# Patient Record
Sex: Female | Born: 1950
Health system: Southern US, Community
[De-identification: ages and names within clinical notes are randomized; demographics above are authoritative.]

## PROBLEM LIST (undated history)

## (undated) DIAGNOSIS — E079 Disorder of thyroid, unspecified: Secondary | ICD-10-CM

## (undated) DIAGNOSIS — I10 Essential (primary) hypertension: Secondary | ICD-10-CM

## (undated) DIAGNOSIS — I499 Cardiac arrhythmia, unspecified: Secondary | ICD-10-CM

## (undated) DIAGNOSIS — N183 Chronic kidney disease, stage 3 unspecified: Secondary | ICD-10-CM

## (undated) DIAGNOSIS — L4050 Arthropathic psoriasis, unspecified: Secondary | ICD-10-CM

## (undated) DIAGNOSIS — M1990 Unspecified osteoarthritis, unspecified site: Secondary | ICD-10-CM

## (undated) DIAGNOSIS — Z8719 Personal history of other diseases of the digestive system: Secondary | ICD-10-CM

## (undated) DIAGNOSIS — Z79899 Other long term (current) drug therapy: Secondary | ICD-10-CM

## (undated) DIAGNOSIS — K649 Unspecified hemorrhoids: Secondary | ICD-10-CM

## (undated) DIAGNOSIS — G709 Myoneural disorder, unspecified: Secondary | ICD-10-CM

## (undated) DIAGNOSIS — G894 Chronic pain syndrome: Secondary | ICD-10-CM

## (undated) DIAGNOSIS — G43909 Migraine, unspecified, not intractable, without status migrainosus: Secondary | ICD-10-CM

## (undated) DIAGNOSIS — R768 Other specified abnormal immunological findings in serum: Secondary | ICD-10-CM

## (undated) DIAGNOSIS — R0609 Other forms of dyspnea: Secondary | ICD-10-CM

## (undated) DIAGNOSIS — H269 Unspecified cataract: Secondary | ICD-10-CM

## (undated) DIAGNOSIS — Z87442 Personal history of urinary calculi: Secondary | ICD-10-CM

## (undated) DIAGNOSIS — I700 Atherosclerosis of aorta: Secondary | ICD-10-CM

## (undated) DIAGNOSIS — E039 Hypothyroidism, unspecified: Secondary | ICD-10-CM

## (undated) DIAGNOSIS — M359 Systemic involvement of connective tissue, unspecified: Secondary | ICD-10-CM

## (undated) DIAGNOSIS — T7840XA Allergy, unspecified, initial encounter: Secondary | ICD-10-CM

## (undated) DIAGNOSIS — J45909 Unspecified asthma, uncomplicated: Secondary | ICD-10-CM

## (undated) DIAGNOSIS — F1190 Opioid use, unspecified, uncomplicated: Secondary | ICD-10-CM

## (undated) DIAGNOSIS — M069 Rheumatoid arthritis, unspecified: Secondary | ICD-10-CM

## (undated) DIAGNOSIS — M109 Gout, unspecified: Secondary | ICD-10-CM

## (undated) DIAGNOSIS — T300 Burn of unspecified body region, unspecified degree: Secondary | ICD-10-CM

## (undated) DIAGNOSIS — E782 Mixed hyperlipidemia: Secondary | ICD-10-CM

## (undated) DIAGNOSIS — D649 Anemia, unspecified: Secondary | ICD-10-CM

## (undated) DIAGNOSIS — Z8489 Family history of other specified conditions: Secondary | ICD-10-CM

## (undated) DIAGNOSIS — Z972 Presence of dental prosthetic device (complete) (partial): Secondary | ICD-10-CM

## (undated) DIAGNOSIS — N189 Chronic kidney disease, unspecified: Secondary | ICD-10-CM

## (undated) DIAGNOSIS — D631 Anemia in chronic kidney disease: Secondary | ICD-10-CM

## (undated) DIAGNOSIS — K08409 Partial loss of teeth, unspecified cause, unspecified class: Secondary | ICD-10-CM

## (undated) DIAGNOSIS — L409 Psoriasis, unspecified: Secondary | ICD-10-CM

## (undated) DIAGNOSIS — T8859XA Other complications of anesthesia, initial encounter: Secondary | ICD-10-CM

## (undated) DIAGNOSIS — K259 Gastric ulcer, unspecified as acute or chronic, without hemorrhage or perforation: Secondary | ICD-10-CM

## (undated) DIAGNOSIS — R7689 Other specified abnormal immunological findings in serum: Secondary | ICD-10-CM

## (undated) DIAGNOSIS — K219 Gastro-esophageal reflux disease without esophagitis: Secondary | ICD-10-CM

## (undated) DIAGNOSIS — Z7960 Long term (current) use of unspecified immunomodulators and immunosuppressants: Secondary | ICD-10-CM

## (undated) DIAGNOSIS — M51369 Other intervertebral disc degeneration, lumbar region without mention of lumbar back pain or lower extremity pain: Secondary | ICD-10-CM

## (undated) DIAGNOSIS — M5136 Other intervertebral disc degeneration, lumbar region: Secondary | ICD-10-CM

## (undated) DIAGNOSIS — H409 Unspecified glaucoma: Secondary | ICD-10-CM

## (undated) DIAGNOSIS — R0600 Dyspnea, unspecified: Secondary | ICD-10-CM

## (undated) HISTORY — PX: COLONOSCOPY: SHX174

## (undated) HISTORY — PX: ANKLE SURGERY: SHX546

## (undated) HISTORY — PX: JOINT REPLACEMENT: SHX530

## (undated) HISTORY — DX: Gastro-esophageal reflux disease without esophagitis: K21.9

## (undated) HISTORY — DX: Unspecified hemorrhoids: K64.9

## (undated) HISTORY — DX: Anemia, unspecified: D64.9

## (undated) HISTORY — PX: ROTATOR CUFF REPAIR: SHX139

## (undated) HISTORY — PX: TONSILLECTOMY: SUR1361

## (undated) HISTORY — DX: Partial loss of teeth, unspecified cause, unspecified class: K08.409

## (undated) HISTORY — DX: Burn of unspecified body region, unspecified degree: T30.0

## (undated) HISTORY — DX: Disorder of thyroid, unspecified: E07.9

## (undated) HISTORY — DX: Unspecified osteoarthritis, unspecified site: M19.90

## (undated) HISTORY — DX: Unspecified glaucoma: H40.9

## (undated) HISTORY — PX: TUBAL LIGATION: SHX77

## (undated) HISTORY — DX: Gastric ulcer, unspecified as acute or chronic, without hemorrhage or perforation: K25.9

## (undated) HISTORY — DX: Allergy, unspecified, initial encounter: T78.40XA

## (undated) HISTORY — DX: Unspecified cataract: H26.9

## (undated) HISTORY — PX: CARPAL TUNNEL RELEASE: SHX101

## (undated) HISTORY — PX: UPPER GI ENDOSCOPY: SHX6162

## (undated) MED FILL — Iron Sucrose Inj 20 MG/ML (Fe Equiv): INTRAVENOUS | Qty: 10 | Status: AC

---

## 1993-02-24 HISTORY — PX: ROTATOR CUFF REPAIR: SHX139

## 2003-08-17 ENCOUNTER — Other Ambulatory Visit: Payer: Self-pay

## 2004-03-05 ENCOUNTER — Emergency Department: Payer: Self-pay | Admitting: General Practice

## 2004-10-17 ENCOUNTER — Ambulatory Visit: Payer: Self-pay | Admitting: Internal Medicine

## 2005-03-08 ENCOUNTER — Emergency Department: Payer: Self-pay | Admitting: Emergency Medicine

## 2006-03-05 ENCOUNTER — Ambulatory Visit: Payer: Self-pay | Admitting: Internal Medicine

## 2006-12-01 ENCOUNTER — Ambulatory Visit: Payer: Self-pay | Admitting: Unknown Physician Specialty

## 2006-12-12 ENCOUNTER — Emergency Department: Payer: Self-pay | Admitting: Emergency Medicine

## 2006-12-12 ENCOUNTER — Other Ambulatory Visit: Payer: Self-pay

## 2007-01-06 ENCOUNTER — Ambulatory Visit: Payer: Self-pay | Admitting: Pain Medicine

## 2007-03-09 ENCOUNTER — Ambulatory Visit: Payer: Self-pay | Admitting: Internal Medicine

## 2007-08-25 ENCOUNTER — Other Ambulatory Visit: Payer: Self-pay

## 2007-08-25 ENCOUNTER — Emergency Department: Payer: Self-pay | Admitting: Emergency Medicine

## 2007-09-20 ENCOUNTER — Ambulatory Visit: Payer: Self-pay | Admitting: Pain Medicine

## 2007-09-23 ENCOUNTER — Ambulatory Visit: Payer: Self-pay | Admitting: Pain Medicine

## 2007-10-07 ENCOUNTER — Ambulatory Visit: Payer: Self-pay | Admitting: Physician Assistant

## 2007-12-27 ENCOUNTER — Ambulatory Visit: Payer: Self-pay | Admitting: Unknown Physician Specialty

## 2008-01-03 ENCOUNTER — Inpatient Hospital Stay: Payer: Self-pay | Admitting: Unknown Physician Specialty

## 2008-01-08 ENCOUNTER — Encounter: Payer: Self-pay | Admitting: Internal Medicine

## 2008-04-19 ENCOUNTER — Ambulatory Visit: Payer: Self-pay | Admitting: Internal Medicine

## 2008-08-13 ENCOUNTER — Emergency Department: Payer: Self-pay | Admitting: Emergency Medicine

## 2009-02-20 ENCOUNTER — Ambulatory Visit: Payer: Self-pay | Admitting: Unknown Physician Specialty

## 2009-05-09 ENCOUNTER — Ambulatory Visit: Payer: Self-pay | Admitting: Internal Medicine

## 2009-06-24 ENCOUNTER — Ambulatory Visit: Payer: Self-pay | Admitting: Unknown Physician Specialty

## 2009-07-03 ENCOUNTER — Ambulatory Visit: Payer: Self-pay | Admitting: Unknown Physician Specialty

## 2009-07-10 ENCOUNTER — Inpatient Hospital Stay: Payer: Self-pay | Admitting: Unknown Physician Specialty

## 2009-08-03 ENCOUNTER — Emergency Department: Payer: Self-pay | Admitting: Unknown Physician Specialty

## 2009-10-26 ENCOUNTER — Ambulatory Visit: Payer: Self-pay | Admitting: Unknown Physician Specialty

## 2010-08-12 ENCOUNTER — Ambulatory Visit: Payer: Self-pay | Admitting: Anesthesiology

## 2010-09-25 ENCOUNTER — Ambulatory Visit: Payer: Self-pay | Admitting: Internal Medicine

## 2010-11-11 ENCOUNTER — Ambulatory Visit: Payer: Self-pay | Admitting: Podiatry

## 2010-12-26 ENCOUNTER — Ambulatory Visit: Payer: Self-pay | Admitting: Unknown Physician Specialty

## 2010-12-26 DIAGNOSIS — I10 Essential (primary) hypertension: Secondary | ICD-10-CM

## 2011-01-02 ENCOUNTER — Inpatient Hospital Stay: Payer: Self-pay | Admitting: Unknown Physician Specialty

## 2011-05-09 ENCOUNTER — Ambulatory Visit: Payer: Self-pay | Admitting: Unknown Physician Specialty

## 2012-01-05 ENCOUNTER — Ambulatory Visit: Payer: Self-pay | Admitting: Podiatry

## 2012-01-26 ENCOUNTER — Ambulatory Visit: Payer: Self-pay | Admitting: Podiatry

## 2012-01-26 LAB — CBC WITH DIFFERENTIAL/PLATELET
Basophil %: 0.7 %
Eosinophil #: 0.1 10*3/uL (ref 0.0–0.7)
Eosinophil %: 2.8 %
HCT: 37.6 % (ref 35.0–47.0)
HGB: 12.6 g/dL (ref 12.0–16.0)
Lymphocyte #: 1.8 10*3/uL (ref 1.0–3.6)
MCH: 28.7 pg (ref 26.0–34.0)
MCV: 86 fL (ref 80–100)
Monocyte #: 0.3 x10 3/mm (ref 0.2–0.9)
Neutrophil %: 56.1 %
RBC: 4.38 10*6/uL (ref 3.80–5.20)
RDW: 13.7 % (ref 11.5–14.5)

## 2012-01-26 LAB — BASIC METABOLIC PANEL
Anion Gap: 6 — ABNORMAL LOW (ref 7–16)
Calcium, Total: 9.1 mg/dL (ref 8.5–10.1)
Chloride: 107 mmol/L (ref 98–107)
Co2: 29 mmol/L (ref 21–32)
Osmolality: 285 (ref 275–301)
Potassium: 3.1 mmol/L — ABNORMAL LOW (ref 3.5–5.1)

## 2012-02-02 LAB — POTASSIUM: Potassium: 3.8 mmol/L (ref 3.5–5.1)

## 2012-02-03 ENCOUNTER — Inpatient Hospital Stay: Payer: Self-pay | Admitting: Podiatry

## 2012-02-03 LAB — URINALYSIS, COMPLETE
Bilirubin,UR: NEGATIVE
Blood: NEGATIVE
Glucose,UR: NEGATIVE mg/dL (ref 0–75)
Ketone: NEGATIVE
Nitrite: POSITIVE
Ph: 5 (ref 4.5–8.0)
Protein: NEGATIVE
RBC,UR: 3 /HPF (ref 0–5)
Specific Gravity: 1.015 (ref 1.003–1.030)
Squamous Epithelial: 14
WBC UR: 74 /HPF (ref 0–5)

## 2012-02-04 LAB — CREATININE, SERUM
Creatinine: 0.98 mg/dL (ref 0.60–1.30)
EGFR (African American): >60
EGFR (Non-African Amer.): >60

## 2012-02-04 LAB — PATHOLOGY REPORT

## 2012-08-23 ENCOUNTER — Ambulatory Visit: Payer: Self-pay | Admitting: Internal Medicine

## 2013-06-09 ENCOUNTER — Ambulatory Visit: Payer: Self-pay | Admitting: Emergency Medicine

## 2013-12-27 ENCOUNTER — Emergency Department: Payer: Self-pay | Admitting: Emergency Medicine

## 2013-12-27 LAB — COMPREHENSIVE METABOLIC PANEL
ALBUMIN: 3.3 g/dL — AB (ref 3.4–5.0)
ALK PHOS: 91 U/L
ANION GAP: 6 — AB (ref 7–16)
AST: 21 U/L (ref 15–37)
BUN: 25 mg/dL — ABNORMAL HIGH (ref 7–18)
Bilirubin,Total: 0.3 mg/dL (ref 0.2–1.0)
Calcium, Total: 8.9 mg/dL (ref 8.5–10.1)
Chloride: 107 mmol/L (ref 98–107)
Co2: 29 mmol/L (ref 21–32)
Creatinine: 1.63 mg/dL — ABNORMAL HIGH (ref 0.60–1.30)
EGFR (African American): 41 — ABNORMAL LOW
EGFR (Non-African Amer.): 34 — ABNORMAL LOW
Glucose: 107 mg/dL — ABNORMAL HIGH (ref 65–99)
Osmolality: 288 (ref 275–301)
Potassium: 3.8 mmol/L (ref 3.5–5.1)
SGPT (ALT): 19 U/L
Sodium: 142 mmol/L (ref 136–145)
Total Protein: 7.2 g/dL (ref 6.4–8.2)

## 2013-12-27 LAB — URINALYSIS, COMPLETE
Bilirubin,UR: NEGATIVE
Blood: NEGATIVE
Glucose,UR: NEGATIVE mg/dL (ref 0–75)
Ketone: NEGATIVE
NITRITE: NEGATIVE
Ph: 6 (ref 4.5–8.0)
Protein: NEGATIVE
RBC,UR: 1 /HPF (ref 0–5)
SPECIFIC GRAVITY: 1.008 (ref 1.003–1.030)

## 2013-12-27 LAB — CBC
HCT: 29.5 % — AB (ref 35.0–47.0)
HGB: 9.5 g/dL — AB (ref 12.0–16.0)
MCH: 27.8 pg (ref 26.0–34.0)
MCHC: 32.4 g/dL (ref 32.0–36.0)
MCV: 86 fL (ref 80–100)
Platelet: 214 10*3/uL (ref 150–440)
RBC: 3.43 10*6/uL — AB (ref 3.80–5.20)
RDW: 13.6 % (ref 11.5–14.5)
WBC: 6.8 10*3/uL (ref 3.6–11.0)

## 2013-12-27 LAB — TROPONIN I: Troponin-I: <0.02 ng/mL

## 2013-12-27 LAB — D-DIMER(ARMC): D-DIMER: 551 ng/mL

## 2013-12-27 LAB — MAGNESIUM: MAGNESIUM: 2.2 mg/dL

## 2013-12-27 LAB — CK TOTAL AND CKMB (NOT AT ARMC)
CK, Total: 154 U/L
CK-MB: 0.8 ng/mL (ref 0.5–3.6)

## 2013-12-28 DIAGNOSIS — R0609 Other forms of dyspnea: Secondary | ICD-10-CM | POA: Insufficient documentation

## 2014-01-13 DIAGNOSIS — I5030 Unspecified diastolic (congestive) heart failure: Secondary | ICD-10-CM

## 2014-01-13 HISTORY — DX: Unspecified diastolic (congestive) heart failure: I50.30

## 2014-04-07 ENCOUNTER — Emergency Department: Payer: Self-pay | Admitting: Emergency Medicine

## 2014-04-11 ENCOUNTER — Encounter: Payer: Self-pay | Admitting: *Deleted

## 2014-04-19 ENCOUNTER — Encounter: Payer: Self-pay | Admitting: General Surgery

## 2014-04-19 ENCOUNTER — Ambulatory Visit (INDEPENDENT_AMBULATORY_CARE_PROVIDER_SITE_OTHER): Payer: Medicare Other | Admitting: General Surgery

## 2014-04-19 ENCOUNTER — Other Ambulatory Visit: Payer: Self-pay | Admitting: General Surgery

## 2014-04-19 VITALS — BP 120/80 | HR 72 | Resp 14 | Ht 72.0 in | Wt 269.0 lb

## 2014-04-19 DIAGNOSIS — R1310 Dysphagia, unspecified: Secondary | ICD-10-CM | POA: Diagnosis not present

## 2014-04-19 MED ORDER — METOCLOPRAMIDE HCL 10 MG PO TABS: ORAL_TABLET | ORAL | Status: DC

## 2014-04-19 MED ORDER — POLYETHYLENE GLYCOL 3350 17 GM/SCOOP PO POWD: ORAL | Status: DC

## 2014-04-19 NOTE — Progress Notes (Signed)
Patient ID: Michele Moore, female   DOB: 11-Jul-1950, 64 y.o.   MRN: [id number]  Chief Complaint  Patient presents with  . Other    esophageal stricture    HPI Michele Moore is a 64 y.o. female.  Here today for evaluation of esophageal stricture. She complains that she has had difficulty with swallowing since November 2015. She states she has difficulty with swallowing water and solid foods. She has severe acid reflux that has gotten worse. She has vomiting because the reflux so bad. She states she has had black looking stools and coughing up blood. She has mentioned this to Dr. Brynda Greathouse in regards to this. She has had a couple of passing out episodes with dizziness. The most recent episode was in November 2015. She was sent to the ER to be evaluated.    HPI  Past Medical History  Diagnosis Date  . Arthritis   . Thyroid disease   . Anemia   . GERD (gastroesophageal reflux disease)   . Hemorrhoid   . Multiple gastric ulcers     Past Surgical History  Procedure Laterality Date  . Joint replacement      knee x 3  . Carpal tunnel release      x3  . Rotator cuff repair    . Ankle surgery    . Tubal ligation    . Colonoscopy  2000?  Marland Kitchen Upper gi endoscopy  2000?    Family History  Problem Relation Age of Onset  . COPD Sister   . Cancer Maternal Aunt     breast  . Cancer Maternal Uncle     kidney    Social History History  Substance Use Topics  . Smoking status: Former Smoker    Quit date: 02/24/1993  . Smokeless tobacco: Never Used  . Alcohol Use: No    Allergies  Allergen Reactions  . Penicillins Anaphylaxis  . Vibramycin [Doxycycline Calcium] Rash    Current Outpatient Prescriptions  Medication Sig Dispense Refill  . atenolol (TENORMIN) 100 MG tablet Take 100 mg by mouth daily.   5  . cyclobenzaprine (FLEXERIL) 10 MG tablet Take 10 mg by mouth 3 (three) times daily.   1  . HYDROcodone-acetaminophen (NORCO/VICODIN) 5-325 MG per tablet Take 1 tablet by mouth 3 (three)  times daily.   0  . meloxicam (MOBIC) 15 MG tablet Take 15 mg by mouth daily.   3  . omega-3 acid ethyl esters (LOVAZA) 1 G capsule Take 2 g by mouth 2 (two) times daily.   5  . omeprazole (PRILOSEC) 40 MG capsule Take 40 mg by mouth 2 (two) times daily.   0  . polyethylene glycol powder (GLYCOLAX/MIRALAX) powder   3  . potassium chloride SA (K-DUR,KLOR-CON) 20 MEQ tablet Take 40 mEq by mouth daily.   5  . ranitidine (ZANTAC) 150 MG tablet Take 150 mg by mouth 2 (two) times daily.   4  . SYNTHROID 150 MCG tablet Take 150 mcg by mouth daily before breakfast.   5  . triamterene-hydrochlorothiazide (MAXZIDE) 75-50 MG per tablet Take 1 tablet by mouth daily.   4   No current facility-administered medications for this visit.    Review of Systems Review of Systems  Constitutional: Negative.   Respiratory: Positive for cough.   Gastrointestinal: Positive for nausea and vomiting.    Blood pressure 120/80, pulse 72, resp. rate 14, height 6' (1.829 m), weight 269 lb (122.018 kg).  Physical Exam Physical Exam  Constitutional:  She is oriented to person, place, and time. She appears well-developed and well-nourished.  Eyes: Conjunctivae are normal. No scleral icterus.  Neck: Neck supple. No thyromegaly present.  Cardiovascular: Normal rate, regular rhythm and normal heart sounds.   No murmur heard. Pulmonary/Chest: Effort normal and breath sounds normal.  Abdominal: Soft. Bowel sounds are normal.  Lymphadenopathy:    She has no cervical adenopathy.  Neurological: She is alert and oriented to person, place, and time.  Skin: Skin is warm and dry.    Data Reviewed  Dr. Jerene Dilling notes.   Assessment    Dysphagia. Complain of black stools.     Plan    Patient to be scheduled for an upper endoscopy and colonoscopy. Patient given hemoccult cards to complete and return to our office.     Patient has been scheduled for a colonoscopy on 05-02-14 at [facility]. This patient has been asked to  discontinue fish oil one week prior to procedure.   Terence Bart G 04/19/2014, 3:48 PM

## 2014-04-19 NOTE — Patient Instructions (Addendum)
Patient given hemoccult cards to complete and return to our office. She is also going to be arranged for a colonoscopy and upper endoscopy. The patient is aware to call back for any questions or concerns.    Colonoscopy A colonoscopy is an exam to look at the entire large intestine (colon). This exam can help find problems such as tumors, polyps, inflammation, and areas of bleeding. The exam takes about 1 hour.  LET [facility] CARE PROVIDER KNOW ABOUT:   Any allergies you have.  All medicines you are taking, including vitamins, herbs, eye drops, creams, and over-the-counter medicines.  Previous problems you or members of your family have had with the use of anesthetics.  Any blood disorders you have.  Previous surgeries you have had.  Medical conditions you have. RISKS AND COMPLICATIONS  Generally, this is a safe procedure. However, as with any procedure, complications can occur. Possible complications include:  Bleeding.  Tearing or rupture of the colon wall.  Reaction to medicines given during the exam.  Infection (rare). BEFORE THE PROCEDURE   Ask your health care provider about changing or stopping your regular medicines.  You may be prescribed an oral bowel prep. This involves drinking a large amount of medicated liquid, starting the day before your procedure. The liquid will cause you to have multiple loose stools until your stool is almost clear or light green. This cleans out your colon in preparation for the procedure.  Do not eat or drink anything else once you have started the bowel prep, unless your health care provider tells you it is safe to do so.  Arrange for someone to drive you home after the procedure. PROCEDURE   You will be given medicine to help you relax (sedative).  You will lie on your side with your knees bent.  A long, flexible tube with a light and camera on the end (colonoscope) will be inserted through the rectum and into the colon. The  camera sends video back to a computer screen as it moves through the colon. The colonoscope also releases carbon dioxide gas to inflate the colon. This helps your health care provider see the area better.  During the exam, your health care provider may take a small tissue sample (biopsy) to be examined under a microscope if any abnormalities are found.  The exam is finished when the entire colon has been viewed. AFTER THE PROCEDURE   Do not drive for 24 hours after the exam.  You may have a small amount of blood in your stool.  You may pass moderate amounts of gas and have mild abdominal cramping or bloating. This is caused by the gas used to inflate your colon during the exam.  Ask when your test results will be ready and how you will get your results. Make sure you get your test results. Document Released: 02/08/2000 Document Revised: 12/01/2012 Document Reviewed: 10/18/2012 [employer] [facility] Patient Information 2015 [place], [place]. This information is not intended to replace advice given to you by your health care provider. Make sure you discuss any questions you have with your health care provider.  Patient has been scheduled for a colonoscopy on 05-02-14 at [facility]. This patient has been asked to discontinue fish oil one week prior to procedure.

## 2014-04-27 ENCOUNTER — Ambulatory Visit (INDEPENDENT_AMBULATORY_CARE_PROVIDER_SITE_OTHER): Payer: Medicare Other | Admitting: *Deleted

## 2014-04-27 DIAGNOSIS — K921 Melena: Secondary | ICD-10-CM

## 2014-04-27 LAB — POC HEMOCCULT BLD/STL (HOME/3-CARD/SCREEN)
Card #2 Fecal Occult Blod, POC: NEGATIVE
Card #3 Fecal Occult Blood, POC: POSITIVE
Fecal Occult Blood, POC: NEGATIVE

## 2014-04-27 NOTE — Progress Notes (Signed)
Stool dated 04-26-14 positive other stools negative, Dr Jamal Collin aware.

## 2014-04-27 NOTE — Patient Instructions (Signed)
As scheduled

## 2014-04-28 ENCOUNTER — Telehealth: Payer: Self-pay

## 2014-04-28 NOTE — Telephone Encounter (Signed)
Patient called and states that she has a very bad cold and sore throat. She is rescheduling her upper and lower endoscopy at [facility] to 05/16/14. She is aware to pre register with the hospital at least 2 days prior. She will be seeing her PCP on 05/02/14 for her cold and sore throat symptoms. Patient is aware of date and all instructions.

## 2014-05-16 ENCOUNTER — Ambulatory Visit: Payer: Self-pay | Admitting: General Surgery

## 2014-05-16 DIAGNOSIS — Z1211 Encounter for screening for malignant neoplasm of colon: Secondary | ICD-10-CM

## 2014-05-16 DIAGNOSIS — R1310 Dysphagia, unspecified: Secondary | ICD-10-CM | POA: Diagnosis not present

## 2014-05-16 DIAGNOSIS — K259 Gastric ulcer, unspecified as acute or chronic, without hemorrhage or perforation: Secondary | ICD-10-CM | POA: Diagnosis not present

## 2014-05-17 ENCOUNTER — Encounter: Payer: Self-pay | Admitting: General Surgery

## 2014-05-22 ENCOUNTER — Encounter: Payer: Self-pay | Admitting: General Surgery

## 2014-06-13 NOTE — Op Note (Signed)
PATIENT NAME:  Michele Moore, Michele Moore MR#:  [id number] DATE OF BIRTH:  Jul 07, 1950  DATE OF PROCEDURE:  02/02/2012  PREOPERATIVE DIAGNOSIS: Right posterior tibial tendon rupture.   POSTOPERATIVE DIAGNOSIS: Right posterior tibial tendon rupture.  PROCEDURES:  1. Subtalar joint fusion, right foot.  2. Talonavicular joint fusion, right foot.   SURGEON: Morrison Masser A. Vickki Muff, DPM.   ANESTHESIA: General with popliteal block.   SPECIMEN: Ruptured posterior tibial tendon, right foot.   HEMOSTASIS: Tourniquet inflated to 325 mmHg for 120 minutes.   COMPLICATIONS: None.   ESTIMATED BLOOD LOSS: Minimal.   OPERATIVE INDICATIONS: This is a 64 year old female who I have seen in the outpatient clinic with a complaint of painful right foot and ankle. She has undergone MRI that showed rupture over her posterior tibial tendon. We discussed different surgical options and she presents for surgery today for subtalar fusion, flexor tendon transfer, and possible talonavicular joint fusion. The risks, benefits, alternatives, and complications associated with the surgery were discussed with the patient and informed consent has been given.   OPERATIVE PROCEDURE: The patient was brought into the Operating Room and placed on the operating table in the supine position. General intubation was administered by the Anesthesia team after a popliteal block had been placed. The patient was placed in the supine position. The right lower extremity was then prepped and draped in the usual sterile fashion. Attention was directed laterally where after inflation of the tourniquet a lateral incision was made over the subtalar joint region. Sharp and blunt dissection was carried down to the deep layers. The EDB muscle belly was noted and transected away from the subtalar joint. This was then entered. The subtalar joint was then removed of all articular cartilage from the posterior middle and anterior facets. This was then drilled with a 1.5 mm drill  bit. Next, the foot was then placed in rectus position, neutral to the leg, and stabilized with a guidewire for the 80 mm DePuy screw set. Next, an 80 millimeter screw was then placed from the heel with normal technique. Excellent compression was noted. The sinus tarsi area was then filled with DBM bone graft. The skin was then closed in layers with 3-0 Vicryl for the deeper and subcutaneous tissue and a 3-0 nylon for the skin. Attention was then directed to the medial aspect of the ankle where a longitudinal incision was made from just around the medial ankle coursing to the TM joint. Sharp and blunt dissection was carried down to the posterior tibial tendon sheath. This was then entered. There was noted to be severe destruction of the posterior tibial tendon at its insertion. Deep to this, the spring ligament was basically completely destroyed with no fragments remaining to suture back. At this point, I elected to excise the posterior tibial tendon and perform a talonavicular fusion. The joint was then prepped with the use of curettes, was burred with a power bur, and then drilled with a 1.5 mm drill bit down to good healthy bleeding bone. A 4.0 mm screw was placed across the tympanic membrane joint initially. A second 18 mm staple was then placed in the central dorsal portion for further compression and stability. This was noted to be quite stable. A little bit of remaining bone graft was placed around the joint, though the joint was quite tight at this time. The wound was flushed with copious amounts of irrigation. Layered closure was performed with a 3-0 Vicryl for the deeper and subcutaneous tissues and a 3-0 nylon for  the skin. A 0.5% Marcaine block was placed around the ankle and lateral foot. She was placed in a well compressive sterile dressing and will be admitted for 23-hour short stay for evaluation. She will be instructed on nonweightbearing. I will see her in the outpatient clinic upon discharge.    ____________________________ Pete Glatter. Vickki Muff, DPM jaf:cbb D: 02/02/2012 17:29:03 ET T: 02/02/2012 17:35:41 ET JOB#: [id number]  cc: Larkin Ina A. Vickki Muff, DPM, <Dictator> Autrey Human DPM ELECTRONICALLY SIGNED 02/20/2012 9:26

## 2014-06-19 LAB — SURGICAL PATHOLOGY

## 2014-12-21 ENCOUNTER — Other Ambulatory Visit: Payer: Self-pay | Admitting: Unknown Physician Specialty

## 2014-12-21 DIAGNOSIS — M5442 Lumbago with sciatica, left side: Secondary | ICD-10-CM

## 2015-01-04 ENCOUNTER — Ambulatory Visit
Admission: RE | Admit: 2015-01-04 | Discharge: 2015-01-04 | Disposition: A | Discharge status: Home / Self Care | Payer: Medicare Other | Source: Ambulatory Visit | Attending: Unknown Physician Specialty | Admitting: Unknown Physician Specialty

## 2015-01-04 DIAGNOSIS — M5442 Lumbago with sciatica, left side: Secondary | ICD-10-CM

## 2015-01-04 DIAGNOSIS — M4806 Spinal stenosis, lumbar region: Secondary | ICD-10-CM | POA: Diagnosis not present

## 2015-01-04 DIAGNOSIS — G544 Lumbosacral root disorders, not elsewhere classified: Secondary | ICD-10-CM | POA: Insufficient documentation

## 2015-02-21 ENCOUNTER — Emergency Department
Admission: EM | Admit: 2015-02-21 | Discharge: 2015-02-21 | Disposition: A | Discharge status: Home / Self Care | Payer: Medicare Other | Attending: Emergency Medicine | Admitting: Emergency Medicine

## 2015-02-21 ENCOUNTER — Encounter: Payer: Self-pay | Admitting: *Deleted

## 2015-02-21 ENCOUNTER — Emergency Department: Payer: Medicare Other

## 2015-02-21 DIAGNOSIS — Z79899 Other long term (current) drug therapy: Secondary | ICD-10-CM | POA: Insufficient documentation

## 2015-02-21 DIAGNOSIS — S79912A Unspecified injury of left hip, initial encounter: Secondary | ICD-10-CM | POA: Diagnosis not present

## 2015-02-21 DIAGNOSIS — S3992XA Unspecified injury of lower back, initial encounter: Secondary | ICD-10-CM | POA: Diagnosis present

## 2015-02-21 DIAGNOSIS — Y998 Other external cause status: Secondary | ICD-10-CM | POA: Insufficient documentation

## 2015-02-21 DIAGNOSIS — Z880 Allergy status to penicillin: Secondary | ICD-10-CM | POA: Diagnosis not present

## 2015-02-21 DIAGNOSIS — M5442 Lumbago with sciatica, left side: Secondary | ICD-10-CM | POA: Insufficient documentation

## 2015-02-21 DIAGNOSIS — Y9289 Other specified places as the place of occurrence of the external cause: Secondary | ICD-10-CM | POA: Insufficient documentation

## 2015-02-21 DIAGNOSIS — G8929 Other chronic pain: Secondary | ICD-10-CM | POA: Diagnosis not present

## 2015-02-21 DIAGNOSIS — Z87891 Personal history of nicotine dependence: Secondary | ICD-10-CM | POA: Diagnosis not present

## 2015-02-21 DIAGNOSIS — W010XXA Fall on same level from slipping, tripping and stumbling without subsequent striking against object, initial encounter: Secondary | ICD-10-CM | POA: Diagnosis not present

## 2015-02-21 DIAGNOSIS — I10 Essential (primary) hypertension: Secondary | ICD-10-CM | POA: Insufficient documentation

## 2015-02-21 DIAGNOSIS — Y9301 Activity, walking, marching and hiking: Secondary | ICD-10-CM | POA: Insufficient documentation

## 2015-02-21 DIAGNOSIS — W19XXXA Unspecified fall, initial encounter: Secondary | ICD-10-CM

## 2015-02-21 HISTORY — DX: Essential (primary) hypertension: I10

## 2015-02-21 MED ORDER — MELOXICAM 15 MG PO TABS: 15.0000 mg | ORAL_TABLET | Freq: Every day | ORAL | Status: DC

## 2015-02-21 MED ORDER — OXYCODONE-ACETAMINOPHEN 5-325 MG PO TABS
1.0000 | ORAL_TABLET | Freq: Once | ORAL | Status: AC
Start: 2015-02-21 — End: 2015-02-21
  Administered 2015-02-21: 1 via ORAL
  Filled 2015-02-21: qty 1

## 2015-02-21 MED ORDER — OXYCODONE-ACETAMINOPHEN 10-325 MG PO TABS: 1.0000 | ORAL_TABLET | Freq: Four times a day (QID) | ORAL | Status: DC | PRN

## 2015-02-21 NOTE — ED Notes (Signed)
Pt has chronic back pain, pt reports falling , pt complaining of left hip pain

## 2015-02-21 NOTE — Discharge Instructions (Signed)
Chronic Back Pain  When back pain lasts longer than 3 months, it is called chronic back pain.People with chronic back pain often go through certain periods that are more intense (flare-ups).  CAUSES Chronic back pain can be caused by wear and tear (degeneration) on different structures in your back. These structures include:  The bones of your spine (vertebrae) and the joints surrounding your spinal cord and nerve roots (facets).  The strong, fibrous tissues that connect your vertebrae (ligaments). Degeneration of these structures may result in pressure on your nerves. This can lead to constant pain. HOME CARE INSTRUCTIONS  Avoid bending, heavy lifting, prolonged sitting, and activities which make the problem worse.  Take brief periods of rest throughout the day to reduce your pain. Lying down or standing usually is better than sitting while you are resting.  Take over-the-counter or prescription medicines only as directed by your caregiver. SEEK IMMEDIATE MEDICAL CARE IF:   You have weakness or numbness in one of your legs or feet.  You have trouble controlling your bladder or bowels.  You have nausea, vomiting, abdominal pain, shortness of breath, or fainting.   This information is not intended to replace advice given to you by your health care provider. Make sure you discuss any questions you have with your health care provider.   Document Released: 03/20/2004 Document Revised: 05/05/2011 Document Reviewed: 07/31/2014 Elsevier Interactive Patient Education 2016 [employer]  Radicular Pain Radicular pain in either the arm or leg is usually from a bulging or herniated disk in the spine. A piece of the herniated disk may press against the nerves as the nerves exit the spine. This causes pain which is felt at the tips of the nerves down the arm or leg. Other causes of radicular pain may include:  Fractures.  Heart disease.  Cancer.  An abnormal and usually degenerative state  of the nervous system or nerves (neuropathy). Diagnosis may require CT or MRI scanning to determine the primary cause.  Nerves that start at the neck (nerve roots) may cause radicular pain in the outer shoulder and arm. It can spread down to the thumb and fingers. The symptoms vary depending on which nerve root has been affected. In most cases radicular pain improves with conservative treatment. Neck problems may require physical therapy, a neck collar, or cervical traction. Treatment may take many weeks, and surgery may be considered if the symptoms do not improve.  Conservative treatment is also recommended for sciatica. Sciatica causes pain to radiate from the lower back or buttock area down the leg into the foot. Often there is a history of back problems. Most patients with sciatica are better after 2 to 4 weeks of rest and other supportive care. Short term bed rest can reduce the disk pressure considerably. Sitting, however, is not a good position since this increases the pressure on the disk. You should avoid bending, lifting, and all other activities which make the problem worse. Traction can be used in severe cases. Surgery is usually reserved for patients who do not improve within the first months of treatment. Only take over-the-counter or prescription medicines for pain, discomfort, or fever as directed by your caregiver. Narcotics and muscle relaxants may help by relieving more severe pain and spasm and by providing mild sedation. Cold or massage can give significant relief. Spinal manipulation is not recommended. It can increase the degree of disc protrusion. Epidural steroid injections are often effective treatment for radicular pain. These injections deliver medicine to the spinal  nerve in the space between the protective covering of the spinal cord and back bones (vertebrae). Your caregiver can give you more information about steroid injections. These injections are most effective when given  within two weeks of the onset of pain.  You should see your caregiver for follow up care as recommended. A program for neck and back injury rehabilitation with stretching and strengthening exercises is an important part of management.  SEEK IMMEDIATE MEDICAL CARE IF:  You develop increased pain, weakness, or numbness in your arm or leg.  You develop difficulty with bladder or bowel control.  You develop abdominal pain.   This information is not intended to replace advice given to you by your health care provider. Make sure you discuss any questions you have with your health care provider.   Document Released: 03/20/2004 Document Revised: 03/03/2014 Document Reviewed: 09/06/2014 Elsevier Interactive Patient Education [employer].

## 2015-02-21 NOTE — ED Provider Notes (Signed)
[facility] Emergency Department Provider Note  ____________________________________________  Time seen: Approximately 6:48 PM  I have reviewed the triage vital signs and the nursing notes.   HISTORY  Chief Complaint Back Pain    HPI Michele Moore is a 64 y.o. female who presents emergency department complaining of lower back and left hip pain. Patient states that she has chronic back pain from a bulging disc. She states that 2 days prior she was walking, slipped, fell landing on her lower back and left hip region.Patient states that the pain is sharp in her lower back any burning sensation onto the left hip. She denies any paresthesias, saddle anesthesia, bowel or bladder dysfunction. She does endorse some mild numbness to the left foot. She did not hit her head or lose consciousness. She is not complaining of anything else at this time. Pain is constant, moderate to severe, and a sharp and burning sensation.   Past Medical History  Diagnosis Date  . Arthritis   . Thyroid disease   . Anemia   . GERD (gastroesophageal reflux disease)   . Hemorrhoid   . Multiple gastric ulcers   . Hypertension     There are no active problems to display for this patient.   Past Surgical History  Procedure Laterality Date  . Joint replacement      knee x 3  . Carpal tunnel release      x3  . Rotator cuff repair    . Ankle surgery    . Tubal ligation    . Colonoscopy  2000?  Marland Kitchen Upper gi endoscopy  2000?    Current Outpatient Rx  Name  Route  Sig  Dispense  Refill  . atenolol (TENORMIN) 100 MG tablet   Oral   Take 100 mg by mouth daily.       5   . cyclobenzaprine (FLEXERIL) 10 MG tablet   Oral   Take 10 mg by mouth 3 (three) times daily.       1   . HYDROcodone-acetaminophen (NORCO/VICODIN) 5-325 MG per tablet   Oral   Take 1 tablet by mouth 3 (three) times daily.       0   . meloxicam (MOBIC) 15 MG tablet   Oral   Take 1 tablet (15 mg total) by  mouth daily.   30 tablet   0   . metoCLOPramide (REGLAN) 10 MG tablet      TAKE 1 TABLET BY MOUTH ON HOUR PRIOR TO THE START OF COLONOSCOPY PREP AND 1 TABLET 3-4 HOURS LATER   2 tablet   0   . omega-3 acid ethyl esters (LOVAZA) 1 G capsule   Oral   Take 2 g by mouth 2 (two) times daily.       5   . omeprazole (PRILOSEC) 40 MG capsule   Oral   Take 40 mg by mouth 2 (two) times daily.       0   . oxyCODONE-acetaminophen (PERCOCET) 10-325 MG tablet   Oral   Take 1 tablet by mouth every 6 (six) hours as needed for pain.   20 tablet   0   . polyethylene glycol powder (GLYCOLAX/MIRALAX) powder            3   . polyethylene glycol powder (GLYCOLAX/MIRALAX) powder      255 grams one bottle for colonoscopy prep   255 g   0   . potassium chloride SA (K-DUR,KLOR-CON) 20 MEQ tablet  Oral   Take 40 mEq by mouth daily.       5   . ranitidine (ZANTAC) 150 MG tablet   Oral   Take 150 mg by mouth 2 (two) times daily.       4   . SYNTHROID 150 MCG tablet   Oral   Take 150 mcg by mouth daily before breakfast.       5     Dispense as written.   . triamterene-hydrochlorothiazide (MAXZIDE) 75-50 MG per tablet   Oral   Take 1 tablet by mouth daily.       4     Allergies Penicillins and Vibramycin  Family History  Problem Relation Age of Onset  . COPD Sister   . Cancer Maternal Aunt     breast  . Cancer Maternal Uncle     kidney    Social History Social History  Substance Use Topics  . Smoking status: Former Smoker    Quit date: 02/24/1993  . Smokeless tobacco: Never Used  . Alcohol Use: No    Review of Systems Constitutional: No fever/chills Eyes: No visual changes. ENT: No sore throat. Cardiovascular: Denies chest pain. Respiratory: Denies shortness of breath. Gastrointestinal: No abdominal pain.  No nausea, no vomiting.  No diarrhea.  No constipation. Genitourinary: Negative for dysuria. Musculoskeletal: Endorses left-sided lower back pain  and left hip pain. Skin: Negative for rash. Neurological: Negative for headaches, focal weakness or numbness.  10-point ROS otherwise negative.  ____________________________________________   PHYSICAL EXAM:  VITAL SIGNS: ED Triage Vitals  Enc Vitals Group     BP 02/21/15 1735 130/65 mmHg     Pulse Rate 02/21/15 1735 97     Resp 02/21/15 1735 20     Temp 02/21/15 1735 98.4 F (36.9 C)     Temp Source 02/21/15 1735 Oral     SpO2 02/21/15 1735 97 %     Weight 02/21/15 1735 258 lb (117.028 kg)     Height 02/21/15 1735 5\' 11"  (1.803 m)     Head Cir --      Peak Flow --      Pain Score 02/21/15 1736 10     Pain Loc --      Pain Edu? --      Excl. in [place]? --     Constitutional: Alert and oriented. Well appearing and in no acute distress. Eyes: Conjunctivae are normal. PERRL. EOMI. Head: Atraumatic. Nose: No congestion/rhinnorhea. Mouth/Throat: Mucous membranes are moist.  Oropharynx non-erythematous. Neck: No stridor.  No cervical spine tenderness to palpation. Cardiovascular: Normal rate, regular rhythm. Grossly normal heart sounds.  Good peripheral circulation. Respiratory: Normal respiratory effort.  No retractions. Lungs CTAB. Gastrointestinal: Soft and nontender. No distention. No abdominal bruits. No CVA tenderness. Musculoskeletal: No lower extremity tenderness nor edema.  No joint effusions. No visible abnormality to the lower back or left hip upon inspection. Patient is diffusely tender to palpation midline cervical processes and lumbar region. Patient is tender to palpation over the paraspinal muscle group in the lumbar region. There is pain to palpation over the sciatic notch. Patient does have full range of motion to left hip. Tender to palpation over the trochanteric region. Examination of left knee is unremarkable. Sensation and pulses are intact distally. Neurologic:  Normal speech and language. No gross focal neurologic deficits are appreciated. No gait  instability. Skin:  Skin is warm, dry and intact. No rash noted. Psychiatric: Mood and affect are normal. Speech and behavior are  normal.  ____________________________________________   LABS (all labs ordered are listed, but only abnormal results are displayed)  Labs Reviewed - No data to display ____________________________________________  EKG   ____________________________________________  RADIOLOGY  Lumbar spine x-ray Impression: No evidence of acute osseous abnormality. Lower lumbar spine degenerative changes.  Left hip x-ray Impression: Mild symmetric degenerative changes in the hips. No acute osseous abnormality.   Images were personally reviewed by myself. ____________________________________________   PROCEDURES  Procedure(s) performed: None  Critical Care performed: No  ____________________________________________   INITIAL IMPRESSION / ASSESSMENT AND PLAN / ED COURSE  Pertinent labs & imaging results that were available during my care of the patient were reviewed by me and considered in my medical decision making (see chart for details).  Patient presents emergency department status post a fall. X-rays are unremarkable for any acute osseous abnormality. Patient's nurses is consistent with lumbago with sciatica to left side that is exacerbated by this fall. Patient will be discharged home with pain medication and anti-inflammatories for symptom control. She is to follow-up with Dr. Prescott Parma for her regularly scheduled appointment in January for epidural steroid injection. She verbalizes understanding of diagnosis and treatment plan and verbalizes compliance with same. ____________________________________________   FINAL CLINICAL IMPRESSION(S) / ED DIAGNOSES  Final diagnoses:  Chronic midline low back pain with left-sided sciatica  Fall, initial encounter      Darletta Moll, PA-C 02/21/15 2005  [name], MD 02/22/15 [phone]

## 2015-03-01 DIAGNOSIS — M5136 Other intervertebral disc degeneration, lumbar region: Secondary | ICD-10-CM | POA: Diagnosis not present

## 2015-03-01 DIAGNOSIS — M5442 Lumbago with sciatica, left side: Secondary | ICD-10-CM | POA: Diagnosis not present

## 2015-03-01 DIAGNOSIS — M4806 Spinal stenosis, lumbar region: Secondary | ICD-10-CM | POA: Diagnosis not present

## 2015-03-01 DIAGNOSIS — M5416 Radiculopathy, lumbar region: Secondary | ICD-10-CM | POA: Diagnosis not present

## 2015-03-26 DIAGNOSIS — M5136 Other intervertebral disc degeneration, lumbar region: Secondary | ICD-10-CM | POA: Diagnosis not present

## 2015-03-26 DIAGNOSIS — M5432 Sciatica, left side: Secondary | ICD-10-CM | POA: Diagnosis not present

## 2015-03-26 DIAGNOSIS — M4806 Spinal stenosis, lumbar region: Secondary | ICD-10-CM | POA: Diagnosis not present

## 2015-03-26 DIAGNOSIS — M5442 Lumbago with sciatica, left side: Secondary | ICD-10-CM | POA: Diagnosis not present

## 2015-03-26 DIAGNOSIS — M25552 Pain in left hip: Secondary | ICD-10-CM | POA: Diagnosis not present

## 2015-03-26 DIAGNOSIS — M5416 Radiculopathy, lumbar region: Secondary | ICD-10-CM | POA: Diagnosis not present

## 2015-03-26 DIAGNOSIS — M545 Low back pain: Secondary | ICD-10-CM | POA: Diagnosis not present

## 2015-03-27 DIAGNOSIS — M5442 Lumbago with sciatica, left side: Secondary | ICD-10-CM | POA: Diagnosis not present

## 2015-03-27 DIAGNOSIS — M6281 Muscle weakness (generalized): Secondary | ICD-10-CM | POA: Diagnosis not present

## 2015-05-01 ENCOUNTER — Ambulatory Visit
Admission: EM | Admit: 2015-05-01 | Discharge: 2015-05-01 | Disposition: A | Discharge status: Home / Self Care | Payer: Medicare Other | Attending: Family Medicine | Admitting: Family Medicine

## 2015-05-01 ENCOUNTER — Ambulatory Visit: Payer: Medicare Other

## 2015-05-01 DIAGNOSIS — I10 Essential (primary) hypertension: Secondary | ICD-10-CM | POA: Diagnosis not present

## 2015-05-01 DIAGNOSIS — R05 Cough: Secondary | ICD-10-CM | POA: Diagnosis not present

## 2015-05-01 DIAGNOSIS — Z880 Allergy status to penicillin: Secondary | ICD-10-CM | POA: Insufficient documentation

## 2015-05-01 DIAGNOSIS — D649 Anemia, unspecified: Secondary | ICD-10-CM | POA: Diagnosis not present

## 2015-05-01 DIAGNOSIS — Z87891 Personal history of nicotine dependence: Secondary | ICD-10-CM | POA: Insufficient documentation

## 2015-05-01 DIAGNOSIS — R059 Cough, unspecified: Secondary | ICD-10-CM

## 2015-05-01 DIAGNOSIS — J189 Pneumonia, unspecified organism: Secondary | ICD-10-CM | POA: Diagnosis not present

## 2015-05-01 DIAGNOSIS — K219 Gastro-esophageal reflux disease without esophagitis: Secondary | ICD-10-CM | POA: Diagnosis not present

## 2015-05-01 DIAGNOSIS — R0602 Shortness of breath: Secondary | ICD-10-CM | POA: Diagnosis not present

## 2015-05-01 DIAGNOSIS — E079 Disorder of thyroid, unspecified: Secondary | ICD-10-CM | POA: Insufficient documentation

## 2015-05-01 DIAGNOSIS — Z7982 Long term (current) use of aspirin: Secondary | ICD-10-CM | POA: Diagnosis not present

## 2015-05-01 DIAGNOSIS — R509 Fever, unspecified: Secondary | ICD-10-CM | POA: Diagnosis not present

## 2015-05-01 DIAGNOSIS — J069 Acute upper respiratory infection, unspecified: Secondary | ICD-10-CM | POA: Diagnosis present

## 2015-05-01 MED ORDER — LEVOFLOXACIN 500 MG PO TABS: 500.0000 mg | ORAL_TABLET | Freq: Every day | ORAL | Status: DC

## 2015-05-01 MED ORDER — FEXOFENADINE-PSEUDOEPHED ER 180-240 MG PO TB24: 1.0000 | ORAL_TABLET | Freq: Every day | ORAL | Status: DC

## 2015-05-01 MED ORDER — HYDROCOD POLST-CPM POLST ER 10-8 MG/5ML PO SUER: 5.0000 mL | Freq: Two times a day (BID) | ORAL | Status: DC | PRN

## 2015-05-01 NOTE — ED Notes (Signed)
States has had sinus drainage x 2 months. In the last week, increased sinus congestion and cough

## 2015-05-01 NOTE — Discharge Instructions (Signed)

## 2015-05-01 NOTE — ED Provider Notes (Signed)
CSN: [id number]     Arrival date & time 05/01/15  0807 History   First MD Initiated Contact with Patient 05/01/15 [phone]    Nurses notes were reviewed.  Chief Complaint  Patient presents with  . URI   Patient reports being sick since over a month ago some rhinorrhea and nasal congestion. She states that the last few days about a week ago she started taking care of her granddaughter who had the flu she started having nasal congestion and cough and sore throat no things is continuing to get worse. She states his blood canal but no blood in the sputum she's coughing feels Ms. [place] lightheadedness he feels dizzy. Past medical history is positive for hypertension gastric ulcers anemia file disease arthritis she's had joint replacements knee 3, total release and rotator cuff repair as well. She is a former smoker she stopped in the mid 83s and she has maternal breast cancer sister COPD and maternal uncle with kidney cancer.      (Consider location/radiation/quality/duration/timing/severity/associated sxs/prior Treatment) Patient is a 65 y.o. female presenting with URI. The history is provided by the patient.  URI Presenting symptoms: congestion, cough, fatigue, rhinorrhea and sore throat   Severity:  Moderate Onset quality:  Sudden Progression:  Worsening Chronicity:  New Worsened by:  Belching Associated symptoms: headaches   Risk factors: being elderly and recent illness     Past Medical History  Diagnosis Date  . Arthritis   . Thyroid disease   . Anemia   . GERD (gastroesophageal reflux disease)   . Hemorrhoid   . Multiple gastric ulcers   . Hypertension    Past Surgical History  Procedure Laterality Date  . Joint replacement      knee x 3  . Carpal tunnel release      x3  . Rotator cuff repair    . Ankle surgery    . Tubal ligation    . Colonoscopy  2000?  Marland Kitchen Upper gi endoscopy  2000?   Family History  Problem Relation Age of Onset  . COPD Sister   . Cancer  Maternal Aunt     breast  . Cancer Maternal Uncle     kidney   Social History  Substance Use Topics  . Smoking status: Former Smoker    Quit date: 02/24/1993  . Smokeless tobacco: Never Used  . Alcohol Use: No   OB History    Gravida Para Term Preterm AB TAB SAB Ectopic Multiple Living   4               Obstetric Comments   1st Menstrual Cycle:  16 1st Pregnancy:  19     Review of Systems  Constitutional: Positive for activity change, appetite change and fatigue.  HENT: Positive for congestion, nosebleeds, postnasal drip, rhinorrhea and sore throat.   Respiratory: Positive for cough.   Neurological: Positive for dizziness and headaches.  All other systems reviewed and are negative.   Allergies  Penicillins and Vibramycin  Home Medications   Prior to Admission medications   Medication Sig Start Date End Date Taking? Authorizing Provider  aspirin 81 MG tablet Take 81 mg by mouth daily.   Yes Historical Provider, MD  cyclobenzaprine (FLEXERIL) 10 MG tablet Take 10 mg by mouth 3 (three) times daily.  02/26/14  Yes Historical Provider, MD  HYDROcodone-acetaminophen (NORCO/VICODIN) 5-325 MG per tablet Take 1 tablet by mouth 3 (three) times daily.  03/30/14  Yes Historical Provider, MD  omega-3 acid ethyl  esters (LOVAZA) 1 G capsule Take 2 g by mouth 2 (two) times daily.  03/09/14  Yes Historical Provider, MD  omeprazole (PRILOSEC) 40 MG capsule Take 40 mg by mouth 2 (two) times daily.  04/07/14  Yes Historical Provider, MD  polyethylene glycol powder (GLYCOLAX/MIRALAX) powder  03/20/14  Yes Historical Provider, MD  potassium chloride SA (K-DUR,KLOR-CON) 20 MEQ tablet Take 40 mEq by mouth daily.  02/14/14  Yes Historical Provider, MD  ranitidine (ZANTAC) 150 MG tablet Take 150 mg by mouth 2 (two) times daily.  03/29/14  Yes Historical Provider, MD  SYNTHROID 150 MCG tablet Take 150 mcg by mouth daily before breakfast.  03/28/14  Yes Historical Provider, MD  tiZANidine (ZANAFLEX) 4 MG  capsule Take 4 mg by mouth 3 (three) times daily.   Yes Historical Provider, MD  triamterene-hydrochlorothiazide (MAXZIDE) 75-50 MG per tablet Take 1 tablet by mouth daily.  03/22/14  Yes Historical Provider, MD  atenolol (TENORMIN) 100 MG tablet Take 100 mg by mouth daily.  02/22/14   Historical Provider, MD  chlorpheniramine-HYDROcodone (TUSSIONEX PENNKINETIC ER) 10-8 MG/5ML SUER Take 5 mLs by mouth every 12 (twelve) hours as needed for cough. 05/01/15   Frederich Cha, MD  fexofenadine-pseudoephedrine (ALLEGRA-D ALLERGY & CONGESTION) 180-240 MG 24 hr tablet Take 1 tablet by mouth daily. 05/01/15   Frederich Cha, MD  levofloxacin (LEVAQUIN) 500 MG tablet Take 1 tablet (500 mg total) by mouth daily. 05/01/15   Frederich Cha, MD  meloxicam (MOBIC) 15 MG tablet Take 1 tablet (15 mg total) by mouth daily. 02/21/15   Charline Bills Cuthriell, PA-C  metoCLOPramide (REGLAN) 10 MG tablet TAKE 1 TABLET BY MOUTH ON HOUR PRIOR TO THE START OF COLONOSCOPY PREP AND 1 TABLET 3-4 HOURS LATER 04/19/14   Seeplaputhur Robinette Haines, MD  oxyCODONE-acetaminophen (PERCOCET) 10-325 MG tablet Take 1 tablet by mouth every 6 (six) hours as needed for pain. 02/21/15 02/21/16  Roderic Palau D Cuthriell, PA-C  polyethylene glycol powder (GLYCOLAX/MIRALAX) powder 255 grams one bottle for colonoscopy prep 04/19/14   Seeplaputhur Robinette Haines, MD   Meds Ordered and Administered this Visit  Medications - No data to display  BP 130/73 mmHg  Pulse 92  Temp(Src) 96.2 F (35.7 C) (Tympanic)  Resp 20  Ht 5\' 11"  (1.803 m)  Wt 255 lb (115.667 kg)  BMI 35.58 kg/m2  SpO2 100% No data found.   Physical Exam  Constitutional: She is oriented to person, place, and time. She appears well-developed and well-nourished.  HENT:  Head: Normocephalic and atraumatic.  Eyes: Conjunctivae are normal. Pupils are equal, round, and reactive to light.  Neck: Normal range of motion. Neck supple.  Cardiovascular: Normal rate, regular rhythm and normal heart sounds.    Pulmonary/Chest: She has wheezes in the right upper field and the right lower field. She has no rales.  Abdominal: Soft.  Musculoskeletal: Normal range of motion. She exhibits no edema.  Neurological: She is alert and oriented to person, place, and time.  Skin: Skin is warm and dry. No erythema.  Vitals reviewed.   ED Course  Procedures (including critical care time)  Labs Review Labs Reviewed - No data to display  Imaging Review Dg Chest 2 View  05/01/2015  CLINICAL DATA:  Cough, fever, shortness of breath, upper chest pain, sore throat EXAM: CHEST  2 VIEW COMPARISON:  12/27/2013 FINDINGS: Borderline cardiomegaly. No pulmonary edema. There is streaky left basilar atelectasis or infiltrate. Mild levoscoliosis upper thoracic spine. Mild degenerative changes thoracic spine. IMPRESSION: No pulmonary edema.  Streaky left basilar atelectasis or early infiltrate. Electronically Signed   By: Lahoma Crocker M.D.   On: 05/01/2015 09:18     Visual Acuity Review  Right Eye Distance:   Left Eye Distance:   Bilateral Distance:    Right Eye Near:   Left Eye Near:    Bilateral Near:         MDM   1. Community acquired pneumonia   2. Cough    Patient informed that she has early pneumonia. Recommend follow-up with Dr. Brynda Greathouse in 2-3 weeks for proof of cure. She does not work note. Discussed about the warning with Tussionex and doxycycline allergies but cannot see anything this currently pertinent with that since she's states she can take hydrocodone. We'll place her on Tussionex 1 teaspoon plus and Allegra-D 24 hours 1 tablet day and Levaquin 501 tablet day for 10 days.  Note: This dictation was prepared with Dragon dictation along with smaller phrase technology. Any transcriptional errors that result from this process are unintentional.  Frederich Cha, MD 05/01/15 1013

## 2015-05-15 DIAGNOSIS — J3081 Allergic rhinitis due to animal (cat) (dog) hair and dander: Secondary | ICD-10-CM | POA: Diagnosis not present

## 2015-06-07 DIAGNOSIS — M5416 Radiculopathy, lumbar region: Secondary | ICD-10-CM | POA: Diagnosis not present

## 2015-06-07 DIAGNOSIS — M4806 Spinal stenosis, lumbar region: Secondary | ICD-10-CM | POA: Diagnosis not present

## 2015-06-14 DIAGNOSIS — M5442 Lumbago with sciatica, left side: Secondary | ICD-10-CM | POA: Diagnosis not present

## 2015-06-14 DIAGNOSIS — M4806 Spinal stenosis, lumbar region: Secondary | ICD-10-CM | POA: Diagnosis not present

## 2015-06-14 DIAGNOSIS — M5416 Radiculopathy, lumbar region: Secondary | ICD-10-CM | POA: Diagnosis not present

## 2015-06-18 DIAGNOSIS — M19071 Primary osteoarthritis, right ankle and foot: Secondary | ICD-10-CM | POA: Diagnosis not present

## 2015-06-18 DIAGNOSIS — M25571 Pain in right ankle and joints of right foot: Secondary | ICD-10-CM | POA: Diagnosis not present

## 2015-06-18 DIAGNOSIS — M7989 Other specified soft tissue disorders: Secondary | ICD-10-CM | POA: Diagnosis not present

## 2015-06-19 DIAGNOSIS — H40013 Open angle with borderline findings, low risk, bilateral: Secondary | ICD-10-CM | POA: Diagnosis not present

## 2015-06-19 DIAGNOSIS — H2513 Age-related nuclear cataract, bilateral: Secondary | ICD-10-CM | POA: Diagnosis not present

## 2015-07-24 DIAGNOSIS — M19071 Primary osteoarthritis, right ankle and foot: Secondary | ICD-10-CM | POA: Diagnosis not present

## 2015-07-25 ENCOUNTER — Inpatient Hospital Stay: Admission: RE | Admit: 2015-07-25 | Payer: Medicare Other | Source: Ambulatory Visit

## 2015-07-26 DIAGNOSIS — I119 Hypertensive heart disease without heart failure: Secondary | ICD-10-CM | POA: Diagnosis not present

## 2015-07-30 ENCOUNTER — Encounter
Admission: RE | Admit: 2015-07-30 | Discharge: 2015-07-30 | Disposition: A | Discharge status: Home / Self Care | Payer: Medicare Other | Source: Ambulatory Visit | Attending: Podiatry | Admitting: Podiatry

## 2015-07-30 DIAGNOSIS — Z01810 Encounter for preprocedural cardiovascular examination: Secondary | ICD-10-CM | POA: Diagnosis not present

## 2015-07-30 DIAGNOSIS — I10 Essential (primary) hypertension: Secondary | ICD-10-CM | POA: Diagnosis not present

## 2015-07-30 DIAGNOSIS — Z01812 Encounter for preprocedural laboratory examination: Secondary | ICD-10-CM | POA: Insufficient documentation

## 2015-07-30 HISTORY — DX: Mixed hyperlipidemia: E78.2

## 2015-07-30 HISTORY — DX: Family history of other specified conditions: Z84.89

## 2015-07-30 HISTORY — DX: Migraine, unspecified, not intractable, without status migrainosus: G43.909

## 2015-07-30 HISTORY — DX: Personal history of other diseases of the digestive system: Z87.19

## 2015-07-30 LAB — CBC
HEMATOCRIT: 31.6 % — AB (ref 35.0–47.0)
HEMOGLOBIN: 10.3 g/dL — AB (ref 12.0–16.0)
MCH: 27.3 pg (ref 26.0–34.0)
MCHC: 32.5 g/dL (ref 32.0–36.0)
MCV: 84.1 fL (ref 80.0–100.0)
Platelets: 166 10*3/uL (ref 150–440)
RBC: 3.75 MIL/uL — ABNORMAL LOW (ref 3.80–5.20)
RDW: 13.9 % (ref 11.5–14.5)
WBC: 4.7 10*3/uL (ref 3.6–11.0)

## 2015-07-30 LAB — BASIC METABOLIC PANEL
Anion gap: 7 (ref 5–15)
BUN: 32 mg/dL — AB (ref 6–20)
CHLORIDE: 107 mmol/L (ref 101–111)
CO2: 27 mmol/L (ref 22–32)
CREATININE: 1.95 mg/dL — AB (ref 0.44–1.00)
Calcium: 9.2 mg/dL (ref 8.9–10.3)
GFR calc Af Amer: 30 mL/min — ABNORMAL LOW (ref >60)
GFR calc non Af Amer: 26 mL/min — ABNORMAL LOW (ref >60)
Glucose, Bld: 89 mg/dL (ref 65–99)
Potassium: 4.4 mmol/L (ref 3.5–5.1)
Sodium: 141 mmol/L (ref 135–145)

## 2015-07-30 NOTE — Patient Instructions (Signed)
Your procedure is scheduled on: Friday 08/03/15 Report to Day Surgery. 2ND FLOOR MEDICAL MALL ENTRANCE To find out your arrival time please call [phone] between 1PM - 3PM on Thursday 08/02/15.  Remember: Instructions that are not followed completely may result in serious medical risk, up to and including death, or upon the discretion of your surgeon and anesthesiologist your surgery may need to be rescheduled.    __X__ 1. Do not eat food or drink liquids after midnight. No gum chewing or hard candies.     __X__ 2. No Alcohol for 24 hours before or after surgery.   ____ 3. Bring all medications with you on the day of surgery if instructed.    __X__ 4. Notify your doctor if there is any change in your medical condition     (cold, fever, infections).     Do not wear jewelry, make-up, hairpins, clips or nail polish.  Do not wear lotions, powders, or perfumes.   Do not shave 48 hours prior to surgery. Men may shave face and neck.  Do not bring valuables to the hospital.    [facility] is not responsible for any belongings or valuables.               Contacts, dentures or bridgework may not be worn into surgery.  Leave your suitcase in the car. After surgery it may be brought to your room.  For patients admitted to the hospital, discharge time is determined by your                treatment team.   Patients discharged the day of surgery will not be allowed to drive home.   Please read over the following fact sheets that you were given:   Surgical Site Infection Prevention   __X__ Take these medicines the morning of surgery with A SIP OF WATER:    1. HYDRALAZINE  2. OMEPRAZOLE  3. RANITADINE  4. SYNTHROID  5.  6.  ____ Fleet Enema (as directed)   __X__ Use CHG Soap as directed  ____ Use inhalers on the day of surgery  ____ Stop metformin 2 days prior to surgery    ____ Take 1/2 of usual insulin dose the night before surgery and none on the morning of surgery.   __X__ Stop  Coumadin/Plavix/aspirin on STOP ASPIRIN TODAY  __X__ Stop Anti-inflammatories on STOP MELOXICAM TODAY   ____ Stop supplements until after surgery.    ____ Bring C-Pap to the hospital.

## 2015-07-30 NOTE — Pre-Procedure Instructions (Signed)
LABS WITH DECREASED KIDNEY FUNCTION,AS INSTRUCTED BY DR P CARROLL, CALLED AND FAXED TO TRACY AT DR EASON'S. ALSO NOTIFIED KIM AT DR Alvera Singh

## 2015-07-30 NOTE — Pre-Procedure Instructions (Signed)
Medical Clearance received from Dr Brynda Greathouse Office, placed on chart.

## 2015-08-01 NOTE — Pre-Procedure Instructions (Signed)
EKG sent to Anesthesia for review. 

## 2015-08-02 ENCOUNTER — Encounter: Payer: Self-pay | Admitting: *Deleted

## 2015-08-02 NOTE — Pre-Procedure Instructions (Signed)
CLEARED BY DR Brynda Greathouse 08/02/15 AFTER REVIEWING LABS

## 2015-08-03 ENCOUNTER — Encounter: Payer: Self-pay | Admitting: *Deleted

## 2015-08-03 ENCOUNTER — Ambulatory Visit: Payer: Medicare Other | Admitting: Anesthesiology

## 2015-08-03 ENCOUNTER — Encounter
Admission: RE | Disposition: A | Discharge status: Skilled Nursing Facility | Payer: Self-pay | Source: Ambulatory Visit | Attending: Podiatry

## 2015-08-03 ENCOUNTER — Inpatient Hospital Stay
Admission: RE | Admit: 2015-08-03 | Discharge: 2015-08-07 | DRG: 504 | Disposition: A | Discharge status: Skilled Nursing Facility | Payer: Medicare Other | Source: Ambulatory Visit | Attending: Podiatry | Admitting: Podiatry

## 2015-08-03 DIAGNOSIS — M25562 Pain in left knee: Secondary | ICD-10-CM | POA: Diagnosis present

## 2015-08-03 DIAGNOSIS — R51 Headache: Secondary | ICD-10-CM | POA: Diagnosis present

## 2015-08-03 DIAGNOSIS — N189 Chronic kidney disease, unspecified: Secondary | ICD-10-CM | POA: Diagnosis not present

## 2015-08-03 DIAGNOSIS — M19071 Primary osteoarthritis, right ankle and foot: Secondary | ICD-10-CM | POA: Diagnosis not present

## 2015-08-03 DIAGNOSIS — Z8711 Personal history of peptic ulcer disease: Secondary | ICD-10-CM

## 2015-08-03 DIAGNOSIS — M6281 Muscle weakness (generalized): Secondary | ICD-10-CM | POA: Diagnosis not present

## 2015-08-03 DIAGNOSIS — I10 Essential (primary) hypertension: Secondary | ICD-10-CM | POA: Diagnosis not present

## 2015-08-03 DIAGNOSIS — R262 Difficulty in walking, not elsewhere classified: Secondary | ICD-10-CM | POA: Diagnosis not present

## 2015-08-03 DIAGNOSIS — Z7982 Long term (current) use of aspirin: Secondary | ICD-10-CM

## 2015-08-03 DIAGNOSIS — M549 Dorsalgia, unspecified: Secondary | ICD-10-CM | POA: Diagnosis present

## 2015-08-03 DIAGNOSIS — E782 Mixed hyperlipidemia: Secondary | ICD-10-CM | POA: Diagnosis present

## 2015-08-03 DIAGNOSIS — Z880 Allergy status to penicillin: Secondary | ICD-10-CM | POA: Diagnosis not present

## 2015-08-03 DIAGNOSIS — I129 Hypertensive chronic kidney disease with stage 1 through stage 4 chronic kidney disease, or unspecified chronic kidney disease: Secondary | ICD-10-CM | POA: Diagnosis present

## 2015-08-03 DIAGNOSIS — Z7401 Bed confinement status: Secondary | ICD-10-CM | POA: Diagnosis not present

## 2015-08-03 DIAGNOSIS — N183 Chronic kidney disease, stage 3 (moderate): Secondary | ICD-10-CM | POA: Diagnosis present

## 2015-08-03 DIAGNOSIS — Z6833 Body mass index (BMI) 33.0-33.9, adult: Secondary | ICD-10-CM

## 2015-08-03 DIAGNOSIS — R6889 Other general symptoms and signs: Secondary | ICD-10-CM | POA: Diagnosis not present

## 2015-08-03 DIAGNOSIS — G8929 Other chronic pain: Secondary | ICD-10-CM | POA: Diagnosis present

## 2015-08-03 DIAGNOSIS — Z79899 Other long term (current) drug therapy: Secondary | ICD-10-CM

## 2015-08-03 DIAGNOSIS — G8918 Other acute postprocedural pain: Secondary | ICD-10-CM | POA: Diagnosis present

## 2015-08-03 DIAGNOSIS — Z96653 Presence of artificial knee joint, bilateral: Secondary | ICD-10-CM | POA: Diagnosis present

## 2015-08-03 DIAGNOSIS — K219 Gastro-esophageal reflux disease without esophagitis: Secondary | ICD-10-CM | POA: Diagnosis present

## 2015-08-03 DIAGNOSIS — E669 Obesity, unspecified: Secondary | ICD-10-CM | POA: Diagnosis present

## 2015-08-03 DIAGNOSIS — E039 Hypothyroidism, unspecified: Secondary | ICD-10-CM | POA: Diagnosis present

## 2015-08-03 DIAGNOSIS — N179 Acute kidney failure, unspecified: Secondary | ICD-10-CM | POA: Diagnosis present

## 2015-08-03 DIAGNOSIS — M79606 Pain in leg, unspecified: Secondary | ICD-10-CM | POA: Diagnosis present

## 2015-08-03 DIAGNOSIS — Z87891 Personal history of nicotine dependence: Secondary | ICD-10-CM | POA: Diagnosis not present

## 2015-08-03 DIAGNOSIS — M069 Rheumatoid arthritis, unspecified: Secondary | ICD-10-CM | POA: Diagnosis not present

## 2015-08-03 DIAGNOSIS — Z825 Family history of asthma and other chronic lower respiratory diseases: Secondary | ICD-10-CM | POA: Diagnosis not present

## 2015-08-03 DIAGNOSIS — M25571 Pain in right ankle and joints of right foot: Secondary | ICD-10-CM | POA: Diagnosis not present

## 2015-08-03 DIAGNOSIS — Q6689 Other  specified congenital deformities of feet: Secondary | ICD-10-CM | POA: Diagnosis not present

## 2015-08-03 DIAGNOSIS — Z96652 Presence of left artificial knee joint: Secondary | ICD-10-CM | POA: Diagnosis present

## 2015-08-03 DIAGNOSIS — M960 Pseudarthrosis after fusion or arthrodesis: Principal | ICD-10-CM | POA: Diagnosis present

## 2015-08-03 DIAGNOSIS — Z791 Long term (current) use of non-steroidal anti-inflammatories (NSAID): Secondary | ICD-10-CM | POA: Diagnosis not present

## 2015-08-03 DIAGNOSIS — M79671 Pain in right foot: Secondary | ICD-10-CM | POA: Diagnosis not present

## 2015-08-03 DIAGNOSIS — Z741 Need for assistance with personal care: Secondary | ICD-10-CM | POA: Diagnosis not present

## 2015-08-03 HISTORY — PX: FUSION OF TALONAVICULAR JOINT: SHX6332

## 2015-08-03 HISTORY — DX: Chronic kidney disease, unspecified: N18.9

## 2015-08-03 LAB — CREATININE, SERUM
Creatinine, Ser: 1.76 mg/dL — ABNORMAL HIGH (ref 0.44–1.00)
GFR calc Af Amer: 34 mL/min — ABNORMAL LOW (ref >60)
GFR calc non Af Amer: 29 mL/min — ABNORMAL LOW (ref >60)

## 2015-08-03 LAB — CBC
HCT: 32.4 % — ABNORMAL LOW (ref 35.0–47.0)
Hemoglobin: 10.6 g/dL — ABNORMAL LOW (ref 12.0–16.0)
MCH: 27 pg (ref 26.0–34.0)
MCHC: 32.8 g/dL (ref 32.0–36.0)
MCV: 82.4 fL (ref 80.0–100.0)
Platelets: 150 10*3/uL (ref 150–440)
RBC: 3.93 MIL/uL (ref 3.80–5.20)
RDW: 14.1 % (ref 11.5–14.5)
WBC: 6 10*3/uL (ref 3.6–11.0)

## 2015-08-03 SURGERY — FUSION, TALONAVICULAR JOINT
Anesthesia: General | Site: Foot | Laterality: Right | Wound class: Clean

## 2015-08-03 MED ORDER — FENTANYL CITRATE (PF) 100 MCG/2ML IJ SOLN
50.0000 ug | Freq: Once | INTRAMUSCULAR | Status: AC
  Administered 2015-08-03: 50 ug via INTRAVENOUS

## 2015-08-03 MED ORDER — PANTOPRAZOLE SODIUM 40 MG PO TBEC
40.0000 mg | DELAYED_RELEASE_TABLET | Freq: Every day | ORAL | Status: DC
  Administered 2015-08-04 – 2015-08-07 (×4): 40 mg via ORAL
  Filled 2015-08-03 (×4): qty 1

## 2015-08-03 MED ORDER — BUPIVACAINE HCL (PF) 0.5 % IJ SOLN
INTRAMUSCULAR | Status: AC
  Filled 2015-08-03: qty 30

## 2015-08-03 MED ORDER — LIDOCAINE HCL (CARDIAC) 20 MG/ML IV SOLN
INTRAVENOUS | Status: DC | PRN
  Administered 2015-08-03: 50 mg via INTRAVENOUS

## 2015-08-03 MED ORDER — ROPIVACAINE HCL 5 MG/ML IJ SOLN
INTRAMUSCULAR | Status: AC
  Administered 2015-08-03: 10 mL via PERINEURAL
  Administered 2015-08-03: 20 mL
  Administered 2015-08-03: 10 mL via PERINEURAL
  Filled 2015-08-03: qty 40

## 2015-08-03 MED ORDER — MIDAZOLAM HCL 5 MG/5ML IJ SOLN
INTRAMUSCULAR | Status: AC
  Filled 2015-08-03: qty 5

## 2015-08-03 MED ORDER — BUPIVACAINE HCL (PF) 0.25 % IJ SOLN
INTRAMUSCULAR | Status: AC
  Filled 2015-08-03: qty 30

## 2015-08-03 MED ORDER — HYDRALAZINE HCL 50 MG PO TABS
50.0000 mg | ORAL_TABLET | Freq: Two times a day (BID) | ORAL | Status: DC
Start: 2015-08-03 — End: 2015-08-07
  Administered 2015-08-03 – 2015-08-07 (×8): 50 mg via ORAL
  Filled 2015-08-03 (×8): qty 1

## 2015-08-03 MED ORDER — ACETAMINOPHEN 10 MG/ML IV SOLN
INTRAVENOUS | Status: AC
  Filled 2015-08-03: qty 100

## 2015-08-03 MED ORDER — TIZANIDINE HCL 4 MG PO TABS: 4.0000 mg | ORAL_TABLET | Freq: Every evening | ORAL | Status: DC | PRN

## 2015-08-03 MED ORDER — HYDROMORPHONE HCL 1 MG/ML IJ SOLN
0.2500 mg | INTRAMUSCULAR | Status: DC | PRN
  Administered 2015-08-03 (×2): 0.5 mg via INTRAVENOUS

## 2015-08-03 MED ORDER — OMEGA-3-ACID ETHYL ESTERS 1 G PO CAPS
2.0000 g | ORAL_CAPSULE | Freq: Two times a day (BID) | ORAL | Status: DC
  Administered 2015-08-03 – 2015-08-07 (×8): 2 g via ORAL
  Filled 2015-08-03 (×8): qty 2

## 2015-08-03 MED ORDER — LIDOCAINE HCL (PF) 1 % IJ SOLN
INTRAMUSCULAR | Status: AC
  Filled 2015-08-03: qty 30

## 2015-08-03 MED ORDER — POLYETHYL GLYCOL-PROPYL GLYCOL 0.4-0.3 % OP GEL
OPHTHALMIC | Status: DC | PRN
  Filled 2015-08-03: qty 10

## 2015-08-03 MED ORDER — MIDAZOLAM HCL 5 MG/5ML IJ SOLN: 1.0000 mg | Freq: Once | INTRAMUSCULAR | Status: DC

## 2015-08-03 MED ORDER — MORPHINE SULFATE (PF) 2 MG/ML IV SOLN
2.0000 mg | INTRAVENOUS | Status: DC | PRN
  Administered 2015-08-03: 2 mg via INTRAVENOUS
  Filled 2015-08-03 (×2): qty 1

## 2015-08-03 MED ORDER — PHENYLEPHRINE HCL 10 MG/ML IJ SOLN
INTRAMUSCULAR | Status: DC | PRN
  Administered 2015-08-03: 50 ug via INTRAVENOUS
  Administered 2015-08-03: 100 ug via INTRAVENOUS
  Administered 2015-08-03: 50 ug via INTRAVENOUS

## 2015-08-03 MED ORDER — DEXAMETHASONE SODIUM PHOSPHATE 10 MG/ML IJ SOLN
INTRAMUSCULAR | Status: DC | PRN
  Administered 2015-08-03: 10 mg via INTRAVENOUS

## 2015-08-03 MED ORDER — ASPIRIN 81 MG PO CHEW
81.0000 mg | CHEWABLE_TABLET | Freq: Every day | ORAL | Status: DC
  Administered 2015-08-03 – 2015-08-07 (×5): 81 mg via ORAL
  Filled 2015-08-03 (×5): qty 1

## 2015-08-03 MED ORDER — LEVOTHYROXINE SODIUM 75 MCG PO TABS
150.0000 ug | ORAL_TABLET | Freq: Every day | ORAL | Status: DC
  Administered 2015-08-04 – 2015-08-07 (×4): 150 ug via ORAL
  Filled 2015-08-03 (×4): qty 2

## 2015-08-03 MED ORDER — BUPIVACAINE HCL (PF) 0.25 % IJ SOLN
INTRAMUSCULAR | Status: DC | PRN
  Administered 2015-08-03: 15 mL

## 2015-08-03 MED ORDER — LIDOCAINE HCL (PF) 1 % IJ SOLN
INTRAMUSCULAR | Status: AC
  Administered 2015-08-03: 1 mL via INTRADERMAL
  Filled 2015-08-03: qty 5

## 2015-08-03 MED ORDER — FENTANYL CITRATE (PF) 100 MCG/2ML IJ SOLN
INTRAMUSCULAR | Status: AC
  Filled 2015-08-03: qty 2

## 2015-08-03 MED ORDER — HYDROMORPHONE HCL 1 MG/ML IJ SOLN
1.0000 mg | INTRAMUSCULAR | Status: DC | PRN
  Administered 2015-08-03 – 2015-08-06 (×14): 1 mg via INTRAVENOUS
  Filled 2015-08-03 (×14): qty 1

## 2015-08-03 MED ORDER — HYDROCODONE-ACETAMINOPHEN 5-325 MG PO TABS
1.0000 | ORAL_TABLET | Freq: Two times a day (BID) | ORAL | Status: DC
  Administered 2015-08-03 – 2015-08-06 (×6): 1 via ORAL
  Filled 2015-08-03 (×7): qty 1

## 2015-08-03 MED ORDER — FENTANYL CITRATE (PF) 100 MCG/2ML IJ SOLN
INTRAMUSCULAR | Status: DC | PRN
  Administered 2015-08-03: 25 ug via INTRAVENOUS
  Administered 2015-08-03 (×3): 50 ug via INTRAVENOUS

## 2015-08-03 MED ORDER — SODIUM CHLORIDE 0.9 % IV SOLN
INTRAVENOUS | Status: DC
  Administered 2015-08-03 – 2015-08-04 (×3): via INTRAVENOUS

## 2015-08-03 MED ORDER — EPHEDRINE SULFATE 50 MG/ML IJ SOLN
INTRAMUSCULAR | Status: DC | PRN
  Administered 2015-08-03: 5 mg via INTRAVENOUS

## 2015-08-03 MED ORDER — HYDROMORPHONE HCL 1 MG/ML IJ SOLN
INTRAMUSCULAR | Status: AC
  Administered 2015-08-03: 0.5 mg via INTRAVENOUS
  Filled 2015-08-03: qty 1

## 2015-08-03 MED ORDER — ONDANSETRON HCL 4 MG/2ML IJ SOLN
INTRAMUSCULAR | Status: DC | PRN
  Administered 2015-08-03: 4 mg via INTRAVENOUS

## 2015-08-03 MED ORDER — FAMOTIDINE 20 MG PO TABS
20.0000 mg | ORAL_TABLET | Freq: Two times a day (BID) | ORAL | Status: DC
  Administered 2015-08-03 – 2015-08-06 (×6): 20 mg via ORAL
  Filled 2015-08-03 (×6): qty 1

## 2015-08-03 MED ORDER — ENOXAPARIN SODIUM 40 MG/0.4ML ~~LOC~~ SOLN
40.0000 mg | SUBCUTANEOUS | Status: DC
  Administered 2015-08-03 – 2015-08-06 (×4): 40 mg via SUBCUTANEOUS
  Filled 2015-08-03 (×5): qty 0.4

## 2015-08-03 MED ORDER — ACETAMINOPHEN 325 MG PO TABS
650.0000 mg | ORAL_TABLET | Freq: Four times a day (QID) | ORAL | Status: DC | PRN
  Administered 2015-08-03 – 2015-08-06 (×4): 650 mg via ORAL
  Filled 2015-08-03 (×4): qty 2

## 2015-08-03 MED ORDER — MIDAZOLAM HCL 2 MG/2ML IJ SOLN
INTRAMUSCULAR | Status: DC | PRN
  Administered 2015-08-03: 1 mg via INTRAVENOUS

## 2015-08-03 MED ORDER — LACTATED RINGERS IV SOLN
INTRAVENOUS | Status: DC
  Administered 2015-08-03: 10:00:00 via INTRAVENOUS

## 2015-08-03 MED ORDER — GABAPENTIN 300 MG PO CAPS
600.0000 mg | ORAL_CAPSULE | Freq: Every day | ORAL | Status: DC
  Administered 2015-08-03 – 2015-08-06 (×4): 600 mg via ORAL
  Filled 2015-08-03 (×4): qty 2

## 2015-08-03 MED ORDER — CLINDAMYCIN PHOSPHATE 600 MG/50ML IV SOLN
INTRAVENOUS | Status: AC
  Administered 2015-08-03: 600 mg via INTRAVENOUS
  Filled 2015-08-03: qty 50

## 2015-08-03 MED ORDER — POTASSIUM CHLORIDE CRYS ER 20 MEQ PO TBCR
20.0000 meq | EXTENDED_RELEASE_TABLET | Freq: Every day | ORAL | Status: DC
  Administered 2015-08-03 – 2015-08-06 (×4): 20 meq via ORAL
  Filled 2015-08-03 (×4): qty 1

## 2015-08-03 MED ORDER — TRIAMTERENE-HCTZ 37.5-25 MG PO TABS
2.0000 | ORAL_TABLET | Freq: Every day | ORAL | Status: DC
  Administered 2015-08-03 – 2015-08-07 (×5): 2 via ORAL
  Filled 2015-08-03 (×5): qty 2

## 2015-08-03 MED ORDER — PROPOFOL 10 MG/ML IV BOLUS
INTRAVENOUS | Status: DC | PRN
  Administered 2015-08-03: 200 mg via INTRAVENOUS

## 2015-08-03 MED ORDER — ACETAMINOPHEN 10 MG/ML IV SOLN
INTRAVENOUS | Status: DC | PRN
  Administered 2015-08-03: 1000 mg via INTRAVENOUS

## 2015-08-03 MED ORDER — CLINDAMYCIN PHOSPHATE 600 MG/50ML IV SOLN: 600.0000 mg | Freq: Once | INTRAVENOUS | Status: DC

## 2015-08-03 SURGICAL SUPPLY — 57 items
BAG COUNTER SPONGE EZ (MISCELLANEOUS) ×1 IMPLANT
BAG SPNG 4X4 CLR HAZ (MISCELLANEOUS)
BANDAGE ELASTIC 4 LF NS (GAUZE/BANDAGES/DRESSINGS) ×3 IMPLANT
BANDAGE STRETCH 3X4.1 STRL (GAUZE/BANDAGES/DRESSINGS) ×2 IMPLANT
BIT DRILL 2 FAST STEP (BIT) ×1 IMPLANT
BIT DRILL CALIBRATED 2.7 (BIT) ×1 IMPLANT
BLADE SURG 15 STRL LF DISP TIS (BLADE) ×2 IMPLANT
BLADE SURG 15 STRL SS (BLADE) ×4
BLADE SURG MINI STRL (BLADE) ×2 IMPLANT
BNDG CMPR MED 5X4 ELC HKLP NS (GAUZE/BANDAGES/DRESSINGS) ×2
BNDG ESMARK 4X12 TAN STRL LF (GAUZE/BANDAGES/DRESSINGS) ×2 IMPLANT
BUR 4X45 EGG (BURR) ×2 IMPLANT
CANISTER SUCT 1200ML W/VALVE (MISCELLANEOUS) ×2 IMPLANT
CUFF TOURN 18 STER (MISCELLANEOUS) ×1 IMPLANT
DRAPE FLUOR MINI C-ARM 54X84 (DRAPES) ×2 IMPLANT
DRAPE SURG 17X11 SM STRL (DRAPES) ×2 IMPLANT
DURAPREP 26ML APPLICATOR (WOUND CARE) ×2 IMPLANT
ELECT REM PT RETURN 9FT ADLT (ELECTROSURGICAL) ×2
ELECTRODE REM PT RTRN 9FT ADLT (ELECTROSURGICAL) ×1 IMPLANT
GAUZE PETRO XEROFOAM 1X8 (MISCELLANEOUS) ×2 IMPLANT
GAUZE SPONGE 4X4 12PLY STRL (GAUZE/BANDAGES/DRESSINGS) ×2 IMPLANT
GLOVE BIO SURGEON STRL SZ7.5 (GLOVE) ×3 IMPLANT
GLOVE INDICATOR 8.0 STRL GRN (GLOVE) ×2 IMPLANT
GOWN STRL REUS W/ TWL LRG LVL3 (GOWN DISPOSABLE) ×2 IMPLANT
GOWN STRL REUS W/TWL LRG LVL3 (GOWN DISPOSABLE) ×8
GRAFT TRIN ELITE MED MUSC TRAN (Graft) ×1 IMPLANT
IV D 5 LR 1000ML (IV SOLUTION) ×1 IMPLANT
MANIPULATOR VCARE STD CRV RETR (MISCELLANEOUS) ×2 IMPLANT
NDL HYPO 25X1 1.5 SAFETY (NEEDLE) ×3 IMPLANT
NEEDLE HYPO 25X1 1.5 SAFETY (NEEDLE) ×6 IMPLANT
NS IRRIG 500ML POUR BTL (IV SOLUTION) ×3 IMPLANT
PACK EXTREMITY ARMC (MISCELLANEOUS) ×2 IMPLANT
PENCIL ELECTRO HAND CTR (MISCELLANEOUS) ×2 IMPLANT
PLATE 2.5 SM F/IN-LINE FUSION (Plate) ×1 IMPLANT
SCREW CANC NONLOCK 2.5X28 (Screw) ×1 IMPLANT
SCREW PEG LOCK 2.5X24 (Peg) ×1 IMPLANT
SCREW PEG LOCK 2.5X26 (Peg) ×1 IMPLANT
SCREW PEG LOCK 2.5X28 (Peg) ×1 IMPLANT
SPLINT CAST 1 STEP 4X30 (MISCELLANEOUS) ×2 IMPLANT
SPLINT FAST PLASTER 5X30 (CAST SUPPLIES) ×10
SPLINT PLASTER CAST FAST 5X30 (CAST SUPPLIES) IMPLANT
SPONGE LAP 18X18 5 PK (GAUZE/BANDAGES/DRESSINGS) ×1 IMPLANT
SPONGE XRAY 4X4 16PLY STRL (MISCELLANEOUS) ×1 IMPLANT
STAPLE NIT SUPER 20X20X20X2 (Staple) ×2 IMPLANT
STIMULATOR BONE (ORTHOPEDIC SUPPLIES) ×1
STIMULATOR BONE GROWTH EMG EXT (ORTHOPEDIC SUPPLIES) IMPLANT
STOCKINETTE STRL 6IN 960660 (GAUZE/BANDAGES/DRESSINGS) ×2 IMPLANT
STRAP SAFETY BODY (MISCELLANEOUS) ×2 IMPLANT
STRIP CLOSURE SKIN 1/4X4 (GAUZE/BANDAGES/DRESSINGS) ×2 IMPLANT
SUT ETHILON 2 0 FS 18 (SUTURE) ×2 IMPLANT
SUT ETHILON 5-0 FS-2 18 BLK (SUTURE) ×2 IMPLANT
SUT VIC AB 2-0 CT2 27 (SUTURE) ×3 IMPLANT
SUT VIC AB 4-0 FS2 27 (SUTURE) ×2 IMPLANT
SUT VICRYL AB 3-0 FS1 BRD 27IN (SUTURE) ×1 IMPLANT
SYRINGE 10CC LL (SYRINGE) ×4 IMPLANT
WIRE Z .045 C-WIRE SPADE TIP (WIRE) ×4 IMPLANT
WIRE Z .062 C-WIRE SPADE TIP (WIRE) ×2 IMPLANT

## 2015-08-03 NOTE — Anesthesia Postprocedure Evaluation (Signed)
Anesthesia Post Note  Patient: Michele Moore  Procedure(s) Performed: Procedure(s) (LRB): TAILOR NAVICULAR JOINT FUSION - RIGHT  (Right)  Patient location during evaluation: PACU Anesthesia Type: General Level of consciousness: awake and alert Pain management: pain level controlled Vital Signs Assessment: post-procedure vital signs reviewed and stable Respiratory status: spontaneous breathing, nonlabored ventilation, respiratory function stable and patient connected to nasal cannula oxygen Cardiovascular status: blood pressure returned to baseline and stable Postop Assessment: no signs of nausea or vomiting Anesthetic complications: no    Last Vitals:  Filed Vitals:   08/03/15 1548 08/03/15 1632  BP: 130/62 131/63  Pulse: 66 67  Temp: 36.6 C 36.5 C  Resp: 18 18    Last Pain:  Filed Vitals:   08/03/15 1709  PainSc: 10-Worst pain ever                 Roselynn Whitacre S

## 2015-08-03 NOTE — H&P (Signed)
  HISTORY AND PHYSICAL INTERVAL NOTE:  08/03/2015  10:28 AM  Michele Moore  has presented today for surgery, with the diagnosis of ARTHRITIS OF RIGHT ANKLEOR FOOT DEGENERATIVE.  The various methods of treatment have been discussed with the patient.  No guarantees were given.  After consideration of risks, benefits and other options for treatment, the patient has consented to surgery.  I have reviewed the patients' chart and labs.    Patient Vitals for the past 24 hrs:  BP Temp Temp src Pulse Resp SpO2  08/03/15 0952 (!) 155/70 mmHg 98.4 F (36.9 C) Oral 65 18 100 %    A history and physical examination was performed in my office.  The patient was reexamined.  There have been no changes to this history and physical examination.  Michele Moore A

## 2015-08-03 NOTE — H&P (Signed)
Michele Moore is an 65 y.o. female.   Chief Complaint:  <principal problem not specified>   HPI: Pt admitted s/p right foot midfoot fusion for non-union talo-navicular joint fusion.  Admitted for post-op pain management and PT.    Past Medical History  Diagnosis Date  . Arthritis   . Thyroid disease   . Anemia   . GERD (gastroesophageal reflux disease)   . Hemorrhoid   . Multiple gastric ulcers   . Hypertension   . Mixed hyperlipidemia   . Migraines   . History of hiatal hernia   . Family history of adverse reaction to anesthesia     sister difficult to put to sleep  . Chronic kidney disease     STAGE 3 PER DR EASON 08/02/15    Past Surgical History  Procedure Laterality Date  . Carpal tunnel release      x3  . Rotator cuff repair    . Ankle surgery    . Tubal ligation    . Colonoscopy  2000?  Marland Kitchen Upper gi endoscopy  2000?  Marland Kitchen Joint replacement      knee x 3 ,right x1 and left x2  . Tonsillectomy    . Fusion of talonavicular joint Right 08/03/2015    Procedure: TAILOR NAVICULAR JOINT FUSION - RIGHT ;  Surgeon: [name], DPM;  Location: [facility] ORS;  Service: Podiatry;  Laterality: Right;    Family History  Problem Relation Age of Onset  . COPD Sister   . Cancer Maternal Aunt     breast  . Cancer Maternal Uncle     kidney   Social History:  reports that she quit smoking about 22 years ago. She has never used smokeless tobacco. She reports that she does not drink alcohol or use illicit drugs.  Allergies:  Allergies  Allergen Reactions  . Penicillins Anaphylaxis  . Vibramycin [Doxycycline Calcium] Rash    ROS  Medications Prior to Admission  Medication Sig Dispense Refill  . Acetaminophen (TYLENOL PO) Take 500 mg by mouth as needed.    . gabapentin (NEURONTIN) 300 MG capsule Take 600 mg by mouth at bedtime.    . hydrALAZINE (APRESOLINE) 50 MG tablet Take 50 mg by mouth 2 (two) times daily.    Marland Kitchen HYDROcodone-acetaminophen (NORCO/VICODIN) 5-325 MG per tablet Take 1  tablet by mouth 2 (two) times daily.   0  . omega-3 acid ethyl esters (LOVAZA) 1 G capsule Take 2 g by mouth 2 (two) times daily.   5  . omeprazole (PRILOSEC) 40 MG capsule Take 40 mg by mouth daily.   0  . Polyethyl Glycol-Propyl Glycol (SYSTANE OP) Apply to eye.    . potassium chloride SA (K-DUR,KLOR-CON) 20 MEQ tablet Take 20-40 mEq by mouth daily.   5  . ranitidine (ZANTAC) 150 MG tablet Take 150 mg by mouth 2 (two) times daily.   4  . SYNTHROID 150 MCG tablet Take 150 mcg by mouth daily before breakfast.   5  . tiZANidine (ZANAFLEX) 4 MG capsule Take 4 mg by mouth at bedtime as needed.     . triamterene-hydrochlorothiazide (MAXZIDE) 75-50 MG per tablet Take 1 tablet by mouth daily.   4  . aspirin 81 MG tablet Take 81 mg by mouth daily.    . meloxicam (MOBIC) 15 MG tablet Take 1 tablet (15 mg total) by mouth daily. 30 tablet 0    Physical Exam: General: Alert and oriented.  No apparent distress. Vascular:  Left foot:Dorsalis Pedis:  present Posterior Tibial:  present  Right foot: Dorsalis Pedis:  present Posterior Tibial:  present Neuro:intact sensation Derm:Right foot with dressing and splints.  S/P right T-N fusion with dorsal midfoot and medial midfoot incision. Ortho/MS: Right lower leg in splint.     Results for orders placed or performed during the hospital encounter of 08/03/15 (from the past 48 hour(s))  CBC     Status: Abnormal   Collection Time: 08/03/15  4:02 PM  Result Value Ref Range   WBC 6.0 3.6 - 11.0 K/uL   RBC 3.93 3.80 - 5.20 MIL/uL   Hemoglobin 10.6 (L) 12.0 - 16.0 g/dL   HCT 32.4 (L) 35.0 - 47.0 %   MCV 82.4 80.0 - 100.0 fL   MCH 27.0 26.0 - 34.0 pg   MCHC 32.8 32.0 - 36.0 g/dL   RDW 14.1 11.5 - 14.5 %   Platelets 150 150 - 440 K/uL  Creatinine, serum     Status: Abnormal   Collection Time: 08/03/15  4:02 PM  Result Value Ref Range   Creatinine, Ser 1.76 (H) 0.44 - 1.00 mg/dL   GFR calc non Af Amer 29 (L) >60 mL/min   GFR calc Af Amer 34 (L) >60  mL/min    Comment: (NOTE) The eGFR has been calculated using the CKD EPI equation. This calculation has not been validated in all clinical situations. eGFR's persistently <60 mL/min signify possible Chronic Kidney Disease.    No results found.  Blood pressure 124/79, pulse 68, temperature 98.4 F (36.9 C), temperature source Oral, resp. rate 18, SpO2 95 %.  Assessment/Plan Admit for postop pain management. Will consult medicine for medical management.  Pt with hx of CRF. Consult PT for evaluation.  Pt may need SNF placment based on history of gait instability.     [name] A 08/03/2015, 9:03 PM

## 2015-08-03 NOTE — Transfer of Care (Signed)
Immediate Anesthesia Transfer of Care Note  Patient: Michele Moore  Procedure(s) Performed: Procedure(s): TAILOR NAVICULAR JOINT FUSION - RIGHT  (Right)  Patient Location: PACU  Anesthesia Type:General  Level of Consciousness: sedated  Airway & Oxygen Therapy: Patient Spontanous Breathing and Patient connected to face mask oxygen  Post-op Assessment: Report given to RN and Post -op Vital signs reviewed and stable  Post vital signs: Reviewed and stable  Last Vitals:  Filed Vitals:   08/03/15 0952 08/03/15 1348  BP: 155/70   Pulse: 65   Temp: 36.9 C 36.3 C  Resp: 18     Last Pain: There were no vitals filed for this visit.       Complications: No apparent anesthesia complications

## 2015-08-03 NOTE — Op Note (Signed)
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 that presents today for surgical intervention.  The risks/benefits/alternatives/complications have been discussed and consent has been given.    Procedure:  Patient was brought into the OR and placed on the operating table in thesupine position. After anesthesia was obtained theright lower extremity was prepped and draped in usual sterile fashion.  Attention was directed overlying the talonavicular joint at the level of the anterior tibial tendon where a longitudinal incision was made from the ankle distally to the navicular cuneiform region. Sharp and blunt dissection carried down to the deeper tissue. Full-thickness incision was then made and subperiosteal dissection was taken dorsal and medial along the talonavicular joint nonunion. Initially the large portion of the previous staple on the navicular region was noted and removed. At this time I was able to free up the talonavicular nonunion site. Marketed scarring was noted throughout the entire talonavicular joint region. A secondary incision was made on the plantar medial aspect of the foot at the navicular region. Full-thickness dissection was taken down with sharp and blunt dissection to the navicular tuberosity region. At this point the noted previous placed for cannulated screw was found and I was able to remove this from the surgical field in toto. A small  residual portion of a compression staple was left in the talar neck as this was not seen throughout the remainder of the procedure. Attention was then redirected to the talonavicular joint where this was prepared. Marketed amounts of fibrotic tissue were removed with combination of curettage and curettes. Periarticular spurring was taken away dorsal medial and lateral. After further preparation and removal of the nonunion sites the area was then contoured with a power bur. The head of the talus and navicular region were then drilled with a 2.0 mm drill bit. At this time the foot was held in a neutral position. The joint was then filled with 5 cc of Trinity bone graft. Initially a compression staple was placed on the dorsal aspect of the talonavicular joint fusion site. Good compression and alignment was noted. Next a 2.5 mm locking plate from the [employer] foot set was then placed dorsal medial along the talonavicular joint. Slight amount compression was applied to the plate as well. These were filled with 2.5 mm screws. A third compression staple was then placed medial at the talonavicular joint. Good alignment and stability was noted in all views. The stability was noted with good compression at the talonavicular joint site. All wounds were flushed with copious amounts or irrigation. Layered closure was performed with a 201 3-0 Vicryl the deeper layers and a 2-0 nylon for the skin. 0.25% Marcaine was placed around all incision sites. A well compressive sterile dressing was then applied. She was then placed in a compression posterior splint.    Patient tolerated the procedure and anesthesia well.  Was transported from the OR to the PACU with all vital signs stable and vascular status intact.  We'll admit for postoperative pain management. She will need physical therapy for assistance with nonweightbearing as she has some difficulty with nonweightbearing status will follow up tomorrow for further evaluation.

## 2015-08-03 NOTE — Anesthesia Procedure Notes (Addendum)
Anesthesia Regional Block:  Popliteal block  Pre-Anesthetic Checklist: ,, timeout performed, Correct Patient, Correct Site, Correct Laterality, Correct Procedure, Correct Position, site marked, Risks and benefits discussed,  Surgical consent,  Pre-op evaluation,  At surgeon's request and post-op pain management  Laterality: Right and Lower  Prep: chloraprep       Needles:  Injection technique: Single-shot  Needle Type: Echogenic Needle     Needle Length: 9cm 9 cm Needle Gauge: 21 and 21 G    Additional Needles:  Procedures: ultrasound guided (picture in chart) Popliteal block Narrative:  Start time: 08/03/2015 10:30 AM End time: 08/03/2015 10:33 AM Injection made incrementally with aspirations every 5 mL.  Performed by: Personally  Anesthesiologist: Katy Fitch K  Additional Notes: Patient reports baseline numbness in right foot  A saphenous ring was also placed at the level of the knee as well.  Functioning IV was confirmed and monitors were applied.  A echogenic needle was used. Sterile prep,hand hygiene and sterile gloves were used.  Negative aspiration and negative test dose prior to incremental administration of local anesthetic. The patient tolerated the procedure well with no immediate complications.   Procedure Name: LMA Insertion Date/Time: 08/03/2015 10:50 AM Performed by: Johnna Acosta Pre-anesthesia Checklist: Patient identified, Emergency Drugs available, Suction available, Patient being monitored and Timeout performed Patient Re-evaluated:Patient Re-evaluated prior to inductionOxygen Delivery Method: Circle system utilized Preoxygenation: Pre-oxygenation with 100% oxygen Intubation Type: IV induction LMA: LMA inserted LMA Size: 3.5 Tube type: Oral Number of attempts: 1 Placement Confirmation: positive ETCO2 and breath sounds checked- equal and bilateral Tube secured with: Tape Dental Injury: Teeth and Oropharynx as per pre-operative assessment

## 2015-08-03 NOTE — Consult Note (Signed)
[facility] NAME: Michele Moore    MR#:  [id number]  DATE OF BIRTH:  1950/12/31  DATE OF ADMISSION:  08/03/2015  PRIMARY CARE PHYSICIAN: Marden Noble, MD   REQUESTING/REFERRING PHYSICIAN: [name]  CHIEF COMPLAINT:  No chief complaint on file.   HISTORY OF PRESENT ILLNESS: Michele Moore  is a 65 y.o. female with a known history of  Osteoarthritis, osteoarthritis, hypothyroidism, anemia, GERD, hypertension, history of migraines and chronic kidney disease who hasNonunion talonavicular joint with posterior traumatic arthritis right foot who underwentRevision nonunion talonavicular joint with fusion talonavicular joint right foot. We were asked to see the patient for medical management. Patient currently complains of pain in the foot and her shoulder. She denies any shortness of breath chest pain or palpitations  PAST MEDICAL HISTORY:   Past Medical History  Diagnosis Date  . Arthritis   . Thyroid disease   . Anemia   . GERD (gastroesophageal reflux disease)   . Hemorrhoid   . Multiple gastric ulcers   . Hypertension   . Mixed hyperlipidemia   . Migraines   . History of hiatal hernia   . Family history of adverse reaction to anesthesia     sister difficult to put to sleep  . Chronic kidney disease     STAGE 3 PER DR EASON 08/02/15    PAST SURGICAL HISTORY: Past Surgical History  Procedure Laterality Date  . Carpal tunnel release      x3  . Rotator cuff repair    . Ankle surgery    . Tubal ligation    . Colonoscopy  2000?  Marland Kitchen Upper gi endoscopy  2000?  Marland Kitchen Joint replacement      knee x 3 ,right x1 and left x2  . Tonsillectomy    . Fusion of talonavicular joint Right 08/03/2015    Procedure: TAILOR NAVICULAR JOINT FUSION - RIGHT ;  Surgeon: [name], DPM;  Location: [facility] ORS;  Service: Podiatry;  Laterality: Right;    SOCIAL HISTORY:  Social History  Substance Use Topics  . Smoking status: Former Smoker   Quit date: 02/24/1993  . Smokeless tobacco: Never Used  . Alcohol Use: No    FAMILY HISTORY:  Family History  Problem Relation Age of Onset  . COPD Sister   . Cancer Maternal Aunt     breast  . Cancer Maternal Uncle     kidney    DRUG ALLERGIES:  Allergies  Allergen Reactions  . Penicillins Anaphylaxis  . Vibramycin [Doxycycline Calcium] Rash    REVIEW OF SYSTEMS:   CONSTITUTIONAL: No fever, fatigue or weakness.  EYES: No blurred or double vision.  EARS, NOSE, AND THROAT: No tinnitus or ear pain.  RESPIRATORY: No cough, shortness of breath, wheezing or hemoptysis.  CARDIOVASCULAR: No chest pain, orthopnea, edema.  GASTROINTESTINAL: No nausea, vomiting, diarrhea or abdominal pain.  GENITOURINARY: No dysuria, hematuria.  ENDOCRINE: No polyuria, nocturia,  HEMATOLOGY: No anemia, easy bruising or bleeding SKIN: No rash or lesion. MUSCULOSKELETAL: Pain in multiple joints NEUROLOGIC: No tingling, numbness, weakness.  PSYCHIATRY: No anxiety or depression.   MEDICATIONS AT HOME:  Prior to Admission medications   Medication Sig Start Date End Date Taking? Authorizing Provider  Acetaminophen (TYLENOL PO) Take 500 mg by mouth as needed.   Yes Historical Provider, MD  gabapentin (NEURONTIN) 300 MG capsule Take 600 mg by mouth at bedtime.   Yes Historical Provider, MD  hydrALAZINE (APRESOLINE) 50 MG  tablet Take 50 mg by mouth 2 (two) times daily.   Yes Historical Provider, MD  HYDROcodone-acetaminophen (NORCO/VICODIN) 5-325 MG per tablet Take 1 tablet by mouth 2 (two) times daily.  03/30/14  Yes Historical Provider, MD  omega-3 acid ethyl esters (LOVAZA) 1 G capsule Take 2 g by mouth 2 (two) times daily.  03/09/14  Yes Historical Provider, MD  omeprazole (PRILOSEC) 40 MG capsule Take 40 mg by mouth daily.  04/07/14  Yes Historical Provider, MD  Polyethyl Glycol-Propyl Glycol (SYSTANE OP) Apply to eye.   Yes Historical Provider, MD  potassium chloride SA (K-DUR,KLOR-CON) 20 MEQ tablet  Take 20-40 mEq by mouth daily.  02/14/14  Yes Historical Provider, MD  ranitidine (ZANTAC) 150 MG tablet Take 150 mg by mouth 2 (two) times daily.  03/29/14  Yes Historical Provider, MD  SYNTHROID 150 MCG tablet Take 150 mcg by mouth daily before breakfast.  03/28/14  Yes Historical Provider, MD  tiZANidine (ZANAFLEX) 4 MG capsule Take 4 mg by mouth at bedtime as needed.    Yes Historical Provider, MD  triamterene-hydrochlorothiazide (MAXZIDE) 75-50 MG per tablet Take 1 tablet by mouth daily.  03/22/14  Yes Historical Provider, MD  aspirin 81 MG tablet Take 81 mg by mouth daily.    Historical Provider, MD  meloxicam (MOBIC) 15 MG tablet Take 1 tablet (15 mg total) by mouth daily. 02/21/15   Charline Bills Cuthriell, PA-C      PHYSICAL EXAMINATION:   VITAL SIGNS: Blood pressure 130/62, pulse 66, temperature 97.8 F (36.6 C), temperature source Oral, resp. rate 18, SpO2 96 %.  GENERAL:  65 y.o.-year-old patient lying in the bed with no acute distress.  EYES: Pupils equal, round, reactive to light and accommodation. No scleral icterus. Extraocular muscles intact.  HEENT: Head atraumatic, normocephalic. Oropharynx and nasopharynx clear.  NECK:  Supple, no jugular venous distention. No thyroid enlargement, no tenderness.  LUNGS: Normal breath sounds bilaterally, no wheezing, rales,rhonchi or crepitation. No use of accessory muscles of respiration.  CARDIOVASCULAR: S1, S2 normal. No murmurs, rubs, or gallops.  ABDOMEN: Soft, nontender, nondistended. Bowel sounds present. No organomegaly or mass.  EXTREMITIES: Right foot dressing in place NEUROLOGIC: Cranial nerves II through XII are intact. Muscle strength 5/5 in all extremities. Sensation intact. Gait not checked.  PSYCHIATRIC: The patient is alert and oriented x 3.  SKIN: No obvious rash, lesion, or ulcer.   LABORATORY PANEL:   CBC  Recent Labs Lab 07/30/15 1114 08/03/15 1602  WBC 4.7 6.0  HGB 10.3* 10.6*  HCT 31.6* 32.4*  PLT 166 150  MCV  84.1 82.4  MCH 27.3 27.0  MCHC 32.5 32.8  RDW 13.9 14.1   ------------------------------------------------------------------------------------------------------------------  Chemistries   Recent Labs Lab 07/30/15 1114 08/03/15 1602  NA 141  --   K 4.4  --   CL 107  --   CO2 27  --   GLUCOSE 89  --   BUN 32*  --   CREATININE 1.95* 1.76*  CALCIUM 9.2  --    ------------------------------------------------------------------------------------------------------------------ estimated creatinine clearance is 42.5 mL/min (by C-G formula based on Cr of 1.76). ------------------------------------------------------------------------------------------------------------------ No results for input(s): TSH, T4TOTAL, T3FREE, THYROIDAB in the last 72 hours.  Invalid input(s): FREET3   Coagulation profile No results for input(s): INR, PROTIME in the last 168 hours. ------------------------------------------------------------------------------------------------------------------- No results for input(s): DDIMER in the last 72 hours. -------------------------------------------------------------------------------------------------------------------  Cardiac Enzymes No results for input(s): CKMB, TROPONINI, MYOGLOBIN in the last 168 hours.  Invalid input(s): CK ------------------------------------------------------------------------------------------------------------------ Invalid  input(s): POCBNP  ---------------------------------------------------------------------------------------------------------------  Urinalysis    Component Value Date/Time   COLORURINE Yellow 12/27/2013 1157   APPEARANCEUR Clear 12/27/2013 1157   LABSPEC 1.008 12/27/2013 1157   PHURINE 6.0 12/27/2013 1157   GLUCOSEU Negative 12/27/2013 1157   HGBUR Negative 12/27/2013 1157   BILIRUBINUR Negative 12/27/2013 1157   KETONESUR Negative 12/27/2013 1157   PROTEINUR Negative 12/27/2013 1157   NITRITE Negative  12/27/2013 1157   LEUKOCYTESUR Trace 12/27/2013 1157     RADIOLOGY: No results found.  EKG: Orders placed or performed during the hospital encounter of 07/30/15  . EKG 12 lead  . EKG 12 lead    IMPRESSION AND PLAN: Patient's a 65 year old with multiple medical problems status post foot surgery  1. Acute renal failure on chronic kidney disease will give her IV fluids follow her renal function  2. Hypothyroidism continue Synthroid as taking at home  3. GERD continue Protonix as prescribed  4. Hypertension continue HCTZ , if her renal function gets worse consider stopping that   5. Chronic back pain continue Zanaflex  6. Miscellaneous Lovenox for DVT prophylaxis  All the records are reviewed and case discussed with ED provider. Management plans discussed with the patient, family and they are in agreement.  CODE STATUS:    Code Status Orders        Start     Ordered   08/03/15 1504  Full code   Continuous     08/03/15 1503    Code Status History    Date Active Date Inactive Code Status Order ID Comments User Context   This patient has a current code status but no historical code status.       TOTAL TIME TAKING CARE OF THIS PATIENT: 55 minutes.    Dustin Flock M.D on 08/03/2015 at 4:26 PM  Between 7am to 6pm - Pager - [phone]  After 6pm go to [email or web address] - password [email or web address] [facility]  Office  [phone]  CC: Primary care physician; Marden Noble, MD

## 2015-08-03 NOTE — Anesthesia Preprocedure Evaluation (Addendum)
Anesthesia Evaluation  Patient identified by MRN, date of birth, ID band Patient awake    Reviewed: Allergy & Precautions, H&P , NPO status , Patient's Chart, lab work & pertinent test results  History of Anesthesia Complications Negative for: history of anesthetic complications  Airway Mallampati: III  TM Distance: >3 FB Neck ROM: limited    Dental  (+) Poor Dentition, Chipped, Missing   Pulmonary neg shortness of breath, former smoker,    Pulmonary exam normal breath sounds clear to auscultation       Cardiovascular Exercise Tolerance: Good hypertension, (-) DOE Normal cardiovascular exam Rhythm:regular Rate:Normal     Neuro/Psych  Headaches, negative neurological ROS  negative psych ROS   GI/Hepatic Neg liver ROS, hiatal hernia, PUD, GERD  Controlled,  Endo/Other  negative endocrine ROS  Renal/GU Renal disease  negative genitourinary   Musculoskeletal  (+) Arthritis ,   Abdominal   Peds  Hematology negative hematology ROS (+)   Anesthesia Other Findings Past Medical History:   Arthritis                                                    Thyroid disease                                              Anemia                                                       GERD (gastroesophageal reflux disease)                       Hemorrhoid                                                   Multiple gastric ulcers                                      Hypertension                                                 Mixed hyperlipidemia                                         Migraines                                                    History of hiatal hernia  Family history of adverse reaction to anesthes*                Comment:sister difficult to put to sleep   Chronic kidney disease                                         Comment:STAGE 3 PER DR EASON 08/02/15  Past Surgical History:  CARPAL TUNNEL RELEASE                                           Comment:x3   ROTATOR CUFF REPAIR                                           ANKLE SURGERY                                                 TUBAL LIGATION                                                COLONOSCOPY                                      2000?        UPPER GI ENDOSCOPY                               2000?        JOINT REPLACEMENT                                               Comment:knee x 3 ,right x1 and left x2   TONSILLECTOMY                                                 Patient reports baseline numbness in right foot  Reproductive/Obstetrics negative OB ROS                            Anesthesia Physical Anesthesia Plan  ASA: III  Anesthesia Plan: General LMA   Post-op Pain Management:  Regional for Post-op pain   Induction:   Airway Management Planned:   Additional Equipment:   Intra-op Plan:   Post-operative Plan:   Informed Consent: I have reviewed the patients History and Physical, chart, labs and discussed the procedure including the risks, benefits and alternatives for the proposed anesthesia with the patient or authorized representative who has indicated his/her understanding and acceptance.   Dental Advisory Given  Plan Discussed  with: Anesthesiologist, CRNA and Surgeon  Anesthesia Plan Comments:         Anesthesia Quick Evaluation

## 2015-08-04 LAB — BASIC METABOLIC PANEL
Anion gap: 6 (ref 5–15)
BUN: 28 mg/dL — AB (ref 6–20)
CHLORIDE: 107 mmol/L (ref 101–111)
CO2: 25 mmol/L (ref 22–32)
Calcium: 8.6 mg/dL — ABNORMAL LOW (ref 8.9–10.3)
Creatinine, Ser: 1.71 mg/dL — ABNORMAL HIGH (ref 0.44–1.00)
GFR calc Af Amer: 35 mL/min — ABNORMAL LOW (ref >60)
GFR calc non Af Amer: 30 mL/min — ABNORMAL LOW (ref >60)
GLUCOSE: 126 mg/dL — AB (ref 65–99)
POTASSIUM: 4.4 mmol/L (ref 3.5–5.1)
SODIUM: 138 mmol/L (ref 135–145)

## 2015-08-04 MED ORDER — POLYETHYLENE GLYCOL 3350 17 G PO PACK
17.0000 g | PACK | Freq: Every day | ORAL | Status: DC
Start: 2015-08-04 — End: 2015-08-07
  Administered 2015-08-04 – 2015-08-06 (×3): 17 g via ORAL
  Filled 2015-08-04 (×4): qty 1

## 2015-08-04 NOTE — Progress Notes (Signed)
Daily Progress Note   Subjective  - 1 Day Post-Op  S/p right T-N fusion right midfoot.  Some pain as expected.  Taking po and IV pain meds.  No SOB or CP.  Objective Filed Vitals:   08/03/15 2102 08/04/15 0432 08/04/15 0542 08/04/15 0752  BP: 124/79 109/61  116/76  Pulse:  58  63  Temp:  98.4 F (36.9 C)  98 F (36.7 C)  TempSrc:  Oral  Oral  Resp:  20  16  Height:   5\' 11"  (1.803 m)   Weight:   108.863 kg (240 lb)   SpO2:  95%  100%    Physical Exam: Right foot with splint.  Sensation returning to toes from popliteal block.  Able to actively dorsi/plantarflex toes.  No calf pain.  Laboratory CBC    Component Value Date/Time   WBC 6.0 08/03/2015 1602   WBC 6.8 12/27/2013 1125   HGB 10.6* 08/03/2015 1602   HGB 9.5* 12/27/2013 1125   HCT 32.4* 08/03/2015 1602   HCT 29.5* 12/27/2013 1125   PLT 150 08/03/2015 1602   PLT 214 12/27/2013 1125    BMET    Component Value Date/Time   NA 138 08/04/2015 0315   NA 142 12/27/2013 1125   K 4.4 08/04/2015 0315   K 3.8 12/27/2013 1125   CL 107 08/04/2015 0315   CL 107 12/27/2013 1125   CO2 25 08/04/2015 0315   CO2 29 12/27/2013 1125   GLUCOSE 126* 08/04/2015 0315   GLUCOSE 107* 12/27/2013 1125   BUN 28* 08/04/2015 0315   BUN 25* 12/27/2013 1125   CREATININE 1.71* 08/04/2015 0315   CREATININE 1.63* 12/27/2013 1125   CALCIUM 8.6* 08/04/2015 0315   CALCIUM 8.9 12/27/2013 1125   GFRNONAA 30* 08/04/2015 0315   GFRNONAA 34* 12/27/2013 1125   GFRNONAA >60 02/04/2012 0448   GFRAA 35* 08/04/2015 0315   GFRAA 41* 12/27/2013 1125   GFRAA >60 02/04/2012 0448    Assessment/Planning: S/P Midfoot fusion for non-union   Will have PT evaluate   Appreciate IM input  Continue with current pain regimen.  STill c/o at 8/10 but sitting up and resting comfortably now.  Care management consulted.  [name] A  08/04/2015, 8:42 AM

## 2015-08-04 NOTE — Evaluation (Signed)
Physical Therapy Evaluation Patient Details Name: Michele Moore MRN: [id number] DOB: Feb 10, 1951 Today's Date: 08/04/2015   History of Present Illness  65 y/o female here s/p R mid foot fusion secondary to arthritis.  She was apparently going to have a back surgery but that was delayed secondary to foot.  Clinical Impression  Pt initially not very interested in working with PT, but does eventually agree that she needs to try.  Pt was able to do some light LE activities, getting to EOB and attempting to take a few small hopping steps in standing.  She is able to maintain NWBing on the R foot relatively well however she is very limited with being able to hop or even heel-toe L to do some standing mobility. Pt is, of course, highly reliant on UEs to do this and simply would be unable to manage/maintain safety at home - she will require rehab on discharge.    Follow Up Recommendations SNF    Equipment Recommendations   (Pt has walker, w/c, cane)    Recommendations for Other Services       Precautions / Restrictions Precautions Precautions: Fall Restrictions RLE Weight Bearing: Non weight bearing      Mobility  Bed Mobility Overal bed mobility: Modified Independent             General bed mobility comments: Pt shows good effort with getting to EOB.  She needs light assist to slide R LE, but is able to get up to sitting w/o direct assist.   Transfers Overall transfer level: Needs assistance Equipment used: Rolling walker (2 wheeled) Transfers: Sit to/from Stand Sit to Stand: Min assist;Mod assist         General transfer comment: Pt is able to keep weight off the R foot, but struggles to gather enough force to get to full standing w/o direct PT assist.  She showed good effort and ultimately was able to maintain standing balance well (with walker) once she got to upright.    Ambulation/Gait Ambulation/Gait assistance: Min assist Ambulation Distance (Feet): 3 Feet Assistive  device: Rolling walker (2 wheeled)       General Gait Details: Pt able to able to take a few side steps (more shuffling heel-toe) along EOB and then did do ~3 very small forward and back hopping steps with great effort.  Pt is not safe or confident with hopping/ambulation but did not have any LOBs.  Stairs            Wheelchair Mobility    Modified Rankin (Stroke Patients Only)       Balance                                             Pertinent Vitals/Pain Pain Assessment: 0-10 Pain Score: 8  Pain Location: R foot    Home Living Family/patient expects to be discharged to:: Skilled nursing facility Living Arrangements: Alone                    Prior Function Level of Independence: Independent with assistive device(s)         Comments: Pt reports that she is limited with how long she can be up but that she goes to grocery store, church, etc w/o too much issue     Hand Dominance        Extremity/Trunk Assessment  Upper Extremity Assessment: Overall WFL for tasks assessed           Lower Extremity Assessment: RLE deficits/detail (L LE WFL) RLE Deficits / Details: AAROM SLR, able to AD/AB hip against resistance, grossly        Communication   Communication: No difficulties  Cognition Arousal/Alertness: Awake/alert Behavior During Therapy: WFL for tasks assessed/performed Overall Cognitive Status: Within Functional Limits for tasks assessed                      General Comments      Exercises        Assessment/Plan    PT Assessment Patient needs continued PT services  PT Diagnosis Difficulty walking;Generalized weakness   PT Problem List Decreased strength;Decreased range of motion;Decreased activity tolerance;Decreased balance;Decreased mobility;Decreased coordination;Decreased knowledge of use of DME;Decreased safety awareness  PT Treatment Interventions DME instruction;Gait training;Stair  training;Functional mobility training;Therapeutic activities;Therapeutic exercise;Balance training;Neuromuscular re-education;Patient/family education   PT Goals (Current goals can be found in the Care Plan section) Acute Rehab PT Goals Patient Stated Goal: Get back to walking PT Goal Formulation: With patient Time For Goal Achievement: 08/18/15 Potential to Achieve Goals: Fair    Frequency 7X/week   Barriers to discharge        Co-evaluation               End of Session Equipment Utilized During Treatment: Gait belt Activity Tolerance: Patient limited by fatigue Patient left: with call bell/phone within reach;with bed alarm set           Time: 1252-1308 PT Time Calculation (min) (ACUTE ONLY): 16 min   Charges:   PT Evaluation $PT Eval Low Complexity: 1 Procedure     PT G CodesKreg Shropshire, DPT 08/04/2015, 2:27 PM

## 2015-08-04 NOTE — Progress Notes (Signed)
Patient is A&O x4, Cast and bone simulator to RLE. Toes are pink, warm, and patient is able to wiggle them.  IV fluids decreased this shift. Pain controled with scheduled meds, and one dose of Tylenol. No BM this shift, started Miralax. Good appetite. ABD soft with bowel sounds present. Passing flatus. Calls appropriately. Family at bedside most of the day.

## 2015-08-04 NOTE — Progress Notes (Signed)
Patient ID: Michele Moore, female   DOB: May 01, 1950, 65 y.o.   MRN: [id number] [facility] PROGRESS NOTE  Michele Moore [id number] DOB: 1951/01/14 DOA: 08/03/2015 PCP: Marden Noble, MD  HPI/Subjective: Patient has a headache. Patient has 6 out of 10 pain in her foot. Complained of her IV burning this morning.  Objective: Filed Vitals:   08/04/15 0752 08/04/15 1458  BP: 116/76 140/73  Pulse: 63 76  Temp: 98 F (36.7 C) 98.3 F (36.8 C)  Resp: 16 18    Filed Weights   08/04/15 0542  Weight: 108.863 kg (240 lb)    ROS: Review of Systems  Constitutional: Negative for fever and chills.  Eyes: Negative for blurred vision.  Respiratory: Negative for cough and shortness of breath.   Cardiovascular: Negative for chest pain.  Gastrointestinal: Negative for nausea, vomiting, abdominal pain, diarrhea and constipation.  Genitourinary: Negative for dysuria.  Musculoskeletal: Positive for joint pain.  Neurological: Positive for headaches. Negative for dizziness.   Exam: Physical Exam  Constitutional: She is oriented to person, place, and time.  HENT:  Nose: No mucosal edema.  Mouth/Throat: No oropharyngeal exudate or posterior oropharyngeal edema.  Eyes: Conjunctivae, EOM and lids are normal. Pupils are equal, round, and reactive to light.  Neck: No JVD present. Carotid bruit is not present. No edema present. No thyroid mass and no thyromegaly present.  Cardiovascular: S1 normal, S2 normal and normal heart sounds.  Exam reveals no gallop.   No murmur heard. Pulses:      Dorsalis pedis pulses are 2+ on the right side, and 2+ on the left side.  Respiratory: No respiratory distress. She has no wheezes. She has no rhonchi. She has no rales.  GI: Soft. Bowel sounds are normal. There is no tenderness.  Musculoskeletal:       Right shoulder: She exhibits no swelling.       Left ankle: She exhibits swelling.  Right ankle wrapped in large bandage.  Lymphadenopathy:    She has no  cervical adenopathy.  Neurological: She is alert and oriented to person, place, and time. No cranial nerve deficit.  Skin: Skin is warm. No rash noted. Nails show no clubbing.  Psychiatric: She has a normal mood and affect.      Data Reviewed: Basic Metabolic Panel:  Recent Labs Lab 07/30/15 1114 08/03/15 1602 08/04/15 0315  NA 141  --  138  K 4.4  --  4.4  CL 107  --  107  CO2 27  --  25  GLUCOSE 89  --  126*  BUN 32*  --  28*  CREATININE 1.95* 1.76* 1.71*  CALCIUM 9.2  --  8.6*   CBC:  Recent Labs Lab 07/30/15 1114 08/03/15 1602  WBC 4.7 6.0  HGB 10.3* 10.6*  HCT 31.6* 32.4*  MCV 84.1 82.4  PLT 166 150    Scheduled Meds: . aspirin  81 mg Oral Daily  . enoxaparin (LOVENOX) injection  40 mg Subcutaneous Q24H  . famotidine  20 mg Oral BID  . gabapentin  600 mg Oral QHS  . hydrALAZINE  50 mg Oral BID  . HYDROcodone-acetaminophen  1 tablet Oral BID  . levothyroxine  150 mcg Oral QAC breakfast  . omega-3 acid ethyl esters  2 g Oral BID  . pantoprazole  40 mg Oral Daily  . polyethylene glycol  17 g Oral Daily  . potassium chloride SA  20-40 mEq Oral Daily  . triamterene-hydrochlorothiazide  2 tablet Oral  Daily   Continuous Infusions: . sodium chloride 75 mL/hr at 08/04/15 0548    Assessment/Plan:  1. Acute kidney injury. Gentle IV fluids given. Baseline creatinine 1.63. Today's creatinine 1.71. 2. Essential hypertension. Continue usual medication 3. Hypothyroidism unspecified continue levothyroxine 4. GERD on famotidine 5. History of constipation on MiraLAX 6. Podiatry follow-up for surgery on the right foot. Pain control and physical therapy.  Code Status:     Code Status Orders        Start     Ordered   08/03/15 1504  Full code   Continuous     08/03/15 1503    Code Status History    Date Active Date Inactive Code Status Order ID Comments User Context   This patient has a current code status but no historical code status.     Disposition  Plan: Likely will need rehabilitation  Consultants:  Podiatry  Procedures:  Surgery on right foot please see operative report  Time spent: 23 minutes  Loletha Grayer  [employer]

## 2015-08-05 LAB — BASIC METABOLIC PANEL
ANION GAP: 5 (ref 5–15)
BUN: 33 mg/dL — AB (ref 6–20)
CALCIUM: 8.6 mg/dL — AB (ref 8.9–10.3)
CO2: 25 mmol/L (ref 22–32)
Chloride: 111 mmol/L (ref 101–111)
Creatinine, Ser: 1.71 mg/dL — ABNORMAL HIGH (ref 0.44–1.00)
GFR calc Af Amer: 35 mL/min — ABNORMAL LOW (ref >60)
GFR, EST NON AFRICAN AMERICAN: 30 mL/min — AB (ref >60)
GLUCOSE: 103 mg/dL — AB (ref 65–99)
Potassium: 4.1 mmol/L (ref 3.5–5.1)
SODIUM: 141 mmol/L (ref 135–145)

## 2015-08-05 MED ORDER — MAGNESIUM HYDROXIDE 400 MG/5ML PO SUSP
5.0000 mL | Freq: Every day | ORAL | Status: DC | PRN
  Administered 2015-08-06: 5 mL via ORAL
  Filled 2015-08-05: qty 30

## 2015-08-05 MED ORDER — OXYCODONE HCL 5 MG PO TABS
5.0000 mg | ORAL_TABLET | ORAL | Status: DC | PRN
  Administered 2015-08-05 – 2015-08-06 (×2): 5 mg via ORAL
  Filled 2015-08-05 (×2): qty 1

## 2015-08-05 NOTE — Progress Notes (Signed)
Daily Progress Note   Subjective  - 2 Days Post-Op  POD # 2 s/p midfoot fusion with bone grafting, external bone stimulator for non-union.  Pt still requireing IV pain meds.  Popliteal block wore off and pan increased over last 24 hours.  PT evaluation noted.  No SOB, No CP  Objective Filed Vitals:   08/04/15 1458 08/04/15 1958 08/05/15 0453 08/05/15 0735  BP: 140/73 123/65 105/52 122/63  Pulse: 76 54 52 56  Temp: 98.3 F (36.8 C) 98.3 F (36.8 C) 97.9 F (36.6 C) 98.1 F (36.7 C)  TempSrc: Oral Oral Oral Oral  Resp: 18 16 16 18   Height:      Weight:      SpO2: 100% 100% 95% 98%    Physical Exam: Toes with brisk CFT and sensation intact.  Laboratory CBC    Component Value Date/Time   WBC 6.0 08/03/2015 1602   WBC 6.8 12/27/2013 1125   HGB 10.6* 08/03/2015 1602   HGB 9.5* 12/27/2013 1125   HCT 32.4* 08/03/2015 1602   HCT 29.5* 12/27/2013 1125   PLT 150 08/03/2015 1602   PLT 214 12/27/2013 1125    BMET    Component Value Date/Time   NA 141 08/05/2015 0331   NA 142 12/27/2013 1125   K 4.1 08/05/2015 0331   K 3.8 12/27/2013 1125   CL 111 08/05/2015 0331   CL 107 12/27/2013 1125   CO2 25 08/05/2015 0331   CO2 29 12/27/2013 1125   GLUCOSE 103* 08/05/2015 0331   GLUCOSE 107* 12/27/2013 1125   BUN 33* 08/05/2015 0331   BUN 25* 12/27/2013 1125   CREATININE 1.71* 08/05/2015 0331   CREATININE 1.63* 12/27/2013 1125   CALCIUM 8.6* 08/05/2015 0331   CALCIUM 8.9 12/27/2013 1125   GFRNONAA 30* 08/05/2015 0331   GFRNONAA 34* 12/27/2013 1125   GFRNONAA >60 02/04/2012 0448   GFRAA 35* 08/05/2015 0331   GFRAA 41* 12/27/2013 1125   GFRAA >60 02/04/2012 0448    Assessment/Planning: Pt s/p midfoot reconstruction with bone graft and external bone stim   PT evaluation recommends SNF as pt not safe for home.  Cannot perform ADL's and relying on upper ext for transfer.  Gait is very unstable and pt is obese.  High fall risk if d/c'd home  Pain is still an issue and will  continue to monitor.  IM is following CRF.  Continue with Miralax.  No BM today or yesterday.  Will f/u tomorrow and hopefully d/c to SNF if bed available and pt stable.    Michele Moore A  08/05/2015, 8:39 AM

## 2015-08-05 NOTE — Progress Notes (Signed)
Patient ID: Michele Moore, female   DOB: 14-Oct-1950, 65 y.o.   MRN: [id number] [facility] PROGRESS NOTE  Michele Moore DOB: 07-14-1950 DOA: 08/03/2015 PCP: Michele Noble, MD  HPI/Subjective: Other than the pain in her foot she feels well. She is able to bend her toes today. Wanted more pain today than yesterday secondary to nerve block wearing off.  Objective: Filed Vitals:   08/05/15 0453 08/05/15 0735  BP: 105/52 122/63  Pulse: 52 56  Temp: 97.9 F (36.6 C) 98.1 F (36.7 C)  Resp: 16 18    Filed Weights   08/04/15 0542  Weight: 108.863 kg (240 lb)    ROS: Review of Systems  Constitutional: Negative for fever and chills.  Eyes: Negative for blurred vision.  Respiratory: Negative for cough and shortness of breath.   Cardiovascular: Negative for chest pain.  Gastrointestinal: Positive for constipation. Negative for nausea, vomiting, abdominal pain and diarrhea.  Genitourinary: Negative for dysuria.  Musculoskeletal: Positive for joint pain.  Neurological: Negative for dizziness and headaches.   Exam: Physical Exam  Constitutional: She is oriented to person, place, and time.  HENT:  Nose: No mucosal edema.  Mouth/Throat: No oropharyngeal exudate or posterior oropharyngeal edema.  Eyes: Conjunctivae, EOM and lids are normal. Pupils are equal, round, and reactive to light.  Neck: No JVD present. Carotid bruit is not present. No edema present. No thyroid mass and no thyromegaly present.  Cardiovascular: S1 normal, S2 normal and normal heart sounds.  Exam reveals no gallop.   No murmur heard. Pulses:      Dorsalis pedis pulses are 2+ on the right side, and 2+ on the left side.  Respiratory: No respiratory distress. She has no wheezes. She has no rhonchi. She has no rales.  GI: Soft. Bowel sounds are normal. There is no tenderness.  Musculoskeletal:       Right shoulder: She exhibits no swelling.       Left ankle: She exhibits swelling.  Right ankle  wrapped in large bandage.  Lymphadenopathy:    She has no cervical adenopathy.  Neurological: She is alert and oriented to person, place, and time. No cranial nerve deficit.  Skin: Skin is warm. No rash noted. Nails show no clubbing.  Psychiatric: She has a normal mood and affect.      Data Reviewed: Basic Metabolic Panel:  Recent Labs Lab 07/30/15 1114 08/03/15 1602 08/04/15 0315 08/05/15 0331  NA 141  --  138 141  K 4.4  --  4.4 4.1  CL 107  --  107 111  CO2 27  --  25 25  GLUCOSE 89  --  126* 103*  BUN 32*  --  28* 33*  CREATININE 1.95* 1.76* 1.71* 1.71*  CALCIUM 9.2  --  8.6* 8.6*   CBC:  Recent Labs Lab 07/30/15 1114 08/03/15 1602  WBC 4.7 6.0  HGB 10.3* 10.6*  HCT 31.6* 32.4*  MCV 84.1 82.4  PLT 166 150    Scheduled Meds: . aspirin  81 mg Oral Daily  . enoxaparin (LOVENOX) injection  40 mg Subcutaneous Q24H  . famotidine  20 mg Oral BID  . gabapentin  600 mg Oral QHS  . hydrALAZINE  50 mg Oral BID  . HYDROcodone-acetaminophen  1 tablet Oral BID  . levothyroxine  150 mcg Oral QAC breakfast  . omega-3 acid ethyl esters  2 g Oral BID  . pantoprazole  40 mg Oral Daily  . polyethylene glycol  17  g Oral Daily  . potassium chloride SA  20-40 mEq Oral Daily  . triamterene-hydrochlorothiazide  2 tablet Oral Daily    Assessment/Plan:  1. Acute kidney injury.  Baseline creatinine 1.63. Today's creatinine 1.71.Stop IV fluids  2. Essential hypertension. Continue usual medication 3. Hypothyroidism unspecified continue levothyroxine 4. GERD on famotidine 5. History of constipation on MiraLAX 6. Podiatry follow-up for surgery on the right foot. Pain control and physical therapy.  Code Status:     Code Status Orders        Start     Ordered   08/03/15 1504  Full code   Continuous     08/03/15 1503    Code Status History    Date Active Date Inactive Code Status Order ID Comments User Context   This patient has a current code status but no historical  code status.     Disposition Plan: Will need rehabilitation  Consultants:  Podiatry  Procedures:  Right foot fusion  Time spent: 20 minutes  Michele Moore, Michele Moore

## 2015-08-05 NOTE — Care Management Important Message (Signed)
Important Message  Patient Details  Name: Michele Moore MRN: [id number] Date of Birth: 07/13/1950   Medicare Important Message Given:  Yes    Vivica Dobosz A, RN 08/05/2015, 3:00 PM

## 2015-08-05 NOTE — Progress Notes (Signed)
Physical Therapy Treatment Patient Details Name: Michele Moore MRN: [id number] DOB: October 23, 1950 Today's Date: 08/05/2015    History of Present Illness 65 y/o female here s/p R mid foot fusion secondary to arthritis.  She was apparently going to have a back surgery but that was delayed secondary to foot.    PT Comments    Pt did well with limited in-room ambulation today with good use of walker, good ability to keep weight off R foot and overall good sense of safety and limitations.  She had some fatigue with the effort of 10 ft of ambulation getting to recliner, but overall did better and reports feeling pretty good despite having more pain today than yesterday.  Follow Up Recommendations  SNF     Equipment Recommendations       Recommendations for Other Services       Precautions / Restrictions Precautions Precautions: Fall Restrictions Weight Bearing Restrictions: Yes RLE Weight Bearing: Non weight bearing    Mobility  Bed Mobility Overal bed mobility: Modified Independent             General bed mobility comments: Pt able to get sitting up at EOB w/o assist.  Transfers Overall transfer level: Needs assistance Equipment used: Rolling walker (2 wheeled) Transfers: Sit to/from Stand Sit to Stand: Min assist         General transfer comment: Pt better able to use UEs to rise to standing, once up she was able to use walker to maintain balance safely  Ambulation/Gait Ambulation/Gait assistance: Min assist Ambulation Distance (Feet): 10 Feet Assistive device: Rolling walker (2 wheeled)       General Gait Details: Pt is able to take small, but stable hopping steps with good confidence and general ability to go forward and back with good relative control.   Stairs            Wheelchair Mobility    Modified Rankin (Stroke Patients Only)       Balance Overall balance assessment: Modified Independent (with walker)                                   Cognition Arousal/Alertness: Awake/alert Behavior During Therapy: WFL for tasks assessed/performed Overall Cognitive Status: Within Functional Limits for tasks assessed                      Exercises General Exercises - Lower Extremity Ankle Circles/Pumps: 10 reps (isometric ) Quad Sets: Strengthening;15 reps Gluteal Sets: Strengthening;15 reps Short Arc Quad: AROM;10 reps Hip ABduction/ADduction: Strengthening;10 reps Straight Leg Raises: AROM;10 reps;AAROM    General Comments        Pertinent Vitals/Pain Pain Score: 5     Home Living                      Prior Function            PT Goals (current goals can now be found in the care plan section) Progress towards PT goals: Progressing toward goals    Frequency  7X/week    PT Plan Current plan remains appropriate    Co-evaluation             End of Session Equipment Utilized During Treatment: Gait belt Activity Tolerance: Patient tolerated treatment well Patient left: with chair alarm set;with call bell/phone within reach     Time: 0939-1005 PT Time Calculation (min) (ACUTE  ONLY): 26 min  Charges:  $Gait Training: 8-22 mins $Therapeutic Exercise: 8-22 mins                    G Codes:      Kreg Shropshire, DPT  08/05/2015, 12:14 PM

## 2015-08-05 NOTE — NC FL2 (Signed)
[place] LEVEL OF CARE SCREENING TOOL     IDENTIFICATION  Patient Name: Michele Moore Birthdate: 1950-05-02 Sex: female Admission Date (Current Location): 08/03/2015  [facility] and [place] Number:  Michele Moore  ([id number]) Facility and Address:  [facility], [address] [place], [place] [address]      Provider Number: [id number]  Attending Physician Name and Address:  [name], DPM  Relative Name and Phone Number:       Current Level of Care: Hospital Recommended Level of Care: [place] Prior Approval Number:    Date Approved/Denied:   PASRR Number:  ([id number])  Discharge Plan: SNF    Current Diagnoses: Patient Active Problem List   Diagnosis Date Noted  . Pain, lower extremity 08/03/2015    Orientation RESPIRATION BLADDER Height & Weight     Self, Time, Situation, Place  Normal Continent Weight: 240 lb (108.863 kg) Height:  5\' 11"  (180.3 cm)  BEHAVIORAL SYMPTOMS/MOOD NEUROLOGICAL BOWEL NUTRITION STATUS   (None)   Continent Diet (Regular)  AMBULATORY STATUS COMMUNICATION OF NEEDS Skin   Extensive Assist Verbally Other (Comment) (Close Incision Right Foot)                       Personal Care Assistance Level of Assistance  Bathing, Feeding, Dressing Bathing Assistance: Limited assistance Feeding assistance: Independent Dressing Assistance: Limited assistance     Functional Limitations Info  Sight, Hearing, Speech Sight Info: Adequate Hearing Info: Adequate Speech Info: Adequate    SPECIAL CARE FACTORS FREQUENCY  PT (By licensed PT)     PT Frequency:  (5)              Contractures      Additional Factors Info  Code Status, Allergies Code Status Info:  (Full Code) Allergies Info:  (Penicillins, Vibramycin)           Current Medications (08/05/2015):  This is the current hospital active medication list Current Facility-Administered Medications  Medication Dose Route  Frequency Provider Last Rate Last Dose  . acetaminophen (TYLENOL) tablet 650 mg  650 mg Oral Q6H PRN Dustin Flock, MD   650 mg at 08/04/15 1534  . aspirin chewable tablet 81 mg  81 mg Oral Daily [name], DPM   81 mg at 08/05/15 0911  . enoxaparin (LOVENOX) injection 40 mg  40 mg Subcutaneous Q24H [name], DPM   40 mg at 08/04/15 2220  . famotidine (PEPCID) tablet 20 mg  20 mg Oral BID [name], DPM   20 mg at 08/05/15 0911  . gabapentin (NEURONTIN) capsule 600 mg  600 mg Oral QHS [name], DPM   600 mg at 08/04/15 2220  . hydrALAZINE (APRESOLINE) tablet 50 mg  50 mg Oral BID [name], DPM   50 mg at 08/05/15 0911  . HYDROcodone-acetaminophen (NORCO/VICODIN) 5-325 MG per tablet 1 tablet  1 tablet Oral BID [name], DPM   1 tablet at 08/05/15 0911  . HYDROmorphone (DILAUDID) injection 1 mg  1 mg Intravenous Q3H PRN Dustin Flock, MD   1 mg at 08/05/15 1148  . levothyroxine (SYNTHROID, LEVOTHROID) tablet 150 mcg  150 mcg Oral QAC breakfast [name], DPM   150 mcg at 08/05/15 0847  . magnesium hydroxide (MILK OF MAGNESIA) suspension 5 mL  5 mL Oral Daily PRN Loletha Grayer, MD      . omega-3 acid ethyl esters (LOVAZA) capsule 2 g  2 g Oral BID [name], DPM  2 g at 08/05/15 0911  . oxyCODONE (Oxy IR/ROXICODONE) immediate release tablet 5 mg  5 mg Oral Q4H PRN Loletha Grayer, MD   5 mg at 08/05/15 1455  . pantoprazole (PROTONIX) EC tablet 40 mg  40 mg Oral Daily [name], DPM   40 mg at 08/05/15 0911  . polyethylene glycol (MIRALAX / GLYCOLAX) packet 17 g  17 g Oral Daily Loletha Grayer, MD   17 g at 08/05/15 0910  . polyethylene glycol 0.4% and propylene glycol 0.3% (SYSTANE) ophthalmic gel   Both Eyes PRN [name], DPM      . potassium chloride SA (K-DUR,KLOR-CON) CR tablet 20-40 mEq  20-40 mEq Oral Daily [name], DPM   20 mEq at 08/05/15 0911  . tiZANidine (ZANAFLEX) tablet 4 mg  4 mg Oral QHS PRN [name], DPM      .  triamterene-hydrochlorothiazide (MAXZIDE-25) 37.5-25 MG per tablet 2 tablet  2 tablet Oral Daily [name], DPM   2 tablet at 08/05/15 0911     Discharge Medications: Please see discharge summary for a list of discharge medications.  Relevant Imaging Results:  Relevant Lab Results:   Additional Information  (SSN [id number])  Lorenso Quarry Valeriano Bain, LCSW

## 2015-08-05 NOTE — Progress Notes (Signed)
Pt. Alert and oriented. VSS. Pain in right ankle has been severe throughout the night. Controlled with pain meds per MAR. Ankle elevated up on pillows and stimulator on. Neurochecks WDL. Voiding good amounts. Just received pain meds and starting to rest comfortable at this time. Will continue to monitor.

## 2015-08-06 MED ORDER — OXYCODONE HCL 5 MG PO TABS
10.0000 mg | ORAL_TABLET | ORAL | Status: DC | PRN
  Administered 2015-08-06 – 2015-08-07 (×5): 10 mg via ORAL
  Filled 2015-08-06 (×5): qty 2

## 2015-08-06 MED ORDER — POTASSIUM CHLORIDE CRYS ER 10 MEQ PO TBCR
10.0000 meq | EXTENDED_RELEASE_TABLET | Freq: Every day | ORAL | Status: DC
  Administered 2015-08-07: 10 meq via ORAL
  Filled 2015-08-06: qty 1

## 2015-08-06 MED ORDER — FAMOTIDINE 20 MG PO TABS
20.0000 mg | ORAL_TABLET | Freq: Every day | ORAL | Status: DC
  Administered 2015-08-07: 20 mg via ORAL
  Filled 2015-08-06: qty 1

## 2015-08-06 NOTE — Progress Notes (Signed)
Patient ID: Michele Moore, female   DOB: 06-Oct-1950, 65 y.o.   MRN: [id number] [employer] PROGRESS NOTE  Michele Moore [id number] DOB: [birth year] DOA: 08/03/2015 PCP: Marden Noble, MD  HPI/Subjective: Patient had a rough night with regaurds to pain, she did not sleep well. Pain 10/10 right foot, feels like it is swollen, cant feel her 1st toe  Objective: Filed Vitals:   08/06/15 0433 08/06/15 0757  BP: 117/63 143/66  Pulse: 65 59  Temp: 98.2 F (36.8 C) 98.8 F (37.1 C)  Resp: 16 18    Filed Weights   08/04/15 0542  Weight: 108.863 kg (240 lb)    ROS: Review of Systems  Constitutional: Negative for fever and chills.  Eyes: Negative for blurred vision.  Respiratory: Negative for cough and shortness of breath.   Cardiovascular: Negative for chest pain.  Gastrointestinal: Positive for constipation. Negative for nausea, vomiting, abdominal pain and diarrhea.  Genitourinary: Negative for dysuria.  Musculoskeletal: Positive for joint pain.  Neurological: Negative for dizziness and headaches.   Exam: Physical Exam  Constitutional: She is oriented to person, place, and time.  HENT:  Nose: No mucosal edema.  Mouth/Throat: No oropharyngeal exudate or posterior oropharyngeal edema.  Eyes: Conjunctivae, EOM and lids are normal. Pupils are equal, round, and reactive to light.  Neck: No JVD present. Carotid bruit is not present. No edema present. No thyroid mass and no thyromegaly present.  Cardiovascular: S1 normal, S2 normal and normal heart sounds.  Exam reveals no gallop.   No murmur heard. Pulses:      Dorsalis pedis pulses are 2+ on the left side.  Respiratory: No respiratory distress. She has no wheezes. She has no rhonchi. She has no rales.  GI: Soft. Bowel sounds are normal. There is no tenderness.  Musculoskeletal:       Left ankle: She exhibits swelling.  Right ankle wrapped in large bandage. Able to wiggle toes right foot.  Lymphadenopathy:    She has no  cervical adenopathy.  Neurological: She is alert and oriented to person, place, and time. No cranial nerve deficit.  Skin: Skin is warm. No rash noted. Nails show no clubbing.  Psychiatric: She has a normal mood and affect.      Data Reviewed: Basic Metabolic Panel:  Recent Labs Lab 08/03/15 1602 08/04/15 0315 08/05/15 0331  NA  --  138 141  K  --  4.4 4.1  CL  --  107 111  CO2  --  25 25  GLUCOSE  --  126* 103*  BUN  --  28* 33*  CREATININE 1.76* 1.71* 1.71*  CALCIUM  --  8.6* 8.6*   CBC:  Recent Labs Lab 08/03/15 1602  WBC 6.0  HGB 10.6*  HCT 32.4*  MCV 82.4  PLT 150    Scheduled Meds: . aspirin  81 mg Oral Daily  . enoxaparin (LOVENOX) injection  40 mg Subcutaneous Q24H  . famotidine  20 mg Oral BID  . gabapentin  600 mg Oral QHS  . hydrALAZINE  50 mg Oral BID  . HYDROcodone-acetaminophen  1 tablet Oral BID  . levothyroxine  150 mcg Oral QAC breakfast  . omega-3 acid ethyl esters  2 g Oral BID  . pantoprazole  40 mg Oral Daily  . polyethylene glycol  17 g Oral Daily  . potassium chloride SA  20-40 mEq Oral Daily  . triamterene-hydrochlorothiazide  2 tablet Oral Daily    Assessment/Plan:  1. Acute kidney injury.  Back  to baseline 2. Essential hypertension. Continue usual medication 3. Hypothyroidism unspecified continue levothyroxine 4. GERD on famotidine 5. History of constipation on MiraLAX. Had BM this am 6. Podiatry follow-up for surgery on the right foot. Pain control and physical therapy. Increase oral oxycodone to 10mg  po severe pain and 5 mg for moderate pain.  Podiatry to decide when patient can go to rehab.  Code Status:     Code Status Orders        Start     Ordered   08/03/15 1504  Full code   Continuous     08/03/15 1503    Code Status History    Date Active Date Inactive Code Status Order ID Comments User Context   This patient has a current code status but no historical code status.     Disposition Plan: Can go to  rehabilitation when podiatry decides she is okay.  Consultants:  Podiatry  Procedures:  Right foot fusion  Time spent: 20 minutes  [place], [place]

## 2015-08-06 NOTE — Progress Notes (Signed)
Daily Progress Note   Subjective  - 3 Days Post-Op  POD # 3.  Pt still c/o pain but resting comfortably upon entry.  C/o tightness at surgery site.  No calf pain or SOB.  Objective Filed Vitals:   08/06/15 0433 08/06/15 0757 08/06/15 1423 08/06/15 1532  BP: 117/63 143/66 124/67   Pulse: 65 59 65 63  Temp: 98.2 F (36.8 C) 98.8 F (37.1 C) 97.8 F (36.6 C)   TempSrc: Oral Oral Oral   Resp: 16 18 18    Height:      Weight:      SpO2: 95% 97% 99% 97%    Physical Exam: Splint intact.  Loosened splint and bandage slightly.  Some residual numbness to great toe but intact to remaining toes.  Demonstrated active dorsi and plantar flexion of toes.  Good CFT.  Laboratory CBC    Component Value Date/Time   WBC 6.0 08/03/2015 1602   WBC 6.8 12/27/2013 1125   HGB 10.6* 08/03/2015 1602   HGB 9.5* 12/27/2013 1125   HCT 32.4* 08/03/2015 1602   HCT 29.5* 12/27/2013 1125   PLT 150 08/03/2015 1602   PLT 214 12/27/2013 1125    BMET    Component Value Date/Time   NA 141 08/05/2015 0331   NA 142 12/27/2013 1125   K 4.1 08/05/2015 0331   K 3.8 12/27/2013 1125   CL 111 08/05/2015 0331   CL 107 12/27/2013 1125   CO2 25 08/05/2015 0331   CO2 29 12/27/2013 1125   GLUCOSE 103* 08/05/2015 0331   GLUCOSE 107* 12/27/2013 1125   BUN 33* 08/05/2015 0331   BUN 25* 12/27/2013 1125   CREATININE 1.71* 08/05/2015 0331   CREATININE 1.63* 12/27/2013 1125   CALCIUM 8.6* 08/05/2015 0331   CALCIUM 8.9 12/27/2013 1125   GFRNONAA 30* 08/05/2015 0331   GFRNONAA 34* 12/27/2013 1125   GFRNONAA >60 02/04/2012 0448   GFRAA 35* 08/05/2015 0331   GFRAA 41* 12/27/2013 1125   GFRAA >60 02/04/2012 0448    Assessment/Planning: Pain present but states po pain meds have helped.  Pt should be ready for SNF in am.   C/W PT   Will f/u with me in outpt clinic in 1 week.    [name] A  08/06/2015, 6:29 PM

## 2015-08-06 NOTE — Plan of Care (Signed)
Problem: Pain Managment: Goal: General experience of comfort will improve Outcome: Not Progressing Patient is still complaining of pain 8 out of 10 even after receiving IV and PO pain meds. MD aware and has changed orders for pain meds.   Deri Fuelling, RN

## 2015-08-06 NOTE — Care Management Note (Signed)
Case Management Note  Patient Details  Name: Michele Moore MRN: [id number] Date of Birth: [birth year]  Subjective/Objective:     Current discharge plan is for SNF. Social Work following for placement. [facility]-PT recommending SNF at this time.                Action/Plan:   Expected Discharge Date:                  Expected Discharge Plan:     In-House Referral:     Discharge planning Services     Post Acute Care Choice:    Choice offered to:     DME Arranged:    DME Agency:     HH Arranged:    [place] Agency:     Status of Service:     Medicare Important Message Given:  Yes Date Medicare IM Given:    Medicare IM give by:    Date Additional Medicare IM Given:    Additional Medicare Important Message give by:     If discussed at [place] of Stay Meetings, dates discussed:    Additional Comments:  Andrew Blasius A, RN 08/06/2015, 9:32 AM

## 2015-08-06 NOTE — Clinical Social Work Note (Signed)
Clinical Social Work Assessment  Patient Details  Name: Michele Moore MRN: [id number] Date of Birth: 1951/01/05  Date of referral:  08/06/15               Reason for consult:  Facility Placement                Permission sought to share information with:  Family Supports Permission granted to share information::  Yes, Verbal Permission Granted  Name::     Karesha Trzcinski, daughter   Housing/Transportation Living arrangements for the past 2 months:  Single Family Home Source of Information:  Patient Patient Interpreter Needed:  None Criminal Activity/Legal Involvement Pertinent to Current Situation/Hospitalization:  No - Comment as needed Significant Relationships:  Other Family Members, Adult Children Lives with:  Self Do you feel safe going back to the place where you live?  Yes Need for family participation in patient care:  No (Coment)  Care giving concerns:  No care giving needs identified.   Social Worker assessment / plan:  CSW met with pt to address consult for SNF. CSW introduced herself and explained role of social work. CSW also explained the process of discharging to SNF. Pt lives alone and does have supportive family, but her children work during the day. Pt shared that she has been to SNF in the past and is familiar with the process. SNF search was initiated. CSW provided bed offers. Pt chose [facility]. CSW udpated facility and they have a bed available when pt is discharged. CSW spoke with MD. Pt will be discharged tomorrow. CSW will continue to follow.   Employment status:  Retired Forensic scientist:  Medicare PT Recommendations:  [place] / Referral to community resources:  [place]  Patient/Family's Response to care:  Pt was appreciative of CSW support.   Patient/Family's Understanding of and Emotional Response to Diagnosis, Current Treatment, and Prognosis:  Pt understands that STR will benefit her greatly prior to  returning home.   Emotional Assessment Appearance:  Appears stated age Attitude/Demeanor/Rapport:  Other (Appropriate) Affect (typically observed):  Accepting, Adaptable, Pleasant Orientation:  Oriented to Self, Oriented to Place, Oriented to  Time, Oriented to Situation Alcohol / Substance use:  Never Used Psych involvement (Current and /or in the community):  No (Comment)  Discharge Needs  Concerns to be addressed:  Adjustment to Illness Readmission within the last 30 days:  No Current discharge risk:  None Barriers to Discharge:  Continued Medical Work up   [employer], LCSW 08/06/2015, 2:50 PM

## 2015-08-06 NOTE — Discharge Summary (Addendum)
Physician Discharge Summary  Patient ID: Michele Moore MRN: [id number] DOB/AGE: [birth year] 65 y.o.  Admit date: 08/03/2015 Discharge date: 08/07/2015  Admission Diagnoses:  <principal problem not specified>  Discharge Diagnoses:  Active Problems:   Pain, lower extremity   Past Medical History  Diagnosis Date  . Arthritis   . Thyroid disease   . Anemia   . GERD (gastroesophageal reflux disease)   . Hemorrhoid   . Multiple gastric ulcers   . Hypertension   . Mixed hyperlipidemia   . Migraines   . History of hiatal hernia   . Family history of adverse reaction to anesthesia     sister difficult to put to sleep  . Chronic kidney disease     STAGE 3 PER DR EASON 08/02/15    Surgeries: Procedure(s): TAILOR NAVICULAR JOINT FUSION - RIGHT  on 08/03/2015   Consultants (if any): Treatment Team:  Loletha Grayer, MD  Discharged Condition: Improved  Hospital Course: Michele Moore is an 65 y.o. female who was admitted 08/03/2015 with a diagnosis of <principal problem not specified> and went to the operating room on 08/03/2015 and underwent the above named procedures.    She was given perioperative antibiotics:      Anti-infectives    Start     Dose/Rate Route Frequency Ordered Stop   08/03/15 1025  clindamycin (CLEOCIN) 600 MG/50ML IVPB    Comments:  Rexanne Mano: cabinet override      08/03/15 1025 08/03/15 1059   08/03/15 0100  clindamycin (CLEOCIN) IVPB 600 mg  Status:  Discontinued     600 mg 100 mL/hr over 30 Minutes Intravenous  Once 08/03/15 0058 08/03/15 1451    .  She was given sequential compression devices, early ambulation, and lovenox for DVT prophylaxis.  She benefited maximally from the hospital stay and there were no complications.    Recent vital signs:  Filed Vitals:   08/07/15 0509 08/07/15 0805  BP: 131/66 138/71  Pulse: 63 62  Temp: 98.5 F (36.9 C) 98 F (36.7 C)  Resp:  20    Recent laboratory studies:  Lab Results  Component Value Date   HGB 10.6* 08/03/2015   HGB 10.3* 07/30/2015   HGB 9.5* 12/27/2013   Lab Results  Component Value Date   WBC 6.0 08/03/2015   PLT 150 08/03/2015   No results found for: INR Lab Results  Component Value Date   NA 141 08/05/2015   K 4.1 08/05/2015   CL 111 08/05/2015   CO2 25 08/05/2015   BUN 33* 08/05/2015   CREATININE 1.71* 08/05/2015   GLUCOSE 103* 08/05/2015    Discharge Medications:     Medication List    TAKE these medications        aspirin 81 MG tablet  Take 81 mg by mouth daily.     enoxaparin 40 MG/0.4ML injection  Commonly known as:  LOVENOX  Inject 0.4 mLs (40 mg total) into the skin daily.  Start taking on:  08/17/2015     gabapentin 300 MG capsule  Commonly known as:  NEURONTIN  Take 600 mg by mouth at bedtime.     hydrALAZINE 50 MG tablet  Commonly known as:  APRESOLINE  Take 50 mg by mouth 2 (two) times daily.     HYDROcodone-acetaminophen 5-325 MG tablet  Commonly known as:  NORCO/VICODIN  Take 1-2 tablets by mouth every 6 (six) hours as needed for moderate pain.     meloxicam 15 MG tablet  Commonly known  as:  MOBIC  Take 1 tablet (15 mg total) by mouth daily.     omega-3 acid ethyl esters 1 g capsule  Commonly known as:  LOVAZA  Take 2 g by mouth 2 (two) times daily.     omeprazole 40 MG capsule  Commonly known as:  PRILOSEC  Take 40 mg by mouth daily.     polyethylene glycol packet  Commonly known as:  MIRALAX / GLYCOLAX  Take 17 g by mouth daily.     potassium chloride SA 20 MEQ tablet  Commonly known as:  K-DUR,KLOR-CON  Take 20-40 mEq by mouth daily.     ranitidine 150 MG tablet  Commonly known as:  ZANTAC  Take 150 mg by mouth 2 (two) times daily.     SYNTHROID 150 MCG tablet  Generic drug:  levothyroxine  Take 150 mcg by mouth daily before breakfast.     SYSTANE OP  Apply to eye.     tiZANidine 4 MG capsule  Commonly known as:  ZANAFLEX  Take 4 mg by mouth at bedtime as needed.     triamterene-hydrochlorothiazide  75-50 MG tablet  Commonly known as:  MAXZIDE  Take 1 tablet by mouth daily.     TYLENOL PO  Take 500 mg by mouth as needed.        Diagnostic Studies: No results found.  Disposition: SNF  Discharge Instructions    Diet general    Complete by:  As directed      Leave dressing on - Keep it clean, dry, and intact until clinic visit    Complete by:  As directed      Walk with assistance    Complete by:  As directed   Pt NWB to operative foot.           Apply Bone stimulator 3 hours per day.  Charge daily,.   Signed: [name] A 08/07/2015, 8:21 AM

## 2015-08-06 NOTE — Progress Notes (Signed)
Pts. Left ankle has been throbbing throughout the night. Relieved with pain meds per MAR. Ankle propped up on 2 pillows with bone stimulator charged and on pt. Running. Will continue to monitor.

## 2015-08-06 NOTE — Progress Notes (Signed)
Physical Therapy Treatment Patient Details Name: Michele Moore MRN: [id number] DOB: 20-Feb-1951 Today's Date: 08/06/2015    History of Present Illness 65 y/o female here s/p R mid foot fusion secondary to arthritis.  She was apparently going to have a back surgery but that was delayed secondary to foot.    PT Comments    Pt notes being up in chair earlier today and has just returned to bed. Pt agreeable to exercises in bed. Pt participates well with resistance on Left lower extremity and active to active assisted range of motion on right. Educated pt on elevation, as pt questions about swelling and pain with swelling when leg is down. Continue PT to progress strength to allow for transfers and ambulation with right non weightbearing status to improve functional mobility.   Follow Up Recommendations  SNF     Equipment Recommendations       Recommendations for Other Services       Precautions / Restrictions Precautions Precautions: Fall Restrictions Weight Bearing Restrictions: Yes RLE Weight Bearing: Non weight bearing    Mobility  Bed Mobility               General bed mobility comments: Not tested; pt refuses up at this time.   Transfers                    Ambulation/Gait                 Stairs            Wheelchair Mobility    Modified Rankin (Stroke Patients Only)       Balance                                    Cognition Arousal/Alertness: Awake/alert Behavior During Therapy: WFL for tasks assessed/performed Overall Cognitive Status: Within Functional Limits for tasks assessed                      Exercises General Exercises - Lower Extremity Ankle Circles/Pumps: AROM;Left;20 reps;Supine Quad Sets: Strengthening;Both;20 reps;Supine Gluteal Sets: Strengthening;Both;20 reps;Supine Short Arc Quad: AROM;Strengthening;Both;20 reps;Supine Heel Slides: AROM;Right;20 reps;Supine (Strengthening LLE 20x) Hip  ABduction/ADduction: AROM;Right;20 reps;Supine (RROM L) Straight Leg Raises: AAROM;Right;15 reps;Supine (LLE strengthening 15x) Other Exercises Other Exercises: educated on elevation for swelling/pain control    General Comments        Pertinent Vitals/Pain Pain Assessment: 0-10 Pain Score: 8  Pain Location: R foot/ankle  Pain Intervention(s): Limited activity within patient's tolerance    Home Living                      Prior Function            PT Goals (current goals can now be found in the care plan section)      Frequency  7X/week    PT Plan Current plan remains appropriate    Co-evaluation             End of Session   Activity Tolerance: Patient tolerated treatment well Patient left: with bed alarm set;in bed;with call bell/phone within reach;Other (comment) (jRLE elevated)     Time: [id number] PT Time Calculation (min) (ACUTE ONLY): 20 min  Charges:  $Therapeutic Exercise: 8-22 mins                    G Codes:  Charlaine Dalton, [place] 08/06/2015, 3:53 PM

## 2015-08-06 NOTE — Progress Notes (Signed)
   08/06/15 1015  Clinical Encounter Type  Visited With Patient  Visit Type Initial  Consult/Referral To Chaplain  Spiritual Encounters  Spiritual Needs Prayer  Stress Factors  Patient Stress Factors Health changes  Visited with patient during routine rounds.  Patient was in good spirits.

## 2015-08-07 DIAGNOSIS — M79671 Pain in right foot: Secondary | ICD-10-CM | POA: Diagnosis not present

## 2015-08-07 DIAGNOSIS — M6281 Muscle weakness (generalized): Secondary | ICD-10-CM | POA: Diagnosis not present

## 2015-08-07 DIAGNOSIS — R262 Difficulty in walking, not elsewhere classified: Secondary | ICD-10-CM | POA: Diagnosis not present

## 2015-08-07 DIAGNOSIS — Z7401 Bed confinement status: Secondary | ICD-10-CM | POA: Diagnosis not present

## 2015-08-07 DIAGNOSIS — Z741 Need for assistance with personal care: Secondary | ICD-10-CM | POA: Diagnosis not present

## 2015-08-07 DIAGNOSIS — Q6689 Other  specified congenital deformities of feet: Secondary | ICD-10-CM | POA: Diagnosis not present

## 2015-08-07 DIAGNOSIS — M25571 Pain in right ankle and joints of right foot: Secondary | ICD-10-CM | POA: Diagnosis not present

## 2015-08-07 DIAGNOSIS — N183 Chronic kidney disease, stage 3 (moderate): Secondary | ICD-10-CM | POA: Diagnosis not present

## 2015-08-07 DIAGNOSIS — I129 Hypertensive chronic kidney disease with stage 1 through stage 4 chronic kidney disease, or unspecified chronic kidney disease: Secondary | ICD-10-CM | POA: Diagnosis not present

## 2015-08-07 DIAGNOSIS — M069 Rheumatoid arthritis, unspecified: Secondary | ICD-10-CM | POA: Diagnosis not present

## 2015-08-07 DIAGNOSIS — M7989 Other specified soft tissue disorders: Secondary | ICD-10-CM | POA: Diagnosis not present

## 2015-08-07 DIAGNOSIS — M19071 Primary osteoarthritis, right ankle and foot: Secondary | ICD-10-CM | POA: Diagnosis not present

## 2015-08-07 DIAGNOSIS — R6889 Other general symptoms and signs: Secondary | ICD-10-CM | POA: Diagnosis not present

## 2015-08-07 DIAGNOSIS — E039 Hypothyroidism, unspecified: Secondary | ICD-10-CM | POA: Diagnosis not present

## 2015-08-07 DIAGNOSIS — K219 Gastro-esophageal reflux disease without esophagitis: Secondary | ICD-10-CM | POA: Diagnosis not present

## 2015-08-07 DIAGNOSIS — D649 Anemia, unspecified: Secondary | ICD-10-CM | POA: Diagnosis not present

## 2015-08-07 LAB — SURGICAL PATHOLOGY

## 2015-08-07 MED ORDER — HYDROCODONE-ACETAMINOPHEN 5-325 MG PO TABS: 1.0000 | ORAL_TABLET | Freq: Four times a day (QID) | ORAL | Status: DC | PRN

## 2015-08-07 MED ORDER — POLYETHYLENE GLYCOL 3350 17 G PO PACK: 17.0000 g | PACK | Freq: Every day | ORAL | Status: DC

## 2015-08-07 MED ORDER — ENOXAPARIN SODIUM 40 MG/0.4ML ~~LOC~~ SOLN: 40.0000 mg | SUBCUTANEOUS | Status: DC

## 2015-08-07 NOTE — Progress Notes (Signed)
EMS here to transport pt. 

## 2015-08-07 NOTE — Clinical Social Work Note (Signed)
Pt is ready for discharge today and will go to [facility], pt's preferred facility. Pt is aware and in agreement with discharge plan. Facility is ready to admit pt as they have received discharge information. RN will call report and EMS will provide transportation. CSW is signing off as no further needs identified.   Michele Moore, MSW, LCSW  Clinical Social Worker [phone]

## 2015-08-07 NOTE — Discharge Instructions (Signed)
[place] [address] [place]   1. Take your medication as prescribed.  Pain medication should be taken only as needed.  2. Keep the dressing clean, dry and intact.  3. Keep your foot elevated above the heart level for the first 48 hours.  4. Walking to the bathroom and brief periods of walking are acceptable, unless we have instructed you to be non-weight bearing.  5. Always use your crutches if you are to be non-weight bearing.  6. Do not take a shower. Baths are permissible as long as the foot is kept out of the water.   7. Every hour you are awake:  - Bend your knee 15 times. - Massage calf 15 times  8. Call [facility] [phone]) if any of the following problems occur: - You develop a temperature or fever. - The bandage becomes saturated with blood. - Medication does not stop your pain. - Injury of the foot occurs. - Any symptoms of infection including redness, odor, or red streaks running from wound.   9.  Pt to use bone stimulator to foot 3 hours per day.

## 2015-08-07 NOTE — Final Progress Note (Signed)
Physician Final Progress Note  Patient ID: Michele Moore MRN: [id number] DOB/AGE: 07-28-1950 65 y.o.  Admit date: 08/03/2015 Admitting provider: [name], DPM Discharge date: 08/07/2015   Admission Diagnoses: Pain, CRF, obesity, s/p right midfoot reconstruction  Discharge Diagnoses:  Active Problems:   Pain, lower extremity  CRF  Consults: internal medicine  Significant Findings/ Diagnostic Studies: labs:  CBC Latest Ref Rng 08/03/2015 07/30/2015 12/27/2013  WBC 3.6 - 11.0 K/uL 6.0 4.7 6.8  Hemoglobin 12.0 - 16.0 g/dL 10.6(L) 10.3(L) 9.5(L)  Hematocrit 35.0 - 47.0 % 32.4(L) 31.6(L) 29.5(L)  Platelets 150 - 440 K/uL 150 166 214    Lab Results  Component Value Date   CREATININE 1.71* 08/05/2015   CREATININE 1.71* 08/04/2015   CREATININE 1.76* 08/03/2015     Procedures: Right midfoot fusion with bone stimulator and bone grafting  Discharge Condition: good  Disposition: SNF  Diet: Regular diet  Discharge Activity: NWB to operative foot x 6 weeks.      Discharge Instructions    Diet general    Complete by:  As directed      Leave dressing on - Keep it clean, dry, and intact until clinic visit    Complete by:  As directed      Walk with assistance    Complete by:  As directed   Pt NWB to operative foot.            Medication List    TAKE these medications        aspirin 81 MG tablet  Take 81 mg by mouth daily.     enoxaparin 40 MG/0.4ML injection  Commonly known as:  LOVENOX  Inject 0.4 mLs (40 mg total) into the skin daily.  Start taking on:  08/17/2015     gabapentin 300 MG capsule  Commonly known as:  NEURONTIN  Take 600 mg by mouth at bedtime.     hydrALAZINE 50 MG tablet  Commonly known as:  APRESOLINE  Take 50 mg by mouth 2 (two) times daily.     HYDROcodone-acetaminophen 5-325 MG tablet  Commonly known as:  NORCO/VICODIN  Take 1-2 tablets by mouth every 6 (six) hours as needed for moderate pain.     meloxicam 15 MG tablet  Commonly known  as:  MOBIC  Take 1 tablet (15 mg total) by mouth daily.     omega-3 acid ethyl esters 1 g capsule  Commonly known as:  LOVAZA  Take 2 g by mouth 2 (two) times daily.     omeprazole 40 MG capsule  Commonly known as:  PRILOSEC  Take 40 mg by mouth daily.     polyethylene glycol packet  Commonly known as:  MIRALAX / GLYCOLAX  Take 17 g by mouth daily.     potassium chloride SA 20 MEQ tablet  Commonly known as:  K-DUR,KLOR-CON  Take 20-40 mEq by mouth daily.     ranitidine 150 MG tablet  Commonly known as:  ZANTAC  Take 150 mg by mouth 2 (two) times daily.     SYNTHROID 150 MCG tablet  Generic drug:  levothyroxine  Take 150 mcg by mouth daily before breakfast.     SYSTANE OP  Apply to eye.     tiZANidine 4 MG capsule  Commonly known as:  ZANAFLEX  Take 4 mg by mouth at bedtime as needed.     triamterene-hydrochlorothiazide 75-50 MG tablet  Commonly known as:  MAXZIDE  Take 1 tablet by mouth daily.  TYLENOL PO  Take 500 mg by mouth as needed.         Total time spent taking care of this patient: 20 minutes  Pt is stable and pain seems better controlled.D/C to SNF  Signed: [name] A 08/07/2015, 8:29 AM

## 2015-08-07 NOTE — Clinical Social Work Placement (Signed)
   CLINICAL SOCIAL WORK PLACEMENT  NOTE  Date:  08/07/2015  Patient Details  Name: Michele Moore MRN: [id number] Date of Birth: [birth year]  Clinical Social Work is seeking post-discharge placement for this patient at the [place] level of care (*CSW will initial, date and re-position this form in  chart as items are completed):  Yes   Patient/family provided with [place] Work Department's list of facilities offering this level of care within the geographic area requested by the patient (or if unable, by the patient's family).  Yes   Patient/family informed of their freedom to choose among providers that offer the needed level of care, that participate in Medicare, Medicaid or managed care program needed by the patient, have an available bed and are willing to accept the patient.  Yes   Patient/family informed of 's ownership interest in [facility] and [facility], as well as of the fact that they are under no obligation to receive care at these facilities.  PASRR submitted to EDS on       PASRR number received on       Existing PASRR number confirmed on 08/05/15     FL2 transmitted to all facilities in geographic area requested by pt/family on 08/05/15     FL2 transmitted to all facilities within larger geographic area on       Patient informed that his/her managed care company has contracts with or will negotiate with certain facilities, including the following:        Yes   Patient/family informed of bed offers received.  Patient chooses bed at [facility]     Physician recommends and patient chooses bed at  [facility])    Patient to be transferred to [place] on 08/07/15.  Patient to be transferred to facility by [facility]     Patient family notified on 08/07/15 of transfer.  Name of family member notified:  Pt will call family     PHYSICIAN       Additional Comment:     _______________________________________________ Darden Dates, LCSW 08/07/2015, 9:56 AM

## 2015-08-07 NOTE — Progress Notes (Signed)
DISCHARGE NOTE:  Report called to [place] at [facility]. EMS called for transportation.

## 2015-08-07 NOTE — Progress Notes (Signed)
PT Cancellation Note  Patient Details Name: Michele Moore MRN: [id number] DOB: 10-14-1950   Cancelled Treatment:    Reason Eval/Treat Not Completed: Patient declined, no reason specified   Pt offered PT session but declined as she is awaiting D/C to SNF.   Chesley Noon 08/07/2015, 11:33 AM

## 2015-08-09 DIAGNOSIS — M79671 Pain in right foot: Secondary | ICD-10-CM | POA: Diagnosis not present

## 2015-08-09 DIAGNOSIS — M7989 Other specified soft tissue disorders: Secondary | ICD-10-CM | POA: Diagnosis not present

## 2015-08-14 DIAGNOSIS — K219 Gastro-esophageal reflux disease without esophagitis: Secondary | ICD-10-CM | POA: Diagnosis not present

## 2015-08-14 DIAGNOSIS — M79671 Pain in right foot: Secondary | ICD-10-CM | POA: Diagnosis not present

## 2015-08-14 DIAGNOSIS — M069 Rheumatoid arthritis, unspecified: Secondary | ICD-10-CM | POA: Diagnosis not present

## 2015-08-14 DIAGNOSIS — D649 Anemia, unspecified: Secondary | ICD-10-CM | POA: Diagnosis not present

## 2015-08-14 DIAGNOSIS — I129 Hypertensive chronic kidney disease with stage 1 through stage 4 chronic kidney disease, or unspecified chronic kidney disease: Secondary | ICD-10-CM | POA: Diagnosis not present

## 2015-08-14 DIAGNOSIS — E039 Hypothyroidism, unspecified: Secondary | ICD-10-CM | POA: Diagnosis not present

## 2015-08-14 DIAGNOSIS — N183 Chronic kidney disease, stage 3 (moderate): Secondary | ICD-10-CM | POA: Diagnosis not present

## 2015-08-15 DIAGNOSIS — M19071 Primary osteoarthritis, right ankle and foot: Secondary | ICD-10-CM | POA: Diagnosis not present

## 2015-08-15 DIAGNOSIS — M25571 Pain in right ankle and joints of right foot: Secondary | ICD-10-CM | POA: Diagnosis not present

## 2015-08-15 DIAGNOSIS — M7989 Other specified soft tissue disorders: Secondary | ICD-10-CM | POA: Diagnosis not present

## 2015-08-29 DIAGNOSIS — Q6689 Other  specified congenital deformities of feet: Secondary | ICD-10-CM | POA: Diagnosis not present

## 2015-08-29 DIAGNOSIS — M19071 Primary osteoarthritis, right ankle and foot: Secondary | ICD-10-CM | POA: Diagnosis not present

## 2015-08-29 DIAGNOSIS — M79671 Pain in right foot: Secondary | ICD-10-CM | POA: Diagnosis not present

## 2015-09-07 DIAGNOSIS — N183 Chronic kidney disease, stage 3 (moderate): Secondary | ICD-10-CM | POA: Diagnosis not present

## 2015-09-07 DIAGNOSIS — M069 Rheumatoid arthritis, unspecified: Secondary | ICD-10-CM | POA: Diagnosis not present

## 2015-09-07 DIAGNOSIS — Z7982 Long term (current) use of aspirin: Secondary | ICD-10-CM | POA: Diagnosis not present

## 2015-09-07 DIAGNOSIS — Z7901 Long term (current) use of anticoagulants: Secondary | ICD-10-CM | POA: Diagnosis not present

## 2015-09-07 DIAGNOSIS — Z96653 Presence of artificial knee joint, bilateral: Secondary | ICD-10-CM | POA: Diagnosis not present

## 2015-09-07 DIAGNOSIS — Z981 Arthrodesis status: Secondary | ICD-10-CM | POA: Diagnosis not present

## 2015-09-07 DIAGNOSIS — M19071 Primary osteoarthritis, right ankle and foot: Secondary | ICD-10-CM | POA: Diagnosis not present

## 2015-09-07 DIAGNOSIS — Z4789 Encounter for other orthopedic aftercare: Secondary | ICD-10-CM | POA: Diagnosis not present

## 2015-09-07 DIAGNOSIS — I129 Hypertensive chronic kidney disease with stage 1 through stage 4 chronic kidney disease, or unspecified chronic kidney disease: Secondary | ICD-10-CM | POA: Diagnosis not present

## 2015-09-11 DIAGNOSIS — M19071 Primary osteoarthritis, right ankle and foot: Secondary | ICD-10-CM | POA: Diagnosis not present

## 2015-09-11 DIAGNOSIS — N183 Chronic kidney disease, stage 3 (moderate): Secondary | ICD-10-CM | POA: Diagnosis not present

## 2015-09-11 DIAGNOSIS — Z4789 Encounter for other orthopedic aftercare: Secondary | ICD-10-CM | POA: Diagnosis not present

## 2015-09-11 DIAGNOSIS — I129 Hypertensive chronic kidney disease with stage 1 through stage 4 chronic kidney disease, or unspecified chronic kidney disease: Secondary | ICD-10-CM | POA: Diagnosis not present

## 2015-09-11 DIAGNOSIS — Z981 Arthrodesis status: Secondary | ICD-10-CM | POA: Diagnosis not present

## 2015-09-11 DIAGNOSIS — M069 Rheumatoid arthritis, unspecified: Secondary | ICD-10-CM | POA: Diagnosis not present

## 2015-09-13 DIAGNOSIS — Z4789 Encounter for other orthopedic aftercare: Secondary | ICD-10-CM | POA: Diagnosis not present

## 2015-09-13 DIAGNOSIS — Z981 Arthrodesis status: Secondary | ICD-10-CM | POA: Diagnosis not present

## 2015-09-13 DIAGNOSIS — I129 Hypertensive chronic kidney disease with stage 1 through stage 4 chronic kidney disease, or unspecified chronic kidney disease: Secondary | ICD-10-CM | POA: Diagnosis not present

## 2015-09-13 DIAGNOSIS — N183 Chronic kidney disease, stage 3 (moderate): Secondary | ICD-10-CM | POA: Diagnosis not present

## 2015-09-13 DIAGNOSIS — M19071 Primary osteoarthritis, right ankle and foot: Secondary | ICD-10-CM | POA: Diagnosis not present

## 2015-09-13 DIAGNOSIS — M069 Rheumatoid arthritis, unspecified: Secondary | ICD-10-CM | POA: Diagnosis not present

## 2015-09-14 DIAGNOSIS — Z981 Arthrodesis status: Secondary | ICD-10-CM | POA: Diagnosis not present

## 2015-09-14 DIAGNOSIS — N183 Chronic kidney disease, stage 3 (moderate): Secondary | ICD-10-CM | POA: Diagnosis not present

## 2015-09-14 DIAGNOSIS — Z4789 Encounter for other orthopedic aftercare: Secondary | ICD-10-CM | POA: Diagnosis not present

## 2015-09-14 DIAGNOSIS — I129 Hypertensive chronic kidney disease with stage 1 through stage 4 chronic kidney disease, or unspecified chronic kidney disease: Secondary | ICD-10-CM | POA: Diagnosis not present

## 2015-09-14 DIAGNOSIS — M19071 Primary osteoarthritis, right ankle and foot: Secondary | ICD-10-CM | POA: Diagnosis not present

## 2015-09-14 DIAGNOSIS — M069 Rheumatoid arthritis, unspecified: Secondary | ICD-10-CM | POA: Diagnosis not present

## 2015-09-18 DIAGNOSIS — M069 Rheumatoid arthritis, unspecified: Secondary | ICD-10-CM | POA: Diagnosis not present

## 2015-09-18 DIAGNOSIS — N183 Chronic kidney disease, stage 3 (moderate): Secondary | ICD-10-CM | POA: Diagnosis not present

## 2015-09-18 DIAGNOSIS — I129 Hypertensive chronic kidney disease with stage 1 through stage 4 chronic kidney disease, or unspecified chronic kidney disease: Secondary | ICD-10-CM | POA: Diagnosis not present

## 2015-09-18 DIAGNOSIS — M19071 Primary osteoarthritis, right ankle and foot: Secondary | ICD-10-CM | POA: Diagnosis not present

## 2015-09-18 DIAGNOSIS — Z4789 Encounter for other orthopedic aftercare: Secondary | ICD-10-CM | POA: Diagnosis not present

## 2015-09-18 DIAGNOSIS — Z981 Arthrodesis status: Secondary | ICD-10-CM | POA: Diagnosis not present

## 2015-09-19 DIAGNOSIS — M19071 Primary osteoarthritis, right ankle and foot: Secondary | ICD-10-CM | POA: Diagnosis not present

## 2015-09-19 DIAGNOSIS — Z4789 Encounter for other orthopedic aftercare: Secondary | ICD-10-CM | POA: Diagnosis not present

## 2015-09-19 DIAGNOSIS — M069 Rheumatoid arthritis, unspecified: Secondary | ICD-10-CM | POA: Diagnosis not present

## 2015-09-19 DIAGNOSIS — N183 Chronic kidney disease, stage 3 (moderate): Secondary | ICD-10-CM | POA: Diagnosis not present

## 2015-09-19 DIAGNOSIS — Z981 Arthrodesis status: Secondary | ICD-10-CM | POA: Diagnosis not present

## 2015-09-19 DIAGNOSIS — I129 Hypertensive chronic kidney disease with stage 1 through stage 4 chronic kidney disease, or unspecified chronic kidney disease: Secondary | ICD-10-CM | POA: Diagnosis not present

## 2015-09-20 DIAGNOSIS — I129 Hypertensive chronic kidney disease with stage 1 through stage 4 chronic kidney disease, or unspecified chronic kidney disease: Secondary | ICD-10-CM | POA: Diagnosis not present

## 2015-09-20 DIAGNOSIS — M069 Rheumatoid arthritis, unspecified: Secondary | ICD-10-CM | POA: Diagnosis not present

## 2015-09-20 DIAGNOSIS — N183 Chronic kidney disease, stage 3 (moderate): Secondary | ICD-10-CM | POA: Diagnosis not present

## 2015-09-20 DIAGNOSIS — Z981 Arthrodesis status: Secondary | ICD-10-CM | POA: Diagnosis not present

## 2015-09-20 DIAGNOSIS — M19071 Primary osteoarthritis, right ankle and foot: Secondary | ICD-10-CM | POA: Diagnosis not present

## 2015-09-20 DIAGNOSIS — Z4789 Encounter for other orthopedic aftercare: Secondary | ICD-10-CM | POA: Diagnosis not present

## 2015-09-24 DIAGNOSIS — M069 Rheumatoid arthritis, unspecified: Secondary | ICD-10-CM | POA: Diagnosis not present

## 2015-09-24 DIAGNOSIS — Z4789 Encounter for other orthopedic aftercare: Secondary | ICD-10-CM | POA: Diagnosis not present

## 2015-09-24 DIAGNOSIS — N183 Chronic kidney disease, stage 3 (moderate): Secondary | ICD-10-CM | POA: Diagnosis not present

## 2015-09-24 DIAGNOSIS — M19071 Primary osteoarthritis, right ankle and foot: Secondary | ICD-10-CM | POA: Diagnosis not present

## 2015-09-24 DIAGNOSIS — I129 Hypertensive chronic kidney disease with stage 1 through stage 4 chronic kidney disease, or unspecified chronic kidney disease: Secondary | ICD-10-CM | POA: Diagnosis not present

## 2015-09-24 DIAGNOSIS — Z981 Arthrodesis status: Secondary | ICD-10-CM | POA: Diagnosis not present

## 2015-09-26 DIAGNOSIS — M7989 Other specified soft tissue disorders: Secondary | ICD-10-CM | POA: Diagnosis not present

## 2015-09-26 DIAGNOSIS — M79671 Pain in right foot: Secondary | ICD-10-CM | POA: Diagnosis not present

## 2015-09-27 DIAGNOSIS — I129 Hypertensive chronic kidney disease with stage 1 through stage 4 chronic kidney disease, or unspecified chronic kidney disease: Secondary | ICD-10-CM | POA: Diagnosis not present

## 2015-09-27 DIAGNOSIS — Z4789 Encounter for other orthopedic aftercare: Secondary | ICD-10-CM | POA: Diagnosis not present

## 2015-09-27 DIAGNOSIS — N183 Chronic kidney disease, stage 3 (moderate): Secondary | ICD-10-CM | POA: Diagnosis not present

## 2015-09-27 DIAGNOSIS — Z981 Arthrodesis status: Secondary | ICD-10-CM | POA: Diagnosis not present

## 2015-09-27 DIAGNOSIS — M069 Rheumatoid arthritis, unspecified: Secondary | ICD-10-CM | POA: Diagnosis not present

## 2015-09-27 DIAGNOSIS — M19071 Primary osteoarthritis, right ankle and foot: Secondary | ICD-10-CM | POA: Diagnosis not present

## 2015-09-28 DIAGNOSIS — N183 Chronic kidney disease, stage 3 (moderate): Secondary | ICD-10-CM | POA: Diagnosis not present

## 2015-09-28 DIAGNOSIS — M069 Rheumatoid arthritis, unspecified: Secondary | ICD-10-CM | POA: Diagnosis not present

## 2015-09-28 DIAGNOSIS — Z4789 Encounter for other orthopedic aftercare: Secondary | ICD-10-CM | POA: Diagnosis not present

## 2015-09-28 DIAGNOSIS — M19071 Primary osteoarthritis, right ankle and foot: Secondary | ICD-10-CM | POA: Diagnosis not present

## 2015-09-28 DIAGNOSIS — Z981 Arthrodesis status: Secondary | ICD-10-CM | POA: Diagnosis not present

## 2015-09-28 DIAGNOSIS — I129 Hypertensive chronic kidney disease with stage 1 through stage 4 chronic kidney disease, or unspecified chronic kidney disease: Secondary | ICD-10-CM | POA: Diagnosis not present

## 2015-10-02 DIAGNOSIS — M069 Rheumatoid arthritis, unspecified: Secondary | ICD-10-CM | POA: Diagnosis not present

## 2015-10-02 DIAGNOSIS — N183 Chronic kidney disease, stage 3 (moderate): Secondary | ICD-10-CM | POA: Diagnosis not present

## 2015-10-02 DIAGNOSIS — I129 Hypertensive chronic kidney disease with stage 1 through stage 4 chronic kidney disease, or unspecified chronic kidney disease: Secondary | ICD-10-CM | POA: Diagnosis not present

## 2015-10-02 DIAGNOSIS — M19071 Primary osteoarthritis, right ankle and foot: Secondary | ICD-10-CM | POA: Diagnosis not present

## 2015-10-02 DIAGNOSIS — Z4789 Encounter for other orthopedic aftercare: Secondary | ICD-10-CM | POA: Diagnosis not present

## 2015-10-02 DIAGNOSIS — Z981 Arthrodesis status: Secondary | ICD-10-CM | POA: Diagnosis not present

## 2015-10-04 DIAGNOSIS — N183 Chronic kidney disease, stage 3 (moderate): Secondary | ICD-10-CM | POA: Diagnosis not present

## 2015-10-04 DIAGNOSIS — M19071 Primary osteoarthritis, right ankle and foot: Secondary | ICD-10-CM | POA: Diagnosis not present

## 2015-10-04 DIAGNOSIS — Z4789 Encounter for other orthopedic aftercare: Secondary | ICD-10-CM | POA: Diagnosis not present

## 2015-10-04 DIAGNOSIS — M069 Rheumatoid arthritis, unspecified: Secondary | ICD-10-CM | POA: Diagnosis not present

## 2015-10-04 DIAGNOSIS — I129 Hypertensive chronic kidney disease with stage 1 through stage 4 chronic kidney disease, or unspecified chronic kidney disease: Secondary | ICD-10-CM | POA: Diagnosis not present

## 2015-10-04 DIAGNOSIS — Z981 Arthrodesis status: Secondary | ICD-10-CM | POA: Diagnosis not present

## 2015-10-05 DIAGNOSIS — N183 Chronic kidney disease, stage 3 (moderate): Secondary | ICD-10-CM | POA: Diagnosis not present

## 2015-10-05 DIAGNOSIS — I129 Hypertensive chronic kidney disease with stage 1 through stage 4 chronic kidney disease, or unspecified chronic kidney disease: Secondary | ICD-10-CM | POA: Diagnosis not present

## 2015-10-05 DIAGNOSIS — Z4789 Encounter for other orthopedic aftercare: Secondary | ICD-10-CM | POA: Diagnosis not present

## 2015-10-05 DIAGNOSIS — M19071 Primary osteoarthritis, right ankle and foot: Secondary | ICD-10-CM | POA: Diagnosis not present

## 2015-10-05 DIAGNOSIS — Z981 Arthrodesis status: Secondary | ICD-10-CM | POA: Diagnosis not present

## 2015-10-05 DIAGNOSIS — M069 Rheumatoid arthritis, unspecified: Secondary | ICD-10-CM | POA: Diagnosis not present

## 2015-10-09 DIAGNOSIS — N183 Chronic kidney disease, stage 3 (moderate): Secondary | ICD-10-CM | POA: Diagnosis not present

## 2015-10-09 DIAGNOSIS — Z981 Arthrodesis status: Secondary | ICD-10-CM | POA: Diagnosis not present

## 2015-10-09 DIAGNOSIS — M19071 Primary osteoarthritis, right ankle and foot: Secondary | ICD-10-CM | POA: Diagnosis not present

## 2015-10-09 DIAGNOSIS — M069 Rheumatoid arthritis, unspecified: Secondary | ICD-10-CM | POA: Diagnosis not present

## 2015-10-09 DIAGNOSIS — I129 Hypertensive chronic kidney disease with stage 1 through stage 4 chronic kidney disease, or unspecified chronic kidney disease: Secondary | ICD-10-CM | POA: Diagnosis not present

## 2015-10-09 DIAGNOSIS — Z4789 Encounter for other orthopedic aftercare: Secondary | ICD-10-CM | POA: Diagnosis not present

## 2015-10-12 DIAGNOSIS — I129 Hypertensive chronic kidney disease with stage 1 through stage 4 chronic kidney disease, or unspecified chronic kidney disease: Secondary | ICD-10-CM | POA: Diagnosis not present

## 2015-10-12 DIAGNOSIS — Z981 Arthrodesis status: Secondary | ICD-10-CM | POA: Diagnosis not present

## 2015-10-12 DIAGNOSIS — M19071 Primary osteoarthritis, right ankle and foot: Secondary | ICD-10-CM | POA: Diagnosis not present

## 2015-10-12 DIAGNOSIS — Z4789 Encounter for other orthopedic aftercare: Secondary | ICD-10-CM | POA: Diagnosis not present

## 2015-10-12 DIAGNOSIS — N183 Chronic kidney disease, stage 3 (moderate): Secondary | ICD-10-CM | POA: Diagnosis not present

## 2015-10-12 DIAGNOSIS — M069 Rheumatoid arthritis, unspecified: Secondary | ICD-10-CM | POA: Diagnosis not present

## 2015-10-18 DIAGNOSIS — Z4789 Encounter for other orthopedic aftercare: Secondary | ICD-10-CM | POA: Diagnosis not present

## 2015-10-18 DIAGNOSIS — M19071 Primary osteoarthritis, right ankle and foot: Secondary | ICD-10-CM | POA: Diagnosis not present

## 2015-10-18 DIAGNOSIS — N183 Chronic kidney disease, stage 3 (moderate): Secondary | ICD-10-CM | POA: Diagnosis not present

## 2015-10-18 DIAGNOSIS — M069 Rheumatoid arthritis, unspecified: Secondary | ICD-10-CM | POA: Diagnosis not present

## 2015-10-18 DIAGNOSIS — I129 Hypertensive chronic kidney disease with stage 1 through stage 4 chronic kidney disease, or unspecified chronic kidney disease: Secondary | ICD-10-CM | POA: Diagnosis not present

## 2015-10-18 DIAGNOSIS — Z981 Arthrodesis status: Secondary | ICD-10-CM | POA: Diagnosis not present

## 2015-10-19 DIAGNOSIS — I129 Hypertensive chronic kidney disease with stage 1 through stage 4 chronic kidney disease, or unspecified chronic kidney disease: Secondary | ICD-10-CM | POA: Diagnosis not present

## 2015-10-19 DIAGNOSIS — M069 Rheumatoid arthritis, unspecified: Secondary | ICD-10-CM | POA: Diagnosis not present

## 2015-10-19 DIAGNOSIS — N183 Chronic kidney disease, stage 3 (moderate): Secondary | ICD-10-CM | POA: Diagnosis not present

## 2015-10-19 DIAGNOSIS — Z4789 Encounter for other orthopedic aftercare: Secondary | ICD-10-CM | POA: Diagnosis not present

## 2015-10-19 DIAGNOSIS — M19071 Primary osteoarthritis, right ankle and foot: Secondary | ICD-10-CM | POA: Diagnosis not present

## 2015-10-19 DIAGNOSIS — Z981 Arthrodesis status: Secondary | ICD-10-CM | POA: Diagnosis not present

## 2015-11-07 ENCOUNTER — Other Ambulatory Visit: Payer: Self-pay | Admitting: Podiatry

## 2015-11-07 DIAGNOSIS — M19071 Primary osteoarthritis, right ankle and foot: Secondary | ICD-10-CM

## 2015-11-07 DIAGNOSIS — M79671 Pain in right foot: Secondary | ICD-10-CM | POA: Diagnosis not present

## 2015-11-12 ENCOUNTER — Ambulatory Visit
Admission: RE | Admit: 2015-11-12 | Discharge: 2015-11-12 | Disposition: A | Discharge status: Home / Self Care | Payer: Medicare Other | Source: Ambulatory Visit | Attending: Podiatry | Admitting: Podiatry

## 2015-11-12 DIAGNOSIS — M19071 Primary osteoarthritis, right ankle and foot: Secondary | ICD-10-CM | POA: Insufficient documentation

## 2015-11-12 DIAGNOSIS — Z981 Arthrodesis status: Secondary | ICD-10-CM | POA: Diagnosis not present

## 2015-11-16 ENCOUNTER — Emergency Department: Payer: Medicare Other

## 2015-11-16 ENCOUNTER — Encounter: Payer: Self-pay | Admitting: Emergency Medicine

## 2015-11-16 ENCOUNTER — Emergency Department
Admission: EM | Admit: 2015-11-16 | Discharge: 2015-11-16 | Disposition: A | Discharge status: Home / Self Care | Payer: Medicare Other | Attending: Emergency Medicine | Admitting: Emergency Medicine

## 2015-11-16 DIAGNOSIS — Z79899 Other long term (current) drug therapy: Secondary | ICD-10-CM | POA: Insufficient documentation

## 2015-11-16 DIAGNOSIS — S3993XA Unspecified injury of pelvis, initial encounter: Secondary | ICD-10-CM | POA: Diagnosis not present

## 2015-11-16 DIAGNOSIS — S8991XA Unspecified injury of right lower leg, initial encounter: Secondary | ICD-10-CM | POA: Diagnosis present

## 2015-11-16 DIAGNOSIS — S8992XA Unspecified injury of left lower leg, initial encounter: Secondary | ICD-10-CM | POA: Diagnosis not present

## 2015-11-16 DIAGNOSIS — Z7982 Long term (current) use of aspirin: Secondary | ICD-10-CM | POA: Diagnosis not present

## 2015-11-16 DIAGNOSIS — M25551 Pain in right hip: Secondary | ICD-10-CM | POA: Insufficient documentation

## 2015-11-16 DIAGNOSIS — S99911A Unspecified injury of right ankle, initial encounter: Secondary | ICD-10-CM | POA: Diagnosis not present

## 2015-11-16 DIAGNOSIS — S99912A Unspecified injury of left ankle, initial encounter: Secondary | ICD-10-CM | POA: Diagnosis not present

## 2015-11-16 DIAGNOSIS — S79912A Unspecified injury of left hip, initial encounter: Secondary | ICD-10-CM | POA: Diagnosis not present

## 2015-11-16 DIAGNOSIS — N182 Chronic kidney disease, stage 2 (mild): Secondary | ICD-10-CM | POA: Diagnosis not present

## 2015-11-16 DIAGNOSIS — Y9301 Activity, walking, marching and hiking: Secondary | ICD-10-CM | POA: Diagnosis not present

## 2015-11-16 DIAGNOSIS — I129 Hypertensive chronic kidney disease with stage 1 through stage 4 chronic kidney disease, or unspecified chronic kidney disease: Secondary | ICD-10-CM | POA: Diagnosis not present

## 2015-11-16 DIAGNOSIS — R102 Pelvic and perineal pain: Secondary | ICD-10-CM | POA: Diagnosis not present

## 2015-11-16 DIAGNOSIS — Y999 Unspecified external cause status: Secondary | ICD-10-CM | POA: Insufficient documentation

## 2015-11-16 DIAGNOSIS — M25561 Pain in right knee: Secondary | ICD-10-CM | POA: Diagnosis not present

## 2015-11-16 DIAGNOSIS — Y9289 Other specified places as the place of occurrence of the external cause: Secondary | ICD-10-CM | POA: Insufficient documentation

## 2015-11-16 DIAGNOSIS — Z87891 Personal history of nicotine dependence: Secondary | ICD-10-CM | POA: Insufficient documentation

## 2015-11-16 DIAGNOSIS — M25572 Pain in left ankle and joints of left foot: Secondary | ICD-10-CM | POA: Diagnosis not present

## 2015-11-16 DIAGNOSIS — M25461 Effusion, right knee: Secondary | ICD-10-CM | POA: Diagnosis not present

## 2015-11-16 DIAGNOSIS — W19XXXA Unspecified fall, initial encounter: Secondary | ICD-10-CM

## 2015-11-16 DIAGNOSIS — M25552 Pain in left hip: Secondary | ICD-10-CM | POA: Diagnosis not present

## 2015-11-16 DIAGNOSIS — S72434A Nondisplaced fracture of medial condyle of right femur, initial encounter for closed fracture: Secondary | ICD-10-CM | POA: Diagnosis not present

## 2015-11-16 DIAGNOSIS — W1839XA Other fall on same level, initial encounter: Secondary | ICD-10-CM | POA: Diagnosis not present

## 2015-11-16 DIAGNOSIS — M25571 Pain in right ankle and joints of right foot: Secondary | ICD-10-CM | POA: Diagnosis not present

## 2015-11-16 DIAGNOSIS — S82891A Other fracture of right lower leg, initial encounter for closed fracture: Secondary | ICD-10-CM

## 2015-11-16 DIAGNOSIS — S3992XA Unspecified injury of lower back, initial encounter: Secondary | ICD-10-CM | POA: Diagnosis not present

## 2015-11-16 MED ORDER — HYDROMORPHONE HCL 1 MG/ML IJ SOLN
0.5000 mg | Freq: Once | INTRAMUSCULAR | Status: AC
Start: 2015-11-16 — End: 2015-11-16
  Administered 2015-11-16: 0.5 mg via INTRAVENOUS
  Filled 2015-11-16: qty 1

## 2015-11-16 MED ORDER — OXYCODONE-ACETAMINOPHEN 7.5-325 MG PO TABS: 1.0000 | ORAL_TABLET | ORAL | 0 refills | Status: AC | PRN

## 2015-11-16 MED ORDER — ONDANSETRON HCL 4 MG/2ML IJ SOLN
4.0000 mg | Freq: Once | INTRAMUSCULAR | Status: AC
  Administered 2015-11-16: 4 mg via INTRAVENOUS
  Filled 2015-11-16: qty 2

## 2015-11-16 NOTE — ED Notes (Signed)
Pt in via triage, reports falling today at home, "landing in a split" with her right leg "back behind" her.  Pt denies hitting her head.  Pt with hx of bilateral knee replacements, right ankle surgery.  Swelling noted to right knee with limited ROM to that extremity as well.  Pt A/Ox4, no immediate distress noted at this time.

## 2015-11-16 NOTE — ED Notes (Signed)
Patient transported to X-ray 

## 2015-11-16 NOTE — ED Triage Notes (Signed)
Pt slipped while walking on wheelchair ramp today; states her knee "twisted around behind me" and that she laid there for 30 minutes, unable to get up. Pt states she has had total knee replacement, as well as ankle surgery on the same extremity. Pt alert & oriented, unable to move the knee at all.

## 2015-11-16 NOTE — Discharge Instructions (Signed)
Wear knee immobilizer. Use the walker. Follow up with your orthopedic doctor or Dr. Rudene Christians. Call first thing Monday morning for an appointment later on that week. Use the Percocet 7.5 one pill 4 times a day if need be. Be careful to make you sleepy and constipated. Be careful you don't get woozy from it and fall. Do not Drive on the [place]. Return for increased pain in the leg numbness or color change in the leg.

## 2015-11-16 NOTE — ED Provider Notes (Signed)
[facility] Emergency Department Provider Note   ____________________________________________   First MD Initiated Contact with Patient 11/16/15 1521     (approximate)  I have reviewed the triage vital signs and the nursing notes.   HISTORY  Chief Complaint Knee Pain    HPI Michele Moore is a 65 y.o. female who who reports she fell all walking twisted her left leg behind her. She complains of pain in the low back left hip left ankle and especially in the left knee which is now swollen up. Patient has bilateral knee replacements and has had a screw infusion of her left ankle. The ankle was not hurting as badly as the knee. Patient reports she cannot lift her left leg up since the fall. Fall was earlier today. The pain is severe. Achy. Movement makes it worse. She reports she did not hit her head and did not pass out. Patient has no headache neck pain or upper back pain.   Past Medical History:  Diagnosis Date  . Anemia   . Arthritis   . Chronic kidney disease    STAGE 3 PER DR EASON 08/02/15  . Family history of adverse reaction to anesthesia    sister difficult to put to sleep  . GERD (gastroesophageal reflux disease)   . Hemorrhoid   . History of hiatal hernia   . Hypertension   . Migraines   . Mixed hyperlipidemia   . Multiple gastric ulcers   . Thyroid disease     Patient Active Problem List   Diagnosis Date Noted  . Pain, lower extremity 08/03/2015    Past Surgical History:  Procedure Laterality Date  . ANKLE SURGERY    . CARPAL TUNNEL RELEASE     x3  . COLONOSCOPY  2000?  . FUSION OF TALONAVICULAR JOINT Right 08/03/2015   Procedure: TAILOR NAVICULAR JOINT FUSION - RIGHT ;  Surgeon: [name], DPM;  Location: [facility] ORS;  Service: Podiatry;  Laterality: Right;  . JOINT REPLACEMENT     knee x 3 ,right x1 and left x2  . ROTATOR CUFF REPAIR    . TONSILLECTOMY    . TUBAL LIGATION    . UPPER GI ENDOSCOPY  2000?    Prior to Admission  medications   Medication Sig Start Date End Date Taking? Authorizing Provider  Acetaminophen (TYLENOL PO) Take 500 mg by mouth as needed.    Historical Provider, MD  aspirin 81 MG tablet Take 81 mg by mouth daily.    Historical Provider, MD  enoxaparin (LOVENOX) 40 MG/0.4ML injection Inject 0.4 mLs (40 mg total) into the skin daily. 08/17/15   [name], DPM  gabapentin (NEURONTIN) 300 MG capsule Take 600 mg by mouth at bedtime.    Historical Provider, MD  hydrALAZINE (APRESOLINE) 50 MG tablet Take 50 mg by mouth 2 (two) times daily.    Historical Provider, MD  HYDROcodone-acetaminophen (NORCO/VICODIN) 5-325 MG tablet Take 1-2 tablets by mouth every 6 (six) hours as needed for moderate pain. 08/07/15   [name], DPM  meloxicam (MOBIC) 15 MG tablet Take 1 tablet (15 mg total) by mouth daily. 02/21/15   Charline Bills Cuthriell, PA-C  omega-3 acid ethyl esters (LOVAZA) 1 G capsule Take 2 g by mouth 2 (two) times daily.  03/09/14   Historical Provider, MD  omeprazole (PRILOSEC) 40 MG capsule Take 40 mg by mouth daily.  04/07/14   Historical Provider, MD  oxyCODONE-acetaminophen (PERCOCET) 7.5-325 MG tablet Take 1 tablet by mouth every  4 (four) hours as needed for severe pain. 11/16/15 11/15/16  Nena Polio, MD  Polyethyl Glycol-Propyl Glycol (SYSTANE OP) Apply to eye.    Historical Provider, MD  polyethylene glycol (MIRALAX / GLYCOLAX) packet Take 17 g by mouth daily. 08/07/15   [name], DPM  potassium chloride SA (K-DUR,KLOR-CON) 20 MEQ tablet Take 20-40 mEq by mouth daily.  02/14/14   Historical Provider, MD  ranitidine (ZANTAC) 150 MG tablet Take 150 mg by mouth 2 (two) times daily.  03/29/14   Historical Provider, MD  SYNTHROID 150 MCG tablet Take 150 mcg by mouth daily before breakfast.  03/28/14   Historical Provider, MD  tiZANidine (ZANAFLEX) 4 MG capsule Take 4 mg by mouth at bedtime as needed.     Historical Provider, MD  triamterene-hydrochlorothiazide (MAXZIDE) 75-50 MG per tablet Take 1  tablet by mouth daily.  03/22/14   Historical Provider, MD    Allergies Penicillins and Vibramycin [doxycycline calcium]  Family History  Problem Relation Age of Onset  . COPD Sister   . Cancer Maternal Aunt     breast  . Cancer Maternal Uncle     kidney    Social History Social History  Substance Use Topics  . Smoking status: Former Smoker    Quit date: 02/24/1993  . Smokeless tobacco: Never Used  . Alcohol use No    Review of Systems Constitutional: No fever/chills Eyes: No visual changes. ENT: No sore throat. Cardiovascular: Denies chest pain. Respiratory: Denies shortness of breath. Gastrointestinal: No abdominal pain.  No nausea, no vomiting.  No diarrhea.  No constipation. Genitourinary: Negative for dysuria. Musculoskeletal:  back pain. Skin: Negative for rash. Neurological: Negative for headaches, focal weakness or numbness.  10-point ROS otherwise negative.  ____________________________________________   PHYSICAL EXAM:  VITAL SIGNS: ED Triage Vitals  Enc Vitals Group     BP 11/16/15 1451 (!) 120/56     Pulse Rate 11/16/15 1451 68     Resp --      Temp 11/16/15 1451 98.8 F (37.1 C)     Temp Source 11/16/15 1451 Oral     SpO2 11/16/15 1451 96 %     Weight 11/16/15 1452 228 lb (103.4 kg)     Height 11/16/15 1452 6' (1.829 m)     Head Circumference --      Peak Flow --      Pain Score 11/16/15 1453 10     Pain Loc --      Pain Edu? --      Excl. in [place]? --     Constitutional: Alert and oriented. Well appearing and in no acute distress. Eyes: Conjunctivae are normal. PERRL. EOMI. Head: Atraumatic. Nose: No congestion/rhinnorhea. Mouth/Throat: Mucous membranes are moist.  Oropharynx non-erythematous. Neck: No stridor. No pain with movement or palpation Cardiovascular: Normal rate, regular rhythm. Grossly normal heart sounds.  Good peripheral circulation. Respiratory: Normal respiratory effort.  No retractions. Lungs CTAB. Gastrointestinal: Soft  and nontender. No distention. No abdominal bruits. No CVA tenderness. Musculoskeletal: Patient has pain in the ankle knee and hip and the lumbar area of the spine. There is no pain elsewhere. The knee is very swollen. This is new. There appears to be some effusion there.  No joint effusions elsewhere. Neurologic:  Normal speech and language. No gross focal neurologic deficits are appreciated. No gait instability. Skin:  Skin is warm, dry and intact. No rash noted. Psychiatric: Mood and affect are normal. Speech and behavior are normal.  ____________________________________________  LABS (all labs ordered are listed, but only abnormal results are displayed)  Labs Reviewed - No data to display ____________________________________________  EKG   ____________________________________________  RADIOLOGY COMPARISON:  None.  FINDINGS: The femoral and tibial components of a total knee arthroplasty appear well seated without complicating features. Could not exclude subtle nondisplaced fracture involving the medial femoral condyle. There is a large joint effusion and possible lipohemarthrosis.  IMPRESSION: Large joint effusion and possible lipohemarthrosis. Could not exclude a subtle fracture involving the medial femoral condyle. CT may be helpful for further evaluation.   Electronically Signed   By: Marijo Sanes M.D.   On: 11/16/2015 17:15 EXAM: LUMBAR SPINE - COMPLETE 4+ VIEW  COMPARISON:  02/21/2015 .  FINDINGS: Diffuse degenerative changes lumbar spine. Normal alignment. No acute fracture. Minimal compression of L5 is stable .  IMPRESSION: Diffuse degenerative change. Minimal compression of L5. This is stable. No acute abnormality identified.   Electronically Signed   By: Marcello Moores  Register   On: 11/16/2015 17:08 ____________________  LEFT KNEE - COMPLETE 4+ VIEW  COMPARISON:  None.  FINDINGS: Subtle left knee arthroplasty components are well seated.  No complicating features. No periprosthetic fracture. Areas of heterotopic ossification are noted. No definite joint effusion.  IMPRESSION: No acute bony findings.   Electronically Signed   By: Marijo Sanes M.D.   On: 11/16/2015 17:10  CLINICAL DATA:  [place] today.  Bilateral lower extremity pain.  EXAM: DG HIP (WITH OR WITHOUT PELVIS) 2-3V LEFT  COMPARISON:  Radiographs 02/21/2015  FINDINGS: Both hips are normally located. Mild stable joint space narrowing and early spurring. No fracture or AVN. The pubic symphysis and SI joints are intact. No definite pelvic fractures or bone lesions.  IMPRESSION: No acute bony findings.   Electronically Signed   By: Marijo Sanes M.D.   On: 11/16/2015 17:08 EXAM: LEFT ANKLE COMPLETE - 3+ VIEW  COMPARISON:  None.  FINDINGS: The ankle mortise is maintained. No acute ankle fracture. No osteochondral lesion. No definite joint effusion. Mild pes planus. The mid and hindfoot bony structures are grossly intact. Small calcaneal heel spur.  IMPRESSION: No acute fracture.   Electronically Signed   By: Marijo Sanes M.D.   On: 11/16/2015 17:11 ________________EXAM: CT OF THE RIGHT KNEE WITHOUT CONTRAST  TECHNIQUE: Multidetector CT imaging of the right knee was performed according to the standard protocol. Multiplanar CT image reconstructions were also generated.  COMPARISON:  Plain films right knee earlier today.  FINDINGS: Bones/Joint/Cartilage  Right total knee arthroplasty is in place. The patient has an acute nondisplaced periprosthetic fracture along the peripheral margin of the medial femoral condyle. The fracture extends from the medial metaphysis 4 cm above the joint line to approximately the level of the joint line. No other fracture is identified. The patient's arthroplasty is located without loosening or other complicating feature.  Ligaments  Not applicable.  Muscles and  Tendons  Unremarkable.  Soft tissues  Large joint effusion is noted. 2 small linear radiopaque densities measuring approximately 1 cm in diameter is seen along the anterior margin of the distal femur on the lateral side and are likely calcifications rather than loose bony fragments.  IMPRESSION: Nondisplaced periprosthetic fracture of the right medial femoral condyle with associated large joint effusion.   Electronically Signed   By: Inge Rise M.D.   On: 11/16/2015 18:24 EXAM: CT PELVIS WITHOUT CONTRAST  TECHNIQUE: Multidetector CT imaging of the pelvis was performed following the standard protocol without intravenous contrast.  COMPARISON:  None.  FINDINGS: The hips are located. No fracture is identified. Degenerative disc disease and facet arthropathy are seen at L4-5 and L5-S1. There is no notable degenerative disease about the hips or SI joints. There is some degenerative change about the symphysis pubis. No lytic or sclerotic bony lesion is identified. No hip effusion or evidence of bursitis is seen. Imaged intrapelvic contents demonstrate atherosclerotic vascular disease.  IMPRESSION: No acute abnormality.  Atherosclerosis.  L4-5 and L5-S1 spondylosis.   Electronically Signed   By: Inge Rise M.D.   On: 11/16/2015 18:27________ CLINICAL DATA:  Initial encounter for Pt fell today and is having right ankle pain. Pt had right ankle surgery in June.  EXAM: RIGHT ANKLE - COMPLETE 3+ VIEW  COMPARISON:  CT and 11/12/2015  FINDINGS: Diffuse soft tissue swelling which is likely postoperative. Surgical changes of subtalar fusion. Midfoot fixation. Pes planus deformity. No acute hardware complication. Tibiotalar osteoarthritis. No acute fracture. Base of fifth metatarsal and talar dome intact.  IMPRESSION: Surgical changes, without acute osseous abnormality   Electronically Signed   By: Abigail Miyamoto M.D.   On: 11/16/2015  18:29   PROCEDURES  Procedure(s) performed  Procedures  Critical Care performed:  ____________________________________________   INITIAL IMPRESSION / ASSESSMENT AND PLAN / ED COURSE  Pertinent labs & imaging results that were available during my care of the patient were reviewed by me and considered in my medical decision making (see chart for details).  Patient went to x-ray and complained that the other knee was hurting so I x-rayed both knees. Patient later went to x-ray for a CT scan and reported that instead of the right hip hurting the left hip was hurting so I ended up CT and x-ray both hips and both knees and both ankles because the pain seemed to move between the time she was here time she was in x-ray. Discussed patient with Dr. Youlanda Mighty [facility]. [place]ousand knee immobilizer would be the indicated treatment for this patient. He can follow-up with the patient as an outpatient. Patient reports that Percocet fives are not working for her pain I will try some Percocet 7.5.  Clinical Course     ____________________________________________   FINAL CLINICAL IMPRESSION(S) / ED DIAGNOSES  Final diagnoses:  Knee fracture, right, closed, initial encounter      NEW MEDICATIONS STARTED DURING THIS VISIT:  New Prescriptions   OXYCODONE-ACETAMINOPHEN (PERCOCET) 7.5-325 MG TABLET    Take 1 tablet by mouth every 4 (four) hours as needed for severe pain.     Note:  This document was prepared using Dragon voice recognition software and may include unintentional dictation errors.    Nena Polio, MD 11/16/15 Einar Crow

## 2015-11-22 DIAGNOSIS — M9711XA Periprosthetic fracture around internal prosthetic right knee joint, initial encounter: Secondary | ICD-10-CM | POA: Diagnosis not present

## 2015-12-06 DIAGNOSIS — M25561 Pain in right knee: Secondary | ICD-10-CM | POA: Diagnosis not present

## 2015-12-06 DIAGNOSIS — M9711XA Periprosthetic fracture around internal prosthetic right knee joint, initial encounter: Secondary | ICD-10-CM | POA: Diagnosis not present

## 2015-12-20 DIAGNOSIS — M25561 Pain in right knee: Secondary | ICD-10-CM | POA: Diagnosis not present

## 2015-12-20 DIAGNOSIS — M9711XA Periprosthetic fracture around internal prosthetic right knee joint, initial encounter: Secondary | ICD-10-CM | POA: Diagnosis not present

## 2015-12-22 ENCOUNTER — Ambulatory Visit: Payer: Medicare Other

## 2015-12-22 ENCOUNTER — Ambulatory Visit
Admission: EM | Admit: 2015-12-22 | Discharge: 2015-12-22 | Disposition: A | Discharge status: Home / Self Care | Payer: Medicare Other | Attending: Family Medicine | Admitting: Family Medicine

## 2015-12-22 ENCOUNTER — Encounter: Payer: Self-pay | Admitting: Gynecology

## 2015-12-22 DIAGNOSIS — R05 Cough: Secondary | ICD-10-CM | POA: Diagnosis not present

## 2015-12-22 DIAGNOSIS — S0031XA Abrasion of nose, initial encounter: Secondary | ICD-10-CM | POA: Diagnosis not present

## 2015-12-22 DIAGNOSIS — I700 Atherosclerosis of aorta: Secondary | ICD-10-CM | POA: Diagnosis not present

## 2015-12-22 DIAGNOSIS — J018 Other acute sinusitis: Secondary | ICD-10-CM | POA: Diagnosis not present

## 2015-12-22 DIAGNOSIS — J208 Acute bronchitis due to other specified organisms: Secondary | ICD-10-CM | POA: Diagnosis not present

## 2015-12-22 DIAGNOSIS — L089 Local infection of the skin and subcutaneous tissue, unspecified: Secondary | ICD-10-CM

## 2015-12-22 DIAGNOSIS — J209 Acute bronchitis, unspecified: Secondary | ICD-10-CM | POA: Diagnosis not present

## 2015-12-22 DIAGNOSIS — R0989 Other specified symptoms and signs involving the circulatory and respiratory systems: Secondary | ICD-10-CM | POA: Diagnosis not present

## 2015-12-22 MED ORDER — PREDNISONE 10 MG (21) PO TBPK: ORAL_TABLET | ORAL | 0 refills | Status: DC

## 2015-12-22 MED ORDER — MUPIROCIN 2 % EX OINT: 1.0000 "application " | TOPICAL_OINTMENT | Freq: Three times a day (TID) | CUTANEOUS | 0 refills | Status: DC

## 2015-12-22 MED ORDER — CEFUROXIME AXETIL 500 MG PO TABS: 500.0000 mg | ORAL_TABLET | Freq: Two times a day (BID) | ORAL | 0 refills | Status: DC

## 2015-12-22 MED ORDER — IPRATROPIUM-ALBUTEROL 0.5-2.5 (3) MG/3ML IN SOLN
3.0000 mL | Freq: Once | RESPIRATORY_TRACT | Status: AC
  Administered 2015-12-22: 3 mL via RESPIRATORY_TRACT

## 2015-12-22 MED ORDER — HYDROCOD POLST-CPM POLST ER 10-8 MG/5ML PO SUER: 5.0000 mL | Freq: Two times a day (BID) | ORAL | 0 refills | Status: DC | PRN

## 2015-12-22 MED ORDER — FEXOFENADINE-PSEUDOEPHED ER 180-240 MG PO TB24: 1.0000 | ORAL_TABLET | Freq: Every day | ORAL | 0 refills | Status: DC

## 2015-12-22 NOTE — ED Triage Notes (Signed)
Patient c/o cough / nasal congestion / left ear pain x over 1 week.

## 2015-12-22 NOTE — ED Provider Notes (Signed)
[facility]    CSN: [id number] Arrival date & time: 12/22/15  1230     History   Chief Complaint Chief Complaint  Patient presents with  . Cough    HPI Michele Moore is a 65 y.o. female.   Patient is a 65 year old black female comes in with shortness of breath and a cough for 4 1/2-2 weeks. She states that she's had nasal congestion and coughing has progressively gotten worse. She has a productive cough now she feels short of breath she's had some wheezing at night. She states that she feels almost like she did last year when she had pneumonia. She states that she's also had popping of her left ear at times she owns a cough which is also distress and discomfort symptom breathing of nostril and along her right nostril she's got some swelling and irritation in the lump near the distal naris she reports wheezing at night and not gaining improvement despite using the Mucinex in ability to sleep. She is allergic to penicillins and Vibramycin she hasn't smoked for over 20 years. She states no one smokes around her. Sister with COPD aunt with breast cancer and a maternal uncle kidney cancer. She has history of arthritis chronic kidney disease GERD hypertension migraine mixed hyperlipidemia multiple gastric ulcers and thyroid disease.   The history is provided by the patient. No language interpreter was used.  Cough  Cough characteristics:  Productive Sputum characteristics:  Green and yellow Severity:  Moderate Duration:  10 days Timing:  Constant Progression:  Worsening Chronicity:  New Smoker: no   Relieved by:  Nothing Worsened by:  Activity Ineffective treatments:  Cough suppressants Associated symptoms: fever, shortness of breath, sinus congestion and wheezing   Associated symptoms comment:  R bnassal irrritation L eft ear was poopping   Past Medical History:  Diagnosis Date  . Anemia   . Arthritis   . Chronic kidney disease    STAGE 3 PER DR EASON 08/02/15  .  Family history of adverse reaction to anesthesia    sister difficult to put to sleep  . GERD (gastroesophageal reflux disease)   . Hemorrhoid   . History of hiatal hernia   . Hypertension   . Migraines   . Mixed hyperlipidemia   . Multiple gastric ulcers   . Thyroid disease     Patient Active Problem List   Diagnosis Date Noted  . Pain, lower extremity 08/03/2015    Past Surgical History:  Procedure Laterality Date  . ANKLE SURGERY    . CARPAL TUNNEL RELEASE     x3  . COLONOSCOPY  2000?  . FUSION OF TALONAVICULAR JOINT Right 08/03/2015   Procedure: TAILOR NAVICULAR JOINT FUSION - RIGHT ;  Surgeon: [name], DPM;  Location: [facility] ORS;  Service: Podiatry;  Laterality: Right;  . JOINT REPLACEMENT     knee x 3 ,right x1 and left x2  . ROTATOR CUFF REPAIR    . TONSILLECTOMY    . TUBAL LIGATION    . UPPER GI ENDOSCOPY  2000?    OB History    Gravida Para Term Preterm AB Living   4             SAB TAB Ectopic Multiple Live Births                  Obstetric Comments   1st Menstrual Cycle:  16 1st Pregnancy:  19       Home Medications  Prior to Admission medications   Medication Sig Start Date End Date Taking? Authorizing Provider  Acetaminophen (TYLENOL PO) Take 500 mg by mouth as needed.   Yes Historical Provider, MD  aspirin 81 MG tablet Take 81 mg by mouth daily.   Yes Historical Provider, MD  enoxaparin (LOVENOX) 40 MG/0.4ML injection Inject 0.4 mLs (40 mg total) into the skin daily. 08/17/15  Yes [name], DPM  gabapentin (NEURONTIN) 300 MG capsule Take 600 mg by mouth at bedtime.   Yes Historical Provider, MD  hydrALAZINE (APRESOLINE) 50 MG tablet Take 50 mg by mouth 2 (two) times daily.   Yes Historical Provider, MD  HYDROcodone-acetaminophen (NORCO/VICODIN) 5-325 MG tablet Take 1-2 tablets by mouth every 6 (six) hours as needed for moderate pain. 08/07/15  Yes [name], DPM  meloxicam (MOBIC) 15 MG tablet Take 1 tablet (15 mg total) by mouth daily.  02/21/15  Yes Charline Bills Cuthriell, PA-C  omega-3 acid ethyl esters (LOVAZA) 1 G capsule Take 2 g by mouth 2 (two) times daily.  03/09/14  Yes Historical Provider, MD  omeprazole (PRILOSEC) 40 MG capsule Take 40 mg by mouth daily.  04/07/14  Yes Historical Provider, MD  oxyCODONE-acetaminophen (PERCOCET) 7.5-325 MG tablet Take 1 tablet by mouth every 4 (four) hours as needed for severe pain. 11/16/15 11/15/16 Yes Nena Polio, MD  Polyethyl Glycol-Propyl Glycol (SYSTANE OP) Apply to eye.   Yes Historical Provider, MD  polyethylene glycol (MIRALAX / GLYCOLAX) packet Take 17 g by mouth daily. 08/07/15  Yes [name], DPM  potassium chloride SA (K-DUR,KLOR-CON) 20 MEQ tablet Take 20-40 mEq by mouth daily.  02/14/14  Yes Historical Provider, MD  ranitidine (ZANTAC) 150 MG tablet Take 150 mg by mouth 2 (two) times daily.  03/29/14  Yes Historical Provider, MD  SYNTHROID 150 MCG tablet Take 150 mcg by mouth daily before breakfast.  03/28/14  Yes Historical Provider, MD  tiZANidine (ZANAFLEX) 4 MG capsule Take 4 mg by mouth at bedtime as needed.    Yes Historical Provider, MD  triamterene-hydrochlorothiazide (MAXZIDE) 75-50 MG per tablet Take 1 tablet by mouth daily.  03/22/14  Yes Historical Provider, MD  cefUROXime (CEFTIN) 500 MG tablet Take 1 tablet (500 mg total) by mouth 2 (two) times daily. 12/22/15   Frederich Cha, MD  chlorpheniramine-HYDROcodone [facility] PENNKINETIC ER) 10-8 MG/5ML SUER Take 5 mLs by mouth every 12 (twelve) hours as needed for cough. 12/22/15   Frederich Cha, MD  fexofenadine-pseudoephedrine (ALLEGRA-D ALLERGY & CONGESTION) 180-240 MG 24 hr tablet Take 1 tablet by mouth daily. 12/22/15   Frederich Cha, MD  mupirocin ointment (BACTROBAN) 2 % Apply 1 application topically 3 (three) times daily. 12/22/15   Frederich Cha, MD  predniSONE (STERAPRED UNI-PAK 21 TAB) 10 MG (21) TBPK tablet Sig 6 tablet day 1, 5 tablets day 2, 4 tablets day 3,,3tablets day 4, 2 tablets day 5, 1 tablet day 6 take all  tablets orally 12/22/15   Frederich Cha, MD    Family History Family History  Problem Relation Age of Onset  . COPD Sister   . Cancer Maternal Aunt     breast  . Cancer Maternal Uncle     kidney    Social History Social History  Substance Use Topics  . Smoking status: Former Smoker    Quit date: 02/24/1993  . Smokeless tobacco: Never Used  . Alcohol use No     Allergies   Penicillins and Vibramycin [doxycycline calcium]   Review of Systems Review of  Systems  Constitutional: Positive for fever.  Respiratory: Positive for cough, shortness of breath and wheezing.   Musculoskeletal: Positive for arthralgias.  All other systems reviewed and are negative.    Physical Exam Triage Vital Signs ED Triage Vitals  Enc Vitals Group     BP 12/22/15 1422 128/68     Pulse Rate 12/22/15 1422 60     Resp 12/22/15 1422 16     Temp 12/22/15 1422 98 F (36.7 C)     Temp Source 12/22/15 1422 Oral     SpO2 12/22/15 1422 100 %     Weight 12/22/15 1425 245 lb (111.1 kg)     Height 12/22/15 1425 5\' 11"  (1.803 m)     Head Circumference --      Peak Flow --      Pain Score 12/22/15 1426 7     Pain Loc --      Pain Edu? --      Excl. in [place]? --    No data found.   Updated Vital Signs BP 128/68 (BP Location: Left Arm)   Pulse 60   Temp 98 F (36.7 C) (Oral)   Resp 16   Ht 5\' 11"  (1.803 m)   Wt 245 lb (111.1 kg)   SpO2 100%   BMI 34.17 kg/m   Visual Acuity Right Eye Distance:   Left Eye Distance:   Bilateral Distance:    Right Eye Near:   Left Eye Near:    Bilateral Near:     Physical Exam  Constitutional: She is oriented to person, place, and time. She appears well-developed and well-nourished.  HENT:  Head: Normocephalic.  Right Ear: Hearing, tympanic membrane, external ear and ear canal normal.  Left Ear: Hearing, external ear and ear canal normal. Tympanic membrane is bulging.  Nose: Mucosal edema and rhinorrhea present. Right sinus exhibits maxillary sinus  tenderness. Right sinus exhibits no frontal sinus tenderness. Left sinus exhibits maxillary sinus tenderness. Left sinus exhibits no frontal sinus tenderness.  Mouth/Throat: Uvula is midline, oropharynx is clear and moist and mucous membranes are normal.  Eyes: EOM are normal. Pupils are equal, round, and reactive to light.  Neck: Normal range of motion. Neck supple.  Cardiovascular: Normal rate.  Exam reveals distant heart sounds.   Pulmonary/Chest: Effort normal. She has no wheezes. She has no rales.  Musculoskeletal: Normal range of motion. She exhibits no edema, tenderness or deformity.  Lymphadenopathy:    She has cervical adenopathy.  Neurological: She is alert and oriented to person, place, and time. She has normal reflexes. No cranial nerve deficit.  Skin: Skin is warm.  Psychiatric: She has a normal mood and affect.  Vitals reviewed.    UC Treatments / Results  Labs (all labs ordered are listed, but only abnormal results are displayed) Labs Reviewed - No data to display  EKG  EKG Interpretation None       Radiology Dg Chest 2 View  Result Date: 12/22/2015 CLINICAL DATA:  Cough and congestion EXAM: CHEST  2 VIEW COMPARISON:  05/01/2015 chest radiograph. FINDINGS: Stable cardiomediastinal silhouette with normal heart size and mildly tortuous atherosclerotic thoracic aorta. No pneumothorax. No pleural effusion. Lungs appear clear, with no acute consolidative airspace disease and no pulmonary edema. IMPRESSION: No active cardiopulmonary disease.  Aortic atherosclerosis. Electronically Signed   By: Ilona Sorrel M.D.   On: 12/22/2015 15:41    Procedures Procedures (including critical care time)  Medications Ordered in UC Medications  ipratropium-albuterol (DUONEB) 0.5-2.5 (3)  MG/3ML nebulizer solution 3 mL (3 mLs Nebulization Given 12/22/15 1511)     Initial Impression / Assessment and Plan / UC Course  I have reviewed the triage vital signs and the nursing  notes.  Pertinent labs & imaging results that were available during my care of the patient were reviewed by me and considered in my medical decision making (see chart for details).  Clinical Course   For the infected right nares will give a prescription for Bactroban ointment to use a Q-tip 2-3 times a day for the sinusitis and bronchitis Ceftin 500 mg twice a day test next 1 teaspoon twice a day and Allegra-D one every 4 hours once a day. Patient was given aerosol treatment while here which seemed to help her breathing. Recommend follow-up Dr. Brynda Greathouse. Better in a week and I have opted to give her a six-day course of prednisone as well for the combined sinusitis and bronchitis. Also for the bronchospasm she was given prescription for an albuterol inhaler well.   Final Clinical Impressions(s) / UC Diagnoses   Final diagnoses:  Acute bronchitis due to other specified organisms  Bronchospasm with bronchitis, acute  Other acute sinusitis, recurrence not specified  Abrasion of nose with infection, initial encounter    New Prescriptions New Prescriptions   CEFUROXIME (CEFTIN) 500 MG TABLET    Take 1 tablet (500 mg total) by mouth 2 (two) times daily.   CHLORPHENIRAMINE-HYDROCODONE (TUSSIONEX PENNKINETIC ER) 10-8 MG/5ML SUER    Take 5 mLs by mouth every 12 (twelve) hours as needed for cough.   FEXOFENADINE-PSEUDOEPHEDRINE (ALLEGRA-D ALLERGY & CONGESTION) 180-240 MG 24 HR TABLET    Take 1 tablet by mouth daily.   MUPIROCIN OINTMENT (BACTROBAN) 2 %    Apply 1 application topically 3 (three) times daily.   PREDNISONE (STERAPRED UNI-PAK 21 TAB) 10 MG (21) TBPK TABLET    Sig 6 tablet day 1, 5 tablets day 2, 4 tablets day 3,,3tablets day 4, 2 tablets day 5, 1 tablet day 6 take all tablets orally     Note: This dictation was prepared with Dragon dictation along with smaller phrase technology. Any transcriptional errors that result from this process are unintentional.   Frederich Cha, MD 12/22/15  [phone]

## 2015-12-26 DIAGNOSIS — D500 Iron deficiency anemia secondary to blood loss (chronic): Secondary | ICD-10-CM | POA: Diagnosis not present

## 2015-12-26 DIAGNOSIS — K219 Gastro-esophageal reflux disease without esophagitis: Secondary | ICD-10-CM | POA: Diagnosis not present

## 2015-12-26 DIAGNOSIS — E785 Hyperlipidemia, unspecified: Secondary | ICD-10-CM | POA: Diagnosis not present

## 2015-12-26 DIAGNOSIS — I119 Hypertensive heart disease without heart failure: Secondary | ICD-10-CM | POA: Diagnosis not present

## 2015-12-26 DIAGNOSIS — E039 Hypothyroidism, unspecified: Secondary | ICD-10-CM | POA: Diagnosis not present

## 2016-01-09 DIAGNOSIS — M25561 Pain in right knee: Secondary | ICD-10-CM | POA: Diagnosis not present

## 2016-01-09 DIAGNOSIS — M9711XA Periprosthetic fracture around internal prosthetic right knee joint, initial encounter: Secondary | ICD-10-CM | POA: Diagnosis not present

## 2016-01-21 DIAGNOSIS — M7989 Other specified soft tissue disorders: Secondary | ICD-10-CM | POA: Diagnosis not present

## 2016-01-21 DIAGNOSIS — M25571 Pain in right ankle and joints of right foot: Secondary | ICD-10-CM | POA: Diagnosis not present

## 2016-01-21 DIAGNOSIS — M19071 Primary osteoarthritis, right ankle and foot: Secondary | ICD-10-CM | POA: Diagnosis not present

## 2016-03-11 DIAGNOSIS — R300 Dysuria: Secondary | ICD-10-CM | POA: Diagnosis not present

## 2016-03-11 DIAGNOSIS — N9089 Other specified noninflammatory disorders of vulva and perineum: Secondary | ICD-10-CM | POA: Diagnosis not present

## 2016-03-11 DIAGNOSIS — R102 Pelvic and perineal pain: Secondary | ICD-10-CM | POA: Diagnosis not present

## 2016-03-27 DIAGNOSIS — I119 Hypertensive heart disease without heart failure: Secondary | ICD-10-CM | POA: Diagnosis not present

## 2016-03-27 DIAGNOSIS — E663 Overweight: Secondary | ICD-10-CM | POA: Diagnosis not present

## 2016-03-27 DIAGNOSIS — J3081 Allergic rhinitis due to animal (cat) (dog) hair and dander: Secondary | ICD-10-CM | POA: Diagnosis not present

## 2016-06-11 DIAGNOSIS — L230 Allergic contact dermatitis due to metals: Secondary | ICD-10-CM | POA: Diagnosis not present

## 2016-07-05 ENCOUNTER — Encounter: Payer: Self-pay | Admitting: Emergency Medicine

## 2016-07-05 ENCOUNTER — Emergency Department
Admission: EM | Admit: 2016-07-05 | Discharge: 2016-07-05 | Disposition: A | Discharge status: Home / Self Care | Payer: Medicare Other | Attending: Emergency Medicine | Admitting: Emergency Medicine

## 2016-07-05 DIAGNOSIS — N183 Chronic kidney disease, stage 3 (moderate): Secondary | ICD-10-CM | POA: Insufficient documentation

## 2016-07-05 DIAGNOSIS — Z96653 Presence of artificial knee joint, bilateral: Secondary | ICD-10-CM | POA: Diagnosis not present

## 2016-07-05 DIAGNOSIS — I129 Hypertensive chronic kidney disease with stage 1 through stage 4 chronic kidney disease, or unspecified chronic kidney disease: Secondary | ICD-10-CM | POA: Diagnosis not present

## 2016-07-05 DIAGNOSIS — R21 Rash and other nonspecific skin eruption: Secondary | ICD-10-CM | POA: Insufficient documentation

## 2016-07-05 DIAGNOSIS — Z7982 Long term (current) use of aspirin: Secondary | ICD-10-CM | POA: Diagnosis not present

## 2016-07-05 DIAGNOSIS — Z87891 Personal history of nicotine dependence: Secondary | ICD-10-CM | POA: Diagnosis not present

## 2016-07-05 DIAGNOSIS — Z79899 Other long term (current) drug therapy: Secondary | ICD-10-CM | POA: Insufficient documentation

## 2016-07-05 DIAGNOSIS — Z7901 Long term (current) use of anticoagulants: Secondary | ICD-10-CM | POA: Diagnosis not present

## 2016-07-05 MED ORDER — DEXAMETHASONE SODIUM PHOSPHATE 10 MG/ML IJ SOLN
10.0000 mg | Freq: Once | INTRAMUSCULAR | Status: AC
  Administered 2016-07-05: 10 mg via INTRAMUSCULAR
  Filled 2016-07-05: qty 1

## 2016-07-05 MED ORDER — MUPIROCIN CALCIUM 2 % EX CREA: 1.0000 "application " | TOPICAL_CREAM | Freq: Two times a day (BID) | CUTANEOUS | 0 refills | Status: DC

## 2016-07-05 MED ORDER — HYDROXYZINE HCL 25 MG PO TABS: 25.0000 mg | ORAL_TABLET | Freq: Four times a day (QID) | ORAL | 0 refills | Status: DC | PRN

## 2016-07-05 NOTE — ED Provider Notes (Signed)
[facility] Emergency Department Provider Note  ____________________________________________   First MD Initiated Contact with Patient 07/05/16 1315     (approximate)  I have reviewed the triage vital signs and the nursing notes.   HISTORY  Chief Complaint Rash    HPI Michele Moore is a 66 y.o. female is here complaining of rash. Patient states that she saw her PCP and was prescribed ampicillin for a sore on her left lower leg. Patient states that she has a history of penicillin allergies which has given her a rash in the past. She states that shortly after beginning the antibiotic she began breaking out with a small red rash which has now advanced to her back and causing burning and itching. Patient is not taking any over-the-counter medication for her itching. She also complains that her tongue burns. Patient denies any difficulty eating or swallowing. She continues to talk without any difficulty. Currently she rates the burning as an 8/10.   Past Medical History:  Diagnosis Date  . Anemia   . Arthritis   . Chronic kidney disease    STAGE 3 PER DR EASON 08/02/15  . Family history of adverse reaction to anesthesia    sister difficult to put to sleep  . GERD (gastroesophageal reflux disease)   . Hemorrhoid   . History of hiatal hernia   . Hypertension   . Migraines   . Mixed hyperlipidemia   . Multiple gastric ulcers   . Thyroid disease     Patient Active Problem List   Diagnosis Date Noted  . Pain, lower extremity 08/03/2015    Past Surgical History:  Procedure Laterality Date  . ANKLE SURGERY    . CARPAL TUNNEL RELEASE     x3  . COLONOSCOPY  2000?  . FUSION OF TALONAVICULAR JOINT Right 08/03/2015   Procedure: TAILOR NAVICULAR JOINT FUSION - RIGHT ;  Surgeon: [name], DPM;  Location: [facility] ORS;  Service: Podiatry;  Laterality: Right;  . JOINT REPLACEMENT     knee x 3 ,right x1 and left x2  . ROTATOR CUFF REPAIR    . TONSILLECTOMY      . TUBAL LIGATION    . UPPER GI ENDOSCOPY  2000?    Prior to Admission medications   Medication Sig Start Date End Date Taking? Authorizing Provider  Acetaminophen (TYLENOL PO) Take 500 mg by mouth as needed.    [provider]  aspirin 81 MG tablet Take 81 mg by mouth daily.    [provider]  cefUROXime (CEFTIN) 500 MG tablet Take 1 tablet (500 mg total) by mouth 2 (two) times daily. 12/22/15   Frederich Cha, MD  chlorpheniramine-HYDROcodone [facility] PENNKINETIC ER) 10-8 MG/5ML SUER Take 5 mLs by mouth every 12 (twelve) hours as needed for cough. 12/22/15   Frederich Cha, MD  enoxaparin (LOVENOX) 40 MG/0.4ML injection Inject 0.4 mLs (40 mg total) into the skin daily. 08/17/15   [name], DPM  fexofenadine-pseudoephedrine (ALLEGRA-D ALLERGY & CONGESTION) 180-240 MG 24 hr tablet Take 1 tablet by mouth daily. 12/22/15   Frederich Cha, MD  gabapentin (NEURONTIN) 300 MG capsule Take 600 mg by mouth at bedtime.    [provider]  hydrALAZINE (APRESOLINE) 50 MG tablet Take 50 mg by mouth 2 (two) times daily.    [provider]  HYDROcodone-acetaminophen (NORCO/VICODIN) 5-325 MG tablet Take 1-2 tablets by mouth every 6 (six) hours as needed for moderate pain. 08/07/15   [name], DPM  hydrOXYzine (ATARAX/VISTARIL)  25 MG tablet Take 1 tablet (25 mg total) by mouth every 6 (six) hours as needed for itching. 07/05/16   Johnn Hai, PA-C  meloxicam (MOBIC) 15 MG tablet Take 1 tablet (15 mg total) by mouth daily. 02/21/15   Cuthriell, Charline Bills, PA-C  mupirocin cream (BACTROBAN) 2 % Apply 1 application topically 2 (two) times daily. 07/05/16   Johnn Hai, PA-C  mupirocin ointment (BACTROBAN) 2 % Apply 1 application topically 3 (three) times daily. 12/22/15   Frederich Cha, MD  omega-3 acid ethyl esters (LOVAZA) 1 G capsule Take 2 g by mouth 2 (two) times daily.  03/09/14   [provider]  omeprazole (PRILOSEC) 40 MG capsule Take 40 mg by  mouth daily.  04/07/14   [provider]  oxyCODONE-acetaminophen (PERCOCET) 7.5-325 MG tablet Take 1 tablet by mouth every 4 (four) hours as needed for severe pain. 11/16/15 11/15/16  Nena Polio, MD  Polyethyl Glycol-Propyl Glycol (SYSTANE OP) Apply to eye.    [provider]  polyethylene glycol (MIRALAX / GLYCOLAX) packet Take 17 g by mouth daily. 08/07/15   [name], DPM  potassium chloride SA (K-DUR,KLOR-CON) 20 MEQ tablet Take 20-40 mEq by mouth daily.  02/14/14   [provider]  predniSONE (STERAPRED UNI-PAK 21 TAB) 10 MG (21) TBPK tablet Sig 6 tablet day 1, 5 tablets day 2, 4 tablets day 3,,3tablets day 4, 2 tablets day 5, 1 tablet day 6 take all tablets orally 12/22/15   Frederich Cha, MD  ranitidine (ZANTAC) 150 MG tablet Take 150 mg by mouth 2 (two) times daily.  03/29/14   [provider]  SYNTHROID 150 MCG tablet Take 150 mcg by mouth daily before breakfast.  03/28/14   [provider]  tiZANidine (ZANAFLEX) 4 MG capsule Take 4 mg by mouth at bedtime as needed.     [provider]  triamterene-hydrochlorothiazide (MAXZIDE) 75-50 MG per tablet Take 1 tablet by mouth daily.  03/22/14   [provider]    Allergies Penicillins and Vibramycin [doxycycline calcium]  Family History  Problem Relation Age of Onset  . COPD Sister   . Cancer Maternal Aunt        breast  . Cancer Maternal Uncle        kidney    Social History Social History  Substance Use Topics  . Smoking status: Former Smoker    Quit date: 02/24/1993  . Smokeless tobacco: Never Used  . Alcohol use No    Review of Systems Constitutional: No fever/chills Eyes: No visual changes. ENT: No sore throat. Positive for "burning tongue". Cardiovascular: Denies chest pain. Respiratory: Denies shortness of breath. Gastrointestinal: No abdominal pain.  No nausea, no vomiting.   Musculoskeletal: Negative for back pain. Skin: Positive for rash.  Positive  rash left lower extremity. Neurological: Negative for headaches, focal weakness or numbness.  ____________________________________________   PHYSICAL EXAM:  VITAL SIGNS: ED Triage Vitals  Enc Vitals Group     BP 07/05/16 1311 (!) 155/78     Pulse Rate 07/05/16 1311 60     Resp 07/05/16 1311 18     Temp 07/05/16 1311 98 F (36.7 C)     Temp Source 07/05/16 1311 Oral     SpO2 07/05/16 1311 94 %     Weight 07/05/16 1311 248 lb (112.5 kg)     Height 07/05/16 1311 5\' 11"  (1.803 m)     Head Circumference --      Peak Flow --  Pain Score 07/05/16 1310 8     Pain Loc --      Pain Edu? --      Excl. in [place]? --     Constitutional: Alert and oriented. Well appearing and in no acute distress. Patient is talkative, swallowing saliva without any drift can't eat and talking. Patient is not in any acute distress. Eyes: Conjunctivae are normal. PERRL. EOMI. Head: Atraumatic. Nose: No congestion/rhinnorhea. Mouth/Throat: Mucous membranes are moist.  Oropharynx non-erythematous. No lesions are noted on the oral mucosa. No edema present. Neck: No stridor.   Hematological/Lymphatic/Immunilogical: No cervical lymphadenopathy. Cardiovascular: Normal rate, regular rhythm. Grossly normal heart sounds.  Good peripheral circulation. Respiratory: Normal respiratory effort.  No retractions. Lungs CTAB. Musculoskeletal: No lower extremity tenderness nor edema.  No joint effusions. Neurologic:  Normal speech and language. No gross focal neurologic deficits are appreciated. No gait instability. Skin:  Skin is warm. There is an approximately 2 cm lesion to the left lateral ankle area without soft tissue edema or drainage. No warmth is appreciated. Patient does have erythematous rash diffusely on the back bilaterally. No vesicles were noted. Patient has discrete erythematous papules infrequently on the lower extremities. Patient is frequently rubbing and scratching her skin. Psychiatric: Mood and affect are  normal. Speech and behavior are normal.  ____________________________________________   LABS (all labs ordered are listed, but only abnormal results are displayed)  Labs Reviewed - No data to display   PROCEDURES  Procedure(s) performed: None  Procedures  Critical Care performed: No  ____________________________________________   INITIAL IMPRESSION / ASSESSMENT AND PLAN / ED COURSE  Pertinent labs & imaging results that were available during my care of the patient were reviewed by me and considered in my medical decision making (see chart for details).  Patient was not in any acute distress. Patient did drive herself to the emergency room. She remained talkative and without any difficulty speaking or swallowing. Patient was given Decadron 10 mg IM while in the department. She was given a prescription for Atarax 25 mg 1 every 6 hours as needed for itching. She was also instructed to discontinue taking ampicillin immediately. Rash pattern is questionable whether this is an allergic reaction to ampicillin. She is to use Bactroban on the lesion on her left ankle area. She'll follow-up with Dr. Brynda Greathouse if any continued problems.      ____________________________________________   FINAL CLINICAL IMPRESSION(S) / ED DIAGNOSES  Final diagnoses:  Rash and nonspecific skin eruption      NEW MEDICATIONS STARTED DURING THIS VISIT:  Discharge Medication List as of 07/05/2016  2:14 PM    START taking these medications   Details  hydrOXYzine (ATARAX/VISTARIL) 25 MG tablet Take 1 tablet (25 mg total) by mouth every 6 (six) hours as needed for itching., Starting Sat 07/05/2016, Print    mupirocin cream (BACTROBAN) 2 % Apply 1 application topically 2 (two) times daily., Starting Sat 07/05/2016, Print         Note:  This document was prepared using Dragon voice recognition software and may include unintentional dictation errors.    Johnn Hai, PA-C 07/05/16 1521    Lisa Roca, MD 07/05/16 [phone]

## 2016-07-05 NOTE — ED Triage Notes (Signed)
Rash back and lags x 5 days. States took ampicillin prior and is allergic to PCN. No resp distress noted.

## 2016-07-05 NOTE — Discharge Instructions (Signed)
Follow-up with Dr. Brynda Greathouse if any continued problems. Discontinue taking ampicillin immediately. Begin using Bactroban cream to your left ankle 3 times a day. You may also begin taking Atarax 25 mg one every 6 hours as needed for itching. Be aware that this medication can cause drowsiness increase your risk for falling. You should also not drive while taking this medication.

## 2016-08-06 DIAGNOSIS — I119 Hypertensive heart disease without heart failure: Secondary | ICD-10-CM | POA: Diagnosis not present

## 2016-08-06 DIAGNOSIS — E782 Mixed hyperlipidemia: Secondary | ICD-10-CM | POA: Diagnosis not present

## 2016-08-06 DIAGNOSIS — K219 Gastro-esophageal reflux disease without esophagitis: Secondary | ICD-10-CM | POA: Diagnosis not present

## 2016-08-06 DIAGNOSIS — E031 Congenital hypothyroidism without goiter: Secondary | ICD-10-CM | POA: Diagnosis not present

## 2016-08-18 DIAGNOSIS — M48061 Spinal stenosis, lumbar region without neurogenic claudication: Secondary | ICD-10-CM | POA: Diagnosis not present

## 2016-08-18 DIAGNOSIS — M79671 Pain in right foot: Secondary | ICD-10-CM | POA: Diagnosis not present

## 2016-08-18 DIAGNOSIS — M7989 Other specified soft tissue disorders: Secondary | ICD-10-CM | POA: Diagnosis not present

## 2016-08-18 DIAGNOSIS — M19071 Primary osteoarthritis, right ankle and foot: Secondary | ICD-10-CM | POA: Diagnosis not present

## 2016-08-18 DIAGNOSIS — L301 Dyshidrosis [pompholyx]: Secondary | ICD-10-CM | POA: Diagnosis not present

## 2016-08-18 DIAGNOSIS — M5440 Lumbago with sciatica, unspecified side: Secondary | ICD-10-CM | POA: Diagnosis not present

## 2016-08-18 DIAGNOSIS — E669 Obesity, unspecified: Secondary | ICD-10-CM | POA: Diagnosis not present

## 2016-08-18 DIAGNOSIS — M5416 Radiculopathy, lumbar region: Secondary | ICD-10-CM | POA: Diagnosis not present

## 2016-08-18 DIAGNOSIS — M9689 Other intraoperative and postprocedural complications and disorders of the musculoskeletal system: Secondary | ICD-10-CM | POA: Diagnosis not present

## 2016-08-20 ENCOUNTER — Other Ambulatory Visit: Payer: Self-pay | Admitting: Orthopedic Surgery

## 2016-08-20 DIAGNOSIS — M48061 Spinal stenosis, lumbar region without neurogenic claudication: Secondary | ICD-10-CM

## 2016-08-20 DIAGNOSIS — M5440 Lumbago with sciatica, unspecified side: Secondary | ICD-10-CM

## 2016-08-20 DIAGNOSIS — M545 Low back pain, unspecified: Secondary | ICD-10-CM

## 2016-08-20 DIAGNOSIS — M5416 Radiculopathy, lumbar region: Principal | ICD-10-CM

## 2016-08-26 ENCOUNTER — Ambulatory Visit
Admission: RE | Admit: 2016-08-26 | Discharge: 2016-08-26 | Disposition: A | Discharge status: Home / Self Care | Payer: Medicare Other | Source: Ambulatory Visit | Attending: Orthopedic Surgery | Admitting: Orthopedic Surgery

## 2016-08-26 DIAGNOSIS — M545 Low back pain, unspecified: Secondary | ICD-10-CM

## 2016-08-26 DIAGNOSIS — M4726 Other spondylosis with radiculopathy, lumbar region: Secondary | ICD-10-CM | POA: Insufficient documentation

## 2016-08-26 DIAGNOSIS — M5440 Lumbago with sciatica, unspecified side: Secondary | ICD-10-CM

## 2016-08-26 DIAGNOSIS — M5116 Intervertebral disc disorders with radiculopathy, lumbar region: Secondary | ICD-10-CM | POA: Diagnosis not present

## 2016-08-26 DIAGNOSIS — M48061 Spinal stenosis, lumbar region without neurogenic claudication: Secondary | ICD-10-CM | POA: Insufficient documentation

## 2016-08-26 DIAGNOSIS — M5416 Radiculopathy, lumbar region: Secondary | ICD-10-CM

## 2016-09-04 DIAGNOSIS — M5442 Lumbago with sciatica, left side: Secondary | ICD-10-CM | POA: Diagnosis not present

## 2016-09-04 DIAGNOSIS — M48062 Spinal stenosis, lumbar region with neurogenic claudication: Secondary | ICD-10-CM | POA: Diagnosis not present

## 2016-09-04 DIAGNOSIS — M5416 Radiculopathy, lumbar region: Secondary | ICD-10-CM | POA: Diagnosis not present

## 2016-09-08 DIAGNOSIS — M9689 Other intraoperative and postprocedural complications and disorders of the musculoskeletal system: Secondary | ICD-10-CM | POA: Diagnosis not present

## 2016-09-08 DIAGNOSIS — M7989 Other specified soft tissue disorders: Secondary | ICD-10-CM | POA: Diagnosis not present

## 2016-09-08 DIAGNOSIS — M19071 Primary osteoarthritis, right ankle and foot: Secondary | ICD-10-CM | POA: Diagnosis not present

## 2016-10-14 ENCOUNTER — Encounter: Payer: Self-pay | Admitting: Student in an Organized Health Care Education/Training Program

## 2016-10-14 ENCOUNTER — Ambulatory Visit
Payer: Medicare Other | Attending: Student in an Organized Health Care Education/Training Program | Admitting: Student in an Organized Health Care Education/Training Program

## 2016-10-14 VITALS — BP 134/100 | HR 91 | Temp 98.4°F | Resp 20 | Ht 71.0 in | Wt 240.0 lb

## 2016-10-14 DIAGNOSIS — E782 Mixed hyperlipidemia: Secondary | ICD-10-CM | POA: Diagnosis not present

## 2016-10-14 DIAGNOSIS — Z791 Long term (current) use of non-steroidal anti-inflammatories (NSAID): Secondary | ICD-10-CM | POA: Diagnosis not present

## 2016-10-14 DIAGNOSIS — M2578 Osteophyte, vertebrae: Secondary | ICD-10-CM | POA: Diagnosis not present

## 2016-10-14 DIAGNOSIS — D649 Anemia, unspecified: Secondary | ICD-10-CM | POA: Diagnosis not present

## 2016-10-14 DIAGNOSIS — Z79899 Other long term (current) drug therapy: Secondary | ICD-10-CM | POA: Diagnosis not present

## 2016-10-14 DIAGNOSIS — M47816 Spondylosis without myelopathy or radiculopathy, lumbar region: Secondary | ICD-10-CM

## 2016-10-14 DIAGNOSIS — M069 Rheumatoid arthritis, unspecified: Secondary | ICD-10-CM | POA: Diagnosis not present

## 2016-10-14 DIAGNOSIS — G43909 Migraine, unspecified, not intractable, without status migrainosus: Secondary | ICD-10-CM | POA: Diagnosis not present

## 2016-10-14 DIAGNOSIS — M4726 Other spondylosis with radiculopathy, lumbar region: Secondary | ICD-10-CM | POA: Diagnosis not present

## 2016-10-14 DIAGNOSIS — M5136 Other intervertebral disc degeneration, lumbar region: Secondary | ICD-10-CM

## 2016-10-14 DIAGNOSIS — Z8711 Personal history of peptic ulcer disease: Secondary | ICD-10-CM | POA: Diagnosis not present

## 2016-10-14 DIAGNOSIS — M25552 Pain in left hip: Secondary | ICD-10-CM | POA: Diagnosis not present

## 2016-10-14 DIAGNOSIS — Z96651 Presence of right artificial knee joint: Secondary | ICD-10-CM | POA: Diagnosis not present

## 2016-10-14 DIAGNOSIS — M810 Age-related osteoporosis without current pathological fracture: Secondary | ICD-10-CM | POA: Diagnosis not present

## 2016-10-14 DIAGNOSIS — M533 Sacrococcygeal disorders, not elsewhere classified: Secondary | ICD-10-CM | POA: Diagnosis not present

## 2016-10-14 DIAGNOSIS — K219 Gastro-esophageal reflux disease without esophagitis: Secondary | ICD-10-CM | POA: Insufficient documentation

## 2016-10-14 DIAGNOSIS — M48062 Spinal stenosis, lumbar region with neurogenic claudication: Secondary | ICD-10-CM | POA: Diagnosis not present

## 2016-10-14 DIAGNOSIS — I129 Hypertensive chronic kidney disease with stage 1 through stage 4 chronic kidney disease, or unspecified chronic kidney disease: Secondary | ICD-10-CM | POA: Diagnosis not present

## 2016-10-14 DIAGNOSIS — M545 Low back pain: Secondary | ICD-10-CM | POA: Diagnosis present

## 2016-10-14 DIAGNOSIS — Z87442 Personal history of urinary calculi: Secondary | ICD-10-CM | POA: Insufficient documentation

## 2016-10-14 DIAGNOSIS — G894 Chronic pain syndrome: Secondary | ICD-10-CM

## 2016-10-14 DIAGNOSIS — Z7982 Long term (current) use of aspirin: Secondary | ICD-10-CM | POA: Diagnosis not present

## 2016-10-14 DIAGNOSIS — M5116 Intervertebral disc disorders with radiculopathy, lumbar region: Secondary | ICD-10-CM | POA: Diagnosis not present

## 2016-10-14 DIAGNOSIS — N189 Chronic kidney disease, unspecified: Secondary | ICD-10-CM | POA: Diagnosis not present

## 2016-10-14 DIAGNOSIS — Z87891 Personal history of nicotine dependence: Secondary | ICD-10-CM | POA: Diagnosis not present

## 2016-10-14 DIAGNOSIS — M5416 Radiculopathy, lumbar region: Secondary | ICD-10-CM

## 2016-10-14 MED ORDER — TIZANIDINE HCL 4 MG PO CAPS: 4.0000 mg | ORAL_CAPSULE | Freq: Two times a day (BID) | ORAL | 2 refills | Status: DC | PRN

## 2016-10-14 NOTE — Patient Instructions (Signed)
Preparing for your procedure (without sedation) Instructions: . Oral Intake: Do not eat or drink anything for at least 3 hours prior to your procedure. . Transportation: Unless otherwise stated by your physician, you may drive yourself after the procedure. . Blood Pressure Medicine: Take your blood pressure medicine with a sip of water the morning of the procedure. . Insulin: Take only  of your normal insulin dose. . Preventing infections: Shower with an antibacterial soap the morning of your procedure. . Build-up your immune system: Take 1000 mg of Vitamin C with every meal (3 times a day) the day prior to your procedure. . Pregnancy: If you are pregnant, call and cancel the procedure. . Sickness: If you have a cold, fever, or any active infections, call and cancel the procedure. . Arrival: You must be in the facility at least 30 minutes prior to your scheduled procedure. . Children: Do not bring any children with you. . Dress appropriately: Bring dark clothing that you would not mind if they get stained. . Valuables: Do not bring any jewelry or valuables. Procedure appointments are reserved for interventional treatments only. Marland Kitchen No Prescription Refills. . No medication changes will be discussed during procedure appointments. No disability issues will be discussed.Sacroiliac (SI) Joint Injection Patient Information  Description: The sacroiliac joint connects the scrum (very low back and tailbone) to the ilium (a pelvic bone which also forms half of the hip joint).  Normally this joint experiences very little motion.  When this joint becomes inflamed or unstable low back and or hip and pelvis pain may result.  Injection of this joint with local anesthetics (numbing medicines) and steroids can provide diagnostic information and reduce pain.  This injection is performed with the aid of x-ray guidance into the tailbone area while you are lying on your stomach.   You may experience an electrical  sensation down the leg while this is being done.  You may also experience numbness.  We also may ask if we are reproducing your normal pain during the injection.  Conditions which may be treated SI injection:   Low back, buttock, hip or leg pain  Preparation for the Injection:  1. Do not eat any solid food or dairy products within 8 hours of your appointment.  2. You may drink clear liquids up to 3 hours before appointment.  Clear liquids include water, black coffee, juice or soda.  No milk or cream please. 3. You may take your regular medications, including pain medications with a sip of water before your appointment.  Diabetics should hold regular insulin (if take separately) and take 1/2 normal NPH dose the morning of the procedure.  Carry some sugar containing items with you to your appointment. 4. A driver must accompany you and be prepared to drive you home after your procedure. 5. Bring all of your current medications with you. 6. An IV may be inserted and sedation may be given at the discretion of the physician. 7. A blood pressure cuff, EKG and other monitors will often be applied during the procedure.  Some patients may need to have extra oxygen administered for a short period.  8. You will be asked to provide medical information, including your allergies, prior to the procedure.  We must know immediately if you are taking blood thinners (like Coumadin/Warfarin) or if you are allergic to IV iodine contrast (dye).  We must know if you could possible be pregnant.  Possible side effects:   Bleeding from needle site  Infection (  rare, may require surgery)  Nerve injury (rare)  Numbness & tingling (temporary)  A brief convulsion or seizure  Light-headedness (temporary)  Pain at injection site (several days)  Decreased blood pressure (temporary)  Weakness in the leg (temporary)   Call if you experience:   New onset weakness or numbness of an extremity below the injection  site that last more than 8 hours.  Hives or difficulty breathing ( go to the emergency room)  Inflammation or drainage at the injection site  Any new symptoms which are concerning to you  Please note:  Although the local anesthetic injected can often make your back/ hip/ buttock/ leg feel good for several hours after the injections, the pain will likely return.  It takes 3-7 days for steroids to work in the sacroiliac area.  You may not notice any pain relief for at least that one week.  If effective, we will often do a series of three injections spaced 3-6 weeks apart to maximally decrease your pain.  After the initial series, we generally will wait some months before a repeat injection of the same type.  If you have any questions, please call [phone] [facility]

## 2016-10-14 NOTE — Progress Notes (Signed)
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 08/05/2015   CALCIUM 8.6 (L) 08/05/2015   MG 2.2 12/27/2013                 Neuropathy Markers No results found for: [id number]               Bone Pathology Markers Lab Results  Component Value Date   ALKPHOS 91 12/27/2013   CALCIUM 8.6 (L) 08/05/2015  Coagulation Parameters Lab Results  Component Value Date   PLT 150 08/03/2015                 Cardiovascular Markers Lab Results  Component Value Date   HGB 10.6 (L) 08/03/2015   HCT 32.4 (L) 08/03/2015                 Note: Lab results reviewed.  PFSH  Drug: Ms. Brodhead  reports that she does not use drugs. Alcohol:  reports that she does not drink alcohol. Tobacco:  reports that she quit smoking about 23 years ago. She has never used smokeless tobacco. Medical:  has a past medical history of Anemia; Arthritis; Chronic kidney disease; Family history of adverse reaction to anesthesia; GERD (gastroesophageal reflux disease); Hemorrhoid; History of hiatal  hernia; Hypertension; Migraines; Mixed hyperlipidemia; Multiple gastric ulcers; and Thyroid disease. Family: family history includes COPD in her sister; Cancer in her maternal aunt and maternal uncle.  Past Surgical History:  Procedure Laterality Date  . ANKLE SURGERY    . CARPAL TUNNEL RELEASE     x3  . COLONOSCOPY  2000?  . FUSION OF TALONAVICULAR JOINT Right 08/03/2015   Procedure: TAILOR NAVICULAR JOINT FUSION - RIGHT ;  Surgeon: [name], DPM;  Location: [facility] ORS;  Service: Podiatry;  Laterality: Right;  . JOINT REPLACEMENT     knee x 3 ,right x1 and left x2  . ROTATOR CUFF REPAIR    . TONSILLECTOMY    . TUBAL LIGATION    . UPPER GI ENDOSCOPY  2000?   Active Ambulatory Problems    Diagnosis Date Noted  . Pain, lower extremity 08/03/2015   Resolved Ambulatory Problems    Diagnosis Date Noted  . No Resolved Ambulatory Problems   Past Medical History:  Diagnosis Date  . Anemia   . Arthritis   . Chronic kidney disease   . Family history of adverse reaction to anesthesia   . GERD (gastroesophageal reflux disease)   . Hemorrhoid   . History of hiatal hernia   . Hypertension   . Migraines   . Mixed hyperlipidemia   . Multiple gastric ulcers   . Thyroid disease    Constitutional Exam  General appearance: moderately obese and Well nourished, well developed, and well hydrated. In no apparent acute distress Vitals:   10/14/16 1348  BP: (!) 134/100  Pulse: 91  Resp: 20  Temp: 98.4 F (36.9 C)  SpO2: 98%  Weight: 240 lb (108.9 kg)  Height: '5\' 11"'  (1.803 m)   BMI Assessment: Estimated body mass index is 33.47 kg/m as calculated from the following:   Height as of this encounter: '5\' 11"'  (1.803 m).   Weight as of this encounter: 240 lb (108.9 kg).  BMI interpretation table: BMI level Category Range association with higher incidence of chronic pain  <18 kg/m2 Underweight   18.5-24.9 kg/m2 Ideal body weight   25-29.9 kg/m2 Overweight Increased incidence by 20%   30-34.9 kg/m2 Obese (Class I) Increased incidence by 68%  35-39.9 kg/m2 Severe obesity (Class II) Increased incidence by 136%  >40 kg/m2 Extreme obesity (Class III) Increased incidence by 254%   BMI Readings from Last 4 Encounters:  10/14/16 33.47 kg/m  07/05/16 34.59 kg/m  12/22/15 34.17 kg/m  11/16/15 30.92 kg/m   Wt Readings from Last 4 Encounters:  10/14/16 240 lb (108.9 kg)  07/05/16 248 lb (112.5 kg)  12/22/15 245 lb (111.1 kg)  11/16/15 228 lb (103.4 kg)  Psych/Mental status: Alert,  oriented x 3 (person, place, & time)       Eyes: PERLA Respiratory: No evidence of acute respiratory distress  Cervical Spine Area Exam  Skin & Axial Inspection: No masses, redness, edema, swelling, or associated skin lesions Alignment: Symmetrical Functional ROM: Decreased ROM      Stability: No instability detected Muscle Tone/Strength: Functionally intact. No obvious neuro-muscular anomalies detected. Sensory (Neurological): Movement-associated discomfort Palpation: Complains of area being tender to palpation              Upper Extremity (UE) Exam    Side: Right upper extremity  Side: Left upper extremity  Skin & Extremity Inspection: Edema  Skin & Extremity Inspection: Edema  Functional ROM: Decreased ROM          Functional ROM: Decreased ROM          Muscle Tone/Strength: Functionally intact. No obvious neuro-muscular anomalies detected.  Muscle Tone/Strength: Functionally intact. No obvious neuro-muscular anomalies detected.  Sensory (Neurological): Unimpaired  Sensory (Neurological): Unimpaired  Palpation: No palpable anomalies              Palpation: No palpable anomalies              Specialized Test(s): Deferred         Specialized Test(s): Deferred          Thoracic Spine Area Exam  Skin & Axial Inspection: No masses, redness, or swelling Alignment: Symmetrical Functional ROM: Decreased ROM Stability: No instability detected Muscle Tone/Strength: Functionally intact. No  obvious neuro-muscular anomalies detected. Sensory (Neurological): Movement associated pain Muscle strength & Tone: Complains of area being tender to palpation  Lumbar Spine Area Exam  Skin & Axial Inspection: No masses, redness, or swelling Alignment: Symmetrical Functional ROM: Decreased ROM      Stability: No instability detected Muscle Tone/Strength: Functionally intact. No obvious neuro-muscular anomalies detected. Sensory (Neurological): Movement-associated discomfort Palpation: Complains of area being tender to palpation       Provocative Tests: Lumbar Hyperextension and rotation test: Positive       Lumbar Lateral bending test: Positive       Patrick's Maneuver: Positive, bilaterally                    Gait & Posture Assessment  Ambulation: Patient ambulates using a walker Gait: Limited. Using assistive device to ambulate Posture: WNL   Lower Extremity Exam    Side: Right lower extremity  Side: Left lower extremity  Skin & Extremity Inspection: Edema  Skin & Extremity Inspection: Edema  Functional ROM: Decreased ROM          Functional ROM: Decreased ROM          Muscle Tone/Strength: Movement possible against some resistance (4/5), Plantarflexion, dorsiflexion, knee flexion. Mainly limited by pain   Muscle Tone/Strength: Functionally intact. No obvious neuro-muscular anomalies detected.  Sensory (Neurological): Arthropathic arthralgia  Sensory (Neurological): Arthropathic arthralgia  Palpation: Complains of area being tender to palpation  Palpation: Complains of area being tender to palpation   Assessment  Primary Diagnosis & Pertinent Problem List: The primary encounter diagnosis was Lumbar spondylosis. Diagnoses of Spinal stenosis, lumbar region, with neurogenic claudication, Lumbar degenerative disc disease, Lumbar radiculopathy, Chronic pain syndrome, and SI (sacroiliac) joint dysfunction were also pertinent to this visit.  Visit Diagnosis (New problems to  examiner): 1. Lumbar spondylosis   2. Spinal stenosis, lumbar region, with neurogenic claudication   3. Lumbar degenerative disc disease   4. Lumbar radiculopathy   5. Chronic pain  syndrome   6. SI (sacroiliac) joint dysfunction    Plan of Care (Initial workup plan)  Note: Please be advised that as per protocol, today's visit has been an evaluation only. We have not taken over the patient's controlled substance management.  66 year old female with a past medical history of hypertension, migraines, osteoporosis, rheumatoid arthritis who has low back pain which radiates to the left leg. Patient's lumbar MRI is significant for severe lumbar spondylosis and degenerative disc disease causing prominent impingement at L5-S1, moderate impingement at L3-L4 and mild impingement at L4-L5, left greater than right. Patient has had epidural steroid injections approximately one year ago which didn't really help. It may be reasonable to repeat this however using a caudal approach to maximize the amount of volume that can be injected into the epidural space. Patient also has pain with facet loading and lateral extension. MRI is significant for lumbar spondylosis and facet arthropathy. Patient could benefit from lumbar medial branch blocks with goal radiofrequency ablation.   On exam patient is endorsing severe left-sided hip pain (left greater than right) with positive Patrick's test bilaterally. She could have SI joint arthralgia and so we discussed doing bilateral SI joint injections which the patient was in agreement with. These will be diagnostic injections in that they're not effective, I will focus my interventional efforts on her lumbar spine and we discussed doing bilateral lumbar medial branch blocks with goal radio frequency ablation.  We had a discussion about spinal cord stimulation, its risks and benefits and patient would like to explore alternative interventional therapies before considering spinal cord  stimulation. If she would like to proceed with trial, she will need thoracic MRI along with pain psychology evaluation.  In regards to medication management, patient can continue gabapentin 600 mg daily at bedtime, Mobic 15 mg daily. Patient is endorsing muscle spasms most pronounced in her left neck and shoulder region. I'll prescribe her tizanidine 4 mg twice a day when necessary for her muscle spasms. I will have the patient see pain psych regarding appropriateness of chronic opioid therapy. We will also obtain urine drug screen today.  Plan: #1. Scheduled for bilateral sacroiliac joint injections #2. Continue gabapentin 600 mg daily at bedtime, Mobic 15 mg daily. Patient counseled on chronic NSAID therapy and its risks of contributing to chronic kidney disease and GI ulcers. I told her to limit her dose to when she is having pain flares. #3. Referral to pain psychology for appropriateness of chronic opioid therapy. Urine drug screen today.   Ordered Lab-work, Procedure(s), Referral(s), & Consult(s): Orders Placed This Encounter  Procedures  . SACROILIAC JOINT INJECTINS  . Compliance Drug Analysis, Ur  . Ambulatory referral to Psychology   Pharmacotherapy (current): Medications ordered:  Meds ordered this encounter  Medications  . tiZANidine (ZANAFLEX) 4 MG capsule    Sig: Take 1 capsule (4 mg total) by mouth 2 (two) times daily as needed.    Dispense:  60 capsule    Refill:  2   Medications administered during this visit: Ms. Pincock had no medications administered during this visit.   Pharmacological management options:  Opioid Analgesics: The patient was informed that there is no guarantee that she would be a candidate for opioid analgesics. The decision will be made following CDC guidelines. This decision will be based on the results of diagnostic studies, as well as Ms. Sopko's risk profile. Pending pain psych evaluation, can consider Roxicodone 5 mg twice a day when necessary or  hydrocodone 5 mg twice  a day when necessary   Membrane stabilizer: To be determined at a later time currently on gabapentin 600 mg daily at bedtime, can consider increase in the future or trialing Lyrica. Patient may also benefit from Topamax that she has recently been endorsing migraines.   Muscle relaxant: To be determined at a later time trial of tizanidine   NSAID: To be determined at a later time on Mobic 15 mg daily   Other analgesic(s): To be determined at a later time Voltaren GEL, TENS, SNRI, TENS    Interventional management options: Ms. Vantassell was informed that there is no guarantee that she would be a candidate for interventional therapies. The decision will be based on the results of diagnostic studies, as well as Ms. Kubin's risk profile.  Procedure(s) under consideration:  -Caudal epidural steroid injection -L3, L4, L5 medial branch nerve block with goal radiofrequency ablation -SI joint injection.  -Botox therapy for chronic migraines  -Spinal cord stimulator trial    Provider-requested follow-up: No Follow-up on file.  Future Appointments Date Time Provider [place]  10/20/2016 9:45 AM Gillis Santa, MD [facility] None    Primary Care Physician: Marden Noble, MD Location: [facility] Note by: Gillis Santa, M.D, Date: 10/14/2016; Time: 4:06 PM  Patient Instructions  Preparing for your procedure (without sedation) Instructions: . Oral Intake: Do not eat or drink anything for at least 3 hours prior to your procedure. . Transportation: Unless otherwise stated by your physician, you may drive yourself after the procedure. . Blood Pressure Medicine: Take your blood pressure medicine with a sip of water the morning of the procedure. . Insulin: Take only  of your normal insulin dose. . Preventing infections: Shower with an antibacterial soap the morning of your procedure. . Build-up your immune system: Take 1000 mg of Vitamin C with  every meal (3 times a day) the day prior to your procedure. . Pregnancy: If you are pregnant, call and cancel the procedure. . Sickness: If you have a cold, fever, or any active infections, call and cancel the procedure. . Arrival: You must be in the facility at least 30 minutes prior to your scheduled procedure. . Children: Do not bring any children with you. . Dress appropriately: Bring dark clothing that you would not mind if they get stained. . Valuables: Do not bring any jewelry or valuables. Procedure appointments are reserved for interventional treatments only. Marland Kitchen No Prescription Refills. . No medication changes will be discussed during procedure appointments. No disability issues will be discussed.Sacroiliac (SI) Joint Injection Patient Information  Description: The sacroiliac joint connects the scrum (very low back and tailbone) to the ilium (a pelvic bone which also forms half of the hip joint).  Normally this joint experiences very little motion.  When this joint becomes inflamed or unstable low back and or hip and pelvis pain may result.  Injection of this joint with local anesthetics (numbing medicines) and steroids can provide diagnostic information and reduce pain.  This injection is performed with the aid of x-ray guidance into the tailbone area while you are lying on your stomach.   You may experience an electrical sensation down the leg while this is being done.  You may also experience numbness.  We also may ask if we are reproducing your normal pain during the injection.  Conditions which may be treated SI injection:   Low back, buttock, hip or leg pain  Preparation for the Injection:  1. Do not eat any solid  food or dairy products within 8 hours of your appointment.  2. You may drink clear liquids up to 3 hours before appointment.  Clear liquids include water, black coffee, juice or soda.  No milk or cream please. 3. You may take your regular medications, including pain  medications with a sip of water before your appointment.  Diabetics should hold regular insulin (if take separately) and take 1/2 normal NPH dose the morning of the procedure.  Carry some sugar containing items with you to your appointment. 4. A driver must accompany you and be prepared to drive you home after your procedure. 5. Bring all of your current medications with you. 6. An IV may be inserted and sedation may be given at the discretion of the physician. 7. A blood pressure cuff, EKG and other monitors will often be applied during the procedure.  Some patients may need to have extra oxygen administered for a short period.  8. You will be asked to provide medical information, including your allergies, prior to the procedure.  We must know immediately if you are taking blood thinners (like Coumadin/Warfarin) or if you are allergic to IV iodine contrast (dye).  We must know if you could possible be pregnant.  Possible side effects:   Bleeding from needle site  Infection (rare, may require surgery)  Nerve injury (rare)  Numbness & tingling (temporary)  A brief convulsion or seizure  Light-headedness (temporary)  Pain at injection site (several days)  Decreased blood pressure (temporary)  Weakness in the leg (temporary)   Call if you experience:   New onset weakness or numbness of an extremity below the injection site that last more than 8 hours.  Hives or difficulty breathing ( go to the emergency room)  Inflammation or drainage at the injection site  Any new symptoms which are concerning to you  Please note:  Although the local anesthetic injected can often make your back/ hip/ buttock/ leg feel good for several hours after the injections, the pain will likely return.  It takes 3-7 days for steroids to work in the sacroiliac area.  You may not notice any pain relief for at least that one week.  If effective, we will often do a series of three injections spaced 3-6  weeks apart to maximally decrease your pain.  After the initial series, we generally will wait some months before a repeat injection of the same type.  If you have any questions, please call [phone] [facility]

## 2016-10-14 NOTE — Progress Notes (Signed)
Safety precautions to be maintained throughout the outpatient stay will include: orient to surroundings, keep bed in low position, maintain call bell within reach at all times, provide assistance with transfer out of bed and ambulation.  

## 2016-10-18 LAB — COMPLIANCE DRUG ANALYSIS, UR

## 2016-10-20 ENCOUNTER — Encounter: Payer: Self-pay | Admitting: Student in an Organized Health Care Education/Training Program

## 2016-10-20 ENCOUNTER — Ambulatory Visit (HOSPITAL_BASED_OUTPATIENT_CLINIC_OR_DEPARTMENT_OTHER): Payer: Medicare Other | Admitting: Student in an Organized Health Care Education/Training Program

## 2016-10-20 ENCOUNTER — Ambulatory Visit
Admission: RE | Admit: 2016-10-20 | Discharge: 2016-10-20 | Disposition: A | Discharge status: Home / Self Care | Payer: Medicare Other | Source: Ambulatory Visit | Attending: Student in an Organized Health Care Education/Training Program | Admitting: Student in an Organized Health Care Education/Training Program

## 2016-10-20 VITALS — BP 123/64 | HR 54 | Temp 97.6°F | Resp 14 | Ht 71.0 in | Wt 240.0 lb

## 2016-10-20 DIAGNOSIS — M25551 Pain in right hip: Secondary | ICD-10-CM | POA: Diagnosis not present

## 2016-10-20 DIAGNOSIS — M5416 Radiculopathy, lumbar region: Secondary | ICD-10-CM

## 2016-10-20 DIAGNOSIS — M79671 Pain in right foot: Secondary | ICD-10-CM | POA: Diagnosis not present

## 2016-10-20 DIAGNOSIS — Z7982 Long term (current) use of aspirin: Secondary | ICD-10-CM | POA: Insufficient documentation

## 2016-10-20 DIAGNOSIS — Z888 Allergy status to other drugs, medicaments and biological substances status: Secondary | ICD-10-CM | POA: Diagnosis not present

## 2016-10-20 DIAGNOSIS — M24674 Ankylosis, right foot: Secondary | ICD-10-CM | POA: Insufficient documentation

## 2016-10-20 DIAGNOSIS — M25552 Pain in left hip: Secondary | ICD-10-CM | POA: Diagnosis not present

## 2016-10-20 DIAGNOSIS — M4698 Unspecified inflammatory spondylopathy, sacral and sacrococcygeal region: Secondary | ICD-10-CM

## 2016-10-20 DIAGNOSIS — Z880 Allergy status to penicillin: Secondary | ICD-10-CM | POA: Diagnosis not present

## 2016-10-20 DIAGNOSIS — S92254K Nondisplaced fracture of navicular [scaphoid] of right foot, subsequent encounter for fracture with nonunion: Secondary | ICD-10-CM | POA: Diagnosis not present

## 2016-10-20 DIAGNOSIS — M9689 Other intraoperative and postprocedural complications and disorders of the musculoskeletal system: Secondary | ICD-10-CM | POA: Diagnosis not present

## 2016-10-20 DIAGNOSIS — M19071 Primary osteoarthritis, right ankle and foot: Secondary | ICD-10-CM | POA: Diagnosis not present

## 2016-10-20 DIAGNOSIS — M25572 Pain in left ankle and joints of left foot: Secondary | ICD-10-CM | POA: Diagnosis not present

## 2016-10-20 DIAGNOSIS — M47818 Spondylosis without myelopathy or radiculopathy, sacral and sacrococcygeal region: Secondary | ICD-10-CM

## 2016-10-20 DIAGNOSIS — Z79899 Other long term (current) drug therapy: Secondary | ICD-10-CM | POA: Diagnosis not present

## 2016-10-20 DIAGNOSIS — Z9889 Other specified postprocedural states: Secondary | ICD-10-CM | POA: Diagnosis not present

## 2016-10-20 DIAGNOSIS — M19072 Primary osteoarthritis, left ankle and foot: Secondary | ICD-10-CM | POA: Diagnosis not present

## 2016-10-20 MED ORDER — LACTATED RINGERS IV SOLN: 1000.0000 mL | Freq: Once | INTRAVENOUS | Status: DC

## 2016-10-20 MED ORDER — TRIAMCINOLONE ACETONIDE 40 MG/ML IJ SUSP
40.0000 mg | Freq: Once | INTRAMUSCULAR | Status: AC
  Administered 2016-10-20: 40 mg via INTRA_ARTICULAR

## 2016-10-20 MED ORDER — SODIUM CHLORIDE 0.9 % IJ SOLN
INTRAMUSCULAR | Status: AC
  Filled 2016-10-20: qty 10

## 2016-10-20 MED ORDER — ROPIVACAINE HCL 2 MG/ML IJ SOLN
INTRAMUSCULAR | Status: AC
  Filled 2016-10-20: qty 10

## 2016-10-20 MED ORDER — LIDOCAINE HCL (PF) 1 % IJ SOLN
INTRAMUSCULAR | Status: AC
  Filled 2016-10-20: qty 5

## 2016-10-20 MED ORDER — SODIUM CHLORIDE 0.9% FLUSH: 2.0000 mL | Freq: Once | INTRAVENOUS | Status: DC

## 2016-10-20 MED ORDER — LIDOCAINE HCL (PF) 1 % IJ SOLN
10.0000 mL | Freq: Once | INTRAMUSCULAR | Status: AC
  Administered 2016-10-20: 5 mL

## 2016-10-20 MED ORDER — TRIAMCINOLONE ACETONIDE 40 MG/ML IJ SUSP
INTRAMUSCULAR | Status: AC
  Filled 2016-10-20: qty 1

## 2016-10-20 MED ORDER — FENTANYL CITRATE (PF) 100 MCG/2ML IJ SOLN
25.0000 ug | INTRAMUSCULAR | Status: DC | PRN
  Administered 2016-10-20: 25 ug via INTRAVENOUS
  Filled 2016-10-20: qty 2

## 2016-10-20 MED ORDER — ROPIVACAINE HCL 2 MG/ML IJ SOLN
4.0000 mL | Freq: Once | INTRAMUSCULAR | Status: AC
  Administered 2016-10-20: 4 mL via INTRA_ARTICULAR

## 2016-10-20 NOTE — Patient Instructions (Signed)
Post-procedure Information What to expect: Most procedures involve the use of a local anesthetic (numbing medicine), and a steroid (anti-inflammatory medicine).  The local anesthetics may cause temporary numbness and weakness of the legs or arms, depending on the location of the block. This numbness/weakness may last 4-6 hours, depending on the local anesthetic used. In rare instances, it can last up to 24 hours. While numb, you must be very careful not to injure the extremity.  After any procedure, you could expect the pain to get better within 15-20 minutes. This relief is temporary and may last 4-6 hours. Once the local anesthetics wears off, you could experience discomfort, possibly more than usual, for up to 10 (ten) days. In the case of radiofrequencies, it may last up to 6 weeks. Surgeries may take up to 8 weeks for the healing process. The discomfort is due to the irritation caused by needles going through skin and muscle. To minimize the discomfort, we recommend using ice the first day, and heat from then on. The ice should be applied for 15 minutes on, and 15 minutes off. Keep repeating this cycle until bedtime. Avoid applying the ice directly to the skin, to prevent frostbite. Heat should be used daily, until the pain improves (4-10 days). Be careful not to burn yourself.  Occasionally you may experience muscle spasms or cramps. These occur as a consequence of the irritation caused by the needle sticks to the muscle and the blood that will inevitably be lost into the surrounding muscle tissue. Blood tends to be very irritating to tissues, which tend to react by going into spasm. These spasms may start the same day of your procedure, but they may also take days to develop. This late onset type of spasm or cramp is usually caused by electrolyte imbalances triggered by the steroids, at the level of the kidney. Cramps and spasms tend to respond well to muscle relaxants, multivitamins (some are  triggered by the procedure, but may have their origins in vitamin deficiencies), and "Gatorade", or any sports drinks that can replenish any electrolyte imbalances. (If you are a diabetic, ask your pharmacist to get you a sugar-free brand.) Warm showers or baths may also be helpful. Stretching exercises are highly recommended. General Instructions:  Be alert for signs of possible infection: redness, swelling, heat, red streaks, elevated temperature, and/or fever. These typically appear 4 to 6 days after the procedure. Immediately notify your doctor if you experience unusual bleeding, difficulty breathing, or loss of bowel or bladder control. If you experience increased pain, do not increase your pain medicine intake, unless instructed by your pain physician. Post-Procedure Care:  Be careful in moving about. Muscle spasms in the area of the injection may occur. Applying ice or heat to the area is often helpful. The incidence of spinal headaches after epidural injections ranges between 1.4% and 6%. If you develop a headache that does not seem to respond to conservative therapy, please let your physician know. This can be treated with an epidural blood patch.   Post-procedure numbness or redness is to be expected, however it should average 4 to 6 hours. If numbness and weakness of your extremities begins to develop 4 to 6 hours after your procedure, and is felt to be progressing and worsening, immediately contact your physician.   Diet:  If you experience nausea, do not eat until this sensation goes away. If you had a "Stellate Ganglion Block" for upper extremity "Reflex Sympathetic Dystrophy", do not eat or drink until your   hoarseness goes away. In any case, always start with liquids first and if you tolerate them well, then slowly progress to more solid foods. Activity:  For the first 4 to 6 hours after the procedure, use caution in moving about as you may experience numbness and/or weakness. Use caution in  cooking, using household electrical appliances, and climbing steps. If you need to reach your Doctor call our office: [phone] Monday-Thursday 8:00 am - 4:00 PM    Fridays: Closed     In case of an emergency: In case of emergency, call 911 or go to the nearest emergency room and have the physician there call us.  Interpretation of Procedure Every nerve block has two components: a diagnostic component, and a treatment component. Unrealistic expectations are the most common causes of "perceived failure".  In a perfect world, a single nerve block should be able to completely and permanently eliminate the pain. Sadly, the world is not perfect.  Most pain management nerve blocks are performed using local anesthetics and steroids. Steroids are responsible for any long-term benefit that you may experience. Their purpose is to decrease any chronic swelling that may exist in the area. Steroids begin to work immediately after being injected. However, most patients will not experience any benefits until 5 to 10 days after the injection, when the swelling has come down to the point where they can tell a difference. Steroids will only help if there is swelling to be treated. As such, they can assist with the diagnosis. If effective, they suggest an inflammatory component to the pain, and if ineffective, they rule out inflammation as the main cause or component of the problem. If the problem is one of mechanical compression, you will get no benefit from those steroids.   In the case of local anesthetics, they have a crucial role in the diagnosis of your condition. Most will begin to work within15 to 20 minutes after injection. The duration will depend on the type used (short- vs. Long-acting). It is of outmost importance that patients keep tract of their pain, after the procedure. To assist with this matter, a "Post-procedure Pain Diary" is provided. Make sure to complete it and to bring it back to your  follow-up appointment.  As long as the patient keeps accurate, detailed records of their symptoms after every procedure, and returns to have those interpreted, every procedure will provide us with invaluable information. Even a block that does not provide the patient with any relief, will always provide us with information about the mechanism and the origin of the pain. The only time a nerve block can be considered a waste of time is when patients do not keep track of the results, or do not keep their post-procedure appointment.  Reporting the results back to your physician The Pain Score  Pain is a subjective complaint. It cannot be seen, touched, or measured. We depend entirely on the patient's report of the pain in order to assess your condition and treatment. To evaluate the pain, we use a pain scale, where "0" means "No Pain", and a "10" is "the worst possible pain that you can even imagine" (i.e. something like been eaten alive by a shark or being torn apart by a lion).   You will frequently be asked to rate your pain. Please be as accurate, remember that medical decisions will be based on your responses. Please do not rate your pain above a 10. Doing so is actually interpreted as "symptom magnification" (exaggeration), as   well as lack of understanding with regards to the scale. To put this into perspective, when you tell us that your pain is at a 10 (ten), what you are saying is that there is nothing we can do to make this pain any worse. (Carefully think about that.) Post-Procedure Pain Diary   Name: Date of Service Procedure      Time Period Pain Score Painful Area  Pre-procedure ____/10   Time Period Pain Score Area improved. Area not improved.  15 to 30 min post-procedure ____/10    1st hour after procedure ____/10    2nd hour after procedure ____/10    3rd hour after procedure ____/10    4th hour after procedure ____/10    5th hour after procedure ____/10    6th hour after procedure  ____/10    7th hour after procedure ____/10    Time Period Pain Score Area improved. Area not improved.  Note: From here on, always document your pain score 1st thing in the morning.  1st day after procedure ____/10    2nd day after procedure ____/10    3rd day after procedure ____/10    4th day after procedure ____/10    5th day after procedure ____/10    Time Period Pain Score Area improved. Area not improved.  10th day after procedure ____/10    20th day after procedure ____/10     Benefits Indicate for each set of activities if the procedure changes your ability to accomplish them.  Activity Worse No-Change Improved  Dressing, eating, walking, toileting, hygiene     Shopping, housekeeping, food preparation, community transportation     Range of motion of affected area      Your opinion Please indicate which statement best describes your impression of this treatment.  Statement (X)  Based on the results, I am encouraged.   Based on the results, I am disappointed.   I am not sure I have an opinion at this point.    Note: Make sure to complete and return this form to your physician, on your follow-up appointment. This information will be used to interpret the results. Failure to accurately complete, or to return this information, may result in less than optimal outcomes.

## 2016-10-20 NOTE — Progress Notes (Signed)
Safety precautions to be maintained throughout the outpatient stay will include: orient to surroundings, keep bed in low position, maintain call bell within reach at all times, provide assistance with transfer out of bed and ambulation.  

## 2016-10-20 NOTE — Progress Notes (Signed)
Patient's Name: Michele Moore  MRN: [id number]  Referring Provider: Marden Noble, MD  DOB: January 20, 1951  PCP: Marden Noble, MD  DOS: 10/20/2016  Note by: Gillis Santa, MD  Service setting: Ambulatory outpatient  Specialty: Interventional Pain Management  Patient type: Established  Location: [facility] (AMB) Pain Management Facility  Visit type: Interventional Procedure   Primary Reason for Visit: Interventional Pain Management Treatment. CC: Hip Pain (Bilateral )  Procedure:  Anesthesia, Analgesia, Anxiolysis:  Type: Diagnostic Sacroiliac Joint Steroid Injection Region: Superior Lumbosacral Region Level: PSIS (Posterior Superior Iliac Spine) Laterality: Bilateral  Type: Local Anesthesia with Moderate (Conscious) Sedation Local Anesthetic: Lidocaine 1% Route: Intravenous (IV) IV Access: Secured Sedation: Meaningful verbal contact was maintained at all times during the procedure  Indication(s): Analgesia and Anxiety  Indications: 1. SI joint arthritis ([place])   2. Lumbar radiculopathy    Pain Score: Pre-procedure: 8 /10 Post-procedure: 0-No pain/10  Pre-op Assessment:  Michele Moore is a 66 y.o. (year old), female patient, seen today for interventional treatment. She  has a past surgical history that includes Carpal tunnel release; Rotator cuff repair; Ankle surgery; Tubal ligation; Colonoscopy (2000?); Upper gi endoscopy (2000?); Joint replacement; Tonsillectomy; and Fusion of talonavicular joint (Right, 08/03/2015). Michele Moore has a current medication list which includes the following prescription(s): acetaminophen, acetaminophen, aspirin, duloxetine, econazole nitrate, fexofenadine-pseudoephedrine, gabapentin, hydralazine, influenza vac split quadrivalent pf, ketoconazole, levothyroxine, lidocaine, meloxicam, mupirocin ointment, nystatin cream, omega-3 acid ethyl esters, omeprazole, polyethyl glycol-propyl glycol, polyethylene glycol, potassium chloride sa, pregabalin, ranitidine, tizanidine, tramadol,  triamcinolone cream, triamterene-hydrochlorothiazide, aspirin ec, cefuroxime, chlorpheniramine-hydrocodone, enoxaparin, fexofenadine-pseudoephedrine, gabapentin, hydralazine, hydrocodone-acetaminophen, hydrocodone-acetaminophen, hydroxyzine, meloxicam, mupirocin cream, omega-3 acid ethyl esters, omeprazole, oxycodone-acetaminophen, oxycodone-acetaminophen, polyethylene glycol powder, potassium chloride sa, prednisone, ranitidine, synthroid, tizanidine, triamterene-hydrochlorothiazide, and trimethoprim-polymyxin b, and the following Facility-Administered Medications: fentanyl, lactated ringers, and sodium chloride flush. Her primarily concern today is the Hip Pain (Bilateral )  Initial Vital Signs: Blood pressure 130/78, pulse 62, temperature 98.4 F (36.9 C), temperature source Oral, resp. rate 16, height 5\' 11"  (1.803 m), weight 240 lb (108.9 kg), SpO2 98 %. BMI: Estimated body mass index is 33.47 kg/m as calculated from the following:   Height as of this encounter: 5\' 11"  (1.803 m).   Weight as of this encounter: 240 lb (108.9 kg).  Risk Assessment: Allergies: Reviewed. She is allergic to ampicillin; penicillins; and vibramycin [doxycycline calcium].  Allergy Precautions: None required Coagulopathies: Reviewed. None identified.  Blood-thinner therapy: None at this time Active Infection(s): Reviewed. None identified. Michele Moore is afebrile  Site Confirmation: Michele Moore was asked to confirm the procedure and laterality before marking the site Procedure checklist: Completed Consent: Before the procedure and under the influence of no sedative(s), amnesic(s), or anxiolytics, the patient was informed of the treatment options, risks and possible complications. To fulfill our ethical and legal obligations, as recommended by the American Medical Association's Code of Ethics, I have informed the patient of my clinical impression; the nature and purpose of the treatment or procedure; the risks, benefits, and  possible complications of the intervention; the alternatives, including doing nothing; the risk(s) and benefit(s) of the alternative treatment(s) or procedure(s); and the risk(s) and benefit(s) of doing nothing. The patient was provided information about the general risks and possible complications associated with the procedure. These may include, but are not limited to: failure to achieve desired goals, infection, bleeding, organ or nerve damage, allergic reactions, paralysis, and death. In addition, the patient was informed of those risks and complications associated to the procedure,  such as failure to decrease pain; infection; bleeding; organ or nerve damage with subsequent damage to sensory, motor, and/or autonomic systems, resulting in permanent pain, numbness, and/or weakness of one or several areas of the body; allergic reactions; (i.e.: anaphylactic reaction); and/or death. Furthermore, the patient was informed of those risks and complications associated with the medications. These include, but are not limited to: allergic reactions (i.e.: anaphylactic or anaphylactoid reaction(s)); adrenal axis suppression; blood sugar elevation that in diabetics may result in ketoacidosis or comma; water retention that in patients with history of congestive heart failure may result in shortness of breath, pulmonary edema, and decompensation with resultant heart failure; weight gain; swelling or edema; medication-induced neural toxicity; particulate matter embolism and blood vessel occlusion with resultant organ, and/or nervous system infarction; and/or aseptic necrosis of one or more joints. Finally, the patient was informed that Medicine is not an exact science; therefore, there is also the possibility of unforeseen or unpredictable risks and/or possible complications that may result in a catastrophic outcome. The patient indicated having understood very clearly. We have given the patient no guarantees and we have  made no promises. Enough time was given to the patient to ask questions, all of which were answered to the patient's satisfaction. Ms. Sampley has indicated that she wanted to continue with the procedure. Attestation: I, the ordering provider, attest that I have discussed with the patient the benefits, risks, side-effects, alternatives, likelihood of achieving goals, and potential problems during recovery for the procedure that I have provided informed consent. Date: 10/20/2016; Time: 9:58 AM  Pre-Procedure Preparation:  Monitoring: As per clinic protocol. Respiration, ETCO2, SpO2, BP, heart rate and rhythm monitor placed and checked for adequate function Safety Precautions: Patient was assessed for positional comfort and pressure points before starting the procedure. Time-out: I initiated and conducted the "Time-out" before starting the procedure, as per protocol. The patient was asked to participate by confirming the accuracy of the "Time Out" information. Verification of the correct person, site, and procedure were performed and confirmed by me, the nursing staff, and the patient. "Time-out" conducted as per Joint Commission's Universal Protocol (UP.01.01.01). "Time-out" Date & Time: 10/20/2016; 1024 hrs.  Description of Procedure Process:  Position: Prone Target Area: Superior, posterior, aspect of the sacroiliac fissure Approach: Posterior, paraspinal, ipsilateral approach. Area Prepped: Entire Lower Lumbosacral Region Prepping solution: ChloraPrep (2% chlorhexidine gluconate and 70% isopropyl alcohol) Safety Precautions: Aspiration looking for blood return was conducted prior to all injections. At no point did we inject any substances, as a needle was being advanced. No attempts were made at seeking any paresthesias. Safe injection practices and needle disposal techniques used. Medications properly checked for expiration dates. SDV (single dose vial) medications used. Description of the Procedure:  Protocol guidelines were followed. The patient was placed in position over the procedure table. The target area was identified and the area prepped in the usual manner. Skin & deeper tissues infiltrated with local anesthetic. Appropriate amount of time allowed to pass for local anesthetics to take effect. The procedure needle was advanced under fluoroscopic guidance into the sacroiliac joint until a firm endpoint was obtained. Proper needle placement secured. Negative aspiration confirmed. Solution injected in intermittent fashion, asking for systemic symptoms every 0.5cc of injectate. The needles were then removed and the area cleansed, making sure to leave some of the prepping solution back to take advantage of its long term bactericidal properties. Vitals:   10/20/16 1034 10/20/16 1042 10/20/16 1051 10/20/16 1100  BP: 137/71 (!) 113/103 128/73  123/64  Pulse: (!) 55 (!) 57 (!) 57 (!) 54  Resp: 12 13 15 14   Temp:  (!) 97.2 F (36.2 C)  97.6 F (36.4 C)  TempSrc:      SpO2: 99% 97% 98% 98%  Weight:      Height:        Start Time: 1024 hrs. End Time: 1033 hrs. Materials:  Needle(s) Type: Regular needle Gauge: 22G Length: 3.5-in Medication(s): We administered lidocaine (PF), ropivacaine (PF) 2 mg/mL (0.2%), triamcinolone acetonide, and fentaNYL. Please see chart orders for dosing details. 4 cc of 0.2% ropivacaine, 1 cc of 40 mg/cc of Kenalog; 2.5 cc in each joint.  Imaging Guidance (Non-Spinal):  Type of Imaging Technique: Fluoroscopy Guidance (Non-Spinal) Indication(s): Assistance in needle guidance and placement for procedures requiring needle placement in or near specific anatomical locations not easily accessible without such assistance. Exposure Time: Please see nurses notes. Contrast: Before injecting any contrast, we confirmed that the patient did not have an allergy to iodine, shellfish, or radiological contrast. Once satisfactory needle placement was completed at the desired level,  radiological contrast was injected. Contrast injected under live fluoroscopy. No contrast complications. See chart for type and volume of contrast used. Fluoroscopic Guidance: I was personally present during the use of fluoroscopy. "Tunnel Vision Technique" used to obtain the best possible view of the target area. Parallax error corrected before commencing the procedure. "Direction-depth-direction" technique used to introduce the needle under continuous pulsed fluoroscopy. Once target was reached, antero-posterior, oblique, and lateral fluoroscopic projection used confirm needle placement in all planes. Images permanently stored in EMR. Interpretation: I personally interpreted the imaging intraoperatively. Adequate needle placement confirmed in multiple planes. Appropriate spread of contrast into desired area was observed. No evidence of afferent or efferent intravascular uptake. Permanent images saved into the patient's record.  Antibiotic Prophylaxis:  Indication(s): None identified Antibiotic given: None  Post-operative Assessment:  EBL: None Complications: No immediate post-treatment complications observed by team, or reported by patient. Note: The patient tolerated the entire procedure well. A repeat set of vitals were taken after the procedure and the patient was kept under observation following institutional policy, for this type of procedure. Post-procedural neurological assessment was performed, showing return to baseline, prior to discharge. The patient was provided with post-procedure discharge instructions, including a section on how to identify potential problems. Should any problems arise concerning this procedure, the patient was given instructions to immediately contact us, at any time, without hesitation. In any case, we plan to contact the patient by telephone for a follow-up status report regarding this interventional procedure. Comments:  No additional relevant information.  Plan of  Care  Disposition: Discharge home  Discharge Date & Time: 10/20/2016; 1102 hrs.  5/5 strength b/l HIP flexion/extension  Physician-requested Follow-up:  Return in about 1 week (around 10/27/2016) for Medication Management, Post Procedure Evaluation. Patient to see pain psychology tomorrow. Have instructed her to ask them to send their evaluation.   Future Appointments Date Time Provider [place]  10/28/2016 10:00 AM Gillis Santa, MD [facility] None    Imaging Orders     DG C-Arm 1-60 Min-No Report Procedure Orders    No procedure(s) ordered today    Medications ordered for procedure: Meds ordered this encounter  Medications  . lidocaine (PF) (XYLOCAINE) 1 % injection 10 mL  . ropivacaine (PF) 2 mg/mL (0.2%) (NAROPIN) injection 4 mL  . triamcinolone acetonide (KENALOG-40) injection 40 mg  . sodium chloride flush (NS) 0.9 % injection 2 mL  .  fentaNYL (SUBLIMAZE) injection 25-50 mcg    Make sure Narcan is available in the pyxis when using this medication. In the event of respiratory depression (RR< 8/min): Titrate NARCAN (naloxone) in increments of 0.1 to 0.2 mg IV at 2-3 minute intervals, until desired degree of reversal.  . lactated ringers infusion 1,000 mL   Medications administered: We administered lidocaine (PF), ropivacaine (PF) 2 mg/mL (0.2%), triamcinolone acetonide, and fentaNYL.  See the medical record for exact dosing, route, and time of administration.  New Prescriptions   No medications on file   Primary Care Physician: Marden Noble, MD Location: [facility] Note by: Gillis Santa, MD Date: 10/20/2016; Time: 11:15 AM  Disclaimer:  Medicine is not an exact science. The only guarantee in medicine is that nothing is guaranteed. It is important to note that the decision to proceed with this intervention was based on the information collected from the patient. The Data and conclusions were drawn from the patient's questionnaire,  the interview, and the physical examination. Because the information was provided in large part by the patient, it cannot be guaranteed that it has not been purposely or unconsciously manipulated. Every effort has been made to obtain as much relevant data as possible for this evaluation. It is important to note that the conclusions that lead to this procedure are derived in large part from the available data. Always take into account that the treatment will also be dependent on availability of resources and existing treatment guidelines, considered by other Pain Management Practitioners as being common knowledge and practice, at the time of the intervention. For Medico-Legal purposes, it is also important to point out that variation in procedural techniques and pharmacological choices are the acceptable norm. The indications, contraindications, technique, and results of the above procedure should only be interpreted and judged by a Board-Certified Interventional Pain Specialist with extensive familiarity and expertise in the same exact procedure and technique.

## 2016-10-21 ENCOUNTER — Telehealth: Payer: Self-pay

## 2016-10-21 ENCOUNTER — Telehealth: Payer: Self-pay | Admitting: *Deleted

## 2016-10-21 DIAGNOSIS — F329 Major depressive disorder, single episode, unspecified: Secondary | ICD-10-CM | POA: Diagnosis not present

## 2016-10-21 NOTE — Telephone Encounter (Signed)
Left voicemail with patient to call our office with additional information re; her pain.

## 2016-10-21 NOTE — Telephone Encounter (Signed)
Post procedure phone call.  States she is doing [email or web address]   

## 2016-10-28 ENCOUNTER — Ambulatory Visit
Payer: Medicare Other | Attending: Student in an Organized Health Care Education/Training Program | Admitting: Student in an Organized Health Care Education/Training Program

## 2016-10-28 ENCOUNTER — Encounter: Payer: Self-pay | Admitting: Student in an Organized Health Care Education/Training Program

## 2016-10-28 VITALS — BP 129/57 | HR 59 | Temp 98.5°F | Resp 18 | Ht 71.0 in | Wt 240.0 lb

## 2016-10-28 DIAGNOSIS — M25552 Pain in left hip: Secondary | ICD-10-CM | POA: Diagnosis not present

## 2016-10-28 DIAGNOSIS — M47818 Spondylosis without myelopathy or radiculopathy, sacral and sacrococcygeal region: Secondary | ICD-10-CM

## 2016-10-28 DIAGNOSIS — G894 Chronic pain syndrome: Secondary | ICD-10-CM | POA: Diagnosis not present

## 2016-10-28 DIAGNOSIS — E079 Disorder of thyroid, unspecified: Secondary | ICD-10-CM | POA: Diagnosis not present

## 2016-10-28 DIAGNOSIS — G43909 Migraine, unspecified, not intractable, without status migrainosus: Secondary | ICD-10-CM | POA: Insufficient documentation

## 2016-10-28 DIAGNOSIS — I129 Hypertensive chronic kidney disease with stage 1 through stage 4 chronic kidney disease, or unspecified chronic kidney disease: Secondary | ICD-10-CM | POA: Insufficient documentation

## 2016-10-28 DIAGNOSIS — M48062 Spinal stenosis, lumbar region with neurogenic claudication: Secondary | ICD-10-CM | POA: Insufficient documentation

## 2016-10-28 DIAGNOSIS — M47896 Other spondylosis, lumbar region: Secondary | ICD-10-CM | POA: Diagnosis not present

## 2016-10-28 DIAGNOSIS — M533 Sacrococcygeal disorders, not elsewhere classified: Secondary | ICD-10-CM | POA: Diagnosis not present

## 2016-10-28 DIAGNOSIS — Z7982 Long term (current) use of aspirin: Secondary | ICD-10-CM | POA: Diagnosis not present

## 2016-10-28 DIAGNOSIS — M47816 Spondylosis without myelopathy or radiculopathy, lumbar region: Secondary | ICD-10-CM | POA: Diagnosis not present

## 2016-10-28 DIAGNOSIS — M5416 Radiculopathy, lumbar region: Secondary | ICD-10-CM

## 2016-10-28 DIAGNOSIS — K219 Gastro-esophageal reflux disease without esophagitis: Secondary | ICD-10-CM | POA: Insufficient documentation

## 2016-10-28 DIAGNOSIS — Z79899 Other long term (current) drug therapy: Secondary | ICD-10-CM | POA: Insufficient documentation

## 2016-10-28 DIAGNOSIS — Z87891 Personal history of nicotine dependence: Secondary | ICD-10-CM | POA: Insufficient documentation

## 2016-10-28 DIAGNOSIS — M4688 Other specified inflammatory spondylopathies, sacral and sacrococcygeal region: Secondary | ICD-10-CM | POA: Insufficient documentation

## 2016-10-28 DIAGNOSIS — E782 Mixed hyperlipidemia: Secondary | ICD-10-CM | POA: Diagnosis not present

## 2016-10-28 DIAGNOSIS — M4698 Unspecified inflammatory spondylopathy, sacral and sacrococcygeal region: Secondary | ICD-10-CM | POA: Diagnosis not present

## 2016-10-28 DIAGNOSIS — M069 Rheumatoid arthritis, unspecified: Secondary | ICD-10-CM | POA: Diagnosis not present

## 2016-10-28 DIAGNOSIS — M5116 Intervertebral disc disorders with radiculopathy, lumbar region: Secondary | ICD-10-CM | POA: Diagnosis not present

## 2016-10-28 DIAGNOSIS — M5136 Other intervertebral disc degeneration, lumbar region: Secondary | ICD-10-CM

## 2016-10-28 DIAGNOSIS — Z880 Allergy status to penicillin: Secondary | ICD-10-CM | POA: Insufficient documentation

## 2016-10-28 MED ORDER — OXYCODONE HCL 5 MG PO TABS: 5.0000 mg | ORAL_TABLET | Freq: Two times a day (BID) | ORAL | 0 refills | Status: DC | PRN

## 2016-10-28 NOTE — Progress Notes (Signed)
Safety precautions to be maintained throughout the outpatient stay will include: orient to surroundings, keep bed in low position, maintain call bell within reach at all times, provide assistance with transfer out of bed and ambulation.  

## 2016-10-28 NOTE — Progress Notes (Signed)
Patient's Name: Michele Moore  MRN: [id number]  Referring Provider: Marden Noble, MD  DOB: 22-Mar-1950  PCP: Marden Noble, MD  DOS: 10/28/2016  Note by: Gillis Santa, MD  Service setting: Ambulatory outpatient  Specialty: Interventional Pain Management  Location: [facility] (AMB) Pain Management Facility    Patient type: Established   Primary Reason(s) for Visit: Encounter for prescription drug management & post-procedure evaluation of chronic illness with mild to moderate exacerbation(Level of risk: moderate) CC: Back Pain (low) and Hip Pain (left)  HPI  Michele Moore is a 66 y.o. year old, female patient, who comes today for a post-procedure evaluation and medication management. She has Pain, lower extremity on her problem list. Her primarily concern today is the Back Pain (low) and Hip Pain (left)  Pain Assessment: Location: Lower Back Radiating: left hip Onset: More than a month ago Duration: Chronic pain Quality: Shooting, Stabbing Severity: 7 /10 (self-reported pain score)  Note: Reported level is compatible with observation.                   Effect on ADL:   Timing: Constant Modifying factors: procedure, medication, heat, rest  Michele Moore was last seen on 10/20/2016 for a procedure. During today's appointment we reviewed Michele Moore's post-procedure results, as well as her outpatient medication regimen.  Further details on both, my assessment(s), as well as the proposed treatment plan, please see below.  Controlled Substance Pharmacotherapy Assessment REMS (Risk Evaluation and Mitigation Strategy)  Analgesic: We'll start oxycodone 5 mg twice a day, quantity 60 per month MME/day: 15 mg/day.  Hart Rochester, RN  10/28/2016 10:25 AM  Sign at close encounter Safety precautions to be maintained throughout the outpatient stay will include: orient to surroundings, keep bed in low position, maintain call bell within reach at all times, provide assistance with transfer out of bed and ambulation.     Pharmacokinetics: Liberation and absorption (onset of action): WNL Distribution (time to peak effect): WNL Metabolism and excretion (duration of action): WNL         Pharmacodynamics: Desired effects: Analgesia: Michele Moore reports >50% benefit. Functional ability: Patient reports that medication allows her to accomplish basic ADLs Clinically meaningful improvement in function (CMIF): Sustained CMIF goals met Perceived effectiveness: Described as relatively effective, allowing for increase in activities of daily living (ADL) Undesirable effects: Side-effects or Adverse reactions: None reported Monitoring: [place] PMP: Online review of the past 73-monthperiod conducted. Compliant with practice rules and regulations List of all UDS test(s) done:  Lab Results  Component Value Date   SUMMARY FINAL 10/14/2016   Last UDS on record: Summary  Date Value Ref Range Status  10/14/2016 FINAL  Final    Comment:    ==================================================================== [facility] COMP DRUG ANALYSIS,UR ==================================================================== Test                             Result       Flag       Units Drug Present and Declared for Prescription Verification   Gabapentin                     PRESENT      EXPECTED   Acetaminophen                  PRESENT      EXPECTED Drug Present not Declared for Prescription Verification   Dextrorphan/Levorphanol  PRESENT      UNEXPECTED    Dextrorphan is an expected metabolite of dextromethorphan, an    over-the-counter or prescription cough suppressant. Levorphanol    is a scheduled prescription medication. Dextrorphan cannot be    distinguished from levorphanol by the method used for analysis. Drug Absent but Declared for Prescription Verification   Hydrocodone                    Not Detected UNEXPECTED ng/mg creat   Oxycodone                      Not Detected UNEXPECTED ng/mg creat   Ephedrine/Pseudoephedrine       Not Detected UNEXPECTED   Tramadol                       Not Detected UNEXPECTED   Pregabalin                     Not Detected UNEXPECTED   Tizanidine                     Not Detected UNEXPECTED    Tizanidine, as indicated in the declared medication list, is not    always detected even when used as directed.   Duloxetine                     Not Detected UNEXPECTED   Salicylate                     Not Detected UNEXPECTED    Aspirin, as indicated in the declared medication list, is not    always detected even when used as directed.   Chlorpheniramine               Not Detected UNEXPECTED   Hydroxyzine                    Not Detected UNEXPECTED   Lidocaine                      Not Detected UNEXPECTED    Lidocaine, as indicated in the declared medication list, is not    always detected even when used as directed. ==================================================================== Test                      Result    Flag   Units      Ref Range   Creatinine              101              mg/dL      >=20 ==================================================================== Declared Medications:  The flagging and interpretation on this report are based on the  following declared medications.  Unexpected results may arise from  inaccuracies in the declared medications.  **Note: The testing scope of this panel includes these medications:  Chlorpheniramine (Tussionex)  Duloxetine  Gabapentin  Hydrocodone (Hydrocodone-Acetaminophen)  Hydrocodone (Tussionex)  Hydroxyzine  Oxycodone (Oxycodone Acetaminophen)  Pregabalin  Pseudoephedrine (Fexofenadine Pseudoephedrine)  Tramadol  **Note: The testing scope of this panel does not include small to  moderate amounts of these reported medications:  Acetaminophen  Acetaminophen (Hydrocodone-Acetaminophen)  Acetaminophen (Oxycodone Acetaminophen)  Aspirin  Lidocaine  Tizanidine  **Note: The testing scope of this panel does not include  following  reported medications:  Cefuroxime axetil  Fexofenadine (Fexofenadine Pseudoephedrine)  Hydralazine  Hydrochlorothiazide (Triamterine-Hydrochlorthzide)  Ketoconazole  Levothyroxine  Meloxicam  Mupirocin  Nystatin  Omeprazole  Polyethylene Glycol  Polymyxin  Potassium  Prednisone  Ranitidine  Supplement (Omega-3)  Topical  Triamcinolone acetonide  Triamterene (Triamterine-Hydrochlorthzide)  Trimethoprim ==================================================================== For clinical consultation, please call [phone]. ====================================================================    UDS interpretation: Compliant          Medication Assessment Form: Reviewed. Patient indicates being compliant with therapy Treatment compliance: Compliant Risk Assessment Profile: Aberrant behavior: See prior evaluations. None observed or detected today Comorbid factors increasing risk of overdose: See prior notes. No additional risks detected today Risk of substance use disorder (SUD): Low     Opioid Risk Tool - 10/20/16 0943      Family History of Substance Abuse   Alcohol Negative   Illegal Drugs Negative   Rx Drugs Negative     Personal History of Substance Abuse   Alcohol Negative   Illegal Drugs Negative   Rx Drugs Negative     Psychological Disease   Psychological Disease Negative   Depression Negative     Total Score   Opioid Risk Tool Scoring 0   Opioid Risk Interpretation Low Risk     ORT Scoring interpretation table:  Score <3 = Low Risk for SUD  Score between 4-7 = Moderate Risk for SUD  Score >8 = High Risk for Opioid Abuse   Risk Mitigation Strategies:  Patient Counseling: Covered Patient-Prescriber Agreement (PPA): Present and active  Notification to other healthcare providers: Done  Pharmacologic Plan: Today we will take over the chronic pain medication management and from this point on our medication agreement with this patient is  active  Post-Procedure Assessment  10/20/2016 Procedure: Bilateral SI joint injection August 27 Pre-procedure pain score:  8/10 Post-procedure pain score: 0/10         Influential Factors: BMI: 33.47 kg/m Intra-procedural challenges: None observed.         Assessment challenges: None detected.              Reported side-effects: None.        Post-procedural adverse reactions or complications: None reported         Sedation: Please see nurses note. When no sedatives are used, the analgesic levels obtained are directly associated to the effectiveness of the local anesthetics. However, when sedation is provided, the level of analgesia obtained during the initial 1 hour following the intervention, is believed to be the result of a combination of factors. These factors may include, but are not limited to: 1. The effectiveness of the local anesthetics used. 2. The effects of the analgesic(s) and/or anxiolytic(s) used. 3. The degree of discomfort experienced by the patient at the time of the procedure. 4. The patients ability and reliability in recalling and recording the events. 5. The presence and influence of possible secondary gains and/or psychosocial factors. Reported result: Relief experienced during the 1st hour after the procedure: 100 % (Ultra-Short Term Relief)            Interpretative annotation: Clinically appropriate result. Analgesia during this period is likely to be Local Anesthetic and/or IV Sedative (Analgesic/Anxiolytic) related.          Effects of local anesthetic: The analgesic effects attained during this period are directly associated to the localized infiltration of local anesthetics and therefore cary significant diagnostic value as to the etiological location, or anatomical origin, of the pain. Expected duration of relief is directly dependent on the pharmacodynamics of the local anesthetic  used. Long-acting (4-6 hours) anesthetics used.  Reported result: Relief during  the next 4 to 6 hour after the procedure: 100 % (Short-Term Relief)            Interpretative annotation: Clinically appropriate result. Analgesia during this period is likely to be Local Anesthetic-related.          Long-term benefit: Defined as the period of time past the expected duration of local anesthetics (1 hour for short-acting and 4-6 hours for long-acting). With the possible exception of prolonged sympathetic blockade from the local anesthetics, benefits during this period are typically attributed to, or associated with, other factors such as analgesic sensory neuropraxia, antiinflammatory effects, or beneficial biochemical changes provided by agents other than the local anesthetics.  Reported result: Extended relief following procedure: 30 % (Long-Term Relief)            Interpretative annotation: Clinically appropriate result. Good relief. Therapeutic success. Inflammation plays a part in the etiology to the pain.          Current benefits: Defined as persistent relief that continues at this point in time.   Reported results: Treated area: 25 %       Interpretative annotation: Recurrence of symptoms. Limited therapeutic benefit. Effective diagnostic intervention.          Interpretation: Results would suggest a successful diagnostic intervention.                  Plan:  Please see "Plan of Care" for details.  Laboratory Chemistry  Inflammation Markers (CRP: Acute Phase) (ESR: Chronic Phase) No results found for: CRP, ESRSEDRATE               Renal Function Markers Lab Results  Component Value Date   BUN 33 (H) 08/05/2015   CREATININE 1.71 (H) 08/05/2015   GFRAA 35 (L) 08/05/2015   GFRNONAA 30 (L) 08/05/2015                 Hepatic Function Markers Lab Results  Component Value Date   AST 21 12/27/2013   ALT 19 12/27/2013   ALBUMIN 3.3 (L) 12/27/2013   ALKPHOS 91 12/27/2013                 Electrolytes Lab Results  Component Value Date   NA 141 08/05/2015   K 4.1  08/05/2015   CL 111 08/05/2015   CALCIUM 8.6 (L) 08/05/2015   MG 2.2 12/27/2013                 Neuropathy Markers No results found for: [id number]               Bone Pathology Markers Lab Results  Component Value Date   ALKPHOS 91 12/27/2013   CALCIUM 8.6 (L) 08/05/2015                 Coagulation Parameters Lab Results  Component Value Date   PLT 150 08/03/2015                 Cardiovascular Markers Lab Results  Component Value Date   HGB 10.6 (L) 08/03/2015   HCT 32.4 (L) 08/03/2015                 Note: Lab results reviewed.  Recent Diagnostic Imaging Review  Dg C-arm 1-60 Min-no Report  Result Date: 10/20/2016 Fluoroscopy was utilized by the requesting physician.  No radiographic interpretation.   Note: Imaging results reviewed.  Meds   Current Outpatient Prescriptions:  .  Acetaminophen (TYLENOL PO), Take 500 mg by mouth as needed., Disp: , Rfl:  .  acetaminophen (TYLENOL) 160 MG chewable tablet, Chew by mouth., Disp: , Rfl:  .  aspirin 81 MG tablet, Take 81 mg by mouth daily., Disp: , Rfl:  .  aspirin EC 81 MG tablet, Take by mouth., Disp: , Rfl:  .  cefUROXime (CEFTIN) 500 MG tablet, Take 1 tablet (500 mg total) by mouth 2 (two) times daily., Disp: 20 tablet, Rfl: 0 .  chlorpheniramine-HYDROcodone (TUSSIONEX PENNKINETIC ER) 10-8 MG/5ML SUER, Take 5 mLs by mouth every 12 (twelve) hours as needed for cough., Disp: 115 mL, Rfl: 0 .  DULoxetine (CYMBALTA) 30 MG capsule, TK ONE C PO D FOR NEUROPATHY, Disp: , Rfl:  .  econazole nitrate 1 % cream, Apply topically., Disp: , Rfl:  .  enoxaparin (LOVENOX) 40 MG/0.4ML injection, Inject 0.4 mLs (40 mg total) into the skin daily., Disp: 9 Syringe, Rfl: 0 .  fexofenadine-pseudoephedrine (ALLEGRA-D 24) 180-240 MG 24 hr tablet, Take by mouth., Disp: , Rfl:  .  fexofenadine-pseudoephedrine (ALLEGRA-D ALLERGY & CONGESTION) 180-240 MG 24 hr tablet, Take 1 tablet by mouth daily., Disp: 30 tablet, Rfl: 0 .  gabapentin  (NEURONTIN) 300 MG capsule, Take 600 mg by mouth at bedtime., Disp: , Rfl:  .  gabapentin (NEURONTIN) 300 MG capsule, TAKE 2 CAPSULES BY MOUTH NIGHTLY, Disp: , Rfl:  .  hydrALAZINE (APRESOLINE) 50 MG tablet, Take 50 mg by mouth 2 (two) times daily., Disp: , Rfl:  .  hydrALAZINE (APRESOLINE) 50 MG tablet, , Disp: , Rfl:  .  HYDROcodone-acetaminophen (NORCO/VICODIN) 5-325 MG tablet, Take 1-2 tablets by mouth every 6 (six) hours as needed for moderate pain., Disp: 40 tablet, Rfl: 0 .  HYDROcodone-acetaminophen (NORCO/VICODIN) 5-325 MG tablet, Take by mouth., Disp: , Rfl:  .  hydrOXYzine (ATARAX/VISTARIL) 25 MG tablet, Take 1 tablet (25 mg total) by mouth every 6 (six) hours as needed for itching., Disp: 30 tablet, Rfl: 0 .  Influenza vac split quadrivalent PF (FLUZONE HIGH-DOSE) 0.5 ML injection, ADM 0.5ML IM UTD, Disp: , Rfl:  .  ketoconazole (NIZORAL) 2 % cream, U UTD QHS, Disp: , Rfl:  .  levothyroxine (SYNTHROID) 150 MCG tablet, Take 200 mcg by mouth daily before breakfast. , Disp: , Rfl:  .  lidocaine (XYLOCAINE) 5 % ointment, , Disp: , Rfl:  .  meloxicam (MOBIC) 15 MG tablet, Take 1 tablet (15 mg total) by mouth daily., Disp: 30 tablet, Rfl: 0 .  meloxicam (MOBIC) 15 MG tablet, Take by mouth., Disp: , Rfl:  .  mupirocin cream (BACTROBAN) 2 %, Apply 1 application topically 2 (two) times daily., Disp: 15 g, Rfl: 0 .  mupirocin ointment (BACTROBAN) 2 %, Apply 1 application topically 3 (three) times daily., Disp: 22 g, Rfl: 0 .  nystatin cream (MYCOSTATIN), Apply topically., Disp: , Rfl:  .  omega-3 acid ethyl esters (LOVAZA) 1 G capsule, Take 2 g by mouth 2 (two) times daily. , Disp: , Rfl: 5 .  omega-3 acid ethyl esters (LOVAZA) 1 g capsule, , Disp: , Rfl:  .  omeprazole (PRILOSEC) 40 MG capsule, Take 40 mg by mouth daily. , Disp: , Rfl: 0 .  omeprazole (PRILOSEC) 40 MG capsule, TK ONE C PO QD, Disp: , Rfl:  .  oxyCODONE-acetaminophen (PERCOCET) 7.5-325 MG tablet, Take 1 tablet by mouth every 4  (four) hours as needed for severe pain., Disp: 20 tablet, Rfl: 0 .  oxyCODONE-acetaminophen (PERCOCET/ROXICET) 5-325 MG tablet, Take by mouth., Disp: , Rfl:  .  Polyethyl Glycol-Propyl Glycol (SYSTANE OP), Apply to eye., Disp: , Rfl:  .  polyethylene glycol (MIRALAX / GLYCOLAX) packet, Take 17 g by mouth daily., Disp: 14 each, Rfl: 0 .  polyethylene glycol powder (GLYCOLAX/MIRALAX) powder, 255 grams one bottle for colonoscopy prep, Disp: , Rfl:  .  potassium chloride SA (K-DUR,KLOR-CON) 20 MEQ tablet, Take 20-40 mEq by mouth daily. , Disp: , Rfl: 5 .  potassium chloride SA (K-DUR,KLOR-CON) 20 MEQ tablet, Take by mouth., Disp: , Rfl:  .  predniSONE (STERAPRED UNI-PAK 21 TAB) 10 MG (21) TBPK tablet, Sig 6 tablet day 1, 5 tablets day 2, 4 tablets day 3,,3tablets day 4, 2 tablets day 5, 1 tablet day 6 take all tablets orally, Disp: 21 tablet, Rfl: 0 .  pregabalin (LYRICA) 75 MG capsule, Start 1 capsule at night for 3-5 days, increasing by 1 capsule every 3-5 days as tolerated to target dose of 2 capsules twice daily., Disp: , Rfl:  .  ranitidine (ZANTAC) 150 MG tablet, Take 150 mg by mouth 2 (two) times daily. , Disp: , Rfl: 4 .  ranitidine (ZANTAC) 150 MG tablet, Take by mouth., Disp: , Rfl:  .  SYNTHROID 150 MCG tablet, Take 150 mcg by mouth daily before breakfast. , Disp: , Rfl: 5 .  tiZANidine (ZANAFLEX) 4 MG capsule, Take 1 capsule (4 mg total) by mouth 2 (two) times daily as needed., Disp: 60 capsule, Rfl: 2 .  tiZANidine (ZANAFLEX) 4 MG tablet, TAKE 1 TABLET(4 MG) BY MOUTH EVERY NIGHT AS NEEDED, Disp: , Rfl:  .  traMADol (ULTRAM) 50 MG tablet, Take 50 mg by mouth 2 (two) times daily., Disp: , Rfl:  .  triamcinolone cream (KENALOG) 0.5 %, Apply topically., Disp: , Rfl:  .  triamterene-hydrochlorothiazide (MAXZIDE) 75-50 MG per tablet, Take 1 tablet by mouth daily. , Disp: , Rfl: 4 .  triamterene-hydrochlorothiazide (MAXZIDE) 75-50 MG tablet, Take by mouth., Disp: , Rfl:  .  trimethoprim-polymyxin  b (POLYTRIM) ophthalmic solution, , Disp: , Rfl:  .  oxyCODONE (OXY IR/ROXICODONE) 5 MG immediate release tablet, Take 1 tablet (5 mg total) by mouth 2 (two) times daily as needed for severe pain., Disp: 60 tablet, Rfl: 0  ROS  Constitutional: Denies any fever or chills Gastrointestinal: No reported hemesis, hematochezia, vomiting, or acute GI distress Musculoskeletal: Denies any acute onset joint swelling, redness, loss of ROM, or weakness Neurological: No reported episodes of acute onset apraxia, aphasia, dysarthria, agnosia, amnesia, paralysis, loss of coordination, or loss of consciousness  Allergies  Michele Moore is allergic to ampicillin; penicillins; and vibramycin [doxycycline calcium].  PFSH  Drug: Michele Moore  reports that she does not use drugs. Alcohol:  reports that she does not drink alcohol. Tobacco:  reports that she quit smoking about 23 years ago. She has never used smokeless tobacco. Medical:  has a past medical history of Anemia; Arthritis; Chronic kidney disease; Family history of adverse reaction to anesthesia; GERD (gastroesophageal reflux disease); Hemorrhoid; History of hiatal hernia; Hypertension; Migraines; Mixed hyperlipidemia; Multiple gastric ulcers; and Thyroid disease. Surgical: Michele Moore  has a past surgical history that includes Carpal tunnel release; Rotator cuff repair; Ankle surgery; Tubal ligation; Colonoscopy (2000?); Upper gi endoscopy (2000?); Joint replacement; Tonsillectomy; and Fusion of talonavicular joint (Right, 08/03/2015). Family: family history includes COPD in her sister; Cancer in her maternal aunt and maternal uncle.  Constitutional Exam  General appearance: Well nourished, well developed, and  well hydrated. In no apparent acute distress Vitals:   10/28/16 1012  BP: (!) 129/57  Pulse: (!) 59  Resp: 18  Temp: 98.5 F (36.9 C)  TempSrc: Oral  SpO2: 98%  Weight: 240 lb (108.9 kg)  Height: _0  (1.803 m)   BMI Assessment: Estimated body mass  index is 33.47 kg/m as calculated from the following:   Height as of this encounter: _1  (1.803 m).   Weight as of this encounter: 240 lb (108.9 kg).  BMI interpretation table: BMI level Category Range association with higher incidence of chronic pain  <18 kg/m2 Underweight   18.5-24.9 kg/m2 Ideal body weight   25-29.9 kg/m2 Overweight Increased incidence by 20%  30-34.9 kg/m2 Obese (Class I) Increased incidence by 68%  35-39.9 kg/m2 Severe obesity (Class II) Increased incidence by 136%  >40 kg/m2 Extreme obesity (Class III) Increased incidence by 254%   BMI Readings from Last 4 Encounters:  10/28/16 33.47 kg/m  10/20/16 33.47 kg/m  10/14/16 33.47 kg/m  07/05/16 34.59 kg/m   Wt Readings from Last 4 Encounters:  10/28/16 240 lb (108.9 kg)  10/20/16 240 lb (108.9 kg)  10/14/16 240 lb (108.9 kg)  07/05/16 248 lb (112.5 kg)  Psych/Mental status: Alert, oriented x 3 (person, place, & time)       Eyes: PERLA Respiratory: No evidence of acute respiratory distress  Cervical Spine Area Exam  Skin & Axial Inspection: No masses, redness, edema, swelling, or associated skin lesions Alignment: Symmetrical Functional ROM: Decreased ROM      Stability: No instability detected Muscle Tone/Strength: Functionally intact. No obvious neuro-muscular anomalies detected. Sensory (Neurological): Movement-associated discomfort Palpation: Complains of area being tender to palpation             Tenderness to palpation overlying the left cervical facets, pain with cervical extension  Upper Extremity (UE) Exam    Side: Right upper extremity  Side: Left upper extremity  Skin & Extremity Inspection: Skin color, temperature, and hair growth are WNL. No peripheral edema or cyanosis. No masses, redness, swelling, asymmetry, or associated skin lesions. No contractures.  Skin & Extremity Inspection: Skin color, temperature, and hair growth are WNL. No peripheral edema or cyanosis. No masses, redness,  swelling, asymmetry, or associated skin lesions. No contractures.  Functional ROM: Unrestricted ROM          Functional ROM: Unrestricted ROM          Muscle Tone/Strength: Functionally intact. No obvious neuro-muscular anomalies detected.  Muscle Tone/Strength: Functionally intact. No obvious neuro-muscular anomalies detected.  Sensory (Neurological): Unimpaired          Sensory (Neurological): Unimpaired          Palpation: No palpable anomalies              Palpation: No palpable anomalies              Specialized Test(s): Deferred         Specialized Test(s): Deferred          Thoracic Spine Area Exam  Skin & Axial Inspection: No masses, redness, or swelling Alignment: Symmetrical Functional ROM: Unrestricted ROM Stability: No instability detected Muscle Tone/Strength: Functionally intact. No obvious neuro-muscular anomalies detected. Sensory (Neurological): Unimpaired Muscle strength & Tone: No palpable anomalies  Lumbar Spine Area Exam  Skin & Axial Inspection: No masses, redness, or swelling Alignment: Symmetrical Functional ROM: Decreased ROM      Stability: No instability detected Muscle Tone/Strength: Functionally intact. No obvious neuro-muscular anomalies detected.  Sensory (Neurological): Movement-associated discomfort Palpation: Complains of area being tender to palpation       Provocative Tests: Lumbar Hyperextension and rotation test: Positive       Lumbar Lateral bending test: Positive       Patrick's Maneuver: Positive, bilaterally, improved                         Gait & Posture Assessment  Ambulation: Unassisted Gait: Relatively normal for age and body habitus Posture: WNL   Lower Extremity Exam    Side: Right lower extremity  Side: Left lower extremity  Skin & Extremity Inspection: Skin color, temperature, and hair growth are WNL. No peripheral edema or cyanosis. No masses, redness, swelling, asymmetry, or associated skin lesions. No contractures.  Skin &  Extremity Inspection: Skin color, temperature, and hair growth are WNL. No peripheral edema or cyanosis. No masses, redness, swelling, asymmetry, or associated skin lesions. No contractures.  Functional ROM: Unrestricted ROM          Functional ROM: Unrestricted ROM          Muscle Tone/Strength: Functionally intact. No obvious neuro-muscular anomalies detected.  Muscle Tone/Strength: Functionally intact. No obvious neuro-muscular anomalies detected.  Sensory (Neurological): Unimpaired  Sensory (Neurological): Unimpaired  Palpation: No palpable anomalies  Palpation: No palpable anomalies   Assessment  Primary Diagnosis & Pertinent Problem List: The primary encounter diagnosis was SI joint arthritis ([place]). Diagnoses of Lumbar radiculopathy, Lumbar spondylosis, Spinal stenosis, lumbar region, with neurogenic claudication, Lumbar degenerative disc disease, Chronic pain syndrome, and SI (sacroiliac) joint dysfunction were also pertinent to this visit.  Status Diagnosis  Improving Improving Having a Flare-up 1. SI joint arthritis ([place])   2. Lumbar radiculopathy   3. Lumbar spondylosis   4. Spinal stenosis, lumbar region, with neurogenic claudication   5. Lumbar degenerative disc disease   6. Chronic pain syndrome   7. SI (sacroiliac) joint dysfunction       66 year old female with a past medical history of hypertension, migraines, osteoporosis, rheumatoid arthritis who has low back pain which radiates to the left leg. Patient's lumbar MRI is significant for severe lumbar spondylosis and degenerative disc disease causing prominent impingement at L5-S1, moderate impingement at L3-L4 and mild impingement at L4-L5, left greater than right.  Patient is status post bilateral sacroiliac joint injections on 10/20/2016 which provided the patient with significant pain relief for approximately a week and moderate pain relief ongoing. Patient has seen pain psychology, no concerns.  On exam today the  patient's SI joint exam, Patrick's test is improved. She has less tenderness with Patrick's maneuver bilaterally. Patient is complaining of left sided neck pain that radiates into her shoulder and also occipital headaches that radiate to her frontal lobe. Patient does have pain overlying her left cervical facets and pain with cervical extension which could be secondary to cervical facet disease. We discussed performing a diagnostic left C4, C5, C6, C7 medial branch nerve block and if effective, proceed with radiofrequency ablation. We can schedule this in the future as the patient is interested.  Continue medication management with gabapentin 600 mg daily at bedtime, Mobic 15 mg daily, tizanidine 4 mg twice a day when necessary. We will have the patient sign her opioid contract and initiate oxycodone at 5 mg twice a day when necessary.  Plan: -Continue gabapentin 600 mg daily at bedtime, Mobic 15 mg daily, tizanidine 4 mg twice a day when necessary. No refills necessary. -Good results from SI  joint injection, can repeat in 2 months if needed. -Have patient sign an opioid agreement at our clinic, oxycodone 5 mg twice a day when necessary, prescription for 1 month -At next visit, explore cervical facet disease further and consider left C4-C7 cervical medial branch blocks with goal radiofrequency ablation   Plan of Care  Pharmacotherapy (Medications Ordered): Meds ordered this encounter  Medications  . oxyCODONE (OXY IR/ROXICODONE) 5 MG immediate release tablet    Sig: Take 1 tablet (5 mg total) by mouth 2 (two) times daily as needed for severe pain.    Dispense:  60 tablet    Refill:  0    To last until: 11/27/16   Lab-work, procedure(s), and/or referral(s): No orders of the defined types were placed in this encounter.   Pharmacological management options:  Opioid Analgesics: We'll take over management today. See above orders Membrane stabilizer: To be determined at a later time currently on  gabapentin 600 mg daily at bedtime, can consider increase in the future or trialing Lyrica. Patient may also benefit from Topamax that she has recently been endorsing migraines.   Muscle relaxant: To be determined at a later time,  trial of tizanidine   NSAID: To be determined at a later time on Mobic 15 mg daily   Other analgesic(s): To be determined at a later time Voltaren GEL, TENS, SNRI, TENS        Considering:   -Left C4-C6 medial branch nerve block with radiofrequency ablation -Cervical trapezius trigger point injections -Repeat bilateral SI joint injection -Lumbar medial branch blocks with radiofrequency ablation -Genicular nerve blocks    PRN Procedures:   To be determined at a later time   Provider-requested follow-up: Return in about 3 weeks (around 11/18/2016) for Medication Management.  Future Appointments Date Time Provider [place]  11/18/2016 10:45 AM Gillis Santa, MD [facility] None    Primary Care Physician: Marden Noble, MD Location: [facility] Note by: Gillis Santa, M.D Date: 10/28/2016; Time: 11:13 AM  There are no Patient Instructions on file for this visit.

## 2016-10-29 NOTE — Telephone Encounter (Signed)
Left message for patient to return call.

## 2016-11-18 ENCOUNTER — Encounter: Payer: Self-pay | Admitting: Student in an Organized Health Care Education/Training Program

## 2016-11-18 ENCOUNTER — Ambulatory Visit
Payer: Medicare Other | Attending: Student in an Organized Health Care Education/Training Program | Admitting: Student in an Organized Health Care Education/Training Program

## 2016-11-18 VITALS — BP 125/65 | HR 57 | Temp 98.4°F | Resp 20 | Ht 71.0 in | Wt 240.0 lb

## 2016-11-18 DIAGNOSIS — Z7982 Long term (current) use of aspirin: Secondary | ICD-10-CM | POA: Diagnosis not present

## 2016-11-18 DIAGNOSIS — M533 Sacrococcygeal disorders, not elsewhere classified: Secondary | ICD-10-CM | POA: Diagnosis not present

## 2016-11-18 DIAGNOSIS — M4692 Unspecified inflammatory spondylopathy, cervical region: Secondary | ICD-10-CM | POA: Diagnosis not present

## 2016-11-18 DIAGNOSIS — Z9889 Other specified postprocedural states: Secondary | ICD-10-CM | POA: Diagnosis not present

## 2016-11-18 DIAGNOSIS — Z981 Arthrodesis status: Secondary | ICD-10-CM | POA: Insufficient documentation

## 2016-11-18 DIAGNOSIS — M47812 Spondylosis without myelopathy or radiculopathy, cervical region: Secondary | ICD-10-CM | POA: Insufficient documentation

## 2016-11-18 DIAGNOSIS — Z881 Allergy status to other antibiotic agents status: Secondary | ICD-10-CM | POA: Insufficient documentation

## 2016-11-18 DIAGNOSIS — E079 Disorder of thyroid, unspecified: Secondary | ICD-10-CM | POA: Insufficient documentation

## 2016-11-18 DIAGNOSIS — Z87891 Personal history of nicotine dependence: Secondary | ICD-10-CM | POA: Insufficient documentation

## 2016-11-18 DIAGNOSIS — I129 Hypertensive chronic kidney disease with stage 1 through stage 4 chronic kidney disease, or unspecified chronic kidney disease: Secondary | ICD-10-CM | POA: Diagnosis not present

## 2016-11-18 DIAGNOSIS — N189 Chronic kidney disease, unspecified: Secondary | ICD-10-CM | POA: Insufficient documentation

## 2016-11-18 DIAGNOSIS — M069 Rheumatoid arthritis, unspecified: Secondary | ICD-10-CM | POA: Insufficient documentation

## 2016-11-18 DIAGNOSIS — E782 Mixed hyperlipidemia: Secondary | ICD-10-CM | POA: Diagnosis not present

## 2016-11-18 DIAGNOSIS — K219 Gastro-esophageal reflux disease without esophagitis: Secondary | ICD-10-CM | POA: Diagnosis not present

## 2016-11-18 DIAGNOSIS — Z836 Family history of other diseases of the respiratory system: Secondary | ICD-10-CM | POA: Insufficient documentation

## 2016-11-18 DIAGNOSIS — Z9660 Presence of unspecified orthopedic joint implant: Secondary | ICD-10-CM | POA: Insufficient documentation

## 2016-11-18 DIAGNOSIS — G43909 Migraine, unspecified, not intractable, without status migrainosus: Secondary | ICD-10-CM | POA: Insufficient documentation

## 2016-11-18 DIAGNOSIS — Z880 Allergy status to penicillin: Secondary | ICD-10-CM | POA: Diagnosis not present

## 2016-11-18 DIAGNOSIS — M542 Cervicalgia: Secondary | ICD-10-CM | POA: Insufficient documentation

## 2016-11-18 DIAGNOSIS — Z9851 Tubal ligation status: Secondary | ICD-10-CM | POA: Insufficient documentation

## 2016-11-18 DIAGNOSIS — G894 Chronic pain syndrome: Secondary | ICD-10-CM | POA: Insufficient documentation

## 2016-11-18 DIAGNOSIS — Z809 Family history of malignant neoplasm, unspecified: Secondary | ICD-10-CM | POA: Insufficient documentation

## 2016-11-18 DIAGNOSIS — M549 Dorsalgia, unspecified: Secondary | ICD-10-CM | POA: Diagnosis not present

## 2016-11-18 DIAGNOSIS — Z79891 Long term (current) use of opiate analgesic: Secondary | ICD-10-CM | POA: Diagnosis not present

## 2016-11-18 DIAGNOSIS — Z8711 Personal history of peptic ulcer disease: Secondary | ICD-10-CM | POA: Diagnosis not present

## 2016-11-18 MED ORDER — HYDROCODONE-ACETAMINOPHEN 7.5-325 MG PO TABS: 1.0000 | ORAL_TABLET | Freq: Two times a day (BID) | ORAL | 0 refills | Status: DC | PRN

## 2016-11-18 MED ORDER — HYDROCODONE-ACETAMINOPHEN 7.5-325 MG PO TABS: 1.0000 | ORAL_TABLET | Freq: Four times a day (QID) | ORAL | 0 refills | Status: DC | PRN

## 2016-11-18 NOTE — Patient Instructions (Addendum)
.  You have been handed 2 prescriptions for Hydrocodone today and been given pre procedure instructions for your upcoming procedure with sedation.     Prescription for hydrocodone 7.5 mg twice daily as needed for severe pain. Prescription provided for 2 months. 2. Plan for left cervical facet injections for your left neck pain and headaches.       Preparing for Procedure with Sedation Instructions: . Oral Intake: Do not eat or drink anything for at least 8 hours prior to your procedure. . Transportation: Public transportation is not allowed. Bring an adult driver. The driver must be physically present in our waiting room before any procedure can be started. Mar[place] Kitchen Physical Assistance: Bring an adult capable of physically assisting you, in the event you need help. . Blood Pressure Medicine: Take your blood pressure medicine with a sip of water the morning of the procedure. . Insulin: Take only  of your normal insulin dose. . Preventing infections: Shower with an antibacterial soap the morning of your procedure. . Build-up your immune system: Take 1000 mg of Vitamin C with every meal (3 times a day) the day prior to your procedure. . Pregnancy: If you are pregnant, call and cancel the procedure. . Sickness: If you have a cold, fever, or any active infections, call and cancel the procedure. . Arrival: You must be in the facility at least 30 minutes prior to your scheduled procedure. . Children: Do not bring children with you. . Dress appropriately: Bring dark clothing that you would not mind if they get stained. . Valuables: Do not bring any jewelry or valuables. Procedure appointments are reserved for interventional treatments only. Marland Kitchen No Prescription Refills. . No medication changes will be discussed during procedure appointments. No disability issues will be discussed.

## 2016-11-18 NOTE — Progress Notes (Signed)
Patient's Name: Michele Moore  MRN: [id number]  Referring Provider: Marden Noble, MD  DOB: [birth year]  PCP: Marden Noble, MD  DOS: 11/18/2016  Note by: Gillis Santa, MD  Service setting: Ambulatory outpatient  Specialty: Interventional Pain Management  Location: [facility] (AMB) Pain Management Facility    Patient type: Established   Primary Reason(s) for Visit: Encounter for prescription drug management. (Level of risk: moderate)  CC: Back Pain (low)  HPI  Michele Moore is a 66 y.o. year old, female patient, who comes today for a medication management evaluation. She has Pain, lower extremity on her problem list. Her primarily concern today is the Back Pain (low)  Pain Assessment: Location: Lower, Upper Back Radiating: radiates into left hip Onset: More than a month ago Duration: Chronic pain Quality: Shooting, Stabbing Severity: 7 /10 (self-reported pain score)  Note: Reported level is compatible with observation.                   When using our objective Pain Scale, any level above a 5/10 is said to belong in an emergency room, as it progressively worsens from a 6/10, which is described as severely limiting. Requiring emergency care not usually available at an outpatient pain management facility. Communication becomes difficult and requires great effort. Assistance to reach the emergency department may be required. Facial flushing and profuse sweating along with potentially dangerous increases in heart rate and blood pressure will be evident. Effect on ADL:   Timing: Constant Modifying factors: meds, heat  Michele Moore was last scheduled for an appointment on 10/28/2016 for medication management. During today's appointment we reviewed Michele Moore's chronic pain status, as well as her outpatient medication regimen.  66 year old female with a past medical history of hypertension, migraines, osteoporosis, rheumatoid arthritis who has low back pain which radiates to the left leg.Patient's lumbar MRI is  significant for severe lumbar spondylosis and degenerative disc disease causing prominent impingement at L5-S1, moderate impingement at L3-L4 and mild impingement at L4-L5, left greater than right.Patient is status post bilateral sacroiliac joint injections on 10/20/2016 which provided the patient with significant pain relief for approximately a week and moderate pain relief ongoing. Patient has seen pain psychology, no concerns.  Patient complaining of left sided neck pain that radiates into her left shoulder and left scapular region. Describes pain as shooting, stabbing, throbbing will The patient  reports that she does not use drugs. Her body mass index is 33.47 kg/m.  Further details on both, my assessment(s), as well as the proposed treatment plan, please see below.  Controlled Substance Pharmacotherapy Assessment REMS (Risk Evaluation and Mitigation Strategy)  Analgesic: 10/28/2016 1 10/28/2016 OXYCODONE HCL 5 MG TABLET 60 30 Bi Lat [id number] MMN(8177) 0 15.00 MME Comm Ins [place]  Patient also filled Tramadol that was written on 5/1, 50 mg, # 100. I reminded patient that all opioid medications need to come from this clinic unless patient dealing with acute pain issue in which case the patient is to notify us if she gets opioid therapy for acute pain. Patient states she understands policy. MME/day: approximately 15 mg/day.   Dewayne Shorter, RN  11/18/2016 11:08 AM  Signed Nursing Pain Medication Assessment:  Safety precautions to be maintained throughout the outpatient stay will include: orient to surroundings, keep bed in low position, maintain call bell within reach at all times, provide assistance with transfer out of bed and ambulation.  Medication Inspection Compliance: Pill count conducted under aseptic conditions, in front of the  patient. Neither the pills nor the bottle was removed from the patient's sight at any time. Once count was completed pills were immediately returned to the patient in  their original bottle.  Medication: Oxycodone IR Pill/Patch Count: 5 of 60 pills remain Pill/Patch Appearance: Markings consistent with prescribed medication Bottle Appearance: Standard pharmacy container. Clearly labeled. Filled Date09/ 04 / 2018 Last Medication intake:  Today   Pharmacokinetics: Liberation and absorption (onset of action): WNL Distribution (time to peak effect): WNL Metabolism and excretion (duration of action): WNL         Pharmacodynamics: Desired effects: Analgesia: Michele Moore reports >50% benefit. Functional ability: Patient reports that medication allows her to accomplish basic ADLs Clinically meaningful improvement in function (CMIF): Sustained CMIF goals met Perceived effectiveness: Described as relatively effective, allowing for increase in activities of daily living (ADL) Undesirable effects: Side-effects or Adverse reactions: None reported Monitoring: [place] PMP: Online review of the past 38-monthperiod conducted. Compliant with practice rules and regulations Last UDS on record: Summary  Date Value Ref Range Status  10/14/2016 FINAL  Final    Comment:    ==================================================================== TOXAS[facility]RE COMP DRUG ANALYSIS,UR ==================================================================== Test                             Result       Flag       Units Drug Present and Declared for Prescription Verification   Gabapentin                     PRESENT      EXPECTED   Acetaminophen                  PRESENT      EXPECTED Drug Present not Declared for Prescription Verification   Dextrorphan/Levorphanol        PRESENT      UNEXPECTED    Dextrorphan is an expected metabolite of dextromethorphan, an    over-the-counter or prescription cough suppressant. Levorphanol    is a scheduled prescription medication. Dextrorphan cannot be    distinguished from levorphanol by the method used for analysis. Drug Absent but Declared for  Prescription Verification   Hydrocodone                    Not Detected UNEXPECTED ng/mg creat   Oxycodone                      Not Detected UNEXPECTED ng/mg creat   Ephedrine/Pseudoephedrine      Not Detected UNEXPECTED   Tramadol                       Not Detected UNEXPECTED   Pregabalin                     Not Detected UNEXPECTED   Tizanidine                     Not Detected UNEXPECTED    Tizanidine, as indicated in the declared medication list, is not    always detected even when used as directed.   Duloxetine                     Not Detected UNEXPECTED   Salicylate  Not Detected UNEXPECTED    Aspirin, as indicated in the declared medication list, is not    always detected even when used as directed.   Chlorpheniramine               Not Detected UNEXPECTED   Hydroxyzine                    Not Detected UNEXPECTED   Lidocaine                      Not Detected UNEXPECTED    Lidocaine, as indicated in the declared medication list, is not    always detected even when used as directed. ==================================================================== Test                      Result    Flag   Units      Ref Range   Creatinine              101              mg/dL      >=20 ==================================================================== Declared Medications:  The flagging and interpretation on this report are based on the  following declared medications.  Unexpected results may arise from  inaccuracies in the declared medications.  **Note: The testing scope of this panel includes these medications:  Chlorpheniramine (Tussionex)  Duloxetine  Gabapentin  Hydrocodone (Hydrocodone-Acetaminophen)  Hydrocodone (Tussionex)  Hydroxyzine  Oxycodone (Oxycodone Acetaminophen)  Pregabalin  Pseudoephedrine (Fexofenadine Pseudoephedrine)  Tramadol  **Note: The testing scope of this panel does not include small to  moderate amounts of these reported medications:   Acetaminophen  Acetaminophen (Hydrocodone-Acetaminophen)  Acetaminophen (Oxycodone Acetaminophen)  Aspirin  Lidocaine  Tizanidine  **Note: The testing scope of this panel does not include following  reported medications:  Cefuroxime axetil  Fexofenadine (Fexofenadine Pseudoephedrine)  Hydralazine  Hydrochlorothiazide (Triamterine-Hydrochlorthzide)  Ketoconazole  Levothyroxine  Meloxicam  Mupirocin  Nystatin  Omeprazole  Polyethylene Glycol  Polymyxin  Potassium  Prednisone  Ranitidine  Supplement (Omega-3)  Topical  Triamcinolone acetonide  Triamterene (Triamterine-Hydrochlorthzide)  Trimethoprim ==================================================================== For clinical consultation, please call [phone]. ====================================================================    UDS interpretation: Compliant          Medication Assessment Form: Reviewed. Patient indicates being compliant with therapy Treatment compliance: Compliant Risk Assessment Profile: Aberrant behavior: See prior evaluations. None observed or detected today Comorbid factors increasing risk of overdose: See prior notes. No additional risks detected today Risk of substance use disorder (SUD): Low     Opioid Risk Tool - 10/20/16 0943      Family History of Substance Abuse   Alcohol Negative   Illegal Drugs Negative   Rx Drugs Negative     Personal History of Substance Abuse   Alcohol Negative   Illegal Drugs Negative   Rx Drugs Negative     Psychological Disease   Psychological Disease Negative   Depression Negative     Total Score   Opioid Risk Tool Scoring 0   Opioid Risk Interpretation Low Risk     ORT Scoring interpretation table:  Score <3 = Low Risk for SUD  Score between 4-7 = Moderate Risk for SUD  Score >8 = High Risk for Opioid Abuse   Risk Mitigation Strategies:  Patient Counseling: Covered Patient-Prescriber Agreement (PPA): Present and active   Notification to other healthcare providers: Done  Pharmacologic Plan: Therapy adjustment: changed to hydrocodone 7.5 mg, quantity 60 ,twice a day when  necessary  Laboratory Chemistry  Inflammation Markers (CRP: Acute Phase) (ESR: Chronic Phase) No results found for: CRP, ESRSEDRATE               Renal Function Markers Lab Results  Component Value Date   BUN 33 (H) 08/05/2015   CREATININE 1.71 (H) 08/05/2015   GFRAA 35 (L) 08/05/2015   GFRNONAA 30 (L) 08/05/2015                 Hepatic Function Markers Lab Results  Component Value Date   AST 21 12/27/2013   ALT 19 12/27/2013   ALBUMIN 3.3 (L) 12/27/2013   ALKPHOS 91 12/27/2013                 Electrolytes Lab Results  Component Value Date   NA 141 08/05/2015   K 4.1 08/05/2015   CL 111 08/05/2015   CALCIUM 8.6 (L) 08/05/2015   MG 2.2 12/27/2013                 Neuropathy Markers No results found for: [id number]               Bone Pathology Markers Lab Results  Component Value Date   ALKPHOS 91 12/27/2013   CALCIUM 8.6 (L) 08/05/2015                 Coagulation Parameters Lab Results  Component Value Date   PLT 150 08/03/2015                 Cardiovascular Markers Lab Results  Component Value Date   HGB 10.6 (L) 08/03/2015   HCT 32.4 (L) 08/03/2015                 Note: Lab results reviewed.  Recent Diagnostic Imaging Results  DG C-Arm 1-60 Min-No Report Fluoroscopy was utilized by the requesting physician.  No radiographic  interpretation.   Note: Imaging results reviewed.          Meds   Current Outpatient Prescriptions:  .  aspirin 81 MG tablet, Take 81 mg by mouth daily., Disp: , Rfl:  .  chlorpheniramine-HYDROcodone (TUSSIONEX PENNKINETIC ER) 10-8 MG/5ML SUER, Take 5 mLs by mouth every 12 (twelve) hours as needed for cough., Disp: 115 mL, Rfl: 0 .  DULoxetine (CYMBALTA) 30 MG capsule, TK ONE C PO D FOR NEUROPATHY, Disp: , Rfl:  .  econazole nitrate 1 % cream, Apply topically., Disp: ,  Rfl:  .  fexofenadine-pseudoephedrine (ALLEGRA-D ALLERGY & CONGESTION) 180-240 MG 24 hr tablet, Take 1 tablet by mouth daily., Disp: 30 tablet, Rfl: 0 .  gabapentin (NEURONTIN) 300 MG capsule, Take 600 mg by mouth at bedtime., Disp: , Rfl:  .  hydrALAZINE (APRESOLINE) 50 MG tablet, Take 50 mg by mouth 2 (two) times daily., Disp: , Rfl:  .  hydrOXYzine (ATARAX/VISTARIL) 25 MG tablet, Take 1 tablet (25 mg total) by mouth every 6 (six) hours as needed for itching., Disp: 30 tablet, Rfl: 0 .  ketoconazole (NIZORAL) 2 % cream, U UTD QHS, Disp: , Rfl:  .  levothyroxine (SYNTHROID) 150 MCG tablet, Take 200 mcg by mouth daily before breakfast. , Disp: , Rfl:  .  meloxicam (MOBIC) 15 MG tablet, Take 1 tablet (15 mg total) by mouth daily., Disp: 30 tablet, Rfl: 0 .  mupirocin ointment (BACTROBAN) 2 %, Apply 1 application topically 3 (three) times daily., Disp: 22 g, Rfl: 0 .  nystatin cream (MYCOSTATIN), Apply topically., Disp: , Rfl:  .  omega-3 acid ethyl esters (LOVAZA) 1 G capsule, Take 2 g by mouth 2 (two) times daily. , Disp: , Rfl: 5 .  omeprazole (PRILOSEC) 40 MG capsule, Take 40 mg by mouth daily. , Disp: , Rfl: 0 .  Polyethyl Glycol-Propyl Glycol (SYSTANE OP), Apply to eye., Disp: , Rfl:  .  potassium chloride SA (K-DUR,KLOR-CON) 20 MEQ tablet, Take 20-40 mEq by mouth daily. , Disp: , Rfl: 5 .  pregabalin (LYRICA) 75 MG capsule, Start 1 capsule at night for 3-5 days, increasing by 1 capsule every 3-5 days as tolerated to target dose of 2 capsules twice daily., Disp: , Rfl:  .  ranitidine (ZANTAC) 150 MG tablet, Take 150 mg by mouth 2 (two) times daily. , Disp: , Rfl: 4 .  SYNTHROID 150 MCG tablet, Take 150 mcg by mouth daily before breakfast. , Disp: , Rfl: 5 .  tiZANidine (ZANAFLEX) 4 MG capsule, Take 1 capsule (4 mg total) by mouth 2 (two) times daily as needed., Disp: 60 capsule, Rfl: 2 .  traMADol (ULTRAM) 50 MG tablet, Take 50 mg by mouth 2 (two) times daily., Disp: , Rfl:  .  triamcinolone  cream (KENALOG) 0.5 %, Apply topically., Disp: , Rfl:  .  triamterene-hydrochlorothiazide (MAXZIDE) 75-50 MG per tablet, Take 1 tablet by mouth daily. , Disp: , Rfl: 4 .  aspirin EC 81 MG tablet, Take by mouth., Disp: , Rfl:  .  cefUROXime (CEFTIN) 500 MG tablet, Take 1 tablet (500 mg total) by mouth 2 (two) times daily. (Patient not taking: Reported on 11/18/2016), Disp: 20 tablet, Rfl: 0 .  enoxaparin (LOVENOX) 40 MG/0.4ML injection, Inject 0.4 mLs (40 mg total) into the skin daily. (Patient not taking: Reported on 11/18/2016), Disp: 9 Syringe, Rfl: 0 .  fexofenadine-pseudoephedrine (ALLEGRA-D 24) 180-240 MG 24 hr tablet, Take by mouth., Disp: , Rfl:  .  gabapentin (NEURONTIN) 300 MG capsule, TAKE 2 CAPSULES BY MOUTH NIGHTLY, Disp: , Rfl:  .  hydrALAZINE (APRESOLINE) 50 MG tablet, , Disp: , Rfl:  .  HYDROcodone-acetaminophen (NORCO) 7.5-325 MG tablet, Take 1 tablet by mouth 2 (two) times daily as needed for severe pain. For Chronic Pain DNF 11/26/16, 12/26/16, Disp: 60 tablet, Rfl: 0 .  Influenza vac split quadrivalent PF (FLUZONE HIGH-DOSE) 0.5 ML injection, ADM 0.5ML IM UTD, Disp: , Rfl:  .  lidocaine (XYLOCAINE) 5 % ointment, , Disp: , Rfl:  .  meloxicam (MOBIC) 15 MG tablet, Take by mouth., Disp: , Rfl:  .  mupirocin cream (BACTROBAN) 2 %, Apply 1 application topically 2 (two) times daily. (Patient not taking: Reported on 11/18/2016), Disp: 15 g, Rfl: 0 .  omega-3 acid ethyl esters (LOVAZA) 1 g capsule, , Disp: , Rfl:  .  omeprazole (PRILOSEC) 40 MG capsule, TK ONE C PO QD, Disp: , Rfl:  .  polyethylene glycol (MIRALAX / GLYCOLAX) packet, Take 17 g by mouth daily. (Patient not taking: Reported on 11/18/2016), Disp: 14 each, Rfl: 0 .  polyethylene glycol powder (GLYCOLAX/MIRALAX) powder, 255 grams one bottle for colonoscopy prep, Disp: , Rfl:  .  potassium chloride SA (K-DUR,KLOR-CON) 20 MEQ tablet, Take by mouth., Disp: , Rfl:  .  predniSONE (STERAPRED UNI-PAK 21 TAB) 10 MG (21) TBPK tablet, Sig 6  tablet day 1, 5 tablets day 2, 4 tablets day 3,,3tablets day 4, 2 tablets day 5, 1 tablet day 6 take all tablets orally (Patient not taking: Reported on 11/18/2016), Disp: 21 tablet, Rfl: 0 .  ranitidine (ZANTAC) 150 MG tablet, Take  by mouth., Disp: , Rfl:  .  tiZANidine (ZANAFLEX) 4 MG tablet, TAKE 1 TABLET(4 MG) BY MOUTH EVERY NIGHT AS NEEDED, Disp: , Rfl:  .  triamterene-hydrochlorothiazide (MAXZIDE) 75-50 MG tablet, Take by mouth., Disp: , Rfl:  .  trimethoprim-polymyxin b (POLYTRIM) ophthalmic solution, , Disp: , Rfl:   ROS  Constitutional: Denies any fever or chills Gastrointestinal: No reported hemesis, hematochezia, vomiting, or acute GI distress Musculoskeletal: Denies any acute onset joint swelling, redness, loss of ROM, or weakness Neurological: No reported episodes of acute onset apraxia, aphasia, dysarthria, agnosia, amnesia, paralysis, loss of coordination, or loss of consciousness  Allergies  Michele Moore is allergic to ampicillin; penicillins; and vibramycin [doxycycline calcium].  PFSH  Drug: Michele Moore  reports that she does not use drugs. Alcohol:  reports that she does not drink alcohol. Tobacco:  reports that she quit smoking about 23 years ago. She has never used smokeless tobacco. Medical:  has a past medical history of Anemia; Arthritis; Chronic kidney disease; Family history of adverse reaction to anesthesia; GERD (gastroesophageal reflux disease); Hemorrhoid; History of hiatal hernia; Hypertension; Migraines; Mixed hyperlipidemia; Multiple gastric ulcers; and Thyroid disease. Surgical: Michele Moore  has a past surgical history that includes Carpal tunnel release; Rotator cuff repair; Ankle surgery; Tubal ligation; Colonoscopy (2000?); Upper gi endoscopy (2000?); Joint replacement; Tonsillectomy; and Fusion of talonavicular joint (Right, 08/03/2015). Family: family history includes COPD in her sister; Cancer in her maternal aunt and maternal uncle.  Constitutional Exam  General  appearance: Well nourished, well developed, and well hydrated. In no apparent acute distress Vitals:   11/18/16 1058 11/18/16 1059  BP:  125/65  Pulse:  (!) 57  Resp:  20  Temp:  98.4 F (36.9 C)  SpO2:  99%  Weight: 240 lb (108.9 kg)   Height: 5' 11" (1.803 m)    BMI Assessment: Estimated body mass index is 33.47 kg/m as calculated from the following:   Height as of this encounter: 5' 11" (1.803 m).   Weight as of this encounter: 240 lb (108.9 kg).  BMI interpretation table: BMI level Category Range association with higher incidence of chronic pain  <18 kg/m2 Underweight   18.5-24.9 kg/m2 Ideal body weight   25-29.9 kg/m2 Overweight Increased incidence by 20%  30-34.9 kg/m2 Obese (Class I) Increased incidence by 68%  35-39.9 kg/m2 Severe obesity (Class II) Increased incidence by 136%  >40 kg/m2 Extreme obesity (Class III) Increased incidence by 254%   BMI Readings from Last 4 Encounters:  11/18/16 33.47 kg/m  10/28/16 33.47 kg/m  10/20/16 33.47 kg/m  10/14/16 33.47 kg/m   Wt Readings from Last 4 Encounters:  11/18/16 240 lb (108.9 kg)  10/28/16 240 lb (108.9 kg)  10/20/16 240 lb (108.9 kg)  10/14/16 240 lb (108.9 kg)  Psych/Mental status: Alert, oriented x 3 (person, place, & time)       Eyes: PERLA Respiratory: No evidence of acute respiratory distress  Cervical Spine Area Exam  Skin & Axial Inspection: No masses, redness, edema, swelling, or associated skin lesions Alignment: Symmetrical Functional ROM: Decreased ROM      Stability: No instability detected Muscle Tone/Strength: Functionally intact. No obvious neuro-muscular anomalies detected. Sensory (Neurological): Movement-associated discomfort Palpation: Tender Positive provocative maneuver for for cervical facet disease  Positive pain with cervical extension, pain overlying the left cervical facets, left shoulder region left periscapular region.  Upper Extremity (UE) Exam    Side: Right upper extremity   Side: Left upper extremity  Skin & Extremity Inspection:  Skin color, temperature, and hair growth are WNL. No peripheral edema or cyanosis. No masses, redness, swelling, asymmetry, or associated skin lesions. No contractures.  Skin & Extremity Inspection: Skin color, temperature, and hair growth are WNL. No peripheral edema or cyanosis. No masses, redness, swelling, asymmetry, or associated skin lesions. No contractures.  Functional ROM: Unrestricted ROM          Functional ROM: Unrestricted ROM          Muscle Tone/Strength: Functionally intact. No obvious neuro-muscular anomalies detected.  Muscle Tone/Strength: Functionally intact. No obvious neuro-muscular anomalies detected.  Sensory (Neurological): Unimpaired          Sensory (Neurological): Unimpaired          Palpation: No palpable anomalies              Palpation: No palpable anomalies              Specialized Test(s): Deferred         Specialized Test(s): Deferred          Thoracic Spine Area Exam  Skin & Axial Inspection: No masses, redness, or swelling Alignment: Symmetrical Functional ROM: Unrestricted ROM Stability: No instability detected Muscle Tone/Strength: Functionally intact. No obvious neuro-muscular anomalies detected. Sensory (Neurological): Unimpaired Muscle strength & Tone: No palpable anomalies  Lumbar Spine Area Exam  Skin & Axial Inspection: No masses, redness, or swelling Alignment: Symmetrical Functional ROM: Unrestricted ROM      Stability: No instability detected Muscle Tone/Strength: Functionally intact. No obvious neuro-muscular anomalies detected. Sensory (Neurological): Unimpaired Palpation: No palpable anomalies       Provocative Tests: Lumbar Hyperextension and rotation test: evaluation deferred today       Lumbar Lateral bending test: evaluation deferred today       Patrick's Maneuver: evaluation deferred today                    Gait & Posture Assessment  Ambulation: Unassisted Gait: Relatively  normal for age and body habitus Posture: WNL   Lower Extremity Exam    Side: Right lower extremity  Side: Left lower extremity  Skin & Extremity Inspection: Skin color, temperature, and hair growth are WNL. No peripheral edema or cyanosis. No masses, redness, swelling, asymmetry, or associated skin lesions. No contractures.  Skin & Extremity Inspection: Evidence of prior arthroplastic surgery  Functional ROM: Unrestricted ROM          Functional ROM: Unrestricted ROM          Muscle Tone/Strength: Functionally intact. No obvious neuro-muscular anomalies detected.  Muscle Tone/Strength: Functionally intact. No obvious neuro-muscular anomalies detected.  Sensory (Neurological): Unimpaired  Sensory (Neurological): Unimpaired  Palpation: No palpable anomalies  Palpation: No palpable anomalies   Assessment  Primary Diagnosis & Pertinent Problem List: The primary encounter diagnosis was Cervical spondylosis without myelopathy. Diagnoses of Cervical facet syndrome ([place]), Neck pain on left side, SI (sacroiliac) joint dysfunction, and Chronic pain syndrome were also pertinent to this visit.  Status Diagnosis  Worsening Worsening Worsening 1. Cervical spondylosis without myelopathy   2. Cervical facet syndrome ([facility])   3. Neck pain on left side   4. SI (sacroiliac) joint dysfunction   5. Chronic pain syndrome       66 year old female with a past medical history of hypertension, migraines, osteoporosis, rheumatoid arthritis who has low back pain which radiates to the left leg.Patient's lumbar MRI is significant for severe lumbar spondylosis and degenerative disc disease causing prominent impingement at L5-S1, moderate impingement  at L3-L4 and mild impingement at L4-L5, left greater than right.Patient is status post bilateral sacroiliac joint injections on 10/20/2016 which provided the patient with significant pain relief for approximately a week and moderate pain relief ongoing.  Patient presents  today for medication refill. Upon checking [facility] PMP, I reminded the patient that all opioid medications need to come from our clinic including tramadol which she was written a prescription for May 1. Patient endorsed understanding of our clinic policy  Patient is having some GI issues with oxycodone saying that has made her sick to her stomach on a couple of occasions. We will trial hydrocodone 7.5 mg twice a day when necessary, quantity 60 a month.  Patient is also endorsing left-sided neck pain and headaches that seem to be related to facet pathology in the cervical spine. Patient has pain referral in her left shoulder left periscapular region and left neck consistent with a left facet mediated pain pattern. She also has pain overlying her left cervical facets and pain with cervical extension.  We discussed left C4, C5, C6, C7 diagnostic facet block with goal radiofrequency ablation if effective. Questions answered. Risks and benefits discussed. Patient wants to proceed.  Plan: -Continue gabapentin 600 mg daily at bedtime, Mobic 15 mg daily, tizanidine 4 mg twice a day when necessary. No refills necessary. -prescription for hydrocodone 7.5 mg for 2 months below -Left C4, C5, C6, C7 cervical facet block for cervical spondylosis   Plan of Care  Pharmacotherapy (Medications Ordered): Meds ordered this encounter  Medications  . DISCONTD: HYDROcodone-acetaminophen (NORCO) 7.5-325 MG tablet    Sig: Take 1 tablet by mouth every 6 (six) hours as needed for severe pain. For Chronic Pain DNF 11/26/16, 12/26/16    Dispense:  60 tablet    Refill:  0  . DISCONTD: HYDROcodone-acetaminophen (NORCO) 7.5-325 MG tablet    Sig: Take 1 tablet by mouth every 6 (six) hours as needed for severe pain. For Chronic Pain DNF 11/26/16, 12/26/16    Dispense:  60 tablet    Refill:  0  . DISCONTD: HYDROcodone-acetaminophen (NORCO) 7.5-325 MG tablet    Sig: Take 1 tablet by mouth 2 (two) times daily as needed  for severe pain. For Chronic Pain DNF 11/26/16, 12/26/16    Dispense:  60 tablet    Refill:  0  . HYDROcodone-acetaminophen (NORCO) 7.5-325 MG tablet    Sig: Take 1 tablet by mouth 2 (two) times daily as needed for severe pain. For Chronic Pain DNF 11/26/16, 12/26/16    Dispense:  60 tablet    Refill:  0   Lab-work, procedure(s), and/or referral(s): Orders Placed This Encounter  Procedures  . CERVICAL FACET (MEDIAL BRANCH NERVE BLOCK)     Pharmacological management options:  Opioid Analgesics: We'll take over management today. See above orders Membrane stabilizer:To be determined at a later timecurrently on gabapentin 600 mg daily at bedtime, can consider increase in the future or trialing Lyrica. Patient may also benefit from Topamax that she has recently been endorsing migraines.   Muscle relaxant:To be determined at a later time, trial of tizanidine   NSAID:To be determined at a later timeon Mobic 15 mg daily   Other analgesic(s):To be determined at a later timeVoltaren GEL, TENS, SNRI, TENS       Interventional management options: Planned, scheduled, and/or pending:    -left C4, C5, C6, C7 facet block -Repeat bilateral SI joint injection (previous 1 10/20/2016) -Genicular nerve blocks  -intra-articular halogen injection  PRN Procedures:   To be determined at a later time   Provider-requested follow-up: Return for Medication Management, Procedure.  Future Appointments Date Time Provider [place]  12/01/2016 10:15 AM Gillis Santa, MD [facility] None  01/22/2017 10:00 AM Gillis Santa, MD [facility] None    Primary Care Physician: Marden Noble, MD Location: [facility] Facility Note by: Gillis Santa, M.D Date: 11/18/2016; Time: 1:16 PM  Patient Instructions  .You have been handed 2 prescriptions for Hydrocodone today and been given pre procedure instructions for your upcoming procedure with sedation.     Prescription for  hydrocodone 7.5 mg twice daily as needed for severe pain. Prescription provided for 2 months. 2. Plan for left cervical facet injections for your left neck pain and headaches.       Preparing for Procedure with Sedation Instructions: . Oral Intake: Do not eat or drink anything for at least 8 hours prior to your procedure. . Transportation: Public transportation is not allowed. Bring an adult driver. The driver must be physically present in our waiting room before any procedure can be started. Marland Kitchen Physical Assistance: Bring an adult capable of physically assisting you, in the event you need help. . Blood Pressure Medicine: Take your blood pressure medicine with a sip of water the morning of the procedure. . Insulin: Take only  of your normal insulin dose. . Preventing infections: Shower with an antibacterial soap the morning of your procedure. . Build-up your immune system: Take 1000 mg of Vitamin C with every meal (3 times a day) the day prior to your procedure. . Pregnancy: If you are pregnant, call and cancel the procedure. . Sickness: If you have a cold, fever, or any active infections, call and cancel the procedure. . Arrival: You must be in the facility at least 30 minutes prior to your scheduled procedure. . Children: Do not bring children with you. . Dress appropriately: Bring dark clothing that you would not mind if they get stained. . Valuables: Do not bring any jewelry or valuables. Procedure appointments are reserved for interventional treatments only. Marland Kitchen No Prescription Refills. . No medication changes will be discussed during procedure appointments. No disability issues will be discussed.

## 2016-11-18 NOTE — Progress Notes (Signed)
Nursing Pain Medication Assessment:  Safety precautions to be maintained throughout the outpatient stay will include: orient to surroundings, keep bed in low position, maintain call bell within reach at all times, provide assistance with transfer out of bed and ambulation.  Medication Inspection Compliance: Pill count conducted under aseptic conditions, in front of the patient. Neither the pills nor the bottle was removed from the patient's sight at any time. Once count was completed pills were immediately returned to the patient in their original bottle.  Medication: Oxycodone IR Pill/Patch Count: 5 of 60 pills remain Pill/Patch Appearance: Markings consistent with prescribed medication Bottle Appearance: Standard pharmacy container. Clearly labeled. Filled Date09/ 04 / 2018 Last Medication intake:  Today

## 2016-12-01 ENCOUNTER — Ambulatory Visit
Admission: RE | Admit: 2016-12-01 | Discharge: 2016-12-01 | Disposition: A | Discharge status: Home / Self Care | Payer: Medicare Other | Source: Ambulatory Visit | Attending: Student in an Organized Health Care Education/Training Program | Admitting: Student in an Organized Health Care Education/Training Program

## 2016-12-01 ENCOUNTER — Ambulatory Visit (HOSPITAL_BASED_OUTPATIENT_CLINIC_OR_DEPARTMENT_OTHER): Payer: Medicare Other | Admitting: Student in an Organized Health Care Education/Training Program

## 2016-12-01 ENCOUNTER — Encounter: Payer: Self-pay | Admitting: Student in an Organized Health Care Education/Training Program

## 2016-12-01 VITALS — BP 137/83 | HR 52 | Temp 96.9°F | Resp 14 | Ht 71.0 in | Wt 240.0 lb

## 2016-12-01 DIAGNOSIS — Z880 Allergy status to penicillin: Secondary | ICD-10-CM | POA: Insufficient documentation

## 2016-12-01 DIAGNOSIS — G894 Chronic pain syndrome: Secondary | ICD-10-CM

## 2016-12-01 DIAGNOSIS — M542 Cervicalgia: Secondary | ICD-10-CM

## 2016-12-01 DIAGNOSIS — M47812 Spondylosis without myelopathy or radiculopathy, cervical region: Secondary | ICD-10-CM

## 2016-12-01 MED ORDER — ROPIVACAINE HCL 2 MG/ML IJ SOLN
9.0000 mL | Freq: Once | INTRAMUSCULAR | Status: AC
  Administered 2016-12-01: 10 mL via PERINEURAL
  Filled 2016-12-01: qty 10

## 2016-12-01 MED ORDER — FENTANYL CITRATE (PF) 100 MCG/2ML IJ SOLN
25.0000 ug | INTRAMUSCULAR | Status: DC | PRN
  Administered 2016-12-01: 50 ug via INTRAVENOUS
  Filled 2016-12-01: qty 2

## 2016-12-01 MED ORDER — LIDOCAINE HCL (PF) 1 % IJ SOLN
10.0000 mL | Freq: Once | INTRAMUSCULAR | Status: AC
  Administered 2016-12-01: 5 mL
  Filled 2016-12-01: qty 10

## 2016-12-01 NOTE — Progress Notes (Signed)
Safety precautions to be maintained throughout the outpatient stay will include: orient to surroundings, keep bed in low position, maintain call bell within reach at all times, provide assistance with transfer out of bed and ambulation.  

## 2016-12-01 NOTE — Progress Notes (Signed)
Patient's Name: Michele Moore  MRN: [id number]  Referring Provider: Gillis Santa, MD  DOB: September 14, 1950  PCP: Marden Noble, MD  DOS: 12/01/2016  Note by: Gillis Santa, MD  Service setting: Ambulatory outpatient  Specialty: Interventional Pain Management  Patient type: Established  Location: [facility] (AMB) Pain Management Facility  Visit type: Interventional Procedure   Primary Reason for Visit: Interventional Pain Management Treatment. CC: Neck Pain (left sided)  Procedure:  Anesthesia, Analgesia, Anxiolysis:  Type: Diagnostic Cervical Facet Medial Branch Block#1 Region: Posterolateral cervical spine region Level: C4, C5, C6, & C7 Medial Branch Level(s) Laterality: Left-Sided Paraspinal  Type: Local Anesthesia with Moderate (Conscious) Sedation Local Anesthetic: Lidocaine 1% Route: Intravenous (IV) IV Access: Secured Sedation: Meaningful verbal contact was maintained at all times during the procedure  Indication(s): Analgesia and Anxiety   Indications: 1. Cervical facet syndrome   2. Cervical spondylosis without myelopathy   3. Neck pain on left side   4. Chronic pain syndrome    Pain Score: Pre-procedure: 8 /10 Post-procedure: 0-No pain/10  Pre-op Assessment:  Michele Moore is a 66 y.o. (year old), female patient, seen today for interventional treatment. She  has a past surgical history that includes Carpal tunnel release; Rotator cuff repair; Ankle surgery; Tubal ligation; Colonoscopy (2000?); Upper gi endoscopy (2000?); Joint replacement; Tonsillectomy; and Fusion of talonavicular joint (Right, 08/03/2015). Michele Moore has a current medication list which includes the following prescription(s): aspirin, aspirin ec, cefuroxime, chlorpheniramine-hydrocodone, duloxetine, econazole nitrate, enoxaparin, fexofenadine-pseudoephedrine, fexofenadine-pseudoephedrine, gabapentin, gabapentin, hydralazine, hydralazine, hydrocodone-acetaminophen, hydroxyzine, influenza vac split quadrivalent pf, ketoconazole,  levothyroxine, lidocaine, meloxicam, meloxicam, mupirocin cream, mupirocin ointment, nystatin cream, omega-3 acid ethyl esters, omega-3 acid ethyl esters, omeprazole, omeprazole, polyethyl glycol-propyl glycol, polyethylene glycol, polyethylene glycol powder, potassium chloride sa, potassium chloride sa, prednisone, pregabalin, ranitidine, ranitidine, synthroid, tizanidine, tizanidine, tramadol, triamcinolone cream, triamterene-hydrochlorothiazide, triamterene-hydrochlorothiazide, and trimethoprim-polymyxin b, and the following Facility-Administered Medications: fentanyl. Her primarily concern today is the Neck Pain (left sided)  Initial Vital Signs: Blood pressure 124/74, pulse 71, temperature 98.5 F (36.9 C), temperature source Oral, resp. rate 18, height 5\' 11"  (1.803 m), weight 240 lb (108.9 kg), SpO2 98 %. BMI: Estimated body mass index is 33.47 kg/m as calculated from the following:   Height as of this encounter: 5\' 11"  (1.803 m).   Weight as of this encounter: 240 lb (108.9 kg).  Risk Assessment: Allergies: Reviewed. She is allergic to ampicillin; penicillins; and vibramycin [doxycycline calcium].  Allergy Precautions: None required Coagulopathies: Reviewed. None identified.  Blood-thinner therapy: None at this time Active Infection(s): Reviewed. None identified. Michele Moore is afebrile  Site Confirmation: Michele Moore was asked to confirm the procedure and laterality before marking the site Procedure checklist: Completed Consent: Before the procedure and under the influence of no sedative(s), amnesic(s), or anxiolytics, the patient was informed of the treatment options, risks and possible complications. To fulfill our ethical and legal obligations, as recommended by the American Medical Association's Code of Ethics, I have informed the patient of my clinical impression; the nature and purpose of the treatment or procedure; the risks, benefits, and possible complications of the intervention; the  alternatives, including doing nothing; the risk(s) and benefit(s) of the alternative treatment(s) or procedure(s); and the risk(s) and benefit(s) of doing nothing. The patient was provided information about the general risks and possible complications associated with the procedure. These may include, but are not limited to: failure to achieve desired goals, infection, bleeding, organ or nerve damage, allergic reactions, paralysis, and death. In addition, the patient was  informed of those risks and complications associated to Spine-related procedures, such as failure to decrease pain; infection (i.e.: Meningitis, epidural or intraspinal abscess); bleeding (i.e.: epidural hematoma, subarachnoid hemorrhage, or any other type of intraspinal or peri-dural bleeding); organ or nerve damage (i.e.: Any type of peripheral nerve, nerve root, or spinal cord injury) with subsequent damage to sensory, motor, and/or autonomic systems, resulting in permanent pain, numbness, and/or weakness of one or several areas of the body; allergic reactions; (i.e.: anaphylactic reaction); and/or death. Furthermore, the patient was informed of those risks and complications associated with the medications. These include, but are not limited to: allergic reactions (i.e.: anaphylactic or anaphylactoid reaction(s)); adrenal axis suppression; blood sugar elevation that in diabetics may result in ketoacidosis or comma; water retention that in patients with history of congestive heart failure may result in shortness of breath, pulmonary edema, and decompensation with resultant heart failure; weight gain; swelling or edema; medication-induced neural toxicity; particulate matter embolism and blood vessel occlusion with resultant organ, and/or nervous system infarction; and/or aseptic necrosis of one or more joints. Finally, the patient was informed that Medicine is not an exact science; therefore, there is also the possibility of unforeseen or  unpredictable risks and/or possible complications that may result in a catastrophic outcome. The patient indicated having understood very clearly. We have given the patient no guarantees and we have made no promises. Enough time was given to the patient to ask questions, all of which were answered to the patient's satisfaction. Ms. Badolato has indicated that she wanted to continue with the procedure. Attestation: I, the ordering provider, attest that I have discussed with the patient the benefits, risks, side-effects, alternatives, likelihood of achieving goals, and potential problems during recovery for the procedure that I have provided informed consent. Date: 12/01/2016; Time: 11:07 AM  Pre-Procedure Preparation:  Monitoring: As per clinic protocol. Respiration, ETCO2, SpO2, BP, heart rate and rhythm monitor placed and checked for adequate function Safety Precautions: Patient was assessed for positional comfort and pressure points before starting the procedure. Time-out: I initiated and conducted the "Time-out" before starting the procedure, as per protocol. The patient was asked to participate by confirming the accuracy of the "Time Out" information. Verification of the correct person, site, and procedure were performed and confirmed by me, the nursing staff, and the patient. "Time-out" conducted as per Joint Commission's Universal Protocol (UP.01.01.01). "Time-out" Date & Time: 12/01/2016; 1145 hrs.  Description of Procedure Process:   Position: Prone with head of the table was raised to facilitate breathing. Target Area: For Cervical Facet blocks, the target is the postero-lateral waist of the articular pillars at the C4, C5, C6, & C7 levels. Approach: Posterior approach. Area Prepped: Entire Posterior Cervico-thoracic Region Prepping solution: ChloraPrep (2% chlorhexidine gluconate and 70% isopropyl alcohol) Safety Precautions: Aspiration looking for blood return was conducted prior to all  injections. At no point did we inject any substances, as a needle was being advanced. No attempts were made at seeking any paresthesias. Safe injection practices and needle disposal techniques used. Medications properly checked for expiration dates. SDV (single dose vial) medications used. Description of the Procedure: Protocol guidelines were followed. The patient was placed in position over the fluoroscopy table. The target area was identified and the area prepped in the usual manner. Skin desensitized using vapocoolant spray. Skin & deeper tissues infiltrated with local anesthetic. Appropriate amount of time allowed to pass for local anesthetics to take effect. The procedure needle was introduced through the skin, ipsilateral to the reported  pain, and advanced to the target area. Bone was contacted on the posterior aspect of the articular pillars and the needle walked lateral, until the border was cleared. Lateral views taken to make sure the needle tip did not advance past the posterior third of the lateral mass of the posterior columns. The procedure was repeated in identical fashion for each level. Negative aspiration confirmed. Solution injected in intermittent fashion, asking for systemic symptoms every 0.5cc of injectate. The needles were then removed and the area cleansed, making sure to leave some of the prepping solution back to take advantage of its long term bactericidal properties. Vitals:   12/01/16 1200 12/01/16 1212 12/01/16 1221 12/01/16 1231  BP: 132/78 138/69 (!) 156/83 137/83  Pulse: (!) 56 60 (!) 53 (!) 52  Resp: 14 16 17 14   Temp:    (!) 96.9 F (36.1 C)  TempSrc:      SpO2: 96% 98% 98% 98%  Weight:      Height:        Start Time: 1146 hrs. End Time: 1158 hrs. Materials:  Needle(s) Type: Regular needle Gauge: 22G Length: 3.5-in Medication(s): We administered fentaNYL, ropivacaine (PF) 2 mg/mL (0.2%), and lidocaine (PF). Please see chart orders for dosing details. 1 cc of  0.2% ropivacaine at each level Imaging Guidance (Spinal):  Type of Imaging Technique: Fluoroscopy Guidance (Spinal) Indication(s): Assistance in needle guidance and placement for procedures requiring needle placement in or near specific anatomical locations not easily accessible without such assistance. Exposure Time: Please see nurses notes. Contrast: None used. Fluoroscopic Guidance: I was personally present during the use of fluoroscopy. "Tunnel Vision Technique" used to obtain the best possible view of the target area. Parallax error corrected before commencing the procedure. "Direction-depth-direction" technique used to introduce the needle under continuous pulsed fluoroscopy. Once target was reached, antero-posterior, oblique, and lateral fluoroscopic projection used confirm needle placement in all planes. Images permanently stored in EMR. Interpretation: No contrast injected. I personally interpreted the imaging intraoperatively. Adequate needle placement confirmed in multiple planes. Permanent images saved into the patient's record.  Antibiotic Prophylaxis:  Indication(s): None identified Antibiotic given: None  Post-operative Assessment:  EBL: None Complications: No immediate post-treatment complications observed by team, or reported by patient. Note: The patient tolerated the entire procedure well. A repeat set of vitals were taken after the procedure and the patient was kept under observation following institutional policy, for this type of procedure. Post-procedural neurological assessment was performed, showing return to baseline, prior to discharge. The patient was provided with post-procedure discharge instructions, including a section on how to identify potential problems. Should any problems arise concerning this procedure, the patient was given instructions to immediately contact us, at any time, without hesitation. In any case, we plan to contact the patient by telephone for a  follow-up status report regarding this interventional procedure. Comments:  No additional relevant information. 5 out of 5 strength bilateral upper extremity: Shoulder abduction, elbow flexion, elbow extension, thumb extension.  Plan of Care    Imaging Orders     DG C-Arm 1-60 Min-No Report Procedure Orders    No procedure(s) ordered today    Medications ordered for procedure: Meds ordered this encounter  Medications  . fentaNYL (SUBLIMAZE) injection 25-50 mcg    Make sure Narcan is available in the pyxis when using this medication. In the event of respiratory depression (RR< 8/min): Titrate NARCAN (naloxone) in increments of 0.1 to 0.2 mg IV at 2-3 minute intervals, until desired degree of reversal.  .  ropivacaine (PF) 2 mg/mL (0.2%) (NAROPIN) injection 9 mL  . lidocaine (PF) (XYLOCAINE) 1 % injection 10 mL   Medications administered: We administered fentaNYL, ropivacaine (PF) 2 mg/mL (0.2%), and lidocaine (PF).  See the medical record for exact dosing, route, and time of administration.  New Prescriptions   No medications on file   Disposition: Discharge home  Discharge Date & Time: 12/01/2016; 1238 hrs.   Physician-requested Follow-up: Return in about 2 weeks (around 12/15/2016) for Post Procedure Evaluation. Future Appointments Date Time Provider [place]  01/01/2017 8:30 AM Gillis Santa, MD [facility] None  01/22/2017 10:00 AM Gillis Santa, MD [facility] None   Primary Care Physician: Marden Noble, MD Location: [facility] Outpatient Pain Management Facility Note by: Gillis Santa, MD Date: 12/01/2016; Time: 2:01 PM  Disclaimer:  Medicine is not an exact science. The only guarantee in medicine is that nothing is guaranteed. It is important to note that the decision to proceed with this intervention was based on the information collected from the patient. The Data and conclusions were drawn from the patient's questionnaire, the interview, and the physical examination.  Because the information was provided in large part by the patient, it cannot be guaranteed that it has not been purposely or unconsciously manipulated. Every effort has been made to obtain as much relevant data as possible for this evaluation. It is important to note that the conclusions that lead to this procedure are derived in large part from the available data. Always take into account that the treatment will also be dependent on availability of resources and existing treatment guidelines, considered by other Pain Management Practitioners as being common knowledge and practice, at the time of the intervention. For Medico-Legal purposes, it is also important to point out that variation in procedural techniques and pharmacological choices are the acceptable norm. The indications, contraindications, technique, and results of the above procedure should only be interpreted and judged by a Board-Certified Interventional Pain Specialist with extensive familiarity and expertise in the same exact procedure and technique.

## 2016-12-01 NOTE — Patient Instructions (Signed)

## 2016-12-02 DIAGNOSIS — J391 Other abscess of pharynx: Secondary | ICD-10-CM | POA: Diagnosis not present

## 2016-12-15 ENCOUNTER — Ambulatory Visit: Payer: Medicare Other | Admitting: Student in an Organized Health Care Education/Training Program

## 2016-12-23 ENCOUNTER — Encounter: Payer: Self-pay | Admitting: Family Medicine

## 2016-12-23 ENCOUNTER — Ambulatory Visit (INDEPENDENT_AMBULATORY_CARE_PROVIDER_SITE_OTHER): Payer: Medicare Other | Admitting: Family Medicine

## 2016-12-23 VITALS — BP 122/80 | HR 72 | Ht 71.0 in | Wt 254.0 lb

## 2016-12-23 DIAGNOSIS — E876 Hypokalemia: Secondary | ICD-10-CM

## 2016-12-23 DIAGNOSIS — Z6835 Body mass index (BMI) 35.0-35.9, adult: Secondary | ICD-10-CM

## 2016-12-23 DIAGNOSIS — E785 Hyperlipidemia, unspecified: Secondary | ICD-10-CM

## 2016-12-23 DIAGNOSIS — E039 Hypothyroidism, unspecified: Secondary | ICD-10-CM

## 2016-12-23 DIAGNOSIS — K219 Gastro-esophageal reflux disease without esophagitis: Secondary | ICD-10-CM

## 2016-12-23 DIAGNOSIS — Z23 Encounter for immunization: Secondary | ICD-10-CM

## 2016-12-23 DIAGNOSIS — I10 Essential (primary) hypertension: Secondary | ICD-10-CM

## 2016-12-23 MED ORDER — RANITIDINE HCL 150 MG PO TABS: 150.0000 mg | ORAL_TABLET | Freq: Two times a day (BID) | ORAL | 5 refills | Status: DC

## 2016-12-23 MED ORDER — OMEGA-3-ACID ETHYL ESTERS 1 G PO CAPS: 2.0000 g | ORAL_CAPSULE | Freq: Two times a day (BID) | ORAL | 5 refills | Status: DC

## 2016-12-23 MED ORDER — TRIAMTERENE-HCTZ 75-50 MG PO TABS: 1.0000 | ORAL_TABLET | Freq: Every day | ORAL | 5 refills | Status: DC

## 2016-12-23 MED ORDER — HYDRALAZINE HCL 50 MG PO TABS: 50.0000 mg | ORAL_TABLET | Freq: Two times a day (BID) | ORAL | 5 refills | Status: DC

## 2016-12-23 MED ORDER — OMEPRAZOLE 40 MG PO CPDR: 40.0000 mg | DELAYED_RELEASE_CAPSULE | Freq: Every day | ORAL | 5 refills | Status: DC

## 2016-12-23 MED ORDER — LEVOTHYROXINE SODIUM 200 MCG PO TABS: 200.0000 ug | ORAL_TABLET | Freq: Every day | ORAL | 5 refills | Status: DC

## 2016-12-23 MED ORDER — POTASSIUM CHLORIDE CRYS ER 20 MEQ PO TBCR: 20.0000 meq | EXTENDED_RELEASE_TABLET | Freq: Every day | ORAL | 5 refills | Status: DC

## 2016-12-23 NOTE — Progress Notes (Signed)
Name: Michele Moore   MRN: [id number]    DOB: 1950/08/05   Date:12/23/2016       Progress Note  Subjective  Chief Complaint  Chief Complaint  Patient presents with  . Establish Care  . Gastroesophageal Reflux  . Hypertension  . Hypothyroidism    Patient presents for establish for medical concerns.   Gastroesophageal Reflux  She reports no abdominal pain, no chest pain, no coughing, no heartburn, no hoarse voice, no nausea, no sore throat or no wheezing. This is a chronic problem. The current episode started more than 1 year ago. The problem occurs occasionally. The problem has been waxing and waning. The symptoms are aggravated by certain foods. Pertinent negatives include no fatigue, melena or weight loss. She has tried a PPI and a histamine-2 antagonist for the symptoms. The treatment provided moderate relief.  Hypertension  This is a chronic problem. The current episode started more than 1 year ago. The problem has been waxing and waning since onset. The problem is controlled. Pertinent negatives include no blurred vision, chest pain, headaches, malaise/fatigue, neck pain, palpitations, shortness of breath or sweats. Agents associated with hypertension include thyroid hormones. Risk factors for coronary artery disease include dyslipidemia. Past treatments include diuretics (hydralazine). The current treatment provides moderate improvement. There are no compliance problems.  There is no history of angina, kidney disease, CAD/MI, CVA, heart failure, left ventricular hypertrophy, PVD or retinopathy. Identifiable causes of hypertension include a thyroid problem. There is no history of chronic renal disease, a hypertension causing med or renovascular disease.  Thyroid Problem  Presents for follow-up visit. Patient reports no anxiety, cold intolerance, constipation, depressed mood, diaphoresis, diarrhea, dry skin, fatigue, hair loss, heat intolerance, hoarse voice, nail problem, palpitations,  tremors, weight gain or weight loss. The symptoms have been stable. There is no history of heart failure.    No problem-specific Assessment & Plan notes found for this encounter.   Past Medical History:  Diagnosis Date  . Anemia   . Arthritis   . Chronic kidney disease    STAGE 3 PER DR EASON 08/02/15  . Family history of adverse reaction to anesthesia    sister difficult to put to sleep  . GERD (gastroesophageal reflux disease)   . Hemorrhoid   . History of hiatal hernia   . Hypertension   . Migraines   . Mixed hyperlipidemia   . Multiple gastric ulcers   . Thyroid disease     Past Surgical History:  Procedure Laterality Date  . ANKLE SURGERY    . CARPAL TUNNEL RELEASE     x3  . COLONOSCOPY  2000?  . FUSION OF TALONAVICULAR JOINT Right 08/03/2015   Procedure: TAILOR NAVICULAR JOINT FUSION - RIGHT ;  Surgeon: [name], DPM;  Location: [facility] ORS;  Service: Podiatry;  Laterality: Right;  . JOINT REPLACEMENT     knee x 3 ,right x1 and left x2  . ROTATOR CUFF REPAIR    . TONSILLECTOMY    . TUBAL LIGATION    . UPPER GI ENDOSCOPY  2000?    Family History  Problem Relation Age of Onset  . COPD Sister   . Cancer Maternal Aunt        breast  . Cancer Maternal Uncle        kidney    Social History   Social History  . Marital status: Divorced    Spouse name: N/A  . Number of children: N/A  . Years of education: N/A  Occupational History  . Not on file.   Social History Main Topics  . Smoking status: Former Smoker    Quit date: 02/24/1993  . Smokeless tobacco: Never Used  . Alcohol use No  . Drug use: No  . Sexual activity: Not on file   Other Topics Concern  . Not on file   Social History Narrative  . No narrative on file    Allergies  Allergen Reactions  . Ampicillin Swelling  . Penicillins Anaphylaxis  . Vibramycin [Doxycycline Calcium] Rash    Outpatient Medications Prior to Visit  Medication Sig Dispense Refill  . aspirin 81 MG tablet Take  81 mg by mouth daily.    . fexofenadine-pseudoephedrine (ALLEGRA-D 24) 180-240 MG 24 hr tablet Take by mouth. otc    . gabapentin (NEURONTIN) 300 MG capsule TAKE 2 CAPSULES BY MOUTH NIGHTLY/ Lateef    . HYDROcodone-acetaminophen (NORCO) 7.5-325 MG tablet Take 1 tablet by mouth 2 (two) times daily as needed for severe pain. For Chronic Pain DNF 11/26/16, 12/26/16 (Patient taking differently: Take 1 tablet by mouth 2 (two) times daily as needed for severe pain. For Chronic Pain DNF 11/26/16, 12/26/16/ Lateef) 60 tablet 0  . ketoconazole (NIZORAL) 2 % cream U UTD QHS    . lidocaine (XYLOCAINE) 5 % ointment Lateef    . meloxicam (MOBIC) 15 MG tablet Take 1 tablet (15 mg total) by mouth daily. (Patient taking differently: Take 15 mg by mouth daily. Lateef) 30 tablet 0  . mupirocin cream (BACTROBAN) 2 % Apply 1 application topically 2 (two) times daily. 15 g 0  . nystatin cream (MYCOSTATIN) Apply topically.    Vladimir Faster Glycol-Propyl Glycol (SYSTANE OP) Apply to eye.    . polyethylene glycol (MIRALAX / GLYCOLAX) packet Take 17 g by mouth daily. 14 each 0  . tiZANidine (ZANAFLEX) 4 MG capsule Take 1 capsule (4 mg total) by mouth 2 (two) times daily as needed. 60 capsule 2  . triamcinolone cream (KENALOG) 0.5 % Apply topically.    Marland Kitchen trimethoprim-polymyxin b (POLYTRIM) ophthalmic solution     . hydrALAZINE (APRESOLINE) 50 MG tablet Take 50 mg by mouth 2 (two) times daily.    Marland Kitchen levothyroxine (SYNTHROID) 200 MCG tablet Take 200 mcg by mouth daily before breakfast.     . omega-3 acid ethyl esters (LOVAZA) 1 G capsule Take 2 g by mouth 2 (two) times daily.   5  . omeprazole (PRILOSEC) 40 MG capsule Take 40 mg by mouth daily.   0  . potassium chloride SA (K-DUR,KLOR-CON) 20 MEQ tablet Take 20-40 mEq by mouth daily.   5  . ranitidine (ZANTAC) 150 MG tablet Take 150 mg by mouth 2 (two) times daily.   4  . triamterene-hydrochlorothiazide (MAXZIDE) 75-50 MG per tablet Take 1 tablet by mouth daily.   4  .  enoxaparin (LOVENOX) 40 MG/0.4ML injection Inject 0.4 mLs (40 mg total) into the skin daily. (Patient not taking: Reported on 12/23/2016) 9 Syringe 0  . aspirin EC 81 MG tablet Take by mouth.    . cefUROXime (CEFTIN) 500 MG tablet Take 1 tablet (500 mg total) by mouth 2 (two) times daily. 20 tablet 0  . chlorpheniramine-HYDROcodone (TUSSIONEX PENNKINETIC ER) 10-8 MG/5ML SUER Take 5 mLs by mouth every 12 (twelve) hours as needed for cough. 115 mL 0  . DULoxetine (CYMBALTA) 30 MG capsule TK ONE C PO D FOR NEUROPATHY    . econazole nitrate 1 % cream Apply topically.    . fexofenadine-pseudoephedrine (  ALLEGRA-D ALLERGY & CONGESTION) 180-240 MG 24 hr tablet Take 1 tablet by mouth daily. 30 tablet 0  . gabapentin (NEURONTIN) 300 MG capsule Take 600 mg by mouth at bedtime.    . hydrALAZINE (APRESOLINE) 50 MG tablet     . hydrOXYzine (ATARAX/VISTARIL) 25 MG tablet Take 1 tablet (25 mg total) by mouth every 6 (six) hours as needed for itching. 30 tablet 0  . Influenza vac split quadrivalent PF (FLUZONE HIGH-DOSE) 0.5 ML injection ADM 0.5ML IM UTD    . meloxicam (MOBIC) 15 MG tablet Take by mouth.    . mupirocin ointment (BACTROBAN) 2 % Apply 1 application topically 3 (three) times daily. 22 g 0  . omega-3 acid ethyl esters (LOVAZA) 1 g capsule     . omeprazole (PRILOSEC) 40 MG capsule TK ONE C PO QD    . polyethylene glycol powder (GLYCOLAX/MIRALAX) powder 255 grams one bottle for colonoscopy prep    . potassium chloride SA (K-DUR,KLOR-CON) 20 MEQ tablet Take by mouth.    . predniSONE (STERAPRED UNI-PAK 21 TAB) 10 MG (21) TBPK tablet Sig 6 tablet day 1, 5 tablets day 2, 4 tablets day 3,,3tablets day 4, 2 tablets day 5, 1 tablet day 6 take all tablets orally 21 tablet 0  . pregabalin (LYRICA) 75 MG capsule Start 1 capsule at night for 3-5 days, increasing by 1 capsule every 3-5 days as tolerated to target dose of 2 capsules twice daily.    . ranitidine (ZANTAC) 150 MG tablet Take by mouth.    . SYNTHROID  150 MCG tablet Take 150 mcg by mouth daily before breakfast.   5  . tiZANidine (ZANAFLEX) 4 MG tablet TAKE 1 TABLET(4 MG) BY MOUTH EVERY NIGHT AS NEEDED    . traMADol (ULTRAM) 50 MG tablet Take 50 mg by mouth 2 (two) times daily.    Marland Kitchen triamterene-hydrochlorothiazide (MAXZIDE) 75-50 MG tablet Take by mouth.     No facility-administered medications prior to visit.     Review of Systems  Constitutional: Negative for chills, diaphoresis, fatigue, fever, malaise/fatigue, weight gain and weight loss.  HENT: Negative for ear discharge, ear pain, hoarse voice and sore throat.   Eyes: Negative for blurred vision.  Respiratory: Negative for cough, sputum production, shortness of breath and wheezing.   Cardiovascular: Negative for chest pain, palpitations and leg swelling.  Gastrointestinal: Negative for abdominal pain, blood in stool, constipation, diarrhea, heartburn, melena and nausea.  Genitourinary: Negative for dysuria, frequency, hematuria and urgency.  Musculoskeletal: Negative for back pain, joint pain, myalgias and neck pain.  Skin: Negative for rash.  Neurological: Negative for dizziness, tingling, tremors, sensory change, focal weakness and headaches.  Endo/Heme/Allergies: Negative for environmental allergies, cold intolerance, heat intolerance and polydipsia. Does not bruise/bleed easily.  Psychiatric/Behavioral: Negative for depression and suicidal ideas. The patient is not nervous/anxious and does not have insomnia.      Objective  Vitals:   12/23/16 1416  BP: 122/80  Pulse: 72  Weight: 254 lb (115.2 kg)  Height: 5\' 11"  (1.803 m)    Physical Exam  Constitutional: She is well-developed, well-nourished, and in no distress. No distress.  HENT:  Head: Normocephalic and atraumatic.  Right Ear: External ear normal.  Left Ear: External ear normal.  Nose: Nose normal.  Mouth/Throat: Oropharynx is clear and moist.  Eyes: Pupils are equal, round, and reactive to light.  Conjunctivae and EOM are normal. Right eye exhibits no discharge. Left eye exhibits no discharge.  Neck: Normal range of motion. Neck  supple. No JVD present. No thyromegaly present.  Cardiovascular: Normal rate, regular rhythm, normal heart sounds and intact distal pulses.  Exam reveals no gallop and no friction rub.   No murmur heard. Pulmonary/Chest: Effort normal and breath sounds normal. She has no wheezes. She has no rales.  Abdominal: Soft. Bowel sounds are normal. She exhibits no mass. There is no tenderness. There is no guarding.  Musculoskeletal: Normal range of motion. She exhibits no edema.  Lymphadenopathy:    She has no cervical adenopathy.  Neurological: She is alert.  Skin: Skin is warm and dry. She is not diaphoretic.  Psychiatric: Mood and affect normal.  Nursing note and vitals reviewed.     Assessment & Plan  Problem List Items Addressed This Visit    None    Visit Diagnoses    Hypertension, unspecified type    -  Primary   Relevant Medications   hydrALAZINE (APRESOLINE) 50 MG tablet   omega-3 acid ethyl esters (LOVAZA) 1 g capsule   triamterene-hydrochlorothiazide (MAXZIDE) 75-50 MG tablet   Other Relevant Orders   Renal Function Panel   Hypothyroidism, unspecified type       Relevant Medications   levothyroxine (SYNTHROID) 200 MCG tablet   Other Relevant Orders   TSH   Gastroesophageal reflux disease, esophagitis presence not specified       Relevant Medications   omeprazole (PRILOSEC) 40 MG capsule   ranitidine (ZANTAC) 150 MG tablet   Severe obesity (BMI 35.0-35.9 with comorbidity) (HCC)       Relevant Medications   omega-3 acid ethyl esters (LOVAZA) 1 g capsule   Other Relevant Orders   Lipid Profile   Influenza vaccine needed       Relevant Orders   Flu vaccine HIGH DOSE PF (Completed)   Need for 23-polyvalent pneumococcal polysaccharide vaccine       Relevant Orders   Pneumococcal polysaccharide vaccine 23-valent greater than or equal to 2yo  subcutaneous/IM (Completed)   Hypokalemia       Relevant Medications   potassium chloride SA (K-DUR,KLOR-CON) 20 MEQ tablet   Hyperlipidemia, unspecified hyperlipidemia type       Relevant Medications   hydrALAZINE (APRESOLINE) 50 MG tablet   omega-3 acid ethyl esters (LOVAZA) 1 g capsule   triamterene-hydrochlorothiazide (MAXZIDE) 75-50 MG tablet      Meds ordered this encounter  Medications  . hydrALAZINE (APRESOLINE) 50 MG tablet    Sig: Take 1 tablet (50 mg total) by mouth 2 (two) times daily.    Dispense:  60 tablet    Refill:  5  . omeprazole (PRILOSEC) 40 MG capsule    Sig: Take 1 capsule (40 mg total) by mouth daily.    Dispense:  30 capsule    Refill:  5  . potassium chloride SA (K-DUR,KLOR-CON) 20 MEQ tablet    Sig: Take 1 tablet (20 mEq total) by mouth daily.    Dispense:  30 tablet    Refill:  5  . levothyroxine (SYNTHROID) 200 MCG tablet    Sig: Take 1 tablet (200 mcg total) by mouth daily before breakfast.    Dispense:  30 tablet    Refill:  5  . omega-3 acid ethyl esters (LOVAZA) 1 g capsule    Sig: Take 2 capsules (2 g total) by mouth 2 (two) times daily.    Dispense:  120 capsule    Refill:  5  . ranitidine (ZANTAC) 150 MG tablet    Sig: Take 1 tablet (150 mg total)  by mouth 2 (two) times daily.    Dispense:  60 tablet    Refill:  5  . triamterene-hydrochlorothiazide (MAXZIDE) 75-50 MG tablet    Sig: Take 1 tablet by mouth daily.    Dispense:  30 tablet    Refill:  5   I spent 60 minutes with this patient, More than 50% of that time was spent in face to face education, counseling and care coordination.   Dr. Macon [facility]  12/23/16

## 2016-12-24 ENCOUNTER — Other Ambulatory Visit: Payer: Self-pay

## 2016-12-24 DIAGNOSIS — R7989 Other specified abnormal findings of blood chemistry: Secondary | ICD-10-CM

## 2016-12-24 LAB — RENAL FUNCTION PANEL
ALBUMIN: 4.5 g/dL (ref 3.6–4.8)
BUN/Creatinine Ratio: 15 (ref 12–28)
BUN: 30 mg/dL — ABNORMAL HIGH (ref 8–27)
CALCIUM: 9.7 mg/dL (ref 8.7–10.3)
CO2: 25 mmol/L (ref 20–29)
CREATININE: 1.96 mg/dL — AB (ref 0.57–1.00)
Chloride: 103 mmol/L (ref 96–106)
GFR calc Af Amer: 30 mL/min/{1.73_m2} — ABNORMAL LOW (ref >59)
GFR, EST NON AFRICAN AMERICAN: 26 mL/min/{1.73_m2} — AB (ref >59)
Glucose: 84 mg/dL (ref 65–99)
PHOSPHORUS: 3 mg/dL (ref 2.5–4.5)
Potassium: 4.5 mmol/L (ref 3.5–5.2)
SODIUM: 141 mmol/L (ref 134–144)

## 2016-12-24 LAB — TSH: TSH: 2.57 u[IU]/mL (ref 0.450–4.500)

## 2016-12-24 LAB — LIPID PANEL
CHOL/HDL RATIO: 3.1 ratio (ref 0.0–4.4)
Cholesterol, Total: 197 mg/dL (ref 100–199)
HDL: 64 mg/dL (ref >39)
LDL CALC: 113 mg/dL — AB (ref 0–99)
Triglycerides: 102 mg/dL (ref 0–149)
VLDL Cholesterol Cal: 20 mg/dL (ref 5–40)

## 2016-12-24 NOTE — Progress Notes (Unsigned)
Ref neph

## 2016-12-31 ENCOUNTER — Telehealth: Payer: Self-pay | Admitting: *Deleted

## 2016-12-31 DIAGNOSIS — L03113 Cellulitis of right upper limb: Secondary | ICD-10-CM | POA: Diagnosis not present

## 2016-12-31 NOTE — Telephone Encounter (Signed)
States pain in the shoulder began the next day after receiving a flu shot. Patient advised to see PCP or go to Urgent Care for evaluation.

## 2016-12-31 NOTE — Telephone Encounter (Signed)
Patient called back stating she is going to the Ed instead of urgent care. She had left msg at 10:16 asking someone to call. When I tried to call back I had to leave a msg telling her I called. When she called back she was upset that we had not called her back before this time. I toled her I would let someone know that she had called again

## 2017-01-01 ENCOUNTER — Ambulatory Visit: Payer: Medicare Other | Admitting: Student in an Organized Health Care Education/Training Program

## 2017-01-02 ENCOUNTER — Other Ambulatory Visit: Payer: Self-pay

## 2017-01-02 ENCOUNTER — Emergency Department: Payer: Medicare Other

## 2017-01-02 ENCOUNTER — Ambulatory Visit (INDEPENDENT_AMBULATORY_CARE_PROVIDER_SITE_OTHER): Payer: Medicare Other | Admitting: Family Medicine

## 2017-01-02 ENCOUNTER — Emergency Department
Admission: EM | Admit: 2017-01-02 | Discharge: 2017-01-02 | Disposition: A | Discharge status: Home / Self Care | Payer: Medicare Other | Attending: Emergency Medicine | Admitting: Emergency Medicine

## 2017-01-02 ENCOUNTER — Encounter: Payer: Self-pay | Admitting: Emergency Medicine

## 2017-01-02 ENCOUNTER — Encounter: Payer: Self-pay | Admitting: Family Medicine

## 2017-01-02 VITALS — BP 130/70 | HR 68 | Temp 98.1°F | Ht 71.0 in | Wt 255.0 lb

## 2017-01-02 DIAGNOSIS — M129 Arthropathy, unspecified: Secondary | ICD-10-CM

## 2017-01-02 DIAGNOSIS — I129 Hypertensive chronic kidney disease with stage 1 through stage 4 chronic kidney disease, or unspecified chronic kidney disease: Secondary | ICD-10-CM | POA: Insufficient documentation

## 2017-01-02 DIAGNOSIS — Z87891 Personal history of nicotine dependence: Secondary | ICD-10-CM | POA: Insufficient documentation

## 2017-01-02 DIAGNOSIS — Z79899 Other long term (current) drug therapy: Secondary | ICD-10-CM | POA: Diagnosis not present

## 2017-01-02 DIAGNOSIS — Z7982 Long term (current) use of aspirin: Secondary | ICD-10-CM | POA: Diagnosis not present

## 2017-01-02 DIAGNOSIS — M159 Polyosteoarthritis, unspecified: Secondary | ICD-10-CM | POA: Diagnosis not present

## 2017-01-02 DIAGNOSIS — M1990 Unspecified osteoarthritis, unspecified site: Secondary | ICD-10-CM | POA: Diagnosis not present

## 2017-01-02 DIAGNOSIS — Z7901 Long term (current) use of anticoagulants: Secondary | ICD-10-CM | POA: Diagnosis not present

## 2017-01-02 DIAGNOSIS — M5412 Radiculopathy, cervical region: Secondary | ICD-10-CM

## 2017-01-02 DIAGNOSIS — M5410 Radiculopathy, site unspecified: Secondary | ICD-10-CM | POA: Diagnosis not present

## 2017-01-02 DIAGNOSIS — N183 Chronic kidney disease, stage 3 (moderate): Secondary | ICD-10-CM | POA: Insufficient documentation

## 2017-01-02 DIAGNOSIS — M79601 Pain in right arm: Secondary | ICD-10-CM | POA: Diagnosis not present

## 2017-01-02 DIAGNOSIS — M25511 Pain in right shoulder: Secondary | ICD-10-CM | POA: Diagnosis not present

## 2017-01-02 DIAGNOSIS — Z96653 Presence of artificial knee joint, bilateral: Secondary | ICD-10-CM | POA: Insufficient documentation

## 2017-01-02 DIAGNOSIS — M542 Cervicalgia: Secondary | ICD-10-CM | POA: Diagnosis not present

## 2017-01-02 DIAGNOSIS — M79603 Pain in arm, unspecified: Secondary | ICD-10-CM

## 2017-01-02 MED ORDER — PREDNISONE 10 MG PO TABS: ORAL_TABLET | ORAL | 0 refills | Status: DC

## 2017-01-02 MED ORDER — CYCLOBENZAPRINE HCL 5 MG PO TABS: 5.0000 mg | ORAL_TABLET | Freq: Every day | ORAL | 0 refills | Status: AC

## 2017-01-02 MED ORDER — CYCLOBENZAPRINE HCL 5 MG PO TABS: 5.0000 mg | ORAL_TABLET | Freq: Every day | ORAL | 0 refills | Status: DC

## 2017-01-02 NOTE — ED Provider Notes (Signed)
[facility] Emergency Department Provider Note  ____________________________________________  Time seen: Approximately 5:46 PM  I have reviewed the triage vital signs and the nursing notes.   HISTORY  Chief Complaint Shoulder Pain    HPI Michele Moore is a 66 y.o. female that presents to the emergency department for evaluation of right arm pain for 2 weeks.  Patient states that pain began after receiving a pneumonia shot.  She states that it is painful when she lifts up her right arm.  It is painful when she presses where the shot was administered.  Occasionally pain starts in her shoulder and radiates down her arm.  She saw her doctor last week and was given clindamycin to cover for cellulitis.  Pain has not improved.  No trauma.  No neck pain, shortness of breath, chest pain, nausea, vomiting, abdominal pain.   Past Medical History:  Diagnosis Date  . Anemia   . Arthritis   . Chronic kidney disease    STAGE 3 PER DR EASON 08/02/15  . Family history of adverse reaction to anesthesia    sister difficult to put to sleep  . GERD (gastroesophageal reflux disease)   . Hemorrhoid   . History of hiatal hernia   . Hypertension   . Migraines   . Mixed hyperlipidemia   . Multiple gastric ulcers   . Thyroid disease     Patient Active Problem List   Diagnosis Date Noted  . Pain, lower extremity 08/03/2015    Past Surgical History:  Procedure Laterality Date  . ANKLE SURGERY    . CARPAL TUNNEL RELEASE     x3  . COLONOSCOPY  2000?  Marland Kitchen JOINT REPLACEMENT     knee x 3 ,right x1 and left x2  . ROTATOR CUFF REPAIR    . TONSILLECTOMY    . TUBAL LIGATION    . UPPER GI ENDOSCOPY  2000?    Prior to Admission medications   Medication Sig Start Date End Date Taking? Authorizing Provider  aspirin 81 MG tablet Take 81 mg by mouth daily.    [provider]  clindamycin (CLEOCIN) 300 MG capsule Take 1 capsule 4 (four) times daily by mouth. 12/31/16    [provider]  cyclobenzaprine (FLEXERIL) 5 MG tablet Take 1 tablet (5 mg total) at bedtime for 7 days by mouth. 01/02/17 01/09/17  Laban Emperor, PA-C  enoxaparin (LOVENOX) 40 MG/0.4ML injection Inject 0.4 mLs (40 mg total) into the skin daily. Patient not taking: Reported on 12/23/2016 08/17/15   [name], DPM  ferrous sulfate 325 (65 FE) MG tablet Take 325 mg by mouth daily with breakfast. otc    [provider]  fexofenadine-pseudoephedrine (ALLEGRA-D 24) 180-240 MG 24 hr tablet Take by mouth. otc 05/01/15   [provider]  gabapentin (NEURONTIN) 300 MG capsule TAKE 2 CAPSULES BY MOUTH NIGHTLY/ Lateef 06/14/15   [provider]  hydrALAZINE (APRESOLINE) 50 MG tablet Take 1 tablet (50 mg total) by mouth 2 (two) times daily. 12/23/16   Juline Patch, MD  HYDROcodone-acetaminophen (NORCO) 7.5-325 MG tablet Take 1 tablet by mouth 2 (two) times daily as needed for severe pain. For Chronic Pain DNF 11/26/16, 12/26/16 Patient taking differently: Take 1 tablet by mouth 2 (two) times daily as needed for severe pain. For Chronic Pain DNF 11/26/16, 12/26/16/ Lateef 11/18/16   Gillis Santa, MD  ibuprofen (ADVIL,MOTRIN) 200 MG tablet Take 400 mg by mouth as needed. otc    [provider]  ketoconazole (NIZORAL) 2 % cream U UTD QHS 11/22/14   [provider]  levothyroxine (SYNTHROID) 200 MCG tablet Take 1 tablet (200 mcg total) by mouth daily before breakfast. 12/23/16   Juline Patch, MD  lidocaine (XYLOCAINE) 5 % ointment Lateef 11/14/14   [provider]  meloxicam (MOBIC) 15 MG tablet Take 1 tablet (15 mg total) by mouth daily. Patient not taking: Reported on 01/02/2017 02/21/15   Cuthriell, Charline Bills, PA-C  methylPREDNISolone (MEDROL DOSEPAK) 4 MG TBPK tablet TK PO UTD 12/31/16   [provider]  Multiple Vitamins-Calcium (ONE-A-DAY WOMENS FORMULA PO) Take 1 tablet by mouth daily.    [provider]  mupirocin cream  (BACTROBAN) 2 % Apply 1 application topically 2 (two) times daily. 07/05/16   Johnn Hai, PA-C  nystatin cream (MYCOSTATIN) Apply topically. 03/19/16 03/19/17  [provider]  omega-3 acid ethyl esters (LOVAZA) 1 g capsule Take 2 capsules (2 g total) by mouth 2 (two) times daily. 12/23/16   Juline Patch, MD  omeprazole (PRILOSEC) 40 MG capsule Take 1 capsule (40 mg total) by mouth daily. 12/23/16   Juline Patch, MD  Polyethyl Glycol-Propyl Glycol (SYSTANE OP) Apply to eye.    [provider]  polyethylene glycol (MIRALAX / GLYCOLAX) packet Take 17 g by mouth daily. 08/07/15   [name], DPM  potassium chloride SA (K-DUR,KLOR-CON) 20 MEQ tablet Take 1 tablet (20 mEq total) by mouth daily. 12/23/16   Juline Patch, MD  predniSONE (DELTASONE) 10 MG tablet Take 6 tablets on day 1, take 5 tablets on day 2, take 4 tablets on day 3, take 3 tablets on day 4, take 2 tablets on day 5, take 1 tablet on day 6 01/02/17   Laban Emperor, PA-C  ranitidine (ZANTAC) 150 MG tablet Take 1 tablet (150 mg total) by mouth 2 (two) times daily. 12/23/16   Juline Patch, MD  tiZANidine (ZANAFLEX) 4 MG capsule Take 1 capsule (4 mg total) by mouth 2 (two) times daily as needed. 10/14/16   Gillis Santa, MD  triamcinolone cream (KENALOG) 0.5 % Apply topically. 08/18/16   [provider]  triamterene-hydrochlorothiazide (MAXZIDE) 75-50 MG tablet Take 1 tablet by mouth daily. 12/23/16   Juline Patch, MD  trimethoprim-polymyxin b Mayra Neer) ophthalmic solution  10/13/14   [provider]    Allergies Ampicillin; Penicillins; and Vibramycin [doxycycline calcium]  Family History  Problem Relation Age of Onset  . COPD Sister   . Cancer Maternal Aunt        breast  . Cancer Maternal Uncle        kidney    Social History Social History   Tobacco Use  . Smoking status: Former Smoker    Last attempt to quit: 02/24/1993    Years since quitting: 23.8  . Smokeless  tobacco: Never Used  Substance Use Topics  . Alcohol use: No    Alcohol/week: 0.0 oz  . Drug use: No     Review of Systems  Constitutional: No fever/chills Cardiovascular: No chest pain. Respiratory: No SOB. Gastrointestinal: No abdominal pain.  No nausea, no vomiting.  Musculoskeletal: Positive for arm pain. Skin: Negative for rash, abrasions, lacerations, ecchymosis. Neurological: Negative for headaches, numbness    ____________________________________________   PHYSICAL EXAM:  VITAL SIGNS: ED Triage Vitals  Enc Vitals Group     BP 01/02/17 1722 (!) 153/88     Pulse Rate 01/02/17 1722 (!) 56     Resp 01/02/17 1722  18     Temp 01/02/17 1722 98.6 F (37 C)     Temp Source 01/02/17 1722 Oral     SpO2 01/02/17 1722 100 %     Weight 01/02/17 1723 255 lb (115.7 kg)     Height 01/02/17 1723 5\' 11"  (1.803 m)     Head Circumference --      Peak Flow --      Pain Score 01/02/17 1722 9     Pain Loc --      Pain Edu? --      Excl. in [place]? --      Constitutional: Alert and oriented. Well appearing and in no acute distress. Eyes: Conjunctivae are normal. PERRL. EOMI. Head: Atraumatic. ENT:      Ears:      Nose: No congestion/rhinnorhea.      Mouth/Throat: Mucous membranes are moist.  Neck: No stridor.  No cervical spine tenderness to palpation. Cardiovascular: Normal rate, regular rhythm.  Good peripheral circulation. Respiratory: Normal respiratory effort without tachypnea or retractions. Lungs CTAB. Good air entry to the bases with no decreased or absent breath sounds. Gastrointestinal: Bowel sounds 4 quadrants. Soft and nontender to palpation. No guarding or rigidity. No palpable masses. No distention.  Musculoskeletal: Full range of motion to all extremities. No gross deformities appreciated.  Pain with abduction of right shoulder.  Tenderness to palpation diffusely over right shoulder and biceps. Neurologic:  Normal speech and language. No gross focal neurologic  deficits are appreciated.  Skin:  Skin is warm, dry and intact. No rash noted.   ____________________________________________   LABS (all labs ordered are listed, but only abnormal results are displayed)  Labs Reviewed - No data to display ____________________________________________  EKG   ____________________________________________  RADIOLOGY   Dg Cervical Spine 2-3 Views  Result Date: 01/02/2017 CLINICAL DATA:  Chronic neck pain EXAM: CERVICAL SPINE - 2-3 VIEW COMPARISON:  None. FINDINGS: Intact craniocervical relationship. Intact atlantodental interval. Maintained cervical lordosis with mild disc space narrowing C5-6. Mild multilevel degenerative facet arthropathy more pronounced at C7-T1. Mild degenerative anterolisthesis grade 1 of C7 on T1. No splaying of the lateral masses of C1 on C2. IMPRESSION: 1. Mild disc space narrowing C5-6. 2. Facet arthropathy most pronounced at C7-T1 with resultant mild degenerative grade 1 anterolisthesis of C7 on T1. 3. No acute fracture noted. Electronically Signed   By:  Royalty M.D.   On: 01/02/2017 18:22   Dg Shoulder Right  Result Date: 01/02/2017 CLINICAL DATA:  Right arm and shoulder pain x2 weeks from pneumococcal vaccine. EXAM: RIGHT SHOULDER - 2+ VIEW COMPARISON:  None. FINDINGS: No acute fracture nor joint dislocation. Mild degenerative subcortical cystic change of the superolateral humeral head with osteoarthritis of the adjacent AC joint. The adjacent ribs and lung are nonacute. No intra-articular loose bodies. Atherosclerosis of the right extracranial carotid artery. No soft tissue mass or mineralization. IMPRESSION: Degenerative changes of the right shoulder without acute osseous abnormality. Electronically Signed   By:  Royalty M.D.   On: 01/02/2017 18:20   US Venous Img Upper Uni Right  Result Date: 01/02/2017 CLINICAL DATA:  Right arm pain for 2 weeks. EXAM: RIGHT UPPER EXTREMITY VENOUS DOPPLER ULTRASOUND TECHNIQUE:  Gray-scale sonography with graded compression, as well as color Doppler and duplex ultrasound were performed to evaluate the upper extremity deep venous system from the level of the subclavian vein and including the jugular, axillary, basilic, radial, ulnar and upper cephalic vein. Spectral Doppler was utilized to evaluate flow at rest  and with distal augmentation maneuvers. COMPARISON:  None. FINDINGS: Contralateral Subclavian Vein: Respiratory phasicity is normal and symmetric with the symptomatic side. No evidence of thrombus. Normal compressibility. Internal Jugular Vein: No evidence of thrombus. Normal compressibility, respiratory phasicity and response to augmentation. Subclavian Vein: No evidence of thrombus. Normal compressibility, respiratory phasicity and response to augmentation. Axillary Vein: No evidence of thrombus. Normal compressibility, respiratory phasicity and response to augmentation. Cephalic Vein: No evidence of thrombus. Normal compressibility, respiratory phasicity and response to augmentation. Basilic Vein: No evidence of thrombus. Normal compressibility, respiratory phasicity and response to augmentation. Brachial Veins: No evidence of thrombus. Normal compressibility, respiratory phasicity and response to augmentation. Radial Veins: No evidence of thrombus. Normal compressibility, respiratory phasicity and response to augmentation. Ulnar Veins: No evidence of thrombus. Normal compressibility, respiratory phasicity and response to augmentation. Venous Reflux:  None visualized. Other Findings:  None visualized. IMPRESSION: No evidence of DVT within the right upper extremity. Electronically Signed   By: Dorise Bullion III M.D   On: 01/02/2017 19:02    ____________________________________________    PROCEDURES  Procedure(s) performed:    Procedures    Medications - No data to display   ____________________________________________   INITIAL IMPRESSION / ASSESSMENT AND  PLAN / ED COURSE  Pertinent labs & imaging results that were available during my care of the patient were reviewed by me and considered in my medical decision making (see chart for details).  Review of the [place] CSRS was performed in accordance of the [place] prior to dispensing any controlled drugs.    Patient's diagnosis is consistent with osteoarthritis and radiculopathy.  Vital signs and exam are reassuring.  Cervical x-ray and shoulder x-ray consistent with osteoarthritis.  No DVT on ultrasound.  Patient was given prednisone and Flexeril.  Patient is to follow up with PCP as directed. Patient is given ED precautions to return to the ED for any worsening or new symptoms.     ____________________________________________  FINAL CLINICAL IMPRESSION(S) / ED DIAGNOSES  Final diagnoses:  Arm pain  Radiculopathy, unspecified spinal region  Osteoarthritis of multiple joints, unspecified osteoarthritis type      NEW MEDICATIONS STARTED DURING THIS VISIT:  This SmartLink is deprecated. Use AVSMEDLIST instead to display the medication list for a patient.      This chart was dictated using voice recognition software/Dragon. Despite best efforts to proofread, errors can occur which can change the meaning. Any change was purely unintentional.    Laban Emperor, PA-C 01/03/17 0002    Eula Listen, MD 01/05/17 2152

## 2017-01-02 NOTE — ED Notes (Signed)
Pt. Verbalizes understanding of d/c instructions, medications, and follow-up. VS stable.  Pt. In NAD at time of d/c and denies further concerns regarding this visit. Pt. Stable at the time of departure from the unit, departing unit by the safest and most appropriate manner per that pt condition and limitations with all belongings accounted for. Pt advised to return to the ED at any time for emergent concerns, or for new/worsening symptoms.   

## 2017-01-02 NOTE — Progress Notes (Addendum)
Name: Michele Moore   MRN: [id number]    DOB: 10/08/1950   Date:01/02/2017       Progress Note  Subjective  Chief Complaint  Chief Complaint  Patient presents with  . Arm Pain    R) arm pain- hurts after "getting a shot"- taking clindamycin 300mg  4 times a day as well as a pred pack x 2 days    Arm Pain   The incident occurred more than 1 week ago (last week). Injury mechanism: after  that shot. The pain is present in the right fingers, right elbow, right forearm, right shoulder, right hand, right wrist and upper right arm. The quality of the pain is described as aching. The pain is at a severity of 8/10. The pain is moderate. The pain has been fluctuating since the incident. Pertinent negatives include no chest pain, muscle weakness, numbness or tingling. Nothing aggravates the symptoms. Treatments tried: steroid/clindamycin. The treatment provided no relief.  Neck Pain   This is a new problem. The current episode started 1 to 4 weeks ago (2 weeks). The problem has been gradually worsening. The quality of the pain is described as aching. The pain is moderate. The symptoms are aggravated by twisting (turning neck to left). Pertinent negatives include no chest pain, fever, headaches, numbness, tingling or weight loss. Treatments tried: norco. The treatment provided moderate relief.    No problem-specific Assessment & Plan notes found for this encounter.   Past Medical History:  Diagnosis Date  . Anemia   . Arthritis   . Chronic kidney disease    STAGE 3 PER DR EASON 08/02/15  . Family history of adverse reaction to anesthesia    sister difficult to put to sleep  . GERD (gastroesophageal reflux disease)   . Hemorrhoid   . History of hiatal hernia   . Hypertension   . Migraines   . Mixed hyperlipidemia   . Multiple gastric ulcers   . Thyroid disease     Past Surgical History:  Procedure Laterality Date  . ANKLE SURGERY    . CARPAL TUNNEL RELEASE     x3  . COLONOSCOPY  2000?  Marland Kitchen  JOINT REPLACEMENT     knee x 3 ,right x1 and left x2  . ROTATOR CUFF REPAIR    . TONSILLECTOMY    . TUBAL LIGATION    . UPPER GI ENDOSCOPY  2000?    Family History  Problem Relation Age of Onset  . COPD Sister   . Cancer Maternal Aunt        breast  . Cancer Maternal Uncle        kidney    Social History   Socioeconomic History  . Marital status: Divorced    Spouse name: Not on file  . Number of children: Not on file  . Years of education: Not on file  . Highest education level: Not on file  Social Needs  . Financial resource strain: Not on file  . Food insecurity - worry: Not on file  . Food insecurity - inability: Not on file  . Transportation needs - medical: Not on file  . Transportation needs - non-medical: Not on file  Occupational History  . Not on file  Tobacco Use  . Smoking status: Former Smoker    Last attempt to quit: 02/24/1993    Years since quitting: 23.8  . Smokeless tobacco: Never Used  Substance and Sexual Activity  . Alcohol use: No    Alcohol/week: 0.0 oz  .  Drug use: No  . Sexual activity: Not on file  Other Topics Concern  . Not on file  Social History Narrative  . Not on file    Allergies  Allergen Reactions  . Ampicillin Swelling  . Penicillins Anaphylaxis  . Vibramycin [Doxycycline Calcium] Rash    Outpatient Medications Prior to Visit  Medication Sig Dispense Refill  . aspirin 81 MG tablet Take 81 mg by mouth daily.    . clindamycin (CLEOCIN) 300 MG capsule Take 1 capsule 4 (four) times daily by mouth.  0  . ferrous sulfate 325 (65 FE) MG tablet Take 325 mg by mouth daily with breakfast. otc    . fexofenadine-pseudoephedrine (ALLEGRA-D 24) 180-240 MG 24 hr tablet Take by mouth. otc    . gabapentin (NEURONTIN) 300 MG capsule TAKE 2 CAPSULES BY MOUTH NIGHTLY/ Lateef    . hydrALAZINE (APRESOLINE) 50 MG tablet Take 1 tablet (50 mg total) by mouth 2 (two) times daily. 60 tablet 5  . HYDROcodone-acetaminophen (NORCO) 7.5-325 MG tablet  Take 1 tablet by mouth 2 (two) times daily as needed for severe pain. For Chronic Pain DNF 11/26/16, 12/26/16 (Patient taking differently: Take 1 tablet by mouth 2 (two) times daily as needed for severe pain. For Chronic Pain DNF 11/26/16, 12/26/16/ Lateef) 60 tablet 0  . ibuprofen (ADVIL,MOTRIN) 200 MG tablet Take 400 mg by mouth as needed. otc    . ketoconazole (NIZORAL) 2 % cream U UTD QHS    . levothyroxine (SYNTHROID) 200 MCG tablet Take 1 tablet (200 mcg total) by mouth daily before breakfast. 30 tablet 5  . lidocaine (XYLOCAINE) 5 % ointment Lateef    . methylPREDNISolone (MEDROL DOSEPAK) 4 MG TBPK tablet TK PO UTD  0  . Multiple Vitamins-Calcium (ONE-A-DAY WOMENS FORMULA PO) Take 1 tablet by mouth daily.    . mupirocin cream (BACTROBAN) 2 % Apply 1 application topically 2 (two) times daily. 15 g 0  . nystatin cream (MYCOSTATIN) Apply topically.    Marland Kitchen omega-3 acid ethyl esters (LOVAZA) 1 g capsule Take 2 capsules (2 g total) by mouth 2 (two) times daily. 120 capsule 5  . omeprazole (PRILOSEC) 40 MG capsule Take 1 capsule (40 mg total) by mouth daily. 30 capsule 5  . Polyethyl Glycol-Propyl Glycol (SYSTANE OP) Apply to eye.    . polyethylene glycol (MIRALAX / GLYCOLAX) packet Take 17 g by mouth daily. 14 each 0  . potassium chloride SA (K-DUR,KLOR-CON) 20 MEQ tablet Take 1 tablet (20 mEq total) by mouth daily. 30 tablet 5  . ranitidine (ZANTAC) 150 MG tablet Take 1 tablet (150 mg total) by mouth 2 (two) times daily. 60 tablet 5  . tiZANidine (ZANAFLEX) 4 MG capsule Take 1 capsule (4 mg total) by mouth 2 (two) times daily as needed. 60 capsule 2  . triamcinolone cream (KENALOG) 0.5 % Apply topically.    . triamterene-hydrochlorothiazide (MAXZIDE) 75-50 MG tablet Take 1 tablet by mouth daily. 30 tablet 5  . trimethoprim-polymyxin b (POLYTRIM) ophthalmic solution     . enoxaparin (LOVENOX) 40 MG/0.4ML injection Inject 0.4 mLs (40 mg total) into the skin daily. (Patient not taking: Reported on  12/23/2016) 9 Syringe 0  . meloxicam (MOBIC) 15 MG tablet Take 1 tablet (15 mg total) by mouth daily. (Patient not taking: Reported on 01/02/2017) 30 tablet 0   No facility-administered medications prior to visit.     Review of Systems  Constitutional: Negative for chills, fever, malaise/fatigue and weight loss.  HENT: Negative for ear discharge,  ear pain and sore throat.   Eyes: Negative for blurred vision.  Respiratory: Negative for cough, sputum production, shortness of breath and wheezing.   Cardiovascular: Negative for chest pain, palpitations and leg swelling.  Gastrointestinal: Negative for abdominal pain, blood in stool, constipation, diarrhea, heartburn, melena and nausea.  Genitourinary: Negative for dysuria, frequency, hematuria and urgency.  Musculoskeletal: Positive for joint pain, myalgias and neck pain. Negative for back pain.  Skin: Negative for rash.  Neurological: Negative for dizziness, tingling, sensory change, focal weakness, numbness and headaches.  Endo/Heme/Allergies: Negative for environmental allergies and polydipsia. Does not bruise/bleed easily.  Psychiatric/Behavioral: Negative for depression and suicidal ideas. The patient is not nervous/anxious and does not have insomnia.      Objective  Vitals:   01/02/17 1402  BP: 130/70  Pulse: 68  Temp: 98.1 F (36.7 C)  Weight: 255 lb (115.7 kg)  Height: 5\' 11"  (1.803 m)    Physical Exam  Constitutional: She is well-developed, well-nourished, and in no distress. No distress.  HENT:  Head: Normocephalic and atraumatic.  Right Ear: External ear normal.  Left Ear: External ear normal.  Nose: Nose normal.  Mouth/Throat: Oropharynx is clear and moist.  Eyes: Conjunctivae and EOM are normal. Pupils are equal, round, and reactive to light. Right eye exhibits no discharge. Left eye exhibits no discharge.  Neck: Normal range of motion. Neck supple. No JVD present. No thyromegaly present.  Cardiovascular: Normal  rate, regular rhythm, normal heart sounds, intact distal pulses and normal pulses. Exam reveals no gallop and no friction rub.  No murmur heard. Pulses:      Radial pulses are 2+ on the right side, and 2+ on the left side.  Pulmonary/Chest: Effort normal and breath sounds normal. She has no wheezes. She has no rales.  Abdominal: Soft. Bowel sounds are normal. She exhibits no mass. There is no tenderness. There is no guarding.  Musculoskeletal: She exhibits no edema.       Right shoulder: She exhibits decreased range of motion, tenderness and bony tenderness. She exhibits no swelling, no effusion, no spasm, normal pulse and normal strength.  Generalized right shoulder jt tenderness /minimal deltoid/tricep tenderness  Lymphadenopathy:    She has no cervical adenopathy.  Neurological: She is alert. She has normal sensation, normal strength and normal reflexes. She displays no weakness. No sensory deficit.  Skin: Skin is warm and dry. No rash noted. She is not diaphoretic.  Psychiatric: Mood and affect normal.  Nursing note and vitals reviewed.     Assessment & Plan  Problem List Items Addressed This Visit    None    Visit Diagnoses    Arthropathy    -  Primary   right shoulder/patient desires to go to emergency room.   Cervical radiculopathy          No orders of the defined types were placed in this encounter.     Dr. Macon [facility]  01/02/17

## 2017-01-02 NOTE — ED Triage Notes (Signed)
Pt from [facility] with arm pain x 2 weeks from pnueumococcal shot 2 weeks ago. Pt states she has chronic pain in her neck, back, legs, ankles. She states that her pcp put her on clindamycin 3 days ago for the "knot" in her arm and the pain. Pt has long list of complaints, mostly related to chronic pain. Pt alert & oriented with NAD noted.

## 2017-01-22 ENCOUNTER — Ambulatory Visit
Payer: Medicare Other | Attending: Student in an Organized Health Care Education/Training Program | Admitting: Student in an Organized Health Care Education/Training Program

## 2017-01-22 ENCOUNTER — Encounter: Payer: Self-pay | Admitting: Student in an Organized Health Care Education/Training Program

## 2017-01-22 ENCOUNTER — Other Ambulatory Visit: Payer: Self-pay

## 2017-01-22 VITALS — BP 147/104 | HR 100 | Temp 98.7°F | Resp 18 | Ht 71.0 in | Wt 255.0 lb

## 2017-01-22 DIAGNOSIS — K219 Gastro-esophageal reflux disease without esophagitis: Secondary | ICD-10-CM | POA: Insufficient documentation

## 2017-01-22 DIAGNOSIS — G894 Chronic pain syndrome: Secondary | ICD-10-CM | POA: Insufficient documentation

## 2017-01-22 DIAGNOSIS — M25511 Pain in right shoulder: Secondary | ICD-10-CM | POA: Insufficient documentation

## 2017-01-22 DIAGNOSIS — E079 Disorder of thyroid, unspecified: Secondary | ICD-10-CM | POA: Diagnosis not present

## 2017-01-22 DIAGNOSIS — N189 Chronic kidney disease, unspecified: Secondary | ICD-10-CM | POA: Insufficient documentation

## 2017-01-22 DIAGNOSIS — Z880 Allergy status to penicillin: Secondary | ICD-10-CM | POA: Insufficient documentation

## 2017-01-22 DIAGNOSIS — Z79890 Hormone replacement therapy (postmenopausal): Secondary | ICD-10-CM | POA: Diagnosis not present

## 2017-01-22 DIAGNOSIS — M47812 Spondylosis without myelopathy or radiculopathy, cervical region: Secondary | ICD-10-CM | POA: Diagnosis not present

## 2017-01-22 DIAGNOSIS — Z79899 Other long term (current) drug therapy: Secondary | ICD-10-CM | POA: Diagnosis not present

## 2017-01-22 DIAGNOSIS — Z7982 Long term (current) use of aspirin: Secondary | ICD-10-CM | POA: Diagnosis not present

## 2017-01-22 DIAGNOSIS — E782 Mixed hyperlipidemia: Secondary | ICD-10-CM | POA: Diagnosis not present

## 2017-01-22 DIAGNOSIS — I129 Hypertensive chronic kidney disease with stage 1 through stage 4 chronic kidney disease, or unspecified chronic kidney disease: Secondary | ICD-10-CM | POA: Insufficient documentation

## 2017-01-22 DIAGNOSIS — Z5181 Encounter for therapeutic drug level monitoring: Secondary | ICD-10-CM | POA: Diagnosis not present

## 2017-01-22 DIAGNOSIS — Z87891 Personal history of nicotine dependence: Secondary | ICD-10-CM | POA: Insufficient documentation

## 2017-01-22 MED ORDER — KETOROLAC TROMETHAMINE 30 MG/ML IJ SOLN
30.0000 mg | Freq: Once | INTRAMUSCULAR | Status: AC
  Administered 2017-01-22: 30 mg via INTRAMUSCULAR

## 2017-01-22 MED ORDER — TIZANIDINE HCL 4 MG PO CAPS: 4.0000 mg | ORAL_CAPSULE | Freq: Two times a day (BID) | ORAL | 2 refills | Status: DC | PRN

## 2017-01-22 MED ORDER — HYDROCODONE-ACETAMINOPHEN 7.5-325 MG PO TABS: 1.0000 | ORAL_TABLET | Freq: Three times a day (TID) | ORAL | 0 refills | Status: DC | PRN

## 2017-01-22 MED ORDER — ORPHENADRINE CITRATE 30 MG/ML IJ SOLN
30.0000 mg | Freq: Once | INTRAMUSCULAR | Status: AC
  Administered 2017-01-22: 30 mg via INTRAMUSCULAR

## 2017-01-22 MED ORDER — ORPHENADRINE CITRATE 30 MG/ML IJ SOLN
INTRAMUSCULAR | Status: AC
  Filled 2017-01-22: qty 2

## 2017-01-22 MED ORDER — GABAPENTIN 300 MG PO CAPS: ORAL_CAPSULE | ORAL | 1 refills | Status: DC

## 2017-01-22 MED ORDER — KETOROLAC TROMETHAMINE 30 MG/ML IJ SOLN
INTRAMUSCULAR | Status: AC
  Filled 2017-01-22: qty 1

## 2017-01-22 NOTE — Progress Notes (Signed)
Safety precautions to be maintained throughout the outpatient stay will include: orient to surroundings, keep bed in low position, maintain call bell within reach at all times, provide assistance with transfer out of bed and ambulation.  

## 2017-01-22 NOTE — Progress Notes (Signed)
Patient's Name: Michele Moore  MRN: [id number]  Referring Provider: Marden Noble, MD  DOB: 07-18-1950  PCP: Juline Patch, MD  DOS: 01/22/2017  Note by: Gillis Santa, MD  Service setting: Ambulatory outpatient  Specialty: Interventional Pain Management  Location: [facility] (AMB) Pain Management Facility    Patient type: Established   Primary Reason(s) for Visit: Encounter for prescription drug management. (Level of risk: moderate)  CC: Shoulder Pain (right -states started after pneumonia vaccine)  HPI  Michele Moore is a 66 y.o. year old, female patient, who comes today for a medication management evaluation. She has Pain, lower extremity on their problem list. Her primarily concern today is the Shoulder Pain (right -states started after pneumonia vaccine)  Pain Assessment: Location: Right Shoulder Radiating: right arm Onset: 1 to 4 weeks ago Duration: Acute pain Quality: Aching, Tender, Sharp, Shooting Severity: 10-Worst pain ever/10 (self-reported pain score)  Note: Reported level is inconsistent with clinical observations. Clinically the patient looks like a 5/10             When using our objective Pain Scale, levels between 6 and 10/10 are said to belong in an emergency room, as it progressively worsens from a 6/10, described as severely limiting, requiring emergency care not usually available at an outpatient pain management facility. At a 6/10 level, communication becomes difficult and requires great effort. Assistance to reach the emergency department may be required. Facial flushing and profuse sweating along with potentially dangerous increases in heart rate and blood pressure will be evident. Effect on ADL:   Timing: Constant Modifying factors: nothing  Michele Moore was last scheduled for an appointment on 12/01/2016 for medication management. During today's appointment we reviewed Michele Moore's chronic pain status, as well as her outpatient medication regimen.  66 year old female with a past  medical history of hypertension, migraines, osteoporosis, rheumatoid arthritis who has low back pain which radiates to the left leg.Patient's lumbar MRI is significant for severe lumbar spondylosis and degenerative disc disease causing prominent impingement at L5-S1, moderate impingement at L3-L4 and mild impingement at L4-L5, left greater than right.Patient is status post bilateral sacroiliac joint injections on 10/20/2016 which provided the patient with significant pain relief for approximately a week and moderate pain relief ongoing.  Patient is status post left C4-C7 medial branch nerve block on December 01, 2016 and presents for follow-up.  Patient notes significant improvement in her left neck, shoulder, left arm pain symptoms since diagnostic block.  However patient received a pneumococcal vaccine in her right arm and has experienced significant pain and immobility of the right arm since vaccine administration.  She has been to the emergency department and has also seen her primary care physician.  She has received antibiotics for presumed cellulitis of the right arm along with a 21-day steroid course.  Patient states that the steroids were moderately effective in helping her pain symptoms but she continues to have severely limited range of motion.  Patient holds her right arm is flexed and internally rotated.  She notes a lump on the back of her deltoid.  She has significant pain with shoulder abduction.  The patient  reports that she does not use drugs. Her body mass index is 35.57 kg/m.  Further details on both, my assessment(s), as well as the proposed treatment plan, please see below.  Controlled Substance Pharmacotherapy Assessment REMS (Risk Evaluation and Mitigation Strategy)  Analgesic: Hydrocodone 7.5 mg twice daily as needed, quantity 60 MME/day: Approximately 15 mg/day.  [name]  C, RN  01/22/2017 10:37 AM  Sign at close encounter Safety precautions to be maintained throughout  the outpatient stay will include: orient to surroundings, keep bed in low position, maintain call bell within reach at all times, provide assistance with transfer out of bed and ambulation.    Pharmacokinetics: Liberation and absorption (onset of action): WNL Distribution (time to peak effect): WNL Metabolism and excretion (duration of action): WNL         Pharmacodynamics: Desired effects: Analgesia: Michele Moore reports >50% benefit. Functional ability: Patient reports that medication allows her to accomplish basic ADLs Clinically meaningful improvement in function (CMIF): Sustained CMIF goals met Perceived effectiveness: Described as relatively effective but with some room for improvement notes significant disability of her right shoulder status post right pneumococcal vaccine Undesirable effects: Side-effects or Adverse reactions: None reported Monitoring: [place] PMP: Online review of the past 74-monthperiod conducted. Compliant with practice rules and regulations Last UDS on record: Summary  Date Value Ref Range Status  10/14/2016 FINAL  Final    Comment:    ==================================================================== [facility] COMP DRUG ANALYSIS,UR ==================================================================== Test                             Result       Flag       Units Drug Present and Declared for Prescription Verification   Gabapentin                     PRESENT      EXPECTED   Acetaminophen                  PRESENT      EXPECTED Drug Present not Declared for Prescription Verification   Dextrorphan/Levorphanol        PRESENT      UNEXPECTED    Dextrorphan is an expected metabolite of dextromethorphan, an    over-the-counter or prescription cough suppressant. Levorphanol    is a scheduled prescription medication. Dextrorphan cannot be    distinguished from levorphanol by the method used for analysis. Drug Absent but Declared for Prescription Verification    Hydrocodone                    Not Detected UNEXPECTED ng/mg creat   Oxycodone                      Not Detected UNEXPECTED ng/mg creat   Ephedrine/Pseudoephedrine      Not Detected UNEXPECTED   Tramadol                       Not Detected UNEXPECTED   Pregabalin                     Not Detected UNEXPECTED   Tizanidine                     Not Detected UNEXPECTED    Tizanidine, as indicated in the declared medication list, is not    always detected even when used as directed.   Duloxetine                     Not Detected UNEXPECTED   Salicylate                     Not Detected UNEXPECTED    Aspirin, as indicated  in the declared medication list, is not    always detected even when used as directed.   Chlorpheniramine               Not Detected UNEXPECTED   Hydroxyzine                    Not Detected UNEXPECTED   Lidocaine                      Not Detected UNEXPECTED    Lidocaine, as indicated in the declared medication list, is not    always detected even when used as directed. ==================================================================== Test                      Result    Flag   Units      Ref Range   Creatinine              101              mg/dL      >=20 ==================================================================== Declared Medications:  The flagging and interpretation on this report are based on the  following declared medications.  Unexpected results may arise from  inaccuracies in the declared medications.  **Note: The testing scope of this panel includes these medications:  Chlorpheniramine (Tussionex)  Duloxetine  Gabapentin  Hydrocodone (Hydrocodone-Acetaminophen)  Hydrocodone (Tussionex)  Hydroxyzine  Oxycodone (Oxycodone Acetaminophen)  Pregabalin  Pseudoephedrine (Fexofenadine Pseudoephedrine)  Tramadol  **Note: The testing scope of this panel does not include small to  moderate amounts of these reported medications:  Acetaminophen  Acetaminophen  (Hydrocodone-Acetaminophen)  Acetaminophen (Oxycodone Acetaminophen)  Aspirin  Lidocaine  Tizanidine  **Note: The testing scope of this panel does not include following  reported medications:  Cefuroxime axetil  Fexofenadine (Fexofenadine Pseudoephedrine)  Hydralazine  Hydrochlorothiazide (Triamterine-Hydrochlorthzide)  Ketoconazole  Levothyroxine  Meloxicam  Mupirocin  Nystatin  Omeprazole  Polyethylene Glycol  Polymyxin  Potassium  Prednisone  Ranitidine  Supplement (Omega-3)  Topical  Triamcinolone acetonide  Triamterene (Triamterine-Hydrochlorthzide)  Trimethoprim ==================================================================== For clinical consultation, please call [phone]. ====================================================================    UDS interpretation: Compliant          Medication Assessment Form: Reviewed. Patient indicates being compliant with therapy Treatment compliance: Compliant Risk Assessment Profile: Aberrant behavior: See prior evaluations. None observed or detected today Comorbid factors increasing risk of overdose: See prior notes. No additional risks detected today Risk of substance use disorder (SUD): Low Opioid Risk Tool - 01/22/17 1046      Family History of Substance Abuse   Alcohol  Negative    Illegal Drugs  Negative    Rx Drugs  Negative      Personal History of Substance Abuse   Alcohol  Negative    Illegal Drugs  Negative    Rx Drugs  Negative      Age   Age between 47-45 years   No      History of Preadolescent Sexual Abuse   History of Preadolescent Sexual Abuse  Negative or Female      Psychological Disease   Psychological Disease  Negative    Depression  Negative      Total Score   Opioid Risk Tool Scoring  0    Opioid Risk Interpretation  Low Risk      ORT Scoring interpretation table:  Score <3 = Low Risk for SUD  Score between 4-7 = Moderate Risk for SUD  Score >8 = High  Risk for Opioid Abuse    Risk Mitigation Strategies:  Patient Counseling: Covered Patient-Prescriber Agreement (PPA): Present and active  Notification to other healthcare providers: Done  Pharmacologic Plan: Increase to 7.5 mg 3 times daily as needed for twice daily as needed given severe pain of right shoulder and right arm after pneumococcal vaccine.  Laboratory Chemistry  Inflammation Markers (CRP: Acute Phase) (ESR: Chronic Phase) No results found for: CRP, ESRSEDRATE, LATICACIDVEN               Rheumatology Markers No results found for: RF, ANA, LABURIC, URICUR, LYMEIGGIGMAB, LYMEABIGMQN              Renal Function Markers Lab Results  Component Value Date   BUN 30 (H) 12/23/2016   CREATININE 1.96 (H) 12/23/2016   GFRAA 30 (L) 12/23/2016   GFRNONAA 26 (L) 12/23/2016                 Hepatic Function Markers Lab Results  Component Value Date   AST 21 12/27/2013   ALT 19 12/27/2013   ALBUMIN 4.5 12/23/2016   ALKPHOS 91 12/27/2013                 Electrolytes Lab Results  Component Value Date   NA 141 12/23/2016   K 4.5 12/23/2016   CL 103 12/23/2016   CALCIUM 9.7 12/23/2016   MG 2.2 12/27/2013   PHOS 3.0 12/23/2016                 Neuropathy Markers No results found for: VITAMINB12, FOLATE, HGBA1C, HIV               Bone Pathology Markers No results found for: VD25OH, VD125OH2TOT, RA0762UQ3, FH5456YB6, 25OHVITD1, 25OHVITD2, 25OHVITD3, TESTOFREE, TESTOSTERONE               Coagulation Parameters Lab Results  Component Value Date   PLT 150 08/03/2015                 Cardiovascular Markers Lab Results  Component Value Date   CKTOTAL 154 12/27/2013   CKMB 0.8 12/27/2013   TROPONINI < 0.02 12/27/2013   HGB 10.6 (L) 08/03/2015   HCT 32.4 (L) 08/03/2015                 CA Markers No results found for: CEA, CA125, LABCA2               Note: Lab results reviewed.  Recent Diagnostic Imaging Results  US Venous Img Upper Uni Right CLINICAL DATA:  Right arm pain for 2  weeks.  EXAM: RIGHT UPPER EXTREMITY VENOUS DOPPLER ULTRASOUND  TECHNIQUE: Gray-scale sonography with graded compression, as well as color Doppler and duplex ultrasound were performed to evaluate the upper extremity deep venous system from the level of the subclavian vein and including the jugular, axillary, basilic, radial, ulnar and upper cephalic vein. Spectral Doppler was utilized to evaluate flow at rest and with distal augmentation maneuvers.  COMPARISON:  None.  FINDINGS: Contralateral Subclavian Vein: Respiratory phasicity is normal and symmetric with the symptomatic side. No evidence of thrombus. Normal compressibility.  Internal Jugular Vein: No evidence of thrombus. Normal compressibility, respiratory phasicity and response to augmentation.  Subclavian Vein: No evidence of thrombus. Normal compressibility, respiratory phasicity and response to augmentation.  Axillary Vein: No evidence of thrombus. Normal compressibility, respiratory phasicity and response to augmentation.  Cephalic Vein: No evidence of thrombus. Normal compressibility, respiratory phasicity and response to augmentation.  Basilic Vein:  No evidence of thrombus. Normal compressibility, respiratory phasicity and response to augmentation.  Brachial Veins: No evidence of thrombus. Normal compressibility, respiratory phasicity and response to augmentation.  Radial Veins: No evidence of thrombus. Normal compressibility, respiratory phasicity and response to augmentation.  Ulnar Veins: No evidence of thrombus. Normal compressibility, respiratory phasicity and response to augmentation.  Venous Reflux:  None visualized.  Other Findings:  None visualized.  IMPRESSION: No evidence of DVT within the right upper extremity.  Electronically Signed   By: Dorise Bullion III M.D   On: 01/02/2017 19:02 DG Cervical Spine 2-3 Views CLINICAL DATA:  Chronic neck pain  EXAM: CERVICAL SPINE - 2-3  VIEW  COMPARISON:  None.  FINDINGS: Intact craniocervical relationship. Intact atlantodental interval. Maintained cervical lordosis with mild disc space narrowing C5-6. Mild multilevel degenerative facet arthropathy more pronounced at C7-T1. Mild degenerative anterolisthesis grade 1 of C7 on T1. No splaying of the lateral masses of C1 on C2.  IMPRESSION: 1. Mild disc space narrowing C5-6. 2. Facet arthropathy most pronounced at C7-T1 with resultant mild degenerative grade 1 anterolisthesis of C7 on T1. 3. No acute fracture noted.  Electronically Signed   By: Ashley Royalty M.D.   On: 01/02/2017 18:22 DG Shoulder Right CLINICAL DATA:  Right arm and shoulder pain x2 weeks from pneumococcal vaccine.  EXAM: RIGHT SHOULDER - 2+ VIEW  COMPARISON:  None.  FINDINGS: No acute fracture nor joint dislocation. Mild degenerative subcortical cystic change of the superolateral humeral head with osteoarthritis of the adjacent AC joint. The adjacent ribs and lung are nonacute. No intra-articular loose bodies. Atherosclerosis of the right extracranial carotid artery. No soft tissue mass or mineralization.  IMPRESSION: Degenerative changes of the right shoulder without acute osseous abnormality.  Electronically Signed   By: Ashley Royalty M.D.   On: 01/02/2017 18:20  Complexity Note: Imaging results reviewed. Results shared with Michele Moore, using Layman's terms.                         Meds   Current Outpatient Medications:  .  aspirin 81 MG tablet, Take 81 mg by mouth daily., Disp: , Rfl:  .  clindamycin (CLEOCIN) 300 MG capsule, Take 1 capsule 4 (four) times daily by mouth., Disp: , Rfl: 0 .  ferrous sulfate 325 (65 FE) MG tablet, Take 325 mg by mouth daily with breakfast. otc, Disp: , Rfl:  .  fexofenadine-pseudoephedrine (ALLEGRA-D 24) 180-240 MG 24 hr tablet, Take by mouth. otc, Disp: , Rfl:  .  gabapentin (NEURONTIN) 300 MG capsule, TAKE 2 CAPSULES BY MOUTH NIGHTLY/ Vonita Calloway, Disp: 60  capsule, Rfl: 1 .  hydrALAZINE (APRESOLINE) 50 MG tablet, Take 1 tablet (50 mg total) by mouth 2 (two) times daily., Disp: 60 tablet, Rfl: 5 .  HYDROcodone-acetaminophen (NORCO) 7.5-325 MG tablet, Take 1 tablet by mouth 3 (three) times daily as needed for severe pain. For Chronic Pain DNF: 01/24/17, 02/21/17, Disp: 90 tablet, Rfl: 0 .  ibuprofen (ADVIL,MOTRIN) 200 MG tablet, Take 400 mg by mouth as needed. otc, Disp: , Rfl:  .  ketoconazole (NIZORAL) 2 % cream, U UTD QHS, Disp: , Rfl:  .  levothyroxine (SYNTHROID) 200 MCG tablet, Take 1 tablet (200 mcg total) by mouth daily before breakfast., Disp: 30 tablet, Rfl: 5 .  lidocaine (XYLOCAINE) 5 % ointment, Aurelia Gras, Disp: , Rfl:  .  meloxicam (MOBIC) 15 MG tablet, Take 1 tablet (15 mg total) by mouth daily., Disp: 30 tablet, Rfl: 0 .  methylPREDNISolone (MEDROL DOSEPAK) 4 MG TBPK tablet, TK PO UTD, Disp: , Rfl: 0 .  Multiple Vitamins-Calcium (ONE-A-DAY WOMENS FORMULA PO), Take 1 tablet by mouth daily., Disp: , Rfl:  .  mupirocin cream (BACTROBAN) 2 %, Apply 1 application topically 2 (two) times daily., Disp: 15 g, Rfl: 0 .  nystatin cream (MYCOSTATIN), Apply topically., Disp: , Rfl:  .  omega-3 acid ethyl esters (LOVAZA) 1 g capsule, Take 2 capsules (2 g total) by mouth 2 (two) times daily., Disp: 120 capsule, Rfl: 5 .  omeprazole (PRILOSEC) 40 MG capsule, Take 1 capsule (40 mg total) by mouth daily., Disp: 30 capsule, Rfl: 5 .  Polyethyl Glycol-Propyl Glycol (SYSTANE OP), Apply to eye., Disp: , Rfl:  .  polyethylene glycol (MIRALAX / GLYCOLAX) packet, Take 17 g by mouth daily., Disp: 14 each, Rfl: 0 .  potassium chloride SA (K-DUR,KLOR-CON) 20 MEQ tablet, Take 1 tablet (20 mEq total) by mouth daily., Disp: 30 tablet, Rfl: 5 .  predniSONE (DELTASONE) 10 MG tablet, Take 6 tablets on day 1, take 5 tablets on day 2, take 4 tablets on day 3, take 3 tablets on day 4, take 2 tablets on day 5, take 1 tablet on day 6, Disp: 21 tablet, Rfl: 0 .  ranitidine  (ZANTAC) 150 MG tablet, Take 1 tablet (150 mg total) by mouth 2 (two) times daily., Disp: 60 tablet, Rfl: 5 .  tiZANidine (ZANAFLEX) 4 MG capsule, Take 1 capsule (4 mg total) by mouth 2 (two) times daily as needed., Disp: 60 capsule, Rfl: 2 .  triamcinolone cream (KENALOG) 0.5 %, Apply topically., Disp: , Rfl:  .  triamterene-hydrochlorothiazide (MAXZIDE) 75-50 MG tablet, Take 1 tablet by mouth daily., Disp: 30 tablet, Rfl: 5 .  trimethoprim-polymyxin b (POLYTRIM) ophthalmic solution, , Disp: , Rfl:  .  enoxaparin (LOVENOX) 40 MG/0.4ML injection, Inject 0.4 mLs (40 mg total) into the skin daily. (Patient not taking: Reported on 12/23/2016), Disp: 9 Syringe, Rfl: 0  ROS  Constitutional: Denies any fever or chills Gastrointestinal: No reported hemesis, hematochezia, vomiting, or acute GI distress Musculoskeletal: Denies any acute onset joint swelling, redness, loss of ROM, or weakness Neurological: No reported episodes of acute onset apraxia, aphasia, dysarthria, agnosia, amnesia, paralysis, loss of coordination, or loss of consciousness  Allergies  Michele Moore is allergic to ampicillin; penicillins; and vibramycin [doxycycline calcium].  PFSH  Drug: Michele Moore  reports that she does not use drugs. Alcohol:  reports that she does not drink alcohol. Tobacco:  reports that she quit smoking about 23 years ago. she has never used smokeless tobacco. Medical:  has a past medical history of Anemia, Arthritis, Chronic kidney disease, Family history of adverse reaction to anesthesia, GERD (gastroesophageal reflux disease), Hemorrhoid, History of hiatal hernia, Hypertension, Migraines, Mixed hyperlipidemia, Multiple gastric ulcers, and Thyroid disease. Surgical: Michele Moore  has a past surgical history that includes Carpal tunnel release; Rotator cuff repair; Ankle surgery; Tubal ligation; Colonoscopy (2000?); Upper gi endoscopy (2000?); Joint replacement; Tonsillectomy; and Fusion of talonavicular joint (Right,  08/03/2015). Family: family history includes COPD in her sister; Cancer in her maternal aunt and maternal uncle.  Constitutional Exam  General appearance: Well nourished, well developed, and well hydrated. In no apparent acute distress Vitals:   01/22/17 1039  BP: (!) 147/104  Pulse: 100  Resp: 18  Temp: 98.7 F (37.1 C)  TempSrc: Oral  SpO2: 100%  Weight: 255 lb (115.7 kg)  Height: _0  (1.803 m)   BMI Assessment: Estimated body mass  index is 35.57 kg/m as calculated from the following:   Height as of this encounter: _0  (1.803 m).   Weight as of this encounter: 255 lb (115.7 kg).  BMI interpretation table: BMI level Category Range association with higher incidence of chronic pain  <18 kg/m2 Underweight   18.5-24.9 kg/m2 Ideal body weight   25-29.9 kg/m2 Overweight Increased incidence by 20%  30-34.9 kg/m2 Obese (Class I) Increased incidence by 68%  35-39.9 kg/m2 Severe obesity (Class II) Increased incidence by 136%  >40 kg/m2 Extreme obesity (Class III) Increased incidence by 254%   BMI Readings from Last 4 Encounters:  01/22/17 35.57 kg/m  01/02/17 35.57 kg/m  01/02/17 35.57 kg/m  12/23/16 35.43 kg/m   Wt Readings from Last 4 Encounters:  01/22/17 255 lb (115.7 kg)  01/02/17 255 lb (115.7 kg)  01/02/17 255 lb (115.7 kg)  12/23/16 254 lb (115.2 kg)  Psych/Mental status: Alert, oriented x 3 (person, place, & time)       Eyes: PERLA Respiratory: No evidence of acute respiratory distress  Cervical Spine Area Exam  Skin & Axial Inspection: No masses, redness, edema, swelling, or associated skin lesions Alignment: Symmetrical Functional ROM: Unrestricted ROM      Stability: No instability detected Muscle Tone/Strength: Functionally intact. No obvious neuro-muscular anomalies detected. Sensory (Neurological): Unimpaired Palpation: No palpable anomalies              Upper Extremity (UE) Exam    Side: Right upper extremity  Side: Left upper extremity  Skin &  Extremity Inspection: Edema  Skin & Extremity Inspection: Skin color, temperature, and hair growth are WNL. No peripheral edema or cyanosis. No masses, redness, swelling, asymmetry, or associated skin lesions. No contractures.  Functional ROM: Decreased ROM for shoulder and elbow shoulder greater than elbow  Functional ROM: Unrestricted ROM          Muscle Tone/Strength: Movement possible against some resistance (4/5) limited by pain  Muscle Tone/Strength: Functionally intact. No obvious neuro-muscular anomalies detected.  Sensory (Neurological): Myotome pain pattern          Sensory (Neurological): Unimpaired          Palpation: Complains of area being tender to palpation             Overlying the right deltoid and right bicep  Palpation: No palpable anomalies              Specialized Test(s): Deferred         Specialized Test(s): Deferred          Thoracic Spine Area Exam  Skin & Axial Inspection: No masses, redness, or swelling Alignment: Symmetrical Functional ROM: Unrestricted ROM Stability: No instability detected Muscle Tone/Strength: Functionally intact. No obvious neuro-muscular anomalies detected. Sensory (Neurological): Unimpaired Muscle strength & Tone: No palpable anomalies  Lumbar Spine Area Exam  Skin & Axial Inspection: No masses, redness, or swelling Alignment: Symmetrical Functional ROM: Unrestricted ROM      Stability: No instability detected Muscle Tone/Strength: Functionally intact. No obvious neuro-muscular anomalies detected. Sensory (Neurological): Unimpaired Palpation: No palpable anomalies       Provocative Tests: Lumbar Hyperextension and rotation test: evaluation deferred today       Lumbar Lateral bending test: evaluation deferred today       Patrick's Maneuver: evaluation deferred today                    Gait & Posture Assessment  Ambulation: Unassisted Gait: Relatively normal for age and body  habitus Posture: WNL   Lower Extremity Exam    Side: Right  lower extremity  Side: Left lower extremity  Skin & Extremity Inspection: Skin color, temperature, and hair growth are WNL. No peripheral edema or cyanosis. No masses, redness, swelling, asymmetry, or associated skin lesions. No contractures.  Skin & Extremity Inspection: Skin color, temperature, and hair growth are WNL. No peripheral edema or cyanosis. No masses, redness, swelling, asymmetry, or associated skin lesions. No contractures.  Functional ROM: Unrestricted ROM          Functional ROM: Unrestricted ROM          Muscle Tone/Strength: Functionally intact. No obvious neuro-muscular anomalies detected.  Muscle Tone/Strength: Functionally intact. No obvious neuro-muscular anomalies detected.  Sensory (Neurological): Unimpaired  Sensory (Neurological): Unimpaired  Palpation: No palpable anomalies  Palpation: No palpable anomalies   Assessment  Primary Diagnosis & Pertinent Problem List: The primary encounter diagnosis was Cervical spondylosis without myelopathy. Diagnoses of Cervical facet syndrome and Chronic pain syndrome were also pertinent to this visit.  Status Diagnosis  Controlled Controlled Controlled 1. Cervical spondylosis without myelopathy   2. Cervical facet syndrome   3. Chronic pain syndrome       66 year old female with a history of chronic pain syndrome secondary to SI joint arthritis and cervical spondylosis most pronounced on the left status post left C4-C7 medial branch nerve block with significant improvement in her left neck and left shoulder pain symptoms.  Note patient received a pneumococcal vaccine in her right shoulder and has experienced significant pain and disability since the injection to the point that she has had an emergency department visit along with imaging of her right shoulder which did not show any acute intraosseous process.  She is completed a course of antibiotics for presumed cellulitis along with a steroid taper for her right shoulder pain that is  significant.  She also has limited right shoulder range of motion.  It is unclear why the patient is having such significant pain and limited range of motion as a result of the pneumococcal vaccine but this is likely a local medication side effect.  On the other hand I am pleased to hear that her left neck pain and left shoulder symptoms have improved since our cervical medial branch block.  Given the patient's discomfort and worsening right shoulder pain, will provide intramuscular injections of Toradol and Norflex today, 30 mg respectively.  Furthermore I will increase her Norco from 7.5 mg twice daily to 3 times daily as needed to help with her right shoulder pain.  I will also provide the patient refills of gabapentin and tizanidine that she can continue.  Plan: -Increase hydrocodone from 7.5 mg twice daily to 3 times daily as needed.  Provided for 2 months.  Plan will be to reduce to previous dose after right shoulder pain improves. -Continue gabapentin 600 mg nightly, Zanaflex 4 mg twice daily as needed muscle spasms -Intramuscular Norflex 30 mg, Toradol 30 mg today. -Follow-up in 2 months.   Plan of Care  Pharmacotherapy (Medications Ordered): Meds ordered this encounter  Medications  . DISCONTD: HYDROcodone-acetaminophen (NORCO) 7.5-325 MG tablet    Sig: Take 1 tablet by mouth 3 (three) times daily as needed for severe pain. For Chronic Pain DNF: 01/24/17, 02/21/17    Dispense:  90 tablet    Refill:  0  . HYDROcodone-acetaminophen (NORCO) 7.5-325 MG tablet    Sig: Take 1 tablet by mouth 3 (three) times daily as needed for severe pain. For  Chronic Pain DNF: 01/24/17, 02/21/17    Dispense:  90 tablet    Refill:  0  . gabapentin (NEURONTIN) 300 MG capsule    Sig: TAKE 2 CAPSULES BY MOUTH NIGHTLY/ Efrat Zuidema    Dispense:  60 capsule    Refill:  1  . tiZANidine (ZANAFLEX) 4 MG capsule    Sig: Take 1 capsule (4 mg total) by mouth 2 (two) times daily as needed.    Dispense:  60 capsule     Refill:  2  . orphenadrine (NORFLEX) injection 30 mg  . ketorolac (TORADOL) 30 MG/ML injection 30 mg    Provider-requested follow-up: Return in about 8 weeks (around 03/19/2017) for Medication Management.  Future Appointments  Date Time Provider [place]  03/17/2017 10:45 AM Gillis Santa, MD [name] None    Primary Care Physician: Juline Patch, MD Location: [facility] Note by: Gillis Santa, M.D Date: 01/22/2017; Time: 5:05 PM  Patient Instructions  1. Increase Norco to three times a day as needed for the time being 2. Continue Gabapentin and Tizanidine as prescribed 3. IM Norflex and Toradol today 30 mg both 4. Follow up 8 weeks for MM

## 2017-01-22 NOTE — Patient Instructions (Signed)
1. Increase Norco to three times a day as needed for the time being 2. Continue Gabapentin and Tizanidine as prescribed 3. IM Norflex and Toradol today 30 mg both 4. Follow up 8 weeks for MM

## 2017-01-27 DIAGNOSIS — R809 Proteinuria, unspecified: Secondary | ICD-10-CM | POA: Diagnosis not present

## 2017-01-27 DIAGNOSIS — N183 Chronic kidney disease, stage 3 (moderate): Secondary | ICD-10-CM | POA: Diagnosis not present

## 2017-01-27 DIAGNOSIS — I10 Essential (primary) hypertension: Secondary | ICD-10-CM | POA: Diagnosis not present

## 2017-01-28 ENCOUNTER — Other Ambulatory Visit: Payer: Self-pay | Admitting: Nephrology

## 2017-01-28 ENCOUNTER — Other Ambulatory Visit: Payer: Self-pay | Admitting: Family Medicine

## 2017-01-28 DIAGNOSIS — N183 Chronic kidney disease, stage 3 unspecified: Secondary | ICD-10-CM

## 2017-01-30 ENCOUNTER — Telehealth: Payer: Self-pay

## 2017-01-30 NOTE — Telephone Encounter (Signed)
Patient called and states she is still having pain and would like an MRI on shoulder.

## 2017-02-02 ENCOUNTER — Ambulatory Visit: Payer: Medicare Other

## 2017-02-05 ENCOUNTER — Ambulatory Visit
Admission: RE | Admit: 2017-02-05 | Discharge: 2017-02-05 | Disposition: A | Discharge status: Home / Self Care | Payer: Medicare Other | Source: Ambulatory Visit | Attending: Nephrology | Admitting: Nephrology

## 2017-02-05 DIAGNOSIS — N183 Chronic kidney disease, stage 3 unspecified: Secondary | ICD-10-CM

## 2017-02-05 NOTE — Telephone Encounter (Signed)
Attempted to call patient to inform her.

## 2017-02-18 DIAGNOSIS — D631 Anemia in chronic kidney disease: Secondary | ICD-10-CM | POA: Diagnosis not present

## 2017-02-18 DIAGNOSIS — N2581 Secondary hyperparathyroidism of renal origin: Secondary | ICD-10-CM | POA: Diagnosis not present

## 2017-02-18 DIAGNOSIS — I10 Essential (primary) hypertension: Secondary | ICD-10-CM | POA: Diagnosis not present

## 2017-02-18 DIAGNOSIS — R809 Proteinuria, unspecified: Secondary | ICD-10-CM | POA: Diagnosis not present

## 2017-02-18 DIAGNOSIS — N184 Chronic kidney disease, stage 4 (severe): Secondary | ICD-10-CM | POA: Diagnosis not present

## 2017-02-25 ENCOUNTER — Telehealth: Payer: Self-pay

## 2017-03-05 DIAGNOSIS — M1991 Primary osteoarthritis, unspecified site: Secondary | ICD-10-CM | POA: Diagnosis not present

## 2017-03-05 DIAGNOSIS — R768 Other specified abnormal immunological findings in serum: Secondary | ICD-10-CM | POA: Insufficient documentation

## 2017-03-05 DIAGNOSIS — R682 Dry mouth, unspecified: Secondary | ICD-10-CM | POA: Diagnosis not present

## 2017-03-05 DIAGNOSIS — H04123 Dry eye syndrome of bilateral lacrimal glands: Secondary | ICD-10-CM | POA: Diagnosis not present

## 2017-03-05 DIAGNOSIS — R42 Dizziness and giddiness: Secondary | ICD-10-CM | POA: Diagnosis not present

## 2017-03-05 NOTE — Telephone Encounter (Signed)
error 

## 2017-03-17 ENCOUNTER — Ambulatory Visit
Payer: Medicare Other | Attending: Student in an Organized Health Care Education/Training Program | Admitting: Student in an Organized Health Care Education/Training Program

## 2017-03-17 ENCOUNTER — Other Ambulatory Visit: Payer: Self-pay

## 2017-03-17 ENCOUNTER — Encounter: Payer: Self-pay | Admitting: Student in an Organized Health Care Education/Training Program

## 2017-03-17 VITALS — BP 160/81 | HR 93 | Temp 98.6°F | Resp 26 | Ht 71.0 in | Wt 256.0 lb

## 2017-03-17 DIAGNOSIS — M4698 Unspecified inflammatory spondylopathy, sacral and sacrococcygeal region: Secondary | ICD-10-CM

## 2017-03-17 DIAGNOSIS — Z880 Allergy status to penicillin: Secondary | ICD-10-CM | POA: Insufficient documentation

## 2017-03-17 DIAGNOSIS — M47812 Spondylosis without myelopathy or radiculopathy, cervical region: Secondary | ICD-10-CM | POA: Diagnosis not present

## 2017-03-17 DIAGNOSIS — Z7982 Long term (current) use of aspirin: Secondary | ICD-10-CM | POA: Diagnosis not present

## 2017-03-17 DIAGNOSIS — M47818 Spondylosis without myelopathy or radiculopathy, sacral and sacrococcygeal region: Secondary | ICD-10-CM

## 2017-03-17 DIAGNOSIS — Z87891 Personal history of nicotine dependence: Secondary | ICD-10-CM | POA: Insufficient documentation

## 2017-03-17 DIAGNOSIS — M542 Cervicalgia: Secondary | ICD-10-CM | POA: Diagnosis not present

## 2017-03-17 DIAGNOSIS — Z79899 Other long term (current) drug therapy: Secondary | ICD-10-CM | POA: Diagnosis not present

## 2017-03-17 DIAGNOSIS — M79601 Pain in right arm: Secondary | ICD-10-CM | POA: Diagnosis not present

## 2017-03-17 DIAGNOSIS — N183 Chronic kidney disease, stage 3 (moderate): Secondary | ICD-10-CM | POA: Insufficient documentation

## 2017-03-17 DIAGNOSIS — M25512 Pain in left shoulder: Secondary | ICD-10-CM | POA: Diagnosis not present

## 2017-03-17 DIAGNOSIS — M19111 Post-traumatic osteoarthritis, right shoulder: Secondary | ICD-10-CM | POA: Diagnosis not present

## 2017-03-17 DIAGNOSIS — M25511 Pain in right shoulder: Secondary | ICD-10-CM | POA: Insufficient documentation

## 2017-03-17 DIAGNOSIS — G894 Chronic pain syndrome: Secondary | ICD-10-CM

## 2017-03-17 DIAGNOSIS — S46001S Unspecified injury of muscle(s) and tendon(s) of the rotator cuff of right shoulder, sequela: Secondary | ICD-10-CM | POA: Diagnosis not present

## 2017-03-17 DIAGNOSIS — M4688 Other specified inflammatory spondylopathies, sacral and sacrococcygeal region: Secondary | ICD-10-CM | POA: Diagnosis not present

## 2017-03-17 DIAGNOSIS — M19011 Primary osteoarthritis, right shoulder: Secondary | ICD-10-CM | POA: Diagnosis not present

## 2017-03-17 DIAGNOSIS — M545 Low back pain: Secondary | ICD-10-CM | POA: Insufficient documentation

## 2017-03-17 DIAGNOSIS — Z9889 Other specified postprocedural states: Secondary | ICD-10-CM | POA: Diagnosis not present

## 2017-03-17 MED ORDER — HYDROCODONE-ACETAMINOPHEN 7.5-325 MG PO TABS: 1.0000 | ORAL_TABLET | Freq: Three times a day (TID) | ORAL | 0 refills | Status: DC | PRN

## 2017-03-17 NOTE — Patient Instructions (Signed)
GENERAL RISKS AND COMPLICATIONS  What are the risk, side effects and possible complications? Generally speaking, most procedures are safe.  However, with any procedure there are risks, side effects, and the possibility of complications.  The risks and complications are dependent upon the sites that are lesioned, or the type of nerve block to be performed.  The closer the procedure is to the spine, the more serious the risks are.  Great care is taken when placing the radio frequency needles, block needles or lesioning probes, but sometimes complications can occur. 1. Infection: Any time there is an injection through the skin, there is a risk of infection.  This is why sterile conditions are used for these blocks.  There are four possible types of infection. 1. Localized skin infection. 2. Central Nervous System Infection-This can be in the form of Meningitis, which can be deadly. 3. Epidural Infections-This can be in the form of an epidural abscess, which can cause pressure inside of the spine, causing compression of the spinal cord with subsequent paralysis. This would require an emergency surgery to decompress, and there are no guarantees that the patient would recover from the paralysis. 4. Discitis-This is an infection of the intervertebral discs.  It occurs in about 1% of discography procedures.  It is difficult to treat and it may lead to surgery.        2. Pain: the needles have to go through skin and soft tissues, will cause soreness.       3. Damage to internal structures:  The nerves to be lesioned may be near blood vessels or    other nerves which can be potentially damaged.       4. Bleeding: Bleeding is more common if the patient is taking blood thinners such as  aspirin, Coumadin, Ticiid, Plavix, etc., or if he/she have some genetic predisposition  such as hemophilia. Bleeding into the spinal canal can cause compression of the spinal  cord with subsequent paralysis.  This would require an  emergency surgery to  decompress and there are no guarantees that the patient would recover from the  paralysis.       5. Pneumothorax:  Puncturing of a lung is a possibility, every time a needle is introduced in  the area of the chest or upper back.  Pneumothorax refers to free air around the  collapsed lung(s), inside of the thoracic cavity (chest cavity).  Another two possible  complications related to a similar event would include: Hemothorax and Chylothorax.   These are variations of the Pneumothorax, where instead of air around the collapsed  lung(s), you may have blood or chyle, respectively.       6. Spinal headaches: They may occur with any procedures in the area of the spine.       7. Persistent CSF (Cerebro-Spinal Fluid) leakage: This is a rare problem, but may occur  with prolonged intrathecal or epidural catheters either due to the formation of a fistulous  track or a dural tear.       8. Nerve damage: By working so close to the spinal cord, there is always a possibility of  nerve damage, which could be as serious as a permanent spinal cord injury with  paralysis.       9. Death:  Although rare, severe deadly allergic reactions known as "Anaphylactic  reaction" can occur to any of the medications used.      10. Worsening of the symptoms:  We can always make thing worse.    What are the chances of something like this happening? Chances of any of this occuring are extremely low.  By statistics, you have more of a chance of getting killed in a motor vehicle accident: while driving to the hospital than any of the above occurring .  Nevertheless, you should be aware that they are possibilities.  In general, it is similar to taking a shower.  Everybody knows that you can slip, hit your head and get killed.  Does that mean that you should not shower again?  Nevertheless always keep in mind that statistics do not mean anything if you happen to be on the wrong side of them.  Even if a procedure has a 1  (one) in a 1,000,000 (million) chance of going wrong, it you happen to be that one..Also, keep in mind that by statistics, you have more of a chance of having something go wrong when taking medications.  Who should not have this procedure? If you are on a blood thinning medication (e.g. Coumadin, Plavix, see list of "Blood Thinners"), or if you have an active infection going on, you should not have the procedure.  If you are taking any blood thinners, please inform your physician.  How should I prepare for this procedure?  Do not eat or drink anything at least six hours prior to the procedure.  Bring a driver with you .  It cannot be a taxi.  Come accompanied by an adult that can drive you back, and that is strong enough to help you if your legs get weak or numb from the local anesthetic.  Take all of your medicines the morning of the procedure with just enough water to swallow them.  If you have diabetes, make sure that you are scheduled to have your procedure done first thing in the morning, whenever possible.  If you have diabetes, take only half of your insulin dose and notify our nurse that you have done so as soon as you arrive at the clinic.  If you are diabetic, but only take blood sugar pills (oral hypoglycemic), then do not take them on the morning of your procedure.  You may take them after you have had the procedure.  Do not take aspirin or any aspirin-containing medications, at least eleven (11) days prior to the procedure.  They may prolong bleeding.  Wear loose fitting clothing that may be easy to take off and that you would not mind if it got stained with Betadine or blood.  Do not wear any jewelry or perfume  Remove any nail coloring.  It will interfere with some of our monitoring equipment.  NOTE: Remember that this is not meant to be interpreted as a complete list of all possible complications.  Unforeseen problems may occur.  BLOOD THINNERS The following drugs  contain aspirin or other products, which can cause increased bleeding during surgery and should not be taken for 2 weeks prior to and 1 week after surgery.  If you should need take something for relief of minor pain, you may take acetaminophen which is found in Tylenol,m Datril, Anacin-3 and Panadol. It is not blood thinner. The products listed below are.  Do not take any of the products listed below in addition to any listed on your instruction sheet.  A.P.C or A.P.C with Codeine Codeine Phosphate Capsules #3 Ibuprofen Ridaura  ABC compound Congesprin Imuran rimadil  Advil Cope Indocin Robaxisal  Alka-Seltzer Effervescent Pain Reliever and Antacid Coricidin or Coricidin-D  Indomethacin Rufen    Alka-Seltzer plus Cold Medicine Cosprin Ketoprofen S-A-C Tablets  Anacin Analgesic Tablets or Capsules Coumadin Korlgesic Salflex  Anacin Extra Strength Analgesic tablets or capsules CP-2 Tablets Lanoril Salicylate  Anaprox Cuprimine Capsules Levenox Salocol  Anexsia-D Dalteparin Magan Salsalate  Anodynos Darvon compound Magnesium Salicylate Sine-off  Ansaid Dasin Capsules Magsal Sodium Salicylate  Anturane Depen Capsules Marnal Soma  APF Arthritis pain formula Dewitt's Pills Measurin Stanback  Argesic Dia-Gesic Meclofenamic Sulfinpyrazone  Arthritis Bayer Timed Release Aspirin Diclofenac Meclomen Sulindac  Arthritis pain formula Anacin Dicumarol Medipren Supac  Analgesic (Safety coated) Arthralgen Diffunasal Mefanamic Suprofen  Arthritis Strength Bufferin Dihydrocodeine Mepro Compound Suprol  Arthropan liquid Dopirydamole Methcarbomol with Aspirin Synalgos  ASA tablets/Enseals Disalcid Micrainin Tagament  Ascriptin Doan's Midol Talwin  Ascriptin A/D Dolene Mobidin Tanderil  Ascriptin Extra Strength Dolobid Moblgesic Ticlid  Ascriptin with Codeine Doloprin or Doloprin with Codeine Momentum Tolectin  Asperbuf Duoprin Mono-gesic Trendar  Aspergum Duradyne Motrin or Motrin IB Triminicin  Aspirin  plain, buffered or enteric coated Durasal Myochrisine Trigesic  Aspirin Suppositories Easprin Nalfon Trillsate  Aspirin with Codeine Ecotrin Regular or Extra Strength Naprosyn Uracel  Atromid-S Efficin Naproxen Ursinus  Auranofin Capsules Elmiron Neocylate Vanquish  Axotal Emagrin Norgesic Verin  Azathioprine Empirin or Empirin with Codeine Normiflo Vitamin E  Azolid Emprazil Nuprin Voltaren  Bayer Aspirin plain, buffered or children's or timed BC Tablets or powders Encaprin Orgaran Warfarin Sodium  Buff-a-Comp Enoxaparin Orudis Zorpin  Buff-a-Comp with Codeine Equegesic Os-Cal-Gesic   Buffaprin Excedrin plain, buffered or Extra Strength Oxalid   Bufferin Arthritis Strength Feldene Oxphenbutazone   Bufferin plain or Extra Strength Feldene Capsules Oxycodone with Aspirin   Bufferin with Codeine Fenoprofen Fenoprofen Pabalate or Pabalate-SF   Buffets II Flogesic Panagesic   Buffinol plain or Extra Strength Florinal or Florinal with Codeine Panwarfarin   Buf-Tabs Flurbiprofen Penicillamine   Butalbital Compound Four-way cold tablets Penicillin   Butazolidin Fragmin Pepto-Bismol   Carbenicillin Geminisyn Percodan   Carna Arthritis Reliever Geopen Persantine   Carprofen Gold's salt Persistin   Chloramphenicol Goody's Phenylbutazone   Chloromycetin Haltrain Piroxlcam   Clmetidine heparin Plaquenil   Cllnoril Hyco-pap Ponstel   Clofibrate Hydroxy chloroquine Propoxyphen         Before stopping any of these medications, be sure to consult the physician who ordered them.  Some, such as Coumadin (Warfarin) are ordered to prevent or treat serious conditions such as "deep thrombosis", "pumonary embolisms", and other heart problems.  The amount of time that you may need off of the medication may also vary with the medication and the reason for which you were taking it.  If you are taking any of these medications, please make sure you notify your pain physician before you undergo any  procedures.         Sacroiliac (SI) Joint Injection Patient Information  Description: The sacroiliac joint connects the scrum (very low back and tailbone) to the ilium (a pelvic bone which also forms half of the hip joint).  Normally this joint experiences very little motion.  When this joint becomes inflamed or unstable low back and or hip and pelvis pain may result.  Injection of this joint with local anesthetics (numbing medicines) and steroids can provide diagnostic information and reduce pain.  This injection is performed with the aid of x-ray guidance into the tailbone area while you are lying on your stomach.   You may experience an electrical sensation down the leg while this is being done.  You may also experience numbness.  We   also may ask if we are reproducing your normal pain during the injection.  Conditions which may be treated SI injection:   Low back, buttock, hip or leg pain  Preparation for the Injection:  1. Do not eat any solid food or dairy products within 8 hours of your appointment.  2. You may drink clear liquids up to 3 hours before appointment.  Clear liquids include water, black coffee, juice or soda.  No milk or cream please. 3. You may take your regular medications, including pain medications with a sip of water before your appointment.  Diabetics should hold regular insulin (if take separately) and take 1/2 normal NPH dose the morning of the procedure.  Carry some sugar containing items with you to your appointment. 4. A driver must accompany you and be prepared to drive you home after your procedure. 5. Bring all of your current medications with you. 6. An IV may be inserted and sedation may be given at the discretion of the physician. 7. A blood pressure cuff, EKG and other monitors will often be applied during the procedure.  Some patients may need to have extra oxygen administered for a short period.  8. You will be asked to provide medical information,  including your allergies, prior to the procedure.  We must know immediately if you are taking blood thinners (like Coumadin/Warfarin) or if you are allergic to IV iodine contrast (dye).  We must know if you could possible be pregnant.  Possible side effects:   Bleeding from needle site  Infection (rare, may require surgery)  Nerve injury (rare)  Numbness & tingling (temporary)  A brief convulsion or seizure  Light-headedness (temporary)  Pain at injection site (several days)  Decreased blood pressure (temporary)  Weakness in the leg (temporary)   Call if you experience:   New onset weakness or numbness of an extremity below the injection site that last more than 8 hours.  Hives or difficulty breathing ( go to the emergency room)  Inflammation or drainage at the injection site  Any new symptoms which are concerning to you  Please note:  Although the local anesthetic injected can often make your back/ hip/ buttock/ leg feel good for several hours after the injections, the pain will likely return.  It takes 3-7 days for steroids to work in the sacroiliac area.  You may not notice any pain relief for at least that one week.  If effective, we will often do a series of three injections spaced 3-6 weeks apart to maximally decrease your pain.  After the initial series, we generally will wait some months before a repeat injection of the same type.  If you have any questions, please call [phone] [facility]   

## 2017-03-17 NOTE — Progress Notes (Signed)
Nursing Pain Medication Assessment:  Safety precautions to be maintained throughout the outpatient stay will include: orient to surroundings, keep bed in low position, maintain call bell within reach at all times, provide assistance with transfer out of bed and ambulation.  Medication Inspection Compliance: Pill count conducted under aseptic conditions, in front of the patient. Neither the pills nor the bottle was removed from the patient's sight at any time. Once count was completed pills were immediately returned to the patient in their original bottle.  Medication: See above Pill/Patch Count: 22 of 90 pills remain Pill/Patch Appearance: Markings consistent with prescribed medication Bottle Appearance: Standard pharmacy container. Clearly labeled. Filled Date: 01 / 02 / 2019 Last Medication intake:  Today

## 2017-03-17 NOTE — Progress Notes (Signed)
Patient's Name: Michele Moore  MRN: [id number]  Referring Provider: Juline Patch, Moore  DOB: 1951/01/31  PCP: Michele Patch, Moore  DOS: 03/17/2017  Note by: Michele Moore  Service setting: Ambulatory outpatient  Specialty: Interventional Pain Management  Location: [facility] (AMB) Pain Management Facility    Patient type: Established   Primary Reason(s) for Visit: Encounter for prescription drug management. (Level of risk: moderate)  CC: Shoulder Pain (bilateral) and Back Pain (lower)  HPI  Michele Moore is a 67 y.o. year old, female patient, who comes today for a medication management evaluation. She has Pain, lower extremity on their problem list. Her primarily concern today is the Shoulder Pain (bilateral) and Back Pain (lower)  Pain Assessment: Location: Lower Shoulder Radiating: right shoulder down to upper arm, back radiates to left hip dow side of leg to knee Onset: More than a month ago Duration: Chronic pain Quality: Aching, Tender, Sharp, Shooting Severity: 10-Worst pain ever/10 (self-reported pain score)  Note: Reported level is inconsistent with clinical observations. Clinically the patient looks like a 4/10             When using our objective Pain Scale, levels between 6 and 10/10 are said to belong in an emergency room, as it progressively worsens from a 6/10, described as severely limiting, requiring emergency care not usually available at an outpatient pain management facility. At a 6/10 level, communication becomes difficult and requires great effort. Assistance to reach the emergency department may be required. Facial flushing and profuse sweating along with potentially dangerous increases in heart rate and blood pressure will be evident. Effect on ADL: lifting arm, bending, twisting, prolonged walking, taking bath , anything lifting arm Timing: Constant Modifying factors: medication  Michele Moore was last scheduled for an appointment on 01/22/2017 for medication management. During  today's appointment we reviewed Michele Moore's chronic pain status, as well as her outpatient medication regimen.  Follows up today, still continuing to endorse severe right arm pain that started after pneumoccal vaccine in early November. Endorses RUE swelling and redness. Has significant difficulty with ROM.  The patient  reports that she does not use drugs. Her body mass index is 35.7 kg/m.  Further details on both, my assessment(s), as well as the proposed treatment plan, please see below.  Controlled Substance Pharmacotherapy Assessment REMS (Risk Evaluation and Mitigation Strategy)  Analgesic: Hydrocodone 7.5 mg TID prn, #90 MME/day: 22.5 mg/day.  Michele Moore  03/17/2017 11:42 AM  Sign at close encounter Nursing Pain Medication Assessment:  Safety precautions to be maintained throughout the outpatient stay will include: orient to surroundings, keep bed in low position, maintain call bell within reach at all times, provide assistance with transfer out of bed and ambulation.  Medication Inspection Compliance: Pill count conducted under aseptic conditions, in front of the patient. Neither the pills nor the bottle was removed from the patient's sight at any time. Once count was completed pills were immediately returned to the patient in their original bottle.  Medication: See above Pill/Moore Count: 22 of 90 pills remain Pill/Moore Appearance: Markings consistent with prescribed medication Bottle Appearance: Standard pharmacy container. Clearly labeled. Filled Date: 01 / 02 / 2019 Last Medication intake:  Today   Pharmacokinetics: Liberation and absorption (onset of action): WNL Distribution (time to peak effect): WNL Metabolism and excretion (duration of action): WNL         Pharmacodynamics: Desired effects: Analgesia: Michele Moore reports >50% benefit. Functional ability: Patient reports that medication allows  her to accomplish basic ADLs Clinically meaningful improvement in  function (CMIF): Sustained CMIF goals met Perceived effectiveness: Described as relatively effective, allowing for increase in activities of daily living (ADL) Undesirable effects: Side-effects or Adverse reactions: None reported Monitoring: [place] PMP: Online review of the past 80-monthperiod conducted. Compliant with practice rules and regulations Last UDS on record: Summary  Date Value Ref Range Status  10/14/2016 FINAL  Final    Comment:    ==================================================================== [facility]RE COMP DRUG ANALYSIS,UR ==================================================================== Test                             Result       Flag       Units Drug Present and Declared for Prescription Verification   Gabapentin                     PRESENT      EXPECTED   Acetaminophen                  PRESENT      EXPECTED Drug Present not Declared for Prescription Verification   Dextrorphan/Levorphanol        PRESENT      UNEXPECTED    Dextrorphan is an expected metabolite of dextromethorphan, an    over-the-counter or prescription cough suppressant. Levorphanol    is a scheduled prescription medication. Dextrorphan cannot be    distinguished from levorphanol by the method used for analysis. Drug Absent but Declared for Prescription Verification   Hydrocodone                    Not Detected UNEXPECTED ng/mg creat   Oxycodone                      Not Detected UNEXPECTED ng/mg creat   Ephedrine/Pseudoephedrine      Not Detected UNEXPECTED   Tramadol                       Not Detected UNEXPECTED   Pregabalin                     Not Detected UNEXPECTED   Tizanidine                     Not Detected UNEXPECTED    Tizanidine, as indicated in the declared medication list, is not    always detected even when used as directed.   Duloxetine                     Not Detected UNEXPECTED   Salicylate                     Not Detected UNEXPECTED    Aspirin, as indicated in the declared  medication list, is not    always detected even when used as directed.   Chlorpheniramine               Not Detected UNEXPECTED   Hydroxyzine                    Not Detected UNEXPECTED   Lidocaine                      Not Detected UNEXPECTED    Lidocaine, as indicated in the declared medication list, is not  always detected even when used as directed. ==================================================================== Test                      Result    Flag   Units      Ref Range   Creatinine              101              mg/dL      >=20 ==================================================================== Declared Medications:  The flagging and interpretation on this report are based on the  following declared medications.  Unexpected results may arise from  inaccuracies in the declared medications.  **Note: The testing scope of this panel includes these medications:  Chlorpheniramine (Tussionex)  Duloxetine  Gabapentin  Hydrocodone (Hydrocodone-Acetaminophen)  Hydrocodone (Tussionex)  Hydroxyzine  Oxycodone (Oxycodone Acetaminophen)  Pregabalin  Pseudoephedrine (Fexofenadine Pseudoephedrine)  Tramadol  **Note: The testing scope of this panel does not include small to  moderate amounts of these reported medications:  Acetaminophen  Acetaminophen (Hydrocodone-Acetaminophen)  Acetaminophen (Oxycodone Acetaminophen)  Aspirin  Lidocaine  Tizanidine  **Note: The testing scope of this panel does not include following  reported medications:  Cefuroxime axetil  Fexofenadine (Fexofenadine Pseudoephedrine)  Hydralazine  Hydrochlorothiazide (Triamterine-Hydrochlorthzide)  Ketoconazole  Levothyroxine  Meloxicam  Mupirocin  Nystatin  Omeprazole  Polyethylene Glycol  Polymyxin  Potassium  Prednisone  Ranitidine  Supplement (Omega-3)  Topical  Triamcinolone acetonide  Triamterene (Triamterine-Hydrochlorthzide)   Trimethoprim ==================================================================== For clinical consultation, please call [phone]. ====================================================================    UDS interpretation: Compliant          Medication Assessment Form: Reviewed. Patient indicates being compliant with therapy Treatment compliance: Compliant Risk Assessment Profile: Aberrant behavior: See prior evaluations. None observed or detected today Comorbid factors increasing risk of overdose: See prior notes. No additional risks detected today Risk of substance use disorder (SUD): Low Opioid Risk Tool - 01/22/17 1046      Family History of Substance Abuse   Alcohol  Negative    Illegal Drugs  Negative    Rx Drugs  Negative      Personal History of Substance Abuse   Alcohol  Negative    Illegal Drugs  Negative    Rx Drugs  Negative      Age   Age between 7-45 years   No      History of Preadolescent Sexual Abuse   History of Preadolescent Sexual Abuse  Negative or Female      Psychological Disease   Psychological Disease  Negative    Depression  Negative      Total Score   Opioid Risk Tool Scoring  0    Opioid Risk Interpretation  Low Risk      ORT Scoring interpretation table:  Score <3 = Low Risk for SUD  Score between 4-7 = Moderate Risk for SUD  Score >8 = High Risk for Opioid Abuse   Risk Mitigation Strategies:  Patient Counseling: Covered Patient-Prescriber Agreement (PPA): Present and active  Notification to other healthcare providers: Done  Pharmacologic Plan: No change in therapy, at this time.             Laboratory Chemistry  Inflammation Markers (CRP: Acute Phase) (ESR: Chronic Phase) No results found for: CRP, ESRSEDRATE, LATICACIDVEN               Rheumatology Markers No results found for: RF, ANA, LABURIC, URICUR, LYMEIGGIGMAB, LYMEABIGMQN  Renal Function Markers Lab Results  Component Value Date   BUN 30 (H)  12/23/2016   CREATININE 1.96 (H) 12/23/2016   GFRAA 30 (L) 12/23/2016   GFRNONAA 26 (L) 12/23/2016                 Hepatic Function Markers Lab Results  Component Value Date   AST 21 12/27/2013   ALT 19 12/27/2013   ALBUMIN 4.5 12/23/2016   ALKPHOS 91 12/27/2013                 Electrolytes Lab Results  Component Value Date   NA 141 12/23/2016   K 4.5 12/23/2016   CL 103 12/23/2016   CALCIUM 9.7 12/23/2016   MG 2.2 12/27/2013   PHOS 3.0 12/23/2016                 Neuropathy Markers No results found for: VITAMINB12, FOLATE, HGBA1C, HIV               Bone Pathology Markers No results found for: VD25OH, VD125OH2TOT, CH8850YD7, AJ2878MV6, 25OHVITD1, 25OHVITD2, 25OHVITD3, TESTOFREE, TESTOSTERONE               Coagulation Parameters Lab Results  Component Value Date   PLT 150 08/03/2015                 Cardiovascular Markers Lab Results  Component Value Date   CKTOTAL 154 12/27/2013   CKMB 0.8 12/27/2013   TROPONINI < 0.02 12/27/2013   HGB 10.6 (L) 08/03/2015   HCT 32.4 (L) 08/03/2015                 CA Markers No results found for: CEA, CA125, LABCA2               Note: Lab results reviewed.  Recent Diagnostic Imaging Results  US RENAL CLINICAL DATA:  Chronic renal insufficiency stage III  EXAM: RENAL / URINARY TRACT ULTRASOUND COMPLETE  COMPARISON:  None in PACs  FINDINGS: Right Kidney:  Length: An 11.0 cm. The renal cortical echotexture remains lower than that of the liver. There is no mass or hydronephrosis.  Left Kidney:  Length: 11.0 cm. The renal cortical echotexture is similar to that on the right. There is no mass nor hydronephrosis.  Bladder:  Appears normal for degree of bladder distention.  IMPRESSION: Normal urinary tract ultrasound examination.  Electronically Signed   By: David  Martinique M.D.   On: 02/05/2017 16:33  Complexity Note: Imaging results reviewed. Results shared with Ms. Popiel, using Layman's terms.                          Meds   Current Outpatient Medications:  .  aspirin 81 MG tablet, Take 81 mg by mouth daily., Disp: , Rfl:  .  clindamycin (CLEOCIN) 300 MG capsule, Take 1 capsule 4 (four) times daily by mouth., Disp: , Rfl: 0 .  ferrous sulfate 325 (65 FE) MG tablet, Take 325 mg by mouth daily with breakfast. otc, Disp: , Rfl:  .  fexofenadine-pseudoephedrine (ALLEGRA-D 24) 180-240 MG 24 hr tablet, Take by mouth. otc, Disp: , Rfl:  .  gabapentin (NEURONTIN) 300 MG capsule, TAKE 2 CAPSULES BY MOUTH NIGHTLY/ Bluford Sedler, Disp: 60 capsule, Rfl: 1 .  hydrALAZINE (APRESOLINE) 50 MG tablet, Take 1 tablet (50 mg total) by mouth 2 (two) times daily., Disp: 60 tablet, Rfl: 5 .  HYDROcodone-acetaminophen (NORCO) 7.5-325 MG tablet, Take 1 tablet by mouth 3 (  three) times daily as needed for severe pain. For Chronic Pain DNF: 03/27/17, Disp: 90 tablet, Rfl: 0 .  ibuprofen (ADVIL,MOTRIN) 200 MG tablet, Take 400 mg by mouth as needed. otc, Disp: , Rfl:  .  ketoconazole (NIZORAL) 2 % cream, U UTD QHS, Disp: , Rfl:  .  levothyroxine (SYNTHROID) 200 MCG tablet, Take 1 tablet (200 mcg total) by mouth daily before breakfast., Disp: 30 tablet, Rfl: 5 .  lidocaine (XYLOCAINE) 5 % ointment, Yancy Knoble, Disp: , Rfl:  .  meloxicam (MOBIC) 15 MG tablet, Take 1 tablet (15 mg total) by mouth daily., Disp: 30 tablet, Rfl: 0 .  methylPREDNISolone (MEDROL DOSEPAK) 4 MG TBPK tablet, TK PO UTD, Disp: , Rfl: 0 .  Multiple Vitamins-Calcium (ONE-A-DAY WOMENS FORMULA PO), Take 1 tablet by mouth daily., Disp: , Rfl:  .  mupirocin cream (BACTROBAN) 2 %, Apply 1 application topically 2 (two) times daily., Disp: 15 g, Rfl: 0 .  nystatin cream (MYCOSTATIN), Apply topically., Disp: , Rfl:  .  omega-3 acid ethyl esters (LOVAZA) 1 g capsule, Take 2 capsules (2 g total) by mouth 2 (two) times daily., Disp: 120 capsule, Rfl: 5 .  omeprazole (PRILOSEC) 40 MG capsule, Take 1 capsule (40 mg total) by mouth daily., Disp: 30 capsule, Rfl: 5 .  Polyethyl  Glycol-Propyl Glycol (SYSTANE OP), Apply to eye., Disp: , Rfl:  .  polyethylene glycol (MIRALAX / GLYCOLAX) packet, Take 17 g by mouth daily., Disp: 14 each, Rfl: 0 .  potassium chloride SA (K-DUR,KLOR-CON) 20 MEQ tablet, Take 1 tablet (20 mEq total) by mouth daily., Disp: 30 tablet, Rfl: 5 .  predniSONE (DELTASONE) 10 MG tablet, Take 6 tablets on day 1, take 5 tablets on day 2, take 4 tablets on day 3, take 3 tablets on day 4, take 2 tablets on day 5, take 1 tablet on day 6, Disp: 21 tablet, Rfl: 0 .  ranitidine (ZANTAC) 150 MG tablet, Take 1 tablet (150 mg total) by mouth 2 (two) times daily., Disp: 60 tablet, Rfl: 5 .  tiZANidine (ZANAFLEX) 4 MG capsule, Take 1 capsule (4 mg total) by mouth 2 (two) times daily as needed., Disp: 60 capsule, Rfl: 2 .  triamcinolone cream (KENALOG) 0.5 %, Apply topically., Disp: , Rfl:  .  triamterene-hydrochlorothiazide (MAXZIDE) 75-50 MG tablet, Take 1 tablet by mouth daily., Disp: 30 tablet, Rfl: 5 .  trimethoprim-polymyxin b (POLYTRIM) ophthalmic solution, , Disp: , Rfl:  .  enoxaparin (LOVENOX) 40 MG/0.4ML injection, Inject 0.4 mLs (40 mg total) into the skin daily. (Patient not taking: Reported on 12/23/2016), Disp: 9 Syringe, Rfl: 0  ROS  Constitutional: Denies any fever or chills Gastrointestinal: No reported hemesis, hematochezia, vomiting, or acute GI distress Musculoskeletal: Denies any acute onset joint swelling, redness, loss of ROM, or weakness Neurological: No reported episodes of acute onset apraxia, aphasia, dysarthria, agnosia, amnesia, paralysis, loss of coordination, or loss of consciousness  Allergies  Ms. Klecka is allergic to ampicillin; penicillins; and vibramycin [doxycycline calcium].  PFSH  Drug: Ms. Colville  reports that she does not use drugs. Alcohol:  reports that she does not drink alcohol. Tobacco:  reports that she quit smoking about 24 years ago. she has never used smokeless tobacco. Medical:  has a past medical history of  Anemia, Arthritis, Chronic kidney disease, Family history of adverse reaction to anesthesia, GERD (gastroesophageal reflux disease), H/O tooth extraction, Hemorrhoid, History of hiatal hernia, Hypertension, Migraines, Mixed hyperlipidemia, Multiple gastric ulcers, and Thyroid disease. Surgical: Ms. Rozar  has  a past surgical history that includes Carpal tunnel release; Rotator cuff repair; Ankle surgery; Tubal ligation; Colonoscopy (2000?); Upper gi endoscopy (2000?); Joint replacement; Tonsillectomy; and Fusion of talonavicular joint (Right, 08/03/2015). Family: family history includes COPD in her sister; Cancer in her maternal aunt and maternal uncle.  Constitutional Exam  General appearance: Well nourished, well developed, and well hydrated. In no apparent acute distress Vitals:   03/17/17 1127  BP: (!) 160/81  Pulse: 93  Resp: (!) 26  Temp: 98.6 F (37 C)  SpO2: 98%  Weight: 256 lb (116.1 kg)  Height: '5\' 11"'  (1.803 m)   BMI Assessment: Estimated body mass index is 35.7 kg/m as calculated from the following:   Height as of this encounter: '5\' 11"'  (1.803 m).   Weight as of this encounter: 256 lb (116.1 kg).  BMI interpretation table: BMI level Category Range association with higher incidence of chronic pain  <18 kg/m2 Underweight   18.5-24.9 kg/m2 Ideal body weight   25-29.9 kg/m2 Overweight Increased incidence by 20%  30-34.9 kg/m2 Obese (Class I) Increased incidence by 68%  35-39.9 kg/m2 Severe obesity (Class II) Increased incidence by 136%  >40 kg/m2 Extreme obesity (Class III) Increased incidence by 254%   BMI Readings from Last 4 Encounters:  03/17/17 35.70 kg/m  01/22/17 35.57 kg/m  01/02/17 35.57 kg/m  01/02/17 35.57 kg/m   Wt Readings from Last 4 Encounters:  03/17/17 256 lb (116.1 kg)  01/22/17 255 lb (115.7 kg)  01/02/17 255 lb (115.7 kg)  01/02/17 255 lb (115.7 kg)  Psych/Mental status: Alert, oriented x 3 (person, place, & time)       Eyes: PERLA Respiratory:  No evidence of acute respiratory distress  Cervical Spine Area Exam  Skin & Axial Inspection: No masses, redness, edema, swelling, or associated skin lesions Alignment: Symmetrical Functional ROM: Unrestricted ROM      Stability: No instability detected Muscle Tone/Strength: Functionally intact. No obvious neuro-muscular anomalies detected. Sensory (Neurological): Unimpaired Palpation: No palpable anomalies              Upper Extremity (UE) Exam     Side: Right upper extremity  Side: Left upper extremity  Skin & Extremity Inspection: Edema, slight erythema  Skin & Extremity Inspection: Skin color, temperature, and hair growth are WNL. No peripheral edema or cyanosis. No masses, redness, swelling, asymmetry, or associated skin lesions. No contractures.  Functional ROM: Decreased ROM for shoulder and elbow shoulder greater than elbow  Functional ROM: Unrestricted ROM          Muscle Tone/Strength: Movement possible against some resistance (4/5) limited by pain  Muscle Tone/Strength: Functionally intact. No obvious neuro-muscular anomalies detected.  Sensory (Neurological): Myotome pain pattern          Sensory (Neurological): Unimpaired          Palpation: Complains of area being tender to palpation             Overlying the right deltoid and right bicep  Palpation: No palpable anomalies              Specialized Test(s): Deferred         Specialized Test(s): Deferred            Thoracic Spine Area Exam  Skin & Axial Inspection: No masses, redness, or swelling Alignment: Symmetrical Functional ROM: Unrestricted ROM Stability: No instability detected Muscle Tone/Strength: Functionally intact. No obvious neuro-muscular anomalies detected. Sensory (Neurological): Unimpaired Muscle strength & Tone: No palpable anomalies  Lumbar Spine Area  Exam  Skin & Axial Inspection: No masses, redness, or swelling Alignment: Symmetrical Functional ROM: Unrestricted ROM      Stability: No  instability detected Muscle Tone/Strength: Functionally intact. No obvious neuro-muscular anomalies detected. Sensory (Neurological): Unimpaired Palpation: No palpable anomalies       Provocative Tests: Lumbar Hyperextension and rotation test: evaluation deferred today       Lumbar Lateral bending test: evaluation deferred today       Patrick's Maneuver: Positive for bilateral S-I arthralgia              Gait & Posture Assessment  Ambulation: Unassisted Gait: Antalgic Posture: WNL   Lower Extremity Exam    Side: Right lower extremity  Side: Left lower extremity  Skin & Extremity Inspection: Skin color, temperature, and hair growth are WNL. No peripheral edema or cyanosis. No masses, redness, swelling, asymmetry, or associated skin lesions. No contractures.  Skin & Extremity Inspection: Skin color, temperature, and hair growth are WNL. No peripheral edema or cyanosis. No masses, redness, swelling, asymmetry, or associated skin lesions. No contractures.  Functional ROM: Unrestricted ROM          Functional ROM: Unrestricted ROM          Muscle Tone/Strength: Functionally intact. No obvious neuro-muscular anomalies detected.  Muscle Tone/Strength: Functionally intact. No obvious neuro-muscular anomalies detected.  Sensory (Neurological): Unimpaired  Sensory (Neurological): Unimpaired  Palpation: No palpable anomalies  Palpation: No palpable anomalies   Assessment  Primary Diagnosis & Pertinent Problem List: The primary encounter diagnosis was SI joint arthritis ([place]). Diagnoses of Cervical facet syndrome, Chronic pain syndrome, and Osteoarthritis of right shoulder due to rotator cuff injury were also pertinent to this visit.  Status Diagnosis  Having a Flare-up Improved Stable 1. SI joint arthritis (HCC)   2. Cervical facet syndrome   3. Chronic pain syndrome   4. Osteoarthritis of right shoulder due to rotator cuff injury      67 year old female with a history of chronic pain  syndrome secondary to SI joint arthritis and cervical spondylosis most pronounced on the left status post left C4-C7 medial branch nerve block with significant improvement in her left neck and left shoulder pain symptoms.  Note patient received a pneumococcal vaccine in her right shoulder and has experienced significant pain and disability since the injection to the point that she has had an emergency department visit along with imaging of her right shoulder which did not show any acute intraosseous process.  She is completed a course of antibiotics for presumed cellulitis along with a steroid taper for her right shoulder pain that is significant.  She also has limited right shoulder range of motion.  It is unclear why the patient is having such significant pain and limited range of motion as a result of the pneumococcal vaccine. This has been going on since November and very odd that it has not improved. Discussed right shoulder MRI and patient would like to proceed to evaluate soft tissue/ligamentous injury since patient has hx of right rotator cuff surgery but shoulder pain was acute onset after pneumococcal vaccine.  She is also endorsing hip, buttock and groin pain secondary to SI joint arthritis. Previous SI joint injection was 8/27, will repeat.  Plan: -right shoulder MRI (hx of right rotator cuff surgery, acute right shoulder/arm pain started after pneumococcal vaccine in 12/2016 that has persistently gotten worse (associated arm edema and redness) -Refill Hydrocodone as below -Schedule for bilateral SI joint injection  Plan of Care  Pharmacotherapy (  Medications Ordered): Meds ordered this encounter  Medications  . HYDROcodone-acetaminophen (NORCO) 7.5-325 MG tablet    Sig: Take 1 tablet by mouth 3 (three) times daily as needed for severe pain. For Chronic Pain DNF: 03/27/17    Dispense:  90 tablet    Refill:  0   Lab-work, procedure(s), and/or referral(s): Orders Placed This Encounter   Procedures  . SACROILIAC JOINT INJECTION  . MR SHOULDER RIGHT WO CONTRAST   Provider-requested follow-up: Return in about 2 weeks (around 03/30/2017) for Procedure. Time Note: Greater than 50% of the 25 minute(s) of face-to-face time spent with Ms. Bain, was spent in counseling/coordination of care regarding: Ms. Jacquot's primary cause of pain, the treatment plan, treatment alternatives, the appropriate use of her medications and realistic expectations. Future Appointments  Date Time Provider [place]  03/20/2017 10:15 AM [facility]  03/30/2017 10:15 AM Michele Moore [facility] None    Primary Care Physician: Michele Patch, Moore Location: [facility] Note by: Michele Moore, M.D Date: 03/17/2017; Time: 1:02 PM  Patient Instructions   GENERAL RISKS AND COMPLICATIONS  What are the risk, side effects and possible complications? Generally speaking, most procedures are safe.  However, with any procedure there are risks, side effects, and the possibility of complications.  The risks and complications are dependent upon the sites that are lesioned, or the type of nerve block to be performed.  The closer the procedure is to the spine, the more serious the risks are.  Great care is taken when placing the radio frequency needles, block needles or lesioning probes, but sometimes complications can occur. 1. Infection: Any time there is an injection through the skin, there is a risk of infection.  This is why sterile conditions are used for these blocks.  There are four possible types of infection. 1. Localized skin infection. 2. Central Nervous System Infection-This can be in the form of Meningitis, which can be deadly. 3. Epidural Infections-This can be in the form of an epidural abscess, which can cause pressure inside of the spine, causing compression of the spinal cord with subsequent paralysis. This would require an emergency surgery to  decompress, and there are no guarantees that the patient would recover from the paralysis. 4. Discitis-This is an infection of the intervertebral discs.  It occurs in about 1% of discography procedures.  It is difficult to treat and it may lead to surgery.        2. Pain: the needles have to go through skin and soft tissues, will cause soreness.       3. Damage to internal structures:  The nerves to be lesioned may be near blood vessels or    other nerves which can be potentially damaged.       4. Bleeding: Bleeding is more common if the patient is taking blood thinners such as  aspirin, Coumadin, Ticiid, Plavix, etc., or if he/she have some genetic predisposition  such as hemophilia. Bleeding into the spinal canal can cause compression of the spinal  cord with subsequent paralysis.  This would require an emergency surgery to  decompress and there are no guarantees that the patient would recover from the  paralysis.       5. Pneumothorax:  Puncturing of a lung is a possibility, every time a needle is introduced in  the area of the chest or upper back.  Pneumothorax refers to free air around the  collapsed lung(s), inside of the thoracic cavity (chest cavity).  Another two possible  complications related to a similar event would include: Hemothorax and Chylothorax.   These are variations of the Pneumothorax, where instead of air around the collapsed  lung(s), you may have blood or chyle, respectively.       6. Spinal headaches: They may occur with any procedures in the area of the spine.       7. Persistent CSF (Cerebro-Spinal Fluid) leakage: This is a rare problem, but may occur  with prolonged intrathecal or epidural catheters either due to the formation of a fistulous  track or a dural tear.       8. Nerve damage: By working so close to the spinal cord, there is always a possibility of  nerve damage, which could be as serious as a permanent spinal cord injury with  paralysis.       9. Death:  Although  rare, severe deadly allergic reactions known as "Anaphylactic  reaction" can occur to any of the medications used.      10. Worsening of the symptoms:  We can always make thing worse.  What are the chances of something like this happening? Chances of any of this occuring are extremely low.  By statistics, you have more of a chance of getting killed in a motor vehicle accident: while driving to the hospital than any of the above occurring .  Nevertheless, you should be aware that they are possibilities.  In general, it is similar to taking a shower.  Everybody knows that you can slip, hit your head and get killed.  Does that mean that you should not shower again?  Nevertheless always keep in mind that statistics do not mean anything if you happen to be on the wrong side of them.  Even if a procedure has a 1 (one) in a 1,000,000 (million) chance of going wrong, it you happen to be that one..Also, keep in mind that by statistics, you have more of a chance of having something go wrong when taking medications.  Who should not have this procedure? If you are on a blood thinning medication (e.g. Coumadin, Plavix, see list of "Blood Thinners"), or if you have an active infection going on, you should not have the procedure.  If you are taking any blood thinners, please inform your physician.  How should I prepare for this procedure?  Do not eat or drink anything at least six hours prior to the procedure.  Bring a driver with you .  It cannot be a taxi.  Come accompanied by an adult that can drive you back, and that is strong enough to help you if your legs get weak or numb from the local anesthetic.  Take all of your medicines the morning of the procedure with just enough water to swallow them.  If you have diabetes, make sure that you are scheduled to have your procedure done first thing in the morning, whenever possible.  If you have diabetes, take only half of your insulin dose and notify our nurse  that you have done so as soon as you arrive at the clinic.  If you are diabetic, but only take blood sugar pills (oral hypoglycemic), then do not take them on the morning of your procedure.  You may take them after you have had the procedure.  Do not take aspirin or any aspirin-containing medications, at least eleven (11) days prior to the procedure.  They may prolong bleeding.  Wear loose fitting clothing that may be easy to  take off and that you would not mind if it got stained with Betadine or blood.  Do not wear any jewelry or perfume  Remove any nail coloring.  It will interfere with some of our monitoring equipment.  NOTE: Remember that this is not meant to be interpreted as a complete list of all possible complications.  Unforeseen problems may occur.  BLOOD THINNERS The following drugs contain aspirin or other products, which can cause increased bleeding during surgery and should not be taken for 2 weeks prior to and 1 week after surgery.  If you should need take something for relief of minor pain, you may take acetaminophen which is found in Tylenol,m Datril, Anacin-3 and Panadol. It is not blood thinner. The products listed below are.  Do not take any of the products listed below in addition to any listed on your instruction sheet.  A.P.C or A.P.C with Codeine Codeine Phosphate Capsules #3 Ibuprofen Ridaura  ABC compound Congesprin Imuran rimadil  Advil Cope Indocin Robaxisal  Alka-Seltzer Effervescent Pain Reliever and Antacid Coricidin or Coricidin-D  Indomethacin Rufen  Alka-Seltzer plus Cold Medicine Cosprin Ketoprofen S-A-C Tablets  Anacin Analgesic Tablets or Capsules Coumadin Korlgesic Salflex  Anacin Extra Strength Analgesic tablets or capsules CP-2 Tablets Lanoril Salicylate  Anaprox Cuprimine Capsules Levenox Salocol  Anexsia-D Dalteparin Magan Salsalate  Anodynos Darvon compound Magnesium Salicylate Sine-off  Ansaid Dasin Capsules Magsal Sodium Salicylate  Anturane  Depen Capsules Marnal Soma  APF Arthritis pain formula Dewitt's Pills Measurin Stanback  Argesic Dia-Gesic Meclofenamic Sulfinpyrazone  Arthritis Bayer Timed Release Aspirin Diclofenac Meclomen Sulindac  Arthritis pain formula Anacin Dicumarol Medipren Supac  Analgesic (Safety coated) Arthralgen Diffunasal Mefanamic Suprofen  Arthritis Strength Bufferin Dihydrocodeine Mepro Compound Suprol  Arthropan liquid Dopirydamole Methcarbomol with Aspirin Synalgos  ASA tablets/Enseals Disalcid Micrainin Tagament  Ascriptin Doan's Midol Talwin  Ascriptin A/D Dolene Mobidin Tanderil  Ascriptin Extra Strength Dolobid Moblgesic Ticlid  Ascriptin with Codeine Doloprin or Doloprin with Codeine Momentum Tolectin  Asperbuf Duoprin Mono-gesic Trendar  Aspergum Duradyne Motrin or Motrin IB Triminicin  Aspirin plain, buffered or enteric coated Durasal Myochrisine Trigesic  Aspirin Suppositories Easprin Nalfon Trillsate  Aspirin with Codeine Ecotrin Regular or Extra Strength Naprosyn Uracel  Atromid-S Efficin Naproxen Ursinus  Auranofin Capsules Elmiron Neocylate Vanquish  Axotal Emagrin Norgesic Verin  Azathioprine Empirin or Empirin with Codeine Normiflo Vitamin E  Azolid Emprazil Nuprin Voltaren  Bayer Aspirin plain, buffered or children's or timed BC Tablets or powders Encaprin Orgaran Warfarin Sodium  Buff-a-Comp Enoxaparin Orudis Zorpin  Buff-a-Comp with Codeine Equegesic Os-Cal-Gesic   Buffaprin Excedrin plain, buffered or Extra Strength Oxalid   Bufferin Arthritis Strength Feldene Oxphenbutazone   Bufferin plain or Extra Strength Feldene Capsules Oxycodone with Aspirin   Bufferin with Codeine Fenoprofen Fenoprofen Pabalate or Pabalate-SF   Buffets II Flogesic Panagesic   Buffinol plain or Extra Strength Florinal or Florinal with Codeine Panwarfarin   Buf-Tabs Flurbiprofen Penicillamine   Butalbital Compound Four-way cold tablets Penicillin   Butazolidin Fragmin Pepto-Bismol   Carbenicillin  Geminisyn Percodan   Carna Arthritis Reliever Geopen Persantine   Carprofen Gold's salt Persistin   Chloramphenicol Goody's Phenylbutazone   Chloromycetin Haltrain Piroxlcam   Clmetidine heparin Plaquenil   Cllnoril Hyco-pap Ponstel   Clofibrate Hydroxy chloroquine Propoxyphen         Before stopping any of these medications, be sure to consult the physician who ordered them.  Some, such as Coumadin (Warfarin) are ordered to prevent or treat serious conditions such as "deep thrombosis", "pumonary  embolisms", and other heart problems.  The amount of time that you may need off of the medication may also vary with the medication and the reason for which you were taking it.  If you are taking any of these medications, please make sure you notify your pain physician before you undergo any procedures.         Sacroiliac (SI) Joint Injection Patient Information  Description: The sacroiliac joint connects the scrum (very low back and tailbone) to the ilium (a pelvic bone which also forms half of the hip joint).  Normally this joint experiences very little motion.  When this joint becomes inflamed or unstable low back and or hip and pelvis pain may result.  Injection of this joint with local anesthetics (numbing medicines) and steroids can provide diagnostic information and reduce pain.  This injection is performed with the aid of x-ray guidance into the tailbone area while you are lying on your stomach.   You may experience an electrical sensation down the leg while this is being done.  You may also experience numbness.  We also may ask if we are reproducing your normal pain during the injection.  Conditions which may be treated SI injection:   Low back, buttock, hip or leg pain  Preparation for the Injection:  1. Do not eat any solid food or dairy products within 8 hours of your appointment.  2. You may drink clear liquids up to 3 hours before appointment.  Clear liquids include water,  black coffee, juice or soda.  No milk or cream please. 3. You may take your regular medications, including pain medications with a sip of water before your appointment.  Diabetics should hold regular insulin (if take separately) and take 1/2 normal NPH dose the morning of the procedure.  Carry some sugar containing items with you to your appointment. 4. A driver must accompany you and be prepared to drive you home after your procedure. 5. Bring all of your current medications with you. 6. An IV may be inserted and sedation may be given at the discretion of the physician. 7. A blood pressure cuff, EKG and other monitors will often be applied during the procedure.  Some patients may need to have extra oxygen administered for a short period.  8. You will be asked to provide medical information, including your allergies, prior to the procedure.  We must know immediately if you are taking blood thinners (like Coumadin/Warfarin) or if you are allergic to IV iodine contrast (dye).  We must know if you could possible be pregnant.  Possible side effects:   Bleeding from needle site  Infection (rare, may require surgery)  Nerve injury (rare)  Numbness & tingling (temporary)  A brief convulsion or seizure  Light-headedness (temporary)  Pain at injection site (several days)  Decreased blood pressure (temporary)  Weakness in the leg (temporary)   Call if you experience:   New onset weakness or numbness of an extremity below the injection site that last more than 8 hours.  Hives or difficulty breathing ( go to the emergency room)  Inflammation or drainage at the injection site  Any new symptoms which are concerning to you  Please note:  Although the local anesthetic injected can often make your back/ hip/ buttock/ leg feel good for several hours after the injections, the pain will likely return.  It takes 3-7 days for steroids to work in the sacroiliac area.  You may not notice any pain  relief for at  least that one week.  If effective, we will often do a series of three injections spaced 3-6 weeks apart to maximally decrease your pain.  After the initial series, we generally will wait some months before a repeat injection of the same type.  If you have any questions, please call [phone] [facility]

## 2017-03-20 ENCOUNTER — Ambulatory Visit
Admission: RE | Admit: 2017-03-20 | Discharge: 2017-03-20 | Disposition: A | Discharge status: Home / Self Care | Payer: Medicare Other | Source: Ambulatory Visit | Attending: Student in an Organized Health Care Education/Training Program | Admitting: Student in an Organized Health Care Education/Training Program

## 2017-03-20 DIAGNOSIS — X58XXXS Exposure to other specified factors, sequela: Secondary | ICD-10-CM | POA: Insufficient documentation

## 2017-03-20 DIAGNOSIS — S46001S Unspecified injury of muscle(s) and tendon(s) of the rotator cuff of right shoulder, sequela: Secondary | ICD-10-CM | POA: Diagnosis not present

## 2017-03-20 DIAGNOSIS — M25511 Pain in right shoulder: Secondary | ICD-10-CM | POA: Diagnosis not present

## 2017-03-20 DIAGNOSIS — M19111 Post-traumatic osteoarthritis, right shoulder: Secondary | ICD-10-CM | POA: Diagnosis not present

## 2017-03-30 ENCOUNTER — Ambulatory Visit
Admission: RE | Admit: 2017-03-30 | Discharge: 2017-03-30 | Disposition: A | Discharge status: Home / Self Care | Payer: Medicare Other | Source: Ambulatory Visit | Attending: Student in an Organized Health Care Education/Training Program | Admitting: Student in an Organized Health Care Education/Training Program

## 2017-03-30 ENCOUNTER — Encounter: Payer: Self-pay | Admitting: Student in an Organized Health Care Education/Training Program

## 2017-03-30 ENCOUNTER — Ambulatory Visit (HOSPITAL_BASED_OUTPATIENT_CLINIC_OR_DEPARTMENT_OTHER): Payer: Medicare Other | Admitting: Student in an Organized Health Care Education/Training Program

## 2017-03-30 VITALS — BP 121/65 | HR 80 | Temp 98.3°F | Resp 14 | Ht 70.0 in | Wt 256.0 lb

## 2017-03-30 DIAGNOSIS — Z880 Allergy status to penicillin: Secondary | ICD-10-CM | POA: Diagnosis not present

## 2017-03-30 DIAGNOSIS — F419 Anxiety disorder, unspecified: Secondary | ICD-10-CM | POA: Diagnosis not present

## 2017-03-30 DIAGNOSIS — M4698 Unspecified inflammatory spondylopathy, sacral and sacrococcygeal region: Secondary | ICD-10-CM | POA: Diagnosis not present

## 2017-03-30 DIAGNOSIS — M47818 Spondylosis without myelopathy or radiculopathy, sacral and sacrococcygeal region: Secondary | ICD-10-CM

## 2017-03-30 DIAGNOSIS — Z881 Allergy status to other antibiotic agents status: Secondary | ICD-10-CM | POA: Diagnosis not present

## 2017-03-30 DIAGNOSIS — M47816 Spondylosis without myelopathy or radiculopathy, lumbar region: Secondary | ICD-10-CM | POA: Insufficient documentation

## 2017-03-30 DIAGNOSIS — Z981 Arthrodesis status: Secondary | ICD-10-CM | POA: Diagnosis not present

## 2017-03-30 DIAGNOSIS — R109 Unspecified abdominal pain: Secondary | ICD-10-CM | POA: Diagnosis not present

## 2017-03-30 DIAGNOSIS — Z7982 Long term (current) use of aspirin: Secondary | ICD-10-CM | POA: Insufficient documentation

## 2017-03-30 DIAGNOSIS — G894 Chronic pain syndrome: Secondary | ICD-10-CM

## 2017-03-30 DIAGNOSIS — Z7901 Long term (current) use of anticoagulants: Secondary | ICD-10-CM | POA: Diagnosis not present

## 2017-03-30 DIAGNOSIS — Z7952 Long term (current) use of systemic steroids: Secondary | ICD-10-CM | POA: Insufficient documentation

## 2017-03-30 DIAGNOSIS — Z791 Long term (current) use of non-steroidal anti-inflammatories (NSAID): Secondary | ICD-10-CM | POA: Insufficient documentation

## 2017-03-30 DIAGNOSIS — M5136 Other intervertebral disc degeneration, lumbar region: Secondary | ICD-10-CM

## 2017-03-30 DIAGNOSIS — Z79899 Other long term (current) drug therapy: Secondary | ICD-10-CM | POA: Diagnosis not present

## 2017-03-30 DIAGNOSIS — M25552 Pain in left hip: Secondary | ICD-10-CM | POA: Diagnosis present

## 2017-03-30 MED ORDER — LIDOCAINE HCL (PF) 1 % IJ SOLN
10.0000 mL | Freq: Once | INTRAMUSCULAR | Status: AC
  Administered 2017-03-30: 5 mL
  Filled 2017-03-30: qty 10

## 2017-03-30 MED ORDER — FENTANYL CITRATE (PF) 100 MCG/2ML IJ SOLN
25.0000 ug | INTRAMUSCULAR | Status: DC | PRN
  Administered 2017-03-30: 50 ug via INTRAVENOUS
  Filled 2017-03-30: qty 2

## 2017-03-30 MED ORDER — DEXAMETHASONE SODIUM PHOSPHATE 10 MG/ML IJ SOLN
10.0000 mg | Freq: Once | INTRAMUSCULAR | Status: AC
  Administered 2017-03-30: 10 mg
  Filled 2017-03-30: qty 1

## 2017-03-30 MED ORDER — LIDOCAINE 5 % EX PTCH: 1.0000 | MEDICATED_PATCH | CUTANEOUS | 0 refills | Status: DC

## 2017-03-30 MED ORDER — ROPIVACAINE HCL 2 MG/ML IJ SOLN
10.0000 mL | Freq: Once | INTRAMUSCULAR | Status: AC
  Administered 2017-03-30: 10 mL
  Filled 2017-03-30: qty 10

## 2017-03-30 NOTE — Patient Instructions (Signed)
A [prescription for Lidoderm Patch was sent to your pharmacy. Pain Management Discharge Instructions  General Discharge Instructions :  If you need to reach your doctor call: Monday-Friday 8:00 am - 4:00 pm at [phone] or toll free [phone].  After clinic hours [phone] to have operator reach doctor.  Bring all of your medication bottles to all your appointments in the pain clinic.  To cancel or reschedule your appointment with Pain Management please remember to call 24 hours in advance to avoid a fee.  Refer to the educational materials which you have been given on: General Risks, I had my Procedure. Discharge Instructions, Post Sedation.  Post Procedure Instructions:  The drugs you were given will stay in your system until tomorrow, so for the next 24 hours you should not drive, make any legal decisions or drink any alcoholic beverages.  You may eat anything you prefer, but it is better to start with liquids then soups and crackers, and gradually work up to solid foods.  Please notify your doctor immediately if you have any unusual bleeding, trouble breathing or pain that is not related to your normal pain.  Depending on the type of procedure that was done, some parts of your body may feel week and/or numb.  This usually clears up by tonight or the next day.  Walk with the use of an assistive device or accompanied by an adult for the 24 hours.  You may use ice on the affected area for the first 24 hours.  Put ice in a Ziploc bag and cover with a towel and place against area 15 minutes on 15 minutes off.  You may switch to heat after 24 hours.

## 2017-03-30 NOTE — Progress Notes (Signed)
Patient's Name: Michele Moore  MRN: [id number]  Referring Provider: Juline Patch, MD  DOB: 1950/03/15  PCP: Juline Patch, MD  DOS: 03/30/2017  Note by: Gillis Santa, MD  Service setting: Ambulatory outpatient  Specialty: Interventional Pain Management  Patient type: Established  Location: [facility] (AMB) Pain Management Facility  Visit type: Interventional Procedure   Primary Reason for Visit: Interventional Pain Management Treatment. CC: Hip Pain (left) and Abdominal Pain (left thoracic area)  Procedure:  Anesthesia, Analgesia, Anxiolysis:  Type: Diagnostic Sacroiliac Joint Steroid Injection Region: Inferior Lumbosacral Region Level: PSIS (Posterior Superior Iliac Spine) Laterality: Bilateral  Type: Local Anesthesia with Moderate (Conscious) Sedation Local Anesthetic: Lidocaine 1% Route: Intravenous (IV) IV Access: Secured Sedation: Meaningful verbal contact was maintained at all times during the procedure  Indication(s): Analgesia and Anxiety  Indications: 1. SI joint arthritis (Michele Moore)   2. Chronic pain syndrome   3. Lumbar degenerative disc disease   4. Lumbar spondylosis    Pain Score: Pre-procedure: 8 /10 Post-procedure: 5 /10  Pre-op Assessment:  [name] is a 67 y.o. (year old), female patient, seen today for interventional treatment. She  has a past surgical history that includes Carpal tunnel release; Rotator cuff repair; Ankle surgery; Tubal ligation; Colonoscopy (2000?); Upper gi endoscopy (2000?); Joint replacement; Tonsillectomy; and Fusion of talonavicular joint (Right, 08/03/2015). [name] has a current medication list which includes the following prescription(s): aspirin, vitamin d3, clindamycin, ferrous sulfate, fexofenadine-pseudoephedrine, gabapentin, hydralazine, hydrocodone-acetaminophen, ibuprofen, ketoconazole, levothyroxine, meloxicam, multiple vitamins-calcium, mupirocin cream, omega-3 acid ethyl esters, omeprazole, polyethyl glycol-propyl glycol, polyethylene glycol,  potassium chloride sa, ranitidine, tizanidine, triamcinolone cream, triamterene-hydrochlorothiazide, trimethoprim-polymyxin b, enoxaparin, lidocaine, methylprednisolone, and prednisone, and the following Facility-Administered Medications: fentanyl. Her primarily concern today is the Hip Pain (left) and Abdominal Pain (left thoracic area)  Initial Vital Signs: Blood pressure 130/78, pulse 62, temperature 98.4 F (36.9 C), temperature source Oral, resp. rate 16, height 5\' 11"  (1.803 m), weight 240 lb (108.9 kg), SpO2 98 %. BMI: Estimated body mass index is 36.73 kg/m as calculated from the following:   Height as of this encounter: 5\' 10"  (1.778 m).   Weight as of this encounter: 256 lb (116.1 kg).  Risk Assessment: Allergies: Reviewed. She is allergic to ampicillin; penicillins; and vibramycin [doxycycline calcium].  Allergy Precautions: None required Coagulopathies: Reviewed. None identified.  Blood-thinner therapy: None at this time Active Infection(s): Reviewed. None identified. [name] is afebrile  Site Confirmation: [name] was asked to confirm the procedure and laterality before marking the site Procedure checklist: Completed Consent: Before the procedure and under the influence of no sedative(s), amnesic(s), or anxiolytics, the patient was informed of the treatment options, risks and possible complications. To fulfill our ethical and legal obligations, as recommended by the American Medical Association's Code of Ethics, I have informed the patient of my clinical impression; the nature and purpose of the treatment or procedure; the risks, benefits, and possible complications of the intervention; the alternatives, including doing nothing; the risk(s) and benefit(s) of the alternative treatment(s) or procedure(s); and the risk(s) and benefit(s) of doing nothing. The patient was provided information about the general risks and possible complications associated with the procedure. These may  include, but are not limited to: failure to achieve desired goals, infection, bleeding, organ or nerve damage, allergic reactions, paralysis, and death. In addition, the patient was informed of those risks and complications associated to the procedure, such as failure to decrease pain; infection; bleeding; organ or nerve damage with subsequent damage to sensory,  motor, and/or autonomic systems, resulting in permanent pain, numbness, and/or weakness of one or several areas of the body; allergic reactions; (i.e.: anaphylactic reaction); and/or death. Furthermore, the patient was informed of those risks and complications associated with the medications. These include, but are not limited to: allergic reactions (i.e.: anaphylactic or anaphylactoid reaction(s)); adrenal axis suppression; blood sugar elevation that in diabetics may result in ketoacidosis or comma; water retention that in patients with history of congestive heart failure may result in shortness of breath, pulmonary edema, and decompensation with resultant heart failure; weight gain; swelling or edema; medication-induced neural toxicity; particulate matter embolism and blood vessel occlusion with resultant organ, and/or nervous system infarction; and/or aseptic necrosis of one or more joints. Finally, the patient was informed that Medicine is not an exact science; therefore, there is also the possibility of unforeseen or unpredictable risks and/or possible complications that may result in a catastrophic outcome. The patient indicated having understood very clearly. We have given the patient no guarantees and we have made no promises. Enough time was given to the patient to ask questions, all of which were answered to the patient's satisfaction. [name] has indicated that she wanted to continue with the procedure. Attestation: I, the ordering provider, attest that I have discussed with the patient the benefits, risks, side-effects, alternatives,  likelihood of achieving goals, and potential problems during recovery for the procedure that I have provided informed consent. Date: 03/30/2017; Time: 9:58 AM  Pre-Procedure Preparation:  Monitoring: As per clinic protocol. Respiration, ETCO2, SpO2, BP, heart rate and rhythm monitor placed and checked for adequate function Safety Precautions: Patient was assessed for positional comfort and pressure points before starting the procedure. Time-out: I initiated and conducted the "Time-out" before starting the procedure, as per protocol. The patient was asked to participate by confirming the accuracy of the "Time Out" information. Verification of the correct person, site, and procedure were performed and confirmed by me, the nursing staff, and the patient. "Time-out" conducted as per Joint Commission's Universal Protocol (UP.01.01.01). "Time-out" Date & Time: 03/30/2017; 0950 hrs.  Description of Procedure Process:  Position: Prone Target Area: Superior, posterior, aspect of the sacroiliac fissure Approach: Posterior, paraspinal, ipsilateral approach. Area Prepped: Entire Lower Lumbosacral Region Prepping solution: ChloraPrep (2% chlorhexidine gluconate and 70% isopropyl alcohol) Safety Precautions: Aspiration looking for blood return was conducted prior to all injections. At no point did we inject any substances, as a needle was being advanced. No attempts were made at seeking any paresthesias. Safe injection practices and needle disposal techniques used. Medications properly checked for expiration dates. SDV (single dose vial) medications used. Description of the Procedure: Protocol guidelines were followed. The patient was placed in position over the procedure table. The target area was identified and the area prepped in the usual manner. Skin & deeper tissues infiltrated with local anesthetic. Appropriate amount of time allowed to pass for local anesthetics to take effect. The procedure needle was  advanced under fluoroscopic guidance into the sacroiliac joint until a firm endpoint was obtained. Proper needle placement secured. Negative aspiration confirmed. Solution injected in intermittent fashion, asking for systemic symptoms every 0.5cc of injectate. The needles were then removed and the area cleansed, making sure to leave some of the prepping solution back to take advantage of its long term bactericidal properties. Vitals:   03/30/17 0957 03/30/17 1007 03/30/17 1016 03/30/17 1025  BP: 134/76 126/64 122/70 121/65  Pulse:      Resp: 11 16 16 14   Temp:  TempSrc:      SpO2: 98% 95% 96% 97%  Weight:      Height:        Start Time: 0950 hrs. End Time: 0956 hrs. Materials:  Needle(s) Type: Regular needle Gauge: 22G Length: 3.5-in Medication(s): We administered fentaNYL, lidocaine (PF), ropivacaine (PF) 2 mg/mL (0.2%), and dexamethasone. Please see chart orders for dosing details. 9 cc of 0.2% ropivacaine, 1 cc of Decadron (10mg /cc); 2.5 cc in each joint, 2.5 cc peri-articular/joint  Imaging Guidance (Non-Spinal):  Type of Imaging Technique: Fluoroscopy Guidance (Non-Spinal) Indication(s): Assistance in needle guidance and placement for procedures requiring needle placement in or near specific anatomical locations not easily accessible without such assistance. Exposure Time: Please see nurses notes. Contrast: Before injecting any contrast, we confirmed that the patient did not have an allergy to iodine, shellfish, or radiological contrast. Once satisfactory needle placement was completed at the desired level, radiological contrast was injected. Contrast injected under live fluoroscopy. No contrast complications. See chart for type and volume of contrast used. Fluoroscopic Guidance: I was personally present during the use of fluoroscopy. "Tunnel Vision Technique" used to obtain the best possible view of the target area. Parallax error corrected before commencing the procedure.  "Direction-depth-direction" technique used to introduce the needle under continuous pulsed fluoroscopy. Once target was reached, antero-posterior, oblique, and lateral fluoroscopic projection used confirm needle placement in all planes. Images permanently stored in EMR. Interpretation: I personally interpreted the imaging intraoperatively. Adequate needle placement confirmed in multiple planes. Appropriate spread of contrast into desired area was observed. No evidence of afferent or efferent intravascular uptake. Permanent images saved into the patient's record.  Antibiotic Prophylaxis:  Indication(s): None identified Antibiotic given: None  Post-operative Assessment:  EBL: None Complications: No immediate post-treatment complications observed by team, or reported by patient. Note: The patient tolerated the entire procedure well. A repeat set of vitals were taken after the procedure and the patient was kept under observation following institutional policy, for this type of procedure. Post-procedural neurological assessment was performed, showing return to baseline, prior to discharge. The patient was provided with post-procedure discharge instructions, including a section on how to identify potential problems. Should any problems arise concerning this procedure, the patient was given instructions to immediately contact us, at any time, without hesitation. In any case, we plan to contact the patient by telephone for a follow-up status report regarding this interventional procedure. Comments:  No additional relevant information.  Plan of Care  Disposition: Discharge home  Discharge Date & Time: 03/30/2017; 1030 hrs.  5/5 strength b/l HIP flexion/extension.  Patient's right shoulder MRI was also reviewed with her today.  Continues to show supraspinatus tendinosis and degenerative changes after her rotator cuff surgery.  We discussed doing a right shoulder injection after her follow-up.  I will also  prescribe the patient lidocaine patches that she can apply to her left thoracic region.  She is endorsing pain with inspiration that seems pleuritic in nature.  She states that she has been coughing over the last couple of weeks.  I told her to apply the lidocaine patches to her left chest region.  Physician-requested Follow-up:  Return in about 4 weeks (around 04/27/2017) for Post Procedure Evaluation, Medication Management.    Future Appointments  Date Time Provider [place]  04/21/2017  9:30 AM Gillis Santa, MD [facility] None    Imaging Orders     DG C-Arm 1-60 Min-No Report Procedure Orders    No procedure(s) ordered today    Medications ordered  for procedure: Meds ordered this encounter  Medications  . fentaNYL (SUBLIMAZE) injection 25-100 mcg    Make sure Narcan is available in the pyxis when using this medication. In the event of respiratory depression (RR< 8/min): Titrate NARCAN (naloxone) in increments of 0.1 to 0.2 mg IV at 2-3 minute intervals, until desired degree of reversal.  . lidocaine (PF) (XYLOCAINE) 1 % injection 10 mL  . ropivacaine (PF) 2 mg/mL (0.2%) (NAROPIN) injection 10 mL  . dexamethasone (DECADRON) injection 10 mg  . lidocaine (LIDODERM) 5 %    Sig: Place 1 patch onto the skin daily. Remove & Discard patch within 12 hours or as directed by MD    Dispense:  30 patch    Refill:  0   Medications administered: We administered fentaNYL, lidocaine (PF), ropivacaine (PF) 2 mg/mL (0.2%), and dexamethasone.  See the medical record for exact dosing, route, and time of administration.  New Prescriptions   LIDOCAINE (LIDODERM) 5 %    Place 1 patch onto the skin daily. Remove & Discard patch within 12 hours or as directed by MD   Primary Care Physician: Juline Patch, MD Location: [facility] Note by: Gillis Santa, MD Date: 03/30/2017; Time: 10:37 AM  Disclaimer:  Medicine is not an exact science. The only guarantee in  medicine is that nothing is guaranteed. It is important to note that the decision to proceed with this intervention was based on the information collected from the patient. The Data and conclusions were drawn from the patient's questionnaire, the interview, and the physical examination. Because the information was provided in large part by the patient, it cannot be guaranteed that it has not been purposely or unconsciously manipulated. Every effort has been made to obtain as much relevant data as possible for this evaluation. It is important to note that the conclusions that lead to this procedure are derived in large part from the available data. Always take into account that the treatment will also be dependent on availability of resources and existing treatment guidelines, considered by other Pain Management Practitioners as being common knowledge and practice, at the time of the intervention. For Medico-Legal purposes, it is also important to point out that variation in procedural techniques and pharmacological choices are the acceptable norm. The indications, contraindications, technique, and results of the above procedure should only be interpreted and judged by a Board-Certified Interventional Pain Specialist with extensive familiarity and expertise in the same exact procedure and technique.

## 2017-03-30 NOTE — Progress Notes (Signed)
Safety precautions to be maintained throughout the outpatient stay will include: orient to surroundings, keep bed in low position, maintain call bell within reach at all times, provide assistance with transfer out of bed and ambulation.  

## 2017-03-31 ENCOUNTER — Telehealth: Payer: Self-pay | Admitting: *Deleted

## 2017-03-31 ENCOUNTER — Telehealth: Payer: Self-pay

## 2017-03-31 NOTE — Telephone Encounter (Signed)
No problems post procedure. 

## 2017-03-31 NOTE — Telephone Encounter (Signed)
PA sent yesterday. Patient notified.

## 2017-03-31 NOTE — Telephone Encounter (Signed)
Patient called and left a vm saying her medication needs prior auth. You should have already received a fax from the pharmacy.

## 2017-04-01 ENCOUNTER — Encounter: Payer: Self-pay | Admitting: *Deleted

## 2017-04-01 ENCOUNTER — Ambulatory Visit
Admission: EM | Admit: 2017-04-01 | Discharge: 2017-04-01 | Disposition: A | Discharge status: Home / Self Care | Payer: Medicare Other | Attending: Family Medicine | Admitting: Family Medicine

## 2017-04-01 ENCOUNTER — Other Ambulatory Visit: Payer: Self-pay

## 2017-04-01 DIAGNOSIS — M940 Chondrocostal junction syndrome [Tietze]: Secondary | ICD-10-CM | POA: Diagnosis not present

## 2017-04-01 MED ORDER — PREDNISONE 20 MG PO TABS: ORAL_TABLET | ORAL | 0 refills | Status: DC

## 2017-04-01 NOTE — ED Provider Notes (Signed)
[facility]    CSN: [id number] Arrival date & time: 04/01/17  [name]     History   Chief Complaint Chief Complaint  Patient presents with  . Rib Injury    HPI Michele Moore is a 67 y.o. female.   67 yo female with a c/o left sided rib pain for 2 days. States pain began after twisting her body while she was in the bathroom. Denies any fall, direct traumatic injury, fevers, chills, cough, shortness of breath. States pain is worse with position changes.     The history is provided by the patient.    Past Medical History:  Diagnosis Date  . Anemia   . Arthritis   . Chronic kidney disease    STAGE 3 PER DR EASON 08/02/15  . Family history of adverse reaction to anesthesia    sister difficult to put to sleep  . GERD (gastroesophageal reflux disease)   . H/O tooth extraction    all lower teeth 1/19  . Hemorrhoid   . History of hiatal hernia   . Hypertension   . Migraines   . Mixed hyperlipidemia   . Multiple gastric ulcers   . Thyroid disease     Patient Active Problem List   Diagnosis Date Noted  . Pain, lower extremity 08/03/2015    Past Surgical History:  Procedure Laterality Date  . ANKLE SURGERY    . CARPAL TUNNEL RELEASE     x3  . COLONOSCOPY  2000?  . FUSION OF TALONAVICULAR JOINT Right 08/03/2015   Procedure: TAILOR NAVICULAR JOINT FUSION - RIGHT ;  Surgeon: [name], DPM;  Location: [facility] ORS;  Service: Podiatry;  Laterality: Right;  . JOINT REPLACEMENT     knee x 3 ,right x1 and left x2  . ROTATOR CUFF REPAIR    . TONSILLECTOMY    . TUBAL LIGATION    . UPPER GI ENDOSCOPY  2000?    OB History    Gravida Para Term Preterm AB Living   4             SAB TAB Ectopic Multiple Live Births                  Obstetric Comments   1st Menstrual Cycle:  16 1st Pregnancy:  19       Home Medications    Prior to Admission medications   Medication Sig Start Date End Date Taking? Authorizing Provider  aspirin 81 MG tablet Take 81 mg by mouth  daily.   Yes [provider]  Cholecalciferol (VITAMIN D3) 2000 units capsule Take 2,000 Units by mouth daily.   Yes [provider]  ferrous sulfate 325 (65 FE) MG tablet Take 325 mg by mouth daily with breakfast. otc   Yes [provider]  fexofenadine-pseudoephedrine (ALLEGRA-D 24) 180-240 MG 24 hr tablet Take by mouth. otc 05/01/15  Yes [provider]  gabapentin (NEURONTIN) 300 MG capsule TAKE 2 CAPSULES BY MOUTH NIGHTLY/ [place] 01/22/17  Yes Gillis Santa, MD  hydrALAZINE (APRESOLINE) 50 MG tablet Take 1 tablet (50 mg total) by mouth 2 (two) times daily. 12/23/16  Yes Juline Patch, MD  HYDROcodone-acetaminophen (NORCO) 7.5-325 MG tablet Take 1 tablet by mouth 3 (three) times daily as needed for severe pain. For Chronic Pain DNF: 03/27/17 03/17/17  Yes Gillis Santa, MD  ketoconazole (NIZORAL) 2 % cream U UTD QHS 11/22/14  Yes [provider]  levothyroxine (SYNTHROID) 200 MCG tablet Take 1 tablet (200  mcg total) by mouth daily before breakfast. 12/23/16  Yes Juline Patch, MD  Multiple Vitamins-Calcium (ONE-A-DAY WOMENS FORMULA PO) Take 1 tablet by mouth daily.   Yes [provider]  omega-3 acid ethyl esters (LOVAZA) 1 g capsule Take 2 capsules (2 g total) by mouth 2 (two) times daily. 12/23/16  Yes Juline Patch, MD  omeprazole (PRILOSEC) 40 MG capsule Take 1 capsule (40 mg total) by mouth daily. 12/23/16  Yes Juline Patch, MD  Polyethyl Glycol-Propyl Glycol (SYSTANE OP) Apply to eye.   Yes [provider]  polyethylene glycol (MIRALAX / GLYCOLAX) packet Take 17 g by mouth daily. 08/07/15  Yes [name], DPM  potassium chloride SA (K-DUR,KLOR-CON) 20 MEQ tablet Take 1 tablet (20 mEq total) by mouth daily. 12/23/16  Yes Juline Patch, MD  ranitidine (ZANTAC) 150 MG tablet Take 1 tablet (150 mg total) by mouth 2 (two) times daily. 12/23/16  Yes Juline Patch, MD  tiZANidine (ZANAFLEX) 4 MG capsule Take 1 capsule (4 mg  total) by mouth 2 (two) times daily as needed. 01/22/17  Yes Gillis Santa, MD  triamcinolone cream (KENALOG) 0.5 % Apply topically. 08/18/16  Yes [provider]  triamterene-hydrochlorothiazide (MAXZIDE) 75-50 MG tablet Take 1 tablet by mouth daily. 12/23/16  Yes Juline Patch, MD  clindamycin (CLEOCIN) 300 MG capsule Take 1 capsule 4 (four) times daily by mouth. 12/31/16   [provider]  enoxaparin (LOVENOX) 40 MG/0.4ML injection Inject 0.4 mLs (40 mg total) into the skin daily. Patient not taking: Reported on 12/23/2016 08/17/15   [name], DPM  ibuprofen (ADVIL,MOTRIN) 200 MG tablet Take 400 mg by mouth as needed. otc    [provider]  lidocaine (LIDODERM) 5 % Place 1 patch onto the skin daily. Remove & Discard patch within 12 hours or as directed by MD 03/30/17   Gillis Santa, MD  meloxicam (MOBIC) 15 MG tablet Take 1 tablet (15 mg total) by mouth daily. 02/21/15   Cuthriell, Charline Bills, PA-C  methylPREDNISolone (MEDROL DOSEPAK) 4 MG TBPK tablet TK PO UTD 12/31/16   [provider]  mupirocin cream (BACTROBAN) 2 % Apply 1 application topically 2 (two) times daily. 07/05/16   Johnn Hai, PA-C  predniSONE (DELTASONE) 20 MG tablet 2 tabs po qd x 7 days 04/01/17   Norval Gable, MD  trimethoprim-polymyxin b Mayra Neer) ophthalmic solution  10/13/14   [provider]    Family History Family History  Problem Relation Age of Onset  . COPD Sister   . Cancer Maternal Aunt        breast  . Cancer Maternal Uncle        kidney    Social History Social History   Tobacco Use  . Smoking status: Former Smoker    Last attempt to quit: 02/24/1993    Years since quitting: 24.1  . Smokeless tobacco: Never Used  Substance Use Topics  . Alcohol use: No    Alcohol/week: 0.0 oz  . Drug use: No     Allergies   Ampicillin; Penicillins; and Vibramycin [doxycycline calcium]   Review of Systems Review of Systems   Physical Exam Triage  Vital Signs ED Triage Vitals  Enc Vitals Group     BP 04/01/17 1901 128/69     Pulse Rate 04/01/17 1901 (!) 53     Resp 04/01/17 1901 16     Temp 04/01/17 1901 98.2 F (36.8 C)     Temp Source 04/01/17 1901  Oral     SpO2 04/01/17 1901 100 %     Weight --      Height --      Head Circumference --      Peak Flow --      Pain Score 04/01/17 1903 10     Pain Loc --      Pain Edu? --      Excl. in [place]? --    No data found.  Updated Vital Signs BP 128/69 (BP Location: Left Arm)   Pulse (!) 53   Temp 98.2 F (36.8 C) (Oral)   Resp 16   SpO2 100%   Visual Acuity Right Eye Distance:   Left Eye Distance:   Bilateral Distance:    Right Eye Near:   Left Eye Near:    Bilateral Near:     Physical Exam  Constitutional: She appears well-developed and well-nourished. No distress.  Cardiovascular: Normal rate, regular rhythm and normal heart sounds.  Pulmonary/Chest: Effort normal and breath sounds normal. No stridor. No respiratory distress. She has no wheezes. She has no rales. She exhibits tenderness (to palpation over the left lower ribs/intercostal ).  Skin: She is not diaphoretic.  Nursing note and vitals reviewed.    UC Treatments / Results  Labs (all labs ordered are listed, but only abnormal results are displayed) Labs Reviewed - No data to display  EKG  EKG Interpretation None       Radiology No results found.  Procedures Procedures (including critical care time)  Medications Ordered in UC Medications - No data to display   Initial Impression / Assessment and Plan / UC Course  I have reviewed the triage vital signs and the nursing notes.  Pertinent labs & imaging results that were available during my care of the patient were reviewed by me and considered in my medical decision making (see chart for details).       Final Clinical Impressions(s) / UC Diagnoses   Final diagnoses:  Costochondritis    ED Discharge Orders        Ordered     predniSONE (DELTASONE) 20 MG tablet     04/01/17 1928     1. diagnosis reviewed with patient 2. rx as per orders above; reviewed possible side effects, interactions, risks and benefits  3. Recommend supportive treatment with rest, ice 4. Follow-up prn if symptoms worsen or don't improve   Controlled Substance Prescriptions [place] Controlled Substance Registry consulted? Not Applicable   Norval Gable, MD 04/01/17 Joen Laura

## 2017-04-01 NOTE — ED Triage Notes (Signed)
Patient started having left side flank pain 4 days ago. Mechanism of injury unknown.

## 2017-04-21 ENCOUNTER — Encounter: Payer: Self-pay | Admitting: Student in an Organized Health Care Education/Training Program

## 2017-04-21 ENCOUNTER — Ambulatory Visit
Payer: Medicare Other | Attending: Student in an Organized Health Care Education/Training Program | Admitting: Student in an Organized Health Care Education/Training Program

## 2017-04-21 ENCOUNTER — Other Ambulatory Visit: Payer: Self-pay

## 2017-04-21 VITALS — BP 123/75 | HR 81 | Temp 98.7°F | Resp 16 | Ht 71.0 in | Wt 255.0 lb

## 2017-04-21 DIAGNOSIS — M25511 Pain in right shoulder: Secondary | ICD-10-CM | POA: Insufficient documentation

## 2017-04-21 DIAGNOSIS — M5136 Other intervertebral disc degeneration, lumbar region: Secondary | ICD-10-CM

## 2017-04-21 DIAGNOSIS — Z7982 Long term (current) use of aspirin: Secondary | ICD-10-CM | POA: Diagnosis not present

## 2017-04-21 DIAGNOSIS — Z79899 Other long term (current) drug therapy: Secondary | ICD-10-CM | POA: Diagnosis not present

## 2017-04-21 DIAGNOSIS — D649 Anemia, unspecified: Secondary | ICD-10-CM | POA: Diagnosis not present

## 2017-04-21 DIAGNOSIS — K219 Gastro-esophageal reflux disease without esophagitis: Secondary | ICD-10-CM | POA: Diagnosis not present

## 2017-04-21 DIAGNOSIS — M549 Dorsalgia, unspecified: Secondary | ICD-10-CM | POA: Insufficient documentation

## 2017-04-21 DIAGNOSIS — N189 Chronic kidney disease, unspecified: Secondary | ICD-10-CM | POA: Insufficient documentation

## 2017-04-21 DIAGNOSIS — M48062 Spinal stenosis, lumbar region with neurogenic claudication: Secondary | ICD-10-CM | POA: Diagnosis not present

## 2017-04-21 DIAGNOSIS — Z79890 Hormone replacement therapy (postmenopausal): Secondary | ICD-10-CM | POA: Diagnosis not present

## 2017-04-21 DIAGNOSIS — I129 Hypertensive chronic kidney disease with stage 1 through stage 4 chronic kidney disease, or unspecified chronic kidney disease: Secondary | ICD-10-CM | POA: Insufficient documentation

## 2017-04-21 DIAGNOSIS — M5416 Radiculopathy, lumbar region: Secondary | ICD-10-CM | POA: Insufficient documentation

## 2017-04-21 DIAGNOSIS — Z880 Allergy status to penicillin: Secondary | ICD-10-CM | POA: Diagnosis not present

## 2017-04-21 DIAGNOSIS — G894 Chronic pain syndrome: Secondary | ICD-10-CM | POA: Diagnosis not present

## 2017-04-21 DIAGNOSIS — M25512 Pain in left shoulder: Secondary | ICD-10-CM | POA: Insufficient documentation

## 2017-04-21 DIAGNOSIS — E079 Disorder of thyroid, unspecified: Secondary | ICD-10-CM | POA: Diagnosis not present

## 2017-04-21 DIAGNOSIS — E782 Mixed hyperlipidemia: Secondary | ICD-10-CM | POA: Diagnosis not present

## 2017-04-21 DIAGNOSIS — M5116 Intervertebral disc disorders with radiculopathy, lumbar region: Secondary | ICD-10-CM | POA: Diagnosis not present

## 2017-04-21 DIAGNOSIS — M51369 Other intervertebral disc degeneration, lumbar region without mention of lumbar back pain or lower extremity pain: Secondary | ICD-10-CM | POA: Insufficient documentation

## 2017-04-21 DIAGNOSIS — Z87891 Personal history of nicotine dependence: Secondary | ICD-10-CM | POA: Diagnosis not present

## 2017-04-21 MED ORDER — HYDROCODONE-ACETAMINOPHEN 7.5-325 MG PO TABS: 1.0000 | ORAL_TABLET | Freq: Three times a day (TID) | ORAL | 0 refills | Status: DC | PRN

## 2017-04-21 NOTE — Patient Instructions (Addendum)
You have been given 2 Rx for Hydrocodone/APAP to last until 06/26/2017  GENERAL RISKS AND COMPLICATIONS  What are the risk, side effects and possible complications? Generally speaking, most procedures are safe.  However, with any procedure there are risks, side effects, and the possibility of complications.  The risks and complications are dependent upon the sites that are lesioned, or the type of nerve block to be performed.  The closer the procedure is to the spine, the more serious the risks are.  Great care is taken when placing the radio frequency needles, block needles or lesioning probes, but sometimes complications can occur. 1. Infection: Any time there is an injection through the skin, there is a risk of infection.  This is why sterile conditions are used for these blocks.  There are four possible types of infection. 1. Localized skin infection. 2. Central Nervous System Infection-This can be in the form of Meningitis, which can be deadly. 3. Epidural Infections-This can be in the form of an epidural abscess, which can cause pressure inside of the spine, causing compression of the spinal cord with subsequent paralysis. This would require an emergency surgery to decompress, and there are no guarantees that the patient would recover from the paralysis. 4. Discitis-This is an infection of the intervertebral discs.  It occurs in about 1% of discography procedures.  It is difficult to treat and it may lead to surgery.        2. Pain: the needles have to go through skin and soft tissues, will cause soreness.       3. Damage to internal structures:  The nerves to be lesioned may be near blood vessels or    other nerves which can be potentially damaged.       4. Bleeding: Bleeding is more common if the patient is taking blood thinners such as  aspirin, Coumadin, Ticiid, Plavix, etc., or if he/she have some genetic predisposition  such as hemophilia. Bleeding into the spinal canal can cause  compression of the spinal  cord with subsequent paralysis.  This would require an emergency surgery to  decompress and there are no guarantees that the patient would recover from the  paralysis.       5. Pneumothorax:  Puncturing of a lung is a possibility, every time a needle is introduced in  the area of the chest or upper back.  Pneumothorax refers to free air around the  collapsed lung(s), inside of the thoracic cavity (chest cavity).  Another two possible  complications related to a similar event would include: Hemothorax and Chylothorax.   These are variations of the Pneumothorax, where instead of air around the collapsed  lung(s), you may have blood or chyle, respectively.       6. Spinal headaches: They may occur with any procedures in the area of the spine.       7. Persistent CSF (Cerebro-Spinal Fluid) leakage: This is a rare problem, but may occur  with prolonged intrathecal or epidural catheters either due to the formation of a fistulous  track or a dural tear.       8. Nerve damage: By working so close to the spinal cord, there is always a possibility of  nerve damage, which could be as serious as a permanent spinal cord injury with  paralysis.       9. Death:  Although rare, severe deadly allergic reactions known as "Anaphylactic  reaction" can occur to any of the medications used.  10. Worsening of the symptoms:  We can always make thing worse.  What are the chances of something like this happening? Chances of any of this occuring are extremely low.  By statistics, you have more of a chance of getting killed in a motor vehicle accident: while driving to the hospital than any of the above occurring .  Nevertheless, you should be aware that they are possibilities.  In general, it is similar to taking a shower.  Everybody knows that you can slip, hit your head and get killed.  Does that mean that you should not shower again?  Nevertheless always keep in mind that statistics do not mean  anything if you happen to be on the wrong side of them.  Even if a procedure has a 1 (one) in a 1,000,000 (million) chance of going wrong, it you happen to be that one..Also, keep in mind that by statistics, you have more of a chance of having something go wrong when taking medications.  Who should not have this procedure? If you are on a blood thinning medication (e.g. Coumadin, Plavix, see list of "Blood Thinners"), or if you have an active infection going on, you should not have the procedure.  If you are taking any blood thinners, please inform your physician.  How should I prepare for this procedure?  Do not eat or drink anything at least six hours prior to the procedure.  Bring a driver with you .  It cannot be a taxi.  Come accompanied by an adult that can drive you back, and that is strong enough to help you if your legs get weak or numb from the local anesthetic.  Take all of your medicines the morning of the procedure with just enough water to swallow them.  If you have diabetes, make sure that you are scheduled to have your procedure done first thing in the morning, whenever possible.  If you have diabetes, take only half of your insulin dose and notify our nurse that you have done so as soon as you arrive at the clinic.  If you are diabetic, but only take blood sugar pills (oral hypoglycemic), then do not take them on the morning of your procedure.  You may take them after you have had the procedure.  Do not take aspirin or any aspirin-containing medications, at least eleven (11) days prior to the procedure.  They may prolong bleeding.  Wear loose fitting clothing that may be easy to take off and that you would not mind if it got stained with Betadine or blood.  Do not wear any jewelry or perfume  Remove any nail coloring.  It will interfere with some of our monitoring equipment.  NOTE: Remember that this is not meant to be interpreted as a complete list of all possible  complications.  Unforeseen problems may occur.  BLOOD THINNERS The following drugs contain aspirin or other products, which can cause increased bleeding during surgery and should not be taken for 2 weeks prior to and 1 week after surgery.  If you should need take something for relief of minor pain, you may take acetaminophen which is found in Tylenol,m Datril, Anacin-3 and Panadol. It is not blood thinner. The products listed below are.  Do not take any of the products listed below in addition to any listed on your instruction sheet.  A.P.C or A.P.C with Codeine Codeine Phosphate Capsules #3 Ibuprofen Ridaura  ABC compound Congesprin Imuran rimadil  Advil Cope Indocin Robaxisal  Alka-Seltzer Effervescent Pain Reliever and Antacid Coricidin or Coricidin-D  Indomethacin Rufen  Alka-Seltzer plus Cold Medicine Cosprin Ketoprofen S-A-C Tablets  Anacin Analgesic Tablets or Capsules Coumadin Korlgesic Salflex  Anacin Extra Strength Analgesic tablets or capsules CP-2 Tablets Lanoril Salicylate  Anaprox Cuprimine Capsules Levenox Salocol  Anexsia-D Dalteparin Magan Salsalate  Anodynos Darvon compound Magnesium Salicylate Sine-off  Ansaid Dasin Capsules Magsal Sodium Salicylate  Anturane Depen Capsules Marnal Soma  APF Arthritis pain formula Dewitt's Pills Measurin Stanback  Argesic Dia-Gesic Meclofenamic Sulfinpyrazone  Arthritis Bayer Timed Release Aspirin Diclofenac Meclomen Sulindac  Arthritis pain formula Anacin Dicumarol Medipren Supac  Analgesic (Safety coated) Arthralgen Diffunasal Mefanamic Suprofen  Arthritis Strength Bufferin Dihydrocodeine Mepro Compound Suprol  Arthropan liquid Dopirydamole Methcarbomol with Aspirin Synalgos  ASA tablets/Enseals Disalcid Micrainin Tagament  Ascriptin Doan's Midol Talwin  Ascriptin A/D Dolene Mobidin Tanderil  Ascriptin Extra Strength Dolobid Moblgesic Ticlid  Ascriptin with Codeine Doloprin or Doloprin with Codeine Momentum Tolectin  Asperbuf Duoprin  Mono-gesic Trendar  Aspergum Duradyne Motrin or Motrin IB Triminicin  Aspirin plain, buffered or enteric coated Durasal Myochrisine Trigesic  Aspirin Suppositories Easprin Nalfon Trillsate  Aspirin with Codeine Ecotrin Regular or Extra Strength Naprosyn Uracel  Atromid-S Efficin Naproxen Ursinus  Auranofin Capsules Elmiron Neocylate Vanquish  Axotal Emagrin Norgesic Verin  Azathioprine Empirin or Empirin with Codeine Normiflo Vitamin E  Azolid Emprazil Nuprin Voltaren  Bayer Aspirin plain, buffered or children's or timed BC Tablets or powders Encaprin Orgaran Warfarin Sodium  Buff-a-Comp Enoxaparin Orudis Zorpin  Buff-a-Comp with Codeine Equegesic Os-Cal-Gesic   Buffaprin Excedrin plain, buffered or Extra Strength Oxalid   Bufferin Arthritis Strength Feldene Oxphenbutazone   Bufferin plain or Extra Strength Feldene Capsules Oxycodone with Aspirin   Bufferin with Codeine Fenoprofen Fenoprofen Pabalate or Pabalate-SF   Buffets II Flogesic Panagesic   Buffinol plain or Extra Strength Florinal or Florinal with Codeine Panwarfarin   Buf-Tabs Flurbiprofen Penicillamine   Butalbital Compound Four-way cold tablets Penicillin   Butazolidin Fragmin Pepto-Bismol   Carbenicillin Geminisyn Percodan   Carna Arthritis Reliever Geopen Persantine   Carprofen Gold's salt Persistin   Chloramphenicol Goody's Phenylbutazone   Chloromycetin Haltrain Piroxlcam   Clmetidine heparin Plaquenil   Cllnoril Hyco-pap Ponstel   Clofibrate Hydroxy chloroquine Propoxyphen         Before stopping any of these medications, be sure to consult the physician who ordered them.  Some, such as Coumadin (Warfarin) are ordered to prevent or treat serious conditions such as "deep thrombosis", "pumonary embolisms", and other heart problems.  The amount of time that you may need off of the medication may also vary with the medication and the reason for which you were taking it.  If you are taking any of these medications, please  make sure you notify your pain physician before you undergo any procedures.         Epidural Steroid Injection Patient Information  Description: The epidural space surrounds the nerves as they exit the spinal cord.  In some patients, the nerves can be compressed and inflamed by a bulging disc or a tight spinal canal (spinal stenosis).  By injecting steroids into the epidural space, we can bring irritated nerves into direct contact with a potentially helpful medication.  These steroids act directly on the irritated nerves and can reduce swelling and inflammation which often leads to decreased pain.  Epidural steroids may be injected anywhere along the spine and from the neck to the low back depending upon the location of your pain.   After  numbing the skin with local anesthetic (like Novocaine), a small needle is passed into the epidural space slowly.  You may experience a sensation of pressure while this is being done.  The entire block usually last less than 10 minutes.  Conditions which may be treated by epidural steroids:   Low back and leg pain  Neck and arm pain  Spinal stenosis  Post-laminectomy syndrome  Herpes zoster (shingles) pain  Pain from compression fractures  Preparation for the injection:  1. Do not eat any solid food or dairy products within 8 hours of your appointment.  2. You may drink clear liquids up to 3 hours before appointment.  Clear liquids include water, black coffee, juice or soda.  No milk or cream please. 3. You may take your regular medication, including pain medications, with a sip of water before your appointment  Diabetics should hold regular insulin (if taken separately) and take 1/2 normal NPH dos the morning of the procedure.  Carry some sugar containing items with you to your appointment. 4. A driver must accompany you and be prepared to drive you home after your procedure.  5. Bring all your current medications with your. 6. An IV may be  inserted and sedation may be given at the discretion of the physician.   7. A blood pressure cuff, EKG and other monitors will often be applied during the procedure.  Some patients may need to have extra oxygen administered for a short period. 8. You will be asked to provide medical information, including your allergies, prior to the procedure.  We must know immediately if you are taking blood thinners (like Coumadin/Warfarin)  Or if you are allergic to IV iodine contrast (dye). We must know if you could possible be pregnant.  Possible side-effects:  Bleeding from needle site  Infection (rare, may require surgery)  Nerve injury (rare)  Numbness & tingling (temporary)  Difficulty urinating (rare, temporary)  Spinal headache ( a headache worse with upright posture)  Light -headedness (temporary)  Pain at injection site (several days)  Decreased blood pressure (temporary)  Weakness in arm/leg (temporary)  Pressure sensation in back/neck (temporary)  Call if you experience:  Fever/chills associated with headache or increased back/neck pain.  Headache worsened by an upright position.  New onset weakness or numbness of an extremity below the injection site  Hives or difficulty breathing (go to the emergency room)  Inflammation or drainage at the infection site  Severe back/neck pain  Any new symptoms which are concerning to you  Please note:  Although the local anesthetic injected can often make your back or neck feel good for several hours after the injection, the pain will likely return.  It takes 3-7 days for steroids to work in the epidural space.  You may not notice any pain relief for at least that one week.  If effective, we will often do a series of three injections spaced 3-6 weeks apart to maximally decrease your pain.  After the initial series, we generally will wait several months before considering a repeat injection of the same type.  If you have any  questions, please call [phone] [facility]____________________________________________________________________________________________  Genicular Nerve Block  What is a genicular nerve block? A genicular nerve block is the injection of a local anesthetic to block the nerves that transmits pain from the knee.  What is the purpose of a facet nerve block? A genicular nerve block is a diagnostic procedure to determine if the  pathologic changes (i.e. arthritis, meniscal tears, etc) and inflammation within the knee joint is the source of your knee pain. It also confirms that the knee pain will respond well to the actual treatment procedure. If a genicular nerve block works, it will give you relief for several hours. After that, the pain is expected to return to normal. This test is always performed twice (usually a week or two apart) because two successful tests are required to move onto treatment. If both diagnostic tests are positive, then we schedule a treatment called radiofrequency (RF) ablation. In this procedure, the same nerves are cauterized, which typically leads to pain relief for 4 -18 months. If this process works well for one knee, it can be performed on the other knee if needed.  How is the procedure performed? You will be placed on the procedure table. The injection site is sterilized with either iodine or chlorhexadine. The site to be injected is numbed with a local anesthetic, and a needle is directed to the target area. X-ray guidance is used to ensure proper placement and positioning of the needle. When the needle is properly positioned near the genicular nerve, local anesthetic is injected to numb that nerve. This will be repeated at multiple sites around the knee to block all genicular nerves.  Will the procedure be painful? The injection can be painful and we therefore provide the option of receiving IV sedation. IV sedation, combined with local  anesthetic, can make the injection nearly pain free. It allows you to remain very still during the procedure, which can also make the injection easier, faster, and more successful. If you decide to have IV sedation, you must have a driver to get you home safely afterwards. In addition, you cannot have anything to eat or drink within 8 hours of your appointment (clear liquids are allowed until 3 hours before the procedure). If you take medications for diabetes, these medications may need to be adjusted the morning of the procedure. Your primary care physician can help you with this adjustment.  What are the discharge instructions? If you received IV sedation do not drive or operate machinery for at least 24 hours after the procedure. You may return to work the next day following your procedure. You may resume your normal diet immediately. Do not engage in any strenuous activity for 24 hours. You should, however, engage in moderate activity that typically causes your ususal pain. If the block works, those activities should not be painful for several hours after the injection. Do not take a bath, swim, or use a hot tub for 24 hours (you may take a shower). Call the office if you have any of the following: severe pain afterwards (different than your usual symptoms), redness/swelling/discharge at the injection site(s), fevers/chills, difficulty with bowel or bladder functions.  What are the risks and side effects? The complication rate for this procedure is very low. Whenever a needle enters the skin, bleeding or infection can occur. Some other serious but extremely rare risks include paralysis and death. You may have an allergic reaction to any of the medications used. If you have a known allergy to any medications, especially local anesthetics, notify our staff before the procedure takes place. You may experience any of the following side effects up to 4 - 6 hours after the procedure:  Leg muscle weakness or  numbness may occur due to the local anesthetic affecting the nerves that control your legs (this is a temporary affect and it is not  paralysis). If you have any leg weakness or numbness, walk only with assistance in order to prevent falls and injury. Your leg strength will return slowly and completely.  Dizziness may occur due to a decrease in your blood pressure. If this occurs, remain in a seated or lying position. Gradually sit up, and then stand after at least 10 minutes of sitting.  Mild headaches may occur. Drink fluids and take pain medications if needed. If the headaches persist or become severe, call the office.  Mild discomfort at the injection site can occur. This typically lasts for a few hours but can persist for a couple days. If this occurs, take anti-inflammatories or pain medications, apply ice to the area the day of the procedure. If it persists, apply moist heat in the day(s) following.  The side effects listed above can be normal. They are not dangerous and will resolve on their own. If, however, you experience any of the following, a complication may have occurred and you should either contact your doctor. If he is not readily available, then you should proceed to the closest urgent care center for evaluation:  Severe or progressive pain at the injection site(s)  Arm or leg weakness that progressively worsens or persists for longer than 8 hours  Severe or progressive redness, swelling, or discharge from the injections site(s)  Fevers, chills, nausea, or vomiting  Bowel or bladder dysfunction (i.e. inability to urinate or pass stool or difficulty controlling either)  How long does it take for the procedure to work? You should feel relief from your usual pain within the first hour. Again, this is only expected to last for several hours, at the most. Remember, you may be sore in the middle part of your back from the needles, and you must distinguish this from your usual  pain. ____________________________________________________________________________________________

## 2017-04-21 NOTE — Progress Notes (Signed)
Patient's Name: Michele Moore  MRN: [id number]  Referring Provider: Juline Patch, MD  DOB: 03-25-1950  PCP: Juline Patch, MD  DOS: 04/21/2017  Note by: Gillis Santa, MD  Service setting: Ambulatory outpatient  Specialty: Interventional Pain Management  Location: [facility] (AMB) Pain Management Facility    Patient type: Established   Primary Reason(s) for Visit: Encounter for prescription drug management & post-procedure evaluation of chronic illness with mild to moderate exacerbation(Level of risk: moderate) CC: Shoulder Pain (right) and Back Pain (left)  HPI  Michele Moore is a 67 y.o. year old, female patient, who comes today for a post-procedure evaluation and medication management. She has Pain, lower extremity; Lumbar radiculopathy; Lumbar degenerative disc disease; Chronic pain syndrome; and Spinal stenosis, lumbar region, with neurogenic claudication on their problem list. Her primarily concern today is the Shoulder Pain (right) and Back Pain (left)  Pain Assessment: Location: Right Shoulder Radiating: denies; left lower back pain radiates to left hip down back of leg to ankle Onset: More than a month ago Duration: Chronic pain Quality: Sharp Severity: 7 /10 (self-reported pain score)  Note: Reported level is inconsistent with clinical observations. Clinically the patient looks like a 3/10 A 3/10 is viewed as "Moderate" and described as significantly interfering with activities of daily living (ADL). It becomes difficult to feed, bathe, get dressed, get on and off the toilet or to perform personal hygiene functions. Difficult to get in and out of bed or a chair without assistance. Very distracting. With effort, it can be ignored when deeply involved in activities.       When using our objective Pain Scale, levels between 6 and 10/10 are said to belong in an emergency room, as it progressively worsens from a 6/10, described as severely limiting, requiring emergency care not usually available at an  outpatient pain management facility. At a 6/10 level, communication becomes difficult and requires great effort. Assistance to reach the emergency department may be required. Facial flushing and profuse sweating along with potentially dangerous increases in heart rate and blood pressure will be evident. Effect on ADL: pain is worse when pt stands, walks and bends over Timing: Intermittent Modifying factors: procedure, medications  Michele Moore was last seen on 03/30/2017 for a procedure. During today's appointment we reviewed Michele Moore's 67 post-procedure results, as well as her outpatient medication regimen.  Further details on both, my assessment(s), as well as the proposed treatment plan, please see below.  Controlled Substance Pharmacotherapy Assessment REMS (Risk Evaluation and Mitigation Strategy)  Analgesic: Hydrocodone 7.5 mg TID prn, #90/month MME/day: 22.5 mg/day.  Rise Patience  04/21/2017  9:38 AM  Sign at close encounter Nursing Pain Medication Assessment:  Safety precautions to be maintained throughout the outpatient stay will include: orient to surroundings, keep bed in low position, maintain call bell within reach at all times, provide assistance with transfer out of bed and ambulation.  Medication Inspection Compliance: Pill count conducted under aseptic conditions, in front of the patient. Neither the pills nor the bottle was removed from the patient's sight at any time. Once count was completed pills were immediately returned to the patient in their original bottle.  Medication: Hydrocodone/APAP Pill/Patch Count: 20 of 90 pills remain Pill/Patch Appearance: Markings consistent with prescribed medication Bottle Appearance: Standard pharmacy container. Clearly labeled. Filled Date: 2 / 3 / 2019 Last Medication intake:  Today   Pharmacokinetics: Liberation and absorption (onset of action): WNL Distribution (time to peak effect): WNL Metabolism and excretion (duration of action): WNL  Pharmacodynamics: Desired effects: Analgesia: [name] reports >50% benefit. Functional ability: Patient reports that medication allows her to accomplish basic ADLs Clinically meaningful improvement in function (CMIF): Sustained CMIF goals met Perceived effectiveness: Described as relatively effective, allowing for increase in activities of daily living (ADL) Undesirable effects: Side-effects or Adverse reactions: None reported Monitoring: [place] PMP: Online review of the past 65-monthperiod conducted. Compliant with practice rules and regulations Last UDS on record: Summary  Date Value Ref Range Status  10/14/2016 FINAL  Final    Comment:    ==================================================================== [facility] COMP DRUG ANALYSIS,UR ==================================================================== Test                             Result       Flag       Units Drug Present and Declared for Prescription Verification   Gabapentin                     PRESENT      EXPECTED   Acetaminophen                  PRESENT      EXPECTED Drug Present not Declared for Prescription Verification   Dextrorphan/Levorphanol        PRESENT      UNEXPECTED    Dextrorphan is an expected metabolite of dextromethorphan, an    over-the-counter or prescription cough suppressant. Levorphanol    is a scheduled prescription medication. Dextrorphan cannot be    distinguished from levorphanol by the method used for analysis. Drug Absent but Declared for Prescription Verification   Hydrocodone                    Not Detected UNEXPECTED ng/mg creat   Oxycodone                      Not Detected UNEXPECTED ng/mg creat   Ephedrine/Pseudoephedrine      Not Detected UNEXPECTED   Tramadol                       Not Detected UNEXPECTED   Pregabalin                     Not Detected UNEXPECTED   Tizanidine                     Not Detected UNEXPECTED    Tizanidine, as indicated in the declared medication list,  is not    always detected even when used as directed.   Duloxetine                     Not Detected UNEXPECTED   Salicylate                     Not Detected UNEXPECTED    Aspirin, as indicated in the declared medication list, is not    always detected even when used as directed.   Chlorpheniramine               Not Detected UNEXPECTED   Hydroxyzine                    Not Detected UNEXPECTED   Lidocaine                      Not Detected UNEXPECTED  Lidocaine, as indicated in the declared medication list, is not    always detected even when used as directed. ==================================================================== Test                      Result    Flag   Units      Ref Range   Creatinine              101              mg/dL      >=20 ==================================================================== Declared Medications:  The flagging and interpretation on this report are based on the  following declared medications.  Unexpected results may arise from  inaccuracies in the declared medications.  **Note: The testing scope of this panel includes these medications:  Chlorpheniramine (Tussionex)  Duloxetine  Gabapentin  Hydrocodone (Hydrocodone-Acetaminophen)  Hydrocodone (Tussionex)  Hydroxyzine  Oxycodone (Oxycodone Acetaminophen)  Pregabalin  Pseudoephedrine (Fexofenadine Pseudoephedrine)  Tramadol  **Note: The testing scope of this panel does not include small to  moderate amounts of these reported medications:  Acetaminophen  Acetaminophen (Hydrocodone-Acetaminophen)  Acetaminophen (Oxycodone Acetaminophen)  Aspirin  Lidocaine  Tizanidine  **Note: The testing scope of this panel does not include following  reported medications:  Cefuroxime axetil  Fexofenadine (Fexofenadine Pseudoephedrine)  Hydralazine  Hydrochlorothiazide (Triamterine-Hydrochlorthzide)  Ketoconazole  Levothyroxine  Meloxicam  Mupirocin  Nystatin  Omeprazole  Polyethylene Glycol   Polymyxin  Potassium  Prednisone  Ranitidine  Supplement (Omega-3)  Topical  Triamcinolone acetonide  Triamterene (Triamterine-Hydrochlorthzide)  Trimethoprim ==================================================================== For clinical consultation, please call [phone]. ====================================================================    UDS interpretation: Compliant          Medication Assessment Form: Reviewed. Patient indicates being compliant with therapy Treatment compliance: Compliant Risk Assessment Profile: Aberrant behavior: See prior evaluations. None observed or detected today Comorbid factors increasing risk of overdose: See prior notes. No additional risks detected today Risk of substance use disorder (SUD): Low Opioid Risk Tool - 04/21/17 0953      Family History of Substance Abuse   Alcohol  Negative    Illegal Drugs  Negative    Rx Drugs  Negative      Personal History of Substance Abuse   Alcohol  Negative    Illegal Drugs  Negative    Rx Drugs  Negative      Age   Age between 41-45 years   No      Psychological Disease   Psychological Disease  Negative    Depression  Negative      Total Score   Opioid Risk Tool Scoring  0    Opioid Risk Interpretation  Low Risk      ORT Scoring interpretation table:  Score <3 = Low Risk for SUD  Score between 4-7 = Moderate Risk for SUD  Score >8 = High Risk for Opioid Abuse   Risk Mitigation Strategies:  Patient Counseling: Covered Patient-Prescriber Agreement (PPA): Present and active  Notification to other healthcare providers: Done  Pharmacologic Plan: No change in therapy, at this time.             Post-Procedure Assessment  03/30/2017 Procedure: Bilateral SI joint injection Pre-procedure pain score:  8/10 Post-procedure pain score: 5/10         Influential Factors: BMI: 35.57 kg/m Intra-procedural challenges: None observed.         Assessment challenges: None detected.               Reported side-effects: None.  Post-procedural adverse reactions or complications: None reported         Sedation: Please see nurses note. When no sedatives are used, the analgesic levels obtained are directly associated to the effectiveness of the local anesthetics. However, when sedation is provided, the level of analgesia obtained during the initial 1 hour following the intervention, is believed to be the result of a combination of factors. These factors may include, but are not limited to: 1. The effectiveness of the local anesthetics used. 2. The effects of the analgesic(s) and/or anxiolytic(s) used. 3. The degree of discomfort experienced by the patient at the time of the procedure. 4. The patients ability and reliability in recalling and recording the events. 5. The presence and influence of possible secondary gains and/or psychosocial factors. Reported result: Relief experienced during the 1st hour after the procedure: 100 % (Ultra-Short Term Relief)            Interpretative annotation: Clinically appropriate result. Analgesia during this period is likely to be Local Anesthetic and/or IV Sedative (Analgesic/Anxiolytic) related.          Effects of local anesthetic: The analgesic effects attained during this period are directly associated to the localized infiltration of local anesthetics and therefore cary significant diagnostic value as to the etiological location, or anatomical origin, of the pain. Expected duration of relief is directly dependent on the pharmacodynamics of the local anesthetic used. Long-acting (4-6 hours) anesthetics used.  Reported result: Relief during the next 4 to 6 hour after the procedure: 95 % (Short-Term Relief)            Interpretative annotation: Clinically appropriate result. Analgesia during this period is likely to be Local Anesthetic-related.          Long-term benefit: Defined as the period of time past the expected duration of local anesthetics (1  hour for short-acting and 4-6 hours for long-acting). With the possible exception of prolonged sympathetic blockade from the local anesthetics, benefits during this period are typically attributed to, or associated with, other factors such as analgesic sensory neuropraxia, antiinflammatory effects, or beneficial biochemical changes provided by agents other than the local anesthetics.  Reported result: Extended relief following procedure: 100 % (Long-Term Relief)            Interpretative annotation: Clinically appropriate result. Good relief. No permanent benefit expected. Inflammation plays a part in the etiology to the pain.          Current benefits: Defined as reported results that persistent at this point in time.   Analgesia: 50-75 %            Function: Somewhat improved ROM: Somewhat improved Interpretative annotation: Ongoing benefit. Therapeutic benefit observed. Effective diagnostic intervention.          Interpretation: Results would suggest a successful diagnostic and therapeutic intervention.                  Plan:  Please see "Plan of Care" for details.                Laboratory Chemistry  Inflammation Markers (CRP: Acute Phase) (ESR: Chronic Phase) No results found for: CRP, ESRSEDRATE, LATICACIDVEN                       Rheumatology Markers No results found for: RF, ANA, LABURIC, URICUR, LYMEIGGIGMAB, [facility]              Renal Function Markers Lab Results  Component Value Date  BUN 30 (H) 12/23/2016   CREATININE 1.96 (H) 12/23/2016   GFRAA 30 (L) 12/23/2016   GFRNONAA 26 (L) 12/23/2016                 Hepatic Function Markers Lab Results  Component Value Date   AST 21 12/27/2013   ALT 19 12/27/2013   ALBUMIN 4.5 12/23/2016   ALKPHOS 91 12/27/2013                 Electrolytes Lab Results  Component Value Date   NA 141 12/23/2016   K 4.5 12/23/2016   CL 103 12/23/2016   CALCIUM 9.7 12/23/2016   MG 2.2 12/27/2013   PHOS 3.0 12/23/2016                         Neuropathy Markers No results found for: VITAMINB12, FOLATE, HGBA1C, HIV               Bone Pathology Markers No results found for: VD25OH, VD125OH2TOT, ME2683MH9, QQ2297LG9, 25OHVITD1, 25OHVITD2, 25OHVITD3, TESTOFREE, TESTOSTERONE                       Coagulation Parameters Lab Results  Component Value Date   PLT 150 08/03/2015                 Cardiovascular Markers Lab Results  Component Value Date   CKTOTAL 154 12/27/2013   CKMB 0.8 12/27/2013   TROPONINI < 0.02 12/27/2013   HGB 10.6 (L) 08/03/2015   HCT 32.4 (L) 08/03/2015                 CA Markers No results found for: CEA, CA125, LABCA2               Note: Lab results reviewed.  Recent Diagnostic Imaging Results  DG C-Arm 1-60 Min-No Report Fluoroscopy was utilized by the requesting physician.  No radiographic  interpretation.   Complexity Note: Imaging results reviewed. Results shared with Ms. Stroud, using Layman's terms.                         Meds   Current Outpatient Medications:  .  aspirin 81 MG tablet, Take 81 mg by mouth daily., Disp: , Rfl:  .  Cholecalciferol (VITAMIN D3) 2000 units capsule, Take 2,000 Units by mouth daily., Disp: , Rfl:  .  ferrous sulfate 325 (65 FE) MG tablet, Take 325 mg by mouth daily with breakfast. otc, Disp: , Rfl:  .  fexofenadine-pseudoephedrine (ALLEGRA-D 24) 180-240 MG 24 hr tablet, Take by mouth. otc, Disp: , Rfl:  .  gabapentin (NEURONTIN) 300 MG capsule, TAKE 2 CAPSULES BY MOUTH NIGHTLY/ Zema Lizardo, Disp: 60 capsule, Rfl: 1 .  hydrALAZINE (APRESOLINE) 50 MG tablet, Take 1 tablet (50 mg total) by mouth 2 (two) times daily., Disp: 60 tablet, Rfl: 5 .  HYDROcodone-acetaminophen (NORCO) 7.5-325 MG tablet, Take 1 tablet by mouth 3 (three) times daily as needed for severe pain. For Chronic Pain DNF: 04/27/17, 05/27/17, Disp: 90 tablet, Rfl: 0 .  ibuprofen (ADVIL,MOTRIN) 200 MG tablet, Take 400 mg by mouth as needed. otc, Disp: , Rfl:  .  ketoconazole (NIZORAL) 2 % cream, U  UTD QHS, Disp: , Rfl:  .  levothyroxine (SYNTHROID) 200 MCG tablet, Take 1 tablet (200 mcg total) by mouth daily before breakfast., Disp: 30 tablet, Rfl: 5 .  Multiple Vitamins-Calcium (ONE-A-DAY WOMENS FORMULA PO), Take 1 tablet  by mouth daily., Disp: , Rfl:  .  mupirocin cream (BACTROBAN) 2 %, Apply 1 application topically 2 (two) times daily., Disp: 15 g, Rfl: 0 .  omega-3 acid ethyl esters (LOVAZA) 1 g capsule, Take 2 capsules (2 g total) by mouth 2 (two) times daily., Disp: 120 capsule, Rfl: 5 .  omeprazole (PRILOSEC) 40 MG capsule, Take 1 capsule (40 mg total) by mouth daily., Disp: 30 capsule, Rfl: 5 .  Polyethyl Glycol-Propyl Glycol (SYSTANE OP), Apply to eye., Disp: , Rfl:  .  polyethylene glycol (MIRALAX / GLYCOLAX) packet, Take 17 g by mouth daily., Disp: 14 each, Rfl: 0 .  potassium chloride SA (K-DUR,KLOR-CON) 20 MEQ tablet, Take 1 tablet (20 mEq total) by mouth daily., Disp: 30 tablet, Rfl: 5 .  ranitidine (ZANTAC) 150 MG tablet, Take 1 tablet (150 mg total) by mouth 2 (two) times daily., Disp: 60 tablet, Rfl: 5 .  tiZANidine (ZANAFLEX) 4 MG capsule, Take 1 capsule (4 mg total) by mouth 2 (two) times daily as needed., Disp: 60 capsule, Rfl: 2 .  triamcinolone cream (KENALOG) 0.5 %, Apply topically., Disp: , Rfl:  .  triamterene-hydrochlorothiazide (MAXZIDE) 75-50 MG tablet, Take 1 tablet by mouth daily., Disp: 30 tablet, Rfl: 5 .  trimethoprim-polymyxin b (POLYTRIM) ophthalmic solution, , Disp: , Rfl:  .  clindamycin (CLEOCIN) 300 MG capsule, Take 1 capsule 4 (four) times daily by mouth., Disp: , Rfl: 0 .  methylPREDNISolone (MEDROL DOSEPAK) 4 MG TBPK tablet, TK PO UTD, Disp: , Rfl: 0  ROS  Constitutional: Denies any fever or chills Gastrointestinal: No reported hemesis, hematochezia, vomiting, or acute GI distress Musculoskeletal: Denies any acute onset joint swelling, redness, loss of ROM, or weakness Neurological: No reported episodes of acute onset apraxia, aphasia, dysarthria,  agnosia, amnesia, paralysis, loss of coordination, or loss of consciousness  Allergies  Ms. Cossey is allergic to ampicillin; penicillins; and vibramycin [doxycycline calcium].  PFSH  Drug: Ms. Pinkett  reports that she does not use drugs. Alcohol:  reports that she does not drink alcohol. Tobacco:  reports that she quit smoking about 24 years ago. she has never used smokeless tobacco. Medical:  has a past medical history of Anemia, Arthritis, Chronic kidney disease, Family history of adverse reaction to anesthesia, GERD (gastroesophageal reflux disease), H/O tooth extraction, Hemorrhoid, History of hiatal hernia, Hypertension, Migraines, Mixed hyperlipidemia, Multiple gastric ulcers, and Thyroid disease. Surgical: Ms. Bushong  has a past surgical history that includes Carpal tunnel release; Rotator cuff repair; Ankle surgery; Tubal ligation; Colonoscopy (2000?); Upper gi endoscopy (2000?); Joint replacement; Tonsillectomy; and Fusion of talonavicular joint (Right, 08/03/2015). Family: family history includes COPD in her sister; Cancer in her maternal aunt and maternal uncle.  Constitutional Exam  General appearance: Well nourished, well developed, and well hydrated. In no apparent acute distress Vitals:   04/21/17 0936  BP: 123/75  Pulse: 81  Resp: 16  Temp: 98.7 F (37.1 C)  TempSrc: Oral  SpO2: 99%  Weight: 255 lb (115.7 kg)  Height: _0  (1.803 m)   BMI Assessment: Estimated body mass index is 35.57 kg/m as calculated from the following:   Height as of this encounter: _1  (1.803 m).   Weight as of this encounter: 255 lb (115.7 kg).  BMI interpretation table: BMI level Category Range association with higher incidence of chronic pain  <18 kg/m2 Underweight   18.5-24.9 kg/m2 Ideal body weight   25-29.9 kg/m2 Overweight Increased incidence by 20%  30-34.9 kg/m2 Obese (Class I) Increased incidence by  68%  35-39.9 kg/m2 Severe obesity (Class II) Increased incidence by 136%  >40 kg/m2  Extreme obesity (Class III) Increased incidence by 254%   BMI Readings from Last 4 Encounters:  04/21/17 35.57 kg/m  03/30/17 36.73 kg/m  03/17/17 35.70 kg/m  01/22/17 35.57 kg/m   Wt Readings from Last 4 Encounters:  04/21/17 255 lb (115.7 kg)  03/30/17 256 lb (116.1 kg)  03/17/17 256 lb (116.1 kg)  01/22/17 255 lb (115.7 kg)  Psych/Mental status: Alert, oriented x 3 (person, place, & time)       Eyes: PERLA Respiratory: No evidence of acute respiratory distress  Cervical Spine Area Exam  Skin & Axial Inspection: No masses, redness, edema, swelling, or associated skin lesions Alignment: Symmetrical Functional ROM: Unrestricted ROM      Stability: No instability detected Muscle Tone/Strength: Functionally intact. No obvious neuro-muscular anomalies detected. Sensory (Neurological): Unimpaired Palpation: No palpable anomalies              Upper Extremity (UE) Exam    Side: Right upper extremity  Side: Left upper extremity  Skin & Extremity Inspection: Skin color, temperature, and hair growth are WNL. No peripheral edema or cyanosis. No masses, redness, swelling, asymmetry, or associated skin lesions. No contractures.  Skin & Extremity Inspection: Skin color, temperature, and hair growth are WNL. No peripheral edema or cyanosis. No masses, redness, swelling, asymmetry, or associated skin lesions. No contractures.  Functional ROM: Unrestricted ROM          Functional ROM: Unrestricted ROM          Muscle Tone/Strength: Functionally intact. No obvious neuro-muscular anomalies detected.  Muscle Tone/Strength: Functionally intact. No obvious neuro-muscular anomalies detected.  Sensory (Neurological): Unimpaired          Sensory (Neurological): Unimpaired          Palpation: No palpable anomalies              Palpation: No palpable anomalies              Specialized Test(s): Deferred         Specialized Test(s): Deferred          Thoracic Spine Area Exam  Skin & Axial Inspection: No  masses, redness, or swelling Alignment: Symmetrical Functional ROM: Unrestricted ROM Stability: No instability detected Muscle Tone/Strength: Functionally intact. No obvious neuro-muscular anomalies detected. Sensory (Neurological): Unimpaired Muscle strength & Tone: No palpable anomalies  Lumbar Spine Area Exam  Skin & Axial Inspection: No masses, redness, or swelling Alignment: Symmetrical Functional ROM: Unrestricted ROM      Stability: No instability detected Muscle Tone/Strength: Functionally intact. No obvious neuro-muscular anomalies detected. Sensory (Neurological): Unimpaired Palpation: No palpable anomalies       Provocative Tests: Lumbar Hyperextension and rotation test: Positive bilaterally for facet joint pain. Lumbar Lateral bending test: Positive ipsilateral radicular pain, bilaterally. Positive for bilateral foraminal stenosis. Patrick's Maneuver: evaluation deferred today                    Gait & Posture Assessment  Ambulation: Unassisted Gait: Relatively normal for age and body habitus Posture: WNL   Lower Extremity Exam    Side: Right lower extremity  Side: Left lower extremity  Skin & Extremity Inspection: Skin color, temperature, and hair growth are WNL. No peripheral edema or cyanosis. No masses, redness, swelling, asymmetry, or associated skin lesions. No contractures.  Skin & Extremity Inspection: Skin color, temperature, and hair growth are WNL. No peripheral edema or cyanosis. No  masses, redness, swelling, asymmetry, or associated skin lesions. No contractures.  Functional ROM: Unrestricted ROM          Functional ROM: Unrestricted ROM          Muscle Tone/Strength: Functionally intact. No obvious neuro-muscular anomalies detected.  Muscle Tone/Strength: Functionally intact. No obvious neuro-muscular anomalies detected.  Sensory (Neurological): Unimpaired  Sensory (Neurological): Unimpaired  Palpation: No palpable anomalies  Palpation: No palpable anomalies    Assessment  Primary Diagnosis & Pertinent Problem List: The primary encounter diagnosis was Lumbar radiculopathy. Diagnoses of Lumbar degenerative disc disease, Chronic pain syndrome, and Spinal stenosis, lumbar region, with neurogenic claudication were also pertinent to this visit.  Status Diagnosis  Having a Flare-up Persistent Persistent 1. Lumbar radiculopathy   2. Lumbar degenerative disc disease   3. Chronic pain syndrome   4. Spinal stenosis, lumbar region, with neurogenic claudication     General Recommendations: The pain condition that the patient suffers from is best treated with a multidisciplinary approach that involves an increase in physical activity to prevent de-conditioning and worsening of the pain cycle, as well as psychological counseling (formal and/or informal) to address the co-morbid psychological affects of pain. Treatment will often involve judicious use of pain medications and interventional procedures to decrease the pain, allowing the patient to participate in the physical activity that will ultimately produce long-lasting pain reductions. The goal of the multidisciplinary approach is to return the patient to a higher level of overall function and to restore their ability to perform activities of daily living.  67 year old female who presents for follow-up status post bilateral SI joint injections with improvement in her buttock and groin pain.  Patient is continuing to endorse left greater than right leg pain which originates from her back and extends down the lateral portion of her leg to her anterior shin.  Her pain is consistent with bilateral L4-L5 lumbar radiculopathy.  Patient also has MRI evidence of lumbar degenerative disc disease resulting in neuroforaminal stenosis associated with her lumbar radicular symptoms.  We discussed lumbar epidural steroid injection.  Risks and benefits were discussed and patient would like to proceed.  Also provided the patient a  refill of her hydrocodone as below for 2 months.  Plan: -Lumbar epidural steroid injection without sedation -Hydrocodone refill as below for 2 months.  Plan of Care  Pharmacotherapy (Medications Ordered): Meds ordered this encounter  Medications  . DISCONTD: HYDROcodone-acetaminophen (NORCO) 7.5-325 MG tablet    Sig: Take 1 tablet by mouth 3 (three) times daily as needed for severe pain. For Chronic Pain DNF: 04/27/17, 05/27/17    Dispense:  90 tablet    Refill:  0  . HYDROcodone-acetaminophen (NORCO) 7.5-325 MG tablet    Sig: Take 1 tablet by mouth 3 (three) times daily as needed for severe pain. For Chronic Pain DNF: 04/27/17, 05/27/17    Dispense:  90 tablet    Refill:  0   Lab-work, procedure(s), and/or referral(s): Orders Placed This Encounter  Procedures  . Lumbar Epidural Injection    Provider-requested follow-up: Return in about 2 weeks (around 05/05/2017) for Procedure. Time Note: Greater than 50% of the 25 minute(s) of face-to-face time spent with Ms. Wilensky, was spent in counseling/coordination of care regarding: the appropriate use of the pain scale, opioid tolerance, Ms. Mcclory's primary cause of pain, the treatment plan, treatment alternatives, the risks and possible complications of proposed treatment, going over the informed consent, the opioid analgesic risks and possible complications, the results, interpretation and significance of  her recent diagnostic interventional treatment(s), the appropriate use of her medications, realistic expectations and the medication agreement. Future Appointments  Date Time Provider [place]  05/06/2017  8:45 AM Gillis Santa, MD [facility] None  06/16/2017  9:45 AM Gillis Santa, MD [facility] None    Primary Care Physician: Juline Patch, MD Location: [facility] Note by: Gillis Santa, M.D Date: 04/21/2017; Time: 10:50 AM  Patient Instructions   You have been given 2 Rx for Hydrocodone/APAP to last  until 06/26/2017  GENERAL RISKS AND COMPLICATIONS  What are the risk, side effects and possible complications? Generally speaking, most procedures are safe.  However, with any procedure there are risks, side effects, and the possibility of complications.  The risks and complications are dependent upon the sites that are lesioned, or the type of nerve block to be performed.  The closer the procedure is to the spine, the more serious the risks are.  Great care is taken when placing the radio frequency needles, block needles or lesioning probes, but sometimes complications can occur. 1. Infection: Any time there is an injection through the skin, there is a risk of infection.  This is why sterile conditions are used for these blocks.  There are four possible types of infection. 1. Localized skin infection. 2. Central Nervous System Infection-This can be in the form of Meningitis, which can be deadly. 3. Epidural Infections-This can be in the form of an epidural abscess, which can cause pressure inside of the spine, causing compression of the spinal cord with subsequent paralysis. This would require an emergency surgery to decompress, and there are no guarantees that the patient would recover from the paralysis. 4. Discitis-This is an infection of the intervertebral discs.  It occurs in about 1% of discography procedures.  It is difficult to treat and it may lead to surgery.        2. Pain: the needles have to go through skin and soft tissues, will cause soreness.       3. Damage to internal structures:  The nerves to be lesioned may be near blood vessels or    other nerves which can be potentially damaged.       4. Bleeding: Bleeding is more common if the patient is taking blood thinners such as  aspirin, Coumadin, Ticiid, Plavix, etc., or if he/she have some genetic predisposition  such as hemophilia. Bleeding into the spinal canal can cause compression of the spinal  cord with subsequent paralysis.   This would require an emergency surgery to  decompress and there are no guarantees that the patient would recover from the  paralysis.       5. Pneumothorax:  Puncturing of a lung is a possibility, every time a needle is introduced in  the area of the chest or upper back.  Pneumothorax refers to free air around the  collapsed lung(s), inside of the thoracic cavity (chest cavity).  Another two possible  complications related to a similar event would include: Hemothorax and Chylothorax.   These are variations of the Pneumothorax, where instead of air around the collapsed  lung(s), you may have blood or chyle, respectively.       6. Spinal headaches: They may occur with any procedures in the area of the spine.       7. Persistent CSF (Cerebro-Spinal Fluid) leakage: This is a rare problem, but may occur  with prolonged intrathecal or epidural catheters either due to the formation of a fistulous  track or a dural tear.       8. Nerve damage: By working so close to the spinal cord, there is always a possibility of  nerve damage, which could be as serious as a permanent spinal cord injury with  paralysis.       9. Death:  Although rare, severe deadly allergic reactions known as "Anaphylactic  reaction" can occur to any of the medications used.      10. Worsening of the symptoms:  We can always make thing worse.  What are the chances of something like this happening? Chances of any of this occuring are extremely low.  By statistics, you have more of a chance of getting killed in a motor vehicle accident: while driving to the hospital than any of the above occurring .  Nevertheless, you should be aware that they are possibilities.  In general, it is similar to taking a shower.  Everybody knows that you can slip, hit your head and get killed.  Does that mean that you should not shower again?  Nevertheless always keep in mind that statistics do not mean anything if you happen to be on the wrong side of them.  Even if  a procedure has a 1 (one) in a 1,000,000 (million) chance of going wrong, it you happen to be that one..Also, keep in mind that by statistics, you have more of a chance of having something go wrong when taking medications.  Who should not have this procedure? If you are on a blood thinning medication (e.g. Coumadin, Plavix, see list of "Blood Thinners"), or if you have an active infection going on, you should not have the procedure.  If you are taking any blood thinners, please inform your physician.  How should I prepare for this procedure?  Do not eat or drink anything at least six hours prior to the procedure.  Bring a driver with you .  It cannot be a taxi.  Come accompanied by an adult that can drive you back, and that is strong enough to help you if your legs get weak or numb from the local anesthetic.  Take all of your medicines the morning of the procedure with just enough water to swallow them.  If you have diabetes, make sure that you are scheduled to have your procedure done first thing in the morning, whenever possible.  If you have diabetes, take only half of your insulin dose and notify our nurse that you have done so as soon as you arrive at the clinic.  If you are diabetic, but only take blood sugar pills (oral hypoglycemic), then do not take them on the morning of your procedure.  You may take them after you have had the procedure.  Do not take aspirin or any aspirin-containing medications, at least eleven (11) days prior to the procedure.  They may prolong bleeding.  Wear loose fitting clothing that may be easy to take off and that you would not mind if it got stained with Betadine or blood.  Do not wear any jewelry or perfume  Remove any nail coloring.  It will interfere with some of our monitoring equipment.  NOTE: Remember that this is not meant to be interpreted as a complete list of all possible complications.  Unforeseen problems may occur.  BLOOD THINNERS The  following drugs contain aspirin or other products, which can cause increased bleeding during surgery and should not be taken for 2 weeks prior to and 1 week after surgery.  If you should need take something for relief of minor pain, you may take acetaminophen which is found in Tylenol,m Datril, Anacin-3 and Panadol. It is not blood thinner. The products listed below are.  Do not take any of the products listed below in addition to any listed on your instruction sheet.  A.P.C or A.P.C with Codeine Codeine Phosphate Capsules #3 Ibuprofen Ridaura  ABC compound Congesprin Imuran rimadil  Advil Cope Indocin Robaxisal  Alka-Seltzer Effervescent Pain Reliever and Antacid Coricidin or Coricidin-D  Indomethacin Rufen  Alka-Seltzer plus Cold Medicine Cosprin Ketoprofen S-A-C Tablets  Anacin Analgesic Tablets or Capsules Coumadin Korlgesic Salflex  Anacin Extra Strength Analgesic tablets or capsules CP-2 Tablets Lanoril Salicylate  Anaprox Cuprimine Capsules Levenox Salocol  Anexsia-D Dalteparin Magan Salsalate  Anodynos Darvon compound Magnesium Salicylate Sine-off  Ansaid Dasin Capsules Magsal Sodium Salicylate  Anturane Depen Capsules Marnal Soma  APF Arthritis pain formula Dewitt's Pills Measurin Stanback  Argesic Dia-Gesic Meclofenamic Sulfinpyrazone  Arthritis Bayer Timed Release Aspirin Diclofenac Meclomen Sulindac  Arthritis pain formula Anacin Dicumarol Medipren Supac  Analgesic (Safety coated) Arthralgen Diffunasal Mefanamic Suprofen  Arthritis Strength Bufferin Dihydrocodeine Mepro Compound Suprol  Arthropan liquid Dopirydamole Methcarbomol with Aspirin Synalgos  ASA tablets/Enseals Disalcid Micrainin Tagament  Ascriptin Doan's Midol Talwin  Ascriptin A/D Dolene Mobidin Tanderil  Ascriptin Extra Strength Dolobid Moblgesic Ticlid  Ascriptin with Codeine Doloprin or Doloprin with Codeine Momentum Tolectin  Asperbuf Duoprin Mono-gesic Trendar  Aspergum Duradyne Motrin or Motrin IB Triminicin   Aspirin plain, buffered or enteric coated Durasal Myochrisine Trigesic  Aspirin Suppositories Easprin Nalfon Trillsate  Aspirin with Codeine Ecotrin Regular or Extra Strength Naprosyn Uracel  Atromid-S Efficin Naproxen Ursinus  Auranofin Capsules Elmiron Neocylate Vanquish  Axotal Emagrin Norgesic Verin  Azathioprine Empirin or Empirin with Codeine Normiflo Vitamin E  Azolid Emprazil Nuprin Voltaren  Bayer Aspirin plain, buffered or children's or timed BC Tablets or powders Encaprin Orgaran Warfarin Sodium  Buff-a-Comp Enoxaparin Orudis Zorpin  Buff-a-Comp with Codeine Equegesic Os-Cal-Gesic   Buffaprin Excedrin plain, buffered or Extra Strength Oxalid   Bufferin Arthritis Strength Feldene Oxphenbutazone   Bufferin plain or Extra Strength Feldene Capsules Oxycodone with Aspirin   Bufferin with Codeine Fenoprofen Fenoprofen Pabalate or Pabalate-SF   Buffets II Flogesic Panagesic   Buffinol plain or Extra Strength Florinal or Florinal with Codeine Panwarfarin   Buf-Tabs Flurbiprofen Penicillamine   Butalbital Compound Four-way cold tablets Penicillin   Butazolidin Fragmin Pepto-Bismol   Carbenicillin Geminisyn Percodan   Carna Arthritis Reliever Geopen Persantine   Carprofen Gold's salt Persistin   Chloramphenicol Goody's Phenylbutazone   Chloromycetin Haltrain Piroxlcam   Clmetidine heparin Plaquenil   Cllnoril Hyco-pap Ponstel   Clofibrate Hydroxy chloroquine Propoxyphen         Before stopping any of these medications, be sure to consult the physician who ordered them.  Some, such as Coumadin (Warfarin) are ordered to prevent or treat serious conditions such as "deep thrombosis", "pumonary embolisms", and other heart problems.  The amount of time that you may need off of the medication may also vary with the medication and the reason for which you were taking it.  If you are taking any of these medications, please make sure you notify your pain physician before you undergo any  procedures.         Epidural Steroid Injection Patient Information  Description: The epidural space surrounds the nerves as they exit the spinal cord.  In some patients, the nerves can be compressed and inflamed by a bulging disc  or a tight spinal canal (spinal stenosis).  By injecting steroids into the epidural space, we can bring irritated nerves into direct contact with a potentially helpful medication.  These steroids act directly on the irritated nerves and can reduce swelling and inflammation which often leads to decreased pain.  Epidural steroids may be injected anywhere along the spine and from the neck to the low back depending upon the location of your pain.   After numbing the skin with local anesthetic (like Novocaine), a small needle is passed into the epidural space slowly.  You may experience a sensation of pressure while this is being done.  The entire block usually last less than 10 minutes.  Conditions which may be treated by epidural steroids:   Low back and leg pain  Neck and arm pain  Spinal stenosis  Post-laminectomy syndrome  Herpes zoster (shingles) pain  Pain from compression fractures  Preparation for the injection:  1. Do not eat any solid food or dairy products within 8 hours of your appointment.  2. You may drink clear liquids up to 3 hours before appointment.  Clear liquids include water, black coffee, juice or soda.  No milk or cream please. 3. You may take your regular medication, including pain medications, with a sip of water before your appointment  Diabetics should hold regular insulin (if taken separately) and take 1/2 normal NPH dos the morning of the procedure.  Carry some sugar containing items with you to your appointment. 4. A driver must accompany you and be prepared to drive you home after your procedure.  5. Bring all your current medications with your. 6. An IV may be inserted and sedation may be given at the discretion of the  physician.   7. A blood pressure cuff, EKG and other monitors will often be applied during the procedure.  Some patients may need to have extra oxygen administered for a short period. 8. You will be asked to provide medical information, including your allergies, prior to the procedure.  We must know immediately if you are taking blood thinners (like Coumadin/Warfarin)  Or if you are allergic to IV iodine contrast (dye). We must know if you could possible be pregnant.  Possible side-effects:  Bleeding from needle site  Infection (rare, may require surgery)  Nerve injury (rare)  Numbness & tingling (temporary)  Difficulty urinating (rare, temporary)  Spinal headache ( a headache worse with upright posture)  Light -headedness (temporary)  Pain at injection site (several days)  Decreased blood pressure (temporary)  Weakness in arm/leg (temporary)  Pressure sensation in back/neck (temporary)  Call if you experience:  Fever/chills associated with headache or increased back/neck pain.  Headache worsened by an upright position.  New onset weakness or numbness of an extremity below the injection site  Hives or difficulty breathing (go to the emergency room)  Inflammation or drainage at the infection site  Severe back/neck pain  Any new symptoms which are concerning to you  Please note:  Although the local anesthetic injected can often make your back or neck feel good for several hours after the injection, the pain will likely return.  It takes 3-7 days for steroids to work in the epidural space.  You may not notice any pain relief for at least that one week.  If effective, we will often do a series of three injections spaced 3-6 weeks apart to maximally decrease your pain.  After the initial series, we generally will wait several months before considering a  repeat injection of the same type.  If you have any questions, please call [phone] [facility]____________________________________________________________________________________________  Genicular Nerve Block  What is a genicular nerve block? A genicular nerve block is the injection of a local anesthetic to block the nerves that transmits pain from the knee.  What is the purpose of a facet nerve block? A genicular nerve block is a diagnostic procedure to determine if the pathologic changes (i.e. arthritis, meniscal tears, etc) and inflammation within the knee joint is the source of your knee pain. It also confirms that the knee pain will respond well to the actual treatment procedure. If a genicular nerve block works, it will give you relief for several hours. After that, the pain is expected to return to normal. This test is always performed twice (usually a week or two apart) because two successful tests are required to move onto treatment. If both diagnostic tests are positive, then we schedule a treatment called radiofrequency (RF) ablation. In this procedure, the same nerves are cauterized, which typically leads to pain relief for 4 -18 months. If this process works well for one knee, it can be performed on the other knee if needed.  How is the procedure performed? You will be placed on the procedure table. The injection site is sterilized with either iodine or chlorhexadine. The site to be injected is numbed with a local anesthetic, and a needle is directed to the target area. X-ray guidance is used to ensure proper placement and positioning of the needle. When the needle is properly positioned near the genicular nerve, local anesthetic is injected to numb that nerve. This will be repeated at multiple sites around the knee to block all genicular nerves.  Will the procedure be painful? The injection can be painful and we therefore provide the option of receiving IV sedation. IV sedation, combined with local anesthetic, can make the injection nearly pain free. It allows  you to remain very still during the procedure, which can also make the injection easier, faster, and more successful. If you decide to have IV sedation, you must have a driver to get you home safely afterwards. In addition, you cannot have anything to eat or drink within 8 hours of your appointment (clear liquids are allowed until 3 hours before the procedure). If you take medications for diabetes, these medications may need to be adjusted the morning of the procedure. Your primary care physician can help you with this adjustment.  What are the discharge instructions? If you received IV sedation do not drive or operate machinery for at least 24 hours after the procedure. You may return to work the next day following your procedure. You may resume your normal diet immediately. Do not engage in any strenuous activity for 24 hours. You should, however, engage in moderate activity that typically causes your ususal pain. If the block works, those activities should not be painful for several hours after the injection. Do not take a bath, swim, or use a hot tub for 24 hours (you may take a shower). Call the office if you have any of the following: severe pain afterwards (different than your usual symptoms), redness/swelling/discharge at the injection site(s), fevers/chills, difficulty with bowel or bladder functions.  What are the risks and side effects? The complication rate for this procedure is very low. Whenever a needle enters the skin, bleeding or infection can occur. Some other serious but extremely rare risks include paralysis and death. You may have  an allergic reaction to any of the medications used. If you have a known allergy to any medications, especially local anesthetics, notify our staff before the procedure takes place. You may experience any of the following side effects up to 4 - 6 hours after the procedure: . Leg muscle weakness or numbness may occur due to the local anesthetic affecting the  nerves that control your legs (this is a temporary affect and it is not paralysis). If you have any leg weakness or numbness, walk only with assistance in order to prevent falls and injury. Your leg strength will return slowly and completely. . Dizziness may occur due to a decrease in your blood pressure. If this occurs, remain in a seated or lying position. Gradually sit up, and then stand after at least 10 minutes of sitting. . Mild headaches may occur. Drink fluids and take pain medications if needed. If the headaches persist or become severe, call the office. . Mild discomfort at the injection site can occur. This typically lasts for a few hours but can persist for a couple days. If this occurs, take anti-inflammatories or pain medications, apply ice to the area the day of the procedure. If it persists, apply moist heat in the day(s) following.  The side effects listed above can be normal. They are not dangerous and will resolve on their own. If, however, you experience any of the following, a complication may have occurred and you should either contact your doctor. If he is not readily available, then you should proceed to the closest urgent care center for evaluation: . Severe or progressive pain at the injection site(s) . Arm or leg weakness that progressively worsens or persists for longer than 8 hours . Severe or progressive redness, swelling, or discharge from the injections site(s) . Fevers, chills, nausea, or vomiting . Bowel or bladder dysfunction (i.e. inability to urinate or pass stool or difficulty controlling either)  How long does it take for the procedure to work? You should feel relief from your usual pain within the first hour. Again, this is only expected to last for several hours, at the most. Remember, you may be sore in the middle part of your back from the needles, and you must distinguish this from your usual  pain. ____________________________________________________________________________________________

## 2017-04-21 NOTE — Progress Notes (Signed)
Nursing Pain Medication Assessment:  Safety precautions to be maintained throughout the outpatient stay will include: orient to surroundings, keep bed in low position, maintain call bell within reach at all times, provide assistance with transfer out of bed and ambulation.  Medication Inspection Compliance: Pill count conducted under aseptic conditions, in front of the patient. Neither the pills nor the bottle was removed from the patient's sight at any time. Once count was completed pills were immediately returned to the patient in their original bottle.  Medication: Hydrocodone/APAP Pill/Patch Count: 20 of 90 pills remain Pill/Patch Appearance: Markings consistent with prescribed medication Bottle Appearance: Standard pharmacy container. Clearly labeled. Filled Date: 2 / 3 / 2019 Last Medication intake:  Today

## 2017-04-23 ENCOUNTER — Ambulatory Visit: Payer: Medicare Other | Admitting: Student in an Organized Health Care Education/Training Program

## 2017-04-28 ENCOUNTER — Ambulatory Visit: Payer: Medicare Other | Admitting: Student in an Organized Health Care Education/Training Program

## 2017-05-05 DIAGNOSIS — J019 Acute sinusitis, unspecified: Secondary | ICD-10-CM | POA: Diagnosis not present

## 2017-05-05 DIAGNOSIS — R05 Cough: Secondary | ICD-10-CM | POA: Diagnosis not present

## 2017-05-06 ENCOUNTER — Ambulatory Visit: Payer: Medicare Other | Admitting: Student in an Organized Health Care Education/Training Program

## 2017-05-13 ENCOUNTER — Other Ambulatory Visit: Payer: Self-pay

## 2017-05-13 ENCOUNTER — Ambulatory Visit
Admission: EM | Admit: 2017-05-13 | Discharge: 2017-05-13 | Disposition: A | Discharge status: Home / Self Care | Payer: Medicare Other | Attending: Family Medicine | Admitting: Family Medicine

## 2017-05-13 ENCOUNTER — Encounter: Payer: Self-pay | Admitting: Emergency Medicine

## 2017-05-13 ENCOUNTER — Ambulatory Visit: Payer: Medicare Other | Admitting: Student in an Organized Health Care Education/Training Program

## 2017-05-13 ENCOUNTER — Ambulatory Visit
Admit: 2017-05-13 | Discharge: 2017-05-13 | Disposition: A | Discharge status: Home / Self Care | Payer: Medicare Other | Attending: Family Medicine | Admitting: Family Medicine

## 2017-05-13 ENCOUNTER — Ambulatory Visit: Payer: Medicare Other

## 2017-05-13 DIAGNOSIS — Z880 Allergy status to penicillin: Secondary | ICD-10-CM | POA: Insufficient documentation

## 2017-05-13 DIAGNOSIS — M549 Dorsalgia, unspecified: Secondary | ICD-10-CM | POA: Insufficient documentation

## 2017-05-13 DIAGNOSIS — E782 Mixed hyperlipidemia: Secondary | ICD-10-CM | POA: Insufficient documentation

## 2017-05-13 DIAGNOSIS — W010XXA Fall on same level from slipping, tripping and stumbling without subsequent striking against object, initial encounter: Secondary | ICD-10-CM | POA: Diagnosis not present

## 2017-05-13 DIAGNOSIS — E079 Disorder of thyroid, unspecified: Secondary | ICD-10-CM | POA: Insufficient documentation

## 2017-05-13 DIAGNOSIS — N189 Chronic kidney disease, unspecified: Secondary | ICD-10-CM | POA: Insufficient documentation

## 2017-05-13 DIAGNOSIS — Z87891 Personal history of nicotine dependence: Secondary | ICD-10-CM | POA: Insufficient documentation

## 2017-05-13 DIAGNOSIS — R51 Headache: Secondary | ICD-10-CM | POA: Diagnosis not present

## 2017-05-13 DIAGNOSIS — S199XXA Unspecified injury of neck, initial encounter: Secondary | ICD-10-CM | POA: Diagnosis not present

## 2017-05-13 DIAGNOSIS — S99911A Unspecified injury of right ankle, initial encounter: Secondary | ICD-10-CM | POA: Diagnosis not present

## 2017-05-13 DIAGNOSIS — M542 Cervicalgia: Secondary | ICD-10-CM | POA: Diagnosis not present

## 2017-05-13 DIAGNOSIS — Z7982 Long term (current) use of aspirin: Secondary | ICD-10-CM | POA: Insufficient documentation

## 2017-05-13 DIAGNOSIS — S93401A Sprain of unspecified ligament of right ankle, initial encounter: Secondary | ICD-10-CM

## 2017-05-13 DIAGNOSIS — K219 Gastro-esophageal reflux disease without esophagitis: Secondary | ICD-10-CM | POA: Diagnosis not present

## 2017-05-13 DIAGNOSIS — S0990XA Unspecified injury of head, initial encounter: Secondary | ICD-10-CM

## 2017-05-13 DIAGNOSIS — M47812 Spondylosis without myelopathy or radiculopathy, cervical region: Secondary | ICD-10-CM | POA: Insufficient documentation

## 2017-05-13 DIAGNOSIS — W19XXXA Unspecified fall, initial encounter: Secondary | ICD-10-CM

## 2017-05-13 DIAGNOSIS — Z79899 Other long term (current) drug therapy: Secondary | ICD-10-CM | POA: Diagnosis not present

## 2017-05-13 DIAGNOSIS — I129 Hypertensive chronic kidney disease with stage 1 through stage 4 chronic kidney disease, or unspecified chronic kidney disease: Secondary | ICD-10-CM | POA: Insufficient documentation

## 2017-05-13 DIAGNOSIS — Z79890 Hormone replacement therapy (postmenopausal): Secondary | ICD-10-CM | POA: Diagnosis not present

## 2017-05-13 DIAGNOSIS — Y92481 Parking lot as the place of occurrence of the external cause: Secondary | ICD-10-CM | POA: Diagnosis not present

## 2017-05-13 DIAGNOSIS — S0003XA Contusion of scalp, initial encounter: Secondary | ICD-10-CM | POA: Diagnosis not present

## 2017-05-13 MED ORDER — ACETAMINOPHEN 500 MG PO TABS
1000.0000 mg | ORAL_TABLET | Freq: Once | ORAL | Status: AC
  Administered 2017-05-13: 1000 mg via ORAL

## 2017-05-13 NOTE — ED Triage Notes (Signed)
Patient states that she feel about an hour ago in the parking lot of a store today.  Patient states that she hit the back of head and back on the concrete.  Patient c/o HAs and shoulder pain.

## 2017-05-13 NOTE — Discharge Instructions (Signed)
Rest, ice, continue home pain medications

## 2017-05-13 NOTE — ED Provider Notes (Signed)
[facility]    CSN: [id number] Arrival date & time: 05/13/17  1256     History   Chief Complaint Chief Complaint  Patient presents with  . Fall  . Head Injury  . Back Pain    HPI Michele Moore is a 67 y.o. female.   67 yo female with a c/o neck pain, severe headache and right ankle pain after falling about 2 hours ago. States she was about to get in her truck in a parking lot when she stepped in a hole causing her to twist her right ankle and fall backwards hitting the back of her head and her neck when she hit the pavement. Denies loss of consciousness however states now has a severe headache.    The history is provided by the patient.    Past Medical History:  Diagnosis Date  . Anemia   . Arthritis   . Chronic kidney disease    STAGE 3 PER DR EASON 08/02/15  . Family history of adverse reaction to anesthesia    sister difficult to put to sleep  . GERD (gastroesophageal reflux disease)   . H/O tooth extraction    all lower teeth 1/19  . Hemorrhoid   . History of hiatal hernia   . Hypertension   . Migraines   . Mixed hyperlipidemia   . Multiple gastric ulcers   . Thyroid disease     Patient Active Problem List   Diagnosis Date Noted  . Lumbar radiculopathy 04/21/2017  . Lumbar degenerative disc disease 04/21/2017  . Chronic pain syndrome 04/21/2017  . Spinal stenosis, lumbar region, with neurogenic claudication 04/21/2017  . Pain, lower extremity 08/03/2015    Past Surgical History:  Procedure Laterality Date  . ANKLE SURGERY    . CARPAL TUNNEL RELEASE     x3  . COLONOSCOPY  2000?  . FUSION OF TALONAVICULAR JOINT Right 08/03/2015   Procedure: TAILOR NAVICULAR JOINT FUSION - RIGHT ;  Surgeon: [name], DPM;  Location: [facility] ORS;  Service: Podiatry;  Laterality: Right;  . JOINT REPLACEMENT     knee x 3 ,right x1 and left x2  . ROTATOR CUFF REPAIR    . TONSILLECTOMY    . TUBAL LIGATION    . UPPER GI ENDOSCOPY  2000?    OB History    Gravida Para Term Preterm AB Living   4             SAB TAB Ectopic Multiple Live Births                  Obstetric Comments   1st Menstrual Cycle:  16 1st Pregnancy:  19       Home Medications    Prior to Admission medications   Medication Sig Start Date End Date Taking? Authorizing Provider  aspirin 81 MG tablet Take 81 mg by mouth daily.   Yes [provider]  Cholecalciferol (VITAMIN D3) 2000 units capsule Take 2,000 Units by mouth daily.   Yes [provider]  clindamycin (CLEOCIN) 300 MG capsule Take 1 capsule 4 (four) times daily by mouth. 12/31/16  Yes [provider]  ferrous sulfate 325 (65 FE) MG tablet Take 325 mg by mouth daily with breakfast. otc   Yes [provider]  fexofenadine-pseudoephedrine (ALLEGRA-D 24) 180-240 MG 24 hr tablet Take by mouth. otc 05/01/15  Yes [provider]  gabapentin (NEURONTIN) 300 MG capsule TAKE 2 CAPSULES BY MOUTH NIGHTLY/ Lateef 01/22/17  Yes Lateef,  Bilal, MD  hydrALAZINE (APRESOLINE) 50 MG tablet Take 1 tablet (50 mg total) by mouth 2 (two) times daily. 12/23/16  Yes Juline Patch, MD  HYDROcodone-acetaminophen (NORCO) 7.5-325 MG tablet Take 1 tablet by mouth 3 (three) times daily as needed for severe pain. For Chronic Pain DNF: 04/27/17, 05/27/17 04/21/17  Yes Gillis Santa, MD  ibuprofen (ADVIL,MOTRIN) 200 MG tablet Take 400 mg by mouth as needed. otc   Yes [provider]  ketoconazole (NIZORAL) 2 % cream U UTD QHS 11/22/14  Yes [provider]  levothyroxine (SYNTHROID) 200 MCG tablet Take 1 tablet (200 mcg total) by mouth daily before breakfast. 12/23/16  Yes Juline Patch, MD  methylPREDNISolone (MEDROL DOSEPAK) 4 MG TBPK tablet TK PO UTD 12/31/16  Yes [provider]  Multiple Vitamins-Calcium (ONE-A-DAY WOMENS FORMULA PO) Take 1 tablet by mouth daily.   Yes [provider]  mupirocin cream (BACTROBAN) 2 % Apply 1 application topically 2 (two) times daily.  07/05/16  Yes Letitia Neri L, PA-C  omega-3 acid ethyl esters (LOVAZA) 1 g capsule Take 2 capsules (2 g total) by mouth 2 (two) times daily. 12/23/16  Yes Juline Patch, MD  omeprazole (PRILOSEC) 40 MG capsule Take 1 capsule (40 mg total) by mouth daily. 12/23/16  Yes Juline Patch, MD  Polyethyl Glycol-Propyl Glycol (SYSTANE OP) Apply to eye.   Yes [provider]  polyethylene glycol (MIRALAX / GLYCOLAX) packet Take 17 g by mouth daily. 08/07/15  Yes [name], DPM  potassium chloride SA (K-DUR,KLOR-CON) 20 MEQ tablet Take 1 tablet (20 mEq total) by mouth daily. 12/23/16  Yes Juline Patch, MD  ranitidine (ZANTAC) 150 MG tablet Take 1 tablet (150 mg total) by mouth 2 (two) times daily. 12/23/16  Yes Juline Patch, MD  tiZANidine (ZANAFLEX) 4 MG capsule Take 1 capsule (4 mg total) by mouth 2 (two) times daily as needed. 01/22/17  Yes Gillis Santa, MD  triamcinolone cream (KENALOG) 0.5 % Apply topically. 08/18/16  Yes [provider]  triamterene-hydrochlorothiazide (MAXZIDE) 75-50 MG tablet Take 1 tablet by mouth daily. 12/23/16  Yes Juline Patch, MD  trimethoprim-polymyxin b Mayra Neer) ophthalmic solution  10/13/14  Yes [provider]    Family History Family History  Problem Relation Age of Onset  . COPD Sister   . Cancer Maternal Aunt        breast  . Cancer Maternal Uncle        kidney    Social History Social History   Tobacco Use  . Smoking status: Former Smoker    Last attempt to quit: 02/24/1993    Years since quitting: 24.2  . Smokeless tobacco: Never Used  Substance Use Topics  . Alcohol use: No    Alcohol/week: 0.0 oz  . Drug use: No     Allergies   Ampicillin; Penicillins; and Vibramycin [doxycycline calcium]   Review of Systems Review of Systems   Physical Exam Triage Vital Signs ED Triage Vitals  Enc Vitals Group     BP 05/13/17 1348 128/68     Pulse Rate 05/13/17 1348 64     Resp 05/13/17 1348 16     Temp  05/13/17 1348 98.3 F (36.8 C)     Temp Source 05/13/17 1348 Oral     SpO2 05/13/17 1348 99 %     Weight 05/13/17 1345 265 lb (120.2 kg)     Height 05/13/17 1345 5\' 11"  (1.803 m)     Head  Circumference --      Peak Flow --      Pain Score 05/13/17 1345 10     Pain Loc --      Pain Edu? --      Excl. in [place]? --    No data found.  Updated Vital Signs BP 128/68 (BP Location: Left Arm)   Pulse 64   Temp 98.3 F (36.8 C) (Oral)   Resp 16   Ht 5\' 11"  (1.803 m)   Wt 265 lb (120.2 kg)   SpO2 99%   BMI 36.96 kg/m   Visual Acuity Right Eye Distance:   Left Eye Distance:   Bilateral Distance:    Right Eye Near:   Left Eye Near:    Bilateral Near:     Physical Exam  Constitutional: She is oriented to person, place, and time. She appears well-developed and well-nourished. No distress.  HENT:  Head: Normocephalic.  Right Ear: Tympanic membrane, external ear and ear canal normal.  Left Ear: Tympanic membrane, external ear and ear canal normal.  Nose: Nose normal.  Mouth/Throat: Oropharynx is clear and moist and mucous membranes are normal.  Eyes: Conjunctivae and EOM are normal. Pupils are equal, round, and reactive to light. Right eye exhibits no discharge. Left eye exhibits no discharge. No scleral icterus.  Neck: Normal range of motion. Neck supple. No JVD present. No tracheal deviation present. No thyromegaly present.  Cardiovascular: Normal rate, regular rhythm, normal heart sounds and intact distal pulses.  No murmur heard. Pulmonary/Chest: Effort normal and breath sounds normal. No stridor. No respiratory distress. She has no wheezes. She has no rales. She exhibits no tenderness.  Abdominal: Soft. Bowel sounds are normal. She exhibits no distension and no mass. There is no tenderness. There is no rebound and no guarding.  Musculoskeletal: She exhibits tenderness. She exhibits no edema.       Right ankle: She exhibits swelling and ecchymosis. She exhibits no deformity, no  laceration and normal pulse. Tenderness. Lateral malleolus and medial malleolus tenderness found. Achilles tendon normal.       Cervical back: She exhibits decreased range of motion, tenderness and bony tenderness. She exhibits no swelling, no edema, no deformity, no laceration, no pain, no spasm and normal pulse.  Lymphadenopathy:    She has no cervical adenopathy.  Neurological: She is alert and oriented to person, place, and time. She has normal reflexes. She displays normal reflexes. No cranial nerve deficit or sensory deficit. She exhibits normal muscle tone. Coordination normal.  Skin: Skin is warm and dry. No rash noted. She is not diaphoretic. No erythema. No pallor.  Psychiatric: Her behavior is normal. Thought content normal.  Vitals reviewed.    UC Treatments / Results  Labs (all labs ordered are listed, but only abnormal results are displayed) Labs Reviewed - No data to display  EKG  EKG Interpretation None       Radiology Dg Ankle Complete Right  Result Date: 05/13/2017 CLINICAL DATA:  Fall twisted the right ankle EXAM: RIGHT ANKLE - COMPLETE 3+ VIEW COMPARISON:  11/16/2015 FINDINGS: Talocalcaneal fixating screw with effacement of subtalar joints. Surgical hardware within the tarsal bones with fracture through an anterior fixating screw. Interim placement of fixating screw that parallels the medial malleolus. Asymmetric narrowing of the superior medial mortise and medial joint space unchanged compared to prior. No definite acute displaced fracture. IMPRESSION: 1. No definite acute osseous abnormality. 2. Surgical changes of the tarsal bones with subtalar fusion. Fracture through anterior fixating  screw on the lateral view, new since prior. Placement of new fixating screw within the tarsal bones that protrudes and parallels the medial malleolus. Electronically Signed   By: Donavan Foil M.D.   On: 05/13/2017 15:47   Ct Head Wo Contrast  Result Date: 05/13/2017 CLINICAL DATA:   Fall in parking lot 1 hour ago. Trauma to back of head. Headache and shoulder pain. EXAM: CT HEAD WITHOUT CONTRAST CT CERVICAL SPINE WITHOUT CONTRAST TECHNIQUE: Multidetector CT imaging of the head and cervical spine was performed following the standard protocol without intravenous contrast. Multiplanar CT image reconstructions of the cervical spine were also generated. COMPARISON:  CT head without contrast 12/12/2006. MRI of the cervical spine 06/24/2009 FINDINGS: CT HEAD FINDINGS Brain: No acute infarct, hemorrhage, or mass lesion is present. The ventricles are of normal size. No significant extraaxial fluid collection is present. No significant extra-axial fluid collection is present. Vascular: No hyperdense vessel or unexpected calcification. Skull: Calvarium is intact. No focal lytic or blastic lesions are present. There is no acute fracture. No significant scalp injury is evident. Sinuses/Orbits: The paranasal sinuses and mastoid air cells are clear. Globes and orbits are within normal limits. CT CERVICAL SPINE FINDINGS Alignment: Grade 1 anterolisthesis is present at C4-5 and at C7-T1 secondary to degenerative changes. Skull base and vertebrae: The craniocervical junction is normal. No acute or healing fractures are present. Soft tissues and spinal canal: The soft tissues the neck are otherwise unremarkable. No significant adenopathy is present. Thyroid is normal. Salivary glands are within normal limits. Disc levels: Asymmetric left-sided facet hypertrophy contributes to foraminal narrowing at C2-3 and C3-4. Asymmetric uncovertebral spurring contributes to right foraminal narrowing at C5-6 and C6-7. Upper chest: The lung apices are clear. IMPRESSION: 1. No acute trauma to the head or cervical spine. 2. Normal CT appearance of the brain for age. 3. Degenerative changes of the cervical spine as described. Electronically Signed   By: San Morelle M.D.   On: 05/13/2017 15:52   Ct Cervical Spine Wo  Contrast  Result Date: 05/13/2017 CLINICAL DATA:  Fall in parking lot 1 hour ago. Trauma to back of head. Headache and shoulder pain. EXAM: CT HEAD WITHOUT CONTRAST CT CERVICAL SPINE WITHOUT CONTRAST TECHNIQUE: Multidetector CT imaging of the head and cervical spine was performed following the standard protocol without intravenous contrast. Multiplanar CT image reconstructions of the cervical spine were also generated. COMPARISON:  CT head without contrast 12/12/2006. MRI of the cervical spine 06/24/2009 FINDINGS: CT HEAD FINDINGS Brain: No acute infarct, hemorrhage, or mass lesion is present. The ventricles are of normal size. No significant extraaxial fluid collection is present. No significant extra-axial fluid collection is present. Vascular: No hyperdense vessel or unexpected calcification. Skull: Calvarium is intact. No focal lytic or blastic lesions are present. There is no acute fracture. No significant scalp injury is evident. Sinuses/Orbits: The paranasal sinuses and mastoid air cells are clear. Globes and orbits are within normal limits. CT CERVICAL SPINE FINDINGS Alignment: Grade 1 anterolisthesis is present at C4-5 and at C7-T1 secondary to degenerative changes. Skull base and vertebrae: The craniocervical junction is normal. No acute or healing fractures are present. Soft tissues and spinal canal: The soft tissues the neck are otherwise unremarkable. No significant adenopathy is present. Thyroid is normal. Salivary glands are within normal limits. Disc levels: Asymmetric left-sided facet hypertrophy contributes to foraminal narrowing at C2-3 and C3-4. Asymmetric uncovertebral spurring contributes to right foraminal narrowing at C5-6 and C6-7. Upper chest: The lung apices are clear.  IMPRESSION: 1. No acute trauma to the head or cervical spine. 2. Normal CT appearance of the brain for age. 3. Degenerative changes of the cervical spine as described. Electronically Signed   By: San Morelle M.D.    On: 05/13/2017 15:52    Procedures Procedures (including critical care time)  Medications Ordered in UC Medications  acetaminophen (TYLENOL) tablet 1,000 mg (1,000 mg Oral Given 05/13/17 1502)     Initial Impression / Assessment and Plan / UC Course  I have reviewed the triage vital signs and the nursing notes.  Pertinent labs & imaging results that were available during my care of the patient were reviewed by me and considered in my medical decision making (see chart for details).       Final Clinical Impressions(s) / UC Diagnoses   Final diagnoses:  Injury of head, initial encounter  Contusion of scalp, initial encounter  Neck pain  Moderate ankle sprain, right, initial encounter  Fall, initial encounter    ED Discharge Orders    None     1. x-ray results and diagnosis reviewed with patient 2. rx as per orders above; reviewed possible side effects, interactions, risks and benefits  3. Recommend supportive treatment with rest, ice, otc analgesics prn 4. Follow up with her podiatrist (Dr. Vickki Muff) due to possible new changes in hardware of right ankle; use boot patient has at home 5. Follow-up prn if symptoms worsen or don't improve  Controlled Substance Prescriptions [place] Controlled Substance Registry consulted? Not Applicable   Norval Gable, MD 05/13/17 [phone]

## 2017-05-14 ENCOUNTER — Other Ambulatory Visit: Payer: Medicare Other

## 2017-05-14 DIAGNOSIS — I10 Essential (primary) hypertension: Secondary | ICD-10-CM | POA: Diagnosis not present

## 2017-05-14 DIAGNOSIS — N2581 Secondary hyperparathyroidism of renal origin: Secondary | ICD-10-CM | POA: Diagnosis not present

## 2017-05-14 DIAGNOSIS — D631 Anemia in chronic kidney disease: Secondary | ICD-10-CM | POA: Diagnosis not present

## 2017-05-14 DIAGNOSIS — N183 Chronic kidney disease, stage 3 (moderate): Secondary | ICD-10-CM | POA: Diagnosis not present

## 2017-05-25 DIAGNOSIS — M9689 Other intraoperative and postprocedural complications and disorders of the musculoskeletal system: Secondary | ICD-10-CM | POA: Diagnosis not present

## 2017-05-25 DIAGNOSIS — S93491A Sprain of other ligament of right ankle, initial encounter: Secondary | ICD-10-CM | POA: Diagnosis not present

## 2017-06-09 ENCOUNTER — Other Ambulatory Visit: Payer: Self-pay

## 2017-06-09 ENCOUNTER — Encounter: Payer: Self-pay | Admitting: Emergency Medicine

## 2017-06-09 ENCOUNTER — Emergency Department
Admission: EM | Admit: 2017-06-09 | Discharge: 2017-06-09 | Disposition: A | Discharge status: Home / Self Care | Payer: Medicare Other | Attending: Student in an Organized Health Care Education/Training Program | Admitting: Student in an Organized Health Care Education/Training Program

## 2017-06-09 ENCOUNTER — Emergency Department: Payer: Medicare Other

## 2017-06-09 DIAGNOSIS — J069 Acute upper respiratory infection, unspecified: Secondary | ICD-10-CM | POA: Insufficient documentation

## 2017-06-09 DIAGNOSIS — R05 Cough: Secondary | ICD-10-CM | POA: Diagnosis not present

## 2017-06-09 DIAGNOSIS — Z96653 Presence of artificial knee joint, bilateral: Secondary | ICD-10-CM | POA: Diagnosis not present

## 2017-06-09 DIAGNOSIS — I129 Hypertensive chronic kidney disease with stage 1 through stage 4 chronic kidney disease, or unspecified chronic kidney disease: Secondary | ICD-10-CM | POA: Insufficient documentation

## 2017-06-09 DIAGNOSIS — N183 Chronic kidney disease, stage 3 (moderate): Secondary | ICD-10-CM | POA: Diagnosis not present

## 2017-06-09 DIAGNOSIS — Z87891 Personal history of nicotine dependence: Secondary | ICD-10-CM | POA: Diagnosis not present

## 2017-06-09 DIAGNOSIS — Z7982 Long term (current) use of aspirin: Secondary | ICD-10-CM | POA: Diagnosis not present

## 2017-06-09 DIAGNOSIS — Z79899 Other long term (current) drug therapy: Secondary | ICD-10-CM | POA: Insufficient documentation

## 2017-06-09 MED ORDER — BENZONATATE 200 MG PO CAPS: 200.0000 mg | ORAL_CAPSULE | Freq: Three times a day (TID) | ORAL | 0 refills | Status: DC | PRN

## 2017-06-09 MED ORDER — IPRATROPIUM-ALBUTEROL 0.5-2.5 (3) MG/3ML IN SOLN
3.0000 mL | Freq: Once | RESPIRATORY_TRACT | Status: AC
  Administered 2017-06-09: 3 mL via RESPIRATORY_TRACT
  Filled 2017-06-09: qty 3

## 2017-06-09 MED ORDER — FLUTICASONE PROPIONATE 50 MCG/ACT NA SUSP: 1.0000 | Freq: Every day | NASAL | 2 refills | Status: DC

## 2017-06-09 MED ORDER — AZITHROMYCIN 250 MG PO TABS: ORAL_TABLET | ORAL | 0 refills | Status: DC

## 2017-06-09 NOTE — ED Notes (Addendum)
See triage note  Presents with general body aches for about 2 weeks   Then developed subjective fever and cough 2 days ago   States she also has had some wheezing

## 2017-06-09 NOTE — ED Triage Notes (Signed)
Says cough, nasal congestion, ears popping since last Monday.

## 2017-06-09 NOTE — Discharge Instructions (Addendum)
Follow-up with your regular doctor if not better in 3 days.  Use medication as prescribed.  Return emergency department if you are worsening.

## 2017-06-09 NOTE — ED Provider Notes (Signed)
[facility] Emergency Department Provider Note  ____________________________________________   First MD Initiated Contact with Patient 06/09/17 1247     (approximate)  I have reviewed the triage vital signs and the nursing notes.   HISTORY  Chief Complaint Cough; Nasal Congestion; and Ear Fullness    HPI Michele Moore is a 67 y.o. female presents emergency department complaining of body aches and cough for about 2 weeks.  She thinks she has had a low-grade fever while at home.  She states she started wheezing.  She states she cannot lay down and catch her breath because of the wheezing.  She states been coughing up all sorts of phlegm.  She denies any chest pain.  She denies swelling in the extremities other than from her injury to her foot.  She was seen 2 weeks ago by Dr. Zollie Scale who stated her kidney functions had improved.  Past Medical History:  Diagnosis Date  . Anemia   . Arthritis   . Chronic kidney disease    STAGE 3 PER DR EASON 08/02/15  . Family history of adverse reaction to anesthesia    sister difficult to put to sleep  . GERD (gastroesophageal reflux disease)   . H/O tooth extraction    all lower teeth 1/19  . Hemorrhoid   . History of hiatal hernia   . Hypertension   . Migraines   . Mixed hyperlipidemia   . Multiple gastric ulcers   . Thyroid disease     Patient Active Problem List   Diagnosis Date Noted  . Lumbar radiculopathy 04/21/2017  . Lumbar degenerative disc disease 04/21/2017  . Chronic pain syndrome 04/21/2017  . Spinal stenosis, lumbar region, with neurogenic claudication 04/21/2017  . Pain, lower extremity 08/03/2015    Past Surgical History:  Procedure Laterality Date  . ANKLE SURGERY    . CARPAL TUNNEL RELEASE     x3  . COLONOSCOPY  2000?  . FUSION OF TALONAVICULAR JOINT Right 08/03/2015   Procedure: TAILOR NAVICULAR JOINT FUSION - RIGHT ;  Surgeon: [name], DPM;  Location: [facility] ORS;  Service: Podiatry;   Laterality: Right;  . JOINT REPLACEMENT     knee x 3 ,right x1 and left x2  . ROTATOR CUFF REPAIR    . TONSILLECTOMY    . TUBAL LIGATION    . UPPER GI ENDOSCOPY  2000?    Prior to Admission medications   Medication Sig Start Date End Date Taking? Authorizing Provider  aspirin 81 MG tablet Take 81 mg by mouth daily.    [provider]  azithromycin (ZITHROMAX Z-PAK) 250 MG tablet 2 pills today then 1 pill a day for 4 days 06/09/17   Caryn Section Linden Dolin, PA-C  benzonatate (TESSALON) 200 MG capsule Take 1 capsule (200 mg total) by mouth 3 (three) times daily as needed for cough. 06/09/17   Elisabet Gutzmer, Linden Dolin, PA-C  Cholecalciferol (VITAMIN D3) 2000 units capsule Take 2,000 Units by mouth daily.    [provider]  ferrous sulfate 325 (65 FE) MG tablet Take 325 mg by mouth daily with breakfast. otc    [provider]  fluticasone (FLONASE) 50 MCG/ACT nasal spray Place 1 spray into both nostrils daily. 06/09/17 06/09/18  Khadijatou Borak, Linden Dolin, PA-C  gabapentin (NEURONTIN) 300 MG capsule TAKE 2 CAPSULES BY MOUTH NIGHTLY/ [place] 01/22/17   Gillis Santa, MD  hydrALAZINE (APRESOLINE) 50 MG tablet Take 1 tablet (50 mg total) by mouth 2 (two) times daily. 12/23/16  Juline Patch, MD  HYDROcodone-acetaminophen (NORCO) 7.5-325 MG tablet Take 1 tablet by mouth 3 (three) times daily as needed for severe pain. For Chronic Pain DNF: 04/27/17, 05/27/17 04/21/17   Gillis Santa, MD  ibuprofen (ADVIL,MOTRIN) 200 MG tablet Take 400 mg by mouth as needed. otc    [provider]  ketoconazole (NIZORAL) 2 % cream U UTD QHS 11/22/14   [provider]  levothyroxine (SYNTHROID) 200 MCG tablet Take 1 tablet (200 mcg total) by mouth daily before breakfast. 12/23/16   Juline Patch, MD  Multiple Vitamins-Calcium (ONE-A-DAY WOMENS FORMULA PO) Take 1 tablet by mouth daily.    [provider]  mupirocin cream (BACTROBAN) 2 % Apply 1 application topically 2 (two) times daily. 07/05/16    Johnn Hai, PA-C  omega-3 acid ethyl esters (LOVAZA) 1 g capsule Take 2 capsules (2 g total) by mouth 2 (two) times daily. 12/23/16   Juline Patch, MD  omeprazole (PRILOSEC) 40 MG capsule Take 1 capsule (40 mg total) by mouth daily. 12/23/16   Juline Patch, MD  Polyethyl Glycol-Propyl Glycol (SYSTANE OP) Apply to eye.    [provider]  polyethylene glycol (MIRALAX / GLYCOLAX) packet Take 17 g by mouth daily. 08/07/15   [name], DPM  potassium chloride SA (K-DUR,KLOR-CON) 20 MEQ tablet Take 1 tablet (20 mEq total) by mouth daily. 12/23/16   Juline Patch, MD  ranitidine (ZANTAC) 150 MG tablet Take 1 tablet (150 mg total) by mouth 2 (two) times daily. 12/23/16   Juline Patch, MD  tiZANidine (ZANAFLEX) 4 MG capsule Take 1 capsule (4 mg total) by mouth 2 (two) times daily as needed. 01/22/17   Gillis Santa, MD  triamcinolone cream (KENALOG) 0.5 % Apply topically. 08/18/16   [provider]  triamterene-hydrochlorothiazide (MAXZIDE) 75-50 MG tablet Take 1 tablet by mouth daily. 12/23/16   Juline Patch, MD  trimethoprim-polymyxin b Mayra Neer) ophthalmic solution  10/13/14   [provider]    Allergies Ampicillin; Penicillins; and Vibramycin [doxycycline calcium]  Family History  Problem Relation Age of Onset  . COPD Sister   . Cancer Maternal Aunt        breast  . Cancer Maternal Uncle        kidney    Social History Social History   Tobacco Use  . Smoking status: Former Smoker    Last attempt to quit: 02/24/1993    Years since quitting: 24.3  . Smokeless tobacco: Never Used  Substance Use Topics  . Alcohol use: No    Alcohol/week: 0.0 oz  . Drug use: No    Review of Systems  Constitutional: Positive fever/chills and body aches for 2 weeks Eyes: No visual changes. ENT: No sore throat.  Positive for runny nose and congestion Respiratory: Positive for wheezing and cough Genitourinary: Negative for dysuria. Musculoskeletal:  Negative for back pain. Skin: Negative for rash.    ____________________________________________   PHYSICAL EXAM:  VITAL SIGNS: ED Triage Vitals  Enc Vitals Group     BP 06/09/17 1148 (!) 146/66     Pulse Rate 06/09/17 1148 67     Resp 06/09/17 1148 16     Temp 06/09/17 1148 98.5 F (36.9 C)     Temp Source 06/09/17 1148 Oral     SpO2 06/09/17 1148 98 %     Weight 06/09/17 1149 255 lb (115.7 kg)     Height 06/09/17 1149 5\' 11"  (1.803 m)     Head  Circumference --      Peak Flow --      Pain Score 06/09/17 1149 8     Pain Loc --      Pain Edu? --      Excl. in [place]? --     Constitutional: Alert and oriented. Well appearing and in no acute distress. Eyes: Conjunctivae are normal.  Head: Atraumatic. Ears: TMs are clear bilaterally Nose: No congestion/rhinnorhea. Mouth/Throat: Mucous membranes are moist.  Throat is normal Cardiovascular: Normal rate, regular rhythm.  Heart sounds are normal Respiratory: Normal respiratory effort.  No retractions, lungs clear to auscultation GU: deferred Musculoskeletal: FROM all extremities, warm and well perfused Neurologic:  Normal speech and language.  Skin:  Skin is warm, dry and intact. No rash noted. Psychiatric: Mood and affect are normal. Speech and behavior are normal.  ____________________________________________   LABS (all labs ordered are listed, but only abnormal results are displayed)  Labs Reviewed - No data to display ____________________________________________   ____________________________________________  RADIOLOGY  Chest x-ray is negative for pneumonia  ____________________________________________   PROCEDURES  Procedure(s) performed: DuoNeb  Procedures    ____________________________________________   INITIAL IMPRESSION / ASSESSMENT AND PLAN / ED COURSE  Pertinent labs & imaging results that were available during my care of the patient were reviewed by me and considered in my medical decision  making (see chart for details).  Patient is 67 year old female complaining of cough and congestion for 2 weeks.  She states she has had a low-grade fever at home.  She states she has been wheezing and producing a lot of phlegm.  She denies any chest pain, she denies any swelling in the extremities  On physical exam she appears congested, she has diffuse wheezing, she has no pitting edema in the lower extremities.  Chest x-ray and DuoNeb were ordered   Chest x-ray is negative for pneumonia.  Patient states she has increased air movement after the DuoNeb.  Diagnosis is acute upper respiratory infection.  Patient was given a prescription for Z-Pak, Tessalon Perles, and Flonase.  She was instructed to drink plenty of fluids.  If she is worsening she should return to the emergency department.  If she is not better in 3 days she should see her regular doctor.  Patient states she understands and was discharged in stable condition  As part of my medical decision making, I reviewed the following data within the [facility] notes reviewed and incorporated, Old chart reviewed, Radiograph reviewed chest x-ray is negative for pneumonia, Notes from prior ED visits and [place] Controlled Substance Database  ____________________________________________   FINAL CLINICAL IMPRESSION(S) / ED DIAGNOSES  Final diagnoses:  Acute upper respiratory infection      NEW MEDICATIONS STARTED DURING THIS VISIT:  Discharge Medication List as of 06/09/2017  2:11 PM    START taking these medications   Details  azithromycin (ZITHROMAX Z-PAK) 250 MG tablet 2 pills today then 1 pill a day for 4 days, Print    benzonatate (TESSALON) 200 MG capsule Take 1 capsule (200 mg total) by mouth 3 (three) times daily as needed for cough., Starting Tue 06/09/2017, Print    fluticasone (FLONASE) 50 MCG/ACT nasal spray Place 1 spray into both nostrils daily., Starting Tue 06/09/2017, Until Wed 06/09/2018, Print           Note:  This document was prepared using Dragon voice recognition software and may include unintentional dictation errors.    Versie Starks, PA-C 06/09/17 1559  Merlyn Lot, MD 06/09/17 [phone]

## 2017-06-16 ENCOUNTER — Encounter: Payer: Self-pay | Admitting: Student in an Organized Health Care Education/Training Program

## 2017-06-16 ENCOUNTER — Ambulatory Visit
Payer: Medicare Other | Attending: Student in an Organized Health Care Education/Training Program | Admitting: Student in an Organized Health Care Education/Training Program

## 2017-06-16 ENCOUNTER — Other Ambulatory Visit: Payer: Self-pay

## 2017-06-16 VITALS — BP 132/79 | HR 93 | Temp 98.6°F | Resp 18 | Ht 71.0 in | Wt 255.0 lb

## 2017-06-16 DIAGNOSIS — M5136 Other intervertebral disc degeneration, lumbar region: Secondary | ICD-10-CM

## 2017-06-16 DIAGNOSIS — M47818 Spondylosis without myelopathy or radiculopathy, sacral and sacrococcygeal region: Secondary | ICD-10-CM | POA: Diagnosis not present

## 2017-06-16 DIAGNOSIS — M47812 Spondylosis without myelopathy or radiculopathy, cervical region: Secondary | ICD-10-CM | POA: Diagnosis not present

## 2017-06-16 DIAGNOSIS — Z880 Allergy status to penicillin: Secondary | ICD-10-CM | POA: Insufficient documentation

## 2017-06-16 DIAGNOSIS — Z7982 Long term (current) use of aspirin: Secondary | ICD-10-CM | POA: Insufficient documentation

## 2017-06-16 DIAGNOSIS — S93491D Sprain of other ligament of right ankle, subsequent encounter: Secondary | ICD-10-CM | POA: Diagnosis not present

## 2017-06-16 DIAGNOSIS — E782 Mixed hyperlipidemia: Secondary | ICD-10-CM | POA: Insufficient documentation

## 2017-06-16 DIAGNOSIS — G894 Chronic pain syndrome: Secondary | ICD-10-CM | POA: Diagnosis not present

## 2017-06-16 DIAGNOSIS — D649 Anemia, unspecified: Secondary | ICD-10-CM | POA: Insufficient documentation

## 2017-06-16 DIAGNOSIS — M5416 Radiculopathy, lumbar region: Secondary | ICD-10-CM | POA: Diagnosis not present

## 2017-06-16 DIAGNOSIS — Z79890 Hormone replacement therapy (postmenopausal): Secondary | ICD-10-CM | POA: Diagnosis not present

## 2017-06-16 DIAGNOSIS — M5116 Intervertebral disc disorders with radiculopathy, lumbar region: Secondary | ICD-10-CM | POA: Insufficient documentation

## 2017-06-16 DIAGNOSIS — M48062 Spinal stenosis, lumbar region with neurogenic claudication: Secondary | ICD-10-CM | POA: Insufficient documentation

## 2017-06-16 DIAGNOSIS — S46001S Unspecified injury of muscle(s) and tendon(s) of the rotator cuff of right shoulder, sequela: Secondary | ICD-10-CM | POA: Diagnosis not present

## 2017-06-16 DIAGNOSIS — M19011 Primary osteoarthritis, right shoulder: Secondary | ICD-10-CM | POA: Diagnosis not present

## 2017-06-16 DIAGNOSIS — K219 Gastro-esophageal reflux disease without esophagitis: Secondary | ICD-10-CM | POA: Insufficient documentation

## 2017-06-16 DIAGNOSIS — M47816 Spondylosis without myelopathy or radiculopathy, lumbar region: Secondary | ICD-10-CM | POA: Diagnosis not present

## 2017-06-16 DIAGNOSIS — Z87891 Personal history of nicotine dependence: Secondary | ICD-10-CM | POA: Diagnosis not present

## 2017-06-16 DIAGNOSIS — M47896 Other spondylosis, lumbar region: Secondary | ICD-10-CM | POA: Insufficient documentation

## 2017-06-16 DIAGNOSIS — M542 Cervicalgia: Secondary | ICD-10-CM | POA: Diagnosis not present

## 2017-06-16 DIAGNOSIS — E079 Disorder of thyroid, unspecified: Secondary | ICD-10-CM | POA: Diagnosis not present

## 2017-06-16 DIAGNOSIS — G43909 Migraine, unspecified, not intractable, without status migrainosus: Secondary | ICD-10-CM | POA: Insufficient documentation

## 2017-06-16 DIAGNOSIS — Z5181 Encounter for therapeutic drug level monitoring: Secondary | ICD-10-CM | POA: Diagnosis not present

## 2017-06-16 DIAGNOSIS — M19111 Post-traumatic osteoarthritis, right shoulder: Secondary | ICD-10-CM

## 2017-06-16 DIAGNOSIS — N189 Chronic kidney disease, unspecified: Secondary | ICD-10-CM | POA: Diagnosis not present

## 2017-06-16 DIAGNOSIS — Z79899 Other long term (current) drug therapy: Secondary | ICD-10-CM | POA: Diagnosis not present

## 2017-06-16 DIAGNOSIS — X58XXXD Exposure to other specified factors, subsequent encounter: Secondary | ICD-10-CM | POA: Diagnosis not present

## 2017-06-16 DIAGNOSIS — M19012 Primary osteoarthritis, left shoulder: Secondary | ICD-10-CM | POA: Insufficient documentation

## 2017-06-16 DIAGNOSIS — S93491A Sprain of other ligament of right ankle, initial encounter: Secondary | ICD-10-CM | POA: Insufficient documentation

## 2017-06-16 DIAGNOSIS — I129 Hypertensive chronic kidney disease with stage 1 through stage 4 chronic kidney disease, or unspecified chronic kidney disease: Secondary | ICD-10-CM | POA: Insufficient documentation

## 2017-06-16 MED ORDER — HYDROCODONE-ACETAMINOPHEN 7.5-325 MG PO TABS: 1.0000 | ORAL_TABLET | Freq: Three times a day (TID) | ORAL | 0 refills | Status: DC | PRN

## 2017-06-16 NOTE — Progress Notes (Signed)
Patient's Name: Michele Moore  MRN: [id number]  Referring Provider: Juline Patch, MD  DOB: [birth year]  PCP: Juline Patch, MD  DOS: 06/16/2017  Note by: Gillis Santa, MD  Service setting: Ambulatory outpatient  Specialty: Interventional Pain Management  Location: [facility] (AMB) Pain Management Facility    Patient type: Established   Primary Reason(s) for Visit: Encounter for prescription drug management. (Level of risk: moderate)  CC: Neck Pain; Shoulder Pain (right); and Ankle Pain (right)  HPI  Michele Moore is a 67 y.o. year old, female patient, who comes today for a medication management evaluation. She has Pain, lower extremity; Lumbar radiculopathy; Lumbar degenerative disc disease; Chronic pain syndrome; Spinal stenosis, lumbar region, with neurogenic claudication; Cervical facet syndrome; Osteoarthritis of right shoulder due to rotator cuff injury; and Sprain of anterior talofibular ligament of right ankle on their problem list. Her primarily concern today is the Neck Pain; Shoulder Pain (right); and Ankle Pain (right)  Pain Assessment: Location:   Neck Radiating: radiates to right shoulder Onset: More than a month ago Duration: Chronic pain Quality: Aching Severity: 8 /10 (self-reported pain score)  Note: Reported level is inconsistent with clinical observations.                         When using our objective Pain Scale, levels between 6 and 10/10 are said to belong in an emergency room, as it progressively worsens from a 6/10, described as severely limiting, requiring emergency care not usually available at an outpatient pain management facility. At a 6/10 level, communication becomes difficult and requires great effort. Assistance to reach the emergency department may be required. Facial flushing and profuse sweating along with potentially dangerous increases in heart rate and blood pressure will be evident. Effect on ADL:   Timing: Constant Modifying factors: medications  Michele Moore was  last scheduled for an appointment on 04/21/2017 for medication management. During today's appointment we reviewed Michele Moore's chronic pain status, as well as her outpatient medication regimen.  On 3/20 she fell back and twisted her ankle as she was getting into her truck, went to urgent care, CT of the head and back were performed and were negative. She states her shoulder is bothering her back is bothering her and her ankle is bothering her. She had an x-ray of her right ankle that was negative for fracture. She is wearing a right shoe boot is having pain on the lateral aspect of her ankle.  She also states that she is battling an upper respiratory tract infection and that the allergies are making her congestion and breathing difficult at times.  She is taking an anti-allergy medicine.  She has finished a steroid taper.  From a chronic pain standpoint, she returns today for medication refill.   The patient  reports that she does not use drugs. Her body mass index is 35.57 kg/m.  Further details on both, my assessment(s), as well as the proposed treatment plan, please see below.  Controlled Substance Pharmacotherapy Assessment REMS (Risk Evaluation and Mitigation Strategy)  Analgesic: Hydrocodone 7.5 mg TID prn, #90/month MME/day: 22.5 mg/day.   Dewayne Shorter, RN  06/16/2017  9:57 AM  Signed Nursing Pain Medication Assessment:  Safety precautions to be maintained throughout the outpatient stay will include: orient to surroundings, keep bed in low position, maintain call bell within reach at all times, provide assistance with transfer out of bed and ambulation.  Medication Inspection Compliance: Pill count conducted under  aseptic conditions, in front of the patient. Neither the pills nor the bottle was removed from the patient's sight at any time. Once count was completed pills were immediately returned to the patient in their original bottle.  Medication: Hydrocodone/APAP Pill/Patch Count: 23 of  90 pills remain Pill/Patch Appearance: Markings consistent with prescribed medication Bottle Appearance: Standard pharmacy container. Clearly labeled. Filled Date: 04 / 03/ 2019 Last Medication intake:  Today   Pharmacokinetics: Liberation and absorption (onset of action): WNL Distribution (time to peak effect): WNL Metabolism and excretion (duration of action): WNL         Pharmacodynamics: Desired effects: Analgesia: Michele Moore reports >50% benefit. Functional ability: Patient reports that medication allows her to accomplish basic ADLs Clinically meaningful improvement in function (CMIF): Sustained CMIF goals met Perceived effectiveness: Described as relatively effective, allowing for increase in activities of daily living (ADL) Undesirable effects: Side-effects or Adverse reactions: None reported Monitoring: [place] PMP: Online review of the past 42-monthperiod conducted. Compliant with practice rules and regulations Last UDS on record: Summary  Date Value Ref Range Status  10/14/2016 FINAL  Final    Comment:    ==================================================================== TOXASSURE COMP DRUG ANALYSIS,UR ==================================================================== Test                             Result       Flag       Units Drug Present and Declared for Prescription Verification   Gabapentin                     PRESENT      EXPECTED   Acetaminophen                  PRESENT      EXPECTED Drug Present not Declared for Prescription Verification   Dextrorphan/Levorphanol        PRESENT      UNEXPECTED    Dextrorphan is an expected metabolite of dextromethorphan, an    over-the-counter or prescription cough suppressant. Levorphanol    is a scheduled prescription medication. Dextrorphan cannot be    distinguished from levorphanol by the method used for analysis. Drug Absent but Declared for Prescription Verification   Hydrocodone                    Not Detected  UNEXPECTED ng/mg creat   Oxycodone                      Not Detected UNEXPECTED ng/mg creat   Ephedrine/Pseudoephedrine      Not Detected UNEXPECTED   Tramadol                       Not Detected UNEXPECTED   Pregabalin                     Not Detected UNEXPECTED   Tizanidine                     Not Detected UNEXPECTED    Tizanidine, as indicated in the declared medication list, is not    always detected even when used as directed.   Duloxetine                     Not Detected UNEXPECTED   Salicylate  Not Detected UNEXPECTED    Aspirin, as indicated in the declared medication list, is not    always detected even when used as directed.   Chlorpheniramine               Not Detected UNEXPECTED   Hydroxyzine                    Not Detected UNEXPECTED   Lidocaine                      Not Detected UNEXPECTED    Lidocaine, as indicated in the declared medication list, is not    always detected even when used as directed. ==================================================================== Test                      Result    Flag   Units      Ref Range   Creatinine              101              mg/dL      >=20 ==================================================================== Declared Medications:  The flagging and interpretation on this report are based on the  following declared medications.  Unexpected results may arise from  inaccuracies in the declared medications.  **Note: The testing scope of this panel includes these medications:  Chlorpheniramine (Tussionex)  Duloxetine  Gabapentin  Hydrocodone (Hydrocodone-Acetaminophen)  Hydrocodone (Tussionex)  Hydroxyzine  Oxycodone (Oxycodone Acetaminophen)  Pregabalin  Pseudoephedrine (Fexofenadine Pseudoephedrine)  Tramadol  **Note: The testing scope of this panel does not include small to  moderate amounts of these reported medications:  Acetaminophen  Acetaminophen (Hydrocodone-Acetaminophen)  Acetaminophen  (Oxycodone Acetaminophen)  Aspirin  Lidocaine  Tizanidine  **Note: The testing scope of this panel does not include following  reported medications:  Cefuroxime axetil  Fexofenadine (Fexofenadine Pseudoephedrine)  Hydralazine  Hydrochlorothiazide (Triamterine-Hydrochlorthzide)  Ketoconazole  Levothyroxine  Meloxicam  Mupirocin  Nystatin  Omeprazole  Polyethylene Glycol  Polymyxin  Potassium  Prednisone  Ranitidine  Supplement (Omega-3)  Topical  Triamcinolone acetonide  Triamterene (Triamterine-Hydrochlorthzide)  Trimethoprim ==================================================================== For clinical consultation, please call [phone]. ====================================================================    UDS interpretation: Compliant          Medication Assessment Form: Reviewed. Patient indicates being compliant with therapy Treatment compliance: Compliant Risk Assessment Profile: Aberrant behavior: See prior evaluations. None observed or detected today Comorbid factors increasing risk of overdose: See prior notes. No additional risks detected today Risk of substance use disorder (SUD): Low Opioid Risk Tool - 04/21/17 0953      Family History of Substance Abuse   Alcohol  Negative    Illegal Drugs  Negative    Rx Drugs  Negative      Personal History of Substance Abuse   Alcohol  Negative    Illegal Drugs  Negative    Rx Drugs  Negative      Age   Age between 63-45 years   No      Psychological Disease   Psychological Disease  Negative    Depression  Negative      Total Score   Opioid Risk Tool Scoring  0    Opioid Risk Interpretation  Low Risk      ORT Scoring interpretation table:  Score <3 = Low Risk for SUD  Score between 4-7 = Moderate Risk for SUD  Score >8 = High Risk for Opioid Abuse   Risk Mitigation Strategies:  Patient Counseling:  Covered Patient-Prescriber Agreement (PPA): Present and active  Notification to other  healthcare providers: Done  Pharmacologic Plan: No change in therapy, at this time.             Laboratory Chemistry  Inflammation Markers (CRP: Acute Phase) (ESR: Chronic Phase) No results found for: CRP, ESRSEDRATE, LATICACIDVEN                       Rheumatology Markers No results found for: RF, ANA, LABURIC, URICUR, LYMEIGGIGMAB, LYMEABIGMQN                      Renal Function Markers Lab Results  Component Value Date   BUN 30 (H) 12/23/2016   CREATININE 1.96 (H) 12/23/2016   GFRAA 30 (L) 12/23/2016   GFRNONAA 26 (L) 12/23/2016                              Hepatic Function Markers Lab Results  Component Value Date   AST 21 12/27/2013   ALT 19 12/27/2013   ALBUMIN 4.5 12/23/2016   ALKPHOS 91 12/27/2013                        Electrolytes Lab Results  Component Value Date   NA 141 12/23/2016   K 4.5 12/23/2016   CL 103 12/23/2016   CALCIUM 9.7 12/23/2016   MG 2.2 12/27/2013   PHOS 3.0 12/23/2016                        Neuropathy Markers No results found for: VITAMINB12, FOLATE, HGBA1C, HIV                      Bone Pathology Markers No results found for: VD25OH, VD125OH2TOT, XB2841LK4, MW1027OZ3, 25OHVITD1, 25OHVITD2, 25OHVITD3, TESTOFREE, TESTOSTERONE                       Coagulation Parameters Lab Results  Component Value Date   PLT 150 08/03/2015                        Cardiovascular Markers Lab Results  Component Value Date   CKTOTAL 154 12/27/2013   CKMB 0.8 12/27/2013   TROPONINI < 0.02 12/27/2013   HGB 10.6 (L) 08/03/2015   HCT 32.4 (L) 08/03/2015                         CA Markers No results found for: CEA, CA125, LABCA2                      Note: Lab results reviewed.  Recent Diagnostic Imaging Results  DG Chest 2 View CLINICAL DATA:  Cough and wheezing  EXAM: CHEST - 2 VIEW  COMPARISON:  12/22/2015  FINDINGS: Increased interstitial prominence. No focal consolidation. No pulmonary edema, pneumothorax or pleural effusion.  Cardiomediastinal contours are normal.  IMPRESSION: No active cardiopulmonary disease.  Electronically Signed   By: Ulyses Jarred M.D.   On: 06/09/2017 13:53  Complexity Note: Imaging results reviewed. Results shared with Ms. Rauls, using Layman's terms.                         Meds   Current Outpatient Medications:  .  aspirin 81 MG  tablet, Take 81 mg by mouth daily., Disp: , Rfl:  .  azithromycin (ZITHROMAX Z-PAK) 250 MG tablet, 2 pills today then 1 pill a day for 4 days, Disp: 6 each, Rfl: 0 .  benzonatate (TESSALON) 200 MG capsule, Take 1 capsule (200 mg total) by mouth 3 (three) times daily as needed for cough., Disp: 30 capsule, Rfl: 0 .  Cholecalciferol (VITAMIN D3) 2000 units capsule, Take 2,000 Units by mouth daily., Disp: , Rfl:  .  doxycycline (VIBRAMYCIN) 100 MG capsule, TK 1 C PO BID, Disp: , Rfl: 0 .  ferrous sulfate 325 (65 FE) MG tablet, Take 325 mg by mouth daily with breakfast. otc, Disp: , Rfl:  .  fluticasone (FLONASE) 50 MCG/ACT nasal spray, Place 1 spray into both nostrils daily., Disp: 16 g, Rfl: 2 .  gabapentin (NEURONTIN) 300 MG capsule, TAKE 2 CAPSULES BY MOUTH NIGHTLY/ Perlie Stene, Disp: 60 capsule, Rfl: 1 .  hydrALAZINE (APRESOLINE) 50 MG tablet, Take 1 tablet (50 mg total) by mouth 2 (two) times daily., Disp: 60 tablet, Rfl: 5 .  HYDROcodone-acetaminophen (NORCO) 7.5-325 MG tablet, Take 1 tablet by mouth 3 (three) times daily as needed for severe pain. For Chronic Pain DNF: 06/25/17, 07/25/17, Disp: 90 tablet, Rfl: 0 .  ibuprofen (ADVIL,MOTRIN) 200 MG tablet, Take 400 mg by mouth as needed. otc, Disp: , Rfl:  .  ketoconazole (NIZORAL) 2 % cream, U UTD QHS, Disp: , Rfl:  .  levothyroxine (SYNTHROID) 200 MCG tablet, Take 1 tablet (200 mcg total) by mouth daily before breakfast., Disp: 30 tablet, Rfl: 5 .  Multiple Vitamins-Calcium (ONE-A-DAY WOMENS FORMULA PO), Take 1 tablet by mouth daily., Disp: , Rfl:  .  mupirocin cream (BACTROBAN) 2 %, Apply 1 application topically  2 (two) times daily., Disp: 15 g, Rfl: 0 .  omega-3 acid ethyl esters (LOVAZA) 1 g capsule, Take 2 capsules (2 g total) by mouth 2 (two) times daily., Disp: 120 capsule, Rfl: 5 .  omeprazole (PRILOSEC) 40 MG capsule, Take 1 capsule (40 mg total) by mouth daily., Disp: 30 capsule, Rfl: 5 .  Polyethyl Glycol-Propyl Glycol (SYSTANE OP), Apply to eye., Disp: , Rfl:  .  polyethylene glycol (MIRALAX / GLYCOLAX) packet, Take 17 g by mouth daily., Disp: 14 each, Rfl: 0 .  potassium chloride SA (K-DUR,KLOR-CON) 20 MEQ tablet, Take 1 tablet (20 mEq total) by mouth daily., Disp: 30 tablet, Rfl: 5 .  ranitidine (ZANTAC) 150 MG tablet, Take 1 tablet (150 mg total) by mouth 2 (two) times daily., Disp: 60 tablet, Rfl: 5 .  tiZANidine (ZANAFLEX) 4 MG capsule, Take 1 capsule (4 mg total) by mouth 2 (two) times daily as needed., Disp: 60 capsule, Rfl: 2 .  triamcinolone cream (KENALOG) 0.5 %, Apply topically., Disp: , Rfl:  .  triamterene-hydrochlorothiazide (MAXZIDE) 75-50 MG tablet, Take 1 tablet by mouth daily., Disp: 30 tablet, Rfl: 5 .  trimethoprim-polymyxin b (POLYTRIM) ophthalmic solution, , Disp: , Rfl:  .  VENTOLIN HFA 108 (90 Base) MCG/ACT inhaler, INHALE 2 PUFFS PO Q 4 H PRF WHEEZING OR SOB, Disp: , Rfl: 0 .  VIRTUSSIN A/C 100-10 MG/5ML syrup, TK 10 ML PO NIGHTLY PRN COU, Disp: , Rfl: 0  ROS  Constitutional: Denies any fever or chills Gastrointestinal: No reported hemesis, hematochezia, vomiting, or acute GI distress Musculoskeletal: Denies any acute onset joint swelling, redness, loss of ROM, or weakness Neurological: No reported episodes of acute onset apraxia, aphasia, dysarthria, agnosia, amnesia, paralysis, loss of coordination, or loss of consciousness  Allergies  Ms. Mannings is allergic to ampicillin; penicillins; and vibramycin [doxycycline calcium].  PFSH  Drug: Ms. Lafoy  reports that she does not use drugs. Alcohol:  reports that she does not drink alcohol. Tobacco:  reports that she quit  smoking about 24 years ago. She has never used smokeless tobacco. Medical:  has a past medical history of Anemia, Arthritis, Chronic kidney disease, Family history of adverse reaction to anesthesia, GERD (gastroesophageal reflux disease), H/O tooth extraction, Hemorrhoid, History of hiatal hernia, Hypertension, Migraines, Mixed hyperlipidemia, Multiple gastric ulcers, and Thyroid disease. Surgical: Ms. Orrego  has a past surgical history that includes Carpal tunnel release; Rotator cuff repair; Ankle surgery; Tubal ligation; Colonoscopy (2000?); Upper gi endoscopy (2000?); Joint replacement; Tonsillectomy; and Fusion of talonavicular joint (Right, 08/03/2015). Family: family history includes COPD in her sister; Cancer in her maternal aunt and maternal uncle.  Constitutional Exam  General appearance: Well nourished, well developed, and well hydrated. In no apparent acute distress Vitals:   06/16/17 0951  BP: 132/79  Pulse: 93  Resp: 18  Temp: 98.6 F (37 C)  SpO2: 98%  Weight: 255 lb (115.7 kg)  Height: '5\' 11"'  (1.803 m)   BMI Assessment: Estimated body mass index is 35.57 kg/m as calculated from the following:   Height as of this encounter: '5\' 11"'  (1.803 m).   Weight as of this encounter: 255 lb (115.7 kg).  BMI interpretation table: BMI level Category Range association with higher incidence of chronic pain  <18 kg/m2 Underweight   18.5-24.9 kg/m2 Ideal body weight   25-29.9 kg/m2 Overweight Increased incidence by 20%  30-34.9 kg/m2 Obese (Class I) Increased incidence by 68%  35-39.9 kg/m2 Severe obesity (Class II) Increased incidence by 136%  >40 kg/m2 Extreme obesity (Class III) Increased incidence by 254%   BMI Readings from Last 4 Encounters:  06/16/17 35.57 kg/m  06/09/17 35.57 kg/m  05/13/17 36.96 kg/m  04/21/17 35.57 kg/m   Wt Readings from Last 4 Encounters:  06/16/17 255 lb (115.7 kg)  06/09/17 255 lb (115.7 kg)  05/13/17 265 lb (120.2 kg)  04/21/17 255 lb (115.7 kg)   Psych/Mental status: Alert, oriented x 3 (person, place, & time)       Eyes: PERLA Respiratory: No evidence of acute respiratory distress  Cervical Spine Area Exam  Skin & Axial Inspection: No masses, redness, edema, swelling, or associated skin lesions Alignment: Symmetrical Functional ROM: Decreased ROM      Stability: No instability detected Muscle Tone/Strength: Functionally intact. No obvious neuro-muscular anomalies detected. Sensory (Neurological): Movement-associated pain right greater than left Palpation: No palpable anomalies              Upper Extremity (UE) Exam    Side: Right upper extremity  Side: Left upper extremity  Skin & Extremity Inspection: Skin color, temperature, and hair growth are WNL. No peripheral edema or cyanosis. No masses, redness, swelling, asymmetry, or associated skin lesions. No contractures.  Skin & Extremity Inspection: Skin color, temperature, and hair growth are WNL. No peripheral edema or cyanosis. No masses, redness, swelling, asymmetry, or associated skin lesions. No contractures.  Functional ROM: Unrestricted ROM          Functional ROM: Unrestricted ROM          Muscle Tone/Strength: Functionally intact. No obvious neuro-muscular anomalies detected.  Muscle Tone/Strength: Functionally intact. No obvious neuro-muscular anomalies detected.  Sensory (Neurological): Unimpaired          Sensory (Neurological): Unimpaired  Palpation: No palpable anomalies              Palpation: No palpable anomalies              Specialized Test(s): Deferred         Specialized Test(s): Deferred          Thoracic Spine Area Exam  Skin & Axial Inspection: No masses, redness, or swelling Alignment: Symmetrical Functional ROM: Unrestricted ROM Stability: No instability detected Muscle Tone/Strength: Functionally intact. No obvious neuro-muscular anomalies detected. Sensory (Neurological): Unimpaired Muscle strength & Tone: No palpable anomalies  Lumbar  Spine Area Exam  Skin & Axial Inspection: No masses, redness, or swelling Alignment: Symmetrical Functional ROM: Decreased ROM       Stability: No instability detected Muscle Tone/Strength: Functionally intact. No obvious neuro-muscular anomalies detected. Sensory (Neurological): Unimpaired Palpation: No palpable anomalies       Provocative Tests: Lumbar Hyperextension and rotation test: evaluation deferred today       Lumbar Lateral bending test: evaluation deferred today       Patrick's Maneuver: evaluation deferred today                    Gait & Posture Assessment  Ambulation: Unassisted Gait: Relatively normal for age and body habitus Posture: WNL   Lower Extremity Exam    Side: Right lower extremity  Side: Left lower extremity  Skin & Extremity Inspection: Skin color, temperature, and hair growth are WNL. No peripheral edema or cyanosis. No masses, redness, swelling, asymmetry, or associated skin lesions. No contractures.  Skin & Extremity Inspection: Skin color, temperature, and hair growth are WNL. No peripheral edema or cyanosis. No masses, redness, swelling, asymmetry, or associated skin lesions. No contractures.  Functional ROM: Decreased ROM          Functional ROM: Unrestricted ROM          Muscle Tone/Strength: Functionally intact. No obvious neuro-muscular anomalies detected. right ankle  Muscle Tone/Strength: Functionally intact. No obvious neuro-muscular anomalies detected.  Sensory (Neurological): Articular pain pattern  Sensory (Neurological): Unimpaired  Palpation: No palpable anomalies  Palpation: No palpable anomalies   Assessment  Primary Diagnosis & Pertinent Problem List: The primary encounter diagnosis was Lumbar radiculopathy. Diagnoses of Lumbar degenerative disc disease, Chronic pain syndrome, Spinal stenosis, lumbar region, with neurogenic claudication, SI joint arthritis, Lumbar spondylosis, Cervical facet syndrome, Osteoarthritis of right shoulder due to  rotator cuff injury, and Sprain of anterior talofibular ligament of right ankle, subsequent encounter were also pertinent to this visit.  Status Diagnosis  Persistent Persistent Persistent 1. Lumbar radiculopathy   2. Lumbar degenerative disc disease   3. Chronic pain syndrome   4. Spinal stenosis, lumbar region, with neurogenic claudication   5. SI joint arthritis   6. Lumbar spondylosis   7. Cervical facet syndrome   8. Osteoarthritis of right shoulder due to rotator cuff injury   9. Sprain of anterior talofibular ligament of right ankle, subsequent encounter     Problems updated and reviewed during this visit: Problem  Cervical Facet Syndrome  Osteoarthritis of Right Shoulder Due to Rotator Cuff Injury  Sprain of Anterior Talofibular Ligament of Right Ankle   Plan of Care  Pharmacotherapy (Medications Ordered): Meds ordered this encounter  Medications  . DISCONTD: HYDROcodone-acetaminophen (NORCO) 7.5-325 MG tablet    Sig: Take 1 tablet by mouth 3 (three) times daily as needed for severe pain. For Chronic Pain DNF: 06/25/17, 07/25/17    Dispense:  90 tablet  Refill:  0  . HYDROcodone-acetaminophen (NORCO) 7.5-325 MG tablet    Sig: Take 1 tablet by mouth 3 (three) times daily as needed for severe pain. For Chronic Pain DNF: 06/25/17, 07/25/17    Dispense:  90 tablet    Refill:  0    Considering:   -L-ESI w/o sedation once URI improves    Provider-requested follow-up: Return in about 8 weeks (around 08/11/2017) for Medication Management. Time Note: Greater than 50% of the 25 minute(s) of face-to-face time spent with Ms. Danis, was spent in counseling/coordination of care regarding: the appropriate use of the pain scale, Ms. Walkowski's primary cause of pain, the treatment plan, treatment alternatives, the risks and possible complications of proposed treatment, the appropriate use of her medications, realistic expectations, the medication agreement and the patient's responsibilities when  it comes to controlled substances. No future appointments.  Primary Care Physician: Juline Patch, MD Location: [facility] Note by: Gillis Santa, M.D Date: 06/16/2017; Time: 10:14 AM  Patient Instructions  you have been given 2 scripts for hydrocodone today.

## 2017-06-16 NOTE — Patient Instructions (Signed)
you have been given 2 scripts for hydrocodone today.

## 2017-06-16 NOTE — Progress Notes (Signed)
Nursing Pain Medication Assessment:  Safety precautions to be maintained throughout the outpatient stay will include: orient to surroundings, keep bed in low position, maintain call bell within reach at all times, provide assistance with transfer out of bed and ambulation.  Medication Inspection Compliance: Pill count conducted under aseptic conditions, in front of the patient. Neither the pills nor the bottle was removed from the patient's sight at any time. Once count was completed pills were immediately returned to the patient in their original bottle.  Medication: Hydrocodone/APAP Pill/Patch Count: 23 of 90 pills remain Pill/Patch Appearance: Markings consistent with prescribed medication Bottle Appearance: Standard pharmacy container. Clearly labeled. Filled Date: 04 / 03/ 2019 Last Medication intake:  Today

## 2017-06-17 DIAGNOSIS — M65342 Trigger finger, left ring finger: Secondary | ICD-10-CM | POA: Diagnosis not present

## 2017-06-17 DIAGNOSIS — R768 Other specified abnormal immunological findings in serum: Secondary | ICD-10-CM | POA: Diagnosis not present

## 2017-06-17 DIAGNOSIS — R682 Dry mouth, unspecified: Secondary | ICD-10-CM | POA: Diagnosis not present

## 2017-06-17 DIAGNOSIS — M79642 Pain in left hand: Secondary | ICD-10-CM | POA: Diagnosis not present

## 2017-06-17 DIAGNOSIS — H04123 Dry eye syndrome of bilateral lacrimal glands: Secondary | ICD-10-CM | POA: Diagnosis not present

## 2017-06-17 DIAGNOSIS — S6992XA Unspecified injury of left wrist, hand and finger(s), initial encounter: Secondary | ICD-10-CM | POA: Diagnosis not present

## 2017-06-17 DIAGNOSIS — M25442 Effusion, left hand: Secondary | ICD-10-CM | POA: Diagnosis not present

## 2017-06-17 DIAGNOSIS — M7989 Other specified soft tissue disorders: Secondary | ICD-10-CM | POA: Diagnosis not present

## 2017-06-17 DIAGNOSIS — M069 Rheumatoid arthritis, unspecified: Secondary | ICD-10-CM | POA: Insufficient documentation

## 2017-06-24 ENCOUNTER — Other Ambulatory Visit: Payer: Self-pay | Admitting: Family Medicine

## 2017-06-24 DIAGNOSIS — B351 Tinea unguium: Secondary | ICD-10-CM | POA: Diagnosis not present

## 2017-06-24 DIAGNOSIS — M9689 Other intraoperative and postprocedural complications and disorders of the musculoskeletal system: Secondary | ICD-10-CM | POA: Diagnosis not present

## 2017-06-24 DIAGNOSIS — M79675 Pain in left toe(s): Secondary | ICD-10-CM | POA: Diagnosis not present

## 2017-06-24 DIAGNOSIS — E785 Hyperlipidemia, unspecified: Secondary | ICD-10-CM

## 2017-06-24 DIAGNOSIS — Z6835 Body mass index (BMI) 35.0-35.9, adult: Principal | ICD-10-CM

## 2017-06-24 DIAGNOSIS — M79674 Pain in right toe(s): Secondary | ICD-10-CM | POA: Diagnosis not present

## 2017-06-24 DIAGNOSIS — S93491D Sprain of other ligament of right ankle, subsequent encounter: Secondary | ICD-10-CM | POA: Diagnosis not present

## 2017-06-25 DIAGNOSIS — Z79899 Other long term (current) drug therapy: Secondary | ICD-10-CM | POA: Diagnosis not present

## 2017-06-25 DIAGNOSIS — M1A00X0 Idiopathic chronic gout, unspecified site, without tophus (tophi): Secondary | ICD-10-CM | POA: Insufficient documentation

## 2017-06-25 DIAGNOSIS — M65342 Trigger finger, left ring finger: Secondary | ICD-10-CM | POA: Diagnosis not present

## 2017-06-25 DIAGNOSIS — R768 Other specified abnormal immunological findings in serum: Secondary | ICD-10-CM | POA: Diagnosis not present

## 2017-06-25 DIAGNOSIS — H04123 Dry eye syndrome of bilateral lacrimal glands: Secondary | ICD-10-CM | POA: Diagnosis not present

## 2017-06-25 DIAGNOSIS — R682 Dry mouth, unspecified: Secondary | ICD-10-CM | POA: Diagnosis not present

## 2017-06-30 DIAGNOSIS — M25562 Pain in left knee: Secondary | ICD-10-CM | POA: Diagnosis not present

## 2017-06-30 DIAGNOSIS — Z96653 Presence of artificial knee joint, bilateral: Secondary | ICD-10-CM | POA: Diagnosis not present

## 2017-06-30 DIAGNOSIS — T84022D Instability of internal right knee prosthesis, subsequent encounter: Secondary | ICD-10-CM | POA: Diagnosis not present

## 2017-06-30 DIAGNOSIS — E669 Obesity, unspecified: Secondary | ICD-10-CM | POA: Diagnosis not present

## 2017-06-30 DIAGNOSIS — M25561 Pain in right knee: Secondary | ICD-10-CM | POA: Diagnosis not present

## 2017-06-30 DIAGNOSIS — G8929 Other chronic pain: Secondary | ICD-10-CM | POA: Diagnosis not present

## 2017-06-30 DIAGNOSIS — M1A00X0 Idiopathic chronic gout, unspecified site, without tophus (tophi): Secondary | ICD-10-CM | POA: Diagnosis not present

## 2017-07-09 ENCOUNTER — Telehealth: Payer: Self-pay | Admitting: Student in an Organized Health Care Education/Training Program

## 2017-07-09 NOTE — Telephone Encounter (Signed)
Dr. Holley Raring has not written for Gabapentin since 12/2016. Patient insisted he wrote one in 02/2016. I called [facility], she verified that Dr. Holley Raring has not written it since 12/2016, but she has one on file from Dr. Brynda Greathouse that they can fill now. Patient notified.

## 2017-07-09 NOTE — Telephone Encounter (Signed)
Needs refill on gabapentin

## 2017-07-23 ENCOUNTER — Other Ambulatory Visit: Payer: Self-pay | Admitting: Family Medicine

## 2017-07-23 DIAGNOSIS — I10 Essential (primary) hypertension: Secondary | ICD-10-CM

## 2017-07-24 ENCOUNTER — Other Ambulatory Visit: Payer: Self-pay | Admitting: Family Medicine

## 2017-07-24 DIAGNOSIS — I10 Essential (primary) hypertension: Secondary | ICD-10-CM

## 2017-07-28 DIAGNOSIS — T84022D Instability of internal right knee prosthesis, subsequent encounter: Secondary | ICD-10-CM | POA: Diagnosis not present

## 2017-07-28 DIAGNOSIS — E669 Obesity, unspecified: Secondary | ICD-10-CM | POA: Diagnosis not present

## 2017-07-28 DIAGNOSIS — M1A00X0 Idiopathic chronic gout, unspecified site, without tophus (tophi): Secondary | ICD-10-CM | POA: Diagnosis not present

## 2017-07-28 DIAGNOSIS — Z96653 Presence of artificial knee joint, bilateral: Secondary | ICD-10-CM | POA: Diagnosis not present

## 2017-08-06 ENCOUNTER — Encounter: Payer: Self-pay | Admitting: Student in an Organized Health Care Education/Training Program

## 2017-08-06 ENCOUNTER — Ambulatory Visit
Payer: Medicare Other | Attending: Student in an Organized Health Care Education/Training Program | Admitting: Student in an Organized Health Care Education/Training Program

## 2017-08-06 ENCOUNTER — Other Ambulatory Visit: Payer: Self-pay

## 2017-08-06 VITALS — BP 126/77 | HR 72 | Temp 98.8°F | Resp 18 | Ht 69.0 in | Wt 248.0 lb

## 2017-08-06 DIAGNOSIS — Z791 Long term (current) use of non-steroidal anti-inflammatories (NSAID): Secondary | ICD-10-CM | POA: Insufficient documentation

## 2017-08-06 DIAGNOSIS — M48062 Spinal stenosis, lumbar region with neurogenic claudication: Secondary | ICD-10-CM | POA: Insufficient documentation

## 2017-08-06 DIAGNOSIS — M542 Cervicalgia: Secondary | ICD-10-CM | POA: Diagnosis not present

## 2017-08-06 DIAGNOSIS — M25552 Pain in left hip: Secondary | ICD-10-CM | POA: Diagnosis not present

## 2017-08-06 DIAGNOSIS — Z881 Allergy status to other antibiotic agents status: Secondary | ICD-10-CM | POA: Insufficient documentation

## 2017-08-06 DIAGNOSIS — Z880 Allergy status to penicillin: Secondary | ICD-10-CM | POA: Insufficient documentation

## 2017-08-06 DIAGNOSIS — Z87891 Personal history of nicotine dependence: Secondary | ICD-10-CM | POA: Diagnosis not present

## 2017-08-06 DIAGNOSIS — M5136 Other intervertebral disc degeneration, lumbar region: Secondary | ICD-10-CM | POA: Diagnosis not present

## 2017-08-06 DIAGNOSIS — M47818 Spondylosis without myelopathy or radiculopathy, sacral and sacrococcygeal region: Secondary | ICD-10-CM | POA: Diagnosis not present

## 2017-08-06 DIAGNOSIS — M2460 Ankylosis, unspecified joint: Secondary | ICD-10-CM | POA: Diagnosis not present

## 2017-08-06 DIAGNOSIS — Z760 Encounter for issue of repeat prescription: Secondary | ICD-10-CM | POA: Diagnosis not present

## 2017-08-06 DIAGNOSIS — Z79899 Other long term (current) drug therapy: Secondary | ICD-10-CM | POA: Insufficient documentation

## 2017-08-06 DIAGNOSIS — G894 Chronic pain syndrome: Secondary | ICD-10-CM | POA: Diagnosis not present

## 2017-08-06 DIAGNOSIS — Z7982 Long term (current) use of aspirin: Secondary | ICD-10-CM | POA: Diagnosis not present

## 2017-08-06 DIAGNOSIS — M5416 Radiculopathy, lumbar region: Secondary | ICD-10-CM | POA: Insufficient documentation

## 2017-08-06 DIAGNOSIS — Z79891 Long term (current) use of opiate analgesic: Secondary | ICD-10-CM | POA: Insufficient documentation

## 2017-08-06 DIAGNOSIS — Z9889 Other specified postprocedural states: Secondary | ICD-10-CM | POA: Insufficient documentation

## 2017-08-06 DIAGNOSIS — Z825 Family history of asthma and other chronic lower respiratory diseases: Secondary | ICD-10-CM | POA: Insufficient documentation

## 2017-08-06 DIAGNOSIS — M461 Sacroiliitis, not elsewhere classified: Secondary | ICD-10-CM | POA: Insufficient documentation

## 2017-08-06 DIAGNOSIS — M25511 Pain in right shoulder: Secondary | ICD-10-CM | POA: Diagnosis not present

## 2017-08-06 DIAGNOSIS — Z809 Family history of malignant neoplasm, unspecified: Secondary | ICD-10-CM | POA: Diagnosis not present

## 2017-08-06 DIAGNOSIS — M1A00X0 Idiopathic chronic gout, unspecified site, without tophus (tophi): Secondary | ICD-10-CM | POA: Diagnosis not present

## 2017-08-06 MED ORDER — HYDROCODONE-ACETAMINOPHEN 7.5-325 MG PO TABS: 1.0000 | ORAL_TABLET | Freq: Three times a day (TID) | ORAL | 0 refills | Status: DC | PRN

## 2017-08-06 MED ORDER — GABAPENTIN 300 MG PO CAPS: ORAL_CAPSULE | ORAL | 3 refills | Status: DC

## 2017-08-06 NOTE — Progress Notes (Signed)
Nursing Pain Medication Assessment:  Safety precautions to be maintained throughout the outpatient stay will include: orient to surroundings, keep bed in low position, maintain call bell within reach at all times, provide assistance with transfer out of bed and ambulation.  Medication Inspection Compliance: Pill count conducted under aseptic conditions, in front of the patient. Neither the pills nor the bottle was removed from the patient's sight at any time. Once count was completed pills were immediately returned to the patient in their original bottle.  Medication: Hydrocodone/APAP Pill/Patch Count: 50 of 90 pills remain Pill/Patch Appearance: Markings consistent with prescribed medication Bottle Appearance: Standard pharmacy container. Clearly labeled. Filled Date: 06/01 / 2019 Last Medication intake:  Yesterday

## 2017-08-06 NOTE — Patient Instructions (Addendum)
You have been given 3 prescriptions for Hydrocodone-acetamin. To last until 12/22/2017. Gabapentin sent to your pharmacy.  Schedule Lumbar epidural Injection.   ____________________________________________________________________________________________  Preparing for Procedure with Sedation  Instructions: . Oral Intake: Do not eat or drink anything for at least 8 hours prior to your procedure. . Transportation: Public transportation is not allowed. Bring an adult driver. The driver must be physically present in our waiting room before any procedure can be started. Marland Kitchen Physical Assistance: Bring an adult physically capable of assisting you, in the event you need help. This adult should keep you company at home for at least 6 hours after the procedure. . Blood Pressure Medicine: Take your blood pressure medicine with a sip of water the morning of the procedure. . Blood thinners:  . Diabetics on insulin: Notify the staff so that you can be scheduled 1st case in the morning. If your diabetes requires high dose insulin, take only  of your normal insulin dose the morning of the procedure and notify the staff that you have done so. . Preventing infections: Shower with an antibacterial soap the morning of your procedure. . Build-up your immune system: Take 1000 mg of Vitamin C with every meal (3 times a day) the day prior to your procedure. Marland Kitchen Antibiotics: Inform the staff if you have a condition or reason that requires you to take antibiotics before dental procedures. . Pregnancy: If you are pregnant, call and cancel the procedure. . Sickness: If you have a cold, fever, or any active infections, call and cancel the procedure. . Arrival: You must be in the facility at least 30 minutes prior to your scheduled procedure. . Children: Do not bring children with you. . Dress appropriately: Bring dark clothing that you would not mind if they get stained. . Valuables: Do not bring any jewelry or  valuables.  Procedure appointments are reserved for interventional treatments only. Marland Kitchen No Prescription Refills. . No medication changes will be discussed during procedure appointments. . No disability issues will be discussed.  Remember:  Regular Business hours are:  Monday to Thursday 8:00 AM to 4:00 PM  Provider's Schedule: Milinda Pointer, MD:  Procedure days: Tuesday and Thursday 7:30 AM to 4:00 PM  Gillis Santa, MD:  Procedure days: Monday and Wednesday 7:30 AM to 4:00 PM ____________________________________________________________________________________________  Epidural Steroid Injection Patient Information  Description: The epidural space surrounds the nerves as they exit the spinal cord.  In some patients, the nerves can be compressed and inflamed by a bulging disc or a tight spinal canal (spinal stenosis).  By injecting steroids into the epidural space, we can bring irritated nerves into direct contact with a potentially helpful medication.  These steroids act directly on the irritated nerves and can reduce swelling and inflammation which often leads to decreased pain.  Epidural steroids may be injected anywhere along the spine and from the neck to the low back depending upon the location of your pain.   After numbing the skin with local anesthetic (like Novocaine), a small needle is passed into the epidural space slowly.  You may experience a sensation of pressure while this is being done.  The entire block usually last less than 10 minutes.  Conditions which may be treated by epidural steroids:   Low back and leg pain  Neck and arm pain  Spinal stenosis  Post-laminectomy syndrome  Herpes zoster (shingles) pain  Pain from compression fractures  Preparation for the injection:  1. Do not eat any solid food or  dairy products within 8 hours of your appointment.  2. You may drink clear liquids up to 3 hours before appointment.  Clear liquids include water, black  coffee, juice or soda.  No milk or cream please. 3. You may take your regular medication, including pain medications, with a sip of water before your appointment  Diabetics should hold regular insulin (if taken separately) and take 1/2 normal NPH dos the morning of the procedure.  Carry some sugar containing items with you to your appointment. 4. A driver must accompany you and be prepared to drive you home after your procedure.  5. Bring all your current medications with your. 6. An IV may be inserted and sedation may be given at the discretion of the physician.   7. A blood pressure cuff, EKG and other monitors will often be applied during the procedure.  Some patients may need to have extra oxygen administered for a short period. 8. You will be asked to provide medical information, including your allergies, prior to the procedure.  We must know immediately if you are taking blood thinners (like Coumadin/Warfarin)  Or if you are allergic to IV iodine contrast (dye). We must know if you could possible be pregnant.  Possible side-effects:  Bleeding from needle site  Infection (rare, may require surgery)  Nerve injury (rare)  Numbness & tingling (temporary)  Difficulty urinating (rare, temporary)  Spinal headache ( a headache worse with upright posture)  Light -headedness (temporary)  Pain at injection site (several days)  Decreased blood pressure (temporary)  Weakness in arm/leg (temporary)  Pressure sensation in back/neck (temporary)  Call if you experience:  Fever/chills associated with headache or increased back/neck pain.  Headache worsened by an upright position.  New onset weakness or numbness of an extremity below the injection site  Hives or difficulty breathing (go to the emergency room)  Inflammation or drainage at the infection site  Severe back/neck pain  Any new symptoms which are concerning to you  Please note:  Although the local anesthetic injected  can often make your back or neck feel good for several hours after the injection, the pain will likely return.  It takes 3-7 days for steroids to work in the epidural space.  You may not notice any pain relief for at least that one week.  If effective, we will often do a series of three injections spaced 3-6 weeks apart to maximally decrease your pain.  After the initial series, we generally will wait several months before considering a repeat injection of the same type.  If you have any questions, please call [phone] [facility] ____________________________________________________________________________________________

## 2017-08-06 NOTE — Progress Notes (Signed)
Patient's Name: Michele Moore  MRN: [id number]  Referring Provider: Juline Patch, MD  DOB: [birth year]  PCP: Juline Patch, MD  DOS: 08/06/2017  Note by: Gillis Santa, MD  Service setting: Ambulatory outpatient  Specialty: Interventional Pain Management  Location: [facility] (AMB) Pain Management Facility    Patient type: Established   Primary Reason(s) for Visit: Encounter for prescription drug management. (Level of risk: moderate)  CC: Hip Pain (left); Back Pain (lower); and Shoulder Pain (right)  HPI  Michele Moore is a 67 y.o. year old, female patient, who comes today for a medication management evaluation. She has Pain, lower extremity; Lumbar radiculopathy; Lumbar degenerative disc disease; Chronic pain syndrome; Spinal stenosis, lumbar region, with neurogenic claudication; Cervical facet syndrome; Osteoarthritis of right shoulder due to rotator cuff injury; and Sprain of anterior talofibular ligament of right ankle on their problem list. Her primarily concern today is the Hip Pain (left); Back Pain (lower); and Shoulder Pain (right)  Pain Assessment: Location: Lower Back Radiating: denies Onset: More than a month ago Duration: Chronic pain Quality: Stabbing, Sharp Severity: 8 /10 (subjective, self-reported pain score)  Note: Reported level is inconsistent with clinical observations. Clinically the patient looks like a 4/10 A 4/10 is viewed as "Moderately Severe" and described as impossible to ignore for more than a few minutes. With effort, patients may still be able to manage work or participate in some social activities. Very difficult to concentrate. Signs of autonomic nervous system discharge are evident: dilated pupils (mydriasis); mild sweating (diaphoresis); sleep interference. Heart rate becomes elevated (>115 bpm). Diastolic blood pressure (lower number) rises above 100 mmHg. Patients find relief in laying down and not moving.       When using our objective Pain Scale, levels between 6 and  10/10 are said to belong in an emergency room, as it progressively worsens from a 6/10, described as severely limiting, requiring emergency care not usually available at an outpatient pain management facility. At a 6/10 level, communication becomes difficult and requires great effort. Assistance to reach the emergency department may be required. Facial flushing and profuse sweating along with potentially dangerous increases in heart rate and blood pressure will be evident. Effect on ADL:   Timing: Intermittent Modifying factors: medications BP: 126/77  HR: 72  Michele Moore was last scheduled for an appointment on 07/09/2017 for medication management. During today's appointment we reviewed Michele Moore's chronic pain status, as well as her outpatient medication regimen.  Patient presents today for medication management.  She is also endorsing bilateral neck pain and shoulder pain.  She is also endorsing axial low back pain that radiates into bilateral hips, left greater than right and also radiates down to bilateral feet in a dermatomal fashion.  The patient  reports that she does not use drugs. Her body mass index is 36.62 kg/m.  Further details on both, my assessment(s), as well as the proposed treatment plan, please see below.  Controlled Substance Pharmacotherapy Assessment REMS (Risk Evaluation and Mitigation Strategy)  Analgesic: Hydrocodone 7.5 mg 3 times daily as needed, quantity 90/month  MME/day: 22.5 mg/day.  Landis Martins, RN  08/06/2017 10:25 AM  Sign at close encounter Nursing Pain Medication Assessment:  Safety precautions to be maintained throughout the outpatient stay will include: orient to surroundings, keep bed in low position, maintain call bell within reach at all times, provide assistance with transfer out of bed and ambulation.  Medication Inspection Compliance: Pill count conducted under aseptic conditions, in front of the  patient. Neither the pills nor the bottle was removed  from the patient's sight at any time. Once count was completed pills were immediately returned to the patient in their original bottle.  Medication: Hydrocodone/APAP Pill/Patch Count: 50 of 90 pills remain Pill/Patch Appearance: Markings consistent with prescribed medication Bottle Appearance: Standard pharmacy container. Clearly labeled. Filled Date: 06/01 / 2019 Last Medication intake:  Yesterday Pharmacokinetics: Liberation and absorption (onset of action): WNL Distribution (time to peak effect): WNL Metabolism and excretion (duration of action): WNL         Pharmacodynamics: Desired effects: Analgesia: Michele Moore reports >50% benefit. Functional ability: Patient reports that medication allows her to accomplish basic ADLs Clinically meaningful improvement in function (CMIF): Sustained CMIF goals met Perceived effectiveness: Described as relatively effective, allowing for increase in activities of daily living (ADL) Undesirable effects: Side-effects or Adverse reactions: None reported Monitoring: [place] PMP: Online review of the past 51-monthperiod conducted. Compliant with practice rules and regulations Last UDS on record: Summary  Date Value Ref Range Status  10/14/2016 FINAL  Final    Comment:    ==================================================================== [facility]RE COMP DRUG ANALYSIS,UR ==================================================================== Test                             Result       Flag       Units Drug Present and Declared for Prescription Verification   Gabapentin                     PRESENT      EXPECTED   Acetaminophen                  PRESENT      EXPECTED Drug Present not Declared for Prescription Verification   Dextrorphan/Levorphanol        PRESENT      UNEXPECTED    Dextrorphan is an expected metabolite of dextromethorphan, an    over-the-counter or prescription cough suppressant. Levorphanol    is a scheduled prescription medication.  Dextrorphan cannot be    distinguished from levorphanol by the method used for analysis. Drug Absent but Declared for Prescription Verification   Hydrocodone                    Not Detected UNEXPECTED ng/mg creat   Oxycodone                      Not Detected UNEXPECTED ng/mg creat   Ephedrine/Pseudoephedrine      Not Detected UNEXPECTED   Tramadol                       Not Detected UNEXPECTED   Pregabalin                     Not Detected UNEXPECTED   Tizanidine                     Not Detected UNEXPECTED    Tizanidine, as indicated in the declared medication list, is not    always detected even when used as directed.   Duloxetine                     Not Detected UNEXPECTED   Salicylate                     Not Detected UNEXPECTED  Aspirin, as indicated in the declared medication list, is not    always detected even when used as directed.   Chlorpheniramine               Not Detected UNEXPECTED   Hydroxyzine                    Not Detected UNEXPECTED   Lidocaine                      Not Detected UNEXPECTED    Lidocaine, as indicated in the declared medication list, is not    always detected even when used as directed. ==================================================================== Test                      Result    Flag   Units      Ref Range   Creatinine              101              mg/dL      >=20 ==================================================================== Declared Medications:  The flagging and interpretation on this report are based on the  following declared medications.  Unexpected results may arise from  inaccuracies in the declared medications.  **Note: The testing scope of this panel includes these medications:  Chlorpheniramine (Tussionex)  Duloxetine  Gabapentin  Hydrocodone (Hydrocodone-Acetaminophen)  Hydrocodone (Tussionex)  Hydroxyzine  Oxycodone (Oxycodone Acetaminophen)  Pregabalin  Pseudoephedrine (Fexofenadine Pseudoephedrine)  Tramadol   **Note: The testing scope of this panel does not include small to  moderate amounts of these reported medications:  Acetaminophen  Acetaminophen (Hydrocodone-Acetaminophen)  Acetaminophen (Oxycodone Acetaminophen)  Aspirin  Lidocaine  Tizanidine  **Note: The testing scope of this panel does not include following  reported medications:  Cefuroxime axetil  Fexofenadine (Fexofenadine Pseudoephedrine)  Hydralazine  Hydrochlorothiazide (Triamterine-Hydrochlorthzide)  Ketoconazole  Levothyroxine  Meloxicam  Mupirocin  Nystatin  Omeprazole  Polyethylene Glycol  Polymyxin  Potassium  Prednisone  Ranitidine  Supplement (Omega-3)  Topical  Triamcinolone acetonide  Triamterene (Triamterine-Hydrochlorthzide)  Trimethoprim ==================================================================== For clinical consultation, please call [phone]. ====================================================================    UDS interpretation: Compliant          Medication Assessment Form: Reviewed. Patient indicates being compliant with therapy Treatment compliance: Compliant Risk Assessment Profile: Aberrant behavior: See prior evaluations. None observed or detected today Comorbid factors increasing risk of overdose: See prior notes. No additional risks detected today Risk of substance use disorder (SUD): Low  ORT Scoring interpretation table:  Score <3 = Low Risk for SUD  Score between 4-7 = Moderate Risk for SUD  Score >8 = High Risk for Opioid Abuse   Risk Mitigation Strategies:  Patient Counseling: Covered Patient-Prescriber Agreement (PPA): Present and active  Notification to other healthcare providers: Done  Pharmacologic Plan: No change in therapy, at this time.             Laboratory Chemistry  Inflammation Markers (CRP: Acute Phase) (ESR: Chronic Phase) No results found for: CRP, ESRSEDRATE, LATICACIDVEN                       Rheumatology Markers No results found  for: RF, ANA, LABURIC, URICUR, LYMEIGGIGMAB, LYMEABIGMQN, HLAB27                      Renal Function Markers Lab Results  Component Value Date   BUN 30 (H) 12/23/2016   CREATININE 1.96 (H) 12/23/2016  BCR 15 12/23/2016   GFRAA 30 (L) 12/23/2016   GFRNONAA 26 (L) 12/23/2016                             Hepatic Function Markers Lab Results  Component Value Date   AST 21 12/27/2013   ALT 19 12/27/2013   ALBUMIN 4.5 12/23/2016   ALKPHOS 91 12/27/2013                        Electrolytes Lab Results  Component Value Date   NA 141 12/23/2016   K 4.5 12/23/2016   CL 103 12/23/2016   CALCIUM 9.7 12/23/2016   MG 2.2 12/27/2013   PHOS 3.0 12/23/2016                        Neuropathy Markers No results found for: VITAMINB12, FOLATE, HGBA1C, HIV                      Bone Pathology Markers No results found for: VD25OH, VD125OH2TOT, HO1224MG5, OI3704UG8, 25OHVITD1, 25OHVITD2, 25OHVITD3, TESTOFREE, TESTOSTERONE                       Coagulation Parameters Lab Results  Component Value Date   PLT 150 08/03/2015                        Cardiovascular Markers Lab Results  Component Value Date   CKTOTAL 154 12/27/2013   CKMB 0.8 12/27/2013   TROPONINI < 0.02 12/27/2013   HGB 10.6 (L) 08/03/2015   HCT 32.4 (L) 08/03/2015                         CA Markers No results found for: CEA, CA125, LABCA2                      Note: Lab results reviewed.  Recent Diagnostic Imaging Results  DG Chest 2 View CLINICAL DATA:  Cough and wheezing  EXAM: CHEST - 2 VIEW  COMPARISON:  12/22/2015  FINDINGS: Increased interstitial prominence. No focal consolidation. No pulmonary edema, pneumothorax or pleural effusion. Cardiomediastinal contours are normal.  IMPRESSION: No active cardiopulmonary disease.  Electronically Signed   By: Ulyses Jarred M.D.   On: 06/09/2017 13:53  Complexity Note: Imaging results reviewed. Results shared with Ms. Hush, using Layman's terms.                          Meds   Current Outpatient Medications:  .  aspirin 81 MG tablet, Take 81 mg by mouth daily., Disp: , Rfl:  .  Cholecalciferol (VITAMIN D3) 2000 units capsule, Take 2,000 Units by mouth daily., Disp: , Rfl:  .  ferrous sulfate 325 (65 FE) MG tablet, Take 325 mg by mouth daily with breakfast. otc, Disp: , Rfl:  .  fluticasone (FLONASE) 50 MCG/ACT nasal spray, Place 1 spray into both nostrils daily., Disp: 16 g, Rfl: 2 .  gabapentin (NEURONTIN) 300 MG capsule, TAKE 2 CAPSULES BY MOUTH NIGHTLY/ Lindzie Boxx, Disp: 60 capsule, Rfl: 3 .  hydrALAZINE (APRESOLINE) 50 MG tablet, TAKE 1 TABLET BY MOUTH TWICE DAILY, Disp: 60 tablet, Rfl: 0 .  HYDROcodone-acetaminophen (NORCO) 7.5-325 MG tablet, Take 1 tablet by mouth 3 (three) times daily as needed for severe  pain. For Chronic Pain DNF: 08/24/17, 10/23/17, 11/22/17, Disp: 90 tablet, Rfl: 0 .  ketoconazole (NIZORAL) 2 % cream, U UTD QHS, Disp: , Rfl:  .  levothyroxine (SYNTHROID) 200 MCG tablet, Take 1 tablet (200 mcg total) by mouth daily before breakfast., Disp: 30 tablet, Rfl: 5 .  Multiple Vitamins-Calcium (ONE-A-DAY WOMENS FORMULA PO), Take 1 tablet by mouth daily., Disp: , Rfl:  .  mupirocin cream (BACTROBAN) 2 %, Apply 1 application topically 2 (two) times daily., Disp: 15 g, Rfl: 0 .  omega-3 acid ethyl esters (LOVAZA) 1 g capsule, TAKE 2 CAPSULES BY MOUTH TWICE DAILY, Disp: 120 capsule, Rfl: 0 .  omeprazole (PRILOSEC) 40 MG capsule, Take 1 capsule (40 mg total) by mouth daily., Disp: 30 capsule, Rfl: 5 .  Polyethyl Glycol-Propyl Glycol (SYSTANE OP), Apply to eye., Disp: , Rfl:  .  polyethylene glycol (MIRALAX / GLYCOLAX) packet, Take 17 g by mouth daily., Disp: 14 each, Rfl: 0 .  potassium chloride SA (K-DUR,KLOR-CON) 20 MEQ tablet, Take 1 tablet (20 mEq total) by mouth daily., Disp: 30 tablet, Rfl: 5 .  ranitidine (ZANTAC) 150 MG tablet, Take 1 tablet (150 mg total) by mouth 2 (two) times daily., Disp: 60 tablet, Rfl: 5 .  tiZANidine (ZANAFLEX) 4  MG capsule, Take 1 capsule (4 mg total) by mouth 2 (two) times daily as needed., Disp: 60 capsule, Rfl: 2 .  triamcinolone cream (KENALOG) 0.5 %, Apply topically., Disp: , Rfl:  .  triamterene-hydrochlorothiazide (MAXZIDE) 75-50 MG tablet, Take 1 tablet by mouth daily., Disp: 30 tablet, Rfl: 5 .  trimethoprim-polymyxin b (POLYTRIM) ophthalmic solution, , Disp: , Rfl:  .  VENTOLIN HFA 108 (90 Base) MCG/ACT inhaler, INHALE 2 PUFFS PO Q 4 H PRF WHEEZING OR SOB, Disp: , Rfl: 0 .  doxycycline (VIBRAMYCIN) 100 MG capsule, TK 1 C PO BID, Disp: , Rfl: 0 .  ibuprofen (ADVIL,MOTRIN) 200 MG tablet, Take 400 mg by mouth as needed. otc, Disp: , Rfl:  .  VIRTUSSIN A/C 100-10 MG/5ML syrup, TK 10 ML PO NIGHTLY PRN COU, Disp: , Rfl: 0  ROS  Constitutional: Denies any fever or chills Gastrointestinal: No reported hemesis, hematochezia, vomiting, or acute GI distress Musculoskeletal: Denies any acute onset joint swelling, redness, loss of ROM, or weakness Neurological: No reported episodes of acute onset apraxia, aphasia, dysarthria, agnosia, amnesia, paralysis, loss of coordination, or loss of consciousness  Allergies  Ms. Koch is allergic to ampicillin; penicillins; and vibramycin [doxycycline calcium].  PFSH  Drug: Ms. Frith  reports that she does not use drugs. Alcohol:  reports that she does not drink alcohol. Tobacco:  reports that she quit smoking about 24 years ago. She has never used smokeless tobacco. Medical:  has a past medical history of Anemia, Arthritis, Chronic kidney disease, Family history of adverse reaction to anesthesia, GERD (gastroesophageal reflux disease), H/O tooth extraction, Hemorrhoid, History of hiatal hernia, Hypertension, Migraines, Mixed hyperlipidemia, Multiple gastric ulcers, and Thyroid disease. Surgical: Ms. Blackstock  has a past surgical history that includes Carpal tunnel release; Rotator cuff repair; Ankle surgery; Tubal ligation; Colonoscopy (2000?); Upper gi endoscopy (2000?);  Joint replacement; Tonsillectomy; and Fusion of talonavicular joint (Right, 08/03/2015). Family: family history includes COPD in her sister; Cancer in her maternal aunt and maternal uncle.  Constitutional Exam  General appearance: Well nourished, well developed, and well hydrated. In no apparent acute distress Vitals:   08/06/17 1017  BP: 126/77  Pulse: 72  Resp: 18  Temp: 98.8 F (37.1 C)  TempSrc:  Oral  SpO2: 100%  Weight: 248 lb (112.5 kg)  Height: '5\' 9"'  (1.753 m)   BMI Assessment: Estimated body mass index is 36.62 kg/m as calculated from the following:   Height as of this encounter: '5\' 9"'  (1.753 m).   Weight as of this encounter: 248 lb (112.5 kg).  BMI interpretation table: BMI level Category Range association with higher incidence of chronic pain  <18 kg/m2 Underweight   18.5-24.9 kg/m2 Ideal body weight   25-29.9 kg/m2 Overweight Increased incidence by 20%  30-34.9 kg/m2 Obese (Class I) Increased incidence by 68%  35-39.9 kg/m2 Severe obesity (Class II) Increased incidence by 136%  >40 kg/m2 Extreme obesity (Class III) Increased incidence by 254%   Patient's current BMI Ideal Body weight  Body mass index is 36.62 kg/m. Ideal body weight: 66.2 kg (145 lb 15.1 oz) Adjusted ideal body weight: 84.7 kg (186 lb 12.3 oz)   BMI Readings from Last 4 Encounters:  08/06/17 36.62 kg/m  06/16/17 35.57 kg/m  06/09/17 35.57 kg/m  05/13/17 36.96 kg/m   Wt Readings from Last 4 Encounters:  08/06/17 248 lb (112.5 kg)  06/16/17 255 lb (115.7 kg)  06/09/17 255 lb (115.7 kg)  05/13/17 265 lb (120.2 kg)  Psych/Mental status: Alert, oriented x 3 (person, place, & time)       Eyes: PERLA Respiratory: No evidence of acute respiratory distress  Cervical Spine Area Exam  Skin & Axial Inspection: No masses, redness, edema, swelling, or associated skin lesions Alignment: Symmetrical Functional ROM: Decreased ROM, bilaterally Stability: No instability detected Muscle  Tone/Strength: Functionally intact. No obvious neuro-muscular anomalies detected. Sensory (Neurological): Musculoskeletal pain pattern Palpation: Complains of area being tender to palpation              Upper Extremity (UE) Exam    Side: Right upper extremity  Side: Left upper extremity  Skin & Extremity Inspection: Skin color, temperature, and hair growth are WNL. No peripheral edema or cyanosis. No masses, redness, swelling, asymmetry, or associated skin lesions. No contractures.  Skin & Extremity Inspection: Skin color, temperature, and hair growth are WNL. No peripheral edema or cyanosis. No masses, redness, swelling, asymmetry, or associated skin lesions. No contractures.  Functional ROM: Unrestricted ROM          Functional ROM: Unrestricted ROM          Muscle Tone/Strength: Functionally intact. No obvious neuro-muscular anomalies detected.  Muscle Tone/Strength: Functionally intact. No obvious neuro-muscular anomalies detected.  Sensory (Neurological): Unimpaired          Sensory (Neurological): Unimpaired          Palpation: No palpable anomalies              Palpation: No palpable anomalies              Provocative Test(s):  Phalen's test: deferred Tinel's test: deferred Apley's scratch test (touch opposite shoulder):  Action 1 (Across chest): deferred Action 2 (Overhead): deferred Action 3 (LB reach): deferred   Provocative Test(s):  Phalen's test: deferred Tinel's test: deferred Apley's scratch test (touch opposite shoulder):  Action 1 (Across chest): deferred Action 2 (Overhead): deferred Action 3 (LB reach): deferred    Thoracic Spine Area Exam  Skin & Axial Inspection: No masses, redness, or swelling Alignment: Symmetrical Functional ROM: Unrestricted ROM Stability: No instability detected Muscle Tone/Strength: Functionally intact. No obvious neuro-muscular anomalies detected. Sensory (Neurological): Unimpaired Muscle strength & Tone: No palpable anomalies  Lumbar  Spine Area Exam  Skin & Axial  Inspection: No masses, redness, or swelling Alignment: Symmetrical Functional ROM: Decreased ROM       Stability: No instability detected Muscle Tone/Strength: Functionally intact. No obvious neuro-muscular anomalies detected. Sensory (Neurological): Musculoskeletal pain pattern Palpation: Complains of area being tender to palpation       Provocative Tests: Lumbar Hyperextension/rotation test: (+) bilaterally for facet joint pain. Lumbar quadrant test (Kemp's test): deferred today       Lumbar Lateral bending test: (+) ipsilateral radicular pain, bilaterally. Positive for bilateral foraminal stenosis. Patrick's Maneuver: deferred today                   FABER test: deferred today       Thigh-thrust test: deferred today       S-I compression test: deferred today       S-I distraction test: deferred today        Gait & Posture Assessment  Ambulation: Patient ambulates using a cane Gait: Limited. Using assistive device to ambulate Posture: Difficulty standing up straight, due to pain   Lower Extremity Exam    Side: Right lower extremity  Side: Left lower extremity  Stability: No instability observed          Stability: No instability observed          Skin & Extremity Inspection: Skin color, temperature, and hair growth are WNL. No peripheral edema or cyanosis. No masses, redness, swelling, asymmetry, or associated skin lesions. No contractures.  Skin & Extremity Inspection: Skin color, temperature, and hair growth are WNL. No peripheral edema or cyanosis. No masses, redness, swelling, asymmetry, or associated skin lesions. No contractures.  Functional ROM: Unrestricted ROM                  Functional ROM: Unrestricted ROM                  Muscle Tone/Strength: Functionally intact. No obvious neuro-muscular anomalies detected.  Muscle Tone/Strength: Functionally intact. No obvious neuro-muscular anomalies detected.  Sensory (Neurological): Dermatomal pain  pattern  Sensory (Neurological): Dermatomal pain pattern  Palpation: No palpable anomalies  Palpation: No palpable anomalies   Assessment  Primary Diagnosis & Pertinent Problem List: The primary encounter diagnosis was Lumbar radiculopathy. Diagnoses of Lumbar degenerative disc disease, Chronic pain syndrome, Spinal stenosis, lumbar region, with neurogenic claudication, and SI joint arthritis were also pertinent to this visit.  Status Diagnosis  Having a Flare-up Persistent Persistent 1. Lumbar radiculopathy   2. Lumbar degenerative disc disease   3. Chronic pain syndrome   4. Spinal stenosis, lumbar region, with neurogenic claudication   5. SI joint arthritis     General Recommendations: The pain condition that the patient suffers from is best treated with a multidisciplinary approach that involves an increase in physical activity to prevent de-conditioning and worsening of the pain cycle, as well as psychological counseling (formal and/or informal) to address the co-morbid psychological affects of pain. Treatment will often involve judicious use of pain medications and interventional procedures to decrease the pain, allowing the patient to participate in the physical activity that will ultimately produce long-lasting pain reductions. The goal of the multidisciplinary approach is to return the patient to a higher level of overall function and to restore their ability to perform activities of daily living.  67 year old female with a history of lumbar degenerative disc disease, lumbar radiculopathy was complaining of worsening axial low back pain that radiates into bilateral hips and bilateral lower extremities in a dermatomal fashion.  She describes  burning and tingling at the dorsum of her foot most pronounced in her ankle and at her great toe.  This could be suggestive of the L4-L5 and L5-S1 radiculopathy.  We discussed lumbar epidural steroid injections.  Risks and benefits of the procedure were  discussed and patient would like to proceed.  In regards to her chronic neck and shoulder pain secondary to cervical facet syndrome, cervical spondylosis, we discussed repeating left cervical facet medial branch nerve block as we have done in the past to help out with her neck and shoulder pain.  We will do this after lumbar epidural steroid injection.  We will also refill the patient's chronic pain medications as below which include hydrocodone 7.5 mg 3 times daily as needed as well as gabapentin 300 mg, 2 tablets, 600 mg nightly.  Also encouraged patient to take tizanidine for her neck paraspinal muscle spasms as needed.  Patient has prescription for tizanidine.  Plan of Care  Pharmacotherapy (Medications Ordered): Meds ordered this encounter  Medications  . DISCONTD: HYDROcodone-acetaminophen (NORCO) 7.5-325 MG tablet    Sig: Take 1 tablet by mouth 3 (three) times daily as needed for severe pain. For Chronic Pain DNF: 08/24/17, 10/23/17, 11/22/17    Dispense:  90 tablet    Refill:  0  . DISCONTD: HYDROcodone-acetaminophen (NORCO) 7.5-325 MG tablet    Sig: Take 1 tablet by mouth 3 (three) times daily as needed for severe pain. For Chronic Pain DNF: 08/24/17, 10/23/17, 11/22/17    Dispense:  90 tablet    Refill:  0  . HYDROcodone-acetaminophen (NORCO) 7.5-325 MG tablet    Sig: Take 1 tablet by mouth 3 (three) times daily as needed for severe pain. For Chronic Pain DNF: 08/24/17, 10/23/17, 11/22/17    Dispense:  90 tablet    Refill:  0  . gabapentin (NEURONTIN) 300 MG capsule    Sig: TAKE 2 CAPSULES BY MOUTH NIGHTLY/ Heddy Vidana    Dispense:  60 capsule    Refill:  3   Lab-work, procedure(s), and/or referral(s): Orders Placed This Encounter  Procedures  . Lumbar Epidural Injection   Time Note: Greater than 50% of the 25 minute(s) of face-to-face time spent with Ms. Ulibarri, was spent in counseling/coordination of care regarding: Ms. Patnaude's primary cause of pain, the treatment plan, treatment  alternatives, the risks and possible complications of proposed treatment, medication side effects, going over the informed consent, the opioid analgesic risks and possible complications, the appropriate use of her medications, realistic expectations, the goals of pain management (increased in functionality), the medication agreement and the patient's responsibilities when it comes to controlled substances.  Provider-requested follow-up: Return in about 4 weeks (around 08/31/2017) for Procedure.  Future Appointments  Date Time Provider [place]  08/31/2017  9:45 AM Gillis Santa, MD [facility] None  12/03/2017 10:45 AM Gillis Santa, MD [facility] None    Primary Care Physician: Juline Patch, MD Location: [facility] Note by: Gillis Santa, M.D Date: 08/06/2017; Time: 11:06 AM  Patient Instructions  You have been given 3 prescriptions for Hydrocodone-acetamin. To last until 12/22/2017. Gabapentin sent to your pharmacy.  Schedule Lumbar epidural Injection.   ____________________________________________________________________________________________  Preparing for Procedure with Sedation  Instructions: . Oral Intake: Do not eat or drink anything for at least 8 hours prior to your procedure. . Transportation: Public transportation is not allowed. Bring an adult driver. The driver must be physically present in our waiting room before any procedure can be started. Marland Kitchen Physical Assistance: Bring  an adult physically capable of assisting you, in the event you need help. This adult should keep you company at home for at least 6 hours after the procedure. . Blood Pressure Medicine: Take your blood pressure medicine with a sip of water the morning of the procedure. . Blood thinners:  . Diabetics on insulin: Notify the staff so that you can be scheduled 1st case in the morning. If your diabetes requires high dose insulin, take only  of your normal insulin dose the  morning of the procedure and notify the staff that you have done so. . Preventing infections: Shower with an antibacterial soap the morning of your procedure. . Build-up your immune system: Take 1000 mg of Vitamin C with every meal (3 times a day) the day prior to your procedure. Marland Kitchen Antibiotics: Inform the staff if you have a condition or reason that requires you to take antibiotics before dental procedures. . Pregnancy: If you are pregnant, call and cancel the procedure. . Sickness: If you have a cold, fever, or any active infections, call and cancel the procedure. . Arrival: You must be in the facility at least 30 minutes prior to your scheduled procedure. . Children: Do not bring children with you. . Dress appropriately: Bring dark clothing that you would not mind if they get stained. . Valuables: Do not bring any jewelry or valuables.  Procedure appointments are reserved for interventional treatments only. Marland Kitchen No Prescription Refills. . No medication changes will be discussed during procedure appointments. . No disability issues will be discussed.  Remember:  Regular Business hours are:  Monday to Thursday 8:00 AM to 4:00 PM  Provider's Schedule: Milinda Pointer, MD:  Procedure days: Tuesday and Thursday 7:30 AM to 4:00 PM  Gillis Santa, MD:  Procedure days: Monday and Wednesday 7:30 AM to 4:00 PM ____________________________________________________________________________________________  Epidural Steroid Injection Patient Information  Description: The epidural space surrounds the nerves as they exit the spinal cord.  In some patients, the nerves can be compressed and inflamed by a bulging disc or a tight spinal canal (spinal stenosis).  By injecting steroids into the epidural space, we can bring irritated nerves into direct contact with a potentially helpful medication.  These steroids act directly on the irritated nerves and can reduce swelling and inflammation which often leads  to decreased pain.  Epidural steroids may be injected anywhere along the spine and from the neck to the low back depending upon the location of your pain.   After numbing the skin with local anesthetic (like Novocaine), a small needle is passed into the epidural space slowly.  You may experience a sensation of pressure while this is being done.  The entire block usually last less than 10 minutes.  Conditions which may be treated by epidural steroids:   Low back and leg pain  Neck and arm pain  Spinal stenosis  Post-laminectomy syndrome  Herpes zoster (shingles) pain  Pain from compression fractures  Preparation for the injection:  1. Do not eat any solid food or dairy products within 8 hours of your appointment.  2. You may drink clear liquids up to 3 hours before appointment.  Clear liquids include water, black coffee, juice or soda.  No milk or cream please. 3. You may take your regular medication, including pain medications, with a sip of water before your appointment  Diabetics should hold regular insulin (if taken separately) and take 1/2 normal NPH dos the morning of the procedure.  Carry some sugar containing items  with you to your appointment. 4. A driver must accompany you and be prepared to drive you home after your procedure.  5. Bring all your current medications with your. 6. An IV may be inserted and sedation may be given at the discretion of the physician.   7. A blood pressure cuff, EKG and other monitors will often be applied during the procedure.  Some patients may need to have extra oxygen administered for a short period. 8. You will be asked to provide medical information, including your allergies, prior to the procedure.  We must know immediately if you are taking blood thinners (like Coumadin/Warfarin)  Or if you are allergic to IV iodine contrast (dye). We must know if you could possible be pregnant.  Possible side-effects:  Bleeding from needle site  Infection  (rare, may require surgery)  Nerve injury (rare)  Numbness & tingling (temporary)  Difficulty urinating (rare, temporary)  Spinal headache ( a headache worse with upright posture)  Light -headedness (temporary)  Pain at injection site (several days)  Decreased blood pressure (temporary)  Weakness in arm/leg (temporary)  Pressure sensation in back/neck (temporary)  Call if you experience:  Fever/chills associated with headache or increased back/neck pain.  Headache worsened by an upright position.  New onset weakness or numbness of an extremity below the injection site  Hives or difficulty breathing (go to the emergency room)  Inflammation or drainage at the infection site  Severe back/neck pain  Any new symptoms which are concerning to you  Please note:  Although the local anesthetic injected can often make your back or neck feel good for several hours after the injection, the pain will likely return.  It takes 3-7 days for steroids to work in the epidural space.  You may not notice any pain relief for at least that one week.  If effective, we will often do a series of three injections spaced 3-6 weeks apart to maximally decrease your pain.  After the initial series, we generally will wait several months before considering a repeat injection of the same type.  If you have any questions, please call [phone] [facility] ____________________________________________________________________________________________

## 2017-08-07 DIAGNOSIS — Z96651 Presence of right artificial knee joint: Secondary | ICD-10-CM | POA: Diagnosis not present

## 2017-08-18 ENCOUNTER — Encounter: Payer: Self-pay | Admitting: Family Medicine

## 2017-08-18 ENCOUNTER — Ambulatory Visit (INDEPENDENT_AMBULATORY_CARE_PROVIDER_SITE_OTHER): Payer: Medicare Other | Admitting: Family Medicine

## 2017-08-18 VITALS — BP 122/70 | HR 76 | Ht 69.0 in | Wt 241.0 lb

## 2017-08-18 DIAGNOSIS — Z6835 Body mass index (BMI) 35.0-35.9, adult: Secondary | ICD-10-CM

## 2017-08-18 DIAGNOSIS — R1030 Lower abdominal pain, unspecified: Secondary | ICD-10-CM | POA: Diagnosis not present

## 2017-08-18 DIAGNOSIS — N9489 Other specified conditions associated with female genital organs and menstrual cycle: Secondary | ICD-10-CM | POA: Diagnosis not present

## 2017-08-18 DIAGNOSIS — R195 Other fecal abnormalities: Secondary | ICD-10-CM

## 2017-08-18 DIAGNOSIS — B3731 Acute candidiasis of vulva and vagina: Secondary | ICD-10-CM

## 2017-08-18 DIAGNOSIS — Z124 Encounter for screening for malignant neoplasm of cervix: Secondary | ICD-10-CM | POA: Diagnosis not present

## 2017-08-18 DIAGNOSIS — N898 Other specified noninflammatory disorders of vagina: Secondary | ICD-10-CM | POA: Diagnosis not present

## 2017-08-18 DIAGNOSIS — B373 Candidiasis of vulva and vagina: Secondary | ICD-10-CM | POA: Diagnosis not present

## 2017-08-18 LAB — HEMOCCULT GUIAC POC 1CARD (OFFICE): Fecal Occult Blood, POC: POSITIVE — AB

## 2017-08-18 LAB — POCT URINALYSIS DIPSTICK
Bilirubin, UA: NEGATIVE
Blood, UA: NEGATIVE
Glucose, UA: NEGATIVE
Ketones, UA: NEGATIVE
LEUKOCYTES UA: NEGATIVE
NITRITE UA: NEGATIVE
PROTEIN UA: NEGATIVE
SPEC GRAV UA: 1.01 (ref 1.010–1.025)
Urobilinogen, UA: 0.2 E.U./dL
pH, UA: 5 (ref 5.0–8.0)

## 2017-08-18 MED ORDER — SULFAMETHOXAZOLE-TRIMETHOPRIM 800-160 MG PO TABS: 1.0000 | ORAL_TABLET | Freq: Two times a day (BID) | ORAL | 0 refills | Status: DC

## 2017-08-18 MED ORDER — FLUCONAZOLE 150 MG PO TABS: 150.0000 mg | ORAL_TABLET | Freq: Once | ORAL | 0 refills | Status: AC

## 2017-08-18 MED ORDER — NYSTATIN 100000 UNIT/GM EX CREA: 1.0000 "application " | TOPICAL_CREAM | Freq: Two times a day (BID) | CUTANEOUS | 0 refills | Status: DC

## 2017-08-18 MED ORDER — KETOCONAZOLE 2 % EX CREA: TOPICAL_CREAM | CUTANEOUS | 0 refills | Status: DC

## 2017-08-18 NOTE — Assessment & Plan Note (Signed)
Health risks of being over weight were discussed and patient was counseled on weight loss options and exercise.

## 2017-08-18 NOTE — Progress Notes (Signed)
Name: Michele Moore   MRN: [id number]    DOB: January 03, 1951   Date:08/18/2017       Progress Note  Subjective  Chief Complaint  Chief Complaint  Patient presents with  . vaginal discomfort    feels a "bump on Left side of vagina"- using yeast cream- not helping    Gynecologic Exam  The patient's pertinent negatives include no genital itching, genital lesions, genital odor, genital rash, missed menses, pelvic pain, vaginal bleeding or vaginal discharge. Primary symptoms comment: "bump" left labia. This is a new problem. The current episode started 1 to 4 weeks ago (2 weeks). The problem occurs daily. The problem has been gradually worsening. The pain is moderate (burning). The problem affects the left side. She is not pregnant. Associated symptoms include abdominal pain, dysuria, frequency and urgency. Pertinent negatives include no anorexia, back pain, chills, constipation, diarrhea, discolored urine, fever, flank pain, headaches, hematuria, joint pain, joint swelling, nausea, painful intercourse, rash, sore throat or vomiting. Associated symptoms comments: ?drainage/suprapubic pain. The vaginal discharge was brown. The vaginal bleeding is lighter than menses. She has not been passing clots. She has not been passing tissue. The symptoms are aggravated by activity. Treatments tried: topical fluconazole. The treatment provided mild relief. She is not sexually active. She is postmenopausal (age 67 "change of life"). There is no history of an abdominal surgery, a gynecological surgery or perineal abscess.    Severe obesity (BMI 35.0-35.9 with comorbidity) ([place]) Health risks of being over weight were discussed and patient was counseled on weight loss options and exercise.   Past Medical History:  Diagnosis Date  . Anemia   . Arthritis   . Chronic kidney disease    STAGE 3 PER DR EASON 08/02/15  . Family history of adverse reaction to anesthesia    sister difficult to put to sleep  . GERD  (gastroesophageal reflux disease)   . H/O tooth extraction    all lower teeth 1/19  . Hemorrhoid   . History of hiatal hernia   . Hypertension   . Migraines   . Mixed hyperlipidemia   . Multiple gastric ulcers   . Thyroid disease     Past Surgical History:  Procedure Laterality Date  . ANKLE SURGERY    . CARPAL TUNNEL RELEASE     x3  . COLONOSCOPY  2000?  . FUSION OF TALONAVICULAR JOINT Right 08/03/2015   Procedure: TAILOR NAVICULAR JOINT FUSION - RIGHT ;  Surgeon: [name], DPM;  Location: [facility] ORS;  Service: Podiatry;  Laterality: Right;  . JOINT REPLACEMENT     knee x 3 ,right x1 and left x2  . ROTATOR CUFF REPAIR    . TONSILLECTOMY    . TUBAL LIGATION    . UPPER GI ENDOSCOPY  2000?    Family History  Problem Relation Age of Onset  . COPD Sister   . Cancer Maternal Aunt        breast  . Cancer Maternal Uncle        kidney    Social History   Socioeconomic History  . Marital status: Divorced    Spouse name: Not on file  . Number of children: Not on file  . Years of education: Not on file  . Highest education level: Not on file  Occupational History  . Not on file  Social Needs  . Financial resource strain: Not on file  . Food insecurity:    Worry: Not on file    Inability: Not  on file  . Transportation needs:    Medical: Not on file    Non-medical: Not on file  Tobacco Use  . Smoking status: Former Smoker    Last attempt to quit: 02/24/1993    Years since quitting: 24.4  . Smokeless tobacco: Never Used  Substance and Sexual Activity  . Alcohol use: No    Alcohol/week: 0.0 oz  . Drug use: No  . Sexual activity: Not on file  Lifestyle  . Physical activity:    Days per week: Not on file    Minutes per session: Not on file  . Stress: Not on file  Relationships  . Social connections:    Talks on phone: Not on file    Gets together: Not on file    Attends religious service: Not on file    Active member of club or organization: Not on file     Attends meetings of clubs or organizations: Not on file    Relationship status: Not on file  . Intimate partner violence:    Fear of current or ex partner: Not on file    Emotionally abused: Not on file    Physically abused: Not on file    Forced sexual activity: Not on file  Other Topics Concern  . Not on file  Social History Narrative  . Not on file    Allergies  Allergen Reactions  . Ampicillin Swelling  . Penicillins Anaphylaxis  . Vibramycin [Doxycycline Calcium] Rash    Outpatient Medications Prior to Visit  Medication Sig Dispense Refill  . allopurinol (ZYLOPRIM) 100 MG tablet Take 2 tablets by mouth daily. Dr Barb Merino    . aspirin 81 MG tablet Take 81 mg by mouth daily.    . Cholecalciferol (VITAMIN D3) 2000 units capsule Take 2,000 Units by mouth daily.    . diclofenac sodium (VOLTAREN) 1 % GEL [facility]    . ferrous sulfate 325 (65 FE) MG tablet Take 325 mg by mouth daily with breakfast. otc    . fexofenadine-pseudoephedrine (ALLEGRA-D 24) 180-240 MG 24 hr tablet Take by mouth.    . fluticasone (FLONASE) 50 MCG/ACT nasal spray Place 1 spray into both nostrils daily. 16 g 2  . gabapentin (NEURONTIN) 300 MG capsule TAKE 2 CAPSULES BY MOUTH NIGHTLY/ [place] 60 capsule 3  . hydrALAZINE (APRESOLINE) 50 MG tablet TAKE 1 TABLET BY MOUTH TWICE DAILY 60 tablet 0  . HYDROcodone-acetaminophen (NORCO) 7.5-325 MG tablet Take 1 tablet by mouth 3 (three) times daily as needed for severe pain. For Chronic Pain DNF: 08/24/17, 10/23/17, 11/22/17 90 tablet 0  . levothyroxine (SYNTHROID) 200 MCG tablet Take 1 tablet (200 mcg total) by mouth daily before breakfast. 30 tablet 5  . Multiple Vitamins-Calcium (ONE-A-DAY WOMENS FORMULA PO) Take 1 tablet by mouth daily.    . mupirocin cream (BACTROBAN) 2 % Apply 1 application topically 2 (two) times daily. 15 g 0  . omega-3 acid ethyl esters (LOVAZA) 1 g capsule TAKE 2 CAPSULES BY MOUTH TWICE DAILY 120 capsule 0  . omeprazole (PRILOSEC) 40  MG capsule Take 1 capsule (40 mg total) by mouth daily. 30 capsule 5  . Polyethyl Glycol-Propyl Glycol (SYSTANE OP) Apply to eye.    . polyethylene glycol (MIRALAX / GLYCOLAX) packet Take 17 g by mouth daily. 14 each 0  . potassium chloride SA (K-DUR,KLOR-CON) 20 MEQ tablet Take 1 tablet (20 mEq total) by mouth daily. 30 tablet 5  . ranitidine (ZANTAC) 150 MG tablet Take 1  tablet (150 mg total) by mouth 2 (two) times daily. 60 tablet 5  . tiZANidine (ZANAFLEX) 4 MG capsule Take 1 capsule (4 mg total) by mouth 2 (two) times daily as needed. 60 capsule 2  . triamcinolone cream (KENALOG) 0.5 % Apply topically.    . triamterene-hydrochlorothiazide (MAXZIDE) 75-50 MG tablet Take 1 tablet by mouth daily. 30 tablet 5  . trimethoprim-polymyxin b (POLYTRIM) ophthalmic solution     . VENTOLIN HFA 108 (90 Base) MCG/ACT inhaler INHALE 2 PUFFS PO Q 4 H PRF WHEEZING OR SOB  0  . ibuprofen (ADVIL,MOTRIN) 200 MG tablet Take 400 mg by mouth as needed. otc    . ketoconazole (NIZORAL) 2 % cream U UTD QHS    . doxycycline (VIBRAMYCIN) 100 MG capsule TK 1 C PO BID  0  . VIRTUSSIN A/C 100-10 MG/5ML syrup TK 10 ML PO NIGHTLY PRN COU  0   No facility-administered medications prior to visit.     Review of Systems  Constitutional: Negative for chills, fever, malaise/fatigue and weight loss.  HENT: Negative for ear discharge, ear pain and sore throat.   Eyes: Negative for blurred vision.  Respiratory: Negative for cough, sputum production, shortness of breath and wheezing.   Cardiovascular: Negative for chest pain, palpitations and leg swelling.  Gastrointestinal: Positive for abdominal pain. Negative for anorexia, blood in stool, constipation, diarrhea, heartburn, melena, nausea and vomiting.  Genitourinary: Positive for dysuria, frequency and urgency. Negative for flank pain, hematuria, missed menses, pelvic pain and vaginal discharge.  Musculoskeletal: Negative for back pain, joint pain, myalgias and neck pain.    Skin: Negative for rash.  Neurological: Negative for dizziness, tingling, sensory change, focal weakness and headaches.  Endo/Heme/Allergies: Negative for environmental allergies and polydipsia. Does not bruise/bleed easily.  Psychiatric/Behavioral: Negative for depression and suicidal ideas. The patient is not nervous/anxious and does not have insomnia.      Objective  Vitals:   08/18/17 0907  BP: 122/70  Pulse: 76  Weight: 241 lb (109.3 kg)  Height: 5\' 9"  (1.753 m)    Physical Exam  Constitutional: No distress.  HENT:  Head: Normocephalic and atraumatic.  Right Ear: External ear normal.  Left Ear: External ear normal.  Nose: Nose normal.  Mouth/Throat: Oropharynx is clear and moist.  Eyes: Pupils are equal, round, and reactive to light. Conjunctivae and EOM are normal. Right eye exhibits no discharge. Left eye exhibits no discharge.  Neck: Normal range of motion. Neck supple. No JVD present. No thyromegaly present.  Cardiovascular: Normal rate, regular rhythm, normal heart sounds and intact distal pulses. Exam reveals no gallop and no friction rub.  No murmur heard. Pulmonary/Chest: Effort normal and breath sounds normal.  Abdominal: Soft. Bowel sounds are normal. She exhibits no mass. There is no tenderness. There is no guarding.  Genitourinary: Rectum normal and vagina normal.    Pelvic exam was performed with patient supine. There is no rash, tenderness, lesion or injury on the right labia. There is tenderness and lesion on the left labia. There is no rash or injury on the left labia. Uterus is tender. Uterus is not enlarged. Cervix exhibits no motion tenderness, no discharge and no friability. Right adnexum displays no mass, no tenderness and no fullness. Left adnexum displays no mass, no tenderness and no fullness.  Musculoskeletal: Normal range of motion. She exhibits no edema.  Lymphadenopathy:    She has no cervical adenopathy.  Neurological: She is alert. She has  normal reflexes.  Skin: Skin is warm  and dry. She is not diaphoretic.  Nursing note and vitals reviewed.     Assessment & Plan  Problem List Items Addressed This Visit      Other   Severe obesity (BMI 35.0-35.9 with comorbidity) ([place])    Health risks of being over weight were discussed and patient was counseled on weight loss options and exercise.       Other Visit Diagnoses    Lower abdominal pain    -  Primary   Intermitant for years, worse last 2 weeks. Will do PAP and refer to gyv for evalof labial tenderness and pelvic tenderness.   Relevant Orders   Ambulatory referral to Gynecology   POCT occult blood stool (Completed)   POCT urinalysis dipstick (Completed)   Vaginal discharge       Brownish discharge less than a period, no discharge today per cervical os. refer to gyn to rule out postmenopausal bleeding.   Relevant Orders   Ambulatory referral to Gynecology   Pap IG (Image Guided)   Guaiac positive stools       Abdominal pain with guaiac positive stool on rectal exam. Referral to gastroenterology   Relevant Orders   Ambulatory referral to Gastroenterology   POCT occult blood stool (Completed)   Cervical cancer screening       Pap done   Relevant Orders   Pap IG (Image Guided)   Yeast infection involving the vagina and surrounding area       perivaginal erythema will treat with diflucan and nystatin cream.   Relevant Medications   ketoconazole (NIZORAL) 2 % cream   fluconazole (DIFLUCAN) 150 MG tablet   nystatin cream (MYCOSTATIN)   sulfamethoxazole-trimethoprim (BACTRIM DS,SEPTRA DS) 800-160 MG tablet   Labial pain       Pain and tenderness of left inferior labia,treat with septra ds and followup with gyn      Meds ordered this encounter  Medications  . ketoconazole (NIZORAL) 2 % cream    Sig: U UTD QHS    Dispense:  30 g    Refill:  0  . fluconazole (DIFLUCAN) 150 MG tablet    Sig: Take 1 tablet (150 mg total) by mouth once for 1 dose.    Dispense:   1 tablet    Refill:  0  . nystatin cream (MYCOSTATIN)    Sig: Apply 1 application topically 2 (two) times daily.    Dispense:  30 g    Refill:  0  . sulfamethoxazole-trimethoprim (BACTRIM DS,SEPTRA DS) 800-160 MG tablet    Sig: Take 1 tablet by mouth 2 (two) times daily.    Dispense:  14 tablet    Refill:  0      Dr. Otilio Miu [facility]  08/18/17

## 2017-08-20 ENCOUNTER — Other Ambulatory Visit: Payer: Self-pay

## 2017-08-22 LAB — PAP IG (IMAGE GUIDED): PAP Smear Comment: 0

## 2017-08-24 ENCOUNTER — Other Ambulatory Visit: Payer: Self-pay

## 2017-08-24 ENCOUNTER — Encounter: Payer: Self-pay | Admitting: Neurology

## 2017-08-24 ENCOUNTER — Observation Stay
Admission: EM | Admit: 2017-08-24 | Discharge: 2017-08-25 | Disposition: A | Discharge status: Home / Self Care | Payer: Medicare Other | Attending: Internal Medicine | Admitting: Internal Medicine

## 2017-08-24 DIAGNOSIS — H04123 Dry eye syndrome of bilateral lacrimal glands: Secondary | ICD-10-CM | POA: Diagnosis not present

## 2017-08-24 DIAGNOSIS — R008 Other abnormalities of heart beat: Secondary | ICD-10-CM | POA: Diagnosis not present

## 2017-08-24 DIAGNOSIS — N3281 Overactive bladder: Secondary | ICD-10-CM | POA: Diagnosis not present

## 2017-08-24 DIAGNOSIS — Z880 Allergy status to penicillin: Secondary | ICD-10-CM | POA: Diagnosis not present

## 2017-08-24 DIAGNOSIS — I129 Hypertensive chronic kidney disease with stage 1 through stage 4 chronic kidney disease, or unspecified chronic kidney disease: Secondary | ICD-10-CM | POA: Insufficient documentation

## 2017-08-24 DIAGNOSIS — N179 Acute kidney failure, unspecified: Secondary | ICD-10-CM | POA: Insufficient documentation

## 2017-08-24 DIAGNOSIS — R5383 Other fatigue: Secondary | ICD-10-CM | POA: Diagnosis not present

## 2017-08-24 DIAGNOSIS — R102 Pelvic and perineal pain: Secondary | ICD-10-CM | POA: Diagnosis not present

## 2017-08-24 DIAGNOSIS — Z881 Allergy status to other antibiotic agents status: Secondary | ICD-10-CM | POA: Diagnosis not present

## 2017-08-24 DIAGNOSIS — R42 Dizziness and giddiness: Secondary | ICD-10-CM | POA: Diagnosis not present

## 2017-08-24 DIAGNOSIS — N289 Disorder of kidney and ureter, unspecified: Secondary | ICD-10-CM

## 2017-08-24 DIAGNOSIS — M1990 Unspecified osteoarthritis, unspecified site: Secondary | ICD-10-CM | POA: Insufficient documentation

## 2017-08-24 DIAGNOSIS — Z87891 Personal history of nicotine dependence: Secondary | ICD-10-CM | POA: Diagnosis not present

## 2017-08-24 DIAGNOSIS — I493 Ventricular premature depolarization: Secondary | ICD-10-CM | POA: Diagnosis not present

## 2017-08-24 DIAGNOSIS — E782 Mixed hyperlipidemia: Secondary | ICD-10-CM | POA: Insufficient documentation

## 2017-08-24 DIAGNOSIS — N183 Chronic kidney disease, stage 3 (moderate): Secondary | ICD-10-CM | POA: Insufficient documentation

## 2017-08-24 DIAGNOSIS — K219 Gastro-esophageal reflux disease without esophagitis: Secondary | ICD-10-CM | POA: Diagnosis not present

## 2017-08-24 DIAGNOSIS — I499 Cardiac arrhythmia, unspecified: Secondary | ICD-10-CM | POA: Diagnosis not present

## 2017-08-24 DIAGNOSIS — R001 Bradycardia, unspecified: Principal | ICD-10-CM | POA: Diagnosis present

## 2017-08-24 DIAGNOSIS — E039 Hypothyroidism, unspecified: Secondary | ICD-10-CM | POA: Diagnosis not present

## 2017-08-24 DIAGNOSIS — Z79899 Other long term (current) drug therapy: Secondary | ICD-10-CM | POA: Diagnosis not present

## 2017-08-24 DIAGNOSIS — Z7982 Long term (current) use of aspirin: Secondary | ICD-10-CM | POA: Insufficient documentation

## 2017-08-24 DIAGNOSIS — N189 Chronic kidney disease, unspecified: Secondary | ICD-10-CM

## 2017-08-24 DIAGNOSIS — K5901 Slow transit constipation: Secondary | ICD-10-CM | POA: Diagnosis not present

## 2017-08-24 DIAGNOSIS — I10 Essential (primary) hypertension: Secondary | ICD-10-CM | POA: Diagnosis not present

## 2017-08-24 DIAGNOSIS — I498 Other specified cardiac arrhythmias: Secondary | ICD-10-CM

## 2017-08-24 DIAGNOSIS — R079 Chest pain, unspecified: Secondary | ICD-10-CM | POA: Diagnosis not present

## 2017-08-24 LAB — URINALYSIS, COMPLETE (UACMP) WITH MICROSCOPIC
BACTERIA UA: NONE SEEN
Bilirubin Urine: NEGATIVE
Glucose, UA: NEGATIVE mg/dL
Hgb urine dipstick: NEGATIVE
Ketones, ur: NEGATIVE mg/dL
Leukocytes, UA: NEGATIVE
Nitrite: NEGATIVE
Protein, ur: NEGATIVE mg/dL
SPECIFIC GRAVITY, URINE: 1.016 (ref 1.005–1.030)
pH: 7 (ref 5.0–8.0)

## 2017-08-24 LAB — BASIC METABOLIC PANEL
ANION GAP: 9 (ref 5–15)
BUN: 39 mg/dL — ABNORMAL HIGH (ref 8–23)
CO2: 26 mmol/L (ref 22–32)
Calcium: 9.7 mg/dL (ref 8.9–10.3)
Chloride: 107 mmol/L (ref 98–111)
Creatinine, Ser: 2.79 mg/dL — ABNORMAL HIGH (ref 0.44–1.00)
GFR calc Af Amer: 19 mL/min — ABNORMAL LOW (ref >60)
GFR calc non Af Amer: 16 mL/min — ABNORMAL LOW (ref >60)
GLUCOSE: 100 mg/dL — AB (ref 70–99)
POTASSIUM: 4.4 mmol/L (ref 3.5–5.1)
Sodium: 142 mmol/L (ref 135–145)

## 2017-08-24 LAB — CBC
HEMATOCRIT: 34.2 % — AB (ref 35.0–47.0)
HEMOGLOBIN: 11.4 g/dL — AB (ref 12.0–16.0)
MCH: 28.8 pg (ref 26.0–34.0)
MCHC: 33.4 g/dL (ref 32.0–36.0)
MCV: 86.1 fL (ref 80.0–100.0)
Platelets: 173 10*3/uL (ref 150–440)
RBC: 3.97 MIL/uL (ref 3.80–5.20)
RDW: 14.6 % — ABNORMAL HIGH (ref 11.5–14.5)
WBC: 5.6 10*3/uL (ref 3.6–11.0)

## 2017-08-24 MED ORDER — FERROUS SULFATE 325 (65 FE) MG PO TABS
325.0000 mg | ORAL_TABLET | Freq: Every day | ORAL | Status: DC
  Administered 2017-08-25: 325 mg via ORAL
  Filled 2017-08-24: qty 1

## 2017-08-24 MED ORDER — ALLOPURINOL 100 MG PO TABS
200.0000 mg | ORAL_TABLET | Freq: Every day | ORAL | Status: DC
  Administered 2017-08-25: 200 mg via ORAL
  Filled 2017-08-24: qty 2

## 2017-08-24 MED ORDER — HYDRALAZINE HCL 50 MG PO TABS
50.0000 mg | ORAL_TABLET | Freq: Two times a day (BID) | ORAL | Status: DC
  Administered 2017-08-24 – 2017-08-25 (×2): 50 mg via ORAL
  Filled 2017-08-24 (×2): qty 1

## 2017-08-24 MED ORDER — ONDANSETRON HCL 4 MG/2ML IJ SOLN: 4.0000 mg | Freq: Four times a day (QID) | INTRAMUSCULAR | Status: DC | PRN

## 2017-08-24 MED ORDER — POLYVINYL ALCOHOL 1.4 % OP SOLN
1.0000 [drp] | Freq: Every day | OPHTHALMIC | Status: DC | PRN
  Filled 2017-08-24: qty 15

## 2017-08-24 MED ORDER — FLUTICASONE PROPIONATE 50 MCG/ACT NA SUSP
1.0000 | Freq: Every day | NASAL | Status: DC
  Administered 2017-08-25: 1 via NASAL
  Filled 2017-08-24: qty 16

## 2017-08-24 MED ORDER — SODIUM CHLORIDE 0.9 % IV BOLUS
500.0000 mL | Freq: Once | INTRAVENOUS | Status: AC
  Administered 2017-08-24: 500 mL via INTRAVENOUS

## 2017-08-24 MED ORDER — OMEGA-3-ACID ETHYL ESTERS 1 G PO CAPS
2.0000 | ORAL_CAPSULE | Freq: Two times a day (BID) | ORAL | Status: DC
  Administered 2017-08-24 – 2017-08-25 (×2): 2 g via ORAL
  Filled 2017-08-24 (×2): qty 2

## 2017-08-24 MED ORDER — HYDROCODONE-ACETAMINOPHEN 7.5-325 MG PO TABS
1.0000 | ORAL_TABLET | Freq: Three times a day (TID) | ORAL | Status: DC | PRN
  Administered 2017-08-25 (×2): 1 via ORAL
  Filled 2017-08-24 (×2): qty 1

## 2017-08-24 MED ORDER — ACETAMINOPHEN 325 MG PO TABS: 650.0000 mg | ORAL_TABLET | Freq: Four times a day (QID) | ORAL | Status: DC | PRN

## 2017-08-24 MED ORDER — ASPIRIN EC 81 MG PO TBEC
81.0000 mg | DELAYED_RELEASE_TABLET | Freq: Every day | ORAL | Status: DC
  Administered 2017-08-25: 81 mg via ORAL
  Filled 2017-08-24: qty 1

## 2017-08-24 MED ORDER — ALBUTEROL SULFATE (2.5 MG/3ML) 0.083% IN NEBU: 3.0000 mL | INHALATION_SOLUTION | RESPIRATORY_TRACT | Status: DC | PRN

## 2017-08-24 MED ORDER — VITAMIN D3 25 MCG (1000 UNIT) PO TABS
2000.0000 [IU] | ORAL_TABLET | Freq: Every day | ORAL | Status: DC
  Administered 2017-08-25: 2000 [IU] via ORAL
  Filled 2017-08-24: qty 2

## 2017-08-24 MED ORDER — PANTOPRAZOLE SODIUM 40 MG PO TBEC
40.0000 mg | DELAYED_RELEASE_TABLET | Freq: Every day | ORAL | Status: DC
  Administered 2017-08-25: 40 mg via ORAL
  Filled 2017-08-24: qty 1

## 2017-08-24 MED ORDER — GABAPENTIN 300 MG PO CAPS
600.0000 mg | ORAL_CAPSULE | Freq: Every day | ORAL | Status: DC
  Administered 2017-08-24: 600 mg via ORAL
  Filled 2017-08-24: qty 2

## 2017-08-24 MED ORDER — SODIUM CHLORIDE 0.9 % IV SOLN
INTRAVENOUS | Status: DC
  Administered 2017-08-24: 22:00:00 via INTRAVENOUS

## 2017-08-24 MED ORDER — ONDANSETRON HCL 4 MG PO TABS: 4.0000 mg | ORAL_TABLET | Freq: Four times a day (QID) | ORAL | Status: DC | PRN

## 2017-08-24 MED ORDER — NYSTATIN 100000 UNIT/GM EX CREA
1.0000 "application " | TOPICAL_CREAM | Freq: Two times a day (BID) | CUTANEOUS | Status: DC
  Administered 2017-08-24: 1 via TOPICAL
  Filled 2017-08-24: qty 15

## 2017-08-24 MED ORDER — HEPARIN SODIUM (PORCINE) 5000 UNIT/ML IJ SOLN
5000.0000 [IU] | Freq: Three times a day (TID) | INTRAMUSCULAR | Status: DC
  Administered 2017-08-24 – 2017-08-25 (×3): 5000 [IU] via SUBCUTANEOUS
  Filled 2017-08-24 (×3): qty 1

## 2017-08-24 MED ORDER — ACETAMINOPHEN 650 MG RE SUPP: 650.0000 mg | Freq: Four times a day (QID) | RECTAL | Status: DC | PRN

## 2017-08-24 NOTE — Progress Notes (Signed)
Advanced care plan. Purpose of the Encounter: CODE STATUS Parties in Attendance: Patient Patient's Decision Capacity: Good Subjective/Patient's story: Presented to the emergency room for low heart rate and about to pass out Objective/Medical story Has bradycardia and renal failure Goals of care determination:  Advance care directives and goals of care were discussed in detail Patient wants everything done which includes CPR and intubation of the need arises  CODE STATUS: full code Time spent discussing advanced care planning: 16 minutes

## 2017-08-24 NOTE — ED Triage Notes (Signed)
Pt sent here by MD for low, irregular heart rate. Pt denies chest pain.

## 2017-08-24 NOTE — ED Notes (Signed)
Pt to the er after being seen at her obgyn and the urgent care. While getting a physical it was noted that pt heart rate dropped into the 30s. PT has a hx of a heart murmur from childhood but currently has no cardiologist. Pt shows a ventricular bigeminy on the monitor and PVCs. Pt reports feeling dizzy and weak apon standing. Symptoms began 2 weeks ago when pt began taking Bactrim.

## 2017-08-24 NOTE — H&P (Signed)
[facility] NAME: Michele Moore    MR#:  [id number]  DATE OF BIRTH:  Jul 23, 1950  DATE OF ADMISSION:  08/24/2017  PRIMARY CARE PHYSICIAN: Juline Patch, MD   REQUESTING/REFERRING PHYSICIAN:   CHIEF COMPLAINT:   Chief Complaint  Patient presents with  . Fatigue    HISTORY OF PRESENT ILLNESS: Michele Moore  is a 67 y.o. female with a known history of chronic kidney disease stage III, GERD, hiatal hernia, hypertension, hyperlipidemia, hypothyroidism presented to the emergency room with generalized weakness and fatigue.She went to doctor's office for checkup and they found her pulse was low.  Patient had bradycardia around 40 bpm, which she was referred to the emergency room.  Patient recently finished a course of Bactrim antibiotic.  She also used a nystatin cream for vaginal yeast infection.  Patient says she feels tired and was about to pass out.  Pulse rate has improved after presentation to the emergency room and bradycardia is resolved.  During the work-up in the emergency room her kidney functions have worsened and creatinine is around 2.7.  PAST MEDICAL HISTORY:   Past Medical History:  Diagnosis Date  . Anemia   . Arthritis   . Chronic kidney disease    STAGE 3 PER DR EASON 08/02/15  . Family history of adverse reaction to anesthesia    sister difficult to put to sleep  . GERD (gastroesophageal reflux disease)   . H/O tooth extraction    all lower teeth 1/19  . Hemorrhoid   . History of hiatal hernia   . Hypertension   . Migraines   . Mixed hyperlipidemia   . Multiple gastric ulcers   . Thyroid disease     PAST SURGICAL HISTORY:  Past Surgical History:  Procedure Laterality Date  . ANKLE SURGERY    . CARPAL TUNNEL RELEASE     x3  . COLONOSCOPY  2000?  . FUSION OF TALONAVICULAR JOINT Right 08/03/2015   Procedure: TAILOR NAVICULAR JOINT FUSION - RIGHT ;  Surgeon: [name], DPM;  Location: [facility] ORS;  Service:  Podiatry;  Laterality: Right;  . JOINT REPLACEMENT     knee x 3 ,right x1 and left x2  . ROTATOR CUFF REPAIR    . TONSILLECTOMY    . TUBAL LIGATION    . UPPER GI ENDOSCOPY  2000?    SOCIAL HISTORY:  Social History   Tobacco Use  . Smoking status: Former Smoker    Last attempt to quit: 02/24/1993    Years since quitting: 24.5  . Smokeless tobacco: Never Used  Substance Use Topics  . Alcohol use: No    Alcohol/week: 0.0 oz    FAMILY HISTORY:  Family History  Problem Relation Age of Onset  . COPD Sister   . Cancer Maternal Aunt        breast  . Cancer Maternal Uncle        kidney    DRUG ALLERGIES:  Allergies  Allergen Reactions  . Penicillins Anaphylaxis and Swelling    Has patient had a PCN reaction causing immediate rash, facial/tongue/throat swelling, SOB or lightheadedness with hypotension: Yes Has patient had a PCN reaction causing severe rash involving mucus membranes or skin necrosis: No Has patient had a PCN reaction that required hospitalization: No Has patient had a PCN reaction occurring within the last 10 years: Yes If all of the above answers are "NO", then may proceed with Cephalosporin use.  Marland Kitchen  Vibramycin [Doxycycline Calcium] Rash    REVIEW OF SYSTEMS:   CONSTITUTIONAL: No fever,has  fatigue and weakness.  EYES: No blurred or double vision.  EARS, NOSE, AND THROAT: No tinnitus or ear pain.  RESPIRATORY: No cough, shortness of breath, wheezing or hemoptysis.  CARDIOVASCULAR: No chest pain, orthopnea, edema.  GASTROINTESTINAL: No nausea, vomiting, diarrhea or abdominal pain.  GENITOURINARY: No dysuria, hematuria.  ENDOCRINE: No polyuria, nocturia,  HEMATOLOGY: No anemia, easy bruising or bleeding SKIN: No rash or lesion. MUSCULOSKELETAL: No joint pain or arthritis.   NEUROLOGIC: No tingling, numbness, weakness.  PSYCHIATRY: No anxiety or depression.   MEDICATIONS AT HOME:  Prior to Admission medications   Medication Sig Start Date End Date  Taking? Authorizing Provider  albuterol (PROVENTIL HFA;VENTOLIN HFA) 108 (90 Base) MCG/ACT inhaler Inhale 2 puffs into the lungs every 4 (four) hours as needed for wheezing.   Yes [provider]  allopurinol (ZYLOPRIM) 100 MG tablet Take 2 tablets by mouth daily. Dr Barb Merino 08/06/17  Yes [provider]  aspirin EC 81 MG tablet Take 81 mg by mouth daily.   Yes [provider]  Cholecalciferol (VITAMIN D3) 2000 units TABS Take 2,000 Units by mouth daily.   Yes [provider]  diclofenac sodium (VOLTAREN) 1 % GEL Apply 2 g topically 3 (three) times daily. [facility] 08/11/17  Yes [provider]  ferrous sulfate 325 (65 FE) MG tablet Take 325 mg by mouth daily with breakfast. otc   Yes [provider]  fexofenadine-pseudoephedrine (ALLEGRA-D 24) 180-240 MG 24 hr tablet Take 1 tablet by mouth daily as needed (allergies/sinus pressure).    Yes [provider]  fluticasone (FLONASE) 50 MCG/ACT nasal spray Place 1 spray into both nostrils daily. 06/09/17 06/09/18 Yes Fisher, Linden Dolin, PA-C  gabapentin (NEURONTIN) 300 MG capsule TAKE 2 CAPSULES BY MOUTH NIGHTLY/ Lateef Patient taking differently: Take 600 mg by mouth at bedtime.  08/06/17  Yes Gillis Santa, MD  hydrALAZINE (APRESOLINE) 50 MG tablet TAKE 1 TABLET BY MOUTH TWICE DAILY 07/24/17  Yes Juline Patch, MD  HYDROcodone-acetaminophen (NORCO) 7.5-325 MG tablet Take 1 tablet by mouth 3 (three) times daily as needed for severe pain. For Chronic Pain DNF: 08/24/17, 10/23/17, 11/22/17 08/06/17  Yes Gillis Santa, MD  ketoconazole (NIZORAL) 2 % cream U UTD QHS Patient taking differently: Apply 1 application topically at bedtime. U UTD QHS 08/18/17  Yes Juline Patch, MD  levothyroxine (SYNTHROID) 200 MCG tablet Take 1 tablet (200 mcg total) by mouth daily before breakfast. 12/23/16  Yes Juline Patch, MD  Multiple Vitamins-Calcium (ONE-A-DAY WOMENS FORMULA PO) Take 1 tablet by mouth  daily.   Yes [provider]  mupirocin cream (BACTROBAN) 2 % Apply 1 application topically 2 (two) times daily. Patient taking differently: Apply 1 application topically 2 (two) times daily as needed (irritation).  07/05/16  Yes Letitia Neri L, PA-C  nystatin cream (MYCOSTATIN) Apply 1 application topically 2 (two) times daily. 08/18/17  Yes Juline Patch, MD  omega-3 acid ethyl esters (LOVAZA) 1 g capsule TAKE 2 CAPSULES BY MOUTH TWICE DAILY 06/24/17  Yes Juline Patch, MD  omeprazole (PRILOSEC) 40 MG capsule Take 1 capsule (40 mg total) by mouth daily. 12/23/16  Yes Juline Patch, MD  Polyethyl Glycol-Propyl Glycol (SYSTANE OP) Place 1 drop into both eyes daily as needed (dry eyes).    Yes [provider]  polyethylene glycol (MIRALAX / GLYCOLAX) packet Take 17 g by mouth daily.  Patient taking differently: Take 17 g by mouth daily as needed for mild constipation.  08/07/15  Yes [name], DPM  potassium chloride SA (K-DUR,KLOR-CON) 20 MEQ tablet Take 1 tablet (20 mEq total) by mouth daily. 12/23/16  Yes Juline Patch, MD  ranitidine (ZANTAC) 150 MG tablet Take 1 tablet (150 mg total) by mouth 2 (two) times daily. 12/23/16  Yes Juline Patch, MD  sulfamethoxazole-trimethoprim (BACTRIM DS,SEPTRA DS) 800-160 MG tablet Take 1 tablet by mouth 2 (two) times daily. 08/18/17  Yes Juline Patch, MD  tiZANidine (ZANAFLEX) 4 MG capsule Take 1 capsule (4 mg total) by mouth 2 (two) times daily as needed. Patient taking differently: Take 4 mg by mouth 2 (two) times daily as needed for muscle spasms.  01/22/17  Yes Gillis Santa, MD  triamterene-hydrochlorothiazide (MAXZIDE) 75-50 MG tablet Take 1 tablet by mouth daily. 12/23/16  Yes Juline Patch, MD      PHYSICAL EXAMINATION:   VITAL SIGNS: Blood pressure 116/62, pulse (!) 55, temperature 97.9 F (36.6 C), temperature source Oral, resp. rate 14, height 5\' 11"  (1.803 m), weight 109.3 kg (241 lb), SpO2 99 %.  GENERAL:  67  y.o.-year-old patient lying in the bed with no acute distress.  EYES: Pupils equal, round, reactive to light and accommodation. No scleral icterus. Extraocular muscles intact.  HEENT: Head atraumatic, normocephalic. Oropharynx and nasopharynx clear.  NECK:  Supple, no jugular venous distention. No thyroid enlargement, no tenderness.  LUNGS: Normal breath sounds bilaterally, no wheezing, rales,rhonchi or crepitation. No use of accessory muscles of respiration.  CARDIOVASCULAR: S1, S2 normal. No murmurs, rubs, or gallops.  ABDOMEN: Soft, nontender, nondistended. Bowel sounds present. No organomegaly or mass.  EXTREMITIES: No pedal edema, cyanosis, or clubbing.  NEUROLOGIC: Cranial nerves II through XII are intact. Muscle strength 5/5 in all extremities. Sensation intact. Gait not checked.  PSYCHIATRIC: The patient is alert and oriented x 3.  SKIN: No obvious rash, lesion, or ulcer.   LABORATORY PANEL:   CBC Recent Labs  Lab 08/24/17 1721  WBC 5.6  HGB 11.4*  HCT 34.2*  PLT 173  MCV 86.1  MCH 28.8  MCHC 33.4  RDW 14.6*   ------------------------------------------------------------------------------------------------------------------  Chemistries  Recent Labs  Lab 08/24/17 1721  NA 142  K 4.4  CL 107  CO2 26  GLUCOSE 100*  BUN 39*  CREATININE 2.79*  CALCIUM 9.7   ------------------------------------------------------------------------------------------------------------------ estimated creatinine clearance is 26.3 mL/min (A) (by C-G formula based on SCr of 2.79 mg/dL (H)). ------------------------------------------------------------------------------------------------------------------ No results for input(s): TSH, T4TOTAL, T3FREE, THYROIDAB in the last 72 hours.  Invalid input(s): FREET3   Coagulation profile No results for input(s): INR, PROTIME in the last 168  hours. ------------------------------------------------------------------------------------------------------------------- No results for input(s): DDIMER in the last 72 hours. -------------------------------------------------------------------------------------------------------------------  Cardiac Enzymes No results for input(s): CKMB, TROPONINI, MYOGLOBIN in the last 168 hours.  Invalid input(s): CK ------------------------------------------------------------------------------------------------------------------ Invalid input(s): POCBNP  ---------------------------------------------------------------------------------------------------------------  Urinalysis    Component Value Date/Time   COLORURINE YELLOW (A) 08/24/2017 1721   APPEARANCEUR CLEAR (A) 08/24/2017 1721   APPEARANCEUR Clear 12/27/2013 1157   LABSPEC 1.016 08/24/2017 1721   LABSPEC 1.008 12/27/2013 1157   PHURINE 7.0 08/24/2017 1721   GLUCOSEU NEGATIVE 08/24/2017 1721   GLUCOSEU Negative 12/27/2013 1157   HGBUR NEGATIVE 08/24/2017 1721   BILIRUBINUR NEGATIVE 08/24/2017 1721   BILIRUBINUR negative 08/18/2017 1011   BILIRUBINUR Negative 12/27/2013 [place] 08/24/2017 1721   PROTEINUR NEGATIVE 08/24/2017 1721   UROBILINOGEN 0.2 08/18/2017  [place] 08/24/2017 1721   LEUKOCYTESUR NEGATIVE 08/24/2017 1721   LEUKOCYTESUR Trace 12/27/2013 1157     RADIOLOGY: No results found.  EKG: Orders placed or performed during the hospital encounter of 08/24/17  . ED EKG  . ED EKG  . EKG 12-Lead  . EKG 12-Lead    IMPRESSION AND PLAN:  67 year old female patient with history of chronic kidney disease stage III, chronic anemia, hypothyroidism, hypertension, hyperlipidemia presented to the emergency room with low heart rate.  -Symptomatic bradycardia Monitor patient on telemetry Admit to observation bed  -Acute on chronic kidney injury Hold diuretics and hydrate with IV  fluid Follow-up renal function  -Hypothyroidism Continue thyroid supplements  -DVT prophylaxis subcu heparin daily   All the records are reviewed and case discussed with ED provider. Management plans discussed with the patient, family and they are in agreement.  CODE STATUS:Full code Code Status History    Date Active Date Inactive Code Status Order ID Comments User Context   08/03/2015 1503 08/07/2015 1510 Full Code [id number]  [name], DPM Inpatient       TOTAL TIME TAKING CARE OF THIS PATIENT: 54 minutes.    Saundra Shelling M.D on 08/24/2017 at 7:20 PM  Between 7am to 6pm - Pager - [phone]  After 6pm go to [email or web address] - password [email or web address] [facility]  Office  [phone]  CC: Primary care physician; Juline Patch, MD

## 2017-08-24 NOTE — ED Notes (Signed)
Continuing to obtain an IV to replace the original.

## 2017-08-24 NOTE — ED Notes (Signed)
Patient transported to 65

## 2017-08-24 NOTE — ED Provider Notes (Signed)
[facility] Emergency Department Provider Note   ____________________________________________   First MD Initiated Contact with Patient 08/24/17 1751     (approximate)  I have reviewed the triage vital signs and the nursing notes.   HISTORY  Chief Complaint Fatigue    HPI Michele Moore is a 67 y.o. female for evaluation for low heart rate and felt as though she was getting lightheaded and could pass out at her doctor's office  Patient reports that her doctor's office they checked her pulse and noticed that it was abnormal and irregular and at times it was very low.  She is been feeling a little lightheaded and weak for about 1 week now and reports it all started about the time she started antibiotic for a vaginal region infection that slowly improving.  She went for follow-up with gynecology today reports they noticed her heart rate was too low  No chest pain no nausea vomiting.  She felt lightheaded throughout the day, feels lightheaded at times, today at the gynecology office felt weak and though she could pass out.  She does reports she is tries to stay hydrated.      Past Medical History:  Diagnosis Date  . Anemia   . Arthritis   . Chronic kidney disease    STAGE 3 PER DR EASON 08/02/15  . Family history of adverse reaction to anesthesia    sister difficult to put to sleep  . GERD (gastroesophageal reflux disease)   . H/O tooth extraction    all lower teeth 1/19  . Hemorrhoid   . History of hiatal hernia   . Hypertension   . Migraines   . Mixed hyperlipidemia   . Multiple gastric ulcers   . Thyroid disease     Patient Active Problem List   Diagnosis Date Noted  . Bradycardia 08/24/2017  . Severe obesity (BMI 35.0-35.9 with comorbidity) ([place]) 08/18/2017  . Cervical facet syndrome 06/16/2017  . Osteoarthritis of right shoulder due to rotator cuff injury 06/16/2017  . Sprain of anterior talofibular ligament of right ankle 06/16/2017  .  Lumbar radiculopathy 04/21/2017  . Lumbar degenerative disc disease 04/21/2017  . Chronic pain syndrome 04/21/2017  . Spinal stenosis, lumbar region, with neurogenic claudication 04/21/2017  . Pain, lower extremity 08/03/2015    Past Surgical History:  Procedure Laterality Date  . ANKLE SURGERY    . CARPAL TUNNEL RELEASE     x3  . COLONOSCOPY  2000?  . FUSION OF TALONAVICULAR JOINT Right 08/03/2015   Procedure: TAILOR NAVICULAR JOINT FUSION - RIGHT ;  Surgeon: [name], DPM;  Location: [facility] ORS;  Service: Podiatry;  Laterality: Right;  . JOINT REPLACEMENT     knee x 3 ,right x1 and left x2  . ROTATOR CUFF REPAIR    . TONSILLECTOMY    . TUBAL LIGATION    . UPPER GI ENDOSCOPY  2000?    Prior to Admission medications   Medication Sig Start Date End Date Taking? Authorizing Provider  albuterol (PROVENTIL HFA;VENTOLIN HFA) 108 (90 Base) MCG/ACT inhaler Inhale 2 puffs into the lungs every 4 (four) hours as needed for wheezing.   Yes [provider]  allopurinol (ZYLOPRIM) 100 MG tablet Take 2 tablets by mouth daily. Dr Barb Merino 08/06/17  Yes [provider]  aspirin EC 81 MG tablet Take 81 mg by mouth daily.   Yes [provider]  Cholecalciferol (VITAMIN D3) 2000 units TABS Take 2,000 Units by mouth daily.   Yes  [provider]  diclofenac sodium (VOLTAREN) 1 % GEL Apply 2 g topically 3 (three) times daily. [facility] 08/11/17  Yes [provider]  ferrous sulfate 325 (65 FE) MG tablet Take 325 mg by mouth daily with breakfast. otc   Yes [provider]  fexofenadine-pseudoephedrine (ALLEGRA-D 24) 180-240 MG 24 hr tablet Take 1 tablet by mouth daily as needed (allergies/sinus pressure).    Yes [provider]  fluticasone (FLONASE) 50 MCG/ACT nasal spray Place 1 spray into both nostrils daily. 06/09/17 06/09/18 Yes Fisher, Linden Dolin, PA-C  gabapentin (NEURONTIN) 300 MG capsule TAKE 2 CAPSULES BY MOUTH NIGHTLY/  Lateef Patient taking differently: Take 600 mg by mouth at bedtime.  08/06/17  Yes Gillis Santa, MD  hydrALAZINE (APRESOLINE) 50 MG tablet TAKE 1 TABLET BY MOUTH TWICE DAILY 07/24/17  Yes Juline Patch, MD  HYDROcodone-acetaminophen (NORCO) 7.5-325 MG tablet Take 1 tablet by mouth 3 (three) times daily as needed for severe pain. For Chronic Pain DNF: 08/24/17, 10/23/17, 11/22/17 08/06/17  Yes Gillis Santa, MD  ketoconazole (NIZORAL) 2 % cream U UTD QHS Patient taking differently: Apply 1 application topically at bedtime. U UTD QHS 08/18/17  Yes Juline Patch, MD  levothyroxine (SYNTHROID) 200 MCG tablet Take 1 tablet (200 mcg total) by mouth daily before breakfast. 12/23/16  Yes Juline Patch, MD  Multiple Vitamins-Calcium (ONE-A-DAY WOMENS FORMULA PO) Take 1 tablet by mouth daily.   Yes [provider]  mupirocin cream (BACTROBAN) 2 % Apply 1 application topically 2 (two) times daily. Patient taking differently: Apply 1 application topically 2 (two) times daily as needed (irritation).  07/05/16  Yes Letitia Neri L, PA-C  nystatin cream (MYCOSTATIN) Apply 1 application topically 2 (two) times daily. 08/18/17  Yes Juline Patch, MD  omega-3 acid ethyl esters (LOVAZA) 1 g capsule TAKE 2 CAPSULES BY MOUTH TWICE DAILY 06/24/17  Yes Juline Patch, MD  omeprazole (PRILOSEC) 40 MG capsule Take 1 capsule (40 mg total) by mouth daily. 12/23/16  Yes Juline Patch, MD  Polyethyl Glycol-Propyl Glycol (SYSTANE OP) Place 1 drop into both eyes daily as needed (dry eyes).    Yes [provider]  polyethylene glycol (MIRALAX / GLYCOLAX) packet Take 17 g by mouth daily. Patient taking differently: Take 17 g by mouth daily as needed for mild constipation.  08/07/15  Yes [name], DPM  potassium chloride SA (K-DUR,KLOR-CON) 20 MEQ tablet Take 1 tablet (20 mEq total) by mouth daily. 12/23/16  Yes Juline Patch, MD  ranitidine (ZANTAC) 150 MG tablet Take 1 tablet (150 mg total) by mouth 2  (two) times daily. 12/23/16  Yes Juline Patch, MD  sulfamethoxazole-trimethoprim (BACTRIM DS,SEPTRA DS) 800-160 MG tablet Take 1 tablet by mouth 2 (two) times daily. 08/18/17  Yes Juline Patch, MD  tiZANidine (ZANAFLEX) 4 MG capsule Take 1 capsule (4 mg total) by mouth 2 (two) times daily as needed. Patient taking differently: Take 4 mg by mouth 2 (two) times daily as needed for muscle spasms.  01/22/17  Yes Gillis Santa, MD  triamterene-hydrochlorothiazide (MAXZIDE) 75-50 MG tablet Take 1 tablet by mouth daily. 12/23/16  Yes Juline Patch, MD    Allergies Penicillins and Vibramycin [doxycycline calcium]  Family History  Problem Relation Age of Onset  . COPD Sister   . Cancer Maternal Aunt        breast  . Cancer Maternal Uncle        kidney  Social History Social History   Tobacco Use  . Smoking status: Former Smoker    Last attempt to quit: 02/24/1993    Years since quitting: 24.5  . Smokeless tobacco: Never Used  Substance Use Topics  . Alcohol use: No    Alcohol/week: 0.0 oz  . Drug use: No    Review of Systems Constitutional: No fever/chills Eyes: No visual changes. ENT: No sore throat. Cardiovascular: Denies chest pain. Respiratory: Denies shortness of breath. Gastrointestinal: No abdominal pain.    No diarrhea.  No constipation. Genitourinary: Seeing OB/GYN for infection vaginal region Musculoskeletal: Negative for back pain. Skin: Negative for rash. Neurological: Negative for headaches, focal weakness or numbness.    ____________________________________________   PHYSICAL EXAM:  VITAL SIGNS: ED Triage Vitals  Enc Vitals Group     BP 08/24/17 1714 139/74     Pulse Rate 08/24/17 1714 79     Resp --      Temp 08/24/17 1714 97.9 F (36.6 C)     Temp Source 08/24/17 1714 Oral     SpO2 08/24/17 1714 98 %     Weight 08/24/17 1715 241 lb (109.3 kg)     Height 08/24/17 1715 5\' 11"  (1.803 m)     Head Circumference --      Peak Flow --      Pain  Score 08/24/17 1714 0     Pain Loc --      Pain Edu? --      Excl. in [place]? --     Constitutional: Alert and oriented. Well appearing and in no acute distress. Eyes: Conjunctivae are normal. Head: Atraumatic. Nose: No congestion/rhinnorhea. Mouth/Throat: Mucous membranes are moist. Neck: No stridor.   Cardiovascular: ECG tracing demonstrates rate varying from about 65-80, but she is nonconductive of frequent PVCs and when she does have occasional bigeminy heart rate drops to about 35.  Grossly normal heart sounds.  Good peripheral circulation. Respiratory: Normal respiratory effort.  No retractions. Lungs CTAB. Gastrointestinal: Soft and nontender. No distention. Musculoskeletal: No lower extremity tenderness nor edema. Neurologic:  Normal speech and language. No gross focal neurologic deficits are appreciated.  Skin:  Skin is warm, dry and intact. No rash noted. Psychiatric: Mood and affect are normal. Speech and behavior are normal.  ____________________________________________   LABS (all labs ordered are listed, but only abnormal results are displayed)  Labs Reviewed  BASIC METABOLIC PANEL - Abnormal; Notable for the following components:      Result Value   Glucose, Bld 100 (*)    BUN 39 (*)    Creatinine, Ser 2.79 (*)    GFR calc non Af Amer 16 (*)    GFR calc Af Amer 19 (*)    All other components within normal limits  CBC - Abnormal; Notable for the following components:   Hemoglobin 11.4 (*)    HCT 34.2 (*)    RDW 14.6 (*)    All other components within normal limits  URINALYSIS, COMPLETE (UACMP) WITH MICROSCOPIC - Abnormal; Notable for the following components:   Color, Urine YELLOW (*)    APPearance CLEAR (*)    All other components within normal limits   ____________________________________________  EKG  By me at 1720 Heart rate 85 9 QRS 89 QTC 420 Normal sinus rhythm with occasional PVCs.  No evidence of acute ischemia.  Of note, the patient is observed  on telemetry to having occasional episodes of bigeminy with electrical rate of about 70, but this corresponds to a  palpable pulse of mid 30s. ____________________________________________  RADIOLOGY  No results found.  ____________________________________________   PROCEDURES  Procedure(s) performed: None  Procedures  Critical Care performed: No  ____________________________________________   INITIAL IMPRESSION / ASSESSMENT AND PLAN / ED COURSE  Pertinent labs & imaging results that were available during my care of the patient were reviewed by me and considered in my medical decision making (see chart for details).  Patient returns for evaluation of low heart rate.  She did develop some symptoms including lightheadedness associated.  She is noted to have occasional bigeminy and I suspect she is not perfusing with the PVCs causing a functional bradycardia.  Discussed case with Dr. Josefa Half, we will withhold her rate reducing medications and stop her Bactrim which is suspicious for possibly causing increased creatinine.  Also hydrate gently, anticipate close observation on telemetry and hopefully reversal of her bradycardia with hydration and withdrawal of potential medications that could be causing it.  She does not have any evidence of acute coronary syndrome, she is alert, well oriented with stable hemodynamics.  Continue to monitor closely.  Patient agreeable with plan and understanding of admission discussed case with Dr. Estanislado Pandy.      ____________________________________________   FINAL CLINICAL IMPRESSION(S) / ED DIAGNOSES  Final diagnoses:  Symptomatic bradycardia  Bigeminal rhythm  Frequent PVCs  Acute on chronic renal insufficiency      NEW MEDICATIONS STARTED DURING THIS VISIT:  New Prescriptions   No medications on file     Note:  This document was prepared using Dragon voice recognition software and may include unintentional dictation errors.      Delman Kitten, MD 08/24/17 [id number]

## 2017-08-24 NOTE — ED Notes (Signed)
ED Provider at bedside. 

## 2017-08-25 DIAGNOSIS — R001 Bradycardia, unspecified: Secondary | ICD-10-CM | POA: Diagnosis not present

## 2017-08-25 DIAGNOSIS — E039 Hypothyroidism, unspecified: Secondary | ICD-10-CM | POA: Diagnosis not present

## 2017-08-25 DIAGNOSIS — I10 Essential (primary) hypertension: Secondary | ICD-10-CM | POA: Diagnosis not present

## 2017-08-25 DIAGNOSIS — N179 Acute kidney failure, unspecified: Secondary | ICD-10-CM | POA: Diagnosis not present

## 2017-08-25 LAB — BASIC METABOLIC PANEL
Anion gap: 7 (ref 5–15)
BUN: 34 mg/dL — ABNORMAL HIGH (ref 8–23)
CO2: 27 mmol/L (ref 22–32)
Calcium: 9.2 mg/dL (ref 8.9–10.3)
Chloride: 106 mmol/L (ref 98–111)
Creatinine, Ser: 2.14 mg/dL — ABNORMAL HIGH (ref 0.44–1.00)
GFR, EST AFRICAN AMERICAN: 26 mL/min — AB (ref >60)
GFR, EST NON AFRICAN AMERICAN: 23 mL/min — AB (ref >60)
GLUCOSE: 97 mg/dL (ref 70–99)
POTASSIUM: 3.9 mmol/L (ref 3.5–5.1)
Sodium: 140 mmol/L (ref 135–145)

## 2017-08-25 LAB — CBC
HEMATOCRIT: 34.5 % — AB (ref 35.0–47.0)
HEMOGLOBIN: 11.4 g/dL — AB (ref 12.0–16.0)
MCH: 28.7 pg (ref 26.0–34.0)
MCHC: 33.1 g/dL (ref 32.0–36.0)
MCV: 86.6 fL (ref 80.0–100.0)
Platelets: 157 10*3/uL (ref 150–440)
RBC: 3.98 MIL/uL (ref 3.80–5.20)
RDW: 14.4 % (ref 11.5–14.5)
WBC: 4.9 10*3/uL (ref 3.6–11.0)

## 2017-08-25 MED ORDER — POTASSIUM CHLORIDE CRYS ER 20 MEQ PO TBCR
20.0000 meq | EXTENDED_RELEASE_TABLET | Freq: Every day | ORAL | Status: DC
  Administered 2017-08-25: 20 meq via ORAL
  Filled 2017-08-25: qty 1

## 2017-08-25 MED ORDER — MECLIZINE HCL 12.5 MG PO TABS: 12.5000 mg | ORAL_TABLET | Freq: Two times a day (BID) | ORAL | 0 refills | Status: DC | PRN

## 2017-08-25 MED ORDER — FUROSEMIDE 10 MG/ML IJ SOLN: 60.0000 mg | Freq: Once | INTRAMUSCULAR | Status: DC

## 2017-08-25 MED ORDER — LEVOTHYROXINE SODIUM 100 MCG PO TABS
200.0000 ug | ORAL_TABLET | Freq: Every day | ORAL | Status: DC
  Administered 2017-08-25: 200 ug via ORAL
  Filled 2017-08-25: qty 2

## 2017-08-25 MED ORDER — MECLIZINE HCL 12.5 MG PO TABS
12.5000 mg | ORAL_TABLET | Freq: Once | ORAL | Status: AC
  Administered 2017-08-25: 12.5 mg via ORAL
  Filled 2017-08-25: qty 1

## 2017-08-25 MED ORDER — FERROUS SULFATE 325 (65 FE) MG PO TABS: 325.0000 mg | ORAL_TABLET | ORAL | Status: DC

## 2017-08-25 NOTE — Discharge Instructions (Signed)
Resume diet and activity as before ° ° °

## 2017-08-25 NOTE — Care Management Obs Status (Signed)
[facility] NOTIFICATION   Patient Details  Name: Michele Moore MRN: [id number] Date of Birth: Dec 07, 1950   Medicare Observation Status Notification Given:  Yes    Beverly Sessions, RN 08/25/2017, 4:35 PM

## 2017-08-25 NOTE — Progress Notes (Signed)
Michele Moore to be D/C'd Home per MD order. Patient given discharge teaching and paperwork regarding medications, diet, follow-up appointments and activity. Patient understanding verbalized. No questions or complaints at this time. Skin condition as charted. IV and telemetry removed prior to leaving.  An After Visit Summary was printed and given to the patient.   (Patient wants to finish dinner prior to leaving. Family/ride at bedside).   Terrilyn Saver

## 2017-08-26 ENCOUNTER — Telehealth: Payer: Self-pay

## 2017-08-26 LAB — HIV ANTIBODY (ROUTINE TESTING W REFLEX): HIV SCREEN 4TH GENERATION: NONREACTIVE

## 2017-08-26 NOTE — Telephone Encounter (Signed)
TOC #1. Called pt to f/u after d/c from [facility] on 08/25/17. At the time of this entry, pt has not yet schedule a hosp f/u appt with Dr. Ronnald Ramp. Discharge planning includes the following: - Begin Antivert - f/u with Dr. Ronnald Ramp in 1 week LVM requesting returned call.

## 2017-08-31 ENCOUNTER — Ambulatory Visit (HOSPITAL_BASED_OUTPATIENT_CLINIC_OR_DEPARTMENT_OTHER): Payer: Medicare Other | Admitting: Student in an Organized Health Care Education/Training Program

## 2017-08-31 ENCOUNTER — Encounter: Payer: Self-pay | Admitting: Student in an Organized Health Care Education/Training Program

## 2017-08-31 ENCOUNTER — Other Ambulatory Visit: Payer: Self-pay

## 2017-08-31 ENCOUNTER — Ambulatory Visit
Admission: RE | Admit: 2017-08-31 | Discharge: 2017-08-31 | Disposition: A | Discharge status: Home / Self Care | Payer: Medicare Other | Source: Ambulatory Visit | Attending: Student in an Organized Health Care Education/Training Program | Admitting: Student in an Organized Health Care Education/Training Program

## 2017-08-31 VITALS — BP 118/62 | HR 76 | Temp 98.5°F | Resp 17 | Ht 71.0 in | Wt 245.0 lb

## 2017-08-31 DIAGNOSIS — M5416 Radiculopathy, lumbar region: Secondary | ICD-10-CM | POA: Insufficient documentation

## 2017-08-31 MED ORDER — ROPIVACAINE HCL 2 MG/ML IJ SOLN
2.0000 mL | Freq: Once | INTRAMUSCULAR | Status: AC
  Administered 2017-08-31: 2 mL via EPIDURAL
  Filled 2017-08-31: qty 10

## 2017-08-31 MED ORDER — DIAZEPAM 5 MG PO TABS
2.5000 mg | ORAL_TABLET | Freq: Once | ORAL | Status: AC
  Administered 2017-08-31: 2.5 mg via ORAL

## 2017-08-31 MED ORDER — SODIUM CHLORIDE 0.9% FLUSH
2.0000 mL | Freq: Once | INTRAVENOUS | Status: AC
  Administered 2017-08-31: 2 mL

## 2017-08-31 MED ORDER — LACTATED RINGERS IV SOLN: 1000.0000 mL | Freq: Once | INTRAVENOUS | Status: DC

## 2017-08-31 MED ORDER — LIDOCAINE HCL (PF) 1 % IJ SOLN
4.5000 mL | Freq: Once | INTRAMUSCULAR | Status: AC
  Administered 2017-08-31: 4.5 mL
  Filled 2017-08-31: qty 5

## 2017-08-31 MED ORDER — DIAZEPAM 5 MG PO TABS
ORAL_TABLET | ORAL | Status: AC
  Filled 2017-08-31: qty 1

## 2017-08-31 MED ORDER — IOPAMIDOL (ISOVUE-M 200) INJECTION 41%
10.0000 mL | Freq: Once | INTRAMUSCULAR | Status: AC
  Administered 2017-08-31: 10 mL via EPIDURAL
  Filled 2017-08-31: qty 10

## 2017-08-31 MED ORDER — DEXAMETHASONE SODIUM PHOSPHATE 10 MG/ML IJ SOLN
10.0000 mg | Freq: Once | INTRAMUSCULAR | Status: AC
  Administered 2017-08-31: 10 mg
  Filled 2017-08-31: qty 1

## 2017-08-31 MED ORDER — FENTANYL CITRATE (PF) 100 MCG/2ML IJ SOLN: 25.0000 ug | INTRAMUSCULAR | Status: DC | PRN

## 2017-08-31 NOTE — Progress Notes (Signed)
Safety precautions to be maintained throughout the outpatient stay will include: orient to surroundings, keep bed in low position, maintain call bell within reach at all times, provide assistance with transfer out of bed and ambulation.  

## 2017-08-31 NOTE — Patient Instructions (Signed)
Pain Management Discharge Instructions  General Discharge Instructions :  If you need to reach your doctor call: Monday-Friday 8:00 am - 4:00 pm at [phone] or toll free [phone].  After clinic hours [phone] to have operator reach doctor.  Bring all of your medication bottles to all your appointments in the pain clinic.  To cancel or reschedule your appointment with Pain Management please remember to call 24 hours in advance to avoid a fee.  Refer to the educational materials which you have been given on: General Risks, I had my Procedure. Discharge Instructions, Post Sedation.  Post Procedure Instructions:  The drugs you were given will stay in your system until tomorrow, so for the next 24 hours you should not drive, make any legal decisions or drink any alcoholic beverages.  You may eat anything you prefer, but it is better to start with liquids then soups and crackers, and gradually work up to solid foods.  Please notify your doctor immediately if you have any unusual bleeding, trouble breathing or pain that is not related to your normal pain.  Depending on the type of procedure that was done, some parts of your body may feel week and/or numb.  This usually clears up by tonight or the next day.  Walk with the use of an assistive device or accompanied by an adult for the 24 hours.  You may use ice on the affected area for the first 24 hours.  Put ice in a Ziploc bag and cover with a towel and place against area 15 minutes on 15 minutes off.  You may switch to heat after 24 hours.Epidural Steroid Injection An epidural steroid injection is a shot of steroid medicine and numbing medicine that is given into the space between the spinal cord and the bones in your back (epidural space). The shot helps relieve pain caused by an irritated or swollen nerve root. The amount of pain relief you get from the injection depends on what is causing the nerve to be swollen and irritated,  and how long your pain lasts. You are more likely to benefit from this injection if your pain is strong and comes on suddenly rather than if you have had pain for a long time. Tell a health care provider about:  Any allergies you have.  All medicines you are taking, including vitamins, herbs, eye drops, creams, and over-the-counter medicines.  Any problems you or family members have had with anesthetic medicines.  Any blood disorders you have.  Any surgeries you have had.  Any medical conditions you have.  Whether you are pregnant or may be pregnant. What are the risks? Generally, this is a safe procedure. However, problems may occur, including:  Headache.  Bleeding.  Infection.  Allergic reaction to medicines.  Damage to your nerves.  What happens before the procedure? Staying hydrated Follow instructions from your health care provider about hydration, which may include:  Up to 2 hours before the procedure - you may continue to drink clear liquids, such as water, clear fruit juice, black coffee, and plain tea.  Eating and drinking restrictions Follow instructions from your health care provider about eating and drinking, which may include:  8 hours before the procedure - stop eating heavy meals or foods such as meat, fried foods, or fatty foods.  6 hours before the procedure - stop eating light meals or foods, such as toast or cereal.  6 hours before the procedure - stop drinking milk or drinks that contain milk.    2 hours before the procedure - stop drinking clear liquids.  Medicine  You may be given medicines to lower anxiety.  Ask your health care provider about: ? Changing or stopping your regular medicines. This is especially important if you are taking diabetes medicines or blood thinners. ? Taking medicines such as aspirin and ibuprofen. These medicines can thin your blood. Do not take these medicines before your procedure if your health care provider  instructs you not to. General instructions  Plan to have someone take you home from the hospital or clinic. What happens during the procedure?  You may receive a medicine to help you relax (sedative).  You will be asked to lie on your abdomen.  The injection site will be cleaned.  A numbing medicine (local anesthetic) will be used to numb the injection site.  A needle will be inserted through your skin into the epidural space. You may feel some discomfort when this happens. An X-ray machine will be used to make sure the needle is put as close as possible to the affected nerve.  A steroid medicine and a local anesthetic will be injected into the epidural space.  The needle will be removed.  A bandage (dressing) will be put over the injection site. What happens after the procedure?  Your blood pressure, heart rate, breathing rate, and blood oxygen level will be monitored until the medicines you were given have worn off.  Your arm or leg may feel weak or numb for a few hours.  The injection site may feel sore.  Do not drive for 24 hours if you received a sedative. This information is not intended to replace advice given to you by your health care provider. Make sure you discuss any questions you have with your health care provider. Document Released: 05/20/2007 Document Revised: 07/25/2015 Document Reviewed: 05/29/2015 Elsevier Interactive Patient Education  2018 [employer]  

## 2017-08-31 NOTE — Progress Notes (Signed)
Patient's Name: Michele Moore  MRN: [id number]  Referring Provider: Juline Patch, MD  DOB: 29-Nov-1950  PCP: Juline Patch, MD  DOS: 08/31/2017  Note by: Gillis Santa, MD  Service setting: Ambulatory outpatient  Specialty: Interventional Pain Management  Patient type: Established  Location: [facility] (AMB) Pain Management Facility  Visit type: Interventional Procedure   Primary Reason for Visit: Interventional Pain Management Treatment. CC: Procedure  Procedure:          Anesthesia, Analgesia, Anxiolysis:  Type: Therapeutic Inter-Laminar Epidural Steroid Injection #1  Region: Lumbar Level: L4-5 Level. Laterality: Midline         Type: Moderate (Conscious) Sedation combined with Local Anesthesia Indication(s): Analgesia and Anxiety Route: Intravenous (IV) IV Access: Secured Sedation: Meaningful verbal contact was maintained at all times during the procedure  Local Anesthetic: Lidocaine 1%   Indications: 1. Lumbar radiculopathy    Pain Score: Pre-procedure: 7 /10 Post-procedure: 7 /10  Pre-op Assessment:  Michele Moore is a 67 y.o. (year old), female patient, seen today for interventional treatment. She  has a past surgical history that includes Carpal tunnel release; Rotator cuff repair; Ankle surgery; Tubal ligation; Colonoscopy (2000?); Upper gi endoscopy (2000?); Joint replacement; Tonsillectomy; and Fusion of talonavicular joint (Right, 08/03/2015). Michele Moore has a current medication list which includes the following prescription(s): albuterol, allopurinol, aspirin ec, vitamin d3, diclofenac sodium, ferrous sulfate, fexofenadine-pseudoephedrine, fluticasone, gabapentin, hydralazine, hydrocodone-acetaminophen, ketoconazole, levothyroxine, meclizine, multiple vitamins-calcium, mupirocin cream, nystatin cream, omega-3 acid ethyl esters, omeprazole, polyethyl glycol-propyl glycol, polyethylene glycol, potassium chloride sa, ranitidine, sulfamethoxazole-trimethoprim, tizanidine, and  triamterene-hydrochlorothiazide, and the following Facility-Administered Medications: fentanyl and lactated ringers. Her primarily concern today is the Procedure  Initial Vital Signs:  Pulse/HCG Rate: 62  Temp: 98.5 F (36.9 C) Resp: 18 BP: 139/72 SpO2: 100 %  BMI: Estimated body mass index is 34.17 kg/m as calculated from the following:   Height as of this encounter: 5\' 11"  (1.803 m).   Weight as of this encounter: 245 lb (111.1 kg).  Risk Assessment: Allergies: Reviewed. She is allergic to penicillins and vibramycin [doxycycline calcium].  Allergy Precautions: None required Coagulopathies: Reviewed. None identified.  Blood-thinner therapy: None at this time Active Infection(s): Reviewed. None identified. Michele Moore is afebrile  Site Confirmation: Michele Moore was asked to confirm the procedure and laterality before marking the site Procedure checklist: Completed Consent: Before the procedure and under the influence of no sedative(s), amnesic(s), or anxiolytics, the patient was informed of the treatment options, risks and possible complications. To fulfill our ethical and legal obligations, as recommended by the American Medical Association's Code of Ethics, I have informed the patient of my clinical impression; the nature and purpose of the treatment or procedure; the risks, benefits, and possible complications of the intervention; the alternatives, including doing nothing; the risk(s) and benefit(s) of the alternative treatment(s) or procedure(s); and the risk(s) and benefit(s) of doing nothing. The patient was provided information about the general risks and possible complications associated with the procedure. These may include, but are not limited to: failure to achieve desired goals, infection, bleeding, organ or nerve damage, allergic reactions, paralysis, and death. In addition, the patient was informed of those risks and complications associated to Spine-related procedures, such as  failure to decrease pain; infection (i.e.: Meningitis, epidural or intraspinal abscess); bleeding (i.e.: epidural hematoma, subarachnoid hemorrhage, or any other type of intraspinal or peri-dural bleeding); organ or nerve damage (i.e.: Any type of peripheral nerve, nerve root, or spinal cord injury) with subsequent damage to sensory,  motor, and/or autonomic systems, resulting in permanent pain, numbness, and/or weakness of one or several areas of the body; allergic reactions; (i.e.: anaphylactic reaction); and/or death. Furthermore, the patient was informed of those risks and complications associated with the medications. These include, but are not limited to: allergic reactions (i.e.: anaphylactic or anaphylactoid reaction(s)); adrenal axis suppression; blood sugar elevation that in diabetics may result in ketoacidosis or comma; water retention that in patients with history of congestive heart failure may result in shortness of breath, pulmonary edema, and decompensation with resultant heart failure; weight gain; swelling or edema; medication-induced neural toxicity; particulate matter embolism and blood vessel occlusion with resultant organ, and/or nervous system infarction; and/or aseptic necrosis of one or more joints. Finally, the patient was informed that Medicine is not an exact science; therefore, there is also the possibility of unforeseen or unpredictable risks and/or possible complications that may result in a catastrophic outcome. The patient indicated having understood very clearly. We have given the patient no guarantees and we have made no promises. Enough time was given to the patient to ask questions, all of which were answered to the patient's satisfaction. Michele Moore has indicated that she wanted to continue with the procedure. Attestation: I, the ordering provider, attest that I have discussed with the patient the benefits, risks, side-effects, alternatives, likelihood of achieving goals, and  potential problems during recovery for the procedure that I have provided informed consent. Date  Time: 08/31/2017  9:24 AM  Pre-Procedure Preparation:  Monitoring: As per clinic protocol. Respiration, ETCO2, SpO2, BP, heart rate and rhythm monitor placed and checked for adequate function Safety Precautions: Patient was assessed for positional comfort and pressure points before starting the procedure. Time-out: I initiated and conducted the "Time-out" before starting the procedure, as per protocol. The patient was asked to participate by confirming the accuracy of the "Time Out" information. Verification of the correct person, site, and procedure were performed and confirmed by me, the nursing staff, and the patient. "Time-out" conducted as per Joint Commission's Universal Protocol (UP.01.01.01). Time: 1015  Description of Procedure:          Position: Prone with head of the table was raised to facilitate breathing. Target Area: The interlaminar space, initially targeting the lower laminar border of the superior vertebral body. Approach: Paramedial approach. Area Prepped: Entire Posterior Lumbar Region Prepping solution: ChloraPrep (2% chlorhexidine gluconate and 70% isopropyl alcohol) Safety Precautions: Aspiration looking for blood return was conducted prior to all injections. At no point did we inject any substances, as a needle was being advanced. No attempts were made at seeking any paresthesias. Safe injection practices and needle disposal techniques used. Medications properly checked for expiration dates. SDV (single dose vial) medications used. Description of the Procedure: Protocol guidelines were followed. The procedure needle was introduced through the skin, ipsilateral to the reported pain, and advanced to the target area. Bone was contacted and the needle walked caudad, until the lamina was cleared. The epidural space was identified using "loss-of-resistance technique" with 2-3 ml of  PF-NaCl (0.9% NSS), in a 5cc LOR glass syringe. Vitals:   08/31/17 0938 08/31/17 1015 08/31/17 1020  BP: 139/72 132/70 118/62  Pulse: 62 75 76  Resp: 18 18 17   Temp: 98.5 F (36.9 C)    SpO2: 100% 98% 97%  Weight: 245 lb (111.1 kg)    Height: 5\' 11"  (1.803 m)      Start Time: 1015 hrs. End Time: 1025 hrs. Materials:  Needle(s) Type: Epidural needle Gauge: 17G  Length: 3.5-in Medication(s): Please see orders for medications and dosing details. 8CC solution made of 5 cc of preservative-free saline,2 cc of 0.2% ropivacaine, 1 cc of Decadron 10 mg/cc Imaging Guidance (Spinal):          Type of Imaging Technique: Fluoroscopy Guidance (Spinal) Indication(s): Assistance in needle guidance and placement for procedures requiring needle placement in or near specific anatomical locations not easily accessible without such assistance. Exposure Time: Please see nurses notes. Contrast: Before injecting any contrast, we confirmed that the patient did not have an allergy to iodine, shellfish, or radiological contrast. Once satisfactory needle placement was completed at the desired level, radiological contrast was injected. Contrast injected under live fluoroscopy. No contrast complications. See chart for type and volume of contrast used. Fluoroscopic Guidance: I was personally present during the use of fluoroscopy. "Tunnel Vision Technique" used to obtain the best possible view of the target area. Parallax error corrected before commencing the procedure. "Direction-depth-direction" technique used to introduce the needle under continuous pulsed fluoroscopy. Once target was reached, antero-posterior, oblique, and lateral fluoroscopic projection used confirm needle placement in all planes. Images permanently stored in EMR. Interpretation: I personally interpreted the imaging intraoperatively. Adequate needle placement confirmed in multiple planes. Appropriate spread of contrast into desired area was observed.  No evidence of afferent or efferent intravascular uptake. No intrathecal or subarachnoid spread observed. Permanent images saved into the patient's record.  Antibiotic Prophylaxis:   Anti-infectives (From admission, onward)   None     Indication(s): None identified  Post-operative Assessment:  Post-procedure Vital Signs:  Pulse/HCG Rate: 76  Temp: 98.5 F (36.9 C) Resp: 17 BP: 118/62 SpO2: 97 %  EBL: None  Complications: No immediate post-treatment complications observed by team, or reported by patient.  Note: The patient tolerated the entire procedure well. A repeat set of vitals were taken after the procedure and the patient was kept under observation following institutional policy, for this type of procedure. Post-procedural neurological assessment was performed, showing return to baseline, prior to discharge. The patient was provided with post-procedure discharge instructions, including a section on how to identify potential problems. Should any problems arise concerning this procedure, the patient was given instructions to immediately contact us, at any time, without hesitation. In any case, we plan to contact the patient by telephone for a follow-up status report regarding this interventional procedure.  Comments:  No additional relevant information. 5 out of 5 strength bilateral lower extremity: Plantar flexion, dorsiflexion, knee flexion, knee extension.  Plan of Care    Imaging Orders     DG C-Arm 1-60 Min-No Report Procedure Orders    No procedure(s) ordered today    Medications ordered for procedure: Meds ordered this encounter  Medications  . lactated ringers infusion 1,000 mL  . fentaNYL (SUBLIMAZE) injection 25-100 mcg    Make sure Narcan is available in the pyxis when using this medication. In the event of respiratory depression (RR< 8/min): Titrate NARCAN (naloxone) in increments of 0.1 to 0.2 mg IV at 2-3 minute intervals, until desired degree of reversal.   . iopamidol (ISOVUE-M) 41 % intrathecal injection 10 mL  . ropivacaine (PF) 2 mg/mL (0.2%) (NAROPIN) injection 2 mL  . sodium chloride flush (NS) 0.9 % injection 2 mL  . lidocaine (PF) (XYLOCAINE) 1 % injection 4.5 mL  . dexamethasone (DECADRON) injection 10 mg  . diazepam (VALIUM) tablet 2.5 mg   Medications administered: We administered iopamidol, ropivacaine (PF) 2 mg/mL (0.2%), sodium chloride flush, lidocaine (PF), dexamethasone, and diazepam.  See the medical record for exact dosing, route, and time of administration.  New Prescriptions   No medications on file   Disposition: Discharge home  Discharge Date & Time: 08/31/2017; 1031 hrs.   Physician-requested Follow-up: Return in about 1 month (around 09/28/2017) for Post Procedure Evaluation.  Future Appointments  Date Time Provider [place]  09/23/2017 10:00 AM Lucilla Lame, MD [facility] None  09/28/2017 12:45 PM Gillis Santa, MD [facility] None  12/03/2017 10:45 AM Gillis Santa, MD [facility] None   Primary Care Physician: Juline Patch, MD Location: [facility] Outpatient [facility] Management Facility Note by: Gillis Santa, MD Date: 08/31/2017; Time: 11:26 AM  Disclaimer:  Medicine is not an exact science. The only guarantee in medicine is that nothing is guaranteed. It is important to note that the decision to proceed with this intervention was based on the information collected from the patient. The Data and conclusions were drawn from the patient's questionnaire, the interview, and the physical examination. Because the information was provided in large part by the patient, it cannot be guaranteed that it has not been purposely or unconsciously manipulated. Every effort has been made to obtain as much relevant data as possible for this evaluation. It is important to note that the conclusions that lead to this procedure are derived in large part from the available data. Always take into account that the treatment will also be dependent  on availability of resources and existing treatment guidelines, considered by other Pain Management Practitioners as being common knowledge and practice, at the time of the intervention. For Medico-Legal purposes, it is also important to point out that variation in procedural techniques and pharmacological choices are the acceptable norm. The indications, contraindications, technique, and results of the above procedure should only be interpreted and judged by a Board-Certified Interventional Pain Specialist with extensive familiarity and expertise in the same exact procedure and technique.

## 2017-09-01 ENCOUNTER — Telehealth: Payer: Self-pay | Admitting: *Deleted

## 2017-09-01 NOTE — Telephone Encounter (Signed)
No problems post procedure. 

## 2017-09-04 NOTE — Discharge Summary (Signed)
[facility] NAME: Michele Moore    MR#:  [id number]  DATE OF BIRTH:  September 21, 1950  DATE OF ADMISSION:  08/24/2017 ADMITTING PHYSICIAN: Saundra Shelling, MD  DATE OF DISCHARGE: 08/25/2017  7:19 PM  PRIMARY CARE PHYSICIAN: Juline Patch, MD   ADMISSION DIAGNOSIS:  Bigeminal rhythm [I49.9] Acute on chronic renal insufficiency [N28.9, N18.9] Symptomatic bradycardia [R00.1] Frequent PVCs [I49.3]  DISCHARGE DIAGNOSIS:  Active Problems:   Bradycardia   SECONDARY DIAGNOSIS:   Past Medical History:  Diagnosis Date  . Anemia   . Arthritis   . Chronic kidney disease    STAGE 3 PER DR EASON 08/02/15  . Family history of adverse reaction to anesthesia    sister difficult to put to sleep  . GERD (gastroesophageal reflux disease)   . H/O tooth extraction    all lower teeth 1/19  . Hemorrhoid   . History of hiatal hernia   . Hypertension   . Migraines   . Mixed hyperlipidemia   . Multiple gastric ulcers   . Thyroid disease      ADMITTING HISTORY  HISTORY OF PRESENT ILLNESS: Michele Moore  is a 67 y.o. female with a known history of chronic kidney disease stage III, GERD, hiatal hernia, hypertension, hyperlipidemia, hypothyroidism presented to the emergency room with generalized weakness and fatigue.She went to doctor's office for checkup and they found her pulse was low.  Patient had bradycardia around 40 bpm, which she was referred to the emergency room.  Patient recently finished a course of Bactrim antibiotic.  She also used a nystatin cream for vaginal yeast infection.  Patient says she feels tired and was about to pass out.  Pulse rate has improved after presentation to the emergency room and bradycardia is resolved.  During the work-up in the emergency room her kidney functions have worsened and creatinine is around 2.7.  HOSPITAL COURSE:   * Sinus bradycardia. Admitted to telemetry floor.  Heart rate stayed in the 50s to 60s and  asymptomatic. No further work-up needed.  *Acute kidney injury over CKD stage III.  Started on IV fluids and is close to baseline at discharge.  Advised to keep well-hydrated.  *Hypothyroidism.  Continue levothyroxine.  TSH normal.  Discharged home in stable condition  *   CONSULTS OBTAINED:    DRUG ALLERGIES:   Allergies  Allergen Reactions  . Penicillins Anaphylaxis and Swelling    Has patient had a PCN reaction causing immediate rash, facial/tongue/throat swelling, SOB or lightheadedness with hypotension: Yes Has patient had a PCN reaction causing severe rash involving mucus membranes or skin necrosis: No Has patient had a PCN reaction that required hospitalization: No Has patient had a PCN reaction occurring within the last 10 years: Yes If all of the above answers are "NO", then may proceed with Cephalosporin use.  . Vibramycin [Doxycycline Calcium] Rash    DISCHARGE MEDICATIONS:   Allergies as of 08/25/2017      Reactions   Penicillins Anaphylaxis, Swelling   Has patient had a PCN reaction causing immediate rash, facial/tongue/throat swelling, SOB or lightheadedness with hypotension: Yes Has patient had a PCN reaction causing severe rash involving mucus membranes or skin necrosis: No Has patient had a PCN reaction that required hospitalization: No Has patient had a PCN reaction occurring within the last 10 years: Yes If all of the above answers are "NO", then may proceed with Cephalosporin use.   Vibramycin [doxycycline Calcium] Rash  Medication List    TAKE these medications   albuterol 108 (90 Base) MCG/ACT inhaler Commonly known as:  PROVENTIL HFA;VENTOLIN HFA Inhale 2 puffs into the lungs every 4 (four) hours as needed for wheezing.   allopurinol 100 MG tablet Commonly known as:  ZYLOPRIM Take 2 tablets by mouth daily. Dr Barb Merino   aspirin EC 81 MG tablet Take 81 mg by mouth daily.   diclofenac sodium 1 % Gel Commonly known as:  VOLTAREN Apply 2 g  topically 3 (three) times daily. [facility]   ferrous sulfate 325 (65 FE) MG tablet Take 325 mg by mouth daily with breakfast. otc   fexofenadine-pseudoephedrine 180-240 MG 24 hr tablet Commonly known as:  ALLEGRA-D 24 Take 1 tablet by mouth daily as needed (allergies/sinus pressure).   fluticasone 50 MCG/ACT nasal spray Commonly known as:  FLONASE Place 1 spray into both nostrils daily.   gabapentin 300 MG capsule Commonly known as:  NEURONTIN TAKE 2 CAPSULES BY MOUTH NIGHTLY/ Late[facility] What changed:    how much to take  how to take this  when to take this  additional instructions   hydrALAZINE 50 MG tablet Commonly known as:  APRESOLINE TAKE 1 TABLET BY MOUTH TWICE DAILY   HYDROcodone-acetaminophen 7.5-325 MG tablet Commonly known as:  NORCO Take 1 tablet by mouth 3 (three) times daily as needed for severe pain. For Chronic Pain DNF: 08/24/17, 10/23/17, 11/22/17   ketoconazole 2 % cream Commonly known as:  NIZORAL U UTD QHS What changed:    how much to take  how to take this  when to take this  additional instructions   levothyroxine 200 MCG tablet Commonly known as:  SYNTHROID Take 1 tablet (200 mcg total) by mouth daily before breakfast.   meclizine 12.5 MG tablet Commonly known as:  ANTIVERT Take 1 tablet (12.5 mg total) by mouth 2 (two) times daily as needed for dizziness.   mupirocin cream 2 % Commonly known as:  BACTROBAN Apply 1 application topically 2 (two) times daily. What changed:    when to take this  reasons to take this   nystatin cream Commonly known as:  MYCOSTATIN Apply 1 application topically 2 (two) times daily.   omega-3 acid ethyl esters 1 g capsule Commonly known as:  LOVAZA TAKE 2 CAPSULES BY MOUTH TWICE DAILY   omeprazole 40 MG capsule Commonly known as:  PRILOSEC Take 1 capsule (40 mg total) by mouth daily.   ONE-A-DAY WOMENS FORMULA PO Take 1 tablet by mouth daily.   polyethylene glycol  packet Commonly known as:  MIRALAX / GLYCOLAX Take 17 g by mouth daily. What changed:    when to take this  reasons to take this   potassium chloride SA 20 MEQ tablet Commonly known as:  K-DUR,KLOR-CON Take 1 tablet (20 mEq total) by mouth daily.   ranitidine 150 MG tablet Commonly known as:  ZANTAC Take 1 tablet (150 mg total) by mouth 2 (two) times daily.   sulfamethoxazole-trimethoprim 800-160 MG tablet Commonly known as:  BACTRIM DS,SEPTRA DS Take 1 tablet by mouth 2 (two) times daily.   SYSTANE OP Place 1 drop into both eyes daily as needed (dry eyes).   tiZANidine 4 MG capsule Commonly known as:  ZANAFLEX Take 1 capsule (4 mg total) by mouth 2 (two) times daily as needed. What changed:  reasons to take this   triamterene-hydrochlorothiazide 75-50 MG tablet Commonly known as:  MAXZIDE Take 1 tablet by mouth daily.   Vitamin  D3 2000 units Tabs Take 2,000 Units by mouth daily.       Today   VITAL SIGNS:  Blood pressure 131/62, pulse 87, temperature 97.6 F (36.4 C), temperature source Oral, resp. rate 18, height 5\' 11"  (1.803 m), weight 107.2 kg (236 lb 6.4 oz), SpO2 99 %.  I/O:  No intake or output data in the 24 hours ending 09/04/17 1522  PHYSICAL EXAMINATION:  Physical Exam  GENERAL:  67 y.o.-year-old patient lying in the bed with no acute distress.  LUNGS: Normal breath sounds bilaterally, no wheezing, rales,rhonchi or crepitation. No use of accessory muscles of respiration.  CARDIOVASCULAR: S1, S2 normal. No murmurs, rubs, or gallops.  ABDOMEN: Soft, non-tender, non-distended. Bowel sounds present. No organomegaly or mass.  NEUROLOGIC: Moves all 4 extremities. PSYCHIATRIC: The patient is alert and oriented x 3.  SKIN: No obvious rash, lesion, or ulcer.   DATA REVIEW:   CBC No results for input(s): WBC, HGB, HCT, PLT in the last 168 hours.  Chemistries  No results for input(s): NA, K, CL, CO2, GLUCOSE, BUN, CREATININE, CALCIUM, MG, AST, ALT,  ALKPHOS, BILITOT in the last 168 hours.  Invalid input(s): GFRCGP  Cardiac Enzymes No results for input(s): TROPONINI in the last 168 hours.  Microbiology Results  No results found for this or any previous visit.  RADIOLOGY:  No results found.  Follow up with PCP in 1 week.  Management plans discussed with the patient, family and they are in agreement.  CODE STATUS:  Code Status History    Date Active Date Inactive Code Status Order ID Comments User Context   08/24/2017 2117 08/25/2017 2220 Full Code [id number]  Saundra Shelling, MD Inpatient   08/03/2015 1503 08/07/2015 1510 Full Code [id number]  [name], DPM Inpatient      TOTAL TIME TAKING CARE OF THIS PATIENT ON DAY OF DISCHARGE: more than 30 minutes.   Neita Carp M.D on 09/04/2017 at 3:22 PM  Between 7am to 6pm - Pager - [phone]  After 6pm go to [email or web address] - password [email or web address] [facility]  Office  [phone]  CC: Primary care physician; Juline Patch, MD  Note: This dictation was prepared with Dragon dictation along with smaller phrase technology. Any transcriptional errors that result from this process are unintentional.

## 2017-09-07 ENCOUNTER — Emergency Department
Admission: EM | Admit: 2017-09-07 | Discharge: 2017-09-07 | Disposition: A | Discharge status: Home / Self Care | Payer: Medicare Other | Attending: Emergency Medicine | Admitting: Emergency Medicine

## 2017-09-07 ENCOUNTER — Encounter: Payer: Self-pay | Admitting: Emergency Medicine

## 2017-09-07 ENCOUNTER — Emergency Department: Payer: Medicare Other

## 2017-09-07 ENCOUNTER — Other Ambulatory Visit: Payer: Self-pay

## 2017-09-07 DIAGNOSIS — R202 Paresthesia of skin: Secondary | ICD-10-CM

## 2017-09-07 DIAGNOSIS — R42 Dizziness and giddiness: Secondary | ICD-10-CM | POA: Diagnosis not present

## 2017-09-07 DIAGNOSIS — Z87891 Personal history of nicotine dependence: Secondary | ICD-10-CM | POA: Insufficient documentation

## 2017-09-07 DIAGNOSIS — R0602 Shortness of breath: Secondary | ICD-10-CM | POA: Insufficient documentation

## 2017-09-07 DIAGNOSIS — R200 Anesthesia of skin: Secondary | ICD-10-CM | POA: Diagnosis not present

## 2017-09-07 DIAGNOSIS — R079 Chest pain, unspecified: Secondary | ICD-10-CM

## 2017-09-07 DIAGNOSIS — E782 Mixed hyperlipidemia: Secondary | ICD-10-CM | POA: Diagnosis not present

## 2017-09-07 DIAGNOSIS — I129 Hypertensive chronic kidney disease with stage 1 through stage 4 chronic kidney disease, or unspecified chronic kidney disease: Secondary | ICD-10-CM | POA: Insufficient documentation

## 2017-09-07 DIAGNOSIS — Z7982 Long term (current) use of aspirin: Secondary | ICD-10-CM | POA: Diagnosis not present

## 2017-09-07 DIAGNOSIS — R0789 Other chest pain: Secondary | ICD-10-CM | POA: Diagnosis not present

## 2017-09-07 DIAGNOSIS — Z79899 Other long term (current) drug therapy: Secondary | ICD-10-CM | POA: Insufficient documentation

## 2017-09-07 DIAGNOSIS — N183 Chronic kidney disease, stage 3 (moderate): Secondary | ICD-10-CM | POA: Diagnosis not present

## 2017-09-07 HISTORY — DX: Systemic involvement of connective tissue, unspecified: M35.9

## 2017-09-07 LAB — BASIC METABOLIC PANEL
ANION GAP: 6 (ref 5–15)
BUN: 25 mg/dL — ABNORMAL HIGH (ref 8–23)
CALCIUM: 8.9 mg/dL (ref 8.9–10.3)
CO2: 27 mmol/L (ref 22–32)
CREATININE: 1.72 mg/dL — AB (ref 0.44–1.00)
Chloride: 109 mmol/L (ref 98–111)
GFR calc Af Amer: 34 mL/min — ABNORMAL LOW (ref >60)
GFR, EST NON AFRICAN AMERICAN: 29 mL/min — AB (ref >60)
GLUCOSE: 124 mg/dL — AB (ref 70–99)
Potassium: 3.4 mmol/L — ABNORMAL LOW (ref 3.5–5.1)
Sodium: 142 mmol/L (ref 135–145)

## 2017-09-07 LAB — CBC
HCT: 31.5 % — ABNORMAL LOW (ref 35.0–47.0)
HEMOGLOBIN: 10.6 g/dL — AB (ref 12.0–16.0)
MCH: 29 pg (ref 26.0–34.0)
MCHC: 33.7 g/dL (ref 32.0–36.0)
MCV: 86 fL (ref 80.0–100.0)
PLATELETS: 145 10*3/uL — AB (ref 150–440)
RBC: 3.66 MIL/uL — ABNORMAL LOW (ref 3.80–5.20)
RDW: 14.2 % (ref 11.5–14.5)
WBC: 7.5 10*3/uL (ref 3.6–11.0)

## 2017-09-07 LAB — TROPONIN I: Troponin I: <0.03 ng/mL (ref <0.03)

## 2017-09-07 LAB — FIBRIN DERIVATIVES D-DIMER (ARMC ONLY): Fibrin derivatives D-dimer (ARMC): 654.77 ng/mL (FEU) — ABNORMAL HIGH (ref 0.00–499.00)

## 2017-09-07 MED ORDER — IOPAMIDOL (ISOVUE-370) INJECTION 76%
75.0000 mL | Freq: Once | INTRAVENOUS | Status: AC | PRN
  Administered 2017-09-07: 75 mL via INTRAVENOUS

## 2017-09-07 MED ORDER — SODIUM CHLORIDE 0.9 % IV BOLUS
1000.0000 mL | Freq: Once | INTRAVENOUS | Status: AC
  Administered 2017-09-07: 1000 mL via INTRAVENOUS

## 2017-09-07 MED ORDER — GI COCKTAIL ~~LOC~~
30.0000 mL | Freq: Once | ORAL | Status: AC
  Administered 2017-09-07: 30 mL via ORAL
  Filled 2017-09-07: qty 30

## 2017-09-07 MED ORDER — KETOROLAC TROMETHAMINE 30 MG/ML IJ SOLN
30.0000 mg | Freq: Once | INTRAMUSCULAR | Status: AC
  Administered 2017-09-07: 30 mg via INTRAVENOUS
  Filled 2017-09-07: qty 1

## 2017-09-07 NOTE — ED Provider Notes (Signed)
[facility] Emergency Department Provider Note   ____________________________________________   First MD Initiated Contact with Patient 09/07/17 0221     (approximate)  I have reviewed the triage vital signs and the nursing notes.   HISTORY  Chief Complaint Chest Pain    HPI Michele Moore is a 67 y.o. female who comes into the hospital today stating that she called the ambulance because of problems with her heart.  The patient states that she had some chest pain and numbness in her arms as well as dizziness.  The patient was admitted to the hospital last week due to her heart.  She reports that it hurts when she breathes deep.  She took her medicines when she got home this afternoon after church and states that she made and was eating a baked potato.  She states that she fell asleep for a second and felt like her tongue was numb.  The patient states that her chest started hurting as well and it was so bad that she could not walk.  She had some shortness of breath but denies any nausea vomiting or abdominal pain.  The patient rates her pain an 8 out of 10 in intensity.  She is here today for evaluation of her symptoms.  Past Medical History:  Diagnosis Date  . Anemia   . Arthritis   . Chronic kidney disease    STAGE 3 PER DR EASON 08/02/15  . Collagen vascular disease ([place])   . Family history of adverse reaction to anesthesia    sister difficult to put to sleep  . GERD (gastroesophageal reflux disease)   . H/O tooth extraction    all lower teeth 1/19  . Hemorrhoid   . History of hiatal hernia   . Hypertension   . Migraines   . Mixed hyperlipidemia   . Multiple gastric ulcers   . Thyroid disease     Patient Active Problem List   Diagnosis Date Noted  . Bradycardia 08/24/2017  . Severe obesity (BMI 35.0-35.9 with comorbidity) ([place]) 08/18/2017  . Cervical facet syndrome 06/16/2017  . Osteoarthritis of right shoulder due to rotator cuff injury  06/16/2017  . Sprain of anterior talofibular ligament of right ankle 06/16/2017  . Lumbar radiculopathy 04/21/2017  . Lumbar degenerative disc disease 04/21/2017  . Chronic pain syndrome 04/21/2017  . Spinal stenosis, lumbar region, with neurogenic claudication 04/21/2017  . Pain, lower extremity 08/03/2015    Past Surgical History:  Procedure Laterality Date  . ANKLE SURGERY    . CARPAL TUNNEL RELEASE     x3  . COLONOSCOPY  2000?  . FUSION OF TALONAVICULAR JOINT Right 08/03/2015   Procedure: TAILOR NAVICULAR JOINT FUSION - RIGHT ;  Surgeon: [name], DPM;  Location: [facility] ORS;  Service: Podiatry;  Laterality: Right;  . JOINT REPLACEMENT     knee x 3 ,right x1 and left x2  . ROTATOR CUFF REPAIR    . TONSILLECTOMY    . TUBAL LIGATION    . UPPER GI ENDOSCOPY  2000?    Prior to Admission medications   Medication Sig Start Date End Date Taking? Authorizing Provider  albuterol (PROVENTIL HFA;VENTOLIN HFA) 108 (90 Base) MCG/ACT inhaler Inhale 2 puffs into the lungs every 4 (four) hours as needed for wheezing.    [provider]  allopurinol (ZYLOPRIM) 100 MG tablet Take 2 tablets by mouth daily. Dr Barb Merino 08/06/17   [provider]  aspirin EC 81 MG tablet Take 81 mg  by mouth daily.    [provider]  Cholecalciferol (VITAMIN D3) 2000 units TABS Take 2,000 Units by mouth daily.    [provider]  diclofenac sodium (VOLTAREN) 1 % GEL Apply 2 g topically 3 (three) times daily. [facility] 08/11/17   [provider]  ferrous sulfate 325 (65 FE) MG tablet Take 325 mg by mouth daily with breakfast. otc    [provider]  fexofenadine-pseudoephedrine (ALLEGRA-D 24) 180-240 MG 24 hr tablet Take 1 tablet by mouth daily as needed (allergies/sinus pressure).     [provider]  fluticasone (FLONASE) 50 MCG/ACT nasal spray Place 1 spray into both nostrils daily. 06/09/17 06/09/18  Fisher, Linden Dolin, PA-C  gabapentin  (NEURONTIN) 300 MG capsule TAKE 2 CAPSULES BY MOUTH NIGHTLY/ [place] Patient taking differently: Take 600 mg by mouth at bedtime.  08/06/17   Gillis Santa, MD  hydrALAZINE (APRESOLINE) 50 MG tablet TAKE 1 TABLET BY MOUTH TWICE DAILY 07/24/17   Juline Patch, MD  HYDROcodone-acetaminophen (NORCO) 7.5-325 MG tablet Take 1 tablet by mouth 3 (three) times daily as needed for severe pain. For Chronic Pain DNF: 08/24/17, 10/23/17, 11/22/17 08/06/17   Gillis Santa, MD  ketoconazole (NIZORAL) 2 % cream U UTD QHS Patient taking differently: Apply 1 application topically at bedtime. U UTD QHS 08/18/17   Juline Patch, MD  levothyroxine (SYNTHROID) 200 MCG tablet Take 1 tablet (200 mcg total) by mouth daily before breakfast. 12/23/16   Juline Patch, MD  meclizine (ANTIVERT) 12.5 MG tablet Take 1 tablet (12.5 mg total) by mouth 2 (two) times daily as needed for dizziness. 08/25/17   Hillary Bow, MD  Multiple Vitamins-Calcium (ONE-A-DAY WOMENS FORMULA PO) Take 1 tablet by mouth daily.    [provider]  mupirocin cream (BACTROBAN) 2 % Apply 1 application topically 2 (two) times daily. Patient taking differently: Apply 1 application topically 2 (two) times daily as needed (irritation).  07/05/16   Johnn Hai, PA-C  nystatin cream (MYCOSTATIN) Apply 1 application topically 2 (two) times daily. 08/18/17   Juline Patch, MD  omega-3 acid ethyl esters (LOVAZA) 1 g capsule TAKE 2 CAPSULES BY MOUTH TWICE DAILY 06/24/17   Juline Patch, MD  omeprazole (PRILOSEC) 40 MG capsule Take 1 capsule (40 mg total) by mouth daily. 12/23/16   Juline Patch, MD  Polyethyl Glycol-Propyl Glycol (SYSTANE OP) Place 1 drop into both eyes daily as needed (dry eyes).     [provider]  polyethylene glycol (MIRALAX / GLYCOLAX) packet Take 17 g by mouth daily. Patient taking differently: Take 17 g by mouth daily as needed for mild constipation.  08/07/15   [name], DPM  potassium chloride SA  (K-DUR,KLOR-CON) 20 MEQ tablet Take 1 tablet (20 mEq total) by mouth daily. 12/23/16   Juline Patch, MD  ranitidine (ZANTAC) 150 MG tablet Take 1 tablet (150 mg total) by mouth 2 (two) times daily. 12/23/16   Juline Patch, MD  sulfamethoxazole-trimethoprim (BACTRIM DS,SEPTRA DS) 800-160 MG tablet Take 1 tablet by mouth 2 (two) times daily. 08/18/17   Juline Patch, MD  tiZANidine (ZANAFLEX) 4 MG capsule Take 1 capsule (4 mg total) by mouth 2 (two) times daily as needed. Patient taking differently: Take 4 mg by mouth 2 (two) times daily as needed for muscle spasms.  01/22/17   Gillis Santa, MD  triamterene-hydrochlorothiazide (MAXZIDE) 75-50 MG tablet Take 1 tablet by mouth daily. 12/23/16   Juline Patch,  MD    Allergies Penicillins and Vibramycin [doxycycline calcium]  Family History  Problem Relation Age of Onset  . COPD Sister   . Cancer Maternal Aunt        breast  . Cancer Maternal Uncle        kidney    Social History Social History   Tobacco Use  . Smoking status: Former Smoker    Last attempt to quit: 02/24/1993    Years since quitting: 24.5  . Smokeless tobacco: Never Used  Substance Use Topics  . Alcohol use: No    Alcohol/week: 0.0 oz  . Drug use: No    Review of Systems  Constitutional: No fever/chills Eyes: No visual changes. ENT: No sore throat. Cardiovascular:  chest pain. Respiratory:  shortness of breath. Gastrointestinal: No abdominal pain.  No nausea, no vomiting.  No diarrhea.  No constipation. Genitourinary: Negative for dysuria. Musculoskeletal: Negative for back pain. Skin: Negative for rash. Neurological: bilateral hand and tongue numbness.   ____________________________________________   PHYSICAL EXAM:  VITAL SIGNS: ED Triage Vitals  Enc Vitals Group     BP 09/07/17 0214 101/62     Pulse Rate 09/07/17 0214 67     Resp 09/07/17 0214 18     Temp 09/07/17 0214 97.6 F (36.4 C)     Temp Source 09/07/17 0214 Oral     SpO2  09/07/17 0214 94 %     Weight 09/07/17 0215 245 lb (111.1 kg)     Height 09/07/17 0215 5\' 11"  (1.803 m)     Head Circumference --      Peak Flow --      Pain Score 09/07/17 0215 8     Pain Loc --      Pain Edu? --      Excl. in [place]? --     Constitutional: sleepy but arousable, Well appearing and in moderate distress. Eyes: Conjunctivae are normal. PERRL. EOMI. Head: Atraumatic. Nose: No congestion/rhinnorhea. Mouth/Throat: Mucous membranes are moist.  Oropharynx non-erythematous. Cardiovascular: Normal rate, regular rhythm. Grossly normal heart sounds.  Good peripheral circulation. Respiratory: Normal respiratory effort.  No retractions. Lungs CTAB. Gastrointestinal: Soft and nontender. No distention. positive bowel sounds Musculoskeletal: left chest wall tender to palpation Neurologic:  Normal speech and language.  Skin:  Skin is warm, dry and intact. Marland Kitchen Psychiatric: Mood and affect are normal.   ____________________________________________   LABS (all labs ordered are listed, but only abnormal results are displayed)  Labs Reviewed  BASIC METABOLIC PANEL - Abnormal; Notable for the following components:      Result Value   Potassium 3.4 (*)    Glucose, Bld 124 (*)    BUN 25 (*)    Creatinine, Ser 1.72 (*)    GFR calc non Af Amer 29 (*)    GFR calc Af Amer 34 (*)    All other components within normal limits  CBC - Abnormal; Notable for the following components:   RBC 3.66 (*)    Hemoglobin 10.6 (*)    HCT 31.5 (*)    Platelets 145 (*)    All other components within normal limits  FIBRIN DERIVATIVES D-DIMER ([facility] ONLY) - Abnormal; Notable for the following components:   Fibrin derivatives D-dimer [facility]) 654.77 (*)    All other components within normal limits  TROPONIN I  TROPONIN I   ____________________________________________  EKG  ED ECG REPORT I, Loney Hering, the attending physician, personally viewed and interpreted this ECG.   Date: 09/07/2017  EKG Time: 216  Rate: 62  Rhythm: normal sinus rhythm  Axis: normal  Intervals:none  ST&T Change: lateral t wave flattening  ____________________________________________  RADIOLOGY  ED MD interpretation:  CXR: No active cardiopulmonary disease  CT head: Normal Brain  CT angio chest: No demonstrable pulmonary embolus, no thoracic aortic aneurysm, no dissection seen, prominence of the main pulmonary outflow tract suspected degree of pulmonary artery hypertension, areas of atelectatic change in the lower lobes and inferior lingula, no frank edema or consolidation.  Small hiatal hernia.  Official radiology report(s): Dg Chest 2 View  Result Date: 09/07/2017 CLINICAL DATA:  67 year old female with chest pain and dizziness. EXAM: CHEST - 2 VIEW COMPARISON:  Chest radiograph dated 06/09/2017 FINDINGS: The heart size and mediastinal contours are within normal limits. Both lungs are clear. The visualized skeletal structures are unremarkable. IMPRESSION: No active cardiopulmonary disease. Electronically Signed   By: Anner Crete M.D.   On: 09/07/2017 02:47   Ct Head Wo Contrast  Result Date: 09/07/2017 CLINICAL DATA:  Orthostatic dizziness EXAM: CT HEAD WITHOUT CONTRAST TECHNIQUE: Contiguous axial images were obtained from the base of the skull through the vertex without intravenous contrast. COMPARISON:  Head CT 05/13/2017 FINDINGS: Brain: There is no mass, hemorrhage or extra-axial collection. The size and configuration of the ventricles and extra-axial CSF spaces are normal. There is no acute or chronic infarction. The brain parenchyma is normal. Vascular: No abnormal hyperdensity of the major intracranial arteries or dural venous sinuses. No intracranial atherosclerosis. Skull: The visualized skull base, calvarium and extracranial soft tissues are normal. Sinuses/Orbits: No fluid levels or advanced mucosal thickening of the visualized paranasal sinuses. No mastoid or middle ear effusion. The  orbits are normal. IMPRESSION: Normal brain. Electronically Signed   By: Ulyses Jarred M.D.   On: 09/07/2017 06:11   Ct Angio Chest Pe W And/or Wo Contrast  Result Date: 09/07/2017 CLINICAL DATA:  Chest pain for several days EXAM: CT ANGIOGRAPHY CHEST WITH CONTRAST TECHNIQUE: Multidetector CT imaging of the chest was performed using the standard protocol during bolus administration of intravenous contrast. Multiplanar CT image reconstructions and MIPs were obtained to evaluate the vascular anatomy. CONTRAST:  82mL ISOVUE-370 IOPAMIDOL (ISOVUE-370) INJECTION 76% COMPARISON:  CT angiogram chest December 27, 2013; chest radiograph September 07, 2017 FINDINGS: Cardiovascular: There is no evident pulmonary embolus. There is no thoracic aortic aneurysm. No dissection evident. The contrast bolus is somewhat less than optimal for assessment for dissection by radiography. Visualized great vessels appear normal. There is apparent common origin of the right innominate and left common carotid arteries. There are foci of aortic atherosclerosis. There is no appreciable pericardial effusion or pericardial thickening. The main pulmonary outflow tract measures 3.0 cm in diameter, mildly prominent. Mediastinum/Nodes: Thyroid appears unremarkable. There is no thoracic adenopathy. There is a small hiatal hernia. Lungs/Pleura: There are areas of patchy atelectatic change in both lower lobes and in the inferior lingular region. There is no edema or consolidation appreciable. No pleural effusion or pleural thickening evident. Upper Abdomen: Visualized upper abdominal structures appear unremarkable. Musculoskeletal: There is midthoracic dextroscoliosis. There are no blastic or lytic bone lesions. There is no evident chest wall lesion. Review of the MIP images confirms the above findings. IMPRESSION: 1. No demonstrable pulmonary embolus. No thoracic aortic aneurysm. No dissection seen. Contrast bolus is less than optimal for dissection  assessment. There is aortic atherosclerosis. 2. Prominence of the main pulmonary outflow tract. Suspect a degree of pulmonary arterial hypertension. 3. Areas of atelectatic change in  the lower lobes and inferior lingula. No frank edema or consolidation. 4.  No appreciable thoracic adenopathy. 5.  Small hiatal hernia. Aortic Atherosclerosis (ICD10-I70.0). Electronically Signed   By: Lowella Grip III M.D.   On: 09/07/2017 08:17    ____________________________________________   PROCEDURES  Procedure(s) performed: None  Procedures  Critical Care performed: No  ____________________________________________   INITIAL IMPRESSION / ASSESSMENT AND PLAN / ED COURSE  As part of my medical decision making, I reviewed the following data within the electronic MEDICAL RECORD NUMBER Notes from prior ED visits and [place] Controlled Substance Database   This is a 67 year old female who comes into the hospital today with some chest pain, arm numbness, shortness of breath.  The patient seems sleepy but states that this started today.  My differential diagnosis includes acute coronary syndrome, electrolyte abnormality, arrhythmia  I did check some blood work on the patient to include a CBC, BMP and a troponin.  The patient's creatinine is 1.72 which is slightly elevated from previous.  The patient's chest x-ray is negative.  I will also add a d-dimer and a CT of the patient's head given her hand and tongue numbness.  The patient is sleeping comfortably at this time and although she claims that her pain is an 8 out of 10 she is resting.  I will also give her a GI cocktail as this did start while she was eating.  She will be reassessed once I received all of her results.  The patient had a CT head given her tingling and she also had a CT of her chest that she did have elevated d-dimer.  The patient's work-up is unremarkable.  I went back into the room and the patient was sleeping but when I woke her up she said she  still felt little dizzy and had some chest pain.  I will give the patient a shot of Toradol and a liter of normal saline.  She will be discharged home to follow-up with her primary care physician.      ____________________________________________   FINAL CLINICAL IMPRESSION(S) / ED DIAGNOSES  Final diagnoses:  Chest pain, unspecified type  Paresthesia  Shortness of breath  Dizziness     ED Discharge Orders    None       Note:  This document was prepared using Dragon voice recognition software and may include unintentional dictation errors.    Loney Hering, MD 09/07/17 [phone]

## 2017-09-07 NOTE — ED Triage Notes (Signed)
EMS pt to rm 13 from home with report of chest pain 1.5hrs.

## 2017-09-07 NOTE — ED Notes (Addendum)

## 2017-09-07 NOTE — Discharge Instructions (Signed)
Please follow up with your primary care physician for further evaluations of your symptoms.  Please return with any worsening conditions or any other concerns.

## 2017-09-07 NOTE — ED Notes (Signed)
Patient resting quietly at this time with eyes closed in no acute distress at this time.

## 2017-09-07 NOTE — ED Notes (Signed)
20G PIV from EMS removed from R AC d/t infiltration.

## 2017-09-10 DIAGNOSIS — N183 Chronic kidney disease, stage 3 (moderate): Secondary | ICD-10-CM | POA: Diagnosis not present

## 2017-09-10 DIAGNOSIS — D631 Anemia in chronic kidney disease: Secondary | ICD-10-CM | POA: Diagnosis not present

## 2017-09-10 DIAGNOSIS — I10 Essential (primary) hypertension: Secondary | ICD-10-CM | POA: Diagnosis not present

## 2017-09-10 DIAGNOSIS — N2581 Secondary hyperparathyroidism of renal origin: Secondary | ICD-10-CM | POA: Diagnosis not present

## 2017-09-14 ENCOUNTER — Encounter: Payer: Self-pay | Admitting: Family Medicine

## 2017-09-14 ENCOUNTER — Ambulatory Visit (INDEPENDENT_AMBULATORY_CARE_PROVIDER_SITE_OTHER): Payer: Medicare Other | Admitting: Family Medicine

## 2017-09-14 VITALS — BP 111/69 | HR 81 | Resp 16 | Ht 71.0 in | Wt 245.0 lb

## 2017-09-14 DIAGNOSIS — H8309 Labyrinthitis, unspecified ear: Secondary | ICD-10-CM | POA: Diagnosis not present

## 2017-09-14 DIAGNOSIS — E860 Dehydration: Secondary | ICD-10-CM

## 2017-09-14 MED ORDER — MECLIZINE HCL 12.5 MG PO TABS: 12.5000 mg | ORAL_TABLET | Freq: Two times a day (BID) | ORAL | 0 refills | Status: DC | PRN

## 2017-09-14 NOTE — Progress Notes (Signed)
Name: Michele Moore   MRN: [id number]    DOB: 08-21-1950   Date:09/14/2017       Progress Note  Subjective  Chief Complaint  Chief Complaint  Patient presents with  . Fatigue    chest pain /fatigue in ED 09/07/2017   . Dizziness    Needs refill on Meclizine dx Vertigo     Dizziness  This is a new problem. The problem occurs intermittently. The problem has been gradually improving. Associated symptoms include vertigo. Pertinent negatives include no abdominal pain, anorexia, arthralgias, change in bowel habit, chest pain, chills, congestion, coughing, diaphoresis, fatigue, fever, headaches, joint swelling, myalgias, nausea, neck pain, numbness, rash, sore throat, swollen glands, urinary symptoms, visual change, vomiting or weakness. The symptoms are aggravated by exertion. Treatments tried: fluids/meclizine. The treatment provided mild relief.    No problem-specific Assessment & Plan notes found for this encounter.   Past Medical History:  Diagnosis Date  . Anemia   . Arthritis   . Chronic kidney disease    STAGE 3 PER DR EASON 08/02/15  . Collagen vascular disease ([place])   . Family history of adverse reaction to anesthesia    sister difficult to put to sleep  . GERD (gastroesophageal reflux disease)   . H/O tooth extraction    all lower teeth 1/19  . Hemorrhoid   . History of hiatal hernia   . Hypertension   . Migraines   . Mixed hyperlipidemia   . Multiple gastric ulcers   . Thyroid disease     Past Surgical History:  Procedure Laterality Date  . ANKLE SURGERY    . CARPAL TUNNEL RELEASE     x3  . COLONOSCOPY  2000?  . FUSION OF TALONAVICULAR JOINT Right 08/03/2015   Procedure: TAILOR NAVICULAR JOINT FUSION - RIGHT ;  Surgeon: [name], DPM;  Location: [facility] ORS;  Service: Podiatry;  Laterality: Right;  . JOINT REPLACEMENT     knee x 3 ,right x1 and left x2  . ROTATOR CUFF REPAIR    . TONSILLECTOMY    . TUBAL LIGATION    . UPPER GI ENDOSCOPY  2000?    Family  History  Problem Relation Age of Onset  . COPD Sister   . Cancer Maternal Aunt        breast  . Cancer Maternal Uncle        kidney    Social History   Socioeconomic History  . Marital status: Divorced    Spouse name: Not on file  . Number of children: Not on file  . Years of education: Not on file  . Highest education level: Not on file  Occupational History  . Not on file  Social Needs  . Financial resource strain: Not on file  . Food insecurity:    Worry: Not on file    Inability: Not on file  . Transportation needs:    Medical: Not on file    Non-medical: Not on file  Tobacco Use  . Smoking status: Former Smoker    Last attempt to quit: 02/24/1993    Years since quitting: 24.5  . Smokeless tobacco: Never Used  Substance and Sexual Activity  . Alcohol use: No    Alcohol/week: 0.0 oz  . Drug use: No  . Sexual activity: Not on file  Lifestyle  . Physical activity:    Days per week: Not on file    Minutes per session: Not on file  . Stress: Not on file  Relationships  . Social connections:    Talks on phone: Not on file    Gets together: Not on file    Attends religious service: Not on file    Active member of club or organization: Not on file    Attends meetings of clubs or organizations: Not on file    Relationship status: Not on file  . Intimate partner violence:    Fear of current or ex partner: Not on file    Emotionally abused: Not on file    Physically abused: Not on file    Forced sexual activity: Not on file  Other Topics Concern  . Not on file  Social History Narrative  . Not on file    Allergies  Allergen Reactions  . Penicillins Anaphylaxis and Swelling    Has patient had a PCN reaction causing immediate rash, facial/tongue/throat swelling, SOB or lightheadedness with hypotension: Yes Has patient had a PCN reaction causing severe rash involving mucus membranes or skin necrosis: No Has patient had a PCN reaction that required hospitalization:  No Has patient had a PCN reaction occurring within the last 10 years: Yes If all of the above answers are "NO", then may proceed with Cephalosporin use.  . Vibramycin [Doxycycline Calcium] Rash    Outpatient Medications Prior to Visit  Medication Sig Dispense Refill  . albuterol (PROVENTIL HFA;VENTOLIN HFA) 108 (90 Base) MCG/ACT inhaler Inhale 2 puffs into the lungs every 4 (four) hours as needed for wheezing.    Marland Kitchen allopurinol (ZYLOPRIM) 100 MG tablet Take 2 tablets by mouth daily. Dr Barb Merino    . aspirin EC 81 MG tablet Take 81 mg by mouth daily.    . Cholecalciferol (VITAMIN D3) 2000 units TABS Take 2,000 Units by mouth daily.    . diclofenac sodium (VOLTAREN) 1 % GEL Apply 2 g topically 3 (three) times daily. [facility]    . ferrous sulfate 325 (65 FE) MG tablet Take 325 mg by mouth daily with breakfast. otc    . fexofenadine-pseudoephedrine (ALLEGRA-D 24) 180-240 MG 24 hr tablet Take 1 tablet by mouth daily as needed (allergies/sinus pressure).     . fluticasone (FLONASE) 50 MCG/ACT nasal spray Place 1 spray into both nostrils daily. 16 g 2  . gabapentin (NEURONTIN) 300 MG capsule TAKE 2 CAPSULES BY MOUTH NIGHTLY/ Lateef (Patient taking differently: Take 600 mg by mouth at bedtime. ) 60 capsule 3  . hydrALAZINE (APRESOLINE) 50 MG tablet TAKE 1 TABLET BY MOUTH TWICE DAILY 60 tablet 0  . HYDROcodone-acetaminophen (NORCO) 7.5-325 MG tablet Take 1 tablet by mouth 3 (three) times daily as needed for severe pain. For Chronic Pain DNF: 08/24/17, 10/23/17, 11/22/17 90 tablet 0  . ketoconazole (NIZORAL) 2 % cream U UTD QHS (Patient taking differently: Apply 1 application topically at bedtime. U UTD QHS) 30 g 0  . levothyroxine (SYNTHROID) 200 MCG tablet Take 1 tablet (200 mcg total) by mouth daily before breakfast. 30 tablet 5  . Multiple Vitamins-Calcium (ONE-A-DAY WOMENS FORMULA PO) Take 1 tablet by mouth daily.    . mupirocin cream (BACTROBAN) 2 % Apply 1 application topically 2 (two)  times daily. (Patient taking differently: Apply 1 application topically 2 (two) times daily as needed (irritation). ) 15 g 0  . nystatin cream (MYCOSTATIN) Apply 1 application topically 2 (two) times daily. 30 g 0  . omega-3 acid ethyl esters (LOVAZA) 1 g capsule TAKE 2 CAPSULES BY MOUTH TWICE DAILY 120 capsule 0  . omeprazole (PRILOSEC) 40  MG capsule Take 1 capsule (40 mg total) by mouth daily. 30 capsule 5  . Polyethyl Glycol-Propyl Glycol (SYSTANE OP) Place 1 drop into both eyes daily as needed (dry eyes).     . polyethylene glycol (MIRALAX / GLYCOLAX) packet Take 17 g by mouth daily. (Patient taking differently: Take 17 g by mouth daily as needed for mild constipation. ) 14 each 0  . potassium chloride SA (K-DUR,KLOR-CON) 20 MEQ tablet Take 1 tablet (20 mEq total) by mouth daily. 30 tablet 5  . ranitidine (ZANTAC) 150 MG tablet Take 1 tablet (150 mg total) by mouth 2 (two) times daily. 60 tablet 5  . tiZANidine (ZANAFLEX) 4 MG capsule Take 1 capsule (4 mg total) by mouth 2 (two) times daily as needed. (Patient taking differently: Take 4 mg by mouth 2 (two) times daily as needed for muscle spasms. ) 60 capsule 2  . triamterene-hydrochlorothiazide (MAXZIDE) 75-50 MG tablet Take 1 tablet by mouth daily. 30 tablet 5  . meclizine (ANTIVERT) 12.5 MG tablet Take 1 tablet (12.5 mg total) by mouth 2 (two) times daily as needed for dizziness. 20 tablet 0  . sulfamethoxazole-trimethoprim (BACTRIM DS,SEPTRA DS) 800-160 MG tablet Take 1 tablet by mouth 2 (two) times daily. 14 tablet 0   No facility-administered medications prior to visit.     Review of Systems  Constitutional: Negative for chills, diaphoresis, fatigue, fever, malaise/fatigue and weight loss.  HENT: Negative for congestion, ear discharge, ear pain and sore throat.   Eyes: Negative for blurred vision.  Respiratory: Negative for cough, sputum production, shortness of breath and wheezing.   Cardiovascular: Negative for chest pain,  palpitations and leg swelling.  Gastrointestinal: Negative for abdominal pain, anorexia, blood in stool, change in bowel habit, constipation, diarrhea, heartburn, melena, nausea and vomiting.  Genitourinary: Negative for dysuria, frequency, hematuria and urgency.  Musculoskeletal: Negative for arthralgias, back pain, joint pain, joint swelling, myalgias and neck pain.  Skin: Negative for rash.  Neurological: Positive for dizziness and vertigo. Negative for tingling, sensory change, focal weakness, weakness, numbness and headaches.  Endo/Heme/Allergies: Negative for environmental allergies and polydipsia. Does not bruise/bleed easily.  Psychiatric/Behavioral: Negative for depression and suicidal ideas. The patient is not nervous/anxious and does not have insomnia.      Objective  Vitals:   09/14/17 1537  BP: 111/69  Pulse: 81  Resp: 16  SpO2: 98%  Weight: 245 lb (111.1 kg)  Height: 5\' 11"  (1.803 m)    Physical Exam  Constitutional: She is oriented to person, place, and time. She appears well-developed and well-nourished.  HENT:  Head: Normocephalic.  Right Ear: External ear normal.  Left Ear: External ear normal.  Mouth/Throat: Oropharynx is clear and moist.  Eyes: Pupils are equal, round, and reactive to light. Conjunctivae and EOM are normal. Lids are everted and swept, no foreign bodies found. Left eye exhibits no hordeolum. No foreign body present in the left eye. Right conjunctiva is not injected. Left conjunctiva is not injected. No scleral icterus.  Neck: Normal range of motion. Neck supple. No JVD present. No tracheal deviation present. No thyromegaly present.  Cardiovascular: Normal rate, regular rhythm, normal heart sounds and intact distal pulses. Exam reveals no gallop and no friction rub.  No murmur heard. Pulmonary/Chest: Effort normal and breath sounds normal. No respiratory distress. She has no wheezes. She has no rales.  Abdominal: Soft. Bowel sounds are normal. She  exhibits no mass. There is no hepatosplenomegaly. There is no tenderness. There is no rebound and no  guarding.  Musculoskeletal: Normal range of motion. She exhibits no edema or tenderness.  Lymphadenopathy:    She has no cervical adenopathy.  Neurological: She is alert and oriented to person, place, and time. She has normal strength. She displays normal reflexes. No cranial nerve deficit.  Skin: Skin is warm. No rash noted.  Psychiatric: She has a normal mood and affect. Her mood appears not anxious. She does not exhibit a depressed mood.  Nursing note and vitals reviewed.     Assessment & Plan  Problem List Items Addressed This Visit    None    Visit Diagnoses    Dehydration    -  Primary   Followup from er for dehydration.  Weight maintain at 245 lbs. Continue fluids and will rechech labs and refill medications in 2 weeks.   Labyrinthitis, unspecified laterality       Episodic without nausea. Will refilll meclazine 12.5 mg for prn vertigo.   Relevant Medications   meclizine (ANTIVERT) 12.5 MG tablet      Meds ordered this encounter  Medications  . meclizine (ANTIVERT) 12.5 MG tablet    Sig: Take 1 tablet (12.5 mg total) by mouth 2 (two) times daily as needed for dizziness.    Dispense:  30 tablet    Refill:  0      Dr. Jericka Kadar [facility]  09/14/17

## 2017-09-21 ENCOUNTER — Telehealth: Payer: Self-pay | Admitting: Family Medicine

## 2017-09-21 NOTE — Telephone Encounter (Signed)
Called to schedule Medicare Annual Wellness Visit with Nurse Health Advisor. If patient returns call, please schedule AWV with NHA any date  °Thank you! °For any questions please contact: °Kathryn Brown [phone]  °Or Skype me at: [email or web address]  ° ° °

## 2017-09-22 ENCOUNTER — Other Ambulatory Visit: Payer: Self-pay | Admitting: Family Medicine

## 2017-09-22 DIAGNOSIS — I10 Essential (primary) hypertension: Secondary | ICD-10-CM

## 2017-09-23 ENCOUNTER — Encounter: Payer: Self-pay | Admitting: Gastroenterology

## 2017-09-23 ENCOUNTER — Ambulatory Visit (INDEPENDENT_AMBULATORY_CARE_PROVIDER_SITE_OTHER): Payer: Medicare Other | Admitting: Gastroenterology

## 2017-09-23 ENCOUNTER — Other Ambulatory Visit: Payer: Self-pay

## 2017-09-23 VITALS — BP 115/63 | HR 58 | Ht 71.0 in | Wt 248.0 lb

## 2017-09-23 DIAGNOSIS — D500 Iron deficiency anemia secondary to blood loss (chronic): Secondary | ICD-10-CM | POA: Diagnosis not present

## 2017-09-23 DIAGNOSIS — R195 Other fecal abnormalities: Secondary | ICD-10-CM

## 2017-09-23 NOTE — Progress Notes (Signed)
Gastroenterology Consultation  Referring Provider:     Juline Patch, MD Primary Care Physician:  Juline Patch, MD Primary Gastroenterologist:  Dr. Allen Norris     Reason for Consultation:     Positive stools and anemia        HPI:   Michele Moore is a 67 y.o. y/o female referred for consultation & management of heme positive stools and anemia by Dr. Ronnald Ramp, Iven Finn, MD.  This patient comes in today after being found to have heme positive stools.  The patient denies ever having been told she had GI bleeding although she reports that she has problems with hemorrhoids.  The patient also has been started on iron and states that she has black stools which also make her feel constipated.  The patient has been having constipation that she treats with MiraLAX.  There is no report of any unexplained weight loss fevers chills nausea or vomiting.  The patient also denies any family history of colon cancer or colon polyps. The patient had an EGD and colonoscopy back in 2016 and at that time it was done by Dr. Jamal Collin.  Nothing was reported to be abnormal in the colon at that time. There is no report of any abdominal pain.  She does report that she had been told many years ago that she had gastric ulcers.  Past Medical History:  Diagnosis Date  . Anemia   . Arthritis   . Chronic kidney disease    STAGE 3 PER DR EASON 08/02/15  . Collagen vascular disease ([place])   . Family history of adverse reaction to anesthesia    sister difficult to put to sleep  . GERD (gastroesophageal reflux disease)   . H/O tooth extraction    all lower teeth 1/19  . Hemorrhoid   . History of hiatal hernia   . Hypertension   . Migraines   . Mixed hyperlipidemia   . Multiple gastric ulcers   . Thyroid disease     Past Surgical History:  Procedure Laterality Date  . ANKLE SURGERY    . CARPAL TUNNEL RELEASE     x3  . COLONOSCOPY  2000?  . FUSION OF TALONAVICULAR JOINT Right 08/03/2015   Procedure: TAILOR NAVICULAR JOINT  FUSION - RIGHT ;  Surgeon: [name], DPM;  Location: [facility] ORS;  Service: Podiatry;  Laterality: Right;  . JOINT REPLACEMENT     knee x 3 ,right x1 and left x2  . ROTATOR CUFF REPAIR    . TONSILLECTOMY    . TUBAL LIGATION    . UPPER GI ENDOSCOPY  2000?    Prior to Admission medications   Medication Sig Start Date End Date Taking? Authorizing Provider  albuterol (PROVENTIL HFA;VENTOLIN HFA) 108 (90 Base) MCG/ACT inhaler Inhale 2 puffs into the lungs every 4 (four) hours as needed for wheezing.   Yes [provider]  allopurinol (ZYLOPRIM) 100 MG tablet Take 2 tablets by mouth daily. Dr Barb Merino 08/06/17  Yes [provider]  aspirin EC 81 MG tablet Take 81 mg by mouth daily.   Yes [provider]  Cholecalciferol (VITAMIN D3) 2000 units TABS Take 2,000 Units by mouth daily.   Yes [provider]  diclofenac sodium (VOLTAREN) 1 % GEL Apply 2 g topically 3 (three) times daily. [facility] 08/11/17  Yes [provider]  ferrous sulfate 325 (65 FE) MG tablet Take 325 mg by mouth daily with breakfast. otc   Yes [provider]  fexofenadine-pseudoephedrine (ALLEGRA-D 24) 180-240 MG 24 hr tablet Take 1 tablet by mouth daily as needed (allergies/sinus pressure).    Yes [provider]  fluticasone (FLONASE) 50 MCG/ACT nasal spray Place 1 spray into both nostrils daily. 06/09/17 06/09/18 Yes Fisher, Linden Dolin, PA-C  gabapentin (NEURONTIN) 300 MG capsule TAKE 2 CAPSULES BY MOUTH NIGHTLY/ Lateef Patient taking differently: Take 600 mg by mouth at bedtime.  08/06/17  Yes Gillis Santa, MD  hydrALAZINE (APRESOLINE) 50 MG tablet TAKE 1 TABLET BY MOUTH TWICE DAILY 09/23/17  Yes Juline Patch, MD  HYDROcodone-acetaminophen (NORCO) 7.5-325 MG tablet Take 1 tablet by mouth 3 (three) times daily as needed for severe pain. For Chronic Pain DNF: 08/24/17, 10/23/17, 11/22/17 08/06/17  Yes Gillis Santa, MD  ketoconazole (NIZORAL) 2 % cream U UTD  QHS Patient taking differently: Apply 1 application topically at bedtime. U UTD QHS 08/18/17  Yes Juline Patch, MD  levothyroxine (SYNTHROID) 200 MCG tablet Take 1 tablet (200 mcg total) by mouth daily before breakfast. 12/23/16  Yes Juline Patch, MD  meclizine (ANTIVERT) 12.5 MG tablet Take 1 tablet (12.5 mg total) by mouth 2 (two) times daily as needed for dizziness. 09/14/17  Yes Juline Patch, MD  Multiple Vitamins-Calcium (ONE-A-DAY WOMENS FORMULA PO) Take 1 tablet by mouth daily.   Yes [provider]  mupirocin cream (BACTROBAN) 2 % Apply 1 application topically 2 (two) times daily. Patient taking differently: Apply 1 application topically 2 (two) times daily as needed (irritation).  07/05/16  Yes Letitia Neri L, PA-C  nystatin cream (MYCOSTATIN) Apply 1 application topically 2 (two) times daily. 08/18/17  Yes Juline Patch, MD  omega-3 acid ethyl esters (LOVAZA) 1 g capsule TAKE 2 CAPSULES BY MOUTH TWICE DAILY 06/24/17  Yes Juline Patch, MD  omeprazole (PRILOSEC) 40 MG capsule Take 1 capsule (40 mg total) by mouth daily. 12/23/16  Yes Juline Patch, MD  Polyethyl Glycol-Propyl Glycol (SYSTANE OP) Place 1 drop into both eyes daily as needed (dry eyes).    Yes [provider]  polyethylene glycol (MIRALAX / GLYCOLAX) packet Take 17 g by mouth daily. Patient taking differently: Take 17 g by mouth daily as needed for mild constipation.  08/07/15  Yes [name], DPM  potassium chloride SA (K-DUR,KLOR-CON) 20 MEQ tablet Take 1 tablet (20 mEq total) by mouth daily. 12/23/16  Yes Juline Patch, MD  ranitidine (ZANTAC) 150 MG tablet Take 1 tablet (150 mg total) by mouth 2 (two) times daily. 12/23/16  Yes Juline Patch, MD  tiZANidine (ZANAFLEX) 4 MG capsule Take 1 capsule (4 mg total) by mouth 2 (two) times daily as needed. Patient taking differently: Take 4 mg by mouth 2 (two) times daily as needed for muscle spasms.  01/22/17  Yes Gillis Santa, MD   triamterene-hydrochlorothiazide (MAXZIDE) 75-50 MG tablet Take 1 tablet by mouth daily. 12/23/16  Yes Juline Patch, MD    Family History  Problem Relation Age of Onset  . COPD Sister   . Cancer Maternal Aunt        breast  . Cancer Maternal Uncle        kidney     Social History   Tobacco Use  . Smoking status: Former Smoker    Last attempt to quit: 02/24/1993    Years since quitting: 24.5  . Smokeless tobacco: Never Used  Substance Use Topics  . Alcohol use: No    Alcohol/week: 0.0 oz  .  Drug use: No    Allergies as of 09/23/2017 - Review Complete 09/23/2017  Allergen Reaction Noted  . Penicillins Anaphylaxis and Swelling 04/19/2014  . Vibramycin [doxycycline calcium] Rash 04/19/2014    Review of Systems:    All systems reviewed and negative except where noted in HPI.   Physical Exam:  BP 115/63   Pulse (!) 58   Ht 5\' 11"  (1.803 m)   Wt 248 lb (112.5 kg)   BMI 34.59 kg/m  No LMP recorded. Patient is postmenopausal. General:   Alert,  Well-developed, well-nourished, pleasant and cooperative in NAD Head:  Normocephalic and atraumatic. Eyes:  Sclera clear, no icterus.   Conjunctiva pink. Ears:  Normal auditory acuity. Nose:  No deformity, discharge, or lesions. Mouth:  No deformity or lesions,oropharynx pink & moist. Neck:  Supple; no masses or thyromegaly. Lungs:  Respirations even and unlabored.  Clear throughout to auscultation.   No wheezes, crackles, or rhonchi. No acute distress. Heart:  Regular rate and rhythm; no murmurs, clicks, rubs, or gallops. Abdomen:  Normal bowel sounds.  No bruits.  Soft, non-tender and non-distended without masses, hepatosplenomegaly or hernias noted.  No guarding or rebound tenderness.  Negative Carnett sign.   Rectal:  Deferred.  Msk:  Symmetrical without gross deformities.  Good, equal movement & strength bilaterally. Pulses:  Normal pulses noted. Extremities:  No clubbing or edema.  No cyanosis. Neurologic:  Alert and  oriented x3;  grossly normal neurologically. Skin:  Intact without significant lesions or rashes.  No jaundice. Lymph Nodes:  No significant cervical adenopathy. Psych:  Alert and cooperative. Normal mood and affect.  Imaging Studies: Dg Chest 2 View  Result Date: 09/07/2017 CLINICAL DATA:  67 year old female with chest pain and dizziness. EXAM: CHEST - 2 VIEW COMPARISON:  Chest radiograph dated 06/09/2017 FINDINGS: The heart size and mediastinal contours are within normal limits. Both lungs are clear. The visualized skeletal structures are unremarkable. IMPRESSION: No active cardiopulmonary disease. Electronically Signed   By: Anner Crete M.D.   On: 09/07/2017 02:47   Ct Head Wo Contrast  Result Date: 09/07/2017 CLINICAL DATA:  Orthostatic dizziness EXAM: CT HEAD WITHOUT CONTRAST TECHNIQUE: Contiguous axial images were obtained from the base of the skull through the vertex without intravenous contrast. COMPARISON:  Head CT 05/13/2017 FINDINGS: Brain: There is no mass, hemorrhage or extra-axial collection. The size and configuration of the ventricles and extra-axial CSF spaces are normal. There is no acute or chronic infarction. The brain parenchyma is normal. Vascular: No abnormal hyperdensity of the major intracranial arteries or dural venous sinuses. No intracranial atherosclerosis. Skull: The visualized skull base, calvarium and extracranial soft tissues are normal. Sinuses/Orbits: No fluid levels or advanced mucosal thickening of the visualized paranasal sinuses. No mastoid or middle ear effusion. The orbits are normal. IMPRESSION: Normal brain. Electronically Signed   By: Ulyses Jarred M.D.   On: 09/07/2017 06:11   Ct Angio Chest Pe W And/or Wo Contrast  Result Date: 09/07/2017 CLINICAL DATA:  Chest pain for several days EXAM: CT ANGIOGRAPHY CHEST WITH CONTRAST TECHNIQUE: Multidetector CT imaging of the chest was performed using the standard protocol during bolus administration of  intravenous contrast. Multiplanar CT image reconstructions and MIPs were obtained to evaluate the vascular anatomy. CONTRAST:  30mL ISOVUE-370 IOPAMIDOL (ISOVUE-370) INJECTION 76% COMPARISON:  CT angiogram chest December 27, 2013; chest radiograph September 07, 2017 FINDINGS: Cardiovascular: There is no evident pulmonary embolus. There is no thoracic aortic aneurysm. No dissection evident. The contrast bolus is somewhat less  than optimal for assessment for dissection by radiography. Visualized great vessels appear normal. There is apparent common origin of the right innominate and left common carotid arteries. There are foci of aortic atherosclerosis. There is no appreciable pericardial effusion or pericardial thickening. The main pulmonary outflow tract measures 3.0 cm in diameter, mildly prominent. Mediastinum/Nodes: Thyroid appears unremarkable. There is no thoracic adenopathy. There is a small hiatal hernia. Lungs/Pleura: There are areas of patchy atelectatic change in both lower lobes and in the inferior lingular region. There is no edema or consolidation appreciable. No pleural effusion or pleural thickening evident. Upper Abdomen: Visualized upper abdominal structures appear unremarkable. Musculoskeletal: There is midthoracic dextroscoliosis. There are no blastic or lytic bone lesions. There is no evident chest wall lesion. Review of the MIP images confirms the above findings. IMPRESSION: 1. No demonstrable pulmonary embolus. No thoracic aortic aneurysm. No dissection seen. Contrast bolus is less than optimal for dissection assessment. There is aortic atherosclerosis. 2. Prominence of the main pulmonary outflow tract. Suspect a degree of pulmonary arterial hypertension. 3. Areas of atelectatic change in the lower lobes and inferior lingula. No frank edema or consolidation. 4.  No appreciable thoracic adenopathy. 5.  Small hiatal hernia. Aortic Atherosclerosis (ICD10-I70.0). Electronically Signed   By: Lowella Grip III M.D.   On: 09/07/2017 08:17   Dg C-arm 1-60 Min-no Report  Result Date: 08/31/2017 Fluoroscopy was utilized by the requesting physician.  No radiographic interpretation.    Assessment and Plan:   Michele Moore is a 67 y.o. y/o female with heme positive stools and iron deficient anemia.  The patient will be set up for an EGD and colonoscopy to look for source of the patient's iron deficiency anemia and heme positive stools. I have discussed risks & benefits which include, but are not limited to, bleeding, infection, perforation & drug reaction.  The patient agrees with this plan & written consent will be obtained.     Lucilla Lame, MD. Marval Regal    Note: This dictation was prepared with Dragon dictation along with smaller phrase technology. Any transcriptional errors that result from this process are unintentional.

## 2017-09-24 ENCOUNTER — Other Ambulatory Visit: Payer: Self-pay

## 2017-09-24 DIAGNOSIS — R195 Other fecal abnormalities: Secondary | ICD-10-CM

## 2017-09-24 DIAGNOSIS — D500 Iron deficiency anemia secondary to blood loss (chronic): Secondary | ICD-10-CM

## 2017-09-25 ENCOUNTER — Other Ambulatory Visit: Payer: Self-pay | Admitting: Family Medicine

## 2017-09-25 DIAGNOSIS — E876 Hypokalemia: Secondary | ICD-10-CM

## 2017-09-28 ENCOUNTER — Ambulatory Visit: Payer: Medicare Other | Admitting: Student in an Organized Health Care Education/Training Program

## 2017-09-28 DIAGNOSIS — Z96653 Presence of artificial knee joint, bilateral: Secondary | ICD-10-CM | POA: Diagnosis not present

## 2017-09-28 DIAGNOSIS — R21 Rash and other nonspecific skin eruption: Secondary | ICD-10-CM | POA: Diagnosis not present

## 2017-09-28 DIAGNOSIS — E669 Obesity, unspecified: Secondary | ICD-10-CM | POA: Diagnosis not present

## 2017-09-28 DIAGNOSIS — R768 Other specified abnormal immunological findings in serum: Secondary | ICD-10-CM | POA: Diagnosis not present

## 2017-09-28 DIAGNOSIS — M1A00X0 Idiopathic chronic gout, unspecified site, without tophus (tophi): Secondary | ICD-10-CM | POA: Diagnosis not present

## 2017-09-28 DIAGNOSIS — M47812 Spondylosis without myelopathy or radiculopathy, cervical region: Secondary | ICD-10-CM | POA: Diagnosis not present

## 2017-09-29 ENCOUNTER — Encounter: Payer: Self-pay | Admitting: Student in an Organized Health Care Education/Training Program

## 2017-09-29 ENCOUNTER — Other Ambulatory Visit: Payer: Self-pay

## 2017-09-29 ENCOUNTER — Ambulatory Visit
Payer: Medicare Other | Attending: Student in an Organized Health Care Education/Training Program | Admitting: Student in an Organized Health Care Education/Training Program

## 2017-09-29 VITALS — BP 146/80 | HR 74 | Temp 98.3°F | Resp 18 | Ht 71.0 in | Wt 228.0 lb

## 2017-09-29 DIAGNOSIS — M5136 Other intervertebral disc degeneration, lumbar region: Secondary | ICD-10-CM

## 2017-09-29 DIAGNOSIS — Z87891 Personal history of nicotine dependence: Secondary | ICD-10-CM | POA: Insufficient documentation

## 2017-09-29 DIAGNOSIS — M5416 Radiculopathy, lumbar region: Secondary | ICD-10-CM | POA: Diagnosis not present

## 2017-09-29 DIAGNOSIS — M5116 Intervertebral disc disorders with radiculopathy, lumbar region: Secondary | ICD-10-CM | POA: Insufficient documentation

## 2017-09-29 DIAGNOSIS — M48062 Spinal stenosis, lumbar region with neurogenic claudication: Secondary | ICD-10-CM | POA: Diagnosis not present

## 2017-09-29 DIAGNOSIS — G43909 Migraine, unspecified, not intractable, without status migrainosus: Secondary | ICD-10-CM | POA: Diagnosis not present

## 2017-09-29 DIAGNOSIS — K219 Gastro-esophageal reflux disease without esophagitis: Secondary | ICD-10-CM | POA: Diagnosis not present

## 2017-09-29 DIAGNOSIS — Z79899 Other long term (current) drug therapy: Secondary | ICD-10-CM | POA: Diagnosis not present

## 2017-09-29 DIAGNOSIS — G894 Chronic pain syndrome: Secondary | ICD-10-CM | POA: Diagnosis not present

## 2017-09-29 DIAGNOSIS — Z6835 Body mass index (BMI) 35.0-35.9, adult: Secondary | ICD-10-CM | POA: Diagnosis not present

## 2017-09-29 DIAGNOSIS — E782 Mixed hyperlipidemia: Secondary | ICD-10-CM | POA: Insufficient documentation

## 2017-09-29 DIAGNOSIS — Z79890 Hormone replacement therapy (postmenopausal): Secondary | ICD-10-CM | POA: Diagnosis not present

## 2017-09-29 DIAGNOSIS — E079 Disorder of thyroid, unspecified: Secondary | ICD-10-CM | POA: Insufficient documentation

## 2017-09-29 DIAGNOSIS — M19011 Primary osteoarthritis, right shoulder: Secondary | ICD-10-CM | POA: Diagnosis not present

## 2017-09-29 DIAGNOSIS — I700 Atherosclerosis of aorta: Secondary | ICD-10-CM | POA: Diagnosis not present

## 2017-09-29 DIAGNOSIS — K449 Diaphragmatic hernia without obstruction or gangrene: Secondary | ICD-10-CM | POA: Insufficient documentation

## 2017-09-29 DIAGNOSIS — M542 Cervicalgia: Secondary | ICD-10-CM | POA: Insufficient documentation

## 2017-09-29 DIAGNOSIS — I129 Hypertensive chronic kidney disease with stage 1 through stage 4 chronic kidney disease, or unspecified chronic kidney disease: Secondary | ICD-10-CM | POA: Diagnosis not present

## 2017-09-29 DIAGNOSIS — Z7951 Long term (current) use of inhaled steroids: Secondary | ICD-10-CM | POA: Diagnosis not present

## 2017-09-29 DIAGNOSIS — M545 Low back pain: Secondary | ICD-10-CM | POA: Diagnosis present

## 2017-09-29 DIAGNOSIS — E669 Obesity, unspecified: Secondary | ICD-10-CM | POA: Insufficient documentation

## 2017-09-29 DIAGNOSIS — Z7982 Long term (current) use of aspirin: Secondary | ICD-10-CM | POA: Diagnosis not present

## 2017-09-29 DIAGNOSIS — N189 Chronic kidney disease, unspecified: Secondary | ICD-10-CM | POA: Diagnosis not present

## 2017-09-29 MED ORDER — HYDROCODONE-ACETAMINOPHEN 7.5-325 MG PO TABS: 1.0000 | ORAL_TABLET | Freq: Three times a day (TID) | ORAL | 0 refills | Status: DC | PRN

## 2017-09-29 NOTE — Patient Instructions (Signed)
You have been given 3 scripts for hydrocodone today   Epidural Steroid Injection An epidural steroid injection is given to relieve pain in your neck, back, or legs that is caused by the irritation or swelling of a nerve root. This procedure involves injecting a steroid and numbing medicine (anesthetic) into the epidural space. The epidural space is the space between the outer covering of your spinal cord and the bones that form your backbone (vertebra).  LET [facility] CARE PROVIDER KNOW ABOUT:   Any allergies you have.  All medicines you are taking, including vitamins, herbs, eye drops, creams, and over-the-counter medicines such as aspirin.  Previous problems you or members of your family have had with the use of anesthetics.  Any blood disorders or blood clotting disorders you have.  Previous surgeries you have had.  Medical conditions you have.  RISKS AND COMPLICATIONS Generally, this is a safe procedure. However, as with any procedure, complications can occur. Possible complications of epidural steroid injection include:  Headache.  Bleeding.  Infection.  Allergic reaction to the medicines.  Damage to your nerves. The response to this procedure depends on the underlying cause of the pain and its duration. People who have long-term (chronic) pain are less likely to benefit from epidural steroids than are those people whose pain comes on strong and suddenly.  BEFORE THE PROCEDURE   Ask your health care provider about changing or stopping your regular medicines. You may be advised to stop taking blood-thinning medicines a few days before the procedure.  You may be given medicines to reduce anxiety.  Arrange for someone to take you home after the procedure.  PROCEDURE   You will remain awake during the procedure. You may receive medicine to make you relaxed.  You will be asked to lie on your stomach.  The injection site will be cleaned.  The injection site will  be numbed with a medicine (local anesthetic).  A needle will be injected through your skin into the epidural space.  Your health care provider will use an X-ray machine to ensure that the steroid is delivered closest to the affected nerve. You may have minimal discomfort at this time.  Once the needle is in the right position, the local anesthetic and the steroid will be injected into the epidural space.  The needle will then be removed and a bandage will be applied to the injection site.  AFTER THE PROCEDURE   You may be monitored for a short time before you go home.  You may feel weakness or numbness in your arm or leg, which disappears within hours.  You may be allowed to eat, drink, and take your regular medicine.  You may have soreness at the site of the injection.   This information is not intended to replace advice given to you by your health care provider. Make sure you discuss any questions you have with your health care provider.   Document Released: 05/20/2007 Document Revised: 10/13/2012 Document Reviewed: 07/30/2012 Elsevier Interactive Patient Education 2016 [place]  What are the risk, side effects and possible complications? Generally speaking, most procedures are safe.  However, with any procedure there are risks, side effects, and the possibility of complications.  The risks and complications are dependent upon the sites that are lesioned, or the type of nerve block to be performed.  The closer the procedure is to the spine, the more serious the risks are.  Great care is taken when  placing the radio frequency needles, block needles or lesioning probes, but sometimes complications can occur. Infection: Any time there is an injection through the skin, there is a risk of infection.  This is why sterile conditions are used for these blocks. There are four possible types of infection: 1. Localized skin infection. 2. Central Nervous  System Infection: This can be in the form of Meningitis, which can be deadly. 3. Epidural Infections: This can be in the form of an epidural abscess, which can cause pressure inside of the spine, causing compression of the spinal cord with subsequent paralysis. This would require an emergency surgery to decompress, and there are no guarantees that the patient would recover from the paralysis. 4. Discitis: This is an infection of the intervertebral discs. It occurs in about 1% of discography procedures. It is difficult to treat and it may lead to surgery. Pain: the needles have to go through skin and soft tissues, will cause soreness. Damage to internal structures:  The nerves to be lesioned may be near blood vessels or other nerves which can be potentially damaged. Bleeding: Bleeding is more common if the patient is taking blood thinners such as  aspirin, Coumadin, Ticiid, Plavix, etc., or if he/she have some genetic predisposition such as hemophilia. Bleeding into the spinal canal can cause compression of the spinal  cord with subsequent paralysis.  This would require an emergency surgery to decompress and there are no guarantees that the patient would recover from the paralysis. Pneumothorax: Puncturing of a lung is a possibility, every time a needle is introduced in the area of the chest or upper back.  Pneumothorax refers to free air around the collapsed lung(s), inside of the thoracic cavity (chest cavity).  Another two possible complications related to a similar event would include: Hemothorax and Chylothorax. These are variations of the Pneumothorax, where instead of air around the collapsed lung(s), you may have blood or chyle, respectively. Spinal headaches: They may occur with any procedures in the area of the spine. Persistent CSF (Cerebro-Spinal Fluid) leakage: This is a rare problem, but may occur with prolonged intrathecal or epidural catheters either due to the formation of a fistulous track  or a dural tear. Nerve damage: By working so close to the spinal cord, there is always a possibility of nerve damage, which could be as serious as a permanent spinal cord injury with paralysis. Death: Although rare, severe deadly allergic reactions known as "Anaphylactic reaction" can occur to any of the medications used. Worsening of the symptoms: We can always make thing worse.  What are the chances of something like this happening? Chances of any of this occuring are extremely low.  By statistics, you have more of a chance of getting killed in a motor vehicle accident: while driving to the hospital than any of the above occurring .  Nevertheless, you should be aware that they are possibilities.  In general, it is similar to taking a shower.  Everybody knows that you can slip, hit your head and get killed.  Does that mean that you should not shower again?  Nevertheless always keep in mind that statistics do not mean anything if you happen to be on the wrong side of them.  Even if a procedure has a 1 (one) in a 1,000,000 (million) chance of going wrong, it you happen to be that one..Also, keep in mind that by statistics, you have more of a chance of having something go wrong when taking medications.  Who should  not have this procedure? If you are on a blood thinning medication (e.g. Coumadin, Plavix, see list of "Blood Thinners"), or if you have an active infection going on, you should not have the procedure.  If you are taking any blood thinners, please inform your physician.  Preparing for your procedure: 1. Do not eat or drink anything at least eight (8) hours prior to the procedure. 2. Bring a driver with you .  It cannot be a taxi. 3. Come accompanied by an adult that can drive you back, and that is strong enough to help you if your legs get weak or numb from the local anesthetic. 4. Take all of your medicines the morning of the procedure with just enough water to swallow them. 5. If you have  diabetes, make sure that you are scheduled to have your procedure done first thing in the morning, whenever possible. 6. If you have diabetes, take only half of your insulin dose and notify our nurse that you have done so as soon as you arrive at the clinic. 7. If you are diabetic, but only take blood sugar pills (oral hypoglycemic), then do not take them on the morning of your procedure.  You may take them after you have had the procedure. 8. Do not take aspirin or any aspirin-containing medications, at least eleven (11) days prior to the procedure.  They may prolong bleeding. 9. Wear loose fitting clothing that may be easy to take off and that you would not mind if it got stained with Betadine or blood. 10. Do not wear any jewelry or perfume 11. Remove any nail coloring.  It will interfere with some of our monitoring equipment. 12. If you take Metformin for your diabetes, stop it 48 hours prior to the procedure.  NOTE: Remember that this is not meant to be interpreted as a complete list of all possible complications.  Unforeseen problems may occur.  BLOOD THINNERS The following drugs contain aspirin or other products, which can cause increased bleeding during surgery and should not be taken for 2 weeks prior to and 1 week after surgery.  If you should need take something for relief of minor pain, you may take acetaminophen which is found in Tylenol,m Datril, Anacin-3 and Panadol. It is not blood thinner. The products listed below are.  Do not take any of the products listed below in addition to any listed on your instruction sheet.  A.P.C or A.P.C with Codeine Codeine Phosphate Capsules #3 Ibuprofen Ridaura  ABC compound Congesprin Imuran rimadil  Advil Cope Indocin Robaxisal  Alka-Seltzer Effervescent Pain Reliever and Antacid Coricidin or Coricidin-D  Indomethacin Rufen  Alka-Seltzer plus Cold Medicine Cosprin Ketoprofen S-A-C Tablets  Anacin Analgesic Tablets or Capsules Coumadin Korlgesic  Salflex  Anacin Extra Strength Analgesic tablets or capsules CP-2 Tablets Lanoril Salicylate  Anaprox Cuprimine Capsules Levenox Salocol  Anexsia-D Dalteparin Magan Salsalate  Anodynos Darvon compound Magnesium Salicylate Sine-off  Ansaid Dasin Capsules Magsal Sodium Salicylate  Anturane Depen Capsules Marnal Soma  APF Arthritis pain formula Dewitt's Pills Measurin Stanback  Argesic Dia-Gesic Meclofenamic Sulfinpyrazone  Arthritis Bayer Timed Release Aspirin Diclofenac Meclomen Sulindac  Arthritis pain formula Anacin Dicumarol Medipren Supac  Analgesic (Safety coated) Arthralgen Diffunasal Mefanamic Suprofen  Arthritis Strength Bufferin Dihydrocodeine Mepro Compound Suprol  Arthropan liquid Dopirydamole Methcarbomol with Aspirin Synalgos  ASA tablets/Enseals Disalcid Micrainin Tagament  Ascriptin Doan's Midol Talwin  Ascriptin A/D Dolene Mobidin Tanderil  Ascriptin Extra Strength Dolobid Moblgesic Ticlid  Ascriptin with Codeine Doloprin or Doloprin  with Codeine Momentum Tolectin  Asperbuf Duoprin Mono-gesic Trendar  Aspergum Duradyne Motrin or Motrin IB Triminicin  Aspirin plain, buffered or enteric coated Durasal Myochrisine Trigesic  Aspirin Suppositories Easprin Nalfon Trillsate  Aspirin with Codeine Ecotrin Regular or Extra Strength Naprosyn Uracel  Atromid-S Efficin Naproxen Ursinus  Auranofin Capsules Elmiron Neocylate Vanquish  Axotal Emagrin Norgesic Verin  Azathioprine Empirin or Empirin with Codeine Normiflo Vitamin E  Azolid Emprazil Nuprin Voltaren  Bayer Aspirin plain, buffered or children's or timed BC Tablets or powders Encaprin Orgaran Warfarin Sodium  Buff-a-Comp Enoxaparin Orudis Zorpin  Buff-a-Comp with Codeine Equegesic Os-Cal-Gesic   Buffaprin Excedrin plain, buffered or Extra Strength Oxalid   Bufferin Arthritis Strength Feldene Oxphenbutazone   Bufferin plain or Extra Strength Feldene Capsules Oxycodone with Aspirin   Bufferin with Codeine Fenoprofen  Fenoprofen Pabalate or Pabalate-SF   Buffets II Flogesic Panagesic   Buffinol plain or Extra Strength Florinal or Florinal with Codeine Panwarfarin   Buf-Tabs Flurbiprofen Penicillamine   Butalbital Compound Four-way cold tablets Penicillin   Butazolidin Fragmin Pepto-Bismol   Carbenicillin Geminisyn Percodan   Carna Arthritis Reliever Geopen Persantine   Carprofen Gold's salt Persistin   Chloramphenicol Goody's Phenylbutazone   Chloromycetin Haltrain Piroxlcam   Clmetidine heparin Plaquenil   Cllnoril Hyco-pap Ponstel   Clofibrate Hydroxy chloroquine Propoxyphen         Before stopping any of these medications, be sure to consult the physician who ordered them.  Some, such as Coumadin (Warfarin) are ordered to prevent or treat serious conditions such as "deep thrombosis", "pumonary embolisms", and other heart problems.  The amount of time that you may need off of the medication may also vary with the medication and the reason for which you were taking it.  If you are taking any of these medications, please make sure you notify your pain physician before you undergo any procedures.Preparing for Procedure with Sedation Instructions: . Oral Intake: Do not eat or drink anything for at least 8 hours prior to your procedure. . Transportation: Public transportation is not allowed. Bring an adult driver. The driver must be physically present in our waiting room before any procedure can be started. Marland Kitchen Physical Assistance: Bring an adult capable of physically assisting you, in the event you need help. . Blood Pressure Medicine: Take your blood pressure medicine with a sip of water the morning of the procedure. . Insulin: Take only  of your normal insulin dose. . Preventing infections: Shower with an antibacterial soap the morning of your procedure. . Build-up your immune system: Take 1000 mg of Vitamin C with every meal (3 times a day) the day prior to your procedure. . Pregnancy: If you are  pregnant, call and cancel the procedure. . Sickness: If you have a cold, fever, or any active infections, call and cancel the procedure. . Arrival: You must be in the facility at least 30 minutes prior to your scheduled procedure. . Children: Do not bring children with you. . Dress appropriately: Bring dark clothing that you would not mind if they get stained. . Valuables: Do not bring any jewelry or valuables. Procedure appointments are reserved for interventional treatments only. Marland Kitchen No Prescription Refills. . No medication changes will be discussed during procedure appointments. No disability issues will be discussed.

## 2017-09-29 NOTE — Progress Notes (Signed)
Patient's Name: Michele Moore  MRN: [id number]  Referring Provider: Juline Patch, MD  DOB: Apr 29, 1950  PCP: Juline Patch, MD  DOS: 09/29/2017  Note by: Gillis Santa, MD  Service setting: Ambulatory outpatient  Specialty: Interventional Pain Management  Location: [facility] (AMB) Pain Management Facility    Patient type: Established   Primary Reason(s) for Visit: Encounter for prescription drug management & post-procedure evaluation of chronic illness with mild to moderate exacerbation(Level of risk: moderate) CC: Hip Pain (left) and Shoulder Pain (right)  HPI  Michele Moore is a 67 y.o. year old, female patient, who comes today for a post-procedure evaluation and medication management. She has Pain, lower extremity; Lumbar radiculopathy; Lumbar degenerative disc disease; Chronic pain syndrome; Spinal stenosis, lumbar region, with neurogenic claudication; Cervical facet syndrome; Osteoarthritis of right shoulder due to rotator cuff injury; Sprain of anterior talofibular ligament of right ankle; Severe obesity (BMI 35.0-35.9 with comorbidity) ([place]); and Bradycardia on their problem list. Her primarily concern today is the Hip Pain (left) and Shoulder Pain (right)  Pain Assessment: Location: Left Hip Radiating: radiates down left leg to all of toes and foot Onset: More than a month ago Duration: Chronic pain Quality: Sharp, Numbness Severity: 7 /10 (subjective, self-reported pain score)  Note: Reported level is inconsistent with clinical observations. Clinically the patient looks like a 3/10 A 3/10 is viewed as "Moderate" and described as significantly interfering with activities of daily living (ADL). It becomes difficult to feed, bathe, get dressed, get on and off the toilet or to perform personal hygiene functions. Difficult to get in and out of bed or a chair without assistance. Very distracting. With effort, it can be ignored when deeply involved in activities. Information on the proper use of the pain  scale provided to the patient today. When using our objective Pain Scale, levels between 6 and 10/10 are said to belong in an emergency room, as it progressively worsens from a 6/10, described as severely limiting, requiring emergency care not usually available at an outpatient pain management facility. At a 6/10 level, communication becomes difficult and requires great effort. Assistance to reach the emergency department may be required. Facial flushing and profuse sweating along with potentially dangerous increases in heart rate and blood pressure will be evident. Effect on ADL: "It interferes with walking and standing" Timing: Constant Modifying factors: medications BP: (!) 146/80  HR: 74  [name] was last seen on 09/01/2017 for a procedure. During today's appointment we reviewed Michele Moore's post-procedure results, as well as her outpatient medication regimen.  Patient follows up today for medication refill.  Of note her last prescriptions were misdated and patient has brought back her old prescriptions to be corrected.  She did have an episode of vertigo yesterday and went to the emergency department.  She states that she has been dealing with vertigo more frequently in the last couple of weeks.  She has a colonoscopy scheduled given heme positive stools and is being evaluated by GI.  Patient did obtain benefit from her previous epidural steroid injection.  She states that the benefit lasted for about 3 to 4 weeks she states that she was able to ambulate without having as much pain.  Patient is interested in repeating epidural steroid injection #2.  Patient is also endorsing  Right neck pain.  Can consider doing repeat cervical facet medial branch nerve blocks after lumbar ESI.  Patient does have knee replacement surgery scheduled for at the end of September.  Further details  on both, my assessment(s), as well as the proposed treatment plan, please see below.  Controlled Substance  Pharmacotherapy Assessment REMS (Risk Evaluation and Mitigation Strategy)  Analgesic: Hydrocodone 7.5 mg TID prn MME/day: 22.5 mg/day.  Dewayne Shorter, RN  09/29/2017 10:29 AM  Signed Nursing Pain Medication Assessment:  Safety precautions to be maintained throughout the outpatient stay will include: orient to surroundings, keep bed in low position, maintain call bell within reach at all times, provide assistance with transfer out of bed and ambulation.  Medication Inspection Compliance: Pill count conducted under aseptic conditions, in front of the patient. Neither the pills nor the bottle was removed from the patient's sight at any time. Once count was completed pills were immediately returned to the patient in their original bottle.  Medication: Hydrocodone/APAP Pill/Patch Count: 9 of 90 pills remain Pill/Patch Appearance: Markings consistent with prescribed medication Bottle Appearance: Standard pharmacy container. Clearly labeled. Filled Date: 07/ 01/ 2019 Last Medication intake:  Today   Pharmacokinetics: Liberation and absorption (onset of action): WNL Distribution (time to peak effect): WNL Metabolism and excretion (duration of action): WNL         Pharmacodynamics: Desired effects: Analgesia: Ms. Ord reports >50% benefit. Functional ability: Patient reports that medication allows her to accomplish basic ADLs Clinically meaningful improvement in function (CMIF): Sustained CMIF goals met Perceived effectiveness: Described as relatively effective, allowing for increase in activities of daily living (ADL) Undesirable effects: Side-effects or Adverse reactions: None reported Monitoring: [place] PMP: Online review of the past 53-monthperiod conducted. Compliant with practice rules and regulations Last UDS on record: Summary  Date Value Ref Range Status  10/14/2016 FINAL  Final    Comment:    ==================================================================== [facility]RE COMP DRUG  ANALYSIS,UR ==================================================================== Test                             Result       Flag       Units Drug Present and Declared for Prescription Verification   Gabapentin                     PRESENT      EXPECTED   Acetaminophen                  PRESENT      EXPECTED Drug Present not Declared for Prescription Verification   Dextrorphan/Levorphanol        PRESENT      UNEXPECTED    Dextrorphan is an expected metabolite of dextromethorphan, an    over-the-counter or prescription cough suppressant. Levorphanol    is a scheduled prescription medication. Dextrorphan cannot be    distinguished from levorphanol by the method used for analysis. Drug Absent but Declared for Prescription Verification   Hydrocodone                    Not Detected UNEXPECTED ng/mg creat   Oxycodone                      Not Detected UNEXPECTED ng/mg creat   Ephedrine/Pseudoephedrine      Not Detected UNEXPECTED   Tramadol                       Not Detected UNEXPECTED   Pregabalin                     Not  Detected UNEXPECTED   Tizanidine                     Not Detected UNEXPECTED    Tizanidine, as indicated in the declared medication list, is not    always detected even when used as directed.   Duloxetine                     Not Detected UNEXPECTED   Salicylate                     Not Detected UNEXPECTED    Aspirin, as indicated in the declared medication list, is not    always detected even when used as directed.   Chlorpheniramine               Not Detected UNEXPECTED   Hydroxyzine                    Not Detected UNEXPECTED   Lidocaine                      Not Detected UNEXPECTED    Lidocaine, as indicated in the declared medication list, is not    always detected even when used as directed. ==================================================================== Test                      Result    Flag   Units      Ref Range   Creatinine              101              mg/dL       >=20 ==================================================================== Declared Medications:  The flagging and interpretation on this report are based on the  following declared medications.  Unexpected results may arise from  inaccuracies in the declared medications.  **Note: The testing scope of this panel includes these medications:  Chlorpheniramine (Tussionex)  Duloxetine  Gabapentin  Hydrocodone (Hydrocodone-Acetaminophen)  Hydrocodone (Tussionex)  Hydroxyzine  Oxycodone (Oxycodone Acetaminophen)  Pregabalin  Pseudoephedrine (Fexofenadine Pseudoephedrine)  Tramadol  **Note: The testing scope of this panel does not include small to  moderate amounts of these reported medications:  Acetaminophen  Acetaminophen (Hydrocodone-Acetaminophen)  Acetaminophen (Oxycodone Acetaminophen)  Aspirin  Lidocaine  Tizanidine  **Note: The testing scope of this panel does not include following  reported medications:  Cefuroxime axetil  Fexofenadine (Fexofenadine Pseudoephedrine)  Hydralazine  Hydrochlorothiazide (Triamterine-Hydrochlorthzide)  Ketoconazole  Levothyroxine  Meloxicam  Mupirocin  Nystatin  Omeprazole  Polyethylene Glycol  Polymyxin  Potassium  Prednisone  Ranitidine  Supplement (Omega-3)  Topical  Triamcinolone acetonide  Triamterene (Triamterine-Hydrochlorthzide)  Trimethoprim ==================================================================== For clinical consultation, please call [phone]. ====================================================================    UDS interpretation: Compliant          Medication Assessment Form: Reviewed. Patient indicates being compliant with therapy Treatment compliance: Compliant Risk Assessment Profile: Aberrant behavior: See prior evaluations. None observed or detected today Comorbid factors increasing risk of overdose: See prior notes. No additional risks detected today Risk of substance use disorder (SUD):  Low Opioid Risk Tool - 08/31/17 0956      Family History of Substance Abuse   Alcohol  Negative    Illegal Drugs  Negative    Rx Drugs  Negative      Personal History of Substance Abuse   Alcohol  Negative    Illegal Drugs  Negative    Rx Drugs  Negative  Age   Age between 41-45 years   No      History of Preadolescent Sexual Abuse   History of Preadolescent Sexual Abuse  Negative or Female      Psychological Disease   Psychological Disease  Negative    Depression  Negative      Total Score   Opioid Risk Tool Scoring  0    Opioid Risk Interpretation  Low Risk      ORT Scoring interpretation table:  Score <3 = Low Risk for SUD  Score between 4-7 = Moderate Risk for SUD  Score >8 = High Risk for Opioid Abuse   Risk Mitigation Strategies:  Patient Counseling: Covered Patient-Prescriber Agreement (PPA): Present and active  Notification to other healthcare providers: Done  Pharmacologic Plan: No change in therapy, at this time.             Post-Procedure Assessment  08/31/2017 Procedure: L4-5 ESI Pre-procedure pain score:  7/10 Post-procedure pain score: 7/10          Long-term benefit: Defined as the period of time past the expected duration of local anesthetics (1 hour for short-acting and 4-6 hours for long-acting). With the possible exception of prolonged sympathetic blockade from the local anesthetics, benefits during this period are typically attributed to, or associated with, other factors such as analgesic sensory neuropraxia, antiinflammatory effects, or beneficial biochemical changes provided by agents other than the local anesthetics.  Reported result: Extended relief following procedure:30%    (Long-Term Relief)            Interpretative annotation: Clinically possible results. Good relief. No permanent benefit expected. Inflammation plays a part in the etiology to the pain.          Current benefits: Defined as reported results that persistent at this point in  time.   Analgesia: 25 %            Function: Somewhat improved ROM: Somewhat improved Interpretative annotation: Recurrence of symptoms. No permanent benefit expected. Effective diagnostic intervention.          Interpretation: Results would suggest a successful diagnostic intervention.                  Plan:  Repeat treatment or therapy and compare extent and duration of benefits.                Laboratory Chemistry  Inflammation Markers (CRP: Acute Phase) (ESR: Chronic Phase) No results found for: CRP, ESRSEDRATE, LATICACIDVEN                       Rheumatology Markers No results found for: RF, ANA, LABURIC, URICUR, LYMEIGGIGMAB, LYMEABIGMQN, HLAB27                      Renal Function Markers Lab Results  Component Value Date   BUN 25 (H) 09/07/2017   CREATININE 1.72 (H) 09/07/2017   BCR 15 12/23/2016   GFRAA 34 (L) 09/07/2017   GFRNONAA 29 (L) 09/07/2017                             Hepatic Function Markers Lab Results  Component Value Date   AST 21 12/27/2013   ALT 19 12/27/2013   ALBUMIN 4.5 12/23/2016   ALKPHOS 91 12/27/2013  Electrolytes Lab Results  Component Value Date   NA 142 09/07/2017   K 3.4 (L) 09/07/2017   CL 109 09/07/2017   CALCIUM 8.9 09/07/2017   MG 2.2 12/27/2013   PHOS 3.0 12/23/2016                        Neuropathy Markers Lab Results  Component Value Date   HIV Non Reactive 08/25/2017                        Bone Pathology Markers No results found for: [place], [id number], [id number], [id number], 25OHVITD1, 25OHVITD2, 25OHVITD3, TESTOFREE, TESTOSTERONE                       Coagulation Parameters Lab Results  Component Value Date   PLT 145 (L) 09/07/2017                        Cardiovascular Markers Lab Results  Component Value Date   CKTOTAL 154 12/27/2013   CKMB 0.8 12/27/2013   TROPONINI <0.03 09/07/2017   HGB 10.6 (L) 09/07/2017   HCT 31.5 (L) 09/07/2017                         CA Markers No  results found for: CEA, CA125, LABCA2                      Note: Lab results reviewed.  Recent Diagnostic Imaging Results  CT Angio Chest PE W and/or Wo Contrast CLINICAL DATA:  Chest pain for several days  EXAM: CT ANGIOGRAPHY CHEST WITH CONTRAST  TECHNIQUE: Multidetector CT imaging of the chest was performed using the standard protocol during bolus administration of intravenous contrast. Multiplanar CT image reconstructions and MIPs were obtained to evaluate the vascular anatomy.  CONTRAST:  38m ISOVUE-370 IOPAMIDOL (ISOVUE-370) INJECTION 76%  COMPARISON:  CT angiogram chest December 27, 2013; chest radiograph September 07, 2017  FINDINGS: Cardiovascular: There is no evident pulmonary embolus. There is no thoracic aortic aneurysm. No dissection evident. The contrast bolus is somewhat less than optimal for assessment for dissection by radiography. Visualized great vessels appear normal. There is apparent common origin of the right innominate and left common carotid arteries. There are foci of aortic atherosclerosis. There is no appreciable pericardial effusion or pericardial thickening.  The main pulmonary outflow tract measures 3.0 cm in diameter, mildly prominent.  Mediastinum/Nodes: Thyroid appears unremarkable. There is no thoracic adenopathy. There is a small hiatal hernia.  Lungs/Pleura: There are areas of patchy atelectatic change in both lower lobes and in the inferior lingular region. There is no edema or consolidation appreciable. No pleural effusion or pleural thickening evident.  Upper Abdomen: Visualized upper abdominal structures appear unremarkable.  Musculoskeletal: There is midthoracic dextroscoliosis. There are no blastic or lytic bone lesions. There is no evident chest wall lesion.  Review of the MIP images confirms the above findings.  IMPRESSION: 1. No demonstrable pulmonary embolus. No thoracic aortic aneurysm. No dissection seen. Contrast bolus  is less than optimal for dissection assessment. There is aortic atherosclerosis.  2. Prominence of the main pulmonary outflow tract. Suspect a degree of pulmonary arterial hypertension.  3. Areas of atelectatic change in the lower lobes and inferior lingula. No frank edema or consolidation.  4.  No appreciable thoracic adenopathy.  5.  Small hiatal hernia.  Aortic Atherosclerosis (ICD10-I70.0).  Electronically Signed   By: Lowella Grip III M.D.   On: 09/07/2017 08:17 CT Head Wo Contrast CLINICAL DATA:  Orthostatic dizziness  EXAM: CT HEAD WITHOUT CONTRAST  TECHNIQUE: Contiguous axial images were obtained from the base of the skull through the vertex without intravenous contrast.  COMPARISON:  Head CT 05/13/2017  FINDINGS: Brain: There is no mass, hemorrhage or extra-axial collection. The size and configuration of the ventricles and extra-axial CSF spaces are normal. There is no acute or chronic infarction. The brain parenchyma is normal.  Vascular: No abnormal hyperdensity of the major intracranial arteries or dural venous sinuses. No intracranial atherosclerosis.  Skull: The visualized skull base, calvarium and extracranial soft tissues are normal.  Sinuses/Orbits: No fluid levels or advanced mucosal thickening of the visualized paranasal sinuses. No mastoid or middle ear effusion. The orbits are normal.  IMPRESSION: Normal brain.  Electronically Signed   By: Ulyses Jarred M.D.   On: 09/07/2017 06:11 DG Chest 2 View CLINICAL DATA:  67 year old female with chest pain and dizziness.  EXAM: CHEST - 2 VIEW  COMPARISON:  Chest radiograph dated 06/09/2017  FINDINGS: The heart size and mediastinal contours are within normal limits. Both lungs are clear. The visualized skeletal structures are unremarkable.  IMPRESSION: No active cardiopulmonary disease.  Electronically Signed   By: Anner Crete M.D.   On: 09/07/2017 02:47  Complexity Note:  Imaging results reviewed. Results shared with Ms. Clay, using Layman's terms.                         Meds   Current Outpatient Medications:  .  albuterol (PROVENTIL HFA;VENTOLIN HFA) 108 (90 Base) MCG/ACT inhaler, Inhale 2 puffs into the lungs every 4 (four) hours as needed for wheezing., Disp: , Rfl:  .  allopurinol (ZYLOPRIM) 100 MG tablet, Take 2 tablets by mouth daily. Dr Barb Merino, Disp: , Rfl:  .  aspirin EC 81 MG tablet, Take 81 mg by mouth daily., Disp: , Rfl:  .  Cholecalciferol (VITAMIN D3) 2000 units TABS, Take 2,000 Units by mouth daily., Disp: , Rfl:  .  diclofenac sodium (VOLTAREN) 1 % GEL, Apply 2 g topically 3 (three) times daily. [facility], Disp: , Rfl:  .  ferrous sulfate 325 (65 FE) MG tablet, Take 325 mg by mouth daily with breakfast. otc, Disp: , Rfl:  .  fexofenadine-pseudoephedrine (ALLEGRA-D 24) 180-240 MG 24 hr tablet, Take 1 tablet by mouth daily as needed (allergies/sinus pressure). , Disp: , Rfl:  .  fluticasone (FLONASE) 50 MCG/ACT nasal spray, Place 1 spray into both nostrils daily., Disp: 16 g, Rfl: 2 .  gabapentin (NEURONTIN) 300 MG capsule, TAKE 2 CAPSULES BY MOUTH NIGHTLY/ Ervey Fallin (Patient taking differently: Take 600 mg by mouth at bedtime. ), Disp: 60 capsule, Rfl: 3 .  hydrALAZINE (APRESOLINE) 50 MG tablet, TAKE 1 TABLET BY MOUTH TWICE DAILY, Disp: 60 tablet, Rfl: 1 .  HYDROcodone-acetaminophen (NORCO) 7.5-325 MG tablet, Take 1 tablet by mouth 3 (three) times daily as needed for severe pain. For Chronic Pain DNF: 09/30/17, 10/30/17, 11/28/17, Disp: 90 tablet, Rfl: 0 .  ketoconazole (NIZORAL) 2 % cream, U UTD QHS (Patient taking differently: Apply 1 application topically at bedtime. U UTD QHS), Disp: 30 g, Rfl: 0 .  levothyroxine (SYNTHROID) 200 MCG tablet, Take 1 tablet (200 mcg total) by mouth daily before breakfast., Disp: 30 tablet, Rfl: 5 .  meclizine (ANTIVERT) 12.5 MG tablet, Take 1 tablet (12.5 mg total) by  mouth 2 (two) times daily as needed for  dizziness., Disp: 30 tablet, Rfl: 0 .  Multiple Vitamins-Calcium (ONE-A-DAY WOMENS FORMULA PO), Take 1 tablet by mouth daily., Disp: , Rfl:  .  mupirocin cream (BACTROBAN) 2 %, Apply 1 application topically 2 (two) times daily. (Patient taking differently: Apply 1 application topically 2 (two) times daily as needed (irritation). ), Disp: 15 g, Rfl: 0 .  nystatin cream (MYCOSTATIN), Apply 1 application topically 2 (two) times daily., Disp: 30 g, Rfl: 0 .  omega-3 acid ethyl esters (LOVAZA) 1 g capsule, TAKE 2 CAPSULES BY MOUTH TWICE DAILY, Disp: 120 capsule, Rfl: 0 .  omeprazole (PRILOSEC) 40 MG capsule, Take 1 capsule (40 mg total) by mouth daily., Disp: 30 capsule, Rfl: 5 .  Polyethyl Glycol-Propyl Glycol (SYSTANE OP), Place 1 drop into both eyes daily as needed (dry eyes). , Disp: , Rfl:  .  polyethylene glycol (MIRALAX / GLYCOLAX) packet, Take 17 g by mouth daily. (Patient taking differently: Take 17 g by mouth daily as needed for mild constipation. ), Disp: 14 each, Rfl: 0 .  potassium chloride SA (K-DUR,KLOR-CON) 20 MEQ tablet, TAKE 1 TABLET(20 MEQ) BY MOUTH DAILY, Disp: 30 tablet, Rfl: 4 .  ranitidine (ZANTAC) 150 MG tablet, Take 1 tablet (150 mg total) by mouth 2 (two) times daily., Disp: 60 tablet, Rfl: 5 .  tiZANidine (ZANAFLEX) 4 MG capsule, Take 1 capsule (4 mg total) by mouth 2 (two) times daily as needed. (Patient taking differently: Take 4 mg by mouth 2 (two) times daily as needed for muscle spasms. ), Disp: 60 capsule, Rfl: 2 .  triamterene-hydrochlorothiazide (MAXZIDE) 75-50 MG tablet, Take 1 tablet by mouth daily., Disp: 30 tablet, Rfl: 5  ROS  Constitutional: Denies any fever or chills Gastrointestinal: No reported hemesis, hematochezia, vomiting, or acute GI distress Musculoskeletal: Denies any acute onset joint swelling, redness, loss of ROM, or weakness Neurological: No reported episodes of acute onset apraxia, aphasia, dysarthria, agnosia, amnesia, paralysis, loss of  coordination, or loss of consciousness  Allergies  Ms. Lievanos is allergic to penicillins and vibramycin [doxycycline calcium].  PFSH  Drug: Ms. Paternoster  reports that she does not use drugs. Alcohol:  reports that she does not drink alcohol. Tobacco:  reports that she quit smoking about 24 years ago. She has never used smokeless tobacco. Medical:  has a past medical history of Anemia, Arthritis, Chronic kidney disease, Collagen vascular disease ([place]), Family history of adverse reaction to anesthesia, GERD (gastroesophageal reflux disease), H/O tooth extraction, Hemorrhoid, History of hiatal hernia, Hypertension, Migraines, Mixed hyperlipidemia, Multiple gastric ulcers, and Thyroid disease. Surgical: Ms. Creedon  has a past surgical history that includes Carpal tunnel release; Rotator cuff repair; Ankle surgery; Tubal ligation; Colonoscopy (2000?); Upper gi endoscopy (2000?); Joint replacement; Tonsillectomy; and Fusion of talonavicular joint (Right, 08/03/2015). Family: family history includes COPD in her sister; Cancer in her maternal aunt and maternal uncle.  Constitutional Exam  General appearance: Well nourished, well developed, and well hydrated. In no apparent acute distress Vitals:   09/29/17 1022  BP: (!) 146/80  Pulse: 74  Resp: 18  Temp: 98.3 F (36.8 C)  SpO2: 95%  Weight: 228 lb (103.4 kg)  Height: '5\' 11"'$  (1.803 m)   BMI Assessment: Estimated body mass index is 31.8 kg/m as calculated from the following:   Height as of this encounter: '5\' 11"'$  (1.803 m).   Weight as of this encounter: 228 lb (103.4 kg).  BMI interpretation table: BMI level Category Range association with  higher incidence of chronic pain  <18 kg/m2 Underweight   18.5-24.9 kg/m2 Ideal body weight   25-29.9 kg/m2 Overweight Increased incidence by 20%  30-34.9 kg/m2 Obese (Class I) Increased incidence by 68%  35-39.9 kg/m2 Severe obesity (Class II) Increased incidence by 136%  >40 kg/m2 Extreme obesity (Class III)  Increased incidence by 254%   Patient's current BMI Ideal Body weight  Body mass index is 31.8 kg/m. Ideal body weight: 70.8 kg (156 lb 1.4 oz) Adjusted ideal body weight: 83.8 kg (184 lb 13.6 oz)   BMI Readings from Last 4 Encounters:  09/29/17 31.80 kg/m  09/23/17 34.59 kg/m  09/14/17 34.17 kg/m  09/07/17 34.17 kg/m   Wt Readings from Last 4 Encounters:  09/29/17 228 lb (103.4 kg)  09/23/17 248 lb (112.5 kg)  09/14/17 245 lb (111.1 kg)  09/07/17 245 lb (111.1 kg)  Psych/Mental status: Alert, oriented x 3 (person, place, & time)       Eyes: PERLA Respiratory: No evidence of acute respiratory distress  Cervical Spine Area Exam  Skin & Axial Inspection: No masses, redness, edema, swelling, or associated skin lesions Alignment: Symmetrical Functional ROM: Decreased ROM, to the right Stability: No instability detected Muscle Tone/Strength: Functionally intact. No obvious neuro-muscular anomalies detected. Sensory (Neurological): Musculoskeletal pain pattern Palpation: Complains of area being tender to palpation Positive provocative maneuver for for cervical facet disease (R>L)  Upper Extremity (UE) Exam    Side: Right upper extremity  Side: Left upper extremity  Skin & Extremity Inspection: Skin color, temperature, and hair growth are WNL. No peripheral edema or cyanosis. No masses, redness, swelling, asymmetry, or associated skin lesions. No contractures.  Skin & Extremity Inspection: Skin color, temperature, and hair growth are WNL. No peripheral edema or cyanosis. No masses, redness, swelling, asymmetry, or associated skin lesions. No contractures.  Functional ROM: Unrestricted ROM          Functional ROM: Unrestricted ROM          Muscle Tone/Strength: Functionally intact. No obvious neuro-muscular anomalies detected.  Muscle Tone/Strength: Functionally intact. No obvious neuro-muscular anomalies detected.  Sensory (Neurological): Unimpaired          Sensory (Neurological):  Unimpaired          Palpation: No palpable anomalies              Palpation: No palpable anomalies              Provocative Test(s):  Phalen's test: deferred Tinel's test: deferred Apley's scratch test (touch opposite shoulder):  Action 1 (Across chest): deferred Action 2 (Overhead): deferred Action 3 (LB reach): deferred   Provocative Test(s):  Phalen's test: deferred Tinel's test: deferred Apley's scratch test (touch opposite shoulder):  Action 1 (Across chest): deferred Action 2 (Overhead): deferred Action 3 (LB reach): deferred    Thoracic Spine Area Exam  Skin & Axial Inspection: No masses, redness, or swelling Alignment: Symmetrical Functional ROM: Unrestricted ROM Stability: No instability detected Muscle Tone/Strength: Functionally intact. No obvious neuro-muscular anomalies detected. Sensory (Neurological): Unimpaired Muscle strength & Tone: No palpable anomalies  Lumbar Spine Area Exam  Skin & Axial Inspection: No masses, redness, or swelling Alignment: Symmetrical Functional ROM: Decreased ROM       Stability: No instability detected Muscle Tone/Strength: Functionally intact. No obvious neuro-muscular anomalies detected. Sensory (Neurological): Dermatomal pain pattern Palpation: No palpable anomalies       Provocative Tests: Hyperextension/rotation test: (+) due to pain. Lumbar quadrant test (Kemp's test): deferred today  Lateral bending test: (+) ipsilateral radicular pain, bilaterally. Positive for bilateral foraminal stenosis. Patrick's Maneuver: deferred today                   FABER test: deferred today                   S-I anterior distraction/compression test: deferred today         S-I lateral compression test: deferred today         S-I Thigh-thrust test: deferred today         S-I Gaenslen's test: deferred today           Gait & Posture Assessment  Ambulation: Unassisted Gait: Relatively normal for age and body habitus Posture: WNL    Lower Extremity Exam    Side: Right lower extremity  Side: Left lower extremity  Stability: No instability observed          Stability: No instability observed          Skin & Extremity Inspection: Skin color, temperature, and hair growth are WNL. No peripheral edema or cyanosis. No masses, redness, swelling, asymmetry, or associated skin lesions. No contractures.  Skin & Extremity Inspection: Skin color, temperature, and hair growth are WNL. No peripheral edema or cyanosis. No masses, redness, swelling, asymmetry, or associated skin lesions. No contractures.  Functional ROM: Unrestricted ROM                  Functional ROM: Unrestricted ROM                  Muscle Tone/Strength: Functionally intact. No obvious neuro-muscular anomalies detected.  Muscle Tone/Strength: Functionally intact. No obvious neuro-muscular anomalies detected.  Sensory (Neurological): Unimpaired  Sensory (Neurological): Unimpaired  Palpation: No palpable anomalies  Palpation: No palpable anomalies   Assessment  Primary Diagnosis & Pertinent Problem List: The primary encounter diagnosis was Lumbar radiculopathy. Diagnoses of Lumbar degenerative disc disease, Chronic pain syndrome, Neck pain on left side, and Spinal stenosis, lumbar region, with neurogenic claudication were also pertinent to this visit.  Status Diagnosis  Responding Persistent Persistent 1. Lumbar radiculopathy   2. Lumbar degenerative disc disease   3. Chronic pain syndrome   4. Neck pain on left side   5. Spinal stenosis, lumbar region, with neurogenic claudication       Patient follows up today for medication refill.  Of note her last prescriptions were misdated and patient has brought back her old prescriptions to be corrected.  She did have an episode of vertigo yesterday and went to the emergency department.  She states that she has been dealing with vertigo more frequently in the last couple of weeks.  She has a colonoscopy scheduled given  heme positive stools and is being evaluated by GI.  Patient did obtain benefit from her previous epidural steroid injection.  She states that the benefit lasted for about 3 to 4 weeks she states that she was able to ambulate without having as much pain.  Patient is interested in repeating epidural steroid injection #2.  Patient is also endorsing  Right neck pain.  Can consider doing repeat cervical facet medial branch nerve blocks after lumbar ESI.  Patient does have knee replacement surgery scheduled for at the end of September.   Plan of Care  Pharmacotherapy (Medications Ordered): Meds ordered this encounter  Medications  . DISCONTD: HYDROcodone-acetaminophen (NORCO) 7.5-325 MG tablet    Sig: Take 1 tablet by mouth 3 (three) times daily  as needed for severe pain. For Chronic Pain DNF: 09/30/17, 10/30/17, 11/28/17    Dispense:  90 tablet    Refill:  0  . DISCONTD: HYDROcodone-acetaminophen (NORCO) 7.5-325 MG tablet    Sig: Take 1 tablet by mouth 3 (three) times daily as needed for severe pain. For Chronic Pain DNF: 09/30/17, 10/30/17, 11/28/17    Dispense:  90 tablet    Refill:  0  . HYDROcodone-acetaminophen (NORCO) 7.5-325 MG tablet    Sig: Take 1 tablet by mouth 3 (three) times daily as needed for severe pain. For Chronic Pain DNF: 09/30/17, 10/30/17, 11/28/17    Dispense:  90 tablet    Refill:  0   Lab-work, procedure(s), and/or referral(s): Orders Placed This Encounter  Procedures  . Lumbar Epidural Injection    Considering:   RIGHT Cervical facets block (C4,5,6,7)    Provider-requested follow-up: Return in about 2 weeks (around 10/13/2017) for Procedure. Time Note: Greater than 50% of the 25 minute(s) of face-to-face time spent with Ms. Giarrusso, was spent in counseling/coordination of care regarding: Ms. Schiavi's primary cause of pain, the treatment plan, treatment alternatives, the risks and possible complications of proposed treatment, the results, interpretation and significance of  her  recent diagnostic interventional treatment(s), realistic expectations and the need to bring and keep the BMI below 30.  Future Appointments  Date Time Provider [place]  10/01/2017  1:45 PM Juline Patch, MD [facility] None  10/12/2017 10:00 AM Gillis Santa, MD [facility] None  10/14/2017  2:15 PM Wayne ADVISOR [facility] None  11/05/2017 11:00 AM [facility] PAT2 [facility] None  12/29/2017  1:30 PM Gillis Santa, MD [facility] None    Primary Care Physician: Juline Patch, MD Location: [facility] Note by: Gillis Santa, M.D Date: 09/29/2017; Time: 11:34 AM  Patient Instructions     You have been given 3 scripts for hydrocodone today   Epidural Steroid Injection An epidural steroid injection is given to relieve pain in your neck, back, or legs that is caused by the irritation or swelling of a nerve root. This procedure involves injecting a steroid and numbing medicine (anesthetic) into the epidural space. The epidural space is the space between the outer covering of your spinal cord and the bones that form your backbone (vertebra).  LET [facility] CARE PROVIDER KNOW ABOUT:   Any allergies you have.  All medicines you are taking, including vitamins, herbs, eye drops, creams, and over-the-counter medicines such as aspirin.  Previous problems you or members of your family have had with the use of anesthetics.  Any blood disorders or blood clotting disorders you have.  Previous surgeries you have had.  Medical conditions you have.  RISKS AND COMPLICATIONS Generally, this is a safe procedure. However, as with any procedure, complications can occur. Possible complications of epidural steroid injection include:  Headache.  Bleeding.  Infection.  Allergic reaction to the medicines.  Damage to your nerves. The response to this procedure depends on the underlying cause of the pain and its duration. People who have long-term (chronic) pain  are less likely to benefit from epidural steroids than are those people whose pain comes on strong and suddenly.  BEFORE THE PROCEDURE   Ask your health care provider about changing or stopping your regular medicines. You may be advised to stop taking blood-thinning medicines a few days before the procedure.  You may be given medicines to reduce anxiety.  Arrange for someone to take you home after the procedure.  PROCEDURE   You will remain awake during the procedure. You may receive medicine to make you relaxed.  You will be asked to lie on your stomach.  The injection site will be cleaned.  The injection site will be numbed with a medicine (local anesthetic).  A needle will be injected through your skin into the epidural space.  Your health care provider will use an X-ray machine to ensure that the steroid is delivered closest to the affected nerve. You may have minimal discomfort at this time.  Once the needle is in the right position, the local anesthetic and the steroid will be injected into the epidural space.  The needle will then be removed and a bandage will be applied to the injection site.  AFTER THE PROCEDURE   You may be monitored for a short time before you go home.  You may feel weakness or numbness in your arm or leg, which disappears within hours.  You may be allowed to eat, drink, and take your regular medicine.  You may have soreness at the site of the injection.   This information is not intended to replace advice given to you by your health care provider. Make sure you discuss any questions you have with your health care provider.   Document Released: 05/20/2007 Document Revised: 10/13/2012 Document Reviewed: 07/30/2012 Elsevier Interactive Patient Education 2016 [place]  What are the risk, side effects and possible complications? Generally speaking, most procedures are safe.  However, with any procedure there  are risks, side effects, and the possibility of complications.  The risks and complications are dependent upon the sites that are lesioned, or the type of nerve block to be performed.  The closer the procedure is to the spine, the more serious the risks are.  Great care is taken when placing the radio frequency needles, block needles or lesioning probes, but sometimes complications can occur. Infection: Any time there is an injection through the skin, there is a risk of infection.  This is why sterile conditions are used for these blocks. There are four possible types of infection: 1. Localized skin infection. 2. Central Nervous System Infection: This can be in the form of Meningitis, which can be deadly. 3. Epidural Infections: This can be in the form of an epidural abscess, which can cause pressure inside of the spine, causing compression of the spinal cord with subsequent paralysis. This would require an emergency surgery to decompress, and there are no guarantees that the patient would recover from the paralysis. 4. Discitis: This is an infection of the intervertebral discs. It occurs in about 1% of discography procedures. It is difficult to treat and it may lead to surgery. Pain: the needles have to go through skin and soft tissues, will cause soreness. Damage to internal structures:  The nerves to be lesioned may be near blood vessels or other nerves which can be potentially damaged. Bleeding: Bleeding is more common if the patient is taking blood thinners such as  aspirin, Coumadin, Ticiid, Plavix, etc., or if he/she have some genetic predisposition such as hemophilia. Bleeding into the spinal canal can cause compression of the spinal  cord with subsequent paralysis.  This would require an emergency surgery to decompress and there are no guarantees that the patient would recover from the paralysis. Pneumothorax: Puncturing of a lung is a possibility, every time a needle is introduced in the area of  the chest or upper back.  Pneumothorax refers to free  air around the collapsed lung(s), inside of the thoracic cavity (chest cavity).  Another two possible complications related to a similar event would include: Hemothorax and Chylothorax. These are variations of the Pneumothorax, where instead of air around the collapsed lung(s), you may have blood or chyle, respectively. Spinal headaches: They may occur with any procedures in the area of the spine. Persistent CSF (Cerebro-Spinal Fluid) leakage: This is a rare problem, but may occur with prolonged intrathecal or epidural catheters either due to the formation of a fistulous track or a dural tear. Nerve damage: By working so close to the spinal cord, there is always a possibility of nerve damage, which could be as serious as a permanent spinal cord injury with paralysis. Death: Although rare, severe deadly allergic reactions known as "Anaphylactic reaction" can occur to any of the medications used. Worsening of the symptoms: We can always make thing worse.  What are the chances of something like this happening? Chances of any of this occuring are extremely low.  By statistics, you have more of a chance of getting killed in a motor vehicle accident: while driving to the hospital than any of the above occurring .  Nevertheless, you should be aware that they are possibilities.  In general, it is similar to taking a shower.  Everybody knows that you can slip, hit your head and get killed.  Does that mean that you should not shower again?  Nevertheless always keep in mind that statistics do not mean anything if you happen to be on the wrong side of them.  Even if a procedure has a 1 (one) in a 1,000,000 (million) chance of going wrong, it you happen to be that one..Also, keep in mind that by statistics, you have more of a chance of having something go wrong when taking medications.  Who should not have this procedure? If you are on a blood thinning medication  (e.g. Coumadin, Plavix, see list of "Blood Thinners"), or if you have an active infection going on, you should not have the procedure.  If you are taking any blood thinners, please inform your physician.  Preparing for your procedure: 1. Do not eat or drink anything at least eight (8) hours prior to the procedure. 2. Bring a driver with you .  It cannot be a taxi. 3. Come accompanied by an adult that can drive you back, and that is strong enough to help you if your legs get weak or numb from the local anesthetic. 4. Take all of your medicines the morning of the procedure with just enough water to swallow them. 5. If you have diabetes, make sure that you are scheduled to have your procedure done first thing in the morning, whenever possible. 6. If you have diabetes, take only half of your insulin dose and notify our nurse that you have done so as soon as you arrive at the clinic. 7. If you are diabetic, but only take blood sugar pills (oral hypoglycemic), then do not take them on the morning of your procedure.  You may take them after you have had the procedure. 8. Do not take aspirin or any aspirin-containing medications, at least eleven (11) days prior to the procedure.  They may prolong bleeding. 9. Wear loose fitting clothing that may be easy to take off and that you would not mind if it got stained with Betadine or blood. 10. Do not wear any jewelry or perfume 11. Remove any nail coloring.  It will interfere with some  of our monitoring equipment. 12. If you take Metformin for your diabetes, stop it 48 hours prior to the procedure.  NOTE: Remember that this is not meant to be interpreted as a complete list of all possible complications.  Unforeseen problems may occur.  BLOOD THINNERS The following drugs contain aspirin or other products, which can cause increased bleeding during surgery and should not be taken for 2 weeks prior to and 1 week after surgery.  If you should need take something for  relief of minor pain, you may take acetaminophen which is found in Tylenol,m Datril, Anacin-3 and Panadol. It is not blood thinner. The products listed below are.  Do not take any of the products listed below in addition to any listed on your instruction sheet.  A.P.C or A.P.C with Codeine Codeine Phosphate Capsules #3 Ibuprofen Ridaura  ABC compound Congesprin Imuran rimadil  Advil Cope Indocin Robaxisal  Alka-Seltzer Effervescent Pain Reliever and Antacid Coricidin or Coricidin-D  Indomethacin Rufen  Alka-Seltzer plus Cold Medicine Cosprin Ketoprofen S-A-C Tablets  Anacin Analgesic Tablets or Capsules Coumadin Korlgesic Salflex  Anacin Extra Strength Analgesic tablets or capsules CP-2 Tablets Lanoril Salicylate  Anaprox Cuprimine Capsules Levenox Salocol  Anexsia-D Dalteparin Magan Salsalate  Anodynos Darvon compound Magnesium Salicylate Sine-off  Ansaid Dasin Capsules Magsal Sodium Salicylate  Anturane Depen Capsules Marnal Soma  APF Arthritis pain formula Dewitt's Pills Measurin Stanback  Argesic Dia-Gesic Meclofenamic Sulfinpyrazone  Arthritis Bayer Timed Release Aspirin Diclofenac Meclomen Sulindac  Arthritis pain formula Anacin Dicumarol Medipren Supac  Analgesic (Safety coated) Arthralgen Diffunasal Mefanamic Suprofen  Arthritis Strength Bufferin Dihydrocodeine Mepro Compound Suprol  Arthropan liquid Dopirydamole Methcarbomol with Aspirin Synalgos  ASA tablets/Enseals Disalcid Micrainin Tagament  Ascriptin Doan's Midol Talwin  Ascriptin A/D Dolene Mobidin Tanderil  Ascriptin Extra Strength Dolobid Moblgesic Ticlid  Ascriptin with Codeine Doloprin or Doloprin with Codeine Momentum Tolectin  Asperbuf Duoprin Mono-gesic Trendar  Aspergum Duradyne Motrin or Motrin IB Triminicin  Aspirin plain, buffered or enteric coated Durasal Myochrisine Trigesic  Aspirin Suppositories Easprin Nalfon Trillsate  Aspirin with Codeine Ecotrin Regular or Extra Strength Naprosyn Uracel  Atromid-S  Efficin Naproxen Ursinus  Auranofin Capsules Elmiron Neocylate Vanquish  Axotal Emagrin Norgesic Verin  Azathioprine Empirin or Empirin with Codeine Normiflo Vitamin E  Azolid Emprazil Nuprin Voltaren  Bayer Aspirin plain, buffered or children's or timed BC Tablets or powders Encaprin Orgaran Warfarin Sodium  Buff-a-Comp Enoxaparin Orudis Zorpin  Buff-a-Comp with Codeine Equegesic Os-Cal-Gesic   Buffaprin Excedrin plain, buffered or Extra Strength Oxalid   Bufferin Arthritis Strength Feldene Oxphenbutazone   Bufferin plain or Extra Strength Feldene Capsules Oxycodone with Aspirin   Bufferin with Codeine Fenoprofen Fenoprofen Pabalate or Pabalate-SF   Buffets II Flogesic Panagesic   Buffinol plain or Extra Strength Florinal or Florinal with Codeine Panwarfarin   Buf-Tabs Flurbiprofen Penicillamine   Butalbital Compound Four-way cold tablets Penicillin   Butazolidin Fragmin Pepto-Bismol   Carbenicillin Geminisyn Percodan   Carna Arthritis Reliever Geopen Persantine   Carprofen Gold's salt Persistin   Chloramphenicol Goody's Phenylbutazone   Chloromycetin Haltrain Piroxlcam   Clmetidine heparin Plaquenil   Cllnoril Hyco-pap Ponstel   Clofibrate Hydroxy chloroquine Propoxyphen         Before stopping any of these medications, be sure to consult the physician who ordered them.  Some, such as Coumadin (Warfarin) are ordered to prevent or treat serious conditions such as "deep thrombosis", "pumonary embolisms", and other heart problems.  The amount of time that you may need off of the medication may  also vary with the medication and the reason for which you were taking it.  If you are taking any of these medications, please make sure you notify your pain physician before you undergo any procedures.Preparing for Procedure with Sedation Instructions: . Oral Intake: Do not eat or drink anything for at least 8 hours prior to your procedure. . Transportation: Public transportation is not allowed.  Bring an adult driver. The driver must be physically present in our waiting room before any procedure can be started. Marland Kitchen Physical Assistance: Bring an adult capable of physically assisting you, in the event you need help. . Blood Pressure Medicine: Take your blood pressure medicine with a sip of water the morning of the procedure. . Insulin: Take only  of your normal insulin dose. . Preventing infections: Shower with an antibacterial soap the morning of your procedure. . Build-up your immune system: Take 1000 mg of Vitamin C with every meal (3 times a day) the day prior to your procedure. . Pregnancy: If you are pregnant, call and cancel the procedure. . Sickness: If you have a cold, fever, or any active infections, call and cancel the procedure. . Arrival: You must be in the facility at least 30 minutes prior to your scheduled procedure. . Children: Do not bring children with you. . Dress appropriately: Bring dark clothing that you would not mind if they get stained. . Valuables: Do not bring any jewelry or valuables. Procedure appointments are reserved for interventional treatments only. Marland Kitchen No Prescription Refills. . No medication changes will be discussed during procedure appointments. No disability issues will be discussed.

## 2017-09-29 NOTE — Progress Notes (Signed)
Nursing Pain Medication Assessment:  Safety precautions to be maintained throughout the outpatient stay will include: orient to surroundings, keep bed in low position, maintain call bell within reach at all times, provide assistance with transfer out of bed and ambulation.  Medication Inspection Compliance: Pill count conducted under aseptic conditions, in front of the patient. Neither the pills nor the bottle was removed from the patient's sight at any time. Once count was completed pills were immediately returned to the patient in their original bottle.  Medication: Hydrocodone/APAP Pill/Patch Count: 9 of 90 pills remain Pill/Patch Appearance: Markings consistent with prescribed medication Bottle Appearance: Standard pharmacy container. Clearly labeled. Filled Date: 07/ 01/ 2019 Last Medication intake:  Today

## 2017-09-30 ENCOUNTER — Other Ambulatory Visit: Payer: Self-pay | Admitting: Family Medicine

## 2017-09-30 DIAGNOSIS — E039 Hypothyroidism, unspecified: Secondary | ICD-10-CM

## 2017-10-01 ENCOUNTER — Encounter: Payer: Self-pay | Admitting: Family Medicine

## 2017-10-01 ENCOUNTER — Ambulatory Visit (INDEPENDENT_AMBULATORY_CARE_PROVIDER_SITE_OTHER): Payer: Medicare Other | Admitting: Family Medicine

## 2017-10-01 VITALS — BP 124/64 | HR 80 | Ht 71.0 in | Wt 246.0 lb

## 2017-10-01 DIAGNOSIS — E782 Mixed hyperlipidemia: Secondary | ICD-10-CM

## 2017-10-01 DIAGNOSIS — I10 Essential (primary) hypertension: Secondary | ICD-10-CM

## 2017-10-01 DIAGNOSIS — K219 Gastro-esophageal reflux disease without esophagitis: Secondary | ICD-10-CM | POA: Diagnosis not present

## 2017-10-01 DIAGNOSIS — E039 Hypothyroidism, unspecified: Secondary | ICD-10-CM

## 2017-10-01 DIAGNOSIS — J45909 Unspecified asthma, uncomplicated: Secondary | ICD-10-CM | POA: Insufficient documentation

## 2017-10-01 DIAGNOSIS — J4520 Mild intermittent asthma, uncomplicated: Secondary | ICD-10-CM | POA: Diagnosis not present

## 2017-10-01 DIAGNOSIS — E876 Hypokalemia: Secondary | ICD-10-CM

## 2017-10-01 MED ORDER — ALBUTEROL SULFATE HFA 108 (90 BASE) MCG/ACT IN AERS: 2.0000 | INHALATION_SPRAY | RESPIRATORY_TRACT | 5 refills | Status: DC | PRN

## 2017-10-01 MED ORDER — LEVOTHYROXINE SODIUM 200 MCG PO TABS: 200.0000 ug | ORAL_TABLET | Freq: Every day | ORAL | 5 refills | Status: DC

## 2017-10-01 MED ORDER — POTASSIUM CHLORIDE CRYS ER 20 MEQ PO TBCR: 20.0000 meq | EXTENDED_RELEASE_TABLET | Freq: Once | ORAL | 5 refills | Status: DC

## 2017-10-01 MED ORDER — FLUTICASONE PROPIONATE 50 MCG/ACT NA SUSP: 1.0000 | Freq: Every day | NASAL | 2 refills | Status: DC

## 2017-10-01 MED ORDER — HYDRALAZINE HCL 50 MG PO TABS: 50.0000 mg | ORAL_TABLET | Freq: Two times a day (BID) | ORAL | 1 refills | Status: DC

## 2017-10-01 MED ORDER — TRIAMTERENE-HCTZ 75-50 MG PO TABS: 1.0000 | ORAL_TABLET | Freq: Every day | ORAL | 5 refills | Status: DC

## 2017-10-01 MED ORDER — RANITIDINE HCL 150 MG PO TABS: 150.0000 mg | ORAL_TABLET | Freq: Two times a day (BID) | ORAL | 5 refills | Status: DC

## 2017-10-01 MED ORDER — OMEPRAZOLE 40 MG PO CPDR: 40.0000 mg | DELAYED_RELEASE_CAPSULE | Freq: Every day | ORAL | 5 refills | Status: DC

## 2017-10-01 NOTE — Assessment & Plan Note (Addendum)
Chronic Stable with potassium supplement 20 Meq daily. Check with renal panel.

## 2017-10-01 NOTE — Assessment & Plan Note (Signed)
Chronic Stable Continue omega 3 otc.

## 2017-10-01 NOTE — Progress Notes (Signed)
Name: Michele Moore   MRN: [id number]    DOB: 1950-05-10   Date:10/01/2017       Progress Note  Subjective  Chief Complaint  Chief Complaint  Patient presents with  . Gastroesophageal Reflux  . Hypertension  . hypokalemia  . Hypothyroidism  . COPD    Gastroesophageal Reflux  She reports no abdominal pain, no belching, no chest pain, no choking, no coughing, no dysphagia, no early satiety, no globus sensation, no heartburn, no hoarse voice, no nausea, no sore throat, no stridor, no tooth decay, no water brash or no wheezing. This is a chronic problem. The current episode started more than 1 year ago. The problem occurs constantly. The problem has been gradually improving. The symptoms are aggravated by certain foods. Pertinent negatives include no anemia, fatigue, melena, muscle weakness, orthopnea or weight loss. There are no known risk factors. She has tried a PPI and a histamine-2 antagonist for the symptoms. The treatment provided moderate relief.  Hypertension  This is a chronic problem. The current episode started more than 1 year ago. The problem is controlled. Pertinent negatives include no anxiety, blurred vision, chest pain, headaches, malaise/fatigue, neck pain, orthopnea, palpitations, peripheral edema, PND, shortness of breath or sweats. There are no associated agents to hypertension. Risk factors for coronary artery disease include diabetes mellitus and dyslipidemia. Past treatments include diuretics (hydalazine). The current treatment provides moderate improvement. There are no compliance problems.  There is no history of angina, kidney disease, CAD/MI, CVA, heart failure, left ventricular hypertrophy, PVD or retinopathy. Identifiable causes of hypertension include a thyroid problem. There is no history of chronic renal disease, a hypertension causing med or renovascular disease.  COPD  There is no chest tightness, cough, difficulty breathing, frequent throat clearing, hemoptysis,  hoarse voice, shortness of breath, sputum production or wheezing. This is a chronic problem. The current episode started yesterday. The problem occurs intermittently. The problem has been unchanged. Associated symptoms include postnasal drip. Pertinent negatives include no chest pain, ear pain, fever, headaches, heartburn, malaise/fatigue, myalgias, orthopnea, PND, sore throat, sweats or weight loss. Her symptoms are alleviated by beta-agonist. She reports moderate improvement on treatment. Her symptoms are not alleviated by beta-agonist. Her past medical history is significant for COPD. There is no history of asthma, bronchiectasis, bronchitis, emphysema or pneumonia.  Thyroid Problem  Presents for follow-up visit. Patient reports no anxiety, cold intolerance, constipation, depressed mood, diaphoresis, diarrhea, dry skin, fatigue, hair loss, heat intolerance, hoarse voice, leg swelling, menstrual problem, nail problem, palpitations, tremors, visual change, weight gain or weight loss. The symptoms have been stable. Her past medical history is significant for hyperlipidemia. There is no history of diabetes or heart failure.  Anemia  Presents for follow-up visit. There has been no abdominal pain, anorexia, bruising/bleeding easily, confusion, fever, leg swelling, light-headedness, malaise/fatigue, palpitations, paresthesias or weight loss. Signs of blood loss that are present include hematochezia. Signs of blood loss that are not present include hematemesis, melena, menorrhagia and vaginal bleeding. Past medical history includes hypothyroidism. There is no history of chronic renal disease or heart failure. There are no compliance problems.   Hyperlipidemia  This is a chronic problem. The current episode started more than 1 year ago. The problem is controlled. Recent lipid tests were reviewed and are normal. Exacerbating diseases include hypothyroidism. She has no history of chronic renal disease, diabetes, liver  disease, obesity or nephrotic syndrome. Pertinent negatives include no chest pain, focal weakness, myalgias or shortness of breath. Treatments tried: lovasa. The  current treatment provides moderate improvement of lipids. There are no compliance problems.     Reactive airway disease Patient has intermitent wheezes relieved by albuterol inhaler 2 puffs qid.  Hypothyroidism Chronic Stable Continue levothyroxin 250mcgm  Daily . Check TSH.  Hypertension Chronic Controlled Continue Hydralazine 50 mg bid and maxide 75/50 daily. Will renal panel.  Gastroesophageal reflux disease Chronic Controlled Continue prilosec 40 mg and zantac 150 mg bid.   Mixed hyperlipidemia Chronic Stable Continue omega 3 otc.  Hypokalemia Chronic Stable with potassium supplement 20 Meq daily. Check with renal panel.   Past Medical History:  Diagnosis Date  . Anemia   . Arthritis   . Chronic kidney disease    STAGE 3 PER DR EASON 08/02/15  . Collagen vascular disease ([place])   . Family history of adverse reaction to anesthesia    sister difficult to put to sleep  . GERD (gastroesophageal reflux disease)   . H/O tooth extraction    all lower teeth 1/19  . Hemorrhoid   . History of hiatal hernia   . Hypertension   . Migraines   . Mixed hyperlipidemia   . Multiple gastric ulcers   . Thyroid disease     Past Surgical History:  Procedure Laterality Date  . ANKLE SURGERY    . CARPAL TUNNEL RELEASE     x3  . COLONOSCOPY  2000?  . FUSION OF TALONAVICULAR JOINT Right 08/03/2015   Procedure: TAILOR NAVICULAR JOINT FUSION - RIGHT ;  Surgeon: [name], DPM;  Location: [facility] ORS;  Service: Podiatry;  Laterality: Right;  . JOINT REPLACEMENT     knee x 3 ,right x1 and left x2  . ROTATOR CUFF REPAIR    . TONSILLECTOMY    . TUBAL LIGATION    . UPPER GI ENDOSCOPY  2000?    Family History  Problem Relation Age of Onset  . COPD Sister   . Cancer Maternal Aunt        breast  . Cancer Maternal Uncle         kidney    Social History   Socioeconomic History  . Marital status: Divorced    Spouse name: Not on file  . Number of children: Not on file  . Years of education: Not on file  . Highest education level: Not on file  Occupational History  . Not on file  Social Needs  . Financial resource strain: Not on file  . Food insecurity:    Worry: Not on file    Inability: Not on file  . Transportation needs:    Medical: Not on file    Non-medical: Not on file  Tobacco Use  . Smoking status: Former Smoker    Last attempt to quit: 02/24/1993    Years since quitting: 24.6  . Smokeless tobacco: Never Used  Substance and Sexual Activity  . Alcohol use: No    Alcohol/week: 0.0 standard drinks  . Drug use: No  . Sexual activity: Not on file  Lifestyle  . Physical activity:    Days per week: Not on file    Minutes per session: Not on file  . Stress: Not on file  Relationships  . Social connections:    Talks on phone: Not on file    Gets together: Not on file    Attends religious service: Not on file    Active member of club or organization: Not on file    Attends meetings of clubs or organizations: Not on file  Relationship status: Not on file  . Intimate partner violence:    Fear of current or ex partner: Not on file    Emotionally abused: Not on file    Physically abused: Not on file    Forced sexual activity: Not on file  Other Topics Concern  . Not on file  Social History Narrative  . Not on file    Allergies  Allergen Reactions  . Penicillins Anaphylaxis and Swelling    Has patient had a PCN reaction causing immediate rash, facial/tongue/throat swelling, SOB or lightheadedness with hypotension: Yes Has patient had a PCN reaction causing severe rash involving mucus membranes or skin necrosis: No Has patient had a PCN reaction that required hospitalization: No Has patient had a PCN reaction occurring within the last 10 years: Yes If all of the above answers are "NO",  then may proceed with Cephalosporin use.  . Vibramycin [Doxycycline Calcium] Rash    Outpatient Medications Prior to Visit  Medication Sig Dispense Refill  . allopurinol (ZYLOPRIM) 100 MG tablet Take 2 tablets by mouth daily. Dr Barb Merino    . aspirin EC 81 MG tablet Take 81 mg by mouth daily.    . Cholecalciferol (VITAMIN D3) 2000 units TABS Take 2,000 Units by mouth daily.    . diclofenac sodium (VOLTAREN) 1 % GEL Apply 2 g topically 3 (three) times daily. [facility]    . ferrous sulfate 325 (65 FE) MG tablet Take 325 mg by mouth daily with breakfast. otc    . fexofenadine-pseudoephedrine (ALLEGRA-D 24) 180-240 MG 24 hr tablet Take 1 tablet by mouth daily as needed (allergies/sinus pressure).     . gabapentin (NEURONTIN) 300 MG capsule TAKE 2 CAPSULES BY MOUTH NIGHTLY/ [place] (Patient taking differently: Take 600 mg by mouth at bedtime. ) 60 capsule 3  . HYDROcodone-acetaminophen (NORCO) 7.5-325 MG tablet Take 1 tablet by mouth 3 (three) times daily as needed for severe pain. For Chronic Pain DNF: 09/30/17, 10/30/17, 11/28/17 90 tablet 0  . ketoconazole (NIZORAL) 2 % cream U UTD QHS (Patient taking differently: Apply 1 application topically at bedtime. U UTD QHS) 30 g 0  . meclizine (ANTIVERT) 12.5 MG tablet Take 1 tablet (12.5 mg total) by mouth 2 (two) times daily as needed for dizziness. 30 tablet 0  . Multiple Vitamins-Calcium (ONE-A-DAY WOMENS FORMULA PO) Take 1 tablet by mouth daily.    . mupirocin cream (BACTROBAN) 2 % Apply 1 application topically 2 (two) times daily. (Patient taking differently: Apply 1 application topically 2 (two) times daily as needed (irritation). ) 15 g 0  . nystatin cream (MYCOSTATIN) Apply 1 application topically 2 (two) times daily. 30 g 0  . omega-3 acid ethyl esters (LOVAZA) 1 g capsule TAKE 2 CAPSULES BY MOUTH TWICE DAILY 120 capsule 0  . Polyethyl Glycol-Propyl Glycol (SYSTANE OP) Place 1 drop into both eyes daily as needed (dry eyes).     .  polyethylene glycol (MIRALAX / GLYCOLAX) packet Take 17 g by mouth daily. (Patient taking differently: Take 17 g by mouth daily as needed for mild constipation. ) 14 each 0  . tiZANidine (ZANAFLEX) 4 MG capsule Take 1 capsule (4 mg total) by mouth 2 (two) times daily as needed. (Patient taking differently: Take 4 mg by mouth 2 (two) times daily as needed for muscle spasms. ) 60 capsule 2  . albuterol (PROVENTIL HFA;VENTOLIN HFA) 108 (90 Base) MCG/ACT inhaler Inhale 2 puffs into the lungs every 4 (four) hours as needed for wheezing.    Marland Kitchen  fluticasone (FLONASE) 50 MCG/ACT nasal spray Place 1 spray into both nostrils daily. 16 g 2  . hydrALAZINE (APRESOLINE) 50 MG tablet TAKE 1 TABLET BY MOUTH TWICE DAILY 60 tablet 1  . levothyroxine (SYNTHROID) 200 MCG tablet Take 1 tablet (200 mcg total) by mouth daily before breakfast. 30 tablet 5  . omeprazole (PRILOSEC) 40 MG capsule Take 1 capsule (40 mg total) by mouth daily. 30 capsule 5  . potassium chloride SA (K-DUR,KLOR-CON) 20 MEQ tablet TAKE 1 TABLET(20 MEQ) BY MOUTH DAILY 30 tablet 4  . ranitidine (ZANTAC) 150 MG tablet Take 1 tablet (150 mg total) by mouth 2 (two) times daily. 60 tablet 5  . triamterene-hydrochlorothiazide (MAXZIDE) 75-50 MG tablet Take 1 tablet by mouth daily. 30 tablet 5   No facility-administered medications prior to visit.     Review of Systems  Constitutional: Negative for chills, diaphoresis, fatigue, fever, malaise/fatigue, weight gain and weight loss.  HENT: Positive for postnasal drip. Negative for ear discharge, ear pain, hoarse voice and sore throat.   Eyes: Negative for blurred vision.  Respiratory: Negative for cough, hemoptysis, sputum production, choking, shortness of breath and wheezing.   Cardiovascular: Negative for chest pain, palpitations, orthopnea, leg swelling and PND.  Gastrointestinal: Positive for hematochezia. Negative for abdominal pain, anorexia, blood in stool, constipation, diarrhea, dysphagia,  heartburn, hematemesis, melena and nausea.  Genitourinary: Negative for dysuria, frequency, hematuria, menorrhagia, menstrual problem, urgency and vaginal bleeding.  Musculoskeletal: Negative for back pain, joint pain, myalgias, muscle weakness and neck pain.  Skin: Negative for rash.  Neurological: Negative for dizziness, tingling, tremors, sensory change, focal weakness, light-headedness, headaches and paresthesias.  Endo/Heme/Allergies: Negative for environmental allergies, cold intolerance, heat intolerance and polydipsia. Does not bruise/bleed easily.  Psychiatric/Behavioral: Negative for confusion, depression and suicidal ideas. The patient is not nervous/anxious and does not have insomnia.      Objective  Vitals:   10/01/17 1349  BP: 124/64  Pulse: 80  Weight: 246 lb (111.6 kg)  Height: 5\' 11"  (1.803 m)    Physical Exam  Constitutional: No distress.  HENT:  Head: Normocephalic and atraumatic.  Right Ear: Hearing, tympanic membrane, external ear and ear canal normal.  Left Ear: Hearing, tympanic membrane, external ear and ear canal normal.  Nose: Nose normal.  Mouth/Throat: Uvula is midline and oropharynx is clear and moist. No oropharyngeal exudate, posterior oropharyngeal edema or posterior oropharyngeal erythema.  Eyes: Pupils are equal, round, and reactive to light. Conjunctivae and EOM are normal. Right eye exhibits no discharge. Left eye exhibits no discharge.  Neck: Normal range of motion. Neck supple. No JVD present. No thyromegaly present.  Cardiovascular: Normal rate, regular rhythm, normal heart sounds and intact distal pulses. Exam reveals no gallop and no friction rub.  No murmur heard. Pulmonary/Chest: Effort normal and breath sounds normal. She has no wheezes. She has no rales.  Abdominal: Soft. Bowel sounds are normal. She exhibits no mass. There is no tenderness. There is no guarding.  Musculoskeletal: Normal range of motion. She exhibits no edema.   Lymphadenopathy:    She has no cervical adenopathy.  Neurological: She is alert. She has normal reflexes.  Skin: Skin is warm and dry. She is not diaphoretic.      Assessment & Plan  Problem List Items Addressed This Visit      Cardiovascular and Mediastinum   Hypertension - Primary    Chronic Controlled Continue Hydralazine 50 mg bid and maxide 75/50 daily. Will renal panel.      Relevant  Medications   hydrALAZINE (APRESOLINE) 50 MG tablet   triamterene-hydrochlorothiazide (MAXZIDE) 75-50 MG tablet   Other Relevant Orders   Renal Function Panel     Respiratory   Reactive airway disease    Patient has intermitent wheezes relieved by albuterol inhaler 2 puffs qid.        Digestive   Gastroesophageal reflux disease    Chronic Controlled Continue prilosec 40 mg and zantac 150 mg bid.       Relevant Medications   omeprazole (PRILOSEC) 40 MG capsule   ranitidine (ZANTAC) 150 MG tablet     Endocrine   Hypothyroidism    Chronic Stable Continue levothyroxin 248mcgm  Daily . Check TSH.      Relevant Medications   levothyroxine (SYNTHROID) 200 MCG tablet   Other Relevant Orders   TSH     Other   Hypokalemia    Chronic Stable with potassium supplement 20 Meq daily. Check with renal panel.      Relevant Medications   potassium chloride SA (K-DUR,KLOR-CON) 20 MEQ tablet   Mixed hyperlipidemia    Chronic Stable Continue omega 3 otc.      Relevant Medications   hydrALAZINE (APRESOLINE) 50 MG tablet   triamterene-hydrochlorothiazide (MAXZIDE) 75-50 MG tablet   Other Relevant Orders   Lipid panel      Meds ordered this encounter  Medications  . albuterol (PROVENTIL HFA;VENTOLIN HFA) 108 (90 Base) MCG/ACT inhaler    Sig: Inhale 2 puffs into the lungs every 4 (four) hours as needed for wheezing.    Dispense:  1 Inhaler    Refill:  5  . fluticasone (FLONASE) 50 MCG/ACT nasal spray    Sig: Place 1 spray into both nostrils daily.    Dispense:  16 g    Refill:   2  . hydrALAZINE (APRESOLINE) 50 MG tablet    Sig: Take 1 tablet (50 mg total) by mouth 2 (two) times daily.    Dispense:  60 tablet    Refill:  1  . levothyroxine (SYNTHROID) 200 MCG tablet    Sig: Take 1 tablet (200 mcg total) by mouth daily before breakfast.    Dispense:  30 tablet    Refill:  5  . omeprazole (PRILOSEC) 40 MG capsule    Sig: Take 1 capsule (40 mg total) by mouth daily.    Dispense:  30 capsule    Refill:  5  . potassium chloride SA (K-DUR,KLOR-CON) 20 MEQ tablet    Sig: Take 1 tablet (20 mEq total) by mouth once for 1 dose.    Dispense:  30 tablet    Refill:  5  . ranitidine (ZANTAC) 150 MG tablet    Sig: Take 1 tablet (150 mg total) by mouth 2 (two) times daily.    Dispense:  60 tablet    Refill:  5  . triamterene-hydrochlorothiazide (MAXZIDE) 75-50 MG tablet    Sig: Take 1 tablet by mouth daily.    Dispense:  30 tablet    Refill:  5      Dr. Otilio Miu [facility]  10/01/17

## 2017-10-01 NOTE — Assessment & Plan Note (Signed)
Chronic Controlled Continue prilosec 40 mg and zantac 150 mg bid.

## 2017-10-01 NOTE — Assessment & Plan Note (Signed)
Chronic Controlled Continue Hydralazine 50 mg bid and maxide 75/50 daily. Will renal panel.

## 2017-10-01 NOTE — Assessment & Plan Note (Signed)
Patient has intermitent wheezes relieved by albuterol inhaler 2 puffs qid.

## 2017-10-01 NOTE — Assessment & Plan Note (Signed)
Chronic Stable Continue levothyroxin 270mcgm  Daily . Check TSH.

## 2017-10-02 ENCOUNTER — Other Ambulatory Visit: Payer: Self-pay

## 2017-10-02 DIAGNOSIS — E876 Hypokalemia: Secondary | ICD-10-CM

## 2017-10-02 LAB — RENAL FUNCTION PANEL
Albumin: 4.2 g/dL (ref 3.6–4.8)
BUN / CREAT RATIO: 15 (ref 12–28)
BUN: 28 mg/dL — AB (ref 8–27)
CALCIUM: 9.9 mg/dL (ref 8.7–10.3)
CHLORIDE: 107 mmol/L — AB (ref 96–106)
CO2: 24 mmol/L (ref 20–29)
Creatinine, Ser: 1.87 mg/dL — ABNORMAL HIGH (ref 0.57–1.00)
GFR calc non Af Amer: 27 mL/min/{1.73_m2} — ABNORMAL LOW (ref >59)
GFR, EST AFRICAN AMERICAN: 31 mL/min/{1.73_m2} — AB (ref >59)
GLUCOSE: 74 mg/dL (ref 65–99)
POTASSIUM: 4.2 mmol/L (ref 3.5–5.2)
Phosphorus: 3 mg/dL (ref 2.5–4.5)
Sodium: 143 mmol/L (ref 134–144)

## 2017-10-02 LAB — TSH: TSH: 2.7 u[IU]/mL (ref 0.450–4.500)

## 2017-10-02 LAB — LIPID PANEL
CHOL/HDL RATIO: 4 ratio (ref 0.0–4.4)
Cholesterol, Total: 211 mg/dL — ABNORMAL HIGH (ref 100–199)
HDL: 53 mg/dL (ref >39)
LDL Calculated: 139 mg/dL — ABNORMAL HIGH (ref 0–99)
Triglycerides: 94 mg/dL (ref 0–149)
VLDL Cholesterol Cal: 19 mg/dL (ref 5–40)

## 2017-10-02 MED ORDER — POTASSIUM CHLORIDE CRYS ER 20 MEQ PO TBCR: 20.0000 meq | EXTENDED_RELEASE_TABLET | Freq: Every day | ORAL | 5 refills | Status: DC

## 2017-10-11 ENCOUNTER — Other Ambulatory Visit: Payer: Self-pay | Admitting: Family Medicine

## 2017-10-11 DIAGNOSIS — Z6835 Body mass index (BMI) 35.0-35.9, adult: Principal | ICD-10-CM

## 2017-10-11 DIAGNOSIS — E785 Hyperlipidemia, unspecified: Secondary | ICD-10-CM

## 2017-10-12 ENCOUNTER — Ambulatory Visit: Payer: Medicare Other | Admitting: Student in an Organized Health Care Education/Training Program

## 2017-10-14 ENCOUNTER — Ambulatory Visit: Payer: Medicare Other

## 2017-10-17 NOTE — Discharge Instructions (Signed)
°  Instructions after Total Knee Replacement ° ° Butch Otterson P. Kealohilani Maiorino, Jr., M.D.    ° Dept. of Orthopaedics & Sports Medicine ° [facility] ° [address] ° [place], [place]  [address] ° Phone: [phone]   Fax: [phone] ° °  °DIET: °• Drink plenty of non-alcoholic fluids. °• Resume your normal diet. Include foods high in fiber. ° °ACTIVITY:  °• You may use crutches or a walker with weight-bearing as tolerated, unless instructed otherwise. °• You may be weaned off of the walker or crutches by your Physical Therapist.  °• Do NOT place pillows under the knee. Anything placed under the knee could limit your ability to straighten the knee.   °• Continue doing gentle exercises. Exercising will reduce the pain and swelling, increase motion, and prevent muscle weakness.   °• Please continue to use the TED compression stockings for 6 weeks. You may remove the stockings at night, but should reapply them in the morning. °• Do not drive or operate any equipment until instructed. ° °WOUND CARE:  °• Continue to use the PolarCare or ice packs periodically to reduce pain and swelling. °• You may bathe or shower after the staples are removed at the first office visit following surgery. ° °MEDICATIONS: °• You may resume your regular medications. °• Please take the pain medication as prescribed on the medication. °• Do not take pain medication on an empty stomach. °• You have been given a prescription for a blood thinner (Lovenox or Coumadin). Please take the medication as instructed. (NOTE: After completing a 2 week course of Lovenox, take one Enteric-coated aspirin once a day. This along with elevation will help reduce the possibility of phlebitis in your operated leg.) °• Do not drive or drink alcoholic beverages when taking pain medications. ° °CALL THE OFFICE FOR: °• Temperature above 101 degrees °• Excessive bleeding or drainage on the dressing. °• Excessive swelling, coldness, or paleness of the toes. °• Persistent  nausea and vomiting. ° °FOLLOW-UP:  °• You should have an appointment to return to the office in 10-14 days after surgery. °• Arrangements have been made for continuation of Physical Therapy (either home therapy or outpatient therapy). °  °

## 2017-10-19 ENCOUNTER — Encounter: Payer: Self-pay | Admitting: Student in an Organized Health Care Education/Training Program

## 2017-10-19 ENCOUNTER — Encounter: Payer: Self-pay | Admitting: *Deleted

## 2017-10-19 ENCOUNTER — Ambulatory Visit (HOSPITAL_BASED_OUTPATIENT_CLINIC_OR_DEPARTMENT_OTHER): Payer: Medicare Other | Admitting: Student in an Organized Health Care Education/Training Program

## 2017-10-19 ENCOUNTER — Ambulatory Visit
Admission: RE | Admit: 2017-10-19 | Discharge: 2017-10-19 | Disposition: A | Discharge status: Home / Self Care | Payer: Medicare Other | Source: Ambulatory Visit | Attending: Student in an Organized Health Care Education/Training Program | Admitting: Student in an Organized Health Care Education/Training Program

## 2017-10-19 ENCOUNTER — Other Ambulatory Visit: Payer: Self-pay

## 2017-10-19 VITALS — BP 118/73 | HR 78 | Temp 97.9°F | Resp 16 | Ht 71.0 in | Wt 238.0 lb

## 2017-10-19 DIAGNOSIS — M5416 Radiculopathy, lumbar region: Secondary | ICD-10-CM | POA: Insufficient documentation

## 2017-10-19 DIAGNOSIS — Z880 Allergy status to penicillin: Secondary | ICD-10-CM | POA: Diagnosis not present

## 2017-10-19 MED ORDER — IOPAMIDOL (ISOVUE-M 200) INJECTION 41%
INTRAMUSCULAR | Status: AC
  Filled 2017-10-19: qty 10

## 2017-10-19 MED ORDER — LACTATED RINGERS IV SOLN
1000.0000 mL | Freq: Once | INTRAVENOUS | Status: AC
  Administered 2017-10-19: 1000 mL via INTRAVENOUS

## 2017-10-19 MED ORDER — FENTANYL CITRATE (PF) 100 MCG/2ML IJ SOLN
25.0000 ug | INTRAMUSCULAR | Status: DC | PRN
  Administered 2017-10-19: 25 ug via INTRAVENOUS

## 2017-10-19 MED ORDER — ROPIVACAINE HCL 2 MG/ML IJ SOLN: 2.0000 mL | Freq: Once | INTRAMUSCULAR | Status: DC

## 2017-10-19 MED ORDER — SODIUM CHLORIDE 0.9 % IJ SOLN
INTRAMUSCULAR | Status: AC
  Filled 2017-10-19: qty 10

## 2017-10-19 MED ORDER — LIDOCAINE HCL 2 % IJ SOLN
INTRAMUSCULAR | Status: AC
  Filled 2017-10-19: qty 20

## 2017-10-19 MED ORDER — SODIUM CHLORIDE 0.9% FLUSH
2.0000 mL | Freq: Once | INTRAVENOUS | Status: AC
  Administered 2017-10-19: 2 mL

## 2017-10-19 MED ORDER — IOPAMIDOL (ISOVUE-M 200) INJECTION 41%
10.0000 mL | Freq: Once | INTRAMUSCULAR | Status: AC
  Administered 2017-10-19: 10 mL via EPIDURAL

## 2017-10-19 MED ORDER — LIDOCAINE HCL 2 % IJ SOLN
10.0000 mL | Freq: Once | INTRAMUSCULAR | Status: AC
  Administered 2017-10-19: 200 mg

## 2017-10-19 MED ORDER — ROPIVACAINE HCL 2 MG/ML IJ SOLN
INTRAMUSCULAR | Status: AC
  Filled 2017-10-19: qty 10

## 2017-10-19 MED ORDER — FENTANYL CITRATE (PF) 100 MCG/2ML IJ SOLN
INTRAMUSCULAR | Status: AC
  Filled 2017-10-19: qty 2

## 2017-10-19 MED ORDER — DEXAMETHASONE SODIUM PHOSPHATE 10 MG/ML IJ SOLN
10.0000 mg | Freq: Once | INTRAMUSCULAR | Status: AC
  Administered 2017-10-19: 10 mg

## 2017-10-19 MED ORDER — DEXAMETHASONE SODIUM PHOSPHATE 10 MG/ML IJ SOLN
INTRAMUSCULAR | Status: AC
  Filled 2017-10-19: qty 1

## 2017-10-19 NOTE — Progress Notes (Signed)
Patient's Name: Michele Moore  MRN: [id number]  Referring Provider: Juline Patch, MD  DOB: 06-12-1950  PCP: Juline Patch, MD  DOS: 10/19/2017  Note by: Gillis Santa, MD  Service setting: Ambulatory outpatient  Specialty: Interventional Pain Management  Patient type: Established  Location: [facility] (AMB) Pain Management Facility  Visit type: Interventional Procedure   Primary Reason for Visit: Interventional Pain Management Treatment. CC: Hip Pain (left)  Procedure:          Anesthesia, Analgesia, Anxiolysis:  Type: Therapeutic Inter-Laminar Epidural Steroid Injection #2  Region: Lumbar Level: L4-5 Level. Laterality: Midline         Type: Moderate (Conscious) Sedation combined with Local Anesthesia Indication(s): Analgesia and Anxiety Route: Intravenous (IV) IV Access: Secured Sedation: Meaningful verbal contact was maintained at all times during the procedure  Local Anesthetic: Lidocaine 1-2%   Indications: 1. Lumbar radiculopathy    Pain Score: Pre-procedure: 8 /10 Post-procedure: 0-No pain/10  Pre-op Assessment:  Michele Moore is a 67 y.o. (year old), female patient, seen today for interventional treatment. She  has a past surgical history that includes Carpal tunnel release; Rotator cuff repair; Ankle surgery; Tubal ligation; Colonoscopy (2000?); Upper gi endoscopy (2000?); Joint replacement; Tonsillectomy; and Fusion of talonavicular joint (Right, 08/03/2015). Michele Moore has a current medication list which includes the following prescription(s): albuterol, allopurinol, aspirin ec, vitamin d3, diclofenac sodium, ferrous sulfate, fexofenadine-pseudoephedrine, fluticasone, gabapentin, hydralazine, hydrocodone-acetaminophen, ketoconazole, levothyroxine, meclizine, multiple vitamins-calcium, mupirocin cream, nystatin cream, omega-3 acid ethyl esters, omeprazole, polyethyl glycol-propyl glycol, polyethylene glycol, potassium chloride sa, ranitidine, tizanidine, triamterene-hydrochlorothiazide, and  levothyroxine, and the following Facility-Administered Medications: fentanyl and ropivacaine (pf) 2 mg/ml (0.2%). Her primarily concern today is the Hip Pain (left)  Initial Vital Signs:  Pulse/HCG Rate: 71ECG Heart Rate: 77 Temp: 98 F (36.7 C) Resp: 18 BP: 103/88 SpO2: 100 %  BMI: Estimated body mass index is 33.19 kg/m as calculated from the following:   Height as of this encounter: 5\' 11"  (1.803 m).   Weight as of this encounter: 238 lb (108 kg).  Risk Assessment: Allergies: Reviewed. She is allergic to penicillins and vibramycin [doxycycline calcium].  Allergy Precautions: None required Coagulopathies: Reviewed. None identified.  Blood-thinner therapy: None at this time Active Infection(s): Reviewed. None identified. Michele Moore is afebrile  Site Confirmation: Michele Moore was asked to confirm the procedure and laterality before marking the site Procedure checklist: Completed Consent: Before the procedure and under the influence of no sedative(s), amnesic(s), or anxiolytics, the patient was informed of the treatment options, risks and possible complications. To fulfill our ethical and legal obligations, as recommended by the American Medical Association's Code of Ethics, I have informed the patient of my clinical impression; the nature and purpose of the treatment or procedure; the risks, benefits, and possible complications of the intervention; the alternatives, including doing nothing; the risk(s) and benefit(s) of the alternative treatment(s) or procedure(s); and the risk(s) and benefit(s) of doing nothing. The patient was provided information about the general risks and possible complications associated with the procedure. These may include, but are not limited to: failure to achieve desired goals, infection, bleeding, organ or nerve damage, allergic reactions, paralysis, and death. In addition, the patient was informed of those risks and complications associated to Spine-related  procedures, such as failure to decrease pain; infection (i.e.: Meningitis, epidural or intraspinal abscess); bleeding (i.e.: epidural hematoma, subarachnoid hemorrhage, or any other type of intraspinal or peri-dural bleeding); organ or nerve damage (i.e.: Any type of peripheral nerve, nerve root,  or spinal cord injury) with subsequent damage to sensory, motor, and/or autonomic systems, resulting in permanent pain, numbness, and/or weakness of one or several areas of the body; allergic reactions; (i.e.: anaphylactic reaction); and/or death. Furthermore, the patient was informed of those risks and complications associated with the medications. These include, but are not limited to: allergic reactions (i.e.: anaphylactic or anaphylactoid reaction(s)); adrenal axis suppression; blood sugar elevation that in diabetics may result in ketoacidosis or comma; water retention that in patients with history of congestive heart failure may result in shortness of breath, pulmonary edema, and decompensation with resultant heart failure; weight gain; swelling or edema; medication-induced neural toxicity; particulate matter embolism and blood vessel occlusion with resultant organ, and/or nervous system infarction; and/or aseptic necrosis of one or more joints. Finally, the patient was informed that Medicine is not an exact science; therefore, there is also the possibility of unforeseen or unpredictable risks and/or possible complications that may result in a catastrophic outcome. The patient indicated having understood very clearly. We have given the patient no guarantees and we have made no promises. Enough time was given to the patient to ask questions, all of which were answered to the patient's satisfaction. Michele Moore has indicated that she wanted to continue with the procedure. Attestation: I, the ordering provider, attest that I have discussed with the patient the benefits, risks, side-effects, alternatives, likelihood of  achieving goals, and potential problems during recovery for the procedure that I have provided informed consent. Date  Time: 10/19/2017  7:54 AM  Pre-Procedure Preparation:  Monitoring: As per clinic protocol. Respiration, ETCO2, SpO2, BP, heart rate and rhythm monitor placed and checked for adequate function Safety Precautions: Patient was assessed for positional comfort and pressure points before starting the procedure. Time-out: I initiated and conducted the "Time-out" before starting the procedure, as per protocol. The patient was asked to participate by confirming the accuracy of the "Time Out" information. Verification of the correct person, site, and procedure were performed and confirmed by me, the nursing staff, and the patient. "Time-out" conducted as per Joint Commission's Universal Protocol (UP.01.01.01). Time: 0833  Description of Procedure:          Position: Prone with head of the table was raised to facilitate breathing. Target Area: The interlaminar space, initially targeting the lower laminar border of the superior vertebral body. Approach: Paramedial approach. Area Prepped: Entire Posterior Lumbar Region Prepping solution: ChloraPrep (2% chlorhexidine gluconate and 70% isopropyl alcohol) Safety Precautions: Aspiration looking for blood return was conducted prior to all injections. At no point did we inject any substances, as a needle was being advanced. No attempts were made at seeking any paresthesias. Safe injection practices and needle disposal techniques used. Medications properly checked for expiration dates. SDV (single dose vial) medications used. Description of the Procedure: Protocol guidelines were followed. The procedure needle was introduced through the skin, ipsilateral to the reported pain, and advanced to the target area. Bone was contacted and the needle walked caudad, until the lamina was cleared. The epidural space was identified using "loss-of-resistance  technique" with 2-3 ml of PF-NaCl (0.9% NSS), in a 5cc LOR glass syringe. Vitals:   10/19/17 0850 10/19/17 0856 10/19/17 0906 10/19/17 0916  BP: 133/72 133/72 123/70 118/73  Pulse:      Resp: 15 15 15 16   Temp: 98.7 F (37.1 C)   97.9 F (36.6 C)  SpO2: 100% 100% 100% 100%  Weight:      Height:        Start  Time: 0833 hrs. End Time: 0844 hrs. Materials:  Needle(s) Type: Epidural needle Gauge: 17G Length: 3.5-in Medication(s): Please see orders for medications and dosing details. 8 CC solution made of 6 cc of preservative-free saline, 1 cc of Decadron 10 mg/cc, 1 cc of 0.2% ropivacaine. Imaging Guidance (Spinal):          Type of Imaging Technique: Fluoroscopy Guidance (Spinal) Indication(s): Assistance in needle guidance and placement for procedures requiring needle placement in or near specific anatomical locations not easily accessible without such assistance. Exposure Time: Please see nurses notes. Contrast: Before injecting any contrast, we confirmed that the patient did not have an allergy to iodine, shellfish, or radiological contrast. Once satisfactory needle placement was completed at the desired level, radiological contrast was injected. Contrast injected under live fluoroscopy. No contrast complications. See chart for type and volume of contrast used. Fluoroscopic Guidance: I was personally present during the use of fluoroscopy. "Tunnel Vision Technique" used to obtain the best possible view of the target area. Parallax error corrected before commencing the procedure. "Direction-depth-direction" technique used to introduce the needle under continuous pulsed fluoroscopy. Once target was reached, antero-posterior, oblique, and lateral fluoroscopic projection used confirm needle placement in all planes. Images permanently stored in EMR. Interpretation: I personally interpreted the imaging intraoperatively. Adequate needle placement confirmed in multiple planes. Appropriate spread of  contrast into desired area was observed. No evidence of afferent or efferent intravascular uptake. No intrathecal or subarachnoid spread observed. Permanent images saved into the patient's record.  Antibiotic Prophylaxis:   Anti-infectives (From admission, onward)   None     Indication(s): None identified  Post-operative Assessment:  Post-procedure Vital Signs:  Pulse/HCG Rate: 7863 Temp: 97.9 F (36.6 C) Resp: 16 BP: 118/73 SpO2: 100 %  EBL: None  Complications: No immediate post-treatment complications observed by team, or reported by patient.  Note: The patient tolerated the entire procedure well. A repeat set of vitals were taken after the procedure and the patient was kept under observation following institutional policy, for this type of procedure. Post-procedural neurological assessment was performed, showing return to baseline, prior to discharge. The patient was provided with post-procedure discharge instructions, including a section on how to identify potential problems. Should any problems arise concerning this procedure, the patient was given instructions to immediately contact us, at any time, without hesitation. In any case, we plan to contact the patient by telephone for a follow-up status report regarding this interventional procedure.  Comments:  No additional relevant information.  Plan of Care    Imaging Orders     DG C-Arm 1-60 Min-No Report Procedure Orders    No procedure(s) ordered today    Medications ordered for procedure: Meds ordered this encounter  Medications  . lactated ringers infusion 1,000 mL  . fentaNYL (SUBLIMAZE) injection 25-100 mcg    Make sure Narcan is available in the pyxis when using this medication. In the event of respiratory depression (RR< 8/min): Titrate NARCAN (naloxone) in increments of 0.1 to 0.2 mg IV at 2-3 minute intervals, until desired degree of reversal.  . iopamidol (ISOVUE-M) 41 % intrathecal injection 10 mL  .  ropivacaine (PF) 2 mg/mL (0.2%) (NAROPIN) injection 2 mL  . sodium chloride flush (NS) 0.9 % injection 2 mL  . lidocaine (XYLOCAINE) 2 % (with pres) injection 200 mg  . dexamethasone (DECADRON) injection 10 mg   Medications administered: We administered lactated ringers, fentaNYL, iopamidol, sodium chloride flush, lidocaine, and dexamethasone.  See the medical record for exact dosing, route,  and time of administration.  New Prescriptions   No medications on file   Disposition: Discharge home  Discharge Date & Time: 10/19/2017; 0917 hrs.   Physician-requested Follow-up: Return in about 3 weeks (around 11/09/2017) for Post Procedure Evaluation.  Future Appointments  Date Time Provider [place]  11/05/2017 11:00 AM [facility]2 [facility] None  11/10/2017 11:30 AM Gillis Santa, MD [facility] None  12/29/2017  1:30 PM Gillis Santa, MD [facility] None  03/01/2018 10:00 AM [place] ADVISOR [facility] None   Primary Care Physician: Juline Patch, MD Location: [facility] Note by: Gillis Santa, MD Date: 10/19/2017; Time: 3:27 PM  Disclaimer:  Medicine is not an exact science. The only guarantee in medicine is that nothing is guaranteed. It is important to note that the decision to proceed with this intervention was based on the information collected from the patient. The Data and conclusions were drawn from the patient's questionnaire, the interview, and the physical examination. Because the information was provided in large part by the patient, it cannot be guaranteed that it has not been purposely or unconsciously manipulated. Every effort has been made to obtain as much relevant data as possible for this evaluation. It is important to note that the conclusions that lead to this procedure are derived in large part from the available data. Always take into account that the treatment will also be dependent on availability of resources and existing treatment  guidelines, considered by other Pain Management Practitioners as being common knowledge and practice, at the time of the intervention. For Medico-Legal purposes, it is also important to point out that variation in procedural techniques and pharmacological choices are the acceptable norm. The indications, contraindications, technique, and results of the above procedure should only be interpreted and judged by a Board-Certified Interventional Pain Specialist with extensive familiarity and expertise in the same exact procedure and technique.

## 2017-10-19 NOTE — Progress Notes (Signed)
Safety precautions to be maintained throughout the outpatient stay will include: orient to surroundings, keep bed in low position, maintain call bell within reach at all times, provide assistance with transfer out of bed and ambulation.  

## 2017-10-19 NOTE — Patient Instructions (Signed)

## 2017-10-20 ENCOUNTER — Telehealth: Payer: Self-pay

## 2017-10-20 NOTE — Telephone Encounter (Signed)
Post procedure phone call.  Patient states she is doing ok.  

## 2017-10-21 DIAGNOSIS — L400 Psoriasis vulgaris: Secondary | ICD-10-CM | POA: Diagnosis not present

## 2017-10-21 DIAGNOSIS — B351 Tinea unguium: Secondary | ICD-10-CM | POA: Diagnosis not present

## 2017-10-21 DIAGNOSIS — B353 Tinea pedis: Secondary | ICD-10-CM | POA: Diagnosis not present

## 2017-10-21 NOTE — Discharge Instructions (Signed)
General Anesthesia, Adult, Care After °These instructions provide you with information about caring for yourself after your procedure. Your health care provider may also give you more specific instructions. Your treatment has been planned according to current medical practices, but problems sometimes occur. Call your health care provider if you have any problems or questions after your procedure. °What can I expect after the procedure? °After the procedure, it is common to have: °· Vomiting. °· A sore throat. °· Mental slowness. ° °It is common to feel: °· Nauseous. °· Cold or shivery. °· Sleepy. °· Tired. °· Sore or achy, even in parts of your body where you did not have surgery. ° °Follow these instructions at home: °For at least 24 hours after the procedure: °· Do not: °? Participate in activities where you could fall or become injured. °? Drive. °? Use heavy machinery. °? Drink alcohol. °? Take sleeping pills or medicines that cause drowsiness. °? Make important decisions or sign legal documents. °? Take care of children on your own. °· Rest. °Eating and drinking °· If you vomit, drink water, juice, or soup when you can drink without vomiting. °· Drink enough fluid to keep your urine clear or pale yellow. °· Make sure you have little or no nausea before eating solid foods. °· Follow the diet recommended by your health care provider. °General instructions °· Have a responsible adult stay with you until you are awake and alert. °· Return to your normal activities as told by your health care provider. Ask your health care provider what activities are safe for you. °· Take over-the-counter and prescription medicines only as told by your health care provider. °· If you smoke, do not smoke without supervision. °· Keep all follow-up visits as told by your health care provider. This is important. °Contact a health care provider if: °· You continue to have nausea or vomiting at home, and medicines are not helpful. °· You  cannot drink fluids or start eating again. °· You cannot urinate after 8-12 hours. °· You develop a skin rash. °· You have fever. °· You have increasing redness at the site of your procedure. °Get help right away if: °· You have difficulty breathing. °· You have chest pain. °· You have unexpected bleeding. °· You feel that you are having a life-threatening or urgent problem. °This information is not intended to replace advice given to you by your health care provider. Make sure you discuss any questions you have with your health care provider. °Document Released: 05/19/2000 Document Revised: 07/16/2015 Document Reviewed: 01/25/2015 °Elsevier Interactive Patient Education © 2018 [employer] ° °

## 2017-10-22 ENCOUNTER — Encounter
Admission: RE | Disposition: A | Discharge status: Home / Self Care | Payer: Self-pay | Source: Ambulatory Visit | Attending: Gastroenterology

## 2017-10-22 ENCOUNTER — Ambulatory Visit: Payer: Medicare Other | Admitting: Anesthesiology

## 2017-10-22 ENCOUNTER — Ambulatory Visit
Admission: RE | Admit: 2017-10-22 | Discharge: 2017-10-22 | Disposition: A | Discharge status: Home / Self Care | Payer: Medicare Other | Source: Ambulatory Visit | Attending: Gastroenterology | Admitting: Gastroenterology

## 2017-10-22 DIAGNOSIS — Z7951 Long term (current) use of inhaled steroids: Secondary | ICD-10-CM | POA: Diagnosis not present

## 2017-10-22 DIAGNOSIS — K319 Disease of stomach and duodenum, unspecified: Secondary | ICD-10-CM | POA: Diagnosis not present

## 2017-10-22 DIAGNOSIS — K2970 Gastritis, unspecified, without bleeding: Secondary | ICD-10-CM | POA: Insufficient documentation

## 2017-10-22 DIAGNOSIS — N183 Chronic kidney disease, stage 3 (moderate): Secondary | ICD-10-CM | POA: Insufficient documentation

## 2017-10-22 DIAGNOSIS — D123 Benign neoplasm of transverse colon: Secondary | ICD-10-CM | POA: Diagnosis not present

## 2017-10-22 DIAGNOSIS — E782 Mixed hyperlipidemia: Secondary | ICD-10-CM | POA: Insufficient documentation

## 2017-10-22 DIAGNOSIS — Z7982 Long term (current) use of aspirin: Secondary | ICD-10-CM | POA: Diagnosis not present

## 2017-10-22 DIAGNOSIS — Z96653 Presence of artificial knee joint, bilateral: Secondary | ICD-10-CM | POA: Insufficient documentation

## 2017-10-22 DIAGNOSIS — R233 Spontaneous ecchymoses: Secondary | ICD-10-CM | POA: Diagnosis not present

## 2017-10-22 DIAGNOSIS — Z79899 Other long term (current) drug therapy: Secondary | ICD-10-CM | POA: Insufficient documentation

## 2017-10-22 DIAGNOSIS — R195 Other fecal abnormalities: Secondary | ICD-10-CM | POA: Diagnosis not present

## 2017-10-22 DIAGNOSIS — D508 Other iron deficiency anemias: Secondary | ICD-10-CM | POA: Diagnosis not present

## 2017-10-22 DIAGNOSIS — D125 Benign neoplasm of sigmoid colon: Secondary | ICD-10-CM | POA: Diagnosis not present

## 2017-10-22 DIAGNOSIS — K2900 Acute gastritis without bleeding: Secondary | ICD-10-CM | POA: Diagnosis not present

## 2017-10-22 DIAGNOSIS — Z881 Allergy status to other antibiotic agents status: Secondary | ICD-10-CM | POA: Diagnosis not present

## 2017-10-22 DIAGNOSIS — D127 Benign neoplasm of rectosigmoid junction: Secondary | ICD-10-CM | POA: Insufficient documentation

## 2017-10-22 DIAGNOSIS — D500 Iron deficiency anemia secondary to blood loss (chronic): Secondary | ICD-10-CM | POA: Diagnosis not present

## 2017-10-22 DIAGNOSIS — E039 Hypothyroidism, unspecified: Secondary | ICD-10-CM | POA: Insufficient documentation

## 2017-10-22 DIAGNOSIS — K635 Polyp of colon: Secondary | ICD-10-CM | POA: Diagnosis not present

## 2017-10-22 DIAGNOSIS — I129 Hypertensive chronic kidney disease with stage 1 through stage 4 chronic kidney disease, or unspecified chronic kidney disease: Secondary | ICD-10-CM | POA: Diagnosis not present

## 2017-10-22 DIAGNOSIS — D509 Iron deficiency anemia, unspecified: Secondary | ICD-10-CM | POA: Diagnosis not present

## 2017-10-22 DIAGNOSIS — Z79890 Hormone replacement therapy (postmenopausal): Secondary | ICD-10-CM | POA: Diagnosis not present

## 2017-10-22 DIAGNOSIS — K640 First degree hemorrhoids: Secondary | ICD-10-CM | POA: Insufficient documentation

## 2017-10-22 DIAGNOSIS — K317 Polyp of stomach and duodenum: Secondary | ICD-10-CM

## 2017-10-22 DIAGNOSIS — D122 Benign neoplasm of ascending colon: Secondary | ICD-10-CM | POA: Diagnosis not present

## 2017-10-22 DIAGNOSIS — Z87891 Personal history of nicotine dependence: Secondary | ICD-10-CM | POA: Insufficient documentation

## 2017-10-22 DIAGNOSIS — Z880 Allergy status to penicillin: Secondary | ICD-10-CM | POA: Insufficient documentation

## 2017-10-22 DIAGNOSIS — K219 Gastro-esophageal reflux disease without esophagitis: Secondary | ICD-10-CM | POA: Insufficient documentation

## 2017-10-22 HISTORY — PX: COLONOSCOPY WITH PROPOFOL: SHX5780

## 2017-10-22 HISTORY — PX: ESOPHAGOGASTRODUODENOSCOPY (EGD) WITH PROPOFOL: SHX5813

## 2017-10-22 HISTORY — PX: POLYPECTOMY: SHX5525

## 2017-10-22 HISTORY — DX: Presence of dental prosthetic device (complete) (partial): Z97.2

## 2017-10-22 SURGERY — COLONOSCOPY WITH PROPOFOL
Anesthesia: General

## 2017-10-22 MED ORDER — GLYCOPYRROLATE 0.2 MG/ML IJ SOLN
INTRAMUSCULAR | Status: DC | PRN
  Administered 2017-10-22: 0.1 mg via INTRAVENOUS

## 2017-10-22 MED ORDER — ACETAMINOPHEN 325 MG PO TABS: 325.0000 mg | ORAL_TABLET | Freq: Once | ORAL | Status: DC

## 2017-10-22 MED ORDER — ACETAMINOPHEN 160 MG/5ML PO SOLN: 325.0000 mg | Freq: Once | ORAL | Status: DC

## 2017-10-22 MED ORDER — STERILE WATER FOR IRRIGATION IR SOLN
Status: DC | PRN
  Administered 2017-10-22: 09:00:00

## 2017-10-22 MED ORDER — PROPOFOL 10 MG/ML IV BOLUS
INTRAVENOUS | Status: DC | PRN
  Administered 2017-10-22: 30 mg via INTRAVENOUS
  Administered 2017-10-22 (×2): 20 mg via INTRAVENOUS
  Administered 2017-10-22: 30 mg via INTRAVENOUS
  Administered 2017-10-22: 10 mg via INTRAVENOUS
  Administered 2017-10-22: 20 mg via INTRAVENOUS
  Administered 2017-10-22: 10 mg via INTRAVENOUS
  Administered 2017-10-22: 100 mg via INTRAVENOUS
  Administered 2017-10-22 (×2): 30 mg via INTRAVENOUS
  Administered 2017-10-22: 20 mg via INTRAVENOUS

## 2017-10-22 MED ORDER — LACTATED RINGERS IV SOLN
INTRAVENOUS | Status: DC
  Administered 2017-10-22 (×2): via INTRAVENOUS

## 2017-10-22 MED ORDER — DEXMEDETOMIDINE HCL 200 MCG/2ML IV SOLN
INTRAVENOUS | Status: DC | PRN
  Administered 2017-10-22: 6 ug via INTRAVENOUS

## 2017-10-22 MED ORDER — LIDOCAINE HCL (CARDIAC) PF 100 MG/5ML IV SOSY
PREFILLED_SYRINGE | INTRAVENOUS | Status: DC | PRN
  Administered 2017-10-22: 40 mg via INTRAVENOUS

## 2017-10-22 MED ORDER — SODIUM CHLORIDE 0.9 % IV SOLN: INTRAVENOUS | Status: DC

## 2017-10-22 SURGICAL SUPPLY — 36 items
BALLN DILATOR 10-12 8 (BALLOONS)
BALLN DILATOR 12-15 8 (BALLOONS)
BALLN DILATOR 15-18 8 (BALLOONS)
BALLN DILATOR CRE 0-12 8 (BALLOONS)
BALLN DILATOR ESOPH 8 10 CRE (MISCELLANEOUS) IMPLANT
BALLOON DILATOR 12-15 8 (BALLOONS) IMPLANT
BALLOON DILATOR 15-18 8 (BALLOONS) IMPLANT
BALLOON DILATOR CRE 0-12 8 (BALLOONS) IMPLANT
BLOCK BITE 60FR ADLT L/F GRN (MISCELLANEOUS) ×3 IMPLANT
CANISTER SUCT 1200ML W/VALVE (MISCELLANEOUS) ×3 IMPLANT
CLIP HMST 235XBRD CATH ROT (MISCELLANEOUS) IMPLANT
CLIP RESOLUTION 360 11X235 (MISCELLANEOUS)
ELECT REM PT RETURN 9FT ADLT (ELECTROSURGICAL)
ELECTRODE REM PT RTRN 9FT ADLT (ELECTROSURGICAL) IMPLANT
FCP ESCP3.2XJMB 240X2.8X (MISCELLANEOUS)
FORCEPS BIOP RAD 4 LRG CAP 4 (CUTTING FORCEPS) IMPLANT
FORCEPS BIOP RJ4 240 W/NDL (MISCELLANEOUS)
FORCEPS ESCP3.2XJMB 240X2.8X (MISCELLANEOUS) IMPLANT
GOWN CVR UNV OPN BCK APRN NK (MISCELLANEOUS) ×2 IMPLANT
GOWN ISOL THUMB LOOP REG UNIV (MISCELLANEOUS) ×6
INJECTOR VARIJECT VIN23 (MISCELLANEOUS) IMPLANT
KIT DEFENDO VALVE AND CONN (KITS) IMPLANT
KIT ENDO PROCEDURE OLY (KITS) ×3 IMPLANT
MARKER SPOT ENDO TATTOO 5ML (MISCELLANEOUS) IMPLANT
PROBE APC STR FIRE (PROBE) IMPLANT
RETRIEVER NET PLAT FOOD (MISCELLANEOUS) IMPLANT
RETRIEVER NET ROTH 2.5X230 LF (MISCELLANEOUS) IMPLANT
SNARE SHORT THROW 13M SML OVAL (MISCELLANEOUS) IMPLANT
SNARE SHORT THROW 30M LRG OVAL (MISCELLANEOUS) ×2 IMPLANT
SNARE SNG USE RND 15MM (INSTRUMENTS) IMPLANT
SPOT EX ENDOSCOPIC TATTOO (MISCELLANEOUS)
SYR INFLATION 60ML (SYRINGE) IMPLANT
TRAP ETRAP POLY (MISCELLANEOUS) IMPLANT
VARIJECT INJECTOR VIN23 (MISCELLANEOUS)
WATER STERILE IRR 250ML POUR (IV SOLUTION) ×3 IMPLANT
WIRE CRE 18-20MM 8CM F G (MISCELLANEOUS) IMPLANT

## 2017-10-22 NOTE — Transfer of Care (Signed)
Immediate Anesthesia Transfer of Care Note  Patient: Michele Moore  Procedure(s) Performed: COLONOSCOPY WITH PROPOFOL (N/A ) ESOPHAGOGASTRODUODENOSCOPY (EGD) WITH PROPOFOL (N/A ) POLYPECTOMY (N/A )  Patient Location: PACU  Anesthesia Type: General  Level of Consciousness: awake, alert  and patient cooperative  Airway and Oxygen Therapy: Patient Spontanous Breathing and Patient connected to supplemental oxygen  Post-op Assessment: Post-op Vital signs reviewed, Patient's Cardiovascular Status Stable, Respiratory Function Stable, Patent Airway and No signs of Nausea or vomiting  Post-op Vital Signs: Reviewed and stable  Complications: No apparent anesthesia complications

## 2017-10-22 NOTE — Anesthesia Preprocedure Evaluation (Signed)
Anesthesia Evaluation  Patient identified by MRN, date of birth, ID band Patient awake    Reviewed: Allergy & Precautions, H&P , NPO status , Patient's Chart, lab work & pertinent test results  Airway Mallampati: II  TM Distance: >3 FB Neck ROM: full    Dental  (+) Edentulous Lower, Partial Upper   Pulmonary former smoker,    Pulmonary exam normal breath sounds clear to auscultation       Cardiovascular hypertension, Normal cardiovascular exam Rhythm:regular Rate:Normal     Neuro/Psych  Neuromuscular disease    GI/Hepatic GERD  ,  Endo/Other  Hypothyroidism   Renal/GU Renal disease     Musculoskeletal   Abdominal   Peds  Hematology  (+) anemia ,   Anesthesia Other Findings   Reproductive/Obstetrics                             Anesthesia Physical Anesthesia Plan  ASA: III  Anesthesia Plan: General   Post-op Pain Management:    Induction: Intravenous  PONV Risk Score and Plan: 3 and Propofol infusion and Treatment may vary due to age or medical condition  Airway Management Planned: Natural Airway  Additional Equipment:   Intra-op Plan:   Post-operative Plan:   Informed Consent: I have reviewed the patients History and Physical, chart, labs and discussed the procedure including the risks, benefits and alternatives for the proposed anesthesia with the patient or authorized representative who has indicated his/her understanding and acceptance.     Plan Discussed with: CRNA  Anesthesia Plan Comments:         Anesthesia Quick Evaluation

## 2017-10-22 NOTE — Anesthesia Procedure Notes (Signed)
Procedure Name: MAC Date/Time: 10/22/2017 9:04 AM Performed by: Janna Arch, CRNA Pre-anesthesia Checklist: Patient identified, Emergency Drugs available, Suction available and Patient being monitored Patient Re-evaluated:Patient Re-evaluated prior to induction Oxygen Delivery Method: Nasal cannula

## 2017-10-22 NOTE — H&P (Signed)
Lucilla Lame, MD [address] [place], [place] [address] [phone] Fax : [phone]  Primary Care Physician:  Juline Patch, MD Primary Gastroenterologist:  Dr. Allen Norris  Pre-Procedure History & Physical: HPI:  Michele Moore is a 67 y.o. female is here for an endoscopy and colonoscopy.   Past Medical History:  Diagnosis Date  . Anemia   . Arthritis   . Chronic kidney disease    STAGE 3 PER DR EASON 08/02/15  . Collagen vascular disease ([place])   . Family history of adverse reaction to anesthesia    sister difficult to put to sleep  . GERD (gastroesophageal reflux disease)   . H/O tooth extraction    all lower teeth 1/19  . Hemorrhoid   . History of hiatal hernia   . Hypertension   . Migraines   . Mixed hyperlipidemia   . Multiple gastric ulcers   . Thyroid disease   . Wears dentures    partial upper and lower    Past Surgical History:  Procedure Laterality Date  . ANKLE SURGERY    . CARPAL TUNNEL RELEASE     x3  . COLONOSCOPY  2000?  . FUSION OF TALONAVICULAR JOINT Right 08/03/2015   Procedure: TAILOR NAVICULAR JOINT FUSION - RIGHT ;  Surgeon: [name], DPM;  Location: [facility] ORS;  Service: Podiatry;  Laterality: Right;  . JOINT REPLACEMENT     knee x 3 ,right x1 and left x2  . ROTATOR CUFF REPAIR    . TONSILLECTOMY    . TUBAL LIGATION    . UPPER GI ENDOSCOPY  2000?    Prior to Admission medications   Medication Sig Start Date End Date Taking? Authorizing Provider  albuterol (PROVENTIL HFA;VENTOLIN HFA) 108 (90 Base) MCG/ACT inhaler Inhale 2 puffs into the lungs every 4 (four) hours as needed for wheezing. 10/01/17  Yes Juline Patch, MD  allopurinol (ZYLOPRIM) 100 MG tablet Take 2 tablets by mouth daily. Dr Barb Merino 08/06/17  Yes [provider]  aspirin EC 81 MG tablet Take 81 mg by mouth daily.   Yes [provider]  Cholecalciferol (VITAMIN D3) 2000 units TABS Take 2,000 Units by mouth daily.   Yes [provider]   diclofenac sodium (VOLTAREN) 1 % GEL Apply 2 g topically 3 (three) times daily. [facility] 08/11/17  Yes [provider]  ferrous sulfate 325 (65 FE) MG tablet Take 325 mg by mouth daily with breakfast. otc   Yes [provider]  fexofenadine-pseudoephedrine (ALLEGRA-D 24) 180-240 MG 24 hr tablet Take 1 tablet by mouth daily as needed (allergies/sinus pressure).    Yes [provider]  fluticasone (FLONASE) 50 MCG/ACT nasal spray Place 1 spray into both nostrils daily. 10/01/17 10/01/18 Yes Juline Patch, MD  gabapentin (NEURONTIN) 300 MG capsule TAKE 2 CAPSULES BY MOUTH NIGHTLY/ [place] Patient taking differently: Take 600 mg by mouth at bedtime.  08/06/17  Yes Gillis Santa, MD  hydrALAZINE (APRESOLINE) 50 MG tablet Take 1 tablet (50 mg total) by mouth 2 (two) times daily. 10/01/17  Yes Juline Patch, MD  HYDROcodone-acetaminophen (NORCO) 7.5-325 MG tablet Take 1 tablet by mouth 3 (three) times daily as needed for severe pain. For Chronic Pain DNF: 09/30/17, 10/30/17, 11/28/17 09/29/17  Yes Gillis Santa, MD  ketoconazole (NIZORAL) 2 % cream U UTD QHS Patient taking differently: Apply 1 application topically at bedtime. U UTD QHS 08/18/17  Yes Juline Patch, MD  levothyroxine (SYNTHROID) 200  MCG tablet Take 1 tablet (200 mcg total) by mouth daily before breakfast. 10/01/17  Yes Juline Patch, MD  meclizine (ANTIVERT) 12.5 MG tablet Take 1 tablet (12.5 mg total) by mouth 2 (two) times daily as needed for dizziness. 09/14/17  Yes Juline Patch, MD  Multiple Vitamins-Calcium (ONE-A-DAY WOMENS FORMULA PO) Take 1 tablet by mouth daily.   Yes [provider]  mupirocin cream (BACTROBAN) 2 % Apply 1 application topically 2 (two) times daily. Patient taking differently: Apply 1 application topically 2 (two) times daily as needed (irritation).  07/05/16  Yes Letitia Neri L, PA-C  nystatin cream (MYCOSTATIN) Apply 1 application topically 2 (two) times daily.  08/18/17  Yes Juline Patch, MD  omega-3 acid ethyl esters (LOVAZA) 1 g capsule TAKE 2 CAPSULES BY MOUTH TWICE DAILY 10/12/17  Yes Juline Patch, MD  omeprazole (PRILOSEC) 40 MG capsule Take 1 capsule (40 mg total) by mouth daily. 10/01/17  Yes Juline Patch, MD  Polyethyl Glycol-Propyl Glycol (SYSTANE OP) Place 1 drop into both eyes daily as needed (dry eyes).    Yes [provider]  polyethylene glycol (MIRALAX / GLYCOLAX) packet Take 17 g by mouth daily. Patient taking differently: Take 17 g by mouth daily as needed for mild constipation.  08/07/15  Yes [name], DPM  potassium chloride SA (K-DUR,KLOR-CON) 20 MEQ tablet Take 1 tablet (20 mEq total) by mouth daily. 10/02/17  Yes Juline Patch, MD  ranitidine (ZANTAC) 150 MG tablet Take 1 tablet (150 mg total) by mouth 2 (two) times daily. 10/01/17  Yes Juline Patch, MD  tiZANidine (ZANAFLEX) 4 MG capsule Take 1 capsule (4 mg total) by mouth 2 (two) times daily as needed. Patient taking differently: Take 4 mg by mouth 2 (two) times daily as needed for muscle spasms.  01/22/17  Yes Gillis Santa, MD  triamterene-hydrochlorothiazide (MAXZIDE) 75-50 MG tablet Take 1 tablet by mouth daily. 10/01/17  Yes Juline Patch, MD  levothyroxine (SYNTHROID, LEVOTHROID) 200 MCG tablet TAKE 1 TABLET BY MOUTH DAILY FOR THYROID Patient not taking: Reported on 10/19/2017 10/02/17   Juline Patch, MD    Allergies as of 09/24/2017 - Review Complete 09/23/2017  Allergen Reaction Noted  . Penicillins Anaphylaxis and Swelling 04/19/2014  . Vibramycin [doxycycline calcium] Rash 04/19/2014    Family History  Problem Relation Age of Onset  . COPD Sister   . Cancer Maternal Aunt        breast  . Cancer Maternal Uncle        kidney    Social History   Socioeconomic History  . Marital status: Divorced    Spouse name: Not on file  . Number of children: Not on file  . Years of education: Not on file  . Highest education level: Not on file    Occupational History  . Not on file  Social Needs  . Financial resource strain: Not on file  . Food insecurity:    Worry: Not on file    Inability: Not on file  . Transportation needs:    Medical: Not on file    Non-medical: Not on file  Tobacco Use  . Smoking status: Former Smoker    Last attempt to quit: 02/24/1993    Years since quitting: 24.6  . Smokeless tobacco: Never Used  Substance and Sexual Activity  . Alcohol use: No    Alcohol/week: 0.0 standard drinks  . Drug use: No  . Sexual activity: Not on file  Lifestyle  . Physical activity:    Days per week: Not on file    Minutes per session: Not on file  . Stress: Not on file  Relationships  . Social connections:    Talks on phone: Not on file    Gets together: Not on file    Attends religious service: Not on file    Active member of club or organization: Not on file    Attends meetings of clubs or organizations: Not on file    Relationship status: Not on file  . Intimate partner violence:    Fear of current or ex partner: Not on file    Emotionally abused: Not on file    Physically abused: Not on file    Forced sexual activity: Not on file  Other Topics Concern  . Not on file  Social History Narrative  . Not on file    Review of Systems: See HPI, otherwise negative ROS  Physical Exam: BP 126/60   Pulse 74   Temp (!) 97.5 F (36.4 C)   Resp 16   Ht 5\' 11"  (1.803 m)   Wt 111.1 kg   SpO2 98%   BMI 34.17 kg/m  General:   Alert,  pleasant and cooperative in NAD Head:  Normocephalic and atraumatic. Neck:  Supple; no masses or thyromegaly. Lungs:  Clear throughout to auscultation.    Heart:  Regular rate and rhythm. Abdomen:  Soft, nontender and nondistended. Normal bowel sounds, without guarding, and without rebound.   Neurologic:  Alert and  oriented x4;  grossly normal neurologically.  Impression/Plan: Michele Moore is here for an endoscopy and colonoscopy to be performed for heme positive stools  and anemia  Risks, benefits, limitations, and alternatives regarding  endoscopy and colonoscopy have been reviewed with the patient.  Questions have been answered.  All parties agreeable.   Lucilla Lame, MD  10/22/2017, 8:58 AM

## 2017-10-22 NOTE — Anesthesia Postprocedure Evaluation (Signed)
Anesthesia Post Note  Patient: Michele Moore  Procedure(s) Performed: COLONOSCOPY WITH PROPOFOL (N/A ) ESOPHAGOGASTRODUODENOSCOPY (EGD) WITH PROPOFOL (N/A ) POLYPECTOMY (N/A )  Patient location during evaluation: PACU Anesthesia Type: General Level of consciousness: awake and alert and oriented Pain management: satisfactory to patient Vital Signs Assessment: post-procedure vital signs reviewed and stable Respiratory status: spontaneous breathing, nonlabored ventilation and respiratory function stable Cardiovascular status: blood pressure returned to baseline and stable Postop Assessment: Adequate PO intake and No signs of nausea or vomiting Anesthetic complications: no    Raliegh Ip

## 2017-10-22 NOTE — Op Note (Signed)
[facility] Gastroenterology Patient Name: Michele Moore Procedure Date: 10/22/2017 9:16 AM MRN: [id number] Account #: [id number] Date of Birth: 05/25/1950 Admit Type: Outpatient Age: 67 Room: [facility] OR ROOM 01 Gender: Female Note Status: Finalized Procedure:            Colonoscopy Indications:          Heme positive stool, Iron deficiency anemia Providers:            Lucilla Lame MD, MD Referring MD:         Juline Patch, MD (Referring MD) Medicines:            Monitored Anesthesia Care Complications:        No immediate complications. Procedure:            Pre-Anesthesia Assessment:                       - Prior to the procedure, a History and Physical was                        performed, and patient medications and allergies were                        reviewed. The patient's tolerance of previous                        anesthesia was also reviewed. The risks and benefits of                        the procedure and the sedation options and risks were                        discussed with the patient. All questions were                        answered, and informed consent was obtained. Prior                        Anticoagulants: The patient has taken no previous                        anticoagulant or antiplatelet agents. ASA Grade                        Assessment: II - A patient with mild systemic disease.                        After reviewing the risks and benefits, the patient was                        deemed in satisfactory condition to undergo the                        procedure.                       After obtaining informed consent, the colonoscope was                        passed under direct vision. Throughout the procedure,  the patient's blood pressure, pulse, and oxygen                        saturations were monitored continuously. The was                        introduced through the anus and advanced to the the                 cecum, identified by appendiceal orifice and ileocecal                        valve. The colonoscopy was performed without                        difficulty. The patient tolerated the procedure well.                        The quality of the bowel preparation was excellent. Findings:      The perianal and digital rectal examinations were normal.      A 7 mm polyp was found in the ascending colon. The polyp was sessile.       The polyp was removed with a cold biopsy forceps. Resection and       retrieval were complete.      A 3 mm polyp was found in the transverse colon. The polyp was sessile.       The polyp was removed with a cold biopsy forceps. Resection and       retrieval were complete.      Three sessile polyps were found in the sigmoid colon. The polyps were 1       to 4 mm in size. These polyps were removed with a cold biopsy forceps.       Resection and retrieval were complete.      Non-bleeding internal hemorrhoids were found during retroflexion. The       hemorrhoids were Grade I (internal hemorrhoids that do not prolapse).      Multiple petechiae were found in the rectum. Biopsies were taken with a       cold forceps for histology. Impression:           - One 7 mm polyp in the ascending colon, removed with a                        cold biopsy forceps. Resected and retrieved.                       - One 3 mm polyp in the transverse colon, removed with                        a cold biopsy forceps. Resected and retrieved.                       - Three 1 to 4 mm polyps in the sigmoid colon, removed                        with a cold biopsy forceps. Resected and retrieved.                       - Non-bleeding internal hemorrhoids.                       -  Petechia(e) in the rectum. Biopsied. Recommendation:       - Discharge patient to home.                       - Resume previous diet.                       - Continue present medications.                       -  Await pathology results.                       - Repeat colonoscopy in 5 years if polyp adenoma and 10                        years if hyperplastic Procedure Code(s):    --- Professional ---                       [phone], Colonoscopy, flexible; with biopsy, single or                        multiple Diagnosis Code(s):    --- Professional ---                       D50.9, Iron deficiency anemia, unspecified                       R19.5, Other fecal abnormalities                       D12.2, Benign neoplasm of ascending colon                       D12.3, Benign neoplasm of transverse colon (hepatic                        flexure or splenic flexure)                       D12.5, Benign neoplasm of sigmoid colon CPT copyright 2017 American Medical Association. All rights reserved. The codes documented in this report are preliminary and upon coder review may  be revised to meet current compliance requirements. Lucilla Lame MD, MD 10/22/2017 9:34:12 AM This report has been signed electronically. Number of Addenda: 0 Note Initiated On: 10/22/2017 9:16 AM Scope Withdrawal Time: 0 hours 7 minutes 32 seconds  Total Procedure Duration: 0 hours 11 minutes 29 seconds       [facility]

## 2017-10-22 NOTE — Op Note (Signed)
[facility] Gastroenterology Patient Name: Michele Moore Procedure Date: 10/22/2017 9:04 AM MRN: [id number] Account #: [id number] Date of Birth: 12-11-1950 Admit Type: Outpatient Age: 67 Room: [facility] OR ROOM 01 Gender: Female Note Status: Finalized Procedure:            Upper GI endoscopy Indications:          Iron deficiency anemia, Heme positive stool Providers:            Lucilla Lame MD, MD Referring MD:         Ferd Hibbs, MD (Referring MD), Juline Patch, MD                        (Referring MD) Medicines:            Propofol per Anesthesia Complications:        No immediate complications. Procedure:            Pre-Anesthesia Assessment:                       - Prior to the procedure, a History and Physical was                        performed, and patient medications and allergies were                        reviewed. The patient's tolerance of previous                        anesthesia was also reviewed. The risks and benefits of                        the procedure and the sedation options and risks were                        discussed with the patient. All questions were                        answered, and informed consent was obtained. Prior                        Anticoagulants: The patient has taken no previous                        anticoagulant or antiplatelet agents. ASA Grade                        Assessment: II - A patient with mild systemic disease.                        After reviewing the risks and benefits, the patient was                        deemed in satisfactory condition to undergo the                        procedure.                       After obtaining informed consent, the endoscope was  passed under direct vision. Throughout the procedure,                        the patient's blood pressure, pulse, and oxygen                        saturations were monitored continuously. The was    introduced through the mouth, and advanced to the                        second part of duodenum. The upper GI endoscopy was                        accomplished without difficulty. The patient tolerated                        the procedure well. Findings:      The examined esophagus was normal.      Scattered moderate inflammation characterized by erythema was found in       the gastric antrum. Biopsies were taken with a cold forceps for       histology.      A single 12 mm sessile polyp with no bleeding and no stigmata of recent       bleeding was found in the gastric antrum. Biopsies were taken with a       cold forceps for histology.      The examined duodenum was normal. Impression:           - Normal esophagus.                       - Gastritis. Biopsied.                       - A single gastric polyp. Biopsied.                       - Normal examined duodenum. Recommendation:       - Discharge patient to home.                       - Resume previous diet.                       - Continue present medications.                       - Await pathology results.                       - Perform a colonoscopy today. Procedure Code(s):    --- Professional ---                       [phone], Esophagogastroduodenoscopy, flexible, transoral;                        with biopsy, single or multiple Diagnosis Code(s):    --- Professional ---                       D50.9, Iron deficiency anemia, unspecified                       R19.5,  Other fecal abnormalities                       K31.7, Polyp of stomach and duodenum                       K29.70, Gastritis, unspecified, without bleeding CPT copyright 2017 American Medical Association. All rights reserved. The codes documented in this report are preliminary and upon coder review may  be revised to meet current compliance requirements. Lucilla Lame MD, MD 10/22/2017 9:16:21 AM This report has been signed electronically. Number of Addenda: 0 Note  Initiated On: 10/22/2017 9:04 AM      [facility]

## 2017-10-23 ENCOUNTER — Encounter: Payer: Self-pay | Admitting: Gastroenterology

## 2017-10-28 ENCOUNTER — Encounter: Payer: Self-pay | Admitting: Gastroenterology

## 2017-10-29 ENCOUNTER — Encounter: Payer: Self-pay | Admitting: Gastroenterology

## 2017-11-05 ENCOUNTER — Other Ambulatory Visit: Payer: Self-pay

## 2017-11-05 ENCOUNTER — Encounter
Admission: RE | Admit: 2017-11-05 | Discharge: 2017-11-05 | Disposition: A | Discharge status: Home / Self Care | Payer: Medicare Other | Source: Ambulatory Visit | Attending: Orthopedic Surgery | Admitting: Orthopedic Surgery

## 2017-11-05 DIAGNOSIS — Z01812 Encounter for preprocedural laboratory examination: Secondary | ICD-10-CM | POA: Insufficient documentation

## 2017-11-05 HISTORY — DX: Unspecified asthma, uncomplicated: J45.909

## 2017-11-05 HISTORY — DX: Hypothyroidism, unspecified: E03.9

## 2017-11-05 LAB — COMPREHENSIVE METABOLIC PANEL
ALBUMIN: 4.2 g/dL (ref 3.5–5.0)
ALK PHOS: 87 U/L (ref 38–126)
ALT: 16 U/L (ref 0–44)
ANION GAP: 6 (ref 5–15)
AST: 23 U/L (ref 15–41)
BILIRUBIN TOTAL: 0.4 mg/dL (ref 0.3–1.2)
BUN: 36 mg/dL — ABNORMAL HIGH (ref 8–23)
CALCIUM: 9.9 mg/dL (ref 8.9–10.3)
CO2: 27 mmol/L (ref 22–32)
Chloride: 109 mmol/L (ref 98–111)
Creatinine, Ser: 2 mg/dL — ABNORMAL HIGH (ref 0.44–1.00)
GFR calc Af Amer: 28 mL/min — ABNORMAL LOW (ref >60)
GFR calc non Af Amer: 24 mL/min — ABNORMAL LOW (ref >60)
Glucose, Bld: 98 mg/dL (ref 70–99)
Potassium: 5.2 mmol/L — ABNORMAL HIGH (ref 3.5–5.1)
Sodium: 142 mmol/L (ref 135–145)
TOTAL PROTEIN: 7.5 g/dL (ref 6.5–8.1)

## 2017-11-05 LAB — C-REACTIVE PROTEIN

## 2017-11-05 LAB — URINALYSIS, ROUTINE W REFLEX MICROSCOPIC
Bilirubin Urine: NEGATIVE
Glucose, UA: NEGATIVE mg/dL
HGB URINE DIPSTICK: NEGATIVE
Ketones, ur: NEGATIVE mg/dL
LEUKOCYTES UA: NEGATIVE
NITRITE: NEGATIVE
Protein, ur: NEGATIVE mg/dL
SPECIFIC GRAVITY, URINE: 1.017 (ref 1.005–1.030)
pH: 5 (ref 5.0–8.0)

## 2017-11-05 LAB — CBC
HEMATOCRIT: 32.9 % — AB (ref 35.0–47.0)
HEMOGLOBIN: 10.9 g/dL — AB (ref 12.0–16.0)
MCH: 29.3 pg (ref 26.0–34.0)
MCHC: 33.1 g/dL (ref 32.0–36.0)
MCV: 88.3 fL (ref 80.0–100.0)
Platelets: 151 10*3/uL (ref 150–440)
RBC: 3.73 MIL/uL — ABNORMAL LOW (ref 3.80–5.20)
RDW: 15.2 % — ABNORMAL HIGH (ref 11.5–14.5)
WBC: 4.4 10*3/uL (ref 3.6–11.0)

## 2017-11-05 LAB — SURGICAL PCR SCREEN
MRSA, PCR: NEGATIVE
Staphylococcus aureus: NEGATIVE

## 2017-11-05 LAB — APTT: APTT: 34 s (ref 24–36)

## 2017-11-05 LAB — PROTIME-INR
INR: 0.94
Prothrombin Time: 12.5 seconds (ref 11.4–15.2)

## 2017-11-05 LAB — TYPE AND SCREEN
ABO/RH(D): O POS
Antibody Screen: NEGATIVE

## 2017-11-05 LAB — SEDIMENTATION RATE: Sed Rate: 52 mm/hr — ABNORMAL HIGH (ref 0–30)

## 2017-11-05 NOTE — Patient Instructions (Signed)
Your procedure is scheduled on: Wed. 9/25 Report to Day Surgery. To find out your arrival time please call [phone] between 1PM - 3PM on Tues 9/24  Remember: Instructions that are not followed completely may result in serious medical risk,  up to and including death, or upon the discretion of your surgeon and anesthesiologist your  surgery may need to be rescheduled.     _X__ 1. Do not eat food after midnight the night before your procedure.                 No gum chewing or hard candies. You may drink clear liquids up to 2 hours                 before you are scheduled to arrive for your surgery- DO not drink clear                 liquids within 2 hours of the start of your surgery.                 Clear Liquids include:  water, apple juice without pulp, clear carbohydrate                 drink such as Clearfast of Gatorade, Black Coffee or Tea (Do not add                 anything to coffee or tea).  __X__2.  On the morning of surgery brush your teeth with toothpaste and water, you                may rinse your mouth with mouthwash if you wish.  Do not swallow any toothpaste of mouthwash.     ___ 3.  No Alcohol for 24 hours before or after surgery.   ___ 4.  Do Not Smoke or use e-cigarettes For 24 Hours Prior to Your Surgery.                 Do not use any chewable tobacco products for at least 6 hours prior to                 surgery.  ____  5.  Bring all medications with you on the day of surgery if instructed.   __x__  6.  Notify your doctor if there is any change in your medical condition      (cold, fever, infections).     Do not wear jewelry, make-up, hairpins, clips or nail polish. Do not wear lotions, powders, or perfumes. You may wear deodorant. Do not shave 48 hours prior to surgery. Men may shave face and neck. Do not bring valuables to the hospital.    [facility] is not responsible for any belongings or valuables.  Contacts, dentures or  bridgework may not be worn into surgery. Leave your suitcase in the car. After surgery it may be brought to your room. For patients admitted to the hospital, discharge time is determined by your treatment team.   Patients discharged the day of surgery will not be allowed to drive home.   Please read over the following fact sheets that you were given:    _x___ Take these medicines the morning of surgery with A SIP OF WATER:    1. allopurinol (ZYLOPRIM) 100 MG tablet  2. hydrALAZINE (APRESOLINE) 50 MG tablet  3.levothyroxine (SYNTHROID) 200 MCG tablet   4.omeprazole (PRILOSEC) 40 MG capsule  5.ranitidine (ZANTAC) 150 MG tablet  6.tiZANidine (ZANAFLEX) 4  MG capsule             7.fluticasone (FLONASE) 50 MCG/ACT nasal spray  ____ Fleet Enema (as directed)   ____ Use CHG Soap as directed  __x__ Use inhalers on the day of surgery albuterol (PROVENTIL HFA;VENTOLIN HFA) 108 (90 Base) MCG/ACT inhaler and bring with you  ____ Stop metformin 2 days prior to surgery    ____ Take 1/2 of usual insulin dose the night before surgery. No insulin the morning          of surgery.   __x__ Stop aspirin on 9/18  ____ Stop Anti-inflammatories on    __x__ Stop supplements until after surgery. omega-3 acid ethyl esters (LOVAZA) 1 g capsule Last dose on 9/18  ____ Bring C-Pap to the hospital.

## 2017-11-06 LAB — URINE CULTURE
Culture: NO GROWTH
Special Requests: NORMAL

## 2017-11-08 LAB — IGE: IGE (IMMUNOGLOBULIN E), SERUM: 10 [IU]/mL (ref 6–495)

## 2017-11-09 ENCOUNTER — Other Ambulatory Visit: Payer: Self-pay

## 2017-11-09 DIAGNOSIS — E876 Hypokalemia: Secondary | ICD-10-CM

## 2017-11-09 MED ORDER — POTASSIUM CHLORIDE CRYS ER 20 MEQ PO TBCR: 10.0000 meq | EXTENDED_RELEASE_TABLET | Freq: Every day | ORAL | 5 refills | Status: DC

## 2017-11-10 ENCOUNTER — Ambulatory Visit: Payer: Medicare Other | Admitting: Student in an Organized Health Care Education/Training Program

## 2017-11-10 ENCOUNTER — Other Ambulatory Visit: Payer: Self-pay | Admitting: Family Medicine

## 2017-11-10 DIAGNOSIS — Z6835 Body mass index (BMI) 35.0-35.9, adult: Principal | ICD-10-CM

## 2017-11-10 DIAGNOSIS — E785 Hyperlipidemia, unspecified: Secondary | ICD-10-CM

## 2017-11-16 ENCOUNTER — Encounter: Payer: Self-pay | Admitting: Family Medicine

## 2017-11-16 ENCOUNTER — Ambulatory Visit (INDEPENDENT_AMBULATORY_CARE_PROVIDER_SITE_OTHER): Payer: Medicare Other | Admitting: Family Medicine

## 2017-11-16 VITALS — BP 120/80 | HR 80 | Ht 71.0 in | Wt 249.0 lb

## 2017-11-16 DIAGNOSIS — J0100 Acute maxillary sinusitis, unspecified: Secondary | ICD-10-CM

## 2017-11-16 DIAGNOSIS — B373 Candidiasis of vulva and vagina: Secondary | ICD-10-CM | POA: Diagnosis not present

## 2017-11-16 DIAGNOSIS — B3731 Acute candidiasis of vulva and vagina: Secondary | ICD-10-CM

## 2017-11-16 DIAGNOSIS — B356 Tinea cruris: Secondary | ICD-10-CM

## 2017-11-16 MED ORDER — NYSTATIN 100000 UNIT/GM EX CREA: 1.0000 "application " | TOPICAL_CREAM | Freq: Two times a day (BID) | CUTANEOUS | 0 refills | Status: DC

## 2017-11-16 MED ORDER — AZITHROMYCIN 250 MG PO TABS: ORAL_TABLET | ORAL | 0 refills | Status: DC

## 2017-11-16 NOTE — Progress Notes (Signed)
Date:  11/16/2017   Name:  Michele Moore   DOB:  Apr 23, 1950   MRN:  [id number]   Chief Complaint: Rash (was on diflucan 3 times a week- out of nystatin cream) Rash  The current episode started 1 to 4 weeks ago. The problem has been waxing and waning since onset. The affected locations include the abdomen, groin and genitalia. The rash is characterized by redness and itchiness. Pertinent negatives include no congestion, cough, diarrhea, eye pain, fatigue, fever, rhinorrhea, shortness of breath, sore throat or vomiting. Past treatments include topical steroids (nystatin). The treatment provided moderate relief.  Sinusitis  This is a new problem. The current episode started in the past 7 days. The problem has been gradually worsening since onset. There has been no fever. The fever has been present for 3 to 4 days. Pertinent negatives include no chills, congestion, coughing, diaphoresis, ear pain, headaches, hoarse voice, neck pain, shortness of breath, sinus pressure, sneezing or sore throat.    Review of Systems  Constitutional: Negative.  Negative for chills, diaphoresis, fatigue, fever and unexpected weight change.  HENT: Negative for congestion, ear discharge, ear pain, hoarse voice, rhinorrhea, sinus pressure, sneezing and sore throat.   Eyes: Negative for photophobia, pain, discharge, redness and itching.  Respiratory: Negative for cough, shortness of breath, wheezing and stridor.   Gastrointestinal: Negative for abdominal pain, blood in stool, constipation, diarrhea, nausea and vomiting.  Endocrine: Negative for cold intolerance, heat intolerance, polydipsia, polyphagia and polyuria.  Genitourinary: Negative for dysuria, flank pain, frequency, hematuria, menstrual problem, pelvic pain, urgency, vaginal bleeding and vaginal discharge.  Musculoskeletal: Negative for arthralgias, back pain, myalgias and neck pain.  Skin: Negative for rash.  Allergic/Immunologic: Negative for environmental  allergies and food allergies.  Neurological: Negative for dizziness, weakness, light-headedness, numbness and headaches.  Hematological: Negative for adenopathy. Does not bruise/bleed easily.  Psychiatric/Behavioral: Negative for dysphoric mood. The patient is not nervous/anxious.     Patient Active Problem List   Diagnosis Date Noted  . Iron deficiency anemia due to chronic blood loss   . Heme + stool   . Acute gastritis without hemorrhage   . Gastric polyps   . Benign neoplasm of ascending colon   . Benign neoplasm of transverse colon   . Polyp of sigmoid colon   . Hypertension 10/01/2017  . Hypothyroidism 10/01/2017  . Gastroesophageal reflux disease 10/01/2017  . Hypokalemia 10/01/2017  . Mixed hyperlipidemia 10/01/2017  . Reactive airway disease 10/01/2017  . Bradycardia 08/24/2017  . Severe obesity (BMI 35.0-35.9 with comorbidity) ([place]) 08/18/2017  . Cervical facet syndrome 06/16/2017  . Osteoarthritis of right shoulder due to rotator cuff injury 06/16/2017  . Sprain of anterior talofibular ligament of right ankle 06/16/2017  . Lumbar radiculopathy 04/21/2017  . Lumbar degenerative disc disease 04/21/2017  . Chronic pain syndrome 04/21/2017  . Spinal stenosis, lumbar region, with neurogenic claudication 04/21/2017  . Pain, lower extremity 08/03/2015    Allergies  Allergen Reactions  . Penicillins Anaphylaxis and Swelling    Has patient had a PCN reaction causing immediate rash, facial/tongue/throat swelling, SOB or lightheadedness with hypotension: Yes Has patient had a PCN reaction causing severe rash involving mucus membranes or skin necrosis: No Has patient had a PCN reaction that required hospitalization: No Has patient had a PCN reaction occurring within the last 10 years: Yes If all of the above answers are "NO", then may proceed with Cephalosporin use.  . Vibramycin [Doxycycline Calcium] Rash    Past Surgical History:  Procedure Laterality Date  . ANKLE  SURGERY    . CARPAL TUNNEL RELEASE     x3  . COLONOSCOPY  2000?  . COLONOSCOPY WITH PROPOFOL N/A 10/22/2017   Procedure: COLONOSCOPY WITH PROPOFOL;  Surgeon: Lucilla Lame, MD;  Location: [facility];  Service: Endoscopy;  Laterality: N/A;  . ESOPHAGOGASTRODUODENOSCOPY (EGD) WITH PROPOFOL N/A 10/22/2017   Procedure: ESOPHAGOGASTRODUODENOSCOPY (EGD) WITH PROPOFOL;  Surgeon: Lucilla Lame, MD;  Location: [place];  Service: Endoscopy;  Laterality: N/A;  . FUSION OF TALONAVICULAR JOINT Right 08/03/2015   Procedure: TAILOR NAVICULAR JOINT FUSION - RIGHT ;  Surgeon: [name], DPM;  Location: [facility];  Service: Podiatry;  Laterality: Right;  . JOINT REPLACEMENT     knee x 3 ,right x1 and left x2  . POLYPECTOMY N/A 10/22/2017   Procedure: POLYPECTOMY;  Surgeon: Lucilla Lame, MD;  Location: [place];  Service: Endoscopy;  Laterality: N/A;  . ROTATOR CUFF REPAIR    . TONSILLECTOMY    . TUBAL LIGATION    . UPPER GI ENDOSCOPY  2000?    Social History   Tobacco Use  . Smoking status: Former Smoker    Last attempt to quit: 02/24/1993    Years since quitting: 24.7  . Smokeless tobacco: Never Used  Substance Use Topics  . Alcohol use: No    Alcohol/week: 0.0 standard drinks  . Drug use: No     Medication list has been reviewed and updated.  Current Meds  Medication Sig  . albuterol (PROVENTIL HFA;VENTOLIN HFA) 108 (90 Base) MCG/ACT inhaler Inhale 2 puffs into the lungs every 4 (four) hours as needed for wheezing.  Marland Kitchen allopurinol (ZYLOPRIM) 100 MG tablet Take 2 tablets by mouth daily. Dr Barb Merino  . aspirin EC 81 MG tablet Take 81 mg by mouth daily.  . Cholecalciferol (VITAMIN D3) 2000 units TABS Take 2,000 Units by mouth daily.  . diclofenac sodium (VOLTAREN) 1 % GEL Apply 2 g topically 3 (three) times daily. [facility]  . ferrous sulfate 325 (65 FE) MG tablet Take 325 mg by mouth daily with breakfast. otc  . fluticasone (FLONASE) 50 MCG/ACT nasal  spray Place 1 spray into both nostrils daily.  Marland Kitchen gabapentin (NEURONTIN) 300 MG capsule TAKE 2 CAPSULES BY MOUTH NIGHTLY/ [place] (Patient taking differently: Take 600 mg by mouth at bedtime. )  . hydrALAZINE (APRESOLINE) 50 MG tablet Take 1 tablet (50 mg total) by mouth 2 (two) times daily.  Marland Kitchen HYDROcodone-acetaminophen (NORCO) 7.5-325 MG tablet Take 1 tablet by mouth 3 (three) times daily as needed for severe pain. For Chronic Pain DNF: 09/30/17, 10/30/17, 11/28/17  . ketoconazole (NIZORAL) 2 % cream U UTD QHS (Patient taking differently: Apply 1 application topically at bedtime. U UTD QHS)  . levothyroxine (SYNTHROID) 200 MCG tablet Take 1 tablet (200 mcg total) by mouth daily before breakfast.  . meclizine (ANTIVERT) 12.5 MG tablet Take 1 tablet (12.5 mg total) by mouth 2 (two) times daily as needed for dizziness.  . Multiple Vitamins-Calcium (ONE-A-DAY WOMENS FORMULA PO) Take 1 tablet by mouth daily.  Marland Kitchen nystatin cream (MYCOSTATIN) Apply 1 application topically 2 (two) times daily.  Marland Kitchen omega-3 acid ethyl esters (LOVAZA) 1 g capsule TAKE 2 CAPSULES BY MOUTH TWICE DAILY  . omeprazole (PRILOSEC) 40 MG capsule Take 1 capsule (40 mg total) by mouth daily.  Vladimir Faster Glycol-Propyl Glycol (SYSTANE OP) Place 1 drop into both eyes daily as needed (dry eyes).   . polyethylene glycol (MIRALAX / GLYCOLAX)  packet Take 17 g by mouth daily. (Patient taking differently: Take 17 g by mouth daily as needed for mild constipation. )  . potassium chloride SA (K-DUR,KLOR-CON) 20 MEQ tablet Take 0.5 tablets (10 mEq total) by mouth daily.  Marland Kitchen Propylene Glycol (SYSTANE BALANCE OP) Apply 1 drop to eye 2 (two) times daily.  . ranitidine (ZANTAC) 150 MG tablet Take 1 tablet (150 mg total) by mouth 2 (two) times daily.  Marland Kitchen tiZANidine (ZANAFLEX) 4 MG capsule Take 1 capsule (4 mg total) by mouth 2 (two) times daily as needed. (Patient taking differently: Take 4 mg by mouth 2 (two) times daily as needed for muscle spasms. )  .  triamcinolone cream (KENALOG) 0.1 % Apply 1 application topically every morning.  . triamterene-hydrochlorothiazide (MAXZIDE) 75-50 MG tablet Take 1 tablet by mouth daily.  . [DISCONTINUED] nystatin cream (MYCOSTATIN) Apply 1 application topically 2 (two) times daily.    PHQ 2/9 Scores 10/19/2017 09/29/2017 08/31/2017 08/06/2017  PHQ - 2 Score 0 0 0 0    Physical Exam  Constitutional: No distress.  HENT:  Head: Normocephalic and atraumatic.  Right Ear: Tympanic membrane, external ear and ear canal normal.  Left Ear: Tympanic membrane, external ear and ear canal normal.  Nose: No mucosal edema. Right sinus exhibits maxillary sinus tenderness. Left sinus exhibits maxillary sinus tenderness.  Mouth/Throat: Uvula is midline. Oropharyngeal exudate, posterior oropharyngeal edema and posterior oropharyngeal erythema present.  Eyes: Pupils are equal, round, and reactive to light. Conjunctivae and EOM are normal. Right eye exhibits no discharge. Left eye exhibits no discharge.  Neck: Normal range of motion. Neck supple. No JVD present. No thyromegaly present.  Cardiovascular: Normal rate, regular rhythm, normal heart sounds and intact distal pulses. Exam reveals no gallop and no friction rub.  No murmur heard. Pulmonary/Chest: Effort normal and breath sounds normal.  Abdominal: Soft. Bowel sounds are normal. She exhibits no mass. There is no tenderness. There is no guarding.  Musculoskeletal: Normal range of motion. She exhibits no edema.  Lymphadenopathy:    She has no cervical adenopathy.  Neurological: She is alert. She has normal reflexes.  Skin: Skin is warm and dry. She is not diaphoretic. There is erythema.  Nursing note and vitals reviewed.   BP 120/80   Pulse 80   Ht 5\' 11"  (1.803 m)   Wt 249 lb (112.9 kg)   BMI 34.73 kg/m   Assessment and Plan:  1. Tinea inguinalis Recurrent Trial Diflucan 150 mg and nystatin with ammost resolution. Refill nystatin cream and apply bid.  2. Acute  non-recurrent maxillary sinusitis Acute Prescibed azithromycin 250 mg as directed for 5 days.  3. Yeast infection involving the vagina and surrounding area Refill nystatin cream as directed.  - nystatin cream (MYCOSTATIN); Apply 1 application topically 2 (two) times daily.  Dispense: 30 g; Refill: 0   Dr. Otilio Miu [facility]  11/16/2017

## 2017-11-30 ENCOUNTER — Other Ambulatory Visit: Payer: Self-pay | Admitting: Family Medicine

## 2017-11-30 DIAGNOSIS — B373 Candidiasis of vulva and vagina: Secondary | ICD-10-CM

## 2017-11-30 DIAGNOSIS — B3731 Acute candidiasis of vulva and vagina: Secondary | ICD-10-CM

## 2017-12-03 ENCOUNTER — Ambulatory Visit
Payer: Medicare Other | Attending: Student in an Organized Health Care Education/Training Program | Admitting: Student in an Organized Health Care Education/Training Program

## 2017-12-03 ENCOUNTER — Encounter: Payer: Self-pay | Admitting: Student in an Organized Health Care Education/Training Program

## 2017-12-03 ENCOUNTER — Other Ambulatory Visit: Payer: Self-pay

## 2017-12-03 ENCOUNTER — Encounter: Payer: Medicare Other | Admitting: Student in an Organized Health Care Education/Training Program

## 2017-12-03 VITALS — BP 133/72 | HR 88 | Temp 98.4°F | Resp 20 | Ht 71.0 in | Wt 246.0 lb

## 2017-12-03 DIAGNOSIS — G894 Chronic pain syndrome: Secondary | ICD-10-CM | POA: Diagnosis not present

## 2017-12-03 DIAGNOSIS — Z7982 Long term (current) use of aspirin: Secondary | ICD-10-CM | POA: Insufficient documentation

## 2017-12-03 DIAGNOSIS — M5116 Intervertebral disc disorders with radiculopathy, lumbar region: Secondary | ICD-10-CM | POA: Insufficient documentation

## 2017-12-03 DIAGNOSIS — Z79891 Long term (current) use of opiate analgesic: Secondary | ICD-10-CM | POA: Insufficient documentation

## 2017-12-03 DIAGNOSIS — E782 Mixed hyperlipidemia: Secondary | ICD-10-CM | POA: Diagnosis not present

## 2017-12-03 DIAGNOSIS — J45909 Unspecified asthma, uncomplicated: Secondary | ICD-10-CM | POA: Insufficient documentation

## 2017-12-03 DIAGNOSIS — D500 Iron deficiency anemia secondary to blood loss (chronic): Secondary | ICD-10-CM | POA: Insufficient documentation

## 2017-12-03 DIAGNOSIS — Z79899 Other long term (current) drug therapy: Secondary | ICD-10-CM | POA: Diagnosis not present

## 2017-12-03 DIAGNOSIS — M47812 Spondylosis without myelopathy or radiculopathy, cervical region: Secondary | ICD-10-CM | POA: Insufficient documentation

## 2017-12-03 DIAGNOSIS — Z880 Allergy status to penicillin: Secondary | ICD-10-CM | POA: Diagnosis not present

## 2017-12-03 DIAGNOSIS — Z6834 Body mass index (BMI) 34.0-34.9, adult: Secondary | ICD-10-CM | POA: Insufficient documentation

## 2017-12-03 DIAGNOSIS — M19011 Primary osteoarthritis, right shoulder: Secondary | ICD-10-CM | POA: Insufficient documentation

## 2017-12-03 DIAGNOSIS — M5416 Radiculopathy, lumbar region: Secondary | ICD-10-CM | POA: Diagnosis not present

## 2017-12-03 DIAGNOSIS — Z87891 Personal history of nicotine dependence: Secondary | ICD-10-CM | POA: Insufficient documentation

## 2017-12-03 DIAGNOSIS — Z79890 Hormone replacement therapy (postmenopausal): Secondary | ICD-10-CM | POA: Insufficient documentation

## 2017-12-03 DIAGNOSIS — K219 Gastro-esophageal reflux disease without esophagitis: Secondary | ICD-10-CM | POA: Insufficient documentation

## 2017-12-03 DIAGNOSIS — R001 Bradycardia, unspecified: Secondary | ICD-10-CM | POA: Diagnosis not present

## 2017-12-03 DIAGNOSIS — M5136 Other intervertebral disc degeneration, lumbar region: Secondary | ICD-10-CM | POA: Diagnosis not present

## 2017-12-03 DIAGNOSIS — N189 Chronic kidney disease, unspecified: Secondary | ICD-10-CM | POA: Diagnosis not present

## 2017-12-03 DIAGNOSIS — M62838 Other muscle spasm: Secondary | ICD-10-CM | POA: Insufficient documentation

## 2017-12-03 DIAGNOSIS — M48062 Spinal stenosis, lumbar region with neurogenic claudication: Secondary | ICD-10-CM | POA: Diagnosis not present

## 2017-12-03 DIAGNOSIS — I129 Hypertensive chronic kidney disease with stage 1 through stage 4 chronic kidney disease, or unspecified chronic kidney disease: Secondary | ICD-10-CM | POA: Insufficient documentation

## 2017-12-03 DIAGNOSIS — E039 Hypothyroidism, unspecified: Secondary | ICD-10-CM | POA: Insufficient documentation

## 2017-12-03 MED ORDER — ORPHENADRINE CITRATE 30 MG/ML IJ SOLN
30.0000 mg | Freq: Once | INTRAMUSCULAR | Status: AC
  Administered 2017-12-03: 60 mg via INTRAMUSCULAR
  Filled 2017-12-03: qty 2

## 2017-12-03 NOTE — Patient Instructions (Signed)
____________________________________________________________________________________________  Preparing for your procedure (without sedation)  Instructions: . Oral Intake: Do not eat or drink anything for at least 3 hours prior to your procedure. . Transportation: Unless otherwise stated by your physician, you may drive yourself after the procedure. . Blood Pressure Medicine: Take your blood pressure medicine with a sip of water the morning of the procedure. . Blood thinners: Notify our staff if you are taking any blood thinners. Depending on which one you take, there will be specific instructions on how and when to stop it. . Diabetics on insulin: Notify the staff so that you can be scheduled 1st case in the morning. If your diabetes requires high dose insulin, take only  of your normal insulin dose the morning of the procedure and notify the staff that you have done so. . Preventing infections: Shower with an antibacterial soap the morning of your procedure.  . Build-up your immune system: Take 1000 mg of Vitamin C with every meal (3 times a day) the day prior to your procedure. Marland Kitchen Antibiotics: Inform the staff if you have a condition or reason that requires you to take antibiotics before dental procedures. . Pregnancy: If you are pregnant, call and cancel the procedure. . Sickness: If you have a cold, fever, or any active infections, call and cancel the procedure. . Arrival: You must be in the facility at least 30 minutes prior to your scheduled procedure. . Children: Do not bring any children with you. . Dress appropriately: Bring dark clothing that you would not mind if they get stained. . Valuables: Do not bring any jewelry or valuables.  Procedure appointments are reserved for interventional treatments only. Marland Kitchen No Prescription Refills. . No medication changes will be discussed during procedure appointments. . No disability issues will be discussed.  Reasons to call and reschedule or  cancel your procedure: (Following these recommendations will minimize the risk of a serious complication.) . Surgeries: Avoid having procedures within 2 weeks of any surgery. (Avoid for 2 weeks before or after any surgery). . Flu Shots: Avoid having procedures within 2 weeks of a flu shots or . (Avoid for 2 weeks before or after immunizations). . Barium: Avoid having a procedure within 7-10 days after having had a radiological study involving the use of radiological contrast. (Myelograms, Barium swallow or enema study). . Heart attacks: Avoid any elective procedures or surgeries for the initial 6 months after a "Myocardial Infarction" (Heart Attack). . Blood thinners: It is imperative that you stop these medications before procedures. Let us know if you if you take any blood thinner.  . Infection: Avoid procedures during or within two weeks of an infection (including chest colds or gastrointestinal problems). Symptoms associated with infections include: Localized redness, fever, chills, night sweats or profuse sweating, burning sensation when voiding, cough, congestion, stuffiness, runny nose, sore throat, diarrhea, nausea, vomiting, cold or Flu symptoms, recent or current infections. It is specially important if the infection is over the area that we intend to treat. Marland Kitchen Heart and lung problems: Symptoms that may suggest an active cardiopulmonary problem include: cough, chest pain, breathing difficulties or shortness of breath, dizziness, ankle swelling, uncontrolled high or unusually low blood pressure, and/or palpitations. If you are experiencing any of these symptoms, cancel your procedure and contact your primary care physician for an evaluation.  Remember:  Regular Business hours are:  Monday to Thursday 8:00 AM to 4:00 PM  Provider's Schedule: Milinda Pointer, MD:  Procedure days: Tuesday and Thursday 7:30 AM to  4:00 PM  Gillis Santa, MD:  Procedure days: Monday and Wednesday 7:30 AM to 4:00  PM ____________________________________________________________________________________________

## 2017-12-03 NOTE — Progress Notes (Signed)
Safety precautions to be maintained throughout the outpatient stay will include: orient to surroundings, keep bed in low position, maintain call bell within reach at all times, provide assistance with transfer out of bed and ambulation.  

## 2017-12-03 NOTE — Progress Notes (Signed)
Patient's Name: Michele Moore  MRN: [id number]  Referring Provider: Juline Patch, MD  DOB: March 17, 1950  PCP: Juline Patch, MD  DOS: 12/03/2017  Note by: Gillis Santa, MD  Service setting: Ambulatory outpatient  Specialty: Interventional Pain Management  Location: [facility] (AMB) Pain Management Facility    Patient type: Established   Primary Reason(s) for Visit: Evaluation of chronic illnesses with exacerbation, or progression (Level of risk: moderate) CC: Back Pain (lower)  HPI  Michele Moore is a 67 y.o. year old, female patient, who comes today for a follow-up evaluation. She has Pain, lower extremity; Lumbar radiculopathy; Lumbar degenerative disc disease; Chronic pain syndrome; Spinal stenosis, lumbar region, with neurogenic claudication; Cervical facet syndrome; Osteoarthritis of right shoulder due to rotator cuff injury; Sprain of anterior talofibular ligament of right ankle; Severe obesity (BMI 35.0-35.9 with comorbidity) ([place]); Bradycardia; Hypertension; Hypothyroidism; Gastroesophageal reflux disease; Hypokalemia; Mixed hyperlipidemia; Reactive airway disease; Iron deficiency anemia due to chronic blood loss; Heme + stool; Acute gastritis without hemorrhage; Gastric polyps; Benign neoplasm of ascending colon; Benign neoplasm of transverse colon; and Polyp of sigmoid colon on their problem list. Michele Moore was last seen on 11/10/2017. Her primarily concern today is the Back Pain (lower)  Pain Assessment: Location: Lower Back Radiating: left hip and leg, with numbnss in foot Onset: More than a month ago Duration: Chronic pain Quality: Sharp, Numbness Severity: 8 /10 (subjective, self-reported pain score)  Note: Reported level is inconsistent with clinical observations. Clinically the patient looks like a 3/10 A 3/10 is viewed as "Moderate" and described as significantly interfering with activities of daily living (ADL). It becomes difficult to feed, bathe, get dressed, get on and off the toilet or  to perform personal hygiene functions. Difficult to get in and out of bed or a chair without assistance. Very distracting. With effort, it can be ignored when deeply involved in activities.       When using our objective Pain Scale, levels between 6 and 10/10 are said to belong in an emergency room, as it progressively worsens from a 6/10, described as severely limiting, requiring emergency care not usually available at an outpatient pain management facility. At a 6/10 level, communication becomes difficult and requires great effort. Assistance to reach the emergency department may be required. Facial flushing and profuse sweating along with potentially dangerous increases in heart rate and blood pressure will be evident. Effect on ADL:   Timing: Constant Modifying factors: medications BP: 133/72  HR: 88  Further details on both, my assessment(s), as well as the proposed treatment plan, please see below.  Patient follows up today status post left L4-L5 ESI #2 which she states was beneficial for approximately 4 to 5 weeks after the injection.  She states that over the last 2 weeks she has been having more pain going down her left leg to her left big toe.  Patient is also endorsing significant neck pain that radiates to bilateral shoulders and bilateral periscapular regions.  This is likely secondary to cervical facet arthropathy, cervical spondylosis.  Patient is status post left C4, C5, C6, C7 cervical facet medial branch nerve blocks on 12/01/2016 which provided her with significant pain relief for greater than 2 months.  Given that the patient is now endorsing worsening bilateral neck pain which is worse with cervical extension, we discussed repeating cervical facet medial branch nerve blocks bilaterally at C4, C5, C6, C7.  Laboratory Chemistry  Inflammation Markers (CRP: Acute Phase) (ESR: Chronic Phase) Lab Results  Component  Value Date   CRP <0.8 11/05/2017   ESRSEDRATE 52 (H) 11/05/2017                          Rheumatology Markers No results found for: RF, ANA, LABURIC, URICUR, LYMEIGGIGMAB, LYMEABIGMQN, HLAB27                      Renal Function Markers Lab Results  Component Value Date   BUN 36 (H) 11/05/2017   CREATININE 2.00 (H) 11/05/2017   BCR 15 10/01/2017   GFRAA 28 (L) 11/05/2017   GFRNONAA 24 (L) 11/05/2017                             Hepatic Function Markers Lab Results  Component Value Date   AST 23 11/05/2017   ALT 16 11/05/2017   ALBUMIN 4.2 11/05/2017   ALKPHOS 87 11/05/2017                        Electrolytes Lab Results  Component Value Date   NA 142 11/05/2017   K 5.2 (H) 11/05/2017   CL 109 11/05/2017   CALCIUM 9.9 11/05/2017   MG 2.2 12/27/2013   PHOS 3.0 10/01/2017                        Neuropathy Markers Lab Results  Component Value Date   HIV Non Reactive 08/25/2017                        CNS Tests No results found for: COLORCSF, APPEARCSF, RBCCOUNTCSF, WBCCSF, POLYSCSF, LYMPHSCSF, EOSCSF, PROTEINCSF, GLUCCSF, JCVIRUS, CSFOLI, IGGCSF                      Bone Pathology Markers No results found for: VD25OH, VD125OH2TOT, [id number], [id number], 25OHVITD1, 25OHVITD2, 25OHVITD3, TESTOFREE, TESTOSTERONE                       Coagulation Parameters Lab Results  Component Value Date   INR 0.94 11/05/2017   LABPROT 12.5 11/05/2017   APTT 34 11/05/2017   PLT 151 11/05/2017                        Cardiovascular Markers Lab Results  Component Value Date   CKTOTAL 154 12/27/2013   CKMB 0.8 12/27/2013   TROPONINI <0.03 09/07/2017   HGB 10.9 (L) 11/05/2017   HCT 32.9 (L) 11/05/2017                         CA Markers No results found for: CEA, CA125, LABCA2                      Note: Lab results reviewed.  Recent Diagnostic Imaging Review  Cervical Imaging:  Cervical CT wo contrast:  Results for orders placed during the hospital encounter of 05/13/17  CT Cervical Spine Wo Contrast   Narrative CLINICAL DATA:  Fall in  parking lot 1 hour ago. Trauma to back of head. Headache and shoulder pain.  EXAM: CT HEAD WITHOUT CONTRAST  CT CERVICAL SPINE WITHOUT CONTRAST  TECHNIQUE: Multidetector CT imaging of the head and cervical spine was performed following the standard protocol without intravenous contrast. Multiplanar CT image reconstructions of the  cervical spine were also generated.  COMPARISON:  CT head without contrast 12/12/2006. MRI of the cervical spine 06/24/2009  FINDINGS: CT HEAD FINDINGS  Brain: No acute infarct, hemorrhage, or mass lesion is present. The ventricles are of normal size. No significant extraaxial fluid collection is present. No significant extra-axial fluid collection is present.  Vascular: No hyperdense vessel or unexpected calcification.  Skull: Calvarium is intact. No focal lytic or blastic lesions are present. There is no acute fracture. No significant scalp injury is evident.  Sinuses/Orbits: The paranasal sinuses and mastoid air cells are clear. Globes and orbits are within normal limits.  CT CERVICAL SPINE FINDINGS  Alignment: Grade 1 anterolisthesis is present at C4-5 and at C7-T1 secondary to degenerative changes.  Skull base and vertebrae: The craniocervical junction is normal. No acute or healing fractures are present.  Soft tissues and spinal canal: The soft tissues the neck are otherwise unremarkable. No significant adenopathy is present. Thyroid is normal. Salivary glands are within normal limits.  Disc levels: Asymmetric left-sided facet hypertrophy contributes to foraminal narrowing at C2-3 and C3-4. Asymmetric uncovertebral spurring contributes to right foraminal narrowing at C5-6 and C6-7.  Upper chest: The lung apices are clear.  IMPRESSION: 1. No acute trauma to the head or cervical spine. 2. Normal CT appearance of the brain for age. 3. Degenerative changes of the cervical spine as described.   Electronically Signed   By:  San Morelle M.D.   On: 05/13/2017 15:52    Results for orders placed during the hospital encounter of 01/02/17  DG Cervical Spine 2-3 Views   Narrative CLINICAL DATA:  Chronic neck pain  EXAM: CERVICAL SPINE - 2-3 VIEW  COMPARISON:  None.  FINDINGS: Intact craniocervical relationship. Intact atlantodental interval. Maintained cervical lordosis with mild disc space narrowing C5-6. Mild multilevel degenerative facet arthropathy more pronounced at C7-T1. Mild degenerative anterolisthesis grade 1 of C7 on T1. No splaying of the lateral masses of C1 on C2.  IMPRESSION: 1. Mild disc space narrowing C5-6. 2. Facet arthropathy most pronounced at C7-T1 with resultant mild degenerative grade 1 anterolisthesis of C7 on T1. 3. No acute fracture noted.   Electronically Signed   By: Ashley Royalty M.D.   On: 01/02/2017 18:22     Shoulder-R MR wo contrast:  Results for orders placed during the hospital encounter of 03/20/17  MR SHOULDER RIGHT WO CONTRAST   Narrative CLINICAL DATA:  No known injury. Constant pain, decreased range of motion  EXAM: MRI OF THE RIGHT SHOULDER WITHOUT CONTRAST  TECHNIQUE: Multiplanar, multisequence MR imaging of the shoulder was performed. No intravenous contrast was administered.  COMPARISON:  None.  FINDINGS: Rotator cuff: Prior rotator cuff repair with postsurgical changes adjacent to the greater tuberosity with metallic anchors resulting in susceptibility artifact which partially obscures adjacent soft tissue and osseous structures. Postsurgical changes of the supraspinatus tendon with mild tendinosis of supraspinatus tendon with a small postsurgical fissure. Intact infraspinatus tendon. Teres minor tendon is intact. Subscapularis tendon is intact.  Muscles: No atrophy or fatty replacement of nor abnormal signal within, the muscles of the rotator cuff.  Biceps long head:  Intact.  Acromioclavicular Joint: Subacromial decompression  with distal clavicular resection. Postsurgical changes surrounding the decompression site. Type I acromion. Trace subacromial/subdeltoid bursal fluid.  Glenohumeral Joint: Small joint effusion. Partial-thickness cartilage loss of the glenohumeral joint.  Labrum: Grossly intact, but evaluation is limited by lack of intraarticular fluid.  Bones:  No marrow abnormality, fracture or dislocation.  Other: No fluid  collection or hematoma.  IMPRESSION: 1. Prior rotator cuff repair with postsurgical changes adjacent to the greater tuberosity with metallic anchors resulting in susceptibility artifact which partially obscures adjacent soft tissue and osseous structures. Postsurgical changes of the supraspinatus tendon with mild tendinosis of supraspinatus tendon with a small postsurgical fissure.   Electronically Signed   By: Kathreen Devoid   On: 03/20/2017 13:30     Shoulder-R DG:  Results for orders placed during the hospital encounter of 01/02/17  DG Shoulder Right   Narrative CLINICAL DATA:  Right arm and shoulder pain x2 weeks from pneumococcal vaccine.  EXAM: RIGHT SHOULDER - 2+ VIEW  COMPARISON:  None.  FINDINGS: No acute fracture nor joint dislocation. Mild degenerative subcortical cystic change of the superolateral humeral head with osteoarthritis of the adjacent AC joint. The adjacent ribs and lung are nonacute. No intra-articular loose bodies. Atherosclerosis of the right extracranial carotid artery. No soft tissue mass or mineralization.  IMPRESSION: Degenerative changes of the right shoulder without acute osseous abnormality.   Electronically Signed   By: Ashley Royalty M.D.   On: 01/02/2017 18:20     Lumbosacral Imaging: Lumbar MR wo contrast:  Results for orders placed during the hospital encounter of 08/26/16  MR LUMBAR SPINE WO CONTRAST   Narrative CLINICAL DATA:  Low back pain radiating to the left hip, increasing over the last 2 years.  EXAM: MRI  LUMBAR SPINE WITHOUT CONTRAST  TECHNIQUE: Multiplanar, multisequence MR imaging of the lumbar spine was performed. No intravenous contrast was administered.  COMPARISON:  11/16/2015  FINDINGS: Segmentation: The lowest lumbar type non-rib-bearing vertebra is labeled as L5.  Alignment: 3 mm degenerative anterolisthesis at T12-L1 and at L5-S1.  Vertebrae: Type 1 degenerative endplate findings at D5-3 and at L5-S1. Mild degenerative facet edema on the right at L3-4 and L4-5.  Conus medullaris: Extends to the upper L2 level and appears normal.  Paraspinal and other soft tissues: Unremarkable  Disc levels:  T11-12: No impingement. Diffuse disc bulge. This level is only included on the parasagittal images.  T12-L1: No impingement. Disc bulge and bilateral facet arthropathy.  L1-2:  No impingement.  Diffuse disc bulge.  L2-3:  No impingement.  Mild left eccentric disc bulge.  L3-4: Moderate central narrowing of the thecal sac with moderate right and mild left foraminal stenosis and mild displacement of both L3 nerves in the lateral extraforaminal space along with mild right subarticular lateral recess stenosis due to disc bulge, right greater than left facet arthropathy, and intervertebral spurring. Findings at this level are mildly worsened compared to the prior exam.  L4-5: Mild right and borderline left foraminal stenosis with mild bilateral subarticular lateral recess stenosis and mild central narrowing of the thecal sac due to disc bulge, intervertebral spurring, and facet arthropathy. There is also mild displacement of the right L4 nerve in the lateral extraforaminal space due to disc osteophyte complex. The right foraminal impingement is slightly worsened from prior.  L5-S1: Prominent left and moderate right foraminal stenosis along with mild left subarticular lateral recess stenosis due to disc uncovering, disc bulge, left foraminal disc protrusion, and  facet arthropathy, worsened compared to prior.  IMPRESSION: 1. Progressive lumbar spondylosis and degenerative disc disease, causing prominent impingement at L5-S1; moderate impingement at L3-4 ; and mild impingement at L4-5, as detailed above.   Electronically Signed   By: Van Clines M.D.   On: 08/26/2016 10:17     Results for orders placed during the hospital encounter of 11/16/15  DG Lumbar Spine Complete   Narrative CLINICAL DATA:  Fall.  EXAM: LUMBAR SPINE - COMPLETE 4+ VIEW  COMPARISON:  02/21/2015 .  FINDINGS: Diffuse degenerative changes lumbar spine. Normal alignment. No acute fracture. Minimal compression of L5 is stable .  IMPRESSION: Diffuse degenerative change. Minimal compression of L5. This is stable. No acute abnormality identified.   Electronically Signed   By: Marcello Moores  Register   On: 11/16/2015 17:08     Results for orders placed during the hospital encounter of 11/16/15  DG Hip Unilat W or Wo Pelvis 2-3 Views Left   Narrative CLINICAL DATA:  [place] today.  Bilateral lower extremity pain.  EXAM: DG HIP (WITH OR WITHOUT PELVIS) 2-3V LEFT  COMPARISON:  Radiographs 02/21/2015  FINDINGS: Both hips are normally located. Mild stable joint space narrowing and early spurring. No fracture or AVN. The pubic symphysis and SI joints are intact. No definite pelvic fractures or bone lesions.  IMPRESSION: No acute bony findings.   Electronically Signed   By: Marijo Sanes M.D.   On: 11/16/2015 17:08    Hip-R DG Arthrogram: No results found for this or any previous visit. Hip-L DG Arthrogram: No results found for this or any previous visit. Hip-B DG Bilateral: No results found for this or any previous visit.   Results for orders placed during the hospital encounter of 11/16/15  CT Knee Right Wo Contrast   Narrative CLINICAL DATA:  Status post slip and fall today on a wheelchair ramp with a right knee injury and pain. Initial  encounter.  EXAM: CT OF THE RIGHT KNEE WITHOUT CONTRAST  TECHNIQUE: Multidetector CT imaging of the right knee was performed according to the standard protocol. Multiplanar CT image reconstructions were also generated.  COMPARISON:  Plain films right knee earlier today.  FINDINGS: Bones/Joint/Cartilage  Right total knee arthroplasty is in place. The patient has an acute nondisplaced periprosthetic fracture along the peripheral margin of the medial femoral condyle. The fracture extends from the medial metaphysis 4 cm above the joint line to approximately the level of the joint line. No other fracture is identified. The patient's arthroplasty is located without loosening or other complicating feature.  Ligaments  Not applicable.  Muscles and Tendons  Unremarkable.  Soft tissues  Large joint effusion is noted. 2 small linear radiopaque densities measuring approximately 1 cm in diameter is seen along the anterior margin of the distal femur on the lateral side and are likely calcifications rather than loose bony fragments.  IMPRESSION: Nondisplaced periprosthetic fracture of the right medial femoral condyle with associated large joint effusion.   Electronically Signed   By: Inge Rise M.D.   On: 11/16/2015 18:24     Results for orders placed during the hospital encounter of 11/16/15  DG Knee Complete 4 Views Right   Narrative CLINICAL DATA:  [place] today.  Right knee pain.  EXAM: RIGHT KNEE - COMPLETE 4+ VIEW  COMPARISON:  None.  FINDINGS: The femoral and tibial components of a total knee arthroplasty appear well seated without complicating features. Could not exclude subtle nondisplaced fracture involving the medial femoral condyle. There is a large joint effusion and possible lipohemarthrosis.  IMPRESSION: Large joint effusion and possible lipohemarthrosis. Could not exclude a subtle fracture involving the medial femoral condyle. CT may be helpful  for further evaluation.   Electronically Signed   By: Marijo Sanes M.D.   On: 11/16/2015 17:15    Knee-L DG 4 views:  Results for orders placed during the hospital encounter  of 11/16/15  DG Knee Complete 4 Views Left   Narrative CLINICAL DATA:  [place] today.  EXAM: LEFT KNEE - COMPLETE 4+ VIEW  COMPARISON:  None.  FINDINGS: Subtle left knee arthroplasty components are well seated. No complicating features. No periprosthetic fracture. Areas of heterotopic ossification are noted. No definite joint effusion.  IMPRESSION: No acute bony findings.   Electronically Signed   By: Marijo Sanes M.D.   On: 11/16/2015 17:10     Ankle Imaging: Ankle-R DG Complete:  Results for orders placed during the hospital encounter of 05/13/17  DG Ankle Complete Right   Narrative CLINICAL DATA:  Fall twisted the right ankle  EXAM: RIGHT ANKLE - COMPLETE 3+ VIEW  COMPARISON:  11/16/2015  FINDINGS: Talocalcaneal fixating screw with effacement of subtalar joints. Surgical hardware within the tarsal bones with fracture through an anterior fixating screw. Interim placement of fixating screw that parallels the medial malleolus. Asymmetric narrowing of the superior medial mortise and medial joint space unchanged compared to prior. No definite acute displaced fracture.  IMPRESSION: 1. No definite acute osseous abnormality. 2. Surgical changes of the tarsal bones with subtalar fusion. Fracture through anterior fixating screw on the lateral view, new since prior. Placement of new fixating screw within the tarsal bones that protrudes and parallels the medial malleolus.   Electronically Signed   By: Donavan Foil M.D.   On: 05/13/2017 15:47    Ankle-L DG Complete:  Results for orders placed during the hospital encounter of 11/16/15  DG Ankle Complete Left   Narrative CLINICAL DATA:  [place] today.  Left ankle pain.  EXAM: LEFT ANKLE COMPLETE - 3+ VIEW  COMPARISON:   None.  FINDINGS: The ankle mortise is maintained. No acute ankle fracture. No osteochondral lesion. No definite joint effusion. Mild pes planus. The mid and hindfoot bony structures are grossly intact. Small calcaneal heel spur.  IMPRESSION: No acute fracture.   Electronically Signed   By: Marijo Sanes M.D.   On: 11/16/2015 17:11      Complexity Note: Imaging results reviewed. Results shared with Michele Moore, using Layman's terms.                         Meds   Current Outpatient Medications:  .  albuterol (PROVENTIL HFA;VENTOLIN HFA) 108 (90 Base) MCG/ACT inhaler, Inhale 2 puffs into the lungs every 4 (four) hours as needed for wheezing., Disp: 1 Inhaler, Rfl: 5 .  allopurinol (ZYLOPRIM) 100 MG tablet, Take 2 tablets by mouth daily. Dr Barb Merino, Disp: , Rfl:  .  aspirin EC 81 MG tablet, Take 81 mg by mouth daily., Disp: , Rfl:  .  Cholecalciferol (VITAMIN D3) 2000 units TABS, Take 2,000 Units by mouth daily., Disp: , Rfl:  .  diclofenac sodium (VOLTAREN) 1 % GEL, Apply 2 g topically 3 (three) times daily. [facility], Disp: , Rfl:  .  ferrous sulfate 325 (65 FE) MG tablet, Take 325 mg by mouth daily with breakfast. otc, Disp: , Rfl:  .  fluticasone (FLONASE) 50 MCG/ACT nasal spray, Place 1 spray into both nostrils daily., Disp: 16 g, Rfl: 2 .  gabapentin (NEURONTIN) 300 MG capsule, TAKE 2 CAPSULES BY MOUTH NIGHTLY/ Chrystine Frogge (Patient taking differently: Take 600 mg by mouth at bedtime. ), Disp: 60 capsule, Rfl: 3 .  hydrALAZINE (APRESOLINE) 50 MG tablet, Take 1 tablet (50 mg total) by mouth 2 (two) times daily., Disp: 60 tablet, Rfl: 1 .  HYDROcodone-acetaminophen (NORCO) 7.5-325  MG tablet, Take 1 tablet by mouth 3 (three) times daily as needed for severe pain. For Chronic Pain DNF: 09/30/17, 10/30/17, 11/28/17, Disp: 90 tablet, Rfl: 0 .  ketoconazole (NIZORAL) 2 % cream, U UTD QHS (Patient taking differently: Apply 1 application topically at bedtime. U UTD QHS), Disp: 30 g, Rfl:  0 .  levothyroxine (SYNTHROID) 200 MCG tablet, Take 1 tablet (200 mcg total) by mouth daily before breakfast., Disp: 30 tablet, Rfl: 5 .  meclizine (ANTIVERT) 12.5 MG tablet, Take 1 tablet (12.5 mg total) by mouth 2 (two) times daily as needed for dizziness., Disp: 30 tablet, Rfl: 0 .  Multiple Vitamins-Calcium (ONE-A-DAY WOMENS FORMULA PO), Take 1 tablet by mouth daily., Disp: , Rfl:  .  nystatin cream (MYCOSTATIN), APPLY EXTERNALLY TO THE AFFECTED AREA TWICE DAILY, Disp: 30 g, Rfl: 0 .  omega-3 acid ethyl esters (LOVAZA) 1 g capsule, TAKE 2 CAPSULES BY MOUTH TWICE DAILY, Disp: 120 capsule, Rfl: 0 .  omeprazole (PRILOSEC) 40 MG capsule, Take 1 capsule (40 mg total) by mouth daily., Disp: 30 capsule, Rfl: 5 .  Polyethyl Glycol-Propyl Glycol (SYSTANE OP), Place 1 drop into both eyes daily as needed (dry eyes). , Disp: , Rfl:  .  polyethylene glycol (MIRALAX / GLYCOLAX) packet, Take 17 g by mouth daily. (Patient taking differently: Take 17 g by mouth daily as needed for mild constipation. ), Disp: 14 each, Rfl: 0 .  potassium chloride SA (K-DUR,KLOR-CON) 20 MEQ tablet, Take 0.5 tablets (10 mEq total) by mouth daily., Disp: 30 tablet, Rfl: 5 .  Propylene Glycol (SYSTANE BALANCE OP), Apply 1 drop to eye 2 (two) times daily., Disp: , Rfl:  .  ranitidine (ZANTAC) 150 MG tablet, Take 1 tablet (150 mg total) by mouth 2 (two) times daily., Disp: 60 tablet, Rfl: 5 .  tiZANidine (ZANAFLEX) 4 MG capsule, Take 1 capsule (4 mg total) by mouth 2 (two) times daily as needed. (Patient taking differently: Take 4 mg by mouth 2 (two) times daily as needed for muscle spasms. ), Disp: 60 capsule, Rfl: 2 .  triamcinolone cream (KENALOG) 0.1 %, Apply 1 application topically every morning., Disp: , Rfl:  .  triamterene-hydrochlorothiazide (MAXZIDE) 75-50 MG tablet, Take 1 tablet by mouth daily., Disp: 30 tablet, Rfl: 5  ROS  Constitutional: Denies any fever or chills Gastrointestinal: No reported hemesis, hematochezia,  vomiting, or acute GI distress Musculoskeletal: Denies any acute onset joint swelling, redness, loss of ROM, or weakness Neurological: No reported episodes of acute onset apraxia, aphasia, dysarthria, agnosia, amnesia, paralysis, loss of coordination, or loss of consciousness  Allergies  Michele Moore is allergic to penicillins and vibramycin [doxycycline calcium].  PFSH  Drug: Michele Moore  reports that she does not use drugs. Alcohol:  reports that she does not drink alcohol. Tobacco:  reports that she quit smoking about 24 years ago. She has never used smokeless tobacco. Medical:  has a past medical history of Anemia, Arthritis, Asthma, Chronic kidney disease, Collagen vascular disease ([place]), Family history of adverse reaction to anesthesia, GERD (gastroesophageal reflux disease), H/O tooth extraction, Hemorrhoid, History of hiatal hernia, Hypertension, Hypothyroidism, Migraines, Mixed hyperlipidemia, Multiple gastric ulcers, Thyroid disease, and Wears dentures. Surgical: Michele Moore  has a past surgical history that includes Carpal tunnel release; Rotator cuff repair; Ankle surgery; Tubal ligation; Colonoscopy (2000?); Upper gi endoscopy (2000?); Tonsillectomy; Fusion of talonavicular joint (Right, 08/03/2015); Colonoscopy with propofol (N/A, 10/22/2017); Esophagogastroduodenoscopy (egd) with propofol (N/A, 10/22/2017); polypectomy (N/A, 10/22/2017); and Joint replacement. Family: family history includes  COPD in her sister; Cancer in her maternal aunt and maternal uncle.  Constitutional Exam  General appearance: Well nourished, well developed, and well hydrated. In no apparent acute distress Vitals:   12/03/17 1202  BP: 133/72  Pulse: 88  Resp: 20  Temp: 98.4 F (36.9 C)  TempSrc: Oral  SpO2: 100%  Weight: 246 lb (111.6 kg)  Height: '5\' 11"'$  (1.803 m)   BMI Assessment: Estimated body mass index is 34.31 kg/m as calculated from the following:   Height as of this encounter: '5\' 11"'$  (1.803 m).   Weight  as of this encounter: 246 lb (111.6 kg).  BMI interpretation table: BMI level Category Range association with higher incidence of chronic pain  <18 kg/m2 Underweight   18.5-24.9 kg/m2 Ideal body weight   25-29.9 kg/m2 Overweight Increased incidence by 20%  30-34.9 kg/m2 Obese (Class I) Increased incidence by 68%  35-39.9 kg/m2 Severe obesity (Class II) Increased incidence by 136%  >40 kg/m2 Extreme obesity (Class III) Increased incidence by 254%   Patient's current BMI Ideal Body weight  Body mass index is 34.31 kg/m. Ideal body weight: 70.8 kg (156 lb 1.4 oz) Adjusted ideal body weight: 87.1 kg (192 lb 0.8 oz)   BMI Readings from Last 4 Encounters:  12/03/17 34.31 kg/m  11/16/17 34.73 kg/m  11/05/17 34.17 kg/m  10/22/17 34.17 kg/m   Wt Readings from Last 4 Encounters:  12/03/17 246 lb (111.6 kg)  11/16/17 249 lb (112.9 kg)  10/22/17 245 lb (111.1 kg)  10/19/17 238 lb (108 kg)  Psych/Mental status: Alert, oriented x 3 (person, place, & time)       Eyes: PERLA Respiratory: No evidence of acute respiratory distress  Cervical Spine Area Exam  Skin & Axial Inspection: No masses, redness, edema, swelling, or associated skin lesions Alignment: Symmetrical Functional ROM: Diminished ROM      Stability: No instability detected Muscle Tone/Strength: Functionally intact. No obvious neuro-muscular anomalies detected. Sensory (Neurological): Articular pain pattern and musculoskeletal Palpation: Complains of area being tender to palpation              Upper Extremity (UE) Exam    Side: Right upper extremity  Side: Left upper extremity  Skin & Extremity Inspection: Edema of right wrist  Skin & Extremity Inspection: Skin color, temperature, and hair growth are WNL. No peripheral edema or cyanosis. No masses, redness, swelling, asymmetry, or associated skin lesions. No contractures.  Functional ROM: Decreased ROM for shoulder and elbow  Functional ROM: Unrestricted ROM          Muscle  Tone/Strength: Functionally intact. No obvious neuro-muscular anomalies detected.  Muscle Tone/Strength: Functionally intact. No obvious neuro-muscular anomalies detected.  Sensory (Neurological): Musculoskeletal pain pattern          Sensory (Neurological): Unimpaired          Palpation: No palpable anomalies              Palpation: No palpable anomalies              Provocative Test(s):  Phalen's test: deferred Tinel's test: deferred Apley's scratch test (touch opposite shoulder):  Action 1 (Across chest): Decreased ROM Action 2 (Overhead): Decreased ROM Action 3 (LB reach): Decreased ROM   Provocative Test(s):  Phalen's test: deferred Tinel's test: deferred Apley's scratch test (touch opposite shoulder):  Action 1 (Across chest): Decreased ROM Action 2 (Overhead): Decreased ROM Action 3 (LB reach): Decreased ROM    Thoracic Spine Area Exam  Skin & Axial Inspection: No  masses, redness, or swelling Alignment: Symmetrical Functional ROM: Unrestricted ROM Stability: No instability detected Muscle Tone/Strength: Functionally intact. No obvious neuro-muscular anomalies detected. Sensory (Neurological): Unimpaired Muscle strength & Tone: No palpable anomalies  Lumbar Spine Area Exam  Skin & Axial Inspection: No masses, redness, or swelling Alignment: Symmetrical Functional ROM: Improved after treatment       Stability: No instability detected Muscle Tone/Strength: Functionally intact. No obvious neuro-muscular anomalies detected. Sensory (Neurological): Dermatomal pain pattern Palpation: No palpable anomalies       Provocative Tests: Hyperextension/rotation test: deferred today       Lumbar quadrant test (Kemp's test): deferred today       Lateral bending test: Improved after treatment       Patrick's Maneuver: deferred today                   FABER test: deferred today                   S-I anterior distraction/compression test: deferred today         S-I lateral compression  test: deferred today         S-I Thigh-thrust test: deferred today         S-I Gaenslen's test: deferred today          Gait & Posture Assessment  Ambulation: Limited Gait: Antalgic Posture: Difficulty standing up straight, due to pain   Lower Extremity Exam    Side: Right lower extremity  Side: Left lower extremity  Stability: No instability observed          Stability: No instability observed          Skin & Extremity Inspection: Skin color, temperature, and hair growth are WNL. No peripheral edema or cyanosis. No masses, redness, swelling, asymmetry, or associated skin lesions. No contractures.  Skin & Extremity Inspection: Skin color, temperature, and hair growth are WNL. No peripheral edema or cyanosis. No masses, redness, swelling, asymmetry, or associated skin lesions. No contractures.  Functional ROM: Unrestricted ROM                  Functional ROM: Decreased ROM for hip and knee joints          Muscle Tone/Strength: Functionally intact. No obvious neuro-muscular anomalies detected.  Muscle Tone/Strength: Functionally intact. No obvious neuro-muscular anomalies detected.  Sensory (Neurological): Unimpaired  Sensory (Neurological): Dermatomal pain pattern and arthropathic  Palpation: No palpable anomalies  Palpation: No palpable anomalies   Assessment  Primary Diagnosis & Pertinent Problem List: The primary encounter diagnosis was Cervical spondylosis without myelopathy. Diagnoses of Cervical facet syndrome, Chronic pain syndrome, Lumbar radiculopathy, and Lumbar degenerative disc disease were also pertinent to this visit.  Status Diagnosis  Having a Flare-up Having a Flare-up Persistent 1. Cervical spondylosis without myelopathy   2. Cervical facet syndrome   3. Chronic pain syndrome   4. Lumbar radiculopathy   5. Lumbar degenerative disc disease      General Recommendations: The pain condition that the patient suffers from is best treated with a multidisciplinary approach  that involves an increase in physical activity to prevent de-conditioning and worsening of the pain cycle, as well as psychological counseling (formal and/or informal) to address the co-morbid psychological affects of pain. Treatment will often involve judicious use of pain medications and interventional procedures to decrease the pain, allowing the patient to participate in the physical activity that will ultimately produce long-lasting pain reductions. The goal of the multidisciplinary approach is to  return the patient to a higher level of overall function and to restore their ability to perform activities of daily living.   Patient follows up today status post left L4-L5 ESI #2 which she states was beneficial for approximately 4 to 5 weeks after the injection.  She states that over the last 2 weeks she has been having more pain going down her left leg to her left big toe.  Patient is also endorsing significant neck pain that radiates to bilateral shoulders and bilateral periscapular regions.  This is likely secondary to cervical facet arthropathy, cervical spondylosis.  Patient is status post left C4, C5, C6, C7 cervical facet medial branch nerve blocks on 12/01/2016 which provided her with significant pain relief for greater than 2 months.  Given that the patient is now endorsing worsening bilateral neck pain which is worse with cervical extension, we discussed repeating cervical facet medial branch nerve blocks bilaterally at C4, C5, C6, C7.  Michele Moore has a history of greater than 3 months of moderate to severe pain which is resulted in functional impairment.  The patient has tried various conservative therapeutic options such as NSAIDs, Tylenol, muscle relaxants, physical therapy which was inadequately effective.  Patient's pain is predominantly axial with physical exam findings suggestive of facet arthropathy.  Cervical facet medial branch nerve blocks were discussed with the patient.  Risks and  benefits were reviewed.  Patient would like to proceed with bilateral C4, C5, C6, C7 medial branch nerve block.  Given the patient's complaint of cervical paraspinal muscle spasms and tightness, we discussed intramuscular Norflex today and patient would like to proceed.  Plan: -Intramuscular Norflex 30 mg -Bilateral cervical facet medial branch nerve blocks at C4, C5, C6, C7 under fluoroscopy with sedation for cervical spondylosis and cervical facet arthropathy.   Plan of Care  Pharmacotherapy (Medications Ordered): Meds ordered this encounter  Medications  . orphenadrine (NORFLEX) injection 30 mg   Lab-work, procedure(s), and/or referral(s): Orders Placed This Encounter  Procedures  . CERVICAL FACET (MEDIAL BRANCH NERVE BLOCK)    Time Note: Greater than 50% of the 25 minute(s) of face-to-face time spent with Michele Moore, was spent in counseling/coordination of care regarding: Michele Moore's primary cause of pain, the treatment plan, treatment alternatives, the risks and possible complications of proposed treatment, going over the informed consent, realistic expectations and the goals of pain management (increased in functionality).  Provider-requested follow-up: Return in about 3 weeks (around 12/24/2017) for Procedure.  Future Appointments  Date Time Provider [place]  12/16/2017  8:45 AM Gillis Santa, MD [facility] None  12/29/2017  1:30 PM Gillis Santa, MD [facility] None  03/01/2018 10:00 AM Merkel ADVISOR [facility] None    Primary Care Physician: Juline Patch, MD Location: [facility] Note by: Gillis Santa, M.D Date: 12/03/2017; Time: 3:19 PM  Patient Instructions  ____________________________________________________________________________________________  Preparing for your procedure (without sedation)  Instructions: . Oral Intake: Do not eat or drink anything for at least 3 hours prior to your procedure. . Transportation:  Unless otherwise stated by your physician, you may drive yourself after the procedure. . Blood Pressure Medicine: Take your blood pressure medicine with a sip of water the morning of the procedure. . Blood thinners: Notify our staff if you are taking any blood thinners. Depending on which one you take, there will be specific instructions on how and when to stop it. . Diabetics on insulin: Notify the staff so that you can be scheduled 1st  case in the morning. If your diabetes requires high dose insulin, take only  of your normal insulin dose the morning of the procedure and notify the staff that you have done so. . Preventing infections: Shower with an antibacterial soap the morning of your procedure.  . Build-up your immune system: Take 1000 mg of Vitamin C with every meal (3 times a day) the day prior to your procedure. Marland Kitchen Antibiotics: Inform the staff if you have a condition or reason that requires you to take antibiotics before dental procedures. . Pregnancy: If you are pregnant, call and cancel the procedure. . Sickness: If you have a cold, fever, or any active infections, call and cancel the procedure. . Arrival: You must be in the facility at least 30 minutes prior to your scheduled procedure. . Children: Do not bring any children with you. . Dress appropriately: Bring dark clothing that you would not mind if they get stained. . Valuables: Do not bring any jewelry or valuables.  Procedure appointments are reserved for interventional treatments only. Marland Kitchen No Prescription Refills. . No medication changes will be discussed during procedure appointments. . No disability issues will be discussed.  Reasons to call and reschedule or cancel your procedure: (Following these recommendations will minimize the risk of a serious complication.) . Surgeries: Avoid having procedures within 2 weeks of any surgery. (Avoid for 2 weeks before or after any surgery). . Flu Shots: Avoid having procedures within 2  weeks of a flu shots or . (Avoid for 2 weeks before or after immunizations). . Barium: Avoid having a procedure within 7-10 days after having had a radiological study involving the use of radiological contrast. (Myelograms, Barium swallow or enema study). . Heart attacks: Avoid any elective procedures or surgeries for the initial 6 months after a "Myocardial Infarction" (Heart Attack). . Blood thinners: It is imperative that you stop these medications before procedures. Let us know if you if you take any blood thinner.  . Infection: Avoid procedures during or within two weeks of an infection (including chest colds or gastrointestinal problems). Symptoms associated with infections include: Localized redness, fever, chills, night sweats or profuse sweating, burning sensation when voiding, cough, congestion, stuffiness, runny nose, sore throat, diarrhea, nausea, vomiting, cold or Flu symptoms, recent or current infections. It is specially important if the infection is over the area that we intend to treat. Marland Kitchen Heart and lung problems: Symptoms that may suggest an active cardiopulmonary problem include: cough, chest pain, breathing difficulties or shortness of breath, dizziness, ankle swelling, uncontrolled high or unusually low blood pressure, and/or palpitations. If you are experiencing any of these symptoms, cancel your procedure and contact your primary care physician for an evaluation.  Remember:  Regular Business hours are:  Monday to Thursday 8:00 AM to 4:00 PM  Provider's Schedule: Milinda Pointer, MD:  Procedure days: Tuesday and Thursday 7:30 AM to 4:00 PM  Gillis Santa, MD:  Procedure days: Monday and Wednesday 7:30 AM to 4:00 PM ____________________________________________________________________________________________

## 2017-12-11 DIAGNOSIS — M25562 Pain in left knee: Secondary | ICD-10-CM | POA: Diagnosis not present

## 2017-12-11 DIAGNOSIS — G8929 Other chronic pain: Secondary | ICD-10-CM | POA: Diagnosis not present

## 2017-12-11 DIAGNOSIS — E669 Obesity, unspecified: Secondary | ICD-10-CM | POA: Diagnosis not present

## 2017-12-11 DIAGNOSIS — Z96653 Presence of artificial knee joint, bilateral: Secondary | ICD-10-CM | POA: Diagnosis not present

## 2017-12-11 DIAGNOSIS — M1A00X0 Idiopathic chronic gout, unspecified site, without tophus (tophi): Secondary | ICD-10-CM | POA: Diagnosis not present

## 2017-12-11 DIAGNOSIS — T84023D Instability of internal left knee prosthesis, subsequent encounter: Secondary | ICD-10-CM | POA: Diagnosis not present

## 2017-12-13 ENCOUNTER — Other Ambulatory Visit: Payer: Self-pay | Admitting: Family Medicine

## 2017-12-13 DIAGNOSIS — Z6835 Body mass index (BMI) 35.0-35.9, adult: Principal | ICD-10-CM

## 2017-12-13 DIAGNOSIS — E785 Hyperlipidemia, unspecified: Secondary | ICD-10-CM

## 2017-12-16 ENCOUNTER — Encounter: Payer: Self-pay | Admitting: Student in an Organized Health Care Education/Training Program

## 2017-12-16 ENCOUNTER — Other Ambulatory Visit: Payer: Self-pay

## 2017-12-16 ENCOUNTER — Ambulatory Visit
Admission: RE | Admit: 2017-12-16 | Discharge: 2017-12-16 | Disposition: A | Discharge status: Home / Self Care | Payer: Medicare Other | Source: Ambulatory Visit | Attending: Student in an Organized Health Care Education/Training Program | Admitting: Student in an Organized Health Care Education/Training Program

## 2017-12-16 ENCOUNTER — Ambulatory Visit (HOSPITAL_BASED_OUTPATIENT_CLINIC_OR_DEPARTMENT_OTHER): Payer: Medicare Other | Admitting: Student in an Organized Health Care Education/Training Program

## 2017-12-16 VITALS — BP 128/71 | HR 75 | Temp 97.5°F | Resp 13 | Ht 71.0 in | Wt 246.0 lb

## 2017-12-16 DIAGNOSIS — M47812 Spondylosis without myelopathy or radiculopathy, cervical region: Secondary | ICD-10-CM | POA: Diagnosis not present

## 2017-12-16 DIAGNOSIS — M542 Cervicalgia: Secondary | ICD-10-CM | POA: Diagnosis present

## 2017-12-16 DIAGNOSIS — M25511 Pain in right shoulder: Secondary | ICD-10-CM | POA: Diagnosis present

## 2017-12-16 DIAGNOSIS — M25512 Pain in left shoulder: Secondary | ICD-10-CM | POA: Diagnosis present

## 2017-12-16 MED ORDER — DEXAMETHASONE SODIUM PHOSPHATE 10 MG/ML IJ SOLN
10.0000 mg | Freq: Once | INTRAMUSCULAR | Status: AC
  Administered 2017-12-16: 10 mg

## 2017-12-16 MED ORDER — LACTATED RINGERS IV SOLN
1000.0000 mL | Freq: Once | INTRAVENOUS | Status: AC
  Administered 2017-12-16: 1000 mL via INTRAVENOUS

## 2017-12-16 MED ORDER — ROPIVACAINE HCL 2 MG/ML IJ SOLN
INTRAMUSCULAR | Status: AC
  Filled 2017-12-16: qty 10

## 2017-12-16 MED ORDER — LIDOCAINE HCL 2 % IJ SOLN
INTRAMUSCULAR | Status: AC
  Filled 2017-12-16: qty 20

## 2017-12-16 MED ORDER — FENTANYL CITRATE (PF) 100 MCG/2ML IJ SOLN
25.0000 ug | INTRAMUSCULAR | Status: DC | PRN
  Administered 2017-12-16: 75 ug via INTRAVENOUS

## 2017-12-16 MED ORDER — LIDOCAINE HCL 2 % IJ SOLN
20.0000 mL | Freq: Once | INTRAMUSCULAR | Status: AC
  Administered 2017-12-16: 400 mg

## 2017-12-16 MED ORDER — DEXAMETHASONE SODIUM PHOSPHATE 10 MG/ML IJ SOLN
INTRAMUSCULAR | Status: AC
  Filled 2017-12-16: qty 2

## 2017-12-16 MED ORDER — ROPIVACAINE HCL 2 MG/ML IJ SOLN
10.0000 mL | Freq: Once | INTRAMUSCULAR | Status: AC
  Administered 2017-12-16: 10 mL

## 2017-12-16 MED ORDER — FENTANYL CITRATE (PF) 100 MCG/2ML IJ SOLN
INTRAMUSCULAR | Status: AC
  Filled 2017-12-16: qty 2

## 2017-12-16 NOTE — Progress Notes (Signed)
Patient's Name: Michele Moore  MRN: [id number]  Referring Provider: Juline Patch, MD  DOB: 12-10-1950  PCP: Juline Patch, MD  DOS: 12/16/2017  Note by: Gillis Santa, MD  Service setting: Ambulatory outpatient  Specialty: Interventional Pain Management  Patient type: Established  Location: [facility] (AMB) Pain Management Facility  Visit type: Interventional Procedure   Primary Reason for Visit: Interventional Pain Management Treatment. CC: Neck Pain; Shoulder Pain (bilaterally); and Knee Pain (bilaterally)  Procedure:          Anesthesia, Analgesia, Anxiolysis:  Type: Cervical Facet Medial Branch Block(s)           Primary Purpose: Diagnostic Region: Posterolateral cervical spine Level: C4, C5, C6, & C7 Medial Branch Level(s). Injecting these levels blocks the  C4-5, C5-6, and C6-7 cervical facet joints. Laterality: Bilateral  Type: Moderate (Conscious) Sedation combined with Local Anesthesia Indication(s): Analgesia and Anxiety Route: Intravenous (IV) IV Access: Secured Sedation: Meaningful verbal contact was maintained at all times during the procedure  Local Anesthetic: Lidocaine 1-2%  Position: Prone with head of the table raised to facilitate breathing.   Indications: 1. Cervical spondylosis without myelopathy   2. Cervical facet syndrome    Pain Score: Pre-procedure: 8 /10 Post-procedure: 0-No pain/10  Pre-op Assessment:  Michele Moore is a 67 y.o. (year old), female patient, seen today for interventional treatment. She  has a past surgical history that includes Carpal tunnel release; Rotator cuff repair; Ankle surgery; Tubal ligation; Colonoscopy (2000?); Upper gi endoscopy (2000?); Tonsillectomy; Fusion of talonavicular joint (Right, 08/03/2015); Colonoscopy with propofol (N/A, 10/22/2017); Esophagogastroduodenoscopy (egd) with propofol (N/A, 10/22/2017); polypectomy (N/A, 10/22/2017); and Joint replacement. Michele Moore has a current medication list which includes the following  prescription(s): albuterol, allopurinol, aspirin ec, vitamin d3, diclofenac sodium, ferrous sulfate, fluticasone, gabapentin, hydralazine, hydrocodone-acetaminophen, ketoconazole, levothyroxine, meclizine, multiple vitamins-calcium, nystatin cream, omega-3 acid ethyl esters, omeprazole, polyethyl glycol-propyl glycol, polyethylene glycol, potassium chloride sa, propylene glycol, ranitidine, tizanidine, triamcinolone cream, and triamterene-hydrochlorothiazide, and the following Facility-Administered Medications: fentanyl. Her primarily concern today is the Neck Pain; Shoulder Pain (bilaterally); and Knee Pain (bilaterally)  Initial Vital Signs:  Pulse/HCG Rate: 64ECG Heart Rate: 63 Temp: 98.5 F (36.9 C) Resp: 16 BP: 131/79 SpO2: 100 %  BMI: Estimated body mass index is 34.31 kg/m as calculated from the following:   Height as of this encounter: 5\' 11"  (1.803 m).   Weight as of this encounter: 246 lb (111.6 kg).  Risk Assessment: Allergies: Reviewed. She is allergic to penicillins and vibramycin [doxycycline calcium].  Allergy Precautions: None required Coagulopathies: Reviewed. None identified.  Blood-thinner therapy: None at this time Active Infection(s): Reviewed. None identified. Michele Moore is afebrile  Site Confirmation: Michele Moore was asked to confirm the procedure and laterality before marking the site Procedure checklist: Completed Consent: Before the procedure and under the influence of no sedative(s), amnesic(s), or anxiolytics, the patient was informed of the treatment options, risks and possible complications. To fulfill our ethical and legal obligations, as recommended by the American Medical Association's Code of Ethics, I have informed the patient of my clinical impression; the nature and purpose of the treatment or procedure; the risks, benefits, and possible complications of the intervention; the alternatives, including doing nothing; the risk(s) and benefit(s) of the alternative  treatment(s) or procedure(s); and the risk(s) and benefit(s) of doing nothing. The patient was provided information about the general risks and possible complications associated with the procedure. These may include, but are not limited to: failure to achieve desired goals, infection,  bleeding, organ or nerve damage, allergic reactions, paralysis, and death. In addition, the patient was informed of those risks and complications associated to Spine-related procedures, such as failure to decrease pain; infection (i.e.: Meningitis, epidural or intraspinal abscess); bleeding (i.e.: epidural hematoma, subarachnoid hemorrhage, or any other type of intraspinal or peri-dural bleeding); organ or nerve damage (i.e.: Any type of peripheral nerve, nerve root, or spinal cord injury) with subsequent damage to sensory, motor, and/or autonomic systems, resulting in permanent pain, numbness, and/or weakness of one or several areas of the body; allergic reactions; (i.e.: anaphylactic reaction); and/or death. Furthermore, the patient was informed of those risks and complications associated with the medications. These include, but are not limited to: allergic reactions (i.e.: anaphylactic or anaphylactoid reaction(s)); adrenal axis suppression; blood sugar elevation that in diabetics may result in ketoacidosis or comma; water retention that in patients with history of congestive heart failure may result in shortness of breath, pulmonary edema, and decompensation with resultant heart failure; weight gain; swelling or edema; medication-induced neural toxicity; particulate matter embolism and blood vessel occlusion with resultant organ, and/or nervous system infarction; and/or aseptic necrosis of one or more joints. Finally, the patient was informed that Medicine is not an exact science; therefore, there is also the possibility of unforeseen or unpredictable risks and/or possible complications that may result in a catastrophic  outcome. The patient indicated having understood very clearly. We have given the patient no guarantees and we have made no promises. Enough time was given to the patient to ask questions, all of which were answered to the patient's satisfaction. Michele Moore has indicated that she wanted to continue with the procedure. Attestation: I, the ordering provider, attest that I have discussed with the patient the benefits, risks, side-effects, alternatives, likelihood of achieving goals, and potential problems during recovery for the procedure that I have provided informed consent. Date  Time: 12/16/2017  8:26 AM  Pre-Procedure Preparation:  Monitoring: As per clinic protocol. Respiration, ETCO2, SpO2, BP, heart rate and rhythm monitor placed and checked for adequate function Safety Precautions: Patient was assessed for positional comfort and pressure points before starting the procedure. Time-out: I initiated and conducted the "Time-out" before starting the procedure, as per protocol. The patient was asked to participate by confirming the accuracy of the "Time Out" information. Verification of the correct person, site, and procedure were performed and confirmed by me, the nursing staff, and the patient. "Time-out" conducted as per Joint Commission's Universal Protocol (UP.01.01.01). Time: 0924  Description of Procedure:          Laterality: Bilateral. The procedure was performed in identical fashion on both sides. Level:  C4, C5, C6, & C7 Medial Branch Level(s). Area Prepped: Posterior Cervico-thoracic Region Prepping solution: ChloraPrep (2% chlorhexidine gluconate and 70% isopropyl alcohol) Safety Precautions: Aspiration looking for blood return was conducted prior to all injections. At no point did we inject any substances, as a needle was being advanced. Before injecting, the patient was told to immediately notify me if she was experiencing any new onset of "ringing in the ears, or metallic taste in the  mouth". No attempts were made at seeking any paresthesias. Safe injection practices and needle disposal techniques used. Medications properly checked for expiration dates. SDV (single dose vial) medications used. After the completion of the procedure, all disposable equipment used was discarded in the proper designated medical waste containers. Local Anesthesia: Protocol guidelines were followed. The patient was positioned over the fluoroscopy table. The area was prepped in the usual manner.  The time-out was completed. The target area was identified using fluoroscopy. A 12-in long, straight, sterile hemostat was used with fluoroscopic guidance to locate the targets for each level blocked. Once located, the skin was marked with an approved surgical skin marker. Once all sites were marked, the skin (epidermis, dermis, and hypodermis), as well as deeper tissues (fat, connective tissue and muscle) were infiltrated with a small amount of a short-acting local anesthetic, loaded on a 10cc syringe with a 25G, 1.5-in  Needle. An appropriate amount of time was allowed for local anesthetics to take effect before proceeding to the next step. Local Anesthetic: Lidocaine 2.0% The unused portion of the local anesthetic was discarded in the proper designated containers. Technical explanation of process:   C4 Medial Branch Nerve Block (MBB): The target area for the C4 dorsal medial articular branch is the lateral concave waist of the articular pillar of C4. Under fluoroscopic guidance, a Quincke needle was inserted until contact was made with os over the postero-lateral aspect of the articular pillar of C4 (target area). After negative aspiration for blood,44mL of the nerve block solution was injected without difficulty or complication. The needle was removed intact. C5 Medial Branch Nerve Block (MBB): The target area for the C5 dorsal medial articular branch is the lateral concave waist of the articular pillar of C5. Under  fluoroscopic guidance, a Quincke needle was inserted until contact was made with os over the postero-lateral aspect of the articular pillar of C5 (target area). After negative aspiration for blood, 1 mL of the nerve block solution was injected without difficulty or complication. The needle was removed intact. C6 Medial Branch Nerve Block (MBB): The target area for the C6 dorsal medial articular branch is the lateral concave waist of the articular pillar of C6. Under fluoroscopic guidance, a Quincke needle was inserted until contact was made with os over the postero-lateral aspect of the articular pillar of C6 (target area). After negative aspiration for blood, 1 mL of the nerve block solution was injected without difficulty or complication. The needle was removed intact. C7 Medial Branch Nerve Block (MBB): The target for the C7 dorsal medial articular branch lies on the superior-medial tip of the C7 transverse process. Under fluoroscopic guidance, a Quincke needle was inserted until contact was made with os over the postero-lateral aspect of the articular pillar of C7 (target area). After negative aspiration for blood,58mL of the nerve block solution was injected without difficulty or complication. The needle was removed intact. Procedural Needles: 22-gauge, 3.5-inch, Quincke needles used for all levels. Nerve block solution: 8 cc solution made of 7 cc of 0.2% ropivacaine, 1 cc of Decadron 10 mg/cc.  1 cc injected at each level above bilaterally.  The unused portion of the solution was discarded in the proper designated containers.  Once the entire procedure was completed, the treated area was cleaned, making sure to leave some of the prepping solution back to take advantage of its long term bactericidal properties.  Vitals:   12/16/17 0950 12/16/17 1000 12/16/17 1011 12/16/17 1022  BP: (!) 147/78 122/61 122/65 128/71  Pulse: 75     Resp: 15 15 19 13   Temp:   (!) 97.5 F (36.4 C)   TempSrc:      SpO2:  98% 100% 100% 100%  Weight:      Height:        Start Time: 0924 hrs. End Time: 0950 hrs.  Imaging Guidance (Spinal):  Type of Imaging Technique: Fluoroscopy Guidance (Spinal) Indication(s): Assistance in needle guidance and placement for procedures requiring needle placement in or near specific anatomical locations not easily accessible without such assistance. Exposure Time: Please see nurses notes. Contrast: None used. Fluoroscopic Guidance: I was personally present during the use of fluoroscopy. "Tunnel Vision Technique" used to obtain the best possible view of the target area. Parallax error corrected before commencing the procedure. "Direction-depth-direction" technique used to introduce the needle under continuous pulsed fluoroscopy. Once target was reached, antero-posterior, oblique, and lateral fluoroscopic projection used confirm needle placement in all planes. Images permanently stored in EMR. Interpretation: No contrast injected. I personally interpreted the imaging intraoperatively. Adequate needle placement confirmed in multiple planes. Permanent images saved into the patient's record.  Antibiotic Prophylaxis:   Anti-infectives (From admission, onward)   None     Indication(s): None identified  Post-operative Assessment:  Post-procedure Vital Signs:  Pulse/HCG Rate: 7571 Temp: (!) 97.5 F (36.4 C) Resp: 13 BP: 128/71 SpO2: 100 %  EBL: None  Complications: No immediate post-treatment complications observed by team, or reported by patient.  Note: The patient tolerated the entire procedure well. A repeat set of vitals were taken after the procedure and the patient was kept under observation following institutional policy, for this type of procedure. Post-procedural neurological assessment was performed, showing return to baseline, prior to discharge. The patient was provided with post-procedure discharge instructions, including a section on how to identify  potential problems. Should any problems arise concerning this procedure, the patient was given instructions to immediately contact us, at any time, without hesitation. In any case, we plan to contact the patient by telephone for a follow-up status report regarding this interventional procedure.  Comments:  No additional relevant information. 5 out of 5 strength bilateral upper extremity: Shoulder abduction, elbow flexion, elbow extension, thumb extension.  Plan of Care   Imaging Orders     DG C-Arm 1-60 Min-No Report Procedure Orders    No procedure(s) ordered today    Medications ordered for procedure: Meds ordered this encounter  Medications  . lactated ringers infusion 1,000 mL  . fentaNYL (SUBLIMAZE) injection 25-100 mcg    Make sure Narcan is available in the pyxis when using this medication. In the event of respiratory depression (RR< 8/min): Titrate NARCAN (naloxone) in increments of 0.1 to 0.2 mg IV at 2-3 minute intervals, until desired degree of reversal.  . ropivacaine (PF) 2 mg/mL (0.2%) (NAROPIN) injection 10 mL  . lidocaine (XYLOCAINE) 2 % (with pres) injection 400 mg  . dexamethasone (DECADRON) injection 10 mg  . dexamethasone (DECADRON) injection 10 mg  . dexamethasone (DECADRON) injection 10 mg   Medications administered: We administered lactated ringers, fentaNYL, ropivacaine (PF) 2 mg/mL (0.2%), lidocaine, dexamethasone, dexamethasone, and dexamethasone.  See the medical record for exact dosing, route, and time of administration.  Disposition: Discharge home  Discharge Date & Time: 12/16/2017; 1023 hrs.   Physician-requested Follow-up: No follow-ups on file.  Future Appointments  Date Time Provider [place]  12/29/2017  1:30 PM Gillis Santa, MD [facility] None  03/01/2018 10:00 AM [place] ADVISOR [facility] None   Primary Care Physician: Juline Patch, MD Location: [facility] Note by: Gillis Santa, MD Date:  12/16/2017; Time: 10:40 AM  Disclaimer:  Medicine is not an exact science. The only guarantee in medicine is that nothing is guaranteed. It is important to note that the decision to proceed with this intervention was based on the information collected  from the patient. The Data and conclusions were drawn from the patient's questionnaire, the interview, and the physical examination. Because the information was provided in large part by the patient, it cannot be guaranteed that it has not been purposely or unconsciously manipulated. Every effort has been made to obtain as much relevant data as possible for this evaluation. It is important to note that the conclusions that lead to this procedure are derived in large part from the available data. Always take into account that the treatment will also be dependent on availability of resources and existing treatment guidelines, considered by other Pain Management Practitioners as being common knowledge and practice, at the time of the intervention. For Medico-Legal purposes, it is also important to point out that variation in procedural techniques and pharmacological choices are the acceptable norm. The indications, contraindications, technique, and results of the above procedure should only be interpreted and judged by a Board-Certified Interventional Pain Specialist with extensive familiarity and expertise in the same exact procedure and technique.

## 2017-12-16 NOTE — Progress Notes (Signed)
Safety precautions to be maintained throughout the outpatient stay will include: orient to surroundings, keep bed in low position, maintain call bell within reach at all times, provide assistance with transfer out of bed and ambulation.  

## 2017-12-16 NOTE — Patient Instructions (Addendum)
Please be aware that patient does have hardware in her body from previous surgeries  Post-procedure Information What to expect: Most procedures involve the use of a local anesthetic (numbing medicine), and a steroid (anti-inflammatory medicine).  The local anesthetics may cause temporary numbness and weakness of the legs or arms, depending on the location of the block. This numbness/weakness may last 4-6 hours, depending on the local anesthetic used. In rare instances, it can last up to 24 hours. While numb, you must be very careful not to injure the extremity.  After any procedure, you could expect the pain to get better within 15-20 minutes. This relief is temporary and may last 4-6 hours. Once the local anesthetics wears off, you could experience discomfort, possibly more than usual, for up to 10 (ten) days. In the case of radiofrequencies, it may last up to 6 weeks. Surgeries may take up to 8 weeks for the healing process. The discomfort is due to the irritation caused by needles going through skin and muscle. To minimize the discomfort, we recommend using ice the first day, and heat from then on. The ice should be applied for 15 minutes on, and 15 minutes off. Keep repeating this cycle until bedtime. Avoid applying the ice directly to the skin, to prevent frostbite. Heat should be used daily, until the pain improves (4-10 days). Be careful not to burn yourself.  Occasionally you may experience muscle spasms or cramps. These occur as a consequence of the irritation caused by the needle sticks to the muscle and the blood that will inevitably be lost into the surrounding muscle tissue. Blood tends to be very irritating to tissues, which tend to react by going into spasm. These spasms may start the same day of your procedure, but they may also take days to develop. This late onset type of spasm or cramp is usually caused by electrolyte imbalances triggered by the steroids, at the level of the kidney.  Cramps and spasms tend to respond well to muscle relaxants, multivitamins (some are triggered by the procedure, but may have their origins in vitamin deficiencies), and "Gatorade", or any sports drinks that can replenish any electrolyte imbalances. (If you are a diabetic, ask your pharmacist to get you a sugar-free brand.) Warm showers or baths may also be helpful. Stretching exercises are highly recommended. General Instructions:  Be alert for signs of possible infection: redness, swelling, heat, red streaks, elevated temperature, and/or fever. These typically appear 4 to 6 days after the procedure. Immediately notify your doctor if you experience unusual bleeding, difficulty breathing, or loss of bowel or bladder control. If you experience increased pain, do not increase your pain medicine intake, unless instructed by your pain physician. Post-Procedure Care:  Be careful in moving about. Muscle spasms in the area of the injection may occur. Applying ice or heat to the area is often helpful. The incidence of spinal headaches after epidural injections ranges between 1.4% and 6%. If you develop a headache that does not seem to respond to conservative therapy, please let your physician know. This can be treated with an epidural blood patch.   Post-procedure numbness or redness is to be expected, however it should average 4 to 6 hours. If numbness and weakness of your extremities begins to develop 4 to 6 hours after your procedure, and is felt to be progressing and worsening, immediately contact your physician.   Diet:  If you experience nausea, do not eat until this sensation goes away. If you had a "Stellate  Ganglion Block" for upper extremity "Reflex Sympathetic Dystrophy", do not eat or drink until your hoarseness goes away. In any case, always start with liquids first and if you tolerate them well, then slowly progress to more solid foods. Activity:  For the first 4 to 6 hours after the procedure, use  caution in moving about as you may experience numbness and/or weakness. Use caution in cooking, using household electrical appliances, and climbing steps. If you need to reach your Doctor call our office: [phone]) [phone] Monday-Thursday 8:00 am - 4:00 PM    Fridays: Closed     In case of an emergency: In case of emergency, call 911 or go to the nearest emergency room and have the physician there call us.  Interpretation of Procedure Every nerve block has two components: a diagnostic component, and a treatment component. Unrealistic expectations are the most common causes of "perceived failure".  In a perfect world, a single nerve block should be able to completely and permanently eliminate the pain. Sadly, the world is not perfect.  Most pain management nerve blocks are performed using local anesthetics and steroids. Steroids are responsible for any long-term benefit that you may experience. Their purpose is to decrease any chronic swelling that may exist in the area. Steroids begin to work immediately after being injected. However, most patients will not experience any benefits until 5 to 10 days after the injection, when the swelling has come down to the point where they can tell a difference. Steroids will only help if there is swelling to be treated. As such, they can assist with the diagnosis. If effective, they suggest an inflammatory component to the pain, and if ineffective, they rule out inflammation as the main cause or component of the problem. If the problem is one of mechanical compression, you will get no benefit from those steroids.   In the case of local anesthetics, they have a crucial role in the diagnosis of your condition. Most will begin to work within15 to 20 minutes after injection. The duration will depend on the type used (short- vs. Long-acting). It is of outmost importance that patients keep tract of their pain, after the procedure. To assist with this matter, a "Post-procedure  Pain Diary" is provided. Make sure to complete it and to bring it back to your follow-up appointment.  As long as the patient keeps accurate, detailed records of their symptoms after every procedure, and returns to have those interpreted, every procedure will provide [place] with invaluable information. Even a block that does not provide the patient with any relief, will always provide [place] with information about the mechanism and the origin of the pain. The only time a nerve block can be considered a waste of time is when patients do not keep track of the results, or do not keep their post-procedure appointment.  Reporting the results back to your physician The Pain Score  Pain is a subjective complaint. It cannot be seen, touched, or measured. We depend entirely on the patient's report of the pain in order to assess your condition and treatment. To evaluate the pain, we use a pain scale, where "0" means "No Pain", and a "10" is "the worst possible pain that you can even imagine" (i.e. something like been eaten alive by a shark or being torn apart by a lion).   You will frequently be asked to rate your pain. Please be as accurate, remember that medical decisions will be based on your responses. Please do not rate  your pain above a 10. Doing so is actually interpreted as "symptom magnification" (exaggeration), as well as lack of understanding with regards to the scale. To put this into perspective, when you tell us that your pain is at a 10 (ten), what you are saying is that there is nothing we can do to make this pain any worse. (Carefully think about that.)

## 2017-12-27 ENCOUNTER — Other Ambulatory Visit: Payer: Self-pay | Admitting: Family Medicine

## 2017-12-28 ENCOUNTER — Other Ambulatory Visit: Payer: Self-pay | Admitting: Family Medicine

## 2017-12-29 ENCOUNTER — Ambulatory Visit
Payer: Medicare Other | Attending: Student in an Organized Health Care Education/Training Program | Admitting: Student in an Organized Health Care Education/Training Program

## 2017-12-29 ENCOUNTER — Other Ambulatory Visit: Payer: Self-pay

## 2017-12-29 ENCOUNTER — Encounter: Payer: Self-pay | Admitting: Student in an Organized Health Care Education/Training Program

## 2017-12-29 VITALS — BP 99/70 | HR 111 | Temp 98.7°F | Resp 18 | Ht 71.0 in | Wt 246.0 lb

## 2017-12-29 DIAGNOSIS — M19011 Primary osteoarthritis, right shoulder: Secondary | ICD-10-CM | POA: Diagnosis not present

## 2017-12-29 DIAGNOSIS — E782 Mixed hyperlipidemia: Secondary | ICD-10-CM | POA: Diagnosis not present

## 2017-12-29 DIAGNOSIS — Z7982 Long term (current) use of aspirin: Secondary | ICD-10-CM | POA: Insufficient documentation

## 2017-12-29 DIAGNOSIS — M48062 Spinal stenosis, lumbar region with neurogenic claudication: Secondary | ICD-10-CM | POA: Diagnosis not present

## 2017-12-29 DIAGNOSIS — K219 Gastro-esophageal reflux disease without esophagitis: Secondary | ICD-10-CM | POA: Diagnosis not present

## 2017-12-29 DIAGNOSIS — Z6834 Body mass index (BMI) 34.0-34.9, adult: Secondary | ICD-10-CM | POA: Insufficient documentation

## 2017-12-29 DIAGNOSIS — Z7951 Long term (current) use of inhaled steroids: Secondary | ICD-10-CM | POA: Insufficient documentation

## 2017-12-29 DIAGNOSIS — M25562 Pain in left knee: Secondary | ICD-10-CM | POA: Insufficient documentation

## 2017-12-29 DIAGNOSIS — Z87891 Personal history of nicotine dependence: Secondary | ICD-10-CM | POA: Insufficient documentation

## 2017-12-29 DIAGNOSIS — M25561 Pain in right knee: Secondary | ICD-10-CM | POA: Insufficient documentation

## 2017-12-29 DIAGNOSIS — Z825 Family history of asthma and other chronic lower respiratory diseases: Secondary | ICD-10-CM | POA: Diagnosis not present

## 2017-12-29 DIAGNOSIS — M47812 Spondylosis without myelopathy or radiculopathy, cervical region: Secondary | ICD-10-CM | POA: Diagnosis not present

## 2017-12-29 DIAGNOSIS — Z981 Arthrodesis status: Secondary | ICD-10-CM | POA: Insufficient documentation

## 2017-12-29 DIAGNOSIS — J45909 Unspecified asthma, uncomplicated: Secondary | ICD-10-CM | POA: Diagnosis not present

## 2017-12-29 DIAGNOSIS — Z881 Allergy status to other antibiotic agents status: Secondary | ICD-10-CM | POA: Diagnosis not present

## 2017-12-29 DIAGNOSIS — Z79890 Hormone replacement therapy (postmenopausal): Secondary | ICD-10-CM | POA: Insufficient documentation

## 2017-12-29 DIAGNOSIS — D509 Iron deficiency anemia, unspecified: Secondary | ICD-10-CM | POA: Diagnosis not present

## 2017-12-29 DIAGNOSIS — Z86010 Personal history of colonic polyps: Secondary | ICD-10-CM | POA: Diagnosis not present

## 2017-12-29 DIAGNOSIS — G894 Chronic pain syndrome: Secondary | ICD-10-CM | POA: Insufficient documentation

## 2017-12-29 DIAGNOSIS — E039 Hypothyroidism, unspecified: Secondary | ICD-10-CM | POA: Diagnosis not present

## 2017-12-29 DIAGNOSIS — I129 Hypertensive chronic kidney disease with stage 1 through stage 4 chronic kidney disease, or unspecified chronic kidney disease: Secondary | ICD-10-CM | POA: Insufficient documentation

## 2017-12-29 DIAGNOSIS — M5116 Intervertebral disc disorders with radiculopathy, lumbar region: Secondary | ICD-10-CM | POA: Insufficient documentation

## 2017-12-29 DIAGNOSIS — Z880 Allergy status to penicillin: Secondary | ICD-10-CM | POA: Insufficient documentation

## 2017-12-29 DIAGNOSIS — Z8711 Personal history of peptic ulcer disease: Secondary | ICD-10-CM | POA: Diagnosis not present

## 2017-12-29 DIAGNOSIS — M542 Cervicalgia: Secondary | ICD-10-CM | POA: Diagnosis present

## 2017-12-29 DIAGNOSIS — Z79899 Other long term (current) drug therapy: Secondary | ICD-10-CM | POA: Insufficient documentation

## 2017-12-29 DIAGNOSIS — E669 Obesity, unspecified: Secondary | ICD-10-CM | POA: Diagnosis not present

## 2017-12-29 MED ORDER — HYDROCODONE-ACETAMINOPHEN 7.5-325 MG PO TABS: 1.0000 | ORAL_TABLET | Freq: Three times a day (TID) | ORAL | 0 refills | Status: DC | PRN

## 2017-12-29 MED ORDER — TIZANIDINE HCL 4 MG PO CAPS: 4.0000 mg | ORAL_CAPSULE | Freq: Two times a day (BID) | ORAL | 2 refills | Status: DC | PRN

## 2017-12-29 NOTE — Progress Notes (Signed)
Nursing Pain Medication Assessment:  Safety precautions to be maintained throughout the outpatient stay will include: orient to surroundings, keep bed in low position, maintain call bell within reach at all times, provide assistance with transfer out of bed and ambulation.  Medication Inspection Compliance: Pill count conducted under aseptic conditions, in front of the patient. Neither the pills nor the bottle was removed from the patient's sight at any time. Once count was completed pills were immediately returned to the patient in their original bottle.  Medication: Hydrocodone/APAP Pill/Patch Count: 3 of 90 pills remain Pill/Patch Appearance: Markings consistent with prescribed medication Bottle Appearance: Standard pharmacy container. Clearly labeled. Filled Date: 21 / 05 / 2019 Last Medication intake:  Yesterday

## 2017-12-29 NOTE — Progress Notes (Signed)
Patient's Name: Michele Moore  MRN: [id number]  Referring Provider: Juline Patch, MD  DOB: 05-Jul-1950  PCP: Juline Patch, MD  DOS: 12/29/2017  Note by: Gillis Santa, MD  Service setting: Ambulatory outpatient  Specialty: Interventional Pain Management  Location: [facility] (AMB) Pain Management Facility    Patient type: Established   Primary Reason(s) for Visit: Encounter for prescription drug management & post-procedure evaluation of chronic illness with mild to moderate exacerbation(Level of risk: moderate) CC: Neck Pain; Hip Pain (left); and Knee Pain (bilateral, right is worse)  HPI  Michele Moore is a 67 y.o. year old, female patient, who comes today for a post-procedure evaluation and medication management. She has Pain, lower extremity; Lumbar radiculopathy; Lumbar degenerative disc disease; Chronic pain syndrome; Spinal stenosis, lumbar region, with neurogenic claudication; Cervical facet syndrome; Osteoarthritis of right shoulder due to rotator cuff injury; Sprain of anterior talofibular ligament of right ankle; Severe obesity (BMI 35.0-35.9 with comorbidity) ([place]); Bradycardia; Hypertension; Hypothyroidism; Gastroesophageal reflux disease; Hypokalemia; Mixed hyperlipidemia; Reactive airway disease; Iron deficiency anemia due to chronic blood loss; Heme + stool; Acute gastritis without hemorrhage; Gastric polyps; Benign neoplasm of ascending colon; Benign neoplasm of transverse colon; and Polyp of sigmoid colon on their problem list. Her primarily concern today is the Neck Pain; Hip Pain (left); and Knee Pain (bilateral, right is worse)  Pain Assessment: Location:   Neck Radiating: to shoulders bilaterally, right side is worse Onset: More than a month ago Duration: Chronic pain Quality: Sharp, Numbness, Constant Severity: 7 /10 (subjective, self-reported pain score)  Note: Reported level is inconsistent with clinical observations.                         When using our objective Pain Scale,  levels between 6 and 10/10 are said to belong in an emergency room, as it progressively worsens from a 6/10, described as severely limiting, requiring emergency care not usually available at an outpatient pain management facility. At a 6/10 level, communication becomes difficult and requires great effort. Assistance to reach the emergency department may be required. Facial flushing and profuse sweating along with potentially dangerous increases in heart rate and blood pressure will be evident. Effect on ADL: "my hands are swollen", limits daily activities Timing: Constant Modifying factors: meds, procedure BP: 99/70  HR: (!) 111  Michele Moore was last seen on 12/16/2017 for a procedure. During today's appointment we reviewed Michele Moore's post-procedure results, as well as her outpatient medication regimen.  Further details on both, my assessment(s), as well as the proposed treatment plan, please see below.  Controlled Substance Pharmacotherapy Assessment REMS (Risk Evaluation and Mitigation Strategy)  Analgesic: Hydrocodone 7.5 mill grams 3 times daily as needed, quantity 90/month MME/day: 22.5 mg/day.  Rise Patience, RN  12/29/2017  1:30 PM  Sign at close encounter Nursing Pain Medication Assessment:  Safety precautions to be maintained throughout the outpatient stay will include: orient to surroundings, keep bed in low position, maintain call bell within reach at all times, provide assistance with transfer out of bed and ambulation.  Medication Inspection Compliance: Pill count conducted under aseptic conditions, in front of the patient. Neither the pills nor the bottle was removed from the patient's sight at any time. Once count was completed pills were immediately returned to the patient in their original bottle.  Medication: Hydrocodone/APAP Pill/Patch Count: 3 of 90 pills remain Pill/Patch Appearance: Markings consistent with prescribed medication Bottle Appearance: Standard pharmacy  container. Clearly labeled. Filled  Date: 22 / 05 / 2019 Last Medication intake:  Yesterday   Pharmacokinetics: Liberation and absorption (onset of action): WNL Distribution (time to peak effect): WNL Metabolism and excretion (duration of action): WNL         Pharmacodynamics: Desired effects: Analgesia: Michele Moore reports >50% benefit. Functional ability: Patient reports that medication allows her to accomplish basic ADLs Clinically meaningful improvement in function (CMIF): Sustained CMIF goals met Perceived effectiveness: Described as relatively effective, allowing for increase in activities of daily living (ADL) Undesirable effects: Side-effects or Adverse reactions: None reported Monitoring: [place] PMP: Online review of the past 58-monthperiod conducted. Compliant with practice rules and regulations Last UDS on record: Summary  Date Value Ref Range Status  10/14/2016 FINAL  Final    Comment:    ==================================================================== [facility] COMP DRUG ANALYSIS,UR ==================================================================== Test                             Result       Flag       Units Drug Present and Declared for Prescription Verification   Gabapentin                     PRESENT      EXPECTED   Acetaminophen                  PRESENT      EXPECTED Drug Present not Declared for Prescription Verification   Dextrorphan/Levorphanol        PRESENT      UNEXPECTED    Dextrorphan is an expected metabolite of dextromethorphan, an    over-the-counter or prescription cough suppressant. Levorphanol    is a scheduled prescription medication. Dextrorphan cannot be    distinguished from levorphanol by the method used for analysis. Drug Absent but Declared for Prescription Verification   Hydrocodone                    Not Detected UNEXPECTED ng/mg creat   Oxycodone                      Not Detected UNEXPECTED ng/mg creat   Ephedrine/Pseudoephedrine       Not Detected UNEXPECTED   Tramadol                       Not Detected UNEXPECTED   Pregabalin                     Not Detected UNEXPECTED   Tizanidine                     Not Detected UNEXPECTED    Tizanidine, as indicated in the declared medication list, is not    always detected even when used as directed.   Duloxetine                     Not Detected UNEXPECTED   Salicylate                     Not Detected UNEXPECTED    Aspirin, as indicated in the declared medication list, is not    always detected even when used as directed.   Chlorpheniramine               Not Detected UNEXPECTED   Hydroxyzine  Not Detected UNEXPECTED   Lidocaine                      Not Detected UNEXPECTED    Lidocaine, as indicated in the declared medication list, is not    always detected even when used as directed. ==================================================================== Test                      Result    Flag   Units      Ref Range   Creatinine              101              mg/dL      >=20 ==================================================================== Declared Medications:  The flagging and interpretation on this report are based on the  following declared medications.  Unexpected results may arise from  inaccuracies in the declared medications.  **Note: The testing scope of this panel includes these medications:  Chlorpheniramine (Tussionex)  Duloxetine  Gabapentin  Hydrocodone (Hydrocodone-Acetaminophen)  Hydrocodone (Tussionex)  Hydroxyzine  Oxycodone (Oxycodone Acetaminophen)  Pregabalin  Pseudoephedrine (Fexofenadine Pseudoephedrine)  Tramadol  **Note: The testing scope of this panel does not include small to  moderate amounts of these reported medications:  Acetaminophen  Acetaminophen (Hydrocodone-Acetaminophen)  Acetaminophen (Oxycodone Acetaminophen)  Aspirin  Lidocaine  Tizanidine  **Note: The testing scope of this panel does not include following   reported medications:  Cefuroxime axetil  Fexofenadine (Fexofenadine Pseudoephedrine)  Hydralazine  Hydrochlorothiazide (Triamterine-Hydrochlorthzide)  Ketoconazole  Levothyroxine  Meloxicam  Mupirocin  Nystatin  Omeprazole  Polyethylene Glycol  Polymyxin  Potassium  Prednisone  Ranitidine  Supplement (Omega-3)  Topical  Triamcinolone acetonide  Triamterene (Triamterine-Hydrochlorthzide)  Trimethoprim ==================================================================== For clinical consultation, please call [phone]. ====================================================================    UDS interpretation: Compliant          Medication Assessment Form: Reviewed. Patient indicates being compliant with therapy Treatment compliance: Compliant Risk Assessment Profile: Aberrant behavior: See prior evaluations. None observed or detected today Comorbid factors increasing risk of overdose: See prior notes. No additional risks detected today Opioid risk tool (ORT) (Total Score): 0 Personal History of Substance Abuse (SUD-Substance use disorder):  Alcohol: Negative  Illegal Drugs: Negative  Rx Drugs: Negative  ORT Risk Level calculation: Low Risk Risk of substance use disorder (SUD): Low Opioid Risk Tool - 12/29/17 1340      Family History of Substance Abuse   Alcohol  Negative    Illegal Drugs  Negative    Rx Drugs  Negative      Personal History of Substance Abuse   Alcohol  Negative    Illegal Drugs  Negative    Rx Drugs  Negative      Age   Age between 64-45 years   No      History of Preadolescent Sexual Abuse   History of Preadolescent Sexual Abuse  Negative or Female      Psychological Disease   Psychological Disease  Negative    Depression  Negative      Total Score   Opioid Risk Tool Scoring  0    Opioid Risk Interpretation  Low Risk      ORT Scoring interpretation table:  Score <3 = Low Risk for SUD  Score between 4-7 = Moderate Risk for SUD    Score >8 = High Risk for Opioid Abuse   Risk Mitigation Strategies:  Patient Counseling: Covered Patient-Prescriber Agreement (PPA): Present and active  Notification to other healthcare  providers: Done  Pharmacologic Plan: No change in therapy, at this time.             Post-Procedure Assessment  12/16/2017 Procedure: Bilateral C4, C5, C6, C7 cervical facet medial branch nerve block Pre-procedure pain score:  8/10 Post-procedure pain score: 0/10         Influential Factors: BMI: 34.31 kg/m Intra-procedural challenges: None observed.         Assessment challenges: None detected.              Reported side-effects: None.        Post-procedural adverse reactions or complications: None reported         Sedation: Please see nurses note. When no sedatives are used, the analgesic levels obtained are directly associated to the effectiveness of the local anesthetics. However, when sedation is provided, the level of analgesia obtained during the initial 1 hour following the intervention, is believed to be the result of a combination of factors. These factors may include, but are not limited to: 1. The effectiveness of the local anesthetics used. 2. The effects of the analgesic(s) and/or anxiolytic(s) used. 3. The degree of discomfort experienced by the patient at the time of the procedure. 4. The patients ability and reliability in recalling and recording the events. 5. The presence and influence of possible secondary gains and/or psychosocial factors. Reported result: Relief experienced during the 1st hour after the procedure: 100 % (Ultra-Short Term Relief)            Interpretative annotation: Clinically appropriate result. Analgesia during this period is likely to be Local Anesthetic and/or IV Sedative (Analgesic/Anxiolytic) related.          Effects of local anesthetic: The analgesic effects attained during this period are directly associated to the localized infiltration of local  anesthetics and therefore cary significant diagnostic value as to the etiological location, or anatomical origin, of the pain. Expected duration of relief is directly dependent on the pharmacodynamics of the local anesthetic used. Long-acting (4-6 hours) anesthetics used.  Reported result: Relief during the next 4 to 6 hour after the procedure: 100 % (Short-Term Relief)            Interpretative annotation: Clinically appropriate result. Analgesia during this period is likely to be Local Anesthetic-related.          Long-term benefit: Defined as the period of time past the expected duration of local anesthetics (1 hour for short-acting and 4-6 hours for long-acting). With the possible exception of prolonged sympathetic blockade from the local anesthetics, benefits during this period are typically attributed to, or associated with, other factors such as analgesic sensory neuropraxia, antiinflammatory effects, or beneficial biochemical changes provided by agents other than the local anesthetics.  Reported result: Extended relief following procedure: 100 %(lasted 2 weeks then back to "where I was") (Long-Term Relief)            Interpretative annotation: Clinically possible results. Good relief. No permanent benefit expected. Inflammation plays a part in the etiology to the pain.          Current benefits: Defined as reported results that persistent at this point in time.   Analgesia: 30% on the right side, 100% on the left side %            Function: Somewhat improved ROM: Somewhat improved Interpretative annotation: Recurrence of symptoms.  Primarily on the right  Effective diagnostic intervention.          Interpretation: Results would  suggest a successful diagnostic and therapeutic intervention.                  Plan:  Please see "Plan of Care" for details.                Laboratory Chemistry  Inflammation Markers (CRP: Acute Phase) (ESR: Chronic Phase) Lab Results  Component Value Date   CRP  <0.8 11/05/2017   ESRSEDRATE 52 (H) 11/05/2017                         Rheumatology Markers No results found for: RF, ANA, LABURIC, URICUR, LYMEIGGIGMAB, LYMEABIGMQN, HLAB27                      Renal Function Markers Lab Results  Component Value Date   BUN 36 (H) 11/05/2017   CREATININE 2.00 (H) 11/05/2017   BCR 15 10/01/2017   GFRAA 28 (L) 11/05/2017   GFRNONAA 24 (L) 11/05/2017                             Hepatic Function Markers Lab Results  Component Value Date   AST 23 11/05/2017   ALT 16 11/05/2017   ALBUMIN 4.2 11/05/2017   ALKPHOS 87 11/05/2017                        Electrolytes Lab Results  Component Value Date   NA 142 11/05/2017   K 5.2 (H) 11/05/2017   CL 109 11/05/2017   CALCIUM 9.9 11/05/2017   MG 2.2 12/27/2013   PHOS 3.0 10/01/2017                        Neuropathy Markers Lab Results  Component Value Date   HIV Non Reactive 08/25/2017                        CNS Tests No results found for: COLORCSF, APPEARCSF, RBCCOUNTCSF, WBCCSF, POLYSCSF, LYMPHSCSF, EOSCSF, PROTEINCSF, GLUCCSF, JCVIRUS, CSFOLI, IGGCSF                      Bone Pathology Markers No results found for: VD25OH, VD125OH2TOT, [id number], [id number], 25OHVITD1, 25OHVITD2, 25OHVITD3, TESTOFREE, TESTOSTERONE                       Coagulation Parameters Lab Results  Component Value Date   INR 0.94 11/05/2017   LABPROT 12.5 11/05/2017   APTT 34 11/05/2017   PLT 151 11/05/2017                        Cardiovascular Markers Lab Results  Component Value Date   CKTOTAL 154 12/27/2013   CKMB 0.8 12/27/2013   TROPONINI <0.03 09/07/2017   HGB 10.9 (L) 11/05/2017   HCT 32.9 (L) 11/05/2017                         CA Markers No results found for: CEA, CA125, LABCA2                      Note: Lab results reviewed.  Recent Diagnostic Imaging Results  DG C-Arm 1-60 Min-No Report Fluoroscopy was utilized by the requesting physician.  No radiographic  interpretation.   Complexity  Note:  Imaging results reviewed. Results shared with Ms. Florendo, using Layman's terms.                         Meds   Current Outpatient Medications:  .  albuterol (PROVENTIL HFA;VENTOLIN HFA) 108 (90 Base) MCG/ACT inhaler, Inhale 2 puffs into the lungs every 4 (four) hours as needed for wheezing., Disp: 1 Inhaler, Rfl: 5 .  allopurinol (ZYLOPRIM) 100 MG tablet, Take 2 tablets by mouth daily. Dr Barb Merino, Disp: , Rfl:  .  aspirin EC 81 MG tablet, Take 81 mg by mouth daily., Disp: , Rfl:  .  Cholecalciferol (VITAMIN D3) 2000 units TABS, Take 2,000 Units by mouth daily., Disp: , Rfl:  .  diclofenac sodium (VOLTAREN) 1 % GEL, Apply 2 g topically 3 (three) times daily. [facility], Disp: , Rfl:  .  ferrous sulfate 325 (65 FE) MG tablet, Take 325 mg by mouth daily with breakfast. otc, Disp: , Rfl:  .  fluticasone (FLONASE) 50 MCG/ACT nasal spray, SHAKE LIQUID AND USE 1 SPRAY IN EACH NOSTRIL DAILY, Disp: 48 g, Rfl: 0 .  gabapentin (NEURONTIN) 300 MG capsule, TAKE 2 CAPSULES BY MOUTH NIGHTLY/ Vivianne Carles (Patient taking differently: Take 600 mg by mouth at bedtime. ), Disp: 60 capsule, Rfl: 3 .  hydrALAZINE (APRESOLINE) 50 MG tablet, Take 1 tablet (50 mg total) by mouth 2 (two) times daily., Disp: 60 tablet, Rfl: 1 .  HYDROcodone-acetaminophen (NORCO) 7.5-325 MG tablet, Take 1 tablet by mouth 3 (three) times daily as needed for severe pain. For Chronic Pain DNF: 12/29/2017, 01/27/2018, 02/26/2018, Disp: 90 tablet, Rfl: 0 .  ketoconazole (NIZORAL) 2 % cream, U UTD QHS (Patient taking differently: Apply 1 application topically at bedtime. U UTD QHS), Disp: 30 g, Rfl: 0 .  levothyroxine (SYNTHROID) 200 MCG tablet, Take 1 tablet (200 mcg total) by mouth daily before breakfast., Disp: 30 tablet, Rfl: 5 .  meclizine (ANTIVERT) 12.5 MG tablet, Take 1 tablet (12.5 mg total) by mouth 2 (two) times daily as needed for dizziness., Disp: 30 tablet, Rfl: 0 .  Multiple Vitamins-Calcium (ONE-A-DAY WOMENS FORMULA PO),  Take 1 tablet by mouth daily., Disp: , Rfl:  .  nystatin cream (MYCOSTATIN), APPLY EXTERNALLY TO THE AFFECTED AREA TWICE DAILY, Disp: 30 g, Rfl: 0 .  omega-3 acid ethyl esters (LOVAZA) 1 g capsule, TAKE 2 CAPSULES BY MOUTH TWICE DAILY, Disp: 120 capsule, Rfl: 0 .  omeprazole (PRILOSEC) 40 MG capsule, Take 1 capsule (40 mg total) by mouth daily., Disp: 30 capsule, Rfl: 5 .  Polyethyl Glycol-Propyl Glycol (SYSTANE OP), Place 1 drop into both eyes daily as needed (dry eyes). , Disp: , Rfl:  .  polyethylene glycol (MIRALAX / GLYCOLAX) packet, Take 17 g by mouth daily. (Patient taking differently: Take 17 g by mouth daily as needed for mild constipation. ), Disp: 14 each, Rfl: 0 .  potassium chloride SA (K-DUR,KLOR-CON) 20 MEQ tablet, Take 0.5 tablets (10 mEq total) by mouth daily., Disp: 30 tablet, Rfl: 5 .  Propylene Glycol (SYSTANE BALANCE OP), Apply 1 drop to eye 2 (two) times daily., Disp: , Rfl:  .  ranitidine (ZANTAC) 150 MG tablet, Take 1 tablet (150 mg total) by mouth 2 (two) times daily., Disp: 60 tablet, Rfl: 5 .  tiZANidine (ZANAFLEX) 4 MG capsule, Take 1 capsule (4 mg total) by mouth 2 (two) times daily as needed., Disp: 60 capsule, Rfl: 2 .  triamcinolone cream (KENALOG) 0.1 %, Apply 1 application  topically every morning., Disp: , Rfl:  .  triamterene-hydrochlorothiazide (MAXZIDE) 75-50 MG tablet, Take 1 tablet by mouth daily., Disp: 30 tablet, Rfl: 5  ROS  Constitutional: Denies any fever or chills Gastrointestinal: No reported hemesis, hematochezia, vomiting, or acute GI distress Musculoskeletal: Denies any acute onset joint swelling, redness, loss of ROM, or weakness Neurological: No reported episodes of acute onset apraxia, aphasia, dysarthria, agnosia, amnesia, paralysis, loss of coordination, or loss of consciousness  Allergies  Ms. Market is allergic to penicillins and vibramycin [doxycycline calcium].  PFSH  Drug: Ms. Stachnik  reports that she does not use drugs. Alcohol:  reports  that she does not drink alcohol. Tobacco:  reports that she quit smoking about 24 years ago. She has never used smokeless tobacco. Medical:  has a past medical history of Anemia, Arthritis, Asthma, Chronic kidney disease, Collagen vascular disease ([place]), Family history of adverse reaction to anesthesia, GERD (gastroesophageal reflux disease), H/O tooth extraction, Hemorrhoid, History of hiatal hernia, Hypertension, Hypothyroidism, Migraines, Mixed hyperlipidemia, Multiple gastric ulcers, Thyroid disease, and Wears dentures. Surgical: Ms. Coryell  has a past surgical history that includes Carpal tunnel release; Rotator cuff repair; Ankle surgery; Tubal ligation; Colonoscopy (2000?); Upper gi endoscopy (2000?); Tonsillectomy; Fusion of talonavicular joint (Right, 08/03/2015); Colonoscopy with propofol (N/A, 10/22/2017); Esophagogastroduodenoscopy (egd) with propofol (N/A, 10/22/2017); polypectomy (N/A, 10/22/2017); and Joint replacement. Family: family history includes COPD in her sister; Cancer in her maternal aunt and maternal uncle.  Constitutional Exam  General appearance: Well nourished, well developed, and well hydrated. In no apparent acute distress Vitals:   12/29/17 1330  BP: 99/70  Pulse: (!) 111  Resp: 18  Temp: 98.7 F (37.1 C)  TempSrc: Oral  SpO2: 97%  Weight: 246 lb (111.6 kg)  Height: '5\' 11"'  (1.803 m)   BMI Assessment: Estimated body mass index is 34.31 kg/m as calculated from the following:   Height as of this encounter: '5\' 11"'  (1.803 m).   Weight as of this encounter: 246 lb (111.6 kg).  BMI interpretation table: BMI level Category Range association with higher incidence of chronic pain  <18 kg/m2 Underweight   18.5-24.9 kg/m2 Ideal body weight   25-29.9 kg/m2 Overweight Increased incidence by 20%  30-34.9 kg/m2 Obese (Class I) Increased incidence by 68%  35-39.9 kg/m2 Severe obesity (Class II) Increased incidence by 136%  >40 kg/m2 Extreme obesity (Class III) Increased  incidence by 254%   Patient's current BMI Ideal Body weight  Body mass index is 34.31 kg/m. Ideal body weight: 70.8 kg (156 lb 1.4 oz) Adjusted ideal body weight: 87.1 kg (192 lb 0.8 oz)   BMI Readings from Last 4 Encounters:  12/29/17 34.31 kg/m  12/16/17 34.31 kg/m  12/03/17 34.31 kg/m  11/16/17 34.73 kg/m   Wt Readings from Last 4 Encounters:  12/29/17 246 lb (111.6 kg)  12/16/17 246 lb (111.6 kg)  12/03/17 246 lb (111.6 kg)  11/16/17 249 lb (112.9 kg)  Psych/Mental status: Alert, oriented x 3 (person, place, & time)       Eyes: PERLA Respiratory: No evidence of acute respiratory distress  Cervical Spine Area Exam  Skin & Axial Inspection: No masses, redness, edema, swelling, or associated skin lesions Alignment: Symmetrical Functional ROM: Decreased ROM, to the right Stability: No instability detected Muscle Tone/Strength: Functionally intact. No obvious neuro-muscular anomalies detected. Sensory (Neurological): Articular pain pattern, right greater than left Palpation: Complains of area being tender to palpation Positive provocative maneuver for        Upper Extremity (UE) Exam  Side: Right upper extremity  Side: Left upper extremity  Skin & Extremity Inspection: Skin color, temperature, and hair growth are WNL. No peripheral edema or cyanosis. No masses, redness, swelling, asymmetry, or associated skin lesions. No contractures.  Skin & Extremity Inspection: Skin color, temperature, and hair growth are WNL. No peripheral edema or cyanosis. No masses, redness, swelling, asymmetry, or associated skin lesions. No contractures.  Functional ROM: Decreased ROM for shoulder  Functional ROM: Unrestricted ROM          Muscle Tone/Strength: Functionally intact. No obvious neuro-muscular anomalies detected.  Muscle Tone/Strength: Functionally intact. No obvious neuro-muscular anomalies detected.  Sensory (Neurological): Arthropathic arthralgia          Sensory (Neurological):  Unimpaired          Palpation: No palpable anomalies              Palpation: No palpable anomalies              Provocative Test(s):  Phalen's test: deferred Tinel's test: deferred Apley's scratch test (touch opposite shoulder):  Action 1 (Across chest): Decreased ROM Action 2 (Overhead): Decreased ROM Action 3 (LB reach): Decreased ROM   Provocative Test(s):  Phalen's test: deferred Tinel's test: deferred Apley's scratch test (touch opposite shoulder):  Action 1 (Across chest): deferred Action 2 (Overhead): deferred Action 3 (LB reach): deferred    Thoracic Spine Area Exam  Skin & Axial Inspection: No masses, redness, or swelling Alignment: Symmetrical Functional ROM: Unrestricted ROM Stability: No instability detected Muscle Tone/Strength: Functionally intact. No obvious neuro-muscular anomalies detected. Sensory (Neurological): Unimpaired Muscle strength & Tone: No palpable anomalies  Lumbar Spine Area Exam  Skin & Axial Inspection: No masses, redness, or swelling Alignment: Symmetrical Functional ROM: Decreased ROM       Stability: No instability detected Muscle Tone/Strength: Functionally intact. No obvious neuro-muscular anomalies detected. Sensory (Neurological): Unimpaired Palpation: No palpable anomalies       Provocative Tests: Hyperextension/rotation test: deferred today       Lumbar quadrant test (Kemp's test): deferred today       Lateral bending test: deferred today       Patrick's Maneuver: deferred today                   FABER test: deferred today                   S-I anterior distraction/compression test: deferred today         S-I lateral compression test: deferred today         S-I Thigh-thrust test: deferred today         S-I Gaenslen's test: deferred today          Gait & Posture Assessment  Ambulation: Unassisted Gait: Relatively normal for age and body habitus Posture: WNL   Lower Extremity Exam    Side: Right lower extremity  Side: Left  lower extremity  Stability: No instability observed          Stability: No instability observed          Skin & Extremity Inspection: Skin color, temperature, and hair growth are WNL. No peripheral edema or cyanosis. No masses, redness, swelling, asymmetry, or associated skin lesions. No contractures.  Skin & Extremity Inspection: Skin color, temperature, and hair growth are WNL. No peripheral edema or cyanosis. No masses, redness, swelling, asymmetry, or associated skin lesions. No contractures.  Functional ROM: Unrestricted ROM  Functional ROM: Unrestricted ROM                  Muscle Tone/Strength: Functionally intact. No obvious neuro-muscular anomalies detected.  Muscle Tone/Strength: Functionally intact. No obvious neuro-muscular anomalies detected.  Sensory (Neurological): Unimpaired  Sensory (Neurological): Unimpaired  Palpation: No palpable anomalies  Palpation: No palpable anomalies   Assessment  Primary Diagnosis & Pertinent Problem List: The primary encounter diagnosis was Cervical spondylosis without myelopathy. Diagnoses of Cervical facet syndrome and Chronic pain syndrome were also pertinent to this visit.  Status Diagnosis  Persistent Persistent Persistent 1. Cervical spondylosis without myelopathy   2. Cervical facet syndrome   3. Chronic pain syndrome     General Recommendations: The pain condition that the patient suffers from is best treated with a multidisciplinary approach that involves an increase in physical activity to prevent de-conditioning and worsening of the pain cycle, as well as psychological counseling (formal and/or informal) to address the co-morbid psychological affects of pain. Treatment will often involve judicious use of pain medications and interventional procedures to decrease the pain, allowing the patient to participate in the physical activity that will ultimately produce long-lasting pain reductions. The goal of the multidisciplinary  approach is to return the patient to a higher level of overall function and to restore their ability to perform activities of daily living.  67 year old female who complains of axial neck pain follows up status post bilateral C4, C5, C6, C7 cervical facet medial branch nerve blocks.  Patient endorses significant benefit in regards to her bilateral neck pain for the first 3 weeks but over the last week has noted return of right neck, shoulder, periscapular pain.  She states that her left neck is still doing well.  She is scheduled for a knee replacement surgery later this month.  We discussed repeating right cervical facet medial branch nerve block after her knee surgery.  Risks and benefits were discussed and patient would like to proceed.  We will also refill the patient's hydrocodone and tizanidine as below.  [place] PMP checked and appropriate.  Plan: -Refill hydrocodone and tizanidine as below -Repeat R C4,5,6,7 cervical facet medial branch nerve block  Plan of Care  Pharmacotherapy (Medications Ordered): Meds ordered this encounter  Medications  . DISCONTD: HYDROcodone-acetaminophen (NORCO) 7.5-325 MG tablet    Sig: Take 1 tablet by mouth 3 (three) times daily as needed for severe pain. For Chronic Pain DNF: 12/29/2017, 01/27/2018, 02/26/2018    Dispense:  90 tablet    Refill:  0  . DISCONTD: HYDROcodone-acetaminophen (NORCO) 7.5-325 MG tablet    Sig: Take 1 tablet by mouth 3 (three) times daily as needed for severe pain. For Chronic Pain DNF: 12/29/2017, 01/27/2018, 02/26/2018    Dispense:  90 tablet    Refill:  0  . HYDROcodone-acetaminophen (NORCO) 7.5-325 MG tablet    Sig: Take 1 tablet by mouth 3 (three) times daily as needed for severe pain. For Chronic Pain DNF: 12/29/2017, 01/27/2018, 02/26/2018    Dispense:  90 tablet    Refill:  0  . tiZANidine (ZANAFLEX) 4 MG capsule    Sig: Take 1 capsule (4 mg total) by mouth 2 (two) times daily as needed.    Dispense:  60 capsule    Refill:  2    Lab-work, procedure(s), and/or referral(s): Orders Placed This Encounter  Procedures  . CERVICAL FACET (MEDIAL BRANCH NERVE BLOCK)    Time Note: Greater than 50% of the 25 minute(s) of face-to-face time spent with Ms. Demmon,  was spent in counseling/coordination of care regarding: Ms. Wight's primary cause of pain, the treatment plan, treatment alternatives, the risks and possible complications of proposed treatment, going over the informed consent, the opioid analgesic risks and possible complications, the results, interpretation and significance of  her recent diagnostic interventional treatment(s), the appropriate use of her medications, realistic expectations, the goals of pain management (increased in functionality), the medication agreement and the patient's responsibilities when it comes to controlled substances.  Provider-requested follow-up: Return in about 6 weeks (around 02/09/2018) for Procedure.  Future Appointments  Date Time Provider [place]  03/01/2018 10:00 AM [place] ADVISOR [facility] None    Primary Care Physician: Juline Patch, MD Location: [facility] Note by: Gillis Santa, M.D Date: 12/29/2017; Time: 2:11 PM  Patient Instructions   You have been given 3 Rx for hydrocodone with acetaminophen to last until 03/28/2018.  Tizanidine has been escribed to your pharmacy.   Moderate Conscious Sedation, Adult Sedation is the use of medicines to promote relaxation and relieve discomfort and anxiety. Moderate conscious sedation is a type of sedation. Under moderate conscious sedation, you are less alert than normal, but you are still able to respond to instructions, touch, or both. Moderate conscious sedation is used during short medical and dental procedures. It is milder than deep sedation, which is a type of sedation under which you cannot be easily woken up. It is also milder than general anesthesia, which is the use of medicines  to make you unconscious. Moderate conscious sedation allows you to return to your regular activities sooner. Tell a health care provider about:  Any allergies you have.  All medicines you are taking, including vitamins, herbs, eye drops, creams, and over-the-counter medicines.  Use of steroids (by mouth or creams).  Any problems you or family members have had with sedatives and anesthetic medicines.  Any blood disorders you have.  Any surgeries you have had.  Any medical conditions you have, such as sleep apnea.  Whether you are pregnant or may be pregnant.  Any use of cigarettes, alcohol, marijuana, or street drugs. What are the risks? Generally, this is a safe procedure. However, problems may occur, including:  Getting too much medicine (oversedation).  Nausea.  Allergic reaction to medicines.  Trouble breathing. If this happens, a breathing tube may be used to help with breathing. It will be removed when you are awake and breathing on your own.  Heart trouble.  Lung trouble.  What happens before the procedure? Staying hydrated Follow instructions from your health care provider about hydration, which may include:  Up to 2 hours before the procedure - you may continue to drink clear liquids, such as water, clear fruit juice, black coffee, and plain tea.  Eating and drinking restrictions Follow instructions from your health care provider about eating and drinking, which may include:  8 hours before the procedure - stop eating heavy meals or foods such as meat, fried foods, or fatty foods.  6 hours before the procedure - stop eating light meals or foods, such as toast or cereal.  6 hours before the procedure - stop drinking milk or drinks that contain milk.  2 hours before the procedure - stop drinking clear liquids.  Medicine  Ask your health care provider about:  Changing or stopping your regular medicines. This is especially important if you are taking  diabetes medicines or blood thinners.  Taking medicines such as aspirin and ibuprofen. These medicines can thin your  blood. Do not take these medicines before your procedure if your health care provider instructs you not to.  Tests and exams  You will have a physical exam.  You may have blood tests done to show: ? How well your kidneys and liver are working. ? How well your blood can clot. General instructions  Plan to have someone take you home from the hospital or clinic.  If you will be going home right after the procedure, plan to have someone with you for 24 hours. What happens during the procedure?  An IV tube will be inserted into one of your veins.  Medicine to help you relax (sedative) will be given through the IV tube.  The medical or dental procedure will be performed. What happens after the procedure?  Your blood pressure, heart rate, breathing rate, and blood oxygen level will be monitored often until the medicines you were given have worn off.  Do not drive for 24 hours. This information is not intended to replace advice given to you by your health care provider. Make sure you discuss any questions you have with your health care provider. Document Released: 11/05/2000 Document Revised: 07/17/2015 Document Reviewed: 06/02/2015 Elsevier Interactive Patient Education  2018 [place]  What are the risk, side effects and possible complications? Generally speaking, most procedures are safe.  However, with any procedure there are risks, side effects, and the possibility of complications.  The risks and complications are dependent upon the sites that are lesioned, or the type of nerve block to be performed.  The closer the procedure is to the spine, the more serious the risks are.  Great care is taken when placing the radio frequency needles, block needles or lesioning probes, but sometimes complications can occur. 1. Infection: Any time  there is an injection through the skin, there is a risk of infection.  This is why sterile conditions are used for these blocks.  There are four possible types of infection. 1. Localized skin infection. 2. Central Nervous System Infection-This can be in the form of Meningitis, which can be deadly. 3. Epidural Infections-This can be in the form of an epidural abscess, which can cause pressure inside of the spine, causing compression of the spinal cord with subsequent paralysis. This would require an emergency surgery to decompress, and there are no guarantees that the patient would recover from the paralysis. 4. Discitis-This is an infection of the intervertebral discs.  It occurs in about 1% of discography procedures.  It is difficult to treat and it may lead to surgery.        2. Pain: the needles have to go through skin and soft tissues, will cause soreness.       3. Damage to internal structures:  The nerves to be lesioned may be near blood vessels or    other nerves which can be potentially damaged.       4. Bleeding: Bleeding is more common if the patient is taking blood thinners such as  aspirin, Coumadin, Ticiid, Plavix, etc., or if he/she have some genetic predisposition  such as hemophilia. Bleeding into the spinal canal can cause compression of the spinal  cord with subsequent paralysis.  This would require an emergency surgery to  decompress and there are no guarantees that the patient would recover from the  paralysis.       5. Pneumothorax:  Puncturing of a lung is a possibility, every time a needle is introduced in  the area of the chest or upper back.  Pneumothorax refers to free air around the  collapsed lung(s), inside of the thoracic cavity (chest cavity).  Another two possible  complications related to a similar event would include: Hemothorax and Chylothorax.   These are variations of the Pneumothorax, where instead of air around the collapsed  lung(s), you may have blood or chyle,  respectively.       6. Spinal headaches: They may occur with any procedures in the area of the spine.       7. Persistent CSF (Cerebro-Spinal Fluid) leakage: This is a rare problem, but may occur  with prolonged intrathecal or epidural catheters either due to the formation of a fistulous  track or a dural tear.       8. Nerve damage: By working so close to the spinal cord, there is always a possibility of  nerve damage, which could be as serious as a permanent spinal cord injury with  paralysis.       9. Death:  Although rare, severe deadly allergic reactions known as "Anaphylactic  reaction" can occur to any of the medications used.      10. Worsening of the symptoms:  We can always make thing worse.  What are the chances of something like this happening? Chances of any of this occuring are extremely low.  By statistics, you have more of a chance of getting killed in a motor vehicle accident: while driving to the hospital than any of the above occurring .  Nevertheless, you should be aware that they are possibilities.  In general, it is similar to taking a shower.  Everybody knows that you can slip, hit your head and get killed.  Does that mean that you should not shower again?  Nevertheless always keep in mind that statistics do not mean anything if you happen to be on the wrong side of them.  Even if a procedure has a 1 (one) in a 1,000,000 (million) chance of going wrong, it you happen to be that one..Also, keep in mind that by statistics, you have more of a chance of having something go wrong when taking medications.  Who should not have this procedure? If you are on a blood thinning medication (e.g. Coumadin, Plavix, see list of "Blood Thinners"), or if you have an active infection going on, you should not have the procedure.  If you are taking any blood thinners, please inform your physician.  How should I prepare for this procedure?  Do not eat or drink anything at least six hours prior to the  procedure.  Bring a driver with you .  It cannot be a taxi.  Come accompanied by an adult that can drive you back, and that is strong enough to help you if your legs get weak or numb from the local anesthetic.  Take all of your medicines the morning of the procedure with just enough water to swallow them.  If you have diabetes, make sure that you are scheduled to have your procedure done first thing in the morning, whenever possible.  If you have diabetes, take only half of your insulin dose and notify our nurse that you have done so as soon as you arrive at the clinic.  If you are diabetic, but only take blood sugar pills (oral hypoglycemic), then do not take them on the morning of your procedure.  You may take them after you have had the procedure.  Do not take aspirin or  any aspirin-containing medications, at least eleven (11) days prior to the procedure.  They may prolong bleeding.  Wear loose fitting clothing that may be easy to take off and that you would not mind if it got stained with Betadine or blood.  Do not wear any jewelry or perfume  Remove any nail coloring.  It will interfere with some of our monitoring equipment.  NOTE: Remember that this is not meant to be interpreted as a complete list of all possible complications.  Unforeseen problems may occur.  BLOOD THINNERS The following drugs contain aspirin or other products, which can cause increased bleeding during surgery and should not be taken for 2 weeks prior to and 1 week after surgery.  If you should need take something for relief of minor pain, you may take acetaminophen which is found in Tylenol,m Datril, Anacin-3 and Panadol. It is not blood thinner. The products listed below are.  Do not take any of the products listed below in addition to any listed on your instruction sheet.  A.P.C or A.P.C with Codeine Codeine Phosphate Capsules #3 Ibuprofen Ridaura  ABC compound Congesprin Imuran rimadil  Advil Cope Indocin  Robaxisal  Alka-Seltzer Effervescent Pain Reliever and Antacid Coricidin or Coricidin-D  Indomethacin Rufen  Alka-Seltzer plus Cold Medicine Cosprin Ketoprofen S-A-C Tablets  Anacin Analgesic Tablets or Capsules Coumadin Korlgesic Salflex  Anacin Extra Strength Analgesic tablets or capsules CP-2 Tablets Lanoril Salicylate  Anaprox Cuprimine Capsules Levenox Salocol  Anexsia-D Dalteparin Magan Salsalate  Anodynos Darvon compound Magnesium Salicylate Sine-off  Ansaid Dasin Capsules Magsal Sodium Salicylate  Anturane Depen Capsules Marnal Soma  APF Arthritis pain formula Dewitt's Pills Measurin Stanback  Argesic Dia-Gesic Meclofenamic Sulfinpyrazone  Arthritis Bayer Timed Release Aspirin Diclofenac Meclomen Sulindac  Arthritis pain formula Anacin Dicumarol Medipren Supac  Analgesic (Safety coated) Arthralgen Diffunasal Mefanamic Suprofen  Arthritis Strength Bufferin Dihydrocodeine Mepro Compound Suprol  Arthropan liquid Dopirydamole Methcarbomol with Aspirin Synalgos  ASA tablets/Enseals Disalcid Micrainin Tagament  Ascriptin Doan's Midol Talwin  Ascriptin A/D Dolene Mobidin Tanderil  Ascriptin Extra Strength Dolobid Moblgesic Ticlid  Ascriptin with Codeine Doloprin or Doloprin with Codeine Momentum Tolectin  Asperbuf Duoprin Mono-gesic Trendar  Aspergum Duradyne Motrin or Motrin IB Triminicin  Aspirin plain, buffered or enteric coated Durasal Myochrisine Trigesic  Aspirin Suppositories Easprin Nalfon Trillsate  Aspirin with Codeine Ecotrin Regular or Extra Strength Naprosyn Uracel  Atromid-S Efficin Naproxen Ursinus  Auranofin Capsules Elmiron Neocylate Vanquish  Axotal Emagrin Norgesic Verin  Azathioprine Empirin or Empirin with Codeine Normiflo Vitamin E  Azolid Emprazil Nuprin Voltaren  Bayer Aspirin plain, buffered or children's or timed BC Tablets or powders Encaprin Orgaran Warfarin Sodium  Buff-a-Comp Enoxaparin Orudis Zorpin  Buff-a-Comp with Codeine Equegesic Os-Cal-Gesic    Buffaprin Excedrin plain, buffered or Extra Strength Oxalid   Bufferin Arthritis Strength Feldene Oxphenbutazone   Bufferin plain or Extra Strength Feldene Capsules Oxycodone with Aspirin   Bufferin with Codeine Fenoprofen Fenoprofen Pabalate or Pabalate-SF   Buffets II Flogesic Panagesic   Buffinol plain or Extra Strength Florinal or Florinal with Codeine Panwarfarin   Buf-Tabs Flurbiprofen Penicillamine   Butalbital Compound Four-way cold tablets Penicillin   Butazolidin Fragmin Pepto-Bismol   Carbenicillin Geminisyn Percodan   Carna Arthritis Reliever Geopen Persantine   Carprofen Gold's salt Persistin   Chloramphenicol Goody's Phenylbutazone   Chloromycetin Haltrain Piroxlcam   Clmetidine heparin Plaquenil   Cllnoril Hyco-pap Ponstel   Clofibrate Hydroxy chloroquine Propoxyphen         Before stopping any of these medications, be  sure to consult the physician who ordered them.  Some, such as Coumadin (Warfarin) are ordered to prevent or treat serious conditions such as "deep thrombosis", "pumonary embolisms", and other heart problems.  The amount of time that you may need off of the medication may also vary with the medication and the reason for which you were taking it.  If you are taking any of these medications, please make sure you notify your pain physician before you undergo any procedures.  Facet Blocks Patient Information  Description: The facets are joints in the spine between the vertebrae.  Like any joints in the body, facets can become irritated and painful.  Arthritis can also effect the facets.  By injecting steroids and local anesthetic in and around these joints, we can temporarily block the nerve supply to them.  Steroids act directly on irritated nerves and tissues to reduce selling and inflammation which often leads to decreased pain.  Facet blocks may be done anywhere along the spine from the neck to the low back depending upon the location of your pain.   After  numbing the skin with local anesthetic (like Novocaine), a small needle is passed onto the facet joints under x-ray guidance.  You may experience a sensation of pressure while this is being done.  The entire block usually lasts about 15-25 minutes.   Conditions which may be treated by facet blocks:   Low back/buttock pain  Neck/shoulder pain  Certain types of headaches  Preparation for the injection:  1. Do not eat any solid food or dairy products within 8 hours of your appointment. 2. You may drink clear liquid up to 3 hours before appointment.  Clear liquids include water, black coffee, juice or soda.  No milk or cream please. 3. You may take your regular medication, including pain medications, with a sip of water before your appointment.  Diabetics should hold regular insulin (if taken separately) and take 1/2 normal NPH dose the morning of the procedure.  Carry some sugar containing items with you to your appointment. 4. A driver must accompany you and be prepared to drive you home after your procedure. 5. Bring all your current medications with you. 6. An IV may be inserted and sedation may be given at the discretion of the physician. 7. A blood pressure cuff, EKG and other monitors will often be applied during the procedure.  Some patients may need to have extra oxygen administered for a short period. 8. You will be asked to provide medical information, including your allergies and medications, prior to the procedure.  We must know immediately if you are taking blood thinners (like Coumadin/Warfarin) or if you are allergic to IV iodine contrast (dye).  We must know if you could possible be pregnant.  Possible side-effects:   Bleeding from needle site  Infection (rare, may require surgery)  Nerve injury (rare)  Numbness & tingling (temporary)  Difficulty urinating (rare, temporary)  Spinal headache (a headache worse with upright posture)  Light-headedness (temporary)  Pain  at injection site (serveral days)  Decreased blood pressure (rare, temporary)  Weakness in arm/leg (temporary)  Pressure sensation in back/neck (temporary)   Call if you experience:   Fever/chills associated with headache or increased back/neck pain  Headache worsened by an upright position  New onset, weakness or numbness of an extremity below the injection site  Hives or difficulty breathing (go to the emergency room)  Inflammation or drainage at the injection site(s)  Severe back/neck pain greater than  usual  New symptoms which are concerning to you  Please note:  Although the local anesthetic injected can often make your back or neck feel good for several hours after the injection, the pain will likely return. It takes 3-7 days for steroids to work.  You may not notice any pain relief for at least one week.  If effective, we will often do a series of 2-3 injections spaced 3-6 weeks apart to maximally decrease your pain.  After the initial series, you may be a candidate for a more permanent nerve block of the facets.  If you have any questions, please call #[phone]) [facility]

## 2017-12-29 NOTE — Patient Instructions (Addendum)
You have been given 3 Rx for hydrocodone with acetaminophen to last until 03/28/2018.  Tizanidine has been escribed to your pharmacy.   Moderate Conscious Sedation, Adult Sedation is the use of medicines to promote relaxation and relieve discomfort and anxiety. Moderate conscious sedation is a type of sedation. Under moderate conscious sedation, you are less alert than normal, but you are still able to respond to instructions, touch, or both. Moderate conscious sedation is used during short medical and dental procedures. It is milder than deep sedation, which is a type of sedation under which you cannot be easily woken up. It is also milder than general anesthesia, which is the use of medicines to make you unconscious. Moderate conscious sedation allows you to return to your regular activities sooner. Tell a health care provider about:  Any allergies you have.  All medicines you are taking, including vitamins, herbs, eye drops, creams, and over-the-counter medicines.  Use of steroids (by mouth or creams).  Any problems you or family members have had with sedatives and anesthetic medicines.  Any blood disorders you have.  Any surgeries you have had.  Any medical conditions you have, such as sleep apnea.  Whether you are pregnant or may be pregnant.  Any use of cigarettes, alcohol, marijuana, or street drugs. What are the risks? Generally, this is a safe procedure. However, problems may occur, including:  Getting too much medicine (oversedation).  Nausea.  Allergic reaction to medicines.  Trouble breathing. If this happens, a breathing tube may be used to help with breathing. It will be removed when you are awake and breathing on your own.  Heart trouble.  Lung trouble.  What happens before the procedure? Staying hydrated Follow instructions from your health care provider about hydration, which may include:  Up to 2 hours before the procedure - you may continue to drink  clear liquids, such as water, clear fruit juice, black coffee, and plain tea.  Eating and drinking restrictions Follow instructions from your health care provider about eating and drinking, which may include:  8 hours before the procedure - stop eating heavy meals or foods such as meat, fried foods, or fatty foods.  6 hours before the procedure - stop eating light meals or foods, such as toast or cereal.  6 hours before the procedure - stop drinking milk or drinks that contain milk.  2 hours before the procedure - stop drinking clear liquids.  Medicine  Ask your health care provider about:  Changing or stopping your regular medicines. This is especially important if you are taking diabetes medicines or blood thinners.  Taking medicines such as aspirin and ibuprofen. These medicines can thin your blood. Do not take these medicines before your procedure if your health care provider instructs you not to.  Tests and exams  You will have a physical exam.  You may have blood tests done to show: ? How well your kidneys and liver are working. ? How well your blood can clot. General instructions  Plan to have someone take you home from the hospital or clinic.  If you will be going home right after the procedure, plan to have someone with you for 24 hours. What happens during the procedure?  An IV tube will be inserted into one of your veins.  Medicine to help you relax (sedative) will be given through the IV tube.  The medical or dental procedure will be performed. What happens after the procedure?  Your blood pressure, heart rate, breathing rate,  and blood oxygen level will be monitored often until the medicines you were given have worn off.  Do not drive for 24 hours. This information is not intended to replace advice given to you by your health care provider. Make sure you discuss any questions you have with your health care provider. Document Released: 11/05/2000 Document  Revised: 07/17/2015 Document Reviewed: 06/02/2015 Elsevier Interactive Patient Education  2018 [employer]  What are the risk, side effects and possible complications? Generally speaking, most procedures are safe.  However, with any procedure there are risks, side effects, and the possibility of complications.  The risks and complications are dependent upon the sites that are lesioned, or the type of nerve block to be performed.  The closer the procedure is to the spine, the more serious the risks are.  Great care is taken when placing the radio frequency needles, block needles or lesioning probes, but sometimes complications can occur. 1. Infection: Any time there is an injection through the skin, there is a risk of infection.  This is why sterile conditions are used for these blocks.  There are four possible types of infection. 1. Localized skin infection. 2. Central Nervous System Infection-This can be in the form of Meningitis, which can be deadly. 3. Epidural Infections-This can be in the form of an epidural abscess, which can cause pressure inside of the spine, causing compression of the spinal cord with subsequent paralysis. This would require an emergency surgery to decompress, and there are no guarantees that the patient would recover from the paralysis. 4. Discitis-This is an infection of the intervertebral discs.  It occurs in about 1% of discography procedures.  It is difficult to treat and it may lead to surgery.        2. Pain: the needles have to go through skin and soft tissues, will cause soreness.       3. Damage to internal structures:  The nerves to be lesioned may be near blood vessels or    other nerves which can be potentially damaged.       4. Bleeding: Bleeding is more common if the patient is taking blood thinners such as  aspirin, Coumadin, Ticiid, Plavix, etc., or if he/she have some genetic predisposition  such as hemophilia. Bleeding  into the spinal canal can cause compression of the spinal  cord with subsequent paralysis.  This would require an emergency surgery to  decompress and there are no guarantees that the patient would recover from the  paralysis.       5. Pneumothorax:  Puncturing of a lung is a possibility, every time a needle is introduced in  the area of the chest or upper back.  Pneumothorax refers to free air around the  collapsed lung(s), inside of the thoracic cavity (chest cavity).  Another two possible  complications related to a similar event would include: Hemothorax and Chylothorax.   These are variations of the Pneumothorax, where instead of air around the collapsed  lung(s), you may have blood or chyle, respectively.       6. Spinal headaches: They may occur with any procedures in the area of the spine.       7. Persistent CSF (Cerebro-Spinal Fluid) leakage: This is a rare problem, but may occur  with prolonged intrathecal or epidural catheters either due to the formation of a fistulous  track or a dural tear.       8. Nerve damage: By working so close  to the spinal cord, there is always a possibility of  nerve damage, which could be as serious as a permanent spinal cord injury with  paralysis.       9. Death:  Although rare, severe deadly allergic reactions known as "Anaphylactic  reaction" can occur to any of the medications used.      10. Worsening of the symptoms:  We can always make thing worse.  What are the chances of something like this happening? Chances of any of this occuring are extremely low.  By statistics, you have more of a chance of getting killed in a motor vehicle accident: while driving to the hospital than any of the above occurring .  Nevertheless, you should be aware that they are possibilities.  In general, it is similar to taking a shower.  Everybody knows that you can slip, hit your head and get killed.  Does that mean that you should not shower again?  Nevertheless always keep in mind  that statistics do not mean anything if you happen to be on the wrong side of them.  Even if a procedure has a 1 (one) in a 1,000,000 (million) chance of going wrong, it you happen to be that one..Also, keep in mind that by statistics, you have more of a chance of having something go wrong when taking medications.  Who should not have this procedure? If you are on a blood thinning medication (e.g. Coumadin, Plavix, see list of "Blood Thinners"), or if you have an active infection going on, you should not have the procedure.  If you are taking any blood thinners, please inform your physician.  How should I prepare for this procedure?  Do not eat or drink anything at least six hours prior to the procedure.  Bring a driver with you .  It cannot be a taxi.  Come accompanied by an adult that can drive you back, and that is strong enough to help you if your legs get weak or numb from the local anesthetic.  Take all of your medicines the morning of the procedure with just enough water to swallow them.  If you have diabetes, make sure that you are scheduled to have your procedure done first thing in the morning, whenever possible.  If you have diabetes, take only half of your insulin dose and notify our nurse that you have done so as soon as you arrive at the clinic.  If you are diabetic, but only take blood sugar pills (oral hypoglycemic), then do not take them on the morning of your procedure.  You may take them after you have had the procedure.  Do not take aspirin or any aspirin-containing medications, at least eleven (11) days prior to the procedure.  They may prolong bleeding.  Wear loose fitting clothing that may be easy to take off and that you would not mind if it got stained with Betadine or blood.  Do not wear any jewelry or perfume  Remove any nail coloring.  It will interfere with some of our monitoring equipment.  NOTE: Remember that this is not meant to be interpreted as a  complete list of all possible complications.  Unforeseen problems may occur.  BLOOD THINNERS The following drugs contain aspirin or other products, which can cause increased bleeding during surgery and should not be taken for 2 weeks prior to and 1 week after surgery.  If you should need take something for relief of minor pain, you may take acetaminophen which is  found in Tylenol,m Datril, Anacin-3 and Panadol. It is not blood thinner. The products listed below are.  Do not take any of the products listed below in addition to any listed on your instruction sheet.  A.P.C or A.P.C with Codeine Codeine Phosphate Capsules #3 Ibuprofen Ridaura  ABC compound Congesprin Imuran rimadil  Advil Cope Indocin Robaxisal  Alka-Seltzer Effervescent Pain Reliever and Antacid Coricidin or Coricidin-D  Indomethacin Rufen  Alka-Seltzer plus Cold Medicine Cosprin Ketoprofen S-A-C Tablets  Anacin Analgesic Tablets or Capsules Coumadin Korlgesic Salflex  Anacin Extra Strength Analgesic tablets or capsules CP-2 Tablets Lanoril Salicylate  Anaprox Cuprimine Capsules Levenox Salocol  Anexsia-D Dalteparin Magan Salsalate  Anodynos Darvon compound Magnesium Salicylate Sine-off  Ansaid Dasin Capsules Magsal Sodium Salicylate  Anturane Depen Capsules Marnal Soma  APF Arthritis pain formula Dewitt's Pills Measurin Stanback  Argesic Dia-Gesic Meclofenamic Sulfinpyrazone  Arthritis Bayer Timed Release Aspirin Diclofenac Meclomen Sulindac  Arthritis pain formula Anacin Dicumarol Medipren Supac  Analgesic (Safety coated) Arthralgen Diffunasal Mefanamic Suprofen  Arthritis Strength Bufferin Dihydrocodeine Mepro Compound Suprol  Arthropan liquid Dopirydamole Methcarbomol with Aspirin Synalgos  ASA tablets/Enseals Disalcid Micrainin Tagament  Ascriptin Doan's Midol Talwin  Ascriptin A/D Dolene Mobidin Tanderil  Ascriptin Extra Strength Dolobid Moblgesic Ticlid  Ascriptin with Codeine Doloprin or Doloprin with Codeine  Momentum Tolectin  Asperbuf Duoprin Mono-gesic Trendar  Aspergum Duradyne Motrin or Motrin IB Triminicin  Aspirin plain, buffered or enteric coated Durasal Myochrisine Trigesic  Aspirin Suppositories Easprin Nalfon Trillsate  Aspirin with Codeine Ecotrin Regular or Extra Strength Naprosyn Uracel  Atromid-S Efficin Naproxen Ursinus  Auranofin Capsules Elmiron Neocylate Vanquish  Axotal Emagrin Norgesic Verin  Azathioprine Empirin or Empirin with Codeine Normiflo Vitamin E  Azolid Emprazil Nuprin Voltaren  Bayer Aspirin plain, buffered or children's or timed BC Tablets or powders Encaprin Orgaran Warfarin Sodium  Buff-a-Comp Enoxaparin Orudis Zorpin  Buff-a-Comp with Codeine Equegesic Os-Cal-Gesic   Buffaprin Excedrin plain, buffered or Extra Strength Oxalid   Bufferin Arthritis Strength Feldene Oxphenbutazone   Bufferin plain or Extra Strength Feldene Capsules Oxycodone with Aspirin   Bufferin with Codeine Fenoprofen Fenoprofen Pabalate or Pabalate-SF   Buffets II Flogesic Panagesic   Buffinol plain or Extra Strength Florinal or Florinal with Codeine Panwarfarin   Buf-Tabs Flurbiprofen Penicillamine   Butalbital Compound Four-way cold tablets Penicillin   Butazolidin Fragmin Pepto-Bismol   Carbenicillin Geminisyn Percodan   Carna Arthritis Reliever Geopen Persantine   Carprofen Gold's salt Persistin   Chloramphenicol Goody's Phenylbutazone   Chloromycetin Haltrain Piroxlcam   Clmetidine heparin Plaquenil   Cllnoril Hyco-pap Ponstel   Clofibrate Hydroxy chloroquine Propoxyphen         Before stopping any of these medications, be sure to consult the physician who ordered them.  Some, such as Coumadin (Warfarin) are ordered to prevent or treat serious conditions such as "deep thrombosis", "pumonary embolisms", and other heart problems.  The amount of time that you may need off of the medication may also vary with the medication and the reason for which you were taking it.  If you are  taking any of these medications, please make sure you notify your pain physician before you undergo any procedures.  Facet Blocks Patient Information  Description: The facets are joints in the spine between the vertebrae.  Like any joints in the body, facets can become irritated and painful.  Arthritis can also effect the facets.  By injecting steroids and local anesthetic in and around these joints, we can temporarily block the nerve supply to them.  Steroids act directly on irritated nerves and tissues to reduce selling and inflammation which often leads to decreased pain.  Facet blocks may be done anywhere along the spine from the neck to the low back depending upon the location of your pain.   After numbing the skin with local anesthetic (like Novocaine), a small needle is passed onto the facet joints under x-ray guidance.  You may experience a sensation of pressure while this is being done.  The entire block usually lasts about 15-25 minutes.   Conditions which may be treated by facet blocks:   Low back/buttock pain  Neck/shoulder pain  Certain types of headaches  Preparation for the injection:  1. Do not eat any solid food or dairy products within 8 hours of your appointment. 2. You may drink clear liquid up to 3 hours before appointment.  Clear liquids include water, black coffee, juice or soda.  No milk or cream please. 3. You may take your regular medication, including pain medications, with a sip of water before your appointment.  Diabetics should hold regular insulin (if taken separately) and take 1/2 normal NPH dose the morning of the procedure.  Carry some sugar containing items with you to your appointment. 4. A driver must accompany you and be prepared to drive you home after your procedure. 5. Bring all your current medications with you. 6. An IV may be inserted and sedation may be given at the discretion of the physician. 7. A blood pressure cuff, EKG and other monitors will  often be applied during the procedure.  Some patients may need to have extra oxygen administered for a short period. 8. You will be asked to provide medical information, including your allergies and medications, prior to the procedure.  We must know immediately if you are taking blood thinners (like Coumadin/Warfarin) or if you are allergic to IV iodine contrast (dye).  We must know if you could possible be pregnant.  Possible side-effects:   Bleeding from needle site  Infection (rare, may require surgery)  Nerve injury (rare)  Numbness & tingling (temporary)  Difficulty urinating (rare, temporary)  Spinal headache (a headache worse with upright posture)  Light-headedness (temporary)  Pain at injection site (serveral days)  Decreased blood pressure (rare, temporary)  Weakness in arm/leg (temporary)  Pressure sensation in back/neck (temporary)   Call if you experience:   Fever/chills associated with headache or increased back/neck pain  Headache worsened by an upright position  New onset, weakness or numbness of an extremity below the injection site  Hives or difficulty breathing (go to the emergency room)  Inflammation or drainage at the injection site(s)  Severe back/neck pain greater than usual  New symptoms which are concerning to you  Please note:  Although the local anesthetic injected can often make your back or neck feel good for several hours after the injection, the pain will likely return. It takes 3-7 days for steroids to work.  You may not notice any pain relief for at least one week.  If effective, we will often do a series of 2-3 injections spaced 3-6 weeks apart to maximally decrease your pain.  After the initial series, you may be a candidate for a more permanent nerve block of the facets.  If you have any questions, please call #[phone]) [facility]

## 2018-01-03 ENCOUNTER — Other Ambulatory Visit: Payer: Self-pay | Admitting: Family Medicine

## 2018-01-03 DIAGNOSIS — I10 Essential (primary) hypertension: Secondary | ICD-10-CM

## 2018-01-06 DIAGNOSIS — G8929 Other chronic pain: Secondary | ICD-10-CM | POA: Diagnosis not present

## 2018-01-06 DIAGNOSIS — M25561 Pain in right knee: Secondary | ICD-10-CM | POA: Diagnosis not present

## 2018-01-12 ENCOUNTER — Other Ambulatory Visit: Payer: Self-pay | Admitting: Student in an Organized Health Care Education/Training Program

## 2018-01-12 ENCOUNTER — Other Ambulatory Visit: Payer: Self-pay | Admitting: Family Medicine

## 2018-01-12 DIAGNOSIS — Z6835 Body mass index (BMI) 35.0-35.9, adult: Principal | ICD-10-CM

## 2018-01-12 DIAGNOSIS — E785 Hyperlipidemia, unspecified: Secondary | ICD-10-CM

## 2018-01-13 DIAGNOSIS — L400 Psoriasis vulgaris: Secondary | ICD-10-CM | POA: Diagnosis not present

## 2018-01-13 DIAGNOSIS — B351 Tinea unguium: Secondary | ICD-10-CM | POA: Diagnosis not present

## 2018-01-13 DIAGNOSIS — B353 Tinea pedis: Secondary | ICD-10-CM | POA: Diagnosis not present

## 2018-01-15 NOTE — Pre-Procedure Instructions (Signed)
Call patient to review surgery instructions.  Left message to return the call.

## 2018-01-15 NOTE — Pre-Procedure Instructions (Signed)
Spoke with Tiffany at [facility] ortho and she is aware of need for new H&P for surgery on 11/27

## 2018-01-19 MED ORDER — TRANEXAMIC ACID-NACL 1000-0.7 MG/100ML-% IV SOLN
1000.0000 mg | INTRAVENOUS | Status: AC
  Administered 2018-01-20: 1000 mg via INTRAVENOUS
  Filled 2018-01-19: qty 100

## 2018-01-19 MED ORDER — CLINDAMYCIN PHOSPHATE 900 MG/50ML IV SOLN
900.0000 mg | INTRAVENOUS | Status: AC
  Administered 2018-01-20: 900 mg via INTRAVENOUS

## 2018-01-20 ENCOUNTER — Inpatient Hospital Stay
Admission: RE | Admit: 2018-01-20 | Discharge: 2018-01-23 | DRG: 489 | Disposition: A | Discharge status: Skilled Nursing Facility | Payer: Medicare Other | Attending: Orthopedic Surgery | Admitting: Orthopedic Surgery

## 2018-01-20 ENCOUNTER — Encounter: Payer: Self-pay | Admitting: Orthopedic Surgery

## 2018-01-20 ENCOUNTER — Inpatient Hospital Stay: Payer: Medicare Other

## 2018-01-20 ENCOUNTER — Inpatient Hospital Stay: Payer: Medicare Other | Admitting: Anesthesiology

## 2018-01-20 ENCOUNTER — Encounter
Admission: RE | Disposition: A | Discharge status: Skilled Nursing Facility | Payer: Self-pay | Source: Home / Self Care | Attending: Orthopedic Surgery

## 2018-01-20 ENCOUNTER — Other Ambulatory Visit: Payer: Self-pay

## 2018-01-20 DIAGNOSIS — M069 Rheumatoid arthritis, unspecified: Secondary | ICD-10-CM | POA: Diagnosis not present

## 2018-01-20 DIAGNOSIS — E039 Hypothyroidism, unspecified: Secondary | ICD-10-CM | POA: Diagnosis present

## 2018-01-20 DIAGNOSIS — T8484XA Pain due to internal orthopedic prosthetic devices, implants and grafts, initial encounter: Principal | ICD-10-CM | POA: Diagnosis present

## 2018-01-20 DIAGNOSIS — R278 Other lack of coordination: Secondary | ICD-10-CM | POA: Diagnosis not present

## 2018-01-20 DIAGNOSIS — Z8711 Personal history of peptic ulcer disease: Secondary | ICD-10-CM

## 2018-01-20 DIAGNOSIS — K219 Gastro-esophageal reflux disease without esophagitis: Secondary | ICD-10-CM | POA: Diagnosis present

## 2018-01-20 DIAGNOSIS — Z972 Presence of dental prosthetic device (complete) (partial): Secondary | ICD-10-CM | POA: Diagnosis not present

## 2018-01-20 DIAGNOSIS — Z880 Allergy status to penicillin: Secondary | ICD-10-CM

## 2018-01-20 DIAGNOSIS — Z96659 Presence of unspecified artificial knee joint: Secondary | ICD-10-CM | POA: Diagnosis present

## 2018-01-20 DIAGNOSIS — R102 Pelvic and perineal pain: Secondary | ICD-10-CM | POA: Insufficient documentation

## 2018-01-20 DIAGNOSIS — Z471 Aftercare following joint replacement surgery: Secondary | ICD-10-CM | POA: Diagnosis not present

## 2018-01-20 DIAGNOSIS — J45909 Unspecified asthma, uncomplicated: Secondary | ICD-10-CM | POA: Diagnosis present

## 2018-01-20 DIAGNOSIS — Y831 Surgical operation with implant of artificial internal device as the cause of abnormal reaction of the patient, or of later complication, without mention of misadventure at the time of the procedure: Secondary | ICD-10-CM | POA: Diagnosis present

## 2018-01-20 DIAGNOSIS — M1990 Unspecified osteoarthritis, unspecified site: Secondary | ICD-10-CM | POA: Diagnosis present

## 2018-01-20 DIAGNOSIS — I129 Hypertensive chronic kidney disease with stage 1 through stage 4 chronic kidney disease, or unspecified chronic kidney disease: Secondary | ICD-10-CM | POA: Diagnosis present

## 2018-01-20 DIAGNOSIS — Z87892 Personal history of anaphylaxis: Secondary | ICD-10-CM

## 2018-01-20 DIAGNOSIS — Z96651 Presence of right artificial knee joint: Secondary | ICD-10-CM | POA: Diagnosis not present

## 2018-01-20 DIAGNOSIS — K08409 Partial loss of teeth, unspecified cause, unspecified class: Secondary | ICD-10-CM | POA: Diagnosis present

## 2018-01-20 DIAGNOSIS — E876 Hypokalemia: Secondary | ICD-10-CM | POA: Diagnosis not present

## 2018-01-20 DIAGNOSIS — Z741 Need for assistance with personal care: Secondary | ICD-10-CM | POA: Diagnosis not present

## 2018-01-20 DIAGNOSIS — R262 Difficulty in walking, not elsewhere classified: Secondary | ICD-10-CM | POA: Diagnosis not present

## 2018-01-20 DIAGNOSIS — N183 Chronic kidney disease, stage 3 (moderate): Secondary | ICD-10-CM | POA: Diagnosis present

## 2018-01-20 DIAGNOSIS — E559 Vitamin D deficiency, unspecified: Secondary | ICD-10-CM | POA: Diagnosis not present

## 2018-01-20 DIAGNOSIS — Z881 Allergy status to other antibiotic agents status: Secondary | ICD-10-CM

## 2018-01-20 DIAGNOSIS — G8918 Other acute postprocedural pain: Secondary | ICD-10-CM | POA: Diagnosis present

## 2018-01-20 DIAGNOSIS — M6281 Muscle weakness (generalized): Secondary | ICD-10-CM | POA: Diagnosis not present

## 2018-01-20 DIAGNOSIS — T84022A Instability of internal right knee prosthesis, initial encounter: Secondary | ICD-10-CM | POA: Diagnosis not present

## 2018-01-20 DIAGNOSIS — E782 Mixed hyperlipidemia: Secondary | ICD-10-CM | POA: Diagnosis present

## 2018-01-20 DIAGNOSIS — M25569 Pain in unspecified knee: Secondary | ICD-10-CM | POA: Diagnosis not present

## 2018-01-20 DIAGNOSIS — Z7401 Bed confinement status: Secondary | ICD-10-CM | POA: Diagnosis not present

## 2018-01-20 HISTORY — PX: TOTAL KNEE REVISION: SHX996

## 2018-01-20 LAB — POCT I-STAT 4, (NA,K, GLUC, HGB,HCT)
GLUCOSE: 100 mg/dL — AB (ref 70–99)
HEMATOCRIT: 33 % — AB (ref 36.0–46.0)
HEMOGLOBIN: 11.2 g/dL — AB (ref 12.0–15.0)
Potassium: 3.9 mmol/L (ref 3.5–5.1)
SODIUM: 140 mmol/L (ref 135–145)

## 2018-01-20 LAB — PROTIME-INR
INR: 1
Prothrombin Time: 13.1 seconds (ref 11.4–15.2)

## 2018-01-20 LAB — TYPE AND SCREEN
ABO/RH(D): O POS
ANTIBODY SCREEN: NEGATIVE

## 2018-01-20 LAB — CREATININE, SERUM
CREATININE: 1.83 mg/dL — AB (ref 0.44–1.00)
GFR calc Af Amer: 32 mL/min — ABNORMAL LOW (ref >60)
GFR calc non Af Amer: 28 mL/min — ABNORMAL LOW (ref >60)

## 2018-01-20 SURGERY — TOTAL KNEE REVISION
Anesthesia: Spinal | Site: Knee | Laterality: Right

## 2018-01-20 MED ORDER — CELECOXIB 200 MG PO CAPS
400.0000 mg | ORAL_CAPSULE | Freq: Once | ORAL | Status: AC
  Administered 2018-01-20: 400 mg via ORAL

## 2018-01-20 MED ORDER — PROPOFOL 10 MG/ML IV BOLUS
INTRAVENOUS | Status: DC | PRN
  Administered 2018-01-20: 40 mg via INTRAVENOUS

## 2018-01-20 MED ORDER — BUPIVACAINE LIPOSOME 1.3 % IJ SUSP
INTRAMUSCULAR | Status: AC
  Filled 2018-01-20: qty 20

## 2018-01-20 MED ORDER — MENTHOL 3 MG MT LOZG
1.0000 | LOZENGE | OROMUCOSAL | Status: DC | PRN
  Filled 2018-01-20: qty 9

## 2018-01-20 MED ORDER — ONDANSETRON HCL 4 MG/2ML IJ SOLN: 4.0000 mg | Freq: Four times a day (QID) | INTRAMUSCULAR | Status: DC | PRN

## 2018-01-20 MED ORDER — TRANEXAMIC ACID-NACL 1000-0.7 MG/100ML-% IV SOLN
1000.0000 mg | Freq: Once | INTRAVENOUS | Status: AC
  Administered 2018-01-20: 1000 mg via INTRAVENOUS
  Filled 2018-01-20: qty 100

## 2018-01-20 MED ORDER — HYDROMORPHONE HCL 1 MG/ML IJ SOLN: 0.5000 mg | INTRAMUSCULAR | Status: DC | PRN

## 2018-01-20 MED ORDER — ALBUTEROL SULFATE (2.5 MG/3ML) 0.083% IN NEBU: 3.0000 mL | INHALATION_SOLUTION | RESPIRATORY_TRACT | Status: DC | PRN

## 2018-01-20 MED ORDER — KETOCONAZOLE 2 % EX CREA
1.0000 "application " | TOPICAL_CREAM | Freq: Every day | CUTANEOUS | Status: DC
  Administered 2018-01-20 – 2018-01-21 (×2): 1 via TOPICAL
  Filled 2018-01-20: qty 15

## 2018-01-20 MED ORDER — FENTANYL CITRATE (PF) 100 MCG/2ML IJ SOLN
INTRAMUSCULAR | Status: AC
  Filled 2018-01-20: qty 2

## 2018-01-20 MED ORDER — PANTOPRAZOLE SODIUM 40 MG PO TBEC
40.0000 mg | DELAYED_RELEASE_TABLET | Freq: Two times a day (BID) | ORAL | Status: DC
  Administered 2018-01-20 – 2018-01-23 (×6): 40 mg via ORAL
  Filled 2018-01-20 (×6): qty 1

## 2018-01-20 MED ORDER — SODIUM CHLORIDE 0.9 % IV SOLN
INTRAVENOUS | Status: DC | PRN
  Administered 2018-01-20: 30 ug/min via INTRAVENOUS

## 2018-01-20 MED ORDER — SODIUM CHLORIDE 0.9 % IV SOLN
INTRAVENOUS | Status: DC | PRN
  Administered 2018-01-20: 60 mL

## 2018-01-20 MED ORDER — ONDANSETRON HCL 4 MG/2ML IJ SOLN: 4.0000 mg | Freq: Once | INTRAMUSCULAR | Status: DC | PRN

## 2018-01-20 MED ORDER — BUPIVACAINE HCL (PF) 0.25 % IJ SOLN
INTRAMUSCULAR | Status: AC
  Filled 2018-01-20: qty 60

## 2018-01-20 MED ORDER — CELECOXIB 200 MG PO CAPS
ORAL_CAPSULE | ORAL | Status: AC
  Administered 2018-01-20: 400 mg via ORAL
  Filled 2018-01-20: qty 2

## 2018-01-20 MED ORDER — NYSTATIN 100000 UNIT/GM EX CREA
TOPICAL_CREAM | Freq: Two times a day (BID) | CUTANEOUS | Status: DC
  Administered 2018-01-20 – 2018-01-22 (×2): via TOPICAL
  Filled 2018-01-20: qty 15

## 2018-01-20 MED ORDER — SODIUM CHLORIDE (PF) 0.9 % IJ SOLN
INTRAMUSCULAR | Status: AC
  Filled 2018-01-20: qty 50

## 2018-01-20 MED ORDER — DEXAMETHASONE SODIUM PHOSPHATE 10 MG/ML IJ SOLN
8.0000 mg | Freq: Once | INTRAMUSCULAR | Status: AC
  Administered 2018-01-20: 8 mg via INTRAVENOUS

## 2018-01-20 MED ORDER — TRIAMTERENE-HCTZ 75-50 MG PO TABS
1.0000 | ORAL_TABLET | Freq: Every day | ORAL | Status: DC
  Filled 2018-01-20: qty 1

## 2018-01-20 MED ORDER — LEVOTHYROXINE SODIUM 100 MCG PO TABS
200.0000 ug | ORAL_TABLET | Freq: Every day | ORAL | Status: DC
  Administered 2018-01-21 – 2018-01-23 (×3): 200 ug via ORAL
  Filled 2018-01-20 (×3): qty 2

## 2018-01-20 MED ORDER — BUPIVACAINE HCL 0.25 % IJ SOLN
INTRAMUSCULAR | Status: DC | PRN
  Administered 2018-01-20: 60 mL

## 2018-01-20 MED ORDER — ALUM & MAG HYDROXIDE-SIMETH 200-200-20 MG/5ML PO SUSP: 30.0000 mL | ORAL | Status: DC | PRN

## 2018-01-20 MED ORDER — POTASSIUM CHLORIDE CRYS ER 10 MEQ PO TBCR
10.0000 meq | EXTENDED_RELEASE_TABLET | Freq: Every day | ORAL | Status: DC
  Administered 2018-01-21 – 2018-01-23 (×3): 10 meq via ORAL
  Filled 2018-01-20 (×3): qty 1

## 2018-01-20 MED ORDER — MIDAZOLAM HCL 2 MG/2ML IJ SOLN
INTRAMUSCULAR | Status: AC
  Filled 2018-01-20: qty 2

## 2018-01-20 MED ORDER — TRIAMCINOLONE ACETONIDE 0.1 % EX CREA
1.0000 "application " | TOPICAL_CREAM | CUTANEOUS | Status: DC
  Administered 2018-01-21 – 2018-01-22 (×2): 1 via TOPICAL
  Filled 2018-01-20: qty 15

## 2018-01-20 MED ORDER — ACETAMINOPHEN 10 MG/ML IV SOLN
1000.0000 mg | Freq: Four times a day (QID) | INTRAVENOUS | Status: AC
  Administered 2018-01-20 – 2018-01-21 (×3): 1000 mg via INTRAVENOUS
  Filled 2018-01-20 (×4): qty 100

## 2018-01-20 MED ORDER — FERROUS SULFATE 325 (65 FE) MG PO TABS
325.0000 mg | ORAL_TABLET | Freq: Every day | ORAL | Status: DC
  Administered 2018-01-21 – 2018-01-23 (×3): 325 mg via ORAL
  Filled 2018-01-20 (×3): qty 1

## 2018-01-20 MED ORDER — CLINDAMYCIN PHOSPHATE 900 MG/50ML IV SOLN
INTRAVENOUS | Status: AC
  Filled 2018-01-20: qty 50

## 2018-01-20 MED ORDER — LACTATED RINGERS IV SOLN
INTRAVENOUS | Status: DC
  Administered 2018-01-20 (×2): via INTRAVENOUS

## 2018-01-20 MED ORDER — GABAPENTIN 300 MG PO CAPS
600.0000 mg | ORAL_CAPSULE | Freq: Every day | ORAL | Status: DC
  Administered 2018-01-20 – 2018-01-22 (×3): 600 mg via ORAL
  Filled 2018-01-20 (×3): qty 2

## 2018-01-20 MED ORDER — POLYVINYL ALCOHOL 1.4 % OP SOLN
1.0000 [drp] | Freq: Two times a day (BID) | OPHTHALMIC | Status: DC
  Administered 2018-01-21 (×2): 1 [drp] via OPHTHALMIC
  Filled 2018-01-20: qty 15

## 2018-01-20 MED ORDER — SENNOSIDES-DOCUSATE SODIUM 8.6-50 MG PO TABS
1.0000 | ORAL_TABLET | Freq: Two times a day (BID) | ORAL | Status: DC
  Administered 2018-01-20 – 2018-01-23 (×6): 1 via ORAL
  Filled 2018-01-20 (×6): qty 1

## 2018-01-20 MED ORDER — CHLORHEXIDINE GLUCONATE 4 % EX LIQD: 60.0000 mL | Freq: Once | CUTANEOUS | Status: DC

## 2018-01-20 MED ORDER — OXYCODONE HCL 5 MG PO TABS
5.0000 mg | ORAL_TABLET | ORAL | Status: DC | PRN
  Administered 2018-01-20: 5 mg via ORAL
  Filled 2018-01-20: qty 1

## 2018-01-20 MED ORDER — METOCLOPRAMIDE HCL 10 MG PO TABS: 5.0000 mg | ORAL_TABLET | Freq: Three times a day (TID) | ORAL | Status: DC | PRN

## 2018-01-20 MED ORDER — ACETAMINOPHEN 10 MG/ML IV SOLN
INTRAVENOUS | Status: DC | PRN
  Administered 2018-01-20: 1000 mg via INTRAVENOUS

## 2018-01-20 MED ORDER — GABAPENTIN 300 MG PO CAPS
ORAL_CAPSULE | ORAL | Status: AC
  Administered 2018-01-20: 300 mg via ORAL
  Filled 2018-01-20: qty 1

## 2018-01-20 MED ORDER — NEOMYCIN-POLYMYXIN B GU 40-200000 IR SOLN
Status: DC | PRN
  Administered 2018-01-20: 16 mL

## 2018-01-20 MED ORDER — DEXAMETHASONE SODIUM PHOSPHATE 10 MG/ML IJ SOLN
INTRAMUSCULAR | Status: AC
  Administered 2018-01-20: 8 mg via INTRAVENOUS
  Filled 2018-01-20: qty 1

## 2018-01-20 MED ORDER — SEVOFLURANE IN SOLN
RESPIRATORY_TRACT | Status: AC
  Filled 2018-01-20: qty 250

## 2018-01-20 MED ORDER — ADULT MULTIVITAMIN W/MINERALS CH
1.0000 | ORAL_TABLET | Freq: Every day | ORAL | Status: DC
  Administered 2018-01-21 – 2018-01-23 (×3): 1 via ORAL
  Filled 2018-01-20 (×3): qty 1

## 2018-01-20 MED ORDER — FLUTICASONE PROPIONATE 50 MCG/ACT NA SUSP
1.0000 | Freq: Every day | NASAL | Status: DC
  Administered 2018-01-21 – 2018-01-23 (×3): 1 via NASAL
  Filled 2018-01-20: qty 16

## 2018-01-20 MED ORDER — ENOXAPARIN SODIUM 30 MG/0.3ML ~~LOC~~ SOLN
30.0000 mg | Freq: Two times a day (BID) | SUBCUTANEOUS | Status: DC
  Administered 2018-01-21 – 2018-01-23 (×5): 30 mg via SUBCUTANEOUS
  Filled 2018-01-20 (×5): qty 0.3

## 2018-01-20 MED ORDER — GABAPENTIN 300 MG PO CAPS
300.0000 mg | ORAL_CAPSULE | Freq: Once | ORAL | Status: AC
  Administered 2018-01-20: 300 mg via ORAL

## 2018-01-20 MED ORDER — MIDAZOLAM HCL 5 MG/5ML IJ SOLN
INTRAMUSCULAR | Status: DC | PRN
  Administered 2018-01-20: 2 mg via INTRAVENOUS

## 2018-01-20 MED ORDER — HYDRALAZINE HCL 50 MG PO TABS
50.0000 mg | ORAL_TABLET | Freq: Two times a day (BID) | ORAL | Status: DC
  Administered 2018-01-20 – 2018-01-23 (×6): 50 mg via ORAL
  Filled 2018-01-20 (×6): qty 1

## 2018-01-20 MED ORDER — SODIUM CHLORIDE 0.9 % IV SOLN
INTRAVENOUS | Status: DC
  Administered 2018-01-20: 14:00:00 via INTRAVENOUS

## 2018-01-20 MED ORDER — ALLOPURINOL 100 MG PO TABS
200.0000 mg | ORAL_TABLET | Freq: Every day | ORAL | Status: DC
  Administered 2018-01-21 – 2018-01-23 (×3): 200 mg via ORAL
  Filled 2018-01-20 (×3): qty 2

## 2018-01-20 MED ORDER — PROPOFOL 500 MG/50ML IV EMUL
INTRAVENOUS | Status: AC
  Filled 2018-01-20: qty 50

## 2018-01-20 MED ORDER — TETRACAINE HCL 1 % IJ SOLN
INTRAMUSCULAR | Status: DC | PRN
  Administered 2018-01-20: 6 mg via INTRASPINAL

## 2018-01-20 MED ORDER — ONDANSETRON HCL 4 MG PO TABS: 4.0000 mg | ORAL_TABLET | Freq: Four times a day (QID) | ORAL | Status: DC | PRN

## 2018-01-20 MED ORDER — PHENOL 1.4 % MT LIQD
1.0000 | OROMUCOSAL | Status: DC | PRN
  Filled 2018-01-20: qty 177

## 2018-01-20 MED ORDER — BUPIVACAINE HCL (PF) 0.5 % IJ SOLN
INTRAMUSCULAR | Status: DC | PRN
  Administered 2018-01-20: 3.2 mL

## 2018-01-20 MED ORDER — VITAMIN D 25 MCG (1000 UNIT) PO TABS
2000.0000 [IU] | ORAL_TABLET | Freq: Every day | ORAL | Status: DC
  Administered 2018-01-21 – 2018-01-23 (×3): 2000 [IU] via ORAL
  Filled 2018-01-20 (×5): qty 2

## 2018-01-20 MED ORDER — CELECOXIB 200 MG PO CAPS
200.0000 mg | ORAL_CAPSULE | Freq: Two times a day (BID) | ORAL | Status: DC
  Administered 2018-01-20 – 2018-01-23 (×6): 200 mg via ORAL
  Filled 2018-01-20 (×6): qty 1

## 2018-01-20 MED ORDER — POLYETHYL GLYCOL-PROPYL GLYCOL 0.4-0.3 % OP GEL
Freq: Every day | OPHTHALMIC | Status: DC | PRN
  Filled 2018-01-20: qty 10

## 2018-01-20 MED ORDER — MAGNESIUM HYDROXIDE 400 MG/5ML PO SUSP
30.0000 mL | Freq: Every day | ORAL | Status: DC
  Administered 2018-01-21 – 2018-01-22 (×2): 30 mL via ORAL
  Filled 2018-01-20 (×3): qty 30

## 2018-01-20 MED ORDER — CLINDAMYCIN PHOSPHATE 600 MG/50ML IV SOLN
600.0000 mg | Freq: Four times a day (QID) | INTRAVENOUS | Status: AC
  Administered 2018-01-20 – 2018-01-21 (×3): 600 mg via INTRAVENOUS
  Filled 2018-01-20 (×4): qty 50

## 2018-01-20 MED ORDER — METOCLOPRAMIDE HCL 10 MG PO TABS
10.0000 mg | ORAL_TABLET | Freq: Three times a day (TID) | ORAL | Status: AC
  Administered 2018-01-20 – 2018-01-22 (×7): 10 mg via ORAL
  Filled 2018-01-20 (×7): qty 1

## 2018-01-20 MED ORDER — MECLIZINE HCL 12.5 MG PO TABS
12.5000 mg | ORAL_TABLET | Freq: Two times a day (BID) | ORAL | Status: DC | PRN
  Filled 2018-01-20: qty 1

## 2018-01-20 MED ORDER — FLEET ENEMA 7-19 GM/118ML RE ENEM: 1.0000 | ENEMA | Freq: Once | RECTAL | Status: DC | PRN

## 2018-01-20 MED ORDER — METOCLOPRAMIDE HCL 5 MG/ML IJ SOLN: 5.0000 mg | Freq: Three times a day (TID) | INTRAMUSCULAR | Status: DC | PRN

## 2018-01-20 MED ORDER — DIPHENHYDRAMINE HCL 12.5 MG/5ML PO ELIX: 12.5000 mg | ORAL_SOLUTION | ORAL | Status: DC | PRN

## 2018-01-20 MED ORDER — FENTANYL CITRATE (PF) 100 MCG/2ML IJ SOLN: 25.0000 ug | INTRAMUSCULAR | Status: DC | PRN

## 2018-01-20 MED ORDER — TRIAMTERENE-HCTZ 37.5-25 MG PO TABS
2.0000 | ORAL_TABLET | Freq: Every day | ORAL | Status: DC
  Administered 2018-01-21 – 2018-01-23 (×3): 2 via ORAL
  Filled 2018-01-20 (×4): qty 2

## 2018-01-20 MED ORDER — TRIAMTERENE-HCTZ 75-50 MG PO TABS: 1.0000 | ORAL_TABLET | Freq: Every day | ORAL | Status: DC

## 2018-01-20 MED ORDER — ACETAMINOPHEN 325 MG PO TABS: 325.0000 mg | ORAL_TABLET | Freq: Four times a day (QID) | ORAL | Status: DC | PRN

## 2018-01-20 MED ORDER — NEOMYCIN-POLYMYXIN B GU 40-200000 IR SOLN
Status: AC
  Filled 2018-01-20: qty 20

## 2018-01-20 MED ORDER — OXYCODONE HCL 5 MG PO TABS
10.0000 mg | ORAL_TABLET | ORAL | Status: DC | PRN
  Administered 2018-01-20 – 2018-01-23 (×11): 10 mg via ORAL
  Filled 2018-01-20 (×11): qty 2

## 2018-01-20 MED ORDER — OMEGA-3-ACID ETHYL ESTERS 1 G PO CAPS
2.0000 | ORAL_CAPSULE | Freq: Two times a day (BID) | ORAL | Status: DC
  Administered 2018-01-20 – 2018-01-23 (×6): 2 g via ORAL
  Filled 2018-01-20 (×6): qty 2

## 2018-01-20 MED ORDER — TRAMADOL HCL 50 MG PO TABS
50.0000 mg | ORAL_TABLET | ORAL | Status: DC | PRN
  Administered 2018-01-21 – 2018-01-22 (×3): 100 mg via ORAL
  Filled 2018-01-20 (×3): qty 2

## 2018-01-20 MED ORDER — BISACODYL 10 MG RE SUPP: 10.0000 mg | Freq: Every day | RECTAL | Status: DC | PRN

## 2018-01-20 MED ORDER — PROPOFOL 500 MG/50ML IV EMUL
INTRAVENOUS | Status: DC | PRN
  Administered 2018-01-20: 80 ug/kg/min via INTRAVENOUS

## 2018-01-20 SURGICAL SUPPLY — 79 items
BLADE OSCILLATING/SAGITTAL (BLADE) ×2
BLADE SAW 1 (BLADE) ×2 IMPLANT
BLADE SAW 1/2 (BLADE) ×2 IMPLANT
BLADE SW THK.38XMED LNG THN (BLADE) ×1 IMPLANT
CANISTER SUCT 1200ML W/VALVE (MISCELLANEOUS) ×2 IMPLANT
CANISTER SUCT 3000ML PPV (MISCELLANEOUS) ×2 IMPLANT
CNTNR SPEC 2.5X3XGRAD LEK (MISCELLANEOUS) ×1
CONT SPEC 4OZ STER OR WHT (MISCELLANEOUS) ×1
CONT SPEC 4OZ STRL OR WHT (MISCELLANEOUS) ×1
CONTAINER SPEC 2.5X3XGRAD LEK (MISCELLANEOUS) ×1 IMPLANT
COOLER POLAR GLACIER W/PUMP (MISCELLANEOUS) ×2 IMPLANT
COVER WAND RF STERILE (DRAPES) ×2 IMPLANT
CUFF TOURN 24 STER (MISCELLANEOUS) IMPLANT
CUFF TOURN 30 STER DUAL PORT (MISCELLANEOUS) ×1 IMPLANT
DRAPE SHEET LG 3/4 BI-LAMINATE (DRAPES) ×4 IMPLANT
DRAPE TABLE BACK 80X90 (DRAPES) ×2 IMPLANT
DRSG DERMACEA 8X12 NADH (GAUZE/BANDAGES/DRESSINGS) ×2 IMPLANT
DRSG OPSITE POSTOP 4X12 (GAUZE/BANDAGES/DRESSINGS) ×1 IMPLANT
DRSG OPSITE POSTOP 4X14 (GAUZE/BANDAGES/DRESSINGS) ×2 IMPLANT
DRSG OPSITE POSTOP 4X6 (GAUZE/BANDAGES/DRESSINGS) ×1 IMPLANT
DURAPREP 26ML APPLICATOR (WOUND CARE) ×2 IMPLANT
ELECT CAUTERY BLADE 6.4 (BLADE) ×2 IMPLANT
ELECT REM PT RETURN 9FT ADLT (ELECTROSURGICAL) ×2
ELECTRODE REM PT RTRN 9FT ADLT (ELECTROSURGICAL) ×1 IMPLANT
GAUZE SPONGE 4X4 12PLY STRL (GAUZE/BANDAGES/DRESSINGS) ×2 IMPLANT
GLOVE BIOGEL M STRL SZ7.5 (GLOVE) ×2 IMPLANT
GLOVE BIOGEL PI IND STRL 9 (GLOVE) ×1 IMPLANT
GLOVE BIOGEL PI INDICATOR 9 (GLOVE) ×1
GLOVE INDICATOR 8.0 STRL GRN (GLOVE) ×2 IMPLANT
GLOVE SURG SYN 9.0  PF PI (GLOVE) ×1
GLOVE SURG SYN 9.0 PF PI (GLOVE) ×1 IMPLANT
GOWN STRL REUS W/ TWL LRG LVL3 (GOWN DISPOSABLE) ×2 IMPLANT
GOWN STRL REUS W/TWL 2XL LVL3 (GOWN DISPOSABLE) ×2 IMPLANT
GOWN STRL REUS W/TWL LRG LVL3 (GOWN DISPOSABLE) ×4
HANDPIECE VERSAJET DEBRIDEMENT (MISCELLANEOUS) ×2 IMPLANT
HEMOVAC 400CC 10FR (MISCELLANEOUS) ×2 IMPLANT
HOOD PEEL AWAY FLYTE STAYCOOL (MISCELLANEOUS) ×4 IMPLANT
IRRIGATION STRYKERFLOW (MISCELLANEOUS) ×1 IMPLANT
IRRIGATOR STRYKERFLOW (MISCELLANEOUS) ×2
KIT TURNOVER KIT A (KITS) ×2 IMPLANT
KNEE INSERT TIB 6X22 (Insert) ×1 IMPLANT
KNIFE SCULPS 14X20 (INSTRUMENTS) ×2 IMPLANT
NDL SAFETY ECLIPSE 18X1.5 (NEEDLE) ×1 IMPLANT
NDL SPNL 18GX3.5 QUINCKE PK (NEEDLE) ×1 IMPLANT
NDL SPNL 20GX3.5 QUINCKE YW (NEEDLE) ×2 IMPLANT
NEEDLE HYPO 18GX1.5 SHARP (NEEDLE) ×2
NEEDLE SPNL 18GX3.5 QUINCKE PK (NEEDLE) ×2 IMPLANT
NEEDLE SPNL 20GX3.5 QUINCKE YW (NEEDLE) ×4 IMPLANT
NS IRRIG 1000ML POUR BTL (IV SOLUTION) ×2 IMPLANT
PACK TOTAL KNEE (MISCELLANEOUS) ×2 IMPLANT
PAD ABD DERMACEA PRESS 5X9 (GAUZE/BANDAGES/DRESSINGS) ×2 IMPLANT
PAD WRAPON POLAR KNEE (MISCELLANEOUS) ×1 IMPLANT
PENCIL SMOKE ULTRAEVAC 22 CON (MISCELLANEOUS) ×2 IMPLANT
PULSAVAC PLUS IRRIG FAN TIP (DISPOSABLE) ×2
SOL .9 NS 3000ML IRR  AL (IV SOLUTION) ×1
SOL .9 NS 3000ML IRR AL (IV SOLUTION) ×1
SOL .9 NS 3000ML IRR UROMATIC (IV SOLUTION) ×1 IMPLANT
SPONGE DRAIN TRACH 4X4 STRL 2S (GAUZE/BANDAGES/DRESSINGS) ×1 IMPLANT
SPONGE LAP 18X18 RF (DISPOSABLE) ×2 IMPLANT
STAPLER SKIN PROX 35W (STAPLE) ×2 IMPLANT
STOCKINETTE BIAS CUT 4 980044 (GAUZE/BANDAGES/DRESSINGS) ×1 IMPLANT
SUCTION FRAZIER HANDLE 10FR (MISCELLANEOUS) ×1
SUCTION TUBE FRAZIER 10FR DISP (MISCELLANEOUS) ×1 IMPLANT
SUT MNCRL 0 1X36 CT-1 (SUTURE) ×1 IMPLANT
SUT MON AB 2-0 CT1 36 (SUTURE) ×2 IMPLANT
SUT MONOCRYL 0 (SUTURE) ×1
SUT PROLENE 1 CT 1 30 (SUTURE) ×2 IMPLANT
SUT VIC AB 0 CT1 36 (SUTURE) ×2 IMPLANT
SUT VIC AB 1 CT1 36 (SUTURE) ×4 IMPLANT
SUT VIC AB 2-0 CT1 (SUTURE) ×2 IMPLANT
SWAB CULTURE AMIES ANAERIB BLU (MISCELLANEOUS) ×14 IMPLANT
SYR 20CC LL (SYRINGE) ×2 IMPLANT
SYR 30ML LL (SYRINGE) ×4 IMPLANT
TIP BRUSH PULSAVAC PLUS 24.33 (MISCELLANEOUS) ×2 IMPLANT
TIP FAN IRRIG PULSAVAC PLUS (DISPOSABLE) ×1 IMPLANT
TOWEL OR 17X26 4PK STRL BLUE (TOWEL DISPOSABLE) ×2 IMPLANT
TOWER CARTRIDGE SMART MIX (DISPOSABLE) ×2 IMPLANT
TRAY FOLEY MTR SLVR 16FR STAT (SET/KITS/TRAYS/PACK) ×2 IMPLANT
WRAPON POLAR PAD KNEE (MISCELLANEOUS) ×2

## 2018-01-20 NOTE — Op Note (Signed)
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 M.D.  ASSISTANT:  Vance Peper, PA (present and scrubbed throughout the case, critical for assistance with exposure, retraction, instrumentation, and closure)  ANESTHESIA: spinal  ESTIMATED BLOOD LOSS: 25 mL  FLUIDS REPLACED: 1700 mL of crystalloid  TOURNIQUET TIME: 43 minutes  DRAINS: None  IMPLANTS UTILIZED: Stryker size 6 22 mm thick Triathlon X3 PS tibial bearing polyethylene insert.  INDICATIONS FOR SURGERY: GERALDINA PARROTT is a 67 y.o. year old female with devious history of right total knee arthroplasty performed by Dr. Albesa Seen.  She had complaints of sensation of instability and pain during positions of mid flexion.  The patient had not seen any significant improvement despite conservative nonsurgical intervention. After discussion of the risks and benefits of surgical intervention, the patient expressed understanding of the risks benefits and agree with plans for total knee revision arthroplasty.   The risks, benefits, and alternatives were discussed at length including but not limited to the risks of infection, bleeding, nerve injury, stiffness, blood clots, the need for revision surgery, cardiopulmonary complications, among others, and they were willing to proceed.  PROCEDURE IN DETAIL: The patient was brought into the operating room and, after adequate spinal anesthesia was achieved, a tourniquet was placed on the patient's upper thigh. The patient's knee and leg were cleaned and prepped with alcohol and DuraPrep and draped in the usual sterile fashion. A "timeout" was performed as per usual protocol. The lower extremity was exsanguinated using an  Esmarch, and the tourniquet was inflated to 300 mmHg. An anterior longitudinal incision was made followed by a standard medial parapatellar approach. The deep fibers of the medial collateral ligament were elevated in a subperiosteal fashion off of the medial flare of the tibia so as to maintain a continuous soft tissue sleeve. The patella was subluxed laterally and the patellofemoral ligament was incised.  Swabs were obtained of the medial and lateral gutters and submitted to the lab for stat Gram stain,, cultures, and sensitivity.  The knee was flexed and the polyethylene insert (size 6, 72mm) was elevated and removed without difficulty.  Inspection of the polyethylene demonstrated some wear along the posterior aspect of the post with some medial wear and evidence of subsurface oxidation.  The bone cement interfaces along the femoral and tibial components were cleaned of soft tissue using curettes and rongeurs and then inspected.  The interfaces were stable.  No evidence of loosening or lysis.  Polyps were also obtained of the posterior recess and submitted to the lab for stat Gram stain, cultures, and sensitivity.  First a 19 mm and subsequently a 22 mm polyethylene trial was inserted and the knee was inspected.  Excellent patellar tracking was appreciated.  Excellent mediolateral soft tissue balance was noted both in full extension and in flexion.  The trial polyethylene was removed.  The tourniquet was deflated after total tourniquet time of 43 minutes.  Hemostasis was achieved using electrocautery.The knee was irrigated with copious amounts of normal saline with antibiotic solution using pulsatile lavage and then suctioned dry. 20 mL of 1.3% Exparel and 60 mL of 0.25% Marcaine in 40 mL of normal saline was injected along the posterior capsule, medial and lateral gutters, and along  the arthrotomy site. A size 6, 22 mm thick Triathlon X3 PS tibial bearing polyethylene insert was then positioned and impacted into  place  and the knee was placed through a range of motion with excellent mediolateral soft tissue balancing appreciated and excellent patellar tracking noted.The medial parapatellar incision was reapproximated using interrupted sutures of #1 Vicryl. Subcutaneous tissue was approximated in layers using first #0 Vicryl followed #2-0 Vicryl. The skin was approximated with skin staples. A sterile dressing was applied.  The patient tolerated the procedure well and was transported to the recovery room in stable condition.    Shoshanna Mcquitty P. Holley Bouche., M.D.

## 2018-01-20 NOTE — OR Nursing (Signed)
Explanted poly from right knee

## 2018-01-20 NOTE — Progress Notes (Signed)
PT Cancellation Note  Patient Details Name: Michele Moore MRN: [id number] DOB: May 23, 1950   Cancelled Treatment:    Reason Eval/Treat Not Completed: Fatigue/lethargy limiting ability to participate;Patient not medically ready.  Order received.  Chart reviewed.  Pt very lethargic and reporting that sensation and volitional movement have not yet returned post surgery.  PT issued HEP for pt to review and pt expressed interest in "starting fresh" in the morning.  Will re-attempt when pt is more appropriate.   Roxanne Gates, PT, DPT 01/20/2018, 4:07 PM

## 2018-01-20 NOTE — H&P (Signed)
The patient has been re-examined, and the chart reviewed, and there have been no interval changes to the documented history and physical.    The risks, benefits, and alternatives have been discussed at length. The patient expressed understanding of the risks benefits and agreed with plans for surgical intervention.  Anayi Bricco P. Fahmida Jurich, Jr. M.D.    

## 2018-01-20 NOTE — Anesthesia Procedure Notes (Signed)
Date/Time: 01/20/2018 8:07 AM Performed by: Nelda Marseille, CRNA Pre-anesthesia Checklist: Patient identified, Emergency Drugs available, Suction available, Patient being monitored and Timeout performed Oxygen Delivery Method: Simple face mask

## 2018-01-20 NOTE — Anesthesia Post-op Follow-up Note (Signed)
Anesthesia QCDR form completed.        

## 2018-01-20 NOTE — Transfer of Care (Signed)
Immediate Anesthesia Transfer of Care Note  Patient: Michele Moore  Procedure(s) Performed: POLYETHYLENE EXCHANGE (Right Knee)  Patient Location: PACU  Anesthesia Type:Spinal  Level of Consciousness: sedated  Airway & Oxygen Therapy: Patient Spontanous Breathing and Patient connected to face mask oxygen  Post-op Assessment: Report given to RN and Post -op Vital signs reviewed and stable  Post vital signs: Reviewed and stable  Last Vitals:  Vitals Value Taken Time  BP    Temp    Pulse    Resp    SpO2      Last Pain:  Vitals:   01/20/18 0716  TempSrc: Tympanic         Complications: No apparent anesthesia complications

## 2018-01-20 NOTE — Anesthesia Procedure Notes (Signed)
Spinal  Patient location during procedure: OR Start time: 01/20/2018 7:25 AM End time: 01/20/2018 7:45 AM Staffing Anesthesiologist: Martha Clan, MD Performed: anesthesiologist  Preanesthetic Checklist Completed: patient identified, site marked, surgical consent, pre-op evaluation, timeout performed, IV checked, risks and benefits discussed and monitors and equipment checked Spinal Block Patient position: sitting Prep: Betadine Patient monitoring: heart rate, continuous pulse ox, blood pressure and cardiac monitor Approach: midline Location: L2-3 Injection technique: single-shot Needle Needle type: Whitacre and Introducer  Needle gauge: 22 G Needle length: 9 cm Assessment Sensory level: T10 Additional Notes Negative paresthesia. Negative blood return. Positive free-flowing CSF. Expiration date of kit checked and confirmed. Patient tolerated procedure well, without complications.

## 2018-01-20 NOTE — Care Management (Signed)
RNCM attempted to meet with patient however she was sleeping and I did not wake her.  I left a list of home health care agencies at her bedside that included my contact number. No family present at this time. RNCM will follow.

## 2018-01-20 NOTE — Anesthesia Preprocedure Evaluation (Signed)
Anesthesia Evaluation  Patient identified by MRN, date of birth, ID band Patient awake    Reviewed: Allergy & Precautions, H&P , NPO status , Patient's Chart, lab work & pertinent test results  History of Anesthesia Complications Negative for: history of anesthetic complications  Airway Mallampati: III  TM Distance: >3 FB Neck ROM: limited    Dental  (+) Poor Dentition, Chipped, Missing   Pulmonary neg shortness of breath, asthma , sleep apnea (s/p T&A, no CPAP required) , neg COPD, neg recent URI, former smoker,    Pulmonary exam normal breath sounds clear to auscultation       Cardiovascular Exercise Tolerance: Good hypertension, (-) angina(-) CAD, (-) Past MI, (-) Cardiac Stents, (-) CABG and (-) DOE Normal cardiovascular exam(-) dysrhythmias + Valvular Problems/Murmurs  Rhythm:regular Rate:Normal     Neuro/Psych  Headaches,  Neuromuscular disease negative psych ROS   GI/Hepatic Neg liver ROS, hiatal hernia, PUD, GERD  Controlled,  Endo/Other  neg diabetesHypothyroidism   Renal/GU Renal disease  negative genitourinary   Musculoskeletal  (+) Arthritis ,   Abdominal   Peds  Hematology negative hematology ROS (+)   Anesthesia Other Findings Past Medical History:   Arthritis                                                    Thyroid disease                                              Anemia                                                       GERD (gastroesophageal reflux disease)                       Hemorrhoid                                                   Multiple gastric ulcers                                      Hypertension                                                 Mixed hyperlipidemia                                         Migraines  History of hiatal hernia                                     Family history of adverse reaction to anesthes*                 Comment:sister difficult to put to sleep   Chronic kidney disease                                         Comment:STAGE 3 PER DR EASON 08/02/15  Past Surgical History:   CARPAL TUNNEL RELEASE                                           Comment:x3   ROTATOR CUFF REPAIR                                           ANKLE SURGERY                                                 TUBAL LIGATION                                                COLONOSCOPY                                      2000?        UPPER GI ENDOSCOPY                               2000?        JOINT REPLACEMENT                                               Comment:knee x 3 ,right x1 and left x2   TONSILLECTOMY                                                 Patient reports baseline numbness in right foot  Reproductive/Obstetrics negative OB ROS                             Anesthesia Physical  Anesthesia Plan  ASA: III  Anesthesia Plan: Spinal   Post-op Pain Management:  Regional for Post-op pain   Induction: Intravenous  PONV Risk Score and Plan: 2 and Propofol infusion, TIVA and Treatment may vary due to age or medical condition  Airway Management Planned: Natural Airway and Simple Face Mask  Additional Equipment:   Intra-op Plan:   Post-operative Plan:   Informed Consent: I have reviewed the patients History and Physical, chart, labs and discussed the procedure including the risks, benefits and alternatives for the proposed anesthesia with the patient or authorized representative who has indicated his/her understanding and acceptance.   Dental Advisory Given  Plan Discussed with: Anesthesiologist, CRNA and Surgeon  Anesthesia Plan Comments:         Anesthesia Quick Evaluation

## 2018-01-21 LAB — CBC
HCT: 33.5 % — ABNORMAL LOW (ref 36.0–46.0)
Hemoglobin: 10.8 g/dL — ABNORMAL LOW (ref 12.0–15.0)
MCH: 28.5 pg (ref 26.0–34.0)
MCHC: 32.2 g/dL (ref 30.0–36.0)
MCV: 88.4 fL (ref 80.0–100.0)
PLATELETS: 181 10*3/uL (ref 150–400)
RBC: 3.79 MIL/uL — AB (ref 3.87–5.11)
RDW: 13.8 % (ref 11.5–15.5)
WBC: 10.9 10*3/uL — AB (ref 4.0–10.5)
nRBC: 0 % (ref 0.0–0.2)

## 2018-01-21 MED ORDER — OXYCODONE HCL 5 MG PO TABS: 5.0000 mg | ORAL_TABLET | ORAL | 0 refills | Status: DC | PRN

## 2018-01-21 MED ORDER — TRAMADOL HCL 50 MG PO TABS: 50.0000 mg | ORAL_TABLET | ORAL | 1 refills | Status: DC | PRN

## 2018-01-21 MED ORDER — ENOXAPARIN SODIUM 40 MG/0.4ML ~~LOC~~ SOLN: 40.0000 mg | SUBCUTANEOUS | 0 refills | Status: DC

## 2018-01-21 MED ORDER — CELECOXIB 200 MG PO CAPS: 200.0000 mg | ORAL_CAPSULE | Freq: Two times a day (BID) | ORAL | 0 refills | Status: DC

## 2018-01-21 NOTE — Progress Notes (Signed)
  Subjective: 1 Day Post-Op Procedure(s) (LRB): POLYETHYLENE EXCHANGE (Right) Patient reports pain as mild.   Patient seen in rounds with Dr. Roland Rack. Patient is well, and has had no acute complaints or problems Plan is to go Home after hospital stay. Negative for chest pain and shortness of breath Fever: no Gastrointestinal: Negative for nausea and vomiting  Objective: Vital signs in last 24 hours: Temp:  [96.1 F (35.6 C)-98.6 F (37 C)] 98.5 F (36.9 C) (11/28 0329) Pulse Rate:  [46-75] 68 (11/28 0329) Resp:  [13-16] 16 (11/27 1346) BP: (95-146)/(56-86) 122/65 (11/28 0329) SpO2:  [91 %-100 %] 98 % (11/28 0329) Weight:  [111.9 kg] 111.9 kg (11/27 1346)  Intake/Output from previous day:  Intake/Output Summary (Last 24 hours) at 01/21/2018 0708 Last data filed at 01/21/2018 0016 Gross per 24 hour  Intake 2441.27 ml  Output 2675 ml  Net -233.73 ml    Intake/Output this shift: No intake/output data recorded.  Labs: Recent Labs    01/20/18 0642 01/21/18 0526  HGB 11.2* 10.8*   Recent Labs    01/20/18 0642 01/21/18 0526  WBC  --  10.9*  RBC  --  3.79*  HCT 33.0* 33.5*  PLT  --  181   Recent Labs    01/20/18 0642 01/20/18 1433  NA 140  --   K 3.9  --   CREATININE  --  1.83*  GLUCOSE 100*  --    Recent Labs    01/20/18 0622  INR 1.00     EXAM General - Patient is Alert and Oriented Extremity - Neurovascular intact Sensation intact distally Dorsiflexion/Plantar flexion intact No cellulitis present Compartment soft Dressing/Incision - clean, dry, no drainage Motor Function - intact, moving foot and toes well on exam.  Able to straight leg raise independently  Past Medical History:  Diagnosis Date  . Anemia   . Arthritis   . Asthma   . Chronic kidney disease    STAGE 3 PER DR EASON 08/02/15  . Collagen vascular disease ([place])   . Family history of adverse reaction to anesthesia    sister difficult to put to sleep  . GERD (gastroesophageal  reflux disease)   . H/O tooth extraction    all lower teeth 1/19  . Hemorrhoid   . History of hiatal hernia   . Hypertension   . Hypothyroidism   . Migraines   . Mixed hyperlipidemia   . Multiple gastric ulcers   . Thyroid disease   . Wears dentures    partial upper and lower    Assessment/Plan: 1 Day Post-Op Procedure(s) (LRB): POLYETHYLENE EXCHANGE (Right) Active Problems:   History of revision of total knee arthroplasty  Estimated body mass index is 34.41 kg/m as calculated from the following:   Height as of this encounter: 5\' 11"  (1.803 m).   Weight as of this encounter: 111.9 kg. Advance diet Up with therapy D/C IV fluids Plan for discharge tomorrow home with home health physical therapy  DVT Prophylaxis - Lovenox, Foot Pumps and TED hose Weight-Bearing as tolerated to right leg  Reche Dixon, PA-C Orthopaedic Surgery 01/21/2018, 7:08 AM

## 2018-01-21 NOTE — Evaluation (Signed)
Physical Therapy Evaluation Patient Details Name: Michele Moore MRN: [id number] DOB: 1950/03/29 Today's Date: 01/21/2018   History of Present Illness  Patient is a pleasant 67 y/o female that presents s/p R total knee revision. She has a history of reconstruction of her R foot/ankle and L TKR.   Clinical Impression  Patient is a pleasant 67 y/o female that presents s/p R total knee revision. She had been able to ambulate short community distances with an assistive device prior to this admission. She has multiple orthopedic complaints including a previous L TKR and ankle reconstruction on the R. She is able to perform transfers with minimal assistance in this session and is able to ambulate in the room with RW assistance and no buckling. Given that she has steps to enter the house and lives alone, it is unlikely she can perform the mobility necessary to return safely to her previous living establishment at her current functional level. She would likely benefit from SNF placement at discharge to improve her mobility.     Follow Up Recommendations SNF    Equipment Recommendations       Recommendations for Other Services       Precautions / Restrictions Precautions Precautions: Fall Restrictions Weight Bearing Restrictions: Yes RLE Weight Bearing: Weight bearing as tolerated      Mobility  Bed Mobility Overal bed mobility: Needs Assistance Bed Mobility: Supine to Sit     Supine to sit: Min guard     General bed mobility comments: Patient requires very minimal assistance for trunk mobility.   Transfers Overall transfer level: Needs assistance Equipment used: Rolling walker (2 wheeled) Transfers: Sit to/from Stand Sit to Stand: Supervision         General transfer comment: Patient is able to complete transfers with no external assistance.   Ambulation/Gait Ambulation/Gait assistance: Supervision Gait Distance (Feet): 30 Feet Assistive device: Rolling walker (2  wheeled) Gait Pattern/deviations: Step-to pattern   Gait velocity interpretation: <1.31 ft/sec, indicative of household ambulator General Gait Details: Patient is able to ambulate with cuing for technique with step to pattern.   Stairs            Wheelchair Mobility    Modified Rankin (Stroke Patients Only)       Balance Overall balance assessment: Modified Independent;Mild deficits observed, not formally tested                                           Pertinent Vitals/Pain Pain Assessment: Faces Faces Pain Scale: Hurts little more Pain Location: R knee  Pain Descriptors / Indicators: Operative site guarding;Aching Pain Intervention(s): Limited activity within patient's tolerance;Repositioned;Monitored during session    Home Living Family/patient expects to be discharged to:: Private residence Living Arrangements: Alone   Type of Home: House Home Access: Stairs to enter Entrance Stairs-Rails: Can reach both Entrance Stairs-Number of Steps: 5-6   Home Equipment: Walker - 2 wheels;Cane - single point      Prior Function Level of Independence: Independent with assistive device(s)         Comments: Patient has been able to ambulate short community distances with AD (RW or SPC).      Hand Dominance        Extremity/Trunk Assessment   Upper Extremity Assessment Upper Extremity Assessment: Overall WFL for tasks assessed    Lower Extremity Assessment Lower Extremity Assessment: Overall WFL for tasks  assessed;RLE deficits/detail RLE Deficits / Details: Able to complete 10 SLRs.        Communication   Communication: No difficulties  Cognition Arousal/Alertness: Awake/alert Behavior During Therapy: WFL for tasks assessed/performed Overall Cognitive Status: Within Functional Limits for tasks assessed                                        General Comments      Exercises Total Joint Exercises Ankle Circles/Pumps:  AROM;5 reps;Both Hip ABduction/ADduction: AROM;15 reps;Both Long Arc Quad: AROM;Both;15 reps Knee Flexion: AROM;AAROM;20 reps;Both Goniometric ROM: 3-60   Assessment/Plan    PT Assessment Patient needs continued PT services  PT Problem List Decreased strength;Decreased mobility;Decreased safety awareness;Decreased range of motion;Decreased knowledge of precautions;Decreased activity tolerance;Decreased balance;Decreased knowledge of use of DME       PT Treatment Interventions DME instruction;Therapeutic activities;Gait training;Therapeutic exercise;Stair training;Balance training    PT Goals (Current goals can be found in the Care Plan section)  Acute Rehab PT Goals Patient Stated Goal: To return to home mobility  PT Goal Formulation: With patient Time For Goal Achievement: 02/04/18 Potential to Achieve Goals: Good    Frequency BID   Barriers to discharge Decreased caregiver support;Inaccessible home environment      Co-evaluation               AM-PAC PT "6 Clicks" Mobility  Outcome Measure Help needed turning from your back to your side while in a flat bed without using bedrails?: A Little Help needed moving from lying on your back to sitting on the side of a flat bed without using bedrails?: A Little Help needed moving to and from a bed to a chair (including a wheelchair)?: A Little Help needed standing up from a chair using your arms (e.g., wheelchair or bedside chair)?: A Little Help needed to walk in hospital room?: A Little Help needed climbing 3-5 steps with a railing? : A Lot 6 Click Score: 17    End of Session Equipment Utilized During Treatment: Gait belt Activity Tolerance: Patient tolerated treatment well Patient left: with call bell/phone within reach;in chair;with chair alarm set Nurse Communication: Mobility status PT Visit Diagnosis: Muscle weakness (generalized) (M62.81);Difficulty in walking, not elsewhere classified (R26.2)    Time:  2426-8341 PT Time Calculation (min) (ACUTE ONLY): 26 min   Charges:   PT Evaluation $PT Eval Moderate Complexity: 1 Mod PT Treatments $Therapeutic Exercise: 8-22 mins      Royce Macadamia PT, DPT, CSCS    01/21/2018, 1:45 PM

## 2018-01-21 NOTE — Anesthesia Postprocedure Evaluation (Signed)
Anesthesia Post Note  Patient: INDEA DEARMAN  Procedure(s) Performed: POLYETHYLENE EXCHANGE (Right Knee)  Patient location during evaluation: Other Anesthesia Type: Spinal Level of consciousness: awake and alert and oriented Pain management: pain level controlled Vital Signs Assessment: post-procedure vital signs reviewed and stable Respiratory status: spontaneous breathing Cardiovascular status: blood pressure returned to baseline Postop Assessment: no headache, no backache and patient able to bend at knees Anesthetic complications: no     Last Vitals:  Vitals:   01/21/18 0329 01/21/18 0858  BP: 122/65 (!) 165/79  Pulse: 68 73  Resp:    Temp: 36.9 C 37.1 C  SpO2: 98% 100%    Last Pain:  Vitals:   01/21/18 1132  TempSrc:   PainSc: 4                  Satrina Magallanes

## 2018-01-22 NOTE — Clinical Social Work Placement (Signed)
   CLINICAL SOCIAL WORK PLACEMENT  NOTE  Date:  01/22/2018  Patient Details  Name: Michele Moore MRN: [id number] Date of Birth: 1950/09/09  Clinical Social Work is seeking post-discharge placement for this patient at the [place] level of care (*CSW will initial, date and re-position this form in  chart as items are completed):  Yes   Patient/family provided with [place] Work Department's list of facilities offering this level of care within the geographic area requested by the patient (or if unable, by the patient's family).  Yes   Patient/family informed of their freedom to choose among providers that offer the needed level of care, that participate in Medicare, Medicaid or managed care program needed by the patient, have an available bed and are willing to accept the patient.  Yes   Patient/family informed of [facility]'s ownership interest in [facility] and [facility], as well as of the fact that they are under no obligation to receive care at these facilities.  PASRR submitted to EDS on       PASRR number received on       Existing PASRR number confirmed on 01/22/18     FL2 transmitted to all facilities in geographic area requested by pt/family on 01/22/18     FL2 transmitted to all facilities within larger geographic area on       Patient informed that his/her managed care company has contracts with or will negotiate with certain facilities, including the following:        Yes   Patient/family informed of bed offers received.  Patient chooses bed at [facility] )     Physician recommends and patient chooses bed at      Patient to be transferred to   on  .  Patient to be transferred to facility by       Patient family notified on   of transfer.  Name of family member notified:        PHYSICIAN       Additional Comment:    _______________________________________________ Elster Corbello, Veronia Beets, LCSW 01/22/2018, 2:10 PM

## 2018-01-22 NOTE — Progress Notes (Signed)
PT Cancellation Note  Patient Details Name: Michele Moore MRN: [id number] DOB: [birth year]   Cancelled Treatment:    Reason Eval/Treat Not Completed: Patient declined, no reason specified.  Pt declined, stating that she would like to wait until she has received pain medication.  Will re-attempt after pt is appropriate.   Roxanne Gates, PT, DPT 01/22/2018, 9:32 AM

## 2018-01-22 NOTE — Progress Notes (Signed)
   01/22/18 1245  Clinical Encounter Type  Visited With Patient and family together  Visit Type Initial  Referral From [place] followed up with patient and family based on her daughter Trina's request when chaplain encountered her in conjunction with another patient.  Chaplain offered emotional support and utilized active and reflective listening as patient spoke of her faith.  Chaplain encouraged patient and family to reach out to chaplain as needed.

## 2018-01-22 NOTE — Clinical Social Work Note (Signed)
Clinical Social Work Assessment  Patient Details  Name: Michele Moore MRN: [id number] Date of Birth: [birth year]  Date of referral:  01/22/18               Reason for consult:  Facility Placement                Permission sought to share information with:  Chartered certified accountant granted to share information::  Yes, Verbal Permission Granted  Name::      Michele Moore::   Michele Moore   Relationship::     Contact Information:     Housing/Transportation Living arrangements for the past 2 months:  [place]ichele Moore of Information:  Patient Patient Interpreter Needed:  None Criminal Activity/Legal Involvement Pertinent to Current Situation/Hospitalization:  No - Comment as needed Significant Relationships:  Adult Children Lives with:  Self Do you feel safe going back to the place where you live?  Yes Need for family participation in patient care:  Yes (Comment)  Care giving concerns:  Patient lives alone in [place].    Social Worker assessment / plan:  Holiday representative (CSW) received SNF consult. PT is recommending SNF. CSW met with patient alone at bedside to discuss D/C plan. Patient was alert and oriented X4 and was sitting up in the bed. CSW introduced self and explained role of CSW department. Per patient she lives alone in [place] and has 3 adult daughters. CSW explained SNF process and that medicare requires a 3 night qualifying inpatient stay in a hospital in order to pay for SNF. Patient was admitted to inpatient 01/20/18. Patient is agreeable to SNF search in [facility] and prefers [place]. FL2 complete and faxed out.   CSW presented bed offers to patient and she chose [facility]. Plan is for patient to D/C to [facility] tomorrow 01/23/18 pending medical clearance. Per [facility] admissions coordinator at [facility] patient can come over the weekend to room D-4. CSW sent D/C summary to [facility] today via HUB. Patient's daughter  Michele Moore is aware of above. CSW left patient's daughter Michele Moore a Advertising account executive. CSW will continue to follow and assist as needed.   Employment status:  Retired Forensic scientist:  Medicare PT Recommendations:  [place] / Referral to community resources:  [place]  Patient/Family's Response to care:  Patient is agreeable to D/C to [facility].   Patient/Family's Understanding of and Emotional Response to Diagnosis, Current Treatment, and Prognosis:  Patient was very pleasant and thanked CSW for assistance.   Emotional Assessment Appearance:  Appears stated age Attitude/Demeanor/Rapport:    Affect (typically observed):  Accepting, Adaptable, Pleasant Orientation:  Oriented to Self, Oriented to Place, Oriented to  Time, Oriented to Situation Alcohol / Substance use:  Not Applicable Psych involvement (Current and /or in the community):  No (Comment)  Discharge Needs  Concerns to be addressed:  Discharge Planning Concerns Readmission within the last 30 days:  No Current discharge risk:  Dependent with Mobility Barriers to Discharge:  Continued Medical Work up   [employer], Veronia Beets, LCSW 01/22/2018, 2:14 PM

## 2018-01-22 NOTE — Progress Notes (Signed)
Physical Therapy Treatment Patient Details Name: Michele Moore MRN: [id number] DOB: 1951-01-31 Today's Date: 01/22/2018    History of Present Illness Patient is a pleasant 67 y/o female that presents s/p R total knee revision. She has a history of reconstruction of her R foot/ankle and L TKR.     PT Comments    Patient reporting increased pain in R knee this afternoon.  PT noted swelling in R ankle which appears to have increased since a.m.  She attempted sup to sit and stated that pain increase was too intense when sitting and promptly returned to bed.  Pt able to perform supine bed exercises and scoot up in bed with minimal assistance.  She is very pleasant and open to all education.  Pt demonstrates good understanding of importance of PT.  She will continue to benefit from skilled PT with focus on strength, tolerance to activity and pain management.  Follow Up Recommendations  SNF     Equipment Recommendations  None recommended by PT    Recommendations for Other Services       Precautions / Restrictions Precautions Precautions: Fall Restrictions Weight Bearing Restrictions: Yes RLE Weight Bearing: Weight bearing as tolerated    Mobility  Bed Mobility Overal bed mobility: Modified Independent(Pt reported increased pain in R LE and that pain medication did not help.  She also reports walking to bathroom earlier and requests to not walk again so soon.  )             General bed mobility comments: Pt able to scoot up in bed with use of L LE and bed rails.  Pt performed supine to sit at EOB but reported pain level too high to remain sitting.  Returned to supine promptly.  Transfers                    Ambulation/Gait                 Stairs             Wheelchair Mobility    Modified Rankin (Stroke Patients Only)       Balance                                            Cognition Arousal/Alertness: Awake/alert Behavior  During Therapy: WFL for tasks assessed/performed Overall Cognitive Status: Within Functional Limits for tasks assessed                                        Exercises Total Joint Exercises Ankle Circles/Pumps: AROM;Both;20 reps;Supine Quad Sets: Right;Supine;15 reps Gluteal Sets: Both;15 reps;Supine Short Arc Quad: Right;Strengthening;Supine;15 reps Heel Slides: AAROM;Right;10 reps;Supine Hip ABduction/ADduction: AROM;Right;15 reps Straight Leg Raises: AAROM;Right;Supine;15 reps    General Comments        Pertinent Vitals/Pain Pain Assessment: 0-10 Pain Score: 8  Faces Pain Scale: Hurts little more Pain Location: R knee; pt reports that she twisted her knee when transferring to [facility] and that the pain is radiating down into her ankle.  R ankle appears more swollen than in the a.m. Pain Descriptors / Indicators: Operative site guarding;Aching Pain Intervention(s): Limited activity within patient's tolerance;Monitored during session    Home Living Family/patient expects to be discharged to:: Private residence Living Arrangements: Alone  Type of Home: House Home Access: Stairs to enter Entrance Stairs-Rails: Can reach both Home Layout: One level Home Equipment: Walker - 2 wheels;Cane - single point      Prior Function Level of Independence: Independent with assistive device(s)      Comments: Patient has been able to ambulate short community distances with AD (RW or SPC).    PT Goals (current goals can now be found in the care plan section) Acute Rehab PT Goals Patient Stated Goal: to get strength back and then go home after going to rehab facility Progress towards PT goals: Progressing toward goals    Frequency    BID      PT Plan Current plan remains appropriate    Co-evaluation              AM-PAC PT "6 Clicks" Mobility   Outcome Measure  Help needed turning from your back to your side while in a flat bed without using bedrails?: A  Little Help needed moving from lying on your back to sitting on the side of a flat bed without using bedrails?: A Little Help needed moving to and from a bed to a chair (including a wheelchair)?: A Little Help needed standing up from a chair using your arms (e.g., wheelchair or bedside chair)?: A Little Help needed to walk in hospital room?: A Little Help needed climbing 3-5 steps with a railing? : A Lot 6 Click Score: 17    End of Session   Activity Tolerance: Patient limited by pain Patient left: in bed;with bed alarm set;with call bell/phone within reach Nurse Communication: Mobility status PT Visit Diagnosis: Muscle weakness (generalized) (M62.81);Difficulty in walking, not elsewhere classified (R26.2)     Time: 9758-8325 PT Time Calculation (min) (ACUTE ONLY): 25 min  Charges:  $Therapeutic Exercise: 23-37 mins                     Roxanne Gates, PT, DPT    Roxanne Gates 01/22/2018, 5:12 PM

## 2018-01-22 NOTE — Care Management (Signed)
Per CSW patient plans to go to [place] from rehab.

## 2018-01-22 NOTE — Discharge Summary (Addendum)
Physician Discharge Summary  Patient ID: Michele Moore MRN: [id number] DOB/AGE: Jul 21, 1950 67 y.o.  Admit date: 01/20/2018 Discharge date: 01/23/2018  Admission Diagnoses:  STATUS POST RIGHT KNEE REPLACEMENT   Discharge Diagnoses: Patient Active Problem List   Diagnosis Date Noted  . Female pelvic pain 01/20/2018  . History of revision of total knee arthroplasty 01/20/2018  . Iron deficiency anemia due to chronic blood loss   . Heme + stool   . Acute gastritis without hemorrhage   . Gastric polyps   . Benign neoplasm of ascending colon   . Benign neoplasm of transverse colon   . Polyp of sigmoid colon   . Hypertension 10/01/2017  . Hypothyroidism 10/01/2017  . Gastroesophageal reflux disease 10/01/2017  . Hypokalemia 10/01/2017  . Mixed hyperlipidemia 10/01/2017  . Reactive airway disease 10/01/2017  . Bradycardia 08/24/2017  . Severe obesity (BMI 35.0-35.9 with comorbidity) ([place]) 08/18/2017  . Chronic gouty arthropathy without tophi 06/25/2017  . Encounter for long-term (current) use of high-risk medication 06/25/2017  . Positive ANA (antinuclear antibody) 06/17/2017  . Cervical facet syndrome 06/16/2017  . Osteoarthritis of right shoulder due to rotator cuff injury 06/16/2017  . Sprain of anterior talofibular ligament of right ankle 06/16/2017  . Lumbar radiculopathy 04/21/2017  . Lumbar degenerative disc disease 04/21/2017  . Chronic pain syndrome 04/21/2017  . Spinal stenosis, lumbar region, with neurogenic claudication 04/21/2017  . SS-A antibody positive 03/05/2017  . Pain, lower extremity 08/03/2015  . DOE (dyspnea on exertion) 12/28/2013    Past Medical History:  Diagnosis Date  . Anemia   . Arthritis   . Asthma   . Chronic kidney disease    STAGE 3 PER DR EASON 08/02/15  . Collagen vascular disease ([place])   . Family history of adverse reaction to anesthesia    sister difficult to put to sleep  . GERD (gastroesophageal reflux disease)   . H/O tooth  extraction    all lower teeth 1/19  . Hemorrhoid   . History of hiatal hernia   . Hypertension   . Hypothyroidism   . Migraines   . Mixed hyperlipidemia   . Multiple gastric ulcers   . Thyroid disease   . Wears dentures    partial upper and lower     Transfusion: No transfusions on this admission   Consultants (if any):   Discharged Condition: Improved  Hospital Course: Michele Moore is an 67 y.o. female who was admitted 01/20/2018 with a diagnosis of painful right total knee arthroplasty with mid traction instability and went to the operating room on 01/20/2018 and underwent the above named procedures.    Surgeries:Procedure(s): POLYETHYLENE EXCHANGE on 01/20/2018  PRE-OPERATIVE DIAGNOSIS: Painful right total knee arthroplasty with mid flexion instability  POST-OPERATIVE DIAGNOSIS:  Same  PROCEDURE:  Right total knee arthroplasty polyethylene exchange  SURGEON:  Dereck Leep, Jr. M.D.  ASSISTANT:  Vance Peper, PA (present and scrubbed throughout the case, critical for assistance with exposure, retraction, instrumentation, and closure)  ANESTHESIA: spinal  ESTIMATED BLOOD LOSS: 25 mL  FLUIDS REPLACED: 1700 mL of crystalloid  TOURNIQUET TIME: 43 minutes  DRAINS: None  IMPLANTS UTILIZED: Stryker size 6 22 mm thick Triathlon X3 PS tibial bearing polyethylene insert.  INDICATIONS FOR SURGERY: Michele Moore is a 67 y.o. year old female with devious history of right total knee arthroplasty performed by Dr. Albesa Seen.  She had complaints of sensation of instability and pain during positions of mid flexion.  The patient had not seen  any significant improvement despite conservative nonsurgical intervention. After discussion of the risks and benefits of surgical intervention, the patient expressed understanding of the risks benefits and agree with plans for total knee revision arthroplasty.   The risks, benefits, and alternatives were discussed at length including  but not limited to the risks of infection, bleeding, nerve injury, stiffness, blood clots, the need for revision surgery, cardiopulmonary complications, among others, and they were willing to proceed. Patient tolerated the surgery well. No complications .Patient was taken to PACU where she was stabilized and then transferred to the orthopedic floor.  Patient started on Lovenox 30 mg q 12 hrs. Foot pumps applied bilaterally at 80 mm hgb. Heels elevated off bed with rolled towels. No evidence of DVT. Calves non tender. Negative Homan. Physical therapy started on day #1 for gait training and transfer with OT starting on  day #1 for ADL and assisted devices. Patient has done well with therapy. Ambulated 30 feet upon being discharged.  Patient's IV And Foley were discontinued on day #1 with Hemovac being discontinued on day #2. Dressing was changed on day 2 prior to patient being discharged  On postop day 3, patient was stable and ready for discharge to skilled nursing facility.   She was given perioperative antibiotics:  Anti-infectives (From admission, onward)   Start     Dose/Rate Route Frequency Ordered Stop   01/20/18 1330  clindamycin (CLEOCIN) IVPB 600 mg     600 mg 100 mL/hr over 30 Minutes Intravenous Every 6 hours 01/20/18 1218 01/21/18 1729   01/20/18 0615  clindamycin (CLEOCIN) 900 MG/50ML IVPB    Note to Pharmacy:  Phineas Real   : cabinet override      01/20/18 0615 01/20/18 0803   01/20/18 0600  clindamycin (CLEOCIN) IVPB 900 mg     900 mg 100 mL/hr over 30 Minutes Intravenous On call to O.R. 01/19/18 2146 01/20/18 0813    .  She was fitted with AV 1 compression foot pump devices, instructed on heel pumps, early ambulation, and fitted with TED stockings bilaterally for DVT prophylaxis.  She benefited maximally from the hospital stay and there were no complications.    Recent vital signs:  Vitals:   01/21/18 2355 01/22/18 0447  BP: (!) 152/93 139/89  Pulse: 86 72  Resp:  18 18  Temp: 98.2 F (36.8 C)   SpO2: 95% 98%    Recent laboratory studies:  Lab Results  Component Value Date   HGB 10.8 (L) 01/21/2018   HGB 11.2 (L) 01/20/2018   HGB 10.9 (L) 11/05/2017   Lab Results  Component Value Date   WBC 10.9 (H) 01/21/2018   PLT 181 01/21/2018   Lab Results  Component Value Date   INR 1.00 01/20/2018   Lab Results  Component Value Date   NA 140 01/20/2018   K 3.9 01/20/2018   CL 109 11/05/2017   CO2 27 11/05/2017   BUN 36 (H) 11/05/2017   CREATININE 1.83 (H) 01/20/2018   GLUCOSE 100 (H) 01/20/2018    Discharge Medications:   Allergies as of 01/22/2018      Reactions   Penicillins Anaphylaxis, Swelling   Has patient had a PCN reaction causing immediate rash, facial/tongue/throat swelling, SOB or lightheadedness with hypotension: Yes Has patient had a PCN reaction causing severe rash involving mucus membranes or skin necrosis: No Has patient had a PCN reaction that required hospitalization: No Has patient had a PCN reaction occurring within the last 10 years: Yes If  all of the above answers are "NO", then may proceed with Cephalosporin use.   Vibramycin [doxycycline Calcium] Rash      Medication List    STOP taking these medications   aspirin EC 81 MG tablet     TAKE these medications   albuterol 108 (90 Base) MCG/ACT inhaler Commonly known as:  PROVENTIL HFA;VENTOLIN HFA Inhale 2 puffs into the lungs every 4 (four) hours as needed for wheezing.   allopurinol 100 MG tablet Commonly known as:  ZYLOPRIM Take 2 tablets by mouth daily. Dr Barb Merino   celecoxib 200 MG capsule Commonly known as:  CELEBREX Take 1 capsule (200 mg total) by mouth 2 (two) times daily.   diclofenac sodium 1 % Gel Commonly known as:  VOLTAREN Apply 2 g topically 3 (three) times daily. [facility]   enoxaparin 40 MG/0.4ML injection Commonly known as:  LOVENOX Inject 0.4 mLs (40 mg total) into the skin daily.   ferrous sulfate 325 (65 FE) MG  tablet Take 325 mg by mouth daily with breakfast. otc   fluticasone 50 MCG/ACT nasal spray Commonly known as:  FLONASE SHAKE LIQUID AND USE 1 SPRAY IN EACH NOSTRIL DAILY   gabapentin 300 MG capsule Commonly known as:  NEURONTIN TAKE 2 CAPSULES BY MOUTH NIGHTLY/ Lateef What changed:    how much to take  how to take this  when to take this  additional instructions   hydrALAZINE 50 MG tablet Commonly known as:  APRESOLINE Take 1 tablet (50 mg total) by mouth 2 (two) times daily.   hydrALAZINE 50 MG tablet Commonly known as:  APRESOLINE TAKE 1 TABLET(50 MG) BY MOUTH TWICE DAILY   HYDROcodone-acetaminophen 7.5-325 MG tablet Commonly known as:  NORCO Take 1 tablet by mouth 3 (three) times daily as needed for severe pain. For Chronic Pain DNF: 12/29/2017, 01/27/2018, 02/26/2018   ketoconazole 2 % cream Commonly known as:  NIZORAL U UTD QHS What changed:    how much to take  how to take this  when to take this   levothyroxine 200 MCG tablet Commonly known as:  SYNTHROID, LEVOTHROID Take 1 tablet (200 mcg total) by mouth daily before breakfast.   meclizine 12.5 MG tablet Commonly known as:  ANTIVERT Take 1 tablet (12.5 mg total) by mouth 2 (two) times daily as needed for dizziness.   nystatin cream Commonly known as:  MYCOSTATIN APPLY EXTERNALLY TO THE AFFECTED AREA TWICE DAILY   omega-3 acid ethyl esters 1 g capsule Commonly known as:  LOVAZA TAKE 2 CAPSULES BY MOUTH TWICE DAILY   omeprazole 40 MG capsule Commonly known as:  PRILOSEC Take 1 capsule (40 mg total) by mouth daily.   ONE-A-DAY WOMENS FORMULA PO Take 1 tablet by mouth daily.   oxyCODONE 5 MG immediate release tablet Commonly known as:  Oxy IR/ROXICODONE Take 1 tablet (5 mg total) by mouth every 4 (four) hours as needed for moderate pain (pain score 4-6).   polyethylene glycol packet Commonly known as:  MIRALAX / GLYCOLAX Take 17 g by mouth daily. What changed:    when to take this  reasons  to take this   potassium chloride SA 20 MEQ tablet Commonly known as:  K-DUR,KLOR-CON Take 0.5 tablets (10 mEq total) by mouth daily.   ranitidine 150 MG tablet Commonly known as:  ZANTAC Take 1 tablet (150 mg total) by mouth 2 (two) times daily.   SYSTANE BALANCE OP Apply 1 drop to eye 2 (two) times daily.   SYSTANE  OP Place 1 drop into both eyes daily as needed (dry eyes).   tiZANidine 4 MG capsule Commonly known as:  ZANAFLEX Take 1 capsule (4 mg total) by mouth 2 (two) times daily as needed.   traMADol 50 MG tablet Commonly known as:  ULTRAM Take 1-2 tablets (50-100 mg total) by mouth every 4 (four) hours as needed for moderate pain.   triamcinolone cream 0.1 % Commonly known as:  KENALOG Apply 1 application topically every morning.   triamterene-hydrochlorothiazide 75-50 MG tablet Commonly known as:  MAXZIDE Take 1 tablet by mouth daily.   Vitamin D3 50 MCG (2000 UT) Tabs Take 2,000 Units by mouth daily.            Durable Medical Equipment  (From admission, onward)         Start     Ordered   01/20/18 1219  DME Walker rolling  Once    Question:  Patient needs a walker to treat with the following condition  Answer:  Total knee replacement status   01/20/18 1218   01/20/18 1219  DME Bedside commode  Once    Question:  Patient needs a bedside commode to treat with the following condition  Answer:  Total knee replacement status   01/20/18 1218          Diagnostic Studies: Dg Knee Right Port  Result Date: 01/20/2018 CLINICAL DATA:  Right total knee arthroplasty poly ethylene exchange. EXAM: PORTABLE RIGHT KNEE - 1-2 VIEW COMPARISON:  CT of the right knee 11/16/2015. Right knee radiographs 11/16/2015 FINDINGS: Right total knee arthroplasty is noted. New disc spacer is in place. There is gas and fluid in the joint. The knee is located. Femoral and tibial components are well seated. IMPRESSION: Revision of right knee arthroplasty without radiographic evidence  for complication. Electronically Signed   By: San Morelle M.D.   On: 01/20/2018 11:00    Disposition:   Discharge Instructions    Increase activity slowly   Complete by:  As directed       Follow-up Information    Reche Dixon, PA-C On 02/03/2018.   Specialty:  Orthopedic Surgery Why:  at 3:15pm Contact information: [place] [address] [phone]        Dereck Leep, MD On 03/11/2018.   Specialty:  Orthopedic Surgery Why:  at 11:15am Contact information: [place] [address] [phone]            Signed: Watt Climes 01/22/2018, 7:19 AM

## 2018-01-22 NOTE — Evaluation (Signed)
Occupational Therapy Evaluation Patient Details Name: Michele Moore MRN: [id number] DOB: 25-Apr-1950 Today's Date: 01/22/2018    History of Present Illness Patient is a pleasant 67 y/o female that presents s/p R total knee revision. She has a history of reconstruction of her R foot/ankle and L TKR.    Clinical Impression   Pt is 67 year old female s/p R total knee revision.  Pt was living at home alone getting Meals on Wheels and independent in all ADLs prior to surgery and is eager to return to PLOF.  Pt currently requires moderate assist for LB dressing while in seated position due to pain and limited AROM of R knee.  Pt would benefit from instruction in dressing techniques with or without assistive devices for dressing and bathing skills.  Pt would also benefit from recommendations for home modifications to increase safety in the bathroom and prevent falls. Rec SNF to continue rehab before returning home.      Follow Up Recommendations  SNF    Equipment Recommendations  Other (comment)(AD for LB dressing:  reacher, sock aid, LH shoe horn, elastic shoe laces)    Recommendations for Other Services       Precautions / Restrictions Precautions Precautions: Fall Restrictions Weight Bearing Restrictions: Yes RLE Weight Bearing: Weight bearing as tolerated      Mobility Bed Mobility Overal bed mobility: Modified Independent(Patient had just returned from visiting her granddaughter in [place] and did not feel up to transferring from bed to chair.)             General bed mobility comments: Pt able to scoot up in bed with use of L LE and bed rails.  Transfers                      Balance                                           ADL either performed or assessed with clinical judgement   ADL Overall ADL's : Needs assistance/impaired Eating/Feeding: Independent;Set up   Grooming: Wash/dry hands;Wash/dry face;Oral care;Applying deodorant;Set  up;Independent   Upper Body Bathing: Independent;Set up   Lower Body Bathing: Moderate assistance;Set up   Upper Body Dressing : Independent;Set up   Lower Body Dressing: Moderate assistance;Set up Lower Body Dressing Details (indicate cue type and reason): EOB for LB dressing but pt declined standing due to pain increased to 8/10 with sitting at EOB.  Mod assist without AD for non operative L LE and max for RLE               General ADL Comments: Pt with hx of multiple surgeries (pt reports 19) and arthritis in neck, shoulders and back and will need L Knee revised after this one heals due to having too much space in joint. She lives at home alone and will need ADL for LB dressing and rec a bag or basket on FWW for keeping phone with her at all times.      Vision Patient Visual Report: No change from baseline       Perception     Praxis      Pertinent Vitals/Pain Pain Assessment: Faces Faces Pain Scale: Hurts little more Pain Location: R knee Pain Descriptors / Indicators: Operative site guarding;Aching Pain Intervention(s): Limited activity within patient's tolerance;Monitored during session;Patient requesting pain meds-RN notified  Hand Dominance Right   Extremity/Trunk Assessment Upper Extremity Assessment Upper Extremity Assessment: Overall WFL for tasks assessed   Lower Extremity Assessment Lower Extremity Assessment: Defer to PT evaluation       Communication Communication Communication: No difficulties   Cognition Arousal/Alertness: Awake/alert Behavior During Therapy: WFL for tasks assessed/performed Overall Cognitive Status: Within Functional Limits for tasks assessed                                     General Comments       Exercises Total Joint Exercises Ankle Circles/Pumps: AROM;Both;20 reps;Supine Quad Sets: Right;10 reps;Supine Short Arc Quad: Right;Strengthening;10 reps;Supine Heel Slides: AAROM;Right;10 reps;Supine Hip  ABduction/ADduction: AROM;10 reps;Right Straight Leg Raises: AAROM;Right;10 reps;Supine Goniometric ROM: R knee ext/flex: 5-75 degrees in supine   Shoulder Instructions      Home Living Family/patient expects to be discharged to:: Private residence Living Arrangements: Alone   Type of Home: House Home Access: Stairs to enter [employer] of Steps: 5-6 Entrance Stairs-Rails: Can reach both Home Layout: One level     Bathroom Shower/Tub: Corporate investment banker: Standard Bathroom Accessibility: Yes   Home Equipment: Environmental consultant - 2 wheels;Cane - single point          Prior Functioning/Environment Level of Independence: Independent with assistive device(s)        Comments: Patient has been able to ambulate short community distances with AD (RW or SPC).         OT Problem List: Decreased strength;Pain;Decreased range of motion;Decreased activity tolerance      OT Treatment/Interventions: Self-care/ADL training;Patient/family education;DME and/or AE instruction;Therapeutic activities    OT Goals(Current goals can be found in the care plan section) Acute Rehab OT Goals Patient Stated Goal: to get strength back and then go home after going to rehab facility OT Goal Formulation: With patient Time For Goal Achievement: 02/05/18 Potential to Achieve Goals: Good ADL Goals Pt Will Perform Lower Body Dressing: with min assist;with adaptive equipment;sit to/from stand Pt Will Transfer to Toilet: with min assist;with set-up;stand pivot transfer;regular height toilet  OT Frequency: Min 2X/week   Barriers to D/C:    lives at home alone but has a lot of family to help her as needed.       Co-evaluation              AM-PAC OT "6 Clicks" Daily Activity     Outcome Measure Help from another person eating meals?: None Help from another person taking care of personal grooming?: None Help from another person toileting, which includes using toliet,  bedpan, or urinal?: A Lot Help from another person bathing (including washing, rinsing, drying)?: A Lot Help from another person to put on and taking off regular upper body clothing?: None Help from another person to put on and taking off regular lower body clothing?: A Lot 6 Click Score: 18   End of Session Nurse Communication: Other (comment)(Notified RN Anderson Malta that pt is requesting pain meds)  Activity Tolerance: Patient limited by pain Patient left: in bed;with call bell/phone within reach;with bed alarm set  OT Visit Diagnosis: Pain;Muscle weakness (generalized) (M62.81);Unsteadiness on feet (R26.81) Pain - Right/Left: Right Pain - part of body: Knee                Time: 1300-1330 OT Time Calculation (min): 30 min Charges:  OT General Charges $OT Visit: 1 Visit OT Evaluation $OT Eval Low  Complexity: 1 Low OT Treatments $Self Care/Home Management : 8-22 mins  Chrys Racer, OTR/L ascom [phone] 01/22/18, 1:47 PM

## 2018-01-22 NOTE — NC FL2 (Signed)
[place] LEVEL OF CARE SCREENING TOOL     IDENTIFICATION  Patient Name: Michele Moore Birthdate: [birth year] Sex: female Admission Date (Current Location): 01/20/2018  [facility] and [place] Number:  [name] ([id number]) Facility and Address:  [facility], [address] [place], [place] [address]      Provider Number: [id number]  Attending Physician Name and Address:  Dereck Leep, MD  Relative Name and Phone Number:       Current Level of Care: Hospital Recommended Level of Care: [place] Prior Approval Number:    Date Approved/Denied:   PASRR Number: [id number])  Discharge Plan: SNF    Current Diagnoses: Patient Active Problem List   Diagnosis Date Noted  . Female pelvic pain 01/20/2018  . History of revision of total knee arthroplasty 01/20/2018  . Iron deficiency anemia due to chronic blood loss   . Heme + stool   . Acute gastritis without hemorrhage   . Gastric polyps   . Benign neoplasm of ascending colon   . Benign neoplasm of transverse colon   . Polyp of sigmoid colon   . Hypertension 10/01/2017  . Hypothyroidism 10/01/2017  . Gastroesophageal reflux disease 10/01/2017  . Hypokalemia 10/01/2017  . Mixed hyperlipidemia 10/01/2017  . Reactive airway disease 10/01/2017  . Bradycardia 08/24/2017  . Severe obesity (BMI 35.0-35.9 with comorbidity) ([place]) 08/18/2017  . Chronic gouty arthropathy without tophi 06/25/2017  . Encounter for long-term (current) use of high-risk medication 06/25/2017  . Positive ANA (antinuclear antibody) 06/17/2017  . Cervical facet syndrome 06/16/2017  . Osteoarthritis of right shoulder due to rotator cuff injury 06/16/2017  . Sprain of anterior talofibular ligament of right ankle 06/16/2017  . Lumbar radiculopathy 04/21/2017  . Lumbar degenerative disc disease 04/21/2017  . Chronic pain syndrome 04/21/2017  . Spinal stenosis, lumbar region, with neurogenic claudication  04/21/2017  . SS-A antibody positive 03/05/2017  . Pain, lower extremity 08/03/2015  . DOE (dyspnea on exertion) 12/28/2013    Orientation RESPIRATION BLADDER Height & Weight     Self, Time, Situation, Place  Normal Continent Weight: 246 lb 11.1 oz (111.9 kg) Height:  5\' 11"  (180.3 cm)  BEHAVIORAL SYMPTOMS/MOOD NEUROLOGICAL BOWEL NUTRITION STATUS      Continent Diet(Diet: Regular )  AMBULATORY STATUS COMMUNICATION OF NEEDS Skin   Extensive Assist Verbally Surgical wounds(Incision: Right Leg )                       Personal Care Assistance Level of Assistance  Bathing, Feeding, Dressing Bathing Assistance: Limited assistance Feeding assistance: Independent Dressing Assistance: Limited assistance     Functional Limitations Info  Sight, Hearing, Speech Sight Info: Adequate Hearing Info: Adequate Speech Info: Adequate    SPECIAL CARE FACTORS FREQUENCY  PT (By licensed PT), OT (By licensed OT)     PT Frequency: (5) OT Frequency: (5)            Contractures      Additional Factors Info  Code Status, Allergies Code Status Info: (Full Code. ) Allergies Info: (Penicillins, Vibramycin Doxycycline Calcium)           Current Medications (01/22/2018):  This is the current hospital active medication list Current Facility-Administered Medications  Medication Dose Route Frequency Provider Last Rate Last Dose  . 0.9 %  sodium chloride infusion   Intravenous Continuous Hooten, Laurice Record, MD 100 mL/hr at 01/21/18 1127    . acetaminophen (TYLENOL) tablet 325-650 mg  325-650 mg Oral  Q6H PRN Hooten, Laurice Record, MD      . albuterol (PROVENTIL) (2.5 MG/3ML) 0.083% nebulizer solution 3 mL  3 mL Inhalation Q4H PRN Hooten, Laurice Record, MD      . allopurinol (ZYLOPRIM) tablet 200 mg  200 mg Oral Daily Hooten, Laurice Record, MD   200 mg at 01/21/18 1006  . alum & mag hydroxide-simeth (MAALOX/MYLANTA) 200-200-20 MG/5ML suspension 30 mL  30 mL Oral Q4H PRN Hooten, Laurice Record, MD      . bisacodyl  (DULCOLAX) suppository 10 mg  10 mg Rectal Daily PRN Hooten, Laurice Record, MD      . celecoxib (CELEBREX) capsule 200 mg  200 mg Oral BID Dereck Leep, MD   200 mg at 01/21/18 2047  . cholecalciferol (VITAMIN D3) tablet 2,000 Units  2,000 Units Oral Daily Hooten, Laurice Record, MD   2,000 Units at 01/21/18 1006  . diphenhydrAMINE (BENADRYL) 12.5 MG/5ML elixir 12.5-25 mg  12.5-25 mg Oral Q4H PRN Hooten, Laurice Record, MD      . enoxaparin (LOVENOX) injection 30 mg  30 mg Subcutaneous Q12H Hooten, Laurice Record, MD   30 mg at 01/21/18 2045  . ferrous sulfate tablet 325 mg  325 mg Oral Q breakfast Hooten, Laurice Record, MD   325 mg at 01/21/18 1005  . fluticasone (FLONASE) 50 MCG/ACT nasal spray 1 spray  1 spray Each Nare Daily Hooten, Laurice Record, MD   1 spray at 01/21/18 1012  . gabapentin (NEURONTIN) capsule 600 mg  600 mg Oral QHS Hooten, Laurice Record, MD   600 mg at 01/21/18 2046  . hydrALAZINE (APRESOLINE) tablet 50 mg  50 mg Oral BID Dereck Leep, MD   50 mg at 01/21/18 2047  . HYDROmorphone (DILAUDID) injection 0.5-1 mg  0.5-1 mg Intravenous Q4H PRN Hooten, Laurice Record, MD      . ketoconazole (NIZORAL) 2 % cream 1 application  1 application Topical QHS Hooten, Laurice Record, MD   1 application at 16/10/96 2203  . levothyroxine (SYNTHROID, LEVOTHROID) tablet 200 mcg  200 mcg Oral QAC breakfast Dereck Leep, MD   200 mcg at 01/22/18 0655  . magnesium hydroxide (MILK OF MAGNESIA) suspension 30 mL  30 mL Oral Daily Hooten, Laurice Record, MD   30 mL at 01/21/18 1015  . meclizine (ANTIVERT) tablet 12.5 mg  12.5 mg Oral BID PRN Hooten, Laurice Record, MD      . menthol-cetylpyridinium (CEPACOL) lozenge 3 mg  1 lozenge Oral PRN Hooten, Laurice Record, MD       Or  . phenol (CHLORASEPTIC) mouth spray 1 spray  1 spray Mouth/Throat PRN Hooten, Laurice Record, MD      . metoCLOPramide (REGLAN) tablet 5-10 mg  5-10 mg Oral Q8H PRN Hooten, Laurice Record, MD       Or  . metoCLOPramide (REGLAN) injection 5-10 mg  5-10 mg Intravenous Q8H PRN Hooten, Laurice Record, MD      .  metoCLOPramide (REGLAN) tablet 10 mg  10 mg Oral TID AC & HS Hooten, Laurice Record, MD   10 mg at 01/21/18 2047  . multivitamin with minerals tablet 1 tablet  1 tablet Oral Daily Hooten, Laurice Record, MD   1 tablet at 01/21/18 1006  . nystatin cream (MYCOSTATIN)   Topical BID Hooten, Laurice Record, MD      . omega-3 acid ethyl esters (LOVAZA) capsule 2 g  2 capsule Oral BID Dereck Leep, MD   2 g at 01/21/18 2045  . ondansetron (  ZOFRAN) tablet 4 mg  4 mg Oral Q6H PRN Hooten, Laurice Record, MD       Or  . ondansetron (ZOFRAN) injection 4 mg  4 mg Intravenous Q6H PRN Hooten, Laurice Record, MD      . oxyCODONE (Oxy IR/ROXICODONE) immediate release tablet 10 mg  10 mg Oral Q4H PRN Dereck Leep, MD   10 mg at 01/21/18 2047  . oxyCODONE (Oxy IR/ROXICODONE) immediate release tablet 5 mg  5 mg Oral Q4H PRN Hooten, Laurice Record, MD   5 mg at 01/20/18 1329  . pantoprazole (PROTONIX) EC tablet 40 mg  40 mg Oral BID Dereck Leep, MD   40 mg at 01/21/18 2047  . polyethylene glycol 0.4% and propylene glycol 0.3% (SYSTANE) ophthalmic gel   Both Eyes Daily PRN Hooten, Laurice Record, MD      . polyvinyl alcohol (LIQUIFILM TEARS) 1.4 % ophthalmic solution 1 drop  1 drop Both Eyes BID Hooten, Laurice Record, MD   1 drop at 01/21/18 2233  . potassium chloride (K-DUR,KLOR-CON) CR tablet 10 mEq  10 mEq Oral Daily Hooten, Laurice Record, MD   10 mEq at 01/21/18 1003  . senna-docusate (Senokot-S) tablet 1 tablet  1 tablet Oral BID Dereck Leep, MD   1 tablet at 01/21/18 2046  . sodium phosphate (FLEET) 7-19 GM/118ML enema 1 enema  1 enema Rectal Once PRN Hooten, Laurice Record, MD      . traMADol Veatrice Bourbon) tablet 50-100 mg  50-100 mg Oral Q4H PRN Dereck Leep, MD   100 mg at 01/21/18 1720  . triamcinolone cream (KENALOG) 0.1 % 1 application  1 application Topical FM-B8G Hooten, Laurice Record, MD   1 application at 66/59/93 1020  . triamterene-hydrochlorothiazide (MAXZIDE-25) 37.5-25 MG per tablet 2 tablet  2 tablet Oral Daily Paticia Stack, [facility]   2 tablet at 01/21/18  1011     Discharge Medications: Please see discharge summary for a list of discharge medications.  Relevant Imaging Results:  Relevant Lab Results:   Additional Information (SSN: [id number])  Eshaan Titzer, Veronia Beets, LCSW

## 2018-01-22 NOTE — Progress Notes (Signed)
   01/22/18 1400  Clinical Encounter Type  Visited With Patient  Visit Type Follow-up;Spiritual support  Recommendations Return if needed.  Spiritual Encounters  Spiritual Needs Emotional   Chaplain followed up on the OR for prayer. The patient was receiving care but shared that Loletta Specter had met with her previously. No further care was needed. Chaplain closed the OR.

## 2018-01-22 NOTE — Progress Notes (Signed)
Physical Therapy Treatment Patient Details Name: Michele Moore MRN: [id number] DOB: 10-Nov-1950 Today's Date: 01/22/2018    History of Present Illness Patient is a pleasant 67 y/o female that presents s/p R total knee revision. She has a history of reconstruction of her R foot/ankle and L TKR.     PT Comments    Patient had just returned from visiting her granddaughter in CCU and required time for conversation and processing of emotions.  She did not feel up to transferring to recliner and requested that she do bed exercises only.  Pt demonstrated good understanding of all there ex with need for minimal VC's and manual assistance.  She also demonstrated ability to scoot up in bed with L LE unilateral bridge and use of UE's.  Pt did not report high pain level and only presented with signs of pain during assisted heel slides.  She was able to achieve 5-75 degrees of knee ext/flexion.  Pt very open to all education.  She will continue to benefit from skilled PT with focus on strength, pain management, knee ROM and tolerance to activity.  Follow Up Recommendations  SNF     Equipment Recommendations       Recommendations for Other Services       Precautions / Restrictions Precautions Precautions: Fall Restrictions Weight Bearing Restrictions: Yes RLE Weight Bearing: Weight bearing as tolerated    Mobility  Bed Mobility Overal bed mobility: Modified Independent(Patient had just returned from visiting her granddaughter in CCU and did not feel up to transferring from bed to chair.)             General bed mobility comments: Pt able to scoot up in bed with use of L LE and bed rails.  Transfers                    Ambulation/Gait                 Stairs             Wheelchair Mobility    Modified Rankin (Stroke Patients Only)       Balance                                            Cognition Arousal/Alertness: Awake/alert Behavior  During Therapy: WFL for tasks assessed/performed Overall Cognitive Status: Within Functional Limits for tasks assessed                                        Exercises Total Joint Exercises Ankle Circles/Pumps: AROM;Both;20 reps;Supine Quad Sets: Right;10 reps;Supine Short Arc Quad: Right;Strengthening;10 reps;Supine Heel Slides: AAROM;Right;10 reps;Supine Hip ABduction/ADduction: AROM;10 reps;Right Straight Leg Raises: AAROM;Right;10 reps;Supine Goniometric ROM: R knee ext/flex: 5-75 degrees in supine    General Comments        Pertinent Vitals/Pain Pain Assessment: Faces Faces Pain Scale: Hurts a little bit Pain Location: R knee Pain Intervention(s): Limited activity within patient's tolerance    Home Living                      Prior Function            PT Goals (current goals can now be found in the care plan section) Acute Rehab PT Goals Patient  Stated Goal: To return to home mobility  PT Goal Formulation: With patient Time For Goal Achievement: 02/04/18 Potential to Achieve Goals: Good Progress towards PT goals: Progressing toward goals    Frequency           PT Plan      Co-evaluation              AM-PAC PT "6 Clicks" Mobility   Outcome Measure                   End of Session               Time: 2774-1287 PT Time Calculation (min) (ACUTE ONLY): 23 min  Charges:  $Therapeutic Exercise: 23-37 mins                     Roxanne Gates, PT, DPT    Roxanne Gates 01/22/2018, 1:05 PM

## 2018-01-22 NOTE — Progress Notes (Signed)
   Subjective: 2 Days Post-Op Procedure(s) (LRB): POLYETHYLENE EXCHANGE (Right) Patient reports pain as moderate.   Patient is well, and has had no acute complaints or problems Patient was able to ambulate 30 feet yesterday..  Plan is to go Rehab after hospital stay. no nausea and no vomiting Patient denies any chest pains or shortness of breath. Objective: Vital signs in last 24 hours: Temp:  [98.2 F (36.8 C)-98.7 F (37.1 C)] 98.2 F (36.8 C) (11/28 2355) Pulse Rate:  [72-86] 72 (11/29 0447) Resp:  [18] 18 (11/29 0447) BP: (138-165)/(79-93) 139/89 (11/29 0447) SpO2:  [95 %-100 %] 98 % (11/29 0447) well approximated incision Heels are non tender and elevated off the bed using rolled towels Intake/Output from previous day: 11/28 0701 - 11/29 0700 In: 2027.8 [P.O.:480; I.V.:1303.8; IV Piggyback:244] Out: -  Intake/Output this shift: No intake/output data recorded.  Recent Labs    01/20/18 0642 01/21/18 0526  HGB 11.2* 10.8*   Recent Labs    01/20/18 0642 01/21/18 0526  WBC  --  10.9*  RBC  --  3.79*  HCT 33.0* 33.5*  PLT  --  181   Recent Labs    01/20/18 0642 01/20/18 1433  NA 140  --   K 3.9  --   CREATININE  --  1.83*  GLUCOSE 100*  --    Recent Labs    01/20/18 0622  INR 1.00    EXAM General - Patient is Alert, Appropriate and Oriented Extremity - Neurologically intact Neurovascular intact Sensation intact distally Intact pulses distally Dorsiflexion/Plantar flexion intact No cellulitis present Compartment soft Dressing - moderate drainage Motor Function - intact, moving foot and toes well on exam.    Past Medical History:  Diagnosis Date  . Anemia   . Arthritis   . Asthma   . Chronic kidney disease    STAGE 3 PER DR EASON 08/02/15  . Collagen vascular disease ([place])   . Family history of adverse reaction to anesthesia    sister difficult to put to sleep  . GERD (gastroesophageal reflux disease)   . H/O tooth extraction    all lower  teeth 1/19  . Hemorrhoid   . History of hiatal hernia   . Hypertension   . Hypothyroidism   . Migraines   . Mixed hyperlipidemia   . Multiple gastric ulcers   . Thyroid disease   . Wears dentures    partial upper and lower    Assessment/Plan: 2 Days Post-Op Procedure(s) (LRB): POLYETHYLENE EXCHANGE (Right) Active Problems:   History of revision of total knee arthroplasty  Estimated body mass index is 34.41 kg/m as calculated from the following:   Height as of this encounter: 5\' 11"  (1.803 m).   Weight as of this encounter: 111.9 kg. Up with therapy Plan for discharge tomorrow Discharge to SNF  Labs: None DVT Prophylaxis - Lovenox, Foot Pumps and TED hose Weight-Bearing as tolerated to right leg The Hemovac was discontinued today.  Into the drain appeared to be intact. Please change dressing today. Patient needs a bowel movement  Goro Wenrick R. [name] 01/22/2018, 7:14 AM

## 2018-01-22 NOTE — Progress Notes (Signed)
PT Cancellation Note  Patient Details Name: Michele Moore MRN: [id number] DOB: 12/13/1950   Cancelled Treatment:    Reason Eval/Treat Not Completed: Patient at procedure or test/unavailable.  Pt currently not in room.  Will re-attempt later when pt is available.   Roxanne Gates, PT, DPT 01/22/2018, 11:02 AM

## 2018-01-22 NOTE — Care Management Important Message (Signed)
Initial Medicare IM signed.  Copy left in patient's room.

## 2018-01-23 DIAGNOSIS — M25511 Pain in right shoulder: Secondary | ICD-10-CM | POA: Diagnosis not present

## 2018-01-23 DIAGNOSIS — E876 Hypokalemia: Secondary | ICD-10-CM | POA: Diagnosis not present

## 2018-01-23 DIAGNOSIS — E039 Hypothyroidism, unspecified: Secondary | ICD-10-CM | POA: Diagnosis not present

## 2018-01-23 DIAGNOSIS — M25561 Pain in right knee: Secondary | ICD-10-CM | POA: Diagnosis not present

## 2018-01-23 DIAGNOSIS — Z96651 Presence of right artificial knee joint: Secondary | ICD-10-CM | POA: Diagnosis not present

## 2018-01-23 DIAGNOSIS — R262 Difficulty in walking, not elsewhere classified: Secondary | ICD-10-CM | POA: Diagnosis not present

## 2018-01-23 DIAGNOSIS — M1990 Unspecified osteoarthritis, unspecified site: Secondary | ICD-10-CM | POA: Diagnosis not present

## 2018-01-23 DIAGNOSIS — M069 Rheumatoid arthritis, unspecified: Secondary | ICD-10-CM | POA: Diagnosis not present

## 2018-01-23 DIAGNOSIS — D509 Iron deficiency anemia, unspecified: Secondary | ICD-10-CM | POA: Diagnosis not present

## 2018-01-23 DIAGNOSIS — R52 Pain, unspecified: Secondary | ICD-10-CM | POA: Diagnosis not present

## 2018-01-23 DIAGNOSIS — Z741 Need for assistance with personal care: Secondary | ICD-10-CM | POA: Diagnosis not present

## 2018-01-23 DIAGNOSIS — M25531 Pain in right wrist: Secondary | ICD-10-CM | POA: Diagnosis not present

## 2018-01-23 DIAGNOSIS — E559 Vitamin D deficiency, unspecified: Secondary | ICD-10-CM | POA: Diagnosis not present

## 2018-01-23 DIAGNOSIS — M6281 Muscle weakness (generalized): Secondary | ICD-10-CM | POA: Diagnosis not present

## 2018-01-23 DIAGNOSIS — R278 Other lack of coordination: Secondary | ICD-10-CM | POA: Diagnosis not present

## 2018-01-23 DIAGNOSIS — Z7401 Bed confinement status: Secondary | ICD-10-CM | POA: Diagnosis not present

## 2018-01-23 DIAGNOSIS — Z471 Aftercare following joint replacement surgery: Secondary | ICD-10-CM | POA: Diagnosis not present

## 2018-01-23 DIAGNOSIS — M25569 Pain in unspecified knee: Secondary | ICD-10-CM | POA: Diagnosis not present

## 2018-01-23 DIAGNOSIS — M109 Gout, unspecified: Secondary | ICD-10-CM | POA: Diagnosis not present

## 2018-01-23 DIAGNOSIS — R600 Localized edema: Secondary | ICD-10-CM | POA: Diagnosis not present

## 2018-01-23 DIAGNOSIS — Z9889 Other specified postprocedural states: Secondary | ICD-10-CM | POA: Diagnosis not present

## 2018-01-23 NOTE — Clinical Social Work Note (Signed)
The patient will discharge today to [facility] via non-emergent EMS. The patient, her family, and the facility are aware and in agreement. The CSW has sent updated discharge information to the facility and has delivered the discharge packet. The CSW is signing off. Please consult should needs arise.  Santiago Bumpers, MSW, Latanya Presser [phone]

## 2018-01-23 NOTE — Progress Notes (Signed)
Physical Therapy Treatment Patient Details Name: Michele Moore MRN: [id number] DOB: December 26, 1950 Today's Date: 01/23/2018    History of Present Illness Patient is a pleasant 67 y/o female that presents s/p R total knee revision. She has a history of reconstruction of her R foot/ankle and L TKR.     PT Comments    Participated in exercises as described below.  ROM progressing nicely today 5-95.  Bed mobility without assist.  Stood with min guard and ambulated to door and back to recliner.  Self limiting gait due to pain and vertigo.  Voiced fear of falling and reports frequent falls at home due to vertigo.     Follow Up Recommendations  SNF     Equipment Recommendations  None recommended by PT    Recommendations for Other Services       Precautions / Restrictions Precautions Precautions: Fall Restrictions Weight Bearing Restrictions: Yes RLE Weight Bearing: Weight bearing as tolerated    Mobility  Bed Mobility Overal bed mobility: Modified Independent       Supine to sit: Min guard        Transfers Overall transfer level: Needs assistance Equipment used: Rolling walker (2 wheeled) Transfers: Sit to/from Stand Sit to Stand: Min guard            Ambulation/Gait Ambulation/Gait assistance: Supervision;Min guard Gait Distance (Feet): 30 Feet Assistive device: Rolling walker (2 wheeled) Gait Pattern/deviations: Step-to pattern Gait velocity: decreased       Stairs             Wheelchair Mobility    Modified Rankin (Stroke Patients Only)       Balance Overall balance assessment: Needs assistance Sitting-balance support: Feet supported Sitting balance-Leahy Scale: Good     Standing balance support: Bilateral upper extremity supported Standing balance-Leahy Scale: Fair Standing balance comment: pt fearful of falling due to vertigo but no LOB noted                            Cognition Arousal/Alertness: Awake/alert Behavior  During Therapy: WFL for tasks assessed/performed Overall Cognitive Status: Within Functional Limits for tasks assessed                                        Exercises Total Joint Exercises Ankle Circles/Pumps: AROM;Both;20 reps;Supine Quad Sets: Right;Supine;15 reps Gluteal Sets: Both;15 reps;Supine Heel Slides: AAROM;Right;10 reps;Supine Straight Leg Raises: AAROM;Right;Supine;15 reps Knee Flexion: AROM;AAROM;Right;10 reps Goniometric ROM: 4-95    General Comments        Pertinent Vitals/Pain Pain Assessment: 0-10 Pain Score: 7  Pain Location: General post op pain knee Pain Descriptors / Indicators: Operative site guarding;Aching Pain Intervention(s): Limited activity within patient's tolerance;Monitored during session;Ice applied    Home Living                      Prior Function            PT Goals (current goals can now be found in the care plan section) Progress towards PT goals: Progressing toward goals    Frequency    BID      PT Plan Current plan remains appropriate    Co-evaluation              AM-PAC PT "6 Clicks" Mobility   Outcome Measure  Help needed turning from your back  to your side while in a flat bed without using bedrails?: None Help needed moving from lying on your back to sitting on the side of a flat bed without using bedrails?: None Help needed moving to and from a bed to a chair (including a wheelchair)?: A Little Help needed standing up from a chair using your arms (e.g., wheelchair or bedside chair)?: A Little Help needed to walk in hospital room?: A Little Help needed climbing 3-5 steps with a railing? : A Lot 6 Click Score: 19    End of Session Equipment Utilized During Treatment: Gait belt Activity Tolerance: Patient limited by pain Patient left: with call bell/phone within reach;in chair;with chair alarm set         Time: [phone] PT Time Calculation (min) (ACUTE ONLY): 24  min  Charges:  $Gait Training: 8-22 mins $Therapeutic Exercise: 8-22 mins                     Chesley Noon, PTA 01/23/18, 10:10 AM

## 2018-01-23 NOTE — Progress Notes (Signed)
TED hose on both legs, surgical dressing clean, dry and  intact.  EMS here to transport pt.

## 2018-01-23 NOTE — Progress Notes (Signed)
   Subjective: 3 Days Post-Op Procedure(s) (LRB): POLYETHYLENE EXCHANGE (Right) Patient reports pain as moderate.   Patient is well, and has had no acute complaints or problems Plan is to go Rehab after hospital stay. no nausea and no vomiting Patient denies any chest pains or shortness of breath.  Objective: Vital signs in last 24 hours: Temp:  [98.1 F (36.7 C)-98.4 F (36.9 C)] 98.4 F (36.9 C) (11/30 0732) Pulse Rate:  [51-70] 70 (11/30 0732) Resp:  [18-19] 18 (11/30 0732) BP: (130-148)/(78-115) 148/78 (11/30 0732) SpO2:  [97 %-100 %] 100 % (11/30 0732)  Intake/Output from previous day: 11/29 0701 - 11/30 0700 In: 720 [P.O.:720] Out: -  Intake/Output this shift: No intake/output data recorded.  Recent Labs    01/21/18 0526  HGB 10.8*   Recent Labs    01/21/18 0526  WBC 10.9*  RBC 3.79*  HCT 33.5*  PLT 181   Recent Labs    01/20/18 1433  CREATININE 1.83*   No results for input(s): LABPT, INR in the last 72 hours.  EXAM General - Patient is Alert, Appropriate and Oriented Extremity - Neurologically intact Neurovascular intact Sensation intact distally Intact pulses distally Dorsiflexion/Plantar flexion intact No cellulitis present Compartment soft  - homans Dressing - dressing C/D/I and no drainage Motor Function - intact, moving foot and toes well on exam.    Past Medical History:  Diagnosis Date  . Anemia   . Arthritis   . Asthma   . Chronic kidney disease    STAGE 3 PER DR EASON 08/02/15  . Collagen vascular disease ([place])   . Family history of adverse reaction to anesthesia    sister difficult to put to sleep  . GERD (gastroesophageal reflux disease)   . H/O tooth extraction    all lower teeth 1/19  . Hemorrhoid   . History of hiatal hernia   . Hypertension   . Hypothyroidism   . Migraines   . Mixed hyperlipidemia   . Multiple gastric ulcers   . Thyroid disease   . Wears dentures    partial upper and lower    Assessment/Plan: 3  Days Post-Op Procedure(s) (LRB): POLYETHYLENE EXCHANGE (Right) Active Problems:   History of revision of total knee arthroplasty  Estimated body mass index is 34.41 kg/m as calculated from the following:   Height as of this encounter: 5\' 11"  (1.803 m).   Weight as of this encounter: 111.9 kg. Up with therapy Discharge to SNF today Follow-up with [facility] orthopedics in 2 weeks  Labs: None DVT Prophylaxis - Lovenox, Foot Pumps and TED hose Weight-Bearing as tolerated to right leg   Duanne Guess PA-C [place] 01/23/2018, 8:38 AM

## 2018-01-23 NOTE — Progress Notes (Signed)
Report called to [facility], EMS called for transportation.

## 2018-01-25 LAB — AEROBIC/ANAEROBIC CULTURE W GRAM STAIN (SURGICAL/DEEP WOUND)
Culture: NO GROWTH
Culture: NO GROWTH

## 2018-01-25 LAB — AEROBIC/ANAEROBIC CULTURE (SURGICAL/DEEP WOUND): CULTURE: NO GROWTH

## 2018-01-25 NOTE — Addendum Note (Signed)
Addendum  created 01/25/18 0802 by Martha Clan, MD   Attestation recorded in [place], [place] filed

## 2018-01-26 DIAGNOSIS — M109 Gout, unspecified: Secondary | ICD-10-CM | POA: Diagnosis not present

## 2018-01-26 DIAGNOSIS — Z96651 Presence of right artificial knee joint: Secondary | ICD-10-CM | POA: Diagnosis not present

## 2018-01-26 DIAGNOSIS — M1990 Unspecified osteoarthritis, unspecified site: Secondary | ICD-10-CM | POA: Diagnosis not present

## 2018-01-26 DIAGNOSIS — D509 Iron deficiency anemia, unspecified: Secondary | ICD-10-CM | POA: Diagnosis not present

## 2018-01-26 DIAGNOSIS — E039 Hypothyroidism, unspecified: Secondary | ICD-10-CM | POA: Diagnosis not present

## 2018-01-28 ENCOUNTER — Other Ambulatory Visit: Payer: Self-pay | Admitting: Student in an Organized Health Care Education/Training Program

## 2018-01-28 MED ORDER — GABAPENTIN 300 MG PO CAPS: 600.0000 mg | ORAL_CAPSULE | Freq: Every day | ORAL | 4 refills | Status: DC

## 2018-01-28 NOTE — Progress Notes (Signed)
Gabapentin sent in.

## 2018-01-31 ENCOUNTER — Other Ambulatory Visit: Payer: Self-pay | Admitting: Family Medicine

## 2018-01-31 DIAGNOSIS — I10 Essential (primary) hypertension: Secondary | ICD-10-CM

## 2018-02-10 DIAGNOSIS — Z9889 Other specified postprocedural states: Secondary | ICD-10-CM | POA: Diagnosis not present

## 2018-02-10 DIAGNOSIS — R600 Localized edema: Secondary | ICD-10-CM | POA: Diagnosis not present

## 2018-02-10 DIAGNOSIS — Z96651 Presence of right artificial knee joint: Secondary | ICD-10-CM | POA: Diagnosis not present

## 2018-02-14 ENCOUNTER — Other Ambulatory Visit: Payer: Self-pay | Admitting: Family Medicine

## 2018-02-14 DIAGNOSIS — Z471 Aftercare following joint replacement surgery: Secondary | ICD-10-CM | POA: Diagnosis not present

## 2018-02-14 DIAGNOSIS — M5116 Intervertebral disc disorders with radiculopathy, lumbar region: Secondary | ICD-10-CM | POA: Diagnosis not present

## 2018-02-14 DIAGNOSIS — Z6835 Body mass index (BMI) 35.0-35.9, adult: Principal | ICD-10-CM

## 2018-02-14 DIAGNOSIS — D122 Benign neoplasm of ascending colon: Secondary | ICD-10-CM | POA: Diagnosis not present

## 2018-02-14 DIAGNOSIS — D123 Benign neoplasm of transverse colon: Secondary | ICD-10-CM | POA: Diagnosis not present

## 2018-02-14 DIAGNOSIS — I129 Hypertensive chronic kidney disease with stage 1 through stage 4 chronic kidney disease, or unspecified chronic kidney disease: Secondary | ICD-10-CM | POA: Diagnosis not present

## 2018-02-14 DIAGNOSIS — D125 Benign neoplasm of sigmoid colon: Secondary | ICD-10-CM | POA: Diagnosis not present

## 2018-02-14 DIAGNOSIS — K219 Gastro-esophageal reflux disease without esophagitis: Secondary | ICD-10-CM | POA: Diagnosis not present

## 2018-02-14 DIAGNOSIS — E039 Hypothyroidism, unspecified: Secondary | ICD-10-CM | POA: Diagnosis not present

## 2018-02-14 DIAGNOSIS — M25561 Pain in right knee: Secondary | ICD-10-CM | POA: Diagnosis not present

## 2018-02-14 DIAGNOSIS — Z96651 Presence of right artificial knee joint: Secondary | ICD-10-CM | POA: Diagnosis not present

## 2018-02-14 DIAGNOSIS — N183 Chronic kidney disease, stage 3 (moderate): Secondary | ICD-10-CM | POA: Diagnosis not present

## 2018-02-14 DIAGNOSIS — J45909 Unspecified asthma, uncomplicated: Secondary | ICD-10-CM | POA: Diagnosis not present

## 2018-02-14 DIAGNOSIS — M1A9XX0 Chronic gout, unspecified, without tophus (tophi): Secondary | ICD-10-CM | POA: Diagnosis not present

## 2018-02-14 DIAGNOSIS — R262 Difficulty in walking, not elsewhere classified: Secondary | ICD-10-CM | POA: Diagnosis not present

## 2018-02-14 DIAGNOSIS — E559 Vitamin D deficiency, unspecified: Secondary | ICD-10-CM | POA: Diagnosis not present

## 2018-02-14 DIAGNOSIS — E785 Hyperlipidemia, unspecified: Secondary | ICD-10-CM

## 2018-02-16 DIAGNOSIS — Z471 Aftercare following joint replacement surgery: Secondary | ICD-10-CM | POA: Diagnosis not present

## 2018-02-16 DIAGNOSIS — J45909 Unspecified asthma, uncomplicated: Secondary | ICD-10-CM | POA: Diagnosis not present

## 2018-02-16 DIAGNOSIS — I129 Hypertensive chronic kidney disease with stage 1 through stage 4 chronic kidney disease, or unspecified chronic kidney disease: Secondary | ICD-10-CM | POA: Diagnosis not present

## 2018-02-16 DIAGNOSIS — M5116 Intervertebral disc disorders with radiculopathy, lumbar region: Secondary | ICD-10-CM | POA: Diagnosis not present

## 2018-02-16 DIAGNOSIS — N183 Chronic kidney disease, stage 3 (moderate): Secondary | ICD-10-CM | POA: Diagnosis not present

## 2018-02-16 DIAGNOSIS — E039 Hypothyroidism, unspecified: Secondary | ICD-10-CM | POA: Diagnosis not present

## 2018-02-22 DIAGNOSIS — M5116 Intervertebral disc disorders with radiculopathy, lumbar region: Secondary | ICD-10-CM | POA: Diagnosis not present

## 2018-02-22 DIAGNOSIS — I129 Hypertensive chronic kidney disease with stage 1 through stage 4 chronic kidney disease, or unspecified chronic kidney disease: Secondary | ICD-10-CM | POA: Diagnosis not present

## 2018-02-22 DIAGNOSIS — Z471 Aftercare following joint replacement surgery: Secondary | ICD-10-CM | POA: Diagnosis not present

## 2018-02-22 DIAGNOSIS — E039 Hypothyroidism, unspecified: Secondary | ICD-10-CM | POA: Diagnosis not present

## 2018-02-22 DIAGNOSIS — N183 Chronic kidney disease, stage 3 (moderate): Secondary | ICD-10-CM | POA: Diagnosis not present

## 2018-02-22 DIAGNOSIS — J45909 Unspecified asthma, uncomplicated: Secondary | ICD-10-CM | POA: Diagnosis not present

## 2018-02-25 DIAGNOSIS — I129 Hypertensive chronic kidney disease with stage 1 through stage 4 chronic kidney disease, or unspecified chronic kidney disease: Secondary | ICD-10-CM | POA: Diagnosis not present

## 2018-02-25 DIAGNOSIS — M5116 Intervertebral disc disorders with radiculopathy, lumbar region: Secondary | ICD-10-CM | POA: Diagnosis not present

## 2018-02-25 DIAGNOSIS — E039 Hypothyroidism, unspecified: Secondary | ICD-10-CM | POA: Diagnosis not present

## 2018-02-25 DIAGNOSIS — J45909 Unspecified asthma, uncomplicated: Secondary | ICD-10-CM | POA: Diagnosis not present

## 2018-02-25 DIAGNOSIS — Z471 Aftercare following joint replacement surgery: Secondary | ICD-10-CM | POA: Diagnosis not present

## 2018-02-25 DIAGNOSIS — N183 Chronic kidney disease, stage 3 (moderate): Secondary | ICD-10-CM | POA: Diagnosis not present

## 2018-02-26 DIAGNOSIS — I129 Hypertensive chronic kidney disease with stage 1 through stage 4 chronic kidney disease, or unspecified chronic kidney disease: Secondary | ICD-10-CM | POA: Diagnosis not present

## 2018-02-26 DIAGNOSIS — M5116 Intervertebral disc disorders with radiculopathy, lumbar region: Secondary | ICD-10-CM | POA: Diagnosis not present

## 2018-02-26 DIAGNOSIS — Z471 Aftercare following joint replacement surgery: Secondary | ICD-10-CM | POA: Diagnosis not present

## 2018-02-26 DIAGNOSIS — J45909 Unspecified asthma, uncomplicated: Secondary | ICD-10-CM | POA: Diagnosis not present

## 2018-02-26 DIAGNOSIS — N183 Chronic kidney disease, stage 3 (moderate): Secondary | ICD-10-CM | POA: Diagnosis not present

## 2018-02-26 DIAGNOSIS — E039 Hypothyroidism, unspecified: Secondary | ICD-10-CM | POA: Diagnosis not present

## 2018-03-01 ENCOUNTER — Ambulatory Visit (INDEPENDENT_AMBULATORY_CARE_PROVIDER_SITE_OTHER): Payer: Medicare Other

## 2018-03-01 VITALS — BP 142/72 | HR 78 | Temp 98.5°F | Resp 18 | Ht 71.0 in | Wt 260.8 lb

## 2018-03-01 DIAGNOSIS — Z780 Asymptomatic menopausal state: Secondary | ICD-10-CM | POA: Diagnosis not present

## 2018-03-01 DIAGNOSIS — Z1231 Encounter for screening mammogram for malignant neoplasm of breast: Secondary | ICD-10-CM | POA: Diagnosis not present

## 2018-03-01 DIAGNOSIS — Z0000 Encounter for general adult medical examination without abnormal findings: Secondary | ICD-10-CM | POA: Diagnosis not present

## 2018-03-01 NOTE — Patient Instructions (Signed)
Michele Moore , Thank you for taking time to come for your Medicare Wellness Visit. I appreciate your ongoing commitment to your health goals. Please review the following plan we discussed and let me know if I can assist you in the future.   Screening recommendations/referrals: Colonoscopy: done 10/12/17 Mammogram: Please call [phone] to schedule your mammogram and bone density exam. Recommended yearly ophthalmology/optometry visit for glaucoma screening and checkup Recommended yearly dental visit for hygiene and checkup  Vaccinations: Influenza vaccine: postponed Pneumococcal vaccine: done 12/23/16 Tdap vaccine: due - please contact us if you get a cut or scrape Shingles vaccine: Shingrix discussed. Please contact your pharmacy for coverage information.     Advanced directives: Advance directive discussed with you today. I have provided a copy for you to complete at home and have notarized. Once this is complete please bring a copy in to our office so we can scan it into your chart.  Conditions/risks identified: Recommend increasing physical activity as tolerated.   Next appointment: Please follow up in one year for your Medicare Annual Wellness visit.     Preventive Care 70 Years and Older, Female Preventive care refers to lifestyle choices and visits with your health care provider that can promote health and wellness. What does preventive care include?  A yearly physical exam. This is also called an annual well check.  Dental exams once or twice a year.  Routine eye exams. Ask your health care provider how often you should have your eyes checked.  Personal lifestyle choices, including:  Daily care of your teeth and gums.  Regular physical activity.  Eating a healthy diet.  Avoiding tobacco and drug use.  Limiting alcohol use.  Practicing safe sex.  Taking low-dose aspirin every day.  Taking vitamin and mineral supplements as recommended by your health care  provider. What happens during an annual well check? The services and screenings done by your health care provider during your annual well check will depend on your age, overall health, lifestyle risk factors, and family history of disease. Counseling  Your health care provider may ask you questions about your:  Alcohol use.  Tobacco use.  Drug use.  Emotional well-being.  Home and relationship well-being.  Sexual activity.  Eating habits.  History of falls.  Memory and ability to understand (cognition).  Work and work Statistician.  Reproductive health. Screening  You may have the following tests or measurements:  Height, weight, and BMI.  Blood pressure.  Lipid and cholesterol levels. These may be checked every 5 years, or more frequently if you are over 59 years old.  Skin check.  Lung cancer screening. You may have this screening every year starting at age 70 if you have a 30-pack-year history of smoking and currently smoke or have quit within the past 15 years.  Fecal occult blood test (FOBT) of the stool. You may have this test every year starting at age 70.  Flexible sigmoidoscopy or colonoscopy. You may have a sigmoidoscopy every 5 years or a colonoscopy every 10 years starting at age 35.  Hepatitis C blood test.  Hepatitis B blood test.  Sexually transmitted disease (STD) testing.  Diabetes screening. This is done by checking your blood sugar (glucose) after you have not eaten for a while (fasting). You may have this done every 1-3 years.  Bone density scan. This is done to screen for osteoporosis. You may have this done starting at age 70.  Mammogram. This may be done every 1-2 years. Talk to  your health care provider about how often you should have regular mammograms. Talk with your health care provider about your test results, treatment options, and if necessary, the need for more tests. Vaccines  Your health care provider may recommend certain  vaccines, such as:  Influenza vaccine. This is recommended every year.  Tetanus, diphtheria, and acellular pertussis (Tdap, Td) vaccine. You may need a Td booster every 10 years.  Zoster vaccine. You may need this after age 50.  Pneumococcal 13-valent conjugate (PCV13) vaccine. One dose is recommended after age 70.  Pneumococcal polysaccharide (PPSV23) vaccine. One dose is recommended after age 70. Talk to your health care provider about which screenings and vaccines you need and how often you need them. This information is not intended to replace advice given to you by your health care provider. Make sure you discuss any questions you have with your health care provider. Document Released: 03/09/2015 Document Revised: 10/31/2015 Document Reviewed: 12/12/2014 Elsevier Interactive Patient Education  2017 [employer] Prevention in the Home Falls can cause injuries. They can happen to people of all ages. There are many things you can do to make your home safe and to help prevent falls. What can I do on the outside of my home?  Regularly fix the edges of walkways and driveways and fix any cracks.  Remove anything that might make you trip as you walk through a door, such as a raised step or threshold.  Trim any bushes or trees on the path to your home.  Use bright outdoor lighting.  Clear any walking paths of anything that might make someone trip, such as rocks or tools.  Regularly check to see if handrails are loose or broken. Make sure that both sides of any steps have handrails.  Any raised decks and porches should have guardrails on the edges.  Have any leaves, snow, or ice cleared regularly.  Use sand or salt on walking paths during winter.  Clean up any spills in your garage right away. This includes oil or grease spills. What can I do in the bathroom?  Use night lights.  Install grab bars by the toilet and in the tub and shower. Do not use towel bars as grab  bars.  Use non-skid mats or decals in the tub or shower.  If you need to sit down in the shower, use a plastic, non-slip stool.  Keep the floor dry. Clean up any water that spills on the floor as soon as it happens.  Remove soap buildup in the tub or shower regularly.  Attach bath mats securely with double-sided non-slip rug tape.  Do not have throw rugs and other things on the floor that can make you trip. What can I do in the bedroom?  Use night lights.  Make sure that you have a light by your bed that is easy to reach.  Do not use any sheets or blankets that are too big for your bed. They should not hang down onto the floor.  Have a firm chair that has side arms. You can use this for support while you get dressed.  Do not have throw rugs and other things on the floor that can make you trip. What can I do in the kitchen?  Clean up any spills right away.  Avoid walking on wet floors.  Keep items that you use a lot in easy-to-reach places.  If you need to reach something above you, use a strong step stool that has  a grab bar.  Keep electrical cords out of the way.  Do not use floor polish or wax that makes floors slippery. If you must use wax, use non-skid floor wax.  Do not have throw rugs and other things on the floor that can make you trip. What can I do with my stairs?  Do not leave any items on the stairs.  Make sure that there are handrails on both sides of the stairs and use them. Fix handrails that are broken or loose. Make sure that handrails are as long as the stairways.  Check any carpeting to make sure that it is firmly attached to the stairs. Fix any carpet that is loose or worn.  Avoid having throw rugs at the top or bottom of the stairs. If you do have throw rugs, attach them to the floor with carpet tape.  Make sure that you have a light switch at the top of the stairs and the bottom of the stairs. If you do not have them, ask someone to add them for  you. What else can I do to help prevent falls?  Wear shoes that:  Do not have high heels.  Have rubber bottoms.  Are comfortable and fit you well.  Are closed at the toe. Do not wear sandals.  If you use a stepladder:  Make sure that it is fully opened. Do not climb a closed stepladder.  Make sure that both sides of the stepladder are locked into place.  Ask someone to hold it for you, if possible.  Clearly mark and make sure that you can see:  Any grab bars or handrails.  First and last steps.  Where the edge of each step is.  Use tools that help you move around (mobility aids) if they are needed. These include:  Canes.  Walkers.  Scooters.  Crutches.  Turn on the lights when you go into a dark area. Replace any light bulbs as soon as they burn out.  Set up your furniture so you have a clear path. Avoid moving your furniture around.  If any of your floors are uneven, fix them.  If there are any pets around you, be aware of where they are.  Review your medicines with your doctor. Some medicines can make you feel dizzy. This can increase your chance of falling. Ask your doctor what other things that you can do to help prevent falls. This information is not intended to replace advice given to you by your health care provider. Make sure you discuss any questions you have with your health care provider. Document Released: 12/07/2008 Document Revised: 07/19/2015 Document Reviewed: 03/17/2014 Elsevier Interactive Patient Education  2017 [employer].

## 2018-03-01 NOTE — Progress Notes (Signed)
Subjective:   Michele Moore is a 68 y.o. female who presents for an Initial Medicare Annual Wellness Visit.  Review of Systems     Cardiac Risk Factors include: advanced age (>23men, >54 women);hypertension;obesity (BMI >30kg/m2);dyslipidemia     Objective:    Today's Vitals   03/01/18 1544 03/01/18 1548  BP: (!) 142/72   Pulse: 78   Resp: 18   Temp: 98.5 F (36.9 C)   TempSrc: Oral   SpO2: 98%   Weight: 260 lb 12.8 oz (118.3 kg)   Height: 5\' 11"  (1.803 m)   PainSc:  8    Body mass index is 36.37 kg/m.  Advanced Directives 03/01/2018 01/20/2018 12/29/2017 12/16/2017 11/05/2017 10/22/2017 10/19/2017  Does Patient Have a Medical Advance Directive? No No No No No No No  Would patient like information on creating a medical advance directive? Yes (MAU/Ambulatory/Procedural Areas - Information given) No - Patient declined Yes (MAU/Ambulatory/Procedural Areas - Information given) No - Patient declined No - Patient declined Yes (MAU/Ambulatory/Procedural Areas - Information given) No - Patient declined    Current Medications (verified) Outpatient Encounter Medications as of 03/01/2018  Medication Sig  . albuterol (PROVENTIL HFA;VENTOLIN HFA) 108 (90 Base) MCG/ACT inhaler Inhale 2 puffs into the lungs every 4 (four) hours as needed for wheezing.  Marland Kitchen allopurinol (ZYLOPRIM) 100 MG tablet Take 2 tablets by mouth daily. Dr Barb Merino  . Cholecalciferol (VITAMIN D3) 2000 units TABS Take 2,000 Units by mouth daily.  . diclofenac sodium (VOLTAREN) 1 % GEL Apply 2 g topically 3 (three) times daily. [facility]  . ferrous sulfate 325 (65 FE) MG tablet Take 325 mg by mouth daily with breakfast. otc  . fluticasone (FLONASE) 50 MCG/ACT nasal spray SHAKE LIQUID AND USE 1 SPRAY IN EACH NOSTRIL DAILY  . gabapentin (NEURONTIN) 300 MG capsule Take 2 capsules (600 mg total) by mouth at bedtime.  . hydrALAZINE (APRESOLINE) 50 MG tablet Take 1 tablet (50 mg total) by mouth 2 (two) times daily.  Marland Kitchen  HYDROcodone-acetaminophen (NORCO) 7.5-325 MG tablet Take 1 tablet by mouth 3 (three) times daily as needed for severe pain. For Chronic Pain DNF: 12/29/2017, 01/27/2018, 02/26/2018  . ketoconazole (NIZORAL) 2 % cream U UTD QHS (Patient taking differently: Apply 1 application topically at bedtime. U UTD QHS)  . levothyroxine (SYNTHROID) 200 MCG tablet Take 1 tablet (200 mcg total) by mouth daily before breakfast.  . meclizine (ANTIVERT) 12.5 MG tablet Take 1 tablet (12.5 mg total) by mouth 2 (two) times daily as needed for dizziness.  . Multiple Vitamins-Calcium (ONE-A-DAY WOMENS FORMULA PO) Take 1 tablet by mouth daily.  Marland Kitchen nystatin cream (MYCOSTATIN) APPLY EXTERNALLY TO THE AFFECTED AREA TWICE DAILY  . omega-3 acid ethyl esters (LOVAZA) 1 g capsule TAKE 2 CAPSULES BY MOUTH TWICE DAILY  . omeprazole (PRILOSEC) 40 MG capsule Take 1 capsule (40 mg total) by mouth daily.  Marland Kitchen oxyCODONE (OXY IR/ROXICODONE) 5 MG immediate release tablet Take 1 tablet (5 mg total) by mouth every 4 (four) hours as needed for moderate pain (pain score 4-6).  Marland Kitchen polyethylene glycol (MIRALAX / GLYCOLAX) packet Take 17 g by mouth daily. (Patient taking differently: Take 17 g by mouth daily as needed for mild constipation. )  . potassium chloride SA (K-DUR,KLOR-CON) 20 MEQ tablet Take 0.5 tablets (10 mEq total) by mouth daily.  Marland Kitchen Propylene Glycol (SYSTANE BALANCE OP) Apply 1 drop to eye 2 (two) times daily.  . ranitidine (ZANTAC) 150 MG tablet Take 1 tablet (150  mg total) by mouth 2 (two) times daily.  Marland Kitchen tiZANidine (ZANAFLEX) 4 MG capsule Take 1 capsule (4 mg total) by mouth 2 (two) times daily as needed.  . triamcinolone cream (KENALOG) 0.1 % Apply 1 application topically every morning.  . triamterene-hydrochlorothiazide (MAXZIDE) 75-50 MG tablet Take 1 tablet by mouth daily.  . celecoxib (CELEBREX) 200 MG capsule Take 1 capsule (200 mg total) by mouth 2 (two) times daily. (Patient not taking: Reported on 03/01/2018)  . Polyethyl  Glycol-Propyl Glycol (SYSTANE OP) Place 1 drop into both eyes daily as needed (dry eyes).   . [DISCONTINUED] enoxaparin (LOVENOX) 40 MG/0.4ML injection Inject 0.4 mLs (40 mg total) into the skin daily. (Patient not taking: Reported on 03/01/2018)  . [DISCONTINUED] hydrALAZINE (APRESOLINE) 50 MG tablet TAKE 1 TABLET(50 MG) BY MOUTH TWICE DAILY  . [DISCONTINUED] traMADol (ULTRAM) 50 MG tablet Take 1-2 tablets (50-100 mg total) by mouth every 4 (four) hours as needed for moderate pain. (Patient not taking: Reported on 03/01/2018)   No facility-administered encounter medications on file as of 03/01/2018.     Allergies (verified) Ampicillin; Penicillins; and Vibramycin [doxycycline calcium]   History: Past Medical History:  Diagnosis Date  . Allergy   . Anemia   . Arthritis   . Asthma   . Chronic kidney disease    STAGE 3 PER DR EASON 08/02/15  . Collagen vascular disease ([place])   . Family history of adverse reaction to anesthesia    sister difficult to put to sleep  . GERD (gastroesophageal reflux disease)   . H/O tooth extraction    all lower teeth 1/19  . Hemorrhoid   . History of hiatal hernia   . Hypertension   . Hypothyroidism   . Migraines   . Mixed hyperlipidemia   . Multiple gastric ulcers   . Thyroid disease   . Wears dentures    partial upper and lower   Past Surgical History:  Procedure Laterality Date  . ANKLE SURGERY    . CARPAL TUNNEL RELEASE     x3  . COLONOSCOPY  2000?  . COLONOSCOPY WITH PROPOFOL N/A 10/22/2017   Procedure: COLONOSCOPY WITH PROPOFOL;  Surgeon: Lucilla Lame, MD;  Location: [place];  Service: Endoscopy;  Laterality: N/A;  . ESOPHAGOGASTRODUODENOSCOPY (EGD) WITH PROPOFOL N/A 10/22/2017   Procedure: ESOPHAGOGASTRODUODENOSCOPY (EGD) WITH PROPOFOL;  Surgeon: Lucilla Lame, MD;  Location: [facility];  Service: Endoscopy;  Laterality: N/A;  . FUSION OF TALONAVICULAR JOINT Right 08/03/2015   Procedure: TAILOR NAVICULAR JOINT FUSION - RIGHT ;   Surgeon: [name], DPM;  Location: [facility];  Service: Podiatry;  Laterality: Right;  . JOINT REPLACEMENT     knee x 3 ,right x1 and left x2  . POLYPECTOMY N/A 10/22/2017   Procedure: POLYPECTOMY;  Surgeon: Lucilla Lame, MD;  Location: [place];  Service: Endoscopy;  Laterality: N/A;  . ROTATOR CUFF REPAIR    . TONSILLECTOMY    . TOTAL KNEE REVISION Right 01/20/2018   Procedure: POLYETHYLENE EXCHANGE;  Surgeon: Dereck Leep, MD;  Location: [facility];  Service: Orthopedics;  Laterality: Right;  . TUBAL LIGATION    . UPPER GI ENDOSCOPY  2000?   Family History  Problem Relation Age of Onset  . COPD Sister   . Cancer Maternal Aunt        breast  . Cancer Maternal Uncle        kidney   Social History   Socioeconomic History  . Marital status: Divorced  Spouse name: Not on file  . Number of children: 4  . Years of education: Not on file  . Highest education level: 9th grade  Occupational History  . Occupation: retired  Scientific laboratory technician  . Financial resource strain: Not very hard  . Food insecurity:    Worry: Never true    Inability: Never true  . Transportation needs:    Medical: No    Non-medical: No  Tobacco Use  . Smoking status: Former Smoker    Last attempt to quit: 02/24/1993    Years since quitting: 25.0  . Smokeless tobacco: Never Used  Substance and Sexual Activity  . Alcohol use: No    Alcohol/week: 0.0 standard drinks  . Drug use: No  . Sexual activity: Not Currently  Lifestyle  . Physical activity:    Days per week: 0 days    Minutes per session: 0 min  . Stress: To some extent  Relationships  . Social connections:    Talks on phone: More than three times a week    Gets together: More than three times a week    Attends religious service: More than 4 times per year    Active member of club or organization: No    Attends meetings of clubs or organizations: Never    Relationship status: Divorced  Other Topics Concern  . Not on file    Social History Narrative  . Not on file    Tobacco Counseling Counseling given: Not Answered   Clinical Intake:  Pre-visit preparation completed: Yes  Pain : 0-10 Pain Score: 8  Pain Type: Chronic pain Pain Location: Knee Pain Orientation: Right Pain Descriptors / Indicators: Aching Pain Onset: More than a month ago Pain Frequency: Constant Pain Relieving Factors: ice, pain meds  Pain Relieving Factors: ice, pain meds  Nutritional Status: BMI > 30  Obese Nutritional Risks: None Diabetes: No  How often do you need to have someone help you when you read instructions, pamphlets, or other written materials from your doctor or pharmacy?: 1 - Never What is the last grade level you completed in school?: 9th grade  Interpreter Needed?: No  Information entered by :: [name] LPN   Activities of Daily Living In your present state of health, do you have any difficulty performing the following activities: 03/01/2018 01/20/2018  Hearing? N -  Vision? N -  Difficulty concentrating or making decisions? N -  Walking or climbing stairs? Y -  Dressing or bathing? N -  Doing errands, shopping? N N  Preparing Food and eating ? N -  Using the Toilet? N -  In the past six months, have you accidently leaked urine? N -  Do you have problems with loss of bowel control? N -  Managing your Medications? N -  Managing your Finances? N -  Housekeeping or managing your Housekeeping? N -  Some recent data might be hidden     Immunizations and Health Maintenance Immunization History  Administered Date(s) Administered  . Influenza, High Dose Seasonal PF 12/23/2016  . Influenza-Unspecified 11/07/2014, 12/23/2016  . Pneumococcal Conjugate-13 11/07/2014  . Pneumococcal Polysaccharide-23 12/23/2016  . Zoster 11/07/2014   Health Maintenance Due  Topic Date Due  . Hepatitis C Screening  1950/07/17  . MAMMOGRAM  10/05/1998  . DEXA SCAN  10/04/2013    Patient Care Team: Juline Patch, MD as PCP - General (Family Medicine) Christene Lye, MD (General Surgery) Marden Noble, MD (Internal Medicine)  Indicate any  recent Medical Services you may have received from other than [facility] providers in the past year (date may be approximate).     Assessment:   This is a routine wellness examination for Michele Moore.  Hearing/Vision screen Hearing Screening Comments: Pt has no difficulty hearing Vision Screening Comments: Annual vision screenings done by Dr. Gwynn Burly  Dietary issues and exercise activities discussed: Current Exercise Habits: The patient does not participate in regular exercise at present, Exercise limited by: orthopedic condition(s)  Goals    . Increase physical activity     Increase physical activity as tolerated.      Depression Screen PHQ 2/9 Scores 03/01/2018 12/29/2017 12/16/2017 12/03/2017 10/19/2017 09/29/2017 08/31/2017  PHQ - 2 Score 0 0 0 0 0 0 0    Fall Risk Fall Risk  03/01/2018 12/29/2017 12/16/2017 12/03/2017 10/19/2017  Falls in the past year? 1 0 No No No  Number falls in past yr: 1 - - - -  Injury with Fall? 0 - - - -  Comment - - - - -  Risk Factor Category  - - - - -  Risk for fall due to : History of fall(s);Impaired mobility - - - -  Risk for fall due to: Comment - - - - -  Follow up Falls evaluation completed;Falls prevention discussed - - - -   FALL RISK PREVENTION PERTAINING TO THE HOME:  Any stairs in or around the home WITH handrails? No  Home free of loose throw rugs in walkways, pet beds, electrical cords, etc? No  Adequate lighting in your home to reduce risk of falls? Yes   ASSISTIVE DEVICES UTILIZED TO PREVENT FALLS:  Life alert? No  Use of a cane, walker or w/c? Yes  Grab bars in the bathroom? Yes  Shower chair or bench in shower? Yes  Elevated toilet seat or a handicapped toilet? Yes   DME ORDERS:  DME order needed?  No   TIMED UP AND GO:  Was the test performed? Yes .  Length of time to ambulate 10 feet: 10  sec.   GAIT:  Appearance of gait:  Gait slow, steady and with the use of an assistive device.  Education: Fall risk prevention has been discussed.  Intervention(s) required? Yes  remove throw rugs   Cognitive Function:        Screening Tests Health Maintenance  Topic Date Due  . Hepatitis C Screening  1950-04-07  . MAMMOGRAM  10/05/1998  . DEXA SCAN  10/04/2013  . INFLUENZA VACCINE  05/25/2018 (Originally 09/24/2017)  . TETANUS/TDAP  02/25/2019 (Originally 10/05/1967)  . COLONOSCOPY  10/23/2022  . PNA vac Low Risk Adult  Completed    Qualifies for Shingles Vaccine? Yes  Zostavax completed 2016. Due for Shingrix. Education has been provided regarding the importance of this vaccine. Pt has been advised to call insurance company to determine out of pocket expense. Advised may also receive vaccine at local pharmacy or Health Dept. Verbalized acceptance and understanding.  Tdap: Although this vaccine is not a covered service during a Wellness Exam, does the patient still wish to receive this vaccine today?  No .  Education has been provided regarding the importance of this vaccine. Advised may receive this vaccine at local pharmacy or Health Dept. Aware to provide a copy of the vaccination record if obtained from local pharmacy or Health Dept. Verbalized acceptance and understanding.  Flu Vaccine: Due for Flu vaccine. Does the patient want to receive this vaccine today?  No . Education has  been provided regarding the importance of this vaccine but still declined. Advised may receive this vaccine at local pharmacy or Health Dept. Aware to provide a copy of the vaccination record if obtained from local pharmacy or Health Dept. Verbalized acceptance and understanding.  Pneumococcal Vaccine: Up to date    Cancer Screenings:  Colorectal Screening: Completed 10/22/17. Repeat every 5 years;   Mammogram: Ordered today. Pt provided with contact information and advised to call to schedule appt.     Bone Density:. Ordered today. Pt provided with contact information and advised to call to schedule appt.   Lung Cancer Screening: (Low Dose CT Chest recommended if Age 18-80 years, 30 pack-year currently smoking OR have quit w/in 15years.) does not qualify.    Additional Screening:  Hepatitis C Screening: does qualify; ordered today.  Vision Screening: Recommended annual ophthalmology exams for early detection of glaucoma and other disorders of the eye. Is the patient up to date with their annual eye exam?  Yes  Who is the provider or what is the name of the office in which the pt attends annual eye exams? Dr. Gwynn Burly  Dental Screening: Recommended annual dental exams for proper oral hygiene  Community Resource Referral:  CRR required this visit?  No      Plan:    I have personally reviewed and addressed the Medicare Annual Wellness questionnaire and have noted the following in the patient's chart:  A. Medical and social history B. Use of alcohol, tobacco or illicit drugs  C. Current medications and supplements D. Functional ability and status E.  Nutritional status F.  Physical activity G. Advance directives H. List of other physicians I.  Hospitalizations, surgeries, and ER visits in previous 12 months J.  [place] such as hearing and vision if needed, cognitive and depression L. Referrals and appointments   In addition, I have reviewed and discussed with patient certain preventive protocols, quality metrics, and best practice recommendations. A written personalized care plan for preventive services as well as general preventive health recommendations were provided to patient.   Signed,  [name], LPN Nurse Health Advisor   Nurse Notes:

## 2018-03-02 DIAGNOSIS — Z471 Aftercare following joint replacement surgery: Secondary | ICD-10-CM | POA: Diagnosis not present

## 2018-03-02 DIAGNOSIS — M5116 Intervertebral disc disorders with radiculopathy, lumbar region: Secondary | ICD-10-CM | POA: Diagnosis not present

## 2018-03-02 DIAGNOSIS — J45909 Unspecified asthma, uncomplicated: Secondary | ICD-10-CM | POA: Diagnosis not present

## 2018-03-02 DIAGNOSIS — N183 Chronic kidney disease, stage 3 (moderate): Secondary | ICD-10-CM | POA: Diagnosis not present

## 2018-03-02 DIAGNOSIS — E039 Hypothyroidism, unspecified: Secondary | ICD-10-CM | POA: Diagnosis not present

## 2018-03-02 DIAGNOSIS — I129 Hypertensive chronic kidney disease with stage 1 through stage 4 chronic kidney disease, or unspecified chronic kidney disease: Secondary | ICD-10-CM | POA: Diagnosis not present

## 2018-03-03 ENCOUNTER — Ambulatory Visit: Payer: Medicare Other | Admitting: Student in an Organized Health Care Education/Training Program

## 2018-03-03 DIAGNOSIS — E039 Hypothyroidism, unspecified: Secondary | ICD-10-CM | POA: Diagnosis not present

## 2018-03-03 DIAGNOSIS — N183 Chronic kidney disease, stage 3 (moderate): Secondary | ICD-10-CM | POA: Diagnosis not present

## 2018-03-03 DIAGNOSIS — M5116 Intervertebral disc disorders with radiculopathy, lumbar region: Secondary | ICD-10-CM | POA: Diagnosis not present

## 2018-03-03 DIAGNOSIS — I129 Hypertensive chronic kidney disease with stage 1 through stage 4 chronic kidney disease, or unspecified chronic kidney disease: Secondary | ICD-10-CM | POA: Diagnosis not present

## 2018-03-03 DIAGNOSIS — Z471 Aftercare following joint replacement surgery: Secondary | ICD-10-CM | POA: Diagnosis not present

## 2018-03-03 DIAGNOSIS — J45909 Unspecified asthma, uncomplicated: Secondary | ICD-10-CM | POA: Diagnosis not present

## 2018-03-04 DIAGNOSIS — D631 Anemia in chronic kidney disease: Secondary | ICD-10-CM | POA: Diagnosis not present

## 2018-03-04 DIAGNOSIS — L404 Guttate psoriasis: Secondary | ICD-10-CM | POA: Diagnosis not present

## 2018-03-04 DIAGNOSIS — I10 Essential (primary) hypertension: Secondary | ICD-10-CM | POA: Diagnosis not present

## 2018-03-04 DIAGNOSIS — N183 Chronic kidney disease, stage 3 (moderate): Secondary | ICD-10-CM | POA: Diagnosis not present

## 2018-03-04 DIAGNOSIS — B353 Tinea pedis: Secondary | ICD-10-CM | POA: Diagnosis not present

## 2018-03-04 DIAGNOSIS — B351 Tinea unguium: Secondary | ICD-10-CM | POA: Diagnosis not present

## 2018-03-04 DIAGNOSIS — N2581 Secondary hyperparathyroidism of renal origin: Secondary | ICD-10-CM | POA: Diagnosis not present

## 2018-03-05 DIAGNOSIS — M5116 Intervertebral disc disorders with radiculopathy, lumbar region: Secondary | ICD-10-CM | POA: Diagnosis not present

## 2018-03-05 DIAGNOSIS — E039 Hypothyroidism, unspecified: Secondary | ICD-10-CM | POA: Diagnosis not present

## 2018-03-05 DIAGNOSIS — J45909 Unspecified asthma, uncomplicated: Secondary | ICD-10-CM | POA: Diagnosis not present

## 2018-03-05 DIAGNOSIS — N183 Chronic kidney disease, stage 3 (moderate): Secondary | ICD-10-CM | POA: Diagnosis not present

## 2018-03-05 DIAGNOSIS — Z471 Aftercare following joint replacement surgery: Secondary | ICD-10-CM | POA: Diagnosis not present

## 2018-03-05 DIAGNOSIS — I129 Hypertensive chronic kidney disease with stage 1 through stage 4 chronic kidney disease, or unspecified chronic kidney disease: Secondary | ICD-10-CM | POA: Diagnosis not present

## 2018-03-09 DIAGNOSIS — Z471 Aftercare following joint replacement surgery: Secondary | ICD-10-CM | POA: Diagnosis not present

## 2018-03-09 DIAGNOSIS — E039 Hypothyroidism, unspecified: Secondary | ICD-10-CM | POA: Diagnosis not present

## 2018-03-09 DIAGNOSIS — N183 Chronic kidney disease, stage 3 (moderate): Secondary | ICD-10-CM | POA: Diagnosis not present

## 2018-03-09 DIAGNOSIS — M5116 Intervertebral disc disorders with radiculopathy, lumbar region: Secondary | ICD-10-CM | POA: Diagnosis not present

## 2018-03-09 DIAGNOSIS — J45909 Unspecified asthma, uncomplicated: Secondary | ICD-10-CM | POA: Diagnosis not present

## 2018-03-09 DIAGNOSIS — I129 Hypertensive chronic kidney disease with stage 1 through stage 4 chronic kidney disease, or unspecified chronic kidney disease: Secondary | ICD-10-CM | POA: Diagnosis not present

## 2018-03-11 DIAGNOSIS — G8929 Other chronic pain: Secondary | ICD-10-CM | POA: Diagnosis not present

## 2018-03-11 DIAGNOSIS — M25561 Pain in right knee: Secondary | ICD-10-CM | POA: Diagnosis not present

## 2018-03-12 ENCOUNTER — Other Ambulatory Visit: Payer: Self-pay | Admitting: Family Medicine

## 2018-03-12 DIAGNOSIS — E039 Hypothyroidism, unspecified: Secondary | ICD-10-CM | POA: Diagnosis not present

## 2018-03-12 DIAGNOSIS — Z471 Aftercare following joint replacement surgery: Secondary | ICD-10-CM | POA: Diagnosis not present

## 2018-03-12 DIAGNOSIS — M5116 Intervertebral disc disorders with radiculopathy, lumbar region: Secondary | ICD-10-CM | POA: Diagnosis not present

## 2018-03-12 DIAGNOSIS — I129 Hypertensive chronic kidney disease with stage 1 through stage 4 chronic kidney disease, or unspecified chronic kidney disease: Secondary | ICD-10-CM | POA: Diagnosis not present

## 2018-03-12 DIAGNOSIS — J45909 Unspecified asthma, uncomplicated: Secondary | ICD-10-CM | POA: Diagnosis not present

## 2018-03-12 DIAGNOSIS — N183 Chronic kidney disease, stage 3 (moderate): Secondary | ICD-10-CM | POA: Diagnosis not present

## 2018-03-14 ENCOUNTER — Other Ambulatory Visit: Payer: Self-pay | Admitting: Family Medicine

## 2018-03-14 DIAGNOSIS — I10 Essential (primary) hypertension: Secondary | ICD-10-CM

## 2018-03-27 ENCOUNTER — Other Ambulatory Visit: Payer: Self-pay | Admitting: Family Medicine

## 2018-03-27 DIAGNOSIS — K219 Gastro-esophageal reflux disease without esophagitis: Secondary | ICD-10-CM

## 2018-03-30 ENCOUNTER — Encounter: Payer: Self-pay | Admitting: Student in an Organized Health Care Education/Training Program

## 2018-03-30 ENCOUNTER — Other Ambulatory Visit: Payer: Self-pay

## 2018-03-30 ENCOUNTER — Ambulatory Visit
Payer: Medicare Other | Attending: Student in an Organized Health Care Education/Training Program | Admitting: Student in an Organized Health Care Education/Training Program

## 2018-03-30 VITALS — BP 130/94 | HR 54 | Temp 98.4°F | Resp 18 | Ht 71.0 in | Wt 246.0 lb

## 2018-03-30 DIAGNOSIS — M47812 Spondylosis without myelopathy or radiculopathy, cervical region: Secondary | ICD-10-CM

## 2018-03-30 DIAGNOSIS — G894 Chronic pain syndrome: Secondary | ICD-10-CM | POA: Insufficient documentation

## 2018-03-30 DIAGNOSIS — M5136 Other intervertebral disc degeneration, lumbar region: Secondary | ICD-10-CM | POA: Diagnosis not present

## 2018-03-30 DIAGNOSIS — M5416 Radiculopathy, lumbar region: Secondary | ICD-10-CM | POA: Diagnosis not present

## 2018-03-30 MED ORDER — HYDROCODONE-ACETAMINOPHEN 7.5-325 MG PO TABS: 1.0000 | ORAL_TABLET | Freq: Three times a day (TID) | ORAL | 0 refills | Status: DC | PRN

## 2018-03-30 NOTE — Progress Notes (Signed)
Patient's Name: Michele Moore  MRN: [id number]  Referring Provider: Juline Patch, MD  DOB: [birth year]  PCP: Juline Patch, MD  DOS: 03/30/2018  Note by: Gillis Santa, MD  Service setting: Ambulatory outpatient  Specialty: Interventional Pain Management  Location: [facility] (AMB) Pain Management Facility    Patient type: Established   Primary Reason(s) for Visit: Encounter for prescription drug management. (Level of risk: moderate)  CC: Neck Pain; Hip Pain (left); Knee Pain (right); and Back Pain (lower)  HPI  Michele Moore is a 68 y.o. year old, female patient, who comes today for a medication management evaluation. She has Pain, lower extremity; Lumbar radiculopathy; Lumbar degenerative disc disease; Chronic pain syndrome; Spinal stenosis, lumbar region, with neurogenic claudication; Cervical facet syndrome; Osteoarthritis of right shoulder due to rotator cuff injury; Sprain of anterior talofibular ligament of right ankle; Severe obesity (BMI 35.0-35.9 with comorbidity) ([place]); Bradycardia; Hypertension; Hypothyroidism; Gastroesophageal reflux disease; Hypokalemia; Mixed hyperlipidemia; Reactive airway disease; Iron deficiency anemia due to chronic blood loss; Heme + stool; Acute gastritis without hemorrhage; Gastric polyps; Benign neoplasm of ascending colon; Benign neoplasm of transverse colon; Polyp of sigmoid colon; Chronic gouty arthropathy without tophi; DOE (dyspnea on exertion); Encounter for long-term (current) use of high-risk medication; Female pelvic pain; Positive ANA (antinuclear antibody); SS-A antibody positive; and History of revision of total knee arthroplasty on their problem list. Her primarily concern today is the Neck Pain; Hip Pain (left); Knee Pain (right); and Back Pain (lower)  Pain Assessment: Location:   Neck and low back with radiation to left foot Radiating: to shoulders, bilat(lower back pain moves to left foot) Onset: More than a month ago Duration: Chronic pain Quality:  Constant, Sharp, Tingling Severity: 8 /10 (subjective, self-reported pain score)  Note: Reported level is inconsistent with clinical observations.                         When using our objective Pain Scale, levels between 6 and 10/10 are said to belong in an emergency room, as it progressively worsens from a 6/10, described as severely limiting, requiring emergency care not usually available at an outpatient pain management facility. At a 6/10 level, communication becomes difficult and requires great effort. Assistance to reach the emergency department may be required. Facial flushing and profuse sweating along with potentially dangerous increases in heart rate and blood pressure will be evident. Effect on ADL: limits daily activities; hard to rest at night Timing: Constant Modifying factors: meds BP: (!) 130/94  HR: (!) 54  Michele Moore was last scheduled for an appointment on 01/28/2018 for medication management. During today's appointment we reviewed Michele Moore's chronic pain status, as well as her outpatient medication regimen.  The patient  reports no history of drug use. Her body mass index is 34.31 kg/m.  Further details on both, my assessment(s), as well as the proposed treatment plan, please see below.  Controlled Substance Pharmacotherapy Assessment REMS (Risk Evaluation and Mitigation Strategy)   02/26/2018  2   12/29/2017  Hydrocodone-Acetamin 7.5-325  90.00 30 Bi Lat   [id number]   [place] (4612)   0  22.50 MME  Comm Ins   [place]    Rise Patience, RN  03/30/2018  1:36 PM  Signed Nursing Pain Medication Assessment:  Safety precautions to be maintained throughout the outpatient stay will include: orient to surroundings, keep bed in low position, maintain call bell within reach at all times, provide assistance with transfer out of bed  and ambulation.  Medication Inspection Compliance: Pill count conducted under aseptic conditions, in front of the patient. Neither the pills nor the bottle was removed  from the patient's sight at any time. Once count was completed pills were immediately returned to the patient in their original bottle.  Medication: Hydrocodone/APAP Pill/Patch Count: 21 of 90 pills remain Pill/Patch Appearance: Markings consistent with prescribed medication Bottle Appearance: Standard pharmacy container. Clearly labeled. Filled Date: 1 / 3 / 2020 Last Medication intake:  Today   Pharmacokinetics: Liberation and absorption (onset of action): WNL Distribution (time to peak effect): WNL Metabolism and excretion (duration of action): WNL         Pharmacodynamics: Desired effects: Analgesia: Michele Moore reports >50% benefit. Functional ability: Patient reports that medication allows her to accomplish basic ADLs Clinically meaningful improvement in function (CMIF): Sustained CMIF goals met Perceived effectiveness: Described as relatively effective, allowing for increase in activities of daily living (ADL) Undesirable effects: Side-effects or Adverse reactions: None reported Monitoring: [place] PMP: Online review of the past 64-monthperiod conducted. Compliant with practice rules and regulations Last UDS on record: Summary  Date Value Ref Range Status  10/14/2016 FINAL  Final    Comment:    ==================================================================== TOXASSURE COMP DRUG ANALYSIS,UR ==================================================================== Test                             Result       Flag       Units Drug Present and Declared for Prescription Verification   Gabapentin                     PRESENT      EXPECTED   Acetaminophen                  PRESENT      EXPECTED Drug Present not Declared for Prescription Verification   Dextrorphan/Levorphanol        PRESENT      UNEXPECTED    Dextrorphan is an expected metabolite of dextromethorphan, an    over-the-counter or prescription cough suppressant. Levorphanol    is a scheduled prescription medication.  Dextrorphan cannot be    distinguished from levorphanol by the method used for analysis. Drug Absent but Declared for Prescription Verification   Hydrocodone                    Not Detected UNEXPECTED ng/mg creat   Oxycodone                      Not Detected UNEXPECTED ng/mg creat   Ephedrine/Pseudoephedrine      Not Detected UNEXPECTED   Tramadol                       Not Detected UNEXPECTED   Pregabalin                     Not Detected UNEXPECTED   Tizanidine                     Not Detected UNEXPECTED    Tizanidine, as indicated in the declared medication list, is not    always detected even when used as directed.   Duloxetine                     Not Detected UNEXPECTED   Salicylate  Not Detected UNEXPECTED    Aspirin, as indicated in the declared medication list, is not    always detected even when used as directed.   Chlorpheniramine               Not Detected UNEXPECTED   Hydroxyzine                    Not Detected UNEXPECTED   Lidocaine                      Not Detected UNEXPECTED    Lidocaine, as indicated in the declared medication list, is not    always detected even when used as directed. ==================================================================== Test                      Result    Flag   Units      Ref Range   Creatinine              101              mg/dL      >=20 ==================================================================== Declared Medications:  The flagging and interpretation on this report are based on the  following declared medications.  Unexpected results may arise from  inaccuracies in the declared medications.  **Note: The testing scope of this panel includes these medications:  Chlorpheniramine (Tussionex)  Duloxetine  Gabapentin  Hydrocodone (Hydrocodone-Acetaminophen)  Hydrocodone (Tussionex)  Hydroxyzine  Oxycodone (Oxycodone Acetaminophen)  Pregabalin  Pseudoephedrine (Fexofenadine Pseudoephedrine)  Tramadol   **Note: The testing scope of this panel does not include small to  moderate amounts of these reported medications:  Acetaminophen  Acetaminophen (Hydrocodone-Acetaminophen)  Acetaminophen (Oxycodone Acetaminophen)  Aspirin  Lidocaine  Tizanidine  **Note: The testing scope of this panel does not include following  reported medications:  Cefuroxime axetil  Fexofenadine (Fexofenadine Pseudoephedrine)  Hydralazine  Hydrochlorothiazide (Triamterine-Hydrochlorthzide)  Ketoconazole  Levothyroxine  Meloxicam  Mupirocin  Nystatin  Omeprazole  Polyethylene Glycol  Polymyxin  Potassium  Prednisone  Ranitidine  Supplement (Omega-3)  Topical  Triamcinolone acetonide  Triamterene (Triamterine-Hydrochlorthzide)  Trimethoprim ==================================================================== For clinical consultation, please call [phone]. ====================================================================    UDS interpretation: Compliant          Medication Assessment Form: Reviewed. Patient indicates being compliant with therapy Treatment compliance: Compliant Risk Assessment Profile: Aberrant behavior: See prior evaluations. None observed or detected today Comorbid factors increasing risk of overdose: See prior notes. No additional risks detected today Opioid risk tool (ORT) (Total Score): 0 Personal History of Substance Abuse (SUD-Substance use disorder):  Alcohol: Negative  Illegal Drugs: Negative  Rx Drugs: Negative  ORT Risk Level calculation: Low Risk Risk of substance use disorder (SUD): Low Opioid Risk Tool - 03/30/18 1335      Family History of Substance Abuse   Alcohol  Negative    Illegal Drugs  Negative    Rx Drugs  Negative      Personal History of Substance Abuse   Alcohol  Negative    Illegal Drugs  Negative    Rx Drugs  Negative      Age   Age between 64-45 years   No      History of Preadolescent Sexual Abuse   History of Preadolescent  Sexual Abuse  Negative or Female      Psychological Disease   Psychological Disease  Negative    Depression  Negative      Total Score   Opioid Risk Tool  Scoring  0    Opioid Risk Interpretation  Low Risk      ORT Scoring interpretation table:  Score <3 = Low Risk for SUD  Score between 4-7 = Moderate Risk for SUD  Score >8 = High Risk for Opioid Abuse   Risk Mitigation Strategies:  Patient Counseling: Covered Patient-Prescriber Agreement (PPA): Present and active  Notification to other healthcare providers: Done  Pharmacologic Plan: No change in therapy, at this time.             Laboratory Chemistry  Inflammation Markers (CRP: Acute Phase) (ESR: Chronic Phase) Lab Results  Component Value Date   CRP <0.8 11/05/2017   ESRSEDRATE 52 (H) 11/05/2017                         Rheumatology Markers No results found for: RF, ANA, LABURIC, URICUR, LYMEIGGIGMAB, LYMEABIGMQN, HLAB27                      Renal Function Markers Lab Results  Component Value Date   BUN 36 (H) 11/05/2017   CREATININE 1.83 (H) 01/20/2018   BCR 15 10/01/2017   GFRAA 32 (L) 01/20/2018   GFRNONAA 28 (L) 01/20/2018                             Hepatic Function Markers Lab Results  Component Value Date   AST 23 11/05/2017   ALT 16 11/05/2017   ALBUMIN 4.2 11/05/2017   ALKPHOS 87 11/05/2017                        Electrolytes Lab Results  Component Value Date   NA 140 01/20/2018   K 3.9 01/20/2018   CL 109 11/05/2017   CALCIUM 9.9 11/05/2017   MG 2.2 12/27/2013   PHOS 3.0 10/01/2017                        Neuropathy Markers Lab Results  Component Value Date   HIV Non Reactive 08/25/2017                        CNS Tests No results found for: COLORCSF, APPEARCSF, RBCCOUNTCSF, WBCCSF, POLYSCSF, LYMPHSCSF, EOSCSF, PROTEINCSF, GLUCCSF, JCVIRUS, CSFOLI, IGGCSF                      Bone Pathology Markers No results found for: VD25OH, VD125OH2TOT, [id number], [id number], 25OHVITD1, 25OHVITD2,  25OHVITD3, TESTOFREE, TESTOSTERONE                       Coagulation Parameters Lab Results  Component Value Date   INR 1.00 01/20/2018   LABPROT 13.1 01/20/2018   APTT 34 11/05/2017   PLT 181 01/21/2018                        Cardiovascular Markers Lab Results  Component Value Date   CKTOTAL 154 12/27/2013   CKMB 0.8 12/27/2013   TROPONINI <0.03 09/07/2017   HGB 10.8 (L) 01/21/2018   HCT 33.5 (L) 01/21/2018                         CA Markers No results found for: CEA, CA125, LABCA2  Endocrine Markers Lab Results  Component Value Date   TSH 2.700 10/01/2017                        Note: Lab results reviewed.  Recent Diagnostic Imaging Results  DG Knee Right Port CLINICAL DATA:  Right total knee arthroplasty poly ethylene exchange.  EXAM: PORTABLE RIGHT KNEE - 1-2 VIEW  COMPARISON:  CT of the right knee 11/16/2015. Right knee radiographs 11/16/2015  FINDINGS: Right total knee arthroplasty is noted. New disc spacer is in place. There is gas and fluid in the joint. The knee is located. Femoral and tibial components are well seated.  IMPRESSION: Revision of right knee arthroplasty without radiographic evidence for complication.  Electronically Signed   By: San Morelle M.D.   On: 01/20/2018 11:00  Complexity Note: Imaging results reviewed. Results shared with Michele Moore, using Layman's terms.                         Meds   Current Outpatient Medications:  .  albuterol (PROVENTIL HFA;VENTOLIN HFA) 108 (90 Base) MCG/ACT inhaler, Inhale 2 puffs into the lungs every 4 (four) hours as needed for wheezing., Disp: 1 Inhaler, Rfl: 5 .  allopurinol (ZYLOPRIM) 100 MG tablet, Take 2 tablets by mouth daily. Dr Barb Merino, Disp: , Rfl:  .  Cholecalciferol (VITAMIN D3) 2000 units TABS, Take 2,000 Units by mouth daily., Disp: , Rfl:  .  diclofenac sodium (VOLTAREN) 1 % GEL, Apply 2 g topically 3 (three) times daily. [facility], Disp: ,  Rfl:  .  ferrous sulfate 325 (65 FE) MG tablet, Take 325 mg by mouth daily with breakfast. otc, Disp: , Rfl:  .  fluticasone (FLONASE) 50 MCG/ACT nasal spray, SHAKE LIQUID AND USE 1 SPRAY IN EACH NOSTRIL DAILY, Disp: 48 g, Rfl: 0 .  gabapentin (NEURONTIN) 300 MG capsule, Take 2 capsules (600 mg total) by mouth at bedtime., Disp: 60 capsule, Rfl: 4 .  hydrALAZINE (APRESOLINE) 50 MG tablet, Take 1 tablet (50 mg total) by mouth 2 (two) times daily., Disp: 60 tablet, Rfl: 1 .  [START ON 04/05/2018] HYDROcodone-acetaminophen (NORCO) 7.5-325 MG tablet, Take 1 tablet by mouth 3 (three) times daily as needed for up to 30 days for severe pain. For Chronic Pain, Disp: 90 tablet, Rfl: 0 .  ketoconazole (NIZORAL) 2 % cream, U UTD QHS (Patient taking differently: Apply 1 application topically at bedtime. U UTD QHS), Disp: 30 g, Rfl: 0 .  levothyroxine (SYNTHROID) 200 MCG tablet, Take 1 tablet (200 mcg total) by mouth daily before breakfast., Disp: 30 tablet, Rfl: 5 .  meclizine (ANTIVERT) 12.5 MG tablet, Take 1 tablet (12.5 mg total) by mouth 2 (two) times daily as needed for dizziness., Disp: 30 tablet, Rfl: 0 .  Multiple Vitamins-Calcium (ONE-A-DAY WOMENS FORMULA PO), Take 1 tablet by mouth daily., Disp: , Rfl:  .  nystatin cream (MYCOSTATIN), APPLY EXTERNALLY TO THE AFFECTED AREA TWICE DAILY, Disp: 30 g, Rfl: 0 .  omega-3 acid ethyl esters (LOVAZA) 1 g capsule, TAKE 2 CAPSULES BY MOUTH TWICE DAILY, Disp: 120 capsule, Rfl: 0 .  omeprazole (PRILOSEC) 40 MG capsule, Take 1 capsule (40 mg total) by mouth daily., Disp: 30 capsule, Rfl: 5 .  Polyethyl Glycol-Propyl Glycol (SYSTANE OP), Place 1 drop into both eyes daily as needed (dry eyes). , Disp: , Rfl:  .  potassium chloride SA (K-DUR,KLOR-CON) 20 MEQ tablet, Take 0.5 tablets (10 mEq  total) by mouth daily., Disp: 30 tablet, Rfl: 5 .  Propylene Glycol (SYSTANE BALANCE OP), Apply 1 drop to eye 2 (two) times daily., Disp: , Rfl:  .  ranitidine (ZANTAC) 150 MG tablet,  TAKE 1 TABLET(150 MG) BY MOUTH TWICE DAILY, Disp: 60 tablet, Rfl: 1 .  tiZANidine (ZANAFLEX) 4 MG capsule, Take 1 capsule (4 mg total) by mouth 2 (two) times daily as needed., Disp: 60 capsule, Rfl: 2 .  triamcinolone cream (KENALOG) 0.1 %, Apply 1 application topically every morning., Disp: , Rfl:  .  triamterene-hydrochlorothiazide (MAXZIDE) 75-50 MG tablet, TAKE 1 TABLET BY MOUTH DAILY, Disp: 30 tablet, Rfl: 0 .  [START ON 05/05/2018] HYDROcodone-acetaminophen (NORCO) 7.5-325 MG tablet, Take 1 tablet by mouth every 8 (eight) hours as needed for up to 30 days for moderate pain., Disp: 90 tablet, Rfl: 0  ROS  Constitutional: Denies any fever or chills Gastrointestinal: No reported hemesis, hematochezia, vomiting, or acute GI distress Musculoskeletal: Denies any acute onset joint swelling, redness, loss of ROM, or weakness Neurological: No reported episodes of acute onset apraxia, aphasia, dysarthria, agnosia, amnesia, paralysis, loss of coordination, or loss of consciousness  Allergies  Michele Moore is allergic to ampicillin; penicillins; and vibramycin [doxycycline calcium].  PFSH  Drug: Michele Moore  reports no history of drug use. Alcohol:  reports no history of alcohol use. Tobacco:  reports that she quit smoking about 25 years ago. She has never used smokeless tobacco. Medical:  has a past medical history of Allergy, Anemia, Arthritis, Asthma, Chronic kidney disease, Collagen vascular disease ([place]), Family history of adverse reaction to anesthesia, GERD (gastroesophageal reflux disease), H/O tooth extraction, Hemorrhoid, History of hiatal hernia, Hypertension, Hypothyroidism, Migraines, Mixed hyperlipidemia, Multiple gastric ulcers, Thyroid disease, and Wears dentures. Surgical: Michele Moore  has a past surgical history that includes Carpal tunnel release; Rotator cuff repair; Ankle surgery; Tubal ligation; Colonoscopy (2000?); Upper gi endoscopy (2000?); Tonsillectomy; Fusion of talonavicular joint  (Right, 08/03/2015); Colonoscopy with propofol (N/A, 10/22/2017); Esophagogastroduodenoscopy (egd) with propofol (N/A, 10/22/2017); polypectomy (N/A, 10/22/2017); Joint replacement; and Total knee revision (Right, 01/20/2018). Family: family history includes COPD in her sister; Cancer in her maternal aunt and maternal uncle.  Constitutional Exam  General appearance: Well nourished, well developed, and well hydrated. In no apparent acute distress Vitals:   03/30/18 1336  BP: (!) 130/94  Pulse: (!) 54  Resp: 18  Temp: 98.4 F (36.9 C)  TempSrc: Oral  SpO2: 98%  Weight: 246 lb (111.6 kg)  Height: _0  (1.803 m)   BMI Assessment: Estimated body mass index is 34.31 kg/m as calculated from the following:   Height as of this encounter: _1  (1.803 m).   Weight as of this encounter: 246 lb (111.6 kg).  BMI interpretation table: BMI level Category Range association with higher incidence of chronic pain  <18 kg/m2 Underweight   18.5-24.9 kg/m2 Ideal body weight   25-29.9 kg/m2 Overweight Increased incidence by 20%  30-34.9 kg/m2 Obese (Class I) Increased incidence by 68%  35-39.9 kg/m2 Severe obesity (Class II) Increased incidence by 136%  >40 kg/m2 Extreme obesity (Class III) Increased incidence by 254%   Patient's current BMI Ideal Body weight  Body mass index is 34.31 kg/m. Ideal body weight: 70.8 kg (156 lb 1.4 oz) Adjusted ideal body weight: 87.1 kg (192 lb 0.8 oz)   BMI Readings from Last 4 Encounters:  03/30/18 34.31 kg/m  03/01/18 36.37 kg/m  01/20/18 34.41 kg/m  12/29/17 34.31 kg/m   Wt Readings from Last  4 Encounters:  03/30/18 246 lb (111.6 kg)  03/01/18 260 lb 12.8 oz (118.3 kg)  01/20/18 246 lb 11.1 oz (111.9 kg)  12/29/17 246 lb (111.6 kg)  Psych/Mental status: Alert, oriented x 3 (person, place, & time)       Eyes: PERLA Respiratory: No evidence of acute respiratory distress  Cervical Spine Area Exam  Skin & Axial Inspection: No masses, redness, edema,  swelling, or associated skin lesions Alignment: Symmetrical Functional ROM: Unrestricted ROM      Stability: No instability detected Muscle Tone/Strength: Functionally intact. No obvious neuro-muscular anomalies detected. Sensory (Neurological): Unimpaired Palpation: No palpable anomalies              Upper Extremity (UE) Exam    Side: Right upper extremity  Side: Left upper extremity  Skin & Extremity Inspection: Skin color, temperature, and hair growth are WNL. No peripheral edema or cyanosis. No masses, redness, swelling, asymmetry, or associated skin lesions. No contractures.  Skin & Extremity Inspection: Skin color, temperature, and hair growth are WNL. No peripheral edema or cyanosis. No masses, redness, swelling, asymmetry, or associated skin lesions. No contractures.  Functional ROM: Unrestricted ROM          Functional ROM: Unrestricted ROM          Muscle Tone/Strength: Functionally intact. No obvious neuro-muscular anomalies detected.  Muscle Tone/Strength: Functionally intact. No obvious neuro-muscular anomalies detected.  Sensory (Neurological): Unimpaired          Sensory (Neurological): Unimpaired          Palpation: No palpable anomalies              Palpation: No palpable anomalies              Provocative Test(s):  Phalen's test: deferred Tinel's test: deferred Apley's scratch test (touch opposite shoulder):  Action 1 (Across chest): deferred Action 2 (Overhead): deferred Action 3 (LB reach): deferred   Provocative Test(s):  Phalen's test: deferred Tinel's test: deferred Apley's scratch test (touch opposite shoulder):  Action 1 (Across chest): deferred Action 2 (Overhead): deferred Action 3 (LB reach): deferred    Thoracic Spine Area Exam  Skin & Axial Inspection: No masses, redness, or swelling Alignment: Symmetrical Functional ROM: Unrestricted ROM Stability: No instability detected Muscle Tone/Strength: Functionally intact. No obvious neuro-muscular anomalies  detected. Sensory (Neurological): Unimpaired Muscle strength & Tone: No palpable anomalies  Lumbar Spine Area Exam  Skin & Axial Inspection: No masses, redness, or swelling Alignment: Symmetrical Functional ROM: Decreased ROM       Stability: No instability detected Muscle Tone/Strength: Functionally intact. No obvious neuro-muscular anomalies detected. Sensory (Neurological): Dermatomal pain pattern Palpation: Complains of area being tender to palpation       Provocative Tests: Hyperextension/rotation test: (+) bilaterally for facet joint pain. Lumbar quadrant test (Kemp's test): (+) due to pain. Lateral bending test: (+) ipsilateral radicular pain, bilaterally. Positive for bilateral foraminal stenosis. Patrick's Maneuver: deferred today                   FABER* test: deferred today                   S-I anterior distraction/compression test: deferred today         S-I lateral compression test: deferred today         S-I Thigh-thrust test: deferred today         S-I Gaenslen's test: deferred today         *(Flexion, ABduction and  External Rotation)  Gait & Posture Assessment  Ambulation: Limited Gait: Antalgic Posture: Lumbar lordosis   Lower Extremity Exam    Side: Right lower extremity  Side: Left lower extremity  Stability: No instability observed          Stability: No instability observed          Skin & Extremity Inspection: Evidence of prior arthroplastic surgery  Skin & Extremity Inspection: Skin color, temperature, and hair growth are WNL. No peripheral edema or cyanosis. No masses, redness, swelling, asymmetry, or associated skin lesions. No contractures.  Functional ROM: Decreased ROM for hip and knee joints          Functional ROM: Unrestricted ROM                  Muscle Tone/Strength: Functionally intact. No obvious neuro-muscular anomalies detected.  Muscle Tone/Strength: Functionally intact. No obvious neuro-muscular anomalies detected.  Sensory (Neurological):  Arthropathic arthralgia        Sensory (Neurological): Unimpaired        DTR: Patellar: deferred today Achilles: deferred today Plantar: deferred today  DTR: Patellar: deferred today Achilles: deferred today Plantar: deferred today  Palpation: No palpable anomalies  Palpation: No palpable anomalies   Assessment  Primary Diagnosis & Pertinent Problem List: The primary encounter diagnosis was Lumbar radiculopathy. Diagnoses of Lumbar degenerative disc disease, Cervical spondylosis without myelopathy, Cervical facet syndrome, and Chronic pain syndrome were also pertinent to this visit.  Status Diagnosis  Having a Flare-up Controlled Worsening 1. Lumbar radiculopathy   2. Lumbar degenerative disc disease   3. Cervical spondylosis without myelopathy   4. Cervical facet syndrome   5. Chronic pain syndrome      Patient follows up today for medication management as well as worsening low back pain with radiation to bilateral lower extremities.  Patient is status post right knee total arthroplasty on 01/20/2018.  She is noticing improved stability in her right knee after her surgery.  Patient is endorsing symptoms of lumbar radicular pain.  Her previous lumbar epidural steroid injection was performed in October 2019.  We discussed repeating L4-L5 ESI and patient would like to proceed.  Plan: -Refill of hydrocodone below for 2 months -Repeat L4-L5 ESI #3 -UDS today   Plan of Care  Pharmacotherapy (Medications Ordered): Meds ordered this encounter  Medications  . HYDROcodone-acetaminophen (NORCO) 7.5-325 MG tablet    Sig: Take 1 tablet by mouth 3 (three) times daily as needed for up to 30 days for severe pain. For Chronic Pain    Dispense:  90 tablet    Refill:  0  . HYDROcodone-acetaminophen (NORCO) 7.5-325 MG tablet    Sig: Take 1 tablet by mouth every 8 (eight) hours as needed for up to 30 days for moderate pain.    Dispense:  90 tablet    Refill:  0    Do not place this  medication, or any other prescription from our practice, on "Automatic Refill". Patient may have prescription filled one day early if pharmacy is closed on scheduled refill date.   Lab-work, procedure(s), and/or referral(s): Orders Placed This Encounter  Procedures  . Lumbar Epidural Injection  . ToxASSURE Select 13 (MW), Urine    Provider-requested follow-up: Return in about 4 weeks (around 04/27/2018) for Procedure.  Future Appointments  Date Time Provider [place]  05/27/2018  1:30 PM Gillis Santa, MD [facility] [place] None    Primary Care Physician: Juline Patch, MD Location: [facility] Note by: Carlus Pavlov  Holley Raring, M.D Date: 03/30/2018; Time: 3:26 PM  Patient Instructions   You have been handed 2 prescriptions for Hydrocodone today and you provided a urine    Epidural Steroid Injection An epidural steroid injection is given to relieve pain in your neck, back, or legs that is caused by the irritation or swelling of a nerve root. This procedure involves injecting a steroid and numbing medicine (anesthetic) into the epidural space. The epidural space is the space between the outer covering of your spinal cord and the bones that form your backbone (vertebra).  LET [facility] CARE PROVIDER KNOW ABOUT:   Any allergies you have.  All medicines you are taking, including vitamins, herbs, eye drops, creams, and over-the-counter medicines such as aspirin.  Previous problems you or members of your family have had with the use of anesthetics.  Any blood disorders or blood clotting disorders you have.  Previous surgeries you have had.  Medical conditions you have.  RISKS AND COMPLICATIONS Generally, this is a safe procedure. However, as with any procedure, complications can occur. Possible complications of epidural steroid injection include:  Headache.  Bleeding.  Infection.  Allergic reaction to the medicines.  Damage to your nerves. The response  to this procedure depends on the underlying cause of the pain and its duration. People who have long-term (chronic) pain are less likely to benefit from epidural steroids than are those people whose pain comes on strong and suddenly.  BEFORE THE PROCEDURE   Ask your health care provider about changing or stopping your regular medicines. You may be advised to stop taking blood-thinning medicines a few days before the procedure.  You may be given medicines to reduce anxiety.  Arrange for someone to take you home after the procedure.  PROCEDURE   You will remain awake during the procedure. You may receive medicine to make you relaxed.  You will be asked to lie on your stomach.  The injection site will be cleaned.  The injection site will be numbed with a medicine (local anesthetic).  A needle will be injected through your skin into the epidural space.  Your health care provider will use an X-ray machine to ensure that the steroid is delivered closest to the affected nerve. You may have minimal discomfort at this time.  Once the needle is in the right position, the local anesthetic and the steroid will be injected into the epidural space.  The needle will then be removed and a bandage will be applied to the injection site.  AFTER THE PROCEDURE   You may be monitored for a short time before you go home.  You may feel weakness or numbness in your arm or leg, which disappears within hours.  You may be allowed to eat, drink, and take your regular medicine.  You may have soreness at the site of the injection.   This information is not intended to replace advice given to you by your health care provider. Make sure you discuss any questions you have with your health care provider.   Document Released: 05/20/2007 Document Revised: 10/13/2012 Document Reviewed: 07/30/2012 Elsevier Interactive Patient Education 2016 [place]  What are the risk,  side effects and possible complications? Generally speaking, most procedures are safe.  However, with any procedure there are risks, side effects, and the possibility of complications.  The risks and complications are dependent upon the sites that are lesioned, or the type of nerve block to be performed.  The closer  the procedure is to the spine, the more serious the risks are.  Great care is taken when placing the radio frequency needles, block needles or lesioning probes, but sometimes complications can occur. Infection: Any time there is an injection through the skin, there is a risk of infection.  This is why sterile conditions are used for these blocks. There are four possible types of infection: 1. Localized skin infection. 2. Central Nervous System Infection: This can be in the form of Meningitis, which can be deadly. 3. Epidural Infections: This can be in the form of an epidural abscess, which can cause pressure inside of the spine, causing compression of the spinal cord with subsequent paralysis. This would require an emergency surgery to decompress, and there are no guarantees that the patient would recover from the paralysis. 4. Discitis: This is an infection of the intervertebral discs. It occurs in about 1% of discography procedures. It is difficult to treat and it may lead to surgery. Pain: the needles have to go through skin and soft tissues, will cause soreness. Damage to internal structures:  The nerves to be lesioned may be near blood vessels or other nerves which can be potentially damaged. Bleeding: Bleeding is more common if the patient is taking blood thinners such as  aspirin, Coumadin, Ticiid, Plavix, etc., or if he/she have some genetic predisposition such as hemophilia. Bleeding into the spinal canal can cause compression of the spinal  cord with subsequent paralysis.  This would require an emergency surgery to decompress and there are no guarantees that the patient would recover  from the paralysis. Pneumothorax: Puncturing of a lung is a possibility, every time a needle is introduced in the area of the chest or upper back.  Pneumothorax refers to free air around the collapsed lung(s), inside of the thoracic cavity (chest cavity).  Another two possible complications related to a similar event would include: Hemothorax and Chylothorax. These are variations of the Pneumothorax, where instead of air around the collapsed lung(s), you may have blood or chyle, respectively. Spinal headaches: They may occur with any procedures in the area of the spine. Persistent CSF (Cerebro-Spinal Fluid) leakage: This is a rare problem, but may occur with prolonged intrathecal or epidural catheters either due to the formation of a fistulous track or a dural tear. Nerve damage: By working so close to the spinal cord, there is always a possibility of nerve damage, which could be as serious as a permanent spinal cord injury with paralysis. Death: Although rare, severe deadly allergic reactions known as "Anaphylactic reaction" can occur to any of the medications used. Worsening of the symptoms: We can always make thing worse.  What are the chances of something like this happening? Chances of any of this occuring are extremely low.  By statistics, you have more of a chance of getting killed in a motor vehicle accident: while driving to the hospital than any of the above occurring .  Nevertheless, you should be aware that they are possibilities.  In general, it is similar to taking a shower.  Everybody knows that you can slip, hit your head and get killed.  Does that mean that you should not shower again?  Nevertheless always keep in mind that statistics do not mean anything if you happen to be on the wrong side of them.  Even if a procedure has a 1 (one) in a 1,000,000 (million) chance of going wrong, it you happen to be that one..Also, keep in mind that by statistics,  you have more of a chance of having  something go wrong when taking medications.  Who should not have this procedure? If you are on a blood thinning medication (e.g. Coumadin, Plavix, see list of "Blood Thinners"), or if you have an active infection going on, you should not have the procedure.  If you are taking any blood thinners, please inform your physician.  Preparing for your procedure: 1. Do not eat or drink anything at least eight (8) hours prior to the procedure. 2. Bring a driver with you .  It cannot be a taxi. 3. Come accompanied by an adult that can drive you back, and that is strong enough to help you if your legs get weak or numb from the local anesthetic. 4. Take all of your medicines the morning of the procedure with just enough water to swallow them. 5. If you have diabetes, make sure that you are scheduled to have your procedure done first thing in the morning, whenever possible. 6. If you have diabetes, take only half of your insulin dose and notify our nurse that you have done so as soon as you arrive at the clinic. 7. If you are diabetic, but only take blood sugar pills (oral hypoglycemic), then do not take them on the morning of your procedure.  You may take them after you have had the procedure. 8. Do not take aspirin or any aspirin-containing medications, at least eleven (11) days prior to the procedure.  They may prolong bleeding. 9. Wear loose fitting clothing that may be easy to take off and that you would not mind if it got stained with Betadine or blood. 10. Do not wear any jewelry or perfume 11. Remove any nail coloring.  It will interfere with some of our monitoring equipment. 12. If you take Metformin for your diabetes, stop it 48 hours prior to the procedure.  NOTE: Remember that this is not meant to be interpreted as a complete list of all possible complications.  Unforeseen problems may occur.  BLOOD THINNERS The following drugs contain aspirin or other products, which can cause increased  bleeding during surgery and should not be taken for 2 weeks prior to and 1 week after surgery.  If you should need take something for relief of minor pain, you may take acetaminophen which is found in Tylenol,m Datril, Anacin-3 and Panadol. It is not blood thinner. The products listed below are.  Do not take any of the products listed below in addition to any listed on your instruction sheet.  A.P.C or A.P.C with Codeine Codeine Phosphate Capsules #3 Ibuprofen Ridaura  ABC compound Congesprin Imuran rimadil  Advil Cope Indocin Robaxisal  Alka-Seltzer Effervescent Pain Reliever and Antacid Coricidin or Coricidin-D  Indomethacin Rufen  Alka-Seltzer plus Cold Medicine Cosprin Ketoprofen S-A-C Tablets  Anacin Analgesic Tablets or Capsules Coumadin Korlgesic Salflex  Anacin Extra Strength Analgesic tablets or capsules CP-2 Tablets Lanoril Salicylate  Anaprox Cuprimine Capsules Levenox Salocol  Anexsia-D Dalteparin Magan Salsalate  Anodynos Darvon compound Magnesium Salicylate Sine-off  Ansaid Dasin Capsules Magsal Sodium Salicylate  Anturane Depen Capsules Marnal Soma  APF Arthritis pain formula Dewitt's Pills Measurin Stanback  Argesic Dia-Gesic Meclofenamic Sulfinpyrazone  Arthritis Bayer Timed Release Aspirin Diclofenac Meclomen Sulindac  Arthritis pain formula Anacin Dicumarol Medipren Supac  Analgesic (Safety coated) Arthralgen Diffunasal Mefanamic Suprofen  Arthritis Strength Bufferin Dihydrocodeine Mepro Compound Suprol  Arthropan liquid Dopirydamole Methcarbomol with Aspirin Synalgos  ASA tablets/Enseals Disalcid Micrainin Tagament  Ascriptin Doan's Midol Talwin  Ascriptin A/D  Dolene Mobidin Tanderil  Ascriptin Extra Strength Dolobid Moblgesic Ticlid  Ascriptin with Codeine Doloprin or Doloprin with Codeine Momentum Tolectin  Asperbuf Duoprin Mono-gesic Trendar  Aspergum Duradyne Motrin or Motrin IB Triminicin  Aspirin plain, buffered or enteric coated Durasal Myochrisine Trigesic   Aspirin Suppositories Easprin Nalfon Trillsate  Aspirin with Codeine Ecotrin Regular or Extra Strength Naprosyn Uracel  Atromid-S Efficin Naproxen Ursinus  Auranofin Capsules Elmiron Neocylate Vanquish  Axotal Emagrin Norgesic Verin  Azathioprine Empirin or Empirin with Codeine Normiflo Vitamin E  Azolid Emprazil Nuprin Voltaren  Bayer Aspirin plain, buffered or children's or timed BC Tablets or powders Encaprin Orgaran Warfarin Sodium  Buff-a-Comp Enoxaparin Orudis Zorpin  Buff-a-Comp with Codeine Equegesic Os-Cal-Gesic   Buffaprin Excedrin plain, buffered or Extra Strength Oxalid   Bufferin Arthritis Strength Feldene Oxphenbutazone   Bufferin plain or Extra Strength Feldene Capsules Oxycodone with Aspirin   Bufferin with Codeine Fenoprofen Fenoprofen Pabalate or Pabalate-SF   Buffets II Flogesic Panagesic   Buffinol plain or Extra Strength Florinal or Florinal with Codeine Panwarfarin   Buf-Tabs Flurbiprofen Penicillamine   Butalbital Compound Four-way cold tablets Penicillin   Butazolidin Fragmin Pepto-Bismol   Carbenicillin Geminisyn Percodan   Carna Arthritis Reliever Geopen Persantine   Carprofen Gold's salt Persistin   Chloramphenicol Goody's Phenylbutazone   Chloromycetin Haltrain Piroxlcam   Clmetidine heparin Plaquenil   Cllnoril Hyco-pap Ponstel   Clofibrate Hydroxy chloroquine Propoxyphen         Before stopping any of these medications, be sure to consult the physician who ordered them.  Some, such as Coumadin (Warfarin) are ordered to prevent or treat serious conditions such as "deep thrombosis", "pumonary embolisms", and other heart problems.  The amount of time that you may need off of the medication may also vary with the medication and the reason for which you were taking it.  If you are taking any of these medications, please make sure you notify your pain physician before you undergo any procedures. specimen to [place]. Preparing for Procedure with  Sedation Instructions: . Oral Intake: Do not eat or drink anything for at least 8 hours prior to your procedure. . Transportation: Public transportation is not allowed. Bring an adult driver. The driver must be physically present in our waiting room before any procedure can be started. Marland Kitchen Physical Assistance: Bring an adult capable of physically assisting you, in the event you need help. . Blood Pressure Medicine: Take your blood pressure medicine with a sip of water the morning of the procedure. . Insulin: Take only  of your normal insulin dose. . Preventing infections: Shower with an antibacterial soap the morning of your procedure. . Build-up your immune system: Take 1000 mg of Vitamin C with every meal (3 times a day) the day prior to your procedure. . Pregnancy: If you are pregnant, call and cancel the procedure. . Sickness: If you have a cold, fever, or any active infections, call and cancel the procedure. . Arrival: You must be in the facility at least 30 minutes prior to your scheduled procedure. . Children: Do not bring children with you. . Dress appropriately: Bring dark clothing that you would not mind if they get stained. . Valuables: Do not bring any jewelry or valuables. Procedure appointments are reserved for interventional treatments only. Marland Kitchen No Prescription Refills. . No medication changes will be discussed during procedure appointments. No disability issues will be discussed.

## 2018-03-30 NOTE — Progress Notes (Signed)
Nursing Pain Medication Assessment:  Safety precautions to be maintained throughout the outpatient stay will include: orient to surroundings, keep bed in low position, maintain call bell within reach at all times, provide assistance with transfer out of bed and ambulation.  Medication Inspection Compliance: Pill count conducted under aseptic conditions, in front of the patient. Neither the pills nor the bottle was removed from the patient's sight at any time. Once count was completed pills were immediately returned to the patient in their original bottle.  Medication: Hydrocodone/APAP Pill/Patch Count: 21 of 90 pills remain Pill/Patch Appearance: Markings consistent with prescribed medication Bottle Appearance: Standard pharmacy container. Clearly labeled. Filled Date: 1 / 3 / 2020 Last Medication intake:  Today

## 2018-03-30 NOTE — Patient Instructions (Addendum)
You have been handed 2 prescriptions for Hydrocodone today and you provided a urine    Epidural Steroid Injection An epidural steroid injection is given to relieve pain in your neck, back, or legs that is caused by the irritation or swelling of a nerve root. This procedure involves injecting a steroid and numbing medicine (anesthetic) into the epidural space. The epidural space is the space between the outer covering of your spinal cord and the bones that form your backbone (vertebra).  LET [facility] CARE PROVIDER KNOW ABOUT:   Any allergies you have.  All medicines you are taking, including vitamins, herbs, eye drops, creams, and over-the-counter medicines such as aspirin.  Previous problems you or members of your family have had with the use of anesthetics.  Any blood disorders or blood clotting disorders you have.  Previous surgeries you have had.  Medical conditions you have.  RISKS AND COMPLICATIONS Generally, this is a safe procedure. However, as with any procedure, complications can occur. Possible complications of epidural steroid injection include:  Headache.  Bleeding.  Infection.  Allergic reaction to the medicines.  Damage to your nerves. The response to this procedure depends on the underlying cause of the pain and its duration. People who have long-term (chronic) pain are less likely to benefit from epidural steroids than are those people whose pain comes on strong and suddenly.  BEFORE THE PROCEDURE   Ask your health care provider about changing or stopping your regular medicines. You may be advised to stop taking blood-thinning medicines a few days before the procedure.  You may be given medicines to reduce anxiety.  Arrange for someone to take you home after the procedure.  PROCEDURE   You will remain awake during the procedure. You may receive medicine to make you relaxed.  You will be asked to lie on your stomach.  The injection site will be  cleaned.  The injection site will be numbed with a medicine (local anesthetic).  A needle will be injected through your skin into the epidural space.  Your health care provider will use an X-ray machine to ensure that the steroid is delivered closest to the affected nerve. You may have minimal discomfort at this time.  Once the needle is in the right position, the local anesthetic and the steroid will be injected into the epidural space.  The needle will then be removed and a bandage will be applied to the injection site.  AFTER THE PROCEDURE   You may be monitored for a short time before you go home.  You may feel weakness or numbness in your arm or leg, which disappears within hours.  You may be allowed to eat, drink, and take your regular medicine.  You may have soreness at the site of the injection.   This information is not intended to replace advice given to you by your health care provider. Make sure you discuss any questions you have with your health care provider.   Document Released: 05/20/2007 Document Revised: 10/13/2012 Document Reviewed: 07/30/2012 Elsevier Interactive Patient Education 2016 [place]  What are the risk, side effects and possible complications? Generally speaking, most procedures are safe.  However, with any procedure there are risks, side effects, and the possibility of complications.  The risks and complications are dependent upon the sites that are lesioned, or the type of nerve block to be performed.  The closer the procedure is to the spine, the more serious the risks are.  Great care is taken when placing the radio frequency needles, block needles or lesioning probes, but sometimes complications can occur. Infection: Any time there is an injection through the skin, there is a risk of infection.  This is why sterile conditions are used for these blocks. There are four possible types of infection: 1. Localized  skin infection. 2. Central Nervous System Infection: This can be in the form of Meningitis, which can be deadly. 3. Epidural Infections: This can be in the form of an epidural abscess, which can cause pressure inside of the spine, causing compression of the spinal cord with subsequent paralysis. This would require an emergency surgery to decompress, and there are no guarantees that the patient would recover from the paralysis. 4. Discitis: This is an infection of the intervertebral discs. It occurs in about 1% of discography procedures. It is difficult to treat and it may lead to surgery. Pain: the needles have to go through skin and soft tissues, will cause soreness. Damage to internal structures:  The nerves to be lesioned may be near blood vessels or other nerves which can be potentially damaged. Bleeding: Bleeding is more common if the patient is taking blood thinners such as  aspirin, Coumadin, Ticiid, Plavix, etc., or if he/she have some genetic predisposition such as hemophilia. Bleeding into the spinal canal can cause compression of the spinal  cord with subsequent paralysis.  This would require an emergency surgery to decompress and there are no guarantees that the patient would recover from the paralysis. Pneumothorax: Puncturing of a lung is a possibility, every time a needle is introduced in the area of the chest or upper back.  Pneumothorax refers to free air around the collapsed lung(s), inside of the thoracic cavity (chest cavity).  Another two possible complications related to a similar event would include: Hemothorax and Chylothorax. These are variations of the Pneumothorax, where instead of air around the collapsed lung(s), you may have blood or chyle, respectively. Spinal headaches: They may occur with any procedures in the area of the spine. Persistent CSF (Cerebro-Spinal Fluid) leakage: This is a rare problem, but may occur with prolonged intrathecal or epidural catheters either due to  the formation of a fistulous track or a dural tear. Nerve damage: By working so close to the spinal cord, there is always a possibility of nerve damage, which could be as serious as a permanent spinal cord injury with paralysis. Death: Although rare, severe deadly allergic reactions known as "Anaphylactic reaction" can occur to any of the medications used. Worsening of the symptoms: We can always make thing worse.  What are the chances of something like this happening? Chances of any of this occuring are extremely low.  By statistics, you have more of a chance of getting killed in a motor vehicle accident: while driving to the hospital than any of the above occurring .  Nevertheless, you should be aware that they are possibilities.  In general, it is similar to taking a shower.  Everybody knows that you can slip, hit your head and get killed.  Does that mean that you should not shower again?  Nevertheless always keep in mind that statistics do not mean anything if you happen to be on the wrong side of them.  Even if a procedure has a 1 (one) in a 1,000,000 (million) chance of going wrong, it you happen to be that one..Also, keep in mind that by statistics, you have more of a chance of having something go wrong when  taking medications.  Who should not have this procedure? If you are on a blood thinning medication (e.g. Coumadin, Plavix, see list of "Blood Thinners"), or if you have an active infection going on, you should not have the procedure.  If you are taking any blood thinners, please inform your physician.  Preparing for your procedure: 1. Do not eat or drink anything at least eight (8) hours prior to the procedure. 2. Bring a driver with you .  It cannot be a taxi. 3. Come accompanied by an adult that can drive you back, and that is strong enough to help you if your legs get weak or numb from the local anesthetic. 4. Take all of your medicines the morning of the procedure with just enough water  to swallow them. 5. If you have diabetes, make sure that you are scheduled to have your procedure done first thing in the morning, whenever possible. 6. If you have diabetes, take only half of your insulin dose and notify our nurse that you have done so as soon as you arrive at the clinic. 7. If you are diabetic, but only take blood sugar pills (oral hypoglycemic), then do not take them on the morning of your procedure.  You may take them after you have had the procedure. 8. Do not take aspirin or any aspirin-containing medications, at least eleven (11) days prior to the procedure.  They may prolong bleeding. 9. Wear loose fitting clothing that may be easy to take off and that you would not mind if it got stained with Betadine or blood. 10. Do not wear any jewelry or perfume 11. Remove any nail coloring.  It will interfere with some of our monitoring equipment. 12. If you take Metformin for your diabetes, stop it 48 hours prior to the procedure.  NOTE: Remember that this is not meant to be interpreted as a complete list of all possible complications.  Unforeseen problems may occur.  BLOOD THINNERS The following drugs contain aspirin or other products, which can cause increased bleeding during surgery and should not be taken for 2 weeks prior to and 1 week after surgery.  If you should need take something for relief of minor pain, you may take acetaminophen which is found in Tylenol,m Datril, Anacin-3 and Panadol. It is not blood thinner. The products listed below are.  Do not take any of the products listed below in addition to any listed on your instruction sheet.  A.P.C or A.P.C with Codeine Codeine Phosphate Capsules #3 Ibuprofen Ridaura  ABC compound Congesprin Imuran rimadil  Advil Cope Indocin Robaxisal  Alka-Seltzer Effervescent Pain Reliever and Antacid Coricidin or Coricidin-D  Indomethacin Rufen  Alka-Seltzer plus Cold Medicine Cosprin Ketoprofen S-A-C Tablets  Anacin Analgesic Tablets  or Capsules Coumadin Korlgesic Salflex  Anacin Extra Strength Analgesic tablets or capsules CP-2 Tablets Lanoril Salicylate  Anaprox Cuprimine Capsules Levenox Salocol  Anexsia-D Dalteparin Magan Salsalate  Anodynos Darvon compound Magnesium Salicylate Sine-off  Ansaid Dasin Capsules Magsal Sodium Salicylate  Anturane Depen Capsules Marnal Soma  APF Arthritis pain formula Dewitt's Pills Measurin Stanback  Argesic Dia-Gesic Meclofenamic Sulfinpyrazone  Arthritis Bayer Timed Release Aspirin Diclofenac Meclomen Sulindac  Arthritis pain formula Anacin Dicumarol Medipren Supac  Analgesic (Safety coated) Arthralgen Diffunasal Mefanamic Suprofen  Arthritis Strength Bufferin Dihydrocodeine Mepro Compound Suprol  Arthropan liquid Dopirydamole Methcarbomol with Aspirin Synalgos  ASA tablets/Enseals Disalcid Micrainin Tagament  Ascriptin Doan's Midol Talwin  Ascriptin A/D Dolene Mobidin Tanderil  Ascriptin Extra Strength Dolobid Moblgesic Ticlid  Ascriptin  with Codeine Doloprin or Doloprin with Codeine Momentum Tolectin  Asperbuf Duoprin Mono-gesic Trendar  Aspergum Duradyne Motrin or Motrin IB Triminicin  Aspirin plain, buffered or enteric coated Durasal Myochrisine Trigesic  Aspirin Suppositories Easprin Nalfon Trillsate  Aspirin with Codeine Ecotrin Regular or Extra Strength Naprosyn Uracel  Atromid-S Efficin Naproxen Ursinus  Auranofin Capsules Elmiron Neocylate Vanquish  Axotal Emagrin Norgesic Verin  Azathioprine Empirin or Empirin with Codeine Normiflo Vitamin E  Azolid Emprazil Nuprin Voltaren  Bayer Aspirin plain, buffered or children's or timed BC Tablets or powders Encaprin Orgaran Warfarin Sodium  Buff-a-Comp Enoxaparin Orudis Zorpin  Buff-a-Comp with Codeine Equegesic Os-Cal-Gesic   Buffaprin Excedrin plain, buffered or Extra Strength Oxalid   Bufferin Arthritis Strength Feldene Oxphenbutazone   Bufferin plain or Extra Strength Feldene Capsules Oxycodone with Aspirin   Bufferin  with Codeine Fenoprofen Fenoprofen Pabalate or Pabalate-SF   Buffets II Flogesic Panagesic   Buffinol plain or Extra Strength Florinal or Florinal with Codeine Panwarfarin   Buf-Tabs Flurbiprofen Penicillamine   Butalbital Compound Four-way cold tablets Penicillin   Butazolidin Fragmin Pepto-Bismol   Carbenicillin Geminisyn Percodan   Carna Arthritis Reliever Geopen Persantine   Carprofen Gold's salt Persistin   Chloramphenicol Goody's Phenylbutazone   Chloromycetin Haltrain Piroxlcam   Clmetidine heparin Plaquenil   Cllnoril Hyco-pap Ponstel   Clofibrate Hydroxy chloroquine Propoxyphen         Before stopping any of these medications, be sure to consult the physician who ordered them.  Some, such as Coumadin (Warfarin) are ordered to prevent or treat serious conditions such as "deep thrombosis", "pumonary embolisms", and other heart problems.  The amount of time that you may need off of the medication may also vary with the medication and the reason for which you were taking it.  If you are taking any of these medications, please make sure you notify your pain physician before you undergo any procedures. specimen to [place]. Preparing for Procedure with Sedation Instructions: . Oral Intake: Do not eat or drink anything for at least 8 hours prior to your procedure. . Transportation: Public transportation is not allowed. Bring an adult driver. The driver must be physically present in our waiting room before any procedure can be started. Marland Kitchen Physical Assistance: Bring an adult capable of physically assisting you, in the event you need help. . Blood Pressure Medicine: Take your blood pressure medicine with a sip of water the morning of the procedure. . Insulin: Take only  of your normal insulin dose. . Preventing infections: Shower with an antibacterial soap the morning of your procedure. . Build-up your immune system: Take 1000 mg of Vitamin C with every meal (3 times a day) the day prior to your  procedure. . Pregnancy: If you are pregnant, call and cancel the procedure. . Sickness: If you have a cold, fever, or any active infections, call and cancel the procedure. . Arrival: You must be in the facility at least 30 minutes prior to your scheduled procedure. . Children: Do not bring children with you. . Dress appropriately: Bring dark clothing that you would not mind if they get stained. . Valuables: Do not bring any jewelry or valuables. Procedure appointments are reserved for interventional treatments only. Marland Kitchen No Prescription Refills. . No medication changes will be discussed during procedure appointments. No disability issues will be discussed.

## 2018-04-02 LAB — TOXASSURE SELECT 13 (MW), URINE

## 2018-04-05 ENCOUNTER — Encounter: Payer: Self-pay | Admitting: Emergency Medicine

## 2018-04-05 ENCOUNTER — Other Ambulatory Visit: Payer: Self-pay

## 2018-04-05 ENCOUNTER — Ambulatory Visit
Admission: EM | Admit: 2018-04-05 | Discharge: 2018-04-05 | Disposition: A | Discharge status: Home / Self Care | Payer: Medicare Other | Attending: Family Medicine | Admitting: Family Medicine

## 2018-04-05 DIAGNOSIS — R21 Rash and other nonspecific skin eruption: Secondary | ICD-10-CM

## 2018-04-05 MED ORDER — CLOTRIMAZOLE 1 % EX CREA: TOPICAL_CREAM | CUTANEOUS | 0 refills | Status: DC

## 2018-04-05 MED ORDER — FLUCONAZOLE 150 MG PO TABS: 150.0000 mg | ORAL_TABLET | ORAL | 0 refills | Status: DC

## 2018-04-05 NOTE — ED Triage Notes (Signed)
Pt c/o pain, redness, and foul odor in her umbilical area, in the folds of her skin on her stomach and under her breast. Started yesterday.

## 2018-04-05 NOTE — ED Provider Notes (Signed)
[facility]    CSN: [id number] Arrival date & time: 04/05/18  1723  History   Chief Complaint Chief Complaint  Patient presents with  . Skin Problem   HPI   68 year old female presents with the above complaint.  Patient reports that she developed areas of redness, pain, and odor today.  Patient reports redness and pain around her umbilicus.  Also in her umbilicus.  She has similar areas of redness and pain underneath her pannus as well as underneath her breast..  She states that it "smells foul".  No medications or interventions tried.  No known exacerbating factors.  Patient has known psoriasis.  She is followed by dermatology.  No other associated symptoms.   PMH, Surgical Hx, Family Hx, Social History reviewed and updated as below.  Past Medical History:  Diagnosis Date  . Allergy   . Anemia   . Arthritis   . Asthma   . Chronic kidney disease    STAGE 3 PER DR EASON 08/02/15  . Collagen vascular disease ([place])   . Family history of adverse reaction to anesthesia    sister difficult to put to sleep  . GERD (gastroesophageal reflux disease)   . H/O tooth extraction    all lower teeth 1/19  . Hemorrhoid   . History of hiatal hernia   . Hypertension   . Hypothyroidism   . Migraines   . Mixed hyperlipidemia   . Multiple gastric ulcers   . Thyroid disease   . Wears dentures    partial upper and lower    Patient Active Problem List   Diagnosis Date Noted  . Female pelvic pain 01/20/2018  . History of revision of total knee arthroplasty 01/20/2018  . Iron deficiency anemia due to chronic blood loss   . Heme + stool   . Acute gastritis without hemorrhage   . Gastric polyps   . Benign neoplasm of ascending colon   . Benign neoplasm of transverse colon   . Polyp of sigmoid colon   . Hypertension 10/01/2017  . Hypothyroidism 10/01/2017  . Gastroesophageal reflux disease 10/01/2017  . Hypokalemia 10/01/2017  . Mixed hyperlipidemia 10/01/2017  . Reactive  airway disease 10/01/2017  . Bradycardia 08/24/2017  . Severe obesity (BMI 35.0-35.9 with comorbidity) ([place]) 08/18/2017  . Chronic gouty arthropathy without tophi 06/25/2017  . Encounter for long-term (current) use of high-risk medication 06/25/2017  . Positive ANA (antinuclear antibody) 06/17/2017  . Cervical facet syndrome 06/16/2017  . Osteoarthritis of right shoulder due to rotator cuff injury 06/16/2017  . Sprain of anterior talofibular ligament of right ankle 06/16/2017  . Lumbar radiculopathy 04/21/2017  . Lumbar degenerative disc disease 04/21/2017  . Chronic pain syndrome 04/21/2017  . Spinal stenosis, lumbar region, with neurogenic claudication 04/21/2017  . SS-A antibody positive 03/05/2017  . Pain, lower extremity 08/03/2015  . DOE (dyspnea on exertion) 12/28/2013    Past Surgical History:  Procedure Laterality Date  . ANKLE SURGERY    . CARPAL TUNNEL RELEASE     x3  . COLONOSCOPY  2000?  . COLONOSCOPY WITH PROPOFOL N/A 10/22/2017   Procedure: COLONOSCOPY WITH PROPOFOL;  Surgeon: Lucilla Lame, MD;  Location: [place];  Service: Endoscopy;  Laterality: N/A;  . ESOPHAGOGASTRODUODENOSCOPY (EGD) WITH PROPOFOL N/A 10/22/2017   Procedure: ESOPHAGOGASTRODUODENOSCOPY (EGD) WITH PROPOFOL;  Surgeon: Lucilla Lame, MD;  Location: [place];  Service: Endoscopy;  Laterality: N/A;  . FUSION OF TALONAVICULAR JOINT Right 08/03/2015   Procedure: TAILOR NAVICULAR JOINT FUSION -  RIGHT ;  Surgeon: [name], DPM;  Location: [facility];  Service: Podiatry;  Laterality: Right;  . JOINT REPLACEMENT     knee x 3 ,right x1 and left x2  . POLYPECTOMY N/A 10/22/2017   Procedure: POLYPECTOMY;  Surgeon: Lucilla Lame, MD;  Location: [place];  Service: Endoscopy;  Laterality: N/A;  . ROTATOR CUFF REPAIR    . TONSILLECTOMY    . TOTAL KNEE REVISION Right 01/20/2018   Procedure: POLYETHYLENE EXCHANGE;  Surgeon: Dereck Leep, MD;  Location: [facility];  Service:  Orthopedics;  Laterality: Right;  . TUBAL LIGATION    . UPPER GI ENDOSCOPY  2000?    OB History    Gravida  4   Para      Term      Preterm      AB      Living        SAB      TAB      Ectopic      Multiple      Live Births           Obstetric Comments  1st Menstrual Cycle:  16 1st Pregnancy:  19         Home Medications    Prior to Admission medications   Medication Sig Start Date End Date Taking? Authorizing Provider  albuterol (PROVENTIL HFA;VENTOLIN HFA) 108 (90 Base) MCG/ACT inhaler Inhale 2 puffs into the lungs every 4 (four) hours as needed for wheezing. 10/01/17  Yes Juline Patch, MD  allopurinol (ZYLOPRIM) 100 MG tablet Take 2 tablets by mouth daily. Dr Barb Merino 08/06/17  Yes [provider]  Cholecalciferol (VITAMIN D3) 2000 units TABS Take 2,000 Units by mouth daily.   Yes [provider]  diclofenac sodium (VOLTAREN) 1 % GEL Apply 2 g topically 3 (three) times daily. [facility] 08/11/17  Yes [provider]  ferrous sulfate 325 (65 FE) MG tablet Take 325 mg by mouth daily with breakfast. otc   Yes [provider]  fluticasone (FLONASE) 50 MCG/ACT nasal spray SHAKE LIQUID AND USE 1 SPRAY IN EACH NOSTRIL DAILY 12/28/17  Yes Juline Patch, MD  gabapentin (NEURONTIN) 300 MG capsule Take 2 capsules (600 mg total) by mouth at bedtime. 01/28/18  Yes Gillis Santa, MD  hydrALAZINE (APRESOLINE) 50 MG tablet Take 1 tablet (50 mg total) by mouth 2 (two) times daily. 10/01/17  Yes Juline Patch, MD  HYDROcodone-acetaminophen (NORCO) 7.5-325 MG tablet Take 1 tablet by mouth 3 (three) times daily as needed for up to 30 days for severe pain. For Chronic Pain 04/05/18 05/05/18 Yes Gillis Santa, MD  levothyroxine (SYNTHROID) 200 MCG tablet Take 1 tablet (200 mcg total) by mouth daily before breakfast. 10/01/17  Yes Juline Patch, MD  meclizine (ANTIVERT) 12.5 MG tablet Take 1 tablet (12.5 mg total) by mouth 2 (two) times  daily as needed for dizziness. 09/14/17  Yes Juline Patch, MD  Multiple Vitamins-Calcium (ONE-A-DAY WOMENS FORMULA PO) Take 1 tablet by mouth daily.   Yes [provider]  omega-3 acid ethyl esters (LOVAZA) 1 g capsule TAKE 2 CAPSULES BY MOUTH TWICE DAILY 02/15/18  Yes Juline Patch, MD  Polyethyl Glycol-Propyl Glycol (SYSTANE OP) Place 1 drop into both eyes daily as needed (dry eyes).    Yes [provider]  potassium chloride SA (K-DUR,KLOR-CON) 20 MEQ tablet Take 0.5 tablets (10 mEq total) by mouth daily. 11/09/17  Yes Juline Patch, MD  Propylene Glycol (SYSTANE BALANCE OP) Apply 1 drop to eye 2 (two) times daily.   Yes [provider]  ranitidine (ZANTAC) 150 MG tablet TAKE 1 TABLET(150 MG) BY MOUTH TWICE DAILY 03/29/18  Yes Juline Patch, MD  tiZANidine (ZANAFLEX) 4 MG capsule Take 1 capsule (4 mg total) by mouth 2 (two) times daily as needed. 12/29/17  Yes Gillis Santa, MD  triamterene-hydrochlorothiazide (MAXZIDE) 75-50 MG tablet TAKE 1 TABLET BY MOUTH DAILY 03/15/18  Yes Juline Patch, MD  clotrimazole (LOTRIMIN) 1 % cream Apply to affected area 2 times daily 04/05/18   Awanda Wilcock G, DO  fluconazole (DIFLUCAN) 150 MG tablet Take 1 tablet (150 mg total) by mouth once a week. 04/05/18   Coral Spikes, DO  HYDROcodone-acetaminophen (NORCO) 7.5-325 MG tablet Take 1 tablet by mouth every 8 (eight) hours as needed for up to 30 days for moderate pain. 05/05/18 06/04/18  Gillis Santa, MD  omeprazole (PRILOSEC) 40 MG capsule Take 1 capsule (40 mg total) by mouth daily. 10/01/17   Juline Patch, MD  triamcinolone cream (KENALOG) 0.1 % Apply 1 application topically every morning.    [provider]    Family History Family History  Problem Relation Age of Onset  . COPD Sister   . Cancer Maternal Aunt        breast  . Cancer Maternal Uncle        kidney    Social History Social History   Tobacco Use  . Smoking status: Former Smoker    Last attempt  to quit: 02/24/1993    Years since quitting: 25.1  . Smokeless tobacco: Never Used  Substance Use Topics  . Alcohol use: No    Alcohol/week: 0.0 standard drinks  . Drug use: No     Allergies   Ampicillin; Penicillins; and Vibramycin [doxycycline calcium]   Review of Systems Review of Systems  Constitutional: Negative.   Skin: Positive for rash.   Physical Exam Triage Vital Signs ED Triage Vitals  Enc Vitals Group     BP 04/05/18 1749 (!) 124/56     Pulse Rate 04/05/18 1749 70     Resp 04/05/18 1749 18     Temp 04/05/18 1749 98.5 F (36.9 C)     Temp Source 04/05/18 1749 Oral     SpO2 04/05/18 1749 98 %     Weight 04/05/18 1743 246 lb (111.6 kg)     Height 04/05/18 1743 5\' 11"  (1.803 m)     Head Circumference --      Peak Flow --      Pain Score 04/05/18 1743 8     Pain Loc --      Pain Edu? --      Excl. in [place]? --    Updated Vital Signs BP (!) 124/56 (BP Location: Left Arm)   Pulse 70   Temp 98.5 F (36.9 C) (Oral)   Resp 18   Ht 5\' 11"  (1.803 m)   Wt 111.6 kg   SpO2 98%   BMI 34.31 kg/m   Visual Acuity Right Eye Distance:   Left Eye Distance:   Bilateral Distance:    Right Eye Near:   Left Eye Near:    Bilateral Near:     Physical Exam Vitals signs and nursing note reviewed.  Constitutional:      General: She is not in acute distress.    Appearance: Normal appearance.  HENT:     Head: Normocephalic and atraumatic.  Eyes:     General:        Right eye: No discharge.        Left eye: No discharge.     Conjunctiva/sclera: Conjunctivae normal.  Pulmonary:     Effort: Pulmonary effort is normal. No respiratory distress.  Skin:    Comments: Patient with areas of erythema underneath the breasts, abdominal pannus.  Patient also has severe erythema in and around the umbilicus.  Neurological:     Mental Status: She is alert.  Psychiatric:        Mood and Affect: Mood normal.        Behavior: Behavior normal.    UC Treatments / Results   Labs (all labs ordered are listed, but only abnormal results are displayed) Labs Reviewed - No data to display  EKG None  Radiology No results found.  Procedures Procedures (including critical care time)  Medications Ordered in UC Medications - No data to display  Initial Impression / Assessment and Plan / UC Course  I have reviewed the triage vital signs and the nursing notes.  Pertinent labs & imaging results that were available during my care of the patient were reviewed by me and considered in my medical decision making (see chart for details).    68 year old female presents with rash.  This appears to be fungal in nature.  Treating with clotrimazole and Diflucan.  Final Clinical Impressions(s) / UC Diagnoses   Final diagnoses:  Rash     Discharge Instructions     This is fungal.  Medications as prescribed.  Take care  Dr. Lacinda Axon    ED Prescriptions    Medication Sig Dispense Auth. Provider   clotrimazole (LOTRIMIN) 1 % cream Apply to affected area 2 times daily 15 g Huong Luthi G, DO   fluconazole (DIFLUCAN) 150 MG tablet Take 1 tablet (150 mg total) by mouth once a week. 4 tablet Coral Spikes, DO     Controlled Substance Prescriptions [place] Controlled Substance Registry consulted? Not Applicable   Coral Spikes, DO 04/05/18 2018

## 2018-04-05 NOTE — Discharge Instructions (Signed)
This is fungal.  Medications as prescribed.  Take care  Dr. Lacinda Axon

## 2018-04-12 ENCOUNTER — Other Ambulatory Visit: Payer: Self-pay | Admitting: Student in an Organized Health Care Education/Training Program

## 2018-04-12 ENCOUNTER — Other Ambulatory Visit: Payer: Self-pay | Admitting: Family Medicine

## 2018-04-12 DIAGNOSIS — L400 Psoriasis vulgaris: Secondary | ICD-10-CM | POA: Diagnosis not present

## 2018-04-12 DIAGNOSIS — B351 Tinea unguium: Secondary | ICD-10-CM | POA: Diagnosis not present

## 2018-04-12 DIAGNOSIS — L309 Dermatitis, unspecified: Secondary | ICD-10-CM | POA: Diagnosis not present

## 2018-04-12 DIAGNOSIS — B353 Tinea pedis: Secondary | ICD-10-CM | POA: Diagnosis not present

## 2018-04-12 DIAGNOSIS — E039 Hypothyroidism, unspecified: Secondary | ICD-10-CM

## 2018-04-12 DIAGNOSIS — D485 Neoplasm of uncertain behavior of skin: Secondary | ICD-10-CM | POA: Diagnosis not present

## 2018-04-14 DIAGNOSIS — Z471 Aftercare following joint replacement surgery: Secondary | ICD-10-CM | POA: Diagnosis not present

## 2018-04-14 DIAGNOSIS — R768 Other specified abnormal immunological findings in serum: Secondary | ICD-10-CM | POA: Diagnosis not present

## 2018-04-14 DIAGNOSIS — M2550 Pain in unspecified joint: Secondary | ICD-10-CM | POA: Diagnosis not present

## 2018-04-14 DIAGNOSIS — M65342 Trigger finger, left ring finger: Secondary | ICD-10-CM | POA: Diagnosis not present

## 2018-04-14 DIAGNOSIS — R05 Cough: Secondary | ICD-10-CM | POA: Diagnosis not present

## 2018-04-14 DIAGNOSIS — R21 Rash and other nonspecific skin eruption: Secondary | ICD-10-CM | POA: Diagnosis not present

## 2018-04-14 DIAGNOSIS — Z96653 Presence of artificial knee joint, bilateral: Secondary | ICD-10-CM | POA: Diagnosis not present

## 2018-04-14 DIAGNOSIS — M1A00X0 Idiopathic chronic gout, unspecified site, without tophus (tophi): Secondary | ICD-10-CM | POA: Diagnosis not present

## 2018-04-16 ENCOUNTER — Telehealth: Payer: Self-pay

## 2018-04-16 NOTE — Telephone Encounter (Signed)
Ronalee Belts called to get the singning or "okay" for continuing wound care after pt is discharged. Every 30,60, and 90 days. Pt had a surgery under Dr Marry Guan. Ronalee Belts was advised to get this from Dr. Marry Guan since it is wound care due to a surgery under [facility]'s care. He agreed and will call Dr Marry Guan for sign off.

## 2018-04-19 DIAGNOSIS — M25572 Pain in left ankle and joints of left foot: Secondary | ICD-10-CM | POA: Diagnosis not present

## 2018-04-19 DIAGNOSIS — M79671 Pain in right foot: Secondary | ICD-10-CM | POA: Diagnosis not present

## 2018-04-23 DIAGNOSIS — E669 Obesity, unspecified: Secondary | ICD-10-CM | POA: Diagnosis not present

## 2018-04-23 DIAGNOSIS — L4050 Arthropathic psoriasis, unspecified: Secondary | ICD-10-CM | POA: Diagnosis not present

## 2018-04-23 DIAGNOSIS — M1A00X0 Idiopathic chronic gout, unspecified site, without tophus (tophi): Secondary | ICD-10-CM | POA: Diagnosis not present

## 2018-04-23 DIAGNOSIS — R768 Other specified abnormal immunological findings in serum: Secondary | ICD-10-CM | POA: Diagnosis not present

## 2018-04-23 DIAGNOSIS — R682 Dry mouth, unspecified: Secondary | ICD-10-CM | POA: Diagnosis not present

## 2018-04-23 DIAGNOSIS — Z79899 Other long term (current) drug therapy: Secondary | ICD-10-CM | POA: Diagnosis not present

## 2018-04-23 DIAGNOSIS — L409 Psoriasis, unspecified: Secondary | ICD-10-CM | POA: Diagnosis not present

## 2018-04-23 DIAGNOSIS — Z96653 Presence of artificial knee joint, bilateral: Secondary | ICD-10-CM | POA: Diagnosis not present

## 2018-04-23 DIAGNOSIS — M65342 Trigger finger, left ring finger: Secondary | ICD-10-CM | POA: Diagnosis not present

## 2018-05-11 ENCOUNTER — Other Ambulatory Visit: Payer: Self-pay

## 2018-05-11 ENCOUNTER — Ambulatory Visit (INDEPENDENT_AMBULATORY_CARE_PROVIDER_SITE_OTHER): Payer: Medicare Other | Admitting: Family Medicine

## 2018-05-11 ENCOUNTER — Encounter: Payer: Self-pay | Admitting: Family Medicine

## 2018-05-11 VITALS — BP 138/70 | HR 78 | Temp 98.8°F | Ht 71.0 in | Wt 261.0 lb

## 2018-05-11 DIAGNOSIS — R059 Cough, unspecified: Secondary | ICD-10-CM

## 2018-05-11 DIAGNOSIS — R05 Cough: Secondary | ICD-10-CM

## 2018-05-11 DIAGNOSIS — R6883 Chills (without fever): Secondary | ICD-10-CM | POA: Diagnosis not present

## 2018-05-11 DIAGNOSIS — J0100 Acute maxillary sinusitis, unspecified: Secondary | ICD-10-CM | POA: Diagnosis not present

## 2018-05-11 LAB — POCT INFLUENZA A/B
Influenza A, POC: NEGATIVE
Influenza B, POC: NEGATIVE

## 2018-05-11 MED ORDER — AZITHROMYCIN 250 MG PO TABS: ORAL_TABLET | ORAL | 0 refills | Status: DC

## 2018-05-11 MED ORDER — GUAIFENESIN-CODEINE 100-10 MG/5ML PO SYRP: 5.0000 mL | ORAL_SOLUTION | Freq: Four times a day (QID) | ORAL | 0 refills | Status: DC | PRN

## 2018-05-11 NOTE — Progress Notes (Signed)
Date:  05/11/2018   Name:  Michele Moore   DOB:  08/14/1950   MRN:  [id number]   Chief Complaint: Sinusitis (sore throat, cong, headache)  Sinusitis  This is a chronic problem. The current episode started more than 1 year ago. The problem has been waxing and waning since onset. There has been no fever. Associated symptoms include congestion, coughing and sinus pressure. Pertinent negatives include no chills, diaphoresis, ear pain, headaches, hoarse voice, neck pain, shortness of breath, sneezing, sore throat or swollen glands. Past treatments include nothing. The treatment provided moderate relief.    Review of Systems  Constitutional: Negative.  Negative for chills, diaphoresis, fatigue, fever and unexpected weight change.  HENT: Positive for congestion and sinus pressure. Negative for ear discharge, ear pain, hoarse voice, rhinorrhea, sneezing and sore throat.   Eyes: Negative for photophobia, pain, discharge, redness and itching.  Respiratory: Positive for cough. Negative for shortness of breath, wheezing and stridor.   Gastrointestinal: Negative for abdominal pain, blood in stool, constipation, diarrhea, nausea and vomiting.  Endocrine: Negative for cold intolerance, heat intolerance, polydipsia, polyphagia and polyuria.  Genitourinary: Negative for dysuria, flank pain, frequency, hematuria, menstrual problem, pelvic pain, urgency, vaginal bleeding and vaginal discharge.  Musculoskeletal: Negative for arthralgias, back pain, myalgias and neck pain.  Skin: Negative for rash.  Allergic/Immunologic: Negative for environmental allergies and food allergies.  Neurological: Negative for dizziness, weakness, light-headedness, numbness and headaches.  Hematological: Negative for adenopathy. Does not bruise/bleed easily.  Psychiatric/Behavioral: Negative for dysphoric mood. The patient is not nervous/anxious.     Patient Active Problem List   Diagnosis Date Noted  . Female pelvic pain  01/20/2018  . History of revision of total knee arthroplasty 01/20/2018  . Iron deficiency anemia due to chronic blood loss   . Heme + stool   . Acute gastritis without hemorrhage   . Gastric polyps   . Benign neoplasm of ascending colon   . Benign neoplasm of transverse colon   . Polyp of sigmoid colon   . Hypertension 10/01/2017  . Hypothyroidism 10/01/2017  . Gastroesophageal reflux disease 10/01/2017  . Hypokalemia 10/01/2017  . Mixed hyperlipidemia 10/01/2017  . Reactive airway disease 10/01/2017  . Bradycardia 08/24/2017  . Severe obesity (BMI 35.0-35.9 with comorbidity) ([place]) 08/18/2017  . Chronic gouty arthropathy without tophi 06/25/2017  . Encounter for long-term (current) use of high-risk medication 06/25/2017  . Positive ANA (antinuclear antibody) 06/17/2017  . Cervical facet syndrome 06/16/2017  . Osteoarthritis of right shoulder due to rotator cuff injury 06/16/2017  . Sprain of anterior talofibular ligament of right ankle 06/16/2017  . Lumbar radiculopathy 04/21/2017  . Lumbar degenerative disc disease 04/21/2017  . Chronic pain syndrome 04/21/2017  . Spinal stenosis, lumbar region, with neurogenic claudication 04/21/2017  . SS-A antibody positive 03/05/2017  . Pain, lower extremity 08/03/2015  . DOE (dyspnea on exertion) 12/28/2013    Allergies  Allergen Reactions  . Ampicillin Swelling  . Penicillins Anaphylaxis and Swelling    Has patient had a PCN reaction causing immediate rash, facial/tongue/throat swelling, SOB or lightheadedness with hypotension: Yes Has patient had a PCN reaction causing severe rash involving mucus membranes or skin necrosis: No Has patient had a PCN reaction that required hospitalization: No Has patient had a PCN reaction occurring within the last 10 years: Yes If all of the above answers are "NO", then may proceed with Cephalosporin use.  . Vibramycin [Doxycycline Calcium] Rash    Past Surgical History:  Procedure Laterality Date   .  ANKLE SURGERY    . CARPAL TUNNEL RELEASE     x3  . COLONOSCOPY  2000?  . COLONOSCOPY WITH PROPOFOL N/A 10/22/2017   Procedure: COLONOSCOPY WITH PROPOFOL;  Surgeon: Lucilla Lame, MD;  Location: [place];  Service: Endoscopy;  Laterality: N/A;  . ESOPHAGOGASTRODUODENOSCOPY (EGD) WITH PROPOFOL N/A 10/22/2017   Procedure: ESOPHAGOGASTRODUODENOSCOPY (EGD) WITH PROPOFOL;  Surgeon: Lucilla Lame, MD;  Location: [place];  Service: Endoscopy;  Laterality: N/A;  . FUSION OF TALONAVICULAR JOINT Right 08/03/2015   Procedure: TAILOR NAVICULAR JOINT FUSION - RIGHT ;  Surgeon: [name], DPM;  Location: [facility];  Service: Podiatry;  Laterality: Right;  . JOINT REPLACEMENT     knee x 3 ,right x1 and left x2  . POLYPECTOMY N/A 10/22/2017   Procedure: POLYPECTOMY;  Surgeon: Lucilla Lame, MD;  Location: [place];  Service: Endoscopy;  Laterality: N/A;  . ROTATOR CUFF REPAIR    . TONSILLECTOMY    . TOTAL KNEE REVISION Right 01/20/2018   Procedure: POLYETHYLENE EXCHANGE;  Surgeon: Dereck Leep, MD;  Location: [facility];  Service: Orthopedics;  Laterality: Right;  . TUBAL LIGATION    . UPPER GI ENDOSCOPY  2000?    Social History   Tobacco Use  . Smoking status: Former Smoker    Last attempt to quit: 02/24/1993    Years since quitting: 25.2  . Smokeless tobacco: Never Used  Substance Use Topics  . Alcohol use: No    Alcohol/week: 0.0 standard drinks  . Drug use: No     Medication list has been reviewed and updated.  Current Meds  Medication Sig  . albuterol (PROVENTIL HFA;VENTOLIN HFA) 108 (90 Base) MCG/ACT inhaler Inhale 2 puffs into the lungs every 4 (four) hours as needed for wheezing.  Marland Kitchen allopurinol (ZYLOPRIM) 100 MG tablet Take 2 tablets by mouth daily. Dr Barb Merino  . Cholecalciferol (VITAMIN D3) 2000 units TABS Take 2,000 Units by mouth daily.  . clotrimazole (LOTRIMIN) 1 % cream Apply to affected area 2 times daily  . diclofenac sodium (VOLTAREN) 1 % GEL  Apply 2 g topically 3 (three) times daily. [facility]  . ferrous sulfate 325 (65 FE) MG tablet Take 325 mg by mouth daily with breakfast. otc  . fluconazole (DIFLUCAN) 150 MG tablet Take 1 tablet (150 mg total) by mouth once a week.  . fluticasone (FLONASE) 50 MCG/ACT nasal spray SHAKE LIQUID AND USE 1 SPRAY IN EACH NOSTRIL DAILY  . gabapentin (NEURONTIN) 300 MG capsule Take 2 capsules (600 mg total) by mouth at bedtime.  . hydrALAZINE (APRESOLINE) 50 MG tablet Take 1 tablet (50 mg total) by mouth 2 (two) times daily.  Marland Kitchen HYDROcodone-acetaminophen (NORCO) 7.5-325 MG tablet Take 1 tablet by mouth every 8 (eight) hours as needed for up to 30 days for moderate pain.  . meclizine (ANTIVERT) 12.5 MG tablet Take 1 tablet (12.5 mg total) by mouth 2 (two) times daily as needed for dizziness.  . Multiple Vitamins-Calcium (ONE-A-DAY WOMENS FORMULA PO) Take 1 tablet by mouth daily.  Marland Kitchen omega-3 acid ethyl esters (LOVAZA) 1 g capsule TAKE 2 CAPSULES BY MOUTH TWICE DAILY  . omeprazole (PRILOSEC) 40 MG capsule Take 1 capsule (40 mg total) by mouth daily.  Vladimir Faster Glycol-Propyl Glycol (SYSTANE OP) Place 1 drop into both eyes daily as needed (dry eyes).   . potassium chloride SA (K-DUR,KLOR-CON) 20 MEQ tablet Take 0.5 tablets (10 mEq total) by mouth daily.  Marland Kitchen Propylene Glycol (SYSTANE BALANCE OP) Apply  1 drop to eye 2 (two) times daily.  . ranitidine (ZANTAC) 150 MG tablet TAKE 1 TABLET(150 MG) BY MOUTH TWICE DAILY  . SYNTHROID 200 MCG tablet TAKE 1 TABLET(200 MCG) BY MOUTH DAILY BEFORE BREAKFAST  . tiZANidine (ZANAFLEX) 4 MG capsule Take 1 capsule (4 mg total) by mouth 2 (two) times daily as needed.  . triamcinolone cream (KENALOG) 0.1 % Apply 1 application topically every morning.  . triamterene-hydrochlorothiazide (MAXZIDE) 75-50 MG tablet TAKE 1 TABLET BY MOUTH DAILY    PHQ 2/9 Scores 03/30/2018 03/01/2018 12/29/2017 12/16/2017  PHQ - 2 Score 0 0 0 0    Physical Exam Vitals signs and nursing  note reviewed.  Constitutional:      General: She is not in acute distress.    Appearance: She is not diaphoretic.  HENT:     Head: Normocephalic and atraumatic.     Right Ear: Tympanic membrane, ear canal and external ear normal.     Left Ear: Tympanic membrane, ear canal and external ear normal.     Nose: Nose normal. No congestion or rhinorrhea.     Mouth/Throat:     Mouth: Mucous membranes are moist.  Eyes:     General:        Right eye: No discharge.        Left eye: No discharge.     Conjunctiva/sclera: Conjunctivae normal.     Pupils: Pupils are equal, round, and reactive to light.  Neck:     Musculoskeletal: Normal range of motion and neck supple. No neck rigidity or muscular tenderness.     Thyroid: No thyromegaly.     Vascular: No carotid bruit or JVD.  Cardiovascular:     Rate and Rhythm: Normal rate and regular rhythm.     Pulses: Normal pulses.     Heart sounds: Normal heart sounds. No murmur. No friction rub. No gallop.   Pulmonary:     Effort: Pulmonary effort is normal.     Breath sounds: Normal breath sounds. No wheezing, rhonchi or rales.  Abdominal:     General: Bowel sounds are normal.     Palpations: Abdomen is soft. There is no mass.     Tenderness: There is no abdominal tenderness. There is no guarding.  Musculoskeletal: Normal range of motion.  Lymphadenopathy:     Cervical: No cervical adenopathy.  Skin:    General: Skin is warm and dry.  Neurological:     Mental Status: She is alert.     Deep Tendon Reflexes: Reflexes are normal and symmetric.     Wt Readings from Last 3 Encounters:  05/11/18 261 lb (118.4 kg)  04/05/18 246 lb (111.6 kg)  03/30/18 246 lb (111.6 kg)    BP 138/70   Pulse 78   Temp 98.8 F (37.1 C) (Oral)   Ht 5\' 11"  (1.803 m)   Wt 261 lb (118.4 kg)   SpO2 98%   BMI 36.40 kg/m   Assessment and Plan: 1. Acute maxillary sinusitis, recurrence not specified Acute.  Persistent.  Initiate azithromycin 250 mg 2 today  followed by 1 a day for 4 days. - azithromycin (ZITHROMAX) 250 MG tablet; 2 today then 1 a day for 4 days  Dispense: 6 tablet; Refill: 0  2. Cough Persistent cough.  Will initiate Robitussin-AC 1 teaspoon every 6 hours - guaiFENesin-codeine (ROBITUSSIN AC) 100-10 MG/5ML syrup; Take 5 mLs by mouth 4 (four) times daily as needed for cough.  Dispense: 118 mL; Refill: 0  3. Chills (  without fever) Chills without fever patient was checked for influenza which was negative for a and B. - POCT Influenza A/B

## 2018-05-12 ENCOUNTER — Ambulatory Visit: Payer: Medicare Other | Admitting: Student in an Organized Health Care Education/Training Program

## 2018-05-13 ENCOUNTER — Ambulatory Visit
Admission: EM | Admit: 2018-05-13 | Discharge: 2018-05-13 | Disposition: A | Discharge status: Home / Self Care | Payer: Medicare Other | Attending: Family Medicine | Admitting: Family Medicine

## 2018-05-13 ENCOUNTER — Other Ambulatory Visit: Payer: Self-pay

## 2018-05-13 DIAGNOSIS — H00012 Hordeolum externum right lower eyelid: Secondary | ICD-10-CM

## 2018-05-13 MED ORDER — POLYMYXIN B-TRIMETHOPRIM 10000-0.1 UNIT/ML-% OP SOLN: 1.0000 [drp] | Freq: Four times a day (QID) | OPHTHALMIC | 0 refills | Status: AC

## 2018-05-13 NOTE — ED Triage Notes (Signed)
Patient states that she has right eye pain. Patient states that pain is primarly in the corner of her eye. Patient states that she has swelling under eye. Patient states that she did not bring her glasses to test her vision. Patient states that she has also noticed a headache.

## 2018-05-13 NOTE — Discharge Instructions (Signed)
Warm compresses.  Antibiotic as prescribed.  Take care  Dr. Jamarie Joplin  

## 2018-05-13 NOTE — ED Provider Notes (Signed)
[facility]    CSN: [id number] Arrival date & time: 05/13/18  1932  History   Chief Complaint Chief Complaint  Patient presents with  . Eye Pain   HPI   68 year old female presents with right eye pain.  Right eye pain  Started last night.   Worsened this am.  Pain - R lower lid.  Mild swelling below the eye.  No discharge.  Also reports headache.  Pain reported at 8/10 in severity.  No known exacerbating or relieving factors.  No medications or interventions tried.  No other complaints.  PMH, Surgical Hx, Family Hx, Social History reviewed and updated as below.  Past Medical History:  Diagnosis Date  . Allergy   . Anemia   . Arthritis   . Asthma   . Chronic kidney disease    STAGE 3 PER DR EASON 08/02/15  . Collagen vascular disease ([place])   . Family history of adverse reaction to anesthesia    sister difficult to put to sleep  . GERD (gastroesophageal reflux disease)   . H/O tooth extraction    all lower teeth 1/19  . Hemorrhoid   . History of hiatal hernia   . Hypertension   . Hypothyroidism   . Migraines   . Mixed hyperlipidemia   . Multiple gastric ulcers   . Thyroid disease   . Wears dentures    partial upper and lower    Patient Active Problem List   Diagnosis Date Noted  . Female pelvic pain 01/20/2018  . History of revision of total knee arthroplasty 01/20/2018  . Iron deficiency anemia due to chronic blood loss   . Heme + stool   . Acute gastritis without hemorrhage   . Gastric polyps   . Benign neoplasm of ascending colon   . Benign neoplasm of transverse colon   . Polyp of sigmoid colon   . Hypertension 10/01/2017  . Hypothyroidism 10/01/2017  . Gastroesophageal reflux disease 10/01/2017  . Hypokalemia 10/01/2017  . Mixed hyperlipidemia 10/01/2017  . Reactive airway disease 10/01/2017  . Bradycardia 08/24/2017  . Severe obesity (BMI 35.0-35.9 with comorbidity) ([place]) 08/18/2017  . Chronic gouty arthropathy without  tophi 06/25/2017  . Encounter for long-term (current) use of high-risk medication 06/25/2017  . Positive ANA (antinuclear antibody) 06/17/2017  . Cervical facet syndrome 06/16/2017  . Osteoarthritis of right shoulder due to rotator cuff injury 06/16/2017  . Sprain of anterior talofibular ligament of right ankle 06/16/2017  . Lumbar radiculopathy 04/21/2017  . Lumbar degenerative disc disease 04/21/2017  . Chronic pain syndrome 04/21/2017  . Spinal stenosis, lumbar region, with neurogenic claudication 04/21/2017  . SS-A antibody positive 03/05/2017  . Pain, lower extremity 08/03/2015  . DOE (dyspnea on exertion) 12/28/2013    Past Surgical History:  Procedure Laterality Date  . ANKLE SURGERY    . CARPAL TUNNEL RELEASE     x3  . COLONOSCOPY  2000?  . COLONOSCOPY WITH PROPOFOL N/A 10/22/2017   Procedure: COLONOSCOPY WITH PROPOFOL;  Surgeon: Lucilla Lame, MD;  Location: [facility];  Service: Endoscopy;  Laterality: N/A;  . ESOPHAGOGASTRODUODENOSCOPY (EGD) WITH PROPOFOL N/A 10/22/2017   Procedure: ESOPHAGOGASTRODUODENOSCOPY (EGD) WITH PROPOFOL;  Surgeon: Lucilla Lame, MD;  Location: [facility];  Service: Endoscopy;  Laterality: N/A;  . FUSION OF TALONAVICULAR JOINT Right 08/03/2015   Procedure: TAILOR NAVICULAR JOINT FUSION - RIGHT ;  Surgeon: [name], DPM;  Location: [facility];  Service: Podiatry;  Laterality: Right;  . JOINT REPLACEMENT  knee x 3 ,right x1 and left x2  . POLYPECTOMY N/A 10/22/2017   Procedure: POLYPECTOMY;  Surgeon: Lucilla Lame, MD;  Location: [facility];  Service: Endoscopy;  Laterality: N/A;  . ROTATOR CUFF REPAIR    . TONSILLECTOMY    . TOTAL KNEE REVISION Right 01/20/2018   Procedure: POLYETHYLENE EXCHANGE;  Surgeon: Dereck Leep, MD;  Location: [facility];  Service: Orthopedics;  Laterality: Right;  . TUBAL LIGATION    . UPPER GI ENDOSCOPY  2000?    OB History    Gravida  4   Para      Term      Preterm      AB       Living        SAB      TAB      Ectopic      Multiple      Live Births           Obstetric Comments  1st Menstrual Cycle:  16 1st Pregnancy:  19         Home Medications    Prior to Admission medications   Medication Sig Start Date End Date Taking? Authorizing Provider  albuterol (PROVENTIL HFA;VENTOLIN HFA) 108 (90 Base) MCG/ACT inhaler Inhale 2 puffs into the lungs every 4 (four) hours as needed for wheezing. 10/01/17  Yes Juline Patch, MD  allopurinol (ZYLOPRIM) 100 MG tablet Take 2 tablets by mouth daily. Dr Barb Merino 08/06/17  Yes [provider]  Cholecalciferol (VITAMIN D3) 2000 units TABS Take 2,000 Units by mouth daily.   Yes [provider]  clotrimazole (LOTRIMIN) 1 % cream Apply to affected area 2 times daily 04/05/18  Yes Kati Riggenbach G, DO  diclofenac sodium (VOLTAREN) 1 % GEL Apply 2 g topically 3 (three) times daily. [facility] 08/11/17  Yes [provider]  ferrous sulfate 325 (65 FE) MG tablet Take 325 mg by mouth daily with breakfast. otc   Yes [provider]  fluconazole (DIFLUCAN) 150 MG tablet Take 1 tablet (150 mg total) by mouth once a week. 04/05/18  Yes Anda Sobotta G, DO  fluticasone (FLONASE) 50 MCG/ACT nasal spray SHAKE LIQUID AND USE 1 SPRAY IN EACH NOSTRIL DAILY 12/28/17  Yes Juline Patch, MD  gabapentin (NEURONTIN) 300 MG capsule Take 2 capsules (600 mg total) by mouth at bedtime. 01/28/18  Yes Gillis Santa, MD  guaiFENesin-codeine (ROBITUSSIN AC) 100-10 MG/5ML syrup Take 5 mLs by mouth 4 (four) times daily as needed for cough. 05/11/18  Yes Juline Patch, MD  hydrALAZINE (APRESOLINE) 50 MG tablet Take 1 tablet (50 mg total) by mouth 2 (two) times daily. 10/01/17  Yes Juline Patch, MD  HYDROcodone-acetaminophen (NORCO) 7.5-325 MG tablet Take 1 tablet by mouth every 8 (eight) hours as needed for up to 30 days for moderate pain. 05/05/18 06/04/18 Yes Gillis Santa, MD  meclizine (ANTIVERT) 12.5 MG tablet  Take 1 tablet (12.5 mg total) by mouth 2 (two) times daily as needed for dizziness. 09/14/17  Yes Juline Patch, MD  Multiple Vitamins-Calcium (ONE-A-DAY WOMENS FORMULA PO) Take 1 tablet by mouth daily.   Yes [provider]  omega-3 acid ethyl esters (LOVAZA) 1 g capsule TAKE 2 CAPSULES BY MOUTH TWICE DAILY 02/15/18  Yes Juline Patch, MD  omeprazole (PRILOSEC) 40 MG capsule Take 1 capsule (40 mg total) by mouth daily. 10/01/17  Yes Juline Patch, MD  Polyethyl Glycol-Propyl Glycol (SYSTANE OP) Place  1 drop into both eyes daily as needed (dry eyes).    Yes [provider]  potassium chloride SA (K-DUR,KLOR-CON) 20 MEQ tablet Take 0.5 tablets (10 mEq total) by mouth daily. 11/09/17  Yes Juline Patch, MD  Propylene Glycol (SYSTANE BALANCE OP) Apply 1 drop to eye 2 (two) times daily.   Yes [provider]  ranitidine (ZANTAC) 150 MG tablet TAKE 1 TABLET(150 MG) BY MOUTH TWICE DAILY 03/29/18  Yes Juline Patch, MD  SYNTHROID 200 MCG tablet TAKE 1 TABLET(200 MCG) BY MOUTH DAILY BEFORE BREAKFAST 04/12/18  Yes Juline Patch, MD  tiZANidine (ZANAFLEX) 4 MG capsule Take 1 capsule (4 mg total) by mouth 2 (two) times daily as needed. 12/29/17  Yes Gillis Santa, MD  triamcinolone cream (KENALOG) 0.1 % Apply 1 application topically every morning.   Yes [provider]  triamterene-hydrochlorothiazide (MAXZIDE) 75-50 MG tablet TAKE 1 TABLET BY MOUTH DAILY 03/15/18  Yes Juline Patch, MD  trimethoprim-polymyxin b (POLYTRIM) ophthalmic solution Place 1 drop into the right eye every 6 (six) hours for 5 days. 05/13/18 05/18/18  Coral Spikes, DO    Family History Family History  Problem Relation Age of Onset  . COPD Sister   . Cancer Maternal Aunt        breast  . Cancer Maternal Uncle        kidney    Social History Social History   Tobacco Use  . Smoking status: Former Smoker    Last attempt to quit: 02/24/1993    Years since quitting: 25.2  . Smokeless  tobacco: Never Used  Substance Use Topics  . Alcohol use: No    Alcohol/week: 0.0 standard drinks  . Drug use: No     Allergies   Ampicillin; Penicillins; and Vibramycin [doxycycline calcium]   Review of Systems Review of Systems  Constitutional: Negative.   Eyes: Positive for pain. Negative for redness.  Neurological: Positive for headaches.   Physical Exam Triage Vital Signs ED Triage Vitals  Enc Vitals Group     BP 05/13/18 1945 136/85     Pulse Rate 05/13/18 1945 60     Resp 05/13/18 1945 18     Temp 05/13/18 1945 98.2 F (36.8 C)     Temp Source 05/13/18 1945 Oral     SpO2 05/13/18 1945 100 %     Weight 05/13/18 1943 261 lb (118.4 kg)     Height 05/13/18 1943 5\' 11"  (1.803 m)     Head Circumference --      Peak Flow --      Pain Score 05/13/18 1943 8     Pain Loc --      Pain Edu? --      Excl. in [place]? --    Updated Vital Signs BP 136/85 (BP Location: Left Arm)   Pulse 60   Temp 98.2 F (36.8 C) (Oral)   Resp 18   Ht 5\' 11"  (1.803 m)   Wt 118.4 kg   SpO2 100%   BMI 36.40 kg/m   Visual Acuity Right Eye Distance:   Left Eye Distance:   Bilateral Distance:    Right Eye Near:   Left Eye Near:    Bilateral Near:     Physical Exam Vitals signs and nursing note reviewed.  Constitutional:      General: She is not in acute distress.    Appearance: Normal appearance.  HENT:     Head: Normocephalic and atraumatic.  Eyes:  General:        Right eye: No discharge.        Left eye: No discharge.     Conjunctiva/sclera: Conjunctivae normal.      Comments: Mild swelling in the leg location.  She has some swelling beneath the eye as well.  No conjunctival injection.  No discharge.  Cardiovascular:     Rate and Rhythm: Normal rate and regular rhythm.  Pulmonary:     Effort: Pulmonary effort is normal.     Breath sounds: Normal breath sounds.  Neurological:     Mental Status: She is alert.  Psychiatric:        Mood and Affect: Mood normal.         Behavior: Behavior normal.    UC Treatments / Results  Labs (all labs ordered are listed, but only abnormal results are displayed) Labs Reviewed - No data to display  EKG None  Radiology No results found.  Procedures Procedures (including critical care time)  Medications Ordered in UC Medications - No data to display  Initial Impression / Assessment and Plan / UC Course  I have reviewed the triage vital signs and the nursing notes.  Pertinent labs & imaging results that were available during my care of the patient were reviewed by me and considered in my medical decision making (see chart for details).    68 year old female presents with a suspected stye.  Warm compresses.  Polytrim as directed.  Final Clinical Impressions(s) / UC Diagnoses   Final diagnoses:  Hordeolum externum of right lower eyelid     Discharge Instructions     Warm compresses.  Antibiotic as prescribed.  Take care  Dr. Lacinda Axon    ED Prescriptions    Medication Sig Dispense Auth. Provider   trimethoprim-polymyxin b (POLYTRIM) ophthalmic solution Place 1 drop into the right eye every 6 (six) hours for 5 days. 10 mL Coral Spikes, DO     Controlled Substance Prescriptions [place] Controlled Substance Registry consulted? Not Applicable   Coral Spikes, DO 05/13/18 2011

## 2018-05-21 ENCOUNTER — Ambulatory Visit
Admission: EM | Admit: 2018-05-21 | Discharge: 2018-05-21 | Disposition: A | Discharge status: Home / Self Care | Payer: Medicare Other | Attending: Emergency Medicine | Admitting: Emergency Medicine

## 2018-05-21 ENCOUNTER — Other Ambulatory Visit: Payer: Self-pay

## 2018-05-21 DIAGNOSIS — H00012 Hordeolum externum right lower eyelid: Secondary | ICD-10-CM

## 2018-05-21 DIAGNOSIS — Z79899 Other long term (current) drug therapy: Secondary | ICD-10-CM | POA: Diagnosis not present

## 2018-05-21 DIAGNOSIS — L4050 Arthropathic psoriasis, unspecified: Secondary | ICD-10-CM | POA: Diagnosis not present

## 2018-05-21 DIAGNOSIS — L409 Psoriasis, unspecified: Secondary | ICD-10-CM | POA: Diagnosis not present

## 2018-05-21 MED ORDER — DOXYCYCLINE HYCLATE 100 MG PO CAPS: 100.0000 mg | ORAL_CAPSULE | Freq: Two times a day (BID) | ORAL | 0 refills | Status: AC

## 2018-05-21 NOTE — Discharge Instructions (Addendum)
Take the doxycycline for at least 7 days.  If your stye is completely gone after 7 days, then stop the doxycycline, if you still have symptoms, then finish the 10-day course of doxycycline.  Start doing warm compresses 3 times a day. You may get some baby shampoo, dilute this with warm water, and gently wipe your upper and lower eyelid while you are taking a shower. This will help prevent the recurrence of any styes or chalazions. Try not to rub your eyes. You may use Systane as often as you want for comfort. Return immediately to the ER for fever above 100.4, if you have pain moving your eyes, any visual changes, nausea, vomiting, headaches, a rash around your eye, or any other concerns.  Go to www.goodrx.com to look up your medications. This will give you a list of where you can find your prescriptions at the most affordable prices. Or ask the pharmacist what the cash price is, or if they have any other discount programs available to help make your medication more affordable. This can be less expensive than what you would pay with insurance.

## 2018-05-21 NOTE — ED Provider Notes (Signed)
HPI  SUBJECTIVE:  Patient presents with a painful erythematous mass along the right lateral eyelid starting 9 days ago.  Describes burning pain, intermittently blurry vision, states that it is like a film over her eye, she reports drainage, crusting, and a foreign body sensation.  She reports right-sided headache.  She has had this for 9 days total. Patient was seen here 8 days ago with right eye pain, headache.  She was found to have an stye of the right lower eyelid.  Was sent home with Polytrim.  Was also doing warm compresses, and states that she was getting better, but symptoms got worse 2 days ago.  She states that her symptoms are worse with going outside and to the pollen.  She has a past medical history of glaucoma for which she takes medications, hypertension, chronic kidney disease.  No history of diabetes.  PMD: Dr. Ronnald Ramp.  Ophthalmology: Dr. Zena Amos.  Patient states that she contacted her ophthalmologist earlier today, but it was decided that she was to be evaluated at the urgent care.  The ophthalmologist's RN requests that I contact her.  Past Medical History:  Diagnosis Date   Allergy    Anemia    Arthritis    Asthma    Chronic kidney disease    STAGE 3 PER DR EASON 08/02/15   Collagen vascular disease ([place])    Family history of adverse reaction to anesthesia    sister difficult to put to sleep   GERD (gastroesophageal reflux disease)    H/O tooth extraction    all lower teeth 1/19   Hemorrhoid    History of hiatal hernia    Hypertension    Hypothyroidism    Migraines    Mixed hyperlipidemia    Multiple gastric ulcers    Thyroid disease    Wears dentures    partial upper and lower    Past Surgical History:  Procedure Laterality Date   ANKLE SURGERY     CARPAL TUNNEL RELEASE     x3   COLONOSCOPY  2000?   COLONOSCOPY WITH PROPOFOL N/A 10/22/2017   Procedure: COLONOSCOPY WITH PROPOFOL;  Surgeon: Lucilla Lame, MD;  Location: [facility];   Service: Endoscopy;  Laterality: N/A;   ESOPHAGOGASTRODUODENOSCOPY (EGD) WITH PROPOFOL N/A 10/22/2017   Procedure: ESOPHAGOGASTRODUODENOSCOPY (EGD) WITH PROPOFOL;  Surgeon: Lucilla Lame, MD;  Location: [facility];  Service: Endoscopy;  Laterality: N/A;   FUSION OF TALONAVICULAR JOINT Right 08/03/2015   Procedure: TAILOR NAVICULAR JOINT FUSION - RIGHT ;  Surgeon: [name], DPM;  Location: [facility];  Service: Podiatry;  Laterality: Right;   JOINT REPLACEMENT     knee x 3 ,right x1 and left x2   POLYPECTOMY N/A 10/22/2017   Procedure: POLYPECTOMY;  Surgeon: Lucilla Lame, MD;  Location: [facility];  Service: Endoscopy;  Laterality: N/A;   ROTATOR CUFF REPAIR     TONSILLECTOMY     TOTAL KNEE REVISION Right 01/20/2018   Procedure: POLYETHYLENE EXCHANGE;  Surgeon: Dereck Leep, MD;  Location: [facility];  Service: Orthopedics;  Laterality: Right;   TUBAL LIGATION     UPPER GI ENDOSCOPY  2000?    Family History  Problem Relation Age of Onset   COPD Sister    Cancer Maternal Aunt        breast   Cancer Maternal Uncle        kidney    Social History   Tobacco Use   Smoking status: Former Smoker  Last attempt to quit: 02/24/1993    Years since quitting: 25.2   Smokeless tobacco: Never Used  Substance Use Topics   Alcohol use: No    Alcohol/week: 0.0 standard drinks   Drug use: No    No current facility-administered medications for this encounter.   Current Outpatient Medications:    albuterol (PROVENTIL HFA;VENTOLIN HFA) 108 (90 Base) MCG/ACT inhaler, Inhale 2 puffs into the lungs every 4 (four) hours as needed for wheezing., Disp: 1 Inhaler, Rfl: 5   allopurinol (ZYLOPRIM) 100 MG tablet, Take 2 tablets by mouth daily. Dr Barb Merino, Disp: , Rfl:    Cholecalciferol (VITAMIN D3) 2000 units TABS, Take 2,000 Units by mouth daily., Disp: , Rfl:    clotrimazole (LOTRIMIN) 1 % cream, Apply to affected area 2 times daily, Disp: 15 g, Rfl: 0   diclofenac  sodium (VOLTAREN) 1 % GEL, Apply 2 g topically 3 (three) times daily. [facility], Disp: , Rfl:    ferrous sulfate 325 (65 FE) MG tablet, Take 325 mg by mouth daily with breakfast. otc, Disp: , Rfl:    fluconazole (DIFLUCAN) 150 MG tablet, Take 1 tablet (150 mg total) by mouth once a week., Disp: 4 tablet, Rfl: 0   fluticasone (FLONASE) 50 MCG/ACT nasal spray, SHAKE LIQUID AND USE 1 SPRAY IN EACH NOSTRIL DAILY, Disp: 48 g, Rfl: 0   gabapentin (NEURONTIN) 300 MG capsule, Take 2 capsules (600 mg total) by mouth at bedtime., Disp: 60 capsule, Rfl: 4   guaiFENesin-codeine (ROBITUSSIN AC) 100-10 MG/5ML syrup, Take 5 mLs by mouth 4 (four) times daily as needed for cough., Disp: 118 mL, Rfl: 0   hydrALAZINE (APRESOLINE) 50 MG tablet, Take 1 tablet (50 mg total) by mouth 2 (two) times daily., Disp: 60 tablet, Rfl: 1   HYDROcodone-acetaminophen (NORCO) 7.5-325 MG tablet, Take 1 tablet by mouth every 8 (eight) hours as needed for up to 30 days for moderate pain., Disp: 90 tablet, Rfl: 0   meclizine (ANTIVERT) 12.5 MG tablet, Take 1 tablet (12.5 mg total) by mouth 2 (two) times daily as needed for dizziness., Disp: 30 tablet, Rfl: 0   Multiple Vitamins-Calcium (ONE-A-DAY WOMENS FORMULA PO), Take 1 tablet by mouth daily., Disp: , Rfl:    omega-3 acid ethyl esters (LOVAZA) 1 g capsule, TAKE 2 CAPSULES BY MOUTH TWICE DAILY, Disp: 120 capsule, Rfl: 0   omeprazole (PRILOSEC) 40 MG capsule, Take 1 capsule (40 mg total) by mouth daily., Disp: 30 capsule, Rfl: 5   Polyethyl Glycol-Propyl Glycol (SYSTANE OP), Place 1 drop into both eyes daily as needed (dry eyes). , Disp: , Rfl:    potassium chloride SA (K-DUR,KLOR-CON) 20 MEQ tablet, Take 0.5 tablets (10 mEq total) by mouth daily., Disp: 30 tablet, Rfl: 5   Propylene Glycol (SYSTANE BALANCE OP), Apply 1 drop to eye 2 (two) times daily., Disp: , Rfl:    ranitidine (ZANTAC) 150 MG tablet, TAKE 1 TABLET(150 MG) BY MOUTH TWICE DAILY, Disp: 60  tablet, Rfl: 1   SYNTHROID 200 MCG tablet, TAKE 1 TABLET(200 MCG) BY MOUTH DAILY BEFORE BREAKFAST, Disp: 30 tablet, Rfl: 2   tiZANidine (ZANAFLEX) 4 MG capsule, Take 1 capsule (4 mg total) by mouth 2 (two) times daily as needed., Disp: 60 capsule, Rfl: 2   triamcinolone cream (KENALOG) 0.1 %, Apply 1 application topically every morning., Disp: , Rfl:    triamterene-hydrochlorothiazide (MAXZIDE) 75-50 MG tablet, TAKE 1 TABLET BY MOUTH DAILY, Disp: 30 tablet, Rfl: 0   doxycycline (VIBRAMYCIN) 100 MG  capsule, Take 1 capsule (100 mg total) by mouth 2 (two) times daily for 10 days., Disp: 20 capsule, Rfl: 0  Allergies  Allergen Reactions   Ampicillin Swelling   Penicillins Anaphylaxis and Swelling    Has patient had a PCN reaction causing immediate rash, facial/tongue/throat swelling, SOB or lightheadedness with hypotension: Yes Has patient had a PCN reaction causing severe rash involving mucus membranes or skin necrosis: No Has patient had a PCN reaction that required hospitalization: No Has patient had a PCN reaction occurring within the last 10 years: Yes If all of the above answers are "NO", then may proceed with Cephalosporin use.   Vibramycin [Doxycycline Calcium] Rash     ROS  As noted in HPI.   Physical Exam  BP 111/60 (BP Location: Right Arm)    Pulse 79    Temp 98.3 F (36.8 C) (Oral)    Resp 18    Ht 5\' 11"  (1.803 m)    Wt 118.4 kg    SpO2 98%    BMI 36.40 kg/m   Constitutional: Well developed, well nourished, no acute distress Eyes:  EOMI, conjunctiva normal bilaterally.  PERRLA.  Mild direct photophobia in the right eye.  No consensual photophobia.  Patient states this photophobia is not something new or different for her.  Positive tender  swelling, erythema with some crusting lateral right lower eyelid consistent with a stye.  See picture.  No foreign body seen on lid eversion.  No abrasion seen on fluorescein exam. Visual acuity not done, patient forgot her  glasses     HENT: Normocephalic, atraumatic,mucus membranes moist Respiratory: Normal inspiratory effort Cardiovascular: Normal rate GI: nondistended skin: No rash, skin intact Musculoskeletal: no deformities Neurologic: Alert & oriented x 3, no focal neuro deficits Psychiatric: Speech and behavior appropriate   ED Course   Medications - No data to display  No orders of the defined types were placed in this encounter.   No results found for this or any previous visit (from the past 24 hour(s)). No results found.  ED Clinical Impression  Hordeolum externum of right lower eyelid   ED Assessment/Plan  Previous records reviewed.  As noted in HPI.  Pt With a hordeolum not responding to topical therapy.  Discussed case with Suanne Marker, Dr. Luther Bradley nurse.  She recommended doxycycline 100 mg p.o. twice daily for 10 days.  Patient may stop it in 7 days if the stye is completely gone, otherwise she will need to complete all 10 days of it.  Warm compresses 3 times a day, lid hygiene with very diluted baby shampoo after that.  They will follow-up with her on Monday.   Discussed MDM, treatment plan, and plan for follow-up with patient.  patient agrees with plan.   Meds ordered this encounter  Medications   doxycycline (VIBRAMYCIN) 100 MG capsule    Sig: Take 1 capsule (100 mg total) by mouth 2 (two) times daily for 10 days.    Dispense:  20 capsule    Refill:  0    *This clinic note was created using [employer]. Therefore, there may be occasional mistakes despite careful proofreading.   ?   Melynda Ripple, MD 05/22/18 [id number]

## 2018-05-21 NOTE — ED Triage Notes (Signed)
Patient complains of right eye pain that started 10 days ago. Patient states that she has been using eye drops that she received from here but didn't improve. Called eye doctor and they asked her to come here for possible antibiotic. Patient states that she has been having a terrible time with allergies recently. Patient states that she forgot her glasses again and is unable to see eye chart.

## 2018-05-24 ENCOUNTER — Other Ambulatory Visit: Payer: Self-pay | Admitting: Family Medicine

## 2018-05-24 DIAGNOSIS — K219 Gastro-esophageal reflux disease without esophagitis: Secondary | ICD-10-CM

## 2018-05-26 ENCOUNTER — Ambulatory Visit: Payer: Medicare Other | Admitting: Student in an Organized Health Care Education/Training Program

## 2018-05-27 ENCOUNTER — Telehealth: Payer: Self-pay | Admitting: *Deleted

## 2018-05-27 ENCOUNTER — Ambulatory Visit
Payer: Medicare Other | Attending: Student in an Organized Health Care Education/Training Program | Admitting: Student in an Organized Health Care Education/Training Program

## 2018-05-27 ENCOUNTER — Other Ambulatory Visit: Payer: Self-pay

## 2018-05-27 DIAGNOSIS — M5136 Other intervertebral disc degeneration, lumbar region: Secondary | ICD-10-CM | POA: Diagnosis not present

## 2018-05-27 DIAGNOSIS — M542 Cervicalgia: Secondary | ICD-10-CM

## 2018-05-27 DIAGNOSIS — M5416 Radiculopathy, lumbar region: Secondary | ICD-10-CM

## 2018-05-27 DIAGNOSIS — M47816 Spondylosis without myelopathy or radiculopathy, lumbar region: Secondary | ICD-10-CM | POA: Diagnosis not present

## 2018-05-27 DIAGNOSIS — M47818 Spondylosis without myelopathy or radiculopathy, sacral and sacrococcygeal region: Secondary | ICD-10-CM | POA: Diagnosis not present

## 2018-05-27 DIAGNOSIS — G894 Chronic pain syndrome: Secondary | ICD-10-CM | POA: Diagnosis not present

## 2018-05-27 DIAGNOSIS — M47812 Spondylosis without myelopathy or radiculopathy, cervical region: Secondary | ICD-10-CM | POA: Diagnosis not present

## 2018-05-27 DIAGNOSIS — M48062 Spinal stenosis, lumbar region with neurogenic claudication: Secondary | ICD-10-CM | POA: Diagnosis not present

## 2018-05-27 MED ORDER — HYDROCODONE-ACETAMINOPHEN 7.5-325 MG PO TABS: 1.0000 | ORAL_TABLET | Freq: Three times a day (TID) | ORAL | 0 refills | Status: DC | PRN

## 2018-05-27 NOTE — Progress Notes (Signed)
Pain Management Encounter Note - Virtual Visit via Telephone Telehealth (real-time audio visits between healthcare provider and patient).  Patient's Phone No. & Preferred Pharmacy:  [phone] (home); There is no such number on file (mobile).; (Preferred) [place] [address] [place] [address] Phone: [phone] Fax: [phone]   Pre-screening note:  Our staff contacted Michele Moore and offered her an "in person", "face-to-face" appointment versus a telephone encounter. She indicated preferring the telephone encounter, at this time.  Reason for Virtual Visit: COVID-19*  Social distancing based on CDC and AMA recommendations.   I contacted Michele Moore on 05/27/2018 at 10:35 AM by telephone and clearly identified myself as Gillis Santa, MD. I verified that I was speaking with the correct person using two identifiers (Name and date of birth: Jul 23, 1950).  Advanced Informed Consent I sought verbal advanced consent from Michele Moore for telemedicine interactions and virtual visit. I informed Michele Moore of the security and privacy concerns, risks, and limitations associated with performing an evaluation and management service by telephone. I also informed Michele Moore of the availability of "in person" appointments and I informed her of the possibility of a patient responsible charge related to this service. Michele Moore expressed understanding and agreed to proceed.   Historic Elements   Michele Moore is a 68 y.o. year old, female patient evaluated today after her last encounter by our practice on 04/12/2018. Michele Moore  has a past medical history of Allergy, Anemia, Arthritis, Asthma, Chronic kidney disease, Collagen vascular disease ([place]), Family history of adverse reaction to anesthesia, GERD (gastroesophageal reflux disease), H/O tooth extraction, Hemorrhoid, History of hiatal hernia, Hypertension,  Hypothyroidism, Migraines, Mixed hyperlipidemia, Multiple gastric ulcers, Thyroid disease, and Wears dentures. She also  has a past surgical history that includes Carpal tunnel release; Rotator cuff repair; Ankle surgery; Tubal ligation; Colonoscopy (2000?); Upper gi endoscopy (2000?); Tonsillectomy; Fusion of talonavicular joint (Right, 08/03/2015); Colonoscopy with propofol (N/A, 10/22/2017); Esophagogastroduodenoscopy (egd) with propofol (N/A, 10/22/2017); polypectomy (N/A, 10/22/2017); Joint replacement; and Total knee revision (Right, 01/20/2018). Michele Moore has a current medication list which includes the following prescription(s): albuterol, allopurinol, vitamin d3, clotrimazole, diclofenac sodium, doxycycline, ferrous sulfate, fluconazole, fluticasone, gabapentin, guaifenesin-codeine, hydralazine, hydrocodone-acetaminophen, hydrocodone-acetaminophen, hydrocodone-acetaminophen, meclizine, multiple vitamins-calcium, omega-3 acid ethyl esters, omeprazole, polyethyl glycol-propyl glycol, potassium chloride sa, propylene glycol, ranitidine, synthroid, tizanidine, triamcinolone cream, and triamterene-hydrochlorothiazide. She  reports that she quit smoking about 25 years ago. She has never used smokeless tobacco. She reports that she does not drink alcohol or use drugs. Michele Moore is allergic to ampicillin; penicillins; and vibramycin [doxycycline calcium].   HPI  I last saw her on 04/12/2018. She is being evaluated for medication management.   Pharmacotherapy Assessment   05/05/2018  2   05/05/2018  Hydrocodone-Acetamin 7.5-325  90.00 30 Bi Lat   [id number]   Wal (4612)   0  22.50 MME  Comm Ins       Monitoring: Pharmacotherapy: No side-effects or adverse reactions reported. [place] PMP: PDMP reviewed during this encounter.       Compliance: No problems identified. Plan: Refer to "POC".  Review of recent tests  DG Knee Right Port CLINICAL DATA:  Right total knee arthroplasty poly  ethylene exchange.  EXAM: PORTABLE RIGHT KNEE - 1-2 VIEW  COMPARISON:  CT of the right knee 11/16/2015. Right knee radiographs 11/16/2015  FINDINGS: Right total knee arthroplasty  is noted. New disc spacer is in place. There is gas and fluid in the joint. The knee is located. Femoral and tibial components are well seated.  IMPRESSION: Revision of right knee arthroplasty without radiographic evidence for complication.  Electronically Signed   By: San Morelle M.D.   On: 01/20/2018 11:00   Office Visit on 05/11/2018  Component Date Value Ref Range Status  . Influenza A, POC 05/11/2018 Negative  Negative Final  . Influenza B, POC 05/11/2018 Negative  Negative Final   Assessment  The primary encounter diagnosis was Lumbar radiculopathy. Diagnoses of Lumbar degenerative disc disease, Cervical spondylosis without myelopathy, Cervical facet syndrome, Chronic pain syndrome, Neck pain on left side, Spinal stenosis, lumbar region, with neurogenic claudication, SI joint arthritis, and Lumbar spondylosis were also pertinent to this visit.  Plan of Care  I discussed the assessment and treatment plan with the patient. The patient was provided an opportunity to ask questions and all were answered. The patient agreed with the plan and demonstrated an understanding of the instructions.  Refill of hydrocodone as below.  No change in dose.  Fill dates of 06/04/2018, 07/04/2018, 08/03/2018.  Patient advised to call back or seek an in-person evaluation if the symptoms or condition worsens.  I am having Michele Moore start on HYDROcodone-acetaminophen and HYDROcodone-acetaminophen. I am also having her maintain her Polyethyl Glycol-Propyl Glycol (SYSTANE OP), ferrous sulfate, Multiple Vitamins-Calcium (ONE-A-DAY WOMENS FORMULA PO), allopurinol, diclofenac sodium, Vitamin D3, meclizine, albuterol, hydrALAZINE, triamcinolone cream, Propylene Glycol (SYSTANE BALANCE OP), potassium chloride SA,  fluticasone, tiZANidine, gabapentin, omega-3 acid ethyl esters, triamterene-hydrochlorothiazide, ranitidine, clotrimazole, fluconazole, Synthroid, guaiFENesin-codeine, doxycycline, omeprazole, and HYDROcodone-acetaminophen. Pharmacotherapy (Medications Ordered): Meds ordered this encounter  Medications  . HYDROcodone-acetaminophen (NORCO) 7.5-325 MG tablet    Sig: Take 1 tablet by mouth every 8 (eight) hours as needed for up to 30 days for moderate pain.    Dispense:  90 tablet    Refill:  0    Do not place this medication, or any other prescription from our practice, on "Automatic Refill". Patient may have prescription filled one day early if pharmacy is closed on scheduled refill date.  Marland Kitchen HYDROcodone-acetaminophen (NORCO) 7.5-325 MG tablet    Sig: Take 1 tablet by mouth every 8 (eight) hours as needed for up to 30 days for severe pain. Must last 30 days.    Dispense:  90 tablet    Refill:  0    [place] STOP ACT - Not applicable. Fill one day early if pharmacy is closed on scheduled refill date.  Marland Kitchen HYDROcodone-acetaminophen (NORCO) 7.5-325 MG tablet    Sig: Take 1 tablet by mouth every 8 (eight) hours as needed for up to 30 days for severe pain. Must last 30 days.    Dispense:  90 tablet    Refill:  0    [place] STOP ACT - Not applicable. Fill one day early if pharmacy is closed on scheduled refill date.   Orders:  No orders of the defined types were placed in this encounter.  Follow-up plan:   Return in about 3 months (around 08/26/2018) for Medication Management.   Total duration of non-face-to-face encounter: 21 minutes.  Note by: Gillis Santa, MD Date: 05/27/2018; Time: 10:35 AM  Disclaimer:  * Given the special circumstances of the COVID-19 pandemic, the federal government has announced that the Office for Civil Rights (OCR) will exercise its enforcement discretion and will not impose penalties on physicians using telehealth in the event of noncompliance with regulatory requirements  under the  [employer] and Accountability Act (HIPAA) in connection with the good faith provision of telehealth during the AESLP-53 national public health emergency. (AMA)

## 2018-06-03 DIAGNOSIS — L304 Erythema intertrigo: Secondary | ICD-10-CM | POA: Diagnosis not present

## 2018-06-03 DIAGNOSIS — L400 Psoriasis vulgaris: Secondary | ICD-10-CM | POA: Diagnosis not present

## 2018-06-14 ENCOUNTER — Ambulatory Visit: Payer: Medicare Other | Admitting: Student in an Organized Health Care Education/Training Program

## 2018-06-15 DIAGNOSIS — L400 Psoriasis vulgaris: Secondary | ICD-10-CM | POA: Diagnosis not present

## 2018-06-17 DIAGNOSIS — L400 Psoriasis vulgaris: Secondary | ICD-10-CM | POA: Diagnosis not present

## 2018-06-21 ENCOUNTER — Other Ambulatory Visit: Payer: Self-pay

## 2018-06-21 DIAGNOSIS — E785 Hyperlipidemia, unspecified: Secondary | ICD-10-CM

## 2018-06-21 DIAGNOSIS — Z6835 Body mass index (BMI) 35.0-35.9, adult: Principal | ICD-10-CM

## 2018-06-21 MED ORDER — ATORVASTATIN CALCIUM 10 MG PO TABS: 10.0000 mg | ORAL_TABLET | Freq: Every day | ORAL | 1 refills | Status: DC

## 2018-06-22 DIAGNOSIS — L400 Psoriasis vulgaris: Secondary | ICD-10-CM | POA: Diagnosis not present

## 2018-06-24 DIAGNOSIS — L400 Psoriasis vulgaris: Secondary | ICD-10-CM | POA: Diagnosis not present

## 2018-06-30 DIAGNOSIS — L304 Erythema intertrigo: Secondary | ICD-10-CM | POA: Diagnosis not present

## 2018-06-30 DIAGNOSIS — L400 Psoriasis vulgaris: Secondary | ICD-10-CM | POA: Diagnosis not present

## 2018-06-30 DIAGNOSIS — L4052 Psoriatic arthritis mutilans: Secondary | ICD-10-CM | POA: Diagnosis not present

## 2018-07-01 ENCOUNTER — Other Ambulatory Visit: Payer: Self-pay

## 2018-07-01 ENCOUNTER — Other Ambulatory Visit: Payer: Self-pay | Admitting: Student in an Organized Health Care Education/Training Program

## 2018-07-01 DIAGNOSIS — I10 Essential (primary) hypertension: Secondary | ICD-10-CM | POA: Diagnosis not present

## 2018-07-01 DIAGNOSIS — N2581 Secondary hyperparathyroidism of renal origin: Secondary | ICD-10-CM | POA: Diagnosis not present

## 2018-07-01 DIAGNOSIS — D631 Anemia in chronic kidney disease: Secondary | ICD-10-CM | POA: Diagnosis not present

## 2018-07-01 DIAGNOSIS — N183 Chronic kidney disease, stage 3 (moderate): Secondary | ICD-10-CM | POA: Diagnosis not present

## 2018-07-01 MED ORDER — OMEGA-3-ACID ETHYL ESTERS 1 G PO CAPS: 1.0000 g | ORAL_CAPSULE | Freq: Two times a day (BID) | ORAL | 1 refills | Status: DC

## 2018-07-01 NOTE — Progress Notes (Unsigned)
Switch back to Lovaza due to pt stating Lipitor is giving her headaches

## 2018-07-06 DIAGNOSIS — L400 Psoriasis vulgaris: Secondary | ICD-10-CM | POA: Diagnosis not present

## 2018-07-08 DIAGNOSIS — L400 Psoriasis vulgaris: Secondary | ICD-10-CM | POA: Diagnosis not present

## 2018-07-09 ENCOUNTER — Other Ambulatory Visit: Payer: Self-pay | Admitting: Family Medicine

## 2018-07-09 DIAGNOSIS — I10 Essential (primary) hypertension: Secondary | ICD-10-CM

## 2018-07-12 DIAGNOSIS — L400 Psoriasis vulgaris: Secondary | ICD-10-CM | POA: Diagnosis not present

## 2018-07-13 DIAGNOSIS — G8929 Other chronic pain: Secondary | ICD-10-CM | POA: Diagnosis not present

## 2018-07-13 DIAGNOSIS — Z96651 Presence of right artificial knee joint: Secondary | ICD-10-CM | POA: Diagnosis not present

## 2018-07-13 DIAGNOSIS — M25561 Pain in right knee: Secondary | ICD-10-CM | POA: Diagnosis not present

## 2018-07-14 ENCOUNTER — Ambulatory Visit (INDEPENDENT_AMBULATORY_CARE_PROVIDER_SITE_OTHER): Payer: Medicare Other | Admitting: Family Medicine

## 2018-07-14 ENCOUNTER — Other Ambulatory Visit: Payer: Self-pay

## 2018-07-14 ENCOUNTER — Encounter: Payer: Self-pay | Admitting: Family Medicine

## 2018-07-14 VITALS — BP 128/80 | HR 72 | Ht 71.0 in | Wt 271.0 lb

## 2018-07-14 DIAGNOSIS — R69 Illness, unspecified: Secondary | ICD-10-CM

## 2018-07-14 DIAGNOSIS — K219 Gastro-esophageal reflux disease without esophagitis: Secondary | ICD-10-CM

## 2018-07-14 DIAGNOSIS — I10 Essential (primary) hypertension: Secondary | ICD-10-CM

## 2018-07-14 DIAGNOSIS — E782 Mixed hyperlipidemia: Secondary | ICD-10-CM | POA: Diagnosis not present

## 2018-07-14 DIAGNOSIS — R21 Rash and other nonspecific skin eruption: Secondary | ICD-10-CM | POA: Diagnosis not present

## 2018-07-14 DIAGNOSIS — D509 Iron deficiency anemia, unspecified: Secondary | ICD-10-CM

## 2018-07-14 DIAGNOSIS — E876 Hypokalemia: Secondary | ICD-10-CM

## 2018-07-14 DIAGNOSIS — L400 Psoriasis vulgaris: Secondary | ICD-10-CM | POA: Diagnosis not present

## 2018-07-14 DIAGNOSIS — E039 Hypothyroidism, unspecified: Secondary | ICD-10-CM | POA: Diagnosis not present

## 2018-07-14 MED ORDER — HYDRALAZINE HCL 50 MG PO TABS: 50.0000 mg | ORAL_TABLET | Freq: Two times a day (BID) | ORAL | 5 refills | Status: DC

## 2018-07-14 MED ORDER — TRIAMTERENE-HCTZ 75-50 MG PO TABS: 1.0000 | ORAL_TABLET | Freq: Every day | ORAL | 5 refills | Status: DC

## 2018-07-14 MED ORDER — OMEGA-3-ACID ETHYL ESTERS 1 G PO CAPS: 1.0000 g | ORAL_CAPSULE | Freq: Two times a day (BID) | ORAL | 5 refills | Status: DC

## 2018-07-14 MED ORDER — LEVOTHYROXINE SODIUM 200 MCG PO TABS: ORAL_TABLET | ORAL | 5 refills | Status: DC

## 2018-07-14 MED ORDER — OMEPRAZOLE 40 MG PO CPDR: DELAYED_RELEASE_CAPSULE | ORAL | 5 refills | Status: DC

## 2018-07-14 MED ORDER — POTASSIUM CHLORIDE CRYS ER 20 MEQ PO TBCR: 10.0000 meq | EXTENDED_RELEASE_TABLET | Freq: Every day | ORAL | 5 refills | Status: DC

## 2018-07-14 NOTE — Progress Notes (Signed)
Date:  07/14/2018   Name:  Michele Moore   DOB:  1950-03-13   MRN:  [id number]   Chief Complaint: Hypertension; Hyperlipidemia; Hypothyroidism; Gastroesophageal Reflux; and hypokalemia  Hypertension  This is a chronic problem. The current episode started more than 1 year ago. The problem is unchanged. The problem is controlled. Pertinent negatives include no anxiety, blurred vision, chest pain, headaches, malaise/fatigue, neck pain, orthopnea, palpitations, peripheral edema, PND, shortness of breath or sweats. There are no associated agents to hypertension. Risk factors for coronary artery disease include dyslipidemia and obesity. Past treatments include diuretics (hydralazine). The current treatment provides mild improvement. There are no compliance problems.  There is no history of angina, kidney disease, CAD/MI, CVA, heart failure, left ventricular hypertrophy or retinopathy. Identifiable causes of hypertension include a thyroid problem. There is no history of chronic renal disease, a hypertension causing med or renovascular disease.  Hyperlipidemia  This is a chronic problem. The current episode started more than 1 year ago. The problem is controlled. Recent lipid tests were reviewed and are normal. Exacerbating diseases include hypothyroidism and obesity. She has no history of chronic renal disease or liver disease. Pertinent negatives include no chest pain, myalgias or shortness of breath. Treatments tried: omega 3. The current treatment provides moderate improvement of lipids. There are no compliance problems.   Gastroesophageal Reflux  She complains of a hoarse voice. She reports no abdominal pain, no chest pain, no coughing, no early satiety, no heartburn, no nausea, no sore throat or no wheezing. This is a chronic problem. The problem occurs occasionally. The symptoms are aggravated by certain foods. Pertinent negatives include no fatigue. She has tried a PPI for the symptoms.  Thyroid  Problem  Presents for follow-up visit. Symptoms include hair loss and hoarse voice. Patient reports no anxiety, cold intolerance, constipation, depressed mood, diaphoresis, diarrhea, fatigue, leg swelling, palpitations or weight gain. The symptoms have been stable. Her past medical history is significant for hyperlipidemia. There is no history of heart failure.  Anemia  Presents for follow-up visit. There has been no abdominal pain, bruising/bleeding easily, confusion, fever, leg swelling, light-headedness, malaise/fatigue or palpitations. Past medical history includes hypothyroidism. There is no history of chronic renal disease or heart failure. There are no compliance problems.     Review of Systems  Constitutional: Negative for chills, diaphoresis, fatigue, fever, malaise/fatigue and weight gain.  HENT: Positive for hoarse voice. Negative for drooling, ear discharge, ear pain and sore throat.   Eyes: Negative for blurred vision.  Respiratory: Negative for cough, shortness of breath and wheezing.   Cardiovascular: Negative for chest pain, palpitations, orthopnea, leg swelling and PND.  Gastrointestinal: Negative for abdominal pain, blood in stool, constipation, diarrhea, heartburn and nausea.  Endocrine: Negative for cold intolerance and polydipsia.  Genitourinary: Negative for dysuria, frequency, hematuria and urgency.  Musculoskeletal: Negative for back pain, myalgias and neck pain.  Skin: Negative for rash.  Allergic/Immunologic: Negative for environmental allergies.  Neurological: Negative for dizziness, light-headedness and headaches.  Hematological: Does not bruise/bleed easily.  Psychiatric/Behavioral: Negative for confusion and suicidal ideas. The patient is not nervous/anxious.     Patient Active Problem List   Diagnosis Date Noted  . Female pelvic pain 01/20/2018  . History of revision of total knee arthroplasty 01/20/2018  . Iron deficiency anemia due to chronic blood loss    . Heme + stool   . Acute gastritis without hemorrhage   . Gastric polyps   . Benign neoplasm of ascending colon   .  Benign neoplasm of transverse colon   . Polyp of sigmoid colon   . Hypertension 10/01/2017  . Hypothyroidism 10/01/2017  . Gastroesophageal reflux disease 10/01/2017  . Hypokalemia 10/01/2017  . Mixed hyperlipidemia 10/01/2017  . Reactive airway disease 10/01/2017  . Bradycardia 08/24/2017  . Severe obesity (BMI 35.0-35.9 with comorbidity) ([place]) 08/18/2017  . Chronic gouty arthropathy without tophi 06/25/2017  . Encounter for long-term (current) use of high-risk medication 06/25/2017  . Positive ANA (antinuclear antibody) 06/17/2017  . Cervical facet syndrome 06/16/2017  . Osteoarthritis of right shoulder due to rotator cuff injury 06/16/2017  . Sprain of anterior talofibular ligament of right ankle 06/16/2017  . Lumbar radiculopathy 04/21/2017  . Lumbar degenerative disc disease 04/21/2017  . Chronic pain syndrome 04/21/2017  . Spinal stenosis, lumbar region, with neurogenic claudication 04/21/2017  . SS-A antibody positive 03/05/2017  . Pain, lower extremity 08/03/2015  . DOE (dyspnea on exertion) 12/28/2013    Allergies  Allergen Reactions  . Ampicillin Swelling  . Penicillins Anaphylaxis and Swelling    Has patient had a PCN reaction causing immediate rash, facial/tongue/throat swelling, SOB or lightheadedness with hypotension: Yes Has patient had a PCN reaction causing severe rash involving mucus membranes or skin necrosis: No Has patient had a PCN reaction that required hospitalization: No Has patient had a PCN reaction occurring within the last 10 years: Yes If all of the above answers are "NO", then may proceed with Cephalosporin use.  . Vibramycin [Doxycycline Calcium] Rash    Past Surgical History:  Procedure Laterality Date  . ANKLE SURGERY    . CARPAL TUNNEL RELEASE     x3  . COLONOSCOPY  2000?  . COLONOSCOPY WITH PROPOFOL N/A 10/22/2017    Procedure: COLONOSCOPY WITH PROPOFOL;  Surgeon: Lucilla Lame, MD;  Location: [facility];  Service: Endoscopy;  Laterality: N/A;  . ESOPHAGOGASTRODUODENOSCOPY (EGD) WITH PROPOFOL N/A 10/22/2017   Procedure: ESOPHAGOGASTRODUODENOSCOPY (EGD) WITH PROPOFOL;  Surgeon: Lucilla Lame, MD;  Location: [facility];  Service: Endoscopy;  Laterality: N/A;  . FUSION OF TALONAVICULAR JOINT Right 08/03/2015   Procedure: TAILOR NAVICULAR JOINT FUSION - RIGHT ;  Surgeon: [name], DPM;  Location: [facility];  Service: Podiatry;  Laterality: Right;  . JOINT REPLACEMENT     knee x 3 ,right x1 and left x2  . POLYPECTOMY N/A 10/22/2017   Procedure: POLYPECTOMY;  Surgeon: Lucilla Lame, MD;  Location: [facility];  Service: Endoscopy;  Laterality: N/A;  . ROTATOR CUFF REPAIR    . TONSILLECTOMY    . TOTAL KNEE REVISION Right 01/20/2018   Procedure: POLYETHYLENE EXCHANGE;  Surgeon: Dereck Leep, MD;  Location: [facility];  Service: Orthopedics;  Laterality: Right;  . TUBAL LIGATION    . UPPER GI ENDOSCOPY  2000?    Social History   Tobacco Use  . Smoking status: Former Smoker    Last attempt to quit: 02/24/1993    Years since quitting: 25.4  . Smokeless tobacco: Never Used  Substance Use Topics  . Alcohol use: No    Alcohol/week: 0.0 standard drinks  . Drug use: No     Medication list has been reviewed and updated.  Current Meds  Medication Sig  . albuterol (PROVENTIL HFA;VENTOLIN HFA) 108 (90 Base) MCG/ACT inhaler Inhale 2 puffs into the lungs every 4 (four) hours as needed for wheezing.  Marland Kitchen allopurinol (ZYLOPRIM) 100 MG tablet Take 2 tablets by mouth daily. Dr Barb Merino  . Cholecalciferol (VITAMIN D3) 2000 units TABS Take 2,000 Units by mouth  daily.  . clotrimazole (LOTRIMIN) 1 % cream Apply to affected area 2 times daily  . diclofenac sodium (VOLTAREN) 1 % GEL Apply 2 g topically 3 (three) times daily. [facility]  . ferrous sulfate 325 (65 FE) MG tablet Take 325 mg by  mouth daily with breakfast. otc  . fluticasone (FLONASE) 50 MCG/ACT nasal spray SHAKE LIQUID AND USE 1 SPRAY IN EACH NOSTRIL DAILY  . gabapentin (NEURONTIN) 300 MG capsule Take 2 capsules (600 mg total) by mouth at bedtime. (Patient taking differently: Take 600 mg by mouth at bedtime. Pain center)  . hydrALAZINE (APRESOLINE) 50 MG tablet TAKE 1 TABLET(50 MG) BY MOUTH TWICE DAILY  . HYDROcodone-acetaminophen (NORCO) 7.5-325 MG tablet Take 1 tablet by mouth every 8 (eight) hours as needed for up to 30 days for severe pain. Must last 30 days. (Patient taking differently: Take 1 tablet by mouth every 8 (eight) hours as needed for severe pain. Must last 30 days./ pain center)  . [START ON 08/03/2018] HYDROcodone-acetaminophen (NORCO) 7.5-325 MG tablet Take 1 tablet by mouth every 8 (eight) hours as needed for up to 30 days for severe pain. Must last 30 days.  . meclizine (ANTIVERT) 12.5 MG tablet Take 1 tablet (12.5 mg total) by mouth 2 (two) times daily as needed for dizziness.  . Multiple Vitamins-Calcium (ONE-A-DAY WOMENS FORMULA PO) Take 1 tablet by mouth daily.  Marland Kitchen omega-3 acid ethyl esters (LOVAZA) 1 g capsule Take 1 capsule (1 g total) by mouth 2 (two) times daily.  Marland Kitchen omeprazole (PRILOSEC) 40 MG capsule TAKE 1 CAPSULE(40 MG) BY MOUTH DAILY  . Polyethyl Glycol-Propyl Glycol (SYSTANE OP) Place 1 drop into both eyes daily as needed (dry eyes).   . potassium chloride SA (K-DUR,KLOR-CON) 20 MEQ tablet Take 0.5 tablets (10 mEq total) by mouth daily.  Marland Kitchen Propylene Glycol (SYSTANE BALANCE OP) Apply 1 drop to eye 2 (two) times daily.  . ranitidine (ZANTAC) 150 MG tablet TAKE 1 TABLET(150 MG) BY MOUTH TWICE DAILY  . SYNTHROID 200 MCG tablet TAKE 1 TABLET(200 MCG) BY MOUTH DAILY BEFORE BREAKFAST  . tiZANidine (ZANAFLEX) 4 MG capsule Take 1 capsule (4 mg total) by mouth 2 (two) times daily as needed. (Patient taking differently: Take 4 mg by mouth 2 (two) times daily as needed. Pain clinic)  . triamcinolone cream  (KENALOG) 0.1 % Apply 1 application topically every morning.  . triamterene-hydrochlorothiazide (MAXZIDE) 75-50 MG tablet TAKE 1 TABLET BY MOUTH DAILY    PHQ 2/9 Scores 07/14/2018 03/30/2018 03/01/2018 12/29/2017  PHQ - 2 Score 0 0 0 0  PHQ- 9 Score 0 - - -    BP Readings from Last 3 Encounters:  07/14/18 128/80  05/21/18 111/60  05/13/18 136/85    Physical Exam Vitals signs and nursing note reviewed.  Constitutional:      Appearance: She is well-developed.  HENT:     Head: Normocephalic.     Right Ear: External ear normal.     Left Ear: External ear normal.  Eyes:     General: Lids are everted, no foreign bodies appreciated. No scleral icterus.       Left eye: No foreign body or hordeolum.     Conjunctiva/sclera: Conjunctivae normal.     Right eye: Right conjunctiva is not injected.     Left eye: Left conjunctiva is not injected.     Pupils: Pupils are equal, round, and reactive to light.  Neck:     Musculoskeletal: Normal range of motion and neck  supple.     Thyroid: No thyromegaly.     Vascular: No JVD.     Trachea: No tracheal deviation.  Cardiovascular:     Rate and Rhythm: Normal rate and regular rhythm.     Chest Wall: PMI is displaced.     Heart sounds: Normal heart sounds, S1 normal and S2 normal. No murmur. No systolic murmur. No diastolic murmur. No friction rub. No gallop. No S3 or S4 sounds.   Pulmonary:     Effort: Pulmonary effort is normal. No respiratory distress.     Breath sounds: Normal breath sounds. No decreased air movement or transmitted upper airway sounds. No wheezing, rhonchi or rales.  Abdominal:     General: Bowel sounds are normal.     Palpations: Abdomen is soft. There is no mass.     Tenderness: There is no abdominal tenderness. There is no guarding or rebound.  Musculoskeletal: Normal range of motion.        General: No tenderness.  Lymphadenopathy:     Cervical: No cervical adenopathy.  Skin:    General: Skin is warm.     Findings: No  rash.  Neurological:     Mental Status: She is alert and oriented to person, place, and time.     Cranial Nerves: No cranial nerve deficit.     Deep Tendon Reflexes: Reflexes normal.  Psychiatric:        Mood and Affect: Mood is not anxious or depressed.     Wt Readings from Last 3 Encounters:  07/14/18 271 lb (122.9 kg)  05/21/18 261 lb (118.4 kg)  05/13/18 261 lb (118.4 kg)    BP 128/80   Pulse 72   Ht 5\' 11"  (1.803 m)   Wt 271 lb (122.9 kg)   BMI 37.80 kg/m   Assessment and Plan: 1. Hypertension, unspecified type Chronic.  Controlled.  Continue hydralazine 50 mg twice a day and triamterene hydrochlorothiazide 75-50 once a day will check renal function panel. - hydrALAZINE (APRESOLINE) 50 MG tablet; Take 1 tablet (50 mg total) by mouth 2 (two) times daily.  Dispense: 60 tablet; Refill: 5 - triamterene-hydrochlorothiazide (MAXZIDE) 75-50 MG tablet; Take 1 tablet by mouth daily.  Dispense: 30 tablet; Refill: 5 - Renal Function Panel  2. Gastroesophageal reflux disease, esophagitis presence not specified Chronic.  Controlled.  Patient is unable to continue Zantac due to product  unavailability.  Will continue omeprazole 40 mg once a day which is controlling symptoms very well. - omeprazole (PRILOSEC) 40 MG capsule; TAKE 1 CAPSULE(40 MG) BY MOUTH DAILY  Dispense: 30 capsule; Refill: 5  3. Hypokalemia Chronic.  Controlled.  Secondary to diuretic will continue 10 mEq KCl and check a renal function panel for evaluation of potassium status. - potassium chloride SA (K-DUR) 20 MEQ tablet; Take 0.5 tablets (10 mEq total) by mouth daily.  Dispense: 30 tablet; Refill: 5 - Renal Function Panel  4. Hypothyroidism, unspecified type Chronic.  Controlled.  Will check a TSH with thyroid panel and adjust thyroid accordingly. - levothyroxine (SYNTHROID) 200 MCG tablet; TAKE 1 TABLET(200 MCG) BY MOUTH DAILY BEFORE BREAKFAST  Dispense: 30 tablet; Refill: 5 - Thyroid Panel With TSH  5. Mixed  hyperlipidemia Chronic.  Controlled.  Continue Lovaza 1 g daily we will check lipid panel for evaluation. - omega-3 acid ethyl esters (LOVAZA) 1 g capsule; Take 1 capsule (1 g total) by mouth 2 (two) times daily.  Dispense: 60 capsule; Refill: 5 - Lipid Panel With LDL/HDL Ratio  6.  Taking medication for chronic disease Patient has history of taking occasions that we need to check hepatic function panel to rule out nephrotoxicity.  Patient is on Lovaza for lipid control. - Hepatic function panel  7. Iron deficiency anemia, unspecified iron deficiency anemia type Patient has a history of iron deficiency anemia which she takes an iron supplement.  Will check a CBC to evaluate hemoglobin status. - CBC with Differential/Platelet

## 2018-07-15 ENCOUNTER — Other Ambulatory Visit: Payer: Self-pay

## 2018-07-15 DIAGNOSIS — E039 Hypothyroidism, unspecified: Secondary | ICD-10-CM

## 2018-07-15 LAB — RENAL FUNCTION PANEL
Albumin: 4.4 g/dL (ref 3.8–4.8)
BUN/Creatinine Ratio: 15 (ref 12–28)
BUN: 26 mg/dL (ref 8–27)
CO2: 21 mmol/L (ref 20–29)
Calcium: 9.4 mg/dL (ref 8.7–10.3)
Chloride: 108 mmol/L — ABNORMAL HIGH (ref 96–106)
Creatinine, Ser: 1.69 mg/dL — ABNORMAL HIGH (ref 0.57–1.00)
GFR calc Af Amer: 35 mL/min/{1.73_m2} — ABNORMAL LOW (ref >59)
GFR calc non Af Amer: 31 mL/min/{1.73_m2} — ABNORMAL LOW (ref >59)
Glucose: 92 mg/dL (ref 65–99)
Phosphorus: 3.3 mg/dL (ref 3.0–4.3)
Potassium: 4.1 mmol/L (ref 3.5–5.2)
Sodium: 143 mmol/L (ref 134–144)

## 2018-07-15 LAB — CBC WITH DIFFERENTIAL/PLATELET
Basophils Absolute: 0 10*3/uL (ref 0.0–0.2)
Basos: 1 %
EOS (ABSOLUTE): 0.3 10*3/uL (ref 0.0–0.4)
Eos: 7 %
Hematocrit: 28.5 % — ABNORMAL LOW (ref 34.0–46.6)
Hemoglobin: 9.6 g/dL — ABNORMAL LOW (ref 11.1–15.9)
Immature Grans (Abs): 0 10*3/uL (ref 0.0–0.1)
Immature Granulocytes: 0 %
Lymphocytes Absolute: 1 10*3/uL (ref 0.7–3.1)
Lymphs: 24 %
MCH: 29.9 pg (ref 26.6–33.0)
MCHC: 33.7 g/dL (ref 31.5–35.7)
MCV: 89 fL (ref 79–97)
Monocytes Absolute: 0.2 10*3/uL (ref 0.1–0.9)
Monocytes: 4 %
Neutrophils Absolute: 2.7 10*3/uL (ref 1.4–7.0)
Neutrophils: 64 %
Platelets: 182 10*3/uL (ref 150–450)
RBC: 3.21 x10E6/uL — ABNORMAL LOW (ref 3.77–5.28)
RDW: 15.1 % (ref 11.7–15.4)
WBC: 4.2 10*3/uL (ref 3.4–10.8)

## 2018-07-15 LAB — LIPID PANEL WITH LDL/HDL RATIO
Cholesterol, Total: 182 mg/dL (ref 100–199)
HDL: 53 mg/dL (ref >39)
LDL Calculated: 113 mg/dL — ABNORMAL HIGH (ref 0–99)
LDl/HDL Ratio: 2.1 ratio (ref 0.0–3.2)
Triglycerides: 82 mg/dL (ref 0–149)
VLDL Cholesterol Cal: 16 mg/dL (ref 5–40)

## 2018-07-15 LAB — HEPATIC FUNCTION PANEL
ALT: 21 IU/L (ref 0–32)
AST: 24 IU/L (ref 0–40)
Alkaline Phosphatase: 89 IU/L (ref 39–117)
Bilirubin Total: 0.3 mg/dL (ref 0.0–1.2)
Bilirubin, Direct: 0.08 mg/dL (ref 0.00–0.40)
Total Protein: 6.4 g/dL (ref 6.0–8.5)

## 2018-07-15 LAB — THYROID PANEL WITH TSH
Free Thyroxine Index: 1.7 (ref 1.2–4.9)
T3 Uptake Ratio: 22 % — ABNORMAL LOW (ref 24–39)
T4, Total: 7.9 ug/dL (ref 4.5–12.0)
TSH: 7.45 u[IU]/mL — ABNORMAL HIGH (ref 0.450–4.500)

## 2018-07-15 MED ORDER — LEVOTHYROXINE SODIUM 25 MCG PO TABS: 25.0000 ug | ORAL_TABLET | Freq: Every day | ORAL | 1 refills | Status: DC

## 2018-07-20 DIAGNOSIS — L400 Psoriasis vulgaris: Secondary | ICD-10-CM | POA: Diagnosis not present

## 2018-07-22 ENCOUNTER — Other Ambulatory Visit: Payer: Self-pay | Admitting: Student in an Organized Health Care Education/Training Program

## 2018-07-22 DIAGNOSIS — L409 Psoriasis, unspecified: Secondary | ICD-10-CM | POA: Diagnosis not present

## 2018-07-22 DIAGNOSIS — M1A00X0 Idiopathic chronic gout, unspecified site, without tophus (tophi): Secondary | ICD-10-CM | POA: Diagnosis not present

## 2018-07-22 DIAGNOSIS — N183 Chronic kidney disease, stage 3 (moderate): Secondary | ICD-10-CM | POA: Diagnosis not present

## 2018-07-22 DIAGNOSIS — Z79899 Other long term (current) drug therapy: Secondary | ICD-10-CM | POA: Diagnosis not present

## 2018-07-22 DIAGNOSIS — L4050 Arthropathic psoriasis, unspecified: Secondary | ICD-10-CM | POA: Diagnosis not present

## 2018-07-22 DIAGNOSIS — R768 Other specified abnormal immunological findings in serum: Secondary | ICD-10-CM | POA: Diagnosis not present

## 2018-07-28 ENCOUNTER — Telehealth: Payer: Self-pay | Admitting: *Deleted

## 2018-07-28 NOTE — Telephone Encounter (Signed)
Attempted to call patient to ask about the muscle relaxer. Message left.

## 2018-07-28 NOTE — Telephone Encounter (Signed)
You last gave her Skelaxin 12/2018. I attemtped to call to ask if that is what she wants, message left. Will you send this in?

## 2018-07-28 NOTE — Telephone Encounter (Signed)
After you have confirmed med with Ms Bitterman, I'm happy to call it in.

## 2018-07-29 ENCOUNTER — Other Ambulatory Visit: Payer: Self-pay | Admitting: Student in an Organized Health Care Education/Training Program

## 2018-07-29 MED ORDER — TIZANIDINE HCL 4 MG PO CAPS: 4.0000 mg | ORAL_CAPSULE | Freq: Two times a day (BID) | ORAL | 2 refills | Status: DC | PRN

## 2018-07-29 NOTE — Progress Notes (Unsigned)
Requested Prescriptions   Signed Prescriptions Disp Refills  . tiZANidine (ZANAFLEX) 4 MG capsule 60 capsule 2    Sig: Take 1 capsule (4 mg total) by mouth 2 (two) times daily as needed.   Sent to pharmacy

## 2018-07-29 NOTE — Telephone Encounter (Signed)
I spoke with Michele Moore, she takes Tizanidine 4 mg, two times per day.

## 2018-08-03 DIAGNOSIS — L400 Psoriasis vulgaris: Secondary | ICD-10-CM | POA: Diagnosis not present

## 2018-08-03 DIAGNOSIS — K136 Irritative hyperplasia of oral mucosa: Secondary | ICD-10-CM | POA: Diagnosis not present

## 2018-08-03 DIAGNOSIS — K130 Diseases of lips: Secondary | ICD-10-CM | POA: Diagnosis not present

## 2018-08-09 ENCOUNTER — Other Ambulatory Visit: Payer: Self-pay

## 2018-08-09 ENCOUNTER — Emergency Department
Admission: EM | Admit: 2018-08-09 | Discharge: 2018-08-09 | Disposition: A | Discharge status: Home / Self Care | Payer: Medicare Other | Attending: Emergency Medicine | Admitting: Emergency Medicine

## 2018-08-09 ENCOUNTER — Encounter: Payer: Self-pay | Admitting: Emergency Medicine

## 2018-08-09 DIAGNOSIS — M545 Low back pain: Secondary | ICD-10-CM | POA: Diagnosis not present

## 2018-08-09 DIAGNOSIS — J45909 Unspecified asthma, uncomplicated: Secondary | ICD-10-CM | POA: Insufficient documentation

## 2018-08-09 DIAGNOSIS — Z880 Allergy status to penicillin: Secondary | ICD-10-CM | POA: Insufficient documentation

## 2018-08-09 DIAGNOSIS — R300 Dysuria: Secondary | ICD-10-CM | POA: Diagnosis not present

## 2018-08-09 DIAGNOSIS — I129 Hypertensive chronic kidney disease with stage 1 through stage 4 chronic kidney disease, or unspecified chronic kidney disease: Secondary | ICD-10-CM | POA: Insufficient documentation

## 2018-08-09 DIAGNOSIS — N3090 Cystitis, unspecified without hematuria: Secondary | ICD-10-CM | POA: Diagnosis not present

## 2018-08-09 DIAGNOSIS — E039 Hypothyroidism, unspecified: Secondary | ICD-10-CM | POA: Insufficient documentation

## 2018-08-09 DIAGNOSIS — Z79899 Other long term (current) drug therapy: Secondary | ICD-10-CM | POA: Insufficient documentation

## 2018-08-09 DIAGNOSIS — N183 Chronic kidney disease, stage 3 (moderate): Secondary | ICD-10-CM | POA: Diagnosis not present

## 2018-08-09 DIAGNOSIS — R350 Frequency of micturition: Secondary | ICD-10-CM | POA: Diagnosis present

## 2018-08-09 DIAGNOSIS — Z87891 Personal history of nicotine dependence: Secondary | ICD-10-CM | POA: Diagnosis not present

## 2018-08-09 LAB — URINALYSIS, COMPLETE (UACMP) WITH MICROSCOPIC
Bacteria, UA: NONE SEEN
Bilirubin Urine: NEGATIVE
Glucose, UA: NEGATIVE mg/dL
Hgb urine dipstick: NEGATIVE
Ketones, ur: NEGATIVE mg/dL
Nitrite: NEGATIVE
Protein, ur: NEGATIVE mg/dL
Specific Gravity, Urine: 1.012 (ref 1.005–1.030)
pH: 6 (ref 5.0–8.0)

## 2018-08-09 MED ORDER — LEVOFLOXACIN 750 MG PO TABS: 750.0000 mg | ORAL_TABLET | Freq: Every day | ORAL | 0 refills | Status: AC

## 2018-08-09 MED ORDER — LEVOFLOXACIN 750 MG PO TABS
750.0000 mg | ORAL_TABLET | Freq: Once | ORAL | Status: AC
  Administered 2018-08-09: 23:00:00 750 mg via ORAL
  Filled 2018-08-09: qty 1

## 2018-08-09 NOTE — ED Provider Notes (Signed)
[facility] Emergency Department Provider Note  ____________________________________________  Time seen: Approximately 11:31 PM  I have reviewed the triage vital signs and the nursing notes.   HISTORY  Chief Complaint Urinary Frequency and Dysuria    HPI Michele Moore is a 68 y.o. female presents to the emergency department with dysuria and increased urinary frequency for the past 3 days.  Patient also states that she has had some low back discomfort bilaterally but no back pain.  She reports suprapubic discomfort.  Patient states that she has experienced similar symptoms in the past when she has had a UTI.  Patient has also had a renal stone in the past and states that her current symptoms feel different and not as severe.  Patient denies fever and chills at home.  She denies nausea or vomiting.  No other alleviating measures have been attempted.        Past Medical History:  Diagnosis Date  . Allergy   . Anemia   . Arthritis   . Asthma   . Chronic kidney disease    STAGE 3 PER DR EASON 08/02/15  . Collagen vascular disease ([place])   . Family history of adverse reaction to anesthesia    sister difficult to put to sleep  . GERD (gastroesophageal reflux disease)   . H/O tooth extraction    all lower teeth 1/19  . Hemorrhoid   . History of hiatal hernia   . Hypertension   . Hypothyroidism   . Migraines   . Mixed hyperlipidemia   . Multiple gastric ulcers   . Thyroid disease   . Wears dentures    partial upper and lower    Patient Active Problem List   Diagnosis Date Noted  . Female pelvic pain 01/20/2018  . History of revision of total knee arthroplasty 01/20/2018  . Iron deficiency anemia   . Heme + stool   . Acute gastritis without hemorrhage   . Gastric polyps   . Benign neoplasm of ascending colon   . Benign neoplasm of transverse colon   . Polyp of sigmoid colon   . Hypertension 10/01/2017  . Hypothyroidism 10/01/2017  .  Gastroesophageal reflux disease 10/01/2017  . Hypokalemia 10/01/2017  . Mixed hyperlipidemia 10/01/2017  . Reactive airway disease 10/01/2017  . Bradycardia 08/24/2017  . Severe obesity (BMI 35.0-35.9 with comorbidity) ([place]) 08/18/2017  . Chronic gouty arthropathy without tophi 06/25/2017  . Encounter for long-term (current) use of high-risk medication 06/25/2017  . Positive ANA (antinuclear antibody) 06/17/2017  . Cervical facet syndrome 06/16/2017  . Osteoarthritis of right shoulder due to rotator cuff injury 06/16/2017  . Sprain of anterior talofibular ligament of right ankle 06/16/2017  . Lumbar radiculopathy 04/21/2017  . Lumbar degenerative disc disease 04/21/2017  . Chronic pain syndrome 04/21/2017  . Spinal stenosis, lumbar region, with neurogenic claudication 04/21/2017  . SS-A antibody positive 03/05/2017  . Pain, lower extremity 08/03/2015  . DOE (dyspnea on exertion) 12/28/2013    Past Surgical History:  Procedure Laterality Date  . ANKLE SURGERY    . CARPAL TUNNEL RELEASE     x3  . COLONOSCOPY  2000?  . COLONOSCOPY WITH PROPOFOL N/A 10/22/2017   Procedure: COLONOSCOPY WITH PROPOFOL;  Surgeon: Lucilla Lame, MD;  Location: [place];  Service: Endoscopy;  Laterality: N/A;  . ESOPHAGOGASTRODUODENOSCOPY (EGD) WITH PROPOFOL N/A 10/22/2017   Procedure: ESOPHAGOGASTRODUODENOSCOPY (EGD) WITH PROPOFOL;  Surgeon: Lucilla Lame, MD;  Location: [place];  Service: Endoscopy;  Laterality: N/A;  .  FUSION OF TALONAVICULAR JOINT Right 08/03/2015   Procedure: TAILOR NAVICULAR JOINT FUSION - RIGHT ;  Surgeon: [name], DPM;  Location: [facility];  Service: Podiatry;  Laterality: Right;  . JOINT REPLACEMENT     knee x 3 ,right x1 and left x2  . POLYPECTOMY N/A 10/22/2017   Procedure: POLYPECTOMY;  Surgeon: Lucilla Lame, MD;  Location: [place];  Service: Endoscopy;  Laterality: N/A;  . ROTATOR CUFF REPAIR    . TONSILLECTOMY    . TOTAL KNEE REVISION Right  01/20/2018   Procedure: POLYETHYLENE EXCHANGE;  Surgeon: Dereck Leep, MD;  Location: [facility];  Service: Orthopedics;  Laterality: Right;  . TUBAL LIGATION    . UPPER GI ENDOSCOPY  2000?    Prior to Admission medications   Medication Sig Start Date End Date Taking? Authorizing Provider  albuterol (PROVENTIL HFA;VENTOLIN HFA) 108 (90 Base) MCG/ACT inhaler Inhale 2 puffs into the lungs every 4 (four) hours as needed for wheezing. 10/01/17   Juline Patch, MD  allopurinol (ZYLOPRIM) 100 MG tablet Take 2 tablets by mouth daily. Dr Barb Merino 08/06/17   [provider]  Cholecalciferol (VITAMIN D3) 2000 units TABS Take 2,000 Units by mouth daily.    [provider]  clotrimazole (LOTRIMIN) 1 % cream Apply to affected area 2 times daily 04/05/18   Thersa Salt G, DO  diclofenac sodium (VOLTAREN) 1 % GEL Apply 2 g topically 3 (three) times daily. [facility] 08/11/17   [provider]  ferrous sulfate 325 (65 FE) MG tablet Take 325 mg by mouth daily with breakfast. otc    [provider]  fluticasone (FLONASE) 50 MCG/ACT nasal spray SHAKE LIQUID AND USE 1 SPRAY IN EACH NOSTRIL DAILY 12/28/17   Juline Patch, MD  gabapentin (NEURONTIN) 300 MG capsule Take 2 capsules (600 mg total) by mouth at bedtime. Patient taking differently: Take 600 mg by mouth at bedtime. Pain center 01/28/18   Gillis Santa, MD  hydrALAZINE (APRESOLINE) 50 MG tablet Take 1 tablet (50 mg total) by mouth 2 (two) times daily. 07/14/18   Juline Patch, MD  HYDROcodone-acetaminophen (NORCO) 7.5-325 MG tablet Take 1 tablet by mouth every 8 (eight) hours as needed for up to 30 days for severe pain. Must last 30 days. 08/03/18 09/02/18  Gillis Santa, MD  levofloxacin (LEVAQUIN) 750 MG tablet Take 1 tablet (750 mg total) by mouth daily for 4 days. 08/09/18 08/13/18  Lannie Fields, PA-C  levothyroxine (SYNTHROID) 200 MCG tablet TAKE 1 TABLET(200 MCG) BY MOUTH DAILY BEFORE BREAKFAST 07/14/18   Juline Patch, MD  levothyroxine (SYNTHROID) 25 MCG tablet Take 1 tablet (25 mcg total) by mouth daily before breakfast. 07/15/18   Juline Patch, MD  meclizine (ANTIVERT) 12.5 MG tablet Take 1 tablet (12.5 mg total) by mouth 2 (two) times daily as needed for dizziness. 09/14/17   Juline Patch, MD  Multiple Vitamins-Calcium (ONE-A-DAY WOMENS FORMULA PO) Take 1 tablet by mouth daily.    [provider]  omega-3 acid ethyl esters (LOVAZA) 1 g capsule Take 1 capsule (1 g total) by mouth 2 (two) times daily. 07/14/18   Juline Patch, MD  omeprazole (PRILOSEC) 40 MG capsule TAKE 1 CAPSULE(40 MG) BY MOUTH DAILY 07/14/18   Juline Patch, MD  Polyethyl Glycol-Propyl Glycol (SYSTANE OP) Place 1 drop into both eyes daily as needed (dry eyes).     [provider]  potassium chloride SA (K-DUR) 20 MEQ tablet  Take 0.5 tablets (10 mEq total) by mouth daily. 07/14/18   Juline Patch, MD  Propylene Glycol (SYSTANE BALANCE OP) Apply 1 drop to eye 2 (two) times daily.    [provider]  ranitidine (ZANTAC) 150 MG tablet TAKE 1 TABLET(150 MG) BY MOUTH TWICE DAILY 03/29/18   Juline Patch, MD  tiZANidine (ZANAFLEX) 4 MG capsule Take 1 capsule (4 mg total) by mouth 2 (two) times daily as needed. 07/29/18   Gillis Santa, MD  triamcinolone cream (KENALOG) 0.1 % Apply 1 application topically every morning.    [provider]  triamterene-hydrochlorothiazide (MAXZIDE) 75-50 MG tablet Take 1 tablet by mouth daily. 07/14/18   Juline Patch, MD    Allergies Ampicillin, Penicillins, and Vibramycin [doxycycline calcium]  Family History  Problem Relation Age of Onset  . COPD Sister   . Cancer Maternal Aunt        breast  . Cancer Maternal Uncle        kidney    Social History Social History   Tobacco Use  . Smoking status: Former Smoker    Quit date: 02/24/1993    Years since quitting: 25.4  . Smokeless tobacco: Never Used  Substance Use Topics  . Alcohol use: No     Alcohol/week: 0.0 standard drinks  . Drug use: No     Review of Systems  Constitutional: No fever/chills Eyes: No visual changes. No discharge ENT: No upper respiratory complaints. Cardiovascular: no chest pain. Respiratory: no cough. No SOB. Gastrointestinal: No abdominal pain.  No nausea, no vomiting.  No diarrhea.  No constipation. Genitourinary: Patient has dysuria and increased urinary frequency.  Musculoskeletal: Negative for musculoskeletal pain. Skin: Negative for rash, abrasions, lacerations, ecchymosis. Neurological: Negative for headaches, focal weakness or numbness.   ____________________________________________   PHYSICAL EXAM:  VITAL SIGNS: ED Triage Vitals [08/09/18 2105]  Enc Vitals Group     BP (!) 142/98     Pulse Rate 63     Resp 18     Temp 98.7 F (37.1 C)     Temp Source Oral     SpO2 100 %     Weight 254 lb (115.2 kg)     Height 5\' 11"  (1.803 m)     Head Circumference      Peak Flow      Pain Score      Pain Loc      Pain Edu?      Excl. in [place]?      Constitutional: Alert and oriented. Well appearing and in no acute distress. Eyes: Conjunctivae are normal. PERRL. EOMI. Head: Atraumatic. Cardiovascular: Normal rate, regular rhythm. Normal S1 and S2.  Good peripheral circulation. Respiratory: Normal respiratory effort without tachypnea or retractions. Lungs CTAB. Good air entry to the bases with no decreased or absent breath sounds. Gastrointestinal: Bowel sounds 4 quadrants. Patient has suprapubic tenderness to palpation. No guarding or rigidity. No palpable masses. No distention. No CVA tenderness. Musculoskeletal: Full range of motion to all extremities. No gross deformities appreciated. Neurologic:  Normal speech and language. No gross focal neurologic deficits are appreciated.  Skin:  Skin is warm, dry and intact. No rash noted. Psychiatric: Mood and affect are normal. Speech and behavior are normal. Patient exhibits appropriate insight  and judgement.   ____________________________________________   LABS (all labs ordered are listed, but only abnormal results are displayed)  Labs Reviewed  URINALYSIS, COMPLETE (UACMP) WITH MICROSCOPIC - Abnormal; Notable for the following components:  Result Value   Color, Urine STRAW (*)    APPearance HAZY (*)    Leukocytes,Ua LARGE (*)    All other components within normal limits   ____________________________________________  EKG   ____________________________________________  RADIOLOGY   No results found.  ____________________________________________    PROCEDURES  Procedure(s) performed:    Procedures    Medications  levofloxacin (LEVAQUIN) tablet 750 mg (750 mg Oral Given 08/09/18 2322)     ____________________________________________   INITIAL IMPRESSION / ASSESSMENT AND PLAN / ED COURSE  Pertinent labs & imaging results that were available during my care of the patient were reviewed by me and considered in my medical decision making (see chart for details).  Review of the [place] CSRS was performed in accordance of the [place] prior to dispensing any controlled drugs.        Assessment and plan Cystitis Patient presents to the emergency department with dysuria and increased urinary frequency with low back pain for the past 3 days.  On physical exam, patient had some suprapubic tenderness to palpation but no CVA tenderness.  No tachycardia or fever on physical exam.  Patient appeared to be resting comfortably.    Differential diagnosis included cystitis, pyelonephritis, nephrolithiasis...  Urinalysis was concerning for cystitis with a large amount of leukocytes.  Patient had no CVA tenderness, fever, nausea or vomiting that would suggest pyelonephritis.  No blood on urinalysis that would suggest nephrolithiasis.  Patient states that she has had a kidney stone in the past and her symptoms do not feel similar.  Patient has penicillin allergy.   Patient was given Levaquin in the emergency department.  She was discharged with Levaquin.  Patient was given strict return precautions to return to the emergency department in 2 days if symptoms worsen.  All patient questions were answered.       ____________________________________________  FINAL CLINICAL IMPRESSION(S) / ED DIAGNOSES  Final diagnoses:  Cystitis      NEW MEDICATIONS STARTED DURING THIS VISIT:  ED Discharge Orders         Ordered    levofloxacin (LEVAQUIN) 750 MG tablet  Daily     08/09/18 2301              This chart was dictated using voice recognition software/Dragon. Despite best efforts to proofread, errors can occur which can change the meaning. Any change was purely unintentional.    Lannie Fields, PA-C 08/09/18 2337    Harvest Dark, MD 08/10/18 2351

## 2018-08-09 NOTE — Discharge Instructions (Signed)
Return to the emergency department if symptoms worsen or do not improve.  You have been given Levaquin in the emergency department. You will take Levaquin at home for the next four days.

## 2018-08-09 NOTE — ED Triage Notes (Addendum)
Pt presents to ED with urinary frequency and painful urination for the past 3 days. Symptoms have gotten progressively worse. Denies fever or vomiting.

## 2018-08-18 ENCOUNTER — Other Ambulatory Visit: Payer: Self-pay | Admitting: Student in an Organized Health Care Education/Training Program

## 2018-08-18 ENCOUNTER — Other Ambulatory Visit: Admission: RE | Admit: 2018-08-18 | Payer: Medicare Other | Source: Ambulatory Visit

## 2018-08-19 ENCOUNTER — Ambulatory Visit (HOSPITAL_BASED_OUTPATIENT_CLINIC_OR_DEPARTMENT_OTHER): Payer: Medicare Other | Admitting: Student in an Organized Health Care Education/Training Program

## 2018-08-19 ENCOUNTER — Encounter: Payer: Self-pay | Admitting: Student in an Organized Health Care Education/Training Program

## 2018-08-19 ENCOUNTER — Other Ambulatory Visit
Admission: RE | Admit: 2018-08-19 | Discharge: 2018-08-19 | Disposition: A | Discharge status: Home / Self Care | Payer: Medicare Other | Source: Ambulatory Visit | Attending: Student in an Organized Health Care Education/Training Program | Admitting: Student in an Organized Health Care Education/Training Program

## 2018-08-19 ENCOUNTER — Other Ambulatory Visit: Payer: Self-pay

## 2018-08-19 DIAGNOSIS — G894 Chronic pain syndrome: Secondary | ICD-10-CM

## 2018-08-19 DIAGNOSIS — M47816 Spondylosis without myelopathy or radiculopathy, lumbar region: Secondary | ICD-10-CM | POA: Diagnosis not present

## 2018-08-19 DIAGNOSIS — M5416 Radiculopathy, lumbar region: Secondary | ICD-10-CM

## 2018-08-19 DIAGNOSIS — Z1159 Encounter for screening for other viral diseases: Secondary | ICD-10-CM | POA: Diagnosis not present

## 2018-08-19 DIAGNOSIS — M5136 Other intervertebral disc degeneration, lumbar region: Secondary | ICD-10-CM

## 2018-08-19 DIAGNOSIS — M47812 Spondylosis without myelopathy or radiculopathy, cervical region: Secondary | ICD-10-CM

## 2018-08-19 MED ORDER — HYDROCODONE-ACETAMINOPHEN 7.5-325 MG PO TABS: 1.0000 | ORAL_TABLET | Freq: Three times a day (TID) | ORAL | 0 refills | Status: DC | PRN

## 2018-08-19 MED ORDER — GABAPENTIN 600 MG PO TABS: 600.0000 mg | ORAL_TABLET | Freq: Every day | ORAL | 2 refills | Status: DC

## 2018-08-19 NOTE — Progress Notes (Signed)
Pain Management Virtual Encounter Note - Virtual Visit via Telephone Telehealth (real-time audio visits between healthcare provider and patient).   Patient's Phone No. & Preferred Pharmacy:  [phone] (home); There is no such number on file (mobile).; (Preferred) [phone] No e-mail address on record  [facility], [place] [address] [place] [address] Phone: [phone] Fax: [phone]    Pre-screening note:  Our staff contacted Ms. Pecore and offered her an "in person", "face-to-face" appointment versus a telephone encounter. She indicated preferring the telephone encounter, at this time.   Reason for Virtual Visit: COVID-19*  Social distancing based on CDC and AMA recommendations.   I contacted [name] on 08/19/2018 via telephone.      I clearly identified myself as Gillis Santa, MD. I verified that I was speaking with the correct person using two identifiers (Name: MARLENE BEIDLER, and date of birth: [birth year]).  Advanced Informed Consent I sought verbal advanced consent from [name] for virtual visit interactions. I informed Ms. Cranmer of possible security and privacy concerns, risks, and limitations associated with providing "not-in-person" medical evaluation and management services. I also informed Ms. Woollard of the availability of "in-person" appointments. Finally, I informed her that there would be a charge for the virtual visit and that she could be  personally, fully or partially, financially responsible for it. Ms. Feuerstein expressed understanding and agreed to proceed.   Historic Elements   Ms. CLOTEAL ISAACSON is a 68 y.o. year old, female patient evaluated today after her last encounter by our practice on 08/18/2018. Ms. Schreiter  has a past medical history of Allergy, Anemia, Arthritis, Asthma, Chronic kidney disease, Collagen vascular disease ([place]), Family history of adverse reaction to anesthesia,  GERD (gastroesophageal reflux disease), H/O tooth extraction, Hemorrhoid, History of hiatal hernia, Hypertension, Hypothyroidism, Migraines, Mixed hyperlipidemia, Multiple gastric ulcers, Thyroid disease, and Wears dentures. She also  has a past surgical history that includes Carpal tunnel release; Rotator cuff repair; Ankle surgery; Tubal ligation; Colonoscopy (2000?); Upper gi endoscopy (2000?); Tonsillectomy; Fusion of talonavicular joint (Right, 08/03/2015); Colonoscopy with propofol (N/A, 10/22/2017); Esophagogastroduodenoscopy (egd) with propofol (N/A, 10/22/2017); polypectomy (N/A, 10/22/2017); Joint replacement; and Total knee revision (Right, 01/20/2018). Ms. Paddock has a current medication list which includes the following prescription(s): albuterol, allopurinol, vitamin d3, clotrimazole, diclofenac sodium, ferrous sulfate, fluticasone, gabapentin, hydralazine, hydrocodone-acetaminophen, hydrocodone-acetaminophen, hydrocodone-acetaminophen, levothyroxine, levothyroxine, meclizine, multiple vitamins-calcium, omega-3 acid ethyl esters, omeprazole, polyethyl glycol-propyl glycol, potassium chloride sa, propylene glycol, ranitidine, tizanidine, triamcinolone cream, and triamterene-hydrochlorothiazide. She  reports that she quit smoking about 25 years ago. She has never used smokeless tobacco. She reports that she does not drink alcohol or use drugs. Ms. Kunin is allergic to ampicillin; penicillins; and vibramycin [doxycycline calcium].   HPI  Today, she is being contacted for medication management.   Encounter for medication management. No change in medical history since last visit.  Patient continues multimodal pain regimen as prescribed.  States that it provides pain relief and improvement in functional status.  Scheduled for L-ESI Monday for increased back and leg pain secondary to chronic lumbar radiculopathy.  Pharmacotherapy Assessment   08/03/2018  2   05/27/2018  Hydrocodone-Acetamin 7.5-325  90.00  30 Bi Lat   [id number]   Wal (4612)   0  22.50 MME  Comm Ins   [place]    Monitoring: Pharmacotherapy: No side-effects or adverse reactions reported. [place] PMP: PDMP reviewed  during this encounter.       Compliance: No problems identified. Effectiveness: Clinically acceptable. Plan: Refer to "POC".  Pertinent Labs   SAFETY SCREENING Profile Lab Results  Component Value Date   STAPHAUREUS NEGATIVE 11/05/2017   MRSAPCR NEGATIVE 11/05/2017   HIV Non Reactive 08/25/2017   Renal Function Lab Results  Component Value Date   BUN 26 07/14/2018   CREATININE 1.69 (H) 07/14/2018   BCR 15 07/14/2018   GFRAA 35 (L) 07/14/2018   GFRNONAA 31 (L) 07/14/2018   Hepatic Function Lab Results  Component Value Date   AST 24 07/14/2018   ALT 21 07/14/2018   ALBUMIN 4.4 07/14/2018   UDS Summary  Date Value Ref Range Status  03/30/2018 FINAL  Final    Comment:    ==================================================================== TOXASSURE SELECT 13 (MW) ==================================================================== Test                             Result       Flag       Units Drug Present and Declared for Prescription Verification   Hydrocodone                    824          EXPECTED   ng/mg creat   Hydromorphone                  233          EXPECTED   ng/mg creat   Dihydrocodeine                 100          EXPECTED   ng/mg creat   Norhydrocodone                 545          EXPECTED   ng/mg creat    Sources of hydrocodone include scheduled prescription    medications. Hydromorphone, dihydrocodeine and norhydrocodone are    expected metabolites of hydrocodone. Hydromorphone and    dihydrocodeine are also available as scheduled prescription    medications. ==================================================================== Test                      Result    Flag   Units      Ref Range   Creatinine              127              mg/dL       >=20 ==================================================================== Declared Medications:  The flagging and interpretation on this report are based on the  following declared medications.  Unexpected results may arise from  inaccuracies in the declared medications.  **Note: The testing scope of this panel includes these medications:  Hydrocodone (Norco)  **Note: The testing scope of this panel does not include following  reported medications:  Acetaminophen (Norco)  Albuterol  Allopurinol  Calcium  Cholecalciferol  Diclofenac  Fluticasone  Gabapentin  Hydralazine  Hydrochlorothiazide (Maxzide)  Iron (Ferrous Sulfate)  Ketoconazole (Nizoral)  Levothyroxine (Synthroid)  Meclizine (Antivert)  Multivitamin  Nystatin (Mycostatin)  Omega-3 Fatty Acids (Lovaza)  Omeprazole (Prilosec)  Polyethylene Glycol  Potassium (K-Dur)  Ranitidine (Zantac)  Tizanidine (Zanaflex)  Triamcinolone (Kenalog)  Triamterene (Maxzide)  Undefined Miscellaneous Drug ==================================================================== For clinical consultation, please call [phone]. ====================================================================    Note: Above Lab results reviewed.  Recent imaging  DG Knee Right [place] CLINICAL DATA:  Right total knee arthroplasty poly ethylene exchange.  EXAM: PORTABLE RIGHT KNEE - 1-2 VIEW  COMPARISON:  CT of the right knee 11/16/2015. Right knee radiographs 11/16/2015  FINDINGS: Right total knee arthroplasty is noted. New disc spacer is in place. There is gas and fluid in the joint. The knee is located. Femoral and tibial components are well seated.  IMPRESSION: Revision of right knee arthroplasty without radiographic evidence for complication.  Electronically Signed   By: San Morelle M.D.   On: 01/20/2018 11:00  Assessment  The primary encounter diagnosis was Lumbar radiculopathy. Diagnoses of Lumbar degenerative disc  disease, Cervical spondylosis without myelopathy, Chronic pain syndrome, and Lumbar spondylosis were also pertinent to this visit.  Plan of Care  I have discontinued Kellsey L. Goings's gabapentin. I am also having her start on HYDROcodone-acetaminophen, HYDROcodone-acetaminophen, and gabapentin. Additionally, I am having her maintain her Polyethyl Glycol-Propyl Glycol (SYSTANE OP), ferrous sulfate, Multiple Vitamins-Calcium (ONE-A-DAY WOMENS FORMULA PO), allopurinol, diclofenac sodium, Vitamin D3, meclizine, albuterol, triamcinolone cream, Propylene Glycol (SYSTANE BALANCE OP), fluticasone, ranitidine, clotrimazole, hydrALAZINE, omega-3 acid ethyl esters, omeprazole, potassium chloride SA, levothyroxine, triamterene-hydrochlorothiazide, levothyroxine, tiZANidine, and HYDROcodone-acetaminophen.  Pharmacotherapy (Medications Ordered): Meds ordered this encounter  Medications  . HYDROcodone-acetaminophen (NORCO) 7.5-325 MG tablet    Sig: Take 1 tablet by mouth every 8 (eight) hours as needed for up to 30 days for severe pain. Must last 30 days.    Dispense:  90 tablet    Refill:  0    [place] STOP ACT - Not applicable. Fill one day early if pharmacy is closed on scheduled refill date.  Marland Kitchen HYDROcodone-acetaminophen (NORCO) 7.5-325 MG tablet    Sig: Take 1 tablet by mouth every 8 (eight) hours as needed for up to 30 days for severe pain. Must last 30 days.    Dispense:  90 tablet    Refill:  0    [place] STOP ACT - Not applicable. Fill one day early if pharmacy is closed on scheduled refill date.  Marland Kitchen HYDROcodone-acetaminophen (NORCO) 7.5-325 MG tablet    Sig: Take 1 tablet by mouth every 8 (eight) hours as needed for up to 30 days for severe pain. Must last 30 days.    Dispense:  90 tablet    Refill:  0    [place] STOP ACT - Not applicable. Fill one day early if pharmacy is closed on scheduled refill date.  . gabapentin (NEURONTIN) 600 MG tablet    Sig: Take 1 tablet (600 mg total) by mouth at bedtime.    Dispense:   30 tablet    Refill:  2   Orders:  No orders of the defined types were placed in this encounter.  Follow-up plan:   Return in about 3 months (around 11/19/2018) for Medication Management.    Recent Visits Date Type Provider Dept  05/27/18 Office Visit Gillis Santa, MD [facility]  Showing recent visits within past 90 days and meeting all other requirements   Today's Visits Date Type Provider Dept  08/19/18 Office Visit Gillis Santa, MD [facility]  Showing today's visits and meeting all other requirements   Future Appointments Date Type Provider Dept  08/23/18 Appointment Gillis Santa, MD [facility]  Showing future appointments within next 90 days and meeting all other requirements   I discussed the assessment and treatment plan with the patient. The patient was provided an opportunity to ask questions and all were answered. The patient agreed with  the plan and demonstrated an understanding of the instructions.  Patient advised to call back or seek an in-person evaluation if the symptoms or condition worsens.  Total duration of non-face-to-face encounter: 25 minutes.  Note by: Gillis Santa, MD Date: 08/19/2018; Time: 10:00 AM  Note: This dictation was prepared with Dragon dictation. Any transcriptional errors that may result from this process are unintentional.  Disclaimer:  * Given the special circumstances of the COVID-19 pandemic, the federal government has announced that the Office for Civil Rights (OCR) will exercise its enforcement discretion and will not impose penalties on physicians using telehealth in the event of noncompliance with regulatory requirements under the [place] and [place] (HIPAA) in connection with the good faith provision of telehealth during the FVWAQ-77 national public health emergency. ([place])

## 2018-08-20 LAB — NOVEL CORONAVIRUS, NAA (HOSP ORDER, SEND-OUT TO REF LAB; TAT 18-24 HRS): SARS-CoV-2, NAA: NOT DETECTED

## 2018-08-21 ENCOUNTER — Other Ambulatory Visit: Payer: Self-pay | Admitting: Family Medicine

## 2018-08-23 ENCOUNTER — Ambulatory Visit
Admission: RE | Admit: 2018-08-23 | Discharge: 2018-08-23 | Disposition: A | Discharge status: Home / Self Care | Payer: Medicare Other | Source: Ambulatory Visit | Attending: Student in an Organized Health Care Education/Training Program | Admitting: Student in an Organized Health Care Education/Training Program

## 2018-08-23 ENCOUNTER — Other Ambulatory Visit: Payer: Self-pay

## 2018-08-23 ENCOUNTER — Encounter: Payer: Self-pay | Admitting: Student in an Organized Health Care Education/Training Program

## 2018-08-23 ENCOUNTER — Ambulatory Visit: Payer: Medicare Other | Admitting: Student in an Organized Health Care Education/Training Program

## 2018-08-23 ENCOUNTER — Ambulatory Visit
Payer: Medicare Other | Attending: Student in an Organized Health Care Education/Training Program | Admitting: Student in an Organized Health Care Education/Training Program

## 2018-08-23 VITALS — BP 131/74 | HR 62 | Temp 98.6°F | Resp 13 | Ht 71.0 in | Wt 254.0 lb

## 2018-08-23 DIAGNOSIS — M19012 Primary osteoarthritis, left shoulder: Secondary | ICD-10-CM

## 2018-08-23 DIAGNOSIS — M5416 Radiculopathy, lumbar region: Secondary | ICD-10-CM

## 2018-08-23 MED ORDER — DEXAMETHASONE SODIUM PHOSPHATE 10 MG/ML IJ SOLN
INTRAMUSCULAR | Status: AC
  Filled 2018-08-23: qty 1

## 2018-08-23 MED ORDER — ROPIVACAINE HCL 2 MG/ML IJ SOLN: 2.0000 mL | Freq: Once | INTRAMUSCULAR | Status: DC

## 2018-08-23 MED ORDER — DEXAMETHASONE SODIUM PHOSPHATE 10 MG/ML IJ SOLN
10.0000 mg | Freq: Once | INTRAMUSCULAR | Status: AC
  Administered 2018-08-23: 10:00:00 10 mg

## 2018-08-23 MED ORDER — SODIUM CHLORIDE (PF) 0.9 % IJ SOLN
INTRAMUSCULAR | Status: AC
  Filled 2018-08-23: qty 10

## 2018-08-23 MED ORDER — DIAZEPAM 5 MG PO TABS
ORAL_TABLET | ORAL | Status: AC
  Filled 2018-08-23: qty 1

## 2018-08-23 MED ORDER — IOHEXOL 180 MG/ML  SOLN: 10.0000 mL | Freq: Once | INTRAMUSCULAR | Status: DC

## 2018-08-23 MED ORDER — ROPIVACAINE HCL 2 MG/ML IJ SOLN
INTRAMUSCULAR | Status: AC
  Filled 2018-08-23: qty 10

## 2018-08-23 MED ORDER — SODIUM CHLORIDE 0.9% FLUSH
2.0000 mL | Freq: Once | INTRAVENOUS | Status: AC
  Administered 2018-08-23: 2 mL

## 2018-08-23 MED ORDER — DIAZEPAM 5 MG PO TABS
2.5000 mg | ORAL_TABLET | Freq: Once | ORAL | Status: AC
  Administered 2018-08-23: 2.5 mg via ORAL

## 2018-08-23 MED ORDER — LIDOCAINE HCL 2 % IJ SOLN
INTRAMUSCULAR | Status: AC
  Filled 2018-08-23: qty 20

## 2018-08-23 MED ORDER — FENTANYL CITRATE (PF) 100 MCG/2ML IJ SOLN: 25.0000 ug | INTRAMUSCULAR | Status: DC | PRN

## 2018-08-23 MED ORDER — LIDOCAINE HCL 2 % IJ SOLN
20.0000 mL | Freq: Once | INTRAMUSCULAR | Status: AC
  Administered 2018-08-23: 400 mg

## 2018-08-23 NOTE — Progress Notes (Signed)
Safety precautions to be maintained throughout the outpatient stay will include: orient to surroundings, keep bed in low position, maintain call bell within reach at all times, provide assistance with transfer out of bed and ambulation.  

## 2018-08-23 NOTE — Progress Notes (Signed)
Patient's Name: Michele Moore  MRN: [id number]  Referring Provider: Juline Patch, MD  DOB: 1950/11/10  PCP: Juline Patch, MD  DOS: 08/23/2018  Note by: Gillis Santa, MD  Service setting: Ambulatory outpatient  Specialty: Interventional Pain Management  Patient type: Established  Location: [facility] (AMB) Pain Management Facility  Visit type: Interventional Procedure   Primary Reason for Visit: Interventional Pain Management Treatment. CC: Back Pain (lower), Knee Pain (bilateral), and Shoulder Pain (left)  Procedure:          Anesthesia, Analgesia, Anxiolysis:  Type: Therapeutic Inter-Laminar Epidural Steroid Injection #3  Region: Lumbar Level: L4-5 Level. Laterality: Left-Sided         Type: Moderate (Conscious) Sedation combined with Local Anesthesia Indication(s): Analgesia and Anxiety Route: Intravenous (IV) IV Access: Secured Sedation: Meaningful verbal contact was maintained at all times during the procedure  Local Anesthetic: Lidocaine 1-2%   Indications: 1. Lumbar radiculopathy   2. Primary osteoarthritis of left shoulder    Pain Score: Pre-procedure: 8 /10 Post-procedure: 5 /10  Pre-op Assessment:  Michele Moore is a 68 y.o. (year old), female patient, seen today for interventional treatment. She  has a past surgical history that includes Carpal tunnel release; Rotator cuff repair; Ankle surgery; Tubal ligation; Colonoscopy (2000?); Upper gi endoscopy (2000?); Tonsillectomy; Fusion of talonavicular joint (Right, 08/03/2015); Colonoscopy with propofol (N/A, 10/22/2017); Esophagogastroduodenoscopy (egd) with propofol (N/A, 10/22/2017); polypectomy (N/A, 10/22/2017); Joint replacement; and Total knee revision (Right, 01/20/2018). Michele Moore has a current medication list which includes the following prescription(s): albuterol, allopurinol, vitamin d3, clotrimazole, diclofenac sodium, ferrous sulfate, fluticasone, gabapentin, hydralazine, hydrocodone-acetaminophen, hydrocodone-acetaminophen,  hydrocodone-acetaminophen, levothyroxine, levothyroxine, meclizine, multiple vitamins-calcium, omega-3 acid ethyl esters, omeprazole, polyethyl glycol-propyl glycol, potassium chloride sa, propylene glycol, tizanidine, triamcinolone cream, triamterene-hydrochlorothiazide, and ranitidine, and the following Facility-Administered Medications: fentanyl, iohexol, and ropivacaine (pf) 2 mg/ml (0.2%). Her primarily concern today is the Back Pain (lower), Knee Pain (bilateral), and Shoulder Pain (left)  Initial Vital Signs:  Pulse/HCG Rate: 62ECG Heart Rate: 85 Temp: 98.6 F (37 C) Resp: 16 BP: 138/74 SpO2: 98 %  BMI: Estimated body mass index is 35.43 kg/m as calculated from the following:   Height as of this encounter: 5\' 11"  (1.803 m).   Weight as of this encounter: 254 lb (115.2 kg).  Risk Assessment: Allergies: Reviewed. She is allergic to ampicillin; penicillins; and vibramycin [doxycycline calcium].  Allergy Precautions: None required Coagulopathies: Reviewed. None identified.  Blood-thinner therapy: None at this time Active Infection(s): Reviewed. None identified. Michele Moore is afebrile  Site Confirmation: Michele Moore was asked to confirm the procedure and laterality before marking the site Procedure checklist: Completed Consent: Before the procedure and under the influence of no sedative(s), amnesic(s), or anxiolytics, the patient was informed of the treatment options, risks and possible complications. To fulfill our ethical and legal obligations, as recommended by the American Medical Association's Code of Ethics, I have informed the patient of my clinical impression; the nature and purpose of the treatment or procedure; the risks, benefits, and possible complications of the intervention; the alternatives, including doing nothing; the risk(s) and benefit(s) of the alternative treatment(s) or procedure(s); and the risk(s) and benefit(s) of doing nothing. The patient was provided information  about the general risks and possible complications associated with the procedure. These may include, but are not limited to: failure to achieve desired goals, infection, bleeding, organ or nerve damage, allergic reactions, paralysis, and death. In addition, the patient was informed of those risks and complications associated to Spine-related procedures,  such as failure to decrease pain; infection (i.e.: Meningitis, epidural or intraspinal abscess); bleeding (i.e.: epidural hematoma, subarachnoid hemorrhage, or any other type of intraspinal or peri-dural bleeding); organ or nerve damage (i.e.: Any type of peripheral nerve, nerve root, or spinal cord injury) with subsequent damage to sensory, motor, and/or autonomic systems, resulting in permanent pain, numbness, and/or weakness of one or several areas of the body; allergic reactions; (i.e.: anaphylactic reaction); and/or death. Furthermore, the patient was informed of those risks and complications associated with the medications. These include, but are not limited to: allergic reactions (i.e.: anaphylactic or anaphylactoid reaction(s)); adrenal axis suppression; blood sugar elevation that in diabetics may result in ketoacidosis or comma; water retention that in patients with history of congestive heart failure may result in shortness of breath, pulmonary edema, and decompensation with resultant heart failure; weight gain; swelling or edema; medication-induced neural toxicity; particulate matter embolism and blood vessel occlusion with resultant organ, and/or nervous system infarction; and/or aseptic necrosis of one or more joints. Finally, the patient was informed that Medicine is not an exact science; therefore, there is also the possibility of unforeseen or unpredictable risks and/or possible complications that may result in a catastrophic outcome. The patient indicated having understood very clearly. We have given the patient no guarantees and we have made no  promises. Enough time was given to the patient to ask questions, all of which were answered to the patient's satisfaction. Michele Moore has indicated that she wanted to continue with the procedure. Attestation: I, the ordering provider, attest that I have discussed with the patient the benefits, risks, side-effects, alternatives, likelihood of achieving goals, and potential problems during recovery for the procedure that I have provided informed consent. Date  Time:   Pre-Procedure Preparation:  Monitoring: As per clinic protocol. Respiration, ETCO2, SpO2, BP, heart rate and rhythm monitor placed and checked for adequate function Safety Precautions: Patient was assessed for positional comfort and pressure points before starting the procedure. Time-out: I initiated and conducted the "Time-out" before starting the procedure, as per protocol. The patient was asked to participate by confirming the accuracy of the "Time Out" information. Verification of the correct person, site, and procedure were performed and confirmed by me, the nursing staff, and the patient. "Time-out" conducted as per Joint Commission's Universal Protocol (UP.01.01.01). Time: 0934  Description of Procedure:          Position: Prone with head of the table was raised to facilitate breathing. Target Area: The interlaminar space, initially targeting the lower laminar border of the superior vertebral body. Approach: Paramedial approach. Area Prepped: Entire Posterior Lumbar Region Prepping solution: ChloraPrep (2% chlorhexidine gluconate and 70% isopropyl alcohol) Safety Precautions: Aspiration looking for blood return was conducted prior to all injections. At no point did we inject any substances, as a needle was being advanced. No attempts were made at seeking any paresthesias. Safe injection practices and needle disposal techniques used. Medications properly checked for expiration dates. SDV (single dose vial) medications  used. Description of the Procedure: Protocol guidelines were followed. The procedure needle was introduced through the skin, ipsilateral to the reported pain, and advanced to the target area. Bone was contacted and the needle walked caudad, until the lamina was cleared. The epidural space was identified using "loss-of-resistance technique" with 2-3 ml of PF-NaCl (0.9% NSS), in a 5cc LOR glass syringe. Vitals:   08/23/18 0834 08/23/18 0928 08/23/18 0934 08/23/18 0939  BP: 138/74 128/83 130/67 131/74  Pulse: 62     Resp: 16  11 14 13   Temp: 98.6 F (37 C)     TempSrc: Oral     SpO2: 98% 100% 100% 99%  Weight: 254 lb (115.2 kg)     Height: 5\' 11"  (1.803 m)       Start Time: 0934 hrs. End Time: 0939 hrs. Materials:  Needle(s) Type: Epidural needle Gauge: 17G Length: 3.5-in Medication(s): Please see orders for medications and dosing details. 8 CC solution made of 5 cc of preservative-free saline, 1 cc of Decadron 10 mg/cc, 2 cc of 0.2% ropivacaine. Imaging Guidance (Spinal):          Type of Imaging Technique: Fluoroscopy Guidance (Spinal) Indication(s): Assistance in needle guidance and placement for procedures requiring needle placement in or near specific anatomical locations not easily accessible without such assistance. Exposure Time: Please see nurses notes. Contrast: Before injecting any contrast, we confirmed that the patient did not have an allergy to iodine, shellfish, or radiological contrast. Once satisfactory needle placement was completed at the desired level, radiological contrast was injected. Contrast injected under live fluoroscopy. No contrast complications. See chart for type and volume of contrast used. Fluoroscopic Guidance: I was personally present during the use of fluoroscopy. "Tunnel Vision Technique" used to obtain the best possible view of the target area. Parallax error corrected before commencing the procedure. "Direction-depth-direction" technique used to introduce  the needle under continuous pulsed fluoroscopy. Once target was reached, antero-posterior, oblique, and lateral fluoroscopic projection used confirm needle placement in all planes. Images permanently stored in EMR. Interpretation: I personally interpreted the imaging intraoperatively. Adequate needle placement confirmed in multiple planes. Appropriate spread of contrast into desired area was observed. No evidence of afferent or efferent intravascular uptake. No intrathecal or subarachnoid spread observed. Permanent images saved into the patient's record.  Antibiotic Prophylaxis:   Anti-infectives (From admission, onward)   None     Indication(s): None identified  Post-operative Assessment:  Post-procedure Vital Signs:  Pulse/HCG Rate: 62(!) 55 Temp: 98.6 F (37 C) Resp: 13 BP: 131/74 SpO2: 99 %  EBL: None  Complications: No immediate post-treatment complications observed by team, or reported by patient.  Note: The patient tolerated the entire procedure well. A repeat set of vitals were taken after the procedure and the patient was kept under observation following institutional policy, for this type of procedure. Post-procedural neurological assessment was performed, showing return to baseline, prior to discharge. The patient was provided with post-procedure discharge instructions, including a section on how to identify potential problems. Should any problems arise concerning this procedure, the patient was given instructions to immediately contact us, at any time, without hesitation. In any case, we plan to contact the patient by telephone for a follow-up status report regarding this interventional procedure.  Comments:  No additional relevant information.  Plan of Care    Imaging Orders     DG PAIN CLINIC C-ARM 1-60 MIN NO REPORT  Procedure Orders     SHOULDER INJECTION  Medications ordered for procedure: Meds ordered this encounter  Medications  . iohexol (OMNIPAQUE) 180  MG/ML injection 10 mL    Must be Myelogram-compatible. If not available, you may substitute with a water-soluble, non-ionic, hypoallergenic, myelogram-compatible radiological contrast medium.  Marland Kitchen lidocaine (XYLOCAINE) 2 % (with pres) injection 400 mg  . ropivacaine (PF) 2 mg/mL (0.2%) (NAROPIN) injection 2 mL  . dexamethasone (DECADRON) injection 10 mg  . sodium chloride flush (NS) 0.9 % injection 2 mL  . fentaNYL (SUBLIMAZE) injection 25-50 mcg    Make sure Narcan is  available in the pyxis when using this medication. In the event of respiratory depression (RR< 8/min): Titrate NARCAN (naloxone) in increments of 0.1 to 0.2 mg IV at 2-3 minute intervals, until desired degree of reversal.  . diazepam (VALIUM) tablet 2.5 mg   Medications administered: We administered lidocaine, dexamethasone, sodium chloride flush, and diazepam.  See the medical record for exact dosing, route, and time of administration.  New Prescriptions   No medications on file   Disposition: Discharge home  Discharge Date & Time: 08/23/2018; 0945 hrs.   Physician-requested Follow-up: Return for Procedure- left shoulder steroid injection with sedation.  Future Appointments  Date Time Provider [place]  09/06/2018  8:45 AM Gillis Santa, MD [facility] None  11/18/2018 10:45 AM Gillis Santa, MD [facility] None   Primary Care Physician: Juline Patch, MD Location: [facility] Note by: Gillis Santa, MD Date: 08/23/2018; Time: 9:59 AM  Disclaimer:  Medicine is not an exact science. The only guarantee in medicine is that nothing is guaranteed. It is important to note that the decision to proceed with this intervention was based on the information collected from the patient. The Data and conclusions were drawn from the patient's questionnaire, the interview, and the physical examination. Because the information was provided in large part by the patient, it cannot be guaranteed that it has  not been purposely or unconsciously manipulated. Every effort has been made to obtain as much relevant data as possible for this evaluation. It is important to note that the conclusions that lead to this procedure are derived in large part from the available data. Always take into account that the treatment will also be dependent on availability of resources and existing treatment guidelines, considered by other Pain Management Practitioners as being common knowledge and practice, at the time of the intervention. For Medico-Legal purposes, it is also important to point out that variation in procedural techniques and pharmacological choices are the acceptable norm. The indications, contraindications, technique, and results of the above procedure should only be interpreted and judged by a Board-Certified Interventional Pain Specialist with extensive familiarity and expertise in the same exact procedure and technique.

## 2018-08-24 ENCOUNTER — Telehealth: Payer: Self-pay

## 2018-08-24 NOTE — Telephone Encounter (Signed)
Talked with patient, States that she had a headache when she got home. Instructed to call if not better.

## 2018-08-26 DIAGNOSIS — L400 Psoriasis vulgaris: Secondary | ICD-10-CM | POA: Diagnosis not present

## 2018-08-26 DIAGNOSIS — L810 Postinflammatory hyperpigmentation: Secondary | ICD-10-CM | POA: Diagnosis not present

## 2018-09-01 ENCOUNTER — Other Ambulatory Visit
Admission: RE | Admit: 2018-09-01 | Discharge: 2018-09-01 | Disposition: A | Discharge status: Home / Self Care | Payer: Medicare Other | Source: Ambulatory Visit | Attending: Student in an Organized Health Care Education/Training Program | Admitting: Student in an Organized Health Care Education/Training Program

## 2018-09-01 ENCOUNTER — Other Ambulatory Visit: Payer: Medicare Other

## 2018-09-01 ENCOUNTER — Other Ambulatory Visit: Payer: Self-pay

## 2018-09-01 DIAGNOSIS — Z01812 Encounter for preprocedural laboratory examination: Secondary | ICD-10-CM | POA: Diagnosis not present

## 2018-09-01 DIAGNOSIS — Z1159 Encounter for screening for other viral diseases: Secondary | ICD-10-CM | POA: Insufficient documentation

## 2018-09-02 LAB — SARS CORONAVIRUS 2 (TAT 6-24 HRS): SARS Coronavirus 2: NEGATIVE

## 2018-09-06 ENCOUNTER — Encounter: Payer: Self-pay | Admitting: Student in an Organized Health Care Education/Training Program

## 2018-09-06 ENCOUNTER — Ambulatory Visit (HOSPITAL_BASED_OUTPATIENT_CLINIC_OR_DEPARTMENT_OTHER): Payer: Medicare Other | Admitting: Student in an Organized Health Care Education/Training Program

## 2018-09-06 ENCOUNTER — Other Ambulatory Visit: Payer: Self-pay | Admitting: Family Medicine

## 2018-09-06 ENCOUNTER — Ambulatory Visit
Admission: RE | Admit: 2018-09-06 | Discharge: 2018-09-06 | Disposition: A | Discharge status: Home / Self Care | Payer: Medicare Other | Source: Ambulatory Visit | Attending: Student in an Organized Health Care Education/Training Program | Admitting: Student in an Organized Health Care Education/Training Program

## 2018-09-06 ENCOUNTER — Other Ambulatory Visit: Payer: Self-pay

## 2018-09-06 VITALS — BP 121/84 | HR 70 | Temp 98.7°F | Resp 16 | Ht 71.0 in | Wt 260.0 lb

## 2018-09-06 DIAGNOSIS — M19012 Primary osteoarthritis, left shoulder: Secondary | ICD-10-CM

## 2018-09-06 DIAGNOSIS — E039 Hypothyroidism, unspecified: Secondary | ICD-10-CM

## 2018-09-06 MED ORDER — ROPIVACAINE HCL 2 MG/ML IJ SOLN
2.0000 mL | Freq: Once | INTRAMUSCULAR | Status: AC
  Administered 2018-09-06: 09:00:00 2 mL via EPIDURAL
  Filled 2018-09-06: qty 10

## 2018-09-06 MED ORDER — DIAZEPAM 5 MG PO TABS
5.0000 mg | ORAL_TABLET | Freq: Once | ORAL | Status: AC
  Administered 2018-09-06: 09:00:00 5 mg via ORAL
  Filled 2018-09-06: qty 1

## 2018-09-06 MED ORDER — LIDOCAINE HCL 2 % IJ SOLN
INTRAMUSCULAR | Status: AC
  Filled 2018-09-06: qty 20

## 2018-09-06 MED ORDER — FENTANYL CITRATE (PF) 100 MCG/2ML IJ SOLN
25.0000 ug | INTRAMUSCULAR | Status: DC | PRN
  Filled 2018-09-06: qty 2

## 2018-09-06 MED ORDER — LIDOCAINE HCL 2 % IJ SOLN
20.0000 mL | Freq: Once | INTRAMUSCULAR | Status: AC
  Administered 2018-09-06: 400 mg
  Filled 2018-09-06: qty 20

## 2018-09-06 MED ORDER — IOHEXOL 180 MG/ML  SOLN
10.0000 mL | Freq: Once | INTRAMUSCULAR | Status: AC
  Administered 2018-09-06: 10 mL via EPIDURAL

## 2018-09-06 MED ORDER — DEXAMETHASONE SODIUM PHOSPHATE 10 MG/ML IJ SOLN
10.0000 mg | Freq: Once | INTRAMUSCULAR | Status: AC
  Administered 2018-09-06: 10 mg
  Filled 2018-09-06: qty 1

## 2018-09-06 NOTE — Progress Notes (Signed)
Safety precautions to be maintained throughout the outpatient stay will include: orient to surroundings, keep bed in low position, maintain call bell within reach at all times, provide assistance with transfer out of bed and ambulation.  

## 2018-09-06 NOTE — Progress Notes (Signed)
Patient's Name: Michele Moore  MRN: [id number]  Referring Provider: Juline Patch, MD  DOB: 1950-06-02  PCP: Juline Patch, MD  DOS: 09/06/2018  Note by: Gillis Santa, MD  Service setting: Ambulatory outpatient  Specialty: Interventional Pain Management  Patient type: Established  Location: [facility] (AMB) Pain Management Facility  Visit type: Interventional Procedure   Primary Reason for Visit: Interventional Pain Management Treatment. CC: Shoulder Pain (left)  Procedure:          Anesthesia, Analgesia, Anxiolysis:  Type: Diagnostic Glenohumeral Joint (shoulder) Injection #1  Primary Purpose: Diagnostic Region: Superior Shoulder Area Level:  Shoulder Target Area: Glenohumeral Joint (shoulder) Approach: Posterolateral approach. Laterality: Left-Sided  Type: Moderate (Conscious) Sedation combined with Local Anesthesia, p.o. Valium Indication(s): Analgesia and Anxiety Route: P.o. Valium Sedation: Meaningful verbal contact was maintained at all times during the procedure  Local Anesthetic: Lidocaine 1-2%  Position: Supine   Indications: 1. Primary osteoarthritis of left shoulder    Pain Score: Pre-procedure: 7 /10 Post-procedure: 0-No pain/10  Pre-op Assessment:  Michele Moore is a 68 y.o. (year old), female patient, seen today for interventional treatment. She  has a past surgical history that includes Carpal tunnel release; Rotator cuff repair; Ankle surgery; Tubal ligation; Colonoscopy (2000?); Upper gi endoscopy (2000?); Tonsillectomy; Fusion of talonavicular joint (Right, 08/03/2015); Colonoscopy with propofol (N/A, 10/22/2017); Esophagogastroduodenoscopy (egd) with propofol (N/A, 10/22/2017); polypectomy (N/A, 10/22/2017); Joint replacement; and Total knee revision (Right, 01/20/2018). Michele Moore has a current medication list which includes the following prescription(s): albuterol, allopurinol, vitamin d3, clotrimazole, diclofenac sodium, ferrous sulfate, fluticasone, gabapentin, hydralazine,  hydrocodone-acetaminophen, hydrocodone-acetaminophen, hydrocodone-acetaminophen, levothyroxine, meclizine, multiple vitamins-calcium, omega-3 acid ethyl esters, omeprazole, polyethyl glycol-propyl glycol, potassium chloride sa, propylene glycol, synthroid, tizanidine, triamcinolone cream, triamterene-hydrochlorothiazide, and ranitidine, and the following Facility-Administered Medications: fentanyl. Her primarily concern today is the Shoulder Pain (left)  Initial Vital Signs:  Pulse/HCG Rate: 73  Temp: 98.7 F (37.1 C) Resp: 18 BP: 121/77 SpO2: 99 %  BMI: Estimated body mass index is 36.26 kg/m as calculated from the following:   Height as of this encounter: 5\' 11"  (1.803 m).   Weight as of this encounter: 260 lb (117.9 kg).  Risk Assessment: Allergies: Reviewed. She is allergic to ampicillin; penicillins; and vibramycin [doxycycline calcium].  Allergy Precautions: None required Coagulopathies: Reviewed. None identified.  Blood-thinner therapy: None at this time Active Infection(s): Reviewed. None identified. Michele Moore is afebrile  Site Confirmation: Michele Moore was asked to confirm the procedure and laterality before marking the site Procedure checklist: Completed Consent: Before the procedure and under the influence of no sedative(s), amnesic(s), or anxiolytics, the patient was informed of the treatment options, risks and possible complications. To fulfill our ethical and legal obligations, as recommended by the American Medical Association's Code of Ethics, I have informed the patient of my clinical impression; the nature and purpose of the treatment or procedure; the risks, benefits, and possible complications of the intervention; the alternatives, including doing nothing; the risk(s) and benefit(s) of the alternative treatment(s) or procedure(s); and the risk(s) and benefit(s) of doing nothing. The patient was provided information about the general risks and possible complications associated  with the procedure. These may include, but are not limited to: failure to achieve desired goals, infection, bleeding, organ or nerve damage, allergic reactions, paralysis, and death. In addition, the patient was informed of those risks and complications associated to the procedure, such as failure to decrease pain; infection; bleeding; organ or nerve damage with subsequent damage to sensory, motor, and/or autonomic  systems, resulting in permanent pain, numbness, and/or weakness of one or several areas of the body; allergic reactions; (i.e.: anaphylactic reaction); and/or death. Furthermore, the patient was informed of those risks and complications associated with the medications. These include, but are not limited to: allergic reactions (i.e.: anaphylactic or anaphylactoid reaction(s)); adrenal axis suppression; blood sugar elevation that in diabetics may result in ketoacidosis or comma; water retention that in patients with history of congestive heart failure may result in shortness of breath, pulmonary edema, and decompensation with resultant heart failure; weight gain; swelling or edema; medication-induced neural toxicity; particulate matter embolism and blood vessel occlusion with resultant organ, and/or nervous system infarction; and/or aseptic necrosis of one or more joints. Finally, the patient was informed that Medicine is not an exact science; therefore, there is also the possibility of unforeseen or unpredictable risks and/or possible complications that may result in a catastrophic outcome. The patient indicated having understood very clearly. We have given the patient no guarantees and we have made no promises. Enough time was given to the patient to ask questions, all of which were answered to the patient's satisfaction. Michele Moore has indicated that she wanted to continue with the procedure. Attestation: I, the ordering provider, attest that I have discussed with the patient the benefits, risks,  side-effects, alternatives, likelihood of achieving goals, and potential problems during recovery for the procedure that I have provided informed consent. Date   Time: 09/06/2018  8:05 AM  Pre-Procedure Preparation:  Monitoring: As per clinic protocol. Respiration, ETCO2, SpO2, BP, heart rate and rhythm monitor placed and checked for adequate function Safety Precautions: Patient was assessed for positional comfort and pressure points before starting the procedure. Time-out: I initiated and conducted the "Time-out" before starting the procedure, as per protocol. The patient was asked to participate by confirming the accuracy of the "Time Out" information. Verification of the correct person, site, and procedure were performed and confirmed by me, the nursing staff, and the patient. "Time-out" conducted as per Joint Commission's Universal Protocol (UP.01.01.01). Time: 0853  Description of Procedure:          Area Prepped: Entire shoulder Area Prepping solution: DuraPrep (Iodine Povacrylex [0.7% available iodine] and Isopropyl Alcohol, 74% w/w) Safety Precautions: Aspiration looking for blood return was conducted prior to all injections. At no point did we inject any substances, as a needle was being advanced. No attempts were made at seeking any paresthesias. Safe injection practices and needle disposal techniques used. Medications properly checked for expiration dates. SDV (single dose vial) medications used. Description of the Procedure: Protocol guidelines were followed. The patient was placed in position over the procedure table. The target area was identified and the area prepped in the usual manner. Skin & deeper tissues infiltrated with local anesthetic. Appropriate amount of time allowed to pass for local anesthetics to take effect. The procedure needles were then advanced to the target area. Proper needle placement secured. Negative aspiration confirmed. Solution injected in intermittent fashion,  asking for systemic symptoms every 0.5cc of injectate. The needles were then removed and the area cleansed, making sure to leave some of the prepping solution back to take advantage of its long term bactericidal properties.    Vitals:   09/06/18 0815 09/06/18 0845 09/06/18 0855 09/06/18 0902  BP: 121/77 128/76 137/85 121/84  Pulse: 73 73 67 70  Resp: 18 18 16 16   Temp: 98.7 F (37.1 C)     TempSrc: Oral     SpO2: 99% 97% 98% 98%  Weight: 260 lb (117.9 kg)     Height: 5\' 11"  (1.803 m)       Start Time: 0853 hrs. End Time: 0858 hrs. Materials:  Needle(s) Type: Spinal Needle Gauge: 22G Length: 3.5-in Medication(s): Please see orders for medications and dosing details. 5 cc solution made of 4 cc of 0.2% ropivacaine, 1 cc of Decadron 10 mg/cc. Imaging Guidance (Non-Spinal):          Type of Imaging Technique: Fluoroscopy Guidance (Non-Spinal) Indication(s): Assistance in needle guidance and placement for procedures requiring needle placement in or near specific anatomical locations not easily accessible without such assistance. Exposure Time: Please see nurses notes. Contrast: Before injecting any contrast, we confirmed that the patient did not have an allergy to iodine, shellfish, or radiological contrast. Once satisfactory needle placement was completed at the desired level, radiological contrast was injected. Contrast injected under live fluoroscopy. No contrast complications. See chart for type and volume of contrast used. Fluoroscopic Guidance: I was personally present during the use of fluoroscopy. "Tunnel Vision Technique" used to obtain the best possible view of the target area. Parallax error corrected before commencing the procedure. "Direction-depth-direction" technique used to introduce the needle under continuous pulsed fluoroscopy. Once target was reached, antero-posterior, oblique, and lateral fluoroscopic projection used confirm needle placement in all planes. Images  permanently stored in EMR. Interpretation: I personally interpreted the imaging intraoperatively. Adequate needle placement confirmed in multiple planes. Appropriate spread of contrast into desired area was observed. No evidence of afferent or efferent intravascular uptake. Permanent images saved into the patient's record.  Antibiotic Prophylaxis:   Anti-infectives (From admission, onward)   None     Indication(s): None identified  Post-operative Assessment:  Post-procedure Vital Signs:  Pulse/HCG Rate: 70  Temp: 98.7 F (37.1 C) Resp: 16 BP: 121/84 SpO2: 98 %  EBL: None  Complications: No immediate post-treatment complications observed by team, or reported by patient.  Note: The patient tolerated the entire procedure well. A repeat set of vitals were taken after the procedure and the patient was kept under observation following institutional policy, for this type of procedure. Post-procedural neurological assessment was performed, showing return to baseline, prior to discharge. The patient was provided with post-procedure discharge instructions, including a section on how to identify potential problems. Should any problems arise concerning this procedure, the patient was given instructions to immediately contact us, at any time, without hesitation. In any case, we plan to contact the patient by telephone for a follow-up status report regarding this interventional procedure.  Comments:  No additional relevant information. Improve range of motion for left shoulder abduction and adduction Plan of Care  Orders:  Orders Placed This Encounter  Procedures   DG PAIN CLIN[facility] C-ARM 1-60 MIN NO REPORT    Intraoperative interpretation by procedural physician at [facility].    Standing Status:   Standing    Number of Occurrences:   1    Order Specific Question:   Reason for exam:    Answer:   Assistance in needle guidance and placement for procedures requiring needle placement in  or near specific anatomical locations not easily accessible without such assistance.    Medications ordered for procedure: Meds ordered this encounter  Medications   iohexol (OMNIPAQUE) 180 MG/ML injection 10 mL    Must be Myelogram-compatible. If not available, you may substitute with a water-soluble, non-ionic, hypoallergenic, myelogram-compatible radiological contrast medium.   lidocaine (XYLOCAINE) 2 % (with pres) injection 400 mg   fentaNYL (SUBLIMAZE) injection 25-50  mcg    Make sure Narcan is available in the pyxis when using this medication. In the event of respiratory depression (RR< 8/min): Titrate NARCAN (naloxone) in increments of 0.1 to 0.2 mg IV at 2-3 minute intervals, until desired degree of reversal.   dexamethasone (DECADRON) injection 10 mg   ropivacaine (PF) 2 mg/mL (0.2%) (NAROPIN) injection 2 mL   diazepam (VALIUM) tablet 5 mg   Medications administered: We administered iohexol, lidocaine, dexamethasone, ropivacaine (PF) 2 mg/mL (0.2%), and diazepam.  See the medical record for exact dosing, route, and time of administration.  Follow-up plan:   Return in about 4 weeks (around 10/04/2018) for Post Procedure Evaluation, virtual.     Recent Visits Date Type Provider Dept  08/23/18 Procedure visit Gillis Santa, MD [facility]  08/19/18 Office Visit Gillis Santa, MD [facility]  Showing recent visits within past 90 days and meeting all other requirements   Today's Visits Date Type Provider Dept  09/06/18 Procedure visit Gillis Santa, MD [facility]  Showing today's visits and meeting all other requirements   Future Appointments Date Type Provider Dept  10/04/18 Appointment Gillis Santa, MD [facility]  11/18/18 Appointment Gillis Santa, MD [facility]  Showing future appointments within next 90 days and meeting all other requirements   Disposition: Discharge home  Discharge Date & Time: 09/06/2018;  0905 hrs.   Primary Care Physician: Juline Patch, MD Location: [facility] Note by: Gillis Santa, MD Date: 09/06/2018; Time: 9:22 AM  Disclaimer:  Medicine is not an Chief Strategy Officer. The only guarantee in medicine is that nothing is guaranteed. It is important to note that the decision to proceed with this intervention was based on the information collected from the patient. The Data and conclusions were drawn from the patient's questionnaire, the interview, and the physical examination. Because the information was provided in large part by the patient, it cannot be guaranteed that it has not been purposely or unconsciously manipulated. Every effort has been made to obtain as much relevant data as possible for this evaluation. It is important to note that the conclusions that lead to this procedure are derived in large part from the available data. Always take into account that the treatment will also be dependent on availability of resources and existing treatment guidelines, considered by other Pain Management Practitioners as being common knowledge and practice, at the time of the intervention. For Medico-Legal purposes, it is also important to point out that variation in procedural techniques and pharmacological choices are the acceptable norm. The indications, contraindications, technique, and results of the above procedure should only be interpreted and judged by a Board-Certified Interventional Pain Specialist with extensive familiarity and expertise in the same exact procedure and technique.

## 2018-09-06 NOTE — Patient Instructions (Signed)

## 2018-09-07 ENCOUNTER — Telehealth: Payer: Self-pay

## 2018-09-07 NOTE — Telephone Encounter (Signed)
Post procedure phone call.  LM 

## 2018-09-30 ENCOUNTER — Ambulatory Visit (INDEPENDENT_AMBULATORY_CARE_PROVIDER_SITE_OTHER): Payer: Medicare Other | Admitting: Family Medicine

## 2018-09-30 ENCOUNTER — Encounter: Payer: Self-pay | Admitting: Emergency Medicine

## 2018-09-30 ENCOUNTER — Other Ambulatory Visit: Payer: Self-pay

## 2018-09-30 ENCOUNTER — Emergency Department: Payer: Medicare Other

## 2018-09-30 ENCOUNTER — Other Ambulatory Visit: Payer: Medicare Other

## 2018-09-30 ENCOUNTER — Observation Stay
Admission: EM | Admit: 2018-09-30 | Discharge: 2018-10-02 | Disposition: A | Discharge status: Home / Self Care | Payer: Medicare Other | Attending: Specialist | Admitting: Specialist

## 2018-09-30 ENCOUNTER — Encounter: Payer: Self-pay | Admitting: Student in an Organized Health Care Education/Training Program

## 2018-09-30 ENCOUNTER — Encounter: Payer: Self-pay | Admitting: Family Medicine

## 2018-09-30 VITALS — BP 128/64 | HR 87 | Ht 71.0 in | Wt 260.0 lb

## 2018-09-30 DIAGNOSIS — Z79899 Other long term (current) drug therapy: Secondary | ICD-10-CM | POA: Diagnosis not present

## 2018-09-30 DIAGNOSIS — E039 Hypothyroidism, unspecified: Secondary | ICD-10-CM | POA: Diagnosis present

## 2018-09-30 DIAGNOSIS — N183 Chronic kidney disease, stage 3 unspecified: Secondary | ICD-10-CM | POA: Diagnosis present

## 2018-09-30 DIAGNOSIS — G629 Polyneuropathy, unspecified: Secondary | ICD-10-CM | POA: Insufficient documentation

## 2018-09-30 DIAGNOSIS — Z880 Allergy status to penicillin: Secondary | ICD-10-CM | POA: Diagnosis not present

## 2018-09-30 DIAGNOSIS — K219 Gastro-esophageal reflux disease without esophagitis: Secondary | ICD-10-CM | POA: Diagnosis present

## 2018-09-30 DIAGNOSIS — E782 Mixed hyperlipidemia: Secondary | ICD-10-CM | POA: Diagnosis present

## 2018-09-30 DIAGNOSIS — M069 Rheumatoid arthritis, unspecified: Secondary | ICD-10-CM | POA: Diagnosis not present

## 2018-09-30 DIAGNOSIS — Z87891 Personal history of nicotine dependence: Secondary | ICD-10-CM | POA: Insufficient documentation

## 2018-09-30 DIAGNOSIS — J45909 Unspecified asthma, uncomplicated: Secondary | ICD-10-CM | POA: Diagnosis present

## 2018-09-30 DIAGNOSIS — I10 Essential (primary) hypertension: Secondary | ICD-10-CM

## 2018-09-30 DIAGNOSIS — M109 Gout, unspecified: Secondary | ICD-10-CM | POA: Insufficient documentation

## 2018-09-30 DIAGNOSIS — Z791 Long term (current) use of non-steroidal anti-inflammatories (NSAID): Secondary | ICD-10-CM | POA: Diagnosis not present

## 2018-09-30 DIAGNOSIS — Z20828 Contact with and (suspected) exposure to other viral communicable diseases: Secondary | ICD-10-CM | POA: Diagnosis not present

## 2018-09-30 DIAGNOSIS — R0789 Other chest pain: Secondary | ICD-10-CM | POA: Diagnosis not present

## 2018-09-30 DIAGNOSIS — I129 Hypertensive chronic kidney disease with stage 1 through stage 4 chronic kidney disease, or unspecified chronic kidney disease: Secondary | ICD-10-CM | POA: Insufficient documentation

## 2018-09-30 DIAGNOSIS — Z79890 Hormone replacement therapy (postmenopausal): Secondary | ICD-10-CM | POA: Insufficient documentation

## 2018-09-30 DIAGNOSIS — R079 Chest pain, unspecified: Secondary | ICD-10-CM | POA: Diagnosis not present

## 2018-09-30 DIAGNOSIS — Z1159 Encounter for screening for other viral diseases: Secondary | ICD-10-CM | POA: Insufficient documentation

## 2018-09-30 DIAGNOSIS — R0602 Shortness of breath: Secondary | ICD-10-CM | POA: Diagnosis not present

## 2018-09-30 DIAGNOSIS — I493 Ventricular premature depolarization: Secondary | ICD-10-CM | POA: Diagnosis not present

## 2018-09-30 DIAGNOSIS — R0609 Other forms of dyspnea: Secondary | ICD-10-CM

## 2018-09-30 DIAGNOSIS — K449 Diaphragmatic hernia without obstruction or gangrene: Secondary | ICD-10-CM | POA: Insufficient documentation

## 2018-09-30 DIAGNOSIS — R42 Dizziness and giddiness: Secondary | ICD-10-CM

## 2018-09-30 LAB — BASIC METABOLIC PANEL
Anion gap: 9 (ref 5–15)
BUN: 30 mg/dL — ABNORMAL HIGH (ref 8–23)
CO2: 23 mmol/L (ref 22–32)
Calcium: 9.5 mg/dL (ref 8.9–10.3)
Chloride: 106 mmol/L (ref 98–111)
Creatinine, Ser: 1.72 mg/dL — ABNORMAL HIGH (ref 0.44–1.00)
GFR calc Af Amer: 35 mL/min — ABNORMAL LOW (ref >60)
GFR calc non Af Amer: 30 mL/min — ABNORMAL LOW (ref >60)
Glucose, Bld: 99 mg/dL (ref 70–99)
Potassium: 4.2 mmol/L (ref 3.5–5.1)
Sodium: 138 mmol/L (ref 135–145)

## 2018-09-30 LAB — CBC
HCT: 30.5 % — ABNORMAL LOW (ref 36.0–46.0)
Hemoglobin: 10.1 g/dL — ABNORMAL LOW (ref 12.0–15.0)
MCH: 30.9 pg (ref 26.0–34.0)
MCHC: 33.1 g/dL (ref 30.0–36.0)
MCV: 93.3 fL (ref 80.0–100.0)
Platelets: 170 10*3/uL (ref 150–400)
RBC: 3.27 MIL/uL — ABNORMAL LOW (ref 3.87–5.11)
RDW: 14.3 % (ref 11.5–15.5)
WBC: 4.4 10*3/uL (ref 4.0–10.5)
nRBC: 0 % (ref 0.0–0.2)

## 2018-09-30 LAB — TROPONIN I (HIGH SENSITIVITY)
Troponin I (High Sensitivity): 9 ng/L (ref <18)
Troponin I (High Sensitivity): 9 ng/L (ref <18)

## 2018-09-30 MED ORDER — ACETAMINOPHEN 650 MG RE SUPP: 650.0000 mg | Freq: Four times a day (QID) | RECTAL | Status: DC | PRN

## 2018-09-30 MED ORDER — PANTOPRAZOLE SODIUM 40 MG PO TBEC
40.0000 mg | DELAYED_RELEASE_TABLET | Freq: Every day | ORAL | Status: DC
  Administered 2018-10-01 – 2018-10-02 (×2): 40 mg via ORAL
  Filled 2018-09-30 (×2): qty 1

## 2018-09-30 MED ORDER — ATORVASTATIN CALCIUM 20 MG PO TABS
40.0000 mg | ORAL_TABLET | Freq: Every day | ORAL | Status: DC
  Administered 2018-10-01: 40 mg via ORAL
  Filled 2018-09-30: qty 2

## 2018-09-30 MED ORDER — ASPIRIN 81 MG PO CHEW
324.0000 mg | CHEWABLE_TABLET | Freq: Once | ORAL | Status: AC
  Administered 2018-09-30: 324 mg via ORAL
  Filled 2018-09-30: qty 4

## 2018-09-30 MED ORDER — LEVOTHYROXINE SODIUM 25 MCG PO TABS: 25.0000 ug | ORAL_TABLET | Freq: Every day | ORAL | Status: DC

## 2018-09-30 MED ORDER — OXYCODONE HCL 5 MG PO TABS
5.0000 mg | ORAL_TABLET | ORAL | Status: DC | PRN
  Administered 2018-10-01 – 2018-10-02 (×7): 5 mg via ORAL
  Filled 2018-09-30 (×7): qty 1

## 2018-09-30 MED ORDER — SODIUM CHLORIDE 0.9% FLUSH
3.0000 mL | Freq: Once | INTRAVENOUS | Status: AC
  Administered 2018-10-01: 3 mL via INTRAVENOUS

## 2018-09-30 MED ORDER — ONDANSETRON HCL 4 MG/2ML IJ SOLN: 4.0000 mg | Freq: Four times a day (QID) | INTRAMUSCULAR | Status: DC | PRN

## 2018-09-30 MED ORDER — ACETAMINOPHEN 325 MG PO TABS
650.0000 mg | ORAL_TABLET | Freq: Four times a day (QID) | ORAL | Status: DC | PRN
  Administered 2018-10-02: 650 mg via ORAL
  Filled 2018-09-30: qty 2

## 2018-09-30 MED ORDER — GABAPENTIN 600 MG PO TABS
600.0000 mg | ORAL_TABLET | Freq: Every day | ORAL | Status: DC
  Administered 2018-10-01 (×2): 600 mg via ORAL
  Filled 2018-09-30 (×2): qty 1

## 2018-09-30 MED ORDER — ONDANSETRON HCL 4 MG PO TABS: 4.0000 mg | ORAL_TABLET | Freq: Four times a day (QID) | ORAL | Status: DC | PRN

## 2018-09-30 MED ORDER — HEPARIN SODIUM (PORCINE) 5000 UNIT/ML IJ SOLN
5000.0000 [IU] | Freq: Three times a day (TID) | INTRAMUSCULAR | Status: DC
  Administered 2018-10-01: 5000 [IU] via SUBCUTANEOUS
  Filled 2018-09-30: qty 1

## 2018-09-30 MED ORDER — LEVOTHYROXINE SODIUM 100 MCG PO TABS: 200.0000 ug | ORAL_TABLET | Freq: Every day | ORAL | Status: DC

## 2018-09-30 NOTE — ED Provider Notes (Signed)
[facility] Emergency Department Provider Note  ____________________________________________   I have reviewed the triage vital signs and the nursing notes. Where available I have reviewed prior notes and, if possible and indicated, outside hospital notes.   Patient seen and evaluated during the coronavirus epidemic during a time with low staffing  Patient seen for the symptoms described in the history of present illness. She was evaluated in the context of the global COVID-19 pandemic, which necessitated consideration that the patient might be at risk for infection with the SARS-CoV-2 virus that causes COVID-19. Institutional protocols and algorithms that pertain to the evaluation of patients at risk for COVID-19 are in a state of rapid change based on information released by regulatory bodies including the CDC and federal and state organizations. These policies and algorithms were followed during the patient's care in the ED.    HISTORY  Chief Complaint Chest Pain    HPI Michele Moore is a 68 y.o. female  Who is a remarkably poor historian presents with the below listed medical problems she is also admitted in late last year for bigeminy and bradycardia, patient presents complaining of  "she sent me here", she states she went to her doctor for routine checkup and getting her thyroid tested and then next thing she knew she was told that she had come here.  She is upset that there was a wait in the waiting room.  I have tried to voice understanding of her concerns.  Patient is further able to tell me that she is been having chest pain today.  She states she gets it "now and again", notes from outside provider suggested she is having exertional chest pain.  When I asked her if it is exertional she says "I do not know" and then says "now and again I told you" she denies any fever or chills.  She states that this pain is been going on for "a minute" more specifically it appears  it today at least is been going on since 1:00.  She is on disability she did not do any exertional activities today.  Pain began at rest.  Nonradiating.  She states that it hurts to touch her chest wall.  Patient denies cardiac history.  Patient notes suggest that she is having exertional pain which again is very difficult to get a history.  I asked her if she was having palpitations that she states "maybe a little".  Patient has had similar events in the past she was found to have bigeminy apparently as an outpatient and sent here.  She states the pain feels "a little bit better may be" it is worse when she touches it and worse when she changes position as long she sitting still does not seem to bother her.  No personal or family history of PE or DVT. History otherwise unremarkable.  There is a certain degree of clarity that seems to been obtained as an outpatient but does not appear to be available to me now speaking terms of her history.  Past Medical History:  Diagnosis Date  . Allergy   . Anemia   . Arthritis   . Asthma   . Chronic kidney disease    STAGE 3 PER DR EASON 08/02/15  . Collagen vascular disease ([place])   . Family history of adverse reaction to anesthesia    sister difficult to put to sleep  . GERD (gastroesophageal reflux disease)   . H/O tooth extraction    all lower  teeth 1/19  . Hemorrhoid   . History of hiatal hernia   . Hypertension   . Hypothyroidism   . Migraines   . Mixed hyperlipidemia   . Multiple gastric ulcers   . Thyroid disease   . Wears dentures    partial upper and lower    Patient Active Problem List   Diagnosis Date Noted  . Female pelvic pain 01/20/2018  . History of revision of total knee arthroplasty 01/20/2018  . Iron deficiency anemia   . Heme + stool   . Acute gastritis without hemorrhage   . Gastric polyps   . Benign neoplasm of ascending colon   . Benign neoplasm of transverse colon   . Polyp of sigmoid colon   . Hypertension 10/01/2017   . Hypothyroidism 10/01/2017  . Gastroesophageal reflux disease 10/01/2017  . Hypokalemia 10/01/2017  . Mixed hyperlipidemia 10/01/2017  . Reactive airway disease 10/01/2017  . Bradycardia 08/24/2017  . Severe obesity (BMI 35.0-35.9 with comorbidity) ([place]) 08/18/2017  . Chronic gouty arthropathy without tophi 06/25/2017  . Encounter for long-term (current) use of high-risk medication 06/25/2017  . Positive ANA (antinuclear antibody) 06/17/2017  . Cervical facet syndrome 06/16/2017  . Osteoarthritis of right shoulder due to rotator cuff injury 06/16/2017  . Sprain of anterior talofibular ligament of right ankle 06/16/2017  . Lumbar radiculopathy 04/21/2017  . Lumbar degenerative disc disease 04/21/2017  . Chronic pain syndrome 04/21/2017  . Spinal stenosis, lumbar region, with neurogenic claudication 04/21/2017  . SS-A antibody positive 03/05/2017  . Pain, lower extremity 08/03/2015  . DOE (dyspnea on exertion) 12/28/2013    Past Surgical History:  Procedure Laterality Date  . ANKLE SURGERY    . CARPAL TUNNEL RELEASE     x3  . COLONOSCOPY  2000?  . COLONOSCOPY WITH PROPOFOL N/A 10/22/2017   Procedure: COLONOSCOPY WITH PROPOFOL;  Surgeon: Lucilla Lame, MD;  Location: [place];  Service: Endoscopy;  Laterality: N/A;  . ESOPHAGOGASTRODUODENOSCOPY (EGD) WITH PROPOFOL N/A 10/22/2017   Procedure: ESOPHAGOGASTRODUODENOSCOPY (EGD) WITH PROPOFOL;  Surgeon: Lucilla Lame, MD;  Location: [place];  Service: Endoscopy;  Laterality: N/A;  . FUSION OF TALONAVICULAR JOINT Right 08/03/2015   Procedure: TAILOR NAVICULAR JOINT FUSION - RIGHT ;  Surgeon: [name], DPM;  Location: [facility];  Service: Podiatry;  Laterality: Right;  . JOINT REPLACEMENT     knee x 3 ,right x1 and left x2  . POLYPECTOMY N/A 10/22/2017   Procedure: POLYPECTOMY;  Surgeon: Lucilla Lame, MD;  Location: [place];  Service: Endoscopy;  Laterality: N/A;  . ROTATOR CUFF REPAIR    .  TONSILLECTOMY    . TOTAL KNEE REVISION Right 01/20/2018   Procedure: POLYETHYLENE EXCHANGE;  Surgeon: Dereck Leep, MD;  Location: [facility];  Service: Orthopedics;  Laterality: Right;  . TUBAL LIGATION    . UPPER GI ENDOSCOPY  2000?    Prior to Admission medications   Medication Sig Start Date End Date Taking? Authorizing Provider  albuterol (PROVENTIL HFA;VENTOLIN HFA) 108 (90 Base) MCG/ACT inhaler Inhale 2 puffs into the lungs every 4 (four) hours as needed for wheezing. 10/01/17   Juline Patch, MD  allopurinol (ZYLOPRIM) 100 MG tablet Take 2 tablets by mouth daily. Dr Barb Merino 08/06/17   [provider]  Cholecalciferol (VITAMIN D3) 2000 units TABS Take 2,000 Units by mouth daily.    [provider]  clotrimazole (LOTRIMIN) 1 % cream Apply to affected area 2 times daily 04/05/18   Coral Spikes, DO  diclofenac sodium (VOLTAREN) 1 % GEL Apply 2 g topically 3 (three) times daily. [facility] 08/11/17   [provider]  ferrous sulfate 325 (65 FE) MG tablet Take 325 mg by mouth daily with breakfast. otc    [provider]  fluticasone (FLONASE) 50 MCG/ACT nasal spray SHAKE LIQUID AND USE 1 SPRAY IN EACH NOSTRIL DAILY 08/23/18   Juline Patch, MD  gabapentin (NEURONTIN) 600 MG tablet Take 1 tablet (600 mg total) by mouth at bedtime. 08/19/18 11/17/18  Gillis Santa, MD  hydrALAZINE (APRESOLINE) 50 MG tablet Take 1 tablet (50 mg total) by mouth 2 (two) times daily. 07/14/18   Juline Patch, MD  HYDROcodone-acetaminophen (NORCO) 7.5-325 MG tablet Take 1 tablet by mouth every 8 (eight) hours as needed for up to 30 days for severe pain. Must last 30 days. 09/02/18 10/02/18  Gillis Santa, MD  HYDROcodone-acetaminophen (NORCO) 7.5-325 MG tablet Take 1 tablet by mouth every 8 (eight) hours as needed for up to 30 days for severe pain. Must last 30 days. 10/02/18 11/01/18  Gillis Santa, MD  HYDROcodone-acetaminophen (NORCO) 7.5-325 MG tablet Take 1 tablet by mouth  every 8 (eight) hours as needed for up to 30 days for severe pain. Must last 30 days. 11/01/18 12/01/18  Gillis Santa, MD  levothyroxine (SYNTHROID) 200 MCG tablet TAKE 1 TABLET(200 MCG) BY MOUTH DAILY BEFORE BREAKFAST 07/14/18   Juline Patch, MD  meclizine (ANTIVERT) 12.5 MG tablet Take 1 tablet (12.5 mg total) by mouth 2 (two) times daily as needed for dizziness. 09/14/17   Juline Patch, MD  Multiple Vitamins-Calcium (ONE-A-DAY WOMENS FORMULA PO) Take 1 tablet by mouth daily.    [provider]  omega-3 acid ethyl esters (LOVAZA) 1 g capsule Take 1 capsule (1 g total) by mouth 2 (two) times daily. 07/14/18   Juline Patch, MD  omeprazole (PRILOSEC) 40 MG capsule TAKE 1 CAPSULE(40 MG) BY MOUTH DAILY 07/14/18   Juline Patch, MD  Polyethyl Glycol-Propyl Glycol (SYSTANE OP) Place 1 drop into both eyes daily as needed (dry eyes).     [provider]  potassium chloride SA (K-DUR) 20 MEQ tablet Take 0.5 tablets (10 mEq total) by mouth daily. 07/14/18   Juline Patch, MD  Propylene Glycol (SYSTANE BALANCE OP) Apply 1 drop to eye 2 (two) times daily.    [provider]  SYNTHROID 25 MCG tablet TAKE 1 TABLET BY MOUTH EVERY DAY BEFORE BREAKFAST. TAKE WITH 200 MCG TABLETS FOR TOTAL OF 225 MCG. 09/06/18   Juline Patch, MD  tiZANidine (ZANAFLEX) 4 MG capsule Take 1 capsule (4 mg total) by mouth 2 (two) times daily as needed. 07/29/18   Gillis Santa, MD  triamcinolone cream (KENALOG) 0.1 % Apply 1 application topically every morning.    [provider]  triamterene-hydrochlorothiazide (MAXZIDE) 75-50 MG tablet Take 1 tablet by mouth daily. 07/14/18   Juline Patch, MD    Allergies Ampicillin, Penicillins, and Vibramycin [doxycycline calcium]  Family History  Problem Relation Age of Onset  . COPD Sister   . Cancer Maternal Aunt        breast  . Cancer Maternal Uncle        kidney    Social History Social History   Tobacco Use  . Smoking status: Former  Smoker    Quit date: 02/24/1993    Years since quitting: 25.6  . Smokeless tobacco: Never Used  Substance Use Topics  . Alcohol use:  No    Alcohol/week: 0.0 standard drinks  . Drug use: No    Review of Systems Constitutional: No fever/chills Eyes: No visual changes. ENT: No sore throat. No stiff neck no neck pain Cardiovascular: + chest pain. Respiratory: "Sometimes I have" shortness of breath. Gastrointestinal:   no vomiting.  No diarrhea.  No constipation. Genitourinary: Negative for dysuria. Musculoskeletal: Negative lower extremity swelling, "my legs always hurt because of my arthritis" Skin: Negative for rash. Neurological: Negative for severe headaches, focal weakness or numbness.   ____________________________________________   PHYSICAL EXAM:  VITAL SIGNS: ED Triage Vitals  Enc Vitals Group     BP 09/30/18 1705 (!) 154/73     Pulse Rate 09/30/18 1705 86     Resp 09/30/18 1705 16     Temp 09/30/18 1705 98 F (36.7 C)     Temp Source 09/30/18 1705 Oral     SpO2 09/30/18 1705 100 %     Weight --      Height --      Head Circumference --      Peak Flow --      Pain Score 09/30/18 1711 8     Pain Loc --      Pain Edu? --      Excl. in [place]? --     Constitutional: Alert and oriented. Well appearing and in no acute distress. Eyes: Conjunctivae are normal Head: Atraumatic HEENT: No congestion/rhinnorhea. Mucous membranes are moist.  Oropharynx non-erythematous Neck:   Nontender with no meningismus, no masses, no stridor Chest: Tender palpation left chest wall necessary patient states "accessed the pain right there" and pulls back no crepitus no flail chest, Cardiovascular: Normal rate, somewhat irregular rhythm grossly normal heart sounds.  Good peripheral circulation. Respiratory: Normal respiratory effort.  No retractions. Lungs CTAB. Abdominal: Soft and nontender. No distention. No guarding no rebound Back:  There is no focal tenderness or step off.  there is no  midline tenderness there are no lesions noted. there is no CVA tenderness Musculoskeletal: No lower extremity tenderness, no upper extremity tenderness. No joint effusions, no DVT signs strong distal pulses no edema Neurologic:  Normal speech and language. No gross focal neurologic deficits are appreciated.  Skin:  Skin is warm, dry and intact. No rash noted. Psychiatric: Mood and affect are normal. Speech and behavior are normal.  ____________________________________________   LABS (all labs ordered are listed, but only abnormal results are displayed)  Labs Reviewed  BASIC METABOLIC PANEL - Abnormal; Notable for the following components:      Result Value   BUN 30 (*)    Creatinine, Ser 1.72 (*)    GFR calc non Af Amer 30 (*)    GFR calc Af Amer 35 (*)    All other components within normal limits  CBC - Abnormal; Notable for the following components:   RBC 3.27 (*)    Hemoglobin 10.1 (*)    HCT 30.5 (*)    All other components within normal limits  TROPONIN I (HIGH SENSITIVITY)  TROPONIN I (HIGH SENSITIVITY)    Pertinent labs  results that were available during my care of the patient were reviewed by me and considered in my medical decision making (see chart for details). ____________________________________________  EKG  I personally interpreted any EKGs ordered by me or triage Sinus rhythm with premature ventricular complexes, sometimes in bigeminy, no acute ST elevation or depression, ____________________________________________  RADIOLOGY  Pertinent labs & imaging results that were available during my care  of the patient were reviewed by me and considered in my medical decision making (see chart for details). If possible, patient and/or family made aware of any abnormal findings.  Dg Chest 2 View  Result Date: 09/30/2018 CLINICAL DATA:  Referral from PCP with abnormal EKG, chest pain for 1 day and shortness of breath with ambulation EXAM: CHEST - 2 VIEW COMPARISON:   Radiograph and chest CT 09/07/2017 FINDINGS: No consolidation, features of edema, pneumothorax, or effusion. Pulmonary vascularity is normally distributed. The cardiomediastinal contours are unremarkable. No acute osseous or soft tissue abnormality. Degenerative changes are present in the imaged spine and shoulders. IMPRESSION: No acute cardiopulmonary abnormality. Electronically Signed   By: Lovena Le M.D.   On: 09/30/2018 17:40   ____________________________________________    PROCEDURES  Procedure(s) performed: None  Procedures  Critical Care performed: None  ____________________________________________   INITIAL IMPRESSION / ASSESSMENT AND PLAN / ED COURSE  Pertinent labs & imaging results that were available during my care of the patient were reviewed by me and considered in my medical decision making (see chart for details).   Patient here with reproducible chest wall pain of indeterminate duration.  Last time she had chest pain we did do a CT scan on her which was negative.  I do not have a high suspicion for PE.  Is not pleuritic to the extent that I can determine.  History is somewhat limited.  I have 1 set of negative troponins fortunately, the EKG well shows PVCs which she has had before does not show evidence of acute coronary syndrome.  Patient does have considerable risk factors for ACS, and is apparently endorsing exertional discomfort to some people and exertional dyspnea.  Lots of sniffing concern even if her troponins and EKG are reassuring at this time.  I do not see evidence of a prior heart catheterization and she states she has not had a stress test in several years although she has had on the most recent I can find is 2015.  Therefore, patient is a elderly patient with multiple risk factors for ACS with chest pain which is somewhat reproducible but which also is reported to be exertional and associated with dyspnea.  Patient likely benefit from further evaluation as an  inpatient    ____________________________________________   FINAL CLINICAL IMPRESSION(S) / ED DIAGNOSES  Final diagnoses:  None      This chart was dictated using voice recognition software.  Despite best efforts to proofread,  errors can occur which can change meaning.      Schuyler Amor, MD 09/30/18 2037

## 2018-09-30 NOTE — ED Triage Notes (Signed)
Pt sent from PCP with c/o abdnorm EKG. PT states she is having CP since yesterday and SOB with ambulation. Pt A&OX4, NAD noted

## 2018-09-30 NOTE — ED Notes (Signed)
.. ED TO INPATIENT HANDOFF REPORT  ED Nurse Name and Phone #: Deneise Lever [phone]  S Name/Age/Gender Michele Moore 68 y.o. female Room/Bed: ED09A/ED09A  Code Status   Code Status: Prior  Home/SNF/Other Home Patient oriented to: self, place, time and situation Is this baseline? Yes   Triage Complete: Triage complete  Chief Complaint Chest Pain  Triage Note Pt sent from PCP with c/o abdnorm EKG. PT states she is having CP since yesterday and SOB with ambulation. Pt A&OX4, NAD noted    Allergies Allergies  Allergen Reactions  . Ampicillin Swelling  . Penicillins Anaphylaxis and Swelling    Has patient had a PCN reaction causing immediate rash, facial/tongue/throat swelling, SOB or lightheadedness with hypotension: Yes Has patient had a PCN reaction causing severe rash involving mucus membranes or skin necrosis: No Has patient had a PCN reaction that required hospitalization: No Has patient had a PCN reaction occurring within the last 10 years: Yes If all of the above answers are "NO", then may proceed with Cephalosporin use.  . Vibramycin [Doxycycline Calcium] Rash    Level of Care/Admitting Diagnosis ED Disposition    ED Disposition Condition [facility] Area: [place] [100120]  Level of Care: Telemetry [5]  Covid Evaluation: Asymptomatic Screening Protocol (No Symptoms)  Diagnosis: Exertional chest pain [297989]  Admitting Physician: Lance Coon [[id number]]  Attending Physician: Lance Coon [[id number]]  Bed request comments: 2a  PT Class (Do Not Modify): Observation [104]  PT Acc Code (Do Not Modify): Observation [10022]       B Medical/Surgery History Past Medical History:  Diagnosis Date  . Allergy   . Anemia   . Arthritis   . Asthma   . Chronic kidney disease    STAGE 3 PER DR EASON 08/02/15  . Collagen vascular disease ([place])   . Family history of adverse reaction to anesthesia    sister difficult to put to sleep  . GERD  (gastroesophageal reflux disease)   . H/O tooth extraction    all lower teeth 1/19  . Hemorrhoid   . History of hiatal hernia   . Hypertension   . Hypothyroidism   . Migraines   . Mixed hyperlipidemia   . Multiple gastric ulcers   . Thyroid disease   . Wears dentures    partial upper and lower   Past Surgical History:  Procedure Laterality Date  . ANKLE SURGERY    . CARPAL TUNNEL RELEASE     x3  . COLONOSCOPY  2000?  . COLONOSCOPY WITH PROPOFOL N/A 10/22/2017   Procedure: COLONOSCOPY WITH PROPOFOL;  Surgeon: Lucilla Lame, MD;  Location: [facility];  Service: Endoscopy;  Laterality: N/A;  . ESOPHAGOGASTRODUODENOSCOPY (EGD) WITH PROPOFOL N/A 10/22/2017   Procedure: ESOPHAGOGASTRODUODENOSCOPY (EGD) WITH PROPOFOL;  Surgeon: Lucilla Lame, MD;  Location: [facility];  Service: Endoscopy;  Laterality: N/A;  . FUSION OF TALONAVICULAR JOINT Right 08/03/2015   Procedure: TAILOR NAVICULAR JOINT FUSION - RIGHT ;  Surgeon: [name], DPM;  Location: [facility];  Service: Podiatry;  Laterality: Right;  . JOINT REPLACEMENT     knee x 3 ,right x1 and left x2  . POLYPECTOMY N/A 10/22/2017   Procedure: POLYPECTOMY;  Surgeon: Lucilla Lame, MD;  Location: [facility];  Service: Endoscopy;  Laterality: N/A;  . ROTATOR CUFF REPAIR    . TONSILLECTOMY    . TOTAL KNEE REVISION Right 01/20/2018   Procedure: POLYETHYLENE EXCHANGE;  Surgeon: Dereck Leep, MD;  Location: [facility];  Service: Orthopedics;  Laterality: Right;  . TUBAL LIGATION    . UPPER GI ENDOSCOPY  2000?     A IV Location/Drains/Wounds Patient Lines/Drains/Airways Status   Active Line/Drains/Airways    Name:   Placement date:   Placement time:   Site:   Days:   Peripheral IV 09/30/18 Right Antecubital   09/30/18    2127    Antecubital   less than 1   Epidural Catheter 09/06/18   09/06/18    0815     24   Incision (Closed) 10/22/17 Lip Other (Comment)   10/22/17    0911     343   Incision (Closed) 10/22/17  Rectum Other (Comment)   10/22/17    0911     343   Incision (Closed) 01/20/18 Leg Right   01/20/18    0902     253          Intake/Output Last 24 hours No intake or output data in the 24 hours ending 09/30/18 2313  Labs/Imaging Results for orders placed or performed during the hospital encounter of 09/30/18 (from the past 48 hour(s))  Basic metabolic panel     Status: Abnormal   Collection Time: 09/30/18  5:20 PM  Result Value Ref Range   Sodium 138 135 - 145 mmol/L   Potassium 4.2 3.5 - 5.1 mmol/L   Chloride 106 98 - 111 mmol/L   CO2 23 22 - 32 mmol/L   Glucose, Bld 99 70 - 99 mg/dL   BUN 30 (H) 8 - 23 mg/dL   Creatinine, Ser 1.72 (H) 0.44 - 1.00 mg/dL   Calcium 9.5 8.9 - 10.3 mg/dL   GFR calc non Af Amer 30 (L) >60 mL/min   GFR calc Af Amer 35 (L) >60 mL/min   Anion gap 9 5 - 15    Comment: Performed at [facility], [address] [place], [place] [address]  CBC     Status: Abnormal   Collection Time: 09/30/18  5:20 PM  Result Value Ref Range   WBC 4.4 4.0 - 10.5 K/uL   RBC 3.27 (L) 3.87 - 5.11 MIL/uL   Hemoglobin 10.1 (L) 12.0 - 15.0 g/dL   HCT 30.5 (L) 36.0 - 46.0 %   MCV 93.3 80.0 - 100.0 fL   MCH 30.9 26.0 - 34.0 pg   MCHC 33.1 30.0 - 36.0 g/dL   RDW 14.3 11.5 - 15.5 %   Platelets 170 150 - 400 K/uL   nRBC 0.0 0.0 - 0.2 %    Comment: Performed at [facility], [place], [place] [address]  Troponin I (High Sensitivity)     Status: None   Collection Time: 09/30/18  5:20 PM  Result Value Ref Range   Troponin I (High Sensitivity) 9 <18 ng/L    Comment: (NOTE) Elevated high sensitivity troponin I (hsTnI) values and significant  changes across serial measurements may suggest ACS but many other  chronic and acute conditions are known to elevate hsTnI results.  Refer to the "Links" section for chest pain algorithms and additional  guidance. Performed at [facility], [place], [place] [address]    Troponin I (High Sensitivity)     Status: None   Collection Time: 09/30/18  9:08 PM  Result Value Ref Range   Troponin I (High Sensitivity) 9 <18 ng/L    Comment: (NOTE) Elevated high sensitivity troponin I (hsTnI) values and significant  changes  across serial measurements may suggest ACS but many other  chronic and acute conditions are known to elevate hsTnI results.  Refer to the "Links" section for chest pain algorithms and additional  guidance. Performed at [facility], [address] [place], [place] [address]    Dg Chest 2 View  Result Date: 09/30/2018 CLINICAL DATA:  Referral from PCP with abnormal EKG, chest pain for 1 day and shortness of breath with ambulation EXAM: CHEST - 2 VIEW COMPARISON:  Radiograph and chest CT 09/07/2017 FINDINGS: No consolidation, features of edema, pneumothorax, or effusion. Pulmonary vascularity is normally distributed. The cardiomediastinal contours are unremarkable. No acute osseous or soft tissue abnormality. Degenerative changes are present in the imaged spine and shoulders. IMPRESSION: No acute cardiopulmonary abnormality. Electronically Signed   By: Lovena Le M.D.   On: 09/30/2018 17:40    Pending Labs Unresulted Labs (From admission, onward)    Start     Ordered   09/30/18 2059  SARS CORONAVIRUS 2 Nasal Swab Aptima Multi Swab  (Asymptomatic/Tier 2 Patients Labs)  ONCE - STAT,   STAT    Question Answer Comment  Is this test for diagnosis or screening Screening   Symptomatic for COVID-19 as defined by CDC No   Hospitalized for COVID-19 No   Admitted to ICU for COVID-19 No   Previously tested for COVID-19 Yes   Resident in a congregate (group) care setting No   Employed in healthcare setting No   Pregnant No      09/30/18 2058   Signed and Held  HIV antibody (Routine Testing)  Once,   R     Signed and Held   Signed and Held  CBC  (heparin)  Once,   R    Comments: Baseline for heparin therapy IF NOT ALREADY DRAWN.  Notify MD  if PLT < 100 K.    Signed and Held   Signed and Held  Creatinine, serum  (heparin)  Once,   R    Comments: Baseline for heparin therapy IF NOT ALREADY DRAWN.    Signed and Held   Signed and Held  Basic metabolic panel  Tomorrow morning,   R     Signed and Held   Signed and Held  CBC  Tomorrow morning,   R     Signed and Held          Vitals/Pain Today's Vitals   09/30/18 2145 09/30/18 2200 09/30/18 2215 09/30/18 2230  BP:    98/77  Pulse: (!) 53 64 (!) 54 (!) 50  Resp:      Temp:      TempSrc:      SpO2: 99% 100% 99% 98%  PainSc:        Isolation Precautions No active isolations  Medications Medications  sodium chloride flush (NS) 0.9 % injection 3 mL (has no administration in time range)  aspirin chewable tablet 324 mg (324 mg Oral Given 09/30/18 2107)    Mobility walks Low fall risk   Focused Assessments Cardiac Assessment Handoff:    Lab Results  Component Value Date   CKTOTAL 154 12/27/2013   CKMB 0.8 12/27/2013   TROPONINI <0.03 09/07/2017   No results found for: DDIMER Does the Patient currently have chest pain? Yes     R Recommendations: See Admitting Provider Note  Report given to:   Additional Notes:

## 2018-09-30 NOTE — H&P (Signed)
[facility] [place] NAME: Michele Moore    MR#:  [id number]  DATE OF BIRTH:  [birth year]  DATE OF ADMISSION:  09/30/2018  PRIMARY CARE PHYSICIAN: Juline Patch, MD   REQUESTING/REFERRING PHYSICIAN: Burlene Arnt, MD  CHIEF COMPLAINT:   Chief Complaint  Patient presents with  . Chest Pain    HISTORY OF PRESENT ILLNESS:  Michele Moore  is a 68 y.o. female who presents with chief complaint as above.  Patient presents with a complaint of chest pain.  She states that this pain is centrally located and radiates to the left side and through to the back.  She was seen at her PCPs office, who sent her over to the ED for evaluation when she noticed her heart rate to be significantly elevated.  Here in the ED the patient's chest pain is persistent.  Her initial work-up laboratory wise in cardiac wise is largely within normal limits.  Her heart rate is also normal here now.  She does state that her chest pain has been somewhat more exertional lately.  It is associated with some shortness of breath.  Hospitalist were called for admission and further evaluation given her persistent chest pain  PAST MEDICAL HISTORY:   Past Medical History:  Diagnosis Date  . Allergy   . Anemia   . Arthritis   . Asthma   . Chronic kidney disease    STAGE 3 PER DR EASON 08/02/15  . Collagen vascular disease ([place])   . Family history of adverse reaction to anesthesia    sister difficult to put to sleep  . GERD (gastroesophageal reflux disease)   . H/O tooth extraction    all lower teeth 1/19  . Hemorrhoid   . History of hiatal hernia   . Hypertension   . Hypothyroidism   . Migraines   . Mixed hyperlipidemia   . Multiple gastric ulcers   . Thyroid disease   . Wears dentures    partial upper and lower     PAST SURGICAL HISTORY:   Past Surgical History:  Procedure Laterality Date  . ANKLE SURGERY    . CARPAL TUNNEL RELEASE     x3  . COLONOSCOPY  2000?  .  COLONOSCOPY WITH PROPOFOL N/A 10/22/2017   Procedure: COLONOSCOPY WITH PROPOFOL;  Surgeon: Lucilla Lame, MD;  Location: [facility];  Service: Endoscopy;  Laterality: N/A;  . ESOPHAGOGASTRODUODENOSCOPY (EGD) WITH PROPOFOL N/A 10/22/2017   Procedure: ESOPHAGOGASTRODUODENOSCOPY (EGD) WITH PROPOFOL;  Surgeon: Lucilla Lame, MD;  Location: [facility];  Service: Endoscopy;  Laterality: N/A;  . FUSION OF TALONAVICULAR JOINT Right 08/03/2015   Procedure: TAILOR NAVICULAR JOINT FUSION - RIGHT ;  Surgeon: [name], DPM;  Location: [facility];  Service: Podiatry;  Laterality: Right;  . JOINT REPLACEMENT     knee x 3 ,right x1 and left x2  . POLYPECTOMY N/A 10/22/2017   Procedure: POLYPECTOMY;  Surgeon: Lucilla Lame, MD;  Location: [facility];  Service: Endoscopy;  Laterality: N/A;  . ROTATOR CUFF REPAIR    . TONSILLECTOMY    . TOTAL KNEE REVISION Right 01/20/2018   Procedure: POLYETHYLENE EXCHANGE;  Surgeon: Dereck Leep, MD;  Location: [facility];  Service: Orthopedics;  Laterality: Right;  . TUBAL LIGATION    . UPPER GI ENDOSCOPY  2000?     SOCIAL HISTORY:   Social History   Tobacco Use  . Smoking status: Former Smoker    Quit date: 02/24/1993  Years since quitting: 25.6  . Smokeless tobacco: Never Used  Substance Use Topics  . Alcohol use: No    Alcohol/week: 0.0 standard drinks     FAMILY HISTORY:   Family History  Problem Relation Age of Onset  . COPD Sister   . Cancer Maternal Aunt        breast  . Cancer Maternal Uncle        kidney     DRUG ALLERGIES:   Allergies  Allergen Reactions  . Ampicillin Swelling  . Penicillins Anaphylaxis and Swelling    Has patient had a PCN reaction causing immediate rash, facial/tongue/throat swelling, SOB or lightheadedness with hypotension: Yes Has patient had a PCN reaction causing severe rash involving mucus membranes or skin necrosis: No Has patient had a PCN reaction that required hospitalization: No Has  patient had a PCN reaction occurring within the last 10 years: Yes If all of the above answers are "NO", then may proceed with Cephalosporin use.  . Vibramycin [Doxycycline Calcium] Rash    MEDICATIONS AT HOME:   Prior to Admission medications   Medication Sig Start Date End Date Taking? Authorizing Provider  albuterol (PROVENTIL HFA;VENTOLIN HFA) 108 (90 Base) MCG/ACT inhaler Inhale 2 puffs into the lungs every 4 (four) hours as needed for wheezing. 10/01/17   Juline Patch, MD  allopurinol (ZYLOPRIM) 100 MG tablet Take 2 tablets by mouth daily. Dr Barb Merino 08/06/17   [provider]  Cholecalciferol (VITAMIN D3) 2000 units TABS Take 2,000 Units by mouth daily.    [provider]  clotrimazole (LOTRIMIN) 1 % cream Apply to affected area 2 times daily 04/05/18   Thersa Salt G, DO  diclofenac sodium (VOLTAREN) 1 % GEL Apply 2 g topically 3 (three) times daily. [facility] 08/11/17   [provider]  ferrous sulfate 325 (65 FE) MG tablet Take 325 mg by mouth daily with breakfast. otc    [provider]  fluticasone (FLONASE) 50 MCG/ACT nasal spray SHAKE LIQUID AND USE 1 SPRAY IN EACH NOSTRIL DAILY 08/23/18   Juline Patch, MD  gabapentin (NEURONTIN) 600 MG tablet Take 1 tablet (600 mg total) by mouth at bedtime. 08/19/18 11/17/18  Gillis Santa, MD  hydrALAZINE (APRESOLINE) 50 MG tablet Take 1 tablet (50 mg total) by mouth 2 (two) times daily. 07/14/18   Juline Patch, MD  HYDROcodone-acetaminophen (NORCO) 7.5-325 MG tablet Take 1 tablet by mouth every 8 (eight) hours as needed for up to 30 days for severe pain. Must last 30 days. 09/02/18 10/02/18  Gillis Santa, MD  HYDROcodone-acetaminophen (NORCO) 7.5-325 MG tablet Take 1 tablet by mouth every 8 (eight) hours as needed for up to 30 days for severe pain. Must last 30 days. 10/02/18 11/01/18  Gillis Santa, MD  HYDROcodone-acetaminophen (NORCO) 7.5-325 MG tablet Take 1 tablet by mouth every 8 (eight) hours as  needed for up to 30 days for severe pain. Must last 30 days. 11/01/18 12/01/18  Gillis Santa, MD  levothyroxine (SYNTHROID) 200 MCG tablet TAKE 1 TABLET(200 MCG) BY MOUTH DAILY BEFORE BREAKFAST 07/14/18   Juline Patch, MD  meclizine (ANTIVERT) 12.5 MG tablet Take 1 tablet (12.5 mg total) by mouth 2 (two) times daily as needed for dizziness. 09/14/17   Juline Patch, MD  Multiple Vitamins-Calcium (ONE-A-DAY WOMENS FORMULA PO) Take 1 tablet by mouth daily.    [provider]  omega-3 acid ethyl esters (LOVAZA) 1 g capsule Take 1 capsule (1 g total) by mouth  2 (two) times daily. 07/14/18   Juline Patch, MD  omeprazole (PRILOSEC) 40 MG capsule TAKE 1 CAPSULE(40 MG) BY MOUTH DAILY 07/14/18   Juline Patch, MD  Polyethyl Glycol-Propyl Glycol (SYSTANE OP) Place 1 drop into both eyes daily as needed (dry eyes).     [provider]  potassium chloride SA (K-DUR) 20 MEQ tablet Take 0.5 tablets (10 mEq total) by mouth daily. 07/14/18   Juline Patch, MD  Propylene Glycol (SYSTANE BALANCE OP) Apply 1 drop to eye 2 (two) times daily.    [provider]  SYNTHROID 25 MCG tablet TAKE 1 TABLET BY MOUTH EVERY DAY BEFORE BREAKFAST. TAKE WITH 200 MCG TABLETS FOR TOTAL OF 225 MCG. 09/06/18   Juline Patch, MD  tiZANidine (ZANAFLEX) 4 MG capsule Take 1 capsule (4 mg total) by mouth 2 (two) times daily as needed. 07/29/18   Gillis Santa, MD  triamcinolone cream (KENALOG) 0.1 % Apply 1 application topically every morning.    [provider]  triamterene-hydrochlorothiazide (MAXZIDE) 75-50 MG tablet Take 1 tablet by mouth daily. 07/14/18   Juline Patch, MD    REVIEW OF SYSTEMS:  Review of Systems  Constitutional: Negative for chills, fever, malaise/fatigue and weight loss.  HENT: Negative for ear pain, hearing loss and tinnitus.   Eyes: Negative for blurred vision, double vision, pain and redness.  Respiratory: Positive for shortness of breath. Negative for cough and  hemoptysis.   Cardiovascular: Positive for chest pain. Negative for palpitations, orthopnea and leg swelling.  Gastrointestinal: Negative for abdominal pain, constipation, diarrhea, nausea and vomiting.  Genitourinary: Negative for dysuria, frequency and hematuria.  Musculoskeletal: Negative for back pain, joint pain and neck pain.  Skin:       No acne, rash, or lesions  Neurological: Negative for dizziness, tremors, focal weakness and weakness.  Endo/Heme/Allergies: Negative for polydipsia. Does not bruise/bleed easily.  Psychiatric/Behavioral: Negative for depression. The patient is not nervous/anxious and does not have insomnia.      VITAL SIGNS:   Vitals:   09/30/18 1705 09/30/18 2100 09/30/18 2115  BP: (!) 154/73 (!) 114/52   Pulse: 86 67   Resp: 16    Temp: 98 F (36.7 C)    TempSrc: Oral    SpO2: 100% 97% 98%   Wt Readings from Last 3 Encounters:  09/30/18 117.9 kg  09/06/18 117.9 kg  08/23/18 115.2 kg    PHYSICAL EXAMINATION:  Physical Exam  Vitals reviewed. Constitutional: She is oriented to person, place, and time. She appears well-developed and well-nourished. No distress.  HENT:  Head: Normocephalic and atraumatic.  Mouth/Throat: Oropharynx is clear and moist.  Eyes: Pupils are equal, round, and reactive to light. Conjunctivae and EOM are normal. No scleral icterus.  Neck: Normal range of motion. Neck supple. No JVD present. No thyromegaly present.  Cardiovascular: Normal rate, regular rhythm and intact distal pulses. Exam reveals no gallop and no friction rub.  Murmur heard. Respiratory: Effort normal and breath sounds normal. No respiratory distress. She has no wheezes. She has no rales.  GI: Soft. Bowel sounds are normal. She exhibits no distension. There is no abdominal tenderness.  Musculoskeletal: Normal range of motion.        General: No edema.     Comments: No arthritis, no gout  Lymphadenopathy:    She has no cervical adenopathy.  Neurological:  She is alert and oriented to person, place, and time. No cranial nerve deficit.  No dysarthria, no aphasia  Skin:  Skin is warm and dry. No rash noted. No erythema.  Psychiatric: She has a normal mood and affect. Her behavior is normal. Judgment and thought content normal.    LABORATORY PANEL:   CBC Recent Labs  Lab 09/30/18 1720  WBC 4.4  HGB 10.1*  HCT 30.5*  PLT 170   ------------------------------------------------------------------------------------------------------------------  Chemistries  Recent Labs  Lab 09/30/18 1720  NA 138  K 4.2  CL 106  CO2 23  GLUCOSE 99  BUN 30*  CREATININE 1.72*  CALCIUM 9.5   ------------------------------------------------------------------------------------------------------------------  Cardiac Enzymes No results for input(s): TROPONINI in the last 168 hours. ------------------------------------------------------------------------------------------------------------------  RADIOLOGY:  Dg Chest 2 View  Result Date: 09/30/2018 CLINICAL DATA:  Referral from PCP with abnormal EKG, chest pain for 1 day and shortness of breath with ambulation EXAM: CHEST - 2 VIEW COMPARISON:  Radiograph and chest CT 09/07/2017 FINDINGS: No consolidation, features of edema, pneumothorax, or effusion. Pulmonary vascularity is normally distributed. The cardiomediastinal contours are unremarkable. No acute osseous or soft tissue abnormality. Degenerative changes are present in the imaged spine and shoulders. IMPRESSION: No acute cardiopulmonary abnormality. Electronically Signed   By: Lovena Le M.D.   On: 09/30/2018 17:40    EKG:   Orders placed or performed during the hospital encounter of 09/30/18  . EKG 12-Lead  . EKG 12-Lead  . ED EKG  . ED EKG    IMPRESSION AND PLAN:  Principal Problem:   Exertional chest pain -initial troponin negative, will trend cardiac enzymes, get echocardiogram and cardiology consult Active Problems:   Hypertension  -home dose antihypertensives   Reactive airway disease -continue home inhalers   Hypothyroidism -home dose thyroid replacement   Gastroesophageal reflux disease -home dose PPI   Mixed hyperlipidemia -home dose antilipid   CKD (chronic kidney disease), stage III ([place]) -at baseline, avoid nephrotoxins and monitor  Chart review performed and case discussed with ED provider. Labs, imaging and/or ECG reviewed by provider and discussed with patient/family. Management plans discussed with the patient and/or family.  COVID-19 status: Pending  DVT PROPHYLAXIS: SubQ heparin  GI PROPHYLAXIS:  PPI   ADMISSION STATUS: Observation  CODE STATUS: Full Code Status History    Date Active Date Inactive Code Status Order ID Comments User Context   01/20/2018 1219 01/23/2018 1502 Full Code [id number]  Dereck Leep, MD Inpatient   08/24/2017 2117 08/25/2017 2220 Full Code [id number]  Saundra Shelling, MD Inpatient   08/03/2015 1503 08/07/2015 1510 Full Code [id number]  [name], DPM Inpatient   Advance Care Planning Activity      TOTAL TIME TAKING CARE OF THIS PATIENT: 40 minutes.   This patient was evaluated in the context of the global COVID-19 pandemic, which necessitated consideration that the patient might be at risk for infection with the SARS-CoV-2 virus that causes COVID-19. Institutional protocols and algorithms that pertain to the evaluation of patients at risk for COVID-19 are in a state of rapid change based on information released by regulatory bodies including the CDC and federal and state organizations. These policies and algorithms were followed to the best of this provider's knowledge to date during the patient's care at this facility.  Ethlyn Daniels 09/30/2018, 9:34 PM  [facility]  Office  [phone]  CC: Primary care physician; Juline Patch, MD  Note:  This document was prepared using Dragon voice recognition software and may include unintentional dictation  errors.

## 2018-09-30 NOTE — ED Notes (Signed)
Patient asking about wait times to go back. This RN explained triage wait process, and let patient know her approximate place in line. Patient verbalized understanding.

## 2018-09-30 NOTE — Progress Notes (Signed)
Date:  09/30/2018   Name:  Michele Moore   DOB:  August 09, 1950   MRN:  [id number]   Chief Complaint: Chest Pain (Started after her shower today around 1PM.)  Chest Pain  This is a new problem. The current episode started today. The onset quality is sudden. The problem occurs intermittently. The problem has been unchanged. The pain is present in the substernal region (along costochondral margin). The pain is at a severity of 8/10. The pain is moderate. The quality of the pain is described as dull. The pain does not radiate. Associated symptoms include dizziness, exertional chest pressure, irregular heartbeat, palpitations and shortness of breath. Pertinent negatives include no abdominal pain, back pain, cough, diaphoresis, fever, headaches, leg pain, lower extremity edema, nausea, numbness, vomiting or weakness. She has tried nothing for the symptoms. Risk factors include being elderly, obesity and post-menopausal.    Review of Systems  Constitutional: Negative.  Negative for chills, diaphoresis, fatigue, fever and unexpected weight change.  HENT: Negative for congestion, ear discharge, ear pain, rhinorrhea, sinus pressure, sneezing and sore throat.   Eyes: Negative for photophobia, pain, discharge, redness and itching.  Respiratory: Positive for chest tightness and shortness of breath. Negative for cough, wheezing and stridor.   Cardiovascular: Positive for chest pain and palpitations. Negative for leg swelling.  Gastrointestinal: Negative for abdominal distention, abdominal pain, blood in stool, constipation, diarrhea, nausea and vomiting.  Endocrine: Negative for cold intolerance, heat intolerance, polydipsia, polyphagia and polyuria.  Genitourinary: Negative for dysuria, flank pain, frequency, hematuria, menstrual problem, pelvic pain, urgency, vaginal bleeding and vaginal discharge.  Musculoskeletal: Negative for arthralgias, back pain and myalgias.  Skin: Negative for rash.   Allergic/Immunologic: Negative for environmental allergies and food allergies.  Neurological: Positive for dizziness. Negative for weakness, light-headedness, numbness and headaches.  Hematological: Negative for adenopathy. Does not bruise/bleed easily.  Psychiatric/Behavioral: Negative for dysphoric mood. The patient is not nervous/anxious.     Patient Active Problem List   Diagnosis Date Noted  . Female pelvic pain 01/20/2018  . History of revision of total knee arthroplasty 01/20/2018  . Iron deficiency anemia   . Heme + stool   . Acute gastritis without hemorrhage   . Gastric polyps   . Benign neoplasm of ascending colon   . Benign neoplasm of transverse colon   . Polyp of sigmoid colon   . Hypertension 10/01/2017  . Hypothyroidism 10/01/2017  . Gastroesophageal reflux disease 10/01/2017  . Hypokalemia 10/01/2017  . Mixed hyperlipidemia 10/01/2017  . Reactive airway disease 10/01/2017  . Bradycardia 08/24/2017  . Severe obesity (BMI 35.0-35.9 with comorbidity) ([place]) 08/18/2017  . Chronic gouty arthropathy without tophi 06/25/2017  . Encounter for long-term (current) use of high-risk medication 06/25/2017  . Positive ANA (antinuclear antibody) 06/17/2017  . Cervical facet syndrome 06/16/2017  . Osteoarthritis of right shoulder due to rotator cuff injury 06/16/2017  . Sprain of anterior talofibular ligament of right ankle 06/16/2017  . Lumbar radiculopathy 04/21/2017  . Lumbar degenerative disc disease 04/21/2017  . Chronic pain syndrome 04/21/2017  . Spinal stenosis, lumbar region, with neurogenic claudication 04/21/2017  . SS-A antibody positive 03/05/2017  . Pain, lower extremity 08/03/2015  . DOE (dyspnea on exertion) 12/28/2013    Allergies  Allergen Reactions  . Ampicillin Swelling  . Penicillins Anaphylaxis and Swelling    Has patient had a PCN reaction causing immediate rash, facial/tongue/throat swelling, SOB or lightheadedness with hypotension: Yes Has  patient had a PCN reaction causing severe rash involving mucus  membranes or skin necrosis: No Has patient had a PCN reaction that required hospitalization: No Has patient had a PCN reaction occurring within the last 10 years: Yes If all of the above answers are "NO", then may proceed with Cephalosporin use.  . Vibramycin [Doxycycline Calcium] Rash    Past Surgical History:  Procedure Laterality Date  . ANKLE SURGERY    . CARPAL TUNNEL RELEASE     x3  . COLONOSCOPY  2000?  . COLONOSCOPY WITH PROPOFOL N/A 10/22/2017   Procedure: COLONOSCOPY WITH PROPOFOL;  Surgeon: Lucilla Lame, MD;  Location: [facility];  Service: Endoscopy;  Laterality: N/A;  . ESOPHAGOGASTRODUODENOSCOPY (EGD) WITH PROPOFOL N/A 10/22/2017   Procedure: ESOPHAGOGASTRODUODENOSCOPY (EGD) WITH PROPOFOL;  Surgeon: Lucilla Lame, MD;  Location: [facility];  Service: Endoscopy;  Laterality: N/A;  . FUSION OF TALONAVICULAR JOINT Right 08/03/2015   Procedure: TAILOR NAVICULAR JOINT FUSION - RIGHT ;  Surgeon: [name], DPM;  Location: [facility];  Service: Podiatry;  Laterality: Right;  . JOINT REPLACEMENT     knee x 3 ,right x1 and left x2  . POLYPECTOMY N/A 10/22/2017   Procedure: POLYPECTOMY;  Surgeon: Lucilla Lame, MD;  Location: [facility];  Service: Endoscopy;  Laterality: N/A;  . ROTATOR CUFF REPAIR    . TONSILLECTOMY    . TOTAL KNEE REVISION Right 01/20/2018   Procedure: POLYETHYLENE EXCHANGE;  Surgeon: Dereck Leep, MD;  Location: [facility];  Service: Orthopedics;  Laterality: Right;  . TUBAL LIGATION    . UPPER GI ENDOSCOPY  2000?    Social History   Tobacco Use  . Smoking status: Former Smoker    Quit date: 02/24/1993    Years since quitting: 25.6  . Smokeless tobacco: Never Used  Substance Use Topics  . Alcohol use: No    Alcohol/week: 0.0 standard drinks  . Drug use: No     Medication list has been reviewed and updated.  Current Meds  Medication Sig  . albuterol (PROVENTIL  HFA;VENTOLIN HFA) 108 (90 Base) MCG/ACT inhaler Inhale 2 puffs into the lungs every 4 (four) hours as needed for wheezing.  Marland Kitchen allopurinol (ZYLOPRIM) 100 MG tablet Take 2 tablets by mouth daily. Dr Barb Merino  . Cholecalciferol (VITAMIN D3) 2000 units TABS Take 2,000 Units by mouth daily.  . clotrimazole (LOTRIMIN) 1 % cream Apply to affected area 2 times daily  . diclofenac sodium (VOLTAREN) 1 % GEL Apply 2 g topically 3 (three) times daily. [facility]  . ferrous sulfate 325 (65 FE) MG tablet Take 325 mg by mouth daily with breakfast. otc  . fluticasone (FLONASE) 50 MCG/ACT nasal spray SHAKE LIQUID AND USE 1 SPRAY IN EACH NOSTRIL DAILY  . gabapentin (NEURONTIN) 600 MG tablet Take 1 tablet (600 mg total) by mouth at bedtime.  . hydrALAZINE (APRESOLINE) 50 MG tablet Take 1 tablet (50 mg total) by mouth 2 (two) times daily.  Marland Kitchen HYDROcodone-acetaminophen (NORCO) 7.5-325 MG tablet Take 1 tablet by mouth every 8 (eight) hours as needed for up to 30 days for severe pain. Must last 30 days.  Derrill Memo ON 10/02/2018] HYDROcodone-acetaminophen (NORCO) 7.5-325 MG tablet Take 1 tablet by mouth every 8 (eight) hours as needed for up to 30 days for severe pain. Must last 30 days.  Derrill Memo ON 11/01/2018] HYDROcodone-acetaminophen (NORCO) 7.5-325 MG tablet Take 1 tablet by mouth every 8 (eight) hours as needed for up to 30 days for severe pain. Must last 30 days.  Marland Kitchen levothyroxine (SYNTHROID) 200  MCG tablet TAKE 1 TABLET(200 MCG) BY MOUTH DAILY BEFORE BREAKFAST  . meclizine (ANTIVERT) 12.5 MG tablet Take 1 tablet (12.5 mg total) by mouth 2 (two) times daily as needed for dizziness.  . Multiple Vitamins-Calcium (ONE-A-DAY WOMENS FORMULA PO) Take 1 tablet by mouth daily.  Marland Kitchen omega-3 acid ethyl esters (LOVAZA) 1 g capsule Take 1 capsule (1 g total) by mouth 2 (two) times daily.  Marland Kitchen omeprazole (PRILOSEC) 40 MG capsule TAKE 1 CAPSULE(40 MG) BY MOUTH DAILY  . Polyethyl Glycol-Propyl Glycol (SYSTANE OP) Place 1 drop into  both eyes daily as needed (dry eyes).   . potassium chloride SA (K-DUR) 20 MEQ tablet Take 0.5 tablets (10 mEq total) by mouth daily.  Marland Kitchen Propylene Glycol (SYSTANE BALANCE OP) Apply 1 drop to eye 2 (two) times daily.  Marland Kitchen SYNTHROID 25 MCG tablet TAKE 1 TABLET BY MOUTH EVERY DAY BEFORE BREAKFAST. TAKE WITH 200 MCG TABLETS FOR TOTAL OF 225 MCG.  . tiZANidine (ZANAFLEX) 4 MG capsule Take 1 capsule (4 mg total) by mouth 2 (two) times daily as needed.  . triamcinolone cream (KENALOG) 0.1 % Apply 1 application topically every morning.  . triamterene-hydrochlorothiazide (MAXZIDE) 75-50 MG tablet Take 1 tablet by mouth daily.    PHQ 2/9 Scores 07/14/2018 03/30/2018 03/01/2018 12/29/2017  PHQ - 2 Score 0 0 0 0  PHQ- 9 Score 0 - - -    BP Readings from Last 3 Encounters:  09/30/18 128/64  09/06/18 121/84  08/23/18 131/74    Physical Exam Vitals signs and nursing note reviewed.  Constitutional:      General: She is not in acute distress.    Appearance: She is obese. She is not diaphoretic.  HENT:     Head: Normocephalic and atraumatic.     Right Ear: External ear normal.     Left Ear: External ear normal.     Nose: Nose normal.  Eyes:     General:        Right eye: No discharge.        Left eye: No discharge.     Conjunctiva/sclera: Conjunctivae normal.     Pupils: Pupils are equal, round, and reactive to light.  Neck:     Musculoskeletal: Normal range of motion and neck supple.     Thyroid: No thyromegaly.     Vascular: No hepatojugular reflux or JVD.  Cardiovascular:     Rate and Rhythm: Normal rate. Rhythm irregular.     Heart sounds: Normal heart sounds. No murmur. No systolic murmur. No diastolic murmur. No friction rub. No gallop. No S3 or S4 sounds.   Pulmonary:     Effort: Pulmonary effort is normal.     Breath sounds: Normal breath sounds. No decreased breath sounds, wheezing, rhonchi or rales.  Chest:     Chest wall: Tenderness present. No edema.  Abdominal:     General: Bowel  sounds are normal.     Palpations: Abdomen is soft. There is no hepatomegaly, splenomegaly or mass.     Tenderness: There is no abdominal tenderness. There is no guarding.  Musculoskeletal: Normal range of motion.     Right lower leg: No edema.  Lymphadenopathy:     Cervical: No cervical adenopathy.  Skin:    General: Skin is warm and dry.  Neurological:     Mental Status: She is alert.     Deep Tendon Reflexes: Reflexes are normal and symmetric.     Wt Readings from Last 3 Encounters:  09/30/18 260  lb (117.9 kg)  09/06/18 260 lb (117.9 kg)  08/23/18 254 lb (115.2 kg)    BP 128/64   Pulse 87   Ht 5\' 11"  (1.803 m)   Wt 260 lb (117.9 kg)   SpO2 98%   BMI 36.26 kg/m   Assessment and Plan: We are referring you to the emergency room due to your exertional chest pain, dizziness, dyspnea on exertion, and EKG with frequent multiform ectopic ventricular beats.  Patient noted this morning was doing well but started having discomfort in her chest which is nonradiating and associated with the above symptoms.  Upon arrival to our clinic for routine lab work patient says that she is developed chest pain in the midsternal area and felt dizzy as if she was going to pass out.  She is also felt a "jerking in her chest "consistent with palpitations and with noted multifocal PVCs.   1. Chest pain, unspecified type New onset.  Persistent.  Relieved with rest.  Exacerbated by exertion.  Will obtain EKG which notes multifocal PVCs.I have reviewed EKG which shows normal rate, abnormal rhythm with multifocal PVCs, no ST, T wave, R wave progression abnormalities.. Comparison to previous EKG dated 2019 was noted to have a bradycardia dysrhythmia. - EKG 12-Lead  2. Dizzinesses Patient's been having dizziness to the point of near syncope.  Multifocal PVCs have not occurred in couplets  3. Multifocal PVCs Patient was noted to have an abnormal EKG and with comparison to previous EKGs we have not noticed  this number of PVCs.  A bigeminal pattern was noted on admission in July 2019.  4. DOE (dyspnea on exertion) Patient has attributed her dyspnea due to the heat there is been increasing dyspnea on exertion which is troublesome with the association with substernal chest pain.  5. Mixed hyperlipidemia Noted risk factor presently on Lovaza.  6. Hypertension, unspecified type Noted risk factor presently on hydralazine and triamterene hydrochlorothiazide.  7. Chest pain, exertional exertional chest pain as noted above.  Was discussed with patient and decision is been made to refer patient to the emergency room for further evaluation given her multiple risk factors and exertional chest pain with dizziness shortness of breath and abnormal EKG.

## 2018-09-30 NOTE — ED Notes (Signed)
Pt up to bedside toilet. Will collect 2nd trop once back in bed.

## 2018-09-30 NOTE — ED Notes (Signed)
Pt resting comfortably in bed. Rail up. Bed locked low. Call bell within reach.

## 2018-10-01 ENCOUNTER — Observation Stay (HOSPITAL_BASED_OUTPATIENT_CLINIC_OR_DEPARTMENT_OTHER): Payer: Medicare Other

## 2018-10-01 ENCOUNTER — Observation Stay (HOSPITAL_BASED_OUTPATIENT_CLINIC_OR_DEPARTMENT_OTHER)
Admit: 2018-10-01 | Discharge: 2018-10-01 | Disposition: A | Discharge status: Home / Self Care | Payer: Medicare Other | Attending: Internal Medicine | Admitting: Internal Medicine

## 2018-10-01 DIAGNOSIS — R0789 Other chest pain: Secondary | ICD-10-CM

## 2018-10-01 DIAGNOSIS — E039 Hypothyroidism, unspecified: Secondary | ICD-10-CM | POA: Diagnosis not present

## 2018-10-01 DIAGNOSIS — E785 Hyperlipidemia, unspecified: Secondary | ICD-10-CM

## 2018-10-01 DIAGNOSIS — I10 Essential (primary) hypertension: Secondary | ICD-10-CM | POA: Diagnosis not present

## 2018-10-01 DIAGNOSIS — J45909 Unspecified asthma, uncomplicated: Secondary | ICD-10-CM | POA: Diagnosis not present

## 2018-10-01 DIAGNOSIS — R079 Chest pain, unspecified: Secondary | ICD-10-CM | POA: Diagnosis not present

## 2018-10-01 DIAGNOSIS — R0609 Other forms of dyspnea: Secondary | ICD-10-CM

## 2018-10-01 DIAGNOSIS — G894 Chronic pain syndrome: Secondary | ICD-10-CM

## 2018-10-01 LAB — HEPATIC FUNCTION PANEL
ALT: 134 U/L — ABNORMAL HIGH (ref 0–44)
AST: 70 U/L — ABNORMAL HIGH (ref 15–41)
Albumin: 3.7 g/dL (ref 3.5–5.0)
Alkaline Phosphatase: 66 U/L (ref 38–126)
Bilirubin, Direct: <0.1 mg/dL (ref 0.0–0.2)
Total Bilirubin: 0.6 mg/dL (ref 0.3–1.2)
Total Protein: 6.5 g/dL (ref 6.5–8.1)

## 2018-10-01 LAB — NM MYOCAR MULTI W/SPECT W/WALL MOTION / EF
Estimated workload: 1 METS
Exercise duration (min): 0 min
Exercise duration (sec): 0 s
LV dias vol: 125 mL (ref 46–106)
LV sys vol: 55 mL
MPHR: 151 {beats}/min
Peak HR: 79 {beats}/min
Percent HR: 52 %
Rest HR: 54 {beats}/min
SDS: 2
SRS: 4
SSS: 1
TID: 0.95

## 2018-10-01 LAB — CBC
HCT: 31.5 % — ABNORMAL LOW (ref 36.0–46.0)
Hemoglobin: 10.2 g/dL — ABNORMAL LOW (ref 12.0–15.0)
MCH: 30.6 pg (ref 26.0–34.0)
MCHC: 32.4 g/dL (ref 30.0–36.0)
MCV: 94.6 fL (ref 80.0–100.0)
Platelets: 167 10*3/uL (ref 150–400)
RBC: 3.33 MIL/uL — ABNORMAL LOW (ref 3.87–5.11)
RDW: 14.4 % (ref 11.5–15.5)
WBC: 3.5 10*3/uL — ABNORMAL LOW (ref 4.0–10.5)
nRBC: 0 % (ref 0.0–0.2)

## 2018-10-01 LAB — BASIC METABOLIC PANEL
Anion gap: 8 (ref 5–15)
BUN: 26 mg/dL — ABNORMAL HIGH (ref 8–23)
CO2: 25 mmol/L (ref 22–32)
Calcium: 9.2 mg/dL (ref 8.9–10.3)
Chloride: 107 mmol/L (ref 98–111)
Creatinine, Ser: 1.46 mg/dL — ABNORMAL HIGH (ref 0.44–1.00)
GFR calc Af Amer: 42 mL/min — ABNORMAL LOW (ref >60)
GFR calc non Af Amer: 36 mL/min — ABNORMAL LOW (ref >60)
Glucose, Bld: 87 mg/dL (ref 70–99)
Potassium: 4.2 mmol/L (ref 3.5–5.1)
Sodium: 140 mmol/L (ref 135–145)

## 2018-10-01 LAB — LIPID PANEL
Cholesterol: 181 mg/dL (ref 0–200)
HDL: 43 mg/dL (ref >40)
LDL Cholesterol: 111 mg/dL — ABNORMAL HIGH (ref 0–99)
Total CHOL/HDL Ratio: 4.2 RATIO
Triglycerides: 133 mg/dL (ref <150)
VLDL: 27 mg/dL (ref 0–40)

## 2018-10-01 LAB — HEMOGLOBIN A1C
Hgb A1c MFr Bld: 5.4 % (ref 4.8–5.6)
Mean Plasma Glucose: 108.28 mg/dL

## 2018-10-01 LAB — MAGNESIUM: Magnesium: 2 mg/dL (ref 1.7–2.4)

## 2018-10-01 LAB — SARS CORONAVIRUS 2 (TAT 6-24 HRS): SARS Coronavirus 2: NEGATIVE

## 2018-10-01 LAB — TSH: TSH: 0.196 u[IU]/mL — ABNORMAL LOW (ref 0.350–4.500)

## 2018-10-01 MED ORDER — TECHNETIUM TC 99M TETROFOSMIN IV KIT
10.0000 | PACK | Freq: Once | INTRAVENOUS | Status: AC | PRN
  Administered 2018-10-01: 11.011 via INTRAVENOUS

## 2018-10-01 MED ORDER — METHOTREXATE 2.5 MG PO TABS
20.0000 mg | ORAL_TABLET | ORAL | Status: DC
  Administered 2018-10-01: 20 mg via ORAL
  Filled 2018-10-01: qty 8

## 2018-10-01 MED ORDER — LEVOTHYROXINE SODIUM 25 MCG PO TABS
25.0000 ug | ORAL_TABLET | Freq: Every day | ORAL | Status: DC
  Administered 2018-10-01 – 2018-10-02 (×2): 25 ug via ORAL
  Filled 2018-10-01 (×2): qty 1

## 2018-10-01 MED ORDER — LEVOTHYROXINE SODIUM 100 MCG PO TABS
200.0000 ug | ORAL_TABLET | Freq: Every day | ORAL | Status: DC
  Administered 2018-10-01 – 2018-10-02 (×2): 200 ug via ORAL
  Filled 2018-10-01 (×2): qty 2

## 2018-10-01 MED ORDER — TECHNETIUM TC 99M TETROFOSMIN IV KIT
30.0000 | PACK | Freq: Once | INTRAVENOUS | Status: AC | PRN
  Administered 2018-10-01: 29.093 via INTRAVENOUS

## 2018-10-01 MED ORDER — ENOXAPARIN SODIUM 40 MG/0.4ML ~~LOC~~ SOLN
40.0000 mg | SUBCUTANEOUS | Status: DC
  Administered 2018-10-01: 40 mg via SUBCUTANEOUS
  Filled 2018-10-01: qty 0.4

## 2018-10-01 MED ORDER — METOPROLOL TARTRATE 25 MG PO TABS
25.0000 mg | ORAL_TABLET | Freq: Two times a day (BID) | ORAL | Status: DC
  Administered 2018-10-01 – 2018-10-02 (×2): 25 mg via ORAL
  Filled 2018-10-01 (×3): qty 1

## 2018-10-01 MED ORDER — REGADENOSON 0.4 MG/5ML IV SOLN
0.4000 mg | Freq: Once | INTRAVENOUS | Status: AC
  Administered 2018-10-01: 0.4 mg via INTRAVENOUS
  Filled 2018-10-01: qty 5

## 2018-10-01 MED ORDER — PERFLUTREN LIPID MICROSPHERE
1.0000 mL | INTRAVENOUS | Status: AC | PRN
  Administered 2018-10-01: 5 mL via INTRAVENOUS
  Filled 2018-10-01: qty 10

## 2018-10-01 NOTE — Progress Notes (Signed)
PHARMACIST - PHYSICIAN COMMUNICATION  CONCERNING:  Heparin for DVT Prophylaxis    RECOMMENDATION: Patient was prescribed Heparin 5000u  Q8hours for VTE prophylaxis.   Filed Weights   09/30/18 2355 10/01/18 0511  Weight: 263 lb 4.8 oz (119.4 kg) 262 lb 8 oz (119.1 kg)    Body mass index is 36.61 kg/m.  Estimated Creatinine Clearance: 51.7 mL/min (A) (by C-G formula based on SCr of 1.46 mg/dL (H)).   Based on [place] patient is candidate for enoxaparin 40mg  every 24 hours dosing due to BMI being <40 and CrCl > 30 mL/min.   DESCRIPTION: Pharmacy has adjusted enoxaparin dose per [facility] policy.  Patient is now receiving enoxaparin 40mg  every 24 hours.  Rowland Lathe, PharmD Clinical Pharmacist  10/01/2018 12:31 PM

## 2018-10-01 NOTE — Care Management Obs Status (Signed)
[facility] NOTIFICATION   Patient Details  Name: Michele Moore MRN: [id number] Date of Birth: 08/10/1950   Medicare Observation Status Notification Given:  Yes    Abdulai Blaylock, Lenice Llamas 10/01/2018, 4:51 PM

## 2018-10-01 NOTE — Consult Note (Signed)
Cardiology Consultation:   Patient ID: LUJAIN Moore MRN: [id number]; DOB: Mar 16, 1950  Admit date: 09/30/2018 Date of Consult: 10/01/2018  Primary Care Provider: Juline Patch, MD Primary Cardiologist: [facility], Dr. Fletcher Anon rounding Primary Electrophysiologist:  None    Patient Profile:   Michele Moore is a 68 y.o. female with a hx of HTN, HLD, PVCs, hypothyroidism, vertigo, asthma, arthritis, anemia, collagen vascular disease, CKDIII, GERD with history of multiple gastric ulcers, and who is being seen today for the evaluation of atypical chest pain at the request of Dr. Jannifer Franklin.  History of Present Illness:   Michele Moore is a sedentary 68 yo female on disability since approximately the age of 18 and with self reported sedentary lifestyle ("I just watch TV all day and never really walk around") due to chronic and ongoing orthopedic issues and reported surgeries, as well as medical history as above. She has a history of HTN and HLD but otherwise no known history of cardiac disease to date.  Of note, she reported exposure to recent illness. Her daughter is currently febrile, SOB, and a cough. She stated that her daughter had not improved with cold medicine. Patient also reported an occasional new cough and recent low grade fever as well as current feeling of both fevers and chills. Initial COVID-19 negative. Denied exposure to COVID-19.   She reported that yesterday 8/6 she was getting out of her vehicle when she felt central chest pain that radiated to her left shoulder and through to her back and has been constant/ ongoing (still present throughout today's exam on 8/7). She characterized the pain as 10/10 in severity, pleuritic, TTP, and worse with talking and exertion in addition to deep breathing. She reported that the pain felt only mildly improved with pain medication. She has never had CP like this in the past.  She expressed her belief that the chest pain may be due to heat or dehydration.  Associated symptoms included SOB, which she stated may be in part due to her asthma. She reported nausea due to GERD and not related to CP. She also reported intermittent feelings of racing HR and palpitations. She also noted ongoing / chronic orthopnea, SOB/DOE (unable to walk in grocery store without resting), and asymmetrical LEE and pain (which she feels is due to her ongoing knee, hip, and ankle issues).  As above, she also reported fevers and chills, as well as recent low-grade fever.  She reported that she felt dizzy; however, she attributed this to her vertigo.  She also reported that she had a fall; however, this fall occurred sometime ago (unable to pin down specific time).   Per patient, she presented to [facility] ED 8/6 after her PCP noted tachycardic rate and ectopy at her visit; therefore, her daughter drove her to [facility]. Labs showed negative troponin. EKG with frequent ectopy and poor conduction through leads as below but without acute changes. CXR without acute changes.  Heart Pathway Score:     Past Medical History:  Diagnosis Date  . Allergy   . Anemia   . Arthritis   . Asthma   . Chronic kidney disease    STAGE 3 PER DR EASON 08/02/15  . Collagen vascular disease ([place])   . Family history of adverse reaction to anesthesia    sister difficult to put to sleep  . GERD (gastroesophageal reflux disease)   . H/O tooth extraction    all lower teeth 1/19  . Hemorrhoid   . History of  hiatal hernia   . Hypertension   . Hypothyroidism   . Migraines   . Mixed hyperlipidemia   . Multiple gastric ulcers   . Thyroid disease   . Wears dentures    partial upper and lower    Past Surgical History:  Procedure Laterality Date  . ANKLE SURGERY    . CARPAL TUNNEL RELEASE     x3  . COLONOSCOPY  2000?  . COLONOSCOPY WITH PROPOFOL N/A 10/22/2017   Procedure: COLONOSCOPY WITH PROPOFOL;  Surgeon: Lucilla Lame, MD;  Location: [place];  Service: Endoscopy;  Laterality: N/A;  .  ESOPHAGOGASTRODUODENOSCOPY (EGD) WITH PROPOFOL N/A 10/22/2017   Procedure: ESOPHAGOGASTRODUODENOSCOPY (EGD) WITH PROPOFOL;  Surgeon: Lucilla Lame, MD;  Location: [place];  Service: Endoscopy;  Laterality: N/A;  . FUSION OF TALONAVICULAR JOINT Right 08/03/2015   Procedure: TAILOR NAVICULAR JOINT FUSION - RIGHT ;  Surgeon: [name], DPM;  Location: [facility];  Service: Podiatry;  Laterality: Right;  . JOINT REPLACEMENT     knee x 3 ,right x1 and left x2  . POLYPECTOMY N/A 10/22/2017   Procedure: POLYPECTOMY;  Surgeon: Lucilla Lame, MD;  Location: [place];  Service: Endoscopy;  Laterality: N/A;  . ROTATOR CUFF REPAIR    . TONSILLECTOMY    . TOTAL KNEE REVISION Right 01/20/2018   Procedure: POLYETHYLENE EXCHANGE;  Surgeon: Dereck Leep, MD;  Location: [facility];  Service: Orthopedics;  Laterality: Right;  . TUBAL LIGATION    . UPPER GI ENDOSCOPY  2000?     Home Medications:  Prior to Admission medications   Medication Sig Start Date End Date Taking? Authorizing Provider  albuterol (PROVENTIL HFA;VENTOLIN HFA) 108 (90 Base) MCG/ACT inhaler Inhale 2 puffs into the lungs every 4 (four) hours as needed for wheezing. 10/01/17  Yes Juline Patch, MD  allopurinol (ZYLOPRIM) 100 MG tablet Take 200 mg by mouth daily.  08/06/17  Yes [provider]  Cholecalciferol (VITAMIN D3) 2000 units TABS Take 2,000 Units by mouth daily.   Yes [provider]  clotrimazole (LOTRIMIN) 1 % cream Apply to affected area 2 times daily Patient taking differently: Apply 1 application topically 2 (two) times daily.  04/05/18  Yes Cook, Jayce G, DO  diclofenac sodium (VOLTAREN) 1 % GEL Apply 2 g topically 3 (three) times daily.  08/11/17  Yes [provider]  ferrous sulfate 325 (65 FE) MG tablet Take 325 mg by mouth daily with breakfast.    Yes [provider]  fluticasone (FLONASE) 50 MCG/ACT nasal spray SHAKE LIQUID AND USE 1 SPRAY IN EACH NOSTRIL DAILY Patient  taking differently: Place 1 spray into both nostrils daily.  08/23/18  Yes Juline Patch, MD  gabapentin (NEURONTIN) 600 MG tablet Take 1 tablet (600 mg total) by mouth at bedtime. 08/19/18 11/17/18 Yes Gillis Santa, MD  hydrALAZINE (APRESOLINE) 50 MG tablet Take 1 tablet (50 mg total) by mouth 2 (two) times daily. 07/14/18  Yes Juline Patch, MD  HYDROcodone-acetaminophen (NORCO) 7.5-325 MG tablet Take 1 tablet by mouth every 8 (eight) hours as needed for up to 30 days for severe pain. Must last 30 days. 09/02/18 10/02/18 Yes Gillis Santa, MD  levothyroxine (SYNTHROID) 200 MCG tablet TAKE 1 TABLET(200 MCG) BY MOUTH DAILY BEFORE BREAKFAST Patient taking differently: Take 200 mcg by mouth daily before breakfast. Take with 25 mcg tablet for a total of 225 mcg dose 07/14/18  Yes Juline Patch, MD  losartan (COZAAR) 25 MG tablet Take 25  mg by mouth daily.   Yes [provider]  meclizine (ANTIVERT) 12.5 MG tablet Take 1 tablet (12.5 mg total) by mouth 2 (two) times daily as needed for dizziness. 09/14/17  Yes Juline Patch, MD  methotrexate (RHEUMATREX) 2.5 MG tablet Take 20 mg by mouth every Friday. 09/29/18  Yes [provider]  Multiple Vitamins-Calcium (ONE-A-DAY WOMENS FORMULA PO) Take 1 tablet by mouth daily.   Yes [provider]  omega-3 acid ethyl esters (LOVAZA) 1 g capsule Take 1 capsule (1 g total) by mouth 2 (two) times daily. 07/14/18  Yes Juline Patch, MD  omeprazole (PRILOSEC) 40 MG capsule TAKE 1 CAPSULE(40 MG) BY MOUTH DAILY Patient taking differently: Take 40 mg by mouth daily.  07/14/18  Yes Juline Patch, MD  Polyethyl Glycol-Propyl Glycol (SYSTANE OP) Place 1 drop into both eyes daily as needed (dry eyes).    Yes [provider]  potassium chloride SA (K-DUR) 20 MEQ tablet Take 0.5 tablets (10 mEq total) by mouth daily. 07/14/18  Yes Juline Patch, MD  Propylene Glycol (SYSTANE BALANCE OP) Apply 1 drop to eye 2 (two) times daily.   Yes [provider]  SYNTHROID 25 MCG tablet TAKE 1 TABLET BY MOUTH EVERY DAY BEFORE BREAKFAST. TAKE WITH 200 MCG TABLETS FOR TOTAL OF 225 MCG. Patient taking differently: Take 25 mcg by mouth daily before breakfast. Take with 200 mcg tablet for a total of 225 mcg dose 09/06/18  Yes Juline Patch, MD  triamcinolone cream (KENALOG) 0.1 % Apply 1 application topically every morning.   Yes [provider]  triamterene-hydrochlorothiazide (MAXZIDE) 75-50 MG tablet Take 1 tablet by mouth daily. 07/14/18  Yes Juline Patch, MD  HYDROcodone-acetaminophen (NORCO) 7.5-325 MG tablet Take 1 tablet by mouth every 8 (eight) hours as needed for up to 30 days for severe pain. Must last 30 days. 10/02/18 11/01/18  Gillis Santa, MD  HYDROcodone-acetaminophen (NORCO) 7.5-325 MG tablet Take 1 tablet by mouth every 8 (eight) hours as needed for up to 30 days for severe pain. Must last 30 days. 11/01/18 12/01/18  Gillis Santa, MD  tiZANidine (ZANAFLEX) 4 MG tablet Take 4 mg by mouth 2 (two) times daily. 09/27/18   [provider]    Inpatient Medications: Scheduled Meds: . atorvastatin  40 mg Oral q1800  . gabapentin  600 mg Oral QHS  . heparin  5,000 Units Subcutaneous Q8H  . levothyroxine  200 mcg Oral Q0600   And  . levothyroxine  25 mcg Oral Q0600  . methotrexate  20 mg Oral Q Fri  . metoprolol tartrate  25 mg Oral BID  . pantoprazole  40 mg Oral Daily   Continuous Infusions:  PRN Meds: acetaminophen **OR** acetaminophen, ondansetron **OR** ondansetron (ZOFRAN) IV, oxyCODONE  Allergies:    Allergies  Allergen Reactions  . Ampicillin Swelling  . Penicillins Anaphylaxis and Swelling    Has patient had a PCN reaction causing immediate rash, facial/tongue/throat swelling, SOB or lightheadedness with hypotension: Yes Has patient had a PCN reaction causing severe rash involving mucus membranes or skin necrosis: No Has patient had a PCN reaction that required hospitalization: No Has patient had a  PCN reaction occurring within the last 10 years: Yes If all of the above answers are "NO", then may proceed with Cephalosporin use.  . Vibramycin [Doxycycline Calcium] Rash    Social History:   Social History   Socioeconomic History  . Marital status: Divorced    Spouse name:  Not on file  . Number of children: 4  . Years of education: Not on file  . Highest education level: 9th grade  Occupational History  . Occupation: retired  Scientific laboratory technician  . Financial resource strain: Not very hard  . Food insecurity    Worry: Never true    Inability: Never true  . Transportation needs    Medical: No    Non-medical: No  Tobacco Use  . Smoking status: Former Smoker    Quit date: 02/24/1993    Years since quitting: 25.6  . Smokeless tobacco: Never Used  Substance and Sexual Activity  . Alcohol use: No    Alcohol/week: 0.0 standard drinks  . Drug use: No  . Sexual activity: Not Currently  Lifestyle  . Physical activity    Days per week: 0 days    Minutes per session: 0 min  . Stress: To some extent  Relationships  . Social connections    Talks on phone: More than three times a week    Gets together: More than three times a week    Attends religious service: More than 4 times per year    Active member of club or organization: No    Attends meetings of clubs or organizations: Never    Relationship status: Divorced  . Intimate partner violence    Fear of current or ex partner: No    Emotionally abused: No    Physically abused: No    Forced sexual activity: No  Other Topics Concern  . Not on file  Social History Narrative  . Not on file    Family History:    Family History  Problem Relation Age of Onset  . COPD Sister   . Cancer Maternal Aunt        breast  . Cancer Maternal Uncle        kidney     ROS:  Please see the history of present illness.  Review of Systems  Constitutional: Positive for chills, diaphoresis, fever and malaise/fatigue.  HENT: Positive for  tinnitus.   Respiratory: Positive for cough, shortness of breath and wheezing. Negative for hemoptysis.   Cardiovascular: Positive for chest pain, palpitations, orthopnea, claudication and leg swelling.       Atypical, ttp, pleuritic  Gastrointestinal: Positive for abdominal pain, heartburn and nausea. Negative for blood in stool, melena and vomiting.       GERD  Musculoskeletal: Positive for back pain, joint pain, myalgias and neck pain. Negative for falls.       [address] "a very long time ago"  Neurological: Positive for dizziness and weakness. Negative for loss of consciousness.       Vertigo, ongoing issue    All other ROS reviewed and negative.     Physical Exam/Data:   Vitals:   10/01/18 0511 10/01/18 0512 10/01/18 0802 10/01/18 1003  BP:  106/66 139/61   Pulse:  (!) 56 (!) 54 73  Resp:  20 19   Temp:  97.8 F (36.6 C) 97.8 F (36.6 C)   TempSrc:  Oral Oral   SpO2:  98% 100%   Weight: 119.1 kg       Intake/Output Summary (Last 24 hours) at 10/01/2018 1050 Last data filed at 10/01/2018 1020 Gross per 24 hour  Intake -  Output 1000 ml  Net -1000 ml   Last 3 Weights 10/01/2018 09/30/2018 09/30/2018  Weight (lbs) 262 lb 8 oz 263 lb 4.8 oz 260 lb  Weight (kg) 119.069 kg 119.432  kg 117.935 kg     Body mass index is 36.61 kg/m.  General: Obese female in NAD HEENT: normal Neck: JVD difficult to assess d/t body habitus Vascular: radial pulses 2+ bilaterally Cardiac:  Distant heart sounds, normal S1, S2; RRR; no murmur  Lungs:  Distant breath sounds, no wheezing, rhonchi or rales  Abd: soft, nontender Ext: asymmetric nonpitting edema without erythema. Musculoskeletal:  No deformities Skin: warm and dry  Neuro:  No focal abnormalities noted Psych:  Normal affect   EKG:  The EKG was personally reviewed and demonstrates:  Initial EKG showed sinus rhythm with frequent ectopy including PACs and PVCs, IVCD. Telemetry:  Telemetry was personally reviewed and demonstrates:  SR/ST/SB,  ectopy  Relevant CV Studies: Echo pending  NM pending  Laboratory Data:  High Sensitivity Troponin:   Recent Labs  Lab 09/30/18 1720 09/30/18 2108  TROPONINIHS 9 9     Cardiac EnzymesNo results for input(s): TROPONINI in the last 168 hours. No results for input(s): TROPIPOC in the last 168 hours.  Chemistry Recent Labs  Lab 09/30/18 1720 10/01/18 0655  NA 138 140  K 4.2 4.2  CL 106 107  CO2 23 25  GLUCOSE 99 87  BUN 30* 26*  CREATININE 1.72* 1.46*  CALCIUM 9.5 9.2  GFRNONAA 30* 36*  GFRAA 35* 42*  ANIONGAP 9 8    No results for input(s): PROT, ALBUMIN, AST, ALT, ALKPHOS, BILITOT in the last 168 hours. Hematology Recent Labs  Lab 09/30/18 1720 10/01/18 0655  WBC 4.4 3.5*  RBC 3.27* 3.33*  HGB 10.1* 10.2*  HCT 30.5* 31.5*  MCV 93.3 94.6  MCH 30.9 30.6  MCHC 33.1 32.4  RDW 14.3 14.4  PLT 170 167   BNPNo results for input(s): BNP, PROBNP in the last 168 hours.  DDimer No results for input(s): DDIMER in the last 168 hours.   Radiology/Studies:  Dg Chest 2 View  Result Date: 09/30/2018 CLINICAL DATA:  Referral from PCP with abnormal EKG, chest pain for 1 day and shortness of breath with ambulation EXAM: CHEST - 2 VIEW COMPARISON:  Radiograph and chest CT 09/07/2017 FINDINGS: No consolidation, features of edema, pneumothorax, or effusion. Pulmonary vascularity is normally distributed. The cardiomediastinal contours are unremarkable. No acute osseous or soft tissue abnormality. Degenerative changes are present in the imaged spine and shoulders. IMPRESSION: No acute cardiopulmonary abnormality. Electronically Signed   By: Lovena Le M.D.   On: 09/30/2018 17:40    Assessment and Plan:   Atypical CP, negative troponin  --Current and atypical CP. Suspect CP non-cardiac given its atypical nature of constant pain that is TTP and started with racing HR and ectopy.  --Cannot rule out cardiac etiology due to multiple risk factors for cardiac etiology including morbid  obesity, sedentary lifestyle, HTN, and HLD; therefore, will schedule for stress today for further risk stratification.  --EKG with nonspecific changes and frequent ectopy. Hs Tn negative. Lower suspicion for ischemic etiology at this time and pending echo and stress test. Consider CP in the setting of rapid HR and ectopy. Also considered is PE with 7/15 (previous) CTA negative for PE. Asymmetric LEE/pain reported as chronic. Patient hemodynamically stable with back pain improving, which is not consistent with aortic dissection. However, if cardiac ischemic workup negative, and pain persists despite improved rate and ectopy, could consider repeat CTA per IM. --Further recommendations pending stress test and echo. No current plan for cardiac catheterization unless stress test ruled high risk or acute findings on echo. Continue ASA at  81mg , low dose BB, and statin. SL nitro as needed. Recommend outpatient follow-up.  Ectopy, Tachycardia --Check TSH / thyroid panel given 06/2018 TSH 7.450 with known hypothyroid on Synthroid. If still elevated, adjust synthroid accordingly with close follow-up per PCP/endocrinology. Abnormal TSH could contribute to rate / ectopy. --Daily BMET to monitor electrolytes with current K 4.2. PTA KCl 38mEq daily noted. Will check Mg as electrolyte abnormalities can contribute to ectopy --BB started for rate control, anti-anginal effect, ectopy. Titrate as tolerated with monitoring of HR and BP. --Could consider outpatient Zio to further assess ectopy and to assist with medical management.  HTN --Improved yet still sub-optimal. PTA hydralazine, losartan (currently held). CKDIII on ARB and consider stopping ARB if EF normal on echo given Cr. Continue BB as needed for additional BP support.  Additional medication recommendations pending echo.  CKD3 --Daily BMET. Appears that at baseline Cr 1.5 to 1.8 --Caution with contrast and nephrotoxins. Caution with PTA ARB given CKD3.   --Consider transition to CCB for HTN, pending assessment of EF with echo as above.  Anemia, likely of chronic disease --Hgb 10.2, which appears to be her baseline.  --Transfusion recommended for Hgb below 8.0.  HLD --Continue statin therapy, started this admission. Last LDL 113 in 06/2018. --Baseline lipid and liver function ordered, given statin started this admission.  --F/u labs in 6-8w for lipid and liver function.  For questions or updates, please contact Susan Moore Please consult [email or web address] for contact info under     Signed, Arvil Chaco, PA-C  10/01/2018 10:50 AM

## 2018-10-01 NOTE — Plan of Care (Signed)
  Problem: Education: Goal: Knowledge of General Education information will improve Description: Including pain rating scale, medication(s)/side effects and non-pharmacologic comfort measures Outcome: Progressing Note: Patient admitted at 2330. Patient complaining of 7/10 chest pain. Patient profile completed.

## 2018-10-01 NOTE — Progress Notes (Signed)
Spoke with Leana Roe, daughter and updated.

## 2018-10-01 NOTE — Plan of Care (Signed)
  Problem: Education: Goal: Knowledge of General Education information will improve Description: Including pain rating scale, medication(s)/side effects and non-pharmacologic comfort measures Outcome: Progressing   Problem: Health Behavior/Discharge Planning: Goal: Ability to manage health-related needs will improve Outcome: Progressing   Problem: Clinical Measurements: Goal: Respiratory complications will improve Outcome: Progressing Goal: Cardiovascular complication will be avoided Outcome: Progressing   Problem: Activity: Goal: Ability to tolerate increased activity will improve Outcome: Progressing   Problem: Cardiac: Goal: Ability to achieve and maintain adequate cardiovascular perfusion will improve Outcome: Progressing

## 2018-10-01 NOTE — Progress Notes (Signed)
Patient follows up with pain management clinic, can follow with them regarding narcotic medicines at discharge. Patient daughter Olivia Mackie is coming to hospital, I spoke with her on the phone.

## 2018-10-01 NOTE — Progress Notes (Addendum)
[name] [place] NAME: Michele Moore    MR#:  [id number]  DATE OF BIRTH:  04-09-1950  SUBJECTIVE: Admitted for chest pain, sent from PCP office for PVCs, chest pain with dizziness.  Patient this morning has pleuritic type of chest pain but no shortness of breath.  Patient high-sensitivity troponins are 9.  CHIEF COMPLAINT:   Chief Complaint  Patient presents with  . Chest Pain    REVIEW OF SYSTEMS:   ROS CONSTITUTIONAL: No fever, fatigue or weakness.  EYES: No blurred or double vision.  EARS, NOSE, AND THROAT: No tinnitus or ear pain.  RESPIRATORY: No cough, shortness of breath, wheezing or hemoptysis.  CARDIOVASCULAR: N chest pain more on palpation.  GASTROINTESTINAL: No nausea, vomiting, diarrhea or abdominal pain.  GENITOURINARY: No dysuria, hematuria.  ENDOCRINE: No polyuria, nocturia,  HEMATOLOGY: No anemia, easy bruising or bleeding SKIN: No rash or lesion. MUSCULOSKELETAL: No joint pain or arthritis.   NEUROLOGIC: No tingling, numbness, weakness.  PSYCHIATRY: No anxiety or depression.   DRUG ALLERGIES:   Allergies  Allergen Reactions  . Ampicillin Swelling  . Penicillins Anaphylaxis and Swelling    Has patient had a PCN reaction causing immediate rash, facial/tongue/throat swelling, SOB or lightheadedness with hypotension: Yes Has patient had a PCN reaction causing severe rash involving mucus membranes or skin necrosis: No Has patient had a PCN reaction that required hospitalization: No Has patient had a PCN reaction occurring within the last 10 years: Yes If all of the above answers are "NO", then may proceed with Cephalosporin use.  . Vibramycin [Doxycycline Calcium] Rash    VITALS:  Blood pressure 139/61, pulse 73, temperature 97.8 F (36.6 C), temperature source Oral, resp. rate 19, weight 119.1 kg, SpO2 100 %.  PHYSICAL EXAMINATION:  GENERAL:  68 y.o.-year-old patient lying in the bed with no acute  distress.  EYES: Pupils equal, round, reactive to light No scleral icterus. Extraocular muscles intact.  HEENT: Head atraumatic, normocephalic. Oropharynx and nasopharynx clear.  NECK:  Supple, no jugular venous distention. No thyroid enlargement, no tenderness.  LUNGS: Normal breath sounds bilaterally, no wheezing, rales,rhonchi or crepitation. No use of accessory muscles of respiration.  CARDIOVASCULAR: S1, S2 normal. No murmurs, rubs, or gallops.  Patient does have costochondral tenderness present ABDOMEN: Soft, nontender, nondistended. Bowel sounds present. No organomegaly or mass.  EXTREMITIES: No pedal edema, cyanosis, or clubbing.  NEUROLOGIC: Cranial nerves II through XII are intact. Muscle strength 5/5 in all extremities. Sensation intact. Gait not checked.  PSYCHIATRIC: The patient is alert and oriented x 3.  SKIN: No obvious rash, lesion, or ulcer.    LABORATORY PANEL:   CBC Recent Labs  Lab 10/01/18 0655  WBC 3.5*  HGB 10.2*  HCT 31.5*  PLT 167   ------------------------------------------------------------------------------------------------------------------  Chemistries  Recent Labs  Lab 10/01/18 0655  NA 140  K 4.2  CL 107  CO2 25  GLUCOSE 87  BUN 26*  CREATININE 1.46*  CALCIUM 9.2   ------------------------------------------------------------------------------------------------------------------  Cardiac Enzymes No results for input(s): TROPONINI in the last 168 hours. ------------------------------------------------------------------------------------------------------------------  RADIOLOGY:  Dg Chest 2 View  Result Date: 09/30/2018 CLINICAL DATA:  Referral from PCP with abnormal EKG, chest pain for 1 day and shortness of breath with ambulation EXAM: CHEST - 2 VIEW COMPARISON:  Radiograph and chest CT 09/07/2017 FINDINGS: No consolidation, features of edema, pneumothorax, or effusion. Pulmonary vascularity is normally distributed. The cardiomediastinal  contours are unremarkable. No acute osseous or soft tissue  abnormality. Degenerative changes are present in the imaged spine and shoulders. IMPRESSION: No acute cardiopulmonary abnormality. Electronically Signed   By: Lovena Le M.D.   On: 09/30/2018 17:40    EKG:   Orders placed or performed during the hospital encounter of 09/30/18  . EKG 12-Lead  . EKG 12-Lead  . ED EKG  . ED EKG    ASSESSMENT AND PLAN:  68 year old female patient with history of gastritis, hypertension, hypothyroidism, rheumatoid arthritis comes in with chest pain, palpitations, dizziness.   #1 acute chest pain, symptoms consistent with costochondritis rather than acute coronary syndrome, patient high-sensitivity troponins are 9, patient is on aspirin, statins, beta-blockers, scheduled for stress test as per my discussion with cardiology Dr. Midge Minium, after that patient can be started on low-sodium diet and hopefully can be discharged after the stress test is negative. #2 hypothyroidism: Continue Synthroid 3.  Hyperlipidemia continue statins 4.  History of Rhematoid arthritis;, patient on methotrexate. History of peptic ulcer disease, patient told me that she cannot take NSAID's, can take oxycodone for the pain or Tylenol.  Patient does have costochondritis, use Tylenol, oxycodone for pain control.    All the records are reviewed and case discussed with Care Management/Social Workerr. Management plans discussed with the patient, family and they are in agreement.  CODE STATUS: Full code TOTAL TIME TAKING CARE OF THIS PATIENT: 35 minutes.   POSSIBLE D/C IN 1 DAY, DEPENDING ON CLINICAL CONDITION.   Epifanio Lesches M.D on 10/01/2018 at 12:44 PM  Between 7am to 6pm - Pager - [phone]  After 6pm go to [email or web address] - password [email or web address] [facility]  Office  [phone]  CC: Primary care physician; Juline Patch, MD   Note: This dictation was prepared with Dragon dictation  along with smaller phrase technology. Any transcriptional errors that result from this process are unintentional.

## 2018-10-01 NOTE — Progress Notes (Signed)
*  PRELIMINARY RESULTS* Echocardiogram 2D Echocardiogram has been performed. Definity IV Contrast used on this study.  [place] 10/01/2018, 7:03 PM

## 2018-10-02 DIAGNOSIS — R079 Chest pain, unspecified: Secondary | ICD-10-CM

## 2018-10-02 DIAGNOSIS — I10 Essential (primary) hypertension: Secondary | ICD-10-CM | POA: Diagnosis not present

## 2018-10-02 DIAGNOSIS — E039 Hypothyroidism, unspecified: Secondary | ICD-10-CM | POA: Diagnosis not present

## 2018-10-02 DIAGNOSIS — J45909 Unspecified asthma, uncomplicated: Secondary | ICD-10-CM | POA: Diagnosis not present

## 2018-10-02 DIAGNOSIS — R0789 Other chest pain: Secondary | ICD-10-CM | POA: Diagnosis not present

## 2018-10-02 LAB — ECHOCARDIOGRAM COMPLETE
Height: 71 in
Weight: 4200 oz

## 2018-10-02 LAB — HIV ANTIBODY (ROUTINE TESTING W REFLEX): HIV Screen 4th Generation wRfx: NONREACTIVE

## 2018-10-02 MED ORDER — TRIAMTERENE-HCTZ 37.5-25 MG PO TABS
2.0000 | ORAL_TABLET | Freq: Every day | ORAL | Status: DC
  Administered 2018-10-02: 2 via ORAL
  Filled 2018-10-02: qty 2

## 2018-10-02 MED ORDER — TRIAMTERENE-HCTZ 75-50 MG PO TABS
1.0000 | ORAL_TABLET | Freq: Every day | ORAL | Status: DC
  Filled 2018-10-02: qty 1

## 2018-10-02 MED ORDER — SODIUM POLYSTYRENE SULFONATE 15 GM/60ML PO SUSP: 30.0000 g | Freq: Once | ORAL | Status: DC

## 2018-10-02 MED ORDER — HYDRALAZINE HCL 50 MG PO TABS
50.0000 mg | ORAL_TABLET | Freq: Two times a day (BID) | ORAL | Status: DC
  Administered 2018-10-02: 50 mg via ORAL
  Filled 2018-10-02: qty 1

## 2018-10-02 MED ORDER — LOSARTAN POTASSIUM 25 MG PO TABS
25.0000 mg | ORAL_TABLET | Freq: Every day | ORAL | Status: DC
  Administered 2018-10-02: 25 mg via ORAL
  Filled 2018-10-02: qty 1

## 2018-10-02 MED ORDER — OMEGA-3-ACID ETHYL ESTERS 1 G PO CAPS
1.0000 g | ORAL_CAPSULE | Freq: Two times a day (BID) | ORAL | Status: DC
  Administered 2018-10-02: 1 g via ORAL
  Filled 2018-10-02: qty 1

## 2018-10-02 MED ORDER — ALBUTEROL SULFATE (2.5 MG/3ML) 0.083% IN NEBU: 2.5000 mg | INHALATION_SOLUTION | RESPIRATORY_TRACT | Status: DC | PRN

## 2018-10-02 MED ORDER — GUAIFENESIN-DM 100-10 MG/5ML PO SYRP
5.0000 mL | ORAL_SOLUTION | ORAL | Status: DC | PRN
  Administered 2018-10-02: 5 mL via ORAL
  Filled 2018-10-02: qty 5

## 2018-10-02 MED ORDER — SODIUM CHLORIDE 0.9 % IV BOLUS: 250.0000 mL | Freq: Once | INTRAVENOUS | Status: DC

## 2018-10-02 MED ORDER — FUROSEMIDE 10 MG/ML IJ SOLN: 40.0000 mg | Freq: Once | INTRAMUSCULAR | Status: DC

## 2018-10-02 MED ORDER — FLUTICASONE PROPIONATE 50 MCG/ACT NA SUSP
1.0000 | Freq: Every day | NASAL | Status: DC
  Administered 2018-10-02: 1 via NASAL
  Filled 2018-10-02 (×2): qty 16

## 2018-10-02 NOTE — Discharge Summary (Signed)
[facility] [place] NAME: Michele Moore    MR#:  [id number]  DATE OF BIRTH:  11/10/1950  DATE OF ADMISSION:  09/30/2018 ADMITTING PHYSICIAN: Lance Coon, MD  DATE OF DISCHARGE: 10/02/2018  3:10 PM  PRIMARY CARE PHYSICIAN: Juline Patch, MD    ADMISSION DIAGNOSIS:  Atypical chest pain [R07.89]  DISCHARGE DIAGNOSIS:  Principal Problem:   Exertional chest pain Active Problems:   Hypertension   Hypothyroidism   Gastroesophageal reflux disease   Mixed hyperlipidemia   Reactive airway disease   CKD (chronic kidney disease), stage III ([place])   SECONDARY DIAGNOSIS:   Past Medical History:  Diagnosis Date  . Allergy   . Anemia   . Arthritis   . Asthma   . Chronic kidney disease    STAGE 3 PER DR EASON 08/02/15  . Collagen vascular disease ([place])   . Family history of adverse reaction to anesthesia    sister difficult to put to sleep  . GERD (gastroesophageal reflux disease)   . H/O tooth extraction    all lower teeth 1/19  . Hemorrhoid   . History of hiatal hernia   . Hypertension   . Hypothyroidism   . Migraines   . Mixed hyperlipidemia   . Multiple gastric ulcers   . Thyroid disease   . Wears dentures    partial upper and lower    HOSPITAL COURSE:   68 year old female CKD stage III, GERD, hypertension, hypothyroidism, history of migraines, hyperlipidemia who presented to the hospital due to chest pain.  1.  Chest pain- patient was observed in the hospital due to her chest pain on telemetry, underwent 3 sets of cardiac markers which were negative.  Seen by cardiology and underwent a nuclear medicine stress test which showed normal ejection fraction with no acute wall motion abnormalities. -Chest pain has now resolved and patient is clinically asymptomatic therefore being discharged home.  Source of chest pain was likely either GI or musculoskeletal in nature.  2.  Hypothyroidism-patient will continue Synthroid.  3.  Essential  hypertension-patient will resume her hydralazine, losartan, triamterene/HCTZ upon discharge.  4.  History of gout-patient had no acute attack.  She will continue allopurinol.  5.  Neuropathy-patient will continue her gabapentin.  DISCHARGE CONDITIONS:   Full code  CONSULTS OBTAINED:  Treatment Team:  Wellington Hampshire, MD  DRUG ALLERGIES:   Allergies  Allergen Reactions  . Ampicillin Swelling  . Penicillins Anaphylaxis and Swelling    Has patient had a PCN reaction causing immediate rash, facial/tongue/throat swelling, SOB or lightheadedness with hypotension: Yes Has patient had a PCN reaction causing severe rash involving mucus membranes or skin necrosis: No Has patient had a PCN reaction that required hospitalization: No Has patient had a PCN reaction occurring within the last 10 years: Yes If all of the above answers are "NO", then may proceed with Cephalosporin use.  . Vibramycin [Doxycycline Calcium] Rash    DISCHARGE MEDICATIONS:   Allergies as of 10/02/2018      Reactions   Ampicillin Swelling   Penicillins Anaphylaxis, Swelling   Has patient had a PCN reaction causing immediate rash, facial/tongue/throat swelling, SOB or lightheadedness with hypotension: Yes Has patient had a PCN reaction causing severe rash involving mucus membranes or skin necrosis: No Has patient had a PCN reaction that required hospitalization: No Has patient had a PCN reaction occurring within the last 10 years: Yes If all of the above answers are "NO", then may proceed  with Cephalosporin use.   Vibramycin [doxycycline Calcium] Rash      Medication List    TAKE these medications   albuterol 108 (90 Base) MCG/ACT inhaler Commonly known as: VENTOLIN HFA Inhale 2 puffs into the lungs every 4 (four) hours as needed for wheezing.   allopurinol 100 MG tablet Commonly known as: ZYLOPRIM Take 200 mg by mouth daily.   clotrimazole 1 % cream Commonly known as: LOTRIMIN Apply to affected area 2  times daily What changed:   how much to take  how to take this  when to take this  additional instructions   diclofenac sodium 1 % Gel Commonly known as: VOLTAREN Apply 2 g topically 3 (three) times daily.   ferrous sulfate 325 (65 FE) MG tablet Take 325 mg by mouth daily with breakfast.   fluticasone 50 MCG/ACT nasal spray Commonly known as: FLONASE SHAKE LIQUID AND USE 1 SPRAY IN EACH NOSTRIL DAILY What changed: See the new instructions.   gabapentin 600 MG tablet Commonly known as: Neurontin Take 1 tablet (600 mg total) by mouth at bedtime.   hydrALAZINE 50 MG tablet Commonly known as: APRESOLINE Take 1 tablet (50 mg total) by mouth 2 (two) times daily.   HYDROcodone-acetaminophen 7.5-325 MG tablet Commonly known as: Norco Take 1 tablet by mouth every 8 (eight) hours as needed for up to 30 days for severe pain. Must last 30 days. What changed: Another medication with the same name was removed. Continue taking this medication, and follow the directions you see here.   HYDROcodone-acetaminophen 7.5-325 MG tablet Commonly known as: Norco Take 1 tablet by mouth every 8 (eight) hours as needed for up to 30 days for severe pain. Must last 30 days. What changed: Another medication with the same name was removed. Continue taking this medication, and follow the directions you see here.   levothyroxine 200 MCG tablet Commonly known as: Synthroid TAKE 1 TABLET(200 MCG) BY MOUTH DAILY BEFORE BREAKFAST What changed:   how much to take  how to take this  when to take this  additional instructions   Synthroid 25 MCG tablet Generic drug: levothyroxine TAKE 1 TABLET BY MOUTH EVERY DAY BEFORE BREAKFAST. TAKE WITH 200 MCG TABLETS FOR TOTAL OF 225 MCG. What changed: See the new instructions.   losartan 25 MG tablet Commonly known as: COZAAR Take 25 mg by mouth daily.   meclizine 12.5 MG tablet Commonly known as: ANTIVERT Take 1 tablet (12.5 mg total) by mouth 2 (two)  times daily as needed for dizziness.   methotrexate 2.5 MG tablet Commonly known as: RHEUMATREX Take 20 mg by mouth every Friday.   omega-3 acid ethyl esters 1 g capsule Commonly known as: LOVAZA Take 1 capsule (1 g total) by mouth 2 (two) times daily.   omeprazole 40 MG capsule Commonly known as: PRILOSEC TAKE 1 CAPSULE(40 MG) BY MOUTH DAILY What changed:   how much to take  how to take this  when to take this  additional instructions   ONE-A-DAY WOMENS FORMULA PO Take 1 tablet by mouth daily.   potassium chloride SA 20 MEQ tablet Commonly known as: K-DUR Take 0.5 tablets (10 mEq total) by mouth daily.   SYSTANE BALANCE OP Apply 1 drop to eye 2 (two) times daily.   SYSTANE OP Place 1 drop into both eyes daily as needed (dry eyes).   tiZANidine 4 MG tablet Commonly known as: ZANAFLEX Take 4 mg by mouth 2 (two) times daily.   triamcinolone cream 0.1 %  Commonly known as: KENALOG Apply 1 application topically every morning.   triamterene-hydrochlorothiazide 75-50 MG tablet Commonly known as: MAXZIDE Take 1 tablet by mouth daily.   Vitamin D3 50 MCG (2000 UT) Tabs Take 2,000 Units by mouth daily.         DISCHARGE INSTRUCTIONS:   DIET:  Cardiac diet  DISCHARGE CONDITION:  Stable  ACTIVITY:  Activity as tolerated  OXYGEN:  Home Oxygen: No.   Oxygen Delivery: room air  DISCHARGE LOCATION:  home   If you experience worsening of your admission symptoms, develop shortness of breath, life threatening emergency, suicidal or homicidal thoughts you must seek medical attention immediately by calling 911 or calling your MD immediately  if symptoms less severe.  You Must read complete instructions/literature along with all the possible adverse reactions/side effects for all the Medicines you take and that have been prescribed to you. Take any new Medicines after you have completely understood and accpet all the possible adverse reactions/side effects.    Please note  You were cared for by a hospitalist during your hospital stay. If you have any questions about your discharge medications or the care you received while you were in the hospital after you are discharged, you can call the unit and asked to speak with the hospitalist on call if the hospitalist that took care of you is not available. Once you are discharged, your primary care physician will handle any further medical issues. Please note that NO REFILLS for any discharge medications will be authorized once you are discharged, as it is imperative that you return to your primary care physician (or establish a relationship with a primary care physician if you do not have one) for your aftercare needs so that they can reassess your need for medications and monitor your lab values.     Today   Patient denies any chest pain acutely, clinically asymptomatic presently.  Still has a mild cough but lungs are clear and she has no other associated symptoms.  Will discharge home today given her negative stress test.  VITAL SIGNS:  Blood pressure 130/60, pulse 78, temperature 97.9 F (36.6 C), resp. rate 18, weight 119.1 kg, SpO2 100 %.  I/O:    Intake/Output Summary (Last 24 hours) at 10/02/2018 1635 Last data filed at 10/02/2018 1200 Gross per 24 hour  Intake -  Output 1400 ml  Net -1400 ml    PHYSICAL EXAMINATION:  GENERAL:  68 y.o.-year-old patient lying in the bed with no acute distress.  EYES: Pupils equal, round, reactive to light and accommodation. No scleral icterus. Extraocular muscles intact.  HEENT: Head atraumatic, normocephalic. Oropharynx and nasopharynx clear.  NECK:  Supple, no jugular venous distention. No thyroid enlargement, no tenderness.  LUNGS: Normal breath sounds bilaterally, no wheezing, rales,rhonchi. No use of accessory muscles of respiration.  CARDIOVASCULAR: S1, S2 normal. No murmurs, rubs, or gallops.  ABDOMEN: Soft, non-tender, non-distended. Bowel sounds  present. No organomegaly or mass.  EXTREMITIES: No pedal edema, cyanosis, or clubbing.  NEUROLOGIC: Cranial nerves II through XII are intact. No focal motor or sensory defecits b/l.  PSYCHIATRIC: The patient is alert and oriented x 3. SKIN: No obvious rash, lesion, or ulcer.   DATA REVIEW:   CBC Recent Labs  Lab 10/01/18 0655  WBC 3.5*  HGB 10.2*  HCT 31.5*  PLT 167    Chemistries  Recent Labs  Lab 10/01/18 0655 10/01/18 1248  NA 140  --   K 4.2  --   CL 107  --  CO2 25  --   GLUCOSE 87  --   BUN 26*  --   CREATININE 1.46*  --   CALCIUM 9.2  --   MG  --  2.0  AST  --  70*  ALT  --  134*  ALKPHOS  --  66  BILITOT  --  0.6    Cardiac Enzymes No results for input(s): TROPONINI in the last 168 hours.  Microbiology Results  Results for orders placed or performed during the hospital encounter of 09/30/18  SARS CORONAVIRUS 2 Nasal Swab Aptima Multi Swab     Status: None   Collection Time: 09/30/18  9:09 PM   Specimen: Aptima Multi Swab; Nasal Swab  Result Value Ref Range Status   SARS Coronavirus 2 NEGATIVE NEGATIVE Final    Comment: (NOTE) SARS-CoV-2 target nucleic acids are NOT DETECTED. The SARS-CoV-2 RNA is generally detectable in upper and lower respiratory specimens during the acute phase of infection. Negative results do not preclude SARS-CoV-2 infection, do not rule out co-infections with other pathogens, and should not be used as the sole basis for treatment or other patient management decisions. Negative results must be combined with clinical observations, patient history, and epidemiological information. The expected result is Negative. Fact Sheet for Patients: SugarRoll.be Fact Sheet for Healthcare Providers: https://www.woods-mathews.com/ This test is not yet approved or cleared by the [place] FDA and  has been authorized for detection and/or diagnosis of SARS-CoV-2 by FDA under an Emergency Use  Authorization (EUA). This EUA will remain  in effect (meaning this test can be used) for the duration of the COVID-19 declaration under Section 56 4(b)(1) of the Act, 21 U.S.C. section 360bbb-3(b)(1), unless the authorization is terminated or revoked sooner. Performed at [facility], [place] [address], [place], [place] [address]     RADIOLOGY:  Dg Chest 2 View  Result Date: 09/30/2018 CLINICAL DATA:  Referral from PCP with abnormal EKG, chest pain for 1 day and shortness of breath with ambulation EXAM: CHEST - 2 VIEW COMPARISON:  Radiograph and chest CT 09/07/2017 FINDINGS: No consolidation, features of edema, pneumothorax, or effusion. Pulmonary vascularity is normally distributed. The cardiomediastinal contours are unremarkable. No acute osseous or soft tissue abnormality. Degenerative changes are present in the imaged spine and shoulders. IMPRESSION: No acute cardiopulmonary abnormality. Electronically Signed   By: Lovena Le M.D.   On: 09/30/2018 17:40   Nm Myocar Multi W/spect W/wall Motion / Ef  Result Date: 10/01/2018  There was no ST segment deviation noted during stress.  No T wave inversion was noted during stress.  The study is normal.  This is a low risk study.  The left ventricular ejection fraction is normal (55-65%).       Management plans discussed with the patient, family and they are in agreement.  CODE STATUS:     Code Status Orders  (From admission, onward)         Start     Ordered   09/30/18 2343  Full code  Continuous     09/30/18 2343       TOTAL TIME TAKING CARE OF THIS PATIENT: 40 minutes.    Henreitta Leber M.D on 10/02/2018 at 4:35 PM  Between 7am to 6pm - Pager - [phone]  After 6pm go to [email or web address] - Proofreader  [facility]  Office  [phone]  CC: Primary care physician; Juline Patch, MD

## 2018-10-02 NOTE — Progress Notes (Signed)
Patient given discharge instructions. IV taken out and tele monitor off. Patient verbalized understanding with no questions or concerns.

## 2018-10-02 NOTE — Plan of Care (Signed)
  Problem: Education: Goal: Knowledge of General Education information will improve Description: Including pain rating scale, medication(s)/side effects and non-pharmacologic comfort measures Outcome: Adequate for Discharge   Problem: Health Behavior/Discharge Planning: Goal: Ability to manage health-related needs will improve Outcome: Adequate for Discharge   Problem: Clinical Measurements: Goal: Ability to maintain clinical measurements within normal limits will improve Outcome: Adequate for Discharge Goal: Will remain free from infection Outcome: Adequate for Discharge Goal: Diagnostic test results will improve Outcome: Adequate for Discharge Goal: Respiratory complications will improve Outcome: Adequate for Discharge Goal: Cardiovascular complication will be avoided Outcome: Adequate for Discharge   Problem: Education: Goal: Understanding of cardiac disease, CV risk reduction, and recovery process will improve Outcome: Adequate for Discharge Goal: Individualized Educational Video(s) Outcome: Adequate for Discharge   Problem: Activity: Goal: Ability to tolerate increased activity will improve Outcome: Adequate for Discharge   Problem: Cardiac: Goal: Ability to achieve and maintain adequate cardiovascular perfusion will improve Outcome: Adequate for Discharge   Problem: Health Behavior/Discharge Planning: Goal: Ability to safely manage health-related needs after discharge will improve Outcome: Adequate for Discharge

## 2018-10-02 NOTE — Progress Notes (Addendum)
Pnt reports that about 1 month ago her MD increased her Synthroid to 225 mcg. Pnt reports that she has been taking it as ordered. Reports that chest pain that brought her into the hospital 8/6 has been going on for approximately 2 weeks.  Pulse overnight has been between 42-55.    TSH taken 10/01/18 was Results for Michele Moore, Michele Moore (MRN [id number]) as of 10/02/2018 05:41  Ref. Range 10/01/2018 12:48  TSH Latest Ref Range: 0.350 - 4.500 uIU/mL 0.196 (L)

## 2018-10-04 ENCOUNTER — Ambulatory Visit
Payer: Medicare Other | Attending: Student in an Organized Health Care Education/Training Program | Admitting: Student in an Organized Health Care Education/Training Program

## 2018-10-04 ENCOUNTER — Other Ambulatory Visit: Payer: Self-pay

## 2018-10-04 ENCOUNTER — Encounter: Payer: Self-pay | Admitting: Student in an Organized Health Care Education/Training Program

## 2018-10-04 DIAGNOSIS — M19012 Primary osteoarthritis, left shoulder: Secondary | ICD-10-CM | POA: Diagnosis not present

## 2018-10-04 DIAGNOSIS — M7062 Trochanteric bursitis, left hip: Secondary | ICD-10-CM | POA: Diagnosis not present

## 2018-10-04 DIAGNOSIS — M5136 Other intervertebral disc degeneration, lumbar region: Secondary | ICD-10-CM

## 2018-10-04 DIAGNOSIS — M47818 Spondylosis without myelopathy or radiculopathy, sacral and sacrococcygeal region: Secondary | ICD-10-CM

## 2018-10-04 DIAGNOSIS — G894 Chronic pain syndrome: Secondary | ICD-10-CM

## 2018-10-04 NOTE — Progress Notes (Signed)
Pain Management Virtual Encounter Note - Virtual Visit via Hunt (real-time audio visits between healthcare provider and patient).   Patient's Phone No. & Preferred Pharmacy:  [phone] (home); There is no such number on file (mobile).; (Preferred) [phone] No e-mail address on record  [facility] [place], [place] [address] [place] [address] Phone: [phone] Fax: [phone]    Pre-screening note:  Our staff contacted Michele Moore and offered her an "in person", "face-to-face" appointment versus a telephone encounter. She indicated preferring the telephone encounter, at this time.   Reason for Virtual Visit: COVID-19*  Social distancing based on CDC and AMA recommendations.   I contacted Michele Moore on 10/04/2018 via video conference.      I clearly identified myself as Gillis Santa, MD. I verified that I was speaking with the correct person using two identifiers (Name: Michele Moore, and date of birth: [birth year]).  Advanced Informed Consent I sought verbal advanced consent from Michele Moore for virtual visit interactions. I informed Michele Moore of possible security and privacy concerns, risks, and limitations associated with providing "not-in-person" medical evaluation and management services. I also informed Michele Moore of the availability of "in-person" appointments. Finally, I informed her that there would be a charge for the virtual visit and that she could be  personally, fully or partially, financially responsible for it. Michele Moore expressed understanding and agreed to proceed.   Historic Elements   Michele Moore is a 68 y.o. year old, female patient evaluated today after her last encounter by our practice on 09/07/2018. Michele Moore  has a past medical history of Allergy, Anemia, Arthritis, Asthma, Chronic kidney disease, Collagen vascular disease ([place]), Family history of adverse reaction  to anesthesia, GERD (gastroesophageal reflux disease), H/O tooth extraction, Hemorrhoid, History of hiatal hernia, Hypertension, Hypothyroidism, Migraines, Mixed hyperlipidemia, Multiple gastric ulcers, Thyroid disease, and Wears dentures. She also  has a past surgical history that includes Carpal tunnel release; Rotator cuff repair; Ankle surgery; Tubal ligation; Colonoscopy (2000?); Upper gi endoscopy (2000?); Tonsillectomy; Fusion of talonavicular joint (Right, 08/03/2015); Colonoscopy with propofol (N/A, 10/22/2017); Esophagogastroduodenoscopy (egd) with propofol (N/A, 10/22/2017); polypectomy (N/A, 10/22/2017); Joint replacement; and Total knee revision (Right, 01/20/2018). Michele Moore has a current medication list which includes the following prescription(s): albuterol, allopurinol, vitamin d3, clotrimazole, diclofenac sodium, ferrous sulfate, fluticasone, gabapentin, hydralazine, hydrocodone-acetaminophen, levothyroxine, meclizine, multiple vitamins-calcium, omega-3 acid ethyl esters, omeprazole, polyethyl glycol-propyl glycol, potassium chloride sa, propylene glycol, synthroid, triamcinolone cream, triamterene-hydrochlorothiazide, losartan, methotrexate, and tizanidine. She  reports that she quit smoking about 25 years ago. She has never used smokeless tobacco. She reports that she does not drink alcohol or use drugs. Michele Moore is allergic to ampicillin; penicillins; and vibramycin [doxycycline calcium].   HPI  Today, she is being contacted for a post-procedure assessment.  Evaluation of last interventional procedure  09/06/2018 Procedure: Left glenohumeral joint injection #1 Pre-procedure pain score:  7/10 Post-procedure pain score: 0/10         Influential Factors: Intra-procedural challenges: None observed.         Reported side-effects: None.        Post-procedural adverse reactions or complications: None reported         Sedation: Please see nurses note for DOS. When no sedatives are used, the  analgesic levels obtained are directly associated to the effectiveness of the local anesthetics. However, when sedation is provided,  the level of analgesia obtained during the initial 1 hour following the intervention, is believed to be the result of a combination of factors. These factors may include, but are not limited to: 1. The effectiveness of the local anesthetics used. 2. The effects of the analgesic(s) and/or anxiolytic(s) used. 3. The degree of discomfort experienced by the patient at the time of the procedure. 4. The patients ability and reliability in recalling and recording the events. 5. The presence and influence of possible secondary gains and/or psychosocial factors. Reported result: Relief experienced during the 1st hour after the procedure: 100 % (Ultra-Short Term Relief)            Interpretative annotation: Clinically appropriate result. Analgesia during this period is likely to be Local Anesthetic and/or IV Sedative (Analgesic/Anxiolytic) related.          Effects of local anesthetic: The analgesic effects attained during this period are directly associated to the localized infiltration of local anesthetics and therefore cary significant diagnostic value as to the etiological location, or anatomical origin, of the pain. Expected duration of relief is directly dependent on the pharmacodynamics of the local anesthetic used. Long-acting (4-6 hours) anesthetics used.  Reported result: Relief during the next 4 to 6 hour after the procedure: 100 % (Short-Term Relief)            Interpretative annotation: Clinically appropriate result. Analgesia during this period is likely to be Local Anesthetic-related.          Long-term benefit: Defined as the period of time past the expected duration of local anesthetics (1 hour for short-acting and 4-6 hours for long-acting). With the possible exception of prolonged sympathetic blockade from the local anesthetics, benefits during this period are  typically attributed to, or associated with, other factors such as analgesic sensory neuropraxia, antiinflammatory effects, or beneficial biochemical changes provided by agents other than the local anesthetics.  Reported result: Extended relief following procedure: 50 % (Long-Term Relief)            Interpretative annotation: Clinically appropriate result. Good relief. No permanent benefit expected. Inflammation plays a part in the etiology to the pain.          Repeat left shoulder injection PRN.  UDS:  Summary  Date Value Ref Range Status  03/30/2018 FINAL  Final    Comment:    ==================================================================== TOXASSURE SELECT 13 (MW) ==================================================================== Test                             Result       Flag       Units Drug Present and Declared for Prescription Verification   Hydrocodone                    824          EXPECTED   ng/mg creat   Hydromorphone                  233          EXPECTED   ng/mg creat   Dihydrocodeine                 100          EXPECTED   ng/mg creat   Norhydrocodone                 545          EXPECTED   ng/mg  creat    Sources of hydrocodone include scheduled prescription    medications. Hydromorphone, dihydrocodeine and norhydrocodone are    expected metabolites of hydrocodone. Hydromorphone and    dihydrocodeine are also available as scheduled prescription    medications. ==================================================================== Test                      Result    Flag   Units      Ref Range   Creatinine              127              mg/dL      >=20 ==================================================================== Declared Medications:  The flagging and interpretation on this report are based on the  following declared medications.  Unexpected results may arise from  inaccuracies in the declared medications.  **Note: The testing scope of this panel includes  these medications:  Hydrocodone (Norco)  **Note: The testing scope of this panel does not include following  reported medications:  Acetaminophen (Norco)  Albuterol  Allopurinol  Calcium  Cholecalciferol  Diclofenac  Fluticasone  Gabapentin  Hydralazine  Hydrochlorothiazide (Maxzide)  Iron (Ferrous Sulfate)  Ketoconazole (Nizoral)  Levothyroxine (Synthroid)  Meclizine (Antivert)  Multivitamin  Nystatin (Mycostatin)  Omega-3 Fatty Acids (Lovaza)  Omeprazole (Prilosec)  Polyethylene Glycol  Potassium (K-Dur)  Ranitidine (Zantac)  Tizanidine (Zanaflex)  Triamcinolone (Kenalog)  Triamterene (Maxzide)  Undefined Miscellaneous Drug ==================================================================== For clinical consultation, please call [phone]. ====================================================================    Laboratory Chemistry Profile (12 mo)  Renal: 07/14/2018: BUN/Creatinine Ratio 15 10/01/2018: BUN 26; Creatinine, Ser 1.46  Lab Results  Component Value Date   GFRAA 42 (L) 10/01/2018   GFRNONAA 36 (L) 10/01/2018   Hepatic: 10/01/2018: Albumin 3.7 Lab Results  Component Value Date   AST 70 (H) 10/01/2018   ALT 134 (H) 10/01/2018   Other: 11/05/2017: CRP <0.8; Sed Rate 52 Note: Above Lab results reviewed.  Imaging  Last 90 days:  Dg Chest 2 View  Result Date: 09/30/2018 CLINICAL DATA:  Referral from PCP with abnormal EKG, chest pain for 1 day and shortness of breath with ambulation EXAM: CHEST - 2 VIEW COMPARISON:  Radiograph and chest CT 09/07/2017 FINDINGS: No consolidation, features of edema, pneumothorax, or effusion. Pulmonary vascularity is normally distributed. The cardiomediastinal contours are unremarkable. No acute osseous or soft tissue abnormality. Degenerative changes are present in the imaged spine and shoulders. IMPRESSION: No acute cardiopulmonary abnormality. Electronically Signed   By: Lovena Le M.D.   On: 09/30/2018 17:40   Nm Myocar  Multi W/spect W/wall Motion / Ef  Result Date: 10/01/2018  There was no ST segment deviation noted during stress.  No T wave inversion was noted during stress.  The study is normal.  This is a low risk study.  The left ventricular ejection fraction is normal (55-65%).    Dg Pain Clinic C-arm 1-60 Min No Report  Result Date: 09/06/2018 Fluoro was used, but no Radiologist interpretation will be provided. Please refer to "NOTES" tab for provider progress note.  Last Hospital Admission:  Nm Myocar Multi W/spect W/wall Motion / Ef  Result Date: 10/01/2018  There was no ST segment deviation noted during stress.  No T wave inversion was noted during stress.  The study is normal.  This is a low risk study.  The left ventricular ejection fraction is normal (55-65%).    Assessment  The primary encounter diagnosis was SI joint arthritis (left). Diagnoses of Trochanteric bursitis  of left hip, Primary osteoarthritis of left shoulder, Lumbar degenerative disc disease, and Chronic pain syndrome were also pertinent to this visit.  Plan of Care  I am having Shawntee L. Hardt maintain her Polyethyl Glycol-Propyl Glycol (SYSTANE OP), ferrous sulfate, Multiple Vitamins-Calcium (ONE-A-DAY WOMENS FORMULA PO), allopurinol, diclofenac sodium, Vitamin D3, meclizine, albuterol, triamcinolone cream, Propylene Glycol (SYSTANE BALANCE OP), clotrimazole, hydrALAZINE, omega-3 acid ethyl esters, omeprazole, potassium chloride SA, levothyroxine, triamterene-hydrochlorothiazide, HYDROcodone-acetaminophen, gabapentin, fluticasone, and Synthroid.  1. SI joint arthritis (left)  -LT  SACROILIAC JOINT INJECTION; Future  2. Trochanteric bursitis of left hip - Left GTB HIP INJECTION; Future  3. Primary osteoarthritis of left shoulder - SHOULDER INJECTION; Standing, good relief with one on 7/13, repeat prn  4. Lumbar degenerative disc disease -MM as above  5. Chronic pain syndrome -MM as above   Orders:  Orders  Placed This Encounter  Procedures  . HIP INJECTION    Standing Status:   Future    Standing Expiration Date:   01/04/2019    Scheduling Instructions:     Left trochanteric busra injection with sedation  . SACROILIAC JOINT INJECTION    Standing Status:   Future    Standing Expiration Date:   11/04/2018    Scheduling Instructions:     Side: LEFT     Sedation: WITH     Timeframe: ASAP    Order Specific Question:   Where will this procedure be performed?    Answer:   [facility]  . SHOULDER INJECTION    For shoulder pain.    Standing Status:   Standing    Number of Occurrences:   1    Standing Expiration Date:   10/04/2019    Scheduling Instructions:     Side: LEFT     Sedation: Patient's choice.     TIMEFRAME: PRN procedure. (Ms. Scheck will call when needed.)    Order Specific Question:   Where will this procedure be performed?    Answer:   [facility]    Comments:   Taha Dimond   Follow-up plan:   Return in about 1 week (around 10/11/2018) for Procedure left SIJ + left GTB (30 mins) with sedation.     Recent Visits Date Type Provider Dept  09/06/18 Procedure visit Gillis Santa, MD [facility]  08/23/18 Procedure visit Gillis Santa, MD [facility]  08/19/18 Office Visit Gillis Santa, MD [facility]  Showing recent visits within past 90 days and meeting all other requirements   Today's Visits Date Type Provider Dept  10/04/18 Office Visit Gillis Santa, MD [facility]  Showing today's visits and meeting all other requirements   Future Appointments Date Type Provider Dept  11/18/18 Appointment Gillis Santa, MD [facility]  Showing future appointments within next 90 days and meeting all other requirements   I discussed the assessment and treatment plan with the patient. The patient was provided an opportunity to ask questions and all were answered. The patient agreed with the plan and demonstrated an  understanding of the instructions.  Patient advised to call back or seek an in-person evaluation if the symptoms or condition worsens.  Total duration of non-face-to-face encounter: 48minutes.  Note by: Gillis Santa, MD Date: 10/04/2018; Time: 3:12 PM  Note: This dictation was prepared with Dragon dictation. Any transcriptional errors that may result from this process are unintentional.  Disclaimer:  * Given the special circumstances of the COVID-19 pandemic, the federal government has announced that the  Office for Civil Rights (OCR) will exercise its enforcement discretion and will not impose penalties on physicians using telehealth in the event of noncompliance with regulatory requirements under the [place] (HIPAA) in connection with the good faith provision of telehealth during the BSWHQ-75 national public health emergency. ([place])

## 2018-10-18 ENCOUNTER — Ambulatory Visit (HOSPITAL_BASED_OUTPATIENT_CLINIC_OR_DEPARTMENT_OTHER): Payer: Medicare Other | Admitting: Student in an Organized Health Care Education/Training Program

## 2018-10-18 ENCOUNTER — Encounter: Payer: Self-pay | Admitting: Student in an Organized Health Care Education/Training Program

## 2018-10-18 ENCOUNTER — Other Ambulatory Visit: Payer: Self-pay

## 2018-10-18 ENCOUNTER — Ambulatory Visit
Admission: RE | Admit: 2018-10-18 | Discharge: 2018-10-18 | Disposition: A | Discharge status: Home / Self Care | Payer: Medicare Other | Source: Ambulatory Visit | Attending: Student in an Organized Health Care Education/Training Program | Admitting: Student in an Organized Health Care Education/Training Program

## 2018-10-18 VITALS — BP 117/65 | HR 58 | Temp 97.8°F | Resp 16 | Ht 71.0 in | Wt 262.0 lb

## 2018-10-18 DIAGNOSIS — M47818 Spondylosis without myelopathy or radiculopathy, sacral and sacrococcygeal region: Secondary | ICD-10-CM

## 2018-10-18 DIAGNOSIS — M7062 Trochanteric bursitis, left hip: Secondary | ICD-10-CM | POA: Diagnosis not present

## 2018-10-18 DIAGNOSIS — M461 Sacroiliitis, not elsewhere classified: Secondary | ICD-10-CM

## 2018-10-18 MED ORDER — DEXAMETHASONE SODIUM PHOSPHATE 10 MG/ML IJ SOLN
10.0000 mg | Freq: Once | INTRAMUSCULAR | Status: AC
  Administered 2018-10-18: 10 mg

## 2018-10-18 MED ORDER — IOHEXOL 180 MG/ML  SOLN
10.0000 mL | Freq: Once | INTRAMUSCULAR | Status: AC
  Administered 2018-10-18: 10 mL via EPIDURAL

## 2018-10-18 MED ORDER — ROPIVACAINE HCL 2 MG/ML IJ SOLN
1.0000 mL | Freq: Once | INTRAMUSCULAR | Status: AC
  Administered 2018-10-18: 09:00:00 1 mL via EPIDURAL

## 2018-10-18 MED ORDER — DEXAMETHASONE SODIUM PHOSPHATE 10 MG/ML IJ SOLN
INTRAMUSCULAR | Status: AC
  Filled 2018-10-18: qty 2

## 2018-10-18 MED ORDER — ROPIVACAINE HCL 2 MG/ML IJ SOLN
INTRAMUSCULAR | Status: AC
  Filled 2018-10-18: qty 10

## 2018-10-18 MED ORDER — LIDOCAINE HCL 2 % IJ SOLN
INTRAMUSCULAR | Status: AC
  Filled 2018-10-18: qty 20

## 2018-10-18 MED ORDER — FENTANYL CITRATE (PF) 100 MCG/2ML IJ SOLN
25.0000 ug | INTRAMUSCULAR | Status: DC | PRN
  Administered 2018-10-18: 25 ug via INTRAVENOUS
  Filled 2018-10-18: qty 2

## 2018-10-18 MED ORDER — LIDOCAINE HCL 2 % IJ SOLN
20.0000 mL | Freq: Once | INTRAMUSCULAR | Status: AC
  Administered 2018-10-18: 400 mg

## 2018-10-18 NOTE — Progress Notes (Signed)
Patient's Name: Michele Moore  MRN: [id number]  Referring Provider: Juline Patch, MD  DOB: Aug 02, 1950  PCP: Juline Patch, MD  DOS: 10/18/2018  Note by: Gillis Santa, MD  Service setting: Ambulatory outpatient  Specialty: Interventional Pain Management  Patient type: Established  Location: [facility] (AMB) Pain Management Facility  Visit type: Interventional Procedure   Primary Reason for Visit: Interventional Pain Management Treatment. CC: Hip Pain (left )  Procedure:          Anesthesia, Analgesia, Anxiolysis:  Type: Trochanteric Bursa Injection #1  Primary Purpose: Diagnostic Region: Upper (proximal) Femoral Region Level: Hip Joint Target Area: Superior aspect of the hip joint cavity, going thru the superior portion of the capsular ligament. Approach: Posterolateral approach Laterality: Left  Type: Local Anesthesia with moderate sedation  Position: Prone   Indications: Left greater trochanteric bursitis  Pain Score: Pre-procedure: 8 /10 Post-procedure: 0-No pain/10   Pre-op Assessment:  Michele Moore is a 68 y.o. (year old), female patient, seen today for interventional treatment. She  has a past surgical history that includes Carpal tunnel release; Rotator cuff repair; Ankle surgery; Tubal ligation; Colonoscopy (2000?); Upper gi endoscopy (2000?); Tonsillectomy; Fusion of talonavicular joint (Right, 08/03/2015); Colonoscopy with propofol (N/A, 10/22/2017); Esophagogastroduodenoscopy (egd) with propofol (N/A, 10/22/2017); polypectomy (N/A, 10/22/2017); Joint replacement; and Total knee revision (Right, 01/20/2018). Michele Moore has a current medication list which includes the following prescription(s): albuterol, allopurinol, vitamin d3, clotrimazole, fluticasone, gabapentin, hydralazine, hydrocodone-acetaminophen, levothyroxine, losartan, meclizine, methotrexate, multiple vitamins-calcium, omega-3 acid ethyl esters, omeprazole, polyethyl glycol-propyl glycol, potassium chloride sa, propylene glycol,  synthroid, tizanidine, triamcinolone cream, triamterene-hydrochlorothiazide, diclofenac sodium, and ferrous sulfate, and the following Facility-Administered Medications: fentanyl. Her primarily concern today is the Hip Pain (left )  Initial Vital Signs:  Pulse/HCG Rate: 69ECG Heart Rate: (!) 58 Temp: 97.8 F (36.6 C) Resp: 16 BP: 126/85 SpO2: 98 %  BMI: Estimated body mass index is 36.54 kg/m as calculated from the following:   Height as of this encounter: 5\' 11"  (1.803 m).   Weight as of this encounter: 262 lb (118.8 kg).  Risk Assessment: Allergies: Reviewed. She is allergic to ampicillin; penicillins; and vibramycin [doxycycline calcium].  Allergy Precautions: None required Coagulopathies: Reviewed. None identified.  Blood-thinner therapy: None at this time Active Infection(s): Reviewed. None identified. Michele Moore is afebrile  Site Confirmation: Michele Moore was asked to confirm the procedure and laterality before marking the site Procedure checklist: Completed Consent: Before the procedure and under the influence of no sedative(s), amnesic(s), or anxiolytics, the patient was informed of the treatment options, risks and possible complications. To fulfill our ethical and legal obligations, as recommended by the American Medical Association's Code of Ethics, I have informed the patient of my clinical impression; the nature and purpose of the treatment or procedure; the risks, benefits, and possible complications of the intervention; the alternatives, including doing nothing; the risk(s) and benefit(s) of the alternative treatment(s) or procedure(s); and the risk(s) and benefit(s) of doing nothing. The patient was provided information about the general risks and possible complications associated with the procedure. These may include, but are not limited to: failure to achieve desired goals, infection, bleeding, organ or nerve damage, allergic reactions, paralysis, and death. In addition, the  patient was informed of those risks and complications associated to the procedure, such as failure to decrease pain; infection; bleeding; organ or nerve damage with subsequent damage to sensory, motor, and/or autonomic systems, resulting in permanent pain, numbness, and/or weakness of one or several areas of the  body; allergic reactions; (i.e.: anaphylactic reaction); and/or death. Furthermore, the patient was informed of those risks and complications associated with the medications. These include, but are not limited to: allergic reactions (i.e.: anaphylactic or anaphylactoid reaction(s)); adrenal axis suppression; blood sugar elevation that in diabetics may result in ketoacidosis or comma; water retention that in patients with history of congestive heart failure may result in shortness of breath, pulmonary edema, and decompensation with resultant heart failure; weight gain; swelling or edema; medication-induced neural toxicity; particulate matter embolism and blood vessel occlusion with resultant organ, and/or nervous system infarction; and/or aseptic necrosis of one or more joints. Finally, the patient was informed that Medicine is not an exact science; therefore, there is also the possibility of unforeseen or unpredictable risks and/or possible complications that may result in a catastrophic outcome. The patient indicated having understood very clearly. We have given the patient no guarantees and we have made no promises. Enough time was given to the patient to ask questions, all of which were answered to the patient's satisfaction. Michele Moore has indicated that she wanted to continue with the procedure. Attestation: I, the ordering provider, attest that I have discussed with the patient the benefits, risks, side-effects, alternatives, likelihood of achieving goals, and potential problems during recovery for the procedure that I have provided informed consent. Date  Time: 10/18/2018  8:14 AM  Pre-Procedure  Preparation:  Monitoring: As per clinic protocol. Respiration, ETCO2, SpO2, BP, heart rate and rhythm monitor placed and checked for adequate function Safety Precautions: Patient was assessed for positional comfort and pressure points before starting the procedure. Time-out: I initiated and conducted the "Time-out" before starting the procedure, as per protocol. The patient was asked to participate by confirming the accuracy of the "Time Out" information. Verification of the correct person, site, and procedure were performed and confirmed by me, the nursing staff, and the patient. "Time-out" conducted as per Joint Commission's Universal Protocol (UP.01.01.01). Time: 8101  Description of Procedure:          Area Prepped: Entire Posterolateral hip area. Prepping solution: DuraPrep (Iodine Povacrylex [0.7% available iodine] and Isopropyl Alcohol, 74% w/w) Safety Precautions: Aspiration looking for blood return was conducted prior to all injections. At no point did we inject any substances, as a needle was being advanced. No attempts were made at seeking any paresthesias. Safe injection practices and needle disposal techniques used. Medications properly checked for expiration dates. SDV (single dose vial) medications used. Description of the Procedure: Protocol guidelines were followed. The patient was placed in position over the procedure table. The target area was identified and the area prepped in the usual manner. Skin & deeper tissues infiltrated with local anesthetic. Appropriate amount of time allowed to pass for local anesthetics to take effect. The procedure needles were then advanced to the target area. Proper needle placement secured. Negative aspiration confirmed. Solution injected in intermittent fashion, asking for systemic symptoms every 0.5cc of injectate. The needles were then removed and the area cleansed, making sure to leave some of the prepping solution back to take advantage of its long  term bactericidal properties. Vitals:   10/18/18 0910 10/18/18 0918 10/18/18 0930 10/18/18 0941  BP: (!) 148/77 (!) 121/53 128/67 117/65  Pulse: (!) 58     Resp: 16 16 16 16   Temp:  97.8 F (36.6 C)    TempSrc:      SpO2:  100% 99% 99%  Weight:      Height:        Start  Time: 0857 hrs. End Time: 0907 hrs.  Materials:  Needle(s) Type: Spinal Needle Gauge: 22G Length: 5.0-in Medication(s): Please see orders for medications and dosing details. 3 cc solution made of 2 cc of 0.2% ropivacaine, 1 cc of Decadron 10 mg/cc.  This was injected into the bursa. Imaging Guidance (Non-Spinal):          Type of Imaging Technique: Fluoroscopy Guidance (Non-Spinal) Indication(s): Assistance in needle guidance and placement for procedures requiring needle placement in or near specific anatomical locations not easily accessible without such assistance. Exposure Time: Please see nurses notes. Contrast: Before injecting any contrast, we confirmed that the patient did not have an allergy to iodine, shellfish, or radiological contrast. Once satisfactory needle placement was completed at the desired level, radiological contrast was injected. Contrast injected under live fluoroscopy. No contrast complications. See chart for type and volume of contrast used. Fluoroscopic Guidance: I was personally present during the use of fluoroscopy. "Tunnel Vision Technique" used to obtain the best possible view of the target area. Parallax error corrected before commencing the procedure. "Direction-depth-direction" technique used to introduce the needle under continuous pulsed fluoroscopy. Once target was reached, antero-posterior, oblique, and lateral fluoroscopic projection used confirm needle placement in all planes. Images permanently stored in EMR. Interpretation: I personally interpreted the imaging intraoperatively. Adequate needle placement confirmed in multiple planes. Appropriate spread of contrast into desired area  was observed. No evidence of afferent or efferent intravascular uptake. Permanent images saved into the patient's record.  Antibiotic Prophylaxis:   Anti-infectives (From admission, onward)   None     Indication(s): None identified  Post-operative Assessment:  Post-procedure Vital Signs:  Pulse/HCG Rate: (!) 5862 Temp: 97.8 F (36.6 C) Resp: 16 BP: 117/65 SpO2: 99 %  EBL: None  Complications: No immediate post-treatment complications observed by team, or reported by patient.  Note: The patient tolerated the entire procedure well. A repeat set of vitals were taken after the procedure and the patient was kept under observation following institutional policy, for this type of procedure. Post-procedural neurological assessment was performed, showing return to baseline, prior to discharge. The patient was provided with post-procedure discharge instructions, including a section on how to identify potential problems. Should any problems arise concerning this procedure, the patient was given instructions to immediately contact us, at any time, without hesitation. In any case, we plan to contact the patient by telephone for a follow-up status report regarding this interventional procedure.  Comments:  No additional relevant information.  Plan of Care  Orders:  Orders Placed This Encounter  Procedures  . [facility] C-ARM 1-60 MIN NO REPORT    Intraoperative interpretation by procedural physician at [facility].    Standing Status:   Standing    Number of Occurrences:   1    Order Specific Question:   Reason for exam:    Answer:   Assistance in needle guidance and placement for procedures requiring needle placement in or near specific anatomical locations not easily accessible without such assistance.    Medications ordered for procedure: Meds ordered this encounter  Medications  . iohexol (OMNIPAQUE) 180 MG/ML injection 10 mL    Must be Myelogram-compatible. If not  available, you may substitute with a water-soluble, non-ionic, hypoallergenic, myelogram-compatible radiological contrast medium.  Marland Kitchen lidocaine (XYLOCAINE) 2 % (with pres) injection 400 mg  . fentaNYL (SUBLIMAZE) injection 25-50 mcg    Make sure Narcan is available in the pyxis when using this medication. In the event of respiratory depression (  RR< 8/min): Titrate NARCAN (naloxone) in increments of 0.1 to 0.2 mg IV at 2-3 minute intervals, until desired degree of reversal.  . ropivacaine (PF) 2 mg/mL (0.2%) (NAROPIN) injection 1 mL  . dexamethasone (DECADRON) injection 10 mg  . dexamethasone (DECADRON) injection 10 mg   Medications administered: We administered iohexol, lidocaine, fentaNYL, ropivacaine (PF) 2 mg/mL (0.2%), dexamethasone, and dexamethasone.    Follow-up plan:   Return Keep appt for next month, for Keep appt for next month.        Recent Visits Date Type Provider Dept  10/04/18 Office Visit Gillis Santa, MD [facility]  09/06/18 Procedure visit Gillis Santa, MD [facility]  08/23/18 Procedure visit Gillis Santa, MD [facility]  08/19/18 Office Visit Gillis Santa, MD [facility]  Showing recent visits within past 90 days and meeting all other requirements   Today's Visits Date Type Provider Dept  10/18/18 Procedure visit Gillis Santa, MD [facility]  Showing today's visits and meeting all other requirements   Future Appointments Date Type Provider Dept  11/18/18 Appointment Gillis Santa, MD [facility]  Showing future appointments within next 90 days and meeting all other requirements   Disposition: Discharge home  Discharge Date & Time: 10/18/2018; 0941 hrs.   Primary Care Physician: Juline Patch, MD Location: [facility] Note by: Gillis Santa, MD Date: 10/18/2018; Time: 9:45 AM  Disclaimer:  Medicine is not an exact science. The only guarantee in medicine is that  nothing is guaranteed. It is important to note that the decision to proceed with this intervention was based on the information collected from the patient. The Data and conclusions were drawn from the patient's questionnaire, the interview, and the physical examination. Because the information was provided in large part by the patient, it cannot be guaranteed that it has not been purposely or unconsciously manipulated. Every effort has been made to obtain as much relevant data as possible for this evaluation. It is important to note that the conclusions that lead to this procedure are derived in large part from the available data. Always take into account that the treatment will also be dependent on availability of resources and existing treatment guidelines, considered by other Pain Management Practitioners as being common knowledge and practice, at the time of the intervention. For Medico-Legal purposes, it is also important to point out that variation in procedural techniques and pharmacological choices are the acceptable norm. The indications, contraindications, technique, and results of the above procedure should only be interpreted and judged by a Board-Certified Interventional Pain Specialist with extensive familiarity and expertise in the same exact procedure and technique.

## 2018-10-18 NOTE — Progress Notes (Signed)
Patient's Name: Michele Moore  MRN: [id number]  Referring Provider: Juline Patch, MD  DOB: 01/14/1951  PCP: Juline Patch, MD  DOS: 10/18/2018  Note by: Gillis Santa, MD  Service setting: Ambulatory outpatient  Specialty: Interventional Pain Management  Patient type: Established  Location: [facility] (AMB) Pain Management Facility  Visit type: Interventional Procedure   Primary Reason for Visit: Interventional Pain Management Treatment. CC: Hip Pain (left )  Procedure:          Anesthesia, Analgesia, Anxiolysis:  Type: Therapeutic Sacroiliac Joint Steroid Injection #1  Region: Inferior Lumbosacral Region Level: PIIS (Posterior Inferior Iliac Spine) Laterality: Left-Side  Type: Moderate (Conscious) Sedation combined with Local Anesthesia Indication(s): Analgesia and Anxiety Route: Intravenous (IV) IV Access: Secured Sedation: Meaningful verbal contact was maintained at all times during the procedure  Local Anesthetic: Lidocaine 1-2%  Position: Prone           Indications: 1. SI joint arthritis (left)   2. Trochanteric bursitis of left hip    Pain Score: Pre-procedure: 8 /10 Post-procedure: 0-No pain/10   Pre-op Assessment:  Michele Moore is a 68 y.o. (year old), female patient, seen today for interventional treatment. She  has a past surgical history that includes Carpal tunnel release; Rotator cuff repair; Ankle surgery; Tubal ligation; Colonoscopy (2000?); Upper gi endoscopy (2000?); Tonsillectomy; Fusion of talonavicular joint (Right, 08/03/2015); Colonoscopy with propofol (N/A, 10/22/2017); Esophagogastroduodenoscopy (egd) with propofol (N/A, 10/22/2017); polypectomy (N/A, 10/22/2017); Joint replacement; and Total knee revision (Right, 01/20/2018). Michele Moore has a current medication list which includes the following prescription(s): albuterol, allopurinol, vitamin d3, clotrimazole, fluticasone, gabapentin, hydralazine, hydrocodone-acetaminophen, levothyroxine, losartan, meclizine, methotrexate,  multiple vitamins-calcium, omega-3 acid ethyl esters, omeprazole, polyethyl glycol-propyl glycol, potassium chloride sa, propylene glycol, synthroid, tizanidine, triamcinolone cream, triamterene-hydrochlorothiazide, diclofenac sodium, and ferrous sulfate, and the following Facility-Administered Medications: fentanyl. Her primarily concern today is the Hip Pain (left )  Initial Vital Signs:  Pulse/HCG Rate: 69ECG Heart Rate: (!) 58 Temp: 97.8 F (36.6 C) Resp: 16 BP: 126/85 SpO2: 98 %  BMI: Estimated body mass index is 36.54 kg/m as calculated from the following:   Height as of this encounter: 5\' 11"  (1.803 m).   Weight as of this encounter: 262 lb (118.8 kg).  Risk Assessment: Allergies: Reviewed. She is allergic to ampicillin; penicillins; and vibramycin [doxycycline calcium].  Allergy Precautions: None required Coagulopathies: Reviewed. None identified.  Blood-thinner therapy: None at this time Active Infection(s): Reviewed. None identified. [name] is afebrile  Site Confirmation: Michele Moore was asked to confirm the procedure and laterality before marking the site Procedure checklist: Completed Consent: Before the procedure and under the influence of no sedative(s), amnesic(s), or anxiolytics, the patient was informed of the treatment options, risks and possible complications. To fulfill our ethical and legal obligations, as recommended by the American Medical Association's Code of Ethics, I have informed the patient of my clinical impression; the nature and purpose of the treatment or procedure; the risks, benefits, and possible complications of the intervention; the alternatives, including doing nothing; the risk(s) and benefit(s) of the alternative treatment(s) or procedure(s); and the risk(s) and benefit(s) of doing nothing. The patient was provided information about the general risks and possible complications associated with the procedure. These may include, but are not limited to:  failure to achieve desired goals, infection, bleeding, organ or nerve damage, allergic reactions, paralysis, and death. In addition, the patient was informed of those risks and complications associated to the procedure, such as failure to decrease pain; infection;  bleeding; organ or nerve damage with subsequent damage to sensory, motor, and/or autonomic systems, resulting in permanent pain, numbness, and/or weakness of one or several areas of the body; allergic reactions; (i.e.: anaphylactic reaction); and/or death. Furthermore, the patient was informed of those risks and complications associated with the medications. These include, but are not limited to: allergic reactions (i.e.: anaphylactic or anaphylactoid reaction(s)); adrenal axis suppression; blood sugar elevation that in diabetics may result in ketoacidosis or comma; water retention that in patients with history of congestive heart failure may result in shortness of breath, pulmonary edema, and decompensation with resultant heart failure; weight gain; swelling or edema; medication-induced neural toxicity; particulate matter embolism and blood vessel occlusion with resultant organ, and/or nervous system infarction; and/or aseptic necrosis of one or more joints. Finally, the patient was informed that Medicine is not an exact science; therefore, there is also the possibility of unforeseen or unpredictable risks and/or possible complications that may result in a catastrophic outcome. The patient indicated having understood very clearly. We have given the patient no guarantees and we have made no promises. Enough time was given to the patient to ask questions, all of which were answered to the patient's satisfaction. Michele Moore has indicated that she wanted to continue with the procedure. Attestation: I, the ordering provider, attest that I have discussed with the patient the benefits, risks, side-effects, alternatives, likelihood of achieving goals, and  potential problems during recovery for the procedure that I have provided informed consent. Date  Time: 10/18/2018  8:14 AM  Pre-Procedure Preparation:  Monitoring: As per clinic protocol. Respiration, ETCO2, SpO2, BP, heart rate and rhythm monitor placed and checked for adequate function Safety Precautions: Patient was assessed for positional comfort and pressure points before starting the procedure. Time-out: I initiated and conducted the "Time-out" before starting the procedure, as per protocol. The patient was asked to participate by confirming the accuracy of the "Time Out" information. Verification of the correct person, site, and procedure were performed and confirmed by me, the nursing staff, and the patient. "Time-out" conducted as per Joint Commission's Universal Protocol (UP.01.01.01). Time: 5284  Description of Procedure:          Target Area: Inferior, posterior, aspect of the sacroiliac fissure Approach: Posterior, paraspinal, ipsilateral approach. Area Prepped: Entire Lower Lumbosacral Region Prepping solution: DuraPrep (Iodine Povacrylex [0.7% available iodine] and Isopropyl Alcohol, 74% w/w) Safety Precautions: Aspiration looking for blood return was conducted prior to all injections. At no point did we inject any substances, as a needle was being advanced. No attempts were made at seeking any paresthesias. Safe injection practices and needle disposal techniques used. Medications properly checked for expiration dates. SDV (single dose vial) medications used. Description of the Procedure: Protocol guidelines were followed. The patient was placed in position over the procedure table. The target area was identified and the area prepped in the usual manner. Skin & deeper tissues infiltrated with local anesthetic. Appropriate amount of time allowed to pass for local anesthetics to take effect. The procedure needle was advanced under fluoroscopic guidance into the sacroiliac joint until a  firm endpoint was obtained. Proper needle placement secured. Negative aspiration confirmed. Solution injected in intermittent fashion, asking for systemic symptoms every 0.5cc of injectate. The needles were then removed and the area cleansed, making sure to leave some of the prepping solution back to take advantage of its long term bactericidal properties. Vitals:   10/18/18 0910 10/18/18 0918 10/18/18 0930 10/18/18 0941  BP: (!) 148/77 (!) 121/53 128/67 117/65  Pulse: (!) 58     Resp: 16 16 16 16   Temp:  97.8 F (36.6 C)    TempSrc:      SpO2:  100% 99% 99%  Weight:      Height:        Start Time: 0857 hrs. End Time: 0907 hrs. Materials:  Needle(s) Type: Spinal Needle Gauge: 22G Length: 3.5-in Medication(s): Please see orders for medications and dosing details. 5 cc solution made of 4 cc of 0.2% ropivacaine, 1 cc of Decadron 10 mg/cc.  2.5 cc injected intra-articular, 2.5 cc injected periarticular. Imaging Guidance (Non-Spinal):          Type of Imaging Technique: Fluoroscopy Guidance (Non-Spinal) Indication(s): Assistance in needle guidance and placement for procedures requiring needle placement in or near specific anatomical locations not easily accessible without such assistance. Exposure Time: Please see nurses notes. Contrast: Before injecting any contrast, we confirmed that the patient did not have an allergy to iodine, shellfish, or radiological contrast. Once satisfactory needle placement was completed at the desired level, radiological contrast was injected. Contrast injected under live fluoroscopy. No contrast complications. See chart for type and volume of contrast used. Fluoroscopic Guidance: I was personally present during the use of fluoroscopy. "Tunnel Vision Technique" used to obtain the best possible view of the target area. Parallax error corrected before commencing the procedure. "Direction-depth-direction" technique used to introduce the needle under continuous pulsed  fluoroscopy. Once target was reached, antero-posterior, oblique, and lateral fluoroscopic projection used confirm needle placement in all planes. Images permanently stored in EMR. Interpretation: I personally interpreted the imaging intraoperatively. Adequate needle placement confirmed in multiple planes. Appropriate spread of contrast into desired area was observed. No evidence of afferent or efferent intravascular uptake. Permanent images saved into the patient's record.  Antibiotic Prophylaxis:   Anti-infectives (From admission, onward)   None     Indication(s): None identified  Post-operative Assessment:  Post-procedure Vital Signs:  Pulse/HCG Rate: (!) 5862 Temp: 97.8 F (36.6 C) Resp: 16 BP: 117/65 SpO2: 99 %  EBL: None  Complications: No immediate post-treatment complications observed by team, or reported by patient.  Note: The patient tolerated the entire procedure well. A repeat set of vitals were taken after the procedure and the patient was kept under observation following institutional policy, for this type of procedure. Post-procedural neurological assessment was performed, showing return to baseline, prior to discharge. The patient was provided with post-procedure discharge instructions, including a section on how to identify potential problems. Should any problems arise concerning this procedure, the patient was given instructions to immediately contact us, at any time, without hesitation. In any case, we plan to contact the patient by telephone for a follow-up status report regarding this interventional procedure.  Comments:  No additional relevant information.  Plan of Care  Orders:  Orders Placed This Encounter  Procedures  . DG PAIN [facility] C-ARM 1-60 MIN NO REPORT    Intraoperative interpretation by procedural physician at [place].    Standing Status:   Standing    Number of Occurrences:   1    Order Specific Question:   Reason for exam:     Answer:   Assistance in needle guidance and placement for procedures requiring needle placement in or near specific anatomical locations not easily accessible without such assistance.    Medications ordered for procedure: Meds ordered this encounter  Medications  . iohexol (OMNIPAQUE) 180 MG/ML injection 10 mL    Must be Myelogram-compatible. If not available, you  may substitute with a water-soluble, non-ionic, hypoallergenic, myelogram-compatible radiological contrast medium.  Marland Kitchen lidocaine (XYLOCAINE) 2 % (with pres) injection 400 mg  . fentaNYL (SUBLIMAZE) injection 25-50 mcg    Make sure Narcan is available in the pyxis when using this medication. In the event of respiratory depression (RR< 8/min): Titrate NARCAN (naloxone) in increments of 0.1 to 0.2 mg IV at 2-3 minute intervals, until desired degree of reversal.  . ropivacaine (PF) 2 mg/mL (0.2%) (NAROPIN) injection 1 mL  . dexamethasone (DECADRON) injection 10 mg  . dexamethasone (DECADRON) injection 10 mg   Medications administered: We administered iohexol, lidocaine, fentaNYL, ropivacaine (PF) 2 mg/mL (0.2%), dexamethasone, and dexamethasone.  See the medical record for exact dosing, route, and time of administration.  Follow-up plan:   Return Keep appt for next month, for Keep appt for next month.       Recent Visits Date Type Provider Dept  10/04/18 Office Visit Gillis Santa, MD [facility]  09/06/18 Procedure visit Gillis Santa, MD [facility]  08/23/18 Procedure visit Gillis Santa, MD [facility]  08/19/18 Office Visit Gillis Santa, MD [facility]  Showing recent visits within past 90 days and meeting all other requirements   Today's Visits Date Type Provider Dept  10/18/18 Procedure visit Gillis Santa, MD [facility]  Showing today's visits and meeting all other requirements   Future Appointments Date Type Provider Dept  11/18/18 Appointment Gillis Santa, MD  [facility]  Showing future appointments within next 90 days and meeting all other requirements   Disposition: Discharge home  Discharge Date & Time: 10/18/2018; 0941 hrs.   Primary Care Physician: Juline Patch, MD Location: [facility] Note by: Gillis Santa, MD Date: 10/18/2018; Time: 9:46 AM  Disclaimer:  Medicine is not an exact science. The only guarantee in medicine is that nothing is guaranteed. It is important to note that the decision to proceed with this intervention was based on the information collected from the patient. The Data and conclusions were drawn from the patient's questionnaire, the interview, and the physical examination. Because the information was provided in large part by the patient, it cannot be guaranteed that it has not been purposely or unconsciously manipulated. Every effort has been made to obtain as much relevant data as possible for this evaluation. It is important to note that the conclusions that lead to this procedure are derived in large part from the available data. Always take into account that the treatment will also be dependent on availability of resources and existing treatment guidelines, considered by other Pain Management Practitioners as being common knowledge and practice, at the time of the intervention. For Medico-Legal purposes, it is also important to point out that variation in procedural techniques and pharmacological choices are the acceptable norm. The indications, contraindications, technique, and results of the above procedure should only be interpreted and judged by a Board-Certified Interventional Pain Specialist with extensive familiarity and expertise in the same exact procedure and technique.

## 2018-10-18 NOTE — Progress Notes (Signed)
Safety precautions to be maintained throughout the outpatient stay will include: orient to surroundings, keep bed in low position, maintain call bell within reach at all times, provide assistance with transfer out of bed and ambulation.  

## 2018-10-18 NOTE — Patient Instructions (Signed)

## 2018-10-19 ENCOUNTER — Encounter: Payer: Self-pay | Admitting: Family Medicine

## 2018-10-19 ENCOUNTER — Telehealth: Payer: Self-pay

## 2018-10-19 ENCOUNTER — Ambulatory Visit (INDEPENDENT_AMBULATORY_CARE_PROVIDER_SITE_OTHER): Payer: Medicare Other | Admitting: Family Medicine

## 2018-10-19 VITALS — BP 120/80 | HR 64 | Ht 71.0 in | Wt 260.0 lb

## 2018-10-19 DIAGNOSIS — Z09 Encounter for follow-up examination after completed treatment for conditions other than malignant neoplasm: Secondary | ICD-10-CM | POA: Diagnosis not present

## 2018-10-19 DIAGNOSIS — E039 Hypothyroidism, unspecified: Secondary | ICD-10-CM | POA: Diagnosis not present

## 2018-10-19 DIAGNOSIS — Z23 Encounter for immunization: Secondary | ICD-10-CM

## 2018-10-19 DIAGNOSIS — R7401 Elevation of levels of liver transaminase levels: Secondary | ICD-10-CM

## 2018-10-19 DIAGNOSIS — R740 Nonspecific elevation of levels of transaminase and lactic acid dehydrogenase [LDH]: Secondary | ICD-10-CM | POA: Diagnosis not present

## 2018-10-19 NOTE — Telephone Encounter (Signed)
The patient called back wanting to let you know that she is hurting real bad and cant hardly walk.

## 2018-10-19 NOTE — Telephone Encounter (Signed)
Attempted to call patient back.  No answer.

## 2018-10-19 NOTE — Telephone Encounter (Signed)
Called patient on other phone number listed and was unable to leave a message.

## 2018-10-19 NOTE — Telephone Encounter (Signed)
Post procedure phone call.   

## 2018-10-19 NOTE — Progress Notes (Signed)
Date:  10/19/2018   Name:  Michele Moore   DOB:  December 13, 1950   MRN:  [id number]   Chief Complaint: Follow-up (thyroid and liver needs to be checked- taking 257mcg daily)  Patient is a 68 year old female who presents for a hospital discharge exam. The patient reports the following problems: hypothyroid/. Health maintenance has been reviewed influenza. Thyroid Problem Presents for follow-up visit. Patient reports no anxiety, cold intolerance, constipation, diarrhea, fatigue, heat intolerance or menstrual problem.    Review of Systems  Constitutional: Negative.  Negative for chills, fatigue, fever and unexpected weight change.  HENT: Negative for congestion, ear discharge, ear pain, rhinorrhea, sinus pressure, sneezing and sore throat.   Eyes: Negative for photophobia, pain, discharge, redness and itching.  Respiratory: Negative for cough, shortness of breath, wheezing and stridor.   Gastrointestinal: Negative for abdominal pain, blood in stool, constipation, diarrhea, nausea and vomiting.  Endocrine: Negative for cold intolerance, heat intolerance, polydipsia, polyphagia and polyuria.  Genitourinary: Negative for dysuria, flank pain, frequency, hematuria, menstrual problem, pelvic pain, urgency, vaginal bleeding and vaginal discharge.  Musculoskeletal: Negative for arthralgias, back pain and myalgias.  Skin: Negative for rash.  Allergic/Immunologic: Negative for environmental allergies and food allergies.  Neurological: Negative for dizziness, weakness, light-headedness, numbness and headaches.  Hematological: Negative for adenopathy. Does not bruise/bleed easily.  Psychiatric/Behavioral: Negative for dysphoric mood. The patient is not nervous/anxious.     Patient Active Problem List   Diagnosis Date Noted   Trochanteric bursitis of left hip 10/04/2018   SI joint arthritis (left) 10/04/2018   Exertional chest pain 09/30/2018   CKD (chronic kidney disease), stage III ([place]) 09/30/2018    Female pelvic pain 01/20/2018   History of revision of total knee arthroplasty 01/20/2018   Iron deficiency anemia    Heme + stool    Acute gastritis without hemorrhage    Gastric polyps    Benign neoplasm of ascending colon    Benign neoplasm of transverse colon    Polyp of sigmoid colon    Hypertension 10/01/2017   Hypothyroidism 10/01/2017   Gastroesophageal reflux disease 10/01/2017   Hypokalemia 10/01/2017   Mixed hyperlipidemia 10/01/2017   Reactive airway disease 10/01/2017   Bradycardia 08/24/2017   Severe obesity (BMI 35.0-35.9 with comorbidity) ([place]) 08/18/2017   Chronic gouty arthropathy without tophi 06/25/2017   Encounter for long-term (current) use of high-risk medication 06/25/2017   Positive ANA (antinuclear antibody) 06/17/2017   Cervical facet syndrome 06/16/2017   Primary osteoarthritis of left shoulder 06/16/2017   Sprain of anterior talofibular ligament of right ankle 06/16/2017   Lumbar radiculopathy 04/21/2017   Lumbar degenerative disc disease 04/21/2017   Chronic pain syndrome 04/21/2017   Spinal stenosis, lumbar region, with neurogenic claudication 04/21/2017   SS-A antibody positive 03/05/2017   Pain, lower extremity 08/03/2015   DOE (dyspnea on exertion) 12/28/2013    Allergies  Allergen Reactions   Ampicillin Swelling   Penicillins Anaphylaxis and Swelling    Has patient had a PCN reaction causing immediate rash, facial/tongue/throat swelling, SOB or lightheadedness with hypotension: Yes Has patient had a PCN reaction causing severe rash involving mucus membranes or skin necrosis: No Has patient had a PCN reaction that required hospitalization: No Has patient had a PCN reaction occurring within the last 10 years: Yes If all of the above answers are "NO", then may proceed with Cephalosporin use.   Vibramycin [Doxycycline Calcium] Rash    Past Surgical History:  Procedure Laterality Date   ANKLE SURGERY  CARPAL TUNNEL RELEASE     x3   COLONOSCOPY  2000?   COLONOSCOPY WITH PROPOFOL N/A 10/22/2017   Procedure: COLONOSCOPY WITH PROPOFOL;  Surgeon: Lucilla Lame, MD;  Location: [facility];  Service: Endoscopy;  Laterality: N/A;   ESOPHAGOGASTRODUODENOSCOPY (EGD) WITH PROPOFOL N/A 10/22/2017   Procedure: ESOPHAGOGASTRODUODENOSCOPY (EGD) WITH PROPOFOL;  Surgeon: Lucilla Lame, MD;  Location: [facility];  Service: Endoscopy;  Laterality: N/A;   FUSION OF TALONAVICULAR JOINT Right 08/03/2015   Procedure: TAILOR NAVICULAR JOINT FUSION - RIGHT ;  Surgeon: [name], DPM;  Location: [facility];  Service: Podiatry;  Laterality: Right;   JOINT REPLACEMENT     knee x 3 ,right x1 and left x2   POLYPECTOMY N/A 10/22/2017   Procedure: POLYPECTOMY;  Surgeon: Lucilla Lame, MD;  Location: [place];  Service: Endoscopy;  Laterality: N/A;   ROTATOR CUFF REPAIR     TONSILLECTOMY     TOTAL KNEE REVISION Right 01/20/2018   Procedure: POLYETHYLENE EXCHANGE;  Surgeon: Dereck Leep, MD;  Location: [facility];  Service: Orthopedics;  Laterality: Right;   TUBAL LIGATION     UPPER GI ENDOSCOPY  2000?    Social History   Tobacco Use   Smoking status: Former Smoker    Quit date: 02/24/1993    Years since quitting: 25.6   Smokeless tobacco: Never Used  Substance Use Topics   Alcohol use: No    Alcohol/week: 0.0 standard drinks   Drug use: No     Medication list has been reviewed and updated.  Current Meds  Medication Sig   albuterol (PROVENTIL HFA;VENTOLIN HFA) 108 (90 Base) MCG/ACT inhaler Inhale 2 puffs into the lungs every 4 (four) hours as needed for wheezing.   allopurinol (ZYLOPRIM) 100 MG tablet Take 200 mg by mouth daily.    Cholecalciferol (VITAMIN D3) 2000 units TABS Take 2,000 Units by mouth daily.   clotrimazole (LOTRIMIN) 1 % cream Apply to affected area 2 times daily (Patient taking differently: Apply 1 application topically 2 (two) times daily. )    diclofenac sodium (VOLTAREN) 1 % GEL Apply 2 g topically 3 (three) times daily.    ferrous sulfate 325 (65 FE) MG tablet Take 325 mg by mouth daily with breakfast.    fluticasone (FLONASE) 50 MCG/ACT nasal spray SHAKE LIQUID AND USE 1 SPRAY IN EACH NOSTRIL DAILY (Patient taking differently: Place 1 spray into both nostrils daily. )   gabapentin (NEURONTIN) 600 MG tablet Take 1 tablet (600 mg total) by mouth at bedtime.   hydrALAZINE (APRESOLINE) 50 MG tablet Take 1 tablet (50 mg total) by mouth 2 (two) times daily.   HYDROcodone-acetaminophen (NORCO) 7.5-325 MG tablet Take 1 tablet by mouth every 8 (eight) hours as needed for up to 30 days for severe pain. Must last 30 days.   levothyroxine (SYNTHROID) 200 MCG tablet TAKE 1 TABLET(200 MCG) BY MOUTH DAILY BEFORE BREAKFAST (Patient taking differently: Take 200 mcg by mouth daily before breakfast. Take with 25 mcg tablet for a total of 225 mcg dose)   losartan (COZAAR) 25 MG tablet Take 25 mg by mouth daily.   meclizine (ANTIVERT) 12.5 MG tablet Take 1 tablet (12.5 mg total) by mouth 2 (two) times daily as needed for dizziness.   methotrexate (RHEUMATREX) 2.5 MG tablet Take 20 mg by mouth every Friday.   Multiple Vitamins-Calcium (ONE-A-DAY WOMENS FORMULA PO) Take 1 tablet by mouth daily.   omega-3 acid ethyl esters (LOVAZA) 1 g capsule Take 1 capsule (1 g  total) by mouth 2 (two) times daily.   omeprazole (PRILOSEC) 40 MG capsule TAKE 1 CAPSULE(40 MG) BY MOUTH DAILY (Patient taking differently: Take 40 mg by mouth daily. )   Polyethyl Glycol-Propyl Glycol (SYSTANE OP) Place 1 drop into both eyes daily as needed (dry eyes).    potassium chloride SA (K-DUR) 20 MEQ tablet Take 0.5 tablets (10 mEq total) by mouth daily.   Propylene Glycol (SYSTANE BALANCE OP) Apply 1 drop to eye 2 (two) times daily.   SYNTHROID 25 MCG tablet TAKE 1 TABLET BY MOUTH EVERY DAY BEFORE BREAKFAST. TAKE WITH 200 MCG TABLETS FOR TOTAL OF 225 MCG. (Patient taking  differently: Take 25 mcg by mouth daily before breakfast. Take with 200 mcg tablet for a total of 225 mcg dose)   tiZANidine (ZANAFLEX) 4 MG tablet Take 4 mg by mouth 2 (two) times daily.   triamcinolone cream (KENALOG) 0.1 % Apply 1 application topically every morning.   triamterene-hydrochlorothiazide (MAXZIDE) 75-50 MG tablet Take 1 tablet by mouth daily.    PHQ 2/9 Scores 07/14/2018 03/30/2018 03/01/2018 12/29/2017  PHQ - 2 Score 0 0 0 0  PHQ- 9 Score 0 - - -    BP Readings from Last 3 Encounters:  10/19/18 120/80  10/18/18 117/65  10/02/18 130/60    Physical Exam Vitals signs and nursing note reviewed.  Constitutional:      Appearance: She is well-developed. She is obese.  HENT:     Head: Normocephalic.     Right Ear: Tympanic membrane, ear canal and external ear normal.     Left Ear: Tympanic membrane, ear canal and external ear normal.     Nose: Nose normal. No congestion or rhinorrhea.     Mouth/Throat:     Mouth: Mucous membranes are moist.  Eyes:     General: Lids are everted, no foreign bodies appreciated. No scleral icterus.       Left eye: No foreign body or hordeolum.     Conjunctiva/sclera: Conjunctivae normal.     Right eye: Right conjunctiva is not injected.     Left eye: Left conjunctiva is not injected.     Pupils: Pupils are equal, round, and reactive to light.  Neck:     Musculoskeletal: Normal range of motion and neck supple.     Thyroid: No thyromegaly.     Vascular: No JVD.     Trachea: No tracheal deviation.  Cardiovascular:     Rate and Rhythm: Normal rate and regular rhythm.     Heart sounds: Normal heart sounds. No murmur. No friction rub. No gallop.   Pulmonary:     Effort: Pulmonary effort is normal. No respiratory distress.     Breath sounds: Normal breath sounds. No wheezing, rhonchi or rales.  Abdominal:     General: Bowel sounds are normal.     Palpations: Abdomen is soft. There is no mass.     Tenderness: There is no abdominal  tenderness. There is no guarding or rebound.  Musculoskeletal: Normal range of motion.        General: No tenderness.  Lymphadenopathy:     Cervical: No cervical adenopathy.  Skin:    General: Skin is warm.     Capillary Refill: Capillary refill takes less than 2 seconds.     Findings: No rash.  Neurological:     Mental Status: She is alert and oriented to person, place, and time.     Cranial Nerves: No cranial nerve deficit.  Motor: No weakness.     Deep Tendon Reflexes: Reflexes normal.  Psychiatric:        Mood and Affect: Mood is not anxious or depressed.     Wt Readings from Last 3 Encounters:  10/19/18 260 lb (117.9 kg)  10/18/18 262 lb (118.8 kg)  10/01/18 262 lb 8 oz (119.1 kg)    BP 120/80    Pulse 64    Ht 5\' 11"  (1.803 m)    Wt 260 lb (117.9 kg)    BMI 36.26 kg/m   Assessment and Plan: 1. Hospital discharge follow-up Hospital discharge for atypical chest pain.  Patient underwent stress test.  Patient is not been having any chest pain since hospitalization.  Chest pain.  Presumably secondary to chest wall pain or GI source.  2. Hypothyroidism, unspecified type Patient with history of hypothyroidism that is controlled.  We will check a TSH.  Patient is currently on levothyroxine 225 mcg. - TSH  3. Elevated transaminase level Patient with elevated transaminase levels presumably from excessive Tylenol intake for hip pain.  Patient has been informed about the inappropriate dosing of Tylenol and will be more careful in the future.  We will recheck a hepatic function panel to see if there is a return to normal of her transaminases. - Hepatic Function Panel (6)  4. Influenza vaccine needed Discussed and administered.  5.  Essential hypertension- patient will resume I hydralazine, losartan, triamterene/hydrochlorothiazide.  Blood pressure was noted to be in normal range today.

## 2018-10-20 LAB — HEPATIC FUNCTION PANEL (6)
ALT: 78 IU/L — ABNORMAL HIGH (ref 0–32)
AST: 75 IU/L — ABNORMAL HIGH (ref 0–40)
Albumin: 4.5 g/dL (ref 3.8–4.8)
Alkaline Phosphatase: 82 IU/L (ref 39–117)
Bilirubin Total: 0.3 mg/dL (ref 0.0–1.2)
Bilirubin, Direct: 0.11 mg/dL (ref 0.00–0.40)

## 2018-10-20 LAB — TSH: TSH: 0.03 u[IU]/mL — ABNORMAL LOW (ref 0.450–4.500)

## 2018-10-20 NOTE — Telephone Encounter (Signed)
Attempted to call, message left. 

## 2018-10-29 ENCOUNTER — Other Ambulatory Visit: Payer: Self-pay | Admitting: Student in an Organized Health Care Education/Training Program

## 2018-11-03 DIAGNOSIS — Z872 Personal history of diseases of the skin and subcutaneous tissue: Secondary | ICD-10-CM | POA: Diagnosis not present

## 2018-11-03 DIAGNOSIS — L820 Inflamed seborrheic keratosis: Secondary | ICD-10-CM | POA: Diagnosis not present

## 2018-11-03 DIAGNOSIS — R809 Proteinuria, unspecified: Secondary | ICD-10-CM | POA: Diagnosis not present

## 2018-11-03 DIAGNOSIS — L821 Other seborrheic keratosis: Secondary | ICD-10-CM | POA: Diagnosis not present

## 2018-11-03 DIAGNOSIS — D631 Anemia in chronic kidney disease: Secondary | ICD-10-CM | POA: Diagnosis not present

## 2018-11-03 DIAGNOSIS — L408 Other psoriasis: Secondary | ICD-10-CM | POA: Diagnosis not present

## 2018-11-03 DIAGNOSIS — I10 Essential (primary) hypertension: Secondary | ICD-10-CM | POA: Diagnosis not present

## 2018-11-03 DIAGNOSIS — N183 Chronic kidney disease, stage 3 (moderate): Secondary | ICD-10-CM | POA: Diagnosis not present

## 2018-11-03 DIAGNOSIS — N2581 Secondary hyperparathyroidism of renal origin: Secondary | ICD-10-CM | POA: Diagnosis not present

## 2018-11-17 ENCOUNTER — Encounter: Payer: Self-pay | Admitting: Student in an Organized Health Care Education/Training Program

## 2018-11-18 ENCOUNTER — Encounter: Payer: Self-pay | Admitting: Student in an Organized Health Care Education/Training Program

## 2018-11-18 ENCOUNTER — Telehealth: Payer: Self-pay | Admitting: *Deleted

## 2018-11-18 ENCOUNTER — Other Ambulatory Visit: Payer: Self-pay

## 2018-11-18 ENCOUNTER — Ambulatory Visit
Payer: Medicare Other | Attending: Student in an Organized Health Care Education/Training Program | Admitting: Student in an Organized Health Care Education/Training Program

## 2018-11-18 DIAGNOSIS — M5416 Radiculopathy, lumbar region: Secondary | ICD-10-CM

## 2018-11-18 DIAGNOSIS — M47816 Spondylosis without myelopathy or radiculopathy, lumbar region: Secondary | ICD-10-CM

## 2018-11-18 DIAGNOSIS — M47818 Spondylosis without myelopathy or radiculopathy, sacral and sacrococcygeal region: Secondary | ICD-10-CM | POA: Diagnosis not present

## 2018-11-18 DIAGNOSIS — M7062 Trochanteric bursitis, left hip: Secondary | ICD-10-CM | POA: Diagnosis not present

## 2018-11-18 DIAGNOSIS — M47812 Spondylosis without myelopathy or radiculopathy, cervical region: Secondary | ICD-10-CM

## 2018-11-18 DIAGNOSIS — M19012 Primary osteoarthritis, left shoulder: Secondary | ICD-10-CM | POA: Diagnosis not present

## 2018-11-18 DIAGNOSIS — G894 Chronic pain syndrome: Secondary | ICD-10-CM

## 2018-11-18 DIAGNOSIS — M5136 Other intervertebral disc degeneration, lumbar region: Secondary | ICD-10-CM | POA: Diagnosis not present

## 2018-11-18 MED ORDER — HYDROCODONE-ACETAMINOPHEN 7.5-325 MG PO TABS: 1.0000 | ORAL_TABLET | Freq: Three times a day (TID) | ORAL | 0 refills | Status: DC | PRN

## 2018-11-18 MED ORDER — TIZANIDINE HCL 4 MG PO TABS: 4.0000 mg | ORAL_TABLET | Freq: Two times a day (BID) | ORAL | 2 refills | Status: DC | PRN

## 2018-11-18 MED ORDER — GABAPENTIN 600 MG PO TABS: 600.0000 mg | ORAL_TABLET | Freq: Every day | ORAL | 2 refills | Status: DC

## 2018-11-18 NOTE — Progress Notes (Signed)
Pain Management Virtual Encounter Note - Virtual Visit via [place] (real-time audio visits between healthcare provider and patient).   Patient's Phone No. & Preferred Pharmacy:  [phone] (home); [phone] (mobile); (Preferred) [phone] No e-mail address on record  [facility], [address] [place] [address] Phone: [phone] Fax: [phone]    Pre-screening note:  Our staff contacted Michele Moore and offered her an "in person", "face-to-face" appointment versus a telephone encounter. She indicated preferring the telephone encounter, at this time.   Reason for Virtual Visit: COVID-19*  Social distancing based on CDC and AMA recommendations.   I contacted Michele Moore on 11/18/2018 via video conference.      I clearly identified myself as Gillis Santa, MD. I verified that I was speaking with the correct person using two identifiers (Name: Michele Moore, and date of birth: 01-15-1951).  Advanced Informed Consent I sought verbal advanced consent from Michele Moore for virtual visit interactions. I informed Michele Moore of possible security and privacy concerns, risks, and limitations associated with providing "not-in-person" medical evaluation and management services. I also informed Michele Moore of the availability of "in-person" appointments. Finally, I informed her that there would be a charge for the virtual visit and that she could be  personally, fully or partially, financially responsible for it. Michele Moore expressed understanding and agreed to proceed.   Historic Elements   Michele Moore is a 68 y.o. year old, female patient evaluated today after her last encounter by our practice on 10/29/2018. Michele Moore  has a past medical history of Allergy, Anemia, Arthritis, Asthma, Chronic kidney disease, Collagen vascular disease ([place]), Family history of adverse reaction to anesthesia, GERD  (gastroesophageal reflux disease), H/O tooth extraction, Hemorrhoid, History of hiatal hernia, Hypertension, Hypothyroidism, Migraines, Mixed hyperlipidemia, Multiple gastric ulcers, Thyroid disease, and Wears dentures. She also  has a past surgical history that includes Carpal tunnel release; Rotator cuff repair; Ankle surgery; Tubal ligation; Colonoscopy (2000?); Upper gi endoscopy (2000?); Tonsillectomy; Fusion of talonavicular joint (Right, 08/03/2015); Colonoscopy with propofol (N/A, 10/22/2017); Esophagogastroduodenoscopy (egd) with propofol (N/A, 10/22/2017); polypectomy (N/A, 10/22/2017); Joint replacement; and Total knee revision (Right, 01/20/2018). [name] has a current medication list which includes the following prescription(s): albuterol, allopurinol, aspirin, vitamin d3, diclofenac sodium, ferrous sulfate, fluticasone, gabapentin, hydralazine, levothyroxine, meclizine, methotrexate, multiple vitamins-calcium, omega-3 acid ethyl esters, omeprazole, polyethyl glycol-propyl glycol, potassium chloride sa, synthroid, tizanidine, triamcinolone cream, triamterene-hydrochlorothiazide, hydrocodone-acetaminophen, hydrocodone-acetaminophen, hydrocodone-acetaminophen, losartan, and propylene glycol. She  reports that she quit smoking about 25 years ago. She has never used smokeless tobacco. She reports that she does not drink alcohol or use drugs. Michele Moore is allergic to ampicillin; penicillins; and vibramycin [doxycycline calcium].   HPI  Today, she is being contacted for medication management.  Pharmacotherapy Assessment  Analgesic:  11/02/2018  1   08/19/2018  Hydrocodone-Acetamin 7.5-325  90.00  30 Bi Lat   [id number]   Wal (4612)   0  22.50 MME  Comm Ins   [place]    Monitoring: Pharmacotherapy: No side-effects or adverse reactions reported. [place] PMP: PDMP reviewed during this encounter.       Compliance: No problems identified. Effectiveness: Clinically acceptable. Plan: Refer to "POC".  UDS:  Summary   Date Value Ref Range Status  03/30/2018 FINAL  Final    Comment:    ==================================================================== TOXASSURE SELECT 13 (MW) ==================================================================== Test  Result       Flag       Units Drug Present and Declared for Prescription Verification   Hydrocodone                    824          EXPECTED   ng/mg creat   Hydromorphone                  233          EXPECTED   ng/mg creat   Dihydrocodeine                 100          EXPECTED   ng/mg creat   Norhydrocodone                 545          EXPECTED   ng/mg creat    Sources of hydrocodone include scheduled prescription    medications. Hydromorphone, dihydrocodeine and norhydrocodone are    expected metabolites of hydrocodone. Hydromorphone and    dihydrocodeine are also available as scheduled prescription    medications. ==================================================================== Test                      Result    Flag   Units      Ref Range   Creatinine              127              mg/dL      >=20 ==================================================================== Declared Medications:  The flagging and interpretation on this report are based on the  following declared medications.  Unexpected results may arise from  inaccuracies in the declared medications.  **Note: The testing scope of this panel includes these medications:  Hydrocodone (Norco)  **Note: The testing scope of this panel does not include following  reported medications:  Acetaminophen (Norco)  Albuterol  Allopurinol  Calcium  Cholecalciferol  Diclofenac  Fluticasone  Gabapentin  Hydralazine  Hydrochlorothiazide (Maxzide)  Iron (Ferrous Sulfate)  Ketoconazole ([place])  Levothyroxine (Synthroid)  Meclizine (Antivert)  Multivitamin  Nystatin (Mycostatin)  Omega-3 Fatty Acids ([place]aza)  Omeprazole (Prilosec)  Polyethylene Glycol   Potassium (K-Dur)  Ranitidine (Zantac)  Tizanidine (Zanaflex)  Triamcinolone (Kenalog)  Triamterene (Maxzide)  Undefined Miscellaneous Drug ==================================================================== For clinical consultation, please call [phone]. ====================================================================    Laboratory Chemistry Profile (12 mo)  Renal: 07/14/2018: BUN/Creatinine Ratio 15 10/01/2018: BUN 26; Creatinine, Ser 1.46  Lab Results  Component Value Date   GFRAA 42 (L) 10/01/2018   GFRNONAA 36 (L) 10/01/2018   Hepatic: 10/19/2018: Albumin 4.5 Lab Results  Component Value Date   AST 75 (H) 10/19/2018   ALT 78 (H) 10/19/2018   Other: No results found for requested labs within last 8760 hours. Note: Above Lab results reviewed.   Assessment  The primary encounter diagnosis was Trochanteric bursitis of left hip. Diagnoses of Primary osteoarthritis of left shoulder, SI joint arthritis (left), Lumbar degenerative disc disease, Lumbar radiculopathy, Cervical spondylosis without myelopathy, Lumbar spondylosis, Cervical facet syndrome, and Chronic pain syndrome were also pertinent to this visit.  Plan of Care  I have discontinued Michele Moore clotrimazole. I have also changed her tiZANidine. Additionally, I am having her start on HYDROcodone-acetaminophen, HYDROcodone-acetaminophen, and HYDROcodone-acetaminophen. Lastly, I am having her maintain her Polyethyl Glycol-Propyl Glycol (SYSTANE OP), ferrous sulfate, Multiple Vitamins-Calcium (ONE-A-DAY WOMENS FORMULA PO), allopurinol, diclofenac  sodium, Vitamin D3, meclizine, albuterol, triamcinolone cream, Propylene Glycol (SYSTANE BALANCE OP), hydrALAZINE, omega-3 acid ethyl esters, omeprazole, potassium chloride SA, levothyroxine, triamterene-hydrochlorothiazide, fluticasone, Synthroid, methotrexate, losartan, ASPIRIN 81 PO, and gabapentin.  Had a discussion with patient regarding her chronic pain regimen.   Patient does have slightly elevated liver function tests.  I instructed her to not supplement with Tylenol outside of her hydrocodone.  I informed her that Norco has both hydrocodone and acetaminophen and by taking Norco 3 times a day she is getting 975 mg of Tylenol daily.  I still think that this is appropriate the context of her elevated LFTs so long as she is not exceeding 1 to 1.5 g of Tylenol daily.  Patient endorsed understanding.  I have also instructed the patient to continue her gabapentin as prescribed and to only take tizanidine as needed.  Do not recommend increasing tizanidine dose as this is hepatically eliminated.  Patient instructed to follow-up in 3 months.  Pharmacotherapy (Medications Ordered): Meds ordered this encounter  Medications  . gabapentin (NEURONTIN) 600 MG tablet    Sig: Take 1 tablet (600 mg total) by mouth at bedtime.    Dispense:  30 tablet    Refill:  2  . HYDROcodone-acetaminophen (NORCO) 7.5-325 MG tablet    Sig: Take 1 tablet by mouth every 8 (eight) hours as needed for severe pain. Must last 30 days.    Dispense:  90 tablet    Refill:  0    Chronic Pain. (STOP Act - Not applicable). Fill one day early if closed on scheduled refill date.  Marland Kitchen HYDROcodone-acetaminophen (NORCO) 7.5-325 MG tablet    Sig: Take 1 tablet by mouth every 8 (eight) hours as needed for severe pain. Must last 30 days.    Dispense:  90 tablet    Refill:  0    Chronic Pain. (STOP Act - Not applicable). Fill one day early if closed on scheduled refill date.  Marland Kitchen HYDROcodone-acetaminophen (NORCO) 7.5-325 MG tablet    Sig: Take 1 tablet by mouth every 8 (eight) hours as needed for severe pain. Must last 30 days.    Dispense:  90 tablet    Refill:  0    Chronic Pain. (STOP Act - Not applicable). Fill one day early if closed on scheduled refill date.  Marland Kitchen tiZANidine (ZANAFLEX) 4 MG tablet    Sig: Take 1 tablet (4 mg total) by mouth 2 (two) times daily as needed for muscle spasms.    Dispense:   60 tablet    Refill:  2   Follow-up plan:   Return in about 3 months (around 02/17/2019) for Medication Management, virtual.         Recent Visits Date Type Provider Dept  10/18/18 Procedure visit Gillis Santa, MD [facility]  10/04/18 Office Visit Gillis Santa, MD [facility]  09/06/18 Procedure visit Gillis Santa, MD [facility]  08/23/18 Procedure visit Gillis Santa, MD [facility]  Showing recent visits within past 90 days and meeting all other requirements   Today's Visits Date Type Provider Dept  11/18/18 Office Visit Gillis Santa, MD [facility]  Showing today's visits and meeting all other requirements   Future Appointments No visits were found meeting these conditions.  Showing future appointments within next 90 days and meeting all other requirements   I discussed the assessment and treatment plan with the patient. The patient was provided an opportunity to ask questions and all were answered. The patient agreed  with the plan and demonstrated an understanding of the instructions.  Patient advised to call back or seek an in-person evaluation if the symptoms or condition worsens.  Total duration of non-face-to-face encounter: 25 minutes.  Note by: Gillis Santa, MD Date: 11/18/2018; Time: 11:13 AM  Note: This dictation was prepared with Dragon dictation. Any transcriptional errors that may result from this process are unintentional.  Disclaimer:  * Given the special circumstances of the COVID-19 pandemic, the federal government has announced that the Office for Civil Rights (OCR) will exercise its enforcement discretion and will not impose penalties on physicians using telehealth in the event of noncompliance with regulatory requirements under the [place] (HIPAA) in connection with the good faith provision of telehealth during the YTRZN-35 national public health emergency.  ([place])

## 2018-11-22 DIAGNOSIS — L4050 Arthropathic psoriasis, unspecified: Secondary | ICD-10-CM | POA: Diagnosis not present

## 2018-11-22 DIAGNOSIS — L409 Psoriasis, unspecified: Secondary | ICD-10-CM | POA: Diagnosis not present

## 2018-11-22 DIAGNOSIS — R768 Other specified abnormal immunological findings in serum: Secondary | ICD-10-CM | POA: Diagnosis not present

## 2018-11-22 DIAGNOSIS — Z79899 Other long term (current) drug therapy: Secondary | ICD-10-CM | POA: Diagnosis not present

## 2018-11-22 DIAGNOSIS — M1A00X0 Idiopathic chronic gout, unspecified site, without tophus (tophi): Secondary | ICD-10-CM | POA: Diagnosis not present

## 2018-11-22 DIAGNOSIS — N183 Chronic kidney disease, stage 3 (moderate): Secondary | ICD-10-CM | POA: Diagnosis not present

## 2018-12-02 ENCOUNTER — Other Ambulatory Visit: Payer: Medicare Other

## 2018-12-02 DIAGNOSIS — R945 Abnormal results of liver function studies: Secondary | ICD-10-CM

## 2018-12-02 DIAGNOSIS — E039 Hypothyroidism, unspecified: Secondary | ICD-10-CM | POA: Diagnosis not present

## 2018-12-02 DIAGNOSIS — R7989 Other specified abnormal findings of blood chemistry: Secondary | ICD-10-CM

## 2018-12-03 LAB — HEPATIC FUNCTION PANEL
ALT: 24 IU/L (ref 0–32)
AST: 25 IU/L (ref 0–40)
Albumin: 4.2 g/dL (ref 3.8–4.8)
Alkaline Phosphatase: 88 IU/L (ref 39–117)
Bilirubin Total: 0.3 mg/dL (ref 0.0–1.2)
Bilirubin, Direct: 0.11 mg/dL (ref 0.00–0.40)
Total Protein: 6.3 g/dL (ref 6.0–8.5)

## 2018-12-03 LAB — TSH: TSH: 0.888 u[IU]/mL (ref 0.450–4.500)

## 2018-12-22 DIAGNOSIS — R748 Abnormal levels of other serum enzymes: Secondary | ICD-10-CM | POA: Diagnosis not present

## 2019-01-03 ENCOUNTER — Other Ambulatory Visit: Payer: Self-pay

## 2019-01-03 DIAGNOSIS — E039 Hypothyroidism, unspecified: Secondary | ICD-10-CM

## 2019-01-03 MED ORDER — LEVOTHYROXINE SODIUM 200 MCG PO TABS: ORAL_TABLET | ORAL | 1 refills | Status: DC

## 2019-01-05 ENCOUNTER — Telehealth: Payer: Self-pay | Admitting: Student in an Organized Health Care Education/Training Program

## 2019-01-05 NOTE — Telephone Encounter (Signed)
Tracie Castelo lvmail asking if Dr. Holley Raring will fill out FMLA Papers. She will bring them in today.

## 2019-01-05 NOTE — Telephone Encounter (Signed)
Michele Moore brought over [employer], also a copy of the last ones. Please call Michele Moore so she can come pick them up when they are finished.

## 2019-01-10 NOTE — Telephone Encounter (Signed)
Called Tracie to pick up FMLA papers. She will pick up 11-16 or 11-17

## 2019-01-10 NOTE — Telephone Encounter (Signed)
Tracie lvmail asking if she can pick up FMLA papers?  Please let her know.

## 2019-01-13 DIAGNOSIS — Z96651 Presence of right artificial knee joint: Secondary | ICD-10-CM | POA: Diagnosis not present

## 2019-01-13 DIAGNOSIS — M25562 Pain in left knee: Secondary | ICD-10-CM | POA: Diagnosis not present

## 2019-01-13 DIAGNOSIS — M25561 Pain in right knee: Secondary | ICD-10-CM | POA: Diagnosis not present

## 2019-01-13 DIAGNOSIS — Z96652 Presence of left artificial knee joint: Secondary | ICD-10-CM | POA: Diagnosis not present

## 2019-01-17 ENCOUNTER — Other Ambulatory Visit: Payer: Self-pay

## 2019-01-17 DIAGNOSIS — E782 Mixed hyperlipidemia: Secondary | ICD-10-CM

## 2019-01-17 MED ORDER — OMEGA-3-ACID ETHYL ESTERS 1 G PO CAPS: 1.0000 g | ORAL_CAPSULE | Freq: Two times a day (BID) | ORAL | 1 refills | Status: DC

## 2019-01-26 ENCOUNTER — Other Ambulatory Visit: Payer: Self-pay

## 2019-01-26 DIAGNOSIS — I10 Essential (primary) hypertension: Secondary | ICD-10-CM

## 2019-01-26 MED ORDER — HYDRALAZINE HCL 50 MG PO TABS: 50.0000 mg | ORAL_TABLET | Freq: Two times a day (BID) | ORAL | 0 refills | Status: DC

## 2019-02-04 ENCOUNTER — Ambulatory Visit (INDEPENDENT_AMBULATORY_CARE_PROVIDER_SITE_OTHER): Payer: Medicare Other | Admitting: Family Medicine

## 2019-02-04 ENCOUNTER — Encounter: Payer: Self-pay | Admitting: Family Medicine

## 2019-02-04 ENCOUNTER — Other Ambulatory Visit: Payer: Self-pay

## 2019-02-04 ENCOUNTER — Telehealth: Payer: Self-pay | Admitting: Family Medicine

## 2019-02-04 DIAGNOSIS — J0100 Acute maxillary sinusitis, unspecified: Secondary | ICD-10-CM | POA: Diagnosis not present

## 2019-02-04 DIAGNOSIS — R05 Cough: Secondary | ICD-10-CM

## 2019-02-04 DIAGNOSIS — R059 Cough, unspecified: Secondary | ICD-10-CM

## 2019-02-04 MED ORDER — AZITHROMYCIN 250 MG PO TABS: ORAL_TABLET | ORAL | 0 refills | Status: DC

## 2019-02-04 MED ORDER — BENZONATATE 100 MG PO CAPS: 100.0000 mg | ORAL_CAPSULE | Freq: Two times a day (BID) | ORAL | 0 refills | Status: DC | PRN

## 2019-02-04 NOTE — Progress Notes (Signed)
Date:  02/04/2019   Name:  Michele Moore   DOB:  07-10-1950   MRN:  [id number]   Chief Complaint: Sinusitis (coughing up green production, nasal production, eyes itching, nose bleeding/ scabs)  I connected withthis patient, Michele Moore, by telephoneat the patient's home.  I verified that I am speaking with the correct person using two identifiers. This visit was conducted via telephone due to the Covid-19 outbreak from my office at [facility] in [place], [place]. I discussed the limitations, risks, security and privacy concerns of performing an evaluation and management service by telephone. I also discussed with the patient that there may be a patient responsible charge related to this service. The patient expressed understanding and agreed to proceed.  Sinusitis This is a new problem. The current episode started 1 to 4 weeks ago. The problem has been gradually improving since onset. There has been no fever. The pain is moderate. Associated symptoms include congestion, coughing, a hoarse voice and sinus pressure. Pertinent negatives include no chills, diaphoresis, ear pain, headaches, neck pain, shortness of breath, sneezing, sore throat or swollen glands. (Bloody nasal drainage) Past treatments include nothing (robitussin).    Lab Results  Component Value Date   CREATININE 1.46 (H) 10/01/2018   BUN 26 (H) 10/01/2018   NA 140 10/01/2018   K 4.2 10/01/2018   CL 107 10/01/2018   CO2 25 10/01/2018   Lab Results  Component Value Date   CHOL 181 10/01/2018   HDL 43 10/01/2018   LDLCALC 111 (H) 10/01/2018   TRIG 133 10/01/2018   CHOLHDL 4.2 10/01/2018   Lab Results  Component Value Date   TSH 0.888 12/02/2018   Lab Results  Component Value Date   HGBA1C 5.4 10/01/2018     Review of Systems  Constitutional: Negative for chills and diaphoresis.  HENT: Positive for congestion, hoarse voice and sinus pressure. Negative for ear pain, sneezing and sore throat.   Respiratory:  Positive for cough. Negative for shortness of breath.   Musculoskeletal: Negative for neck pain.  Neurological: Negative for headaches.    Patient Active Problem List   Diagnosis Date Noted  . Trochanteric bursitis of left hip 10/04/2018  . SI joint arthritis (left) 10/04/2018  . Exertional chest pain 09/30/2018  . CKD (chronic kidney disease), stage III 09/30/2018  . Female pelvic pain 01/20/2018  . History of revision of total knee arthroplasty 01/20/2018  . Iron deficiency anemia   . Heme + stool   . Acute gastritis without hemorrhage   . Gastric polyps   . Benign neoplasm of ascending colon   . Benign neoplasm of transverse colon   . Polyp of sigmoid colon   . Hypertension 10/01/2017  . Hypothyroidism 10/01/2017  . Gastroesophageal reflux disease 10/01/2017  . Hypokalemia 10/01/2017  . Mixed hyperlipidemia 10/01/2017  . Reactive airway disease 10/01/2017  . Bradycardia 08/24/2017  . Severe obesity (BMI 35.0-35.9 with comorbidity) ([place]) 08/18/2017  . Chronic gouty arthropathy without tophi 06/25/2017  . Encounter for long-term (current) use of high-risk medication 06/25/2017  . Positive ANA (antinuclear antibody) 06/17/2017  . Cervical facet syndrome 06/16/2017  . Primary osteoarthritis of left shoulder 06/16/2017  . Sprain of anterior talofibular ligament of right ankle 06/16/2017  . Lumbar radiculopathy 04/21/2017  . Lumbar degenerative disc disease 04/21/2017  . Chronic pain syndrome 04/21/2017  . Spinal stenosis, lumbar region, with neurogenic claudication 04/21/2017  . SS-A antibody positive 03/05/2017  . Pain, lower extremity 08/03/2015  . DOE (  dyspnea on exertion) 12/28/2013    Allergies  Allergen Reactions  . Ampicillin Swelling  . Penicillins Anaphylaxis and Swelling    Has patient had a PCN reaction causing immediate rash, facial/tongue/throat swelling, SOB or lightheadedness with hypotension: Yes Has patient had a PCN reaction causing severe rash  involving mucus membranes or skin necrosis: No Has patient had a PCN reaction that required hospitalization: No Has patient had a PCN reaction occurring within the last 10 years: Yes If all of the above answers are "NO", then may proceed with Cephalosporin use.  . Vibramycin [Doxycycline Calcium] Rash    Past Surgical History:  Procedure Laterality Date  . ANKLE SURGERY    . CARPAL TUNNEL RELEASE     x3  . COLONOSCOPY  2000?  . COLONOSCOPY WITH PROPOFOL N/A 10/22/2017   Procedure: COLONOSCOPY WITH PROPOFOL;  Surgeon: Lucilla Lame, MD;  Location: [place];  Service: Endoscopy;  Laterality: N/A;  . ESOPHAGOGASTRODUODENOSCOPY (EGD) WITH PROPOFOL N/A 10/22/2017   Procedure: ESOPHAGOGASTRODUODENOSCOPY (EGD) WITH PROPOFOL;  Surgeon: Lucilla Lame, MD;  Location: [place];  Service: Endoscopy;  Laterality: N/A;  . FUSION OF TALONAVICULAR JOINT Right 08/03/2015   Procedure: TAILOR NAVICULAR JOINT FUSION - RIGHT ;  Surgeon: [name], DPM;  Location: [facility];  Service: Podiatry;  Laterality: Right;  . JOINT REPLACEMENT     knee x 3 ,right x1 and left x2  . POLYPECTOMY N/A 10/22/2017   Procedure: POLYPECTOMY;  Surgeon: Lucilla Lame, MD;  Location: [place];  Service: Endoscopy;  Laterality: N/A;  . ROTATOR CUFF REPAIR    . TONSILLECTOMY    . TOTAL KNEE REVISION Right 01/20/2018   Procedure: POLYETHYLENE EXCHANGE;  Surgeon: Dereck Leep, MD;  Location: [facility];  Service: Orthopedics;  Laterality: Right;  . TUBAL LIGATION    . UPPER GI ENDOSCOPY  2000?    Social History   Tobacco Use  . Smoking status: Former Smoker    Quit date: 02/24/1993    Years since quitting: 25.9  . Smokeless tobacco: Never Used  Substance Use Topics  . Alcohol use: No    Alcohol/week: 0.0 standard drinks  . Drug use: No     Medication list has been reviewed and updated.  Current Meds  Medication Sig  . albuterol (PROVENTIL HFA;VENTOLIN HFA) 108 (90 Base) MCG/ACT inhaler  Inhale 2 puffs into the lungs every 4 (four) hours as needed for wheezing.  Marland Kitchen allopurinol (ZYLOPRIM) 100 MG tablet Take 200 mg by mouth daily.   . ASPIRIN 81 PO Take 81 mg by mouth.  . Cholecalciferol (VITAMIN D3) 2000 units TABS Take 2,000 Units by mouth daily.  . diclofenac sodium (VOLTAREN) 1 % GEL Apply 2 g topically 3 (three) times daily.   . ferrous sulfate 325 (65 FE) MG tablet Take 325 mg by mouth daily with breakfast.   . fluticasone (FLONASE) 50 MCG/ACT nasal spray SHAKE LIQUID AND USE 1 SPRAY IN EACH NOSTRIL DAILY (Patient taking differently: Place 1 spray into both nostrils daily. )  . gabapentin (NEURONTIN) 600 MG tablet Take 1 tablet (600 mg total) by mouth at bedtime.  . hydrALAZINE (APRESOLINE) 50 MG tablet Take 1 tablet (50 mg total) by mouth 2 (two) times daily.  Marland Kitchen HYDROcodone-acetaminophen (NORCO) 7.5-325 MG tablet Take 1 tablet by mouth every 8 (eight) hours as needed for severe pain. Must last 30 days.  Marland Kitchen levothyroxine (SYNTHROID) 200 MCG tablet TAKE 1 TABLET(200 MCG) BY MOUTH DAILY BEFORE BREAKFAST  . losartan (COZAAR)  25 MG tablet Take 25 mg by mouth daily.  . meclizine (ANTIVERT) 12.5 MG tablet Take 1 tablet (12.5 mg total) by mouth 2 (two) times daily as needed for dizziness.  . methotrexate (RHEUMATREX) 2.5 MG tablet Take 20 mg by mouth every Friday.  . Multiple Vitamins-Calcium (ONE-A-DAY WOMENS FORMULA PO) Take 1 tablet by mouth daily.  Marland Kitchen omega-3 acid ethyl esters (LOVAZA) 1 g capsule Take 1 capsule (1 g total) by mouth 2 (two) times daily.  Marland Kitchen omeprazole (PRILOSEC) 40 MG capsule TAKE 1 CAPSULE(40 MG) BY MOUTH DAILY (Patient taking differently: Take 40 mg by mouth daily. )  . Polyethyl Glycol-Propyl Glycol (SYSTANE OP) Place 1 drop into both eyes daily as needed (dry eyes).   . potassium chloride SA (K-DUR) 20 MEQ tablet Take 0.5 tablets (10 mEq total) by mouth daily.  Marland Kitchen Propylene Glycol (SYSTANE BALANCE OP) Apply 1 drop to eye 2 (two) times daily.  Marland Kitchen tiZANidine  (ZANAFLEX) 4 MG tablet Take 1 tablet (4 mg total) by mouth 2 (two) times daily as needed for muscle spasms.  Marland Kitchen triamcinolone cream (KENALOG) 0.1 % Apply 1 application topically every morning.  . triamterene-hydrochlorothiazide (MAXZIDE) 75-50 MG tablet Take 1 tablet by mouth daily.    PHQ 2/9 Scores 02/04/2019 07/14/2018 03/30/2018 03/01/2018  PHQ - 2 Score 0 0 0 0  PHQ- 9 Score 0 0 - -    BP Readings from Last 3 Encounters:  10/19/18 120/80  10/18/18 117/65  10/02/18 130/60    Physical Exam  Wt Readings from Last 3 Encounters:  10/19/18 260 lb (117.9 kg)  10/18/18 262 lb (118.8 kg)  10/01/18 262 lb 8 oz (119.1 kg)    There were no vitals taken for this visit.  Assessment and Plan: 1. Acute non-recurrent maxillary sinusitis Televisit with patient who had green nasal drainage from the sinuses for the last 2 weeks.  Has also had a cough associated with this with no fever.  We will initiate a azithromycin to 50 mg 2 today followed by one a day for 4 days.  Patient has been instructed to go to a testing center if she starts running fever or having difficulty with breathing. - azithromycin (ZITHROMAX) 250 MG tablet; 2 today then 1 a day for 4 day  Dispense: 6 tablet; Refill: 0  2. Cough Patient is currently on hydrocodone for pain for musculoskeletal reasons by another physician and therefore we called in some Tessalon Perles 100 mg as needed cough. - benzonatate (TESSALON) 100 MG capsule; Take 1 capsule (100 mg total) by mouth 2 (two) times daily as needed for cough.  Dispense: 20 capsule; Refill: 0

## 2019-02-04 NOTE — Telephone Encounter (Signed)
Michele Moore complain of persistent cough, green mucus, chest congestion,wheezing,nasal drainage, no fever which have been going on since thanksgiving.

## 2019-02-14 ENCOUNTER — Encounter: Payer: Self-pay | Admitting: Student in an Organized Health Care Education/Training Program

## 2019-02-15 ENCOUNTER — Other Ambulatory Visit: Payer: Self-pay

## 2019-02-15 ENCOUNTER — Encounter: Payer: Self-pay | Admitting: Student in an Organized Health Care Education/Training Program

## 2019-02-15 ENCOUNTER — Ambulatory Visit
Payer: Medicare Other | Attending: Student in an Organized Health Care Education/Training Program | Admitting: Student in an Organized Health Care Education/Training Program

## 2019-02-15 DIAGNOSIS — M19112 Post-traumatic osteoarthritis, left shoulder: Secondary | ICD-10-CM

## 2019-02-15 DIAGNOSIS — S46002S Unspecified injury of muscle(s) and tendon(s) of the rotator cuff of left shoulder, sequela: Secondary | ICD-10-CM

## 2019-02-15 DIAGNOSIS — G894 Chronic pain syndrome: Secondary | ICD-10-CM

## 2019-02-15 DIAGNOSIS — M19012 Primary osteoarthritis, left shoulder: Secondary | ICD-10-CM | POA: Diagnosis not present

## 2019-02-15 DIAGNOSIS — M47812 Spondylosis without myelopathy or radiculopathy, cervical region: Secondary | ICD-10-CM | POA: Diagnosis not present

## 2019-02-15 MED ORDER — HYDROCODONE-ACETAMINOPHEN 7.5-325 MG PO TABS: 1.0000 | ORAL_TABLET | Freq: Three times a day (TID) | ORAL | 0 refills | Status: DC | PRN

## 2019-02-15 NOTE — Progress Notes (Signed)
Virtual Encounter - Pain Management PROVIDER NOTE: Information contained herein reflects review and annotations entered in association with encounter. Patient information is provided elsewhere in the medical record. Interpretation of information and data should be left to medically trained personnel. Document created using STT technology, any transcriptional errors that may result from process are unintentional.    Contact & Pharmacy Preferred: [place]: [phone] (home) Mobile: [phone] (mobile) E-mail: No e-mail address on record  [facility] [place] [address] Phone: [phone] Fax: [phone]   Pre-screening  Michele Moore offered "in-person" vs "virtual" encounter. She indicated preferring virtual for this encounter.   Reason COVID-19*  Social distancing based on CDC and AMA recommendations.   I contacted Michele Moore on 02/15/2019 via video conference.      I clearly identified myself as Gillis Santa, MD. I verified that I was speaking with the correct person using two identifiers (Name: Michele Moore, and date of birth: 06-Sep-1950).  Consent I sought verbal advanced consent from Michele Moore for virtual visit interactions. I informed Michele Moore of possible security and privacy concerns, risks, and limitations associated with providing "not-in-person" medical evaluation and management services. I also informed Michele Moore of the availability of "in-person" appointments. Finally, I informed her that there would be a charge for the virtual visit and that she could be  personally, fully or partially, financially responsible for it. Michele Moore expressed understanding and agreed to proceed.   Historic Elements   Michele Moore is a 68 y.o. year old, female patient evaluated today after her last encounter by our practice on 01/05/2019. Michele Moore  has a past medical history of Allergy,  Anemia, Arthritis, Asthma, Chronic kidney disease, Collagen vascular disease ([place]), Family history of adverse reaction to anesthesia, GERD (gastroesophageal reflux disease), H/O tooth extraction, Hemorrhoid, History of hiatal hernia, Hypertension, Hypothyroidism, Migraines, Mixed hyperlipidemia, Multiple gastric ulcers, Thyroid disease, and Wears dentures. She also  has a past surgical history that includes Carpal tunnel release; Rotator cuff repair; Ankle surgery; Tubal ligation; Colonoscopy (2000?); Upper gi endoscopy (2000?); Tonsillectomy; Fusion of talonavicular joint (Right, 08/03/2015); Colonoscopy with propofol (N/A, 10/22/2017); Esophagogastroduodenoscopy (egd) with propofol (N/A, 10/22/2017); polypectomy (N/A, 10/22/2017); Joint replacement; and Total knee revision (Right, 01/20/2018). Michele Moore has a current medication list which includes the following prescription(s): albuterol, allopurinol, aspirin, azithromycin, benzonatate, vitamin d3, diclofenac sodium, ferrous sulfate, fluticasone, gabapentin, hydralazine, [START ON 03/02/2019] hydrocodone-acetaminophen, [START ON 04/01/2019] hydrocodone-acetaminophen, [START ON 05/01/2019] hydrocodone-acetaminophen, ketoconazole, levothyroxine, losartan, meclizine, methotrexate, multiple vitamins-calcium, omega-3 acid ethyl esters, fish oil, omeprazole, polyethyl glycol-propyl glycol, potassium chloride sa, propylene glycol, tizanidine, triamcinolone cream, and triamterene-hydrochlorothiazide. She  reports that she quit smoking about 25 years ago. She has never used smokeless tobacco. She reports that she does not drink alcohol or use drugs. Michele Moore is allergic to ampicillin; penicillins; and vibramycin [doxycycline calcium].   HPI  Today, she is being contacted for medication management.   Has been dealing with URI, persistent cough since Thanksgiving.  Patient is complaining of left-sided neck pain and left shoulder pain.  She did have a cervical facet medial branch  nerve block at C4, C5, C6, C7 bilaterally in October 2019.  I recommend that we repeat this given her current pain symptoms.  She is also having difficulty with her shoulder.  She finds it difficult to engage in overhead reach.  She has  been told in the past that she requires rotator cuff surgery.  We discussed diagnostic left intra-articular steroid injection for her left shoulder pain as well.  Otherwise we will refill her medications as below.   Pharmacotherapy Assessment  Analgesic:  01/31/2019  1   11/18/2018  Hydrocodone-Acetamin 7.5-325  90.00  30 Bi Lat   [id number]   Wal (4612)   0  22.50 MME  Comm Ins   [address]     Monitoring: Pharmacotherapy: No side-effects or adverse reactions reported. [place] PMP: PDMP reviewed during this encounter.       Compliance: No problems identified. Effectiveness: Clinically acceptable. Plan: Refer to "POC".  UDS:  Summary  Date Value Ref Range Status  03/30/2018 FINAL  Final    Comment:    ==================================================================== TOXASSURE SELECT 13 (MW) ==================================================================== Test                             Result       Flag       Units Drug Present and Declared for Prescription Verification   Hydrocodone                    824          EXPECTED   ng/mg creat   Hydromorphone                  233          EXPECTED   ng/mg creat   Dihydrocodeine                 100          EXPECTED   ng/mg creat   Norhydrocodone                 545          EXPECTED   ng/mg creat    Sources of hydrocodone include scheduled prescription    medications. Hydromorphone, dihydrocodeine and norhydrocodone are    expected metabolites of hydrocodone. Hydromorphone and    dihydrocodeine are also available as scheduled prescription    medications. ==================================================================== Test                      Result    Flag   Units      Ref Range   Creatinine               127              mg/dL      >=20 ==================================================================== Declared Medications:  The flagging and interpretation on this report are based on the  following declared medications.  Unexpected results may arise from  inaccuracies in the declared medications.  **Note: The testing scope of this panel includes these medications:  Hydrocodone (Norco)  **Note: The testing scope of this panel does not include following  reported medications:  Acetaminophen (Norco)  Albuterol  Allopurinol  Calcium  Cholecalciferol  Diclofenac  Fluticasone  Gabapentin  Hydralazine  Hydrochlorothiazide (Maxzide)  Iron (Ferrous Sulfate)  Ketoconazole (Nizoral)  Levothyroxine (Synthroid)  Meclizine (Antivert)  Multivitamin  Nystatin (Mycostatin)  Omega-3 Fatty Acids (Lovaza)  Omeprazole (Prilosec)  Polyethylene Glycol  Potassium (K-Dur)  Ranitidine (Zantac)  Tizanidine (Zanaflex)  Triamcinolone (Kenalog)  Triamterene (Maxzide)  Undefined Miscellaneous Drug ==================================================================== For clinical consultation, please call [phone]. ====================================================================    Laboratory Chemistry Profile (12 mo)  Renal: 07/14/2018: BUN/Creatinine Ratio  15 10/01/2018: BUN 26; Creatinine, Ser 1.46  Lab Results  Component Value Date   GFRAA 42 (L) 10/01/2018   GFRNONAA 36 (L) 10/01/2018   Hepatic: 12/02/2018: Albumin 4.2 Lab Results  Component Value Date   AST 25 12/02/2018   ALT 24 12/02/2018   Other: No results found for requested labs within last 8760 hours. Note: Above Lab results reviewed.  Imaging  DG PAIN CLINIC C-ARM 1-60 MIN NO REPORT Fluoro was used, but no Radiologist interpretation will be provided.  Please refer to "NOTES" tab for provider progress note.   Assessment  The primary encounter diagnosis was Cervical spondylosis without myelopathy. Diagnoses of  Cervical facet syndrome, Primary osteoarthritis of left shoulder, Osteoarthritis of left shoulder due to rotator cuff injury, and Chronic pain syndrome were also pertinent to this visit.  Plan of Care  I am having Michele Moore start on HYDROcodone-acetaminophen and HYDROcodone-acetaminophen. I am also having her maintain her Polyethyl Glycol-Propyl Glycol (SYSTANE OP), ferrous sulfate, Multiple Vitamins-Calcium (ONE-A-DAY WOMENS FORMULA PO), allopurinol, diclofenac sodium, Vitamin D3, meclizine, albuterol, triamcinolone cream, Propylene Glycol (SYSTANE BALANCE OP), omeprazole, potassium chloride SA, triamterene-hydrochlorothiazide, fluticasone, methotrexate, losartan, ASPIRIN 81 PO, gabapentin, tiZANidine, levothyroxine, omega-3 acid ethyl esters, hydrALAZINE, azithromycin, benzonatate, ketoconazole, Fish Oil, and HYDROcodone-acetaminophen.  1. Cervical spondylosis without myelopathy -left C4,5, 6, 7 C-Fct block #2 (#1 done bilaterally at c4,5,6,7 in Oct 2019)  2. Cervical facet syndrome - C-FCT Blk (Schedule); Future  3. Primary osteoarthritis of left shoulder - Shoulder injection (Schedule); Future  4. Osteoarthritis of left shoulder due to rotator cuff injury -left shoulder injection  5. Chronic pain syndrome -continue multimodal analgesics inlcuding Voltaren gel, Gabapentin, Methotrexate (managed by Rheum), zanaflex prn - HYDROcodone-acetaminophen (NORCO) 7.5-325 MG tablet; Take 1 tablet by mouth every 8 (eight) hours as needed for severe pain. Must last 30 days.  Dispense: 90 tablet; Refill: 0 - HYDROcodone-acetaminophen (NORCO) 7.5-325 MG tablet; Take 1 tablet by mouth every 8 (eight) hours as needed for severe pain. Must last 30 days.  Dispense: 90 tablet; Refill: 0 - HYDROcodone-acetaminophen (NORCO) 7.5-325 MG tablet; Take 1 tablet by mouth every 8 (eight) hours as needed for severe pain. Must last 30 days.  Dispense: 90 tablet; Refill: 0  Pharmacotherapy (Medications Ordered): Meds  ordered this encounter  Medications  . HYDROcodone-acetaminophen (NORCO) 7.5-325 MG tablet    Sig: Take 1 tablet by mouth every 8 (eight) hours as needed for severe pain. Must last 30 days.    Dispense:  90 tablet    Refill:  0    Chronic Pain. (STOP Act - Not applicable). Fill one day early if closed on scheduled refill date.  Marland Kitchen HYDROcodone-acetaminophen (NORCO) 7.5-325 MG tablet    Sig: Take 1 tablet by mouth every 8 (eight) hours as needed for severe pain. Must last 30 days.    Dispense:  90 tablet    Refill:  0    Chronic Pain. (STOP Act - Not applicable). Fill one day early if closed on scheduled refill date.  Marland Kitchen HYDROcodone-acetaminophen (NORCO) 7.5-325 MG tablet    Sig: Take 1 tablet by mouth every 8 (eight) hours as needed for severe pain. Must last 30 days.    Dispense:  90 tablet    Refill:  0    Chronic Pain. (STOP Act - Not applicable). Fill one day early if closed on scheduled refill date.   Orders:  Orders Placed This Encounter  Procedures  . C-FCT Blk (Schedule)    Standing Status:  Future    Standing Expiration Date:   03/18/2019    Scheduling Instructions:     Side: LEFT     Level:  C4-5, C5-6, C6-7 Facet joints (C4, C5, C6, & C7 Medial Branch Nerves)     Sedation: With Sedation.     Timeframe: As soon as schedule allows    Order Specific Question:   Where will this procedure be performed?    Answer:   [facility]  . Shoulder injection (Schedule)    Standing Status:   Future    Standing Expiration Date:   03/17/2019    Scheduling Instructions:     Side:LEFT     Sedation: With Sedation.     Timeframe: As soon as schedule allows    Order Specific Question:   Where will this procedure be performed?    Answer:   [facility]    Comments:   Andersen Iorio   Follow-up plan:   Return in about 4 weeks (around 03/15/2019) for Left C4, 5, 6, 7  Fcts + Left shoulder IA , with sedation (block 45 mins).    Recent Visits Date Type Provider Dept  11/18/18 Office  Visit Gillis Santa, MD [facility]  Showing recent visits within past 90 days and meeting all other requirements   Today's Visits Date Type Provider Dept  02/15/19 Office Visit Gillis Santa, MD [facility]  Showing today's visits and meeting all other requirements   Future Appointments No visits were found meeting these conditions.  Showing future appointments within next 90 days and meeting all other requirements   I discussed the assessment and treatment plan with the patient. The patient was provided an opportunity to ask questions and all were answered. The patient agreed with the plan and demonstrated an understanding of the instructions.  Patient advised to call back or seek an in-person evaluation if the symptoms or condition worsens.  Total duration of non-face-to-face encounter:25 minutes.  Note by: Gillis Santa, MD Date: 02/15/2019; Time: 3:49 PM

## 2019-02-21 ENCOUNTER — Ambulatory Visit: Payer: Self-pay | Admitting: Family Medicine

## 2019-02-22 ENCOUNTER — Other Ambulatory Visit: Payer: Self-pay

## 2019-02-22 DIAGNOSIS — E782 Mixed hyperlipidemia: Secondary | ICD-10-CM

## 2019-02-22 MED ORDER — OMEGA-3-ACID ETHYL ESTERS 1 G PO CAPS: 1.0000 g | ORAL_CAPSULE | Freq: Two times a day (BID) | ORAL | 1 refills | Status: DC

## 2019-02-23 ENCOUNTER — Other Ambulatory Visit: Payer: Self-pay | Admitting: Student in an Organized Health Care Education/Training Program

## 2019-02-24 ENCOUNTER — Other Ambulatory Visit: Payer: Self-pay

## 2019-02-24 DIAGNOSIS — I10 Essential (primary) hypertension: Secondary | ICD-10-CM

## 2019-02-24 MED ORDER — TRIAMTERENE-HCTZ 75-50 MG PO TABS: 1.0000 | ORAL_TABLET | Freq: Every day | ORAL | 0 refills | Status: DC

## 2019-02-24 MED ORDER — HYDRALAZINE HCL 50 MG PO TABS: 50.0000 mg | ORAL_TABLET | Freq: Two times a day (BID) | ORAL | 0 refills | Status: DC

## 2019-02-26 ENCOUNTER — Other Ambulatory Visit: Payer: Self-pay | Admitting: Family Medicine

## 2019-02-26 ENCOUNTER — Other Ambulatory Visit: Payer: Self-pay | Admitting: Student in an Organized Health Care Education/Training Program

## 2019-02-28 ENCOUNTER — Other Ambulatory Visit: Payer: Self-pay

## 2019-02-28 ENCOUNTER — Ambulatory Visit: Payer: Medicare Other

## 2019-02-28 ENCOUNTER — Ambulatory Visit
Admission: EM | Admit: 2019-02-28 | Discharge: 2019-02-28 | Disposition: A | Discharge status: Home / Self Care | Payer: Medicare Other | Attending: Internal Medicine | Admitting: Internal Medicine

## 2019-02-28 ENCOUNTER — Telehealth: Payer: Self-pay | Admitting: Student in an Organized Health Care Education/Training Program

## 2019-02-28 DIAGNOSIS — Z7982 Long term (current) use of aspirin: Secondary | ICD-10-CM | POA: Diagnosis not present

## 2019-02-28 DIAGNOSIS — G894 Chronic pain syndrome: Secondary | ICD-10-CM | POA: Insufficient documentation

## 2019-02-28 DIAGNOSIS — J209 Acute bronchitis, unspecified: Secondary | ICD-10-CM

## 2019-02-28 DIAGNOSIS — Z87891 Personal history of nicotine dependence: Secondary | ICD-10-CM | POA: Insufficient documentation

## 2019-02-28 DIAGNOSIS — K219 Gastro-esophageal reflux disease without esophagitis: Secondary | ICD-10-CM | POA: Insufficient documentation

## 2019-02-28 DIAGNOSIS — Z6835 Body mass index (BMI) 35.0-35.9, adult: Secondary | ICD-10-CM | POA: Insufficient documentation

## 2019-02-28 DIAGNOSIS — E782 Mixed hyperlipidemia: Secondary | ICD-10-CM | POA: Diagnosis not present

## 2019-02-28 DIAGNOSIS — M48062 Spinal stenosis, lumbar region with neurogenic claudication: Secondary | ICD-10-CM | POA: Insufficient documentation

## 2019-02-28 DIAGNOSIS — J45909 Unspecified asthma, uncomplicated: Secondary | ICD-10-CM | POA: Diagnosis not present

## 2019-02-28 DIAGNOSIS — I700 Atherosclerosis of aorta: Secondary | ICD-10-CM | POA: Diagnosis not present

## 2019-02-28 DIAGNOSIS — Z20822 Contact with and (suspected) exposure to covid-19: Secondary | ICD-10-CM | POA: Diagnosis not present

## 2019-02-28 DIAGNOSIS — Z79899 Other long term (current) drug therapy: Secondary | ICD-10-CM | POA: Diagnosis not present

## 2019-02-28 DIAGNOSIS — M5116 Intervertebral disc disorders with radiculopathy, lumbar region: Secondary | ICD-10-CM | POA: Insufficient documentation

## 2019-02-28 DIAGNOSIS — R059 Cough, unspecified: Secondary | ICD-10-CM

## 2019-02-28 DIAGNOSIS — D509 Iron deficiency anemia, unspecified: Secondary | ICD-10-CM | POA: Diagnosis not present

## 2019-02-28 DIAGNOSIS — I129 Hypertensive chronic kidney disease with stage 1 through stage 4 chronic kidney disease, or unspecified chronic kidney disease: Secondary | ICD-10-CM | POA: Insufficient documentation

## 2019-02-28 DIAGNOSIS — R05 Cough: Secondary | ICD-10-CM

## 2019-02-28 DIAGNOSIS — N183 Chronic kidney disease, stage 3 unspecified: Secondary | ICD-10-CM | POA: Diagnosis not present

## 2019-02-28 DIAGNOSIS — E876 Hypokalemia: Secondary | ICD-10-CM | POA: Insufficient documentation

## 2019-02-28 DIAGNOSIS — E039 Hypothyroidism, unspecified: Secondary | ICD-10-CM | POA: Insufficient documentation

## 2019-02-28 DIAGNOSIS — R079 Chest pain, unspecified: Secondary | ICD-10-CM | POA: Diagnosis not present

## 2019-02-28 DIAGNOSIS — R0602 Shortness of breath: Secondary | ICD-10-CM | POA: Diagnosis not present

## 2019-02-28 MED ORDER — BENZONATATE 100 MG PO CAPS: 100.0000 mg | ORAL_CAPSULE | Freq: Two times a day (BID) | ORAL | 0 refills | Status: DC | PRN

## 2019-02-28 MED ORDER — GABAPENTIN 600 MG PO TABS: 600.0000 mg | ORAL_TABLET | Freq: Every day | ORAL | 5 refills | Status: DC

## 2019-02-28 NOTE — ED Provider Notes (Signed)
[facility]    CSN: [id number] Arrival date & time: 02/28/19  1925      History   Chief Complaint Chief Complaint  Patient presents with  . Cough    HPI DEDEE Moore is a 69 y.o. female comes to urgent care with a 8 to 10-week history of nonproductive cough.  Patient says that she has associated bilateral chest wall pain associated with cough.  She denies any cough or sputum production.  No fever or chills.  Patient has nasal congestion.  No sore throat. HPI  Past Medical History:  Diagnosis Date  . Allergy   . Anemia   . Arthritis   . Asthma   . Chronic kidney disease    STAGE 3 PER DR EASON 08/02/15  . Collagen vascular disease ([place])   . Family history of adverse reaction to anesthesia    sister difficult to put to sleep  . GERD (gastroesophageal reflux disease)   . H/O tooth extraction    all lower teeth 1/19  . Hemorrhoid   . History of hiatal hernia   . Hypertension   . Hypothyroidism   . Migraines   . Mixed hyperlipidemia   . Multiple gastric ulcers   . Thyroid disease   . Wears dentures    partial upper and lower    Patient Active Problem List   Diagnosis Date Noted  . Trochanteric bursitis of left hip 10/04/2018  . SI joint arthritis (left) 10/04/2018  . Exertional chest pain 09/30/2018  . CKD (chronic kidney disease), stage III 09/30/2018  . Female pelvic pain 01/20/2018  . History of revision of total knee arthroplasty 01/20/2018  . Iron deficiency anemia   . Heme + stool   . Acute gastritis without hemorrhage   . Gastric polyps   . Benign neoplasm of ascending colon   . Benign neoplasm of transverse colon   . Polyp of sigmoid colon   . Hypertension 10/01/2017  . Hypothyroidism 10/01/2017  . Gastroesophageal reflux disease 10/01/2017  . Hypokalemia 10/01/2017  . Mixed hyperlipidemia 10/01/2017  . Reactive airway disease 10/01/2017  . Bradycardia 08/24/2017  . Severe obesity (BMI 35.0-35.9 with comorbidity) ([place]) 08/18/2017  .  Chronic gouty arthropathy without tophi 06/25/2017  . Encounter for long-term (current) use of high-risk medication 06/25/2017  . Positive ANA (antinuclear antibody) 06/17/2017  . Cervical spondylosis without myelopathy 06/16/2017  . Osteoarthritis of left shoulder due to rotator cuff injury 06/16/2017  . Sprain of anterior talofibular ligament of right ankle 06/16/2017  . Lumbar radiculopathy 04/21/2017  . Lumbar degenerative disc disease 04/21/2017  . Chronic pain syndrome 04/21/2017  . Spinal stenosis, lumbar region, with neurogenic claudication 04/21/2017  . SS-A antibody positive 03/05/2017  . Pain, lower extremity 08/03/2015  . DOE (dyspnea on exertion) 12/28/2013    Past Surgical History:  Procedure Laterality Date  . ANKLE SURGERY    . CARPAL TUNNEL RELEASE     x3  . COLONOSCOPY  2000?  . COLONOSCOPY WITH PROPOFOL N/A 10/22/2017   Procedure: COLONOSCOPY WITH PROPOFOL;  Surgeon: Lucilla Lame, MD;  Location: [place];  Service: Endoscopy;  Laterality: N/A;  . ESOPHAGOGASTRODUODENOSCOPY (EGD) WITH PROPOFOL N/A 10/22/2017   Procedure: ESOPHAGOGASTRODUODENOSCOPY (EGD) WITH PROPOFOL;  Surgeon: Lucilla Lame, MD;  Location: [place];  Service: Endoscopy;  Laterality: N/A;  . FUSION OF TALONAVICULAR JOINT Right 08/03/2015   Procedure: TAILOR NAVICULAR JOINT FUSION - RIGHT ;  Surgeon: [name], DPM;  Location: [facility];  Service: Podiatry;  Laterality: Right;  . JOINT REPLACEMENT     knee x 3 ,right x1 and left x2  . POLYPECTOMY N/A 10/22/2017   Procedure: POLYPECTOMY;  Surgeon: Lucilla Lame, MD;  Location: [place];  Service: Endoscopy;  Laterality: N/A;  . ROTATOR CUFF REPAIR    . TONSILLECTOMY    . TOTAL KNEE REVISION Right 01/20/2018   Procedure: POLYETHYLENE EXCHANGE;  Surgeon: Dereck Leep, MD;  Location: [facility];  Service: Orthopedics;  Laterality: Right;  . TUBAL LIGATION    . UPPER GI ENDOSCOPY  2000?    OB History    Gravida  4    Para      Term      Preterm      AB      Living        SAB      TAB      Ectopic      Multiple      Live Births           Obstetric Comments  1st Menstrual Cycle:  16 1st Pregnancy:  19         Home Medications    Prior to Admission medications   Medication Sig Start Date End Date Taking? Authorizing Provider  albuterol (PROVENTIL HFA;VENTOLIN HFA) 108 (90 Base) MCG/ACT inhaler Inhale 2 puffs into the lungs every 4 (four) hours as needed for wheezing. 10/01/17   Juline Patch, MD  allopurinol (ZYLOPRIM) 100 MG tablet Take 200 mg by mouth daily.  08/06/17   [provider]  ASPIRIN 81 PO Take 81 mg by mouth.    [provider]  benzonatate (TESSALON) 100 MG capsule Take 1 capsule (100 mg total) by mouth 2 (two) times daily as needed for cough. 02/28/19   Chase Picket, MD  Cholecalciferol (VITAMIN D3) 2000 units TABS Take 2,000 Units by mouth daily.    [provider]  diclofenac sodium (VOLTAREN) 1 % GEL Apply 2 g topically 3 (three) times daily.  08/11/17   [provider]  ferrous sulfate 325 (65 FE) MG tablet Take 325 mg by mouth daily with breakfast.     [provider]  fluticasone (FLONASE) 50 MCG/ACT nasal spray SHAKE LIQUID AND USE 1 SPRAY IN EACH NOSTRIL DAILY 02/28/19   Juline Patch, MD  gabapentin (NEURONTIN) 600 MG tablet Take 1 tablet (600 mg total) by mouth at bedtime. 02/28/19 08/27/19  Gillis Santa, MD  hydrALAZINE (APRESOLINE) 50 MG tablet Take 1 tablet (50 mg total) by mouth 2 (two) times daily. 02/24/19   Juline Patch, MD  HYDROcodone-acetaminophen (NORCO) 7.5-325 MG tablet Take 1 tablet by mouth every 8 (eight) hours as needed for severe pain. Must last 30 days. 03/02/19 04/01/19  Gillis Santa, MD  HYDROcodone-acetaminophen (NORCO) 7.5-325 MG tablet Take 1 tablet by mouth every 8 (eight) hours as needed for severe pain. Must last 30 days. 04/01/19 05/01/19  Gillis Santa, MD  HYDROcodone-acetaminophen (NORCO)  7.5-325 MG tablet Take 1 tablet by mouth every 8 (eight) hours as needed for severe pain. Must last 30 days. 05/01/19 05/31/19  Gillis Santa, MD  ketoconazole (NIZORAL) 2 % cream APP AA ON THE FEET HS FOR FUNGAL INFECTION. 11/26/18   [provider]  levothyroxine (SYNTHROID) 200 MCG tablet TAKE 1 TABLET(200 MCG) BY MOUTH DAILY BEFORE BREAKFAST 01/03/19   Juline Patch, MD  losartan (COZAAR) 25 MG tablet Take 25 mg by mouth daily.    [provider]  meclizine (ANTIVERT) 12.5 MG tablet Take 1 tablet (12.5 mg total) by mouth 2 (two) times daily as needed for dizziness. 09/14/17   Juline Patch, MD  methotrexate (RHEUMATREX) 2.5 MG tablet Take 20 mg by mouth every Friday. 09/29/18   [provider]  Multiple Vitamins-Calcium (ONE-A-DAY WOMENS FORMULA PO) Take 1 tablet by mouth daily.    [provider]  omega-3 acid ethyl esters (LOVAZA) 1 g capsule Take 1 capsule (1 g total) by mouth 2 (two) times daily. 02/22/19   Juline Patch, MD  Omega-3 Fatty Acids (FISH OIL) 1000 MG CAPS 1 g nightly 08/01/13   [provider]  omeprazole (PRILOSEC) 40 MG capsule TAKE 1 CAPSULE(40 MG) BY MOUTH DAILY Patient taking differently: Take 40 mg by mouth daily.  07/14/18   Juline Patch, MD  Polyethyl Glycol-Propyl Glycol (SYSTANE OP) Place 1 drop into both eyes daily as needed (dry eyes).     [provider]  potassium chloride SA (K-DUR) 20 MEQ tablet Take 0.5 tablets (10 mEq total) by mouth daily. 07/14/18   Juline Patch, MD  Propylene Glycol (SYSTANE BALANCE OP) Apply 1 drop to eye 2 (two) times daily.    [provider]  tiZANidine (ZANAFLEX) 4 MG tablet Take 1 tablet (4 mg total) by mouth 2 (two) times daily as needed for muscle spasms. 11/18/18   Gillis Santa, MD  triamcinolone cream (KENALOG) 0.1 % Apply 1 application topically every morning.    [provider]  triamterene-hydrochlorothiazide (MAXZIDE) 75-50 MG tablet Take 1 tablet by mouth  daily. 02/24/19   Juline Patch, MD    Family History Family History  Problem Relation Age of Onset  . Heart failure Mother   . Gout Mother   . Arthritis Mother   . Hypertension Mother   . Stroke Mother   . Heart failure Father   . Diabetes Father   . Hyperlipidemia Father   . COPD Sister   . Cancer Maternal Aunt        breast  . Cancer Maternal Uncle        kidney    Social History Social History   Tobacco Use  . Smoking status: Former Smoker    Quit date: 02/24/1993    Years since quitting: 26.0  . Smokeless tobacco: Never Used  Substance Use Topics  . Alcohol use: No    Alcohol/week: 0.0 standard drinks  . Drug use: No     Allergies   Ampicillin, Penicillins, and Vibramycin [doxycycline calcium]   Review of Systems Review of Systems  Constitutional: Positive for activity change. Negative for chills, fatigue and fever.  HENT: Negative.   Respiratory: Positive for cough. Negative for shortness of breath and wheezing.   Gastrointestinal: Positive for abdominal pain. Negative for diarrhea, nausea and vomiting.  Genitourinary: Negative.   Musculoskeletal: Negative.      Physical Exam Triage Vital Signs ED Triage Vitals  Enc Vitals Group     BP 02/28/19 1937 (!) 154/76     Pulse Rate 02/28/19 1937 79     Resp 02/28/19 1937 19     Temp 02/28/19 1937 98.4 F (36.9 C)     Temp Source 02/28/19 1937 Oral     SpO2 02/28/19 1937 100 %     Weight 02/28/19 1931 260 lb (117.9 kg)     Height --      Head Circumference --      Peak Flow --  Pain Score 02/28/19 1931 8     Pain Loc --      Pain Edu? --      Excl. in [place]? --    No data found.  Updated Vital Signs BP (!) 154/76 (BP Location: Left Arm)   Pulse 79   Temp 98.4 F (36.9 C) (Oral)   Resp 19   Wt 117.9 kg   SpO2 100%   BMI 36.26 kg/m   Visual Acuity Right Eye Distance:   Left Eye Distance:   Bilateral Distance:    Right Eye Near:   Left Eye Near:    Bilateral Near:     Physical  Exam   UC Treatments / Results  Labs (all labs ordered are listed, but only abnormal results are displayed) Labs Reviewed  NOVEL CORONAVIRUS, NAA (HOSP ORDER, SEND-OUT TO REF LAB; TAT 18-24 HRS)    EKG   Radiology DG Chest 2 View  Result Date: 02/28/2019 CLINICAL DATA:  Cough. Shortness of breath. Musculoskeletal chest pain. EXAM: CHEST - 2 VIEW COMPARISON:  09/30/2018 FINDINGS: The heart size and pulmonary vascularity are normal and the lungs are clear. No effusions. No significant bone abnormality. Aortic atherosclerosis. IMPRESSION: 1. No active cardiopulmonary disease. 2.  Aortic Atherosclerosis (ICD10-I70.0). Electronically Signed   By: Lorriane Shire M.D.   On: 02/28/2019 20:21    Procedures Procedures (including critical care time)  Medications Ordered in UC Medications - No data to display  Initial Impression / Assessment and Plan / UC Course  I have reviewed the triage vital signs and the nursing notes.  Pertinent labs & imaging results that were available during my care of the patient were reviewed by me and considered in my medical decision making (see chart for details).     1.  Acute bronchitis: Tessalon Perles 100 mg twice daily as needed for cough No indication for antibiotics Chest x-ray is negative for acute lung infiltrate. If patient symptoms worsen he is advised to go to the emergency department to be reevaluated. Final Clinical Impressions(s) / UC Diagnoses   Final diagnoses:  Acute bronchitis, unspecified organism   Discharge Instructions   None    ED Prescriptions    Medication Sig Dispense Auth. Provider   benzonatate (TESSALON) 100 MG capsule Take 1 capsule (100 mg total) by mouth 2 (two) times daily as needed for cough. 30 capsule Tymar Polyak, Myrene Galas, MD     PDMP not reviewed this encounter.   Chase Picket, MD 03/01/19 1620

## 2019-02-28 NOTE — ED Triage Notes (Addendum)
Pt. States she has been having nosebleeds, cough, congestion since 01/20/2019. See has seen her pcp since for this encounter as well.

## 2019-02-28 NOTE — Telephone Encounter (Signed)
Patient lvmail states she needs refill on gabapentin. Her next mm appt is April. Please verify if patient has refills at pharmacy and let patient know.

## 2019-03-02 LAB — NOVEL CORONAVIRUS, NAA (HOSP ORDER, SEND-OUT TO REF LAB; TAT 18-24 HRS): SARS-CoV-2, NAA: NOT DETECTED

## 2019-03-06 DIAGNOSIS — R809 Proteinuria, unspecified: Secondary | ICD-10-CM | POA: Insufficient documentation

## 2019-03-06 DIAGNOSIS — I10 Essential (primary) hypertension: Secondary | ICD-10-CM | POA: Insufficient documentation

## 2019-03-07 DIAGNOSIS — N1832 Chronic kidney disease, stage 3b: Secondary | ICD-10-CM | POA: Diagnosis not present

## 2019-03-07 DIAGNOSIS — R809 Proteinuria, unspecified: Secondary | ICD-10-CM | POA: Diagnosis not present

## 2019-03-08 DIAGNOSIS — R7989 Other specified abnormal findings of blood chemistry: Secondary | ICD-10-CM | POA: Diagnosis not present

## 2019-03-09 ENCOUNTER — Ambulatory Visit (INDEPENDENT_AMBULATORY_CARE_PROVIDER_SITE_OTHER): Payer: Medicare Other

## 2019-03-09 DIAGNOSIS — Z0000 Encounter for general adult medical examination without abnormal findings: Secondary | ICD-10-CM

## 2019-03-09 DIAGNOSIS — Z780 Asymptomatic menopausal state: Secondary | ICD-10-CM

## 2019-03-09 DIAGNOSIS — Z1231 Encounter for screening mammogram for malignant neoplasm of breast: Secondary | ICD-10-CM

## 2019-03-09 NOTE — Patient Instructions (Signed)
[name] , Thank you for taking time to come for your Medicare Wellness Visit. I appreciate your ongoing commitment to your health goals. Please review the following plan we discussed and let me know if I can assist you in the future.   Screening recommendations/referrals: Colonoscopy: done 10/22/17. Repeat in 2024 Mammogram: done 08/23/12. Please call [phone] to schedule your mammogram and bone density screening.  Recommended yearly ophthalmology/optometry visit for glaucoma screening and checkup Recommended yearly dental visit for hygiene and checkup  Vaccinations: Influenza vaccine: done 10/19/18 Pneumococcal vaccine: done 12/23/16 Tdap vaccine: due Shingles vaccine: Shingrix discussed. Please contact your pharmacy for coverage information.   Advanced directives: Advance directive discussed with you today. Even though you declined this today please call our office should you change your mind and we can give you the proper paperwork for you to fill out.  Conditions/risks identified: Recommend continuing to prevent falls   Next appointment: Please follow up in one year for your Medicare Annual Wellness visit.     Preventive Care 73 Years and Older, Female Preventive care refers to lifestyle choices and visits with your health care provider that can promote health and wellness. What does preventive care include?  A yearly physical exam. This is also called an annual well check.  Dental exams once or twice a year.  Routine eye exams. Ask your health care provider how often you should have your eyes checked.  Personal lifestyle choices, including:  Daily care of your teeth and gums.  Regular physical activity.  Eating a healthy diet.  Avoiding tobacco and drug use.  Limiting alcohol use.  Practicing safe sex.  Taking low-dose aspirin every day.  Taking vitamin and mineral supplements as recommended by your health care provider. What happens during an annual well  check? The services and screenings done by your health care provider during your annual well check will depend on your age, overall health, lifestyle risk factors, and family history of disease. Counseling  Your health care provider may ask you questions about your:  Alcohol use.  Tobacco use.  Drug use.  Emotional well-being.  Home and relationship well-being.  Sexual activity.  Eating habits.  History of falls.  Memory and ability to understand (cognition).  Work and work Statistician.  Reproductive health. Screening  You may have the following tests or measurements:  Height, weight, and BMI.  Blood pressure.  Lipid and cholesterol levels. These may be checked every 5 years, or more frequently if you are over 70 years old.  Skin check.  Lung cancer screening. You may have this screening every year starting at age 37 if you have a 30-pack-year history of smoking and currently smoke or have quit within the past 15 years.  Fecal occult blood test (FOBT) of the stool. You may have this test every year starting at age 71.  Flexible sigmoidoscopy or colonoscopy. You may have a sigmoidoscopy every 5 years or a colonoscopy every 10 years starting at age 18.  Hepatitis C blood test.  Hepatitis B blood test.  Sexually transmitted disease (STD) testing.  Diabetes screening. This is done by checking your blood sugar (glucose) after you have not eaten for a while (fasting). You may have this done every 1-3 years.  Bone density scan. This is done to screen for osteoporosis. You may have this done starting at age 56.  Mammogram. This may be done every 1-2 years. Talk to your health care provider about how often you should have regular mammograms. Talk  with your health care provider about your test results, treatment options, and if necessary, the need for more tests. Vaccines  Your health care provider may recommend certain vaccines, such as:  Influenza vaccine. This is  recommended every year.  Tetanus, diphtheria, and acellular pertussis (Tdap, Td) vaccine. You may need a Td booster every 10 years.  Zoster vaccine. You may need this after age 75.  Pneumococcal 13-valent conjugate (PCV13) vaccine. One dose is recommended after age 15.  Pneumococcal polysaccharide (PPSV23) vaccine. One dose is recommended after age 57. Talk to your health care provider about which screenings and vaccines you need and how often you need them. This information is not intended to replace advice given to you by your health care provider. Make sure you discuss any questions you have with your health care provider. Document Released: 03/09/2015 Document Revised: 10/31/2015 Document Reviewed: 12/12/2014 Elsevier Interactive Patient Education  2017 [place] Prevention in the Home Falls can cause injuries. They can happen to people of all ages. There are many things you can do to make your home safe and to help prevent falls. What can I do on the outside of my home?  Regularly fix the edges of walkways and driveways and fix any cracks.  Remove anything that might make you trip as you walk through a door, such as a raised step or threshold.  Trim any bushes or trees on the path to your home.  Use bright outdoor lighting.  Clear any walking paths of anything that might make someone trip, such as rocks or tools.  Regularly check to see if handrails are loose or broken. Make sure that both sides of any steps have handrails.  Any raised decks and porches should have guardrails on the edges.  Have any leaves, snow, or ice cleared regularly.  Use sand or salt on walking paths during winter.  Clean up any spills in your garage right away. This includes oil or grease spills. What can I do in the bathroom?  Use night lights.  Install grab bars by the toilet and in the tub and shower. Do not use towel bars as grab bars.  Use non-skid mats or decals in the tub or  shower.  If you need to sit down in the shower, use a plastic, non-slip stool.  Keep the floor dry. Clean up any water that spills on the floor as soon as it happens.  Remove soap buildup in the tub or shower regularly.  Attach bath mats securely with double-sided non-slip rug tape.  Do not have throw rugs and other things on the floor that can make you trip. What can I do in the bedroom?  Use night lights.  Make sure that you have a light by your bed that is easy to reach.  Do not use any sheets or blankets that are too big for your bed. They should not hang down onto the floor.  Have a firm chair that has side arms. You can use this for support while you get dressed.  Do not have throw rugs and other things on the floor that can make you trip. What can I do in the kitchen?  Clean up any spills right away.  Avoid walking on wet floors.  Keep items that you use a lot in easy-to-reach places.  If you need to reach something above you, use a strong step stool that has a grab bar.  Keep electrical cords out of the way.  Do  not use floor polish or wax that makes floors slippery. If you must use wax, use non-skid floor wax.  Do not have throw rugs and other things on the floor that can make you trip. What can I do with my stairs?  Do not leave any items on the stairs.  Make sure that there are handrails on both sides of the stairs and use them. Fix handrails that are broken or loose. Make sure that handrails are as long as the stairways.  Check any carpeting to make sure that it is firmly attached to the stairs. Fix any carpet that is loose or worn.  Avoid having throw rugs at the top or bottom of the stairs. If you do have throw rugs, attach them to the floor with carpet tape.  Make sure that you have a light switch at the top of the stairs and the bottom of the stairs. If you do not have them, ask someone to add them for you. What else can I do to help prevent  falls?  Wear shoes that:  Do not have high heels.  Have rubber bottoms.  Are comfortable and fit you well.  Are closed at the toe. Do not wear sandals.  If you use a stepladder:  Make sure that it is fully opened. Do not climb a closed stepladder.  Make sure that both sides of the stepladder are locked into place.  Ask someone to hold it for you, if possible.  Clearly mark and make sure that you can see:  Any grab bars or handrails.  First and last steps.  Where the edge of each step is.  Use tools that help you move around (mobility aids) if they are needed. These include:  Canes.  Walkers.  Scooters.  Crutches.  Turn on the lights when you go into a dark area. Replace any light bulbs as soon as they burn out.  Set up your furniture so you have a clear path. Avoid moving your furniture around.  If any of your floors are uneven, fix them.  If there are any pets around you, be aware of where they are.  Review your medicines with your doctor. Some medicines can make you feel dizzy. This can increase your chance of falling. Ask your doctor what other things that you can do to help prevent falls. This information is not intended to replace advice given to you by your health care provider. Make sure you discuss any questions you have with your health care provider. Document Released: 12/07/2008 Document Revised: 07/19/2015 Document Reviewed: 03/17/2014 Elsevier Interactive Patient Education  2017 [employer].

## 2019-03-09 NOTE — Progress Notes (Signed)
Subjective:   Michele Moore is a 69 y.o. female who presents for Medicare Annual (Subsequent) preventive examination.  Virtual Visit via Telephone Note  I connected with [name] on 03/09/19 at  1:20 PM EST by telephone and verified that I am speaking with the correct person using two identifiers.  Medicare Annual Wellness visit completed telephonically due to Covid-19 pandemic.   Location: Patient: home Provider: office   I discussed the limitations, risks, security and privacy concerns of performing an evaluation and management service by telephone and the availability of in person appointments. The patient expressed understanding and agreed to proceed.  Some vital signs may be absent or patient reported.   [name], LPN     Review of Systems:   Cardiac Risk Factors include: advanced age (>25men, >88 women);obesity (BMI >30kg/m2);hypertension;dyslipidemia     Objective:     Vitals: There were no vitals taken for this visit.  There is no height or weight on file to calculate BMI.  Advanced Directives 03/09/2019 09/30/2018 09/30/2018 08/23/2018 08/09/2018 05/21/2018 05/13/2018  Does Patient Have a Medical Advance Directive? No No No No No No No  Would patient like information on creating a medical advance directive? No - Patient declined No - Patient declined - No - Patient declined No - Patient declined - -    Tobacco Social History   Tobacco Use  Smoking Status Former Smoker  . Quit date: 02/24/1993  . Years since quitting: 26.0  Smokeless Tobacco Never Used     Counseling given: Not Answered   Clinical Intake:  Pre-visit preparation completed: Yes  Pain : 0-10 Pain Score: 8  Pain Type: Chronic pain Pain Location: (all over) Pain Onset: More than a month ago Pain Frequency: Constant     Nutritional Risks: None Diabetes: No  How often do you need to have someone help you when you read instructions, pamphlets, or other written materials from your doctor  or pharmacy?: 1 - Never  Interpreter Needed?: No  Information entered by :: [name] LPN  Past Medical History:  Diagnosis Date  . Allergy   . Anemia   . Arthritis   . Asthma   . Cataract   . Chronic kidney disease    STAGE 3 PER DR EASON 08/02/15  . Collagen vascular disease ([place])   . Family history of adverse reaction to anesthesia    sister difficult to put to sleep  . GERD (gastroesophageal reflux disease)   . Glaucoma   . H/O tooth extraction    all lower teeth 1/19  . Hemorrhoid   . History of hiatal hernia   . Hypertension   . Hypothyroidism   . Migraines   . Mixed hyperlipidemia   . Multiple gastric ulcers   . Thyroid disease   . Wears dentures    partial upper and lower   Past Surgical History:  Procedure Laterality Date  . ANKLE SURGERY    . CARPAL TUNNEL RELEASE     x3  . COLONOSCOPY  2000?  . COLONOSCOPY WITH PROPOFOL N/A 10/22/2017   Procedure: COLONOSCOPY WITH PROPOFOL;  Surgeon: Lucilla Lame, MD;  Location: [facility];  Service: Endoscopy;  Laterality: N/A;  . ESOPHAGOGASTRODUODENOSCOPY (EGD) WITH PROPOFOL N/A 10/22/2017   Procedure: ESOPHAGOGASTRODUODENOSCOPY (EGD) WITH PROPOFOL;  Surgeon: Lucilla Lame, MD;  Location: [facility];  Service: Endoscopy;  Laterality: N/A;  . FUSION OF TALONAVICULAR JOINT Right 08/03/2015   Procedure: TAILOR NAVICULAR JOINT FUSION - RIGHT ;  Surgeon: [name], DPM;  Location: [facility];  Service: Podiatry;  Laterality: Right;  . JOINT REPLACEMENT     knee x 3 ,right x1 and left x2  . POLYPECTOMY N/A 10/22/2017   Procedure: POLYPECTOMY;  Surgeon: Lucilla Lame, MD;  Location: [facility];  Service: Endoscopy;  Laterality: N/A;  . ROTATOR CUFF REPAIR    . TONSILLECTOMY    . TOTAL KNEE REVISION Right 01/20/2018   Procedure: POLYETHYLENE EXCHANGE;  Surgeon: Dereck Leep, MD;  Location: [facility];  Service: Orthopedics;  Laterality: Right;  . TUBAL LIGATION    . UPPER GI ENDOSCOPY  2000?    Family History  Problem Relation Age of Onset  . Heart failure Mother   . Gout Mother   . Arthritis Mother   . Hypertension Mother   . Stroke Mother   . Heart failure Father   . Diabetes Father   . Hyperlipidemia Father   . COPD Sister   . Cancer Maternal Aunt        breast  . Cancer Maternal Uncle        kidney   Social History   Socioeconomic History  . Marital status: Divorced    Spouse name: Not on file  . Number of children: 4  . Years of education: Not on file  . Highest education level: 9th grade  Occupational History  . Occupation: retired  Tobacco Use  . Smoking status: Former Smoker    Quit date: 02/24/1993    Years since quitting: 26.0  . Smokeless tobacco: Never Used  Substance and Sexual Activity  . Alcohol use: No    Alcohol/week: 0.0 standard drinks  . Drug use: No  . Sexual activity: Not Currently  Other Topics Concern  . Not on file  Social History Narrative  . Not on file   Social Determinants of Health   Financial Resource Strain: Medium Risk  . Difficulty of Paying Living Expenses: Somewhat hard  Food Insecurity: No Food Insecurity  . Worried About Charity fundraiser in the Last Year: Never true  . Ran Out of Food in the Last Year: Never true  Transportation Needs: No Transportation Needs  . Lack of Transportation (Medical): No  . Lack of Transportation (Non-Medical): No  Physical Activity: Inactive  . Days of Exercise per Week: 0 days  . Minutes of Exercise per Session: 0 min  Stress: Stress Concern Present  . Feeling of Stress : To some extent  Social Connections: Somewhat Isolated  . Frequency of Communication with Friends and Family: More than three times a week  . Frequency of Social Gatherings with Friends and Family: More than three times a week  . Attends Religious Services: More than 4 times per year  . Active Member of Clubs or Organizations: No  . Attends Archivist Meetings: Never  . Marital Status: Divorced     Outpatient Encounter Medications as of 03/09/2019  Medication Sig  . albuterol (PROVENTIL HFA;VENTOLIN HFA) 108 (90 Base) MCG/ACT inhaler Inhale 2 puffs into the lungs every 4 (four) hours as needed for wheezing.  Marland Kitchen allopurinol (ZYLOPRIM) 100 MG tablet Take 200 mg by mouth daily.   . ASPIRIN 81 PO Take 81 mg by mouth.  . benzonatate (TESSALON) 100 MG capsule Take 1 capsule (100 mg total) by mouth 2 (two) times daily as needed for cough.  . Cholecalciferol (VITAMIN D3) 2000 units TABS Take 2,000 Units by mouth daily.  . diclofenac sodium (VOLTAREN) 1 %  GEL Apply 2 g topically 3 (three) times daily.   . ferrous sulfate 325 (65 FE) MG tablet Take 325 mg by mouth daily with breakfast.   . fluticasone (FLONASE) 50 MCG/ACT nasal spray SHAKE LIQUID AND USE 1 SPRAY IN EACH NOSTRIL DAILY  . gabapentin (NEURONTIN) 600 MG tablet Take 1 tablet (600 mg total) by mouth at bedtime.  . hydrALAZINE (APRESOLINE) 50 MG tablet Take 1 tablet (50 mg total) by mouth 2 (two) times daily.  Marland Kitchen HYDROcodone-acetaminophen (NORCO) 7.5-325 MG tablet Take 1 tablet by mouth every 8 (eight) hours as needed for severe pain. Must last 30 days.  Marland Kitchen ketoconazole (NIZORAL) 2 % cream APP AA ON THE FEET HS FOR FUNGAL INFECTION.  Marland Kitchen levothyroxine (SYNTHROID) 200 MCG tablet TAKE 1 TABLET(200 MCG) BY MOUTH DAILY BEFORE BREAKFAST  . losartan (COZAAR) 25 MG tablet Take 25 mg by mouth daily.  . meclizine (ANTIVERT) 12.5 MG tablet Take 1 tablet (12.5 mg total) by mouth 2 (two) times daily as needed for dizziness.  . methotrexate (RHEUMATREX) 2.5 MG tablet Take 20 mg by mouth every Friday. Take 5 tablets by mouth once weekly  . Multiple Vitamins-Calcium (ONE-A-DAY WOMENS FORMULA PO) Take 1 tablet by mouth daily.  Marland Kitchen omega-3 acid ethyl esters (LOVAZA) 1 g capsule Take 1 capsule (1 g total) by mouth 2 (two) times daily.  Marland Kitchen omeprazole (PRILOSEC) 40 MG capsule TAKE 1 CAPSULE(40 MG) BY MOUTH DAILY (Patient taking differently: Take 40 mg by mouth  daily. )  . Polyethyl Glycol-Propyl Glycol (SYSTANE OP) Place 1 drop into both eyes daily as needed (dry eyes).   . potassium chloride SA (K-DUR) 20 MEQ tablet Take 0.5 tablets (10 mEq total) by mouth daily.  Marland Kitchen tiZANidine (ZANAFLEX) 4 MG tablet Take 1 tablet (4 mg total) by mouth 2 (two) times daily as needed for muscle spasms.  Marland Kitchen triamcinolone cream (KENALOG) 0.1 % Apply 1 application topically every morning.  . triamterene-hydrochlorothiazide (MAXZIDE) 75-50 MG tablet Take 1 tablet by mouth daily.  Derrill Memo ON 04/01/2019] HYDROcodone-acetaminophen (NORCO) 7.5-325 MG tablet Take 1 tablet by mouth every 8 (eight) hours as needed for severe pain. Must last 30 days.  Derrill Memo ON 05/01/2019] HYDROcodone-acetaminophen (NORCO) 7.5-325 MG tablet Take 1 tablet by mouth every 8 (eight) hours as needed for severe pain. Must last 30 days.  . [DISCONTINUED] Omega-3 Fatty Acids (FISH OIL) 1000 MG CAPS 1 g nightly  . [DISCONTINUED] Propylene Glycol (SYSTANE BALANCE OP) Apply 1 drop to eye 2 (two) times daily.   No facility-administered encounter medications on file as of 03/09/2019.    Activities of Daily Living In your present state of health, do you have any difficulty performing the following activities: 03/09/2019 09/30/2018  Hearing? Y N  Comment declines hearing aids -  Vision? Y N  Difficulty concentrating or making decisions? N N  Walking or climbing stairs? Y N  Dressing or bathing? N N  Doing errands, shopping? N N  Preparing Food and eating ? N -  Using the Toilet? N -  In the past six months, have you accidently leaked urine? N -  Do you have problems with loss of bowel control? N -  Managing your Medications? N -  Managing your Finances? N -  Housekeeping or managing your Housekeeping? N -  Some recent data might be hidden    Patient Care Team: Juline Patch, MD as PCP - General (Family Medicine) Christene Lye, MD (General Surgery) Marden Noble, MD (Internal  Medicine)     Assessment:   This is a routine wellness examination for Charlei.  Exercise Activities and Dietary recommendations Current Exercise Habits: The patient does not participate in regular exercise at present, Exercise limited by: orthopedic condition(s);neurologic condition(s)  Goals    . Increase physical activity     Increase physical activity as tolerated.       Fall Risk Fall Risk  03/09/2019 10/18/2018 03/30/2018 03/01/2018 12/29/2017  Falls in the past year? 0 0 0 1 0  Number falls in past yr: 0 - - 1 -  Injury with Fall? 0 - - 0 -  Comment - - - - -  Risk Factor Category  - - - - -  Risk for fall due to : Impaired balance/gait;Impaired vision - - History of fall(s);Impaired mobility -  Risk for fall due to: Comment - - - - -  Follow up Falls prevention discussed - - Falls evaluation completed;Falls prevention discussed -   FALL RISK PREVENTION PERTAINING TO THE HOME:  Any stairs in or around the home? No  If so, do they handrails? No  - ramp outside  Home free of loose throw rugs in walkways, pet beds, electrical cords, etc? Yes  Adequate lighting in your home to reduce risk of falls? Yes   ASSISTIVE DEVICES UTILIZED TO PREVENT FALLS:  Life alert? No  Use of a cane, walker or w/c? Yes  Grab bars in the bathroom? No  Shower chair or bench in shower? Yes  Elevated toilet seat or a handicapped toilet? Yes   DME ORDERS:  DME order needed?  No   TIMED UP AND GO:  Was the test performed? No . Telephonic visit.   Education: Fall risk prevention has been discussed.  Intervention(s) required? No   Depression Screen PHQ 2/9 Scores 03/09/2019 02/04/2019 07/14/2018 03/30/2018  PHQ - 2 Score 0 0 0 0  PHQ- 9 Score - 0 0 -     Cognitive Function     6CIT Screen 03/09/2019 03/01/2018  What Year? 0 points 0 points  What month? 0 points 0 points  What time? 0 points 0 points  Count back from 20 0 points 0 points  Months in reverse 0 points 0 points  Repeat phrase 0 points 2  points  Total Score 0 2    Immunization History  Administered Date(s) Administered  . Fluad Quad(high Dose 65+) 10/19/2018  . Influenza, High Dose Seasonal PF 12/23/2016  . Influenza-Unspecified 11/07/2014, 12/23/2016  . Pneumococcal Conjugate-13 11/07/2014  . Pneumococcal Polysaccharide-23 12/23/2016  . Zoster 11/07/2014    Qualifies for Shingles Vaccine? Yes  Zostavax completed 2016. Due for Shingrix. Education has been provided regarding the importance of this vaccine. Pt has been advised to call insurance company to determine out of pocket expense. Advised may also receive vaccine at local pharmacy or Health Dept. Verbalized acceptance and understanding.  Tdap: Although this vaccine is not a covered service during a Wellness Exam, does the patient still wish to receive this vaccine today?  No .  Education has been provided regarding the importance of this vaccine. Advised may receive this vaccine at local pharmacy or Health Dept. Aware to provide a copy of the vaccination record if obtained from local pharmacy or Health Dept. Verbalized acceptance and understanding.  Flu Vaccine: Up to date  Pneumococcal Vaccine: Up to date   Screening Tests Health Maintenance  Topic Date Due  . DEXA SCAN  10/04/2013  . MAMMOGRAM  08/24/2014  .  Hepatitis C Screening  10/19/2019 (Originally 1950-12-19)  . TETANUS/TDAP  02/25/2020 (Originally 10/05/1969)  . COLONOSCOPY  10/23/2022  . INFLUENZA VACCINE  Completed  . PNA vac Low Risk Adult  Completed    Cancer Screenings:  Colorectal Screening: Completed 10/22/17. Repeat every 5 years  Mammogram: Completed 07/3012. Repeat every year. Ordered today. Pt provided with contact information and advised to call to schedule appt.   Bone Density: Not completed. Ordered today. Pt provided with contact information and advised to call to schedule appt.   Lung Cancer Screening: (Low Dose CT Chest recommended if Age 32-80 years, 30 pack-year currently  smoking OR have quit w/in 15years.) does not qualify.   Additional Screening:  Hepatitis C Screening: does qualify; postponed  Vision Screening: Recommended annual ophthalmology exams for early detection of glaucoma and other disorders of the eye. Is the patient up to date with their annual eye exam?  No  Who is the provider or what is the name of the office in which the pt attends annual eye exams? [facility] Screening: Recommended annual dental exams for proper oral hygiene  Community Resource Referral:  CRR required this visit?  No        Plan:     I have personally reviewed and addressed the Medicare Annual Wellness questionnaire and have noted the following in the patient's chart:  A. Medical and social history B. Use of alcohol, tobacco or illicit drugs  C. Current medications and supplements D. Functional ability and status E.  Nutritional status F.  Physical activity G. Advance directives H. List of other physicians I.  Hospitalizations, surgeries, and ER visits in previous 12 months J.  [place] such as hearing and vision if needed, cognitive and depression L. Referrals and appointments   In addition, I have reviewed and discussed with patient certain preventive protocols, quality metrics, and best practice recommendations. A written personalized care plan for preventive services as well as general preventive health recommendations were provided to patient.   Signed,  [name], LPN Nurse Health Advisor   Nurse Notes: none

## 2019-03-14 ENCOUNTER — Ambulatory Visit: Payer: Medicare Other | Admitting: Student in an Organized Health Care Education/Training Program

## 2019-03-15 ENCOUNTER — Other Ambulatory Visit: Payer: Self-pay

## 2019-03-15 DIAGNOSIS — E039 Hypothyroidism, unspecified: Secondary | ICD-10-CM

## 2019-03-15 MED ORDER — LEVOTHYROXINE SODIUM 200 MCG PO TABS: ORAL_TABLET | ORAL | 1 refills | Status: DC

## 2019-03-21 ENCOUNTER — Other Ambulatory Visit: Payer: Self-pay

## 2019-03-21 DIAGNOSIS — I10 Essential (primary) hypertension: Secondary | ICD-10-CM

## 2019-03-21 MED ORDER — TRIAMTERENE-HCTZ 75-50 MG PO TABS: 1.0000 | ORAL_TABLET | Freq: Every day | ORAL | 0 refills | Status: DC

## 2019-03-24 DIAGNOSIS — Z79899 Other long term (current) drug therapy: Secondary | ICD-10-CM | POA: Diagnosis not present

## 2019-03-24 DIAGNOSIS — L4050 Arthropathic psoriasis, unspecified: Secondary | ICD-10-CM | POA: Diagnosis not present

## 2019-03-24 DIAGNOSIS — M79671 Pain in right foot: Secondary | ICD-10-CM | POA: Diagnosis not present

## 2019-03-24 DIAGNOSIS — M7552 Bursitis of left shoulder: Secondary | ICD-10-CM | POA: Insufficient documentation

## 2019-03-24 DIAGNOSIS — L409 Psoriasis, unspecified: Secondary | ICD-10-CM | POA: Diagnosis not present

## 2019-03-24 DIAGNOSIS — M19012 Primary osteoarthritis, left shoulder: Secondary | ICD-10-CM | POA: Diagnosis not present

## 2019-03-24 DIAGNOSIS — G8929 Other chronic pain: Secondary | ICD-10-CM | POA: Diagnosis not present

## 2019-03-25 ENCOUNTER — Ambulatory Visit
Admission: RE | Admit: 2019-03-25 | Discharge: 2019-03-25 | Disposition: A | Discharge status: Home / Self Care | Payer: Medicare Other | Source: Ambulatory Visit | Attending: Family Medicine | Admitting: Family Medicine

## 2019-03-25 ENCOUNTER — Ambulatory Visit (INDEPENDENT_AMBULATORY_CARE_PROVIDER_SITE_OTHER): Payer: Medicare Other | Admitting: Family Medicine

## 2019-03-25 ENCOUNTER — Other Ambulatory Visit: Payer: Self-pay

## 2019-03-25 ENCOUNTER — Ambulatory Visit
Admission: RE | Admit: 2019-03-25 | Discharge: 2019-03-25 | Disposition: A | Discharge status: Home / Self Care | Payer: Medicare Other | Attending: Family Medicine | Admitting: Family Medicine

## 2019-03-25 ENCOUNTER — Encounter: Payer: Self-pay | Admitting: Family Medicine

## 2019-03-25 VITALS — BP 140/90 | HR 72 | Temp 98.6°F | Ht 71.0 in | Wt 283.0 lb

## 2019-03-25 DIAGNOSIS — R519 Headache, unspecified: Secondary | ICD-10-CM | POA: Diagnosis not present

## 2019-03-25 DIAGNOSIS — J0111 Acute recurrent frontal sinusitis: Secondary | ICD-10-CM | POA: Diagnosis not present

## 2019-03-25 DIAGNOSIS — T7840XA Allergy, unspecified, initial encounter: Secondary | ICD-10-CM

## 2019-03-25 MED ORDER — AZITHROMYCIN 250 MG PO TABS: ORAL_TABLET | ORAL | 0 refills | Status: DC

## 2019-03-25 NOTE — Progress Notes (Addendum)
Date:  03/25/2019   Name:  Michele Moore   DOB:  1950/03/20   MRN:  [id number]   Chief Complaint: Headache (bloody nasal production x 2 months. Headache started off and on since November- got worse in the past 24 hrs since getting an injection in the shoulder.)  Headache  This is a new problem. The current episode started yesterday. The problem has been gradually improving. The pain is located in the bilateral and temporal region. The quality of the pain is described as aching. The pain is moderate. Associated symptoms include sinus pressure. Pertinent negatives include no abdominal pain, back pain, coughing, dizziness, ear pain, eye pain, eye redness, fever, nausea, numbness, photophobia, rhinorrhea, sore throat, vomiting or weakness. Associated symptoms comments: epistaxis.    Lab Results  Component Value Date   CREATININE 1.46 (H) 10/01/2018   BUN 26 (H) 10/01/2018   NA 140 10/01/2018   K 4.2 10/01/2018   CL 107 10/01/2018   CO2 25 10/01/2018   Lab Results  Component Value Date   CHOL 181 10/01/2018   HDL 43 10/01/2018   LDLCALC 111 (H) 10/01/2018   TRIG 133 10/01/2018   CHOLHDL 4.2 10/01/2018   Lab Results  Component Value Date   TSH 0.888 12/02/2018   Lab Results  Component Value Date   HGBA1C 5.4 10/01/2018     Review of Systems  Constitutional: Positive for chills. Negative for fatigue, fever and unexpected weight change.  HENT: Positive for facial swelling, nosebleeds and sinus pressure. Negative for congestion, ear discharge, ear pain, rhinorrhea, sneezing and sore throat.   Eyes: Negative for photophobia, pain, discharge, redness and itching.  Respiratory: Negative for cough, chest tightness, shortness of breath, wheezing and stridor.   Cardiovascular: Positive for leg swelling. Negative for chest pain and palpitations.  Gastrointestinal: Negative for abdominal pain, blood in stool, constipation, diarrhea, nausea and vomiting.  Endocrine: Negative for cold  intolerance, heat intolerance, polydipsia, polyphagia and polyuria.  Genitourinary: Negative for dysuria, flank pain, frequency, hematuria, menstrual problem, pelvic pain, urgency, vaginal bleeding and vaginal discharge.  Musculoskeletal: Negative for arthralgias, back pain and myalgias.  Skin: Negative for rash.  Allergic/Immunologic: Negative for environmental allergies and food allergies.  Neurological: Positive for headaches. Negative for dizziness, weakness, light-headedness and numbness.  Hematological: Negative for adenopathy. Does not bruise/bleed easily.  Psychiatric/Behavioral: Negative for dysphoric mood. The patient is not nervous/anxious.     Patient Active Problem List   Diagnosis Date Noted  . Benign essential hypertension 03/06/2019  . Proteinuria 03/06/2019  . Trochanteric bursitis of left hip 10/04/2018  . SI joint arthritis (left) 10/04/2018  . Exertional chest pain 09/30/2018  . CKD (chronic kidney disease), stage III 09/30/2018  . Psoriatic arthritis ([place]) 04/23/2018  . Trigger ring finger of left hand 04/14/2018  . Female pelvic pain 01/20/2018  . History of revision of total knee arthroplasty 01/20/2018  . Iron deficiency anemia   . Heme + stool   . Acute gastritis without hemorrhage   . Gastric polyps   . Benign neoplasm of ascending colon   . Benign neoplasm of transverse colon   . Polyp of sigmoid colon   . Hypertension 10/01/2017  . Hypothyroidism 10/01/2017  . Gastroesophageal reflux disease 10/01/2017  . Hypokalemia 10/01/2017  . Mixed hyperlipidemia 10/01/2017  . Reactive airway disease 10/01/2017  . Bradycardia 08/24/2017  . Severe obesity (BMI 35.0-35.9 with comorbidity) ([place]) 08/18/2017  . Chronic gouty arthropathy without tophi 06/25/2017  . Encounter for long-term (current)  use of high-risk medication 06/25/2017  . Positive ANA (antinuclear antibody) 06/17/2017  . Cervical spondylosis without myelopathy 06/16/2017  . Osteoarthritis of left  shoulder due to rotator cuff injury 06/16/2017  . Sprain of anterior talofibular ligament of right ankle 06/16/2017  . Lumbar radiculopathy 04/21/2017  . Lumbar degenerative disc disease 04/21/2017  . Chronic pain syndrome 04/21/2017  . Spinal stenosis, lumbar region, with neurogenic claudication 04/21/2017  . SS-A antibody positive 03/05/2017  . Pain, lower extremity 08/03/2015  . DOE (dyspnea on exertion) 12/28/2013    Allergies  Allergen Reactions  . Ampicillin Swelling  . Penicillins Anaphylaxis and Swelling    Has patient had a PCN reaction causing immediate rash, facial/tongue/throat swelling, SOB or lightheadedness with hypotension: Yes Has patient had a PCN reaction causing severe rash involving mucus membranes or skin necrosis: No Has patient had a PCN reaction that required hospitalization: No Has patient had a PCN reaction occurring within the last 10 years: Yes If all of the above answers are "NO", then may proceed with Cephalosporin use.  . Vancomycin     Other reaction(s): Other (see comments)  . Vibramycin [Doxycycline Calcium] Rash    Past Surgical History:  Procedure Laterality Date  . ANKLE SURGERY    . CARPAL TUNNEL RELEASE     x3  . COLONOSCOPY  2000?  . COLONOSCOPY WITH PROPOFOL N/A 10/22/2017   Procedure: COLONOSCOPY WITH PROPOFOL;  Surgeon: Lucilla Lame, MD;  Location: [facility];  Service: Endoscopy;  Laterality: N/A;  . ESOPHAGOGASTRODUODENOSCOPY (EGD) WITH PROPOFOL N/A 10/22/2017   Procedure: ESOPHAGOGASTRODUODENOSCOPY (EGD) WITH PROPOFOL;  Surgeon: Lucilla Lame, MD;  Location: [facility];  Service: Endoscopy;  Laterality: N/A;  . FUSION OF TALONAVICULAR JOINT Right 08/03/2015   Procedure: TAILOR NAVICULAR JOINT FUSION - RIGHT ;  Surgeon: [name], DPM;  Location: [facility];  Service: Podiatry;  Laterality: Right;  . JOINT REPLACEMENT     knee x 3 ,right x1 and left x2  . POLYPECTOMY N/A 10/22/2017   Procedure: POLYPECTOMY;  Surgeon:  Lucilla Lame, MD;  Location: [facility];  Service: Endoscopy;  Laterality: N/A;  . ROTATOR CUFF REPAIR    . TONSILLECTOMY    . TOTAL KNEE REVISION Right 01/20/2018   Procedure: POLYETHYLENE EXCHANGE;  Surgeon: Dereck Leep, MD;  Location: [facility];  Service: Orthopedics;  Laterality: Right;  . TUBAL LIGATION    . UPPER GI ENDOSCOPY  2000?    Social History   Tobacco Use  . Smoking status: Former Smoker    Quit date: 02/24/1993    Years since quitting: 26.0  . Smokeless tobacco: Never Used  Substance Use Topics  . Alcohol use: No    Alcohol/week: 0.0 standard drinks  . Drug use: No     Medication list has been reviewed and updated.  Current Meds  Medication Sig  . albuterol (PROVENTIL HFA;VENTOLIN HFA) 108 (90 Base) MCG/ACT inhaler Inhale 2 puffs into the lungs every 4 (four) hours as needed for wheezing.  Marland Kitchen allopurinol (ZYLOPRIM) 100 MG tablet Take 200 mg by mouth daily.   . ASPIRIN 81 PO Take 81 mg by mouth.  . benzonatate (TESSALON) 100 MG capsule Take 1 capsule (100 mg total) by mouth 2 (two) times daily as needed for cough.  . Cholecalciferol (VITAMIN D3) 2000 units TABS Take 2,000 Units by mouth daily.  . diclofenac sodium (VOLTAREN) 1 % GEL Apply 2 g topically 3 (three) times daily.   . ferrous sulfate 325 (65 FE) MG  tablet Take 325 mg by mouth daily with breakfast.   . fluticasone (FLONASE) 50 MCG/ACT nasal spray SHAKE LIQUID AND USE 1 SPRAY IN EACH NOSTRIL DAILY  . gabapentin (NEURONTIN) 600 MG tablet Take 1 tablet (600 mg total) by mouth at bedtime.  . hydrALAZINE (APRESOLINE) 50 MG tablet Take 1 tablet (50 mg total) by mouth 2 (two) times daily.  Derrill Memo ON 04/01/2019] HYDROcodone-acetaminophen (NORCO) 7.5-325 MG tablet Take 1 tablet by mouth every 8 (eight) hours as needed for severe pain. Must last 30 days.  Derrill Memo ON 05/01/2019] HYDROcodone-acetaminophen (NORCO) 7.5-325 MG tablet Take 1 tablet by mouth every 8 (eight) hours as needed for severe pain. Must  last 30 days.  Marland Kitchen ketoconazole (NIZORAL) 2 % cream APP AA ON THE FEET HS FOR FUNGAL INFECTION.  Marland Kitchen levothyroxine (SYNTHROID) 200 MCG tablet TAKE 1 TABLET(200 MCG) BY MOUTH DAILY BEFORE BREAKFAST  . losartan (COZAAR) 25 MG tablet Take 25 mg by mouth daily.  . meclizine (ANTIVERT) 12.5 MG tablet Take 1 tablet (12.5 mg total) by mouth 2 (two) times daily as needed for dizziness.  . methotrexate (RHEUMATREX) 2.5 MG tablet Take 20 mg by mouth every Friday. Take 5 tablets by mouth once weekly  . Multiple Vitamins-Calcium (ONE-A-DAY WOMENS FORMULA PO) Take 1 tablet by mouth daily.  Marland Kitchen omega-3 acid ethyl esters (LOVAZA) 1 g capsule Take 1 capsule (1 g total) by mouth 2 (two) times daily.  Marland Kitchen omeprazole (PRILOSEC) 40 MG capsule TAKE 1 CAPSULE(40 MG) BY MOUTH DAILY (Patient taking differently: Take 40 mg by mouth daily. )  . Polyethyl Glycol-Propyl Glycol (SYSTANE OP) Place 1 drop into both eyes daily as needed (dry eyes).   . potassium chloride SA (K-DUR) 20 MEQ tablet Take 0.5 tablets (10 mEq total) by mouth daily.  Marland Kitchen tiZANidine (ZANAFLEX) 4 MG tablet Take 1 tablet (4 mg total) by mouth 2 (two) times daily as needed for muscle spasms.  Marland Kitchen triamcinolone cream (KENALOG) 0.1 % Apply 1 application topically every morning.  . triamterene-hydrochlorothiazide (MAXZIDE) 75-50 MG tablet Take 1 tablet by mouth daily.    PHQ 2/9 Scores 03/09/2019 02/04/2019 07/14/2018 03/30/2018  PHQ - 2 Score 0 0 0 0  PHQ- 9 Score - 0 0 -    BP Readings from Last 3 Encounters:  03/25/19 140/90  02/28/19 (!) 154/76  10/19/18 120/80    Physical Exam Vitals and nursing note reviewed.  Constitutional:      General: She is not in acute distress.    Appearance: She is not diaphoretic.  HENT:     Head: Normocephalic and atraumatic.     Jaw: There is normal jaw occlusion.     Right Ear: Hearing, tympanic membrane, ear canal and external ear normal.     Left Ear: Hearing, tympanic membrane, ear canal and external ear normal.      Nose:     Right Turbinates: Swollen.     Left Turbinates: Swollen.     Right Sinus: Maxillary sinus tenderness and frontal sinus tenderness present.     Left Sinus: Maxillary sinus tenderness and frontal sinus tenderness present.     Mouth/Throat:     Lips: Pink.     Mouth: Mucous membranes are moist.     Pharynx: No posterior oropharyngeal erythema.  Eyes:     General:        Right eye: No discharge.        Left eye: No discharge.     Conjunctiva/sclera: Conjunctivae normal.  Pupils: Pupils are equal, round, and reactive to light.  Neck:     Thyroid: No thyroid mass, thyromegaly or thyroid tenderness.     Vascular: Normal carotid pulses. No carotid bruit, hepatojugular reflux or JVD.  Cardiovascular:     Rate and Rhythm: Normal rate and regular rhythm.     Pulses: Normal pulses.     Heart sounds: Normal heart sounds, S1 normal and S2 normal. No murmur. No systolic murmur. No diastolic murmur. No friction rub. No gallop. No S3 or S4 sounds.      Comments: No erythema Pulmonary:     Effort: Pulmonary effort is normal.     Breath sounds: Normal breath sounds. No wheezing or rhonchi.  Abdominal:     General: Bowel sounds are normal.     Palpations: Abdomen is soft. There is no mass.     Tenderness: There is no abdominal tenderness. There is no guarding.  Musculoskeletal:        General: Normal range of motion.     Cervical back: Full passive range of motion without pain, normal range of motion and neck supple. Normal range of motion.     Right lower leg: 1+ Edema present.     Left lower leg: 1+ Edema present.  Lymphadenopathy:     Cervical: No cervical adenopathy.     Right cervical: No superficial, deep or posterior cervical adenopathy.    Left cervical: No superficial, deep or posterior cervical adenopathy.  Skin:    General: Skin is warm and dry.  Neurological:     Mental Status: She is alert.     Deep Tendon Reflexes: Reflexes are normal and symmetric.     Wt  Readings from Last 3 Encounters:  03/25/19 283 lb (128.4 kg)  02/28/19 260 lb (117.9 kg)  10/19/18 260 lb (117.9 kg)    BP 140/90   Pulse 72   Temp 98.6 F (37 C) (Oral)   Ht 5\' 11"  (1.803 m)   Wt 283 lb (128.4 kg)   BMI 39.47 kg/m   Assessment and Plan:  1. Acute recurrent frontal sinusitis Acute.  Recurrent.  Exam is consistent with tenderness over the frontal and maxillary sinusitis's.  Patient has tissue with some blood-tinged sputum from the nasal discharge.  Consistent with a possible infection of the sinuses we will initiate a azithromycin to 50 mg 2 today followed by 1 a day for 4 days and we will get an x-ray of her sinuses total to take a look to see if there is any opacification.  Patient was informed that I would be unable to give her any more medications for her pain due to her renal insufficiency and she is currently getting hydrocodone from Dr. Holley Raring. - DG Sinuses Complete; Future - azithromycin (ZITHROMAX) 250 MG tablet; 2 today then 1 a day for 4 days  Dispense: 6 tablet; Refill: 0  2. Allergic reaction, initial encounter Patient has some erythema of the facial area with some mild swelling which looks as if she may have had an allergic reaction to some type of medication or food circumstance.  I have suggested that she pick up some over-the-counter Benadryl to decrease the swelling she is not having any wheezing or dyspnea of symptoms.  In addition as the patient is leaving she was asking about the swelling of her legs which was noted but there was no erythema or tenderness.  Patient has been instructed if she should start getting short of breath she is to  go to the hospital.  She is currently taking triamterene and review of her most recent electrolytes notes that her potassium and sodium are normal.

## 2019-04-01 ENCOUNTER — Other Ambulatory Visit: Payer: Self-pay

## 2019-04-01 DIAGNOSIS — I10 Essential (primary) hypertension: Secondary | ICD-10-CM

## 2019-04-01 MED ORDER — HYDRALAZINE HCL 50 MG PO TABS: 50.0000 mg | ORAL_TABLET | Freq: Two times a day (BID) | ORAL | 0 refills | Status: DC

## 2019-04-06 ENCOUNTER — Ambulatory Visit
Admission: RE | Admit: 2019-04-06 | Discharge: 2019-04-06 | Disposition: A | Discharge status: Home / Self Care | Payer: Medicare Other | Source: Ambulatory Visit | Attending: Family Medicine | Admitting: Family Medicine

## 2019-04-06 ENCOUNTER — Encounter (INDEPENDENT_AMBULATORY_CARE_PROVIDER_SITE_OTHER): Payer: Self-pay

## 2019-04-06 ENCOUNTER — Other Ambulatory Visit: Payer: Self-pay

## 2019-04-06 DIAGNOSIS — Z1231 Encounter for screening mammogram for malignant neoplasm of breast: Secondary | ICD-10-CM | POA: Insufficient documentation

## 2019-04-06 DIAGNOSIS — Z780 Asymptomatic menopausal state: Secondary | ICD-10-CM | POA: Diagnosis not present

## 2019-04-12 ENCOUNTER — Encounter: Payer: Self-pay | Admitting: Family Medicine

## 2019-04-12 ENCOUNTER — Ambulatory Visit (INDEPENDENT_AMBULATORY_CARE_PROVIDER_SITE_OTHER): Payer: Medicare Other | Admitting: Family Medicine

## 2019-04-12 ENCOUNTER — Telehealth: Payer: Self-pay

## 2019-04-12 ENCOUNTER — Other Ambulatory Visit: Payer: Self-pay

## 2019-04-12 VITALS — BP 138/72 | HR 72 | Temp 98.7°F | Ht 71.0 in | Wt 278.1 lb

## 2019-04-12 DIAGNOSIS — R040 Epistaxis: Secondary | ICD-10-CM

## 2019-04-12 DIAGNOSIS — E039 Hypothyroidism, unspecified: Secondary | ICD-10-CM | POA: Diagnosis not present

## 2019-04-12 DIAGNOSIS — J4520 Mild intermittent asthma, uncomplicated: Secondary | ICD-10-CM

## 2019-04-12 DIAGNOSIS — E876 Hypokalemia: Secondary | ICD-10-CM

## 2019-04-12 DIAGNOSIS — J3089 Other allergic rhinitis: Secondary | ICD-10-CM

## 2019-04-12 DIAGNOSIS — K219 Gastro-esophageal reflux disease without esophagitis: Secondary | ICD-10-CM

## 2019-04-12 DIAGNOSIS — I10 Essential (primary) hypertension: Secondary | ICD-10-CM | POA: Diagnosis not present

## 2019-04-12 DIAGNOSIS — E782 Mixed hyperlipidemia: Secondary | ICD-10-CM | POA: Diagnosis not present

## 2019-04-12 MED ORDER — TRIAMTERENE-HCTZ 75-50 MG PO TABS: 1.0000 | ORAL_TABLET | Freq: Every day | ORAL | 0 refills | Status: DC

## 2019-04-12 MED ORDER — LOSARTAN POTASSIUM 25 MG PO TABS: 25.0000 mg | ORAL_TABLET | Freq: Every day | ORAL | 1 refills | Status: DC

## 2019-04-12 MED ORDER — HYDRALAZINE HCL 50 MG PO TABS: 50.0000 mg | ORAL_TABLET | Freq: Two times a day (BID) | ORAL | 0 refills | Status: DC

## 2019-04-12 MED ORDER — MOMETASONE FUROATE 50 MCG/ACT NA SUSP: 2.0000 | Freq: Every day | NASAL | 12 refills | Status: DC

## 2019-04-12 MED ORDER — OMEGA-3-ACID ETHYL ESTERS 1 G PO CAPS: 1.0000 g | ORAL_CAPSULE | Freq: Two times a day (BID) | ORAL | 1 refills | Status: DC

## 2019-04-12 MED ORDER — OMEPRAZOLE 40 MG PO CPDR: 40.0000 mg | DELAYED_RELEASE_CAPSULE | Freq: Every day | ORAL | 1 refills | Status: DC

## 2019-04-12 MED ORDER — LEVOTHYROXINE SODIUM 200 MCG PO TABS: ORAL_TABLET | ORAL | 1 refills | Status: DC

## 2019-04-12 MED ORDER — ALBUTEROL SULFATE HFA 108 (90 BASE) MCG/ACT IN AERS: 2.0000 | INHALATION_SPRAY | RESPIRATORY_TRACT | 11 refills | Status: DC | PRN

## 2019-04-12 MED ORDER — POTASSIUM CHLORIDE CRYS ER 20 MEQ PO TBCR: 10.0000 meq | EXTENDED_RELEASE_TABLET | Freq: Every day | ORAL | 1 refills | Status: DC

## 2019-04-12 NOTE — Telephone Encounter (Addendum)
The pt called wanting to know if you can change her nasal spray over to something covered by her insurance. She was seen in the office on yesterday. The Nasonex is no other covered.

## 2019-04-12 NOTE — Progress Notes (Signed)
Date:  04/12/2019   Name:  Michele Moore   DOB:  06/12/1950   MRN:  [id number]   Chief Complaint: Epistaxis (Intermittent nose bleed x 3 mths. Also coughing up blood when she clear out her throat. )  Epistaxis  The bleeding has been from both nares. This is a recurrent problem. The current episode started more than 1 month ago. The problem occurs daily. The problem has been unchanged. The bleeding is associated with aspirin. She has tried nothing for the symptoms. Her past medical history is significant for allergies. There is no history of a bleeding disorder, colds, frequent nosebleeds or sinus problems.  Hypertension This is a chronic problem. The current episode started more than 1 year ago. The problem has been gradually improving since onset. The problem is controlled. Pertinent negatives include no anxiety, blurred vision, chest pain, headaches, malaise/fatigue, neck pain, orthopnea, palpitations, peripheral edema, PND, shortness of breath or sweats. There are no associated agents to hypertension. Risk factors for coronary artery disease include dyslipidemia. Past treatments include angiotensin blockers, diuretics and direct vasodilators. The current treatment provides moderate improvement. There are no compliance problems.  There is no history of angina, kidney disease, CAD/MI, CVA, heart failure, left ventricular hypertrophy, PVD or retinopathy. Identifiable causes of hypertension include a thyroid problem. There is no history of chronic renal disease, a hypertension causing med or renovascular disease.  Thyroid Problem Presents for follow-up visit. Symptoms include hair loss and weight gain. Patient reports no anxiety, cold intolerance, constipation, depressed mood, diaphoresis, diarrhea, dry skin, fatigue, palpitations or weight loss. There is no history of heart failure.    Lab Results  Component Value Date   CREATININE 1.46 (H) 10/01/2018   BUN 26 (H) 10/01/2018   NA 140 10/01/2018    K 4.2 10/01/2018   CL 107 10/01/2018   CO2 25 10/01/2018   Lab Results  Component Value Date   CHOL 181 10/01/2018   HDL 43 10/01/2018   LDLCALC 111 (H) 10/01/2018   TRIG 133 10/01/2018   CHOLHDL 4.2 10/01/2018   Lab Results  Component Value Date   TSH 0.888 12/02/2018   Lab Results  Component Value Date   HGBA1C 5.4 10/01/2018     Review of Systems  Constitutional: Positive for weight gain. Negative for chills, diaphoresis, fatigue, fever, malaise/fatigue and weight loss.  HENT: Positive for nosebleeds. Negative for drooling, ear discharge, ear pain and sore throat.   Eyes: Negative for blurred vision.  Respiratory: Negative for cough, shortness of breath and wheezing.   Cardiovascular: Negative for chest pain, palpitations, orthopnea, leg swelling and PND.  Gastrointestinal: Negative for abdominal pain, blood in stool, constipation, diarrhea and nausea.  Endocrine: Negative for cold intolerance and polydipsia.  Genitourinary: Negative for dysuria, frequency, hematuria and urgency.  Musculoskeletal: Negative for back pain, myalgias and neck pain.  Skin: Negative for rash.  Allergic/Immunologic: Negative for environmental allergies.  Neurological: Negative for dizziness and headaches.  Hematological: Does not bruise/bleed easily.  Psychiatric/Behavioral: Negative for suicidal ideas. The patient is not nervous/anxious.     Patient Active Problem List   Diagnosis Date Noted  . Benign essential hypertension 03/06/2019  . Proteinuria 03/06/2019  . Trochanteric bursitis of left hip 10/04/2018  . SI joint arthritis (left) 10/04/2018  . Exertional chest pain 09/30/2018  . CKD (chronic kidney disease), stage III 09/30/2018  . Psoriatic arthritis ([place]) 04/23/2018  . Trigger ring finger of left hand 04/14/2018  . Female pelvic pain 01/20/2018  .  History of revision of total knee arthroplasty 01/20/2018  . Iron deficiency anemia   . Heme + stool   . Acute gastritis  without hemorrhage   . Gastric polyps   . Benign neoplasm of ascending colon   . Benign neoplasm of transverse colon   . Polyp of sigmoid colon   . Hypertension 10/01/2017  . Hypothyroidism 10/01/2017  . Gastroesophageal reflux disease 10/01/2017  . Hypokalemia 10/01/2017  . Mixed hyperlipidemia 10/01/2017  . Reactive airway disease 10/01/2017  . Bradycardia 08/24/2017  . Severe obesity (BMI 35.0-35.9 with comorbidity) ([place]) 08/18/2017  . Chronic gouty arthropathy without tophi 06/25/2017  . Encounter for long-term (current) use of high-risk medication 06/25/2017  . Positive ANA (antinuclear antibody) 06/17/2017  . Cervical spondylosis without myelopathy 06/16/2017  . Osteoarthritis of left shoulder due to rotator cuff injury 06/16/2017  . Sprain of anterior talofibular ligament of right ankle 06/16/2017  . Lumbar radiculopathy 04/21/2017  . Lumbar degenerative disc disease 04/21/2017  . Chronic pain syndrome 04/21/2017  . Spinal stenosis, lumbar region, with neurogenic claudication 04/21/2017  . SS-A antibody positive 03/05/2017  . Pain, lower extremity 08/03/2015  . DOE (dyspnea on exertion) 12/28/2013    Allergies  Allergen Reactions  . Ampicillin Swelling  . Penicillins Anaphylaxis and Swelling    Has patient had a PCN reaction causing immediate rash, facial/tongue/throat swelling, SOB or lightheadedness with hypotension: Yes Has patient had a PCN reaction causing severe rash involving mucus membranes or skin necrosis: No Has patient had a PCN reaction that required hospitalization: No Has patient had a PCN reaction occurring within the last 10 years: Yes If all of the above answers are "NO", then may proceed with Cephalosporin use.  . Vancomycin     Other reaction(s): Other (see comments)  . Vibramycin [Doxycycline Calcium] Rash    Past Surgical History:  Procedure Laterality Date  . ANKLE SURGERY    . CARPAL TUNNEL RELEASE     x3  . COLONOSCOPY  2000?  .  COLONOSCOPY WITH PROPOFOL N/A 10/22/2017   Procedure: COLONOSCOPY WITH PROPOFOL;  Surgeon: Lucilla Lame, MD;  Location: [facility];  Service: Endoscopy;  Laterality: N/A;  . ESOPHAGOGASTRODUODENOSCOPY (EGD) WITH PROPOFOL N/A 10/22/2017   Procedure: ESOPHAGOGASTRODUODENOSCOPY (EGD) WITH PROPOFOL;  Surgeon: Lucilla Lame, MD;  Location: [facility];  Service: Endoscopy;  Laterality: N/A;  . FUSION OF TALONAVICULAR JOINT Right 08/03/2015   Procedure: TAILOR NAVICULAR JOINT FUSION - RIGHT ;  Surgeon: [name], DPM;  Location: [facility];  Service: Podiatry;  Laterality: Right;  . JOINT REPLACEMENT     knee x 3 ,right x1 and left x2  . POLYPECTOMY N/A 10/22/2017   Procedure: POLYPECTOMY;  Surgeon: Lucilla Lame, MD;  Location: [facility];  Service: Endoscopy;  Laterality: N/A;  . ROTATOR CUFF REPAIR    . TONSILLECTOMY    . TOTAL KNEE REVISION Right 01/20/2018   Procedure: POLYETHYLENE EXCHANGE;  Surgeon: Dereck Leep, MD;  Location: [facility];  Service: Orthopedics;  Laterality: Right;  . TUBAL LIGATION    . UPPER GI ENDOSCOPY  2000?    Social History   Tobacco Use  . Smoking status: Former Smoker    Quit date: 02/24/1993    Years since quitting: 26.1  . Smokeless tobacco: Never Used  Substance Use Topics  . Alcohol use: No    Alcohol/week: 0.0 standard drinks  . Drug use: No     Medication list has been reviewed and updated.  Current Meds  Medication  Sig  . albuterol (PROVENTIL HFA;VENTOLIN HFA) 108 (90 Base) MCG/ACT inhaler Inhale 2 puffs into the lungs every 4 (four) hours as needed for wheezing.  Marland Kitchen allopurinol (ZYLOPRIM) 100 MG tablet Take 200 mg by mouth daily.   . ASPIRIN 81 PO Take 81 mg by mouth.  . benzonatate (TESSALON) 100 MG capsule Take 1 capsule (100 mg total) by mouth 2 (two) times daily as needed for cough.  . Cholecalciferol (VITAMIN D3) 2000 units TABS Take 2,000 Units by mouth daily.  . diclofenac sodium (VOLTAREN) 1 % GEL Apply 2 g  topically 3 (three) times daily.   . ferrous sulfate 325 (65 FE) MG tablet Take 325 mg by mouth daily with breakfast.   . fluticasone (FLONASE) 50 MCG/ACT nasal spray SHAKE LIQUID AND USE 1 SPRAY IN EACH NOSTRIL DAILY  . gabapentin (NEURONTIN) 600 MG tablet Take 1 tablet (600 mg total) by mouth at bedtime.  . hydrALAZINE (APRESOLINE) 50 MG tablet Take 1 tablet (50 mg total) by mouth 2 (two) times daily.  Marland Kitchen HYDROcodone-acetaminophen (NORCO) 7.5-325 MG tablet Take 1 tablet by mouth every 8 (eight) hours as needed for severe pain. Must last 30 days.  Derrill Memo ON 05/01/2019] HYDROcodone-acetaminophen (NORCO) 7.5-325 MG tablet Take 1 tablet by mouth every 8 (eight) hours as needed for severe pain. Must last 30 days.  Marland Kitchen ketoconazole (NIZORAL) 2 % cream APP AA ON THE FEET HS FOR FUNGAL INFECTION.  Marland Kitchen levothyroxine (SYNTHROID) 200 MCG tablet TAKE 1 TABLET(200 MCG) BY MOUTH DAILY BEFORE BREAKFAST  . losartan (COZAAR) 25 MG tablet Take 25 mg by mouth daily.  . meclizine (ANTIVERT) 12.5 MG tablet Take 1 tablet (12.5 mg total) by mouth 2 (two) times daily as needed for dizziness.  . methotrexate (RHEUMATREX) 2.5 MG tablet Take 20 mg by mouth every Friday. Take 5 tablets by mouth once weekly  . Multiple Vitamins-Calcium (ONE-A-DAY WOMENS FORMULA PO) Take 1 tablet by mouth daily.  Marland Kitchen omeprazole (PRILOSEC) 40 MG capsule TAKE 1 CAPSULE(40 MG) BY MOUTH DAILY (Patient taking differently: Take 40 mg by mouth daily. )  . Polyethyl Glycol-Propyl Glycol (SYSTANE OP) Place 1 drop into both eyes daily as needed (dry eyes).   . potassium chloride SA (K-DUR) 20 MEQ tablet Take 0.5 tablets (10 mEq total) by mouth daily.  Marland Kitchen tiZANidine (ZANAFLEX) 4 MG tablet Take 1 tablet (4 mg total) by mouth 2 (two) times daily as needed for muscle spasms.  Marland Kitchen triamcinolone cream (KENALOG) 0.1 % Apply 1 application topically every morning.  . triamterene-hydrochlorothiazide (MAXZIDE) 75-50 MG tablet Take 1 tablet by mouth daily.    PHQ 2/9  Scores 03/09/2019 02/04/2019 07/14/2018 03/30/2018  PHQ - 2 Score 0 0 0 0  PHQ- 9 Score - 0 0 -    BP Readings from Last 3 Encounters:  04/12/19 138/72  03/25/19 140/90  02/28/19 (!) 154/76    Physical Exam Vitals and nursing note reviewed.  Constitutional:      General: She is not in acute distress.    Appearance: She is not diaphoretic.  HENT:     Head: Normocephalic and atraumatic.     Right Ear: Tympanic membrane, ear canal and external ear normal.     Left Ear: Tympanic membrane, ear canal and external ear normal.     Nose: Nose normal. No congestion or rhinorrhea.  Eyes:     General:        Right eye: No discharge.        Left eye: No  discharge.     Conjunctiva/sclera: Conjunctivae normal.     Pupils: Pupils are equal, round, and reactive to light.  Neck:     Thyroid: No thyromegaly.     Vascular: No JVD.  Cardiovascular:     Rate and Rhythm: Normal rate and regular rhythm.     Heart sounds: Normal heart sounds. No murmur. No friction rub. No gallop.   Pulmonary:     Effort: Pulmonary effort is normal.     Breath sounds: Normal breath sounds.  Abdominal:     General: Bowel sounds are normal.     Palpations: Abdomen is soft. There is no mass.     Tenderness: There is no abdominal tenderness. There is no guarding.  Musculoskeletal:        General: Normal range of motion.     Cervical back: Normal range of motion and neck supple.  Lymphadenopathy:     Cervical: No cervical adenopathy.  Skin:    General: Skin is warm and dry.  Neurological:     Mental Status: She is alert.     Deep Tendon Reflexes: Reflexes are normal and symmetric.     Wt Readings from Last 3 Encounters:  04/12/19 278 lb 1.6 oz (126.1 kg)  03/25/19 283 lb (128.4 kg)  02/28/19 260 lb (117.9 kg)    BP 138/72   Pulse 72   Temp 98.7 F (37.1 C) (Oral)   Ht 5\' 11"  (1.803 m)   Wt 278 lb 1.6 oz (126.1 kg)   SpO2 99%   BMI 38.79 kg/m   Assessment and Plan:   1. Hypertension, unspecified  type Chronic.  Controlled.  Stable.  Continue hydralazine 50 mg twice a day, triamterene 75-50 once a day, and losartan 25 mg once a day.  Will check renal function panel to evaluate GFR. - hydrALAZINE (APRESOLINE) 50 MG tablet; Take 1 tablet (50 mg total) by mouth 2 (two) times daily.  Dispense: 60 tablet; Refill: 0 - triamterene-hydrochlorothiazide (MAXZIDE) 75-50 MG tablet; Take 1 tablet by mouth daily.  Dispense: 30 tablet; Refill: 0 - losartan (COZAAR) 25 MG tablet; Take 1 tablet (25 mg total) by mouth daily.  Dispense: 90 tablet; Refill: 1 - Renal Function Panel  2. Hypothyroidism, unspecified type Chronic.  Controlled.  Stable.  Continue levothyroxine 200 mcg once a day.  Will check thyroid panel with TSH. - levothyroxine (SYNTHROID) 200 MCG tablet; TAKE 1 TABLET(200 MCG) BY MOUTH DAILY BEFORE BREAKFAST  Dispense: 90 tablet; Refill: 1 - Thyroid Panel With TSH  3. Hypokalemia Chronic.  Controlled.  Stable.  Continue potassium 0.5 tablets) of 10 mEq daily.  Will check renal function panel. - potassium chloride SA (KLOR-CON) 20 MEQ tablet; Take 0.5 tablets (10 mEq total) by mouth daily.  Dispense: 90 tablet; Refill: 1 - Renal Function Panel  4. Mixed hyperlipidemia Chronic.  Controlled.  Stable.  Continue Lovaza 1 g twice a day.  Will check lipid panel for evaluation of LDL range. - omega-3 acid ethyl esters (LOVAZA) 1 g capsule; Take 1 capsule (1 g total) by mouth 2 (two) times daily.  Dispense: 180 capsule; Refill: 1 - Lipid Panel With LDL/HDL Ratio  5. Gastroesophageal reflux disease Chronic.  Controlled.  Stable.  Continue omeprazole 40 mg once a day. - omeprazole (PRILOSEC) 40 MG capsule; Take 1 capsule (40 mg total) by mouth daily.  Dispense: 90 capsule; Refill: 1  6. Epistaxis, recurrent New onset.  Persistent.  Episodic.  Stable but uncontrolled.  Patient is having daily  epistasis episodes we will refer to ear nose and throat.  Exam is consistent with some residual blood in the  left nostril.  Will check CBC to evaluate for anemia. - CBC with Differential/Platelet - Ambulatory referral to ENT  7. Mild intermittent reactive airway disease without complication Chronic.  Controlled.  Stable.  Continue albuterol inhaler on a as needed basis.  8. Non-seasonal allergic rhinitis, unspecified trigger Chronic.  Controlled.  Stable.  Patient's been taking fluticasone which is alcohol base this has been discontinued secondary to possible irritation of the nose causing bleeding from the drying effect of the alcohol base.  We will switch to Nasonex aqueous base 2 sprays in each nostril once a day. - mometasone (NASONEX) 50 MCG/ACT nasal spray; Place 2 sprays into the nose daily.  Dispense: 17 g; Refill: 12

## 2019-04-13 LAB — CBC WITH DIFFERENTIAL/PLATELET
Basophils Absolute: 0 10*3/uL (ref 0.0–0.2)
Basos: 1 %
EOS (ABSOLUTE): 0.1 10*3/uL (ref 0.0–0.4)
Eos: 2 %
Hematocrit: 33.9 % — ABNORMAL LOW (ref 34.0–46.6)
Hemoglobin: 11.1 g/dL (ref 11.1–15.9)
Immature Grans (Abs): 0 10*3/uL (ref 0.0–0.1)
Immature Granulocytes: 0 %
Lymphocytes Absolute: 1.3 10*3/uL (ref 0.7–3.1)
Lymphs: 29 %
MCH: 30.3 pg (ref 26.6–33.0)
MCHC: 32.7 g/dL (ref 31.5–35.7)
MCV: 93 fL (ref 79–97)
Monocytes Absolute: 0.4 10*3/uL (ref 0.1–0.9)
Monocytes: 8 %
Neutrophils Absolute: 2.7 10*3/uL (ref 1.4–7.0)
Neutrophils: 60 %
Platelets: 162 10*3/uL (ref 150–450)
RBC: 3.66 x10E6/uL — ABNORMAL LOW (ref 3.77–5.28)
RDW: 14.1 % (ref 11.7–15.4)
WBC: 4.5 10*3/uL (ref 3.4–10.8)

## 2019-04-13 LAB — RENAL FUNCTION PANEL
Albumin: 4.3 g/dL (ref 3.8–4.8)
BUN/Creatinine Ratio: 17 (ref 12–28)
BUN: 31 mg/dL — ABNORMAL HIGH (ref 8–27)
CO2: 24 mmol/L (ref 20–29)
Calcium: 9.3 mg/dL (ref 8.7–10.3)
Chloride: 107 mmol/L — ABNORMAL HIGH (ref 96–106)
Creatinine, Ser: 1.79 mg/dL — ABNORMAL HIGH (ref 0.57–1.00)
GFR calc Af Amer: 33 mL/min/{1.73_m2} — ABNORMAL LOW (ref >59)
GFR calc non Af Amer: 28 mL/min/{1.73_m2} — ABNORMAL LOW (ref >59)
Glucose: 65 mg/dL (ref 65–99)
Phosphorus: 4 mg/dL (ref 3.0–4.3)
Potassium: 4.7 mmol/L (ref 3.5–5.2)
Sodium: 145 mmol/L — ABNORMAL HIGH (ref 134–144)

## 2019-04-13 LAB — LIPID PANEL WITH LDL/HDL RATIO
Cholesterol, Total: 199 mg/dL (ref 100–199)
HDL: 84 mg/dL (ref >39)
LDL Chol Calc (NIH): 102 mg/dL — ABNORMAL HIGH (ref 0–99)
LDL/HDL Ratio: 1.2 ratio (ref 0.0–3.2)
Triglycerides: 69 mg/dL (ref 0–149)
VLDL Cholesterol Cal: 13 mg/dL (ref 5–40)

## 2019-04-13 LAB — THYROID PANEL WITH TSH
Free Thyroxine Index: 3.5 (ref 1.2–4.9)
T3 Uptake Ratio: 29 % (ref 24–39)
T4, Total: 12 ug/dL (ref 4.5–12.0)
TSH: 0.386 u[IU]/mL — ABNORMAL LOW (ref 0.450–4.500)

## 2019-04-13 NOTE — Telephone Encounter (Signed)
I called pt and told her that we cannot change her back to flonase due to nosebleeds. I placed a message to Dr Kathyrn Sheriff and staff to call pt concerning this matter. Pt said the nurse was supposed to call her and hasn't.

## 2019-04-15 ENCOUNTER — Telehealth: Payer: Self-pay

## 2019-04-15 NOTE — Telephone Encounter (Signed)
She fell and dislocated her knee and hurt her back. Her ribs are hurting. She wanted to let us know and see if she can get a procedure.

## 2019-04-15 NOTE — Telephone Encounter (Signed)
Returned patient call, she would like to report that she had a fall on yesterday and has injured her right knee and lower back.  States she is having to drag that right leg when she walks.  I told patient that she needed to have that evaluated today either with her PCP or Urgent Care or ED.  She states she will get dressed and do this.  She also, would like to reschedule shoulder injection with Dr Holley Raring, she had to cancel last procedure because she was sick.  I told her I would send message to secretaries to call her on Monday to schedule, as there is an order for this and to also see how she is doing with this fall.  Patient verbalizes u/o information given and is going to be evaluated today.

## 2019-04-18 ENCOUNTER — Telehealth: Payer: Medicare Other | Admitting: Pain Medicine

## 2019-04-18 ENCOUNTER — Other Ambulatory Visit: Payer: Self-pay

## 2019-04-18 DIAGNOSIS — R040 Epistaxis: Secondary | ICD-10-CM | POA: Diagnosis not present

## 2019-04-18 DIAGNOSIS — I10 Essential (primary) hypertension: Secondary | ICD-10-CM

## 2019-04-18 DIAGNOSIS — G4733 Obstructive sleep apnea (adult) (pediatric): Secondary | ICD-10-CM | POA: Diagnosis not present

## 2019-04-18 MED ORDER — TRIAMTERENE-HCTZ 75-50 MG PO TABS: 1.0000 | ORAL_TABLET | Freq: Every day | ORAL | 0 refills | Status: DC

## 2019-04-20 ENCOUNTER — Telehealth: Payer: Self-pay | Admitting: *Deleted

## 2019-04-20 NOTE — Telephone Encounter (Signed)
Called patient to answer questions that she had re; procedure.  Reviewed NPO status, told her to take BP medicines but hold off on everything else until she is finished with procedure.  She is going to bring a driver and where to enter also reviewed.  Patient verbalizes u/o information given.

## 2019-04-24 ENCOUNTER — Encounter: Payer: Self-pay | Admitting: Emergency Medicine

## 2019-04-24 ENCOUNTER — Ambulatory Visit: Payer: Medicare Other

## 2019-04-24 ENCOUNTER — Ambulatory Visit
Admission: EM | Admit: 2019-04-24 | Discharge: 2019-04-24 | Disposition: A | Discharge status: Home / Self Care | Payer: Medicare Other | Attending: Urgent Care | Admitting: Urgent Care

## 2019-04-24 ENCOUNTER — Other Ambulatory Visit: Payer: Self-pay

## 2019-04-24 DIAGNOSIS — S298XXA Other specified injuries of thorax, initial encounter: Secondary | ICD-10-CM

## 2019-04-24 DIAGNOSIS — S300XXA Contusion of lower back and pelvis, initial encounter: Secondary | ICD-10-CM | POA: Diagnosis not present

## 2019-04-24 DIAGNOSIS — G8929 Other chronic pain: Secondary | ICD-10-CM | POA: Diagnosis not present

## 2019-04-24 DIAGNOSIS — S299XXA Unspecified injury of thorax, initial encounter: Secondary | ICD-10-CM | POA: Diagnosis not present

## 2019-04-24 DIAGNOSIS — M545 Low back pain, unspecified: Secondary | ICD-10-CM

## 2019-04-24 DIAGNOSIS — R296 Repeated falls: Secondary | ICD-10-CM | POA: Diagnosis not present

## 2019-04-24 DIAGNOSIS — S20211A Contusion of right front wall of thorax, initial encounter: Secondary | ICD-10-CM | POA: Diagnosis not present

## 2019-04-24 NOTE — ED Triage Notes (Signed)
Patient states that she slipped and fall on her gravel driveway 4 days ago.  Patient states that she landed on her back.  Patient c/o right sided lower back and right sided rib pain. Patient states that her pain is worse with movement.  Patient states that she has been taking Tylenol for pain.

## 2019-04-24 NOTE — ED Provider Notes (Signed)
[place], [place]   Name: Michele Moore DOB: Jul 17, 1950 MRN: [id number] CSN: [id number] PCP: Juline Patch, MD  Arrival date and time:  04/24/19 1247  Chief Complaint:  Back Pain, Fall, and Rib pain   NOTE: Prior to seeing the patient today, I have reviewed the triage nursing documentation and vital signs. Clinical staff has updated patient's PMH/PSHx, current medication list, and drug allergies/intolerances to ensure comprehensive history available to assist in medical decision making.   History:   HPI: Michele Moore is a 69 y.o. female who presents today with complaints of pain in her RIGHT ribs and RIGHT lower back following a mechanical fall that occurred x 4 days ago. Patient reports that she sustained a fall in her driveway onto gravel causing her to land directly on her back. She did not hit her head when she fell; no LOC. Patient reports that since her fall, she has had increasing pain in her ribs and lower back. Patient has been is on COT using Norco 7.5/325 mg TID PRN,  in addition to gabapentin and tizanidine. She notes that her pain is still severe despite the aforementioned interventions. Patient is supplementing with extra doses of APAP. She is under the care of pain management with Dr. Gillis Santa. Patient reporting that she has injection procedure scheduled for tomorrow. Patient ambulating today with the use of a cane. She notes that her pain is exacerbated by bending, torso twisting, and deep inspiration. She reports that the pain causes her to be short of breath at times, however not to the point of being in any acute distress. She states, "it is like I get a strong catch in my side when I take a deep breath".   Past Medical History:  Diagnosis Date  . Allergy   . Anemia   . Arthritis   . Asthma   . Cataract   . Chronic kidney disease    STAGE 3 PER DR EASON 08/02/15  . Collagen vascular disease ([place])   . Family history of adverse reaction to anesthesia    sister difficult  to put to sleep  . GERD (gastroesophageal reflux disease)   . Glaucoma   . H/O tooth extraction    all lower teeth 1/19  . Hemorrhoid   . History of hiatal hernia   . Hypertension   . Hypothyroidism   . Migraines   . Mixed hyperlipidemia   . Multiple gastric ulcers   . Thyroid disease   . Wears dentures    partial upper and lower    Past Surgical History:  Procedure Laterality Date  . ANKLE SURGERY    . CARPAL TUNNEL RELEASE     x3  . COLONOSCOPY  2000?  . COLONOSCOPY WITH PROPOFOL N/A 10/22/2017   Procedure: COLONOSCOPY WITH PROPOFOL;  Surgeon: Lucilla Lame, MD;  Location: [facility];  Service: Endoscopy;  Laterality: N/A;  . ESOPHAGOGASTRODUODENOSCOPY (EGD) WITH PROPOFOL N/A 10/22/2017   Procedure: ESOPHAGOGASTRODUODENOSCOPY (EGD) WITH PROPOFOL;  Surgeon: Lucilla Lame, MD;  Location: [facility];  Service: Endoscopy;  Laterality: N/A;  . FUSION OF TALONAVICULAR JOINT Right 08/03/2015   Procedure: TAILOR NAVICULAR JOINT FUSION - RIGHT ;  Surgeon: [name], DPM;  Location: [facility];  Service: Podiatry;  Laterality: Right;  . JOINT REPLACEMENT     knee x 3 ,right x1 and left x2  . POLYPECTOMY N/A 10/22/2017   Procedure: POLYPECTOMY;  Surgeon: Lucilla Lame, MD;  Location: [facility];  Service: Endoscopy;  Laterality:  N/A;  . ROTATOR CUFF REPAIR    . TONSILLECTOMY    . TOTAL KNEE REVISION Right 01/20/2018   Procedure: POLYETHYLENE EXCHANGE;  Surgeon: Dereck Leep, MD;  Location: [facility];  Service: Orthopedics;  Laterality: Right;  . TUBAL LIGATION    . UPPER GI ENDOSCOPY  2000?    Family History  Problem Relation Age of Onset  . Heart failure Mother   . Gout Mother   . Arthritis Mother   . Hypertension Mother   . Stroke Mother   . Heart failure Father   . Diabetes Father   . Hyperlipidemia Father   . COPD Sister   . Cancer Maternal Aunt        breast  . Cancer Maternal Uncle        kidney  . Breast cancer Neg Hx     Social History    Tobacco Use  . Smoking status: Former Smoker    Quit date: 02/24/1993    Years since quitting: 26.1  . Smokeless tobacco: Never Used  Substance Use Topics  . Alcohol use: No    Alcohol/week: 0.0 standard drinks  . Drug use: No    Patient Active Problem List   Diagnosis Date Noted  . Non-seasonal allergic rhinitis 04/12/2019  . Benign essential hypertension 03/06/2019  . Proteinuria 03/06/2019  . Trochanteric bursitis of left hip 10/04/2018  . SI joint arthritis (left) 10/04/2018  . Exertional chest pain 09/30/2018  . CKD (chronic kidney disease), stage III 09/30/2018  . Psoriatic arthritis ([place]) 04/23/2018  . Trigger ring finger of left hand 04/14/2018  . Female pelvic pain 01/20/2018  . History of revision of total knee arthroplasty 01/20/2018  . Iron deficiency anemia   . Heme + stool   . Acute gastritis without hemorrhage   . Gastric polyps   . Benign neoplasm of ascending colon   . Benign neoplasm of transverse colon   . Polyp of sigmoid colon   . Hypertension 10/01/2017  . Hypothyroidism 10/01/2017  . Gastroesophageal reflux disease 10/01/2017  . Hypokalemia 10/01/2017  . Mixed hyperlipidemia 10/01/2017  . Reactive airway disease 10/01/2017  . Bradycardia 08/24/2017  . Severe obesity (BMI 35.0-35.9 with comorbidity) ([place]) 08/18/2017  . Chronic gouty arthropathy without tophi 06/25/2017  . Encounter for long-term (current) use of high-risk medication 06/25/2017  . Positive ANA (antinuclear antibody) 06/17/2017  . Cervical spondylosis without myelopathy 06/16/2017  . Osteoarthritis of left shoulder due to rotator cuff injury 06/16/2017  . Sprain of anterior talofibular ligament of right ankle 06/16/2017  . Lumbar radiculopathy 04/21/2017  . Lumbar degenerative disc disease 04/21/2017  . Chronic pain syndrome 04/21/2017  . Spinal stenosis, lumbar region, with neurogenic claudication 04/21/2017  . SS-A antibody positive 03/05/2017  . Pain, lower extremity  08/03/2015  . DOE (dyspnea on exertion) 12/28/2013    Home Medications:    Current Meds  Medication Sig  . allopurinol (ZYLOPRIM) 100 MG tablet Take 200 mg by mouth daily.   . ASPIRIN 81 PO Take 81 mg by mouth.  . ferrous sulfate 325 (65 FE) MG tablet Take 325 mg by mouth daily with breakfast.   . gabapentin (NEURONTIN) 600 MG tablet Take 1 tablet (600 mg total) by mouth at bedtime.  . hydrALAZINE (APRESOLINE) 50 MG tablet Take 1 tablet (50 mg total) by mouth 2 (two) times daily.  Marland Kitchen HYDROcodone-acetaminophen (NORCO) 7.5-325 MG tablet Take 1 tablet by mouth every 8 (eight) hours as needed for severe pain. Must last 30 days.  Marland Kitchen  levothyroxine (SYNTHROID) 200 MCG tablet TAKE 1 TABLET(200 MCG) BY MOUTH DAILY BEFORE BREAKFAST  . losartan (COZAAR) 25 MG tablet Take 1 tablet (25 mg total) by mouth daily.  . methotrexate (RHEUMATREX) 2.5 MG tablet Take 20 mg by mouth every Friday. Take 5 tablets by mouth once weekly  . Multiple Vitamins-Calcium (ONE-A-DAY WOMENS FORMULA PO) Take 1 tablet by mouth daily.  Marland Kitchen omega-3 acid ethyl esters (LOVAZA) 1 g capsule Take 1 capsule (1 g total) by mouth 2 (two) times daily.  Marland Kitchen omeprazole (PRILOSEC) 40 MG capsule Take 1 capsule (40 mg total) by mouth daily.  . potassium chloride SA (KLOR-CON) 20 MEQ tablet Take 0.5 tablets (10 mEq total) by mouth daily.  Marland Kitchen tiZANidine (ZANAFLEX) 4 MG tablet Take 1 tablet (4 mg total) by mouth 2 (two) times daily as needed for muscle spasms.  Marland Kitchen triamterene-hydrochlorothiazide (MAXZIDE) 75-50 MG tablet Take 1 tablet by mouth daily.    Allergies:   Ampicillin, Penicillins, Vancomycin, and Vibramycin [doxycycline calcium]  Review of Systems (ROS):  Review of systems NEGATIVE unless otherwise noted in narrative H&P section.   Vital Signs: Today's Vitals   04/24/19 1256 04/24/19 1257 04/24/19 1302 04/24/19 1421  BP:   (!) 143/59   Pulse:   71   Resp:   16   Temp:   98.6 F (37 C)   TempSrc:   Oral   SpO2:   100%   Weight:   270 lb (122.5 kg)    Height:  5\' 11"  (1.803 m)    PainSc: 10-Worst pain ever   10-Worst pain ever    Physical Exam: Physical Exam  Constitutional: She is oriented to person, place, and time and well-developed, well-nourished, and in no distress.  HENT:  Head: Normocephalic and atraumatic.  Eyes: Pupils are equal, round, and reactive to light.  Cardiovascular: Normal rate, regular rhythm, normal heart sounds and intact distal pulses.  Pulmonary/Chest: Effort normal and breath sounds normal. She exhibits tenderness (RIGHT lateral and beneath RIGHT breast). She exhibits no crepitus, no edema and no deformity.    No SOB or increased WOB. No distress. Able to speak in complete sentences without difficulties. SPO2 100% on Moore.   Musculoskeletal:     Cervical back: Normal range of motion and neck supple.     Lumbar back: Pain and tenderness present. No deformity or spasms.       Back:     Comments: No midline neck/back pain or gross deformities.  Neurological: She is alert and oriented to person, place, and time. She has normal sensation, normal strength and normal reflexes.  Unable to perform SLR due to acute pain.   Skin: Skin is warm and dry. No rash noted. She is not diaphoretic.  Psychiatric: Mood, memory, affect and judgment normal.  Nursing note and vitals reviewed.   Urgent Care Treatments / Results:   Orders Placed This Encounter  Procedures  . DG Ribs Unilateral W/Chest Right  . DG Lumbar Spine Complete    LABS: PLEASE NOTE: all labs that were ordered this encounter are listed, however only abnormal results are displayed. Labs Reviewed - No data to display  EKG: -None  RADIOLOGY: DG Ribs Unilateral W/Chest Right  Result Date: 04/24/2019 CLINICAL DATA:  Acute RIGHT chest and rib pain following fall. Initial encounter. EXAM: RIGHT RIBS AND CHEST - 3+ VIEW COMPARISON:  02/28/2019 and prior radiographs FINDINGS: No fracture or other bone lesions are seen involving the  ribs. There is no evidence of pneumothorax or  pleural effusion. Both lungs are clear. Heart size and mediastinal contours are within normal limits. IMPRESSION: Negative.  No definite RIGHT rib fracture identified. Electronically Signed   By: Margarette Canada M.D.   On: 04/24/2019 14:10   DG Lumbar Spine Complete  Result Date: 04/24/2019 CLINICAL DATA:  Acute low back pain following fall 4 days ago. Initial encounter. EXAM: LUMBAR SPINE - COMPLETE 4+ VIEW COMPARISON:  11/16/2015 FINDINGS: No acute fracture or subluxation noted. Multilevel degenerative disc disease, spondylosis and facet arthropathy, moderate to severe at L4-5 and L5-S1. A LEFT L5 pars defect may be present. No suspicious focal bony abnormalities are noted. IMPRESSION: 1. No evidence of acute abnormality. 2. Multilevel degenerative changes, moderate to severe at L4-5 and L5-S1. 3. Equivocal LEFT L5 pars defect. Electronically Signed   By: Margarette Canada M.D.   On: 04/24/2019 14:13    PROCEDURES: Procedures  MEDICATIONS RECEIVED THIS VISIT: Medications - No data to display  PERTINENT CLINICAL COURSE NOTES/UPDATES:   Initial Impression / Assessment and Plan / Urgent Care Course:  Pertinent labs & imaging results that were available during my care of the patient were personally reviewed by me and considered in my medical decision making (see lab/imaging section of note for values and interpretations).  Michele Moore is a 69 y.o. female who presents to [facility] today with complaints of Back Pain, Fall, and Rib pain  Patient is well appearing overall in clinic today. She does not appear to be in any acute distress. Presenting symptoms (see HPI) and exam as documented above. Pain x 4 days following a mechanical fall. Will obtain radiographs today to further assess patient's complaints.    Diagnostic radiographs of the chest with dedicated rib series revealed no acute abnormalities; no fracture, dislocation, or effusion. There was no  evidence of PTX noted on the plain films.    Diagnostic radiographs of the lumbar spine revealed multi-level moderate to severe DDD, spondylosis, and facet arthropathy. There were no acute fractures or other acute osseous abnormalities noted on today's radiographs.   Findings reviewed in detail with patient. Based on the location of her pain, suspect rib and lower back contusions. No distress or SOB noted in clinic. Patient has chronic pain and is followed by pain management, whom she sees next tomorrow. Again, patient is on COT and muscle relaxer therapy. In the absence of acute injury, will defer changing patient's pain medication today, especially seeing whereas she is seeing her pain provider tomorrow. Reviewed complimentary modalities to help with her pain. Patient encouraged to rest and avoid twisting/bending/lifting. She was advised that she would likely find added benefit of applying alternating moist heat and ice TID-QID for at least 15-20 minutes at a time; written information provided on today's AVS. Patient educated to [place] and pulmonary hygiene exercises to prevent the development of a pneumonia related to post-traumatic hypoventilation and splinting. Patient to follow up with pain specialist on 04/25/2019 as already scheduled. Advised to discussed painful areas with Dr. Holley Raring at that time to see if there were any interventional procedures/injections that could be offered for her acute pain exacerbation following her fall.   I have reviewed the follow up and strict return precautions for any new or worsening symptoms. Patient is aware of symptoms that would be deemed urgent/emergent, and would thus require further evaluation either here or in the emergency department. At the time of discharge, she verbalized understanding and consent with the discharge plan as it was reviewed with her. All  questions were fielded by provider and/or clinic staff prior to patient discharge.    Final Clinical  Impressions / Urgent Care Diagnoses:   Final diagnoses:  Fall in elderly patient  Contusion of rib on right side, initial encounter  Contusion of lower back, initial encounter  Chronic low back pain without sciatica, unspecified back pain laterality    New Prescriptions:  [place] Controlled Substance Registry consulted? Yes, I have consulted the [place] Controlled Substances Registry for this patient, and feel the risk/benefit ratio today is not favorable for proceeding with this prescription for a controlled substance. Patient on CPT as prescribed by pain management. Patient will be seeing provider tomorrow (04/25/2019), thus I will defer any further prescriptions as I am fairly certain she is under a pain contract based on the terminology "must last 30 days" on her current opioid Rx's on file.   No orders of the defined types were placed in this encounter.   Recommended Follow up Care:  Patient encouraged to follow up with the following provider within the specified time frame, or sooner as dictated by the severity of her symptoms. As always, she was instructed that for any urgent/emergent care needs, she should seek care either here or in the emergency department for more immediate evaluation.  Follow-up Information    Juline Patch, MD In 1 week.   Specialty: Family Medicine Why: General reassessment of symptoms if not improving Contact information: [address] [place] [address] [phone]         NOTE: This note was prepared using Dragon dictation software along with smaller phrase technology. Despite my best ability to proofread, there is the potential that transcriptional errors may still occur from this process, and are completely unintentional.    Karen Kitchens, NP 04/24/19 [phone]

## 2019-04-24 NOTE — Discharge Instructions (Addendum)
It was very nice seeing you today in clinic. Thank you for entrusting me with your care.   Your xrays did not show any acute fractures of your ribs or back today. Continue the pain medication that you have at home. Apply heat to help as well.   Make arrangements to follow up with your regular doctor in 1 week for re-evaluation if not improving. If your symptoms/condition worsens, please seek follow up care either here or in the ER. Please remember, our [facility] providers are "right here with you" when you need [place].   Again, it was my pleasure to take care of you today. Thank you for choosing our clinic. I hope that you start to feel better quickly.   Honor Loh, MSN, APRN, FNP-C, CEN Advanced Practice Provider [facility]

## 2019-04-25 ENCOUNTER — Ambulatory Visit (HOSPITAL_BASED_OUTPATIENT_CLINIC_OR_DEPARTMENT_OTHER): Payer: Medicare Other | Admitting: Student in an Organized Health Care Education/Training Program

## 2019-04-25 ENCOUNTER — Encounter: Payer: Self-pay | Admitting: Student in an Organized Health Care Education/Training Program

## 2019-04-25 ENCOUNTER — Other Ambulatory Visit: Payer: Self-pay

## 2019-04-25 ENCOUNTER — Ambulatory Visit
Admission: RE | Admit: 2019-04-25 | Discharge: 2019-04-25 | Disposition: A | Discharge status: Home / Self Care | Payer: Medicare Other | Source: Ambulatory Visit | Attending: Student in an Organized Health Care Education/Training Program | Admitting: Student in an Organized Health Care Education/Training Program

## 2019-04-25 VITALS — BP 150/83 | HR 60 | Temp 97.4°F | Resp 20 | Ht 71.0 in | Wt 270.0 lb

## 2019-04-25 DIAGNOSIS — M47812 Spondylosis without myelopathy or radiculopathy, cervical region: Secondary | ICD-10-CM | POA: Diagnosis not present

## 2019-04-25 DIAGNOSIS — M19012 Primary osteoarthritis, left shoulder: Secondary | ICD-10-CM | POA: Insufficient documentation

## 2019-04-25 MED ORDER — LIDOCAINE HCL 2 % IJ SOLN
20.0000 mL | Freq: Once | INTRAMUSCULAR | Status: AC
  Administered 2019-04-25: 400 mg

## 2019-04-25 MED ORDER — ROPIVACAINE HCL 2 MG/ML IJ SOLN
INTRAMUSCULAR | Status: AC
  Filled 2019-04-25: qty 10

## 2019-04-25 MED ORDER — IOHEXOL 180 MG/ML  SOLN
10.0000 mL | Freq: Once | INTRAMUSCULAR | Status: DC
  Filled 2019-04-25: qty 20

## 2019-04-25 MED ORDER — METHYLPREDNISOLONE ACETATE 40 MG/ML IJ SUSP
40.0000 mg | Freq: Once | INTRAMUSCULAR | Status: AC
  Administered 2019-04-25: 40 mg via INTRA_ARTICULAR

## 2019-04-25 MED ORDER — DEXAMETHASONE SODIUM PHOSPHATE 10 MG/ML IJ SOLN
10.0000 mg | Freq: Once | INTRAMUSCULAR | Status: AC
  Administered 2019-04-25: 10 mg

## 2019-04-25 MED ORDER — ROPIVACAINE HCL 2 MG/ML IJ SOLN
9.0000 mL | Freq: Once | INTRAMUSCULAR | Status: AC
  Administered 2019-04-25: 9 mL via PERINEURAL
  Filled 2019-04-25: qty 10

## 2019-04-25 MED ORDER — ROPIVACAINE HCL 2 MG/ML IJ SOLN
4.0000 mL | Freq: Once | INTRAMUSCULAR | Status: AC
  Administered 2019-04-25: 10 mL via INTRA_ARTICULAR

## 2019-04-25 MED ORDER — FENTANYL CITRATE (PF) 100 MCG/2ML IJ SOLN
INTRAMUSCULAR | Status: AC
  Filled 2019-04-25: qty 2

## 2019-04-25 MED ORDER — METHYLPREDNISOLONE ACETATE 40 MG/ML IJ SUSP
INTRAMUSCULAR | Status: AC
  Filled 2019-04-25: qty 1

## 2019-04-25 MED ORDER — FENTANYL CITRATE (PF) 100 MCG/2ML IJ SOLN
25.0000 ug | INTRAMUSCULAR | Status: DC | PRN
  Administered 2019-04-25: 75 ug via INTRAVENOUS

## 2019-04-25 MED ORDER — DEXAMETHASONE SODIUM PHOSPHATE 10 MG/ML IJ SOLN
INTRAMUSCULAR | Status: AC
  Filled 2019-04-25: qty 1

## 2019-04-25 MED ORDER — LIDOCAINE HCL 2 % IJ SOLN
INTRAMUSCULAR | Status: AC
  Filled 2019-04-25: qty 20

## 2019-04-25 NOTE — Progress Notes (Signed)
PROVIDER NOTE: Information contained herein reflects review and annotations entered in association with encounter. Interpretation of such information and data should be left to medically-trained personnel. Information provided to patient can be located elsewhere in the medical record under "Patient Instructions". Document created using STT-dictation technology, any transcriptional errors that may result from process are unintentional.    Patient: Michele Moore  Service Category: Procedure  Provider: Gillis Santa, MD  DOB: 1950-12-11  DOS: 04/25/2019  Location: [facility]  MRN: [id number]  Setting: Ambulatory - outpatient  Referring Provider: Juline Patch, MD  Type: Established Patient  Specialty: Interventional Pain Management  PCP: Juline Patch, MD   Primary Reason for Visit: Interventional Pain Management Treatment. CC: Neck pain and shoulder pain Pain (Left shoulder)  Procedure:          Anesthesia, Analgesia, Anxiolysis:  Type: Cervical Facet Medial Branch Block(s)  #2  Primary Purpose: Diagnostic Region: Posterolateral cervical spine Level:  C4, C5, C6, & C7 Medial Branch Level(s). Injecting these levels blocks the C3-4, C4-5, C5-6, and C6-7 cervical facet joints. Laterality: Left  Type: Moderate (Conscious) Sedation combined with Local Anesthesia Indication(s): Analgesia and Anxiety Route: Intravenous (IV) IV Access: Secured Sedation: Meaningful verbal contact was maintained at all times during the procedure  Local Anesthetic: Lidocaine 1-2%  Position: Prone with head of the table raised to facilitate breathing.   Indications: 1. Cervical spondylosis without myelopathy   2. Cervical facet syndrome   3. Primary osteoarthritis of left shoulder    Pain Score: Pre-procedure: 8 /10 Post-procedure: 0-No pain/10   Pre-op Assessment:  Michele Moore is a 69 y.o. (year old), female patient, seen today for interventional treatment. She  has a past surgical history that  includes Carpal tunnel release; Rotator cuff repair; Ankle surgery; Tubal ligation; Colonoscopy (2000?); Upper gi endoscopy (2000?); Tonsillectomy; Fusion of talonavicular joint (Right, 08/03/2015); Colonoscopy with propofol (N/A, 10/22/2017); Esophagogastroduodenoscopy (egd) with propofol (N/A, 10/22/2017); polypectomy (N/A, 10/22/2017); Joint replacement; and Total knee revision (Right, 01/20/2018). Michele Moore has a current medication list which includes the following prescription(s): albuterol, allopurinol, aspirin, vitamin d3, diclofenac sodium, ferrous sulfate, gabapentin, hydralazine, hydrocodone-acetaminophen, [START ON 05/01/2019] hydrocodone-acetaminophen, ketoconazole, levothyroxine, losartan, meclizine, methotrexate, multiple vitamins-calcium, omega-3 acid ethyl esters, omeprazole, polyethyl glycol-propyl glycol, potassium chloride sa, tizanidine, triamcinolone cream, triamterene-hydrochlorothiazide, [DISCONTINUED] fluticasone, and [DISCONTINUED] mometasone, and the following Facility-Administered Medications: fentanyl and iohexol. Her primarily concern today is the Pain (Left shoulder)  Initial Vital Signs:  Pulse/HCG Rate: 60ECG Heart Rate: 72 Temp: 97.7 F (36.5 C) Resp: 18 BP: (!) 151/96 SpO2: 99 %  BMI: Estimated body mass index is 37.66 kg/m as calculated from the following:   Height as of this encounter: 5\' 11"  (1.803 m).   Weight as of this encounter: 270 lb (122.5 kg).  Risk Assessment: Allergies: Reviewed. She is allergic to ampicillin; penicillins; vancomycin; and vibramycin [doxycycline calcium].  Allergy Precautions: None required Coagulopathies: Reviewed. None identified.  Blood-thinner therapy: None at this time Active Infection(s): Reviewed. None identified. Michele Moore is afebrile  Site Confirmation: Michele Moore was asked to confirm the procedure and laterality before marking the site Procedure checklist: Completed Consent: Before the procedure and under the influence of no  sedative(s), amnesic(s), or anxiolytics, the patient was informed of the treatment options, risks and possible complications. To fulfill our ethical and legal obligations, as recommended by the American Medical Association's Code of Ethics, I have informed the patient of my clinical impression; the nature and purpose of the treatment or procedure;  the risks, benefits, and possible complications of the intervention; the alternatives, including doing nothing; the risk(s) and benefit(s) of the alternative treatment(s) or procedure(s); and the risk(s) and benefit(s) of doing nothing. The patient was provided information about the general risks and possible complications associated with the procedure. These may include, but are not limited to: failure to achieve desired goals, infection, bleeding, organ or nerve damage, allergic reactions, paralysis, and death. In addition, the patient was informed of those risks and complications associated to Spine-related procedures, such as failure to decrease pain; infection (i.e.: Meningitis, epidural or intraspinal abscess); bleeding (i.e.: epidural hematoma, subarachnoid hemorrhage, or any other type of intraspinal or peri-dural bleeding); organ or nerve damage (i.e.: Any type of peripheral nerve, nerve root, or spinal cord injury) with subsequent damage to sensory, motor, and/or autonomic systems, resulting in permanent pain, numbness, and/or weakness of one or several areas of the body; allergic reactions; (i.e.: anaphylactic reaction); and/or death. Furthermore, the patient was informed of those risks and complications associated with the medications. These include, but are not limited to: allergic reactions (i.e.: anaphylactic or anaphylactoid reaction(s)); adrenal axis suppression; blood sugar elevation that in diabetics may result in ketoacidosis or comma; water retention that in patients with history of congestive heart failure may result in shortness of breath,  pulmonary edema, and decompensation with resultant heart failure; weight gain; swelling or edema; medication-induced neural toxicity; particulate matter embolism and blood vessel occlusion with resultant organ, and/or nervous system infarction; and/or aseptic necrosis of one or more joints. Finally, the patient was informed that Medicine is not an exact science; therefore, there is also the possibility of unforeseen or unpredictable risks and/or possible complications that may result in a catastrophic outcome. The patient indicated having understood very clearly. We have given the patient no guarantees and we have made no promises. Enough time was given to the patient to ask questions, all of which were answered to the patient's satisfaction. Michele Moore has indicated that she wanted to continue with the procedure. Attestation: I, the ordering provider, attest that I have discussed with the patient the benefits, risks, side-effects, alternatives, likelihood of achieving goals, and potential problems during recovery for the procedure that I have provided informed consent. Date  Time: 04/25/2019 10:50 AM  Pre-Procedure Preparation:  Monitoring: As per clinic protocol. Respiration, ETCO2, SpO2, BP, heart rate and rhythm monitor placed and checked for adequate function Safety Precautions: Patient was assessed for positional comfort and pressure points before starting the procedure. Time-out: I initiated and conducted the "Time-out" before starting the procedure, as per protocol. The patient was asked to participate by confirming the accuracy of the "Time Out" information. Verification of the correct person, site, and procedure were performed and confirmed by me, the nursing staff, and the patient. "Time-out" conducted as per Joint Commission's Universal Protocol (UP.01.01.01). Time: 1144  Description of Procedure:          Laterality: Left Level: C4, C5, C6, & C7 Medial Branch Level(s). Area Prepped: Posterior  Cervico-thoracic Region Prepping solution: DuraPrep (Iodine Povacrylex [0.7% available iodine] and Isopropyl Alcohol, 74% w/w) Safety Precautions: Aspiration looking for blood return was conducted prior to all injections. At no point did we inject any substances, as a needle was being advanced. Before injecting, the patient was told to immediately notify me if she was experiencing any new onset of "ringing in the ears, or metallic taste in the mouth". No attempts were made at seeking any paresthesias. Safe injection practices and needle disposal techniques used.  Medications properly checked for expiration dates. SDV (single dose vial) medications used. After the completion of the procedure, all disposable equipment used was discarded in the proper designated medical waste containers. Local Anesthesia: Protocol guidelines were followed. The patient was positioned over the fluoroscopy table. The area was prepped in the usual manner. The time-out was completed. The target area was identified using fluoroscopy. A 12-in long, straight, sterile hemostat was used with fluoroscopic guidance to locate the targets for each level blocked. Once located, the skin was marked with an approved surgical skin marker. Once all sites were marked, the skin (epidermis, dermis, and hypodermis), as well as deeper tissues (fat, connective tissue and muscle) were infiltrated with a small amount of a short-acting local anesthetic, loaded on a 10cc syringe with a 25G, 1.5-in  Needle. An appropriate amount of time was allowed for local anesthetics to take effect before proceeding to the next step. Local Anesthetic: Lidocaine 2.0% The unused portion of the local anesthetic was discarded in the proper designated containers. Technical explanation of process:  C4 Medial Branch Nerve Block (MBB): The target area for the C4 dorsal medial articular branch is the lateral concave waist of the articular pillar of C4. Under fluoroscopic guidance, a  Quincke needle was inserted until contact was made with os over the postero-lateral aspect of the articular pillar of C4 (target area). After negative aspiration for blood, 2 mL of the nerve block solution was injected without difficulty or complication. The needle was removed intact. C5 Medial Branch Nerve Block (MBB): The target area for the C5 dorsal medial articular branch is the lateral concave waist of the articular pillar of C5. Under fluoroscopic guidance, a Quincke needle was inserted until contact was made with os over the postero-lateral aspect of the articular pillar of C5 (target area). After negative aspiration for blood, 2 mL of the nerve block solution was injected without difficulty or complication. The needle was removed intact. C6 Medial Branch Nerve Block (MBB): The target area for the C6 dorsal medial articular branch is the lateral concave waist of the articular pillar of C6. Under fluoroscopic guidance, a Quincke needle was inserted until contact was made with os over the postero-lateral aspect of the articular pillar of C6 (target area). After negative aspiration for blood, 2 mL of the nerve block solution was injected without difficulty or complication. The needle was removed intact. C7 Medial Branch Nerve Block (MBB): The target for the C7 dorsal medial articular branch lies on the superior-medial tip of the C7 transverse process. Under fluoroscopic guidance, a Quincke needle was inserted until contact was made with os over the postero-lateral aspect of the articular pillar of C7 (target area). After negative aspiration for blood, 2 mL of the nerve block solution was injected without difficulty or complication. The needle was removed intact. Procedural Needles: 22-gauge, 3.5-inch, Quincke needles used for all levels. Nerve block solution: 8 cc solution made of 7 cc of 0.2% prilocaine, 1 cc of Decadron 10 mg/cc.  2 cc injected at each level above on the left.The unused portion of the  solution was discarded in the proper designated containers.  Once the entire procedure was completed, the treated area was cleaned, making sure to leave some of the prepping solution back to take advantage of its long term bactericidal properties.  Anatomy Reference Guide:       Vitals:   04/25/19 1202 04/25/19 1212 04/25/19 1222 04/25/19 1233  BP: (!) 147/100 (!) 151/87 (!) 148/87 (!) 150/83  Pulse:  Resp: 16 20 20 20   Temp:  (!) 97.3 F (36.3 C)  (!) 97.4 F (36.3 C)  TempSrc:      SpO2: 96% 100% 100% 100%  Weight:      Height:        Start Time: 1144 hrs. End Time: 1203 hrs.  Imaging Guidance (Spinal):          Type of Imaging Technique: Fluoroscopy Guidance (Spinal) Indication(s): Assistance in needle guidance and placement for procedures requiring needle placement in or near specific anatomical locations not easily accessible without such assistance. Exposure Time: Please see nurses notes. Contrast: None used. Fluoroscopic Guidance: I was personally present during the use of fluoroscopy. "Tunnel Vision Technique" used to obtain the best possible view of the target area. Parallax error corrected before commencing the procedure. "Direction-depth-direction" technique used to introduce the needle under continuous pulsed fluoroscopy. Once target was reached, antero-posterior, oblique, and lateral fluoroscopic projection used confirm needle placement in all planes. Images permanently stored in EMR. Interpretation: No contrast injected. I personally interpreted the imaging intraoperatively. Adequate needle placement confirmed in multiple planes. Permanent images saved into the patient's record.  Antibiotic Prophylaxis:   Anti-infectives (From admission, onward)   None     Indication(s): None identified  Post-operative Assessment:  Post-procedure Vital Signs:  Pulse/HCG Rate: 6070 Temp: (!) 97.4 F (36.3 C) Resp: 20 BP: (!) 150/83 SpO2: 100 %  EBL:  None  Complications: No immediate post-treatment complications observed by team, or reported by patient.  Note: The patient tolerated the entire procedure well. A repeat set of vitals were taken after the procedure and the patient was kept under observation following institutional policy, for this type of procedure. Post-procedural neurological assessment was performed, showing return to baseline, prior to discharge. The patient was provided with post-procedure discharge instructions, including a section on how to identify potential problems. Should any problems arise concerning this procedure, the patient was given instructions to immediately contact us, at any time, without hesitation. In any case, we plan to contact the patient by telephone for a follow-up status report regarding this interventional procedure.  Comments:  No additional relevant information.  Plan of Care  Orders:  Orders Placed This Encounter  Procedures  . [facility] C-ARM 1-60 MIN NO REPORT    Intraoperative interpretation by procedural physician at [facility].    Standing Status:   Standing    Number of Occurrences:   1    Order Specific Question:   Reason for exam:    Answer:   Assistance in needle guidance and placement for procedures requiring needle placement in or near specific anatomical locations not easily accessible without such assistance.   Medications ordered for procedure: Meds ordered this encounter  Medications  . iohexol (OMNIPAQUE) 180 MG/ML injection 10 mL    Must be Myelogram-compatible. If not available, you may substitute with a water-soluble, non-ionic, hypoallergenic, myelogram-compatible radiological contrast medium.  Marland Kitchen lidocaine (XYLOCAINE) 2 % (with pres) injection 400 mg  . fentaNYL (SUBLIMAZE) injection 25-50 mcg    Make sure Narcan is available in the pyxis when using this medication. In the event of respiratory depression (RR< 8/min): Titrate NARCAN (naloxone) in  increments of 0.1 to 0.2 mg IV at 2-3 minute intervals, until desired degree of reversal.  . dexamethasone (DECADRON) injection 10 mg  . ropivacaine (PF) 2 mg/mL (0.2%) (NAROPIN) injection 9 mL  . ropivacaine (PF) 2 mg/mL (0.2%) (NAROPIN) injection 4 mL  . methylPREDNISolone acetate (DEPO-MEDROL) injection 40  mg   Medications administered: We administered lidocaine, fentaNYL, dexamethasone, ropivacaine (PF) 2 mg/mL (0.2%), ropivacaine (PF) 2 mg/mL (0.2%), and methylPREDNISolone acetate.  See the medical record for exact dosing, route, and time of administration.  Follow-up plan:   Return for Keep sch. appt.      Status post left C4-C7 cervical facet blocks, left glenohumeral posterior shoulder joint injection on 04/25/2019   Recent Visits Date Type Provider Dept  02/15/19 Office Visit Gillis Santa, MD [facility]  Showing recent visits within past 90 days and meeting all other requirements   Today's Visits Date Type Provider Dept  04/25/19 Procedure visit Gillis Santa, MD [facility]  Showing today's visits and meeting all other requirements   Future Appointments Date Type Provider Dept  05/31/19 Appointment Gillis Santa, MD [facility]  Showing future appointments within next 90 days and meeting all other requirements   Disposition: Discharge home  Discharge (Date  Time): 04/25/2019; 1234 hrs.   Primary Care Physician: Juline Patch, MD Location: [facility] Note by: Gillis Santa, MD Date: 04/25/2019; Time: 12:44 PM  Disclaimer:  Medicine is not an exact science. The only guarantee in medicine is that nothing is guaranteed. It is important to note that the decision to proceed with this intervention was based on the information collected from the patient. The Data and conclusions were drawn from the patient's questionnaire, the interview, and the physical examination. Because the information was provided in large part by  the patient, it cannot be guaranteed that it has not been purposely or unconsciously manipulated. Every effort has been made to obtain as much relevant data as possible for this evaluation. It is important to note that the conclusions that lead to this procedure are derived in large part from the available data. Always take into account that the treatment will also be dependent on availability of resources and existing treatment guidelines, considered by other Pain Management Practitioners as being common knowledge and practice, at the time of the intervention. For Medico-Legal purposes, it is also important to point out that variation in procedural techniques and pharmacological choices are the acceptable norm. The indications, contraindications, technique, and results of the above procedure should only be interpreted and judged by a Board-Certified Interventional Pain Specialist with extensive familiarity and expertise in the same exact procedure and technique.

## 2019-04-25 NOTE — Progress Notes (Signed)
PROVIDER NOTE: Information contained herein reflects review and annotations entered in association with encounter. Interpretation of such information and data should be left to medically-trained personnel. Information provided to patient can be located elsewhere in the medical record under "Patient Instructions". Document created using STT-dictation technology, any transcriptional errors that may result from process are unintentional.    Patient: Michele Moore  Service Category: Procedure  Provider: Gillis Santa, MD  DOB: 1950/12/15  DOS: 04/25/2019  Location: [facility]  MRN: [id number]  Setting: Ambulatory - outpatient  Referring Provider: Juline Patch, MD  Type: Established Patient  Specialty: Interventional Pain Management  PCP: Juline Patch, MD   Primary Reason for Visit: Interventional Pain Management Treatment. CC: Pain (Left shoulder)  Procedure:          Anesthesia, Analgesia, Anxiolysis:  Type: Diagnostic Glenohumeral Joint (shoulder) Injection #1  Primary Purpose: Diagnostic Region: Posterior Shoulder Area Level:  Shoulder Target Area: Glenohumeral Joint (shoulder) Approach: Posterior approach. Laterality: Left-Sided  Type: Moderate (Conscious) Sedation combined with Local Anesthesia Indication(s): Analgesia and Anxiety Route: Intravenous (IV) IV Access: Secured Sedation: Meaningful verbal contact was maintained at all times during the procedure  Local Anesthetic: Lidocaine 1-2%  Position: Supine   Indications: Left shoulder osteoarthritis  Pain Score: Pre-procedure: 8 /10 Post-procedure: 0-No pain/10   Pre-op Assessment:  Michele Moore is a 69 y.o. (year old), female patient, seen today for interventional treatment. She  has a past surgical history that includes Carpal tunnel release; Rotator cuff repair; Ankle surgery; Tubal ligation; Colonoscopy (2000?); Upper gi endoscopy (2000?); Tonsillectomy; Fusion of talonavicular joint (Right, 08/03/2015);  Colonoscopy with propofol (N/A, 10/22/2017); Esophagogastroduodenoscopy (egd) with propofol (N/A, 10/22/2017); polypectomy (N/A, 10/22/2017); Joint replacement; and Total knee revision (Right, 01/20/2018). Michele Moore has a current medication list which includes the following prescription(s): albuterol, allopurinol, aspirin, vitamin d3, diclofenac sodium, ferrous sulfate, gabapentin, hydralazine, hydrocodone-acetaminophen, [START ON 05/01/2019] hydrocodone-acetaminophen, ketoconazole, levothyroxine, losartan, meclizine, methotrexate, multiple vitamins-calcium, omega-3 acid ethyl esters, omeprazole, polyethyl glycol-propyl glycol, potassium chloride sa, tizanidine, triamcinolone cream, triamterene-hydrochlorothiazide, [DISCONTINUED] fluticasone, and [DISCONTINUED] mometasone, and the following Facility-Administered Medications: fentanyl and iohexol. Her primarily concern today is the Pain (Left shoulder)  Initial Vital Signs:  Pulse/HCG Rate: 60ECG Heart Rate: 72 Temp: 97.7 F (36.5 C) Resp: 18 BP: (!) 151/96 SpO2: 99 %  BMI: Estimated body mass index is 37.66 kg/m as calculated from the following:   Height as of this encounter: 5\' 11"  (1.803 m).   Weight as of this encounter: 270 lb (122.5 kg).  Risk Assessment: Allergies: Reviewed. She is allergic to ampicillin; penicillins; vancomycin; and vibramycin [doxycycline calcium].  Allergy Precautions: None required Coagulopathies: Reviewed. None identified.  Blood-thinner therapy: None at this time Active Infection(s): Reviewed. None identified. Michele Moore is afebrile  Site Confirmation: Michele Moore was asked to confirm the procedure and laterality before marking the site Procedure checklist: Completed Consent: Before the procedure and under the influence of no sedative(s), amnesic(s), or anxiolytics, the patient was informed of the treatment options, risks and possible complications. To fulfill our ethical and legal obligations, as recommended by the American  Medical Association's Code of Ethics, I have informed the patient of my clinical impression; the nature and purpose of the treatment or procedure; the risks, benefits, and possible complications of the intervention; the alternatives, including doing nothing; the risk(s) and benefit(s) of the alternative treatment(s) or procedure(s); and the risk(s) and benefit(s) of doing nothing. The patient was provided information about the general risks and possible complications associated  with the procedure. These may include, but are not limited to: failure to achieve desired goals, infection, bleeding, organ or nerve damage, allergic reactions, paralysis, and death. In addition, the patient was informed of those risks and complications associated to the procedure, such as failure to decrease pain; infection; bleeding; organ or nerve damage with subsequent damage to sensory, motor, and/or autonomic systems, resulting in permanent pain, numbness, and/or weakness of one or several areas of the body; allergic reactions; (i.e.: anaphylactic reaction); and/or death. Furthermore, the patient was informed of those risks and complications associated with the medications. These include, but are not limited to: allergic reactions (i.e.: anaphylactic or anaphylactoid reaction(s)); adrenal axis suppression; blood sugar elevation that in diabetics may result in ketoacidosis or comma; water retention that in patients with history of congestive heart failure may result in shortness of breath, pulmonary edema, and decompensation with resultant heart failure; weight gain; swelling or edema; medication-induced neural toxicity; particulate matter embolism and blood vessel occlusion with resultant organ, and/or nervous system infarction; and/or aseptic necrosis of one or more joints. Finally, the patient was informed that Medicine is not an exact science; therefore, there is also the possibility of unforeseen or unpredictable risks and/or  possible complications that may result in a catastrophic outcome. The patient indicated having understood very clearly. We have given the patient no guarantees and we have made no promises. Enough time was given to the patient to ask questions, all of which were answered to the patient's satisfaction. Ms. Simao has indicated that she wanted to continue with the procedure. Attestation: I, the ordering provider, attest that I have discussed with the patient the benefits, risks, side-effects, alternatives, likelihood of achieving goals, and potential problems during recovery for the procedure that I have provided informed consent. Date  Time: 04/25/2019 10:50 AM  Pre-Procedure Preparation:  Monitoring: As per clinic protocol. Respiration, ETCO2, SpO2, BP, heart rate and rhythm monitor placed and checked for adequate function Safety Precautions: Patient was assessed for positional comfort and pressure points before starting the procedure. Time-out: I initiated and conducted the "Time-out" before starting the procedure, as per protocol. The patient was asked to participate by confirming the accuracy of the "Time Out" information. Verification of the correct person, site, and procedure were performed and confirmed by me, the nursing staff, and the patient. "Time-out" conducted as per Joint Commission's Universal Protocol (UP.01.01.01). Time: 1144  Description of Procedure:          Area Prepped: Entire shoulder Area Prepping solution: DuraPrep (Iodine Povacrylex [0.7% available iodine] and Isopropyl Alcohol, 74% w/w) Safety Precautions: Aspiration looking for blood return was conducted prior to all injections. At no point did we inject any substances, as a needle was being advanced. No attempts were made at seeking any paresthesias. Safe injection practices and needle disposal techniques used. Medications properly checked for expiration dates. SDV (single dose vial) medications used. Description of the  Procedure: Protocol guidelines were followed. The patient was placed in position over the procedure table. The target area was identified and the area prepped in the usual manner. Skin & deeper tissues infiltrated with local anesthetic. Appropriate amount of time allowed to pass for local anesthetics to take effect. The procedure needles were then advanced to the target area. Proper needle placement secured. Negative aspiration confirmed. Solution injected in intermittent fashion, asking for systemic symptoms every 0.5cc of injectate. The needles were then removed and the area cleansed, making sure to leave some of the prepping solution back to take advantage  of its long term bactericidal properties.    Vitals:   04/25/19 1202 04/25/19 1212 04/25/19 1222 04/25/19 1233  BP: (!) 147/100 (!) 151/87 (!) 148/87 (!) 150/83  Pulse:      Resp: 16 20 20 20   Temp:  (!) 97.3 F (36.3 C)  (!) 97.4 F (36.3 C)  TempSrc:      SpO2: 96% 100% 100% 100%  Weight:      Height:        Start Time: 1144 hrs. End Time: 1203 hrs. Materials:  Needle(s) Type: Spinal Needle Gauge: 25G Length: 3.5-in Medication(s): Please see orders for medications and dosing details. 5 cc solution made of 4 cc of 0.2% ropivacaine, 1 cc of methylprednisolone 40 mg/cc. Imaging Guidance (Non-Spinal):          Type of Imaging Technique: Fluoroscopy Guidance (Non-Spinal) Indication(s): Assistance in needle guidance and placement for procedures requiring needle placement in or near specific anatomical locations not easily accessible without such assistance. Exposure Time: Please see nurses notes. Contrast: Before injecting any contrast, we confirmed that the patient did not have an allergy to iodine, shellfish, or radiological contrast. Once satisfactory needle placement was completed at the desired level, radiological contrast was injected. Contrast injected under live fluoroscopy. No contrast complications. See chart for type and  volume of contrast used. Fluoroscopic Guidance: I was personally present during the use of fluoroscopy. "Tunnel Vision Technique" used to obtain the best possible view of the target area. Parallax error corrected before commencing the procedure. "Direction-depth-direction" technique used to introduce the needle under continuous pulsed fluoroscopy. Once target was reached, antero-posterior, oblique, and lateral fluoroscopic projection used confirm needle placement in all planes. Images permanently stored in EMR. Interpretation: I personally interpreted the imaging intraoperatively. Adequate needle placement confirmed in multiple planes. Appropriate spread of contrast into desired area was observed. No evidence of afferent or efferent intravascular uptake. Permanent images saved into the patient's record.  Antibiotic Prophylaxis:   Anti-infectives (From admission, onward)   None     Indication(s): None identified  Post-operative Assessment:  Post-procedure Vital Signs:  Pulse/HCG Rate: 6070 Temp: (!) 97.4 F (36.3 C) Resp: 20 BP: (!) 150/83 SpO2: 100 %  EBL: None  Complications: No immediate post-treatment complications observed by team, or reported by patient.  Note: The patient tolerated the entire procedure well. A repeat set of vitals were taken after the procedure and the patient was kept under observation following institutional policy, for this type of procedure. Post-procedural neurological assessment was performed, showing return to baseline, prior to discharge. The patient was provided with post-procedure discharge instructions, including a section on how to identify potential problems. Should any problems arise concerning this procedure, the patient was given instructions to immediately contact us, at any time, without hesitation. In any case, we plan to contact the patient by telephone for a follow-up status report regarding this interventional procedure.  Comments:  No  additional relevant information.  Plan of Care  Orders:  Orders Placed This Encounter  Procedures  . DG PAIN CLINIC C-ARM 1-60 MIN NO REPORT    Intraoperative interpretation by procedural physician at [place].    Standing Status:   Standing    Number of Occurrences:   1    Order Specific Question:   Reason for exam:    Answer:   Assistance in needle guidance and placement for procedures requiring needle placement in or near specific anatomical locations not easily accessible without such assistance.   Medications ordered for  procedure: Meds ordered this encounter  Medications  . iohexol (OMNIPAQUE) 180 MG/ML injection 10 mL    Must be Myelogram-compatible. If not available, you may substitute with a water-soluble, non-ionic, hypoallergenic, myelogram-compatible radiological contrast medium.  Marland Kitchen lidocaine (XYLOCAINE) 2 % (with pres) injection 400 mg  . fentaNYL (SUBLIMAZE) injection 25-50 mcg    Make sure Narcan is available in the pyxis when using this medication. In the event of respiratory depression (RR< 8/min): Titrate NARCAN (naloxone) in increments of 0.1 to 0.2 mg IV at 2-3 minute intervals, until desired degree of reversal.  . dexamethasone (DECADRON) injection 10 mg  . ropivacaine (PF) 2 mg/mL (0.2%) (NAROPIN) injection 9 mL  . ropivacaine (PF) 2 mg/mL (0.2%) (NAROPIN) injection 4 mL  . methylPREDNISolone acetate (DEPO-MEDROL) injection 40 mg   Medications administered: We administered lidocaine, fentaNYL, dexamethasone, ropivacaine (PF) 2 mg/mL (0.2%), ropivacaine (PF) 2 mg/mL (0.2%), and methylPREDNISolone acetate.  See the medical record for exact dosing, route, and time of administration.  Follow-up plan:   Return for Keep sch. appt.     Recent Visits Date Type Provider Dept  02/15/19 Office Visit Gillis Santa, MD [facility]  Showing recent visits within past 90 days and meeting all other requirements   Today's Visits Date Type Provider  Dept  04/25/19 Procedure visit Gillis Santa, MD [facility]  Showing today's visits and meeting all other requirements   Future Appointments Date Type Provider Dept  05/31/19 Appointment Gillis Santa, MD [facility]  Showing future appointments within next 90 days and meeting all other requirements   Disposition: Discharge home  Discharge (Date  Time): 04/25/2019; 1234 hrs.   Primary Care Physician: Juline Patch, MD Location: [facility] Note by: Gillis Santa, MD Date: 04/25/2019; Time: 12:47 PM  Disclaimer:  Medicine is not an exact science. The only guarantee in medicine is that nothing is guaranteed. It is important to note that the decision to proceed with this intervention was based on the information collected from the patient. The Data and conclusions were drawn from the patient's questionnaire, the interview, and the physical examination. Because the information was provided in large part by the patient, it cannot be guaranteed that it has not been purposely or unconsciously manipulated. Every effort has been made to obtain as much relevant data as possible for this evaluation. It is important to note that the conclusions that lead to this procedure are derived in large part from the available data. Always take into account that the treatment will also be dependent on availability of resources and existing treatment guidelines, considered by other Pain Management Practitioners as being common knowledge and practice, at the time of the intervention. For Medico-Legal purposes, it is also important to point out that variation in procedural techniques and pharmacological choices are the acceptable norm. The indications, contraindications, technique, and results of the above procedure should only be interpreted and judged by a Board-Certified Interventional Pain Specialist with extensive familiarity and expertise in the same exact procedure and  technique.

## 2019-04-25 NOTE — Progress Notes (Signed)
Safety precautions to be maintained throughout the outpatient stay will include: orient to surroundings, keep bed in low position, maintain call bell within reach at all times, provide assistance with transfer out of bed and ambulation.  

## 2019-04-26 ENCOUNTER — Telehealth: Payer: Self-pay

## 2019-04-26 NOTE — Telephone Encounter (Signed)
Attempted to call patient. About procedure. Unable to leave message .

## 2019-04-29 ENCOUNTER — Other Ambulatory Visit: Payer: Self-pay | Admitting: Student in an Organized Health Care Education/Training Program

## 2019-04-29 DIAGNOSIS — G894 Chronic pain syndrome: Secondary | ICD-10-CM

## 2019-05-02 ENCOUNTER — Telehealth: Payer: Self-pay | Admitting: *Deleted

## 2019-05-02 ENCOUNTER — Telehealth: Payer: Self-pay

## 2019-05-02 NOTE — Telephone Encounter (Signed)
Pt called stating that [facility] still do not have her prescription. Please return the pts call.                                                 Thanks

## 2019-05-02 NOTE — Telephone Encounter (Signed)
[facility] is saying that they didn't get March prescription. Please call and take care of this so she can get her meds

## 2019-05-02 NOTE — Telephone Encounter (Signed)
She states the pharmacy told her they didn't get a script for March. Please look into this for her so she can get her medicine.

## 2019-05-02 NOTE — Telephone Encounter (Signed)
I attempted to call [facility] in [place], was on hold for 12 minutes. I called patient to inform her that the script is at the pharmacy. She will call the pharmacy again.

## 2019-05-02 NOTE — Telephone Encounter (Signed)
Attempted to call patient.  Message states patient is not taking calls at this time. Called pharmacy and they states they did not receive script that was due to be filled on 05-01-2019 for Hydrocodone.

## 2019-05-03 ENCOUNTER — Telehealth: Payer: Self-pay | Admitting: Student in an Organized Health Care Education/Training Program

## 2019-05-03 ENCOUNTER — Other Ambulatory Visit: Payer: Self-pay | Admitting: Student in an Organized Health Care Education/Training Program

## 2019-05-03 DIAGNOSIS — G894 Chronic pain syndrome: Secondary | ICD-10-CM

## 2019-05-03 MED ORDER — HYDROCODONE-ACETAMINOPHEN 7.5-325 MG PO TABS: 1.0000 | ORAL_TABLET | Freq: Three times a day (TID) | ORAL | 0 refills | Status: DC | PRN

## 2019-05-03 NOTE — Telephone Encounter (Signed)
LeFt voicemail with patient that her prescri[ption had been resent by Dr Holley Raring and it was at the pharmacy.

## 2019-05-03 NOTE — Telephone Encounter (Signed)
Patient called back saying her meds are still not at [facility]. Please look into this. She is quite upset.

## 2019-05-03 NOTE — Telephone Encounter (Signed)
Pt left several voice mails stating that Michele Moore still has not received her prescription and she wants to know why.

## 2019-05-20 ENCOUNTER — Ambulatory Visit: Payer: Medicare Other | Attending: Internal Medicine

## 2019-05-20 DIAGNOSIS — Z23 Encounter for immunization: Secondary | ICD-10-CM

## 2019-05-20 NOTE — Progress Notes (Signed)
   Covid-19 Vaccination Clinic  Name:  Michele Moore    MRN: [id number] DOB: [birth year]  05/20/2019  Michele Moore was observed post Covid-19 immunization for 30 minutes based on pre-vaccination screening without incident. She was provided with Vaccine Information Sheet and instruction to access the V-Safe system.   Michele Moore was instructed to call 911 with any severe reactions post vaccine: Marland Kitchen Difficulty breathing  . Swelling of face and throat  . A fast heartbeat  . A bad rash all over body  . Dizziness and weakness   Immunizations Administered    Name Date Dose VIS Date Route   Pfizer COVID-19 Vaccine 05/20/2019  4:25 PM 0.3 mL 02/04/2019 Intramuscular   Manufacturer: [place]   Lot: [id number]   [place]: [id number]

## 2019-05-20 NOTE — Progress Notes (Addendum)
   Covid-19 Vaccination Clinic  Name:  Michele Moore    MRN: [id number] DOB: 1950-11-04  05/20/2019  Michele Moore was observed post Covid-19 immunization for 30 minutes based on pre-vaccination screening .  During the observation period, she experienced an adverse reaction with the following symptoms:  dizziness.chest pain and head ache  Assessment : Time of assessment 1707 .Actions taken: increased observation time, VS taken with BP- 125/78. P - 75, O2- 98%, JUICE OFFERED AND DRINKING, TOOK TO STRETCHER VIS WHEELCHAIR TO CONTINUE TO MONITOR. Vs RECHECKED AT520P, 141/75, P- 77. DENIES FURTHER CHEST PAIN OR DIZZINESS. GIVEN WATER, OFFERED TO CALL DAUGHTER BUT PATIENT REFUSED.     Medications administered: No medication administered.  Disposition: Reports no further symptoms of adverse reaction after observation for 40 minutes. Discharged home.   Immunizations Administered    Name Date Dose VIS Date Route   Pfizer COVID-19 Vaccine 05/20/2019  4:25 PM 0.3 mL 02/04/2019 Intramuscular   Manufacturer: [place]   Lot: [id number]   [place]: [id number]

## 2019-05-24 ENCOUNTER — Telehealth: Payer: Self-pay | Admitting: *Deleted

## 2019-05-24 NOTE — Telephone Encounter (Signed)
Called in attempt to speak with Michele Moore in regards to her COVID vaccine that she received on 05/20/2019. Left a voicemail on patient's mobile number asking for her to call back to discuss any further symptoms or side effects after the vaccine. Will attempt to call again.

## 2019-05-27 ENCOUNTER — Other Ambulatory Visit: Payer: Self-pay | Admitting: Dermatology

## 2019-05-30 ENCOUNTER — Telehealth: Payer: Self-pay

## 2019-05-30 NOTE — Telephone Encounter (Signed)
Called patient to inquire about UDS prior to visit. No answer, left message to call us.

## 2019-05-31 ENCOUNTER — Ambulatory Visit
Admission: RE | Admit: 2019-05-31 | Discharge: 2019-05-31 | Disposition: A | Discharge status: Home / Self Care | Payer: Medicare Other | Source: Ambulatory Visit | Attending: Student in an Organized Health Care Education/Training Program | Admitting: Student in an Organized Health Care Education/Training Program

## 2019-05-31 ENCOUNTER — Encounter: Payer: Self-pay | Admitting: Student in an Organized Health Care Education/Training Program

## 2019-05-31 ENCOUNTER — Ambulatory Visit (HOSPITAL_BASED_OUTPATIENT_CLINIC_OR_DEPARTMENT_OTHER): Payer: Medicare Other | Admitting: Student in an Organized Health Care Education/Training Program

## 2019-05-31 ENCOUNTER — Other Ambulatory Visit: Payer: Self-pay

## 2019-05-31 DIAGNOSIS — M47818 Spondylosis without myelopathy or radiculopathy, sacral and sacrococcygeal region: Secondary | ICD-10-CM | POA: Diagnosis not present

## 2019-05-31 DIAGNOSIS — M1612 Unilateral primary osteoarthritis, left hip: Secondary | ICD-10-CM | POA: Insufficient documentation

## 2019-05-31 DIAGNOSIS — M25552 Pain in left hip: Secondary | ICD-10-CM

## 2019-05-31 DIAGNOSIS — M5136 Other intervertebral disc degeneration, lumbar region: Secondary | ICD-10-CM

## 2019-05-31 DIAGNOSIS — S79912A Unspecified injury of left hip, initial encounter: Secondary | ICD-10-CM | POA: Diagnosis not present

## 2019-05-31 DIAGNOSIS — G894 Chronic pain syndrome: Secondary | ICD-10-CM

## 2019-05-31 DIAGNOSIS — M47816 Spondylosis without myelopathy or radiculopathy, lumbar region: Secondary | ICD-10-CM | POA: Insufficient documentation

## 2019-05-31 MED ORDER — HYDROCODONE-ACETAMINOPHEN 7.5-325 MG PO TABS: 1.0000 | ORAL_TABLET | Freq: Three times a day (TID) | ORAL | 0 refills | Status: DC | PRN

## 2019-05-31 NOTE — Progress Notes (Signed)
On her meter with casting of right now if you notice on his meter it is getting back patient: Michele Moore  Service Category: E/M  Provider:  , MD  DOB: 03/02/1950  DOS: 05/31/2019  Location: Office  MRN: [id number]  Setting: Ambulatory outpatient  Referring Provider: Jones, Deanna C, MD  Type: Established Patient  Specialty: Interventional Pain Management  PCP: Jones, Deanna C, MD  Location: Home  Delivery: TeleHealth     Virtual Encounter - Pain Management PROVIDER NOTE: Information contained herein reflects review and annotations entered in association with encounter. Interpretation of such information and data should be left to medically-trained personnel. Information provided to patient can be located elsewhere in the medical record under "Patient Instructions". Document created using STT-dictation technology, any transcriptional errors that may result from process are unintentional.    Contact & Pharmacy Preferred: [phone] Home: [phone] (home) Mobile: [phone] (mobile) E-mail: No e-mail address on record  [id number] - [place], [place] - [address] [place] [address] [place] [address] Phone: [phone] Fax: [phone]   Pre-screening  Michele Moore offered "in-person" vs "virtual" encounter. She indicated preferring virtual for this encounter.   Reason COVID-19*  Social distancing based on CDC and AMA recommendations.   I contacted Ali L Schramm on 05/31/2019 via telephone.      I clearly identified myself as  , MD. I verified that I was speaking with the correct person using two identifiers (Name: Michele Moore, and date of birth: 02/23/1951).  This visit was completed via telephone due to the restrictions of the COVID-19 pandemic. All issues as above were discussed and addressed but no physical exam was performed. If it was felt that the patient should be evaluated in the office, they were directed there.  The patient verbally consented to this visit. Patient was unable to complete an audio/visual visit due to Technical difficulties and/or Lack of internet. Due to the catastrophic nature of the COVID-19 pandemic, this visit was done through audio contact only.  Location of the patient: home address (see Epic for details)  Location of the provider: office  Consent I sought verbal advanced consent from Michele Moore for virtual visit interactions. I informed Ms. Banfill of possible security and privacy concerns, risks, and limitations associated with providing "not-in-person" medical evaluation and management services. I also informed Ms. Crunk of the availability of "in-person" appointments. Finally, I informed her that there would be a charge for the virtual visit and that she could be  personally, fully or partially, financially responsible for it. [name] expressed understanding and agreed to proceed.   Historic Elements   [name] is a 69 y.o. year old, female patient evaluated today after her last contact with our practice on 05/30/2019. Michele Moore  has a past medical history of Allergy, Anemia, Arthritis, Asthma, Cataract, Chronic kidney disease, Collagen vascular disease (HCC), Family history of adverse reaction to anesthesia, GERD (gastroesophageal reflux disease), Glaucoma, H/O tooth extraction, Hemorrhoid, History of hiatal hernia, Hypertension, Hypothyroidism, Migraines, Mixed hyperlipidemia, Multiple gastric ulcers, Thyroid disease, and Wears dentures. She also  has a past surgical history that includes Carpal tunnel release; Rotator cuff repair; Ankle surgery; Tubal ligation; Colonoscopy (2000?); Upper gi endoscopy (2000?); Tonsillectomy; Fusion of talonavicular joint (Right, 08/03/2015); Colonoscopy with propofol (N/A, 10/22/2017); Esophagogastroduodenoscopy (egd) with propofol (N/A, 10/22/2017); polypectomy (N/A, 10/22/2017); Joint replacement; and Total knee revision (Right, 01/20/2018). Michele Moore  has a   current medication list which includes the following prescription(s): albuterol, allopurinol, aspirin, betamethasone dipropionate, vitamin d3, diclofenac sodium, ferrous sulfate, gabapentin, hydralazine, hydrocodone-acetaminophen, [START ON 06/30/2019] hydrocodone-acetaminophen, [START ON 07/30/2019] hydrocodone-acetaminophen, ketoconazole, levothyroxine, losartan, meclizine, methotrexate, multiple vitamins-calcium, omega-3 acid ethyl esters, omeprazole, polyethyl glycol-propyl glycol, potassium chloride sa, tizanidine, triamcinolone cream, triamterene-hydrochlorothiazide, [DISCONTINUED] fluticasone, and [DISCONTINUED] mometasone. She  reports that she quit smoking about 26 years ago. She has never used smokeless tobacco. She reports that she does not drink alcohol or use drugs. Michele Moore is allergic to ampicillin; penicillins; vancomycin; and vibramycin [doxycycline calcium].   HPI  Today, she is being contacted for both, medication management and a post-procedure assessment.   Left hip pain, worse with weight bearing  LBP with radiation to left hip Also having buttock pain could be related to SI joint dysfunction. She states that cervical facet blocks in her left shoulder steroid injection that was done previously was very helpful for her neck and shoulder pain.  Pharmacotherapy Assessment  Analgesic: 05/03/2019  1   05/03/2019  Hydrocodone-Acetamin 7.5-325  90.00  30 Bi Lat   [id number]   Wal (4612)   0  22.50 MME  Medicare   [place]     Monitoring: [place] PMP: PDMP reviewed during this encounter.       Pharmacotherapy: No side-effects or adverse reactions reported. Compliance: No problems identified. Effectiveness: Clinically acceptable. Plan: Refer to "POC".  UDS:  Summary  Date Value Ref Range Status  03/30/2018 FINAL  Final    Comment:    ==================================================================== TOXASSURE SELECT 13  (MW) ==================================================================== Test                             Result       Flag       Units Drug Present and Declared for Prescription Verification   Hydrocodone                    824          EXPECTED   ng/mg creat   Hydromorphone                  233          EXPECTED   ng/mg creat   Dihydrocodeine                 100          EXPECTED   ng/mg creat   Norhydrocodone                 545          EXPECTED   ng/mg creat    Sources of hydrocodone include scheduled prescription    medications. Hydromorphone, dihydrocodeine and norhydrocodone are    expected metabolites of hydrocodone. Hydromorphone and    dihydrocodeine are also available as scheduled prescription    medications. ==================================================================== Test                      Result    Flag   Units      Ref Range   Creatinine              127              mg/dL      >=20 ==================================================================== Declared Medications:  The flagging and interpretation on this report are based on the  following declared medications.  Unexpected results may arise  from  inaccuracies in the declared medications.  **Note: The testing scope of this panel includes these medications:  Hydrocodone (Norco)  **Note: The testing scope of this panel does not include following  reported medications:  Acetaminophen (Norco)  Albuterol  Allopurinol  Calcium  Cholecalciferol  Diclofenac  Fluticasone  Gabapentin  Hydralazine  Hydrochlorothiazide (Maxzide)  Iron (Ferrous Sulfate)  Ketoconazole (Nizoral)  Levothyroxine (Synthroid)  Meclizine (Antivert)  Multivitamin  Nystatin (Mycostatin)  Omega-3 Fatty Acids (Lovaza)  Omeprazole (Prilosec)  Polyethylene Glycol  Potassium (K-Dur)  Ranitidine (Zantac)  Tizanidine (Zanaflex)  Triamcinolone (Kenalog)  Triamterene (Maxzide)  Undefined Miscellaneous  Drug ==================================================================== For clinical consultation, please call [phone]. ====================================================================    Laboratory Chemistry Profile   Renal Lab Results  Component Value Date   BUN 31 (H) 04/12/2019   CREATININE 1.79 (H) 04/12/2019   BCR 17 04/12/2019   GFRAA 33 (L) 04/12/2019   GFRNONAA 28 (L) 04/12/2019     Hepatic Lab Results  Component Value Date   AST 25 12/02/2018   ALT 24 12/02/2018   ALBUMIN 4.3 04/12/2019   ALKPHOS 88 12/02/2018     Electrolytes Lab Results  Component Value Date   NA 145 (H) 04/12/2019   K 4.7 04/12/2019   CL 107 (H) 04/12/2019   CALCIUM 9.3 04/12/2019   MG 2.0 10/01/2018   PHOS 4.0 04/12/2019     Bone No results found for: VD25OH, VD125OH2TOT, SJ6283MO2, HU7654YT0, 25OHVITD1, 25OHVITD2, 25OHVITD3, TESTOFREE, TESTOSTERONE   Inflammation (CRP: Acute Phase) (ESR: Chronic Phase) Lab Results  Component Value Date   CRP <0.8 11/05/2017   ESRSEDRATE 52 (H) 11/05/2017       Note: Above Lab results reviewed.   Assessment  The primary encounter diagnosis was Chronic pain syndrome. Diagnoses of Left hip pain, SI joint arthritis (left), Lumbar degenerative disc disease, Lumbar spondylosis, and Primary osteoarthritis of left hip were also pertinent to this visit.  Plan of Care   Ms. [name] has a current medication list which includes the following long-term medication(s): albuterol, allopurinol, ferrous sulfate, gabapentin, hydralazine, levothyroxine, losartan, omega-3 acid ethyl esters, omeprazole, potassium chloride sa, triamterene-hydrochlorothiazide, [DISCONTINUED] fluticasone, and [DISCONTINUED] mometasone.  Pharmacotherapy (Medications Ordered): Meds ordered this encounter  Medications  . HYDROcodone-acetaminophen (NORCO) 7.5-325 MG tablet    Sig: Take 1 tablet by mouth every 8 (eight) hours as needed for severe pain. Must last 30 days.     Dispense:  90 tablet    Refill:  0    Chronic Pain. (STOP Act - Not applicable). Fill one day early if closed on scheduled refill date.  Marland Kitchen HYDROcodone-acetaminophen (NORCO) 7.5-325 MG tablet    Sig: Take 1 tablet by mouth every 8 (eight) hours as needed for severe pain. Must last 30 days.    Dispense:  90 tablet    Refill:  0    Chronic Pain. (STOP Act - Not applicable). Fill one day early if closed on scheduled refill date.  Marland Kitchen HYDROcodone-acetaminophen (NORCO) 7.5-325 MG tablet    Sig: Take 1 tablet by mouth every 8 (eight) hours as needed for severe pain. Must last 30 days.    Dispense:  90 tablet    Refill:  0    Chronic Pain. (STOP Act - Not applicable). Fill one day early if closed on scheduled refill date.   Orders:  Orders Placed This Encounter  Procedures  . HIP INJECTION    Standing Status:   Future    Standing Expiration Date:   06/30/2019  Scheduling Instructions:     Side: LEFT     Sedation: Without Sedation.     Timeframe: As soon as schedule allows  . SACROILIAC JOINT INJECTION    Standing Status:   Future    Standing Expiration Date:   06/30/2019    Scheduling Instructions:     Side: LEFT     Sedation: without     Timeframe: ASAP    Order Specific Question:   Where will this procedure be performed?    Answer:   [facility]  . DG HIP UNILAT W OR W/O PELVIS 2-3 VIEWS LEFT    Standing Status:   Future    Standing Expiration Date:   05/30/2020    Scheduling Instructions:     Please describe any evidence of DJD, such as joint narrowing, asymmetry, cysts, or any anomalies in bone density, production, or erosion.    Order Specific Question:   Reason for Exam (SYMPTOM  OR DIAGNOSIS REQUIRED)    Answer:   Left hip pain/arthralgia    Order Specific Question:   Preferred imaging location?    Answer:   [facility]    Order Specific Question:   Call Results- Best Contact Number?    Answer:   [phone] (Pain Clinic facility) (Dr. Naveira)  . ToxASSURE  Select 13 (MW), Urine    Volume: 30 ml(s). Minimum 3 ml of urine is needed. Document temperature of fresh sample. Indications: Long term (current) use of opiate analgesic (Z79.891)   Follow-up plan:   Return in about 1 week (around 06/07/2019) for Left hip, SI joint injection without sedation.     Status post left C4-C7 cervical facet blocks, left glenohumeral posterior shoulder joint injection on 04/25/2019    Recent Visits Date Type Provider Dept  04/25/19 Procedure visit , , MD [facility]  Showing recent visits within past 90 days and meeting all other requirements   Today's Visits Date Type Provider Dept  05/31/19 Office Visit , , MD [facility]  Showing today's visits and meeting all other requirements   Future Appointments No visits were found meeting these conditions.  Showing future appointments within next 90 days and meeting all other requirements   I discussed the assessment and treatment plan with the patient. The patient was provided an opportunity to ask questions and all were answered. The patient agreed with the plan and demonstrated an understanding of the instructions.  Patient advised to call back or seek an in-person evaluation if the symptoms or condition worsens.  Duration of encounter: 25minutes.  Note by:  , MD Date: 05/31/2019; Time: 10:23 AM 

## 2019-06-01 ENCOUNTER — Telehealth: Payer: Self-pay | Admitting: Student in an Organized Health Care Education/Training Program

## 2019-06-01 NOTE — Telephone Encounter (Signed)
Attempted to call patient, message left. 

## 2019-06-01 NOTE — Telephone Encounter (Signed)
Patient would like to know results of xrays 

## 2019-06-02 NOTE — Telephone Encounter (Signed)
Results read to patient. 

## 2019-06-03 LAB — TOXASSURE SELECT 13 (MW), URINE

## 2019-06-10 ENCOUNTER — Ambulatory Visit: Payer: Medicare Other

## 2019-06-17 ENCOUNTER — Ambulatory Visit: Payer: Medicare Other | Attending: Internal Medicine

## 2019-06-17 DIAGNOSIS — Z23 Encounter for immunization: Secondary | ICD-10-CM

## 2019-06-17 NOTE — Progress Notes (Signed)
   Covid-19 Vaccination Clinic  Name:  PAMELLA SAMONS    MRN: [id number] DOB: 12/29/1950  06/17/2019  Ms. Douty was observed post Covid-19 immunization for 30 minutes without incident. She was provided with Vaccine Information Sheet and instruction to access the V-Safe system.   Ms. Hakimian was instructed to call 911 with any severe reactions post vaccine: Marland Kitchen Difficulty breathing  . Swelling of face and throat  . A fast heartbeat  . A bad rash all over body  . Dizziness and weakness   Immunizations Administered    Name Date Dose VIS Date Route   Pfizer COVID-19 Vaccine 06/17/2019  3:26 PM 0.3 mL 04/20/2018 Intramuscular   Manufacturer: [employer]   Lot: [id number]   [place]: [id number]

## 2019-06-21 ENCOUNTER — Emergency Department: Payer: Medicare Other

## 2019-06-21 ENCOUNTER — Other Ambulatory Visit: Payer: Self-pay

## 2019-06-21 ENCOUNTER — Encounter: Payer: Self-pay | Admitting: *Deleted

## 2019-06-21 ENCOUNTER — Emergency Department
Admission: EM | Admit: 2019-06-21 | Discharge: 2019-06-22 | Disposition: A | Discharge status: Home / Self Care | Payer: Medicare Other | Attending: Emergency Medicine | Admitting: Emergency Medicine

## 2019-06-21 DIAGNOSIS — M25571 Pain in right ankle and joints of right foot: Secondary | ICD-10-CM | POA: Insufficient documentation

## 2019-06-21 DIAGNOSIS — I129 Hypertensive chronic kidney disease with stage 1 through stage 4 chronic kidney disease, or unspecified chronic kidney disease: Secondary | ICD-10-CM | POA: Insufficient documentation

## 2019-06-21 DIAGNOSIS — R001 Bradycardia, unspecified: Secondary | ICD-10-CM | POA: Diagnosis not present

## 2019-06-21 DIAGNOSIS — E039 Hypothyroidism, unspecified: Secondary | ICD-10-CM | POA: Insufficient documentation

## 2019-06-21 DIAGNOSIS — R609 Edema, unspecified: Secondary | ICD-10-CM | POA: Diagnosis not present

## 2019-06-21 DIAGNOSIS — Z87891 Personal history of nicotine dependence: Secondary | ICD-10-CM | POA: Diagnosis not present

## 2019-06-21 DIAGNOSIS — M7989 Other specified soft tissue disorders: Secondary | ICD-10-CM | POA: Diagnosis not present

## 2019-06-21 DIAGNOSIS — N183 Chronic kidney disease, stage 3 unspecified: Secondary | ICD-10-CM | POA: Insufficient documentation

## 2019-06-21 DIAGNOSIS — R202 Paresthesia of skin: Secondary | ICD-10-CM | POA: Diagnosis not present

## 2019-06-21 DIAGNOSIS — Z79899 Other long term (current) drug therapy: Secondary | ICD-10-CM | POA: Diagnosis not present

## 2019-06-21 DIAGNOSIS — R52 Pain, unspecified: Secondary | ICD-10-CM | POA: Diagnosis not present

## 2019-06-21 MED ORDER — ACETAMINOPHEN 500 MG PO TABS
1000.0000 mg | ORAL_TABLET | Freq: Once | ORAL | Status: AC
  Administered 2019-06-22: 1000 mg via ORAL
  Filled 2019-06-21: qty 2

## 2019-06-21 MED ORDER — OXYCODONE HCL 5 MG PO TABS
5.0000 mg | ORAL_TABLET | Freq: Once | ORAL | Status: AC
  Administered 2019-06-22: 5 mg via ORAL
  Filled 2019-06-21: qty 1

## 2019-06-21 NOTE — ED Triage Notes (Signed)
Pt to ED after walking last night and feeling a "pop" in her right ankle. Pt has had 2 major surgeries on her right ankle with complications. Pt reporting fear that the hardware has loosened or become infection. Ankle is swollen and red upon arrival. PT reporting intermittent numbness in her right foot. Pain worse with movement.

## 2019-06-22 ENCOUNTER — Telehealth: Payer: Self-pay | Admitting: Family Medicine

## 2019-06-22 DIAGNOSIS — M25571 Pain in right ankle and joints of right foot: Secondary | ICD-10-CM | POA: Diagnosis not present

## 2019-06-22 NOTE — Discharge Instructions (Addendum)
Elevate the leg. Contact Dr. Vickki Muff in the morning for close follow up. Return to the ER for worsening swelling, if the skin feels hot to the touch, if your leg is swollen or painful, if you have a fever.

## 2019-06-22 NOTE — ED Provider Notes (Signed)
[facility] Emergency Department Provider Note  ____________________________________________  Time seen: Approximately 12:29 AM  I have reviewed the triage vital signs and the nursing notes.   HISTORY  Chief Complaint Ankle Pain   HPI Michele Moore is a 69 y.o. female presents for evaluation of right ankle pain.  Patient has had several surgeries and has several screws in her right ankle done by Dr. Vickki Muff.  Last surgery was several years ago.  She reports that for the last month she feels that every time she walks she hears pops in the joint.  On Monday she was standing up in the kitchen cooking when she felt 1 of these pops.  Since then she started having progressively worsening pain.  She does have a history of chronic pain but reports that the pain this evening became severe.  She still able to bear weight.  She also noticed swelling and redness of the ankle which started earlier today.  No fever or chills.  The pain is on the right ankle and she denies any leg pain.  She is also complaining of mild intermittent numbness of her toes.   Past Medical History:  Diagnosis Date  . Allergy   . Anemia   . Arthritis   . Asthma   . Cataract   . Chronic kidney disease    STAGE 3 PER DR EASON 08/02/15  . Collagen vascular disease ([place])   . Family history of adverse reaction to anesthesia    sister difficult to put to sleep  . GERD (gastroesophageal reflux disease)   . Glaucoma   . H/O tooth extraction    all lower teeth 1/19  . Hemorrhoid   . History of hiatal hernia   . Hypertension   . Hypothyroidism   . Migraines   . Mixed hyperlipidemia   . Multiple gastric ulcers   . Thyroid disease   . Wears dentures    partial upper and lower    Patient Active Problem List   Diagnosis Date Noted  . Non-seasonal allergic rhinitis 04/12/2019  . Benign essential hypertension 03/06/2019  . Proteinuria 03/06/2019  . Trochanteric bursitis of left hip 10/04/2018  .  SI joint arthritis (left) 10/04/2018  . Exertional chest pain 09/30/2018  . CKD (chronic kidney disease), stage III 09/30/2018  . Psoriatic arthritis ([place]) 04/23/2018  . Trigger ring finger of left hand 04/14/2018  . Female pelvic pain 01/20/2018  . History of revision of total knee arthroplasty 01/20/2018  . Iron deficiency anemia   . Heme + stool   . Acute gastritis without hemorrhage   . Gastric polyps   . Benign neoplasm of ascending colon   . Benign neoplasm of transverse colon   . Polyp of sigmoid colon   . Hypertension 10/01/2017  . Hypothyroidism 10/01/2017  . Gastroesophageal reflux disease 10/01/2017  . Hypokalemia 10/01/2017  . Mixed hyperlipidemia 10/01/2017  . Reactive airway disease 10/01/2017  . Bradycardia 08/24/2017  . Severe obesity (BMI 35.0-35.9 with comorbidity) ([place]) 08/18/2017  . Chronic gouty arthropathy without tophi 06/25/2017  . Encounter for long-term (current) use of high-risk medication 06/25/2017  . Positive ANA (antinuclear antibody) 06/17/2017  . Cervical spondylosis without myelopathy 06/16/2017  . Osteoarthritis of left shoulder due to rotator cuff injury 06/16/2017  . Sprain of anterior talofibular ligament of right ankle 06/16/2017  . Lumbar radiculopathy 04/21/2017  . Lumbar degenerative disc disease 04/21/2017  . Chronic pain syndrome 04/21/2017  . Spinal stenosis, lumbar region, with neurogenic  claudication 04/21/2017  . SS-A antibody positive 03/05/2017  . Pain, lower extremity 08/03/2015  . DOE (dyspnea on exertion) 12/28/2013    Past Surgical History:  Procedure Laterality Date  . ANKLE SURGERY    . CARPAL TUNNEL RELEASE     x3  . COLONOSCOPY  2000?  . COLONOSCOPY WITH PROPOFOL N/A 10/22/2017   Procedure: COLONOSCOPY WITH PROPOFOL;  Surgeon: Lucilla Lame, MD;  Location: [facility];  Service: Endoscopy;  Laterality: N/A;  . ESOPHAGOGASTRODUODENOSCOPY (EGD) WITH PROPOFOL N/A 10/22/2017   Procedure:  ESOPHAGOGASTRODUODENOSCOPY (EGD) WITH PROPOFOL;  Surgeon: Lucilla Lame, MD;  Location: [facility];  Service: Endoscopy;  Laterality: N/A;  . FUSION OF TALONAVICULAR JOINT Right 08/03/2015   Procedure: TAILOR NAVICULAR JOINT FUSION - RIGHT ;  Surgeon: [name], DPM;  Location: [facility];  Service: Podiatry;  Laterality: Right;  . JOINT REPLACEMENT     knee x 3 ,right x1 and left x2  . POLYPECTOMY N/A 10/22/2017   Procedure: POLYPECTOMY;  Surgeon: Lucilla Lame, MD;  Location: [facility];  Service: Endoscopy;  Laterality: N/A;  . ROTATOR CUFF REPAIR    . TONSILLECTOMY    . TOTAL KNEE REVISION Right 01/20/2018   Procedure: POLYETHYLENE EXCHANGE;  Surgeon: Dereck Leep, MD;  Location: [facility];  Service: Orthopedics;  Laterality: Right;  . TUBAL LIGATION    . UPPER GI ENDOSCOPY  2000?    Prior to Admission medications   Medication Sig Start Date End Date Taking? Authorizing Provider  albuterol (VENTOLIN HFA) 108 (90 Base) MCG/ACT inhaler Inhale 2 puffs into the lungs every 4 (four) hours as needed for wheezing. 04/12/19   Juline Patch, MD  allopurinol (ZYLOPRIM) 100 MG tablet Take 200 mg by mouth daily.  08/06/17   [provider]  ASPIRIN 81 PO Take 81 mg by mouth.    [provider]  betamethasone dipropionate 0.05 % cream APPLY TO PSORIASIS ON HANDS TWICE DAILY ONLY. AVOID FACE, GROIN AND AXILLA 05/30/19   Ralene Bathe, MD  Cholecalciferol (VITAMIN D3) 2000 units TABS Take 2,000 Units by mouth daily.    [provider]  diclofenac sodium (VOLTAREN) 1 % GEL Apply 2 g topically 3 (three) times daily.  08/11/17   [provider]  ferrous sulfate 325 (65 FE) MG tablet Take 325 mg by mouth daily with breakfast.     [provider]  gabapentin (NEURONTIN) 600 MG tablet Take 1 tablet (600 mg total) by mouth at bedtime. 02/28/19 08/27/19  Gillis Santa, MD  hydrALAZINE (APRESOLINE) 50 MG tablet Take 1 tablet (50 mg total) by mouth 2  (two) times daily. 04/12/19   Juline Patch, MD  HYDROcodone-acetaminophen (NORCO) 7.5-325 MG tablet Take 1 tablet by mouth every 8 (eight) hours as needed for severe pain. Must last 30 days. 05/31/19 06/30/19  Gillis Santa, MD  HYDROcodone-acetaminophen (NORCO) 7.5-325 MG tablet Take 1 tablet by mouth every 8 (eight) hours as needed for severe pain. Must last 30 days. 06/30/19 07/30/19  Gillis Santa, MD  HYDROcodone-acetaminophen (NORCO) 7.5-325 MG tablet Take 1 tablet by mouth every 8 (eight) hours as needed for severe pain. Must last 30 days. 07/30/19 08/29/19  Gillis Santa, MD  ketoconazole (NIZORAL) 2 % cream APP AA ON THE FEET HS FOR FUNGAL INFECTION. 11/26/18   [provider]  levothyroxine (SYNTHROID) 200 MCG tablet TAKE 1 TABLET(200 MCG) BY MOUTH DAILY BEFORE BREAKFAST 04/12/19   Juline Patch, MD  losartan (COZAAR) 25 MG tablet Take  1 tablet (25 mg total) by mouth daily. 04/12/19   Juline Patch, MD  meclizine (ANTIVERT) 12.5 MG tablet Take 1 tablet (12.5 mg total) by mouth 2 (two) times daily as needed for dizziness. 09/14/17   Juline Patch, MD  methotrexate (RHEUMATREX) 2.5 MG tablet Take 20 mg by mouth every Friday. Take 5 tablets by mouth once weekly 09/29/18   [provider]  Multiple Vitamins-Calcium (ONE-A-DAY WOMENS FORMULA PO) Take 1 tablet by mouth daily.    [provider]  omega-3 acid ethyl esters (LOVAZA) 1 g capsule Take 1 capsule (1 g total) by mouth 2 (two) times daily. 04/12/19   Juline Patch, MD  omeprazole (PRILOSEC) 40 MG capsule Take 1 capsule (40 mg total) by mouth daily. 04/12/19   Juline Patch, MD  Polyethyl Glycol-Propyl Glycol (SYSTANE OP) Place 1 drop into both eyes daily as needed (dry eyes).     [provider]  potassium chloride SA (KLOR-CON) 20 MEQ tablet Take 0.5 tablets (10 mEq total) by mouth daily. 04/12/19   Juline Patch, MD  tiZANidine (ZANAFLEX) 4 MG tablet Take 1 tablet (4 mg total) by mouth 2 (two) times daily as  needed for muscle spasms. 11/18/18   Gillis Santa, MD  triamcinolone cream (KENALOG) 0.1 % Apply 1 application topically every morning.    [provider]  triamterene-hydrochlorothiazide (MAXZIDE) 75-50 MG tablet Take 1 tablet by mouth daily. 04/18/19   Juline Patch, MD  fluticasone (FLONASE) 50 MCG/ACT nasal spray SHAKE LIQUID AND USE 1 SPRAY IN [facility] NOSTRIL DAILY 02/28/19 04/24/19  Juline Patch, MD  mometasone (NASONEX) 50 MCG/ACT nasal spray Place 2 sprays into the nose daily. 04/12/19 04/24/19  Juline Patch, MD    Allergies Ampicillin, Penicillins, Vancomycin, and Vibramycin [doxycycline calcium]  Family History  Problem Relation Age of Onset  . Heart failure Mother   . Gout Mother   . Arthritis Mother   . Hypertension Mother   . Stroke Mother   . Heart failure Father   . Diabetes Father   . Hyperlipidemia Father   . COPD Sister   . Cancer Maternal Aunt        breast  . Cancer Maternal Uncle        kidney  . Breast cancer Neg Hx     Social History Social History   Tobacco Use  . Smoking status: Former Smoker    Quit date: 02/24/1993    Years since quitting: 26.3  . Smokeless tobacco: Never Used  Substance Use Topics  . Alcohol use: No    Alcohol/week: 0.0 standard drinks  . Drug use: No    Review of Systems  Constitutional: Negative for fever. Eyes: Negative for visual changes. ENT: Negative for sore throat. Neck: No neck pain  Cardiovascular: Negative for chest pain. Respiratory: Negative for shortness of breath. Gastrointestinal: Negative for abdominal pain, vomiting or diarrhea. Genitourinary: Negative for dysuria. Musculoskeletal: Negative for back pain. + R ankle pain Skin: Negative for rash. Neurological: Negative for headaches, weakness or numbness. Psych: No SI or HI  ____________________________________________   PHYSICAL EXAM:  VITAL SIGNS: ED Triage Vitals  Enc Vitals Group     BP 06/21/19 2048 (!) 157/94     Pulse Rate  06/21/19 2048 (!) 58     Resp 06/21/19 2048 16     Temp 06/21/19 2048 98.9 F (37.2 C)     Temp Source 06/21/19 2048 Oral     SpO2 06/21/19  2048 98 %     Weight 06/21/19 2049 269 lb (122 kg)     Height 06/21/19 2049 5\' 11"  (1.803 m)     Head Circumference --      Peak Flow --      Pain Score 06/21/19 2048 10     Pain Loc --      Pain Edu? --      Excl. in [place]? --     Constitutional: Alert and oriented. Well appearing and in no apparent distress. HEENT:      Head: Normocephalic and atraumatic.         Eyes: Conjunctivae are normal. Sclera is non-icteric.       Mouth/Throat: Mucous membranes are moist.       Neck: Supple with no signs of meningismus. Cardiovascular: Regular rate and rhythm.  Respiratory: Normal respiratory effort. Lungs are clear to auscultation bilaterally.  Musculoskeletal: R ankle is mildly swollen when compared to the L, skin is slightly erythematous but cool to the touch, ROM is diminished due to surgical changes. Distal foot is warm and well perfused. Neurologic: Normal speech and language. Face is symmetric. Moving all extremities. No gross focal neurologic deficits are appreciated. Skin: Skin is warm, dry and intact. No rash noted. Psychiatric: Mood and affect are normal. Speech and behavior are normal.  ____________________________________________   LABS (all labs ordered are listed, but only abnormal results are displayed)  Labs Reviewed - No data to display ____________________________________________  EKG  none  ____________________________________________  RADIOLOGY  I have personally reviewed the images performed during this visit and I agree with the Radiologist's read.   Interpretation by Radiologist:  DG Ankle Complete Right  Result Date: 06/21/2019 CLINICAL DATA:  Pain following walking, initial encounter EXAM: RIGHT ANKLE - COMPLETE 3+ VIEW COMPARISON:  05/13/2017 FINDINGS: Postsurgical changes are noted with fusion of the talus and  calcaneus as well as the navicular bone and talus. There is again noted fracture of 1 of the fixation screws stable from the prior exam. There is a screw identified adjacent to the medial malleolus anteriorly which appears displaced but is stable from the prior exam. No acute fracture is noted. Generalized soft tissue swelling is seen. IMPRESSION: Postsurgical changes without acute bony abnormality. Fracture of prior screws is noted. The overall appearance of the fusion is stable. Diffuse soft tissue swelling is noted. Electronically Signed   By: Inez Catalina M.D.   On: 06/21/2019 21:13     ____________________________________________   PROCEDURES  Procedure(s) performed: None Procedures Critical Care performed:  None ____________________________________________   INITIAL IMPRESSION / ASSESSMENT AND PLAN / ED COURSE  69 y.o. female presents for evaluation of right ankle pain.  Patient with chronic pain however worse for the last few days after hearing a pop on her ankle.  Her ankle does look slightly swollen with no signs of infection. There is no leg pain or swelling therefore clinically no concerns for DVT.  Skin slightly erythematous but cool to the touch.  Range of motion is decreased due to surgical changes but not concerning for an infectious joint.  X-rays visualize and unchanged from prior from 2019, confirmed by radiology.  She does have several broken screws but the integrity of the joint seems intact and there are no changes on imaging since 2019.  She received 1000 mg of Tylenol and 5 mg of oxycodone for pain.  She is able to bear weight.  She does have a walker and wheelchair at home.  Recommended close follow-up with Dr. Vickki Muff for further evaluation.  Discussed my standard return precautions for worsening swelling, leg pain or swelling, discoloration, warmth of the joint, fever. Otherwise recommended contacting Dr. Vickki Muff in the morning for close follow-up with.  Old medical records  reviewed.      _____________________________________________ Please note:  Patient was evaluated in Emergency Department today for the symptoms described in the history of present illness. Patient was evaluated in the context of the global COVID-19 pandemic, which necessitated consideration that the patient might be at risk for infection with the SARS-CoV-2 virus that causes COVID-19. Institutional protocols and algorithms that pertain to the evaluation of patients at risk for COVID-19 are in a state of rapid change based on information released by regulatory bodies including the CDC and federal and state organizations. These policies and algorithms were followed during the patient's care in the ED.  Some ED evaluations and interventions may be delayed as a result of limited staffing during the pandemic.   [place] Controlled Substance Database was reviewed by me. ____________________________________________   FINAL CLINICAL IMPRESSION(S) / ED DIAGNOSES   Final diagnoses:  Right ankle pain, unspecified chronicity      NEW MEDICATIONS STARTED DURING THIS VISIT:  ED Discharge Orders    None       Note:  This document was prepared using Dragon voice recognition software and may include unintentional dictation errors.    Alfred Levins, [place], MD 06/22/19 0040

## 2019-06-22 NOTE — Telephone Encounter (Signed)
Medication Refill - Medication: hydrALAZINE (APRESOLINE) 50 MG tablet   Has the patient contacted their pharmacy? Yes.   (Agent: If no, request that the patient contact the pharmacy for the refill.) (Agent: If yes, when and what did the pharmacy advise?) pharmacy has faxed office several times with no reply   Preferred Pharmacy (with phone number or street name):  [id number], [place] [address] AT [place] Phone:  [phone]  Fax:  [phone]       Agent: Please be advised that RX refills may take up to 3 business days. We ask that you follow-up with your pharmacy.

## 2019-06-23 ENCOUNTER — Other Ambulatory Visit: Payer: Self-pay

## 2019-06-23 DIAGNOSIS — I10 Essential (primary) hypertension: Secondary | ICD-10-CM

## 2019-06-23 DIAGNOSIS — I872 Venous insufficiency (chronic) (peripheral): Secondary | ICD-10-CM | POA: Diagnosis not present

## 2019-06-23 DIAGNOSIS — M722 Plantar fascial fibromatosis: Secondary | ICD-10-CM | POA: Diagnosis not present

## 2019-06-23 DIAGNOSIS — M19071 Primary osteoarthritis, right ankle and foot: Secondary | ICD-10-CM | POA: Diagnosis not present

## 2019-06-23 MED ORDER — HYDRALAZINE HCL 50 MG PO TABS: 50.0000 mg | ORAL_TABLET | Freq: Two times a day (BID) | ORAL | 1 refills | Status: DC

## 2019-06-23 NOTE — Telephone Encounter (Signed)
Sent in #60 with 1 refill

## 2019-06-24 ENCOUNTER — Other Ambulatory Visit: Payer: Self-pay | Admitting: Student in an Organized Health Care Education/Training Program

## 2019-06-27 IMAGING — CT CT HEAD W/O CM
3 series · 15 of 47 positions shown, 18 images · non-contrast
Comparison: Head CT 05/13/2017

CLINICAL DATA: Orthostatic dizziness

EXAM:
CT HEAD WITHOUT CONTRAST
TECHNIQUE: Contiguous axial images were obtained from the base of the skull
through the vertex without intravenous contrast.

[Series 2: head wo · axial · 0.46mm/px · z∈[-200,-75]mm · 9 of 30 slices shown, 12 images]
[im 3/30  brain]
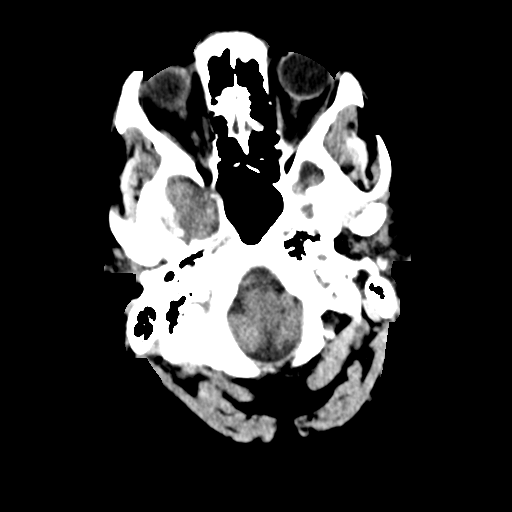
[im 3/30  bone]
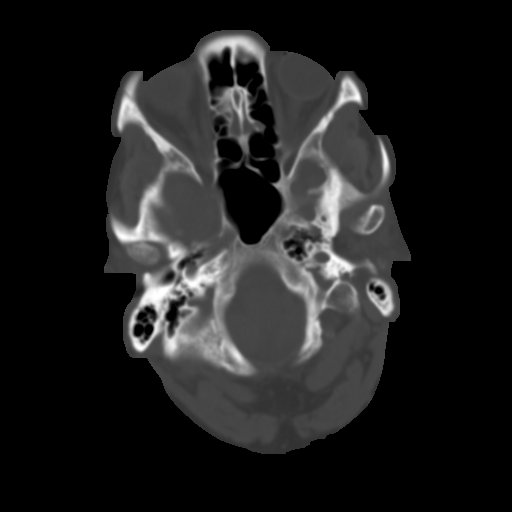
[im 6/30  brain]
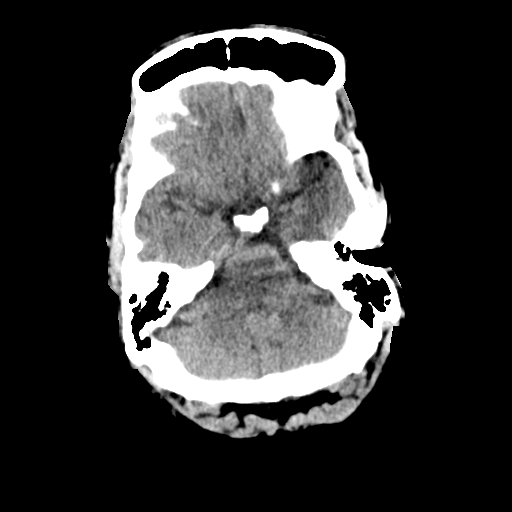
[im 9/30  brain]
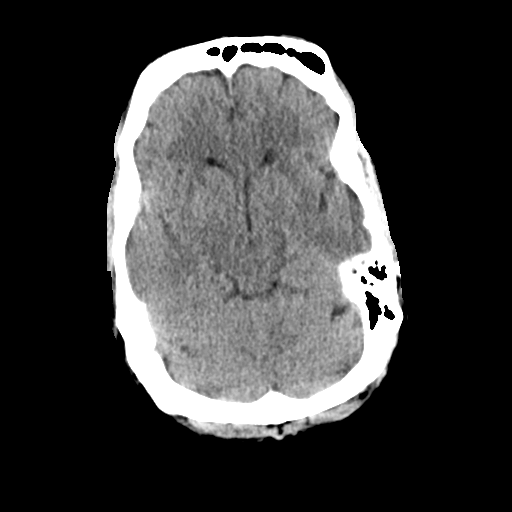
[im 12/30  brain]
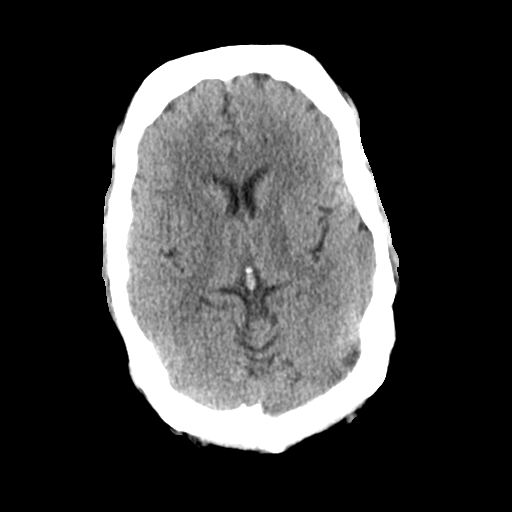
[im 16/30  brain]
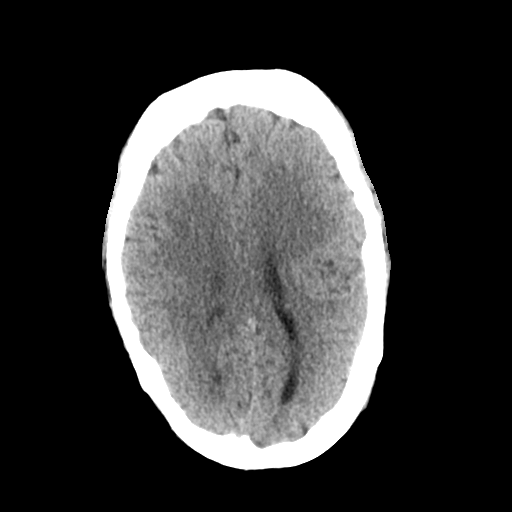
[im 16/30  bone]
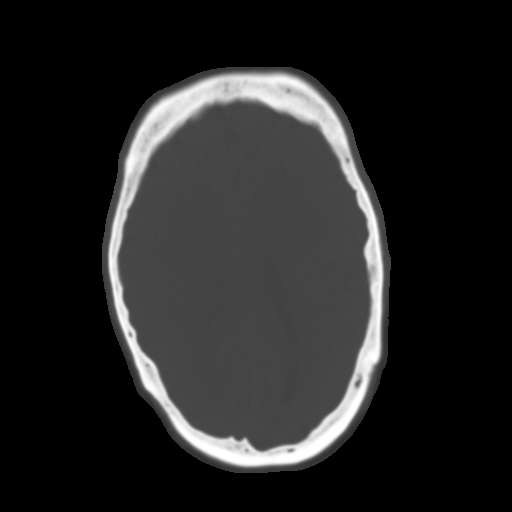
[im 19/30  brain]
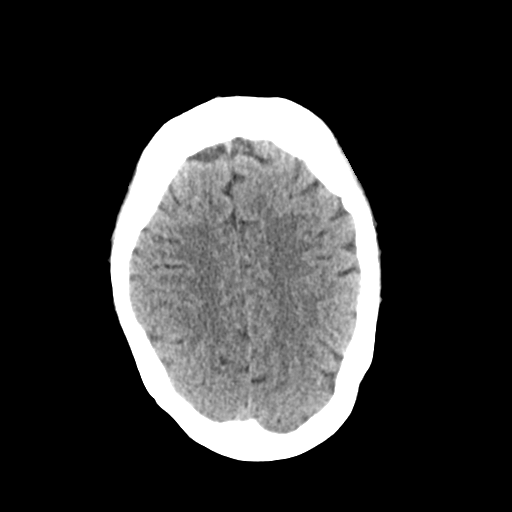
[im 22/30  brain]
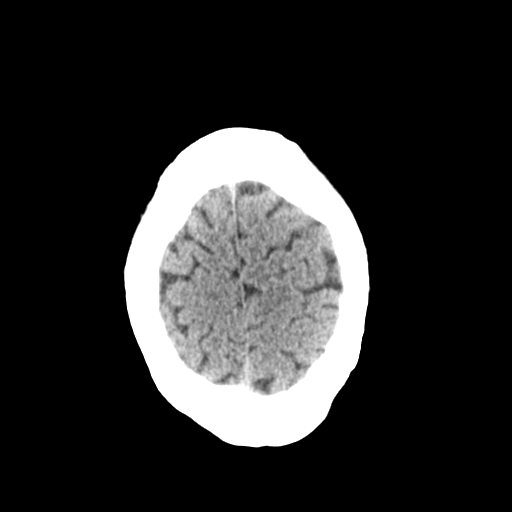
[im 25/30  brain]
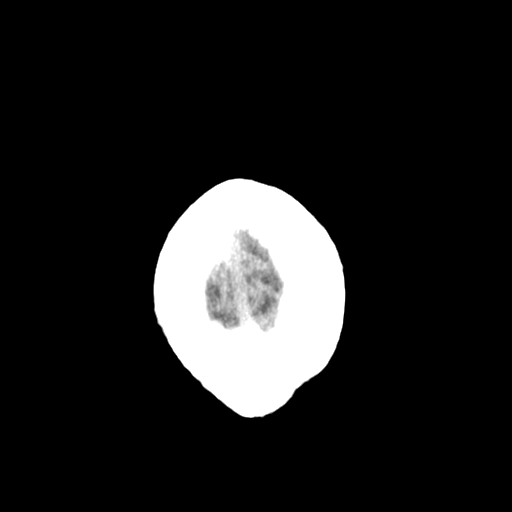
[im 28/30  brain]
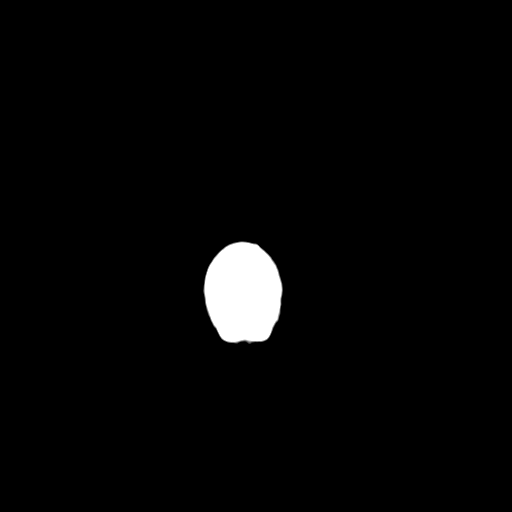
[im 28/30  bone]
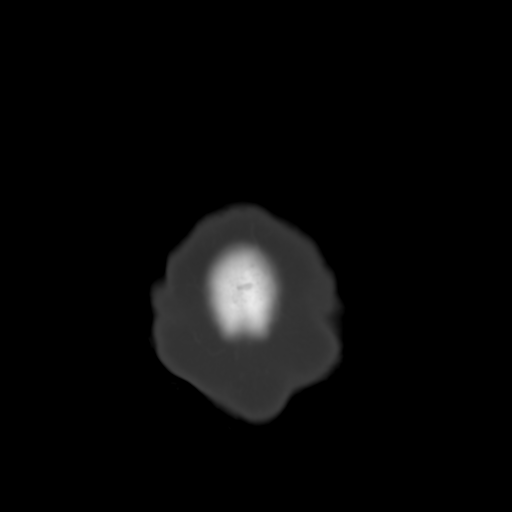

[Series 4: coronal soft tissue · coronal · 0.28mm/px · 3 of 69 slices shown]
[im 23/69  brain]
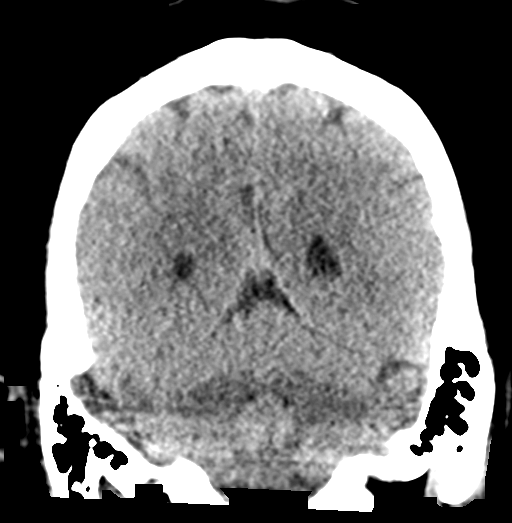
[im 31/69  brain]
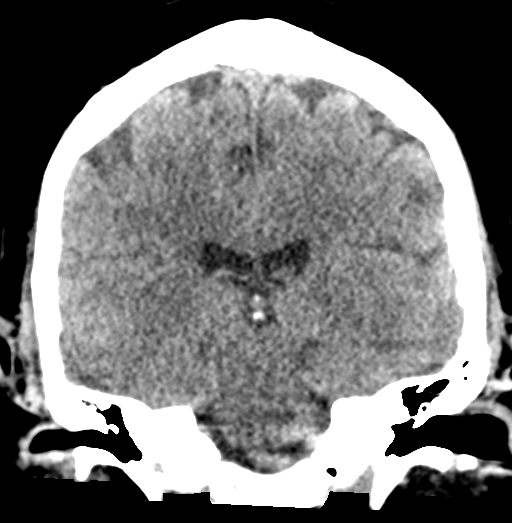
[im 38/69  brain]
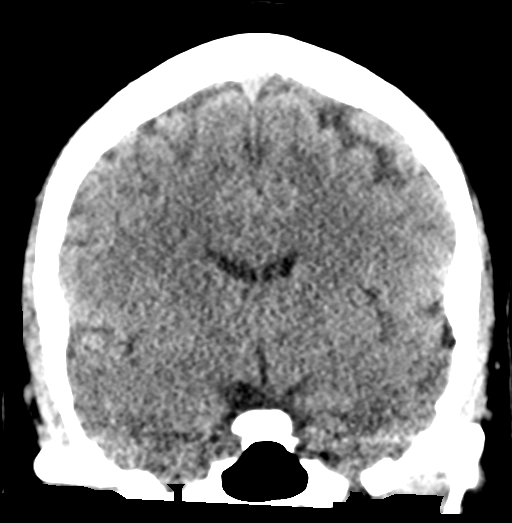

[Series 5: sagittal soft tissue · sagittal · 0.29mm/px · 3 of 49 slices shown]
[im 17/49  brain]
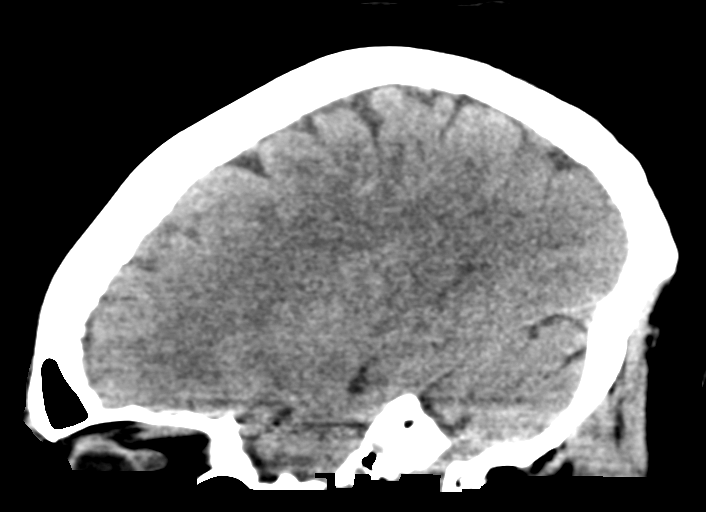
[im 25/49  brain]
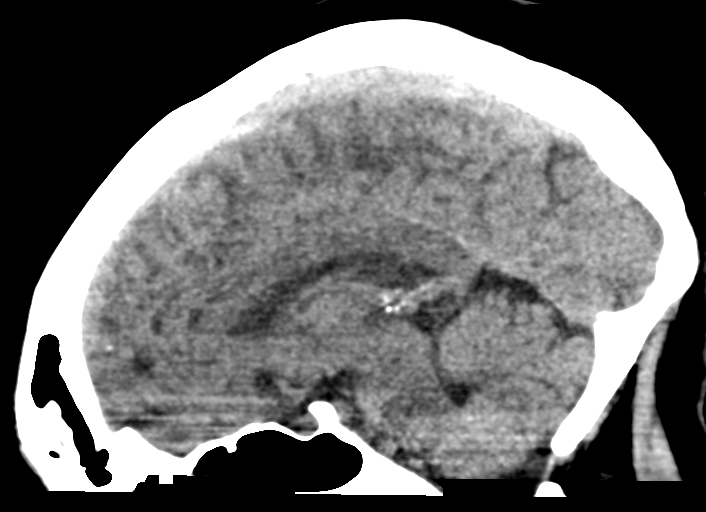
[im 33/49  brain]
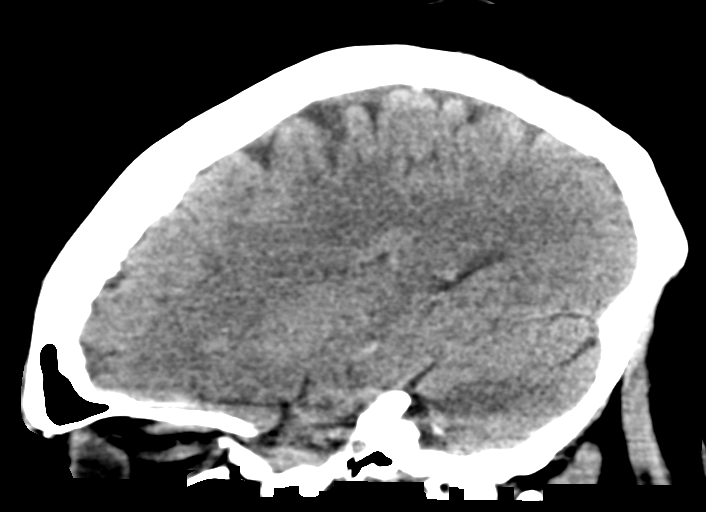

[15 of 47 positions shown; findings below may reference images not displayed]

FINDINGS: Brain: There is no mass, hemorrhage or extra-axial collection. The
size and configuration of the ventricles and extra-axial CSF spaces
are normal. There is no acute or chronic infarction. The brain
parenchyma is normal.

Vascular: No abnormal hyperdensity of the major intracranial
arteries or dural venous sinuses. No intracranial atherosclerosis.

Skull: The visualized skull base, calvarium and extracranial soft
tissues are normal.

Sinuses/Orbits: No fluid levels or advanced mucosal thickening of
the visualized paranasal sinuses. No mastoid or middle ear effusion.
The orbits are normal.
IMPRESSION: Normal brain.

## 2019-07-18 DIAGNOSIS — N1832 Chronic kidney disease, stage 3b: Secondary | ICD-10-CM | POA: Diagnosis not present

## 2019-07-18 DIAGNOSIS — N2581 Secondary hyperparathyroidism of renal origin: Secondary | ICD-10-CM | POA: Diagnosis not present

## 2019-07-18 DIAGNOSIS — I10 Essential (primary) hypertension: Secondary | ICD-10-CM | POA: Diagnosis not present

## 2019-07-18 DIAGNOSIS — D631 Anemia in chronic kidney disease: Secondary | ICD-10-CM | POA: Diagnosis not present

## 2019-07-18 DIAGNOSIS — R809 Proteinuria, unspecified: Secondary | ICD-10-CM | POA: Diagnosis not present

## 2019-07-20 DIAGNOSIS — M25562 Pain in left knee: Secondary | ICD-10-CM | POA: Diagnosis not present

## 2019-07-20 DIAGNOSIS — G8929 Other chronic pain: Secondary | ICD-10-CM | POA: Diagnosis not present

## 2019-07-20 DIAGNOSIS — E669 Obesity, unspecified: Secondary | ICD-10-CM | POA: Diagnosis not present

## 2019-07-20 DIAGNOSIS — Z96652 Presence of left artificial knee joint: Secondary | ICD-10-CM | POA: Diagnosis not present

## 2019-07-21 DIAGNOSIS — L4050 Arthropathic psoriasis, unspecified: Secondary | ICD-10-CM | POA: Diagnosis not present

## 2019-07-21 DIAGNOSIS — L409 Psoriasis, unspecified: Secondary | ICD-10-CM | POA: Diagnosis not present

## 2019-07-21 DIAGNOSIS — H04123 Dry eye syndrome of bilateral lacrimal glands: Secondary | ICD-10-CM | POA: Diagnosis not present

## 2019-07-21 DIAGNOSIS — Z79899 Other long term (current) drug therapy: Secondary | ICD-10-CM | POA: Diagnosis not present

## 2019-07-21 DIAGNOSIS — M8949 Other hypertrophic osteoarthropathy, multiple sites: Secondary | ICD-10-CM | POA: Diagnosis not present

## 2019-07-29 ENCOUNTER — Other Ambulatory Visit: Payer: Self-pay | Admitting: Student in an Organized Health Care Education/Training Program

## 2019-07-29 ENCOUNTER — Other Ambulatory Visit: Payer: Self-pay | Admitting: Family Medicine

## 2019-08-08 ENCOUNTER — Ambulatory Visit (HOSPITAL_BASED_OUTPATIENT_CLINIC_OR_DEPARTMENT_OTHER): Payer: Medicare Other | Admitting: Student in an Organized Health Care Education/Training Program

## 2019-08-08 ENCOUNTER — Encounter: Payer: Self-pay | Admitting: Student in an Organized Health Care Education/Training Program

## 2019-08-08 ENCOUNTER — Ambulatory Visit
Admission: RE | Admit: 2019-08-08 | Discharge: 2019-08-08 | Disposition: A | Discharge status: Home / Self Care | Payer: Medicare Other | Source: Ambulatory Visit | Attending: Student in an Organized Health Care Education/Training Program | Admitting: Student in an Organized Health Care Education/Training Program

## 2019-08-08 ENCOUNTER — Other Ambulatory Visit: Payer: Self-pay

## 2019-08-08 VITALS — BP 132/68 | HR 75 | Temp 97.6°F | Resp 22 | Ht 71.0 in | Wt 269.0 lb

## 2019-08-08 DIAGNOSIS — M25562 Pain in left knee: Secondary | ICD-10-CM | POA: Diagnosis not present

## 2019-08-08 DIAGNOSIS — M1612 Unilateral primary osteoarthritis, left hip: Secondary | ICD-10-CM | POA: Diagnosis not present

## 2019-08-08 DIAGNOSIS — M25552 Pain in left hip: Secondary | ICD-10-CM | POA: Diagnosis not present

## 2019-08-08 DIAGNOSIS — Z880 Allergy status to penicillin: Secondary | ICD-10-CM | POA: Insufficient documentation

## 2019-08-08 DIAGNOSIS — M47818 Spondylosis without myelopathy or radiculopathy, sacral and sacrococcygeal region: Secondary | ICD-10-CM

## 2019-08-08 DIAGNOSIS — Z881 Allergy status to other antibiotic agents status: Secondary | ICD-10-CM | POA: Insufficient documentation

## 2019-08-08 DIAGNOSIS — M7062 Trochanteric bursitis, left hip: Secondary | ICD-10-CM | POA: Insufficient documentation

## 2019-08-08 MED ORDER — LIDOCAINE HCL 2 % IJ SOLN
20.0000 mL | Freq: Once | INTRAMUSCULAR | Status: AC
  Administered 2019-08-08: 400 mg

## 2019-08-08 MED ORDER — IOHEXOL 180 MG/ML  SOLN
10.0000 mL | Freq: Once | INTRAMUSCULAR | Status: AC
  Administered 2019-08-08: 10 mL via INTRA_ARTICULAR

## 2019-08-08 MED ORDER — DEXAMETHASONE SODIUM PHOSPHATE 10 MG/ML IJ SOLN
10.0000 mg | Freq: Once | INTRAMUSCULAR | Status: AC
  Administered 2019-08-08: 10 mg

## 2019-08-08 MED ORDER — METHYLPREDNISOLONE ACETATE 40 MG/ML IJ SUSP
INTRAMUSCULAR | Status: AC
  Filled 2019-08-08: qty 1

## 2019-08-08 MED ORDER — ROPIVACAINE HCL 2 MG/ML IJ SOLN
INTRAMUSCULAR | Status: AC
  Filled 2019-08-08: qty 10

## 2019-08-08 MED ORDER — METHYLPREDNISOLONE ACETATE 40 MG/ML IJ SUSP
40.0000 mg | Freq: Once | INTRAMUSCULAR | Status: AC
  Administered 2019-08-08: 40 mg via INTRA_ARTICULAR

## 2019-08-08 MED ORDER — DEXAMETHASONE SODIUM PHOSPHATE 10 MG/ML IJ SOLN
INTRAMUSCULAR | Status: AC
  Filled 2019-08-08: qty 1

## 2019-08-08 MED ORDER — LIDOCAINE HCL 2 % IJ SOLN
INTRAMUSCULAR | Status: AC
  Filled 2019-08-08: qty 20

## 2019-08-08 MED ORDER — FENTANYL CITRATE (PF) 100 MCG/2ML IJ SOLN
25.0000 ug | INTRAMUSCULAR | Status: AC | PRN
  Administered 2019-08-08: 50 ug via INTRAVENOUS
  Administered 2019-08-08: 25 ug via INTRAVENOUS

## 2019-08-08 MED ORDER — FENTANYL CITRATE (PF) 100 MCG/2ML IJ SOLN
INTRAMUSCULAR | Status: AC
  Filled 2019-08-08: qty 2

## 2019-08-08 NOTE — Progress Notes (Signed)
Safety precautions to be maintained throughout the outpatient stay will include: orient to surroundings, keep bed in low position, maintain call bell within reach at all times, provide assistance with transfer out of bed and ambulation.  

## 2019-08-08 NOTE — Patient Instructions (Signed)

## 2019-08-08 NOTE — Progress Notes (Signed)
Patient's Name: Michele Moore  MRN: [id number]  Referring Provider: Juline Patch, MD  DOB: Jul 01, 1950  PCP: Juline Patch, MD  DOS: 08/08/2019  Note by: Gillis Santa, MD  Service setting: Ambulatory outpatient  Specialty: Interventional Pain Management  Patient type: Established  Location: [facility] (AMB) Pain Management Facility  Visit type: Interventional Procedure   Primary Reason for Visit: Interventional Pain Management Treatment. CC: Hip Pain (left ) and Knee Pain (left,  s/p fall about 2 weeks ago. )  Procedure:          Anesthesia, Analgesia, Anxiolysis:  Type: Therapeutic Sacroiliac Joint Steroid Injection #2  Region: Inferior Lumbosacral Region Level: PIIS (Posterior Inferior Iliac Spine) Laterality: Left-Side  Type: Moderate (Conscious) Sedation combined with Local Anesthesia Indication(s): Analgesia and Anxiety Route: Intravenous (IV) IV Access: Secured Sedation: Meaningful verbal contact was maintained at all times during the procedure  Local Anesthetic: Lidocaine 1-2%  Position: Prone           Indications: 1. SI joint arthritis (left)   2. Left hip pain   3. Primary osteoarthritis of left hip   4. SI joint arthritis    Pain Score: Pre-procedure: 8 /10 Post-procedure: 0-No pain/10   Pre-op Assessment:  Michele Moore is a 69 y.o. (year old), female patient, seen today for interventional treatment. She  has a past surgical history that includes Carpal tunnel release; Rotator cuff repair; Ankle surgery; Tubal ligation; Colonoscopy (2000?); Upper gi endoscopy (2000?); Tonsillectomy; Fusion of talonavicular joint (Right, 08/03/2015); Colonoscopy with propofol (N/A, 10/22/2017); Esophagogastroduodenoscopy (egd) with propofol (N/A, 10/22/2017); polypectomy (N/A, 10/22/2017); Joint replacement; and Total knee revision (Right, 01/20/2018). Michele Moore has a current medication list which includes the following prescription(s): albuterol, allopurinol, aspirin, betamethasone dipropionate, vitamin  d3, diclofenac sodium, ferrous sulfate, gabapentin, hydralazine, hydrocodone-acetaminophen, ketoconazole, levothyroxine, losartan, meclizine, methotrexate, multiple vitamins-calcium, omega-3 acid ethyl esters, omeprazole, polyethyl glycol-propyl glycol, potassium chloride sa, tizanidine, triamcinolone cream, triamterene-hydrochlorothiazide, [DISCONTINUED] fluticasone, and [DISCONTINUED] mometasone. Her primarily concern today is the Hip Pain (left ) and Knee Pain (left,  s/p fall about 2 weeks ago. )  Initial Vital Signs:  Pulse/HCG Rate: 77ECG Heart Rate: 68 Temp: (!) 97.5 F (36.4 C) Resp: 16 BP: (!) 125/101 SpO2: 95 %  BMI: Estimated body mass index is 37.52 kg/m as calculated from the following:   Height as of this encounter: 5\' 11"  (1.803 m).   Weight as of this encounter: 269 lb (122 kg).  Risk Assessment: Allergies: Reviewed. She is allergic to ampicillin, penicillins, vancomycin, and vibramycin [doxycycline calcium].  Allergy Precautions: None required Coagulopathies: Reviewed. None identified.  Blood-thinner therapy: None at this time Active Infection(s): Reviewed. None identified. Michele Moore is afebrile  Site Confirmation: Michele Moore was asked to confirm the procedure and laterality before marking the site Procedure checklist: Completed Consent: Before the procedure and under the influence of no sedative(s), amnesic(s), or anxiolytics, the patient was informed of the treatment options, risks and possible complications. To fulfill our ethical and legal obligations, as recommended by the American Medical Association's Code of Ethics, I have informed the patient of my clinical impression; the nature and purpose of the treatment or procedure; the risks, benefits, and possible complications of the intervention; the alternatives, including doing nothing; the risk(s) and benefit(s) of the alternative treatment(s) or procedure(s); and the risk(s) and benefit(s) of doing nothing. The patient  was provided information about the general risks and possible complications associated with the procedure. These may include, but are not limited to: failure to achieve  desired goals, infection, bleeding, organ or nerve damage, allergic reactions, paralysis, and death. In addition, the patient was informed of those risks and complications associated to the procedure, such as failure to decrease pain; infection; bleeding; organ or nerve damage with subsequent damage to sensory, motor, and/or autonomic systems, resulting in permanent pain, numbness, and/or weakness of one or several areas of the body; allergic reactions; (i.e.: anaphylactic reaction); and/or death. Furthermore, the patient was informed of those risks and complications associated with the medications. These include, but are not limited to: allergic reactions (i.e.: anaphylactic or anaphylactoid reaction(s)); adrenal axis suppression; blood sugar elevation that in diabetics may result in ketoacidosis or comma; water retention that in patients with history of congestive heart failure may result in shortness of breath, pulmonary edema, and decompensation with resultant heart failure; weight gain; swelling or edema; medication-induced neural toxicity; particulate matter embolism and blood vessel occlusion with resultant organ, and/or nervous system infarction; and/or aseptic necrosis of one or more joints. Finally, the patient was informed that Medicine is not an exact science; therefore, there is also the possibility of unforeseen or unpredictable risks and/or possible complications that may result in a catastrophic outcome. The patient indicated having understood very clearly. We have given the patient no guarantees and we have made no promises. Enough time was given to the patient to ask questions, all of which were answered to the patient's satisfaction. Ms. Moore has indicated that she wanted to continue with the procedure. Attestation: I, the  ordering provider, attest that I have discussed with the patient the benefits, risks, side-effects, alternatives, likelihood of achieving goals, and potential problems during recovery for the procedure that I have provided informed consent. Date  Time: 08/08/2019  9:47 AM  Pre-Procedure Preparation:  Monitoring: As per clinic protocol. Respiration, ETCO2, SpO2, BP, heart rate and rhythm monitor placed and checked for adequate function Safety Precautions: Patient was assessed for positional comfort and pressure points before starting the procedure. Time-out: I initiated and conducted the "Time-out" before starting the procedure, as per protocol. The patient was asked to participate by confirming the accuracy of the "Time Out" information. Verification of the correct person, site, and procedure were performed and confirmed by me, the nursing staff, and the patient. "Time-out" conducted as per Joint Commission's Universal Protocol (UP.01.01.01). Time: 1041  Description of Procedure:          Target Area: Inferior, posterior, aspect of the sacroiliac fissure Approach: Posterior, paraspinal, ipsilateral approach. Area Prepped: Entire Lower Lumbosacral Region Prepping solution: DuraPrep (Iodine Povacrylex [0.7% available iodine] and Isopropyl Alcohol, 74% w/w) Safety Precautions: Aspiration looking for blood return was conducted prior to all injections. At no point did we inject any substances, as a needle was being advanced. No attempts were made at seeking any paresthesias. Safe injection practices and needle disposal techniques used. Medications properly checked for expiration dates. SDV (single dose vial) medications used. Description of the Procedure: Protocol guidelines were followed. The patient was placed in position over the procedure table. The target area was identified and the area prepped in the usual manner. Skin & deeper tissues infiltrated with local anesthetic. Appropriate amount of time  allowed to pass for local anesthetics to take effect. The procedure needle was advanced under fluoroscopic guidance into the sacroiliac joint until a firm endpoint was obtained. Proper needle placement secured. Negative aspiration confirmed. Solution injected in intermittent fashion, asking for systemic symptoms every 0.5cc of injectate. The needles were then removed and the area cleansed, making sure to  leave some of the prepping solution back to take advantage of its long term bactericidal properties. Vitals:   08/08/19 1050 08/08/19 1100 08/08/19 1110 08/08/19 1122  BP: 132/79 134/67 132/68 132/68  Pulse: 82 75    Resp: 16 (!) 21 (!) 24 (!) 22  Temp:  (!) 97.5 F (36.4 C)  97.6 F (36.4 C)  TempSrc:      SpO2: 98% 98% 99% 99%  Weight:      Height:        Start Time: 1041 hrs. End Time: 1049 hrs. Materials:  Needle(s) Type: Spinal Needle Gauge: 22G Length: 3.5-in Medication(s): Please see orders for medications and dosing details. 5 cc solution made of 4 cc of 0.2% ropivacaine, 1 cc of methylprednisolone, 40 mg/cc.  2.5 cc injected intra-articular, 2.5 cc injected periarticular. Imaging Guidance (Non-Spinal):          Type of Imaging Technique: Fluoroscopy Guidance (Non-Spinal) Indication(s): Assistance in needle guidance and placement for procedures requiring needle placement in or near specific anatomical locations not easily accessible without such assistance. Exposure Time: Please see nurses notes. Contrast: Before injecting any contrast, we confirmed that the patient did not have an allergy to iodine, shellfish, or radiological contrast. Once satisfactory needle placement was completed at the desired level, radiological contrast was injected. Contrast injected under live fluoroscopy. No contrast complications. See chart for type and volume of contrast used. Fluoroscopic Guidance: I was personally present during the use of fluoroscopy. "Tunnel Vision Technique" used to obtain the  best possible view of the target area. Parallax error corrected before commencing the procedure. "Direction-depth-direction" technique used to introduce the needle under continuous pulsed fluoroscopy. Once target was reached, antero-posterior, oblique, and lateral fluoroscopic projection used confirm needle placement in all planes. Images permanently stored in EMR. Interpretation: I personally interpreted the imaging intraoperatively. Adequate needle placement confirmed in multiple planes. Appropriate spread of contrast into desired area was observed. No evidence of afferent or efferent intravascular uptake. Permanent images saved into the patient's record.  Antibiotic Prophylaxis:   Anti-infectives (From admission, onward)   None     Indication(s): None identified  Post-operative Assessment:  Post-procedure Vital Signs:  Pulse/HCG Rate: 7568 Temp: 97.6 F (36.4 C) Resp: (!) 22 BP: 132/68 SpO2: 99 %  EBL: None  Complications: No immediate post-treatment complications observed by team, or reported by patient.  Note: The patient tolerated the entire procedure well. A repeat set of vitals were taken after the procedure and the patient was kept under observation following institutional policy, for this type of procedure. Post-procedural neurological assessment was performed, showing return to baseline, prior to discharge. The patient was provided with post-procedure discharge instructions, including a section on how to identify potential problems. Should any problems arise concerning this procedure, the patient was given instructions to immediately contact us, at any time, without hesitation. In any case, we plan to contact the patient by telephone for a follow-up status report regarding this interventional procedure.  Comments:  No additional relevant information.  Plan of Care  Orders:  Orders Placed This Encounter  Procedures  . DG PAIN [facility] C-ARM 1-60 MIN NO REPORT     Intraoperative interpretation by procedural physician at [place].    Standing Status:   Standing    Number of Occurrences:   1    Order Specific Question:   Reason for exam:    Answer:   Assistance in needle guidance and placement for procedures requiring needle placement in or near specific  anatomical locations not easily accessible without such assistance.    Medications ordered for procedure: Meds ordered this encounter  Medications  . iohexol (OMNIPAQUE) 180 MG/ML injection 10 mL    Must be Myelogram-compatible. If not available, you may substitute with a water-soluble, non-ionic, hypoallergenic, myelogram-compatible radiological contrast medium.  Marland Kitchen lidocaine (XYLOCAINE) 2 % (with pres) injection 400 mg  . fentaNYL (SUBLIMAZE) injection 25-50 mcg    Make sure Narcan is available in the pyxis when using this medication. In the event of respiratory depression (RR< 8/min): Titrate NARCAN (naloxone) in increments of 0.1 to 0.2 mg IV at 2-3 minute intervals, until desired degree of reversal.  . methylPREDNISolone acetate (DEPO-MEDROL) injection 40 mg  . dexamethasone (DECADRON) injection 10 mg   Medications administered: We administered iohexol, lidocaine, fentaNYL, methylPREDNISolone acetate, and dexamethasone.  See the medical record for exact dosing, route, and time of administration.  Follow-up plan:   Return for Keep sch. appt.       Recent Visits Date Type Provider Dept  05/31/19 Office Visit Gillis Santa, MD [facility]  Showing recent visits within past 90 days and meeting all other requirements Today's Visits Date Type Provider Dept  08/08/19 Procedure visit Gillis Santa, MD [facility]  Showing today's visits and meeting all other requirements Future Appointments Date Type Provider Dept  08/30/19 Appointment Gillis Santa, MD [facility]  Showing future appointments within next 90 days and meeting all other  requirements  Disposition: Discharge home  Discharge Date & Time: 08/08/2019; 1130 hrs.   Primary Care Physician: Juline Patch, MD Location: [facility] Note by: Gillis Santa, MD Date: 08/08/2019; Time: 1:31 PM  Disclaimer:  Medicine is not an exact science. The only guarantee in medicine is that nothing is guaranteed. It is important to note that the decision to proceed with this intervention was based on the information collected from the patient. The Data and conclusions were drawn from the patient's questionnaire, the interview, and the physical examination. Because the information was provided in large part by the patient, it cannot be guaranteed that it has not been purposely or unconsciously manipulated. Every effort has been made to obtain as much relevant data as possible for this evaluation. It is important to note that the conclusions that lead to this procedure are derived in large part from the available data. Always take into account that the treatment will also be dependent on availability of resources and existing treatment guidelines, considered by other Pain Management Practitioners as being common knowledge and practice, at the time of the intervention. For Medico-Legal purposes, it is also important to point out that variation in procedural techniques and pharmacological choices are the acceptable norm. The indications, contraindications, technique, and results of the above procedure should only be interpreted and judged by a Board-Certified Interventional Pain Specialist with extensive familiarity and expertise in the same exact procedure and technique.

## 2019-08-08 NOTE — Progress Notes (Signed)
Patient's Name: Michele Moore  MRN: [id number]  Referring Provider: Juline Patch, MD  DOB: Jun 28, 1950  PCP: Juline Patch, MD  DOS: 08/08/2019  Note by: Gillis Santa, MD  Service setting: Ambulatory outpatient  Specialty: Interventional Pain Management  Patient type: Established  Location: [facility] (AMB) Pain Management Facility  Visit type: Interventional Procedure   Primary Reason for Visit: Interventional Pain Management Treatment. CC: Hip Pain (left ) and Knee Pain (left,  s/p fall about 2 weeks ago. )  Procedure:          Anesthesia, Analgesia, Anxiolysis:  Type: Trochanteric Bursa Injection #2  Primary Purpose: Therapeutic Region: Upper (proximal) Femoral Region Level: Hip Joint Target Area: Superior aspect of the hip joint cavity, going thru the superior portion of the capsular ligament. Approach: Posterolateral approach Laterality: Left  Type: Local Anesthesia with moderate sedation  Position: Prone   Indications: Left greater trochanteric bursitis  Pain Score: Pre-procedure: 8 /10 Post-procedure: 0-No pain/10   Pre-op Assessment:  Michele Moore is a 69 y.o. (year old), female patient, seen today for interventional treatment. She  has a past surgical history that includes Carpal tunnel release; Rotator cuff repair; Ankle surgery; Tubal ligation; Colonoscopy (2000?); Upper gi endoscopy (2000?); Tonsillectomy; Fusion of talonavicular joint (Right, 08/03/2015); Colonoscopy with propofol (N/A, 10/22/2017); Esophagogastroduodenoscopy (egd) with propofol (N/A, 10/22/2017); polypectomy (N/A, 10/22/2017); Joint replacement; and Total knee revision (Right, 01/20/2018). Michele Moore has a current medication list which includes the following prescription(s): albuterol, allopurinol, aspirin, betamethasone dipropionate, vitamin d3, diclofenac sodium, ferrous sulfate, gabapentin, hydralazine, hydrocodone-acetaminophen, ketoconazole, levothyroxine, losartan, meclizine, methotrexate, multiple vitamins-calcium,  omega-3 acid ethyl esters, omeprazole, polyethyl glycol-propyl glycol, potassium chloride sa, tizanidine, triamcinolone cream, triamterene-hydrochlorothiazide, [DISCONTINUED] fluticasone, and [DISCONTINUED] mometasone. Her primarily concern today is the Hip Pain (left ) and Knee Pain (left,  s/p fall about 2 weeks ago. )  Initial Vital Signs:  Pulse/HCG Rate: 77ECG Heart Rate: 68 Temp: (!) 97.5 F (36.4 C) Resp: 16 BP: (!) 125/101 SpO2: 95 %  BMI: Estimated body mass index is 37.52 kg/m as calculated from the following:   Height as of this encounter: 5\' 11"  (1.803 m).   Weight as of this encounter: 269 lb (122 kg).  Risk Assessment: Allergies: Reviewed. She is allergic to ampicillin, penicillins, vancomycin, and vibramycin [doxycycline calcium].  Allergy Precautions: None required Coagulopathies: Reviewed. None identified.  Blood-thinner therapy: None at this time Active Infection(s): Reviewed. None identified. Michele Moore is afebrile  Site Confirmation: Michele Moore was asked to confirm the procedure and laterality before marking the site Procedure checklist: Completed Consent: Before the procedure and under the influence of no sedative(s), amnesic(s), or anxiolytics, the patient was informed of the treatment options, risks and possible complications. To fulfill our ethical and legal obligations, as recommended by the American Medical Association's Code of Ethics, I have informed the patient of my clinical impression; the nature and purpose of the treatment or procedure; the risks, benefits, and possible complications of the intervention; the alternatives, including doing nothing; the risk(s) and benefit(s) of the alternative treatment(s) or procedure(s); and the risk(s) and benefit(s) of doing nothing. The patient was provided information about the general risks and possible complications associated with the procedure. These may include, but are not limited to: failure to achieve desired goals,  infection, bleeding, organ or nerve damage, allergic reactions, paralysis, and death. In addition, the patient was informed of those risks and complications associated to the procedure, such as failure to decrease pain; infection; bleeding; organ or nerve damage  with subsequent damage to sensory, motor, and/or autonomic systems, resulting in permanent pain, numbness, and/or weakness of one or several areas of the body; allergic reactions; (i.e.: anaphylactic reaction); and/or death. Furthermore, the patient was informed of those risks and complications associated with the medications. These include, but are not limited to: allergic reactions (i.e.: anaphylactic or anaphylactoid reaction(s)); adrenal axis suppression; blood sugar elevation that in diabetics may result in ketoacidosis or comma; water retention that in patients with history of congestive heart failure may result in shortness of breath, pulmonary edema, and decompensation with resultant heart failure; weight gain; swelling or edema; medication-induced neural toxicity; particulate matter embolism and blood vessel occlusion with resultant organ, and/or nervous system infarction; and/or aseptic necrosis of one or more joints. Finally, the patient was informed that Medicine is not an exact science; therefore, there is also the possibility of unforeseen or unpredictable risks and/or possible complications that may result in a catastrophic outcome. The patient indicated having understood very clearly. We have given the patient no guarantees and we have made no promises. Enough time was given to the patient to ask questions, all of which were answered to the patient's satisfaction. Michele Moore has indicated that she wanted to continue with the procedure. Attestation: I, the ordering provider, attest that I have discussed with the patient the benefits, risks, side-effects, alternatives, likelihood of achieving goals, and potential problems during recovery for  the procedure that I have provided informed consent. Date   Time: 08/08/2019  9:47 AM  Pre-Procedure Preparation:  Monitoring: As per clinic protocol. Respiration, ETCO2, SpO2, BP, heart rate and rhythm monitor placed and checked for adequate function Safety Precautions: Patient was assessed for positional comfort and pressure points before starting the procedure. Time-out: I initiated and conducted the "Time-out" before starting the procedure, as per protocol. The patient was asked to participate by confirming the accuracy of the "Time Out" information. Verification of the correct person, site, and procedure were performed and confirmed by me, the nursing staff, and the patient. "Time-out" conducted as per Joint Commission's Universal Protocol (UP.01.01.01). Time: 1041  Description of Procedure:          Area Prepped: Entire Posterolateral hip area. Prepping solution: DuraPrep (Iodine Povacrylex [0.7% available iodine] and Isopropyl Alcohol, 74% w/w) Safety Precautions: Aspiration looking for blood return was conducted prior to all injections. At no point did we inject any substances, as a needle was being advanced. No attempts were made at seeking any paresthesias. Safe injection practices and needle disposal techniques used. Medications properly checked for expiration dates. SDV (single dose vial) medications used. Description of the Procedure: Protocol guidelines were followed. The patient was placed in position over the procedure table. The target area was identified and the area prepped in the usual manner. Skin & deeper tissues infiltrated with local anesthetic. Appropriate amount of time allowed to pass for local anesthetics to take effect. The procedure needles were then advanced to the target area. Proper needle placement secured. Negative aspiration confirmed. Solution injected in intermittent fashion, asking for systemic symptoms every 0.5cc of injectate. The needles were then removed and the  area cleansed, making sure to leave some of the prepping solution back to take advantage of its long term bactericidal properties. Vitals:   08/08/19 1050 08/08/19 1100 08/08/19 1110 08/08/19 1122  BP: 132/79 134/67 132/68 132/68  Pulse: 82 75    Resp: 16 (!) 21 (!) 24 (!) 22  Temp:  (!) 97.5 F (36.4 C)  97.6 F (36.4 C)  TempSrc:      SpO2: 98% 98% 99% 99%  Weight:      Height:        Start Time: 1041 hrs. End Time: 1049 hrs.  Materials:  Needle(s) Type: Spinal Needle Gauge: 22G Length: 5.0-in Medication(s): Please see orders for medications and dosing details. 3 cc solution made of 2 cc of 0.2% ropivacaine, 1 cc of Decadron 10 mg/cc.  This was injected into the bursa. Imaging Guidance (Non-Spinal):          Type of Imaging Technique: Fluoroscopy Guidance (Non-Spinal) Indication(s): Assistance in needle guidance and placement for procedures requiring needle placement in or near specific anatomical locations not easily accessible without such assistance. Exposure Time: Please see nurses notes. Contrast: Before injecting any contrast, we confirmed that the patient did not have an allergy to iodine, shellfish, or radiological contrast. Once satisfactory needle placement was completed at the desired level, radiological contrast was injected. Contrast injected under live fluoroscopy. No contrast complications. See chart for type and volume of contrast used. Fluoroscopic Guidance: I was personally present during the use of fluoroscopy. "Tunnel Vision Technique" used to obtain the best possible view of the target area. Parallax error corrected before commencing the procedure. "Direction-depth-direction" technique used to introduce the needle under continuous pulsed fluoroscopy. Once target was reached, antero-posterior, oblique, and lateral fluoroscopic projection used confirm needle placement in all planes. Images permanently stored in EMR. Interpretation: I personally interpreted the  imaging intraoperatively. Adequate needle placement confirmed in multiple planes. Appropriate spread of contrast into desired area was observed. No evidence of afferent or efferent intravascular uptake. Permanent images saved into the patient's record.  Antibiotic Prophylaxis:   Anti-infectives (From admission, onward)   None     Indication(s): None identified  Post-operative Assessment:  Post-procedure Vital Signs:  Pulse/HCG Rate: 7568 Temp: 97.6 F (36.4 C) Resp: (!) 22 BP: 132/68 SpO2: 99 %  EBL: None  Complications: No immediate post-treatment complications observed by team, or reported by patient.  Note: The patient tolerated the entire procedure well. A repeat set of vitals were taken after the procedure and the patient was kept under observation following institutional policy, for this type of procedure. Post-procedural neurological assessment was performed, showing return to baseline, prior to discharge. The patient was provided with post-procedure discharge instructions, including a section on how to identify potential problems. Should any problems arise concerning this procedure, the patient was given instructions to immediately contact us, at any time, without hesitation. In any case, we plan to contact the patient by telephone for a follow-up status report regarding this interventional procedure.  Comments:  No additional relevant information.  Plan of Care  Orders:  Orders Placed This Encounter  Procedures   DG PAIN [facility] C-ARM 1-60 MIN NO REPORT    Intraoperative interpretation by procedural physician at [facility].    Standing Status:   Standing    Number of Occurrences:   1    Order Specific Question:   Reason for exam:    Answer:   Assistance in needle guidance and placement for procedures requiring needle placement in or near specific anatomical locations not easily accessible without such assistance.    Medications ordered for procedure: Meds  ordered this encounter  Medications   iohexol (OMNIPAQUE) 180 MG/ML injection 10 mL    Must be Myelogram-compatible. If not available, you may substitute with a water-soluble, non-ionic, hypoallergenic, myelogram-compatible radiological contrast medium.   lidocaine (XYLOCAINE) 2 % (with pres) injection 400 mg  fentaNYL (SUBLIMAZE) injection 25-50 mcg    Make sure Narcan is available in the pyxis when using this medication. In the event of respiratory depression (RR< 8/min): Titrate NARCAN (naloxone) in increments of 0.1 to 0.2 mg IV at 2-3 minute intervals, until desired degree of reversal.   methylPREDNISolone acetate (DEPO-MEDROL) injection 40 mg   dexamethasone (DECADRON) injection 10 mg   Medications administered: We administered iohexol, lidocaine, fentaNYL, methylPREDNISolone acetate, and dexamethasone.    Follow-up plan:   Return for Keep sch. appt.        Recent Visits Date Type Provider Dept  05/31/19 Office Visit Gillis Santa, MD [facility]  Showing recent visits within past 90 days and meeting all other requirements Today's Visits Date Type Provider Dept  08/08/19 Procedure visit Gillis Santa, MD [facility]  Showing today's visits and meeting all other requirements Future Appointments Date Type Provider Dept  08/30/19 Appointment Gillis Santa, MD [facility]  Showing future appointments within next 90 days and meeting all other requirements  Disposition: Discharge home  Discharge Date & Time: 08/08/2019; 1130 hrs.   Primary Care Physician: Juline Patch, MD Location: [facility] Note by: Gillis Santa, MD Date: 08/08/2019; Time: 1:29 PM  Disclaimer:  Medicine is not an exact science. The only guarantee in medicine is that nothing is guaranteed. It is important to note that the decision to proceed with this intervention was based on the information collected from the patient. The Data and  conclusions were drawn from the patient's questionnaire, the interview, and the physical examination. Because the information was provided in large part by the patient, it cannot be guaranteed that it has not been purposely or unconsciously manipulated. Every effort has been made to obtain as much relevant data as possible for this evaluation. It is important to note that the conclusions that lead to this procedure are derived in large part from the available data. Always take into account that the treatment will also be dependent on availability of resources and existing treatment guidelines, considered by other Pain Management Practitioners as being common knowledge and practice, at the time of the intervention. For Medico-Legal purposes, it is also important to point out that variation in procedural techniques and pharmacological choices are the acceptable norm. The indications, contraindications, technique, and results of the above procedure should only be interpreted and judged by a Board-Certified Interventional Pain Specialist with extensive familiarity and expertise in the same exact procedure and technique.

## 2019-08-09 ENCOUNTER — Telehealth: Payer: Self-pay

## 2019-08-09 NOTE — Telephone Encounter (Signed)
Post procedure phone call.  Patient states she is feeling just fine.

## 2019-08-15 ENCOUNTER — Other Ambulatory Visit: Payer: Self-pay | Admitting: Student in an Organized Health Care Education/Training Program

## 2019-08-16 ENCOUNTER — Other Ambulatory Visit: Payer: Self-pay | Admitting: Student in an Organized Health Care Education/Training Program

## 2019-08-24 ENCOUNTER — Encounter: Payer: Self-pay | Admitting: Student in an Organized Health Care Education/Training Program

## 2019-08-25 ENCOUNTER — Ambulatory Visit
Payer: Medicare Other | Attending: Student in an Organized Health Care Education/Training Program | Admitting: Student in an Organized Health Care Education/Training Program

## 2019-08-25 ENCOUNTER — Encounter: Payer: Self-pay | Admitting: Student in an Organized Health Care Education/Training Program

## 2019-08-25 ENCOUNTER — Other Ambulatory Visit: Payer: Self-pay

## 2019-08-25 DIAGNOSIS — M47818 Spondylosis without myelopathy or radiculopathy, sacral and sacrococcygeal region: Secondary | ICD-10-CM

## 2019-08-25 DIAGNOSIS — G894 Chronic pain syndrome: Secondary | ICD-10-CM

## 2019-08-25 DIAGNOSIS — M5136 Other intervertebral disc degeneration, lumbar region: Secondary | ICD-10-CM | POA: Diagnosis not present

## 2019-08-25 DIAGNOSIS — M1612 Unilateral primary osteoarthritis, left hip: Secondary | ICD-10-CM

## 2019-08-25 DIAGNOSIS — M47812 Spondylosis without myelopathy or radiculopathy, cervical region: Secondary | ICD-10-CM

## 2019-08-25 DIAGNOSIS — M47816 Spondylosis without myelopathy or radiculopathy, lumbar region: Secondary | ICD-10-CM

## 2019-08-25 MED ORDER — TIZANIDINE HCL 4 MG PO TABS: 4.0000 mg | ORAL_TABLET | Freq: Two times a day (BID) | ORAL | 5 refills | Status: DC | PRN

## 2019-08-25 MED ORDER — HYDROCODONE-ACETAMINOPHEN 7.5-325 MG PO TABS: 1.0000 | ORAL_TABLET | Freq: Three times a day (TID) | ORAL | 0 refills | Status: DC | PRN

## 2019-08-25 MED ORDER — GABAPENTIN 600 MG PO TABS: 600.0000 mg | ORAL_TABLET | Freq: Every day | ORAL | 5 refills | Status: DC

## 2019-08-25 NOTE — Progress Notes (Signed)
Patient: Michele Moore  Service Category: E/M  Provider: Gillis Santa, MD  DOB: 1950/12/22  DOS: 08/25/2019  Location: Office  MRN: [id number]  Setting: Ambulatory outpatient  Referring Provider: Juline Patch, MD  Type: Established Patient  Specialty: Interventional Pain Management  PCP: Juline Patch, MD  Location: Home  Delivery: TeleHealth     Virtual Encounter - Pain Management PROVIDER NOTE: Information contained herein reflects review and annotations entered in association with encounter. Interpretation of such information and data should be left to medically-trained personnel. Information provided to patient can be located elsewhere in the medical record under "Patient Instructions". Document created using STT-dictation technology, any transcriptional errors that may result from process are unintentional.    Contact & Pharmacy Preferred: [place]: [phone] (home) Mobile: [phone] (mobile) E-mail: No e-mail address on record  [facility], [place] - [address] [place] [address] Phone: [phone] Fax: [phone]   Pre-screening  Ms. Altadonna offered "in-person" vs "virtual" encounter. She indicated preferring virtual for this encounter.   Reason COVID-19*  Social distancing based on CDC and AMA recommendations.   I contacted [name] on 08/25/2019 via video conference.      I clearly identified myself as Gillis Santa, MD. I verified that I was speaking with the correct person using two identifiers (Name: Michele Moore, and date of birth: Apr 14, 1950).  Consent I sought verbal advanced consent from [name] for virtual visit interactions. I informed Ms. Kammerer of possible security and privacy concerns, risks, and limitations associated with providing "not-in-person" medical evaluation and management services. I also informed Ms. Boxwell of the availability of "in-person" appointments.  Finally, I informed her that there would be a charge for the virtual visit and that she could be  personally, fully or partially, financially responsible for it. Ms. Golladay expressed understanding and agreed to proceed.   Historic Elements   Michele Moore is a 69 y.o. year old, female patient evaluated today after her last contact with our practice on 08/16/2019. Ms. Fruchter  has a past medical history of Allergy, Anemia, Arthritis, Asthma, Cataract, Chronic kidney disease, Collagen vascular disease ([place]), Family history of adverse reaction to anesthesia, GERD (gastroesophageal reflux disease), Glaucoma, H/O tooth extraction, Hemorrhoid, History of hiatal hernia, Hypertension, Hypothyroidism, Migraines, Mixed hyperlipidemia, Multiple gastric ulcers, Thyroid disease, and Wears dentures. She also  has a past surgical history that includes Carpal tunnel release; Rotator cuff repair; Ankle surgery; Tubal ligation; Colonoscopy (2000?); Upper gi endoscopy (2000?); Tonsillectomy; Fusion of talonavicular joint (Right, 08/03/2015); Colonoscopy with propofol (N/A, 10/22/2017); Esophagogastroduodenoscopy (egd) with propofol (N/A, 10/22/2017); polypectomy (N/A, 10/22/2017); Joint replacement; and Total knee revision (Right, 01/20/2018). Ms. Wurtz has a current medication list which includes the following prescription(s): albuterol, allopurinol, aspirin, betamethasone dipropionate, vitamin d3, diclofenac sodium, ferrous sulfate, gabapentin, hydralazine, [START ON 08/29/2019] hydrocodone-acetaminophen, [START ON 09/28/2019] hydrocodone-acetaminophen, [START ON 10/28/2019] hydrocodone-acetaminophen, ketoconazole, levothyroxine, losartan, meclizine, methotrexate, multiple vitamins-calcium, omega-3 acid ethyl esters, omeprazole, polyethyl glycol-propyl glycol, potassium chloride sa, tizanidine, triamcinolone cream, triamterene-hydrochlorothiazide, [DISCONTINUED] fluticasone, and [DISCONTINUED] mometasone. She  reports that she quit smoking  about 26 years ago. She has never used smokeless tobacco. She reports that she does not drink alcohol and does not use drugs. Michele Moore is allergic to ampicillin, penicillins, vancomycin, and vibramycin [doxycycline calcium].   HPI  Today, she is being contacted for medication management.   No change in medical  history since last visit.  Patient's pain is at baseline.  Patient continues multimodal pain regimen as prescribed.  States that it provides pain relief and improvement in functional status. Received good benefit from LEFT -SI-J injection #2 on 08/08/19, endorses approx 75% pain relief for left SI joint pain   Post-Procedure Evaluation  Procedure:  LEFT -SI-J injection #2 on 08/08/19  Sedation: Please see nurses note.  Effectiveness during initial hour after procedure(Ultra-Short Term Relief): 100 %   Local anesthetic used: Long-acting (4-6 hours) Effectiveness: Defined as any analgesic benefit obtained secondary to the administration of local anesthetics. This carries significant diagnostic value as to the etiological location, or anatomical origin, of the pain. Duration of benefit is expected to coincide with the duration of the local anesthetic used.  Effectiveness during initial 4-6 hours after procedure(Short-Term Relief): 100 %   Long-term benefit: Defined as any relief past the pharmacologic duration of the local anesthetics.  Effectiveness past the initial 6 hours after procedure(Long-Term Relief): 100 % (pain came back approx 1 week ago. continues to hurt when she bends. states SI has done very well)  Current benefits: Defined as benefit that persist at this time.   Analgesia:  >50% relief Function: Somewhat improved ROM: Somewhat improved   Pharmacotherapy Assessment  Analgesic: 07/31/2019  1   05/31/2019  Hydrocodone-Acetamin 7.5-325  90.00  30 Bi Lat   [id number]   Wal (4612)   0  22.50 MME  Medicare   [place]     Monitoring: [place] PMP: PDMP reviewed during this encounter.        Pharmacotherapy: No side-effects or adverse reactions reported. Compliance: No problems identified. Effectiveness: Clinically acceptable. Plan: Refer to "POC".  UDS:  Summary  Date Value Ref Range Status  05/31/2019 Note  Final    Comment:    ==================================================================== [employer]ox[employer] Select 13 (MW) ==================================================================== Test                             Result       Flag       Units Drug Present and Declared for Prescription Verification   Hydrocodone                    305          EXPECTED   ng/mg creat   Hydromorphone                  72           EXPECTED   ng/mg creat   Norhydrocodone                 208          EXPECTED   ng/mg creat    Sources of hydrocodone include scheduled prescription medications.    Hydromorphone and norhydrocodone are expected metabolites of    hydrocodone. Hydromorphone is also available as a scheduled    prescription medication. ==================================================================== Test                      Result    Flag   Units      Ref Range   Creatinine              131              mg/dL      >=20 ==================================================================== Declared Medications:  The flagging and interpretation on this report are based  on the  following declared medications.  Unexpected results may arise from  inaccuracies in the declared medications.  **Note: The testing scope of this panel includes these medications:  Hydrocodone (Norco)  **Note: The testing scope of this panel does not include the  following reported medications:  Acetaminophen (Norco)  Albuterol  Allopurinol (Zyloprim)  Betamethasone  Calcium  Diclofenac (Voltaren)  Eye Drops  Gabapentin (Neurontin)  Hydralazine (Apresoline)  Hydrochlorothiazide (Maxzide)  Iron  Ketoconazole (Nizoral)  Levothyroxine (Synthroid)  Losartan (Cozaar)  Meclizine (Antivert)   Methotrexate  Mometasone (Nasonex)  Multivitamin  Omega-3 Fatty Acids  Omeprazole (Prilosec)  Potassium (Klor-Con)  Tizanidine (Zanaflex)  Triamcinolone (Kenalog)  Triamterene (Maxzide)  Vitamin D3 ==================================================================== For clinical consultation, please call [phone]. ====================================================================     Laboratory Chemistry Profile   Renal Lab Results  Component Value Date   BUN 31 (H) 04/12/2019   CREATININE 1.79 (H) 04/12/2019   BCR 17 04/12/2019   GFRAA 33 (L) 04/12/2019   GFRNONAA 28 (L) 04/12/2019     Hepatic Lab Results  Component Value Date   AST 25 12/02/2018   ALT 24 12/02/2018   ALBUMIN 4.3 04/12/2019   ALKPHOS 88 12/02/2018     Electrolytes Lab Results  Component Value Date   NA 145 (H) 04/12/2019   K 4.7 04/12/2019   CL 107 (H) 04/12/2019   CALCIUM 9.3 04/12/2019   MG 2.0 10/01/2018   PHOS 4.0 04/12/2019     Bone No results found for: VD25OH, VD125OH2TOT, DU2025KY7, CW2376EG3, 25OHVITD1, 25OHVITD2, 25OHVITD3, TESTOFREE, TESTOSTERONE   Inflammation (CRP: Acute Phase) (ESR: Chronic Phase) Lab Results  Component Value Date   CRP <0.8 11/05/2017   ESRSEDRATE 52 (H) 11/05/2017       Note: Above Lab results reviewed.  Assessment  The primary encounter diagnosis was SI joint arthritis (left). Diagnoses of Chronic pain syndrome, Primary osteoarthritis of left hip, Lumbar degenerative disc disease, Lumbar spondylosis, Cervical spondylosis without myelopathy, and Cervical facet syndrome were also pertinent to this visit.  Plan of Care   Ms. [name] has a current medication list which includes the following long-term medication(s): albuterol, allopurinol, ferrous sulfate, gabapentin, hydralazine, levothyroxine, losartan, omega-3 acid ethyl esters, omeprazole, potassium chloride sa, triamterene-hydrochlorothiazide, [DISCONTINUED] fluticasone, and [DISCONTINUED]  mometasone.  Pharmacotherapy (Medications Ordered): Meds ordered this encounter  Medications  . gabapentin (NEURONTIN) 600 MG tablet    Sig: Take 1 tablet (600 mg total) by mouth at bedtime.    Dispense:  30 tablet    Refill:  5  . tiZANidine (ZANAFLEX) 4 MG tablet    Sig: Take 1 tablet (4 mg total) by mouth 2 (two) times daily as needed for muscle spasms.    Dispense:  60 tablet    Refill:  5  . HYDROcodone-acetaminophen (NORCO) 7.5-325 MG tablet    Sig: Take 1 tablet by mouth every 8 (eight) hours as needed for severe pain. Must last 30 days.    Dispense:  90 tablet    Refill:  0    Chronic Pain. (STOP Act - Not applicable). Fill one day early if closed on scheduled refill date.  Marland Kitchen HYDROcodone-acetaminophen (NORCO) 7.5-325 MG tablet    Sig: Take 1 tablet by mouth every 8 (eight) hours as needed for severe pain. Must last 30 days.    Dispense:  90 tablet    Refill:  0    Chronic Pain. (STOP Act - Not applicable). Fill one day early if closed on scheduled refill date.  Marland Kitchen HYDROcodone-acetaminophen (  NORCO) 7.5-325 MG tablet    Sig: Take 1 tablet by mouth every 8 (eight) hours as needed for severe pain. Must last 30 days.    Dispense:  90 tablet    Refill:  0    Chronic Pain. (STOP Act - Not applicable). Fill one day early if closed on scheduled refill date.   Orders:  No orders of the defined types were placed in this encounter.  Follow-up plan:   Return in about 3 months (around 11/25/2019) for Medication Management, in person.   Recent Visits Date Type Provider Dept  08/08/19 Procedure visit Gillis Santa, MD [facility]  05/31/19 Office Visit Gillis Santa, MD [facility]  Showing recent visits within past 90 days and meeting all other requirements Today's Visits Date Type Provider Dept  08/25/19 Telemedicine Gillis Santa, MD [facility]  Showing today's visits and meeting all other requirements Future Appointments No visits were found  meeting these conditions. Showing future appointments within next 90 days and meeting all other requirements  I discussed the assessment and treatment plan with the patient. The patient was provided an opportunity to ask questions and all were answered. The patient agreed with the plan and demonstrated an understanding of the instructions.  Patient advised to call back or seek an in-person evaluation if the symptoms or condition worsens.  Duration of encounter: 37mnutes.  Note by: BGillis Santa MD Date: 08/25/2019; Time: 3:34 PM

## 2019-08-27 ENCOUNTER — Emergency Department: Payer: Medicare Other

## 2019-08-27 ENCOUNTER — Encounter: Payer: Self-pay | Admitting: Emergency Medicine

## 2019-08-27 ENCOUNTER — Other Ambulatory Visit: Payer: Self-pay

## 2019-08-27 DIAGNOSIS — Z79890 Hormone replacement therapy (postmenopausal): Secondary | ICD-10-CM | POA: Diagnosis not present

## 2019-08-27 DIAGNOSIS — J45909 Unspecified asthma, uncomplicated: Secondary | ICD-10-CM | POA: Diagnosis not present

## 2019-08-27 DIAGNOSIS — R52 Pain, unspecified: Secondary | ICD-10-CM | POA: Diagnosis not present

## 2019-08-27 DIAGNOSIS — Z87891 Personal history of nicotine dependence: Secondary | ICD-10-CM | POA: Insufficient documentation

## 2019-08-27 DIAGNOSIS — Z96653 Presence of artificial knee joint, bilateral: Secondary | ICD-10-CM | POA: Diagnosis not present

## 2019-08-27 DIAGNOSIS — Z79899 Other long term (current) drug therapy: Secondary | ICD-10-CM | POA: Insufficient documentation

## 2019-08-27 DIAGNOSIS — Y999 Unspecified external cause status: Secondary | ICD-10-CM | POA: Diagnosis not present

## 2019-08-27 DIAGNOSIS — W458XXA Other foreign body or object entering through skin, initial encounter: Secondary | ICD-10-CM | POA: Diagnosis not present

## 2019-08-27 DIAGNOSIS — N183 Chronic kidney disease, stage 3 unspecified: Secondary | ICD-10-CM | POA: Insufficient documentation

## 2019-08-27 DIAGNOSIS — E039 Hypothyroidism, unspecified: Secondary | ICD-10-CM | POA: Insufficient documentation

## 2019-08-27 DIAGNOSIS — T17920A Food in respiratory tract, part unspecified causing asphyxiation, initial encounter: Secondary | ICD-10-CM | POA: Diagnosis not present

## 2019-08-27 DIAGNOSIS — Y929 Unspecified place or not applicable: Secondary | ICD-10-CM | POA: Insufficient documentation

## 2019-08-27 DIAGNOSIS — T17208A Unspecified foreign body in pharynx causing other injury, initial encounter: Secondary | ICD-10-CM | POA: Insufficient documentation

## 2019-08-27 DIAGNOSIS — Z7951 Long term (current) use of inhaled steroids: Secondary | ICD-10-CM | POA: Insufficient documentation

## 2019-08-27 DIAGNOSIS — I129 Hypertensive chronic kidney disease with stage 1 through stage 4 chronic kidney disease, or unspecified chronic kidney disease: Secondary | ICD-10-CM | POA: Insufficient documentation

## 2019-08-27 DIAGNOSIS — Z20822 Contact with and (suspected) exposure to covid-19: Secondary | ICD-10-CM | POA: Insufficient documentation

## 2019-08-27 DIAGNOSIS — Y9389 Activity, other specified: Secondary | ICD-10-CM | POA: Insufficient documentation

## 2019-08-27 DIAGNOSIS — J387 Other diseases of larynx: Secondary | ICD-10-CM | POA: Diagnosis not present

## 2019-08-27 DIAGNOSIS — Z03818 Encounter for observation for suspected exposure to other biological agents ruled out: Secondary | ICD-10-CM | POA: Diagnosis not present

## 2019-08-27 DIAGNOSIS — I10 Essential (primary) hypertension: Secondary | ICD-10-CM | POA: Diagnosis not present

## 2019-08-27 NOTE — ED Triage Notes (Signed)
Pt report that she was eating chicken and swallowed part of a bone while eating. Per EMS, pt has good O2 97% and no stridor. Pt is able to handle secretions at this time and able to talk freely in complete sentences.

## 2019-08-28 ENCOUNTER — Emergency Department
Admission: EM | Admit: 2019-08-28 | Discharge: 2019-08-28 | Disposition: A | Discharge status: Home / Self Care | Payer: Medicare Other | Attending: Emergency Medicine | Admitting: Emergency Medicine

## 2019-08-28 DIAGNOSIS — T17208A Unspecified foreign body in pharynx causing other injury, initial encounter: Secondary | ICD-10-CM

## 2019-08-28 LAB — CBC WITH DIFFERENTIAL/PLATELET
Abs Immature Granulocytes: 0.02 10*3/uL (ref 0.00–0.07)
Basophils Absolute: 0 10*3/uL (ref 0.0–0.1)
Basophils Relative: 1 %
Eosinophils Absolute: 0.1 10*3/uL (ref 0.0–0.5)
Eosinophils Relative: 1 %
HCT: 35.5 % — ABNORMAL LOW (ref 36.0–46.0)
Hemoglobin: 11.6 g/dL — ABNORMAL LOW (ref 12.0–15.0)
Immature Granulocytes: 0 %
Lymphocytes Relative: 20 %
Lymphs Abs: 1.2 10*3/uL (ref 0.7–4.0)
MCH: 30.4 pg (ref 26.0–34.0)
MCHC: 32.7 g/dL (ref 30.0–36.0)
MCV: 92.9 fL (ref 80.0–100.0)
Monocytes Absolute: 0.6 10*3/uL (ref 0.1–1.0)
Monocytes Relative: 10 %
Neutro Abs: 4.1 10*3/uL (ref 1.7–7.7)
Neutrophils Relative %: 68 %
Platelets: 166 10*3/uL (ref 150–400)
RBC: 3.82 MIL/uL — ABNORMAL LOW (ref 3.87–5.11)
RDW: 15 % (ref 11.5–15.5)
WBC: 6 10*3/uL (ref 4.0–10.5)
nRBC: 0 % (ref 0.0–0.2)

## 2019-08-28 LAB — BASIC METABOLIC PANEL
Anion gap: 11 (ref 5–15)
BUN: 40 mg/dL — ABNORMAL HIGH (ref 8–23)
CO2: 25 mmol/L (ref 22–32)
Calcium: 9.9 mg/dL (ref 8.9–10.3)
Chloride: 106 mmol/L (ref 98–111)
Creatinine, Ser: 1.92 mg/dL — ABNORMAL HIGH (ref 0.44–1.00)
GFR calc Af Amer: 30 mL/min — ABNORMAL LOW (ref >60)
GFR calc non Af Amer: 26 mL/min — ABNORMAL LOW (ref >60)
Glucose, Bld: 108 mg/dL — ABNORMAL HIGH (ref 70–99)
Potassium: 3.9 mmol/L (ref 3.5–5.1)
Sodium: 142 mmol/L (ref 135–145)

## 2019-08-28 LAB — SARS CORONAVIRUS 2 BY RT PCR (HOSPITAL ORDER, PERFORMED IN ~~LOC~~ HOSPITAL LAB): SARS Coronavirus 2: NEGATIVE

## 2019-08-28 LAB — PROTIME-INR
INR: 1 (ref 0.8–1.2)
Prothrombin Time: 13 seconds (ref 11.4–15.2)

## 2019-08-28 MED ORDER — BENZOCAINE 20 % MT SOLN
Freq: Two times a day (BID) | OROMUCOSAL | Status: DC | PRN
  Administered 2019-08-28: 1 via OROMUCOSAL
  Filled 2019-08-28 (×2): qty 2

## 2019-08-28 MED ORDER — DEXAMETHASONE SODIUM PHOSPHATE 10 MG/ML IJ SOLN
10.0000 mg | Freq: Once | INTRAMUSCULAR | Status: AC
  Administered 2019-08-28: 10 mg via INTRAVENOUS
  Filled 2019-08-28: qty 1

## 2019-08-28 MED ORDER — SODIUM CHLORIDE 0.9 % IV BOLUS: 500.0000 mL | Freq: Once | INTRAVENOUS | Status: DC

## 2019-08-28 MED ORDER — ONDANSETRON HCL 4 MG/2ML IJ SOLN
4.0000 mg | INTRAMUSCULAR | Status: AC
  Administered 2019-08-28: 4 mg via INTRAVENOUS
  Filled 2019-08-28: qty 2

## 2019-08-28 MED ORDER — MORPHINE SULFATE (PF) 4 MG/ML IV SOLN
4.0000 mg | Freq: Once | INTRAVENOUS | Status: AC
  Administered 2019-08-28: 4 mg via INTRAVENOUS
  Filled 2019-08-28: qty 1

## 2019-08-28 NOTE — Consult Note (Signed)
Michele, Moore [id number] Dec 02, 1950 Michele Kehr, MD  Reason for Consult: Foreign body  HPI:  Michele Moore is a 69 y.o. female who presents with a choking sensation and lodged foreign body after eating a chicken breast this evening. She reports that a bone got stuck in her throat. She has significant discomfort and throat pain. She has dysphagia and has to spit her secretions. Mild dysphonia. No stridor. CT performed confirms calcified structure in oropharynx from left vallecula consistent with chicken bone.   Allergies:  Allergies  Allergen Reactions  . Ampicillin Swelling  . Penicillins Anaphylaxis and Swelling    Has patient had a PCN reaction causing immediate rash, facial/tongue/throat swelling, SOB or lightheadedness with hypotension: Yes Has patient had a PCN reaction causing severe rash involving mucus membranes or skin necrosis: No Has patient had a PCN reaction that required hospitalization: No Has patient had a PCN reaction occurring within the last 10 years: Yes If all of the above answers are "NO", then may proceed with Cephalosporin use.  . Vancomycin     Other reaction(s): Other (see comments)  . Vibramycin [Doxycycline Calcium] Rash    ROS: Review of systems normal other than 12 systems except per HPI.  PMH:  Past Medical History:  Diagnosis Date  . Allergy   . Anemia   . Arthritis   . Asthma   . Cataract   . Chronic kidney disease    STAGE 3 PER DR EASON 08/02/15  . Collagen vascular disease ([place])   . Family history of adverse reaction to anesthesia    sister difficult to put to sleep  . GERD (gastroesophageal reflux disease)   . Glaucoma   . H/O tooth extraction    all lower teeth 1/19  . Hemorrhoid   . History of hiatal hernia   . Hypertension   . Hypothyroidism   . Migraines   . Mixed hyperlipidemia   . Multiple gastric ulcers   . Thyroid disease   . Wears dentures    partial upper and lower    FH:  Family History  Problem Relation Age of Onset   . Heart failure Mother   . Gout Mother   . Arthritis Mother   . Hypertension Mother   . Stroke Mother   . Heart failure Father   . Diabetes Father   . Hyperlipidemia Father   . COPD Sister   . Cancer Maternal Aunt        breast  . Cancer Maternal Uncle        kidney  . Breast cancer Neg Hx     SH:  Social History   Socioeconomic History  . Marital status: Divorced    Spouse name: Not on file  . Number of children: 4  . Years of education: Not on file  . Highest education level: 9th grade  Occupational History  . Occupation: retired  Tobacco Use  . Smoking status: Former Smoker    Quit date: 02/24/1993    Years since quitting: 26.5  . Smokeless tobacco: Never Used  Vaping Use  . Vaping Use: Never used  Substance and Sexual Activity  . Alcohol use: No    Alcohol/week: 0.0 standard drinks  . Drug use: No  . Sexual activity: Not Currently  Other Topics Concern  . Not on file  Social History Narrative  . Not on file   Social Determinants of Health   Financial Resource Strain: Medium Risk  . Difficulty of Paying Living Expenses: Somewhat  hard  Food Insecurity: No Food Insecurity  . Worried About Charity fundraiser in the Last Year: Never true  . Ran Out of Food in the Last Year: Never true  Transportation Needs: No Transportation Needs  . Lack of Transportation (Medical): No  . Lack of Transportation (Non-Medical): No  Physical Activity: Inactive  . Days of Exercise per Week: 0 days  . Minutes of Exercise per Session: 0 min  Stress: Stress Concern Present  . Feeling of Stress : To some extent  Social Connections: Moderately Isolated  . Frequency of Communication with Friends and Family: More than three times a week  . Frequency of Social Gatherings with Friends and Family: More than three times a week  . Attends Religious Services: More than 4 times per year  . Active Member of Clubs or Organizations: No  . Attends Archivist Meetings: Never  .  Marital Status: Divorced  Human resources officer Violence: Not At Risk  . Fear of Current or Ex-Partner: No  . Emotionally Abused: No  . Physically Abused: No  . Sexually Abused: No    PSH:  Past Surgical History:  Procedure Laterality Date  . ANKLE SURGERY    . CARPAL TUNNEL RELEASE     x3  . COLONOSCOPY  2000?  . COLONOSCOPY WITH PROPOFOL N/A 10/22/2017   Procedure: COLONOSCOPY WITH PROPOFOL;  Surgeon: Lucilla Lame, MD;  Location: [facility];  Service: Endoscopy;  Laterality: N/A;  . ESOPHAGOGASTRODUODENOSCOPY (EGD) WITH PROPOFOL N/A 10/22/2017   Procedure: ESOPHAGOGASTRODUODENOSCOPY (EGD) WITH PROPOFOL;  Surgeon: Lucilla Lame, MD;  Location: [facility];  Service: Endoscopy;  Laterality: N/A;  . FUSION OF TALONAVICULAR JOINT Right 08/03/2015   Procedure: TAILOR NAVICULAR JOINT FUSION - RIGHT ;  Surgeon: [name], DPM;  Location: [facility];  Service: Podiatry;  Laterality: Right;  . JOINT REPLACEMENT     knee x 3 ,right x1 and left x2  . POLYPECTOMY N/A 10/22/2017   Procedure: POLYPECTOMY;  Surgeon: Lucilla Lame, MD;  Location: [facility];  Service: Endoscopy;  Laterality: N/A;  . ROTATOR CUFF REPAIR    . TONSILLECTOMY    . TOTAL KNEE REVISION Right 01/20/2018   Procedure: POLYETHYLENE EXCHANGE;  Surgeon: Dereck Leep, MD;  Location: [facility];  Service: Orthopedics;  Laterality: Right;  . TUBAL LIGATION    . UPPER GI ENDOSCOPY  2000?    Physical  Exam: BP (!) 189/105 (BP Location: Right Arm)   Pulse (!) 101   Temp 98.7 F (37.1 C) (Oral)   Resp 17   Ht 5\' 11"  (1.803 m)   Wt 122 kg   SpO2 96%   BMI 37.51 kg/m  Uncomforable though in no acute distress. Speaks in complete sentences. No stridor. Spits secretions. Able to lie with bed at 30 degrees.  CN 2-12 grossly intact and symmetric. Oral cavity, lips, gums.  Skin warm and dry.External nose and ears without masses or lesions. EOMI, PERRLA. Neck supple with no masses or lesions. No lymphadenopathy  palpated. Thyroid normal with no masses.  CT neck without contrast:  IMPRESSION: 1. Curvilinear calcified density extending from the left vallecula superiorly towards the right oropharynx, consistent with a retained foreign body/bone. 2. No other acute abnormality identified within the neck. No other radiopaque foreign body identified. 3. Aortic Atherosclerosis (ICD10-I70.0).  Procedures:  Flexible laryngoscopy:  Indication: foreign body, oropharynx The flexible laryngoscope was passed through the left side of the nose. The nasopharynx, oropharynx, hypopharynx, and larynx were evaluated.  The patient tolerated the procedure well.   - Nasal septal spur - Nasopharynx clear - Large chicken bone lodged in left vallecula protruding over the larynx.  - No laryngeal edema or airway obstruction. TVC mobile and normal in appearance.    Procedure Removal of oropharynx foreign body The patient was placed at 30 degrees in reclined position. Benzocaine spray was sprayed to anesthetize the oropharynx. Using the tongue blade, the tongue was depressed and the patient was asked to elevate the palate. The chicken bone was visualized in the oropharynx transorally with this maneuver after topical anesthesia. The Magill forceps were used to grasp the chicken bone which was removed atraumatically. The patient tolerated the procedure well.    A/P: 31F with retained chicken bone in left oropharynx which was successfully removed at the bedside.  - PO trial  - Recommend soft diet for the next 3-5 days - She will have discomfort in the throat given irritation from the previously lodge chicken bone which will improve over time.  - Follow-up as needed   Gaye Alken 08/28/2019 3:28 AM

## 2019-08-28 NOTE — ED Provider Notes (Signed)
[facility] Emergency Department Provider Note  ____________________________________________   First MD Initiated Contact with Patient 08/28/19 0136     (approximate)  I have reviewed the triage vital signs and the nursing notes.   HISTORY  Chief Complaint Choking  Level 5 caveat:  history/ROS limited by acute/critical illness  HPI Michele Moore is a 69 y.o. female with medical history as listed below who presents for evaluation of a choking and foreign body sensation in her neck.  She reports that she was eating some food that included chicken when she felt like she swallowed a bone.  She says that she feels like it is stuck going sideways on the back of her throat.  Nothing in particular makes it better or worse and the pain and discomfort is severe.  She cannot swallow and is having a bit of trouble speaking with a hoarse and abnormal voice.  She has to sit up straight and cannot lie down flat.  She has not had any vomiting and denies chest pain or shortness of breath.  She tried to drink a little bit of soda when she was triaged and was unable to do so.         Past Medical History:  Diagnosis Date  . Allergy   . Anemia   . Arthritis   . Asthma   . Cataract   . Chronic kidney disease    STAGE 3 PER DR EASON 08/02/15  . Collagen vascular disease ([place])   . Family history of adverse reaction to anesthesia    sister difficult to put to sleep  . GERD (gastroesophageal reflux disease)   . Glaucoma   . H/O tooth extraction    all lower teeth 1/19  . Hemorrhoid   . History of hiatal hernia   . Hypertension   . Hypothyroidism   . Migraines   . Mixed hyperlipidemia   . Multiple gastric ulcers   . Thyroid disease   . Wears dentures    partial upper and lower    Patient Active Problem List   Diagnosis Date Noted  . Left hip pain 08/08/2019  . Primary osteoarthritis of left hip 08/08/2019  . Non-seasonal allergic rhinitis 04/12/2019  . Benign  essential hypertension 03/06/2019  . Proteinuria 03/06/2019  . Trochanteric bursitis of left hip 10/04/2018  . SI joint arthritis (left) 10/04/2018  . Exertional chest pain 09/30/2018  . CKD (chronic kidney disease), stage III 09/30/2018  . Psoriatic arthritis ([place]) 04/23/2018  . Trigger ring finger of left hand 04/14/2018  . Female pelvic pain 01/20/2018  . History of revision of total knee arthroplasty 01/20/2018  . Iron deficiency anemia   . Heme + stool   . Acute gastritis without hemorrhage   . Gastric polyps   . Benign neoplasm of ascending colon   . Benign neoplasm of transverse colon   . Polyp of sigmoid colon   . Hypertension 10/01/2017  . Hypothyroidism 10/01/2017  . Gastroesophageal reflux disease 10/01/2017  . Hypokalemia 10/01/2017  . Mixed hyperlipidemia 10/01/2017  . Reactive airway disease 10/01/2017  . Bradycardia 08/24/2017  . Severe obesity (BMI 35.0-35.9 with comorbidity) ([place]) 08/18/2017  . Chronic gouty arthropathy without tophi 06/25/2017  . Encounter for long-term (current) use of high-risk medication 06/25/2017  . Positive ANA (antinuclear antibody) 06/17/2017  . Cervical spondylosis without myelopathy 06/16/2017  . Osteoarthritis of left shoulder due to rotator cuff injury 06/16/2017  . Sprain of anterior talofibular ligament of right ankle 06/16/2017  .  Lumbar radiculopathy 04/21/2017  . Lumbar degenerative disc disease 04/21/2017  . Chronic pain syndrome 04/21/2017  . Spinal stenosis, lumbar region, with neurogenic claudication 04/21/2017  . SS-A antibody positive 03/05/2017  . Pain, lower extremity 08/03/2015  . DOE (dyspnea on exertion) 12/28/2013    Past Surgical History:  Procedure Laterality Date  . ANKLE SURGERY    . CARPAL TUNNEL RELEASE     x3  . COLONOSCOPY  2000?  . COLONOSCOPY WITH PROPOFOL N/A 10/22/2017   Procedure: COLONOSCOPY WITH PROPOFOL;  Surgeon: Lucilla Lame, MD;  Location: [facility];  Service: Endoscopy;   Laterality: N/A;  . ESOPHAGOGASTRODUODENOSCOPY (EGD) WITH PROPOFOL N/A 10/22/2017   Procedure: ESOPHAGOGASTRODUODENOSCOPY (EGD) WITH PROPOFOL;  Surgeon: Lucilla Lame, MD;  Location: [facility];  Service: Endoscopy;  Laterality: N/A;  . FUSION OF TALONAVICULAR JOINT Right 08/03/2015   Procedure: TAILOR NAVICULAR JOINT FUSION - RIGHT ;  Surgeon: [name], DPM;  Location: [facility];  Service: Podiatry;  Laterality: Right;  . JOINT REPLACEMENT     knee x 3 ,right x1 and left x2  . POLYPECTOMY N/A 10/22/2017   Procedure: POLYPECTOMY;  Surgeon: Lucilla Lame, MD;  Location: [facility];  Service: Endoscopy;  Laterality: N/A;  . ROTATOR CUFF REPAIR    . TONSILLECTOMY    . TOTAL KNEE REVISION Right 01/20/2018   Procedure: POLYETHYLENE EXCHANGE;  Surgeon: Dereck Leep, MD;  Location: [facility];  Service: Orthopedics;  Laterality: Right;  . TUBAL LIGATION    . UPPER GI ENDOSCOPY  2000?    Prior to Admission medications   Medication Sig Start Date End Date Taking? Authorizing Provider  albuterol (VENTOLIN HFA) 108 (90 Base) MCG/ACT inhaler Inhale 2 puffs into the lungs every 4 (four) hours as needed for wheezing. 04/12/19   Juline Patch, MD  allopurinol (ZYLOPRIM) 100 MG tablet Take 200 mg by mouth daily.  08/06/17   [provider]  ASPIRIN 81 PO Take 81 mg by mouth.    [provider]  betamethasone dipropionate 0.05 % cream APPLY TO PSORIASIS ON HANDS TWICE DAILY ONLY. AVOID FACE, GROIN AND AXILLA 05/30/19   Ralene Bathe, MD  Cholecalciferol (VITAMIN D3) 2000 units TABS Take 2,000 Units by mouth daily.    [provider]  diclofenac sodium (VOLTAREN) 1 % GEL Apply 2 g topically 3 (three) times daily.  08/11/17   [provider]  ferrous sulfate 325 (65 FE) MG tablet Take 325 mg by mouth daily with breakfast.     [provider]  gabapentin (NEURONTIN) 600 MG tablet Take 1 tablet (600 mg total) by mouth at bedtime. 08/25/19 02/21/20   Gillis Santa, MD  hydrALAZINE (APRESOLINE) 50 MG tablet Take 1 tablet (50 mg total) by mouth 2 (two) times daily. 06/23/19   Juline Patch, MD  HYDROcodone-acetaminophen (NORCO) 7.5-325 MG tablet Take 1 tablet by mouth every 8 (eight) hours as needed for severe pain. Must last 30 days. 08/29/19 09/28/19  Gillis Santa, MD  HYDROcodone-acetaminophen (NORCO) 7.5-325 MG tablet Take 1 tablet by mouth every 8 (eight) hours as needed for severe pain. Must last 30 days. 09/28/19 10/28/19  Gillis Santa, MD  HYDROcodone-acetaminophen (NORCO) 7.5-325 MG tablet Take 1 tablet by mouth every 8 (eight) hours as needed for severe pain. Must last 30 days. 10/28/19 11/27/19  Gillis Santa, MD  ketoconazole (NIZORAL) 2 % cream APP AA ON THE FEET HS FOR FUNGAL INFECTION. 11/26/18   [provider]  levothyroxine (SYNTHROID) 200 MCG  tablet TAKE 1 TABLET(200 MCG) BY MOUTH DAILY BEFORE BREAKFAST 04/12/19   Juline Patch, MD  losartan (COZAAR) 25 MG tablet Take 1 tablet (25 mg total) by mouth daily. 04/12/19   Juline Patch, MD  meclizine (ANTIVERT) 12.5 MG tablet Take 1 tablet (12.5 mg total) by mouth 2 (two) times daily as needed for dizziness. 09/14/17   Juline Patch, MD  methotrexate (RHEUMATREX) 2.5 MG tablet Take 20 mg by mouth every Friday. Take 5 tablets by mouth once weekly 09/29/18   [provider]  Multiple Vitamins-Calcium (ONE-A-DAY WOMENS FORMULA PO) Take 1 tablet by mouth daily.    [provider]  omega-3 acid ethyl esters (LOVAZA) 1 g capsule Take 1 capsule (1 g total) by mouth 2 (two) times daily. 04/12/19   Juline Patch, MD  omeprazole (PRILOSEC) 40 MG capsule Take 1 capsule (40 mg total) by mouth daily. 04/12/19   Juline Patch, MD  Polyethyl Glycol-Propyl Glycol (SYSTANE OP) Place 1 drop into both eyes daily as needed (dry eyes).     [provider]  potassium chloride SA (KLOR-CON) 20 MEQ tablet Take 0.5 tablets (10 mEq total) by mouth daily. 04/12/19   Juline Patch,  MD  tiZANidine (ZANAFLEX) 4 MG tablet Take 1 tablet (4 mg total) by mouth 2 (two) times daily as needed for muscle spasms. 08/25/19   Gillis Santa, MD  triamcinolone cream (KENALOG) 0.1 % Apply 1 application topically every morning.    [provider]  triamterene-hydrochlorothiazide (MAXZIDE) 75-50 MG tablet Take 1 tablet by mouth daily. 04/18/19   Juline Patch, MD  fluticasone (FLONASE) 50 MCG/ACT nasal spray SHAKE LIQUID AND USE 1 SPRAY IN [facility] NOSTRIL DAILY 02/28/19 04/24/19  Juline Patch, MD  mometasone (NASONEX) 50 MCG/ACT nasal spray Place 2 sprays into the nose daily. 04/12/19 04/24/19  Juline Patch, MD    Allergies Ampicillin, Penicillins, Vancomycin, and Vibramycin [doxycycline calcium]  Family History  Problem Relation Age of Onset  . Heart failure Mother   . Gout Mother   . Arthritis Mother   . Hypertension Mother   . Stroke Mother   . Heart failure Father   . Diabetes Father   . Hyperlipidemia Father   . COPD Sister   . Cancer Maternal Aunt        breast  . Cancer Maternal Uncle        kidney  . Breast cancer Neg Hx     Social History Social History   Tobacco Use  . Smoking status: Former Smoker    Quit date: 02/24/1993    Years since quitting: 26.5  . Smokeless tobacco: Never Used  Vaping Use  . Vaping Use: Never used  Substance Use Topics  . Alcohol use: No    Alcohol/week: 0.0 standard drinks  . Drug use: No    Review of Systems Level 5 caveat:  history/ROS limited by acute/critical illness.  The patient has foreign body sensation in her throat. ____________________________________________   PHYSICAL EXAM:  VITAL SIGNS: ED Triage Vitals  Enc Vitals Group     BP 08/27/19 2204 (!) 158/77     Pulse Rate 08/27/19 2204 98     Resp 08/27/19 2204 18     Temp 08/27/19 2204 98.7 F (37.1 C)     Temp Source 08/27/19 2204 Oral     SpO2 08/27/19 2204 97 %     Weight 08/27/19 2206 122 kg (268 lb 15.4 oz)  Height 08/27/19 2206 1.803 m (5'  11")     Head Circumference --      Peak Flow --      Pain Score 08/27/19 2204 10     Pain Loc --      Pain Edu? --      Excl. in [place]? --     Constitutional: Alert and oriented.  Patient is in substantial distress. Eyes: Conjunctivae are normal.  Head: Atraumatic. Nose: No congestion/rhinnorhea. Mouth/Throat: Patient has a difficult time allowing even a basic oropharyngeal exam, but I cannot visualize a foreign body when she is able to open her mouth look.  She has substantial secretions in her mouth due to her difficulty swallowing and she is tripoding and spitting most of her secretions. Neck: No crepitus but tenderness with external palpation.  No stridor at this time but the patient's voice is hoarse. Cardiovascular: Normal rate, regular rhythm. Good peripheral circulation. Grossly normal heart sounds. Respiratory: Normal respiratory effort.  No retractions. Gastrointestinal: Soft and nontender. No distention.  Musculoskeletal: No lower extremity tenderness nor edema. No gross deformities of extremities. Neurologic: Hoarse voice though generally normal speech and language even though there was a degree of dysarthria due to the foreign body in her throat. No gross focal neurologic deficits are appreciated.  Skin:  Skin is warm, dry and intact.   ____________________________________________   LABS (all labs ordered are listed, but only abnormal results are displayed)  Labs Reviewed  CBC WITH DIFFERENTIAL/PLATELET - Abnormal; Notable for the following components:      Result Value   RBC 3.82 (*)    Hemoglobin 11.6 (*)    HCT 35.5 (*)    All other components within normal limits  BASIC METABOLIC PANEL - Abnormal; Notable for the following components:   Glucose, Bld 108 (*)    BUN 40 (*)    Creatinine, Ser 1.92 (*)    GFR calc non Af Amer 26 (*)    GFR calc Af Amer 30 (*)    All other components within normal limits  SARS CORONAVIRUS 2 BY RT PCR (HOSPITAL ORDER, [facility])  PROTIME-INR   ____________________________________________  EKG  None - EKG not ordered by ED physician ____________________________________________  RADIOLOGY Ursula Alert, personally viewed and evaluated these images (plain radiographs) as part of my medical decision making, as well as reviewing the written report by the radiologist.  ED MD interpretation: Bone stuck in the vallecula and extending towards the right oropharynx  Official radiology report(s): CT Soft Tissue Neck Wo Contrast  Result Date: 08/27/2019 CLINICAL DATA:  Initial evaluation for foreign body swallowed bone. EXAM: CT NECK WITHOUT CONTRAST TECHNIQUE: Multidetector CT imaging of the neck was performed following the standard protocol without intravenous contrast. COMPARISON:  None. FINDINGS: Pharynx and larynx: Oral cavity within normal limits without discrete mass or collection. No acute abnormality seen about the remaining dentition. Curvilinear calcified density seen extending from the left vallecula superiorly towards the right oropharynx seen, consistent with a retained foreign body/bone (series 2, images 51-35). Remainder of the oropharynx and nasopharynx within normal limits. Parapharyngeal fat maintained. No retropharyngeal collection. Epiglottis normal. Remainder of the hypopharynx and supraglottic larynx within normal limits. Glottis normal. Subglottic airway clear. No other radiopaque foreign body identified. Salivary glands: Mild diffuse fatty atrophy seen throughout the parotid and submandibular glands bilaterally. Salivary glands otherwise unremarkable. Thyroid: Thyroid grossly within normal limits, although evaluation mildly limited by motion. Lymph nodes: No pathologically enlarged lymph  nodes identified within the neck. Vascular: Moderate atherosclerotic change noted about the aortic arch. Evaluation for vascular patency limited by lack of IV contrast. Limited intracranial:  Unremarkable. Visualized orbits: Unremarkable. Mastoids and visualized paranasal sinuses: Visualized paranasal sinuses are clear. Visualized mastoids and middle ear cavities are well pneumatized and free of fluid. Skeleton: No acute osseous abnormality. No discrete or worrisome osseous lesions. Mild-to-moderate multilevel cervical spondylosis, most notable at C5-6. Upper chest: Visualized upper chest demonstrates no acute finding. Partially visualized lungs are grossly clear. Other: None. IMPRESSION: 1. Curvilinear calcified density extending from the left vallecula superiorly towards the right oropharynx, consistent with a retained foreign body/bone. 2. No other acute abnormality identified within the neck. No other radiopaque foreign body identified. 3. Aortic Atherosclerosis (ICD10-I70.0). Electronically Signed   By: Jeannine Boga M.D.   On: 08/27/2019 23:57    ____________________________________________   PROCEDURES   Procedure(s) performed (including Critical Care):  .1-3 Lead EKG Interpretation Performed by: [name], MD Authorized by: [name], MD     Interpretation: normal     ECG rate:  98   ECG rate assessment: normal     Rhythm: sinus rhythm     Ectopy: none     Conduction: normal       ____________________________________________   INITIAL IMPRESSION / MDM / ASSESSMENT AND PLAN / ED COURSE  As part of my medical decision making, I reviewed the following data within the [place] notes reviewed and incorporated, Labs reviewed , Old chart reviewed, A consult was requested and obtained from this/these consultant(s) ENT and Notes from prior ED visits   Differential diagnosis includes, but is not limited to, foreign body, tonsillitis, epiglottitis, tracheitis, perforation.  Given the position of the foreign body on the CT scan and the nature of the foreign body (a chicken bone), as well as the patient's clinical presentation of  severe distress, essentially tripoding, not tolerating secretions, voice change, etc., I feel the patient would be best evaluated by ENT for possible scope in a surgical theater.  I am concerned that if I were to try to evaluate more thoroughly and possibly try to remove the bone that I do not have the appropriate equipment (such as forceps at a 90 degree angle to allow for an appropriate approach angle) and that I may risk her airway by virtue of triggering a gag reflex and causing her to vomit.  Avoiding this would require paralysis and I do not think that is appropriate to do at bedside by me in the ED when she could be safely managed in the operating room by anesthesia and ENT.  I will call ENT to discuss the case and ask for their in person assessment.  The patient is on the cardiac monitor to evaluate for evidence of arrhythmia and/or significant heart rate changes.     Clinical Course as of Aug 27 452  Sun Aug 28, 2019  0201 Paging Dr. Earnestine Mealing with ENT to discuss   [CF]  0216 Spoke by phone with Dr. Earnestine Mealing.  He will come to the ED to evaluate the patient.  Getting a flexible laryngoscope available at his request.    [CF]  [place] Dr. Earnestine Mealing is at bedside.  We discussed the case in person.  He will assess   [CF]  0318 Dr. Earnestine Mealing was able to retrieve the chicken bone.  He said that he is not concerned about any trauma or possible perforation.  He said that the airway is  patent with no evidence of airway swelling, just some swelling in the oropharynx.  He recommended a soft diet until the patient is feeling better and so that she should be able to go home.  He agreed with my administration of Decadron 10 mg IV and did not feel she needs any additional steroids.  I have ordered a p.o. challenge and will observe her for short period of wound assuming that she is stable we should be able to discharge her.  Her creatinine is elevated but not substantially beyond her baseline and the rest of her labs are  within normal limits.   [CF]  [phone] Patient has been resting comfortably.  She is able to speak clearly and swallow her secretions.  She still has a foreign body sensation but the ENT doctor said that this is to be expected for the next few days.  She is comfortable with the plan for discharge and outpatient follow-up after she passes a p.o. challenge.   [CF]    Clinical Course User Index [CF] [name], MD     ____________________________________________  FINAL CLINICAL IMPRESSION(S) / ED DIAGNOSES  Final diagnoses:  Foreign body in pharynx, initial encounter     MEDICATIONS GIVEN DURING THIS VISIT:  Medications  sodium chloride 0.9 % bolus 500 mL (has no administration in time range)  benzocaine (HURRICAINE) 20 % mouth spray (1 application Mouth/Throat Given 08/28/19 0308)  morphine 4 MG/ML injection 4 mg (4 mg Intravenous Given 08/28/19 0211)  ondansetron (ZOFRAN) injection 4 mg (4 mg Intravenous Given 08/28/19 0211)  dexamethasone (DECADRON) injection 10 mg (10 mg Intravenous Given 08/28/19 0212)     ED Discharge Orders    None      *Please note:  Michele Moore was evaluated in Emergency Department on 08/28/2019 for the symptoms described in the history of present illness. She was evaluated in the context of the global COVID-19 pandemic, which necessitated consideration that the patient might be at risk for infection with the SARS-CoV-2 virus that causes COVID-19. Institutional protocols and algorithms that pertain to the evaluation of patients at risk for COVID-19 are in a state of rapid change based on information released by regulatory bodies including the CDC and federal and state organizations. These policies and algorithms were followed during the patient's care in the ED.  Some ED evaluations and interventions may be delayed as a result of limited staffing during and after the pandemic.*  Note:  This document was prepared using Dragon voice recognition software and may include  unintentional dictation errors.   [name], MD 08/28/19 [phone]

## 2019-08-28 NOTE — ED Notes (Signed)
Pt verbally consented to this RN for DC. Sign pad unavailable.

## 2019-08-28 NOTE — Discharge Instructions (Signed)
Now that the chicken bone is out, you can expect to still feel like something is in your throat for at least a few days.  Please follow-up with your regular doctor or follow-up with the ENT specialist in clinic if you need to do so, but you may want to give it at least a week to see if your symptoms go away on their own.  Return to the emergency department if you develop new or worsening symptoms that concern you.

## 2019-08-28 NOTE — ED Notes (Signed)
Pt given orange sherbert and ice water for PO trial.

## 2019-08-28 NOTE — ED Notes (Signed)
Pt comfortable in stretcher, HOB >30 degrees d/t pt needing to lean forward to spit after attempting to swallow. Pt states pain with swallowing and general discomfort. Pt placed on monitor, VSS.  New orders received from EDP.

## 2019-08-28 NOTE — ED Notes (Signed)
D/w Dr. Karma Greaser, pt's results of CT scan and concern that patient is spitting frequently into emesis bag and voice sounds similar to the "hot potato voice" that one would have when they have swollen tonsils. Sats WNL at this time.  Pt states she has been when she coughs. No respiratory distress noted at this time.

## 2019-08-30 ENCOUNTER — Other Ambulatory Visit: Payer: Self-pay | Admitting: Family Medicine

## 2019-08-30 ENCOUNTER — Telehealth: Payer: Medicare Other | Admitting: Student in an Organized Health Care Education/Training Program

## 2019-08-30 DIAGNOSIS — I10 Essential (primary) hypertension: Secondary | ICD-10-CM

## 2019-08-30 DIAGNOSIS — K219 Gastro-esophageal reflux disease without esophagitis: Secondary | ICD-10-CM

## 2019-08-30 MED ORDER — OMEPRAZOLE 40 MG PO CPDR: 40.0000 mg | DELAYED_RELEASE_CAPSULE | Freq: Every day | ORAL | 1 refills | Status: DC

## 2019-08-30 MED ORDER — HYDRALAZINE HCL 50 MG PO TABS: 50.0000 mg | ORAL_TABLET | Freq: Two times a day (BID) | ORAL | 0 refills | Status: DC

## 2019-08-30 NOTE — Telephone Encounter (Signed)
Requested Prescriptions  Pending Prescriptions Disp Refills   omeprazole (PRILOSEC) 40 MG capsule 90 capsule 1    Sig: Take 1 capsule (40 mg total) by mouth daily.     Gastroenterology: Proton Pump Inhibitors Passed - 08/30/2019  2:58 PM      Passed - Valid encounter within last 12 months    Recent Outpatient Visits          4 months ago Hypertension, unspecified type   [facility] Juline Patch, MD   5 months ago Acute recurrent frontal sinusitis   [facility] Juline Patch, MD   6 months ago Acute non-recurrent maxillary sinusitis   [facility] Juline Patch, MD   10 months ago Hospital discharge follow-up   [facility] Juline Patch, MD   11 months ago Chest pain, exertional   [facility] Juline Patch, MD              hydrALAZINE (APRESOLINE) 50 MG tablet 180 tablet 0    Sig: Take 1 tablet (50 mg total) by mouth 2 (two) times daily.     Cardiovascular:  Vasodilators Failed - 08/30/2019  2:58 PM      Failed - HCT in normal range and within 360 days    HCT  Date Value Ref Range Status  08/28/2019 35.5 (L) 36 - 46 % Final   Hematocrit  Date Value Ref Range Status  04/12/2019 33.9 (L) 34.0 - 46.6 % Final         Failed - HGB in normal range and within 360 days    Hemoglobin  Date Value Ref Range Status  08/28/2019 11.6 (L) 12.0 - 15.0 g/dL Final  04/12/2019 11.1 11.1 - 15.9 g/dL Final         Failed - RBC in normal range and within 360 days    RBC  Date Value Ref Range Status  08/28/2019 3.82 (L) 3.87 - 5.11 MIL/uL Final         Passed - WBC in normal range and within 360 days    WBC  Date Value Ref Range Status  08/28/2019 6.0 4.0 - 10.5 K/uL Final         Passed - PLT in normal range and within 360 days    Platelets  Date Value Ref Range Status  08/28/2019 166 150 - 400 K/uL Final  04/12/2019 162 150 - 450 x10E3/uL Final         Passed - Last BP in normal range    BP Readings from Last 1  Encounters:  08/28/19 120/69         Passed - Valid encounter within last 12 months    Recent Outpatient Visits          4 months ago Hypertension, unspecified type   [facility] Juline Patch, MD   5 months ago Acute recurrent frontal sinusitis   [facility] Juline Patch, MD   6 months ago Acute non-recurrent maxillary sinusitis   [facility] Juline Patch, MD   10 months ago Hospital discharge follow-up   [facility] Juline Patch, MD   11 months ago Chest pain, exertional   [facility] Juline Patch, MD

## 2019-08-30 NOTE — Telephone Encounter (Signed)
Returned call to patient to clarify which medications needed refills. Patients states she was told by the pharmacy that several faxes were sent regarding this refill. Patient states that she needs a refill of Hydralazine 50mg  tablet and Prilosec 40mg  capsule.

## 2019-08-30 NOTE — Telephone Encounter (Signed)
Medication Refill - Medication: Blood pressure medicine  Has the patient contacted their pharmacy? Yes.   Pharmacy hasn't received RX from PCP (Agent: If no, request that the patient contact the pharmacy for the refill.) (Agent: If yes, when and what did the pharmacy advise?)  Preferred Pharmacy (with phone number or street name): [facility]  Agent: Please be advised that RX refills may take up to 3 business days. We ask that you follow-up with your pharmacy.

## 2019-09-08 ENCOUNTER — Encounter: Payer: Self-pay | Admitting: Family Medicine

## 2019-09-08 ENCOUNTER — Ambulatory Visit (INDEPENDENT_AMBULATORY_CARE_PROVIDER_SITE_OTHER): Payer: Medicare Other | Admitting: Family Medicine

## 2019-09-08 ENCOUNTER — Other Ambulatory Visit: Payer: Self-pay

## 2019-09-08 VITALS — BP 142/74 | HR 66 | Ht 71.0 in | Wt 282.0 lb

## 2019-09-08 DIAGNOSIS — Z6839 Body mass index (BMI) 39.0-39.9, adult: Secondary | ICD-10-CM

## 2019-09-08 DIAGNOSIS — E876 Hypokalemia: Secondary | ICD-10-CM | POA: Diagnosis not present

## 2019-09-08 DIAGNOSIS — H8303 Labyrinthitis, bilateral: Secondary | ICD-10-CM | POA: Diagnosis not present

## 2019-09-08 DIAGNOSIS — J309 Allergic rhinitis, unspecified: Secondary | ICD-10-CM | POA: Diagnosis not present

## 2019-09-08 MED ORDER — POTASSIUM CHLORIDE CRYS ER 20 MEQ PO TBCR: 10.0000 meq | EXTENDED_RELEASE_TABLET | Freq: Every day | ORAL | 1 refills | Status: DC

## 2019-09-08 MED ORDER — MECLIZINE HCL 12.5 MG PO TABS: 12.5000 mg | ORAL_TABLET | Freq: Three times a day (TID) | ORAL | 0 refills | Status: DC | PRN

## 2019-09-08 NOTE — Progress Notes (Signed)
Date:  09/08/2019   Name:  Michele Moore   DOB:  1951/01/30   MRN:  [id number]   Chief Complaint: Dizziness (L) ear pain and R) ear ringing. Nose running)  Dizziness This is a new problem. The current episode started in the past 7 days. The problem occurs daily. The problem has been waxing and waning. Associated symptoms include congestion, headaches and vertigo. Pertinent negatives include no abdominal pain, anorexia, arthralgias, change in bowel habit, chest pain, chills, coughing, diaphoresis, fatigue, fever, joint swelling, myalgias, nausea, neck pain, numbness, rash, sore throat, swollen glands, urinary symptoms, visual change, vomiting or weakness. Nothing aggravates the symptoms. She has tried nothing for the symptoms. The treatment provided moderate relief.    Lab Results  Component Value Date   CREATININE 1.92 (H) 08/28/2019   BUN 40 (H) 08/28/2019   NA 142 08/28/2019   K 3.9 08/28/2019   CL 106 08/28/2019   CO2 25 08/28/2019   Lab Results  Component Value Date   CHOL 199 04/12/2019   HDL 84 04/12/2019   LDLCALC 102 (H) 04/12/2019   TRIG 69 04/12/2019   CHOLHDL 4.2 10/01/2018   Lab Results  Component Value Date   TSH 0.386 (L) 04/12/2019   Lab Results  Component Value Date   HGBA1C 5.4 10/01/2018   Lab Results  Component Value Date   WBC 6.0 08/28/2019   HGB 11.6 (L) 08/28/2019   HCT 35.5 (L) 08/28/2019   MCV 92.9 08/28/2019   PLT 166 08/28/2019   Lab Results  Component Value Date   ALT 24 12/02/2018   AST 25 12/02/2018   ALKPHOS 88 12/02/2018   BILITOT 0.3 12/02/2018     Review of Systems  Constitutional: Negative.  Negative for chills, diaphoresis, fatigue, fever and unexpected weight change.  HENT: Positive for congestion. Negative for ear discharge, ear pain, rhinorrhea, sinus pressure, sneezing and sore throat.   Eyes: Negative for photophobia, pain, discharge, redness and itching.  Respiratory: Negative for cough, shortness of breath, wheezing  and stridor.   Cardiovascular: Negative for chest pain.  Gastrointestinal: Negative for abdominal pain, anorexia, blood in stool, change in bowel habit, constipation, diarrhea, nausea and vomiting.  Endocrine: Negative for cold intolerance, heat intolerance, polydipsia, polyphagia and polyuria.  Genitourinary: Negative for dysuria, flank pain, frequency, hematuria, menstrual problem, pelvic pain, urgency, vaginal bleeding and vaginal discharge.  Musculoskeletal: Negative for arthralgias, back pain, joint swelling, myalgias and neck pain.  Skin: Negative for rash.  Allergic/Immunologic: Negative for environmental allergies and food allergies.  Neurological: Positive for dizziness, vertigo and headaches. Negative for weakness, light-headedness and numbness.  Hematological: Negative for adenopathy. Does not bruise/bleed easily.  Psychiatric/Behavioral: Negative for dysphoric mood. The patient is not nervous/anxious.     Patient Active Problem List   Diagnosis Date Noted  . Left hip pain 08/08/2019  . Primary osteoarthritis of left hip 08/08/2019  . Non-seasonal allergic rhinitis 04/12/2019  . Benign essential hypertension 03/06/2019  . Proteinuria 03/06/2019  . Trochanteric bursitis of left hip 10/04/2018  . SI joint arthritis (left) 10/04/2018  . Exertional chest pain 09/30/2018  . CKD (chronic kidney disease), stage III 09/30/2018  . Psoriatic arthritis ([place]) 04/23/2018  . Trigger ring finger of left hand 04/14/2018  . Female pelvic pain 01/20/2018  . History of revision of total knee arthroplasty 01/20/2018  . Iron deficiency anemia   . Heme + stool   . Acute gastritis without hemorrhage   . Gastric polyps   . Benign  neoplasm of ascending colon   . Benign neoplasm of transverse colon   . Polyp of sigmoid colon   . Hypertension 10/01/2017  . Hypothyroidism 10/01/2017  . Gastroesophageal reflux disease 10/01/2017  . Hypokalemia 10/01/2017  . Mixed hyperlipidemia 10/01/2017  .  Reactive airway disease 10/01/2017  . Bradycardia 08/24/2017  . Severe obesity (BMI 35.0-35.9 with comorbidity) ([place]) 08/18/2017  . Chronic gouty arthropathy without tophi 06/25/2017  . Encounter for long-term (current) use of high-risk medication 06/25/2017  . Positive ANA (antinuclear antibody) 06/17/2017  . Cervical spondylosis without myelopathy 06/16/2017  . Osteoarthritis of left shoulder due to rotator cuff injury 06/16/2017  . Sprain of anterior talofibular ligament of right ankle 06/16/2017  . Lumbar radiculopathy 04/21/2017  . Lumbar degenerative disc disease 04/21/2017  . Chronic pain syndrome 04/21/2017  . Spinal stenosis, lumbar region, with neurogenic claudication 04/21/2017  . SS-A antibody positive 03/05/2017  . Pain, lower extremity 08/03/2015  . DOE (dyspnea on exertion) 12/28/2013    Allergies  Allergen Reactions  . Ampicillin Swelling  . Penicillins Anaphylaxis and Swelling    Has patient had a PCN reaction causing immediate rash, facial/tongue/throat swelling, SOB or lightheadedness with hypotension: Yes Has patient had a PCN reaction causing severe rash involving mucus membranes or skin necrosis: No Has patient had a PCN reaction that required hospitalization: No Has patient had a PCN reaction occurring within the last 10 years: Yes If all of the above answers are "NO", then may proceed with Cephalosporin use.  . Vancomycin     Other reaction(s): Other (see comments)  . Vibramycin [Doxycycline Calcium] Rash    Past Surgical History:  Procedure Laterality Date  . ANKLE SURGERY    . CARPAL TUNNEL RELEASE     x3  . COLONOSCOPY  2000?  . COLONOSCOPY WITH PROPOFOL N/A 10/22/2017   Procedure: COLONOSCOPY WITH PROPOFOL;  Surgeon: Lucilla Lame, MD;  Location: [place];  Service: Endoscopy;  Laterality: N/A;  . ESOPHAGOGASTRODUODENOSCOPY (EGD) WITH PROPOFOL N/A 10/22/2017   Procedure: ESOPHAGOGASTRODUODENOSCOPY (EGD) WITH PROPOFOL;  Surgeon: Lucilla Lame, MD;  Location: [facility];  Service: Endoscopy;  Laterality: N/A;  . FUSION OF TALONAVICULAR JOINT Right 08/03/2015   Procedure: TAILOR NAVICULAR JOINT FUSION - RIGHT ;  Surgeon: [name], DPM;  Location: [facility];  Service: Podiatry;  Laterality: Right;  . JOINT REPLACEMENT     knee x 3 ,right x1 and left x2  . POLYPECTOMY N/A 10/22/2017   Procedure: POLYPECTOMY;  Surgeon: Lucilla Lame, MD;  Location: [facility];  Service: Endoscopy;  Laterality: N/A;  . ROTATOR CUFF REPAIR    . TONSILLECTOMY    . TOTAL KNEE REVISION Right 01/20/2018   Procedure: POLYETHYLENE EXCHANGE;  Surgeon: Dereck Leep, MD;  Location: [facility];  Service: Orthopedics;  Laterality: Right;  . TUBAL LIGATION    . UPPER GI ENDOSCOPY  2000?    Social History   Tobacco Use  . Smoking status: Former Smoker    Quit date: 02/24/1993    Years since quitting: 26.5  . Smokeless tobacco: Never Used  Vaping Use  . Vaping Use: Never used  Substance Use Topics  . Alcohol use: No    Alcohol/week: 0.0 standard drinks  . Drug use: No     Medication list has been reviewed and updated.  Current Meds  Medication Sig  . albuterol (VENTOLIN HFA) 108 (90 Base) MCG/ACT inhaler Inhale 2 puffs into the lungs every 4 (four) hours as needed for wheezing.  Marland Kitchen  allopurinol (ZYLOPRIM) 100 MG tablet Take 200 mg by mouth daily.   . ASPIRIN 81 PO Take 81 mg by mouth.  . betamethasone dipropionate 0.05 % cream APPLY TO PSORIASIS ON HANDS TWICE DAILY ONLY. AVOID FACE, GROIN AND AXILLA  . Cholecalciferol (VITAMIN D3) 2000 units TABS Take 2,000 Units by mouth daily.  . diclofenac sodium (VOLTAREN) 1 % GEL Apply 2 g topically 3 (three) times daily.   . ferrous sulfate 325 (65 FE) MG tablet Take 325 mg by mouth daily with breakfast.   . gabapentin (NEURONTIN) 600 MG tablet Take 1 tablet (600 mg total) by mouth at bedtime.  . hydrALAZINE (APRESOLINE) 50 MG tablet Take 1 tablet (50 mg total) by mouth 2 (two) times  daily.  Marland Kitchen HYDROcodone-acetaminophen (NORCO) 7.5-325 MG tablet Take 1 tablet by mouth every 8 (eight) hours as needed for severe pain. Must last 30 days.  Derrill Memo ON 09/28/2019] HYDROcodone-acetaminophen (NORCO) 7.5-325 MG tablet Take 1 tablet by mouth every 8 (eight) hours as needed for severe pain. Must last 30 days.  Derrill Memo ON 10/28/2019] HYDROcodone-acetaminophen (NORCO) 7.5-325 MG tablet Take 1 tablet by mouth every 8 (eight) hours as needed for severe pain. Must last 30 days.  Marland Kitchen ketoconazole (NIZORAL) 2 % cream APP AA ON THE FEET HS FOR FUNGAL INFECTION.  Marland Kitchen levothyroxine (SYNTHROID) 200 MCG tablet TAKE 1 TABLET(200 MCG) BY MOUTH DAILY BEFORE BREAKFAST  . losartan (COZAAR) 25 MG tablet Take 1 tablet (25 mg total) by mouth daily.  . meclizine (ANTIVERT) 12.5 MG tablet Take 1 tablet (12.5 mg total) by mouth 2 (two) times daily as needed for dizziness.  . methotrexate (RHEUMATREX) 2.5 MG tablet Take 20 mg by mouth every Friday. Take 5 tablets by mouth once weekly  . Multiple Vitamins-Calcium (ONE-A-DAY WOMENS FORMULA PO) Take 1 tablet by mouth daily.  Marland Kitchen omega-3 acid ethyl esters (LOVAZA) 1 g capsule Take 1 capsule (1 g total) by mouth 2 (two) times daily.  Marland Kitchen omeprazole (PRILOSEC) 40 MG capsule Take 1 capsule (40 mg total) by mouth daily.  Vladimir Faster Glycol-Propyl Glycol (SYSTANE OP) Place 1 drop into both eyes daily as needed (dry eyes).   . potassium chloride SA (KLOR-CON) 20 MEQ tablet Take 0.5 tablets (10 mEq total) by mouth daily.  Marland Kitchen tiZANidine (ZANAFLEX) 4 MG tablet Take 1 tablet (4 mg total) by mouth 2 (two) times daily as needed for muscle spasms.  Marland Kitchen triamcinolone cream (KENALOG) 0.1 % Apply 1 application topically every morning.  . triamterene-hydrochlorothiazide (MAXZIDE) 75-50 MG tablet Take 1 tablet by mouth daily.    PHQ 2/9 Scores 09/08/2019 04/25/2019 03/09/2019 02/04/2019  PHQ - 2 Score 0 0 0 0  PHQ- 9 Score 0 - - 0    GAD 7 : Generalized Anxiety Score 09/08/2019  Nervous,  Anxious, on Edge 0  Control/stop worrying 0  Worry too much - different things 0  Trouble relaxing 0  Restless 0  Easily annoyed or irritable 0  Afraid - awful might happen 0  Total GAD 7 Score 0    BP Readings from Last 3 Encounters:  09/08/19 (!) 142/74  08/28/19 120/69  08/08/19 132/68    Physical Exam Vitals and nursing note reviewed.  Constitutional:      Appearance: She is well-developed.  HENT:     Head: Normocephalic.     Right Ear: Tympanic membrane, ear canal and external ear normal.     Left Ear: Tympanic membrane, ear canal and external ear normal.  Nose: Nose normal.     Mouth/Throat:     Mouth: Mucous membranes are moist.  Eyes:     General: Lids are everted, no foreign bodies appreciated. No scleral icterus.       Left eye: No foreign body or hordeolum.     Conjunctiva/sclera: Conjunctivae normal.     Right eye: Right conjunctiva is not injected.     Left eye: Left conjunctiva is not injected.     Pupils: Pupils are equal, round, and reactive to light.  Neck:     Thyroid: No thyromegaly.     Vascular: No JVD.     Trachea: No tracheal deviation.  Cardiovascular:     Rate and Rhythm: Normal rate and regular rhythm.     Heart sounds: Normal heart sounds. No murmur heard.  No friction rub. No gallop.   Pulmonary:     Effort: Pulmonary effort is normal. No respiratory distress.     Breath sounds: Normal breath sounds. No wheezing, rhonchi or rales.  Abdominal:     General: Bowel sounds are normal.     Palpations: Abdomen is soft. There is no mass.     Tenderness: There is no abdominal tenderness. There is no guarding or rebound.  Musculoskeletal:        General: No tenderness. Normal range of motion.     Cervical back: Normal range of motion and neck supple.  Lymphadenopathy:     Cervical: No cervical adenopathy.  Skin:    General: Skin is warm.     Findings: No rash.  Neurological:     Mental Status: She is alert and oriented to person, place,  and time.     Cranial Nerves: Cranial nerves are intact. No cranial nerve deficit.     Sensory: Sensation is intact.     Motor: Motor function is intact.     Coordination: Coordination is intact.     Deep Tendon Reflexes: Reflexes normal.  Psychiatric:        Mood and Affect: Mood is not anxious or depressed.     Wt Readings from Last 3 Encounters:  09/08/19 282 lb (127.9 kg)  08/27/19 268 lb 15.4 oz (122 kg)  08/08/19 269 lb (122 kg)    BP (!) 142/74   Pulse 66 Comment: irregular  Ht 5\' 11"  (1.803 m)   Wt 282 lb (127.9 kg)   BMI 39.33 kg/m   Assessment and Plan: 1. Labyrinthitis of both ears Acute.  Episodic.  Relatively stable.  Will treat with meclizine 12.5 mg as needed every 8 hours.  2. Allergic sinusitis Acute.  Episodic.  Patient has been instructed to resume loratadine antihistamine on a daily basis.  3. Hypokalemia Chronic.  Controlled.  Stable.  Will refill potassium chloride 1/2 tablet equivalent of 10 mEq once a day. - potassium chloride SA (KLOR-CON) 20 MEQ tablet; Take 0.5 tablets (10 mEq total) by mouth daily.  Dispense: 90 tablet; Refill: 1  4. Adult BMI 39.0-39.9 kg/sq m Health risks of being over weight were discussed and patient was counseled on weight loss options and exercise.

## 2019-10-21 ENCOUNTER — Other Ambulatory Visit: Payer: Self-pay | Admitting: Family Medicine

## 2019-10-21 DIAGNOSIS — I10 Essential (primary) hypertension: Secondary | ICD-10-CM

## 2019-10-21 NOTE — Telephone Encounter (Signed)
Requested medication (s) are due for refill today: yes  Requested medication (s) are on the active medication list: yes  Last refill:  08/30/19  # 180   0 refills  Future visit scheduled: No  Notes to clinic: HCT, HGB, RBC low 08/28/19    Requested Prescriptions  Pending Prescriptions Disp Refills   hydrALAZINE (APRESOLINE) 50 MG tablet [Pharmacy Med Name: HYDRALAZINE  50MG  TABLETS(ORANGE)] 180 tablet 0    Sig: TAKE 1 TABLET(50 MG) BY MOUTH TWICE DAILY      Cardiovascular:  Vasodilators Failed - 10/21/2019 12:30 PM      Failed - HCT in normal range and within 360 days    HCT  Date Value Ref Range Status  08/28/2019 35.5 (L) 36 - 46 % Final   Hematocrit  Date Value Ref Range Status  04/12/2019 33.9 (L) 34.0 - 46.6 % Final          Failed - HGB in normal range and within 360 days    Hemoglobin  Date Value Ref Range Status  08/28/2019 11.6 (L) 12.0 - 15.0 g/dL Final  04/12/2019 11.1 11.1 - 15.9 g/dL Final          Failed - RBC in normal range and within 360 days    RBC  Date Value Ref Range Status  08/28/2019 3.82 (L) 3.87 - 5.11 MIL/uL Final          Failed - Last BP in normal range    BP Readings from Last 1 Encounters:  09/08/19 (!) 142/74          Passed - WBC in normal range and within 360 days    WBC  Date Value Ref Range Status  08/28/2019 6.0 4.0 - 10.5 K/uL Final          Passed - PLT in normal range and within 360 days    Platelets  Date Value Ref Range Status  08/28/2019 166 150 - 400 K/uL Final  04/12/2019 162 150 - 450 x10E3/uL Final          Passed - Valid encounter within last 12 months    Recent Outpatient Visits           1 month ago Labyrinthitis of both ears   [facility] Juline Patch, [facility]   6 months ago Hypertension, unspecified type   [facility] Juline Patch, MD   7 months ago Acute recurrent frontal sinusitis   [facility] Juline Patch, MD   8 months ago Acute non-recurrent maxillary  sinusitis   [facility] Juline Patch, MD   1 year ago Hospital discharge follow-up   [facility] Juline Patch, MD

## 2019-10-24 NOTE — Telephone Encounter (Signed)
KP

## 2019-10-29 ENCOUNTER — Other Ambulatory Visit: Payer: Self-pay | Admitting: Family Medicine

## 2019-10-29 DIAGNOSIS — E782 Mixed hyperlipidemia: Secondary | ICD-10-CM

## 2019-10-29 DIAGNOSIS — I10 Essential (primary) hypertension: Secondary | ICD-10-CM

## 2019-10-29 NOTE — Telephone Encounter (Signed)
Requested Prescriptions  Pending Prescriptions Disp Refills   hydrALAZINE (APRESOLINE) 50 MG tablet [Pharmacy Med Name: HYDRALAZINE  50MG  TABLETS(ORANGE)] 180 tablet     Sig: TAKE 1 TABLET(50 MG) BY MOUTH TWICE DAILY     Cardiovascular:  Vasodilators Failed - 10/29/2019 12:34 PM      Failed - HCT in normal range and within 360 days    HCT  Date Value Ref Range Status  08/28/2019 35.5 (L) 36 - 46 % Final   Hematocrit  Date Value Ref Range Status  04/12/2019 33.9 (L) 34.0 - 46.6 % Final         Failed - HGB in normal range and within 360 days    Hemoglobin  Date Value Ref Range Status  08/28/2019 11.6 (L) 12.0 - 15.0 g/dL Final  04/12/2019 11.1 11.1 - 15.9 g/dL Final         Failed - RBC in normal range and within 360 days    RBC  Date Value Ref Range Status  08/28/2019 3.82 (L) 3.87 - 5.11 MIL/uL Final         Failed - Last BP in normal range    BP Readings from Last 1 Encounters:  09/08/19 (!) 142/74         Passed - WBC in normal range and within 360 days    WBC  Date Value Ref Range Status  08/28/2019 6.0 4.0 - 10.5 K/uL Final         Passed - PLT in normal range and within 360 days    Platelets  Date Value Ref Range Status  08/28/2019 166 150 - 400 K/uL Final  04/12/2019 162 150 - 450 x10E3/uL Final         Passed - Valid encounter within last 12 months    Recent Outpatient Visits          1 month ago Labyrinthitis of both ears   [place], Deanna C, MD   6 months ago Hypertension, unspecified type   [facility] Juline Patch, MD   7 months ago Acute recurrent frontal sinusitis   [facility] Juline Patch, MD   8 months ago Acute non-recurrent maxillary sinusitis   [facility] Juline Patch, MD   1 year ago Hospital discharge follow-up   [facility] Juline Patch, MD              omega-3 acid ethyl esters (LOVAZA) 1 g capsule [Pharmacy Med Name: OMEGA-3-ACID 1GM CAPSULES (RX)] 180  capsule 1    Sig: TAKE 1 CAPSULE BY MOUTH TWICE DAILY     Endocrinology:  Nutritional Agents Passed - 10/29/2019 12:34 PM      Passed - Valid encounter within last 12 months    Recent Outpatient Visits          1 month ago Labyrinthitis of both ears   [facility] Juline Patch, MD   6 months ago Hypertension, unspecified type   [facility] Juline Patch, MD   7 months ago Acute recurrent frontal sinusitis   [facility] Juline Patch, MD   8 months ago Acute non-recurrent maxillary sinusitis   [facility] Juline Patch, MD   1 year ago Hospital discharge follow-up   [facility] Juline Patch, MD

## 2019-11-09 ENCOUNTER — Other Ambulatory Visit: Payer: Self-pay | Admitting: Family Medicine

## 2019-11-09 DIAGNOSIS — I10 Essential (primary) hypertension: Secondary | ICD-10-CM

## 2019-11-09 DIAGNOSIS — E039 Hypothyroidism, unspecified: Secondary | ICD-10-CM

## 2019-11-09 MED ORDER — LEVOTHYROXINE SODIUM 200 MCG PO TABS: ORAL_TABLET | ORAL | 0 refills | Status: DC

## 2019-11-09 NOTE — Telephone Encounter (Signed)
Pt request refill  levothyroxine (SYNTHROID) 200 MCG tablet  hydrALAZINE (APRESOLINE) 50 MG tablet  [id number] - [place], [place] [address] AT [address] Phone:  [phone]  Fax:  [phone]

## 2019-11-09 NOTE — Addendum Note (Signed)
Addended by: Elliot Cousin on: 11/09/2019 11:57 AM   Modules accepted: Orders

## 2019-11-22 ENCOUNTER — Other Ambulatory Visit: Payer: Self-pay | Admitting: Family Medicine

## 2019-11-22 DIAGNOSIS — I10 Essential (primary) hypertension: Secondary | ICD-10-CM

## 2019-11-23 DIAGNOSIS — Z79899 Other long term (current) drug therapy: Secondary | ICD-10-CM | POA: Diagnosis not present

## 2019-11-23 DIAGNOSIS — L409 Psoriasis, unspecified: Secondary | ICD-10-CM | POA: Diagnosis not present

## 2019-11-23 DIAGNOSIS — M8949 Other hypertrophic osteoarthropathy, multiple sites: Secondary | ICD-10-CM | POA: Diagnosis not present

## 2019-11-23 DIAGNOSIS — H04123 Dry eye syndrome of bilateral lacrimal glands: Secondary | ICD-10-CM | POA: Diagnosis not present

## 2019-11-23 DIAGNOSIS — M1A00X0 Idiopathic chronic gout, unspecified site, without tophus (tophi): Secondary | ICD-10-CM | POA: Diagnosis not present

## 2019-11-23 DIAGNOSIS — Z6835 Body mass index (BMI) 35.0-35.9, adult: Secondary | ICD-10-CM | POA: Diagnosis not present

## 2019-11-23 DIAGNOSIS — R768 Other specified abnormal immunological findings in serum: Secondary | ICD-10-CM | POA: Diagnosis not present

## 2019-11-23 DIAGNOSIS — L4050 Arthropathic psoriasis, unspecified: Secondary | ICD-10-CM | POA: Diagnosis not present

## 2019-11-24 ENCOUNTER — Encounter: Payer: Self-pay | Admitting: Student in an Organized Health Care Education/Training Program

## 2019-11-24 ENCOUNTER — Ambulatory Visit
Payer: Medicare Other | Attending: Student in an Organized Health Care Education/Training Program | Admitting: Student in an Organized Health Care Education/Training Program

## 2019-11-24 ENCOUNTER — Other Ambulatory Visit: Payer: Self-pay

## 2019-11-24 DIAGNOSIS — M47812 Spondylosis without myelopathy or radiculopathy, cervical region: Secondary | ICD-10-CM | POA: Diagnosis not present

## 2019-11-24 DIAGNOSIS — M1712 Unilateral primary osteoarthritis, left knee: Secondary | ICD-10-CM | POA: Insufficient documentation

## 2019-11-24 DIAGNOSIS — M47818 Spondylosis without myelopathy or radiculopathy, sacral and sacrococcygeal region: Secondary | ICD-10-CM

## 2019-11-24 DIAGNOSIS — M5136 Other intervertebral disc degeneration, lumbar region: Secondary | ICD-10-CM

## 2019-11-24 DIAGNOSIS — M25552 Pain in left hip: Secondary | ICD-10-CM

## 2019-11-24 DIAGNOSIS — G8929 Other chronic pain: Secondary | ICD-10-CM

## 2019-11-24 DIAGNOSIS — G894 Chronic pain syndrome: Secondary | ICD-10-CM | POA: Diagnosis not present

## 2019-11-24 DIAGNOSIS — M1612 Unilateral primary osteoarthritis, left hip: Secondary | ICD-10-CM

## 2019-11-24 DIAGNOSIS — M7062 Trochanteric bursitis, left hip: Secondary | ICD-10-CM

## 2019-11-24 DIAGNOSIS — M47816 Spondylosis without myelopathy or radiculopathy, lumbar region: Secondary | ICD-10-CM | POA: Diagnosis not present

## 2019-11-24 DIAGNOSIS — M25562 Pain in left knee: Secondary | ICD-10-CM

## 2019-11-24 MED ORDER — HYDROCODONE-ACETAMINOPHEN 7.5-325 MG PO TABS: 1.0000 | ORAL_TABLET | Freq: Three times a day (TID) | ORAL | 0 refills | Status: DC | PRN

## 2019-11-24 MED ORDER — GABAPENTIN 600 MG PO TABS: 600.0000 mg | ORAL_TABLET | Freq: Every day | ORAL | 5 refills | Status: DC

## 2019-11-24 NOTE — Progress Notes (Signed)
Patient: Michele Moore  Service Category: E/M  Provider: Gillis Santa, MD  DOB: Feb 28, 1950  DOS: 11/24/2019  Location: Office  MRN: [id number]  Setting: Ambulatory outpatient  Referring Provider: Juline Patch, MD  Type: Established Patient  Specialty: Interventional Pain Management  PCP: Juline Patch, MD  Location: Home  Delivery: TeleHealth     Virtual Encounter - Pain Management PROVIDER NOTE: Information contained herein reflects review and annotations entered in association with encounter. Interpretation of such information and data should be left to medically-trained personnel. Information provided to patient can be located elsewhere in the medical record under "Patient Instructions". Document created using STT-dictation technology, any transcriptional errors that may result from process are unintentional.    Contact & Pharmacy Preferred: [place]: [phone] (home) Mobile: [phone] (mobile) E-mail: No e-mail address on record  [facility], [place] [address] [place] [address] Phone: [phone] Fax: [phone]   Pre-screening  Michele Moore offered "in-person" vs "virtual" encounter. She indicated preferring virtual for this encounter.   Reason COVID-19*  Social distancing based on CDC and AMA recommendations.   I contacted Michele Moore on 11/24/2019 via video conference.      I clearly identified myself as Gillis Santa, MD. I verified that I was speaking with the correct person using two identifiers (Name: Michele Moore, and date of birth: 09-29-1950).  Consent I sought verbal advanced consent from Michele Moore for virtual visit interactions. I informed Michele Moore of possible security and privacy concerns, risks, and limitations associated with providing "not-in-person" medical evaluation and management services. I also informed Michele Moore of the availability of "in-person" appointments.  Finally, I informed her that there would be a charge for the virtual visit and that she could be  personally, fully or partially, financially responsible for it. Michele Moore expressed understanding and agreed to proceed.   Historic Elements   Michele Moore is a 69 y.o. year old, female patient evaluated today after our last contact on 08/16/2019. Michele Moore  has a past medical history of Allergy, Anemia, Arthritis, Asthma, Cataract, Chronic kidney disease, Collagen vascular disease ([place]), Family history of adverse reaction to anesthesia, GERD (gastroesophageal reflux disease), Glaucoma, H/O tooth extraction, Hemorrhoid, History of hiatal hernia, Hypertension, Hypothyroidism, Migraines, Mixed hyperlipidemia, Multiple gastric ulcers, Thyroid disease, and Wears dentures. She also  has a past surgical history that includes Carpal tunnel release; Rotator cuff repair; Ankle surgery; Tubal ligation; Colonoscopy (2000?); Upper gi endoscopy (2000?); Tonsillectomy; Fusion of talonavicular joint (Right, 08/03/2015); Colonoscopy with propofol (N/A, 10/22/2017); Esophagogastroduodenoscopy (egd) with propofol (N/A, 10/22/2017); polypectomy (N/A, 10/22/2017); Joint replacement; and Total knee revision (Right, 01/20/2018). Michele Moore has a current medication list which includes the following prescription(s): albuterol, allopurinol, aspirin, betamethasone dipropionate, vitamin d3, diclofenac sodium, ferrous sulfate, gabapentin, hydralazine, [START ON 11/28/2019] hydrocodone-acetaminophen, [START ON 12/28/2019] hydrocodone-acetaminophen, [START ON 01/27/2020] hydrocodone-acetaminophen, ketoconazole, levothyroxine, losartan, meclizine, methotrexate, multiple vitamins-calcium, omega-3 acid ethyl esters, omeprazole, polyethyl glycol-propyl glycol, potassium chloride sa, tizanidine, triamcinolone cream, triamterene-hydrochlorothiazide, [DISCONTINUED] fluticasone, and [DISCONTINUED] mometasone. She  reports that she quit smoking about 26 years ago.  She has never used smokeless tobacco. She reports that she does not drink alcohol and does not use drugs. Michele Moore is allergic to ampicillin, penicillins, vancomycin, and vibramycin [doxycycline calcium].   HPI  Today, she is being contacted for medication management.   Scheduled for left total knee arthroplasty with  Dr Marry Guan on 12/14/19 due to significant left knee osteoarthritis and limited range of motion (history of previous left knee surgery x 2).  She also has persistent left knee swelling Patient states that she did sustain a fall in August Also patient is also followed up with her primary care provider for new onset dizziness and ringing in her ears. Patient continues multimodal pain regimen as prescribed.  States that it provides pain relief and improvement in functional status.  Pharmacotherapy Assessment  Analgesic: 10/30/2019  1   08/25/2019  Hydrocodone-Acetamin 7.5-325  90.00  30 Bi Lat   [id number]   Wal (4612)   0/0  22.50 MME  Medicare   [place]   Monitoring: [place] PMP: PDMP reviewed during this encounter.       Pharmacotherapy: No side-effects or adverse reactions reported. Compliance: No problems identified. Effectiveness: Clinically acceptable. Plan: Refer to "POC".  UDS:  Summary  Date Value Ref Range Status  05/31/2019 Note  Final    Comment:    ==================================================================== [employer] Select 13 (MW) ==================================================================== Test                             Result       Flag       Units Drug Present and Declared for Prescription Verification   Hydrocodone                    305          EXPECTED   ng/mg creat   Hydromorphone                  72           EXPECTED   ng/mg creat   Norhydrocodone                 208          EXPECTED   ng/mg creat    Sources of hydrocodone include scheduled prescription medications.    Hydromorphone and norhydrocodone are expected metabolites of     hydrocodone. Hydromorphone is also available as a scheduled    prescription medication. ==================================================================== Test                      Result    Flag   Units      Ref Range   Creatinine              131              mg/dL      >=20 ==================================================================== Declared Medications:  The flagging and interpretation on this report are based on the  following declared medications.  Unexpected results may arise from  inaccuracies in the declared medications.  **Note: The testing scope of this panel includes these medications:  Hydrocodone (Norco)  **Note: The testing scope of this panel does not include the  following reported medications:  Acetaminophen (Norco)  Albuterol  Allopurinol (Zyloprim)  Betamethasone  Calcium  Diclofenac (Voltaren)  Eye Drops  Gabapentin (Neurontin)  Hydralazine (Apresoline)  Hydrochlorothiazide (Maxzide)  Iron  Ketoconazole (Nizoral)  Levothyroxine (Synthroid)  Losartan (Cozaar)  Meclizine (Antivert)  Methotrexate  Mometasone (Nasonex)  Multivitamin  Omega-3 Fatty Acids  Omeprazole (Prilosec)  Potassium (Klor-Con)  Tizanidine (Zanaflex)  Triamcinolone (Kenalog)  Triamterene (Maxzide)  Vitamin D3 ==================================================================== For clinical consultation, please call [phone]. ====================================================================     Laboratory Chemistry Profile   Renal  Lab Results  Component Value Date   BUN 40 (H) 08/28/2019   CREATININE 1.92 (H) 08/28/2019   BCR 17 04/12/2019   GFRAA 30 (L) 08/28/2019   GFRNONAA 26 (L) 08/28/2019     Hepatic Lab Results  Component Value Date   AST 25 12/02/2018   ALT 24 12/02/2018   ALBUMIN 4.3 04/12/2019   ALKPHOS 88 12/02/2018     Electrolytes Lab Results  Component Value Date   NA 142 08/28/2019   K 3.9 08/28/2019   CL 106 08/28/2019    CALCIUM 9.9 08/28/2019   MG 2.0 10/01/2018   PHOS 4.0 04/12/2019     Bone No results found for: VD25OH, VD125OH2TOT, [id number], MC9470JG2, 25OHVITD1, 25OHVITD2, 25OHVITD3, TESTOFREE, TESTOSTERONE   Inflammation (CRP: Acute Phase) (ESR: Chronic Phase) Lab Results  Component Value Date   CRP <0.8 11/05/2017   ESRSEDRATE 52 (H) 11/05/2017       Note: Above Lab results reviewed.  Imaging  CT Soft Tissue Neck Wo Contrast CLINICAL DATA:  Initial evaluation for foreign body swallowed bone.  EXAM: CT NECK WITHOUT CONTRAST  TECHNIQUE: Multidetector CT imaging of the neck was performed following the standard protocol without intravenous contrast.  COMPARISON:  None.  FINDINGS: Pharynx and larynx: Oral cavity within normal limits without discrete mass or collection. No acute abnormality seen about the remaining dentition. Curvilinear calcified density seen extending from the left vallecula superiorly towards the right oropharynx seen, consistent with a retained foreign body/bone (series 2, images 51-35). Remainder of the oropharynx and nasopharynx within normal limits. Parapharyngeal fat maintained. No retropharyngeal collection. Epiglottis normal. Remainder of the hypopharynx and supraglottic larynx within normal limits. Glottis normal. Subglottic airway clear. No other radiopaque foreign body identified.  Salivary glands: Mild diffuse fatty atrophy seen throughout the parotid and submandibular glands bilaterally. Salivary glands otherwise unremarkable.  Thyroid: Thyroid grossly within normal limits, although evaluation mildly limited by motion.  Lymph nodes: No pathologically enlarged lymph nodes identified within the neck.  Vascular: Moderate atherosclerotic change noted about the aortic arch. Evaluation for vascular patency limited by lack of IV contrast.  Limited intracranial: Unremarkable.  Visualized orbits: Unremarkable.  Mastoids and visualized paranasal  sinuses: Visualized paranasal sinuses are clear. Visualized mastoids and middle ear cavities are well pneumatized and free of fluid.  Skeleton: No acute osseous abnormality. No discrete or worrisome osseous lesions. Mild-to-moderate multilevel cervical spondylosis, most notable at C5-6.  Upper chest: Visualized upper chest demonstrates no acute finding. Partially visualized lungs are grossly clear.  Other: None.  IMPRESSION: 1. Curvilinear calcified density extending from the left vallecula superiorly towards the right oropharynx, consistent with a retained foreign body/bone. 2. No other acute abnormality identified within the neck. No other radiopaque foreign body identified. 3. Aortic Atherosclerosis (ICD10-I70.0).  Electronically Signed   By: Jeannine Boga M.D.   On: 08/27/2019 23:57  Assessment  The primary encounter diagnosis was Chronic pain of left knee. Diagnoses of Arthritis of left knee, Primary osteoarthritis of left hip, SI joint arthritis (left), Lumbar degenerative disc disease, Lumbar spondylosis, Left hip pain, Cervical facet syndrome, Trochanteric bursitis of left hip, and Chronic pain syndrome were also pertinent to this visit.  Plan of Care  [name] has a current medication list which includes the following long-term medication(s): albuterol, allopurinol, ferrous sulfate, gabapentin, hydralazine, [START ON 11/28/2019] hydrocodone-acetaminophen, [START ON 12/28/2019] hydrocodone-acetaminophen, [START ON 01/27/2020] hydrocodone-acetaminophen, levothyroxine, losartan, omega-3 acid ethyl esters, omeprazole, potassium chloride sa, triamterene-hydrochlorothiazide, [DISCONTINUED] fluticasone, and [DISCONTINUED] mometasone.  Pharmacotherapy (Medications Ordered):  Meds ordered this encounter  Medications  . HYDROcodone-acetaminophen (NORCO) 7.5-325 MG tablet    Sig: Take 1 tablet by mouth every 8 (eight) hours as needed for severe pain. Must last 30 days.     Dispense:  90 tablet    Refill:  0    Chronic Pain. (STOP Act - Not applicable). Fill one day early if closed on scheduled refill date.  Marland Kitchen HYDROcodone-acetaminophen (NORCO) 7.5-325 MG tablet    Sig: Take 1 tablet by mouth every 8 (eight) hours as needed for severe pain. Must last 30 days.    Dispense:  90 tablet    Refill:  0    Chronic Pain. (STOP Act - Not applicable). Fill one day early if closed on scheduled refill date.  Marland Kitchen HYDROcodone-acetaminophen (NORCO) 7.5-325 MG tablet    Sig: Take 1 tablet by mouth every 8 (eight) hours as needed for severe pain. Must last 30 days.    Dispense:  90 tablet    Refill:  0    Chronic Pain. (STOP Act - Not applicable). Fill one day early if closed on scheduled refill date.  . gabapentin (NEURONTIN) 600 MG tablet    Sig: Take 1 tablet (600 mg total) by mouth at bedtime.    Dispense:  30 tablet    Refill:  5   Follow-up plan:   Return in about 3 months (around 02/23/2020) for Medication Management, in person.   Recent Visits No visits were found meeting these conditions. Showing recent visits within past 90 days and meeting all other requirements Today's Visits Date Type Provider Dept  11/24/19 Telemedicine Gillis Santa, MD [facility]  Showing today's visits and meeting all other requirements Future Appointments No visits were found meeting these conditions. Showing future appointments within next 90 days and meeting all other requirements  I discussed the assessment and treatment plan with the patient. The patient was provided an opportunity to ask questions and all were answered. The patient agreed with the plan and demonstrated an understanding of the instructions.  Patient advised to call back or seek an in-person evaluation if the symptoms or condition worsens.  Duration of encounter: 30 minutes.  Note by: Gillis Santa, MD Date: 11/24/2019; Time: 10:48 AM

## 2019-11-27 NOTE — Discharge Instructions (Signed)
Instructions after Total Knee Replacement   Larena Ohnemus P. Paw Karstens, Jr., M.D.     Dept. of Orthopaedics & Sports Medicine  [facility]  [address]  [place], [place]  [address]  Phone: [phone]   Fax: [phone]    DIET: Drink plenty of non-alcoholic fluids. Resume your normal diet. Include foods high in fiber.  ACTIVITY:  You may use crutches or a walker with weight-bearing as tolerated, unless instructed otherwise. You may be weaned off of the walker or crutches by your Physical Therapist.  Do NOT place pillows under the knee. Anything placed under the knee could limit your ability to straighten the knee.   Continue doing gentle exercises. Exercising will reduce the pain and swelling, increase motion, and prevent muscle weakness.   Please continue to use the TED compression stockings for 6 weeks. You may remove the stockings at night, but should reapply them in the morning. Do not drive or operate any equipment until instructed.  WOUND CARE:  Continue to use the PolarCare or ice packs periodically to reduce pain and swelling. You may bathe or shower after the staples are removed at the first office visit following surgery.  MEDICATIONS: You may resume your regular medications. Please take the pain medication as prescribed on the medication. Do not take pain medication on an empty stomach. You have been given a prescription for a blood thinner (Lovenox or Coumadin). Please take the medication as instructed. (NOTE: After completing a 2 week course of Lovenox, take one Enteric-coated aspirin once a day. This along with elevation will help reduce the possibility of phlebitis in your operated leg.) Do not drive or drink alcoholic beverages when taking pain medications.  CALL THE OFFICE FOR: Temperature above 101 degrees Excessive bleeding or drainage on the dressing. Excessive swelling, coldness, or paleness of the toes. Persistent nausea and vomiting.  FOLLOW-UP:  You  should have an appointment to return to the office in 10-14 days after surgery. Arrangements have been made for continuation of Physical Therapy (either home therapy or outpatient therapy).   [facility] Department Directory         [email or web address]       [email or web address]          Cardiology  Appointments: [place] - [phone] [place] - [phone]  Endocrinology  Appointments: [place] - [phone] [place] - [phone]  Gastroenterology  Appointments: [place] - [phone] [place] - [phone]        General Surgery   Appointments: [place] - [phone]  Internal Medicine/Family Medicine  Appointments: [place] - [phone] [place] - [phone] [place] - [phone]  Metabolic and Weigh Loss Surgery  Appointments: [place] - [phone]        Neurology  Appointments: [place] - [phone] [place] - [phone]  Neurosurgery  Appointments: [place] - [phone]  Obstetrics & Gynecology  Appointments: [place] - [phone] [place] - [phone]        Pediatrics  Appointments: [place] - [phone] [place] - [phone]  Physiatry  Appointments: [place] -[phone]  Physical Therapy  Appointments: [place] - [phone] [place] - [phone]        Podiatry  Appointments: [place] - [phone] [place] - [phone]  Pulmonology  Appointments: [place] - [phone]  Rheumatology  Appointments: [place] - [phone]        [place] Location: [facility]  [address] [place], [place]  [address]  [place] Location: [facility] [address] [place], [place]  [address]  [place] Location: [facility] [address] [place],   [address]    

## 2019-11-30 ENCOUNTER — Telehealth: Payer: Self-pay | Admitting: Family Medicine

## 2019-11-30 NOTE — Telephone Encounter (Signed)
Medication Refill - Medication: triamterene- hydrochlorothiazide   Has the patient contacted their pharmacy? Yes.   (Agent: If no, request that the patient contact the pharmacy for the refill.) (Agent: If yes, when and what did the pharmacy advise?)  Preferred Pharmacy (with phone number or street name):  [facility] [place], [place] [address]  [place] [address]  Phone: [phone] Fax: [phone]  Hours: Not open 24 hours     Agent: Please be advised that RX refills may take up to 3 business days. We ask that you follow-up with your pharmacy.

## 2019-12-01 ENCOUNTER — Other Ambulatory Visit: Payer: Self-pay

## 2019-12-01 DIAGNOSIS — I10 Essential (primary) hypertension: Secondary | ICD-10-CM

## 2019-12-01 MED ORDER — TRIAMTERENE-HCTZ 75-50 MG PO TABS: 1.0000 | ORAL_TABLET | Freq: Every day | ORAL | 0 refills | Status: DC

## 2019-12-05 ENCOUNTER — Inpatient Hospital Stay: Admission: RE | Admit: 2019-12-05 | Payer: Medicare Other | Source: Ambulatory Visit

## 2019-12-07 ENCOUNTER — Encounter
Admission: RE | Admit: 2019-12-07 | Discharge: 2019-12-07 | Disposition: A | Discharge status: Home / Self Care | Payer: Medicare Other | Source: Ambulatory Visit | Attending: Orthopedic Surgery | Admitting: Orthopedic Surgery

## 2019-12-07 ENCOUNTER — Other Ambulatory Visit: Payer: Self-pay

## 2019-12-07 DIAGNOSIS — Z01818 Encounter for other preprocedural examination: Secondary | ICD-10-CM | POA: Diagnosis not present

## 2019-12-07 HISTORY — DX: Other complications of anesthesia, initial encounter: T88.59XA

## 2019-12-07 HISTORY — DX: Dyspnea, unspecified: R06.00

## 2019-12-07 HISTORY — DX: Cardiac arrhythmia, unspecified: I49.9

## 2019-12-07 HISTORY — DX: Personal history of urinary calculi: Z87.442

## 2019-12-07 LAB — SEDIMENTATION RATE: Sed Rate: 48 mm/hr — ABNORMAL HIGH (ref 0–30)

## 2019-12-07 LAB — URINALYSIS, ROUTINE W REFLEX MICROSCOPIC
Bilirubin Urine: NEGATIVE
Glucose, UA: NEGATIVE mg/dL
Hgb urine dipstick: NEGATIVE
Ketones, ur: NEGATIVE mg/dL
Nitrite: POSITIVE — AB
Protein, ur: NEGATIVE mg/dL
Specific Gravity, Urine: 1.012 (ref 1.005–1.030)
pH: 6 (ref 5.0–8.0)

## 2019-12-07 LAB — COMPREHENSIVE METABOLIC PANEL
ALT: 17 U/L (ref 0–44)
AST: 25 U/L (ref 15–41)
Albumin: 4.1 g/dL (ref 3.5–5.0)
Alkaline Phosphatase: 87 U/L (ref 38–126)
Anion gap: 11 (ref 5–15)
BUN: 30 mg/dL — ABNORMAL HIGH (ref 8–23)
CO2: 26 mmol/L (ref 22–32)
Calcium: 9.9 mg/dL (ref 8.9–10.3)
Chloride: 104 mmol/L (ref 98–111)
Creatinine, Ser: 1.71 mg/dL — ABNORMAL HIGH (ref 0.44–1.00)
GFR, Estimated: 30 mL/min — ABNORMAL LOW (ref >60)
Glucose, Bld: 91 mg/dL (ref 70–99)
Potassium: 4.2 mmol/L (ref 3.5–5.1)
Sodium: 141 mmol/L (ref 135–145)
Total Bilirubin: 0.7 mg/dL (ref 0.3–1.2)
Total Protein: 7.5 g/dL (ref 6.5–8.1)

## 2019-12-07 LAB — CBC
HCT: 34 % — ABNORMAL LOW (ref 36.0–46.0)
Hemoglobin: 11.3 g/dL — ABNORMAL LOW (ref 12.0–15.0)
MCH: 30.3 pg (ref 26.0–34.0)
MCHC: 33.2 g/dL (ref 30.0–36.0)
MCV: 91.2 fL (ref 80.0–100.0)
Platelets: 159 10*3/uL (ref 150–400)
RBC: 3.73 MIL/uL — ABNORMAL LOW (ref 3.87–5.11)
RDW: 13.7 % (ref 11.5–15.5)
WBC: 5.5 10*3/uL (ref 4.0–10.5)
nRBC: 0 % (ref 0.0–0.2)

## 2019-12-07 LAB — SURGICAL PCR SCREEN
MRSA, PCR: NEGATIVE
Staphylococcus aureus: NEGATIVE

## 2019-12-07 LAB — PROTIME-INR
INR: 1 (ref 0.8–1.2)
Prothrombin Time: 12.6 seconds (ref 11.4–15.2)

## 2019-12-07 LAB — TYPE AND SCREEN
ABO/RH(D): O POS
Antibody Screen: NEGATIVE

## 2019-12-07 LAB — APTT: aPTT: 36 seconds (ref 24–36)

## 2019-12-07 LAB — C-REACTIVE PROTEIN: CRP: 0.6 mg/dL (ref <1.0)

## 2019-12-07 NOTE — Progress Notes (Signed)
  [facility] Perioperative Services: Pre-Admission/Anesthesia Testing  Abnormal Lab Notification   Date: 12/07/19  Name: Michele Moore MRN:   [id number]  Re: Abnormal labs noted during PAT appointment   Provider(s) Notified: Dereck Leep, MD and Tamala Julian, PA-C Notification mode: Routed and/or faxed via [place] LAB VALUE(S): Lab Results  Component Value Date   COLORURINE YELLOW (A) 12/07/2019   APPEARANCEUR HAZY (A) 12/07/2019   LABSPEC 1.012 12/07/2019   PHURINE 6.0 12/07/2019   GLUCOSEU NEGATIVE 12/07/2019   HGBUR NEGATIVE 12/07/2019   BILIRUBINUR NEGATIVE 12/07/2019   KETONESUR NEGATIVE 12/07/2019   PROTEINUR NEGATIVE 12/07/2019   UROBILINOGEN 0.2 08/18/2017   NITRITE POSITIVE (A) 12/07/2019   LEUKOCYTESUR MODERATE (A) 12/07/2019   EPIU 0-5 12/07/2019   WBCU 11-20 12/07/2019   RBCU 0-5 12/07/2019   BACTERIA MANY (A) 12/07/2019    Notes: UA performed in PAT consistent with infection. Patient scheduled for LEFT TOTAL KNEE REVISION WITH POLYETHYLENE EXCHANGE ONLY. (Left Knee) on 12/14/2019.    Marland Kitchen No leukocytosis noted on CBC.  Marland Kitchen Renal function normal; Estimated Creatinine Clearance: 44.7 mL/min (A) (by C-G formula based on SCr of 1.71 mg/dL (H)).  . Urine C&S pending to assess for pathogenically significant growth.  Will forward UA, and subsequent C&S results, to attending surgeon and his APP for review and Tx as deemed appropriate. This is a Community education officer; no formal response is required.  Honor Loh, MSN, APRN, FNP-C, CEN [facility]  Peri-operative Services Nurse Practitioner Phone: [phone] 12/07/19 2:01 PM

## 2019-12-07 NOTE — Patient Instructions (Addendum)
Your procedure is scheduled on:12-14-19 [facility] Report to Day Surgery on the 2nd floor of the [place]. To find out your arrival time, please call [phone] between 1PM - 3PM on: 12-13-19 TUESDAY  REMEMBER: Instructions that are not followed completely may result in serious medical risk, up to and including death; or upon the discretion of your surgeon and anesthesiologist your surgery may need to be rescheduled.  Do not eat food after midnight the night before surgery.  No gum chewing, lozengers or hard candies.  You may however, drink CLEAR liquids up to 2 hours before you are scheduled to arrive for your surgery. Do not drink anything within 2 hours of your scheduled arrival time.  Clear liquids include: - water  - apple juice without pulp - gatorade (not RED) - black coffee or tea (Do NOT add milk or creamers to the coffee or tea) Do NOT drink anything that is not on this list.  In addition, your doctor has ordered for you to drink the provided  Ensure Pre-Surgery Clear Carbohydrate Drink  Drinking this carbohydrate drink up to two hours before surgery helps to reduce insulin resistance and improve patient outcomes. Please complete drinking 2 hours prior to scheduled arrival time.  TAKE THESE MEDICATIONS THE MORNING OF SURGERY WITH A SIP OF WATER: -ALLOPURINOL (ZYLOPRIM) -HYDRALAZINE (APRESOLINE) -SYNTHROID (LEVOTHYROXINE) -PRILOSEC (OMEPRAZOLE-take one the night before and one on the morning of surgery - helps to prevent nausea after surgery.)  Use inhalers on the day of surgery and bring to the [facility]  Follow recommendations from Cardiologist, Pulmonologist or PCP regarding stopping Aspirin, Coumadin, Plavix, Eliquis, Pradaxa, or Pletal-INSTRUCTED BY DR HOOTEN TO STOP ASPIRIN TODAY (12-07-19)-DR HOOTEN INSTRUCTED PATIENT TO STOP METHOTREXATE NOW  One week prior to surgery: Stop  Anti-inflammatories (NSAIDS) such as Advil, Aleve, Ibuprofen, Motrin, Naproxen, Naprosyn and Aspirin based products such as Excedrin, Goodys Powder, BC Powder-HYDROCODONE/TYLENOL OK TO TAKE IF NEEDED   Stop ANY OVER THE COUNTER supplements until after surgery-STOP OMEGA 3 NOW-YOU MAY RESUME AFTER SURGERY-(You may continue taking  multivitamin.)  No Alcohol for 24 hours before or after surgery.  No Smoking including e-cigarettes for 24 hours prior to surgery.  No chewable tobacco products for at least 6 hours prior to surgery.  No nicotine patches on the day of surgery.  Do not use any "recreational" drugs for at least a week prior to your surgery.  Please be advised that the combination of cocaine and anesthesia may have negative outcomes, up to and including death. If you test positive for cocaine, your surgery will be cancelled.  On the morning of surgery brush your teeth with toothpaste and water, you may rinse your mouth with mouthwash if you wish. Do not swallow any toothpaste or mouthwash.  Do not wear jewelry, make-up, hairpins, clips or nail polish.  Do not wear lotions, powders, or perfumes.   Do not shave 48 hours prior to surgery.   Contact lenses, hearing aids and dentures may not be worn into surgery.  Do not bring valuables to the hospital. [facility] is not responsible for any missing/lost belongings or valuables.   Use CHG Soap as directed on instruction sheet.  Notify your doctor if there is any change in your medical condition (cold, fever, infection).  Wear comfortable clothing (specific to your surgery type) to the hospital.  Plan for stool softeners for home use; pain medications have a tendency to  cause constipation. You can also help prevent constipation by eating foods high in fiber such as fruits and vegetables and drinking plenty of fluids as your diet allows.  After surgery, you can help prevent lung complications by doing breathing exercises.  Take  deep breaths and cough every 1-2 hours. Your doctor may order a device called an Incentive Spirometer to help you take deep breaths. When coughing or sneezing, hold a pillow firmly against your incision with both hands. This is called "splinting." Doing this helps protect your incision. It also decreases belly discomfort.  If you are being admitted to the hospital overnight, leave your suitcase in the car. After surgery it may be brought to your room.  If you are being discharged the day of surgery, you will not be allowed to drive home. You will need a responsible adult (18 years or older) to drive you home and stay with you that night.   If you are taking public transportation, you will need to have a responsible adult (18 years or older) with you. Please confirm with your physician that it is acceptable to use public transportation.   Please call the [facility]. at [phone] if you have any questions about these instructions.  Visitation Policy:  Patients undergoing a surgery or procedure may have one family member or support person with them as long as that person is not COVID-19 positive or experiencing its symptoms.  That person may remain in the waiting area during the procedure.  Inpatient Visitation Update:   In an effort to ensure the safety of our team members and our patients, we are implementing a change to our visitation policy:  Effective Monday, Aug. 9, at 7 a.m., inpatients will be allowed one support person.  o The support person may change daily.  o The support person must pass our screening, gel in and out, and wear a mask at all times, including in the patient's room.  o Patients must also wear a mask when staff or their support person are in the room.  o Masking is required regardless of vaccination status.  Systemwide, no visitors 17 or younger.

## 2019-12-08 ENCOUNTER — Encounter: Payer: Self-pay | Admitting: Family Medicine

## 2019-12-08 ENCOUNTER — Ambulatory Visit (INDEPENDENT_AMBULATORY_CARE_PROVIDER_SITE_OTHER): Payer: Medicare Other | Admitting: Family Medicine

## 2019-12-08 VITALS — BP 130/80 | HR 60 | Ht 71.0 in | Wt 280.0 lb

## 2019-12-08 DIAGNOSIS — I10 Essential (primary) hypertension: Secondary | ICD-10-CM

## 2019-12-08 DIAGNOSIS — L03116 Cellulitis of left lower limb: Secondary | ICD-10-CM

## 2019-12-08 DIAGNOSIS — E876 Hypokalemia: Secondary | ICD-10-CM

## 2019-12-08 DIAGNOSIS — K219 Gastro-esophageal reflux disease without esophagitis: Secondary | ICD-10-CM

## 2019-12-08 DIAGNOSIS — Z23 Encounter for immunization: Secondary | ICD-10-CM | POA: Diagnosis not present

## 2019-12-08 DIAGNOSIS — M4698 Unspecified inflammatory spondylopathy, sacral and sacrococcygeal region: Secondary | ICD-10-CM | POA: Diagnosis not present

## 2019-12-08 DIAGNOSIS — E039 Hypothyroidism, unspecified: Secondary | ICD-10-CM | POA: Diagnosis not present

## 2019-12-08 DIAGNOSIS — Z6835 Body mass index (BMI) 35.0-35.9, adult: Secondary | ICD-10-CM | POA: Diagnosis not present

## 2019-12-08 MED ORDER — MECLIZINE HCL 12.5 MG PO TABS: 12.5000 mg | ORAL_TABLET | Freq: Three times a day (TID) | ORAL | 0 refills | Status: DC | PRN

## 2019-12-08 MED ORDER — OMEPRAZOLE 40 MG PO CPDR: 40.0000 mg | DELAYED_RELEASE_CAPSULE | Freq: Every day | ORAL | 1 refills | Status: DC

## 2019-12-08 MED ORDER — SULFAMETHOXAZOLE-TRIMETHOPRIM 800-160 MG PO TABS: 1.0000 | ORAL_TABLET | Freq: Two times a day (BID) | ORAL | 0 refills | Status: DC

## 2019-12-08 MED ORDER — LOSARTAN POTASSIUM 25 MG PO TABS: 25.0000 mg | ORAL_TABLET | ORAL | 1 refills | Status: DC

## 2019-12-08 MED ORDER — TRIAMTERENE-HCTZ 75-50 MG PO TABS: 1.0000 | ORAL_TABLET | Freq: Every day | ORAL | 1 refills | Status: DC

## 2019-12-08 MED ORDER — LEVOTHYROXINE SODIUM 200 MCG PO TABS: ORAL_TABLET | ORAL | 1 refills | Status: DC

## 2019-12-08 MED ORDER — MUPIROCIN 2 % EX OINT: 1.0000 "application " | TOPICAL_OINTMENT | Freq: Two times a day (BID) | CUTANEOUS | 0 refills | Status: DC

## 2019-12-08 MED ORDER — HYDRALAZINE HCL 50 MG PO TABS: ORAL_TABLET | ORAL | 1 refills | Status: DC

## 2019-12-08 MED ORDER — POTASSIUM CHLORIDE CRYS ER 20 MEQ PO TBCR: 10.0000 meq | EXTENDED_RELEASE_TABLET | Freq: Every day | ORAL | 1 refills | Status: DC

## 2019-12-08 NOTE — Progress Notes (Signed)
Date:  12/08/2019   Name:  Michele Moore   DOB:  1950-05-24   MRN:  [id number]   Chief Complaint: Asthma, Hypertension, Hypothyroidism, Gastroesophageal Reflux, Dizziness, hypokalemia, and Flu Vaccine  Asthma There is no chest tightness, cough, difficulty breathing, frequent throat clearing, hemoptysis, hoarse voice, shortness of breath, sputum production or wheezing. This is a chronic problem. The current episode started more than 1 year ago. The problem has been gradually improving. Pertinent negatives include no appetite change, chest pain, dyspnea on exertion, ear congestion, ear pain, fever, headaches, heartburn, malaise/fatigue, myalgias, nasal congestion, orthopnea, PND, postnasal drip, rhinorrhea, sneezing, sore throat, sweats, trouble swallowing or weight loss. Her symptoms are aggravated by nothing. Her symptoms are alleviated by nothing. She reports moderate improvement on treatment. Her past medical history is significant for asthma.  Hypertension This is a chronic problem. The current episode started more than 1 year ago. The problem has been waxing and waning since onset. The problem is controlled. Associated symptoms include peripheral edema. Pertinent negatives include no anxiety, blurred vision, chest pain, headaches, malaise/fatigue, neck pain, orthopnea, palpitations, PND, shortness of breath or sweats. There are no known risk factors for coronary artery disease. Past treatments include nothing. The current treatment provides moderate improvement. There are no compliance problems.  Hypertensive end-organ damage includes kidney disease. There is no history of angina, CAD/MI, CVA, heart failure, left ventricular hypertrophy, PVD or retinopathy. There is no history of chronic renal disease, a hypertension causing med or renovascular disease.  Gastroesophageal Reflux She reports no chest pain, no coughing, no dysphagia, no heartburn, no hoarse voice, no sore throat or no wheezing. The  current episode started in the past 7 days. The problem has been rapidly improving. Pertinent negatives include no orthopnea or weight loss.  Dizziness This is a chronic problem. The current episode started more than 1 year ago. The problem occurs intermittently. The problem has been waxing and waning. Pertinent negatives include no chest pain, coughing, fever, headaches, myalgias, neck pain or sore throat.    Lab Results  Component Value Date   CREATININE 1.71 (H) 12/07/2019   BUN 30 (H) 12/07/2019   NA 141 12/07/2019   K 4.2 12/07/2019   CL 104 12/07/2019   CO2 26 12/07/2019   Lab Results  Component Value Date   CHOL 199 04/12/2019   HDL 84 04/12/2019   LDLCALC 102 (H) 04/12/2019   TRIG 69 04/12/2019   CHOLHDL 4.2 10/01/2018   Lab Results  Component Value Date   TSH 0.386 (L) 04/12/2019   Lab Results  Component Value Date   HGBA1C 5.4 10/01/2018   Lab Results  Component Value Date   WBC 5.5 12/07/2019   HGB 11.3 (L) 12/07/2019   HCT 34.0 (L) 12/07/2019   MCV 91.2 12/07/2019   PLT 159 12/07/2019   Lab Results  Component Value Date   ALT 17 12/07/2019   AST 25 12/07/2019   ALKPHOS 87 12/07/2019   BILITOT 0.7 12/07/2019     Review of Systems  Constitutional: Negative for appetite change, fever, malaise/fatigue and weight loss.  HENT: Negative for ear pain, hoarse voice, postnasal drip, rhinorrhea, sneezing, sore throat and trouble swallowing.   Eyes: Negative for blurred vision.  Respiratory: Negative for cough, hemoptysis, sputum production, shortness of breath and wheezing.   Cardiovascular: Negative for chest pain, dyspnea on exertion, palpitations, orthopnea and PND.  Gastrointestinal: Negative for dysphagia and heartburn.  Musculoskeletal: Negative for myalgias and neck pain.  Neurological: Positive  for dizziness. Negative for headaches.    Patient Active Problem List   Diagnosis Date Noted  . Arthritis of left knee 11/24/2019  . Left hip pain  08/08/2019  . Primary osteoarthritis of left hip 08/08/2019  . Non-seasonal allergic rhinitis 04/12/2019  . Benign essential hypertension 03/06/2019  . Proteinuria 03/06/2019  . Trochanteric bursitis of left hip 10/04/2018  . SI joint arthritis (left) 10/04/2018  . Exertional chest pain 09/30/2018  . CKD (chronic kidney disease), stage III ([place]) 09/30/2018  . Psoriatic arthritis ([place]) 04/23/2018  . Trigger ring finger of left hand 04/14/2018  . Female pelvic pain 01/20/2018  . History of revision of total knee arthroplasty 01/20/2018  . Iron deficiency anemia   . Heme + stool   . Acute gastritis without hemorrhage   . Gastric polyps   . Benign neoplasm of ascending colon   . Benign neoplasm of transverse colon   . Polyp of sigmoid colon   . Hypertension 10/01/2017  . Hypothyroidism 10/01/2017  . Gastroesophageal reflux disease 10/01/2017  . Hypokalemia 10/01/2017  . Mixed hyperlipidemia 10/01/2017  . Reactive airway disease 10/01/2017  . Bradycardia 08/24/2017  . Severe obesity (BMI 35.0-35.9 with comorbidity) ([place]) 08/18/2017  . Chronic gouty arthropathy without tophi 06/25/2017  . Encounter for long-term (current) use of high-risk medication 06/25/2017  . Positive ANA (antinuclear antibody) 06/17/2017  . Cervical spondylosis without myelopathy 06/16/2017  . Osteoarthritis of left shoulder due to rotator cuff injury 06/16/2017  . Sprain of anterior talofibular ligament of right ankle 06/16/2017  . Lumbar radiculopathy 04/21/2017  . Lumbar degenerative disc disease 04/21/2017  . Chronic pain syndrome 04/21/2017  . Spinal stenosis, lumbar region, with neurogenic claudication 04/21/2017  . SS-A antibody positive 03/05/2017  . Chronic pain of left knee 08/03/2015  . DOE (dyspnea on exertion) 12/28/2013    Allergies  Allergen Reactions  . Ampicillin Swelling  . Penicillins Anaphylaxis and Swelling    IgE = 10 (11/05/2017)  Has patient had a PCN reaction causing immediate  rash, facial/tongue/throat swelling, SOB or lightheadedness with hypotension: Yes Has patient had a PCN reaction causing severe rash involving mucus membranes or skin necrosis: No Has patient had a PCN reaction that required hospitalization: No Has patient had a PCN reaction occurring within the last 10 years: Yes If all of the above answers are "NO", then may proceed with Cephalosporin use.  . Vancomycin     Other reaction(s): Other (see comments)  . Vibramycin [Doxycycline Calcium] Rash    Past Surgical History:  Procedure Laterality Date  . ANKLE SURGERY    . CARPAL TUNNEL RELEASE     x3  . COLONOSCOPY  2000?  . COLONOSCOPY WITH PROPOFOL N/A 10/22/2017   Procedure: COLONOSCOPY WITH PROPOFOL;  Surgeon: Lucilla Lame, MD;  Location: [facility];  Service: Endoscopy;  Laterality: N/A;  . ESOPHAGOGASTRODUODENOSCOPY (EGD) WITH PROPOFOL N/A 10/22/2017   Procedure: ESOPHAGOGASTRODUODENOSCOPY (EGD) WITH PROPOFOL;  Surgeon: Lucilla Lame, MD;  Location: [facility];  Service: Endoscopy;  Laterality: N/A;  . FUSION OF TALONAVICULAR JOINT Right 08/03/2015   Procedure: TAILOR NAVICULAR JOINT FUSION - RIGHT ;  Surgeon: [name], DPM;  Location: [facility];  Service: Podiatry;  Laterality: Right;  . JOINT REPLACEMENT Right    knee x 3 ,right x1 and left x2  . POLYPECTOMY N/A 10/22/2017   Procedure: POLYPECTOMY;  Surgeon: Lucilla Lame, MD;  Location: [facility];  Service: Endoscopy;  Laterality: N/A;  . ROTATOR CUFF REPAIR    .  TONSILLECTOMY    . TOTAL KNEE REVISION Right 01/20/2018   Procedure: POLYETHYLENE EXCHANGE;  Surgeon: Dereck Leep, MD;  Location: [facility];  Service: Orthopedics;  Laterality: Right;  . TUBAL LIGATION    . UPPER GI ENDOSCOPY  2000?    Social History   Tobacco Use  . Smoking status: Former Smoker    Packs/day: 2.00    Years: 20.00    Pack years: 40.00    Types: Cigarettes    Quit date: 02/24/1993    Years since quitting: 26.8  . Smokeless  tobacco: Never Used  Vaping Use  . Vaping Use: Never used  Substance Use Topics  . Alcohol use: No    Alcohol/week: 0.0 standard drinks  . Drug use: No     Medication list has been reviewed and updated.  Current Meds  Medication Sig  . albuterol (VENTOLIN HFA) 108 (90 Base) MCG/ACT inhaler Inhale 2 puffs into the lungs every 4 (four) hours as needed for wheezing.  Marland Kitchen allopurinol (ZYLOPRIM) 100 MG tablet Take 200 mg by mouth every morning.   . betamethasone dipropionate 0.05 % cream APPLY TO PSORIASIS ON HANDS TWICE DAILY ONLY. AVOID FACE, GROIN AND AXILLA (Patient taking differently: as needed. )  . Cholecalciferol (VITAMIN D3) 2000 units TABS Take 2,000 Units by mouth daily.  . diclofenac sodium (VOLTAREN) 1 % GEL Apply 2 g topically 3 (three) times daily.   . ferrous sulfate 325 (65 FE) MG tablet Take 325 mg by mouth daily with breakfast.   . gabapentin (NEURONTIN) 600 MG tablet Take 1 tablet (600 mg total) by mouth at bedtime.  . hydrALAZINE (APRESOLINE) 50 MG tablet TAKE 1 TABLET(50 MG) BY MOUTH TWICE DAILY  . HYDROcodone-acetaminophen (NORCO) 7.5-325 MG tablet Take 1 tablet by mouth every 8 (eight) hours as needed for severe pain. Must last 30 days.  Marland Kitchen ketoconazole (NIZORAL) 2 % cream daily as needed.   Marland Kitchen levothyroxine (SYNTHROID) 200 MCG tablet TAKE 1 TABLET(200 MCG) BY MOUTH DAILY BEFORE BREAKFAST  . losartan (COZAAR) 25 MG tablet Take 1 tablet (25 mg total) by mouth daily. (Patient taking differently: Take 25 mg by mouth every morning. )  . meclizine (ANTIVERT) 12.5 MG tablet Take 1 tablet (12.5 mg total) by mouth 3 (three) times daily as needed for dizziness.  . Multiple Vitamins-Calcium (ONE-A-DAY WOMENS FORMULA PO) Take 1 tablet by mouth daily.  Marland Kitchen omeprazole (PRILOSEC) 40 MG capsule Take 1 capsule (40 mg total) by mouth daily. (Patient taking differently: Take 40 mg by mouth every morning. )  . Polyethyl Glycol-Propyl Glycol (SYSTANE OP) Place 1 drop into both eyes daily as  needed (dry eyes).   . potassium chloride SA (KLOR-CON) 20 MEQ tablet Take 0.5 tablets (10 mEq total) by mouth daily.  Marland Kitchen tiZANidine (ZANAFLEX) 4 MG tablet Take 1 tablet (4 mg total) by mouth 2 (two) times daily as needed for muscle spasms. (Patient taking differently: Take 4 mg by mouth at bedtime. )  . triamterene-hydrochlorothiazide (MAXZIDE) 75-50 MG tablet Take 1 tablet by mouth daily. (Patient taking differently: Take 1 tablet by mouth every morning. )    PHQ 2/9 Scores 12/08/2019 09/08/2019 04/25/2019 03/09/2019  PHQ - 2 Score 0 0 0 0  PHQ- 9 Score 0 0 - -    GAD 7 : Generalized Anxiety Score 12/08/2019 09/08/2019  Nervous, Anxious, on Edge 0 0  Control/stop worrying 0 0  Worry too much - different things 0 0  Trouble relaxing 0 0  Restless 0  0  Easily annoyed or irritable 0 0  Afraid - awful might happen 0 0  Total GAD 7 Score 0 0    BP Readings from Last 3 Encounters:  12/08/19 (!) 130/100  12/07/19 (!) 141/59  09/08/19 (!) 142/74    Physical Exam Vitals and nursing note reviewed.  Constitutional:      Appearance: She is well-developed.  HENT:     Head: Normocephalic.     Right Ear: External ear normal.     Left Ear: External ear normal.  Eyes:     General: Lids are everted, no foreign bodies appreciated. No scleral icterus.       Left eye: No foreign body or hordeolum.     Conjunctiva/sclera: Conjunctivae normal.     Right eye: Right conjunctiva is not injected.     Left eye: Left conjunctiva is not injected.     Pupils: Pupils are equal, round, and reactive to light.  Neck:     Thyroid: No thyromegaly.     Vascular: No JVD.     Trachea: No tracheal deviation.  Cardiovascular:     Rate and Rhythm: Normal rate and regular rhythm.     Heart sounds: Normal heart sounds. No murmur heard.  No friction rub. No gallop.   Pulmonary:     Effort: Pulmonary effort is normal. No respiratory distress.     Breath sounds: Normal breath sounds. No wheezing or rales.   Abdominal:     General: Bowel sounds are normal.     Palpations: Abdomen is soft. There is no mass.     Tenderness: There is no abdominal tenderness. There is no guarding or rebound.  Musculoskeletal:        General: No tenderness. Normal range of motion.     Cervical back: Normal range of motion and neck supple.  Lymphadenopathy:     Cervical: No cervical adenopathy.  Skin:    General: Skin is warm.     Findings: No rash.  Neurological:     Mental Status: She is alert and oriented to person, place, and time.     Cranial Nerves: No cranial nerve deficit.     Deep Tendon Reflexes: Reflexes normal.  Psychiatric:        Mood and Affect: Mood is not anxious or depressed.     Wt Readings from Last 3 Encounters:  12/08/19 280 lb (127 kg)  12/07/19 283 lb 8.2 oz (128.6 kg)  09/08/19 282 lb (127.9 kg)    BP (!) 130/100   Pulse 60   Ht 5\' 11"  (1.803 m)   Wt 280 lb (127 kg)   BMI 39.05 kg/m   Assessment and Plan: 1. Hypertension, unspecified type Chronic.  Controlled.  Stable.  Initially blood pressure was noted to be slightly elevated diastolic when she walked back to the room but after resting for 10 minutes was rechecked and it was noted to be 130/80.  We will continue hydralazine 50 mg twice a day, losartan 25 mg 1 every morning and triamterene hydrochlorothiazide 75-51 a day. - hydrALAZINE (APRESOLINE) 50 MG tablet; TAKE 1 TABLET(50 MG) BY MOUTH TWICE DAILY  Dispense: 180 tablet; Refill: 1 - losartan (COZAAR) 25 MG tablet; Take 1 tablet (25 mg total) by mouth every morning.  Dispense: 90 tablet; Refill: 1 - triamterene-hydrochlorothiazide (MAXZIDE) 75-50 MG tablet; Take 1 tablet by mouth daily.  Dispense: 90 tablet; Refill: 1  2. Hypothyroidism, unspecified type Chronic.  Controlled.  Stable.  Patient's been having some  hoarseness to think may be secondary to her thyroid we will recheck her TSH with panel. - levothyroxine (SYNTHROID) 200 MCG tablet; TAKE 1 TABLET(200 MCG) BY  MOUTH DAILY BEFORE BREAKFAST  Dispense: 90 tablet; Refill: 1 - Thyroid Panel With TSH  3. Gastroesophageal reflux disease Chronic.  Controlled.  Stable.  Continue omeprazole 40 mg once a day. - omeprazole (PRILOSEC) 40 MG capsule; Take 1 capsule (40 mg total) by mouth daily.  Dispense: 90 capsule; Refill: 1  4. Hypokalemia Chronic.  Controlled.  Stable.  Continue potassium chloride 20 mEq 1/2 tablet the equivalent of 10 mEq daily. - potassium chloride SA (KLOR-CON) 20 MEQ tablet; Take 0.5 tablets (10 mEq total) by mouth daily.  Dispense: 45 tablet; Refill: 1  5. Cellulitis of left lower extremity Patient has an area on her left leg on the anterior tibial surface.  Patient has been placed on Septra double strength 1 twice a day for 10 days and also given Bactroban be used in the place of Neosporin.  6. Unspecified inflammatory spondylopathy, sacral and sacrococcygeal region [facility]) Noted and patient is followed by orthopedics and is on anti-inflammatories as needed.  7. Severe obesity (BMI 35.0-35.9 with comorbidity) ([place]) Health risks of being over weight were discussed and patient was counseled on weight loss options and exercise.

## 2019-12-09 ENCOUNTER — Telehealth: Payer: Self-pay | Admitting: Family Medicine

## 2019-12-09 ENCOUNTER — Other Ambulatory Visit: Payer: Self-pay | Admitting: Family Medicine

## 2019-12-09 ENCOUNTER — Other Ambulatory Visit: Payer: Self-pay

## 2019-12-09 DIAGNOSIS — E039 Hypothyroidism, unspecified: Secondary | ICD-10-CM

## 2019-12-09 LAB — THYROID PANEL WITH TSH
Free Thyroxine Index: 3 (ref 1.2–4.9)
T3 Uptake Ratio: 26 % (ref 24–39)
T4, Total: 11.4 ug/dL (ref 4.5–12.0)
TSH: 0.24 u[IU]/mL — ABNORMAL LOW (ref 0.450–4.500)

## 2019-12-09 LAB — URINE CULTURE
Culture: >=100000 — AB
Special Requests: NORMAL

## 2019-12-09 MED ORDER — LEVOTHYROXINE SODIUM 175 MCG PO TABS: 175.0000 ug | ORAL_TABLET | Freq: Every day | ORAL | 1 refills | Status: DC

## 2019-12-09 NOTE — Telephone Encounter (Signed)
Pt is calling the levothyroxine rx did not go through escribe please resend to [facility] [address]

## 2019-12-09 NOTE — Telephone Encounter (Signed)
Medication: Patient is asking can this be resent. The pharmacy did not receive it. levothyroxine (SYNTHROID) 175 MCG tablet [[id number]]   Has the patient contacted their pharmacy? YES  (Agent: If no, request that the patient contact the pharmacy for the refill.) (Agent: If yes, when and what did the pharmacy advise?)  Preferred Pharmacy (with phone number or street name): [facility] [place], [place] [address]  [place], [place] [address]  Phone:  [phone] Fax:  [phone]   Agent: Please be advised that RX refills may take up to 3 business days. We ask that you follow-up with your pharmacy.

## 2019-12-09 NOTE — Progress Notes (Signed)
Sent in new script for 143mcg levo

## 2019-12-09 NOTE — Telephone Encounter (Signed)
Called and left prescription on voicemail at [facility]

## 2019-12-12 ENCOUNTER — Other Ambulatory Visit: Payer: Self-pay

## 2019-12-12 ENCOUNTER — Other Ambulatory Visit
Admission: RE | Admit: 2019-12-12 | Discharge: 2019-12-12 | Disposition: A | Discharge status: Home / Self Care | Payer: Medicare Other | Source: Ambulatory Visit | Attending: Orthopedic Surgery | Admitting: Orthopedic Surgery

## 2019-12-12 DIAGNOSIS — Z96652 Presence of left artificial knee joint: Secondary | ICD-10-CM | POA: Diagnosis not present

## 2019-12-12 DIAGNOSIS — Z20822 Contact with and (suspected) exposure to covid-19: Secondary | ICD-10-CM | POA: Insufficient documentation

## 2019-12-12 DIAGNOSIS — Z01812 Encounter for preprocedural laboratory examination: Secondary | ICD-10-CM | POA: Insufficient documentation

## 2019-12-12 LAB — SARS CORONAVIRUS 2 (TAT 6-24 HRS): SARS Coronavirus 2: NEGATIVE

## 2019-12-14 ENCOUNTER — Encounter: Admission: RE | Payer: Self-pay | Source: Home / Self Care

## 2019-12-14 ENCOUNTER — Inpatient Hospital Stay: Admission: RE | Admit: 2019-12-14 | Payer: Medicare Other | Source: Home / Self Care | Admitting: Orthopedic Surgery

## 2019-12-14 SURGERY — TOTAL KNEE REVISION
Anesthesia: Choice | Site: Knee | Laterality: Left

## 2019-12-28 ENCOUNTER — Other Ambulatory Visit: Payer: Self-pay | Admitting: Family Medicine

## 2019-12-28 DIAGNOSIS — E039 Hypothyroidism, unspecified: Secondary | ICD-10-CM

## 2020-01-02 ENCOUNTER — Other Ambulatory Visit: Payer: Self-pay | Admitting: Family Medicine

## 2020-01-02 DIAGNOSIS — E039 Hypothyroidism, unspecified: Secondary | ICD-10-CM

## 2020-01-13 ENCOUNTER — Telehealth: Payer: Self-pay | Admitting: Student in an Organized Health Care Education/Training Program

## 2020-01-13 NOTE — Telephone Encounter (Signed)
Patient states she only received 20 tablets of her muscle relaxer, should have been more, please check and let patient know

## 2020-01-13 NOTE — Telephone Encounter (Signed)
Informed patient that she should have a quantity of 60.  Spoke to the pharmacy and they state that in their records it shows that she got #60.  Informed patient to bring bottle back to the pharmacy.

## 2020-01-31 ENCOUNTER — Other Ambulatory Visit: Payer: Self-pay | Admitting: Family Medicine

## 2020-01-31 DIAGNOSIS — E039 Hypothyroidism, unspecified: Secondary | ICD-10-CM

## 2020-01-31 NOTE — Telephone Encounter (Signed)
Future visit in 4 months . Needs labs with next visit

## 2020-02-13 ENCOUNTER — Telehealth: Payer: Self-pay | Admitting: Student in an Organized Health Care Education/Training Program

## 2020-02-13 NOTE — Telephone Encounter (Signed)
Patient lvmail stating she is sick, sinus infection and just doesn't feel well. She has appt for F2F med mgmt on Thurs. Can she get virtual instead for this visit.

## 2020-02-14 ENCOUNTER — Telehealth: Payer: Self-pay

## 2020-02-14 NOTE — Telephone Encounter (Signed)
Yes, I think that was appropriate.  Thank you!

## 2020-02-14 NOTE — Telephone Encounter (Signed)
I called patient and changed to Virtual Visit .

## 2020-02-15 ENCOUNTER — Telehealth: Payer: Self-pay

## 2020-02-15 ENCOUNTER — Encounter: Payer: Self-pay | Admitting: Student in an Organized Health Care Education/Training Program

## 2020-02-16 ENCOUNTER — Ambulatory Visit
Payer: Medicare Other | Attending: Student in an Organized Health Care Education/Training Program | Admitting: Student in an Organized Health Care Education/Training Program

## 2020-02-16 ENCOUNTER — Encounter: Payer: Self-pay | Admitting: Student in an Organized Health Care Education/Training Program

## 2020-02-16 ENCOUNTER — Other Ambulatory Visit: Payer: Self-pay

## 2020-02-16 DIAGNOSIS — G8929 Other chronic pain: Secondary | ICD-10-CM

## 2020-02-16 DIAGNOSIS — M47816 Spondylosis without myelopathy or radiculopathy, lumbar region: Secondary | ICD-10-CM | POA: Diagnosis not present

## 2020-02-16 DIAGNOSIS — M47818 Spondylosis without myelopathy or radiculopathy, sacral and sacrococcygeal region: Secondary | ICD-10-CM | POA: Diagnosis not present

## 2020-02-16 DIAGNOSIS — M47812 Spondylosis without myelopathy or radiculopathy, cervical region: Secondary | ICD-10-CM | POA: Diagnosis not present

## 2020-02-16 DIAGNOSIS — M25552 Pain in left hip: Secondary | ICD-10-CM | POA: Diagnosis not present

## 2020-02-16 DIAGNOSIS — M19012 Primary osteoarthritis, left shoulder: Secondary | ICD-10-CM | POA: Diagnosis not present

## 2020-02-16 DIAGNOSIS — G894 Chronic pain syndrome: Secondary | ICD-10-CM

## 2020-02-16 DIAGNOSIS — M19112 Post-traumatic osteoarthritis, left shoulder: Secondary | ICD-10-CM

## 2020-02-16 DIAGNOSIS — M5136 Other intervertebral disc degeneration, lumbar region: Secondary | ICD-10-CM | POA: Diagnosis not present

## 2020-02-16 DIAGNOSIS — M1612 Unilateral primary osteoarthritis, left hip: Secondary | ICD-10-CM | POA: Diagnosis not present

## 2020-02-16 DIAGNOSIS — M25562 Pain in left knee: Secondary | ICD-10-CM

## 2020-02-16 DIAGNOSIS — M7062 Trochanteric bursitis, left hip: Secondary | ICD-10-CM

## 2020-02-16 DIAGNOSIS — S46002S Unspecified injury of muscle(s) and tendon(s) of the rotator cuff of left shoulder, sequela: Secondary | ICD-10-CM

## 2020-02-16 DIAGNOSIS — M1712 Unilateral primary osteoarthritis, left knee: Secondary | ICD-10-CM

## 2020-02-16 DIAGNOSIS — M545 Low back pain, unspecified: Secondary | ICD-10-CM

## 2020-02-16 MED ORDER — HYDROCODONE-ACETAMINOPHEN 7.5-325 MG PO TABS: 1.0000 | ORAL_TABLET | Freq: Three times a day (TID) | ORAL | 0 refills | Status: DC | PRN

## 2020-02-16 NOTE — Progress Notes (Signed)
Patient: Michele Moore  Service Category: E/M  Provider: Gillis Santa, MD  DOB: [birth year]  DOS: 02/16/2020  Location: Office  MRN: [id number]  Setting: Ambulatory outpatient  Referring Provider: Juline Patch, MD  Type: Established Patient  Specialty: Interventional Pain Management  PCP: Juline Patch, MD  Location: Home  Delivery: TeleHealth     Virtual Encounter - Pain Management PROVIDER NOTE: Information contained herein reflects review and annotations entered in association with encounter. Interpretation of such information and data should be left to medically-trained personnel. Information provided to patient can be located elsewhere in the medical record under "Patient Instructions". Document created using STT-dictation technology, any transcriptional errors that may result from process are unintentional.    Contact & Pharmacy Preferred: [place]: [phone] (home) Mobile: [phone] (mobile) E-mail: No e-mail address on record  [place], [place] - [address] [place] [address] Phone: [phone] Fax: [phone]   Pre-screening  [name] offered "in-person" vs "virtual" encounter. She indicated preferring virtual for this encounter.   Reason COVID-19*  Social distancing based on CDC and AMA recommendations.   I contacted Michele Moore on 02/16/2020 via telephone.      I clearly identified myself as Gillis Santa, MD. I verified that I was speaking with the correct person using two identifiers (Name: Michele Moore, and date of birth: 12-02-1950).  Consent I sought verbal advanced consent from Michele Moore for virtual visit interactions. I informed [name] of possible security and privacy concerns, risks, and limitations associated with providing "not-in-person" medical evaluation and management services. I also informed [name] of the availability of "in-person" appointments. Finally,  I informed her that there would be a charge for the virtual visit and that she could be  personally, fully or partially, financially responsible for it. Ms. Sheer expressed understanding and agreed to proceed.   Historic Elements   Michele Moore is a 69 y.o. year old, female patient evaluated today after our last contact on 02/13/2020. [name]  has a past medical history of Allergy, Anemia, Arthritis, Asthma, Cataract, Chronic kidney disease, Collagen vascular disease ([place]), Complication of anesthesia, Dyspnea, Dysrhythmia, Family history of adverse reaction to anesthesia, GERD (gastroesophageal reflux disease), Glaucoma, H/O tooth extraction, Hemorrhoid, History of hiatal hernia, History of kidney stones, Hypertension, Hypothyroidism, Migraines, Mixed hyperlipidemia, Multiple gastric ulcers, Thyroid disease, and Wears dentures. She also  has a past surgical history that includes Carpal tunnel release; Rotator cuff repair; Ankle surgery; Tubal ligation; Colonoscopy (2000?); Upper gi endoscopy (2000?); Tonsillectomy; Fusion of talonavicular joint (Right, 08/03/2015); Colonoscopy with propofol (N/A, 10/22/2017); Esophagogastroduodenoscopy (egd) with propofol (N/A, 10/22/2017); polypectomy (N/A, 10/22/2017); Total knee revision (Right, 01/20/2018); and Joint replacement (Right). [name] has a current medication list which includes the following prescription(s): albuterol, allopurinol, betamethasone dipropionate, vitamin d3, diclofenac sodium, ferrous sulfate, gabapentin, hydralazine, ketoconazole, losartan, meclizine, multiple vitamins-calcium, mupirocin ointment, omeprazole, polyethyl glycol-propyl glycol, potassium chloride sa, sulfamethoxazole-trimethoprim, synthroid, tizanidine, triamterene-hydrochlorothiazide, aspirin, [START ON 02/28/2020] hydrocodone-acetaminophen, [START ON 03/29/2020] hydrocodone-acetaminophen, [START ON 04/28/2020] hydrocodone-acetaminophen, methotrexate, omega-3 acid ethyl esters, triamcinolone,  [DISCONTINUED] fluticasone, and [DISCONTINUED] mometasone. She  reports that she quit smoking about 26 years ago. Her smoking use included cigarettes. She has a 40.00 pack-year smoking history. She has never used smokeless tobacco. She reports that she does not drink alcohol and does not use drugs. [name] is allergic to ampicillin, penicillins, vancomycin, and vibramycin [  doxycycline calcium].   HPI  Today, she is being contacted for medication management.  Cold/sinus infection for last 3 weeks, unable to come in today for that reason Patient states that her daughter previously had Covid and was in the ICU for many weeks.  She is currently out but still has limited functional capacity so Michele Moore has been participating more in helping to take care of her grandchildren.  She has been exerting herself more which has resulted in increased left shoulder pain as well as increased low back pain.  Patient has a history of lumbar facet arthropathy as well as lumbar spondylosis as well as left rotator cuff dysfunction and glenohumeral arthropathy.  We discussed repeating therapeutic lumbar facet medial branch nerve blocks as well as left shoulder steroid injection.  Patient would like to proceed with this after the new year.  Otherwise, Patient continues multimodal pain regimen as prescribed.     Pharmacotherapy Assessment  Analgesic: 01/29/2020  11/24/2019   1  Hydrocodone-Acetamin 7.5-325  90.00  30  Bi Lat  [id number]  Wal ([id number])  0/0  22.50 MME  Medicare  [place]      Monitoring: [place] PMP: PDMP reviewed during this encounter.       Pharmacotherapy: No side-effects or adverse reactions reported. Compliance: No problems identified. Effectiveness: Clinically acceptable. Plan: Refer to "POC".  UDS:  Summary  Date Value Ref Range Status  05/31/2019 Note  Final    Comment:    ==================================================================== [employer] 13  (MW) ==================================================================== Test                             Result       Flag       Units Drug Present and Declared for Prescription Verification   Hydrocodone                    305          EXPECTED   ng/mg creat   Hydromorphone                  72           EXPECTED   ng/mg creat   Norhydrocodone                 208          EXPECTED   ng/mg creat    Sources of hydrocodone include scheduled prescription medications.    Hydromorphone and norhydrocodone are expected metabolites of    hydrocodone. Hydromorphone is also available as a scheduled    prescription medication. ==================================================================== Test                      Result    Flag   Units      Ref Range   Creatinine              131              mg/dL      >=20 ==================================================================== Declared Medications:  The flagging and interpretation on this report are based on the  following declared medications.  Unexpected results may arise from  inaccuracies in the declared medications.  **Note: The testing scope of this panel includes these medications:  Hydrocodone (Norco)  **Note: The testing scope of this panel does not include the  following reported medications:  Acetaminophen (Norco)  Albuterol  Allopurinol (Zyloprim)  Betamethasone  Calcium  Diclofenac (Voltaren)  Eye Drops  Gabapentin (Neurontin)  Hydralazine (Apresoline)  Hydrochlorothiazide (Maxzide)  Iron  Ketoconazole (Nizoral)  Levothyroxine (Synthroid)  Losartan (Cozaar)  Meclizine (Antivert)  Methotrexate  Mometasone (Nasonex)  Multivitamin  Omega-3 Fatty Acids  Omeprazole (Prilosec)  Potassium (Klor-Con)  Tizanidine (Zanaflex)  Triamcinolone (Kenalog)  Triamterene (Maxzide)  Vitamin D3 ==================================================================== For clinical consultation, please call [phone]. ====================================================================     Laboratory Chemistry Profile   Renal Lab Results  Component Value Date   BUN 30 (H) 12/07/2019   CREATININE 1.71 (H) 12/07/2019   BCR 17 04/12/2019   GFRAA 30 (L) 08/28/2019   GFRNONAA 30 (L) 12/07/2019     Hepatic Lab Results  Component Value Date   AST 25 12/07/2019   ALT 17 12/07/2019   ALBUMIN 4.1 12/07/2019   ALKPHOS 87 12/07/2019     Electrolytes Lab Results  Component Value Date   NA 141 12/07/2019   K 4.2 12/07/2019   CL 104 12/07/2019   CALCIUM 9.9 12/07/2019   MG 2.0 10/01/2018   PHOS 4.0 04/12/2019     Bone No results found for: VD25OH, VD125OH2TOT, OY7741OI7, OM7672CN4, 25OHVITD1, 25OHVITD2, 25OHVITD3, TESTOFREE, TESTOSTERONE   Inflammation (CRP: Acute Phase) (ESR: Chronic Phase) Lab Results  Component Value Date   CRP 0.6 12/07/2019   ESRSEDRATE 48 (H) 12/07/2019       Note: Above Lab results reviewed.  Imaging  CT Soft Tissue Neck Wo Contrast CLINICAL DATA:  Initial evaluation for foreign body swallowed bone.  EXAM: CT NECK WITHOUT CONTRAST  TECHNIQUE: Multidetector CT imaging of the neck was performed following the standard protocol without intravenous contrast.  COMPARISON:  None.  FINDINGS: Pharynx and larynx: Oral cavity within normal limits without discrete mass or collection. No acute abnormality seen about the remaining dentition. Curvilinear calcified density seen extending from the left vallecula superiorly towards the right oropharynx seen, consistent with a retained foreign body/bone (series 2, images 51-35). Remainder of the oropharynx and nasopharynx within normal limits. Parapharyngeal fat maintained. No retropharyngeal collection. Epiglottis normal. Remainder of the hypopharynx and supraglottic larynx within normal limits. Glottis normal. Subglottic airway clear. No other radiopaque foreign body identified.  Salivary glands: Mild  diffuse fatty atrophy seen throughout the parotid and submandibular glands bilaterally. Salivary glands otherwise unremarkable.  Thyroid: Thyroid grossly within normal limits, although evaluation mildly limited by motion.  Lymph nodes: No pathologically enlarged lymph nodes identified within the neck.  Vascular: Moderate atherosclerotic change noted about the aortic arch. Evaluation for vascular patency limited by lack of IV contrast.  Limited intracranial: Unremarkable.  Visualized orbits: Unremarkable.  Mastoids and visualized paranasal sinuses: Visualized paranasal sinuses are clear. Visualized mastoids and middle ear cavities are well pneumatized and free of fluid.  Skeleton: No acute osseous abnormality. No discrete or worrisome osseous lesions. Mild-to-moderate multilevel cervical spondylosis, most notable at C5-6.  Upper chest: Visualized upper chest demonstrates no acute finding. Partially visualized lungs are grossly clear.  Other: None.  IMPRESSION: 1. Curvilinear calcified density extending from the left vallecula superiorly towards the right oropharynx, consistent with a retained foreign body/bone. 2. No other acute abnormality identified within the neck. No other radiopaque foreign body identified. 3. Aortic Atherosclerosis (ICD10-I70.0).  Electronically Signed   By: Jeannine Boga M.D.   On: 08/27/2019 23:57  Assessment  The primary encounter diagnosis was Osteoarthritis of left shoulder due to rotator cuff injury. Diagnoses of Lumbar spondylosis, Chronic pain of left knee, Arthritis of left knee, Primary osteoarthritis of left hip,  SI joint arthritis (left), Lumbar degenerative disc disease, Left hip pain, Cervical facet syndrome, Trochanteric bursitis of left hip, Chronic pain syndrome, Primary osteoarthritis of left shoulder, and Chronic bilateral low back pain without sciatica were also pertinent to this visit.  Plan of Care   [name]  has a current medication list which includes the following long-term medication(s): albuterol, allopurinol, ferrous sulfate, gabapentin, hydralazine, losartan, omeprazole, potassium chloride sa, synthroid, triamterene-hydrochlorothiazide, [START ON 02/28/2020] hydrocodone-acetaminophen, [START ON 03/29/2020] hydrocodone-acetaminophen, [START ON 04/28/2020] hydrocodone-acetaminophen, omega-3 acid ethyl esters, [DISCONTINUED] fluticasone, and [DISCONTINUED] mometasone.  Pharmacotherapy (Medications Ordered): Meds ordered this encounter  Medications  . HYDROcodone-acetaminophen (NORCO) 7.5-325 MG tablet    Sig: Take 1 tablet by mouth every 8 (eight) hours as needed for severe pain. Must last 30 days.    Dispense:  90 tablet    Refill:  0    Chronic Pain. (STOP Act - Not applicable). Fill one day early if closed on scheduled refill date.  Marland Kitchen HYDROcodone-acetaminophen (NORCO) 7.5-325 MG tablet    Sig: Take 1 tablet by mouth every 8 (eight) hours as needed for severe pain. Must last 30 days.    Dispense:  90 tablet    Refill:  0    Chronic Pain. (STOP Act - Not applicable). Fill one day early if closed on scheduled refill date.  Marland Kitchen HYDROcodone-acetaminophen (NORCO) 7.5-325 MG tablet    Sig: Take 1 tablet by mouth every 8 (eight) hours as needed for severe pain. Must last 30 days.    Dispense:  90 tablet    Refill:  0    Chronic Pain. (STOP Act - Not applicable). Fill one day early if closed on scheduled refill date.   Orders:  Orders Placed This Encounter  Procedures  . LUMBAR FACET(MEDIAL BRANCH NERVE BLOCK) MBNB    Standing Status:   Future    Standing Expiration Date:   03/18/2020    Scheduling Instructions:     Procedure: Lumbar facet block (AKA.: Lumbosacral medial branch nerve block)     Side: Bilateral     Level: L3-4, L4-5,  Facets (L3, L4, L5,  Medial Branch Nerves)     Sedation: Patient's choice.     Timeframe: ASAA    Order Specific Question:   Where will this procedure be performed?     Answer:   [facility]  . SHOULDER INJECTION    Standing Status:   Future    Standing Expiration Date:   05/16/2020    Scheduling Instructions:     Side: LEFT     Sedation: Patient's choice.     Timeframe: As soon as schedule allows    Order Specific Question:   Where will this procedure be performed?    Answer:   [facility]    Comments:      Follow-up plan:   Return in about 3 weeks (around 03/08/2020) for B/L L3,4,5 Facet block and left shoulder injection with sedation (40 mins.   Recent Visits Date Type Provider Dept  11/24/19 Telemedicine Gillis Santa, MD [facility]  Showing recent visits within past 90 days and meeting all other requirements Today's Visits Date Type Provider Dept  02/16/20 Telemedicine Gillis Santa, MD [facility]  Showing today's visits and meeting all other requirements Future Appointments No visits were found meeting these conditions. Showing future appointments within next 90 days and meeting all other requirements  I discussed the assessment and treatment plan with the patient. The patient was  provided an opportunity to ask questions and all were answered. The patient agreed with the plan and demonstrated an understanding of the instructions.  Patient advised to call back or seek an in-person evaluation if the symptoms or condition worsens.  Duration of encounter:30 minutes.  Note by: Gillis Santa, MD Date: 02/16/2020; Time: 9:55 AM

## 2020-02-23 ENCOUNTER — Other Ambulatory Visit: Payer: Self-pay | Admitting: Student in an Organized Health Care Education/Training Program

## 2020-02-23 DIAGNOSIS — G894 Chronic pain syndrome: Secondary | ICD-10-CM

## 2020-03-05 ENCOUNTER — Emergency Department
Admission: EM | Admit: 2020-03-05 | Discharge: 2020-03-05 | Disposition: A | Discharge status: Home / Self Care | Payer: Medicare Other | Attending: Emergency Medicine | Admitting: Emergency Medicine

## 2020-03-05 ENCOUNTER — Emergency Department: Payer: Medicare Other

## 2020-03-05 ENCOUNTER — Encounter: Payer: Self-pay | Admitting: Emergency Medicine

## 2020-03-05 ENCOUNTER — Ambulatory Visit: Payer: Self-pay | Admitting: *Deleted

## 2020-03-05 ENCOUNTER — Other Ambulatory Visit: Payer: Self-pay

## 2020-03-05 DIAGNOSIS — Z9889 Other specified postprocedural states: Secondary | ICD-10-CM | POA: Diagnosis not present

## 2020-03-05 DIAGNOSIS — M19071 Primary osteoarthritis, right ankle and foot: Secondary | ICD-10-CM | POA: Diagnosis not present

## 2020-03-05 DIAGNOSIS — I129 Hypertensive chronic kidney disease with stage 1 through stage 4 chronic kidney disease, or unspecified chronic kidney disease: Secondary | ICD-10-CM | POA: Insufficient documentation

## 2020-03-05 DIAGNOSIS — N183 Chronic kidney disease, stage 3 unspecified: Secondary | ICD-10-CM | POA: Insufficient documentation

## 2020-03-05 DIAGNOSIS — Z7951 Long term (current) use of inhaled steroids: Secondary | ICD-10-CM | POA: Diagnosis not present

## 2020-03-05 DIAGNOSIS — E039 Hypothyroidism, unspecified: Secondary | ICD-10-CM | POA: Insufficient documentation

## 2020-03-05 DIAGNOSIS — S99921A Unspecified injury of right foot, initial encounter: Secondary | ICD-10-CM | POA: Diagnosis present

## 2020-03-05 DIAGNOSIS — Z79899 Other long term (current) drug therapy: Secondary | ICD-10-CM | POA: Diagnosis not present

## 2020-03-05 DIAGNOSIS — J0100 Acute maxillary sinusitis, unspecified: Secondary | ICD-10-CM

## 2020-03-05 DIAGNOSIS — M7989 Other specified soft tissue disorders: Secondary | ICD-10-CM | POA: Diagnosis not present

## 2020-03-05 DIAGNOSIS — Z96651 Presence of right artificial knee joint: Secondary | ICD-10-CM | POA: Insufficient documentation

## 2020-03-05 DIAGNOSIS — Z87891 Personal history of nicotine dependence: Secondary | ICD-10-CM | POA: Insufficient documentation

## 2020-03-05 DIAGNOSIS — S9031XA Contusion of right foot, initial encounter: Secondary | ICD-10-CM | POA: Diagnosis not present

## 2020-03-05 DIAGNOSIS — J45909 Unspecified asthma, uncomplicated: Secondary | ICD-10-CM | POA: Insufficient documentation

## 2020-03-05 DIAGNOSIS — Z7982 Long term (current) use of aspirin: Secondary | ICD-10-CM | POA: Insufficient documentation

## 2020-03-05 DIAGNOSIS — W57XXXA Bitten or stung by nonvenomous insect and other nonvenomous arthropods, initial encounter: Secondary | ICD-10-CM | POA: Diagnosis not present

## 2020-03-05 DIAGNOSIS — Z981 Arthrodesis status: Secondary | ICD-10-CM | POA: Diagnosis not present

## 2020-03-05 MED ORDER — AZITHROMYCIN 250 MG PO TABS: ORAL_TABLET | ORAL | 0 refills | Status: DC

## 2020-03-05 MED ORDER — PREDNISONE 50 MG PO TABS: 50.0000 mg | ORAL_TABLET | Freq: Every day | ORAL | 0 refills | Status: DC

## 2020-03-05 MED ORDER — AZITHROMYCIN 500 MG PO TABS
500.0000 mg | ORAL_TABLET | Freq: Once | ORAL | Status: AC
  Administered 2020-03-05: 500 mg via ORAL
  Filled 2020-03-05: qty 1

## 2020-03-05 MED ORDER — PREDNISONE 20 MG PO TABS
60.0000 mg | ORAL_TABLET | Freq: Once | ORAL | Status: AC
  Administered 2020-03-05: 60 mg via ORAL
  Filled 2020-03-05: qty 3

## 2020-03-05 MED ORDER — HYDROCODONE-ACETAMINOPHEN 5-325 MG PO TABS
1.0000 | ORAL_TABLET | Freq: Once | ORAL | Status: AC
  Administered 2020-03-05: 1 via ORAL
  Filled 2020-03-05: qty 1

## 2020-03-05 NOTE — Telephone Encounter (Signed)
Summary: Call back request    Pt called reporting swelling for 3 days, her communication is very hard to understand. Speech is not clear/audio quality is poor. Please advise, held for triage.    Best contact: [phone]      Call to patient- patient states she has a TV fall on her R foot. Foot is swollen 3 times normal size and toes are numb. Patient also states the ulcer on L leg is not better- size of 50 cent piece. Patient also requesting treatment for sinus congestion. Advised patient to go to ED now for evaluation of her foot- she states she will call for transportation if will call 911 if she can not get ride. Reason for Disposition . [1] Can't walk or can barely walk AND [2] new-onset  Answer Assessment - Initial Assessment Questions 1. ONSET: "When did the swelling start?" (e.g., minutes, hours, days)     R foot swollen- bruised- toes are numb, L leg swelling- swelling- open- red 50 cent peice 2. LOCATION: "What part of the leg is swollen?"  "Are both legs swollen or just one leg?"     R foot, L leg -pain 3. SEVERITY: "How bad is the swelling?" (e.g., localized; mild, moderate, severe)  - Localized - small area of swelling localized to one leg  - MILD pedal edema - swelling limited to foot and ankle, pitting edema < 1/4 inch (6 mm) deep, rest and elevation eliminate most or all swelling  - MODERATE edema - swelling of lower leg to knee, pitting edema > 1/4 inch (6 mm) deep, rest and elevation only partially reduce swelling  - SEVERE edema - swelling extends above knee, facial or hand swelling present     moderate 4. REDNESS: "Does the swelling look red or infected?"     yes 5. PAIN: "Is the swelling painful to touch?" If Yes, ask: "How painful is it?"   (Scale 1-10; mild, moderate or severe)     severe 6. FEVER: "Do you have a fever?" If Yes, ask: "What is it, how was it measured, and when did it start?"      no 7. CAUSE: "What do you think is causing the leg swelling?"     TV  fell on her R foot 8. MEDICAL HISTORY: "Do you have a history of heart failure, kidney disease, liver failure, or cancer?"     Kidney disease 9. RECURRENT SYMPTOM: "Have you had leg swelling before?" If Yes, ask: "When was the last time?" "What happened that time?"    yes 10. OTHER SYMPTOMS: "Do you have any other symptoms?" (e.g., chest pain, difficulty breathing)       Congestion in sinus congestion 11. PREGNANCY: "Is there any chance you are pregnant?" "When was your last menstrual period?"       n/a  Protocols used: LEG SWELLING AND EDEMA-A-AH

## 2020-03-05 NOTE — ED Notes (Signed)
Pt with multiple complaints of pain to legs, back and neck. Sore on left shin since October was being treated as a spider bite with antibiotic ointment. Also Right foot she dropped a TV on it and it is bruised and painful. Afebrile

## 2020-03-05 NOTE — ED Notes (Signed)
Follow up pcp info provided all questions answered, rx provided to patient.

## 2020-03-05 NOTE — ED Provider Notes (Signed)
[facility] Emergency Department Provider Note  ____________________________________________  Time seen: Approximately 11:01 PM  I have reviewed the triage vital signs and the nursing notes.   HISTORY  Chief Complaint Leg Swelling    HPI Michele Moore is a 70 y.o. female who presents the emergency department complaining of injury to the right foot, ongoing skin finding to the left leg, sinus pressure.  According to the patient, she had a spider bite to the left lower extremity that has continued to have skin changes.  No increased pain, erythema, edema to the area.  Patient states that this skin finding is keeping her from having her knee replacement to the left knee.  Patient does have a history of psoriasis, psoriatic arthritis but states that this lesion does not appear consistent with her previous psoriasis outbreaks.  Patient states that she had seen a spider crawling up her leg prior to the onset of symptoms.  Again this started 5 months ago. Patient also is concerned about right foot pain, swelling and ecchymosis.  She states that she dropped a large box type TV on her foot and has had pain, swelling and ecchymosis since.  There is no extension into the calf or lower leg.  Patient is also requesting treatment for possible sinus infection as she has had sinus congestion, increased vertigo symptoms.  She takes meclizine for vertigo and states that she cannot take Flonase as it caused her to have nosebleeds.  Thinks that she has a sinus infection at this time.  No fevers or chills.  No cough.  No shortness of breath.         Past Medical History:  Diagnosis Date   Allergy    Anemia    Arthritis    Asthma    Cataract    Chronic kidney disease    STAGE 3 PER DR EASON 08/02/15   Collagen vascular disease ([place])    Complication of anesthesia    WOKE UP DURING COLONOSCOPY   Dyspnea    Dysrhythmia    IRREGULAR HEART BEAT   Family history of adverse  reaction to anesthesia    sister difficult to put to sleep   GERD (gastroesophageal reflux disease)    Glaucoma    H/O tooth extraction    all lower teeth 1/19   Hemorrhoid    History of hiatal hernia    History of kidney stones    H/O   Hypertension    Hypothyroidism    Migraines    Mixed hyperlipidemia    Multiple gastric ulcers    Thyroid disease    Wears dentures    partial upper and lower    Patient Active Problem List   Diagnosis Date Noted   Lumbar spondylosis 02/16/2020   Arthritis of left knee 11/24/2019   Left hip pain 08/08/2019   Primary osteoarthritis of left hip 08/08/2019   Non-seasonal allergic rhinitis 04/12/2019   Benign essential hypertension 03/06/2019   Proteinuria 03/06/2019   Trochanteric bursitis of left hip 10/04/2018   SI joint arthritis (left) 10/04/2018   Exertional chest pain 09/30/2018   CKD (chronic kidney disease), stage III ([place]) 09/30/2018   Psoriatic arthritis ([place]) 04/23/2018   Trigger ring finger of left hand 04/14/2018   Female pelvic pain 01/20/2018   History of revision of total knee arthroplasty 01/20/2018   Iron deficiency anemia    Heme + stool    Acute gastritis without hemorrhage    Gastric polyps  Benign neoplasm of ascending colon    Benign neoplasm of transverse colon    Polyp of sigmoid colon    Hypertension 10/01/2017   Hypothyroidism 10/01/2017   Gastroesophageal reflux disease 10/01/2017   Hypokalemia 10/01/2017   Mixed hyperlipidemia 10/01/2017   Reactive airway disease 10/01/2017   Bradycardia 08/24/2017   Severe obesity (BMI 35.0-35.9 with comorbidity) ([place]) 08/18/2017   Chronic gouty arthropathy without tophi 06/25/2017   Encounter for long-term (current) use of high-risk medication 06/25/2017   Positive ANA (antinuclear antibody) 06/17/2017   Cervical spondylosis without myelopathy 06/16/2017   Osteoarthritis of left shoulder due to rotator cuff injury  06/16/2017   Sprain of anterior talofibular ligament of right ankle 06/16/2017   Lumbar radiculopathy 04/21/2017   Lumbar degenerative disc disease 04/21/2017   Chronic pain syndrome 04/21/2017   Spinal stenosis, lumbar region, with neurogenic claudication 04/21/2017   SS-A antibody positive 03/05/2017   Chronic pain of left knee 08/03/2015   DOE (dyspnea on exertion) 12/28/2013    Past Surgical History:  Procedure Laterality Date   ANKLE SURGERY     CARPAL TUNNEL RELEASE     x3   COLONOSCOPY  2000?   COLONOSCOPY WITH PROPOFOL N/A 10/22/2017   Procedure: COLONOSCOPY WITH PROPOFOL;  Surgeon: Lucilla Lame, MD;  Location: [place];  Service: Endoscopy;  Laterality: N/A;   ESOPHAGOGASTRODUODENOSCOPY (EGD) WITH PROPOFOL N/A 10/22/2017   Procedure: ESOPHAGOGASTRODUODENOSCOPY (EGD) WITH PROPOFOL;  Surgeon: Lucilla Lame, MD;  Location: [place];  Service: Endoscopy;  Laterality: N/A;   FUSION OF TALONAVICULAR JOINT Right 08/03/2015   Procedure: TAILOR NAVICULAR JOINT FUSION - RIGHT ;  Surgeon: [name], DPM;  Location: [facility];  Service: Podiatry;  Laterality: Right;   JOINT REPLACEMENT Right    knee x 3 ,right x1 and left x2   POLYPECTOMY N/A 10/22/2017   Procedure: POLYPECTOMY;  Surgeon: Lucilla Lame, MD;  Location: [place];  Service: Endoscopy;  Laterality: N/A;   ROTATOR CUFF REPAIR     TONSILLECTOMY     TOTAL KNEE REVISION Right 01/20/2018   Procedure: POLYETHYLENE EXCHANGE;  Surgeon: Dereck Leep, MD;  Location: [facility];  Service: Orthopedics;  Laterality: Right;   TUBAL LIGATION     UPPER GI ENDOSCOPY  2000?    Prior to Admission medications   Medication Sig Start Date End Date Taking? Authorizing Provider  azithromycin (ZITHROMAX Z-PAK) 250 MG tablet Take 2 tablets (500 mg) on  Day 1,  followed by 1 tablet (250 mg) once daily on Days 2 through 5. 03/05/20  Yes Addasyn Mcbreen, Charline Bills, PA-C  predniSONE (DELTASONE) 50 MG  tablet Take 1 tablet (50 mg total) by mouth daily with breakfast. 03/05/20  Yes Laquanta Hummel, Charline Bills, PA-C  albuterol (VENTOLIN HFA) 108 (90 Base) MCG/ACT inhaler Inhale 2 puffs into the lungs every 4 (four) hours as needed for wheezing. 04/12/19   Juline Patch, MD  allopurinol (ZYLOPRIM) 100 MG tablet Take 200 mg by mouth every morning.  08/06/17   [provider]  ASPIRIN 81 PO Take 81 mg by mouth. Patient not taking: Reported on 02/15/2020    [provider]  betamethasone dipropionate 0.05 % cream APPLY TO PSORIASIS ON HANDS TWICE DAILY ONLY. AVOID FACE, [place] AXILLA Patient taking differently: as needed. 05/30/19   Ralene Bathe, MD  Cholecalciferol (VITAMIN D3) 2000 units TABS Take 2,000 Units by mouth daily.    [provider]  diclofenac sodium (VOLTAREN) 1 % GEL Apply 2 g topically  3 (three) times daily.  08/11/17   [provider]  ferrous sulfate 325 (65 FE) MG tablet Take 325 mg by mouth daily with breakfast.     [provider]  gabapentin (NEURONTIN) 600 MG tablet Take 1 tablet (600 mg total) by mouth at bedtime. 11/24/19 05/22/20  Gillis Santa, MD  hydrALAZINE (APRESOLINE) 50 MG tablet TAKE 1 TABLET(50 MG) BY MOUTH TWICE DAILY 12/08/19   Juline Patch, MD  HYDROcodone-acetaminophen (NORCO) 7.5-325 MG tablet Take 1 tablet by mouth every 8 (eight) hours as needed for severe pain. Must last 30 days. 02/28/20 03/29/20  Gillis Santa, MD  HYDROcodone-acetaminophen (NORCO) 7.5-325 MG tablet Take 1 tablet by mouth every 8 (eight) hours as needed for severe pain. Must last 30 days. 03/29/20 04/28/20  Gillis Santa, MD  HYDROcodone-acetaminophen (NORCO) 7.5-325 MG tablet Take 1 tablet by mouth every 8 (eight) hours as needed for severe pain. Must last 30 days. 04/28/20 05/28/20  Gillis Santa, MD  ketoconazole (NIZORAL) 2 % cream daily as needed.  11/26/18   [provider]  losartan (COZAAR) 25 MG tablet Take 1 tablet (25 mg total) by mouth every  morning. 12/08/19   Juline Patch, MD  meclizine (ANTIVERT) 12.5 MG tablet Take 1 tablet (12.5 mg total) by mouth 3 (three) times daily as needed for dizziness. 12/08/19   Juline Patch, MD  methotrexate (RHEUMATREX) 2.5 MG tablet Take 20 mg by mouth every Friday. Take 5 tablets by mouth once weekly Patient not taking: No sig reported 09/29/18   [provider]  Multiple Vitamins-Calcium (ONE-A-DAY WOMENS FORMULA PO) Take 1 tablet by mouth daily.    [provider]  mupirocin ointment (BACTROBAN) 2 % Apply 1 application topically 2 (two) times daily. 12/08/19   Juline Patch, MD  omega-3 acid ethyl esters (LOVAZA) 1 g capsule TAKE 1 CAPSULE BY MOUTH TWICE DAILY Patient not taking: No sig reported 10/29/19   Juline Patch, MD  omeprazole (PRILOSEC) 40 MG capsule Take 1 capsule (40 mg total) by mouth daily. 12/08/19   Juline Patch, MD  Polyethyl Glycol-Propyl Glycol (SYSTANE OP) Place 1 drop into both eyes daily as needed (dry eyes).     [provider]  potassium chloride SA (KLOR-CON) 20 MEQ tablet Take 0.5 tablets (10 mEq total) by mouth daily. 12/08/19   Juline Patch, MD  sulfamethoxazole-trimethoprim (BACTRIM DS) 800-160 MG tablet Take 1 tablet by mouth 2 (two) times daily. 12/08/19   Juline Patch, MD  SYNTHROID 175 MCG tablet TAKE 1 TABLET BY MOUTH EVERY DAY 01/31/20   Juline Patch, MD  tiZANidine (ZANAFLEX) 4 MG tablet Take 1 tablet (4 mg total) by mouth 2 (two) times daily as needed for muscle spasms. Patient taking differently: Take 4 mg by mouth at bedtime. 08/25/19   Gillis Santa, MD  triamcinolone cream (KENALOG) 0.1 % Apply 1 application topically every morning. Patient not taking: No sig reported    [provider]  triamterene-hydrochlorothiazide (MAXZIDE) 75-50 MG tablet Take 1 tablet by mouth daily. 12/08/19   Juline Patch, MD  fluticasone (FLONASE) 50 MCG/ACT nasal spray SHAKE LIQUID AND USE 1 SPRAY IN [facility] NOSTRIL DAILY 02/28/19  04/24/19  Juline Patch, MD  mometasone (NASONEX) 50 MCG/ACT nasal spray Place 2 sprays into the nose daily. 04/12/19 04/24/19  Juline Patch, MD    Allergies Ampicillin, Penicillins, Vancomycin, and Vibramycin [doxycycline calcium]  Family History  Problem Relation Age of Onset   Heart  failure Mother    Gout Mother    Arthritis Mother    Hypertension Mother    Stroke Mother    Heart failure Father    Diabetes Father    Hyperlipidemia Father    COPD Sister    Cancer Maternal Aunt        breast   Cancer Maternal Uncle        kidney   Breast cancer Neg Hx     Social History Social History   Tobacco Use   Smoking status: Former Smoker    Packs/day: 2.00    Years: 20.00    Pack years: 40.00    Types: Cigarettes    Quit date: 02/24/1993    Years since quitting: 27.0   Smokeless tobacco: Never Used  Scientific laboratory technician Use: Never used  Substance Use Topics   Alcohol use: No    Alcohol/week: 0.0 standard drinks   Drug use: No     Review of Systems  Constitutional: No fever/chills Eyes: No visual changes. No discharge ENT: Positive for nasal congestion sinus pressure Cardiovascular: no chest pain. Respiratory: no cough. No SOB. Gastrointestinal: No abdominal pain.  No nausea, no vomiting.  No diarrhea.  No constipation. Musculoskeletal: Positive for injury, pain, ecchymosis and edema to the right foot Skin: Negative for rash, abrasions, lacerations, ecchymosis.  Ongoing lesion to the left lower extremity that she states is from a spider bite Neurological: Negative for headaches, focal weakness or numbness.  10 System ROS otherwise negative.  ____________________________________________   PHYSICAL EXAM:  VITAL SIGNS: ED Triage Vitals  Enc Vitals Group     BP 03/05/20 1845 (!) 162/97     Pulse Rate 03/05/20 1845 79     Resp 03/05/20 1845 18     Temp 03/05/20 1845 98.2 F (36.8 C)     Temp Source 03/05/20 2143 Oral     SpO2 03/05/20 1845  100 %     Weight 03/05/20 1846 264 lb (119.7 kg)     Height 03/05/20 1846 5\' 11"  (1.803 m)     Head Circumference --      Peak Flow --      Pain Score 03/05/20 1845 10     Pain Loc --      Pain Edu? --      Excl. in [place]? --      Constitutional: Alert and oriented. Well appearing and in no acute distress. Eyes: Conjunctivae are normal. PERRL. EOMI. Head: Atraumatic. ENT:      Ears:       Nose: No congestion/rhinnorhea.      Mouth/Throat: Mucous membranes are moist.  Neck: No stridor.    Cardiovascular: Normal rate, regular rhythm. Normal S1 and S2.  Good peripheral circulation. Respiratory: Normal respiratory effort without tachypnea or retractions. Lungs CTAB. Good air entry to the bases with no decreased or absent breath sounds. Musculoskeletal: Full range of motion to all extremities. No gross deformities appreciated.  Visualization of the right foot reveals ecchymosis, edema but no open wounds.  This is consistent with contusion with no deformity.  Patient does have limited range of motion due to previous surgeries.  Diffuse tenderness throughout the midfoot without palpable abnormality.  Sensation and capillary refill intact all digits. Neurologic:  Normal speech and language. No gross focal neurologic deficits are appreciated.  Skin:  Skin is warm, dry and intact. No rash noted.  Examination of the left leg reveals a circular lesion Psychiatric: Mood and affect are  normal. Speech and behavior are normal. Patient exhibits appropriate insight and judgement.   ____________________________________________   LABS (all labs ordered are listed, but only abnormal results are displayed)  Labs Reviewed - No data to display ____________________________________________  EKG   ____________________________________________  RADIOLOGY I personally viewed and evaluated these images as part of my medical decision making, as well as reviewing the written report by the radiologist.  ED  Provider Interpretation: No evidence of fracture or other acute abnormality on x-ray of the left foot.  US Venous Img Lower Unilateral Left  Result Date: 03/05/2020 CLINICAL DATA:  Left leg swelling for 3 months. EXAM: LEFT LOWER EXTREMITY VENOUS DOPPLER ULTRASOUND TECHNIQUE: Gray-scale sonography with compression, as well as color and duplex ultrasound, were performed to evaluate the deep venous system(s) from the level of the common femoral vein through the popliteal and proximal calf veins. COMPARISON:  None. FINDINGS: VENOUS Normal compressibility of the common femoral, superficial femoral, and popliteal veins, as well as the visualized calf veins. Visualized portions of profunda femoral vein and great saphenous vein unremarkable. No filling defects to suggest DVT on grayscale or color Doppler imaging. Doppler waveforms show normal direction of venous flow, normal respiratory plasticity and response to augmentation. Limited views of the contralateral common femoral vein are unremarkable. OTHER None. Limitations: none IMPRESSION: No evidence of left lower extremity DVT. Electronically Signed   By: Keith Rake M.D.   On: 03/05/2020 20:26   DG Foot Complete Right  Result Date: 03/05/2020 CLINICAL DATA:  Pain EXAM: RIGHT FOOT COMPLETE - 3+ VIEW COMPARISON:  None. FINDINGS: There is soft tissue swelling about the foot. There is osteopenia which limits detection of nondisplaced fractures. There is no definite acute displaced fracture or dislocation. Patient has undergone prior midfoot arthrodesis. Hardware appears unchanged from prior study. Again noted are advanced midfoot degenerative changes. There is probable pes planus. IMPRESSION: 1. Soft tissue swelling without evidence for an acute osseous abnormality. 2. Stable postsurgical and degenerative changes of the foot as detailed above. Electronically Signed   By: Constance Holster M.D.   On: 03/05/2020 20:02     ____________________________________________    PROCEDURES  Procedure(s) performed:    Procedures    Medications  azithromycin (ZITHROMAX) tablet 500 mg (500 mg Oral Given 03/05/20 2348)  HYDROcodone-acetaminophen (NORCO/VICODIN) 5-325 MG per tablet 1 tablet (1 tablet Oral Given 03/05/20 2348)  predniSONE (DELTASONE) tablet 60 mg (60 mg Oral Given 03/05/20 2348)     ____________________________________________   INITIAL IMPRESSION / ASSESSMENT AND PLAN / ED COURSE  Pertinent labs & imaging results that were available during my care of the patient were reviewed by me and considered in my medical decision making (see chart for details).  Review of the [place] CSRS was performed in accordance of the [place] prior to dispensing any controlled drugs.           Patient's diagnosis is consistent with foot contusion, sinusitis.  Patient presented to the emergency department complaining of bilateral lower extremity complaints, sinus pressure/possible infection.  X-ray revealed no evidence of acute fracture to the right foot.  Findings on physical exam are consistent with foot contusion.  Patient is unable to take NSAIDs due to chronic kidney disease, is on chronic pain medicine currently.  I will place the patient on a short course of steroid for symptom control.  Patient also is complaining of sinus congestion and sinus pressure.  Feel that this is likely not bacterial sinusitis, however patient states that this is consistent  with her previous sinus infections.  As such we will cover with antibiotics but she is allergic to penicillins, doxycycline so I will use azithromycin.  Follow-up primary care as needed patient is given ED precautions to return to the ED for any worsening or new symptoms.     ____________________________________________  FINAL CLINICAL IMPRESSION(S) / ED DIAGNOSES  Final diagnoses:  Contusion of right foot, initial encounter  Acute maxillary sinusitis, recurrence  not specified      NEW MEDICATIONS STARTED DURING THIS VISIT:  ED Discharge Orders         Ordered    predniSONE (DELTASONE) 50 MG tablet  Daily with breakfast        03/05/20 2322    azithromycin (ZITHROMAX Z-PAK) 250 MG tablet        03/05/20 2322              This chart was dictated using voice recognition software/Dragon. Despite best efforts to proofread, errors can occur which can change the meaning. Any change was purely unintentional.    Darletta Moll, PA-C 03/05/20 2354    Vanessa [place], MD 03/06/20 [phone]

## 2020-03-05 NOTE — ED Triage Notes (Signed)
Pt to ER with c/o leg swelling and was sent by PCP.  Pt states she has been being treated for an infection of left shin since October and her doctor sent her for an [place] because of the swelling.  Also states she needs x ray of right foot due to bruising and swelling to foot due to dropping a TV on it Saturday.  Left shin with noted area of scabbing and pink skin, no noted active infection.

## 2020-03-06 NOTE — Telephone Encounter (Signed)
Left a message on pt's voicemail stating to go to ER or urgent care setting for evaluation of foot as well as possibly a wound culture due to her vascular issues

## 2020-03-07 ENCOUNTER — Ambulatory Visit: Payer: Medicare Other | Admitting: Student in an Organized Health Care Education/Training Program

## 2020-03-12 ENCOUNTER — Ambulatory Visit: Payer: Medicare Other

## 2020-03-14 ENCOUNTER — Ambulatory Visit (INDEPENDENT_AMBULATORY_CARE_PROVIDER_SITE_OTHER): Payer: Medicare Other | Admitting: Dermatology

## 2020-03-14 ENCOUNTER — Other Ambulatory Visit: Payer: Self-pay

## 2020-03-14 DIAGNOSIS — D692 Other nonthrombocytopenic purpura: Secondary | ICD-10-CM

## 2020-03-14 DIAGNOSIS — L409 Psoriasis, unspecified: Secondary | ICD-10-CM | POA: Diagnosis not present

## 2020-03-14 MED ORDER — BETAMETHASONE VALERATE 0.1 % EX CREA: TOPICAL_CREAM | Freq: Two times a day (BID) | CUTANEOUS | 3 refills | Status: DC | PRN

## 2020-03-14 NOTE — Progress Notes (Signed)
   Follow-Up Visit   Subjective  Michele Moore is a 70 y.o. female who presents for the following: Other (Spot of left lower leg x a few months. Waiting on a knee replacement and can not have it done until place is cleared up). She traumatized foot resulting in bruising.  The following portions of the chart were reviewed this encounter and updated as appropriate:   Tobacco  Allergies  Meds  Problems  Med Hx  Surg Hx  Fam Hx     Review of Systems:  No other skin or systemic complaints except as noted in HPI or Assessment and Plan.  Objective  Well appearing patient in no apparent distress; mood and affect are within normal limits.  A focused examination was performed including left lower leg, right foot. Relevant physical exam findings are noted in the Assessment and Plan.  Objective  Left Lower Leg - Anterior: 4.0 x 3.8 cm Scaly pink plaque  Images    Objective  Right Foot - Anterior: Purpura   Assessment & Plan  Psoriasis Left Lower Leg - Anterior Start Betamethasone 0.1% cream bid  Psoriasis is a chronic non-curable, but treatable genetic/hereditary disease that may have other systemic features affecting other organ systems such as joints (Psoriatic Arthritis). It is associated with an increased risk of inflammatory bowel disease, heart disease, non-alcoholic fatty liver disease, and depression.    Other Related Medications betamethasone valerate (VALISONE) 0.1 % cream  Other nonthrombocytopenic purpura (HCC) Right Foot - Anterior From trauma.  Treat as per PCP. Advised patient will continue to heal  Return in about 2 months (around 05/12/2020).  I, Ashok Cordia, CMA, am acting as scribe for Sarina Ser, MD .  Documentation: I have reviewed the above documentation for accuracy and completeness, and I agree with the above.  Sarina Ser, MD

## 2020-03-15 ENCOUNTER — Other Ambulatory Visit: Payer: Self-pay

## 2020-03-15 DIAGNOSIS — L409 Psoriasis, unspecified: Secondary | ICD-10-CM

## 2020-03-15 MED ORDER — BETAMETHASONE VALERATE 0.1 % EX CREA: TOPICAL_CREAM | Freq: Two times a day (BID) | CUTANEOUS | 3 refills | Status: DC | PRN

## 2020-03-15 NOTE — Progress Notes (Signed)
Change of pharmacy per patient request.  

## 2020-03-19 ENCOUNTER — Encounter: Payer: Self-pay | Admitting: Dermatology

## 2020-03-19 ENCOUNTER — Ambulatory Visit (INDEPENDENT_AMBULATORY_CARE_PROVIDER_SITE_OTHER): Payer: Medicare Other

## 2020-03-19 DIAGNOSIS — Z0000 Encounter for general adult medical examination without abnormal findings: Secondary | ICD-10-CM | POA: Diagnosis not present

## 2020-03-19 DIAGNOSIS — Z1231 Encounter for screening mammogram for malignant neoplasm of breast: Secondary | ICD-10-CM | POA: Diagnosis not present

## 2020-03-19 NOTE — Progress Notes (Signed)
Subjective:   Michele Moore is a 70 y.o. female who presents for Medicare Annual (Subsequent) preventive examination.  Virtual Visit via Telephone Note  I connected with  Michele Moore on 03/19/20 at  2:40 PM EST by telephone and verified that I am speaking with the correct person using two identifiers.  Location: Patient: home Provider: [facility] Persons participating in the virtual visit: [name]   I discussed the limitations, risks, security and privacy concerns of performing an evaluation and management service by telephone and the availability of in person appointments. The patient expressed understanding and agreed to proceed.  Interactive audio and video telecommunications were attempted between this nurse and patient, however failed, due to patient having technical difficulties OR patient did not have access to video capability.  We continued and completed visit with audio only.  Some vital signs may be absent or patient reported.   [name], LPN    Review of Systems     Cardiac Risk Factors include: advanced age (>66men, >32 women);hypertension;dyslipidemia;obesity (BMI >30kg/m2);sedentary lifestyle     Objective:    Today's Vitals   03/19/20 1445  PainSc: 10-Worst pain ever   There is no height or weight on file to calculate BMI.  Advanced Directives 03/19/2020 03/05/2020 08/27/2019 06/21/2019 04/24/2019 03/09/2019 09/30/2018  Does Patient Have a Medical Advance Directive? No No No No No No No  Would patient like information on creating a medical advance directive? No - Patient declined No - Patient declined No - Patient declined No - Guardian declined - No - Patient declined No - Patient declined    Current Medications (verified) Outpatient Encounter Medications as of 03/19/2020  Medication Sig  . albuterol (VENTOLIN HFA) 108 (90 Base) MCG/ACT inhaler Inhale 2 puffs into the lungs every 4 (four) hours as needed for wheezing.  Marland Kitchen allopurinol (ZYLOPRIM)  100 MG tablet Take 200 mg by mouth every morning.   Marland Kitchen aspirin EC 81 MG tablet Take 81 mg by mouth daily. Swallow whole.  . betamethasone dipropionate 0.05 % cream APPLY TO PSORIASIS ON HANDS TWICE DAILY ONLY. AVOID FACE, GROIN AND AXILLA (Patient taking differently: as needed.)  . betamethasone valerate (VALISONE) 0.1 % cream Apply topically 2 (two) times daily as needed (Rash).  . Cholecalciferol (VITAMIN D3) 2000 units TABS Take 2,000 Units by mouth daily.  . diclofenac sodium (VOLTAREN) 1 % GEL Apply 2 g topically 3 (three) times daily.   . ferrous sulfate 325 (65 FE) MG tablet Take 325 mg by mouth daily with breakfast.   . gabapentin (NEURONTIN) 600 MG tablet Take 1 tablet (600 mg total) by mouth at bedtime.  . hydrALAZINE (APRESOLINE) 50 MG tablet TAKE 1 TABLET(50 MG) BY MOUTH TWICE DAILY  . HYDROcodone-acetaminophen (NORCO) 7.5-325 MG tablet Take 1 tablet by mouth every 8 (eight) hours as needed for severe pain. Must last 30 days.  Marland Kitchen ketoconazole (NIZORAL) 2 % cream daily as needed.   Marland Kitchen losartan (COZAAR) 25 MG tablet Take 1 tablet (25 mg total) by mouth every morning.  . meclizine (ANTIVERT) 12.5 MG tablet Take 1 tablet (12.5 mg total) by mouth 3 (three) times daily as needed for dizziness.  . methotrexate (RHEUMATREX) 2.5 MG tablet Take 20 mg by mouth every Friday. Take 5 tablets by mouth once weekly  . Multiple Vitamins-Calcium (ONE-A-DAY WOMENS FORMULA PO) Take 1 tablet by mouth daily.  . mupirocin ointment (BACTROBAN) 2 % Apply 1 application topically 2 (two) times daily.  Marland Kitchen omega-3 acid ethyl esters (  LOVAZA) 1 g capsule TAKE 1 CAPSULE BY MOUTH TWICE DAILY  . omeprazole (PRILOSEC) 40 MG capsule Take 1 capsule (40 mg total) by mouth daily.  Vladimir Faster Glycol-Propyl Glycol (SYSTANE OP) Place 1 drop into both eyes daily as needed (dry eyes).   . potassium chloride SA (KLOR-CON) 20 MEQ tablet Take 0.5 tablets (10 mEq total) by mouth daily.  Marland Kitchen SYNTHROID 175 MCG tablet TAKE 1 TABLET BY MOUTH  EVERY DAY  . tiZANidine (ZANAFLEX) 4 MG tablet Take 1 tablet (4 mg total) by mouth 2 (two) times daily as needed for muscle spasms. (Patient taking differently: Take 4 mg by mouth at bedtime.)  . triamcinolone cream (KENALOG) 0.1 % Apply 1 application topically every morning.  . triamterene-hydrochlorothiazide (MAXZIDE) 75-50 MG tablet Take 1 tablet by mouth daily.  . [DISCONTINUED] predniSONE (DELTASONE) 50 MG tablet Take 1 tablet (50 mg total) by mouth daily with breakfast.  . [DISCONTINUED] sulfamethoxazole-trimethoprim (BACTRIM DS) 800-160 MG tablet Take 1 tablet by mouth 2 (two) times daily.  Derrill Memo ON 03/29/2020] HYDROcodone-acetaminophen (NORCO) 7.5-325 MG tablet Take 1 tablet by mouth every 8 (eight) hours as needed for severe pain. Must last 30 days.  Derrill Memo ON 04/28/2020] HYDROcodone-acetaminophen (NORCO) 7.5-325 MG tablet Take 1 tablet by mouth every 8 (eight) hours as needed for severe pain. Must last 30 days.  . [DISCONTINUED] ASPIRIN 81 PO Take 81 mg by mouth. (Patient not taking: Reported on 02/15/2020)  . [DISCONTINUED] azithromycin (ZITHROMAX Z-PAK) 250 MG tablet Take 2 tablets (500 mg) on  Day 1,  followed by 1 tablet (250 mg) once daily on Days 2 through 5.  . [DISCONTINUED] fluticasone (FLONASE) 50 MCG/ACT nasal spray SHAKE LIQUID AND USE 1 SPRAY IN EACH NOSTRIL DAILY  . [DISCONTINUED] mometasone (NASONEX) 50 MCG/ACT nasal spray Place 2 sprays into the nose daily.   No facility-administered encounter medications on file as of 03/19/2020.    Allergies (verified) Ampicillin, Penicillins, Vancomycin, and Vibramycin [doxycycline calcium]   History: Past Medical History:  Diagnosis Date  . Allergy   . Anemia   . Arthritis   . Asthma   . Cataract   . Chronic kidney disease    STAGE 3 PER DR EASON 08/02/15  . Collagen vascular disease ([place])   . Complication of anesthesia    WOKE UP DURING COLONOSCOPY  . Dyspnea   . Dysrhythmia    IRREGULAR HEART BEAT  . Family history of  adverse reaction to anesthesia    sister difficult to put to sleep  . GERD (gastroesophageal reflux disease)   . Glaucoma   . H/O tooth extraction    all lower teeth 1/19  . Hemorrhoid   . History of hiatal hernia   . History of kidney stones    H/O  . Hypertension   . Hypothyroidism   . Migraines   . Mixed hyperlipidemia   . Multiple gastric ulcers   . Thyroid disease   . Wears dentures    partial upper and lower   Past Surgical History:  Procedure Laterality Date  . ANKLE SURGERY    . CARPAL TUNNEL RELEASE     x3  . COLONOSCOPY  2000?  . COLONOSCOPY WITH PROPOFOL N/A 10/22/2017   Procedure: COLONOSCOPY WITH PROPOFOL;  Surgeon: Lucilla Lame, MD;  Location: [place];  Service: Endoscopy;  Laterality: N/A;  . ESOPHAGOGASTRODUODENOSCOPY (EGD) WITH PROPOFOL N/A 10/22/2017   Procedure: ESOPHAGOGASTRODUODENOSCOPY (EGD) WITH PROPOFOL;  Surgeon: Lucilla Lame, MD;  Location: [place];  Service: Endoscopy;  Laterality: N/A;  . FUSION OF TALONAVICULAR JOINT Right 08/03/2015   Procedure: TAILOR NAVICULAR JOINT FUSION - RIGHT ;  Surgeon: [name], DPM;  Location: [facility];  Service: Podiatry;  Laterality: Right;  . JOINT REPLACEMENT Right    knee x 3 ,right x1 and left x2  . POLYPECTOMY N/A 10/22/2017   Procedure: POLYPECTOMY;  Surgeon: Lucilla Lame, MD;  Location: [place];  Service: Endoscopy;  Laterality: N/A;  . ROTATOR CUFF REPAIR    . TONSILLECTOMY    . TOTAL KNEE REVISION Right 01/20/2018   Procedure: POLYETHYLENE EXCHANGE;  Surgeon: Dereck Leep, MD;  Location: [facility];  Service: Orthopedics;  Laterality: Right;  . TUBAL LIGATION    . UPPER GI ENDOSCOPY  2000?   Family History  Problem Relation Age of Onset  . Heart failure Mother   . Gout Mother   . Arthritis Mother   . Hypertension Mother   . Stroke Mother   . Heart failure Father   . Diabetes Father   . Hyperlipidemia Father   . COPD Sister   . Cancer Maternal Aunt         breast  . Cancer Maternal Uncle        kidney  . Breast cancer Neg Hx    Social History   Socioeconomic History  . Marital status: Divorced    Spouse name: Not on file  . Number of children: 4  . Years of education: Not on file  . Highest education level: 9th grade  Occupational History  . Occupation: retired  Tobacco Use  . Smoking status: Former Smoker    Packs/day: 2.00    Years: 20.00    Pack years: 40.00    Types: Cigarettes    Quit date: 02/24/1993    Years since quitting: 27.0  . Smokeless tobacco: Never Used  Vaping Use  . Vaping Use: Never used  Substance and Sexual Activity  . Alcohol use: No    Alcohol/week: 0.0 standard drinks  . Drug use: No  . Sexual activity: Not Currently  Other Topics Concern  . Not on file  Social History Narrative  . Not on file   Social Determinants of Health   Financial Resource Strain: Low Risk   . Difficulty of Paying Living Expenses: Not very hard  Food Insecurity: No Food Insecurity  . Worried About Charity fundraiser in the Last Year: Never true  . Ran Out of Food in the Last Year: Never true  Transportation Needs: No Transportation Needs  . Lack of Transportation (Medical): No  . Lack of Transportation (Non-Medical): No  Physical Activity: Inactive  . Days of Exercise per Week: 0 days  . Minutes of Exercise per Session: 0 min  Stress: No Stress Concern Present  . Feeling of Stress : Not at all  Social Connections: Moderately Isolated  . Frequency of Communication with Friends and Family: More than three times a week  . Frequency of Social Gatherings with Friends and Family: More than three times a week  . Attends Religious Services: More than 4 times per year  . Active Member of Clubs or Organizations: No  . Attends Archivist Meetings: Never  . Marital Status: Divorced    Tobacco Counseling Counseling given: Not Answered   Clinical Intake:  Pre-visit preparation completed: Yes  Pain : 0-10 Pain  Score: 10-Worst pain ever Pain Location: Genitalia Pain Descriptors / Indicators: Aching,Sore Pain Onset: More than a month  ago Pain Frequency: Constant     Nutritional Status: BMI > 30  Obese Nutritional Risks: None Diabetes: No  How often do you need to have someone help you when you read instructions, pamphlets, or other written materials from your doctor or pharmacy?: 1 - Never    Interpreter Needed?: No  Information entered by :: [name] LPN   Activities of Daily Living In your present state of health, do you have any difficulty performing the following activities: 03/19/2020 12/07/2019  Hearing? Y N  Comment declines hearing aids -  Vision? N N  Difficulty concentrating or making decisions? N N  Walking or climbing stairs? Y Y  Dressing or bathing? N N  Doing errands, shopping? N N  Preparing Food and eating ? N -  Using the Toilet? N -  In the past six months, have you accidently leaked urine? N -  Do you have problems with loss of bowel control? N -  Managing your Medications? N -  Managing your Finances? N -  Housekeeping or managing your Housekeeping? N -  Some recent data might be hidden    Patient Care Team: Juline Patch, MD as PCP - General (Family Medicine) Christene Lye, MD (General Surgery) Anthonette Legato, MD (Nephrology) Gillis Santa, MD as Consulting Physician (Pain Medicine) Marry Guan, Laurice Record, MD (Orthopedic Surgery)  Indicate any recent Medical Services you may have received from other than [facility] providers in the past year (date may be approximate).     Assessment:   This is a routine wellness examination for Brittne.  Hearing/Vision screen  Hearing Screening   125Hz  250Hz  500Hz  1000Hz  2000Hz  3000Hz  4000Hz  6000Hz  8000Hz   Right ear:           Left ear:           Comments: Pt has mild hearing difficulty  Vision Screening Comments: Annual vision screenings by [facility]  Dietary issues and exercise activities  discussed: Current Exercise Habits: The patient does not participate in regular exercise at present, Exercise limited by: orthopedic condition(s)  Goals    . Increase physical activity     Increase physical activity as tolerated.      Depression Screen PHQ 2/9 Scores 03/19/2020 12/08/2019 09/08/2019 04/25/2019 03/09/2019 02/04/2019 07/14/2018  PHQ - 2 Score 0 0 0 0 0 0 0  PHQ- 9 Score - 0 0 - - 0 0    Fall Risk Fall Risk  03/19/2020 12/08/2019 09/08/2019 09/08/2019 08/08/2019  Falls in the past year? 1 1 1  0 1  Number falls in past yr: 1 1 1  - 0  Injury with Fall? 0 0 - - 0  Comment - - - - -  Risk Factor Category  - - - - -  Risk for fall due to : History of fall(s);Impaired balance/gait;Impaired mobility;Orthopedic patient - Impaired balance/gait Impaired balance/gait Impaired balance/gait  Risk for fall due to: Comment - - - - -  Follow up - Falls evaluation completed Falls evaluation completed Falls evaluation completed Education provided;Falls prevention discussed    FALL RISK PREVENTION PERTAINING TO THE HOME:  Any stairs in or around the home? No  If so, are there any without handrails? No  Home free of loose throw rugs in walkways, pet beds, electrical cords, etc? Yes  Adequate lighting in your home to reduce risk of falls? Yes   ASSISTIVE DEVICES UTILIZED TO PREVENT FALLS:  Life alert? No  Use of a cane, walker or w/c? Yes  Grab bars in the bathroom? No  Shower chair or bench in shower? Yes  Elevated toilet seat or a handicapped toilet? Yes   TIMED UP AND GO:  Was the test performed? No . Telephonic visit.   Cognitive Function: Normal cognitive status assessed by direct observation by this Nurse Health Advisor. No abnormalities found.       6CIT Screen 03/09/2019 03/01/2018  What Year? 0 points 0 points  What month? 0 points 0 points  What time? 0 points 0 points  Count back from 20 0 points 0 points  Months in reverse 0 points 0 points  Repeat phrase 0 points 2  points  Total Score 0 2    Immunizations Immunization History  Administered Date(s) Administered  . Fluad Quad(high Dose 65+) 10/19/2018, 12/08/2019  . Influenza, High Dose Seasonal PF 12/23/2016  . Influenza-Unspecified 11/07/2014, 12/23/2016  . PFIZER(Purple Top)SARS-COV-2 Vaccination 05/20/2019, 06/17/2019  . Pneumococcal Conjugate-13 11/07/2014  . Pneumococcal Polysaccharide-23 12/23/2016  . Zoster 11/07/2014    TDAP status: Due, Education has been provided regarding the importance of this vaccine. Advised may receive this vaccine at local pharmacy or Health Dept. Aware to provide a copy of the vaccination record if obtained from local pharmacy or Health Dept. Verbalized acceptance and understanding.  Flu Vaccine status: Up to date  Pneumococcal vaccine status: Up to date  Covid-19 vaccine status: Completed vaccines  Qualifies for Shingles Vaccine? Yes   Zostavax completed Yes   Shingrix Completed?: No.    Education has been provided regarding the importance of this vaccine. Patient has been advised to call insurance company to determine out of pocket expense if they have not yet received this vaccine. Advised may also receive vaccine at local pharmacy or Health Dept. Verbalized acceptance and understanding.  Screening Tests Health Maintenance  Topic Date Due  . TETANUS/TDAP  Never done  . COVID-19 Vaccine (3 - Booster for Pfizer series) 12/17/2019  . Hepatitis C Screening  12/07/2020 (Originally 1950-03-11)  . MAMMOGRAM  04/05/2021  . COLONOSCOPY (Pts 45-18yrs Insurance coverage will need to be confirmed)  10/23/2022  . INFLUENZA VACCINE  Completed  . DEXA SCAN  Completed  . PNA vac Low Risk Adult  Completed    Health Maintenance  Health Maintenance Due  Topic Date Due  . TETANUS/TDAP  Never done  . COVID-19 Vaccine (3 - Booster for Pfizer series) 12/17/2019    Colorectal cancer screening: Type of screening: Colonoscopy. Completed 10/22/17. Repeat every 5  years  Mammogram status: Completed 04/06/19. Repeat every year  Bone Density status: Completed 04/06/19. Results reflect: Bone density results: NORMAL. Repeat every 2 years.  Lung Cancer Screening: (Low Dose CT Chest recommended if Age 31-80 years, 30 pack-year currently smoking OR have quit w/in 15years.) does not qualify.    Additional Screening:  Hepatitis C Screening: does qualify; postponed  Vision Screening: Recommended annual ophthalmology exams for early detection of glaucoma and other disorders of the eye. Is the patient up to date with their annual eye exam?  Yes  Who is the provider or what is the name of the office in which the patient attends annual eye exams? [place] Screening: Recommended annual dental exams for proper oral hygiene  Community Resource Referral / Chronic Care Management: CRR required this visit?  No   CCM required this visit?  No      Plan:     I have personally reviewed and noted the following in the patient's chart:   .  Medical and social history . Use of alcohol, tobacco or illicit drugs  . Current medications and supplements . Functional ability and status . Nutritional status . Physical activity . Advanced directives . List of other physicians . Hospitalizations, surgeries, and ER visits in previous 12 months . Vitals . Screenings to include cognitive, depression, and falls . Referrals and appointments  In addition, I have reviewed and discussed with patient certain preventive protocols, quality metrics, and best practice recommendations. A written personalized care plan for preventive services as well as general preventive health recommendations were provided to patient.     [name], LPN   4/40/3474   Nurse Notes: pt c/o still having sxs and congestion from sinus infection. Pt has completed recent rx's given. Pt advised would need OV; scheduled for Thurs 1/27. Pt denies SOB, loss of taste or smell. Covid  screening questions asked.

## 2020-03-19 NOTE — Patient Instructions (Signed)
[name] , Thank you for taking time to come for your Medicare Wellness Visit. I appreciate your ongoing commitment to your health goals. Please review the following plan we discussed and let me know if I can assist you in the future.   Screening recommendations/referrals: Colonoscopy: done 10/22/17. Repeat in 2024 Mammogram: done 04/06/19. Please call [phone] to schedule your mammogram.  Bone Density: done 04/06/19 Recommended yearly ophthalmology/optometry visit for glaucoma screening and checkup Recommended yearly dental visit for hygiene and checkup  Vaccinations: Influenza vaccine: done 12/08/19 Pneumococcal vaccine: done 12/24/14 Tdap vaccine: due Shingles vaccine: Shingrix discussed. Please contact your pharmacy for coverage information.  Covid-19: done 05/20/19 & 06/17/19  Advanced directives: Advance directive discussed with you today. Even though you declined this today please call our office should you change your mind and we can give you the proper paperwork for you to fill out.  Conditions/risks identified: Recommend continuing fall prevention in the home  Next appointment: Follow up in one year for your annual wellness visit    Preventive Care 65 Years and Older, Female Preventive care refers to lifestyle choices and visits with your health care provider that can promote health and wellness. What does preventive care include?  A yearly physical exam. This is also called an annual well check.  Dental exams once or twice a year.  Routine eye exams. Ask your health care provider how often you should have your eyes checked.  Personal lifestyle choices, including:  Daily care of your teeth and gums.  Regular physical activity.  Eating a healthy diet.  Avoiding tobacco and drug use.  Limiting alcohol use.  Practicing safe sex.  Taking low-dose aspirin every day.  Taking vitamin and mineral supplements as recommended by your health care provider. What happens  during an annual well check? The services and screenings done by your health care provider during your annual well check will depend on your age, overall health, lifestyle risk factors, and family history of disease. Counseling  Your health care provider may ask you questions about your:  Alcohol use.  Tobacco use.  Drug use.  Emotional well-being.  Home and relationship well-being.  Sexual activity.  Eating habits.  History of falls.  Memory and ability to understand (cognition).  Work and work Statistician.  Reproductive health. Screening  You may have the following tests or measurements:  Height, weight, and BMI.  Blood pressure.  Lipid and cholesterol levels. These may be checked every 5 years, or more frequently if you are over 75 years old.  Skin check.  Lung cancer screening. You may have this screening every year starting at age 38 if you have a 30-pack-year history of smoking and currently smoke or have quit within the past 15 years.  Fecal occult blood test (FOBT) of the stool. You may have this test every year starting at age 60.  Flexible sigmoidoscopy or colonoscopy. You may have a sigmoidoscopy every 5 years or a colonoscopy every 10 years starting at age 79.  Hepatitis C blood test.  Hepatitis B blood test.  Sexually transmitted disease (STD) testing.  Diabetes screening. This is done by checking your blood sugar (glucose) after you have not eaten for a while (fasting). You may have this done every 1-3 years.  Bone density scan. This is done to screen for osteoporosis. You may have this done starting at age 67.  Mammogram. This may be done every 1-2 years. Talk to your health care provider about how often you should have  regular mammograms. Talk with your health care provider about your test results, treatment options, and if necessary, the need for more tests. Vaccines  Your health care provider may recommend certain vaccines, such  as:  Influenza vaccine. This is recommended every year.  Tetanus, diphtheria, and acellular pertussis (Tdap, Td) vaccine. You may need a Td booster every 10 years.  Zoster vaccine. You may need this after age 55.  Pneumococcal 13-valent conjugate (PCV13) vaccine. One dose is recommended after age 50.  Pneumococcal polysaccharide (PPSV23) vaccine. One dose is recommended after age 68. Talk to your health care provider about which screenings and vaccines you need and how often you need them. This information is not intended to replace advice given to you by your health care provider. Make sure you discuss any questions you have with your health care provider. Document Released: 03/09/2015 Document Revised: 10/31/2015 Document Reviewed: 12/12/2014 Elsevier Interactive Patient Education  2017 [employer] Prevention in the Home Falls can cause injuries. They can happen to people of all ages. There are many things you can do to make your home safe and to help prevent falls. What can I do on the outside of my home?  Regularly fix the edges of walkways and driveways and fix any cracks.  Remove anything that might make you trip as you walk through a door, such as a raised step or threshold.  Trim any bushes or trees on the path to your home.  Use bright outdoor lighting.  Clear any walking paths of anything that might make someone trip, such as rocks or tools.  Regularly check to see if handrails are loose or broken. Make sure that both sides of any steps have handrails.  Any raised decks and porches should have guardrails on the edges.  Have any leaves, snow, or ice cleared regularly.  Use sand or salt on walking paths during winter.  Clean up any spills in your garage right away. This includes oil or grease spills. What can I do in the bathroom?  Use night lights.  Install grab bars by the toilet and in the tub and shower. Do not use towel bars as grab bars.  Use  non-skid mats or decals in the tub or shower.  If you need to sit down in the shower, use a plastic, non-slip stool.  Keep the floor dry. Clean up any water that spills on the floor as soon as it happens.  Remove soap buildup in the tub or shower regularly.  Attach bath mats securely with double-sided non-slip rug tape.  Do not have throw rugs and other things on the floor that can make you trip. What can I do in the bedroom?  Use night lights.  Make sure that you have a light by your bed that is easy to reach.  Do not use any sheets or blankets that are too big for your bed. They should not hang down onto the floor.  Have a firm chair that has side arms. You can use this for support while you get dressed.  Do not have throw rugs and other things on the floor that can make you trip. What can I do in the kitchen?  Clean up any spills right away.  Avoid walking on wet floors.  Keep items that you use a lot in easy-to-reach places.  If you need to reach something above you, use a strong step stool that has a grab bar.  Keep electrical cords out of the  way.  Do not use floor polish or wax that makes floors slippery. If you must use wax, use non-skid floor wax.  Do not have throw rugs and other things on the floor that can make you trip. What can I do with my stairs?  Do not leave any items on the stairs.  Make sure that there are handrails on both sides of the stairs and use them. Fix handrails that are broken or loose. Make sure that handrails are as long as the stairways.  Check any carpeting to make sure that it is firmly attached to the stairs. Fix any carpet that is loose or worn.  Avoid having throw rugs at the top or bottom of the stairs. If you do have throw rugs, attach them to the floor with carpet tape.  Make sure that you have a light switch at the top of the stairs and the bottom of the stairs. If you do not have them, ask someone to add them for you. What  else can I do to help prevent falls?  Wear shoes that:  Do not have high heels.  Have rubber bottoms.  Are comfortable and fit you well.  Are closed at the toe. Do not wear sandals.  If you use a stepladder:  Make sure that it is fully opened. Do not climb a closed stepladder.  Make sure that both sides of the stepladder are locked into place.  Ask someone to hold it for you, if possible.  Clearly mark and make sure that you can see:  Any grab bars or handrails.  First and last steps.  Where the edge of each step is.  Use tools that help you move around (mobility aids) if they are needed. These include:  Canes.  Walkers.  Scooters.  Crutches.  Turn on the lights when you go into a dark area. Replace any light bulbs as soon as they burn out.  Set up your furniture so you have a clear path. Avoid moving your furniture around.  If any of your floors are uneven, fix them.  If there are any pets around you, be aware of where they are.  Review your medicines with your doctor. Some medicines can make you feel dizzy. This can increase your chance of falling. Ask your doctor what other things that you can do to help prevent falls. This information is not intended to replace advice given to you by your health care provider. Make sure you discuss any questions you have with your health care provider. Document Released: 12/07/2008 Document Revised: 07/19/2015 Document Reviewed: 03/17/2014 Elsevier Interactive Patient Education  2017 [employer].

## 2020-03-22 ENCOUNTER — Encounter: Payer: Self-pay | Admitting: Family Medicine

## 2020-03-22 ENCOUNTER — Ambulatory Visit (INDEPENDENT_AMBULATORY_CARE_PROVIDER_SITE_OTHER): Payer: Medicare Other | Admitting: Family Medicine

## 2020-03-22 ENCOUNTER — Other Ambulatory Visit: Payer: Self-pay

## 2020-03-22 VITALS — BP 148/80 | HR 68 | Temp 98.9°F | Ht 71.0 in | Wt 283.0 lb

## 2020-03-22 DIAGNOSIS — N2581 Secondary hyperparathyroidism of renal origin: Secondary | ICD-10-CM | POA: Diagnosis not present

## 2020-03-22 DIAGNOSIS — J069 Acute upper respiratory infection, unspecified: Secondary | ICD-10-CM | POA: Diagnosis not present

## 2020-03-22 DIAGNOSIS — L52 Erythema nodosum: Secondary | ICD-10-CM

## 2020-03-22 DIAGNOSIS — I10 Essential (primary) hypertension: Secondary | ICD-10-CM | POA: Diagnosis not present

## 2020-03-22 DIAGNOSIS — D631 Anemia in chronic kidney disease: Secondary | ICD-10-CM | POA: Diagnosis not present

## 2020-03-22 DIAGNOSIS — N184 Chronic kidney disease, stage 4 (severe): Secondary | ICD-10-CM | POA: Diagnosis not present

## 2020-03-22 MED ORDER — MONTELUKAST SODIUM 10 MG PO TABS: 10.0000 mg | ORAL_TABLET | Freq: Every day | ORAL | 3 refills | Status: DC

## 2020-03-22 MED ORDER — TRIAMCINOLONE ACETONIDE 0.1 % EX CREA: 1.0000 "application " | TOPICAL_CREAM | Freq: Two times a day (BID) | CUTANEOUS | 0 refills | Status: AC

## 2020-03-22 NOTE — Progress Notes (Signed)
Date:  03/22/2020   Name:  Michele Moore   DOB:  November 16, 1950   MRN:  [id number]   Chief Complaint: Sinusitis (Nose bleeding) and Rash (? Shingles above R) eye)  Sinusitis This is a chronic problem. The current episode started in the past 7 days. The problem has been gradually improving since onset. There has been no fever. The pain is mild. Associated symptoms include congestion. Pertinent negatives include no chills, coughing, ear pain, headaches, shortness of breath, sinus pressure, sneezing or sore throat. Past treatments include nothing.  Rash This is a new problem. The current episode started in the past 7 days. The problem has been gradually improving since onset. The affected locations include the head. The rash is characterized by redness. Associated symptoms include congestion. Pertinent negatives include no cough, diarrhea, eye pain, fatigue, fever, rhinorrhea, shortness of breath, sore throat or vomiting.  URI  This is a new problem. The current episode started in the past 7 days. The problem has been gradually improving. There has been no fever. Associated symptoms include congestion and a rash. Pertinent negatives include no abdominal pain, coughing, diarrhea, dysuria, ear pain, headaches, nausea, rhinorrhea, sneezing, sore throat, vomiting or wheezing.    Lab Results  Component Value Date   CREATININE 1.71 (H) 12/07/2019   BUN 30 (H) 12/07/2019   NA 141 12/07/2019   K 4.2 12/07/2019   CL 104 12/07/2019   CO2 26 12/07/2019   Lab Results  Component Value Date   CHOL 199 04/12/2019   HDL 84 04/12/2019   LDLCALC 102 (H) 04/12/2019   TRIG 69 04/12/2019   CHOLHDL 4.2 10/01/2018   Lab Results  Component Value Date   TSH 0.240 (L) 12/08/2019   Lab Results  Component Value Date   HGBA1C 5.4 10/01/2018   Lab Results  Component Value Date   WBC 5.5 12/07/2019   HGB 11.3 (L) 12/07/2019   HCT 34.0 (L) 12/07/2019   MCV 91.2 12/07/2019   PLT 159 12/07/2019   Lab  Results  Component Value Date   ALT 17 12/07/2019   AST 25 12/07/2019   ALKPHOS 87 12/07/2019   BILITOT 0.7 12/07/2019     Review of Systems  Constitutional: Negative.  Negative for chills, fatigue, fever and unexpected weight change.  HENT: Positive for congestion. Negative for ear discharge, ear pain, rhinorrhea, sinus pressure, sneezing and sore throat.   Eyes: Negative for double vision, photophobia, pain, discharge, redness and itching.  Respiratory: Negative for cough, shortness of breath, wheezing and stridor.   Gastrointestinal: Negative for abdominal pain, blood in stool, constipation, diarrhea, nausea and vomiting.  Endocrine: Negative for cold intolerance, heat intolerance, polydipsia, polyphagia and polyuria.  Genitourinary: Negative for dysuria, flank pain, frequency, hematuria, menstrual problem, pelvic pain, urgency, vaginal bleeding and vaginal discharge.  Musculoskeletal: Negative for arthralgias, back pain and myalgias.  Skin: Positive for rash.  Allergic/Immunologic: Negative for environmental allergies and food allergies.  Neurological: Negative for dizziness, weakness, light-headedness, numbness and headaches.  Hematological: Negative for adenopathy. Does not bruise/bleed easily.  Psychiatric/Behavioral: Negative for dysphoric mood. The patient is not nervous/anxious.     Patient Active Problem List   Diagnosis Date Noted  . Lumbar spondylosis 02/16/2020  . Arthritis of left knee 11/24/2019  . Left hip pain 08/08/2019  . Primary osteoarthritis of left hip 08/08/2019  . Non-seasonal allergic rhinitis 04/12/2019  . Bursitis of left shoulder 03/24/2019  . Benign essential hypertension 03/06/2019  . Proteinuria 03/06/2019  . Trochanteric  bursitis of left hip 10/04/2018  . SI joint arthritis (left) 10/04/2018  . Exertional chest pain 09/30/2018  . CKD (chronic kidney disease), stage III ([place]) 09/30/2018  . Psoriatic arthritis ([place]) 04/23/2018  . Trigger ring  finger of left hand 04/14/2018  . Female pelvic pain 01/20/2018  . History of revision of total knee arthroplasty 01/20/2018  . Iron deficiency anemia   . Heme + stool   . Acute gastritis without hemorrhage   . Gastric polyps   . Benign neoplasm of ascending colon   . Benign neoplasm of transverse colon   . Polyp of sigmoid colon   . Hypertension 10/01/2017  . Hypothyroidism 10/01/2017  . Gastroesophageal reflux disease 10/01/2017  . Hypokalemia 10/01/2017  . Mixed hyperlipidemia 10/01/2017  . Reactive airway disease 10/01/2017  . Bradycardia 08/24/2017  . Severe obesity (BMI 35.0-35.9 with comorbidity) ([place]) 08/18/2017  . Chronic gouty arthropathy without tophi 06/25/2017  . Encounter for long-term (current) use of high-risk medication 06/25/2017  . Positive ANA (antinuclear antibody) 06/17/2017  . Cervical spondylosis without myelopathy 06/16/2017  . Osteoarthritis of left shoulder due to rotator cuff injury 06/16/2017  . Sprain of anterior talofibular ligament of right ankle 06/16/2017  . Lumbar radiculopathy 04/21/2017  . Lumbar degenerative disc disease 04/21/2017  . Chronic pain syndrome 04/21/2017  . Spinal stenosis, lumbar region, with neurogenic claudication 04/21/2017  . SS-A antibody positive 03/05/2017  . Chronic pain of left knee 08/03/2015  . DOE (dyspnea on exertion) 12/28/2013    Allergies  Allergen Reactions  . Ampicillin Swelling  . Penicillins Anaphylaxis and Swelling    IgE = 10 (11/05/2017)  Has patient had a PCN reaction causing immediate rash, facial/tongue/throat swelling, SOB or lightheadedness with hypotension: Yes Has patient had a PCN reaction causing severe rash involving mucus membranes or skin necrosis: No Has patient had a PCN reaction that required hospitalization: No Has patient had a PCN reaction occurring within the last 10 years: Yes If all of the above answers are "NO", then may proceed with Cephalosporin use.  . Vancomycin     Other  reaction(s): Other (see comments)  . Vibramycin [Doxycycline Calcium] Rash    Past Surgical History:  Procedure Laterality Date  . ANKLE SURGERY    . CARPAL TUNNEL RELEASE     x3  . COLONOSCOPY  2000?  . COLONOSCOPY WITH PROPOFOL N/A 10/22/2017   Procedure: COLONOSCOPY WITH PROPOFOL;  Surgeon: Lucilla Lame, MD;  Location: [facility];  Service: Endoscopy;  Laterality: N/A;  . ESOPHAGOGASTRODUODENOSCOPY (EGD) WITH PROPOFOL N/A 10/22/2017   Procedure: ESOPHAGOGASTRODUODENOSCOPY (EGD) WITH PROPOFOL;  Surgeon: Lucilla Lame, MD;  Location: [facility];  Service: Endoscopy;  Laterality: N/A;  . FUSION OF TALONAVICULAR JOINT Right 08/03/2015   Procedure: TAILOR NAVICULAR JOINT FUSION - RIGHT ;  Surgeon: [name], DPM;  Location: [facility];  Service: Podiatry;  Laterality: Right;  . JOINT REPLACEMENT Right    knee x 3 ,right x1 and left x2  . POLYPECTOMY N/A 10/22/2017   Procedure: POLYPECTOMY;  Surgeon: Lucilla Lame, MD;  Location: [place];  Service: Endoscopy;  Laterality: N/A;  . ROTATOR CUFF REPAIR    . TONSILLECTOMY    . TOTAL KNEE REVISION Right 01/20/2018   Procedure: POLYETHYLENE EXCHANGE;  Surgeon: Dereck Leep, MD;  Location: [facility];  Service: Orthopedics;  Laterality: Right;  . TUBAL LIGATION    . UPPER GI ENDOSCOPY  2000?    Social History   Tobacco Use  . Smoking status:  Former Smoker    Packs/day: 2.00    Years: 20.00    Pack years: 40.00    Types: Cigarettes    Quit date: 02/24/1993    Years since quitting: 27.0  . Smokeless tobacco: Never Used  Vaping Use  . Vaping Use: Never used  Substance Use Topics  . Alcohol use: No    Alcohol/week: 0.0 standard drinks  . Drug use: No     Medication list has been reviewed and updated.  Current Meds  Medication Sig  . albuterol (VENTOLIN HFA) 108 (90 Base) MCG/ACT inhaler Inhale 2 puffs into the lungs every 4 (four) hours as needed for wheezing.  Marland Kitchen allopurinol (ZYLOPRIM) 100 MG tablet Take  200 mg by mouth every morning.   Marland Kitchen aspirin EC 81 MG tablet Take 81 mg by mouth daily. Swallow whole.  . betamethasone dipropionate 0.05 % cream APPLY TO PSORIASIS ON HANDS TWICE DAILY ONLY. AVOID FACE, GROIN AND AXILLA (Patient taking differently: as needed.)  . betamethasone valerate (VALISONE) 0.1 % cream Apply topically 2 (two) times daily as needed (Rash).  . Cholecalciferol (VITAMIN D3) 2000 units TABS Take 2,000 Units by mouth daily.  . diclofenac sodium (VOLTAREN) 1 % GEL Apply 2 g topically 3 (three) times daily.   . ferrous sulfate 325 (65 FE) MG tablet Take 325 mg by mouth daily with breakfast.   . gabapentin (NEURONTIN) 600 MG tablet Take 1 tablet (600 mg total) by mouth at bedtime.  . hydrALAZINE (APRESOLINE) 50 MG tablet TAKE 1 TABLET(50 MG) BY MOUTH TWICE DAILY  . HYDROcodone-acetaminophen (NORCO) 7.5-325 MG tablet Take 1 tablet by mouth every 8 (eight) hours as needed for severe pain. Must last 30 days.  Derrill Memo ON 03/29/2020] HYDROcodone-acetaminophen (NORCO) 7.5-325 MG tablet Take 1 tablet by mouth every 8 (eight) hours as needed for severe pain. Must last 30 days.  Derrill Memo ON 04/28/2020] HYDROcodone-acetaminophen (NORCO) 7.5-325 MG tablet Take 1 tablet by mouth every 8 (eight) hours as needed for severe pain. Must last 30 days.  Marland Kitchen ketoconazole (NIZORAL) 2 % cream daily as needed.   Marland Kitchen losartan (COZAAR) 25 MG tablet Take 1 tablet (25 mg total) by mouth every morning.  . meclizine (ANTIVERT) 12.5 MG tablet Take 1 tablet (12.5 mg total) by mouth 3 (three) times daily as needed for dizziness.  . methotrexate (RHEUMATREX) 2.5 MG tablet Take 20 mg by mouth every Friday. Take 5 tablets by mouth once weekly  . Multiple Vitamins-Calcium (ONE-A-DAY WOMENS FORMULA PO) Take 1 tablet by mouth daily.  . mupirocin ointment (BACTROBAN) 2 % Apply 1 application topically 2 (two) times daily.  Marland Kitchen omega-3 acid ethyl esters (LOVAZA) 1 g capsule TAKE 1 CAPSULE BY MOUTH TWICE DAILY  . omeprazole (PRILOSEC)  40 MG capsule Take 1 capsule (40 mg total) by mouth daily.  Vladimir Faster Glycol-Propyl Glycol (SYSTANE OP) Place 1 drop into both eyes daily as needed (dry eyes).   . potassium chloride SA (KLOR-CON) 20 MEQ tablet Take 0.5 tablets (10 mEq total) by mouth daily.  Marland Kitchen SYNTHROID 175 MCG tablet TAKE 1 TABLET BY MOUTH EVERY DAY  . tiZANidine (ZANAFLEX) 4 MG tablet Take 1 tablet (4 mg total) by mouth 2 (two) times daily as needed for muscle spasms. (Patient taking differently: Take 4 mg by mouth at bedtime.)  . triamcinolone cream (KENALOG) 0.1 % Apply 1 application topically every morning.  . triamterene-hydrochlorothiazide (MAXZIDE) 75-50 MG tablet Take 1 tablet by mouth daily.    PHQ 2/9 Scores 03/19/2020  12/08/2019 09/08/2019 04/25/2019  PHQ - 2 Score 0 0 0 0  PHQ- 9 Score - 0 0 -    GAD 7 : Generalized Anxiety Score 12/08/2019 09/08/2019  Nervous, Anxious, on Edge 0 0  Control/stop worrying 0 0  Worry too much - different things 0 0  Trouble relaxing 0 0  Restless 0 0  Easily annoyed or irritable 0 0  Afraid - awful might happen 0 0  Total GAD 7 Score 0 0    BP Readings from Last 3 Encounters:  03/22/20 (!) 148/80  03/05/20 (!) 160/90  12/08/19 130/80    Physical Exam Vitals and nursing note reviewed.  Constitutional:      General: She is not in acute distress.    Appearance: She is not diaphoretic.  HENT:     Head: Normocephalic and atraumatic.     Right Ear: External ear normal.     Left Ear: External ear normal.     Nose: Congestion and rhinorrhea present.     Mouth/Throat:     Mouth: Oropharynx is clear and moist.  Eyes:     General:        Right eye: No discharge.        Left eye: No discharge.     Extraocular Movements: EOM normal.     Conjunctiva/sclera: Conjunctivae normal.     Pupils: Pupils are equal, round, and reactive to light.  Neck:     Thyroid: No thyromegaly.     Vascular: No JVD.  Cardiovascular:     Rate and Rhythm: Normal rate and regular rhythm.      Pulses: Intact distal pulses.     Heart sounds: Normal heart sounds. No murmur heard. No friction rub. No gallop.   Pulmonary:     Effort: Pulmonary effort is normal.     Breath sounds: Normal breath sounds. No wheezing, rhonchi or rales.  Chest:     Chest wall: No tenderness.  Abdominal:     General: Bowel sounds are normal.     Palpations: Abdomen is soft. There is no mass.     Tenderness: There is no abdominal tenderness. There is no guarding.  Musculoskeletal:        General: No edema. Normal range of motion.     Cervical back: Normal range of motion and neck supple.  Lymphadenopathy:     Cervical: No cervical adenopathy.  Skin:    General: Skin is warm and dry.     Comments: petichiae above eyebrows/erythema  Neurological:     Mental Status: She is alert.     Deep Tendon Reflexes: Reflexes are normal and symmetric.     Wt Readings from Last 3 Encounters:  03/22/20 283 lb (128.4 kg)  03/05/20 264 lb (119.7 kg)  12/08/19 280 lb (127 kg)    BP (!) 148/80   Pulse 68   Temp 98.9 F (37.2 C)   Ht 5\' 11"  (1.803 m)   Wt 283 lb (128.4 kg)   SpO2 99%   BMI 39.47 kg/m   Assessment and Plan: 1. Viral upper respiratory tract infection New onset.  Episodic.  Stable.  Patient has sinus congestion with some rhinorrhea consistent with a viral or allergic sinusitis.  We will treat with Singulair 10 mg once a day at bedtime. - montelukast (SINGULAIR) 10 MG tablet; Take 1 tablet (10 mg total) by mouth at bedtime.  Dispense: 30 tablet; Refill: 3  2. Dermatitis contusiformis New onset.  Persistent.  Stable.  Patient has an erythematous rash with small petechial bilateral above the eyebrows upon questioning patient admits that she recently had her eyebrows waxed this looks to be more of an dermatitis secondary to mild trauma of the waxing with some petechiae residual.  This should clear on its own but we will hasten things along with some triamcinolone cream. - triamcinolone (KENALOG)  0.1 %; Apply 1 application topically 2 (two) times daily.  Dispense: 30 g; Refill: 0

## 2020-03-29 ENCOUNTER — Other Ambulatory Visit: Payer: Self-pay | Admitting: Family Medicine

## 2020-03-29 DIAGNOSIS — E039 Hypothyroidism, unspecified: Secondary | ICD-10-CM

## 2020-04-04 ENCOUNTER — Ambulatory Visit: Payer: Medicare Other | Admitting: Dermatology

## 2020-04-19 NOTE — Progress Notes (Signed)
[facility]  [address], [address] [place], [place] [address] Phone: [phone]  Fax: [phone]   Clinic Day:  04/23/2020  Referring physician: Anthonette Legato, MD  Chief Complaint: Michele Moore is a 70 y.o. female with anemia in stage IIIb chronic kidney disease who is referred in consultation by Dr. Holley Raring for assessment and management.   HPI:  The patient states that she has been anemic since she was 70 years old. The patient takes oral iron once daily. She eats meat 3-4 times per week. She eats green leafy vegetables everyday. She denies ice pica or any other cravings. She has restless legs.  She saw Dr. Holley Raring on 03/22/2020.  She has Sjogren's syndrome, rheumatoid arthritis, hypertension, and proteinuria.  Hematocrit was 32.3, hemoglobin 10.8, MCV 91.5, platelets 166,000, WBC 7,100. Creatinine 1.79 (CrCl 32 ml/min).Her chronic kidney disease appeared to have worsened a bit. She continued Losartan 25 mg daily. Follow-up was planned for 4 months.  Labs followed: 01/26/2012:  Hematocrit 37.6, hemoglobin 12.6, MCV 86.0, platelets 209,000, WBC   5,300. Creatinine 1.01 (CrCl >60 ml/min). 12/27/2013:  Hematocrit 29.5, hemoglobin   9.5, MCV 86.0, platelets 214,000, WBC   6,800. Creatinine 1.63 (CrCl 41 ml/min). 08/03/2015:  Hematocrit 32.4, hemoglobin 10.6, MCV 82.4, platelets 150,000, WBC   6,000. 08/25/2017:  Hematocrit 34.5, hemoglobin 11.4, MCV 86.6, platelets 157,000, WBC   4,900. 01/21/2018:  Hematocrit 33.5, hemoglobin 10.8, MCV 88.4, platelets 181,000, WBC 10,900. 07/14/2018:  Hematocrit 28.5, hemoglobin   9.6, MCV 89.0, platelets 182,000, WBC   4,200. Creatinine 1.69 (CrCl 35 ml/min).  10/01/2018:  Hematocrit 31.5, hemoglobin 10.2, MCV 94.6, platelets 167,000, WBC   3,500. 04/22/2019:  Hematocrit 33.9, hemoglobin 11.1, MCV 93.0, platelets 162,000, WBC   4,500. Creatinine 1.79 (CrCl 33 ml/min). 08/28/2019:  Hematocrit 35.5, hemoglobin 11.6, MCV 92.9,  platelets 166,000, WBC   6,000. Creatinine 1.92 (CrCl 30 ml/min).  12/07/2019:  Hematocrit 34.0, hemoglobin 11.3, MCV 91.2, platelets 159,000, WBC   5,500. Creatinine 1.71 (CrCl 30 ml/min). 03/22/2020:  Hematocrit 32.3, hemoglobin 10.8, MCV 91.5, platelets 166,000, WBC   7,100. Creatinine 1.79 (CrCl 32 ml/min).  Colonoscopy on 10/22/2017 by Dr. Allen Norris for heme positive stool revealed one 7 mm polyp in the ascending colon, one 3 mm polyp in the transverse colon, and three 1 to 4 mm polyps in the sigmoid colon. There were non-bleeding internal hemorrhoids. There were petechia(e) in the rectum. Pathology showed two tubular adenomas and one hyperplastic polyp. EGD showed gastritis and one gastric polyp.  Symptomatically, she has been "ok". She has vertigo. Her foot is numb s/p surgery. She has glaucoma. She has shortness of breath on exertion and chest pain due to asthma. For the past two months, she has had a "squeezing, hugging" left rib pain when she takes deep breaths. When she presses the area, it hurts. She denies any trauma or injury to the area.   She denies fevers, sweats, headaches, runny nose, sore throat, cough, palpitations, nausea, vomiting, diarrhea, reflux, urinary symptoms, skin changes, weakness, and bleeding of any kind.  She has had surgeries on many joints (knees x 4, shoulder, ankles). She has also had ankle surgery. She has rheumatoid arthritis and osteoarthritis. She has gout. She sees a rheumatologist.  Her maternal aunt had cancer (unkown type). She denies any family history of blood disorders.   Past Medical History:  Diagnosis Date  . Allergy   . Anemia   . Arthritis   . Asthma   . Cataract   .  Chronic kidney disease    STAGE 3 PER DR EASON 08/02/15  . Collagen vascular disease ([place])   . Complication of anesthesia    WOKE UP DURING COLONOSCOPY  . Dyspnea   . Dysrhythmia    IRREGULAR HEART BEAT  . Family history of adverse reaction to anesthesia    sister difficult  to put to sleep  . GERD (gastroesophageal reflux disease)   . Glaucoma   . H/O tooth extraction    all lower teeth 1/19  . Hemorrhoid   . History of hiatal hernia   . History of kidney stones    H/O  . Hypertension   . Hypothyroidism   . Migraines   . Mixed hyperlipidemia   . Multiple gastric ulcers   . Thyroid disease   . Wears dentures    partial upper and lower    Past Surgical History:  Procedure Laterality Date  . ANKLE SURGERY    . CARPAL TUNNEL RELEASE     x3  . COLONOSCOPY  2000?  . COLONOSCOPY WITH PROPOFOL N/A 10/22/2017   Procedure: COLONOSCOPY WITH PROPOFOL;  Surgeon: Lucilla Lame, MD;  Location: [facility];  Service: Endoscopy;  Laterality: N/A;  . ESOPHAGOGASTRODUODENOSCOPY (EGD) WITH PROPOFOL N/A 10/22/2017   Procedure: ESOPHAGOGASTRODUODENOSCOPY (EGD) WITH PROPOFOL;  Surgeon: Lucilla Lame, MD;  Location: [facility];  Service: Endoscopy;  Laterality: N/A;  . FUSION OF TALONAVICULAR JOINT Right 08/03/2015   Procedure: TAILOR NAVICULAR JOINT FUSION - RIGHT ;  Surgeon: [name], DPM;  Location: [facility];  Service: Podiatry;  Laterality: Right;  . JOINT REPLACEMENT Right    knee x 3 ,right x1 and left x2  . POLYPECTOMY N/A 10/22/2017   Procedure: POLYPECTOMY;  Surgeon: Lucilla Lame, MD;  Location: [facility];  Service: Endoscopy;  Laterality: N/A;  . ROTATOR CUFF REPAIR    . TONSILLECTOMY    . TOTAL KNEE REVISION Right 01/20/2018   Procedure: POLYETHYLENE EXCHANGE;  Surgeon: Dereck Leep, MD;  Location: [facility];  Service: Orthopedics;  Laterality: Right;  . TUBAL LIGATION    . UPPER GI ENDOSCOPY  2000?    Family History  Problem Relation Age of Onset  . Heart failure Mother   . Gout Mother   . Arthritis Mother   . Hypertension Mother   . Stroke Mother   . Heart failure Father   . Diabetes Father   . Hyperlipidemia Father   . COPD Sister   . Cancer Maternal Aunt        breast  . Cancer Maternal Uncle        kidney  .  Breast cancer Neg Hx     Social History:  reports that she quit smoking about 27 years ago. Her smoking use included cigarettes. She has a 40.00 pack-year smoking history. She has never used smokeless tobacco. She reports that she does not drink alcohol and does not use drugs. She denies current alcohol or tobacco use. She quit smoking >27 years ago. She denies exposure to radiation or toxins. She retired at age 6. 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 The patient is alone today.  Allergies:  Allergies  Allergen Reactions  . Ampicillin Swelling  . Penicillins Anaphylaxis and Swelling    IgE = 10 (11/05/2017)  Has patient had a PCN reaction causing immediate rash, facial/tongue/throat swelling, SOB or lightheadedness with hypotension: Yes Has patient had a PCN reaction causing severe rash involving mucus membranes or skin necrosis: No Has patient  had a PCN reaction that required hospitalization: No Has patient had a PCN reaction occurring within the last 10 years: Yes If all of the above answers are "NO", then may proceed with Cephalosporin use.  . Vancomycin     Other reaction(s): Other (see comments)  . Vibramycin [Doxycycline Calcium] Rash    Current Medications: Current Outpatient Medications  Medication Sig Dispense Refill  . albuterol (VENTOLIN HFA) 108 (90 Base) MCG/ACT inhaler Inhale 2 puffs into the lungs every 4 (four) hours as needed for wheezing. 6.7 g 11  . allopurinol (ZYLOPRIM) 100 MG tablet Take 200 mg by mouth every morning.     Marland Kitchen aspirin EC 81 MG tablet Take 81 mg by mouth daily. Swallow whole.    . betamethasone dipropionate 0.05 % cream APPLY TO PSORIASIS ON HANDS TWICE DAILY ONLY. AVOID FACE, GROIN AND AXILLA (Patient taking differently: as needed.) 45 g 0  . betamethasone valerate (VALISONE) 0.1 % cream Apply topically 2 (two) times daily as needed (Rash). 45 g 3  . Cholecalciferol (VITAMIN D3) 2000 units TABS Take 2,000 Units by mouth daily.    . diclofenac  sodium (VOLTAREN) 1 % GEL Apply 2 g topically 3 (three) times daily.     . ferrous sulfate 325 (65 FE) MG tablet Take 325 mg by mouth daily with breakfast.     . gabapentin (NEURONTIN) 600 MG tablet Take 1 tablet (600 mg total) by mouth at bedtime. 30 tablet 5  . hydrALAZINE (APRESOLINE) 50 MG tablet TAKE 1 TABLET(50 MG) BY MOUTH TWICE DAILY 180 tablet 1  . HYDROcodone-acetaminophen (NORCO) 7.5-325 MG tablet Take 1 tablet by mouth every 8 (eight) hours as needed for severe pain. Must last 30 days. 90 tablet 0  . [START ON 04/28/2020] HYDROcodone-acetaminophen (NORCO) 7.5-325 MG tablet Take 1 tablet by mouth every 8 (eight) hours as needed for severe pain. Must last 30 days. 90 tablet 0  . ketoconazole (NIZORAL) 2 % cream daily as needed.     Marland Kitchen losartan (COZAAR) 25 MG tablet Take 1 tablet (25 mg total) by mouth every morning. 90 tablet 1  . meclizine (ANTIVERT) 12.5 MG tablet Take 1 tablet (12.5 mg total) by mouth 3 (three) times daily as needed for dizziness. 30 tablet 0  . methotrexate (RHEUMATREX) 2.5 MG tablet Take 20 mg by mouth every Friday. Take 5 tablets by mouth once weekly    . montelukast (SINGULAIR) 10 MG tablet Take 1 tablet (10 mg total) by mouth at bedtime. 30 tablet 3  . Multiple Vitamins-Calcium (ONE-A-DAY WOMENS FORMULA PO) Take 1 tablet by mouth daily.    . mupirocin ointment (BACTROBAN) 2 % Apply 1 application topically 2 (two) times daily. 22 g 0  . omega-3 acid ethyl esters (LOVAZA) 1 g capsule TAKE 1 CAPSULE BY MOUTH TWICE DAILY 180 capsule 1  . omeprazole (PRILOSEC) 40 MG capsule Take 1 capsule (40 mg total) by mouth daily. 90 capsule 1  . Polyethyl Glycol-Propyl Glycol (SYSTANE OP) Place 1 drop into both eyes daily as needed (dry eyes).     . potassium chloride SA (KLOR-CON) 20 MEQ tablet Take 0.5 tablets (10 mEq total) by mouth daily. 45 tablet 1  . SYNTHROID 175 MCG tablet TAKE 1 TABLET BY MOUTH EVERY DAY 90 tablet 0  . tiZANidine (ZANAFLEX) 4 MG tablet Take 1 tablet (4 mg  total) by mouth 2 (two) times daily as needed for muscle spasms. (Patient taking differently: Take 4 mg by mouth at bedtime.) 60 tablet 5  .  triamcinolone (KENALOG) 0.1 % Apply 1 application topically 2 (two) times daily. 30 g 0  . triamterene-hydrochlorothiazide (MAXZIDE) 75-50 MG tablet Take 1 tablet by mouth daily. 90 tablet 1  . HYDROcodone-acetaminophen (NORCO) 7.5-325 MG tablet Take 1 tablet by mouth every 8 (eight) hours as needed for severe pain. Must last 30 days. 90 tablet 0   No current facility-administered medications for this visit.    Review of Systems  Constitutional: Negative for chills, diaphoresis, fever, malaise/fatigue and weight loss.  HENT: Negative for congestion, ear discharge, ear pain, hearing loss, nosebleeds, sinus pain, sore throat and tinnitus.   Eyes:       Glaucoma  Respiratory: Positive for shortness of breath (on exertion, due to asthma). Negative for cough, hemoptysis and sputum production.   Cardiovascular: Positive for chest pain (due to asthma). Negative for palpitations and leg swelling.  Gastrointestinal: Negative for abdominal pain, blood in stool, constipation, diarrhea, heartburn, melena, nausea and vomiting.  Genitourinary: Negative for dysuria, frequency, hematuria and urgency.  Musculoskeletal: Positive for joint pain (arthritis). Negative for back pain, myalgias and neck pain.       Left rib pain  Skin: Negative for itching and rash.  Neurological: Positive for dizziness (vertigo) and sensory change (foot numbness s/p surgery). Negative for tingling, weakness and headaches.  Endo/Heme/Allergies: Does not bruise/bleed easily.  Psychiatric/Behavioral: Negative for depression and memory loss. The patient is not nervous/anxious and does not have insomnia.   All other systems reviewed and are negative.  Performance status (ECOG): 1-2  Vitals Blood pressure 139/78, pulse 92, temperature 98.7 F (37.1 C), temperature source Tympanic, weight 271 lb  2.7 oz (123 kg).   Physical Exam Vitals and nursing note reviewed.  Constitutional:      General: She is not in acute distress.    Appearance: She is not diaphoretic.     Comments: Patient has a rolling walker by her side. Able to get onto exam table without assistance.  HENT:     Head: Normocephalic and atraumatic.     Mouth/Throat:     Mouth: Mucous membranes are moist.     Pharynx: Oropharynx is clear.  Eyes:     General: No scleral icterus.    Extraocular Movements: Extraocular movements intact.     Conjunctiva/sclera: Conjunctivae normal.     Pupils: Pupils are equal, round, and reactive to light.  Cardiovascular:     Rate and Rhythm: Normal rate and regular rhythm.     Heart sounds: Normal heart sounds. No murmur heard.   Pulmonary:     Effort: Pulmonary effort is normal. No respiratory distress.     Breath sounds: Normal breath sounds. No wheezing or rales.  Chest:     Chest wall: No tenderness.  Breasts:     Right: No axillary adenopathy or supraclavicular adenopathy.     Left: No axillary adenopathy or supraclavicular adenopathy.    Abdominal:     General: Bowel sounds are normal. There is no distension.     Palpations: Abdomen is soft. There is no mass.     Tenderness: There is no abdominal tenderness. There is no guarding or rebound.  Musculoskeletal:        General: Tenderness (BLE) present. No swelling. Normal range of motion.     Cervical back: Normal range of motion and neck supple.  Lymphadenopathy:     Head:     Right side of head: No preauricular, posterior auricular or occipital adenopathy.     Left side of  head: No preauricular, posterior auricular or occipital adenopathy.     Cervical: No cervical adenopathy.     Upper Body:     Right upper body: No supraclavicular or axillary adenopathy.     Left upper body: No supraclavicular or axillary adenopathy.     Lower Body: No right inguinal adenopathy. No left inguinal adenopathy.  Skin:    General:  Skin is warm and dry.  Neurological:     Mental Status: She is alert and oriented to person, place, and time.  Psychiatric:        Behavior: Behavior normal.        Thought Content: Thought content normal.        Judgment: Judgment normal.     No visits with results within 3 Day(s) from this visit.  Latest known visit with results is:  Hospital Outpatient Visit on 12/12/2019  Component Date Value Ref Range Status  . SARS Coronavirus 2 12/12/2019 NEGATIVE  NEGATIVE Final   Comment: (NOTE) SARS-CoV-2 target nucleic acids are NOT DETECTED.  The SARS-CoV-2 RNA is generally detectable in upper and lower respiratory specimens during the acute phase of infection. Negative results do not preclude SARS-CoV-2 infection, do not rule out co-infections with other pathogens, and should not be used as the sole basis for treatment or other patient management decisions. Negative results must be combined with clinical observations, patient history, and epidemiological information. The expected result is Negative.  Fact Sheet for Patients: SugarRoll.be  Fact Sheet for Healthcare Providers: https://www.woods-mathews.com/  This test is not yet approved or cleared by the [place] FDA and  has been authorized for detection and/or diagnosis of SARS-CoV-2 by FDA under an Emergency Use Authorization (EUA). This EUA will remain  in effect (meaning this test can be used) for the duration of the COVID-19 declaration under Se                          ction 564(b)(1) of the Act, 21 U.S.C. section 360bbb-3(b)(1), unless the authorization is terminated or revoked sooner.  Performed at [facility], [place] [address], [place], [place] [address]     Assessment:  Michele Moore is a 70 y.o. female with anemia in stage IIIb chronic kidney disease. She has Sjogren's syndrome, rheumatoid arthritis, hypertension, and proteinuria.  She is on oral iron.  Labs on  03/22/2020 revealed a hematocrit 32.3, hemoglobin 10.8, MCV 91.5, platelets 166,000, WBC 7,100. Creatinine 1.79 (CrCl 32 ml/min).  Colonoscopy on 10/22/2017 for heme positive stool revealed one 7 mm polyp in the ascending colon, one 3 mm polyp in the transverse colon, and three 1 to 4 mm polyps in the sigmoid colon. There were non-bleeding internal hemorrhoids. There were petechia(e) in the rectum. Pathology showed two tubular adenomas and one hyperplastic polyp. EGD on 10/22/2017 showed gastritis and one gastric polyp.  Symptomatically, she feels "ok".  She has shortness of breath on exertion and chest pain due to asthma. She denies bleeding of any kind.  Plan: 1.   Labs today:  CBC with diff, ferritin, iron studies, sed rate/CRP, B12, folate. 2.   Normocytic anemia  Patient has a long standing history of iron deficiency.   She is on oral iron.   She denies any bleeding.  She has likely anemia of chronic disease.  Discuss work-up.   Discuss Retacrit if hemoglobin < 10 and iron stores replete. 3.   RTC in 1 week for MD assessment, review  of work-up and discussion regarding direction of therapy.  I discussed the assessment and treatment plan with the patient.  The patient was provided an opportunity to ask questions and all were answered.  The patient agreed with the plan and demonstrated an understanding of the instructions.  The patient was advised to call back if the symptoms worsen or if the condition fails to improve as anticipated.    C. Mike Gip, MD, PhD    04/23/2020, 3:34 PM  I, Mirian Mo Tufford, am acting as Education administrator for [employer]. Mike Gip, MD, PhD.  I,  C. Mike Gip, MD, have reviewed the above documentation for accuracy and completeness, and I agree with the above.

## 2020-04-22 DIAGNOSIS — D649 Anemia, unspecified: Secondary | ICD-10-CM | POA: Insufficient documentation

## 2020-04-23 ENCOUNTER — Inpatient Hospital Stay: Payer: Medicare Other | Attending: Hematology and Oncology | Admitting: Hematology and Oncology

## 2020-04-23 ENCOUNTER — Encounter: Payer: Self-pay | Admitting: Hematology and Oncology

## 2020-04-23 ENCOUNTER — Inpatient Hospital Stay: Payer: Medicare Other

## 2020-04-23 ENCOUNTER — Other Ambulatory Visit: Payer: Self-pay

## 2020-04-23 VITALS — BP 139/78 | HR 92 | Temp 98.7°F | Wt 271.2 lb

## 2020-04-23 DIAGNOSIS — N1832 Chronic kidney disease, stage 3b: Secondary | ICD-10-CM | POA: Insufficient documentation

## 2020-04-23 DIAGNOSIS — Z79899 Other long term (current) drug therapy: Secondary | ICD-10-CM | POA: Insufficient documentation

## 2020-04-23 DIAGNOSIS — K219 Gastro-esophageal reflux disease without esophagitis: Secondary | ICD-10-CM | POA: Diagnosis not present

## 2020-04-23 DIAGNOSIS — E782 Mixed hyperlipidemia: Secondary | ICD-10-CM | POA: Insufficient documentation

## 2020-04-23 DIAGNOSIS — M109 Gout, unspecified: Secondary | ICD-10-CM | POA: Insufficient documentation

## 2020-04-23 DIAGNOSIS — M069 Rheumatoid arthritis, unspecified: Secondary | ICD-10-CM | POA: Diagnosis not present

## 2020-04-23 DIAGNOSIS — R809 Proteinuria, unspecified: Secondary | ICD-10-CM | POA: Insufficient documentation

## 2020-04-23 DIAGNOSIS — D649 Anemia, unspecified: Secondary | ICD-10-CM | POA: Diagnosis not present

## 2020-04-23 DIAGNOSIS — Z87891 Personal history of nicotine dependence: Secondary | ICD-10-CM | POA: Insufficient documentation

## 2020-04-23 DIAGNOSIS — I129 Hypertensive chronic kidney disease with stage 1 through stage 4 chronic kidney disease, or unspecified chronic kidney disease: Secondary | ICD-10-CM | POA: Diagnosis not present

## 2020-04-23 DIAGNOSIS — E039 Hypothyroidism, unspecified: Secondary | ICD-10-CM | POA: Insufficient documentation

## 2020-04-23 DIAGNOSIS — R42 Dizziness and giddiness: Secondary | ICD-10-CM | POA: Diagnosis not present

## 2020-04-23 DIAGNOSIS — Z791 Long term (current) use of non-steroidal anti-inflammatories (NSAID): Secondary | ICD-10-CM | POA: Insufficient documentation

## 2020-04-23 DIAGNOSIS — D631 Anemia in chronic kidney disease: Secondary | ICD-10-CM | POA: Diagnosis not present

## 2020-04-23 DIAGNOSIS — Z7982 Long term (current) use of aspirin: Secondary | ICD-10-CM | POA: Diagnosis not present

## 2020-04-23 LAB — IRON AND TIBC
Iron: 115 ug/dL (ref 28–170)
Saturation Ratios: 37 % — ABNORMAL HIGH (ref 10.4–31.8)
TIBC: 315 ug/dL (ref 250–450)
UIBC: 200 ug/dL

## 2020-04-23 LAB — CBC WITH DIFFERENTIAL/PLATELET
Abs Immature Granulocytes: 0.01 10*3/uL (ref 0.00–0.07)
Basophils Absolute: 0 10*3/uL (ref 0.0–0.1)
Basophils Relative: 1 %
Eosinophils Absolute: 0.1 10*3/uL (ref 0.0–0.5)
Eosinophils Relative: 2 %
HCT: 35 % — ABNORMAL LOW (ref 36.0–46.0)
Hemoglobin: 11.4 g/dL — ABNORMAL LOW (ref 12.0–15.0)
Immature Granulocytes: 0 %
Lymphocytes Relative: 23 %
Lymphs Abs: 1.1 10*3/uL (ref 0.7–4.0)
MCH: 30.1 pg (ref 26.0–34.0)
MCHC: 32.6 g/dL (ref 30.0–36.0)
MCV: 92.3 fL (ref 80.0–100.0)
Monocytes Absolute: 0.4 10*3/uL (ref 0.1–1.0)
Monocytes Relative: 8 %
Neutro Abs: 3 10*3/uL (ref 1.7–7.7)
Neutrophils Relative %: 66 %
Platelets: 168 10*3/uL (ref 150–400)
RBC: 3.79 MIL/uL — ABNORMAL LOW (ref 3.87–5.11)
RDW: 14.1 % (ref 11.5–15.5)
WBC: 4.6 10*3/uL (ref 4.0–10.5)
nRBC: 0 % (ref 0.0–0.2)

## 2020-04-23 LAB — FERRITIN: Ferritin: 107 ng/mL (ref 11–307)

## 2020-04-23 LAB — SEDIMENTATION RATE: Sed Rate: 54 mm/hr — ABNORMAL HIGH (ref 0–30)

## 2020-04-23 LAB — C-REACTIVE PROTEIN: CRP: 0.9 mg/dL (ref <1.0)

## 2020-04-23 LAB — VITAMIN B12: Vitamin B-12: 429 pg/mL (ref 180–914)

## 2020-04-23 NOTE — Progress Notes (Signed)
Patient here today for initial evaluation regarding anemia.  

## 2020-04-24 ENCOUNTER — Telehealth: Payer: Self-pay

## 2020-04-24 LAB — FOLATE: Folate: >100 ng/mL (ref >5.9)

## 2020-04-24 NOTE — Telephone Encounter (Signed)
-----   Message from Lequita Asal, MD sent at 04/24/2020 11:49 AM EST ----- Regarding: Please call patient  Folate is high.  How much is she taking?  M  ----- Message ----- From: Buel Ream, [facility] [place] Sent: 04/23/2020   3:55 PM EST To: Lequita Asal, MD

## 2020-04-24 NOTE — Telephone Encounter (Signed)
-----   Message from Lequita Asal, MD sent at 04/24/2020 11:49 AM EST ----- Regarding: Please call patient  Folate is high.  How much is she taking?  M  ----- Message ----- From: Buel Ream, Lab In [place] Sent: 04/23/2020   3:55 PM EST To: Lequita Asal, MD

## 2020-04-26 NOTE — Progress Notes (Signed)
[facility]  [address] [place], [place] [address] Phone: [phone]  Fax: [phone]   Clinic Day:  04/30/2020  Referring physician: Juline Patch, MD  Chief Complaint: Michele Moore is a 70 y.o. female with anemia in stage IIIb chronic kidney disease who is seen for review of work-up and discussion regarding direction of therapy.  HPI: The patient was last seen in the hematology clinic on 04/23/2020 for new patient assessment. At that time, she was noted to have a mild normocytic anemia.  Symptomatically, he noted shortness of breath on exertion and chest pain due to her asthma.  She denied any B symptoms.  She was taking oral iron once daily and eating meat 3-4 times a week.  She denies any ice pica.  She noted restless legs.  Work-up revealed a hematocrit of 35.0, hemoglobin 11.4, MCV 92.3, platelets 168,000, WBC 4,600 with an ANC of 3000. Ferritin was 107 with an iron saturation of 37% and a TIBC of 315. Vitamin B12 was 429 and folate >100.0. Sed rate was 54 and CRP was 0.9.  During the interim, she has been good. Her right ankle is swollen. The rest of her symptoms are stable.  She takes folic acid once daily. She will decrease it to three times per week.   Past Medical History:  Diagnosis Date  . Allergy   . Anemia   . Arthritis   . Asthma   . Cataract   . Chronic kidney disease    STAGE 3 PER DR EASON 08/02/15  . Collagen vascular disease ([place])   . Complication of anesthesia    WOKE UP DURING COLONOSCOPY  . Dyspnea   . Dysrhythmia    IRREGULAR HEART BEAT  . Family history of adverse reaction to anesthesia    sister difficult to put to sleep  . GERD (gastroesophageal reflux disease)   . Glaucoma   . H/O tooth extraction    all lower teeth 1/19  . Hemorrhoid   . History of hiatal hernia   . History of kidney stones    H/O  . Hypertension   . Hypothyroidism   . Migraines   . Mixed hyperlipidemia   . Multiple gastric ulcers    . Thyroid disease   . Wears dentures    partial upper and lower    Past Surgical History:  Procedure Laterality Date  . ANKLE SURGERY    . CARPAL TUNNEL RELEASE     x3  . COLONOSCOPY  2000?  . COLONOSCOPY WITH PROPOFOL N/A 10/22/2017   Procedure: COLONOSCOPY WITH PROPOFOL;  Surgeon: Lucilla Lame, MD;  Location: [facility];  Service: Endoscopy;  Laterality: N/A;  . ESOPHAGOGASTRODUODENOSCOPY (EGD) WITH PROPOFOL N/A 10/22/2017   Procedure: ESOPHAGOGASTRODUODENOSCOPY (EGD) WITH PROPOFOL;  Surgeon: Lucilla Lame, MD;  Location: [facility];  Service: Endoscopy;  Laterality: N/A;  . FUSION OF TALONAVICULAR JOINT Right 08/03/2015   Procedure: TAILOR NAVICULAR JOINT FUSION - RIGHT ;  Surgeon: [name], DPM;  Location: [facility];  Service: Podiatry;  Laterality: Right;  . JOINT REPLACEMENT Right    knee x 3 ,right x1 and left x2  . POLYPECTOMY N/A 10/22/2017   Procedure: POLYPECTOMY;  Surgeon: Lucilla Lame, MD;  Location: [facility];  Service: Endoscopy;  Laterality: N/A;  . ROTATOR CUFF REPAIR    . TONSILLECTOMY    . TOTAL KNEE REVISION Right 01/20/2018   Procedure: POLYETHYLENE EXCHANGE;  Surgeon: Dereck Leep, MD;  Location: [facility]  ORS;  Service: Orthopedics;  Laterality: Right;  . TUBAL LIGATION    . UPPER GI ENDOSCOPY  2000?    Family History  Problem Relation Age of Onset  . Heart failure Mother   . Gout Mother   . Arthritis Mother   . Hypertension Mother   . Stroke Mother   . Heart failure Father   . Diabetes Father   . Hyperlipidemia Father   . COPD Sister   . Cancer Maternal Aunt        breast  . Cancer Maternal Uncle        kidney  . Breast cancer Neg Hx     Social History:  reports that she quit smoking about 27 years ago. Her smoking use included cigarettes. She has a 40.00 pack-year smoking history. She has never used smokeless tobacco. She reports that she does not drink alcohol and does not use drugs. She denies current alcohol or  tobacco use. She quit smoking >27 years ago. She denies exposure to radiation or toxins. She retired at age 29. 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 The patient is alone today.  Allergies:  Allergies  Allergen Reactions  . Ampicillin Swelling  . Penicillins Anaphylaxis and Swelling    IgE = 10 (11/05/2017)  Has patient had a PCN reaction causing immediate rash, facial/tongue/throat swelling, SOB or lightheadedness with hypotension: Yes Has patient had a PCN reaction causing severe rash involving mucus membranes or skin necrosis: No Has patient had a PCN reaction that required hospitalization: No Has patient had a PCN reaction occurring within the last 10 years: Yes If all of the above answers are "NO", then may proceed with Cephalosporin use.  . Vancomycin     Other reaction(s): Other (see comments)  . Vibramycin [Doxycycline Calcium] Rash    Current Medications: Current Outpatient Medications  Medication Sig Dispense Refill  . albuterol (VENTOLIN HFA) 108 (90 Base) MCG/ACT inhaler Inhale 2 puffs into the lungs every 4 (four) hours as needed for wheezing. 6.7 g 11  . allopurinol (ZYLOPRIM) 100 MG tablet Take 200 mg by mouth every morning.     Marland Kitchen aspirin EC 81 MG tablet Take 81 mg by mouth daily. Swallow whole.    . betamethasone dipropionate 0.05 % cream APPLY TO PSORIASIS ON HANDS TWICE DAILY ONLY. AVOID FACE, GROIN AND AXILLA (Patient taking differently: as needed.) 45 g 0  . betamethasone valerate (VALISONE) 0.1 % cream Apply topically 2 (two) times daily as needed (Rash). 45 g 3  . Cholecalciferol (VITAMIN D3) 2000 units TABS Take 2,000 Units by mouth daily.    . diclofenac sodium (VOLTAREN) 1 % GEL Apply 2 g topically 3 (three) times daily.     . ferrous sulfate 325 (65 FE) MG tablet Take 325 mg by mouth daily with breakfast.     . gabapentin (NEURONTIN) 600 MG tablet Take 1 tablet (600 mg total) by mouth at bedtime. 30 tablet 5  . hydrALAZINE (APRESOLINE) 50 MG tablet TAKE 1  TABLET(50 MG) BY MOUTH TWICE DAILY 180 tablet 1  . HYDROcodone-acetaminophen (NORCO) 7.5-325 MG tablet Take 1 tablet by mouth every 8 (eight) hours as needed for severe pain. Must last 30 days. 90 tablet 0  . ketoconazole (NIZORAL) 2 % cream daily as needed.     Marland Kitchen losartan (COZAAR) 25 MG tablet Take 1 tablet (25 mg total) by mouth every morning. 90 tablet 1  . meclizine (ANTIVERT) 12.5 MG tablet Take 1 tablet (12.5 mg total) by  mouth 3 (three) times daily as needed for dizziness. 30 tablet 0  . methotrexate (RHEUMATREX) 2.5 MG tablet Take 20 mg by mouth every Friday. Take 5 tablets by mouth once weekly    . montelukast (SINGULAIR) 10 MG tablet Take 1 tablet (10 mg total) by mouth at bedtime. 30 tablet 3  . Multiple Vitamins-Calcium (ONE-A-DAY WOMENS FORMULA PO) Take 1 tablet by mouth daily.    . mupirocin ointment (BACTROBAN) 2 % Apply 1 application topically 2 (two) times daily. 22 g 0  . omega-3 acid ethyl esters (LOVAZA) 1 g capsule TAKE 1 CAPSULE BY MOUTH TWICE DAILY 180 capsule 1  . omeprazole (PRILOSEC) 40 MG capsule Take 1 capsule (40 mg total) by mouth daily. 90 capsule 1  . Polyethyl Glycol-Propyl Glycol (SYSTANE OP) Place 1 drop into both eyes daily as needed (dry eyes).     . potassium chloride SA (KLOR-CON) 20 MEQ tablet Take 0.5 tablets (10 mEq total) by mouth daily. 45 tablet 1  . SYNTHROID 175 MCG tablet TAKE 1 TABLET BY MOUTH EVERY DAY 90 tablet 0  . tiZANidine (ZANAFLEX) 4 MG tablet Take 1 tablet (4 mg total) by mouth 2 (two) times daily as needed for muscle spasms. (Patient taking differently: Take 4 mg by mouth at bedtime.) 60 tablet 5  . triamcinolone (KENALOG) 0.1 % Apply 1 application topically 2 (two) times daily. 30 g 0  . triamterene-hydrochlorothiazide (MAXZIDE) 75-50 MG tablet Take 1 tablet by mouth daily. 90 tablet 1  . HYDROcodone-acetaminophen (NORCO) 7.5-325 MG tablet Take 1 tablet by mouth every 8 (eight) hours as needed for severe pain. Must last 30 days. 90 tablet 0   . HYDROcodone-acetaminophen (NORCO) 7.5-325 MG tablet Take 1 tablet by mouth every 8 (eight) hours as needed for severe pain. Must last 30 days. 90 tablet 0   No current facility-administered medications for this visit.    Review of Systems  Constitutional: Negative for chills, diaphoresis, fever, malaise/fatigue and weight loss (up 7 lbs).  HENT: Negative for congestion, ear discharge, ear pain, hearing loss, nosebleeds, sinus pain, sore throat and tinnitus.   Eyes:       Glaucoma  Respiratory: Positive for shortness of breath (on exertion, due to asthma). Negative for cough, hemoptysis and sputum production.   Cardiovascular: Positive for chest pain (due to asthma) and leg swelling (right ankle). Negative for palpitations.  Gastrointestinal: Negative for abdominal pain, blood in stool, constipation, diarrhea, heartburn, melena, nausea and vomiting.  Genitourinary: Negative for dysuria, frequency, hematuria and urgency.  Musculoskeletal: Positive for joint pain (arthritis). Negative for back pain, myalgias and neck pain.       Left rib pain  Skin: Negative for itching and rash.  Neurological: Positive for dizziness (vertigo) and sensory change (foot numbness s/p surgery). Negative for tingling, weakness and headaches.  Endo/Heme/Allergies: Does not bruise/bleed easily.  Psychiatric/Behavioral: Negative for depression and memory loss. The patient is not nervous/anxious and does not have insomnia.   All other systems reviewed and are negative.  Performance status (ECOG): 1-2  Vitals Blood pressure 125/74, pulse 89, temperature 98.3 F (36.8 C), temperature source Tympanic, resp. rate 18, weight 278 lb 3.5 oz (126.2 kg).   Physical Exam Vitals and nursing note reviewed.  Constitutional:      General: She is not in acute distress.    Appearance: She is not diaphoretic.  Eyes:     General: No scleral icterus.    Conjunctiva/sclera: Conjunctivae normal.  Neurological:     Mental  Status: She is alert and oriented to person, place, and time.  Psychiatric:        Behavior: Behavior normal.        Thought Content: Thought content normal.        Judgment: Judgment normal.    No visits with results within 3 Day(s) from this visit.  Latest known visit with results is:  Appointment on 04/23/2020  Component Date Value Ref Range Status  . Folate 04/23/2020 >100.0  >5.9 ng/mL Corrected   Comment: RESULT CONFIRMED BY MANUAL DILUTION HNM CORRECTED REPORT CALLED TO Sinclair Grooms RN AT 2409 ON 04/24/20 [facility] Performed at [facility], [address] [place], [place] [address] CORRECTED ON 03/01 AT 1140: PREVIOUSLY REPORTED AS 41.0 RESULT CONFIRMED BY MANUAL DILUTION HNM   . Vitamin B-12 04/23/2020 429  180 - 914 pg/mL Final   Comment: (NOTE) This assay is not validated for testing neonatal or myeloproliferative syndrome specimens for Vitamin B12 levels. Performed at [facility], [place] [address] [place], [place] [address]   . CRP 04/23/2020 0.9  <1.0 mg/dL Final   Performed at [facility] [address] [place], [place] [address]  . Sed Rate 04/23/2020 54* 0 - 30 mm/hr Final   Performed at [facility], [address] [place], [place] [address]  . Iron 04/23/2020 115  28 - 170 ug/dL Final  . TIBC 04/23/2020 315  250 - 450 ug/dL Final  . Saturation Ratios 04/23/2020 37* 10.4 - 31.8 % Final  . UIBC 04/23/2020 200  ug/dL Final   Performed at [facility], [address], [place], [place] [address]  . Ferritin 04/23/2020 107  11 - 307 ng/mL Final   Performed at [facility], [address] [place], [place] [address]  . WBC 04/23/2020 4.6  4.0 - 10.5 K/uL Final  . RBC 04/23/2020 3.79* 3.87 - 5.11 MIL/uL Final  . Hemoglobin 04/23/2020 11.4* 12.0 - 15.0 g/dL Final  . HCT 04/23/2020 35.0* 36.0 - 46.0 % Final  . MCV 04/23/2020 92.3  80.0 - 100.0 fL Final  . MCH 04/23/2020 30.1  26.0 - 34.0 pg Final  . MCHC 04/23/2020  32.6  30.0 - 36.0 g/dL Final  . RDW 04/23/2020 14.1  11.5 - 15.5 % Final  . Platelets 04/23/2020 168  150 - 400 K/uL Final  . nRBC 04/23/2020 0.0  0.0 - 0.2 % Final  . Neutrophils Relative % 04/23/2020 66  % Final  . Neutro Abs 04/23/2020 3.0  1.7 - 7.7 K/uL Final  . Lymphocytes Relative 04/23/2020 23  % Final  . Lymphs Abs 04/23/2020 1.1  0.7 - 4.0 K/uL Final  . Monocytes Relative 04/23/2020 8  % Final  . Monocytes Absolute 04/23/2020 0.4  0.1 - 1.0 K/uL Final  . Eosinophils Relative 04/23/2020 2  % Final  . Eosinophils Absolute 04/23/2020 0.1  0.0 - 0.5 K/uL Final  . Basophils Relative 04/23/2020 1  % Final  . Basophils Absolute 04/23/2020 0.0  0.0 - 0.1 K/uL Final  . Immature Granulocytes 04/23/2020 0  % Final  . Abs Immature Granulocytes 04/23/2020 0.01  0.00 - 0.07 K/uL Final   Performed at [facility], [address] [place],  [address]    Assessment:  Michele Moore is a 70 y.o. female with anemia in stage IIIb chronic kidney disease. She has Sjogren's syndrome, rheumatoid arthritis, hypertension, and proteinuria.  She takes oral iron once a day.  Labs on 03/22/2020 revealed a hematocrit 32.3, hemoglobin 10.8, MCV 91.5, platelets 166,000, WBC 7,100. Creatinine 1.79 (CrCl 32 ml/min).  Work-up on 04/23/2020 revealed a hematocrit of 35.0, hemoglobin 11.4, MCV 92.3, platelets 168,000, WBC 4,600 with an ANC of 3000. Ferritin was 107 with an iron saturation of 37% and a TIBC of 315. Sed rate was 54 and CRP was 0.9.  Vitamin B12 was 429 and folate >100.0.   Colonoscopy on 10/22/2017 for heme positive stool revealed one 7 mm polyp in the ascending colon, one 3 mm polyp in the transverse colon, and three 1 to 4 mm polyps in the sigmoid colon. There were non-bleeding internal hemorrhoids. There were petechia(e) in the rectum. Pathology showed two tubular adenomas and one hyperplastic polyp. EGD on 10/22/2017 showed gastritis and one gastric polyp.  Symptomatically, she  feels good. She denies any bleeding.  Diet is good.  She takes folic acid once daily.  Plan: 1.   Review work-up 2.   Normocytic anemia  She appears to have anemia of chronic disease.    She has a longstanding history of iron deficiency.   Ferritin is normal as well as iron saturation.  Discuss plan for Retacrit when hemoglobin < 10.   Information on Retacrit provided. 3.   Elevated folic acid   Folic acid was > 163.  Decrease folic acid to 3 days a week. 4.   RTC in 2 months for labs (CBC with diff, ferritin, iron studies). 5.   RTC in 4 months for MD assessment, labs (CBC with diff, ferritin, sed rate), and +/-Retacrit.  I discussed the assessment and treatment plan with the patient.  The patient was provided an opportunity to ask questions and all were answered.  The patient agreed with the plan and demonstrated an understanding of the instructions.  The patient was advised to call back if the symptoms worsen or if the condition fails to improve as anticipated.   Katrina Brosh C. Mike Gip, MD, PhD    04/30/2020, 2:21 PM  I, Mirian Mo Tufford, am acting as Education administrator for [employer]. Mike Gip, MD, PhD.  I, Jazalynn Mireles C. Mike Gip, MD, have reviewed the above documentation for accuracy and completeness, and I agree with the above.

## 2020-04-30 ENCOUNTER — Other Ambulatory Visit: Payer: Self-pay

## 2020-04-30 ENCOUNTER — Inpatient Hospital Stay: Payer: Medicare Other | Attending: Hematology and Oncology | Admitting: Hematology and Oncology

## 2020-04-30 ENCOUNTER — Encounter: Payer: Self-pay | Admitting: Hematology and Oncology

## 2020-04-30 VITALS — BP 125/74 | HR 89 | Temp 98.3°F | Resp 18 | Wt 278.2 lb

## 2020-04-30 DIAGNOSIS — D649 Anemia, unspecified: Secondary | ICD-10-CM

## 2020-04-30 DIAGNOSIS — N1832 Chronic kidney disease, stage 3b: Secondary | ICD-10-CM | POA: Diagnosis not present

## 2020-04-30 DIAGNOSIS — G2581 Restless legs syndrome: Secondary | ICD-10-CM | POA: Diagnosis not present

## 2020-04-30 DIAGNOSIS — E039 Hypothyroidism, unspecified: Secondary | ICD-10-CM | POA: Diagnosis not present

## 2020-04-30 DIAGNOSIS — Z7982 Long term (current) use of aspirin: Secondary | ICD-10-CM | POA: Diagnosis not present

## 2020-04-30 DIAGNOSIS — I129 Hypertensive chronic kidney disease with stage 1 through stage 4 chronic kidney disease, or unspecified chronic kidney disease: Secondary | ICD-10-CM | POA: Insufficient documentation

## 2020-04-30 DIAGNOSIS — Z8719 Personal history of other diseases of the digestive system: Secondary | ICD-10-CM | POA: Insufficient documentation

## 2020-04-30 DIAGNOSIS — Z87442 Personal history of urinary calculi: Secondary | ICD-10-CM | POA: Diagnosis not present

## 2020-04-30 DIAGNOSIS — Z79899 Other long term (current) drug therapy: Secondary | ICD-10-CM | POA: Diagnosis not present

## 2020-04-30 DIAGNOSIS — D631 Anemia in chronic kidney disease: Secondary | ICD-10-CM | POA: Insufficient documentation

## 2020-04-30 DIAGNOSIS — Z87891 Personal history of nicotine dependence: Secondary | ICD-10-CM | POA: Diagnosis not present

## 2020-04-30 DIAGNOSIS — R7989 Other specified abnormal findings of blood chemistry: Secondary | ICD-10-CM | POA: Diagnosis not present

## 2020-04-30 DIAGNOSIS — E782 Mixed hyperlipidemia: Secondary | ICD-10-CM | POA: Insufficient documentation

## 2020-04-30 DIAGNOSIS — K219 Gastro-esophageal reflux disease without esophagitis: Secondary | ICD-10-CM | POA: Insufficient documentation

## 2020-04-30 NOTE — Patient Instructions (Signed)

## 2020-05-10 ENCOUNTER — Other Ambulatory Visit: Payer: Self-pay | Admitting: Family Medicine

## 2020-05-10 DIAGNOSIS — E782 Mixed hyperlipidemia: Secondary | ICD-10-CM

## 2020-05-10 MED ORDER — OMEGA-3-ACID ETHYL ESTERS 1 G PO CAPS: 1.0000 | ORAL_CAPSULE | Freq: Two times a day (BID) | ORAL | 1 refills | Status: DC

## 2020-05-10 NOTE — Telephone Encounter (Signed)
Copied from [place] [id number]. Topic: Quick Communication - Rx Refill/Question >> May 10, 2020  1:31 PM Mcneil, Ja-Kwan wrote: Medication: omega-3 acid ethyl esters (LOVAZA) 1 g capsule  Has the patient contacted their pharmacy? yes   Preferred Pharmacy (with phone number or street name): [id number], [address] AT [address]  Phone: [phone]   Fax: [phone]  Agent: Please be advised that RX refills may take up to 3 business days. We ask that you follow-up with your pharmacy.

## 2020-05-14 DIAGNOSIS — M19071 Primary osteoarthritis, right ankle and foot: Secondary | ICD-10-CM | POA: Diagnosis not present

## 2020-05-14 DIAGNOSIS — B351 Tinea unguium: Secondary | ICD-10-CM | POA: Diagnosis not present

## 2020-05-14 DIAGNOSIS — M79674 Pain in right toe(s): Secondary | ICD-10-CM | POA: Diagnosis not present

## 2020-05-14 DIAGNOSIS — M9689 Other intraoperative and postprocedural complications and disorders of the musculoskeletal system: Secondary | ICD-10-CM | POA: Diagnosis not present

## 2020-05-14 DIAGNOSIS — L4050 Arthropathic psoriasis, unspecified: Secondary | ICD-10-CM | POA: Diagnosis not present

## 2020-05-14 DIAGNOSIS — S92254K Nondisplaced fracture of navicular [scaphoid] of right foot, subsequent encounter for fracture with nonunion: Secondary | ICD-10-CM | POA: Diagnosis not present

## 2020-05-14 DIAGNOSIS — M79675 Pain in left toe(s): Secondary | ICD-10-CM | POA: Diagnosis not present

## 2020-05-17 ENCOUNTER — Ambulatory Visit: Payer: Medicare Other | Admitting: Dermatology

## 2020-05-21 ENCOUNTER — Telehealth: Payer: Self-pay | Admitting: Student in an Organized Health Care Education/Training Program

## 2020-05-21 NOTE — Telephone Encounter (Signed)
Patient called saying she is still sick and doesn't think she can come in Thursday. Says her doctors are still trying different medications. He wants to know if she can do virtual for her medication appt. ?  hasnt actually been in office since June of 2021

## 2020-05-24 ENCOUNTER — Other Ambulatory Visit: Payer: Self-pay

## 2020-05-24 ENCOUNTER — Ambulatory Visit
Payer: Medicare Other | Attending: Student in an Organized Health Care Education/Training Program | Admitting: Student in an Organized Health Care Education/Training Program

## 2020-05-24 ENCOUNTER — Encounter: Payer: Self-pay | Admitting: Student in an Organized Health Care Education/Training Program

## 2020-05-24 DIAGNOSIS — M51369 Other intervertebral disc degeneration, lumbar region without mention of lumbar back pain or lower extremity pain: Secondary | ICD-10-CM

## 2020-05-24 DIAGNOSIS — G894 Chronic pain syndrome: Secondary | ICD-10-CM

## 2020-05-24 DIAGNOSIS — M7062 Trochanteric bursitis, left hip: Secondary | ICD-10-CM | POA: Diagnosis not present

## 2020-05-24 DIAGNOSIS — M47812 Spondylosis without myelopathy or radiculopathy, cervical region: Secondary | ICD-10-CM | POA: Diagnosis not present

## 2020-05-24 DIAGNOSIS — M47818 Spondylosis without myelopathy or radiculopathy, sacral and sacrococcygeal region: Secondary | ICD-10-CM

## 2020-05-24 DIAGNOSIS — M1712 Unilateral primary osteoarthritis, left knee: Secondary | ICD-10-CM | POA: Diagnosis not present

## 2020-05-24 DIAGNOSIS — M47816 Spondylosis without myelopathy or radiculopathy, lumbar region: Secondary | ICD-10-CM | POA: Diagnosis not present

## 2020-05-24 DIAGNOSIS — M25552 Pain in left hip: Secondary | ICD-10-CM

## 2020-05-24 DIAGNOSIS — M5136 Other intervertebral disc degeneration, lumbar region: Secondary | ICD-10-CM

## 2020-05-24 DIAGNOSIS — M461 Sacroiliitis, not elsewhere classified: Secondary | ICD-10-CM

## 2020-05-24 MED ORDER — GABAPENTIN 600 MG PO TABS
600.0000 mg | ORAL_TABLET | Freq: Every day | ORAL | 5 refills | Status: DC
Start: 2020-05-24 — End: 2020-08-23

## 2020-05-24 MED ORDER — HYDROCODONE-ACETAMINOPHEN 7.5-325 MG PO TABS: 1.0000 | ORAL_TABLET | Freq: Three times a day (TID) | ORAL | 0 refills | Status: DC | PRN

## 2020-05-24 MED ORDER — TIZANIDINE HCL 4 MG PO TABS: 4.0000 mg | ORAL_TABLET | Freq: Every day | ORAL | 2 refills | Status: DC

## 2020-05-24 NOTE — Progress Notes (Signed)
Patient: Michele Moore  Service Category: E/M  Provider: Gillis Santa, MD  DOB: 01/17/1951  DOS: 05/24/2020  Location: Office  MRN: [id number]  Setting: Ambulatory outpatient  Referring Provider: Juline Patch, MD  Type: Established Patient  Specialty: Interventional Pain Management  PCP: Juline Patch, MD  Location: Home  Delivery: TeleHealth     Virtual Encounter - Pain Management PROVIDER NOTE: Information contained herein reflects review and annotations entered in association with encounter. Interpretation of such information and data should be left to medically-trained personnel. Information provided to patient can be located elsewhere in the medical record under "Patient Instructions". Document created using STT-dictation technology, any transcriptional errors that may result from process are unintentional.    Contact & Pharmacy Preferred: [place]: [phone] (home) Mobile: [phone] (mobile) E-mail: No e-mail address on record  [place], [place] [address] [place] [address] Phone: [phone] Fax: [phone]   Pre-screening  Michele Moore offered "in-person" vs "virtual" encounter. She indicated preferring virtual for this encounter.   Reason COVID-19*  Social distancing based on CDC and AMA recommendations.   I contacted Michele Moore on 05/24/2020 via video conference.      I clearly identified myself as Gillis Santa, MD. I verified that I was speaking with the correct person using two identifiers (Name: Michele Moore, and date of birth: April 18, 1950).  Consent I sought verbal advanced consent from Michele Moore for virtual visit interactions. I informed Michele Moore of possible security and privacy concerns, risks, and limitations associated with providing "not-in-person" medical evaluation and management services. I also informed Michele Moore of the availability of "in-person" appointments.  Finally, I informed her that there would be a charge for the virtual visit and that she could be  personally, fully or partially, financially responsible for it. Michele Moore expressed understanding and agreed to proceed.   Historic Elements   Michele Moore is a 70 y.o. year old, female patient evaluated today after our last contact on 05/21/2020. Michele Moore  has a past medical history of Allergy, Anemia, Arthritis, Asthma, Cataract, Chronic kidney disease, Collagen vascular disease ([place]), Complication of anesthesia, Dyspnea, Dysrhythmia, Family history of adverse reaction to anesthesia, GERD (gastroesophageal reflux disease), Glaucoma, H/O tooth extraction, Hemorrhoid, History of hiatal hernia, History of kidney stones, Hypertension, Hypothyroidism, Migraines, Mixed hyperlipidemia, Multiple gastric ulcers, Thyroid disease, and Wears dentures. She also  has a past surgical history that includes Carpal tunnel release; Rotator cuff repair; Ankle surgery; Tubal ligation; Colonoscopy (2000?); Upper gi endoscopy (2000?); Tonsillectomy; Fusion of talonavicular joint (Right, 08/03/2015); Colonoscopy with propofol (N/A, 10/22/2017); Esophagogastroduodenoscopy (egd) with propofol (N/A, 10/22/2017); polypectomy (N/A, 10/22/2017); Total knee revision (Right, 01/20/2018); and Joint replacement (Right). Michele Moore has a current medication list which includes the following prescription(s): albuterol, allopurinol, aspirin ec, betamethasone dipropionate, betamethasone valerate, vitamin d3, diclofenac sodium, ferrous sulfate, hydralazine, ketoconazole, losartan, meclizine, methotrexate, montelukast, multiple vitamins-calcium, mupirocin ointment, omega-3 acid ethyl esters, omeprazole, polyethyl glycol-propyl glycol, potassium chloride sa, synthroid, triamcinolone, triamterene-hydrochlorothiazide, gabapentin, [START ON 05/28/2020] hydrocodone-acetaminophen, [START ON 06/27/2020] hydrocodone-acetaminophen, [START ON 07/27/2020]  hydrocodone-acetaminophen, tizanidine, [DISCONTINUED] fluticasone, and [DISCONTINUED] mometasone. She  reports that she quit smoking about 27 years ago. Her smoking use included cigarettes. She has a 40.00 pack-year smoking history. She has never used smokeless tobacco. She reports that she does not drink alcohol and does not use drugs. Michele Moore is allergic to ampicillin,  penicillins, vancomycin, and vibramycin [doxycycline calcium].   HPI  Today, she is being contacted for medication management.   VV for MM as the patient is feeling ill, and has sinus congestion.  States that she did not want to get anyone in the clinic sick. She is due for a medication refill of her hydrocodone for chronic pain syndrome.  No change in dose. She is endorsing increased left-sided buttock and SI joint pain.  I will place an order for a as needed left SI joint injection for whenever the patient wants to come in and have it repeated.  Her previous one was done 08/08/2019 which provided her greater than 65% pain relief for approximately 2 months in regards to her left sacroiliac joint and improvement in range of motion.  Pharmacotherapy Assessment  Analgesic: 04/28/2020  02/16/2020   1  Hydrocodone-Acetamin 7.5-325  90.00  30  Bi Lat  [id number]  Wal (4612)  0/0  22.50 MME  Medicare  [place]      Monitoring: [place] PMP: PDMP reviewed during this encounter.       Pharmacotherapy: No side-effects or adverse reactions reported. Compliance: No problems identified. Effectiveness: Clinically acceptable. Plan: Refer to "POC".  UDS:  Summary  Date Value Ref Range Status  05/31/2019 Note  Final    Comment:    ==================================================================== [employer] Select 13 (MW) ==================================================================== Test                             Result       Flag       Units Drug Present and Declared for Prescription Verification   Hydrocodone                    305           EXPECTED   ng/mg creat   Hydromorphone                  72           EXPECTED   ng/mg creat   Norhydrocodone                 208          EXPECTED   ng/mg creat    Sources of hydrocodone include scheduled prescription medications.    Hydromorphone and norhydrocodone are expected metabolites of    hydrocodone. Hydromorphone is also available as a scheduled    prescription medication. ==================================================================== Test                      Result    Flag   Units      Ref Range   Creatinine              131              mg/dL      >=20 ==================================================================== Declared Medications:  The flagging and interpretation on this report are based on the  following declared medications.  Unexpected results may arise from  inaccuracies in the declared medications.  **Note: The testing scope of this panel includes these medications:  Hydrocodone (Norco)  **Note: The testing scope of this panel does not include the  following reported medications:  Acetaminophen (Norco)  Albuterol  Allopurinol (Zyloprim)  Betamethasone  Calcium  Diclofenac (Voltaren)  Eye Drops  Gabapentin (Neurontin)  Hydralazine (Apresoline)  Hydrochlorothiazide (Maxzide)  Iron  Ketoconazole (Nizoral)  Levothyroxine (Synthroid)  Losartan (Cozaar)  Meclizine (Antivert)  Methotrexate  Mometasone (Nasonex)  Multivitamin  Omega-3 Fatty Acids  Omeprazole (Prilosec)  Potassium (Klor-Con)  Tizanidine (Zanaflex)  Triamcinolone (Kenalog)  Triamterene (Maxzide)  Vitamin D3 ==================================================================== For clinical consultation, please call [phone]. ====================================================================     Laboratory Chemistry Profile   Renal Lab Results  Component Value Date   BUN 30 (H) 12/07/2019   CREATININE 1.71 (H) 12/07/2019   BCR 17 04/12/2019   GFRAA 30 (L)  08/28/2019   GFRNONAA 30 (L) 12/07/2019     Hepatic Lab Results  Component Value Date   AST 25 12/07/2019   ALT 17 12/07/2019   ALBUMIN 4.1 12/07/2019   ALKPHOS 87 12/07/2019     Electrolytes Lab Results  Component Value Date   NA 141 12/07/2019   K 4.2 12/07/2019   CL 104 12/07/2019   CALCIUM 9.9 12/07/2019   MG 2.0 10/01/2018   PHOS 4.0 04/12/2019     Bone No results found for: VD25OH, VD125OH2TOT, SW1093AT5, TD3220UR4, 25OHVITD1, 25OHVITD2, 25OHVITD3, TESTOFREE, TESTOSTERONE   Inflammation (CRP: Acute Phase) (ESR: Chronic Phase) Lab Results  Component Value Date   CRP 0.9 04/23/2020   ESRSEDRATE 54 (H) 04/23/2020       Note: Above Lab results reviewed.  Imaging  US Venous Img Lower Unilateral Left CLINICAL DATA:  Left leg swelling for 3 months.  EXAM: LEFT LOWER EXTREMITY VENOUS DOPPLER ULTRASOUND  TECHNIQUE: Gray-scale sonography with compression, as well as color and duplex ultrasound, were performed to evaluate the deep venous system(s) from the level of the common femoral vein through the popliteal and proximal calf veins.  COMPARISON:  None.  FINDINGS: VENOUS  Normal compressibility of the common femoral, superficial femoral, and popliteal veins, as well as the visualized calf veins. Visualized portions of profunda femoral vein and great saphenous vein unremarkable. No filling defects to suggest DVT on grayscale or color Doppler imaging. Doppler waveforms show normal direction of venous flow, normal respiratory plasticity and response to augmentation.  Limited views of the contralateral common femoral vein are unremarkable.  OTHER  None.  Limitations: none  IMPRESSION: No evidence of left lower extremity DVT.  Electronically Signed   By: Keith Rake M.D.   On: 03/05/2020 20:26 DG Foot Complete Right CLINICAL DATA:  Pain  EXAM: RIGHT FOOT COMPLETE - 3+ VIEW  COMPARISON:  None.  FINDINGS: There is soft tissue swelling  about the foot. There is osteopenia which limits detection of nondisplaced fractures. There is no definite acute displaced fracture or dislocation. Patient has undergone prior midfoot arthrodesis. Hardware appears unchanged from prior study. Again noted are advanced midfoot degenerative changes. There is probable pes planus.  IMPRESSION: 1. Soft tissue swelling without evidence for an acute osseous abnormality. 2. Stable postsurgical and degenerative changes of the foot as detailed above.  Electronically Signed   By: Constance Holster M.D.   On: 03/05/2020 20:02  Assessment  The primary encounter diagnosis was Chronic pain syndrome. Diagnoses of Cervical facet syndrome, Trochanteric bursitis of left hip, Lumbar degenerative disc disease, Arthritis of left knee, Lumbar spondylosis, SI joint arthritis, and Left hip pain were also pertinent to this visit.  Plan of Care   [name] has a current medication list which includes the following long-term medication(s): albuterol, allopurinol, ferrous sulfate, hydralazine, losartan, montelukast, omega-3 acid ethyl esters, omeprazole, potassium chloride sa, synthroid, triamterene-hydrochlorothiazide, gabapentin, [START ON 05/28/2020] hydrocodone-acetaminophen, [START ON 06/27/2020] hydrocodone-acetaminophen, [START ON 07/27/2020] hydrocodone-acetaminophen, [DISCONTINUED] fluticasone, and [DISCONTINUED] mometasone.  Pharmacotherapy (Medications  Ordered): Meds ordered this encounter  Medications  . HYDROcodone-acetaminophen (NORCO) 7.5-325 MG tablet    Sig: Take 1 tablet by mouth every 8 (eight) hours as needed for severe pain. Must last 30 days.    Dispense:  90 tablet    Refill:  0    Chronic Pain. (STOP Act - Not applicable). Fill one day early if closed on scheduled refill date.  Marland Kitchen HYDROcodone-acetaminophen (NORCO) 7.5-325 MG tablet    Sig: Take 1 tablet by mouth every 8 (eight) hours as needed for severe pain. Must last 30 days.     Dispense:  90 tablet    Refill:  0    Chronic Pain. (STOP Act - Not applicable). Fill one day early if closed on scheduled refill date.  Marland Kitchen HYDROcodone-acetaminophen (NORCO) 7.5-325 MG tablet    Sig: Take 1 tablet by mouth every 8 (eight) hours as needed for severe pain. Must last 30 days.    Dispense:  90 tablet    Refill:  0    Chronic Pain. (STOP Act - Not applicable). Fill one day early if closed on scheduled refill date.  . gabapentin (NEURONTIN) 600 MG tablet    Sig: Take 1 tablet (600 mg total) by mouth at bedtime.    Dispense:  30 tablet    Refill:  5  . tiZANidine (ZANAFLEX) 4 MG tablet    Sig: Take 1 tablet (4 mg total) by mouth at bedtime.    Dispense:  60 tablet    Refill:  2   Orders:  Orders Placed This Encounter  Procedures  . SACROILIAC JOINT INJECTION    Scheduling timeframe: (PRN procedure) Michele Moore will call when needed. Clinical indication: Low back pain, w/ or w/o groin pain. Sacroiliac joint pain Sedation: Usually done with sedation. (May be done without sedation if so desired by patient.) Requirements: NPO x 8 hrs.; Driver; Stop blood thinners.    Standing Status:   Standing    Number of Occurrences:   1    Standing Expiration Date:   05/24/2021    Scheduling Instructions:     Side: Bilateral     Sedation: Patient's choice.    Order Specific Question:   Where will this procedure be performed?    Answer:   [facility]  . [employer] Select 13 (MW), Urine    Volume: 30 ml(s). Minimum 3 ml of urine is needed. Document temperature of fresh sample. Indications: Long term (current) use of opiate analgesic [phone])    Order Specific Question:   Release to patient    Answer:   Immediate   Follow-up plan:   Return in about 3 months (around 08/23/2020) for Medication Management, in person.   Recent Visits No visits were found meeting these conditions. Showing recent visits within past 90 days and meeting all other requirements Today's Visits Date Type  Provider Dept  05/24/20 Telemedicine Gillis Santa, MD [facility]  Showing today's visits and meeting all other requirements Future Appointments No visits were found meeting these conditions. Showing future appointments within next 90 days and meeting all other requirements  I discussed the assessment and treatment plan with the patient. The patient was provided an opportunity to ask questions and all were answered. The patient agreed with the plan and demonstrated an understanding of the instructions.  Patient advised to call back or seek an in-person evaluation if the symptoms or condition worsens.  Duration of encounter: 25mnutes.  Note by: BGillis Santa MD Date: 05/24/2020;  Time: 1:28 PM

## 2020-06-12 ENCOUNTER — Other Ambulatory Visit: Payer: Self-pay

## 2020-06-12 ENCOUNTER — Ambulatory Visit (INDEPENDENT_AMBULATORY_CARE_PROVIDER_SITE_OTHER): Payer: Medicare Other | Admitting: Family Medicine

## 2020-06-12 ENCOUNTER — Encounter: Payer: Self-pay | Admitting: Family Medicine

## 2020-06-12 VITALS — BP 130/100 | HR 60 | Ht 71.0 in | Wt 274.0 lb

## 2020-06-12 DIAGNOSIS — J069 Acute upper respiratory infection, unspecified: Secondary | ICD-10-CM

## 2020-06-12 DIAGNOSIS — E876 Hypokalemia: Secondary | ICD-10-CM | POA: Diagnosis not present

## 2020-06-12 DIAGNOSIS — N898 Other specified noninflammatory disorders of vagina: Secondary | ICD-10-CM

## 2020-06-12 DIAGNOSIS — K219 Gastro-esophageal reflux disease without esophagitis: Secondary | ICD-10-CM

## 2020-06-12 DIAGNOSIS — I10 Essential (primary) hypertension: Secondary | ICD-10-CM

## 2020-06-12 DIAGNOSIS — E039 Hypothyroidism, unspecified: Secondary | ICD-10-CM | POA: Diagnosis not present

## 2020-06-12 MED ORDER — LOSARTAN POTASSIUM 25 MG PO TABS: 25.0000 mg | ORAL_TABLET | ORAL | 1 refills | Status: DC

## 2020-06-12 MED ORDER — POTASSIUM CHLORIDE CRYS ER 20 MEQ PO TBCR: 10.0000 meq | EXTENDED_RELEASE_TABLET | Freq: Every day | ORAL | 1 refills | Status: DC

## 2020-06-12 MED ORDER — METRONIDAZOLE 500 MG PO TABS: 500.0000 mg | ORAL_TABLET | Freq: Two times a day (BID) | ORAL | 0 refills | Status: DC

## 2020-06-12 MED ORDER — TRIAMTERENE-HCTZ 75-50 MG PO TABS: 1.0000 | ORAL_TABLET | Freq: Every day | ORAL | 1 refills | Status: DC

## 2020-06-12 MED ORDER — HYDRALAZINE HCL 50 MG PO TABS
ORAL_TABLET | ORAL | 1 refills | Status: DC
Start: 2020-06-12 — End: 2020-12-03

## 2020-06-12 MED ORDER — OMEPRAZOLE 40 MG PO CPDR: 40.0000 mg | DELAYED_RELEASE_CAPSULE | Freq: Every day | ORAL | 1 refills | Status: DC

## 2020-06-12 MED ORDER — LEVOTHYROXINE SODIUM 175 MCG PO TABS: 175.0000 ug | ORAL_TABLET | Freq: Every day | ORAL | 1 refills | Status: DC

## 2020-06-12 MED ORDER — MONTELUKAST SODIUM 10 MG PO TABS: 10.0000 mg | ORAL_TABLET | Freq: Every day | ORAL | 1 refills | Status: DC

## 2020-06-12 NOTE — Progress Notes (Signed)
Date:  06/12/2020   Name:  Michele Moore   DOB:  1951-02-21   MRN:  [id number]   Chief Complaint: Hypothyroidism, Hypertension, hypokalemia, Allergic Rhinitis , and Gastroesophageal Reflux  Hypertension This is a chronic problem. The current episode started more than 1 year ago. The problem has been waxing and waning since onset. The problem is controlled. Pertinent negatives include no anxiety, blurred vision, chest pain, headaches, malaise/fatigue, neck pain, orthopnea, palpitations, peripheral edema, PND, shortness of breath or sweats. There are no associated agents to hypertension. Risk factors for coronary artery disease include dyslipidemia and diabetes mellitus. Past treatments include direct vasodilators, diuretics and angiotensin blockers. The current treatment provides moderate improvement. There are no compliance problems.  There is no history of angina, kidney disease, CAD/MI, CVA, heart failure, left ventricular hypertrophy, PVD or retinopathy. Identifiable causes of hypertension include a thyroid problem. There is no history of chronic renal disease, a hypertension causing med or renovascular disease.  Gastroesophageal Reflux She reports no abdominal pain, no chest pain, no choking, no coughing, no dysphagia, no heartburn, no hoarse voice, no nausea, no sore throat or no wheezing. The problem has been gradually improving. Nothing aggravates the symptoms. Pertinent negatives include no fatigue or weight loss. She has tried a PPI for the symptoms. The treatment provided moderate relief.  Thyroid Problem Presents for follow-up visit. Patient reports no anxiety, cold intolerance, constipation, depressed mood, diaphoresis, diarrhea, dry skin, fatigue, hair loss, heat intolerance, hoarse voice, leg swelling, menstrual problem, nail problem, palpitations, tremors, visual change, weight gain or weight loss. The symptoms have been stable. There is no history of heart failure.  URI  This is a  recurrent (for allergic rhinitis) problem. The current episode started more than 1 year ago. The problem has been waxing and waning. Associated symptoms include congestion, rhinorrhea and sneezing. Pertinent negatives include no abdominal pain, chest pain, coughing, diarrhea, dysuria, ear pain, headaches, joint pain, joint swelling, nausea, neck pain, plugged ear sensation, rash, sinus pain, sore throat, swollen glands, vomiting or wheezing. She has tried antihistamine for the symptoms. The treatment provided mild relief.  Vaginal Discharge The patient's primary symptoms include a genital odor and vaginal discharge. The patient's pertinent negatives include no genital itching, genital lesions, genital rash, missed menses, pelvic pain or vaginal bleeding. This is a chronic problem. The current episode started more than 1 month ago. The problem occurs daily. The problem has been waxing and waning. The pain is moderate. Pertinent negatives include no abdominal pain, back pain, constipation, diarrhea, dysuria, headaches, joint pain, joint swelling, nausea, rash, sore throat or vomiting.    Lab Results  Component Value Date   CREATININE 1.71 (H) 12/07/2019   BUN 30 (H) 12/07/2019   NA 141 12/07/2019   K 4.2 12/07/2019   CL 104 12/07/2019   CO2 26 12/07/2019   Lab Results  Component Value Date   CHOL 199 04/12/2019   HDL 84 04/12/2019   LDLCALC 102 (H) 04/12/2019   TRIG 69 04/12/2019   CHOLHDL 4.2 10/01/2018   Lab Results  Component Value Date   TSH 0.240 (L) 12/08/2019   Lab Results  Component Value Date   HGBA1C 5.4 10/01/2018   Lab Results  Component Value Date   WBC 4.6 04/23/2020   HGB 11.4 (L) 04/23/2020   HCT 35.0 (L) 04/23/2020   MCV 92.3 04/23/2020   PLT 168 04/23/2020   Lab Results  Component Value Date   ALT 17 12/07/2019   AST  25 12/07/2019   ALKPHOS 87 12/07/2019   BILITOT 0.7 12/07/2019     Review of Systems  Constitutional: Negative for diaphoresis, fatigue,  malaise/fatigue, weight gain and weight loss.  HENT: Positive for congestion, rhinorrhea and sneezing. Negative for ear pain, hoarse voice, sinus pain and sore throat.   Eyes: Negative for blurred vision.  Respiratory: Negative for cough, choking, shortness of breath and wheezing.   Cardiovascular: Negative for chest pain, palpitations, orthopnea and PND.  Gastrointestinal: Negative for abdominal pain, constipation, diarrhea, dysphagia, heartburn, nausea and vomiting.  Endocrine: Negative for cold intolerance and heat intolerance.  Genitourinary: Positive for vaginal discharge. Negative for dysuria, menstrual problem, missed menses and pelvic pain.  Musculoskeletal: Negative for back pain, joint pain and neck pain.  Skin: Negative for rash.  Neurological: Negative for tremors and headaches.  Psychiatric/Behavioral: The patient is not nervous/anxious.     Patient Active Problem List   Diagnosis Date Noted  . Normocytic anemia 04/22/2020  . Lumbar spondylosis 02/16/2020  . Arthritis of left knee 11/24/2019  . Left hip pain 08/08/2019  . Primary osteoarthritis of left hip 08/08/2019  . Non-seasonal allergic rhinitis 04/12/2019  . Bursitis of left shoulder 03/24/2019  . Benign essential hypertension 03/06/2019  . Proteinuria 03/06/2019  . Trochanteric bursitis of left hip 10/04/2018  . SI joint arthritis (left) 10/04/2018  . Exertional chest pain 09/30/2018  . CKD (chronic kidney disease), stage III ([place]) 09/30/2018  . Psoriatic arthritis ([place]) 04/23/2018  . Trigger ring finger of left hand 04/14/2018  . Female pelvic pain 01/20/2018  . History of revision of total knee arthroplasty 01/20/2018  . Iron deficiency anemia   . Heme + stool   . Acute gastritis without hemorrhage   . Gastric polyps   . Benign neoplasm of ascending colon   . Benign neoplasm of transverse colon   . Polyp of sigmoid colon   . Hypertension 10/01/2017  . Hypothyroidism 10/01/2017  . Gastroesophageal reflux  disease 10/01/2017  . Hypokalemia 10/01/2017  . Mixed hyperlipidemia 10/01/2017  . Reactive airway disease 10/01/2017  . Bradycardia 08/24/2017  . Severe obesity (BMI 35.0-35.9 with comorbidity) ([place]) 08/18/2017  . Chronic gouty arthropathy without tophi 06/25/2017  . Encounter for long-term (current) use of high-risk medication 06/25/2017  . Positive ANA (antinuclear antibody) 06/17/2017  . Cervical spondylosis without myelopathy 06/16/2017  . Osteoarthritis of left shoulder due to rotator cuff injury 06/16/2017  . Sprain of anterior talofibular ligament of right ankle 06/16/2017  . Lumbar radiculopathy 04/21/2017  . Lumbar degenerative disc disease 04/21/2017  . Chronic pain syndrome 04/21/2017  . Spinal stenosis, lumbar region, with neurogenic claudication 04/21/2017  . SS-A antibody positive 03/05/2017  . Chronic pain of left knee 08/03/2015  . DOE (dyspnea on exertion) 12/28/2013    Allergies  Allergen Reactions  . Ampicillin Swelling  . Penicillins Anaphylaxis and Swelling    IgE = 10 (11/05/2017)  Has patient had a PCN reaction causing immediate rash, facial/tongue/throat swelling, SOB or lightheadedness with hypotension: Yes Has patient had a PCN reaction causing severe rash involving mucus membranes or skin necrosis: No Has patient had a PCN reaction that required hospitalization: No Has patient had a PCN reaction occurring within the last 10 years: Yes If all of the above answers are "NO", then may proceed with Cephalosporin use.  . Vancomycin     Other reaction(s): Other (see comments)  . Vibramycin [Doxycycline Calcium] Rash    Past Surgical History:  Procedure Laterality Date  . ANKLE SURGERY    .  CARPAL TUNNEL RELEASE     x3  . COLONOSCOPY  2000?  . COLONOSCOPY WITH PROPOFOL N/A 10/22/2017   Procedure: COLONOSCOPY WITH PROPOFOL;  Surgeon: Lucilla Lame, MD;  Location: [facility];  Service: Endoscopy;  Laterality: N/A;  . ESOPHAGOGASTRODUODENOSCOPY  (EGD) WITH PROPOFOL N/A 10/22/2017   Procedure: ESOPHAGOGASTRODUODENOSCOPY (EGD) WITH PROPOFOL;  Surgeon: Lucilla Lame, MD;  Location: [facility];  Service: Endoscopy;  Laterality: N/A;  . FUSION OF TALONAVICULAR JOINT Right 08/03/2015   Procedure: TAILOR NAVICULAR JOINT FUSION - RIGHT ;  Surgeon: [name], DPM;  Location: [facility];  Service: Podiatry;  Laterality: Right;  . JOINT REPLACEMENT Right    knee x 3 ,right x1 and left x2  . POLYPECTOMY N/A 10/22/2017   Procedure: POLYPECTOMY;  Surgeon: Lucilla Lame, MD;  Location: [facility];  Service: Endoscopy;  Laterality: N/A;  . ROTATOR CUFF REPAIR    . TONSILLECTOMY    . TOTAL KNEE REVISION Right 01/20/2018   Procedure: POLYETHYLENE EXCHANGE;  Surgeon: Dereck Leep, MD;  Location: [facility];  Service: Orthopedics;  Laterality: Right;  . TUBAL LIGATION    . UPPER GI ENDOSCOPY  2000?    Social History   Tobacco Use  . Smoking status: Former Smoker    Packs/day: 2.00    Years: 20.00    Pack years: 40.00    Types: Cigarettes    Quit date: 02/24/1993    Years since quitting: 27.3  . Smokeless tobacco: Never Used  Vaping Use  . Vaping Use: Never used  Substance Use Topics  . Alcohol use: No    Alcohol/week: 0.0 standard drinks  . Drug use: No     Medication list has been reviewed and updated.  Current Meds  Medication Sig  . albuterol (VENTOLIN HFA) 108 (90 Base) MCG/ACT inhaler Inhale 2 puffs into the lungs every 4 (four) hours as needed for wheezing.  Marland Kitchen allopurinol (ZYLOPRIM) 100 MG tablet Take 200 mg by mouth every morning.   Marland Kitchen aspirin EC 81 MG tablet Take 81 mg by mouth daily. Swallow whole.  . betamethasone dipropionate 0.05 % cream APPLY TO PSORIASIS ON HANDS TWICE DAILY ONLY. AVOID FACE, GROIN AND AXILLA (Patient taking differently: as needed.)  . betamethasone valerate (VALISONE) 0.1 % cream Apply topically 2 (two) times daily as needed (Rash).  . Cholecalciferol (VITAMIN D3) 2000 units TABS Take  2,000 Units by mouth daily.  . diclofenac sodium (VOLTAREN) 1 % GEL Apply 2 g topically 3 (three) times daily.   . ferrous sulfate 325 (65 FE) MG tablet Take 325 mg by mouth daily with breakfast.   . gabapentin (NEURONTIN) 600 MG tablet Take 1 tablet (600 mg total) by mouth at bedtime.  . hydrALAZINE (APRESOLINE) 50 MG tablet TAKE 1 TABLET(50 MG) BY MOUTH TWICE DAILY  . HYDROcodone-acetaminophen (NORCO) 7.5-325 MG tablet Take 1 tablet by mouth every 8 (eight) hours as needed for severe pain. Must last 30 days.  Derrill Memo ON 06/27/2020] HYDROcodone-acetaminophen (NORCO) 7.5-325 MG tablet Take 1 tablet by mouth every 8 (eight) hours as needed for severe pain. Must last 30 days.  Derrill Memo ON 07/27/2020] HYDROcodone-acetaminophen (NORCO) 7.5-325 MG tablet Take 1 tablet by mouth every 8 (eight) hours as needed for severe pain. Must last 30 days.  Marland Kitchen ketoconazole (NIZORAL) 2 % cream daily as needed.   Marland Kitchen losartan (COZAAR) 25 MG tablet Take 1 tablet (25 mg total) by mouth every morning.  . meclizine (ANTIVERT) 12.5 MG tablet Take 1 tablet (  12.5 mg total) by mouth 3 (three) times daily as needed for dizziness.  . methotrexate (RHEUMATREX) 2.5 MG tablet Take 20 mg by mouth every Friday. Take 5 tablets by mouth once weekly  . montelukast (SINGULAIR) 10 MG tablet Take 1 tablet (10 mg total) by mouth at bedtime.  . Multiple Vitamins-Calcium (ONE-A-DAY WOMENS FORMULA PO) Take 1 tablet by mouth daily.  . mupirocin ointment (BACTROBAN) 2 % Apply 1 application topically 2 (two) times daily.  Marland Kitchen omega-3 acid ethyl esters (LOVAZA) 1 g capsule Take 1 capsule (1 g total) by mouth 2 (two) times daily.  Marland Kitchen omeprazole (PRILOSEC) 40 MG capsule Take 1 capsule (40 mg total) by mouth daily.  Vladimir Faster Glycol-Propyl Glycol (SYSTANE OP) Place 1 drop into both eyes daily as needed (dry eyes).   . potassium chloride SA (KLOR-CON) 20 MEQ tablet Take 0.5 tablets (10 mEq total) by mouth daily.  Marland Kitchen SYNTHROID 175 MCG tablet TAKE 1 TABLET BY  MOUTH EVERY DAY  . tiZANidine (ZANAFLEX) 4 MG tablet Take 1 tablet (4 mg total) by mouth at bedtime.  . triamcinolone (KENALOG) 0.1 % Apply 1 application topically 2 (two) times daily.  Marland Kitchen triamterene-hydrochlorothiazide (MAXZIDE) 75-50 MG tablet Take 1 tablet by mouth daily.    PHQ 2/9 Scores 06/12/2020 03/19/2020 12/08/2019 09/08/2019  PHQ - 2 Score 0 0 0 0  PHQ- 9 Score 0 - 0 0    GAD 7 : Generalized Anxiety Score 06/12/2020 12/08/2019 09/08/2019  Nervous, Anxious, on Edge 0 0 0  Control/stop worrying 0 0 0  Worry too much - different things 0 0 0  Trouble relaxing 0 0 0  Restless 0 0 0  Easily annoyed or irritable 0 0 0  Afraid - awful might happen 0 0 0  Total GAD 7 Score 0 0 0    BP Readings from Last 3 Encounters:  06/12/20 (!) 130/100  04/30/20 125/74  04/23/20 139/78    Physical Exam Vitals and nursing note reviewed.  Constitutional:      Appearance: She is well-developed.  HENT:     Head: Normocephalic.     Right Ear: External ear normal.     Left Ear: Tympanic membrane and external ear normal.     Nose: Nose normal.  Eyes:     General: Lids are everted, no foreign bodies appreciated. No scleral icterus.       Left eye: No foreign body or hordeolum.     Conjunctiva/sclera: Conjunctivae normal.     Right eye: Right conjunctiva is not injected.     Left eye: Left conjunctiva is not injected.     Pupils: Pupils are equal, round, and reactive to light.  Neck:     Thyroid: No thyromegaly.     Vascular: No JVD.     Trachea: No tracheal deviation.  Cardiovascular:     Rate and Rhythm: Normal rate and regular rhythm.     Heart sounds: Normal heart sounds. No murmur heard. No friction rub. No gallop.   Pulmonary:     Effort: Pulmonary effort is normal. No respiratory distress.     Breath sounds: Normal breath sounds. No wheezing, rhonchi or rales.  Abdominal:     General: Bowel sounds are normal.     Palpations: Abdomen is soft. There is no mass.     Tenderness:  There is no abdominal tenderness. There is no guarding or rebound.  Musculoskeletal:        General: No tenderness. Normal range of motion.  Cervical back: Normal range of motion and neck supple.  Lymphadenopathy:     Cervical: No cervical adenopathy.  Skin:    General: Skin is warm.     Findings: No rash.  Neurological:     Mental Status: She is alert and oriented to person, place, and time.     Cranial Nerves: No cranial nerve deficit.     Deep Tendon Reflexes: Reflexes normal.  Psychiatric:        Mood and Affect: Mood is not anxious or depressed.     Wt Readings from Last 3 Encounters:  06/12/20 274 lb (124.3 kg)  04/30/20 278 lb 3.5 oz (126.2 kg)  04/23/20 271 lb 2.7 oz (123 kg)    BP (!) 130/100   Pulse 60   Ht 5\' 11"  (1.803 m)   Wt 274 lb (124.3 kg)   BMI 38.22 kg/m   Assessment and Plan: 1. Hypertension, unspecified type Chronic.  Question control.  Stable.  Patient will continue Maxide 7550 once a day, losartan 25 mg every morning, and hydralazine 50 mg twice a day patient did not take medication today and blood pressure is elevated.  Patient will be returning with Hemoccult cards and we will check blood pressure at that time.  We will obtain a CMP for electrolytes and GFR. - hydrALAZINE (APRESOLINE) 50 MG tablet; TAKE 1 TABLET(50 MG) BY MOUTH TWICE DAILY  Dispense: 180 tablet; Refill: 1 - losartan (COZAAR) 25 MG tablet; Take 1 tablet (25 mg total) by mouth every morning.  Dispense: 90 tablet; Refill: 1 - triamterene-hydrochlorothiazide (MAXZIDE) 75-50 MG tablet; Take 1 tablet by mouth daily.  Dispense: 90 tablet; Refill: 1 - Comprehensive Metabolic Panel (CMET)  2. Viral upper respiratory tract infection Chronic.  Controlled.  Stable.  Continue Singulair 10 mg once a day. - montelukast (SINGULAIR) 10 MG tablet; Take 1 tablet (10 mg total) by mouth at bedtime.  Dispense: 90 tablet; Refill: 1  3. Gastroesophageal reflux disease Chronic.  Controlled.  Stable.   Continue 40 mg once a day. - omeprazole (PRILOSEC) 40 MG capsule; Take 1 capsule (40 mg total) by mouth daily.  Dispense: 90 capsule; Refill: 1  4. Hypokalemia Chronic.  Controlled.  Stable.  May take potassium chloride supplementation 0.5 tablets daily for 10 mEq.  Patient is currently on potassium sparing diuretic we will be checking potassium for current status. - potassium chloride SA (KLOR-CON) 20 MEQ tablet; Take 0.5 tablets (10 mEq total) by mouth daily.  Dispense: 45 tablet; Refill: 1  5. Hypothyroidism, unspecified type Chronic.  Controlled.  Stable.  Will evaluate with TSH and if controlled will continue levothyroxine 175 mcg daily. - levothyroxine (SYNTHROID) 175 MCG tablet; Take 1 tablet (175 mcg total) by mouth daily.  Dispense: 90 tablet; Refill: 1 - Thyroid Panel With TSH  6. Vaginal discharge New problem patient states that she has been having vaginal discharge that has been odor/fish like.  Patient is also said that it is a brown discharge and I am concerned that it may be blood.  Patient has been given Hemoccults to get a specimen to be checked for blood and if it is positive for blood we will refer to GYN for possible postmenopausal bleeding.  In the meantime we will treat with metronidazole 500 mg twice a day given the possibility of bacterial vaginosis.  If it is positive for blood or unresolved on the medication our next step is GYN referral. - metroNIDAZOLE (FLAGYL) 500 MG tablet; Take 1 tablet (500 mg  total) by mouth 2 (two) times daily.  Dispense: 14 tablet; Refill: 0

## 2020-06-13 LAB — THYROID PANEL WITH TSH
Free Thyroxine Index: 2.4 (ref 1.2–4.9)
T3 Uptake Ratio: 25 % (ref 24–39)
T4, Total: 9.5 ug/dL (ref 4.5–12.0)
TSH: 1.28 u[IU]/mL (ref 0.450–4.500)

## 2020-06-13 LAB — COMPREHENSIVE METABOLIC PANEL
ALT: 16 IU/L (ref 0–32)
AST: 25 IU/L (ref 0–40)
Albumin/Globulin Ratio: 2 (ref 1.2–2.2)
Albumin: 4.4 g/dL (ref 3.7–4.7)
Alkaline Phosphatase: 98 IU/L (ref 44–121)
BUN/Creatinine Ratio: 18 (ref 12–28)
BUN: 30 mg/dL — ABNORMAL HIGH (ref 8–27)
Bilirubin Total: 0.3 mg/dL (ref 0.0–1.2)
CO2: 25 mmol/L (ref 20–29)
Calcium: 9.9 mg/dL (ref 8.7–10.3)
Chloride: 106 mmol/L (ref 96–106)
Creatinine, Ser: 1.64 mg/dL — ABNORMAL HIGH (ref 0.57–1.00)
Globulin, Total: 2.2 g/dL (ref 1.5–4.5)
Glucose: 91 mg/dL (ref 65–99)
Potassium: 4.3 mmol/L (ref 3.5–5.2)
Sodium: 144 mmol/L (ref 134–144)
Total Protein: 6.6 g/dL (ref 6.0–8.5)
eGFR: 33 mL/min/{1.73_m2} — ABNORMAL LOW (ref >59)

## 2020-06-14 DIAGNOSIS — G894 Chronic pain syndrome: Secondary | ICD-10-CM | POA: Diagnosis not present

## 2020-06-21 LAB — TOXASSURE SELECT 13 (MW), URINE

## 2020-06-27 ENCOUNTER — Other Ambulatory Visit: Payer: Self-pay

## 2020-06-27 ENCOUNTER — Other Ambulatory Visit (INDEPENDENT_AMBULATORY_CARE_PROVIDER_SITE_OTHER): Payer: Medicare Other

## 2020-06-27 DIAGNOSIS — K2900 Acute gastritis without bleeding: Secondary | ICD-10-CM

## 2020-06-27 LAB — HEMOCCULT GUIAC POC 1CARD (OFFICE)
Card #2 Fecal Occult Blod, POC: NEGATIVE
Card #3 Fecal Occult Blood, POC: NEGATIVE
Fecal Occult Blood, POC: NEGATIVE

## 2020-07-02 ENCOUNTER — Inpatient Hospital Stay: Payer: Medicare Other | Attending: Nurse Practitioner

## 2020-07-02 ENCOUNTER — Other Ambulatory Visit: Payer: Self-pay

## 2020-07-02 DIAGNOSIS — K5900 Constipation, unspecified: Secondary | ICD-10-CM | POA: Insufficient documentation

## 2020-07-02 DIAGNOSIS — D649 Anemia, unspecified: Secondary | ICD-10-CM

## 2020-07-02 LAB — CBC WITH DIFFERENTIAL/PLATELET
Abs Immature Granulocytes: 0.01 10*3/uL (ref 0.00–0.07)
Basophils Absolute: 0 10*3/uL (ref 0.0–0.1)
Basophils Relative: 1 %
Eosinophils Absolute: 0.1 10*3/uL (ref 0.0–0.5)
Eosinophils Relative: 3 %
HCT: 33.5 % — ABNORMAL LOW (ref 36.0–46.0)
Hemoglobin: 11.2 g/dL — ABNORMAL LOW (ref 12.0–15.0)
Immature Granulocytes: 0 %
Lymphocytes Relative: 24 %
Lymphs Abs: 0.9 10*3/uL (ref 0.7–4.0)
MCH: 30.7 pg (ref 26.0–34.0)
MCHC: 33.4 g/dL (ref 30.0–36.0)
MCV: 91.8 fL (ref 80.0–100.0)
Monocytes Absolute: 0.3 10*3/uL (ref 0.1–1.0)
Monocytes Relative: 7 %
Neutro Abs: 2.4 10*3/uL (ref 1.7–7.7)
Neutrophils Relative %: 65 %
Platelets: 168 10*3/uL (ref 150–400)
RBC: 3.65 MIL/uL — ABNORMAL LOW (ref 3.87–5.11)
RDW: 14.4 % (ref 11.5–15.5)
WBC: 3.7 10*3/uL — ABNORMAL LOW (ref 4.0–10.5)
nRBC: 0 % (ref 0.0–0.2)

## 2020-07-02 LAB — IRON AND TIBC
Iron: 106 ug/dL (ref 28–170)
Saturation Ratios: 38 % — ABNORMAL HIGH (ref 10.4–31.8)
TIBC: 283 ug/dL (ref 250–450)
UIBC: 177 ug/dL

## 2020-07-02 LAB — FERRITIN: Ferritin: 124 ng/mL (ref 11–307)

## 2020-07-26 DIAGNOSIS — N1832 Chronic kidney disease, stage 3b: Secondary | ICD-10-CM | POA: Diagnosis not present

## 2020-07-26 DIAGNOSIS — D631 Anemia in chronic kidney disease: Secondary | ICD-10-CM | POA: Diagnosis not present

## 2020-07-26 DIAGNOSIS — I10 Essential (primary) hypertension: Secondary | ICD-10-CM | POA: Diagnosis not present

## 2020-07-26 DIAGNOSIS — N2581 Secondary hyperparathyroidism of renal origin: Secondary | ICD-10-CM | POA: Diagnosis not present

## 2020-08-13 ENCOUNTER — Other Ambulatory Visit: Payer: Self-pay | Admitting: Family Medicine

## 2020-08-13 NOTE — Telephone Encounter (Signed)
   Notes to clinic: scripts requested have expired  Review for continued use and refill    Requested Prescriptions  Pending Prescriptions Disp Refills   albuterol (VENTOLIN HFA) 108 (90 Base) MCG/ACT inhaler [Pharmacy Med Name: ALBUTEROL HFA INH(200 PUFFS)18GM] 18 g     Sig: INHALE 2 PUFFS INTO THE LUNGS EVERY 4 HOURS AS NEEDED FOR WHEEZING      Pulmonology:  Beta Agonists Failed - 08/13/2020 10:55 AM      Failed - One inhaler should last at least one month. If the patient is requesting refills earlier, contact the patient to check for uncontrolled symptoms.      Passed - Valid encounter within last 12 months    Recent Outpatient Visits           2 months ago Hypertension, unspecified type   [facility] Juline Patch, MD   4 months ago Viral upper respiratory tract infection   [facility] Juline Patch, MD   8 months ago Hypertension, unspecified type   [facility] Juline Patch, MD   11 months ago Labyrinthitis of both [facility] Juline Patch, MD   1 year ago Hypertension, unspecified type   [facility] Juline Patch, MD       Future Appointments             In 1 week Ralene Bathe, MD [facility]               meclizine (ANTIVERT) 12.5 MG tablet [Pharmacy Med Name: MECLIZINE 12.5MG  (RX) TABLETS] 30 tablet 0    Sig: TAKE 1 TABLET(12.5 MG) BY MOUTH THREE TIMES DAILY AS NEEDED FOR DIZZINESS      Not Delegated - Gastroenterology: Antiemetics Failed - 08/13/2020 10:55 AM      Failed - This refill cannot be delegated      Passed - Valid encounter within last 6 months    Recent Outpatient Visits           2 months ago Hypertension, unspecified type   [facility] Juline Patch, MD   4 months ago Viral upper respiratory tract infection   [facility] Juline Patch, MD   8 months ago Hypertension, unspecified type   [facility] Juline Patch, MD   11  months ago Labyrinthitis of both ears   [facility] Juline Patch, MD   1 year ago Hypertension, unspecified type   [facility] Juline Patch, MD       Future Appointments             In 1 week Ralene Bathe, MD [facility]

## 2020-08-23 ENCOUNTER — Ambulatory Visit (INDEPENDENT_AMBULATORY_CARE_PROVIDER_SITE_OTHER): Payer: Medicare Other | Admitting: Dermatology

## 2020-08-23 ENCOUNTER — Ambulatory Visit
Payer: Medicare Other | Attending: Student in an Organized Health Care Education/Training Program | Admitting: Student in an Organized Health Care Education/Training Program

## 2020-08-23 ENCOUNTER — Ambulatory Visit: Payer: Medicare Other | Admitting: Dermatology

## 2020-08-23 ENCOUNTER — Other Ambulatory Visit: Payer: Self-pay

## 2020-08-23 ENCOUNTER — Encounter: Payer: Self-pay | Admitting: Student in an Organized Health Care Education/Training Program

## 2020-08-23 VITALS — BP 145/54 | HR 100 | Temp 97.5°F | Resp 16 | Ht 71.0 in | Wt 268.0 lb

## 2020-08-23 DIAGNOSIS — M19112 Post-traumatic osteoarthritis, left shoulder: Secondary | ICD-10-CM | POA: Diagnosis not present

## 2020-08-23 DIAGNOSIS — M545 Low back pain, unspecified: Secondary | ICD-10-CM | POA: Insufficient documentation

## 2020-08-23 DIAGNOSIS — M1712 Unilateral primary osteoarthritis, left knee: Secondary | ICD-10-CM | POA: Diagnosis not present

## 2020-08-23 DIAGNOSIS — L409 Psoriasis, unspecified: Secondary | ICD-10-CM

## 2020-08-23 DIAGNOSIS — M1612 Unilateral primary osteoarthritis, left hip: Secondary | ICD-10-CM | POA: Insufficient documentation

## 2020-08-23 DIAGNOSIS — M25552 Pain in left hip: Secondary | ICD-10-CM | POA: Insufficient documentation

## 2020-08-23 DIAGNOSIS — G8929 Other chronic pain: Secondary | ICD-10-CM | POA: Diagnosis not present

## 2020-08-23 DIAGNOSIS — S46002S Unspecified injury of muscle(s) and tendon(s) of the rotator cuff of left shoulder, sequela: Secondary | ICD-10-CM | POA: Insufficient documentation

## 2020-08-23 DIAGNOSIS — M5136 Other intervertebral disc degeneration, lumbar region: Secondary | ICD-10-CM | POA: Insufficient documentation

## 2020-08-23 DIAGNOSIS — M7062 Trochanteric bursitis, left hip: Secondary | ICD-10-CM | POA: Diagnosis not present

## 2020-08-23 DIAGNOSIS — M47818 Spondylosis without myelopathy or radiculopathy, sacral and sacrococcygeal region: Secondary | ICD-10-CM | POA: Diagnosis not present

## 2020-08-23 DIAGNOSIS — M47816 Spondylosis without myelopathy or radiculopathy, lumbar region: Secondary | ICD-10-CM | POA: Diagnosis not present

## 2020-08-23 DIAGNOSIS — M19012 Primary osteoarthritis, left shoulder: Secondary | ICD-10-CM | POA: Diagnosis not present

## 2020-08-23 DIAGNOSIS — G894 Chronic pain syndrome: Secondary | ICD-10-CM | POA: Diagnosis not present

## 2020-08-23 DIAGNOSIS — M47812 Spondylosis without myelopathy or radiculopathy, cervical region: Secondary | ICD-10-CM | POA: Insufficient documentation

## 2020-08-23 MED ORDER — HYDROCODONE-ACETAMINOPHEN 7.5-325 MG PO TABS: 1.0000 | ORAL_TABLET | Freq: Three times a day (TID) | ORAL | 0 refills | Status: DC | PRN

## 2020-08-23 MED ORDER — TIZANIDINE HCL 4 MG PO TABS: 4.0000 mg | ORAL_TABLET | Freq: Every day | ORAL | 2 refills | Status: DC

## 2020-08-23 MED ORDER — GABAPENTIN 600 MG PO TABS: 600.0000 mg | ORAL_TABLET | Freq: Every day | ORAL | 5 refills | Status: DC

## 2020-08-23 MED ORDER — CALCIPOTRIENE 0.005 % EX CREA
TOPICAL_CREAM | CUTANEOUS | 0 refills | Status: DC
Start: 2020-08-23 — End: 2021-03-28

## 2020-08-23 NOTE — Progress Notes (Signed)
Nursing Pain Medication Assessment:  Safety precautions to be maintained throughout the outpatient stay will include: orient to surroundings, keep bed in low position, maintain call bell within reach at all times, provide assistance with transfer out of bed and ambulation.  Medication Inspection Compliance: Pill count conducted under aseptic conditions, in front of the patient. Neither the pills nor the bottle was removed from the patient's sight at any time. Once count was completed pills were immediately returned to the patient in their original bottle.  Medication: Hydrocodone/APAP Pill/Patch Count:  14 of 90 pills remain Pill/Patch Appearance: Markings consistent with prescribed medication Bottle Appearance: Standard pharmacy container. Clearly labeled. Filled Date: 06 / 04 / 2022 Last Medication intake:  Today

## 2020-08-23 NOTE — Patient Instructions (Addendum)
Side effects of Otezla (apremilast) include diarrhea, nausea, headache, upper respiratory infection, depression, and weight decrease (5-10%). It should only be taken by pregnant women after a discussion regarding risks and benefits with their doctor. Goal is control of skin condition, not cure.  The use of Otezla requires long term medication management, including periodic office visits.   If you have any questions or concerns for your doctor, please call our main line at [phone] and press option 4 to reach your doctor's medical assistant. If no one answers, please leave a voicemail as directed and we will return your call as soon as possible. Messages left after 4 pm will be answered the following business day.   You may also send us a message via MyChart. We typically respond to MyChart messages within 1-2 business days.  For prescription refills, please ask your pharmacy to contact our office. Our fax number is [phone].  If you have an urgent issue when the clinic is closed that cannot wait until the next business day, you can page your doctor at the number below.    Please note that while we do our best to be available for urgent issues outside of office hours, we are not available 24/7.   If you have an urgent issue and are unable to reach us, you may choose to seek medical care at your doctor's office, retail clinic, urgent care center, or emergency room.  If you have a medical emergency, please immediately call 911 or go to the emergency department.  Pager Numbers  - Dr. Kowalski: [phone]  - Dr. Moye: [phone]  - Dr. Stewart: [phone]  In the event of inclement weather, please call our main line at [phone] for an update on the status of any delays or closures.  Dermatology Medication Tips: Please keep the boxes that topical medications come in in order to help keep track of the instructions about where and how to use these. Pharmacies typically print the  medication instructions only on the boxes and not directly on the medication tubes.   If your medication is too expensive, please contact our office at [phone] option 4 or send us a message through MyChart.   We are unable to tell what your co-pay for medications will be in advance as this is different depending on your insurance coverage. However, we may be able to find a substitute medication at lower cost or fill out paperwork to get insurance to cover a needed medication.   If a prior authorization is required to get your medication covered by your insurance company, please allow us 1-2 business days to complete this process.  Drug prices often vary depending on where the prescription is filled and some pharmacies may offer cheaper prices.  The website www.goodrx.com contains coupons for medications through different pharmacies. The prices here do not account for what the cost may be with help from insurance (it may be cheaper with your insurance), but the website can give you the price if you did not use any insurance.  - You can print the associated coupon and take it with your prescription to the pharmacy.  - You may also stop by our office during regular business hours and pick up a GoodRx coupon card.  - If you need your prescription sent electronically to a different pharmacy, notify our office through [facility] MyChart or by phone at [phone] option 4.  

## 2020-08-23 NOTE — Progress Notes (Signed)
PROVIDER NOTE: Information contained herein reflects review and annotations entered in association with encounter. Interpretation of such information and data should be left to medically-trained personnel. Information provided to patient can be located elsewhere in the medical record under "Patient Instructions". Document created using STT-dictation technology, any transcriptional errors that may result from process are unintentional.    Patient: Michele Moore  Service Category: E/M  Provider: Gillis Santa, MD  DOB: 10-Sep-1950  DOS: 08/23/2020  Specialty: Interventional Pain Management  MRN: [id number]  Setting: Ambulatory outpatient  PCP: Juline Patch, MD  Type: Established Patient    Referring Provider: Juline Patch, MD  Location: Office  Delivery: Face-to-face     HPI  Michele Moore, a 70 y.o. year old female, is here today because of her Chronic pain syndrome [G89.4]. [name]'s primary complain today is Knee Pain (Left s/p joint replacement ), Back Pain (Lumbar left ), Neck Pain (Midline ), and Ankle Pain (Left ) Last encounter: My last encounter with her was on 05/21/2020. Pertinent problems: [name] has Chronic pain of left knee; Lumbar radiculopathy; Lumbar degenerative disc disease; Chronic pain syndrome; Spinal stenosis, lumbar region, with neurogenic claudication; Cervical spondylosis without myelopathy; Osteoarthritis of left shoulder due to rotator cuff injury; Sprain of anterior talofibular ligament of right ankle; Severe obesity (BMI 35.0-35.9 with comorbidity) ([place]); CKD (chronic kidney disease), stage III ([place]); Primary osteoarthritis of left hip; and Lumbar spondylosis on their pertinent problem list. Pain Assessment: Severity of Chronic pain is reported as a 10-Worst pain ever/10. Location: Knee (see visit info for additional sites.) Left/neck pain into shoulders, back pain into left hip. Onset: More than a month ago. Quality: Discomfort, Constant, Sharp. Timing: Constant.  Modifying factor(s):  Marland Kitchen Vitals:  height is _0  (1.803 m) and weight is 268 lb (121.6 kg). Her temporal temperature is 97.5 F (36.4 C) (abnormal). Her blood pressure is 145/54 (abnormal) and her pulse is 100. Her respiration is 16 and oxygen saturation is 97%.   Reason for encounter:   [name] presents today for medication management, medication refill of her hydrocodone as well as increased neck pain related to cervical facet arthropathy and cervical spondylosis.  It has been over 14 months since she has had a cervical facet medial branch nerve block and states that it is time to repeat that.  Provides her with 50 to 65% pain relief for 2 to 3 months and improvement in her neck range of motion.  She does have limited left lateral rotation related to cervical facet arthropathy.  I will refill her chronic pain regimen as below.  No change in dose.  She also endorses bilateral hip pain related to hip osteoarthritis, bursitis as well as referred pain from lumbar degenerative changes.  Pharmacotherapy Assessment  Analgesic: Norco 7.5 mg TID PRN #90/month, 22.5    Monitoring: [place] PMP: PDMP reviewed during this encounter.       Pharmacotherapy: No side-effects or adverse reactions reported. Compliance: No problems identified. Effectiveness: Clinically acceptable.  Janett Billow, RN  08/23/2020  1:50 PM  Sign when Signing Visit Nursing Pain Medication Assessment:  Safety precautions to be maintained throughout the outpatient stay will include: orient to surroundings, keep bed in low position, maintain call bell within reach at all times, provide assistance with transfer out of bed and ambulation.  Medication Inspection Compliance: Pill count conducted under aseptic conditions, in front of the patient. Neither the pills nor the bottle was removed from the patient's sight  at any time. Once count was completed pills were immediately returned to the patient in their original  bottle.  Medication: Hydrocodone/APAP Pill/Patch Count:  14 of 90 pills remain Pill/Patch Appearance: Markings consistent with prescribed medication Bottle Appearance: Standard pharmacy container. Clearly labeled. Filled Date: 06 / 04 / 2022 Last Medication intake:  Today      UDS:  Summary  Date Value Ref Range Status  06/14/2020 Note  Final    Comment:    ==================================================================== [employer] (MW) ==================================================================== Test                             Result       Flag       Units  Drug Present and Declared for Prescription Verification   Hydrocodone                    1019         EXPECTED   ng/mg creat   Hydromorphone                  166          EXPECTED   ng/mg creat   Dihydrocodeine                 71           EXPECTED   ng/mg creat   Norhydrocodone                 536          EXPECTED   ng/mg creat    Sources of hydrocodone include scheduled prescription medications.    Hydromorphone, dihydrocodeine and norhydrocodone are expected    metabolites of hydrocodone. Hydromorphone and dihydrocodeine are    also available as scheduled prescription medications.  ==================================================================== Test                      Result    Flag   Units      Ref Range   Creatinine              166              mg/dL      >=20 ==================================================================== Declared Medications:  The flagging and interpretation on this report are based on the  following declared medications.  Unexpected results may arise from  inaccuracies in the declared medications.   **Note: The testing scope of this panel includes these medications:   Hydrocodone (Norco)   **Note: The testing scope of this panel does not include the  following reported medications:   Acetaminophen (Norco)  Albuterol (Ventolin HFA)  Allopurinol  (Zyloprim)  Aspirin  Betamethasone  Fluticasone (Flonase)  Gabapentin (Neurontin)  Hydralazine (Apresoline)  Hydrochlorothiazide (Maxzide)  Iron  Ketoconazole (Nizoral)  Levothyroxine (Synthroid)  Losartan (Cozaar)  Meclizine (Antivert)  Methotrexate  Metronidazole (Flagyl)  Mometasone (Nasonex)  Montelukast (Singulair)  Multivitamin  Mupirocin (Bactroban)  Omega-3 Fatty Acids  Omeprazole (Prilosec)  Polyethylene Glycol  Potassium (Klor-Con)  Tizanidine (Zanaflex)  Topical Diclofenac (Voltaren)  Triamcinolone (Kenalog)  Triamterene (Maxzide)  Vitamin D3 ==================================================================== For clinical consultation, please call [phone]. ====================================================================      ROS  Constitutional: Denies any fever or chills Gastrointestinal: No reported hemesis, hematochezia, vomiting, or acute GI distress Musculoskeletal:  Diffuse arthralgias and myalgias, cervical spine pain, lumbar spine pain, bilateral hip pain Neurological: No reported episodes of acute onset apraxia,  aphasia, dysarthria, agnosia, amnesia, paralysis, loss of coordination, or loss of consciousness  Medication Review  HYDROcodone-acetaminophen, Multiple Vitamins-Calcium, Polyethyl Glycol-Propyl Glycol, Vitamin D3, albuterol, allopurinol, aspirin EC, betamethasone dipropionate, betamethasone valerate, diclofenac sodium, ferrous sulfate, fluticasone, gabapentin, hydrALAZINE, ketoconazole, levothyroxine, losartan, meclizine, methotrexate, metroNIDAZOLE, mometasone, montelukast, mupirocin ointment, omega-3 acid ethyl esters, omeprazole, potassium chloride SA, tiZANidine, triamcinolone cream, and triamterene-hydrochlorothiazide  History Review  Allergy: Ms. Ransom is allergic to ampicillin, penicillins, vancomycin, and vibramycin [doxycycline calcium]. Drug: Ms. Jupiter  reports no history of drug use. Alcohol:  reports no history of alcohol  use. Tobacco:  reports that she quit smoking about 27 years ago. Her smoking use included cigarettes. She has a 40.00 pack-year smoking history. She has never used smokeless tobacco. Social: Ms. Mayberry  reports that she quit smoking about 27 years ago. Her smoking use included cigarettes. She has a 40.00 pack-year smoking history. She has never used smokeless tobacco. She reports that she does not drink alcohol and does not use drugs. Medical:  has a past medical history of Allergy, Anemia, Arthritis, Asthma, Cataract, Chronic kidney disease, Collagen vascular disease ([place]), Complication of anesthesia, Dyspnea, Dysrhythmia, Family history of adverse reaction to anesthesia, GERD (gastroesophageal reflux disease), Glaucoma, H/O tooth extraction, Hemorrhoid, History of hiatal hernia, History of kidney stones, Hypertension, Hypothyroidism, Migraines, Mixed hyperlipidemia, Multiple gastric ulcers, Thyroid disease, and Wears dentures. Surgical: Ms. Raynes  has a past surgical history that includes Carpal tunnel release; Rotator cuff repair; Ankle surgery; Tubal ligation; Colonoscopy (2000?); Upper gi endoscopy (2000?); Tonsillectomy; Fusion of talonavicular joint (Right, 08/03/2015); Colonoscopy with propofol (N/A, 10/22/2017); Esophagogastroduodenoscopy (egd) with propofol (N/A, 10/22/2017); polypectomy (N/A, 10/22/2017); Total knee revision (Right, 01/20/2018); and Joint replacement (Right). Family: family history includes Arthritis in her mother; COPD in her sister; Cancer in her maternal aunt and maternal uncle; Diabetes in her father; Gout in her mother; Heart failure in her father and mother; Hyperlipidemia in her father; Hypertension in her mother; Stroke in her mother.  Laboratory Chemistry Profile   Renal Lab Results  Component Value Date   BUN 30 (H) 06/12/2020   CREATININE 1.64 (H) 06/12/2020   BCR 18 06/12/2020   GFRAA 30 (L) 08/28/2019   GFRNONAA 30 (L) 12/07/2019    Hepatic Lab Results  Component  Value Date   AST 25 06/12/2020   ALT 16 06/12/2020   ALBUMIN 4.4 06/12/2020   ALKPHOS 98 06/12/2020    Electrolytes Lab Results  Component Value Date   NA 144 06/12/2020   K 4.3 06/12/2020   CL 106 06/12/2020   CALCIUM 9.9 06/12/2020   MG 2.0 10/01/2018   PHOS 4.0 04/12/2019    Bone No results found for: VD25OH, VD125OH2TOT, [id number], [id number], 25OHVITD1, 25OHVITD2, 25OHVITD3, TESTOFREE, TESTOSTERONE  Inflammation (CRP: Acute Phase) (ESR: Chronic Phase) Lab Results  Component Value Date   CRP 0.9 04/23/2020   ESRSEDRATE 54 (H) 04/23/2020          Note: Above Lab results reviewed.  Recent Imaging Review  US Venous Img Lower Unilateral Left CLINICAL DATA:  Left leg swelling for 3 months.  EXAM: LEFT LOWER EXTREMITY VENOUS DOPPLER ULTRASOUND  TECHNIQUE: Gray-scale sonography with compression, as well as color and duplex ultrasound, were performed to evaluate the deep venous system(s) from the level of the common femoral vein through the popliteal and proximal calf veins.  COMPARISON:  None.  FINDINGS: VENOUS  Normal compressibility of the common femoral, superficial femoral, and popliteal veins, as well as the visualized calf veins. Visualized portions of profunda  femoral vein and great saphenous vein unremarkable. No filling defects to suggest DVT on grayscale or color Doppler imaging. Doppler waveforms show normal direction of venous flow, normal respiratory plasticity and response to augmentation.  Limited views of the contralateral common femoral vein are unremarkable.  OTHER  None.  Limitations: none  IMPRESSION: No evidence of left lower extremity DVT.  Electronically Signed   By: Keith Rake M.D.   On: 03/05/2020 20:26 DG Foot Complete Right CLINICAL DATA:  Pain  EXAM: RIGHT FOOT COMPLETE - 3+ VIEW  COMPARISON:  None.  FINDINGS: There is soft tissue swelling about the foot. There is osteopenia which limits detection of  nondisplaced fractures. There is no definite acute displaced fracture or dislocation. Patient has undergone prior midfoot arthrodesis. Hardware appears unchanged from prior study. Again noted are advanced midfoot degenerative changes. There is probable pes planus.  IMPRESSION: 1. Soft tissue swelling without evidence for an acute osseous abnormality. 2. Stable postsurgical and degenerative changes of the foot as detailed above.  Electronically Signed   By: Constance Holster M.D.   On: 03/05/2020 20:02 Note: Reviewed        Physical Exam  General appearance: Well nourished, well developed, and well hydrated. In no apparent acute distress Mental status: Alert, oriented x 3 (person, place, & time)       Respiratory: No evidence of acute respiratory distress Eyes: PERLA Vitals: BP (!) 145/54   Pulse 100   Temp (!) 97.5 F (36.4 C) (Temporal)   Resp 16   Ht _0  (1.803 m)   Wt 268 lb (121.6 kg)   SpO2 97%   BMI 37.38 kg/m  BMI: Estimated body mass index is 37.38 kg/m as calculated from the following:   Height as of this encounter: _1  (1.803 m).   Weight as of this encounter: 268 lb (121.6 kg). Ideal: Ideal body weight: 70.8 kg (156 lb 1.4 oz) Adjusted ideal body weight: 91.1 kg (200 lb 13.6 oz)    Cervical Spine Area Exam  Skin & Axial Inspection: No masses, redness, edema, swelling, or associated skin lesions Alignment: Symmetrical Functional ROM: Diminished ROM      Stability: No instability detected Muscle Tone/Strength: Functionally intact. No obvious neuro-muscular anomalies detected. Sensory (Neurological): Articular pain pattern and musculoskeletal Palpation: Complains of area being tender to palpation                     Upper Extremity (UE) Exam      Side: Right upper extremity   Side: Left upper extremity   Skin & Extremity Inspection: Edema of right wrist   Skin & Extremity Inspection: Skin color, temperature, and hair growth are WNL. No peripheral  edema or cyanosis. No masses, redness, swelling, asymmetry, or associated skin lesions. No contractures.   Functional ROM: Decreased ROM for shoulder and elbow   Functional ROM: Unrestricted ROM           Muscle Tone/Strength: Functionally intact. No obvious neuro-muscular anomalies detected.   Muscle Tone/Strength: Functionally intact. No obvious neuro-muscular anomalies detected.   Sensory (Neurological): Musculoskeletal pain pattern           Sensory (Neurological): Unimpaired           Palpation: No palpable anomalies               Palpation: No palpable anomalies               Provocative Test(s):  Phalen's test: deferred Tinel's test:  deferred Apley's scratch test (touch opposite shoulder):  Action 1 (Across chest): Decreased ROM Action 2 (Overhead): Decreased ROM Action 3 (LB reach): Decreased ROM     Provocative Test(s):  Phalen's test: deferred Tinel's test: deferred Apley's scratch test (touch opposite shoulder):  Action 1 (Across chest): Decreased ROM Action 2 (Overhead): Decreased ROM Action 3 (LB reach): Decreased ROM       Thoracic Spine Area Exam  Skin & Axial Inspection: No masses, redness, or swelling Alignment: Symmetrical Functional ROM: Unrestricted ROM Stability: No instability detected Muscle Tone/Strength: Functionally intact. No obvious neuro-muscular anomalies detected. Sensory (Neurological): Unimpaired Muscle strength & Tone: No palpable anomalies   Lumbar Spine Area Exam  Skin & Axial Inspection: No masses, redness, or swelling Alignment: Symmetrical Functional ROM: Improved after treatment       Stability: No instability detected Muscle Tone/Strength: Functionally intact. No obvious neuro-muscular anomalies detected. Sensory (Neurological): Dermatomal pain pattern Palpation: No palpable anomalies       Provocative Tests: Hyperextension/rotation test: deferred today       Lumbar quadrant test (Kemp's test): deferred today       Lateral bending  test: Improved after treatment       Patrick's Maneuver: deferred today                   FABER test: deferred today                   S-I anterior distraction/compression test: deferred today         S-I lateral compression test: deferred today         S-I Thigh-thrust test: deferred today         S-I Gaenslen's test: deferred today           Gait & Posture Assessment  Ambulation: Limited Gait: Antalgic Posture: Difficulty standing up straight, due to pain    Lower Extremity Exam      Side: Right lower extremity   Side: Left lower extremity  Stability: No instability observed           Stability: No instability observed          Skin & Extremity Inspection: Skin color, temperature, and hair growth are WNL. No peripheral edema or cyanosis. No masses, redness, swelling, asymmetry, or associated skin lesions. No contractures.   Skin & Extremity Inspection: Skin color, temperature, and hair growth are WNL. No peripheral edema or cyanosis. No masses, redness, swelling, asymmetry, or associated skin lesions. No contractures.  Functional ROM: Unrestricted ROM                   Functional ROM: Decreased ROM for hip and knee joints          Muscle Tone/Strength: Functionally intact. No obvious neuro-muscular anomalies detected.   Muscle Tone/Strength: Functionally intact. No obvious neuro-muscular anomalies detected.  Sensory (Neurological): Unimpaired   Sensory (Neurological): Dermatomal pain pattern and arthropathic  Palpation: No palpable anomalies   Palpation: No palpable anomalies    Assessment   Status Diagnosis  Controlled Having a Flare-up Having a Flare-up 1. Chronic pain syndrome   2. Cervical facet syndrome   3. Trochanteric bursitis of left hip   4. Lumbar degenerative disc disease   5. Arthritis of left knee   6. Lumbar spondylosis   7. SI joint arthritis   8. Left hip pain   9. Osteoarthritis of left shoulder due to rotator cuff injury   10. Primary osteoarthritis of left  hip   11. Primary osteoarthritis of left shoulder   12. Chronic bilateral low back pain without sciatica      Updated Problems: Problem  Lumbar Spondylosis  Primary Osteoarthritis of Left Hip  Ckd (Chronic Kidney Disease), Stage III ([facility])  Severe Obesity (Bmi 35.0-35.9 With Comorbidity) ([facility])  Cervical Spondylosis Without Myelopathy  Osteoarthritis of Left Shoulder Due to Rotator Cuff Injury  Sprain of Anterior Talofibular Ligament of Right Ankle  Lumbar Radiculopathy  Lumbar Degenerative Disc Disease  Chronic Pain Syndrome  Spinal Stenosis, Lumbar Region, With Neurogenic Claudication  Chronic Pain of Left Knee    Plan of Care    Michele Moore has a current medication list which includes the following long-term medication(s): albuterol, allopurinol, ferrous sulfate, hydralazine, levothyroxine, losartan, montelukast, omega-3 acid ethyl esters, omeprazole, potassium chloride sa, triamterene-hydrochlorothiazide, gabapentin, [START ON 08/26/2020] hydrocodone-acetaminophen, [START ON 09/25/2020] hydrocodone-acetaminophen, [START ON 10/25/2020] hydrocodone-acetaminophen, [DISCONTINUED] fluticasone, and [DISCONTINUED] mometasone.  Increase in neck pain related to cervical facet joint syndrome.  Previous cervical facet medial branch nerve block 04/25/2019.  Provided approximately 65 to 70% pain relief for 3 months and improvement in range of motion.  Discussed repeating.  Risk benefits reviewed and patient would like to proceed. We will refill chronic analgesic regimen as below, gabapentin, tizanidine, no change in dose. UDS up-to-date and appropriate.   Pharmacotherapy (Medications Ordered): Meds ordered this encounter  Medications   HYDROcodone-acetaminophen (NORCO) 7.5-325 MG tablet    Sig: Take 1 tablet by mouth every 8 (eight) hours as needed for severe pain. Must last 30 days.    Dispense:  90 tablet    Refill:  0    Chronic Pain. (STOP Act - Not applicable). Fill one day early if  closed on scheduled refill date.   HYDROcodone-acetaminophen (NORCO) 7.5-325 MG tablet    Sig: Take 1 tablet by mouth every 8 (eight) hours as needed for severe pain. Must last 30 days.    Dispense:  90 tablet    Refill:  0    Chronic Pain. (STOP Act - Not applicable). Fill one day early if closed on scheduled refill date.   HYDROcodone-acetaminophen (NORCO) 7.5-325 MG tablet    Sig: Take 1 tablet by mouth every 8 (eight) hours as needed for severe pain. Must last 30 days.    Dispense:  90 tablet    Refill:  0    Chronic Pain. (STOP Act - Not applicable). Fill one day early if closed on scheduled refill date.   gabapentin (NEURONTIN) 600 MG tablet    Sig: Take 1 tablet (600 mg total) by mouth at bedtime.    Dispense:  30 tablet    Refill:  5   tiZANidine (ZANAFLEX) 4 MG tablet    Sig: Take 1 tablet (4 mg total) by mouth at bedtime.    Dispense:  60 tablet    Refill:  2   Orders:  Orders Placed This Encounter  Procedures   CERVICAL FACET (MEDIAL BRANCH NERVE BLOCK)     Standing Status:   Future    Standing Expiration Date:   09/22/2020    Scheduling Instructions:     Side: Bilateral     Level: C3-4, C4-5, C5-6 Facet joints (C4, C5, C6, Medial Branch Nerves)     Sedation: Patient's choice.     Timeframe: As soon as schedule allows    Order Specific Question:   Where will this procedure be performed?    Answer:   [facility]  Follow-up plan:   Return in about 2 weeks (around 09/06/2020) for C4,5,6 facets with sedation (B/L).    Recent Visits No visits were found meeting these conditions. Showing recent visits within past 90 days and meeting all other requirements Today's Visits Date Type Provider Dept  08/23/20 Office Visit Gillis Santa, MD [facility]  Showing today's visits and meeting all other requirements Future Appointments Date Type Provider Dept  09/17/20 Appointment Gillis Santa, MD [facility]  11/15/20 Appointment Gillis Santa, MD  [facility]  Showing future appointments within next 90 days and meeting all other requirements I discussed the assessment and treatment plan with the patient. The patient was provided an opportunity to ask questions and all were answered. The patient agreed with the plan and demonstrated an understanding of the instructions.  Patient advised to call back or seek an in-person evaluation if the symptoms or condition worsens.  Duration of encounter: 9mnutes.  Note by: BGillis Santa MD Date: 08/23/2020; Time: 2:32 PM

## 2020-08-23 NOTE — Progress Notes (Signed)
   Follow-Up Visit   Subjective  Michele Moore is a 70 y.o. female who presents for the following: Psoriasis (6 months f/u Psoriasis on the legs, treating with Betamethasone cream with poor response ). Pt report she need the psoriasis areas on the skin clear so she can get a knee replacement   The following portions of the chart were reviewed this encounter and updated as appropriate:   Tobacco  Allergies  Meds  Problems  Med Hx  Surg Hx  Fam Hx      Review of Systems:  No other skin or systemic complaints except as noted in HPI or Assessment and Plan.  Objective  Well appearing patient in no apparent distress; mood and affect are within normal limits.  A focused examination was performed including extremities, including the arms, hands, fingers, and fingernails and the legs, feet, toes, and toenails. Relevant physical exam findings are noted in the Assessment and Plan. legs, feet Well-demarcated erythematous papules/plaques with silvery scale.              Assessment & Plan  Psoriasis -severe persistent and with flare.  Not responding to current topical treatment. legs, feet  Psoriasis is a chronic non-curable, but treatable genetic/hereditary disease that may have other systemic features affecting other organ systems such as joints (Psoriatic Arthritis). It is associated with an increased risk of inflammatory bowel disease, heart disease, non-alcoholic fatty liver disease, and depression.     Start sample package of [place] Nail titratiring up to 30 mg daily  Side effects of Otezla (apremilast) include diarrhea, nausea, headache, upper respiratory infection, depression, and weight decrease (5-10%). It should only be taken by pregnant women after a discussion regarding risks and benefits with their doctor. Goal is control of skin condition, not cure.  The use of [place] Nail requires long term medication management, including periodic office visits.   Start Calcipotriene cream  apply to psoriasis areas on legs and feet daily Cont Betamethasone cream apply to legs and feet bid   Related Medications betamethasone valerate (VALISONE) 0.1 % cream Apply topically 2 (two) times daily as needed (Rash).  calcipotriene (DOVONOX) 0.005 % cream Apply to psoriasis areas on legs and feet daily once or twice a day  Return in about 1 month (around 09/22/2020) for Psoriasis.  IMarye Round, CMA, am acting as scribe for Sarina Ser, MD .  Documentation: I have reviewed the above documentation for accuracy and completeness, and I agree with the above.  Sarina Ser, MD

## 2020-08-29 ENCOUNTER — Other Ambulatory Visit: Payer: Self-pay | Admitting: Oncology

## 2020-08-30 ENCOUNTER — Other Ambulatory Visit: Payer: Self-pay

## 2020-08-30 ENCOUNTER — Ambulatory Visit: Payer: Medicare Other

## 2020-08-30 ENCOUNTER — Inpatient Hospital Stay: Payer: Medicare Other | Attending: Oncology

## 2020-08-30 ENCOUNTER — Encounter: Payer: Self-pay | Admitting: Oncology

## 2020-08-30 ENCOUNTER — Inpatient Hospital Stay (HOSPITAL_BASED_OUTPATIENT_CLINIC_OR_DEPARTMENT_OTHER): Payer: Medicare Other | Admitting: Oncology

## 2020-08-30 ENCOUNTER — Ambulatory Visit (INDEPENDENT_AMBULATORY_CARE_PROVIDER_SITE_OTHER): Payer: Medicare Other | Admitting: Family Medicine

## 2020-08-30 ENCOUNTER — Encounter: Payer: Self-pay | Admitting: Family Medicine

## 2020-08-30 VITALS — BP 142/68 | HR 90 | Ht 71.0 in | Wt 277.0 lb

## 2020-08-30 VITALS — BP 134/62 | HR 66 | Temp 97.2°F | Resp 18 | Wt 277.2 lb

## 2020-08-30 DIAGNOSIS — D649 Anemia, unspecified: Secondary | ICD-10-CM

## 2020-08-30 DIAGNOSIS — Z8261 Family history of arthritis: Secondary | ICD-10-CM | POA: Insufficient documentation

## 2020-08-30 DIAGNOSIS — N3090 Cystitis, unspecified without hematuria: Secondary | ICD-10-CM

## 2020-08-30 DIAGNOSIS — Z836 Family history of other diseases of the respiratory system: Secondary | ICD-10-CM | POA: Insufficient documentation

## 2020-08-30 DIAGNOSIS — Z8249 Family history of ischemic heart disease and other diseases of the circulatory system: Secondary | ICD-10-CM | POA: Diagnosis not present

## 2020-08-30 DIAGNOSIS — Z8349 Family history of other endocrine, nutritional and metabolic diseases: Secondary | ICD-10-CM | POA: Insufficient documentation

## 2020-08-30 DIAGNOSIS — Z8051 Family history of malignant neoplasm of kidney: Secondary | ICD-10-CM | POA: Insufficient documentation

## 2020-08-30 DIAGNOSIS — R350 Frequency of micturition: Secondary | ICD-10-CM

## 2020-08-30 DIAGNOSIS — M069 Rheumatoid arthritis, unspecified: Secondary | ICD-10-CM | POA: Diagnosis not present

## 2020-08-30 DIAGNOSIS — Z803 Family history of malignant neoplasm of breast: Secondary | ICD-10-CM | POA: Diagnosis not present

## 2020-08-30 DIAGNOSIS — E039 Hypothyroidism, unspecified: Secondary | ICD-10-CM | POA: Insufficient documentation

## 2020-08-30 DIAGNOSIS — E785 Hyperlipidemia, unspecified: Secondary | ICD-10-CM | POA: Insufficient documentation

## 2020-08-30 DIAGNOSIS — Z87891 Personal history of nicotine dependence: Secondary | ICD-10-CM | POA: Insufficient documentation

## 2020-08-30 DIAGNOSIS — K219 Gastro-esophageal reflux disease without esophagitis: Secondary | ICD-10-CM | POA: Diagnosis not present

## 2020-08-30 DIAGNOSIS — Z823 Family history of stroke: Secondary | ICD-10-CM | POA: Insufficient documentation

## 2020-08-30 DIAGNOSIS — M2550 Pain in unspecified joint: Secondary | ICD-10-CM | POA: Insufficient documentation

## 2020-08-30 DIAGNOSIS — I10 Essential (primary) hypertension: Secondary | ICD-10-CM | POA: Diagnosis not present

## 2020-08-30 DIAGNOSIS — Z79899 Other long term (current) drug therapy: Secondary | ICD-10-CM | POA: Insufficient documentation

## 2020-08-30 DIAGNOSIS — R21 Rash and other nonspecific skin eruption: Secondary | ICD-10-CM | POA: Insufficient documentation

## 2020-08-30 DIAGNOSIS — R0602 Shortness of breath: Secondary | ICD-10-CM | POA: Insufficient documentation

## 2020-08-30 DIAGNOSIS — M3500 Sicca syndrome, unspecified: Secondary | ICD-10-CM | POA: Diagnosis not present

## 2020-08-30 DIAGNOSIS — D631 Anemia in chronic kidney disease: Secondary | ICD-10-CM | POA: Insufficient documentation

## 2020-08-30 DIAGNOSIS — K9089 Other intestinal malabsorption: Secondary | ICD-10-CM | POA: Diagnosis not present

## 2020-08-30 DIAGNOSIS — N898 Other specified noninflammatory disorders of vagina: Secondary | ICD-10-CM

## 2020-08-30 DIAGNOSIS — R809 Proteinuria, unspecified: Secondary | ICD-10-CM | POA: Insufficient documentation

## 2020-08-30 DIAGNOSIS — N1832 Chronic kidney disease, stage 3b: Secondary | ICD-10-CM | POA: Diagnosis not present

## 2020-08-30 LAB — CBC WITH DIFFERENTIAL/PLATELET
Abs Immature Granulocytes: 0.02 10*3/uL (ref 0.00–0.07)
Basophils Absolute: 0 10*3/uL (ref 0.0–0.1)
Basophils Relative: 1 %
Eosinophils Absolute: 0.1 10*3/uL (ref 0.0–0.5)
Eosinophils Relative: 3 %
HCT: 31.1 % — ABNORMAL LOW (ref 36.0–46.0)
Hemoglobin: 10.3 g/dL — ABNORMAL LOW (ref 12.0–15.0)
Immature Granulocytes: 0 %
Lymphocytes Relative: 23 %
Lymphs Abs: 1 10*3/uL (ref 0.7–4.0)
MCH: 30.7 pg (ref 26.0–34.0)
MCHC: 33.1 g/dL (ref 30.0–36.0)
MCV: 92.6 fL (ref 80.0–100.0)
Monocytes Absolute: 0.4 10*3/uL (ref 0.1–1.0)
Monocytes Relative: 9 %
Neutro Abs: 2.9 10*3/uL (ref 1.7–7.7)
Neutrophils Relative %: 64 %
Platelets: 169 10*3/uL (ref 150–400)
RBC: 3.36 MIL/uL — ABNORMAL LOW (ref 3.87–5.11)
RDW: 14.4 % (ref 11.5–15.5)
WBC: 4.5 10*3/uL (ref 4.0–10.5)
nRBC: 0 % (ref 0.0–0.2)

## 2020-08-30 LAB — POCT URINALYSIS DIPSTICK
Bilirubin, UA: NEGATIVE
Blood, UA: NEGATIVE
Glucose, UA: NEGATIVE
Ketones, UA: NEGATIVE
Nitrite, UA: NEGATIVE
Protein, UA: NEGATIVE
Spec Grav, UA: 1.015 (ref 1.010–1.025)
Urobilinogen, UA: 0.2 E.U./dL
pH, UA: 6.5 (ref 5.0–8.0)

## 2020-08-30 LAB — RETIC PANEL
Immature Retic Fract: 28.7 % — ABNORMAL HIGH (ref 2.3–15.9)
RBC.: 3.28 MIL/uL — ABNORMAL LOW (ref 3.87–5.11)
Retic Count, Absolute: 58.1 10*3/uL (ref 19.0–186.0)
Retic Ct Pct: 1.8 % (ref 0.4–3.1)
Reticulocyte Hemoglobin: 35.1 pg (ref >27.9)

## 2020-08-30 LAB — IRON AND TIBC
Iron: 67 ug/dL (ref 28–170)
Saturation Ratios: 24 % (ref 10.4–31.8)
TIBC: 276 ug/dL (ref 250–450)
UIBC: 209 ug/dL

## 2020-08-30 LAB — FERRITIN: Ferritin: 106 ng/mL (ref 11–307)

## 2020-08-30 LAB — SEDIMENTATION RATE: Sed Rate: 45 mm/hr — ABNORMAL HIGH (ref 0–30)

## 2020-08-30 MED ORDER — NITROFURANTOIN MONOHYD MACRO 100 MG PO CAPS: 100.0000 mg | ORAL_CAPSULE | Freq: Two times a day (BID) | ORAL | 0 refills | Status: AC

## 2020-08-30 NOTE — Progress Notes (Addendum)
Date:  08/30/2020   Name:  Michele Moore   DOB:  03-21-1950   MRN:  [id number]   Chief Complaint: Urinary Tract Infection (Hurts to urinate and lower back pain)  Urinary Tract Infection  This is a new problem. The current episode started in the past 7 days. The problem has been gradually worsening. The quality of the pain is described as aching. The pain is mild. There has been no fever. Associated symptoms include frequency and urgency. Pertinent negatives include no chills, discharge, flank pain, hematuria, hesitancy, nausea, sweats or vomiting. The treatment provided mild relief.   Lab Results  Component Value Date   CREATININE 1.64 (H) 06/12/2020   BUN 30 (H) 06/12/2020   NA 144 06/12/2020   K 4.3 06/12/2020   CL 106 06/12/2020   CO2 25 06/12/2020   Lab Results  Component Value Date   CHOL 199 04/12/2019   HDL 84 04/12/2019   LDLCALC 102 (H) 04/12/2019   TRIG 69 04/12/2019   CHOLHDL 4.2 10/01/2018   Lab Results  Component Value Date   TSH 1.280 06/12/2020   Lab Results  Component Value Date   HGBA1C 5.4 10/01/2018   Lab Results  Component Value Date   WBC 4.5 08/30/2020   HGB 10.3 (L) 08/30/2020   HCT 31.1 (L) 08/30/2020   MCV 92.6 08/30/2020   PLT 169 08/30/2020   Lab Results  Component Value Date   ALT 16 06/12/2020   AST 25 06/12/2020   ALKPHOS 98 06/12/2020   BILITOT 0.3 06/12/2020     Review of Systems  Constitutional:  Negative for chills and fever.  HENT:  Negative for drooling, ear discharge, ear pain and sore throat.   Respiratory:  Negative for cough, shortness of breath and wheezing.   Cardiovascular:  Negative for chest pain, palpitations and leg swelling.  Gastrointestinal:  Negative for abdominal pain, blood in stool, constipation, diarrhea, nausea and vomiting.  Endocrine: Negative for polydipsia.  Genitourinary:  Positive for frequency and urgency. Negative for dysuria, flank pain, hematuria and hesitancy.  Musculoskeletal:  Negative  for back pain, myalgias and neck pain.  Skin:  Negative for rash.  Allergic/Immunologic: Negative for environmental allergies.  Neurological:  Negative for dizziness and headaches.  Hematological:  Does not bruise/bleed easily.  Psychiatric/Behavioral:  Negative for suicidal ideas. The patient is not nervous/anxious.    Patient Active Problem List   Diagnosis Date Noted   Normocytic anemia 04/22/2020   Lumbar spondylosis 02/16/2020   Arthritis of left knee 11/24/2019   Left hip pain 08/08/2019   Primary osteoarthritis of left hip 08/08/2019   Non-seasonal allergic rhinitis 04/12/2019   Bursitis of left shoulder 03/24/2019   Benign essential hypertension 03/06/2019   Proteinuria 03/06/2019   Trochanteric bursitis of left hip 10/04/2018   SI joint arthritis (left) 10/04/2018   Exertional chest pain 09/30/2018   CKD (chronic kidney disease), stage III ([place]) 09/30/2018   Psoriatic arthritis ([place]) 04/23/2018   Trigger ring finger of left hand 04/14/2018   Female pelvic pain 01/20/2018   History of revision of total knee arthroplasty 01/20/2018   Iron deficiency anemia    Heme + stool    Acute gastritis without hemorrhage    Gastric polyps    Benign neoplasm of ascending colon    Benign neoplasm of transverse colon    Polyp of sigmoid colon    Hypertension 10/01/2017   Hypothyroidism 10/01/2017   Gastroesophageal reflux disease 10/01/2017   Hypokalemia 10/01/2017  Mixed hyperlipidemia 10/01/2017   Reactive airway disease 10/01/2017   Bradycardia 08/24/2017   Severe obesity (BMI 35.0-35.9 with comorbidity) ([place]) 08/18/2017   Chronic gouty arthropathy without tophi 06/25/2017   Encounter for long-term (current) use of high-risk medication 06/25/2017   Positive ANA (antinuclear antibody) 06/17/2017   Cervical spondylosis without myelopathy 06/16/2017   Osteoarthritis of left shoulder due to rotator cuff injury 06/16/2017   Sprain of anterior talofibular ligament of right ankle  06/16/2017   Lumbar radiculopathy 04/21/2017   Lumbar degenerative disc disease 04/21/2017   Chronic pain syndrome 04/21/2017   Spinal stenosis, lumbar region, with neurogenic claudication 04/21/2017   SS-A antibody positive 03/05/2017   Chronic pain of left knee 08/03/2015   DOE (dyspnea on exertion) 12/28/2013    Allergies  Allergen Reactions   Ampicillin Swelling   Penicillins Anaphylaxis and Swelling    IgE = 10 (11/05/2017)  Has patient had a PCN reaction causing immediate rash, facial/tongue/throat swelling, SOB or lightheadedness with hypotension: Yes Has patient had a PCN reaction causing severe rash involving mucus membranes or skin necrosis: No Has patient had a PCN reaction that required hospitalization: No Has patient had a PCN reaction occurring within the last 10 years: Yes If all of the above answers are "NO", then may proceed with Cephalosporin use.   Vancomycin     Other reaction(s): Other (see comments)   Vibramycin [Doxycycline Calcium] Rash    Past Surgical History:  Procedure Laterality Date   ANKLE SURGERY     CARPAL TUNNEL RELEASE     x3   COLONOSCOPY  2000?   COLONOSCOPY WITH PROPOFOL N/A 10/22/2017   Procedure: COLONOSCOPY WITH PROPOFOL;  Surgeon: Lucilla Lame, MD;  Location: [place];  Service: Endoscopy;  Laterality: N/A;   ESOPHAGOGASTRODUODENOSCOPY (EGD) WITH PROPOFOL N/A 10/22/2017   Procedure: ESOPHAGOGASTRODUODENOSCOPY (EGD) WITH PROPOFOL;  Surgeon: Lucilla Lame, MD;  Location: [place];  Service: Endoscopy;  Laterality: N/A;   FUSION OF TALONAVICULAR JOINT Right 08/03/2015   Procedure: TAILOR NAVICULAR JOINT FUSION - RIGHT ;  Surgeon: [name], DPM;  Location: [facility];  Service: Podiatry;  Laterality: Right;   JOINT REPLACEMENT Right    knee x 3 ,right x1 and left x2   POLYPECTOMY N/A 10/22/2017   Procedure: POLYPECTOMY;  Surgeon: Lucilla Lame, MD;  Location: [place];  Service: Endoscopy;  Laterality: N/A;    ROTATOR CUFF REPAIR     TONSILLECTOMY     TOTAL KNEE REVISION Right 01/20/2018   Procedure: POLYETHYLENE EXCHANGE;  Surgeon: Dereck Leep, MD;  Location: [facility];  Service: Orthopedics;  Laterality: Right;   TUBAL LIGATION     UPPER GI ENDOSCOPY  2000?    Social History   Tobacco Use   Smoking status: Former    Packs/day: 2.00    Years: 20.00    Pack years: 40.00    Types: Cigarettes    Quit date: 02/24/1993    Years since quitting: 27.5   Smokeless tobacco: Never  Vaping Use   Vaping Use: Never used  Substance Use Topics   Alcohol use: No    Alcohol/week: 0.0 standard drinks   Drug use: No     Medication list has been reviewed and updated.  Current Meds  Medication Sig   albuterol (VENTOLIN HFA) 108 (90 Base) MCG/ACT inhaler INHALE 2 PUFFS INTO THE LUNGS EVERY 4 HOURS AS NEEDED FOR WHEEZING   allopurinol (ZYLOPRIM) 100 MG tablet Take 200 mg by mouth every morning.  Apremilast (OTEZLA PO) Take by mouth.   aspirin EC 81 MG tablet Take 81 mg by mouth daily. Swallow whole.   betamethasone dipropionate 0.05 % cream APPLY TO PSORIASIS ON HANDS TWICE DAILY ONLY. AVOID FACE, GROIN AND AXILLA (Patient taking differently: as needed.)   betamethasone valerate (VALISONE) 0.1 % cream Apply topically 2 (two) times daily as needed (Rash).   calcipotriene (DOVONOX) 0.005 % cream Apply to psoriasis areas on legs and feet daily once or twice a day   Cholecalciferol (VITAMIN D3) 2000 units TABS Take 2,000 Units by mouth daily.   diclofenac sodium (VOLTAREN) 1 % GEL Apply 2 g topically 3 (three) times daily.    ferrous sulfate 325 (65 FE) MG tablet Take 325 mg by mouth daily with breakfast.    gabapentin (NEURONTIN) 600 MG tablet Take 1 tablet (600 mg total) by mouth at bedtime.   hydrALAZINE (APRESOLINE) 50 MG tablet TAKE 1 TABLET(50 MG) BY MOUTH TWICE DAILY   HYDROcodone-acetaminophen (NORCO) 7.5-325 MG tablet Take 1 tablet by mouth every 8 (eight) hours as needed for severe pain. Must  last 30 days.   [START ON 10/25/2020] HYDROcodone-acetaminophen (NORCO) 7.5-325 MG tablet Take 1 tablet by mouth every 8 (eight) hours as needed for severe pain. Must last 30 days.   ketoconazole (NIZORAL) 2 % cream daily as needed.    levothyroxine (SYNTHROID) 175 MCG tablet Take 1 tablet (175 mcg total) by mouth daily.   losartan (COZAAR) 25 MG tablet Take 1 tablet (25 mg total) by mouth every morning.   meclizine (ANTIVERT) 12.5 MG tablet TAKE 1 TABLET(12.5 MG) BY MOUTH THREE TIMES DAILY AS NEEDED FOR DIZZINESS   methotrexate (RHEUMATREX) 2.5 MG tablet Take 20 mg by mouth every Friday. Take 5 tablets by mouth once weekly   montelukast (SINGULAIR) 10 MG tablet Take 1 tablet (10 mg total) by mouth at bedtime.   Multiple Vitamins-Calcium (ONE-A-DAY WOMENS FORMULA PO) Take 1 tablet by mouth daily.   mupirocin ointment (BACTROBAN) 2 % Apply 1 application topically 2 (two) times daily.   omega-3 acid ethyl esters (LOVAZA) 1 g capsule Take 1 capsule (1 g total) by mouth 2 (two) times daily.   omeprazole (PRILOSEC) 40 MG capsule Take 1 capsule (40 mg total) by mouth daily.   Polyethyl Glycol-Propyl Glycol (SYSTANE OP) Place 1 drop into both eyes daily as needed (dry eyes).    potassium chloride SA (KLOR-CON) 20 MEQ tablet Take 0.5 tablets (10 mEq total) by mouth daily.   tiZANidine (ZANAFLEX) 4 MG tablet Take 1 tablet (4 mg total) by mouth at bedtime.   triamcinolone (KENALOG) 0.1 % Apply 1 application topically 2 (two) times daily.   triamterene-hydrochlorothiazide (MAXZIDE) 75-50 MG tablet Take 1 tablet by mouth daily.   [DISCONTINUED] metroNIDAZOLE (FLAGYL) 500 MG tablet Take 1 tablet (500 mg total) by mouth 2 (two) times daily.    PHQ 2/9 Scores 06/12/2020 03/19/2020 12/08/2019 09/08/2019  PHQ - 2 Score 0 0 0 0  PHQ- 9 Score 0 - 0 0    GAD 7 : Generalized Anxiety Score 06/12/2020 12/08/2019 09/08/2019  Nervous, Anxious, on Edge 0 0 0  Control/stop worrying 0 0 0  Worry too much - different things  0 0 0  Trouble relaxing 0 0 0  Restless 0 0 0  Easily annoyed or irritable 0 0 0  Afraid - awful might happen 0 0 0  Total GAD 7 Score 0 0 0    BP Readings from Last 3 Encounters:  08/30/20 Marland Kitchen)  142/68  08/30/20 134/62  08/23/20 (!) 145/54    Physical Exam Vitals and nursing note reviewed.  Constitutional:      General: She is not in acute distress.    Appearance: She is not diaphoretic.  HENT:     Head: Normocephalic and atraumatic.     Right Ear: Tympanic membrane and external ear normal. There is no impacted cerumen.     Left Ear: Tympanic membrane and external ear normal. There is no impacted cerumen.     Nose: Nose normal.  Eyes:     General:        Right eye: No discharge.        Left eye: No discharge.     Conjunctiva/sclera: Conjunctivae normal.     Pupils: Pupils are equal, round, and reactive to light.  Neck:     Thyroid: No thyromegaly.     Vascular: No JVD.  Cardiovascular:     Rate and Rhythm: Normal rate and regular rhythm.     Heart sounds: Normal heart sounds. No murmur heard.   No friction rub. No gallop.  Pulmonary:     Effort: Pulmonary effort is normal.     Breath sounds: Normal breath sounds.  Abdominal:     General: Bowel sounds are normal.     Palpations: Abdomen is soft. There is no hepatomegaly, splenomegaly or mass.     Tenderness: There is abdominal tenderness in the suprapubic area. There is no guarding.  Musculoskeletal:        General: Normal range of motion.     Cervical back: Normal range of motion and neck supple.  Lymphadenopathy:     Cervical: No cervical adenopathy.  Skin:    General: Skin is warm and dry.  Neurological:     Mental Status: She is alert.     Deep Tendon Reflexes: Reflexes are normal and symmetric.    Wt Readings from Last 3 Encounters:  08/30/20 277 lb (125.6 kg)  08/30/20 277 lb 3.7 oz (125.8 kg)  08/23/20 268 lb (121.6 kg)    BP (!) 142/68   Pulse 90 Comment: irregular  Ht 5\' 11"  (1.803 m)   Wt 277  lb (125.6 kg)   SpO2 98%   BMI 38.63 kg/m   Assessment and Plan:  1. Urine frequency Patient has urinary frequency and dysuria as well as discomfort in the suprapubic area and patient needs to have a urinalysis for evaluation - POCT Urinalysis Dipstick  2. Cystitis Point-of-care urinalysis is consistent with a UTI and we will treat with Macrobid 100 mg twice a day for 5 days. - nitrofurantoin, macrocrystal-monohydrate, (MACROBID) 100 MG capsule; Take 1 capsule (100 mg total) by mouth 2 (two) times daily for 5 days.  Dispense: 10 capsule; Refill: 0   3.  Vaginal discharge patient has noticed a brownish vaginal discharge and I have concerns that this may be some postmenopausal bleeding.  I have encouraged patient to contact her gynecologist and have this reevaluated as where she has had it before. We will refer to GYN patient would like to stay on medicine.

## 2020-08-30 NOTE — Addendum Note (Signed)
Addended by: Earlie Server on: 08/30/2020 09:59 PM   Modules accepted: Orders

## 2020-08-30 NOTE — Progress Notes (Signed)
[facility]  [address], [address] [place], [place] [address] Phone: [phone]  Fax: [phone]   Clinic Day:  08/30/2020  Referring physician: Juline Patch, MD  Chief Complaint: Michele Moore is a 70 y.o. female presents for anemia in stage IIIb chronic kidney disease   PERTINENT ONCOLOGY HISTORY Michele Moore is a 70 y.o.afemale who has above oncology history reviewed by me today presented for follow up visit for management of anemia in CKD  PERTINENT HEMATOLOGY HISTORY Patient previously followed up by Dr.Corcoran, patient switched care to me on 08/30/20 Extensive medical record review was performed by me  # anemia in stage IIIb chronic kidney disease.  Work-up on 04/23/2020 revealed a hematocrit of 35.0, hemoglobin 11.4, MCV 92.3, platelets 168,000, WBC 4,600 with an ANC of 3000. Ferritin was 107 with an iron saturation of 37% and a TIBC of 315. Sed rate was 54 and CRP was 0.9.  Vitamin B12 was 429 and folate >100.0.   10/22/2017 Colonoscopy for heme positive stool revealed one 7 mm polyp in the ascending colon, one 3 mm polyp in the transverse colon, and three 1 to 4 mm polyps in the sigmoid colon. There were non-bleeding internal hemorrhoids. There were petechia(e) in the rectum. Pathology showed two tubular adenomas and one hyperplastic polyp. EGD on 10/22/2017 showed gastritis and one gastric polyp.  She has Sjogren's syndrome, rheumatoid arthritis, hypertension, and proteinuria.  She takes oral iron once a day.    Past Medical History:  Diagnosis Date   Allergy    Anemia    Arthritis    Asthma    Cataract    Chronic kidney disease    STAGE 3 PER DR EASON 08/02/15   Collagen vascular disease ([place])    Complication of anesthesia    WOKE UP DURING COLONOSCOPY   Dyspnea    Dysrhythmia    IRREGULAR HEART BEAT   Family history of adverse reaction to anesthesia    sister difficult to put to sleep   GERD (gastroesophageal reflux disease)     Glaucoma    H/O tooth extraction    all lower teeth 1/19   Hemorrhoid    History of hiatal hernia    History of kidney stones    H/O   Hypertension    Hypothyroidism    Migraines    Mixed hyperlipidemia    Multiple gastric ulcers    Thyroid disease    Wears dentures    partial upper and lower    Past Surgical History:  Procedure Laterality Date   ANKLE SURGERY     CARPAL TUNNEL RELEASE     x3   COLONOSCOPY  2000?   COLONOSCOPY WITH PROPOFOL N/A 10/22/2017   Procedure: COLONOSCOPY WITH PROPOFOL;  Surgeon: Lucilla Lame, MD;  Location: [facility];  Service: Endoscopy;  Laterality: N/A;   ESOPHAGOGASTRODUODENOSCOPY (EGD) WITH PROPOFOL N/A 10/22/2017   Procedure: ESOPHAGOGASTRODUODENOSCOPY (EGD) WITH PROPOFOL;  Surgeon: Lucilla Lame, MD;  Location: [facility];  Service: Endoscopy;  Laterality: N/A;   FUSION OF TALONAVICULAR JOINT Right 08/03/2015   Procedure: TAILOR NAVICULAR JOINT FUSION - RIGHT ;  Surgeon: [name], DPM;  Location: [facility];  Service: Podiatry;  Laterality: Right;   JOINT REPLACEMENT Right    knee x 3 ,right x1 and left x2   POLYPECTOMY N/A 10/22/2017   Procedure: POLYPECTOMY;  Surgeon: Lucilla Lame, MD;  Location: [place] [facility];  Service: Endoscopy;  Laterality: N/A;   ROTATOR CUFF REPAIR  TONSILLECTOMY     TOTAL KNEE REVISION Right 01/20/2018   Procedure: POLYETHYLENE EXCHANGE;  Surgeon: Dereck Leep, MD;  Location: [facility];  Service: Orthopedics;  Laterality: Right;   TUBAL LIGATION     UPPER GI ENDOSCOPY  2000?    Family History  Problem Relation Age of Onset   Heart failure Mother    Gout Mother    Arthritis Mother    Hypertension Mother    Stroke Mother    Heart failure Father    Diabetes Father    Hyperlipidemia Father    COPD Sister    Cancer Maternal Aunt        breast   Cancer Maternal Uncle        kidney   Breast cancer Neg Hx     Social History:  reports that she quit smoking about 27 years ago. Her  smoking use included cigarettes. She has a 40.00 pack-year smoking history. She has never used smokeless tobacco. She reports that she does not drink alcohol and does not use drugs. She denies current alcohol or tobacco use. She quit smoking remotely. She denies exposure to radiation or toxins. She retired at age 28. 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 The patient is alone today.  Allergies:  Allergies  Allergen Reactions   Ampicillin Swelling   Penicillins Anaphylaxis and Swelling    IgE = 10 (11/05/2017)  Has patient had a PCN reaction causing immediate rash, facial/tongue/throat swelling, SOB or lightheadedness with hypotension: Yes Has patient had a PCN reaction causing severe rash involving mucus membranes or skin necrosis: No Has patient had a PCN reaction that required hospitalization: No Has patient had a PCN reaction occurring within the last 10 years: Yes If all of the above answers are "NO", then may proceed with Cephalosporin use.   Vancomycin     Other reaction(s): Other (see comments)   Vibramycin [Doxycycline Calcium] Rash    Current Medications: Current Outpatient Medications  Medication Sig Dispense Refill   albuterol (VENTOLIN HFA) 108 (90 Base) MCG/ACT inhaler INHALE 2 PUFFS INTO THE LUNGS EVERY 4 HOURS AS NEEDED FOR WHEEZING 18 g 0   allopurinol (ZYLOPRIM) 100 MG tablet Take 200 mg by mouth every morning.      Apremilast (OTEZLA PO) Take by mouth.     aspirin EC 81 MG tablet Take 81 mg by mouth daily. Swallow whole.     betamethasone dipropionate 0.05 % cream APPLY TO PSORIASIS ON HANDS TWICE DAILY ONLY. AVOID FACE, GROIN AND AXILLA (Patient taking differently: as needed.) 45 g 0   betamethasone valerate (VALISONE) 0.1 % cream Apply topically 2 (two) times daily as needed (Rash). 45 g 3   calcipotriene (DOVONOX) 0.005 % cream Apply to psoriasis areas on legs and feet daily once or twice a day 60 g 0   Cholecalciferol (VITAMIN D3) 2000 units TABS Take 2,000 Units by  mouth daily.     diclofenac sodium (VOLTAREN) 1 % GEL Apply 2 g topically 3 (three) times daily.      ferrous sulfate 325 (65 FE) MG tablet Take 325 mg by mouth daily with breakfast.      gabapentin (NEURONTIN) 600 MG tablet Take 1 tablet (600 mg total) by mouth at bedtime. 30 tablet 5   hydrALAZINE (APRESOLINE) 50 MG tablet TAKE 1 TABLET(50 MG) BY MOUTH TWICE DAILY 180 tablet 1   HYDROcodone-acetaminophen (NORCO) 7.5-325 MG tablet Take 1 tablet by mouth every 8 (eight) hours as needed for severe  pain. Must last 30 days. 90 tablet 0   ketoconazole (NIZORAL) 2 % cream daily as needed.      levothyroxine (SYNTHROID) 175 MCG tablet Take 1 tablet (175 mcg total) by mouth daily. 90 tablet 1   losartan (COZAAR) 25 MG tablet Take 1 tablet (25 mg total) by mouth every morning. 90 tablet 1   meclizine (ANTIVERT) 12.5 MG tablet TAKE 1 TABLET(12.5 MG) BY MOUTH THREE TIMES DAILY AS NEEDED FOR DIZZINESS 30 tablet 0   methotrexate (RHEUMATREX) 2.5 MG tablet Take 20 mg by mouth every Friday. Take 5 tablets by mouth once weekly     montelukast (SINGULAIR) 10 MG tablet Take 1 tablet (10 mg total) by mouth at bedtime. 90 tablet 1   Multiple Vitamins-Calcium (ONE-A-DAY WOMENS FORMULA PO) Take 1 tablet by mouth daily.     mupirocin ointment (BACTROBAN) 2 % Apply 1 application topically 2 (two) times daily. 22 g 0   omega-3 acid ethyl esters (LOVAZA) 1 g capsule Take 1 capsule (1 g total) by mouth 2 (two) times daily. 180 capsule 1   omeprazole (PRILOSEC) 40 MG capsule Take 1 capsule (40 mg total) by mouth daily. 90 capsule 1   Polyethyl Glycol-Propyl Glycol (SYSTANE OP) Place 1 drop into both eyes daily as needed (dry eyes).      potassium chloride SA (KLOR-CON) 20 MEQ tablet Take 0.5 tablets (10 mEq total) by mouth daily. 45 tablet 1   tiZANidine (ZANAFLEX) 4 MG tablet Take 1 tablet (4 mg total) by mouth at bedtime. 60 tablet 2   triamcinolone (KENALOG) 0.1 % Apply 1 application topically 2 (two) times daily. 30 g 0    triamterene-hydrochlorothiazide (MAXZIDE) 75-50 MG tablet Take 1 tablet by mouth daily. 90 tablet 1   [START ON 09/25/2020] HYDROcodone-acetaminophen (NORCO) 7.5-325 MG tablet Take 1 tablet by mouth every 8 (eight) hours as needed for severe pain. Must last 30 days. (Patient not taking: Reported on 08/30/2020) 90 tablet 0   [START ON 10/25/2020] HYDROcodone-acetaminophen (NORCO) 7.5-325 MG tablet Take 1 tablet by mouth every 8 (eight) hours as needed for severe pain. Must last 30 days. 90 tablet 0   nitrofurantoin, macrocrystal-monohydrate, (MACROBID) 100 MG capsule Take 1 capsule (100 mg total) by mouth 2 (two) times daily for 5 days. 10 capsule 0   No current facility-administered medications for this visit.    Review of Systems  Constitutional:  Negative for chills, diaphoresis, fever, malaise/fatigue and weight loss (up 7 lbs).  HENT:  Negative for congestion, ear discharge, ear pain, hearing loss, nosebleeds, sinus pain, sore throat and tinnitus.   Eyes:        Glaucoma  Respiratory:  Positive for shortness of breath (on exertion, due to asthma). Negative for cough, hemoptysis and sputum production.   Cardiovascular:  Positive for leg swelling (right ankle). Negative for chest pain and palpitations.  Gastrointestinal:  Negative for abdominal pain, blood in stool, constipation, diarrhea, heartburn, melena, nausea and vomiting.  Genitourinary:  Negative for dysuria, frequency, hematuria and urgency.  Musculoskeletal:  Positive for joint pain (arthritis). Negative for back pain, myalgias and neck pain.  Skin:  Positive for rash. Negative for itching.  Neurological:  Positive for sensory change (foot numbness s/p surgery). Negative for dizziness, tingling, weakness and headaches.  Endo/Heme/Allergies:  Does not bruise/bleed easily.  Psychiatric/Behavioral:  Negative for depression and memory loss. The patient is not nervous/anxious and does not have insomnia.   All other systems reviewed and are  negative.  Performance status (ECOG):  1-2  Vitals Blood pressure 134/62, pulse 66, temperature (!) 97.2 F (36.2 C), resp. rate 18, weight 277 lb 3.7 oz (125.8 kg).   Physical Exam Vitals and nursing note reviewed.  Constitutional:      General: She is not in acute distress.    Appearance: She is not diaphoretic.  Eyes:     General: No scleral icterus.    Conjunctiva/sclera: Conjunctivae normal.  Neurological:     Mental Status: She is alert and oriented to person, place, and time.  Psychiatric:        Behavior: Behavior normal.        Thought Content: Thought content normal.        Judgment: Judgment normal.   Office Visit on 08/30/2020  Component Date Value Ref Range Status   Color, UA 08/30/2020 yellow   Final   Clarity, UA 08/30/2020 clear   Final   Glucose, UA 08/30/2020 Negative  Negative Final   Bilirubin, UA 08/30/2020 neg   Final   Ketones, UA 08/30/2020 neg   Final   Spec Grav, UA 08/30/2020 1.015  1.010 - 1.025 Final   Blood, UA 08/30/2020 neg   Final   pH, UA 08/30/2020 6.5  5.0 - 8.0 Final   Protein, UA 08/30/2020 Negative  Negative Final   Urobilinogen, UA 08/30/2020 0.2  0.2 or 1.0 E.U./dL Final   Nitrite, UA 08/30/2020 neg   Final   Leukocytes, UA 08/30/2020 Moderate (2+) (A) Negative Final   Appearance 08/30/2020 yellow,clear   Final   Odor 08/30/2020 none   Final  Orders Only on 08/30/2020  Component Date Value Ref Range Status   Retic Ct Pct 08/30/2020 1.8  0.4 - 3.1 % Final   RBC. 08/30/2020 3.28 (A) 3.87 - 5.11 MIL/uL Final   Retic Count, Absolute 08/30/2020 58.1  19.0 - 186.0 K/uL Final   Immature Retic Fract 08/30/2020 28.7 (A) 2.3 - 15.9 % Final   Reticulocyte Hemoglobin 08/30/2020 35.1  >27.9 pg Final   Comment:        Given the high negative predictive value of a RET-He result > 32 pg iron deficiency is essentially excluded. If this patient is anemic other etiologies should be considered. Performed at [facility], [address] [place], [place] [address]   Appointment on 08/30/2020  Component Date Value Ref Range Status   Sed Rate 08/30/2020 45 (A) 0 - 30 mm/hr Final   Performed at [facility], [address], [place], [place] [address]   Iron 08/30/2020 67  28 - 170 ug/dL Final   TIBC 08/30/2020 276  250 - 450 ug/dL Final   Saturation Ratios 08/30/2020 24  10.4 - 31.8 % Final   UIBC 08/30/2020 209  ug/dL Final   Performed at [facility], [address] [place], [place] [address]   Ferritin 08/30/2020 106  11 - 307 ng/mL Final   Performed at [facility], [address] [place],  [address]   WBC 08/30/2020 4.5  4.0 - 10.5 K/uL Final   RBC 08/30/2020 3.36 (A) 3.87 - 5.11 MIL/uL Final   Hemoglobin 08/30/2020 10.3 (A) 12.0 - 15.0 g/dL Final   HCT 08/30/2020 31.1 (A) 36.0 - 46.0 % Final   MCV 08/30/2020 92.6  80.0 - 100.0 fL Final   MCH 08/30/2020 30.7  26.0 - 34.0 pg Final   MCHC 08/30/2020 33.1  30.0 - 36.0 g/dL Final   RDW 08/30/2020 14.4  11.5 - 15.5 % Final  Platelets 08/30/2020 169  150 - 400 K/uL Final   nRBC 08/30/2020 0.0  0.0 - 0.2 % Final   Neutrophils Relative % 08/30/2020 64  % Final   Neutro Abs 08/30/2020 2.9  1.7 - 7.7 K/uL Final   Lymphocytes Relative 08/30/2020 23  % Final   Lymphs Abs 08/30/2020 1.0  0.7 - 4.0 K/uL Final   Monocytes Relative 08/30/2020 9  % Final   Monocytes Absolute 08/30/2020 0.4  0.1 - 1.0 K/uL Final   Eosinophils Relative 08/30/2020 3  % Final   Eosinophils Absolute 08/30/2020 0.1  0.0 - 0.5 K/uL Final   Basophils Relative 08/30/2020 1  % Final   Basophils Absolute 08/30/2020 0.0  0.0 - 0.1 K/uL Final   Immature Granulocytes 08/30/2020 0  % Final   Abs Immature Granulocytes 08/30/2020 0.02  0.00 - 0.07 K/uL Final   Performed at [facility], [address], [place], [place] [address]    Assessment and Plan.  # Anemia in chronic kidney disease.  Labs are reviewed and discussed with patient. Hemoglobin  is slightly decreased at 10.3, comparing his baseline.  Iron panel was available after her visit.  Recommend IV Venofer weekly x 2 to increase iron store.  # Elevated folic acid  6/38/7564 folic acid was > 332. Continue folic acid to 3 days a week.   5.   RTC in 4 months for MD assessment, labs (CBC with diff, iron, tibc ferritin, sed rate), and +/-Retacrit.  I discussed the assessment and treatment plan with the patient.  The patient was provided an opportunity to ask questions and all were answered.  The patient agreed with the plan and demonstrated an understanding of the instructions.  The patient was advised to call back if the symptoms worsen or if the condition fails to improve as anticipated.  We spent sufficient time to discuss many aspect of care, questions were answered to patient's satisfaction. A total of 30 minutes was spent on this visit.  With 10 minutes spent reviewing previous medical records, laboratory findings. 15 minutes counseling the patient on the diagnosis, plan of treatment  Additional 5 minutes was spent on answering patient's questions.   Earlie Server, MD, PhD Hematology Oncology [place] at [facility] 08/30/2020

## 2020-08-30 NOTE — Progress Notes (Signed)
Pt here for follow up. No new concerns voiced.   

## 2020-08-31 ENCOUNTER — Telehealth: Payer: Self-pay

## 2020-08-31 NOTE — Telephone Encounter (Signed)
08/31/2020 Spoke w/ pt and informed her of weekly venofer appts for 7/15 and 7/22 at 1:30, along with 4 month f/u labs on 11/8 and MD on 11/10; all @ [facility]. Pt confirmed all appts  SRW

## 2020-08-31 NOTE — Telephone Encounter (Signed)
-----   Message from Earlie Server, MD sent at 08/30/2020  9:56 PM EDT ----- Please arrange her to do iv venofer  weekly x 2.  Follow up in 4 months, labs prior. MD + venofer or retacrit.

## 2020-08-31 NOTE — Telephone Encounter (Signed)
Please schedule pt as requested by MD and notify pt of appt. Pt was seen in [place] yesterday. Please check with pt if she wants txs in [place].

## 2020-09-01 ENCOUNTER — Encounter: Payer: Self-pay | Admitting: Dermatology

## 2020-09-05 ENCOUNTER — Ambulatory Visit: Payer: Medicare Other | Admitting: Dermatology

## 2020-09-07 ENCOUNTER — Other Ambulatory Visit: Payer: Self-pay

## 2020-09-07 ENCOUNTER — Other Ambulatory Visit: Payer: Self-pay | Admitting: Oncology

## 2020-09-07 ENCOUNTER — Inpatient Hospital Stay: Payer: Medicare Other

## 2020-09-07 VITALS — BP 149/73 | HR 76 | Temp 97.0°F | Resp 20

## 2020-09-07 DIAGNOSIS — I10 Essential (primary) hypertension: Secondary | ICD-10-CM | POA: Diagnosis not present

## 2020-09-07 DIAGNOSIS — D631 Anemia in chronic kidney disease: Secondary | ICD-10-CM | POA: Diagnosis not present

## 2020-09-07 DIAGNOSIS — N183 Chronic kidney disease, stage 3 unspecified: Secondary | ICD-10-CM

## 2020-09-07 DIAGNOSIS — M3500 Sicca syndrome, unspecified: Secondary | ICD-10-CM | POA: Diagnosis not present

## 2020-09-07 DIAGNOSIS — D649 Anemia, unspecified: Secondary | ICD-10-CM

## 2020-09-07 DIAGNOSIS — R809 Proteinuria, unspecified: Secondary | ICD-10-CM | POA: Diagnosis not present

## 2020-09-07 DIAGNOSIS — N1832 Chronic kidney disease, stage 3b: Secondary | ICD-10-CM | POA: Diagnosis not present

## 2020-09-07 DIAGNOSIS — M069 Rheumatoid arthritis, unspecified: Secondary | ICD-10-CM | POA: Diagnosis not present

## 2020-09-07 MED ORDER — IRON SUCROSE 20 MG/ML IV SOLN
200.0000 mg | Freq: Once | INTRAVENOUS | Status: AC
Start: 2020-09-07 — End: 2020-09-07
  Administered 2020-09-07: 200 mg via INTRAVENOUS

## 2020-09-07 MED ORDER — SODIUM CHLORIDE 0.9 % IV SOLN
Freq: Once | INTRAVENOUS | Status: AC
Start: 2020-09-07 — End: 2020-09-07
  Filled 2020-09-07: qty 250

## 2020-09-07 MED ORDER — SODIUM CHLORIDE 0.9 % IV SOLN: 200.0000 mg | Freq: Once | INTRAVENOUS | Status: DC

## 2020-09-14 ENCOUNTER — Inpatient Hospital Stay: Payer: Medicare Other

## 2020-09-14 ENCOUNTER — Other Ambulatory Visit: Payer: Self-pay

## 2020-09-14 VITALS — BP 124/75 | HR 62 | Temp 98.0°F | Resp 20

## 2020-09-14 DIAGNOSIS — N1832 Chronic kidney disease, stage 3b: Secondary | ICD-10-CM | POA: Diagnosis not present

## 2020-09-14 DIAGNOSIS — D631 Anemia in chronic kidney disease: Secondary | ICD-10-CM | POA: Diagnosis not present

## 2020-09-14 DIAGNOSIS — D649 Anemia, unspecified: Secondary | ICD-10-CM

## 2020-09-14 DIAGNOSIS — N183 Chronic kidney disease, stage 3 unspecified: Secondary | ICD-10-CM

## 2020-09-14 DIAGNOSIS — R809 Proteinuria, unspecified: Secondary | ICD-10-CM | POA: Diagnosis not present

## 2020-09-14 DIAGNOSIS — I10 Essential (primary) hypertension: Secondary | ICD-10-CM | POA: Diagnosis not present

## 2020-09-14 DIAGNOSIS — M069 Rheumatoid arthritis, unspecified: Secondary | ICD-10-CM | POA: Diagnosis not present

## 2020-09-14 DIAGNOSIS — M3500 Sicca syndrome, unspecified: Secondary | ICD-10-CM | POA: Diagnosis not present

## 2020-09-14 MED ORDER — SODIUM CHLORIDE 0.9 % IV SOLN
Freq: Once | INTRAVENOUS | Status: AC
  Filled 2020-09-14: qty 250

## 2020-09-14 MED ORDER — IRON SUCROSE 20 MG/ML IV SOLN
200.0000 mg | Freq: Once | INTRAVENOUS | Status: AC
  Administered 2020-09-14: 200 mg via INTRAVENOUS

## 2020-09-14 MED ORDER — SODIUM CHLORIDE 0.9 % IV SOLN: 200.0000 mg | Freq: Once | INTRAVENOUS | Status: DC

## 2020-09-17 ENCOUNTER — Ambulatory Visit (HOSPITAL_BASED_OUTPATIENT_CLINIC_OR_DEPARTMENT_OTHER): Payer: Medicare Other | Admitting: Student in an Organized Health Care Education/Training Program

## 2020-09-17 ENCOUNTER — Encounter: Payer: Self-pay | Admitting: Student in an Organized Health Care Education/Training Program

## 2020-09-17 ENCOUNTER — Ambulatory Visit
Admission: RE | Admit: 2020-09-17 | Discharge: 2020-09-17 | Disposition: A | Discharge status: Home / Self Care | Payer: Medicare Other | Source: Ambulatory Visit | Attending: Student in an Organized Health Care Education/Training Program | Admitting: Student in an Organized Health Care Education/Training Program

## 2020-09-17 ENCOUNTER — Other Ambulatory Visit: Payer: Self-pay

## 2020-09-17 VITALS — BP 128/73 | HR 72 | Temp 97.0°F | Resp 14 | Ht 71.0 in | Wt 277.0 lb

## 2020-09-17 DIAGNOSIS — M47892 Other spondylosis, cervical region: Secondary | ICD-10-CM | POA: Diagnosis not present

## 2020-09-17 DIAGNOSIS — G894 Chronic pain syndrome: Secondary | ICD-10-CM | POA: Insufficient documentation

## 2020-09-17 DIAGNOSIS — M19011 Primary osteoarthritis, right shoulder: Secondary | ICD-10-CM

## 2020-09-17 DIAGNOSIS — M47812 Spondylosis without myelopathy or radiculopathy, cervical region: Secondary | ICD-10-CM | POA: Diagnosis not present

## 2020-09-17 MED ORDER — MIDAZOLAM HCL 5 MG/5ML IJ SOLN
INTRAMUSCULAR | Status: AC
  Filled 2020-09-17: qty 5

## 2020-09-17 MED ORDER — IOHEXOL 180 MG/ML  SOLN
10.0000 mL | Freq: Once | INTRAMUSCULAR | Status: AC
  Administered 2020-09-17: 10 mL via INTRA_ARTICULAR

## 2020-09-17 MED ORDER — MIDAZOLAM HCL 5 MG/5ML IJ SOLN
1.0000 mg | INTRAMUSCULAR | Status: DC | PRN
  Administered 2020-09-17: 1 mg via INTRAVENOUS
  Administered 2020-09-17: 0.5 mg via INTRAVENOUS

## 2020-09-17 MED ORDER — ROPIVACAINE HCL 2 MG/ML IJ SOLN
9.0000 mL | Freq: Once | INTRAMUSCULAR | Status: AC
  Administered 2020-09-17: 9 mL via PERINEURAL

## 2020-09-17 MED ORDER — DEXAMETHASONE SODIUM PHOSPHATE 10 MG/ML IJ SOLN
10.0000 mg | Freq: Once | INTRAMUSCULAR | Status: AC
  Administered 2020-09-17: 10 mg

## 2020-09-17 MED ORDER — LIDOCAINE HCL 2 % IJ SOLN
20.0000 mL | Freq: Once | INTRAMUSCULAR | Status: AC
  Administered 2020-09-17: 200 mg

## 2020-09-17 MED ORDER — DEXAMETHASONE SODIUM PHOSPHATE 10 MG/ML IJ SOLN
INTRAMUSCULAR | Status: AC
  Filled 2020-09-17: qty 2

## 2020-09-17 MED ORDER — METHYLPREDNISOLONE ACETATE 40 MG/ML IJ SUSP
INTRAMUSCULAR | Status: AC
  Filled 2020-09-17: qty 1

## 2020-09-17 MED ORDER — LIDOCAINE HCL 2 % IJ SOLN
INTRAMUSCULAR | Status: AC
  Filled 2020-09-17: qty 10

## 2020-09-17 MED ORDER — ROPIVACAINE HCL 2 MG/ML IJ SOLN
INTRAMUSCULAR | Status: AC
  Filled 2020-09-17: qty 20

## 2020-09-17 MED ORDER — FENTANYL CITRATE (PF) 100 MCG/2ML IJ SOLN
INTRAMUSCULAR | Status: AC
  Filled 2020-09-17: qty 2

## 2020-09-17 MED ORDER — METHYLPREDNISOLONE ACETATE 40 MG/ML IJ SUSP
40.0000 mg | Freq: Once | INTRAMUSCULAR | Status: AC
  Administered 2020-09-17: 40 mg via INTRA_ARTICULAR

## 2020-09-17 NOTE — Progress Notes (Signed)
Safety precautions to be maintained throughout the outpatient stay will include: orient to surroundings, keep bed in low position, maintain call bell within reach at all times, provide assistance with transfer out of bed and ambulation.  

## 2020-09-17 NOTE — Progress Notes (Signed)
Patient's Name: Michele Moore  MRN: [id number]  Referring Provider: Juline Patch, MD  DOB: [birth year]  PCP: Juline Patch, MD  DOS: 09/17/2020  Note by: Gillis Santa, MD  Service setting: Ambulatory outpatient  Specialty: Interventional Pain Management  Patient type: Established  Location: [facility] (AMB) Pain Management Facility  Visit type: Interventional Procedure   Primary Reason for Visit: Interventional Pain Management Treatment. CC: Neck Pain, Hip Pain, and Shoulder Pain  Procedure:          Anesthesia, Analgesia, Anxiolysis:  Type: Cervical Facet Medial Branch Block(s)           Primary Purpose: Diagnostic Region: Posterolateral cervical spine Level: C4, C5, C6, Medial Branch Level(s). Injecting these levels blocks the  C4-5, C5-6, cervical facet joints. Laterality: Bilateral  Type:  Minimal sedation Indication(s):  anxiety Route: Intravenous (IV) IV Access: Secured Sedation: Meaningful verbal contact was maintained at all times during the procedure  Local Anesthetic: Lidocaine 1-2%  Position: Prone with head of the table raised to facilitate breathing.   Indications: 1. Cervical facet syndrome   2. Primary osteoarthritis of right shoulder   3. Chronic pain syndrome     Pain Score: Pre-procedure: 8 /10 Post-procedure: 5 /10  Pre-op Assessment:  Michele Moore is a 70 y.o. (year old), female patient, seen today for interventional treatment. She  has a past surgical history that includes Carpal tunnel release; Rotator cuff repair; Ankle surgery; Tubal ligation; Colonoscopy (2000?); Upper gi endoscopy (2000?); Tonsillectomy; Fusion of talonavicular joint (Right, 08/03/2015); Colonoscopy with propofol (N/A, 10/22/2017); Esophagogastroduodenoscopy (egd) with propofol (N/A, 10/22/2017); polypectomy (N/A, 10/22/2017); Total knee revision (Right, 01/20/2018); and Joint replacement (Right). Michele Moore has a current medication list which includes the following prescription(s): albuterol, allopurinol,  apremilast, aspirin ec, betamethasone dipropionate, betamethasone valerate, calcipotriene, vitamin d3, diclofenac sodium, ferrous sulfate, gabapentin, hydralazine, hydrocodone-acetaminophen, ketoconazole, levothyroxine, losartan, meclizine, methotrexate, montelukast, multiple vitamins-calcium, mupirocin ointment, omega-3 acid ethyl esters, omeprazole, polyethyl glycol-propyl glycol, potassium chloride sa, tizanidine, triamcinolone cream, triamterene-hydrochlorothiazide, [START ON 09/25/2020] hydrocodone-acetaminophen, [START ON 10/25/2020] hydrocodone-acetaminophen, [DISCONTINUED] fluticasone, and [DISCONTINUED] mometasone, and the following Facility-Administered Medications: midazolam. Her primarily concern today is the Neck Pain, Hip Pain, and Shoulder Pain  Initial Vital Signs:  Pulse/HCG Rate: 81ECG Heart Rate: 67 Temp: (!) 83 F (28.3 C) Resp: 18 BP: 131/75 SpO2: 100 %  BMI: Estimated body mass index is 38.63 kg/m as calculated from the following:   Height as of this encounter: 5\' 11"  (1.803 m).   Weight as of this encounter: 277 lb (125.6 kg).  Risk Assessment: Allergies: Reviewed. She is allergic to ampicillin, penicillins, vancomycin, and vibramycin [doxycycline calcium].  Allergy Precautions: None required Coagulopathies: Reviewed. None identified.  Blood-thinner therapy: None at this time Active Infection(s): Reviewed. None identified. [name] is afebrile  Site Confirmation: Michele Moore was asked to confirm the procedure and laterality before marking the site Procedure checklist: Completed Consent: Before the procedure and under the influence of no sedative(s), amnesic(s), or anxiolytics, the patient was informed of the treatment options, risks and possible complications. To fulfill our ethical and legal obligations, as recommended by the American Medical Association's Code of Ethics, I have informed the patient of my clinical impression; the nature and purpose of the treatment or  procedure; the risks, benefits, and possible complications of the intervention; the alternatives, including doing nothing; the risk(s) and benefit(s) of the alternative treatment(s) or procedure(s); and the risk(s) and benefit(s) of doing nothing. The patient was provided information about the general risks and possible  complications associated with the procedure. These may include, but are not limited to: failure to achieve desired goals, infection, bleeding, organ or nerve damage, allergic reactions, paralysis, and death. In addition, the patient was informed of those risks and complications associated to Spine-related procedures, such as failure to decrease pain; infection (i.e.: Meningitis, epidural or intraspinal abscess); bleeding (i.e.: epidural hematoma, subarachnoid hemorrhage, or any other type of intraspinal or peri-dural bleeding); organ or nerve damage (i.e.: Any type of peripheral nerve, nerve root, or spinal cord injury) with subsequent damage to sensory, motor, and/or autonomic systems, resulting in permanent pain, numbness, and/or weakness of one or several areas of the body; allergic reactions; (i.e.: anaphylactic reaction); and/or death. Furthermore, the patient was informed of those risks and complications associated with the medications. These include, but are not limited to: allergic reactions (i.e.: anaphylactic or anaphylactoid reaction(s)); adrenal axis suppression; blood sugar elevation that in diabetics may result in ketoacidosis or comma; water retention that in patients with history of congestive heart failure may result in shortness of breath, pulmonary edema, and decompensation with resultant heart failure; weight gain; swelling or edema; medication-induced neural toxicity; particulate matter embolism and blood vessel occlusion with resultant organ, and/or nervous system infarction; and/or aseptic necrosis of one or more joints. Finally, the patient was informed that Medicine is  not an exact science; therefore, there is also the possibility of unforeseen or unpredictable risks and/or possible complications that may result in a catastrophic outcome. The patient indicated having understood very clearly. We have given the patient no guarantees and we have made no promises. Enough time was given to the patient to ask questions, all of which were answered to the patient's satisfaction. Ms. Valadez has indicated that she wanted to continue with the procedure. Attestation: I, the ordering provider, attest that I have discussed with the patient the benefits, risks, side-effects, alternatives, likelihood of achieving goals, and potential problems during recovery for the procedure that I have provided informed consent. Date  Time: 09/17/2020  8:09 AM  Pre-Procedure Preparation:  Monitoring: As per clinic protocol. Respiration, ETCO2, SpO2, BP, heart rate and rhythm monitor placed and checked for adequate function Safety Precautions: Patient was assessed for positional comfort and pressure points before starting the procedure. Time-out: I initiated and conducted the "Time-out" before starting the procedure, as per protocol. The patient was asked to participate by confirming the accuracy of the "Time Out" information. Verification of the correct person, site, and procedure were performed and confirmed by me, the nursing staff, and the patient. "Time-out" conducted as per Joint Commission's Universal Protocol (UP.01.01.01). Time: 41  Description of Procedure:          Laterality: Bilateral. The procedure was performed in identical fashion on both sides. Level:  C4, C5, C6,Medial Branch Level(s). Area Prepped: Posterior Cervico-thoracic Region Prepping solution: ChloraPrep (2% chlorhexidine gluconate and 70% isopropyl alcohol) Safety Precautions: Aspiration looking for blood return was conducted prior to all injections. At no point did we inject any substances, as a needle was being  advanced. Before injecting, the patient was told to immediately notify me if she was experiencing any new onset of "ringing in the ears, or metallic taste in the mouth". No attempts were made at seeking any paresthesias. Safe injection practices and needle disposal techniques used. Medications properly checked for expiration dates. SDV (single dose vial) medications used. After the completion of the procedure, all disposable equipment used was discarded in the proper designated medical waste containers. Local Anesthesia: Protocol guidelines were followed.  The patient was positioned over the fluoroscopy table. The area was prepped in the usual manner. The time-out was completed. The target area was identified using fluoroscopy. A 12-in long, straight, sterile hemostat was used with fluoroscopic guidance to locate the targets for each level blocked. Once located, the skin was marked with an approved surgical skin marker. Once all sites were marked, the skin (epidermis, dermis, and hypodermis), as well as deeper tissues (fat, connective tissue and muscle) were infiltrated with a small amount of a short-acting local anesthetic, loaded on a 10cc syringe with a 25G, 1.5-in  Needle. An appropriate amount of time was allowed for local anesthetics to take effect before proceeding to the next step. Local Anesthetic: Lidocaine 2.0% The unused portion of the local anesthetic was discarded in the proper designated containers. Technical explanation of process:   C4 Medial Branch Nerve Block (MBB): The target area for the C4 dorsal medial articular branch is the lateral concave waist of the articular pillar of C4. Under fluoroscopic guidance, a Quincke needle was inserted until contact was made with os over the postero-lateral aspect of the articular pillar of C4 (target area). After negative aspiration for blood,26mL of the nerve block solution was injected without difficulty or complication. The needle was removed  intact. C5 Medial Branch Nerve Block (MBB): The target area for the C5 dorsal medial articular branch is the lateral concave waist of the articular pillar of C5. Under fluoroscopic guidance, a Quincke needle was inserted until contact was made with os over the postero-lateral aspect of the articular pillar of C5 (target area). After negative aspiration for blood, 1 mL of the nerve block solution was injected without difficulty or complication. The needle was removed intact. C6 Medial Branch Nerve Block (MBB): The target area for the C6 dorsal medial articular branch is the lateral concave waist of the articular pillar of C6. Under fluoroscopic guidance, a Quincke needle was inserted until contact was made with os over the postero-lateral aspect of the articular pillar of C6 (target area). After negative aspiration for blood, 1 mL of the nerve block solution was injected without difficulty or complication. The needle was removed intact. Procedural Needles: 22-gauge, 3.5-inch, Quincke needles used for all levels. Nerve block solution: 12cc solution made of 10 cc of 0.2% ropivacaine, 2 cc of Decadron 10 mg/cc.  2 cc injected at each level above bilaterally.    Once the entire procedure was completed, the treated area was cleaned, making sure to leave some of the prepping solution back to take advantage of its long term bactericidal properties.  Vitals:   09/17/20 0905 09/17/20 0910 09/17/20 0917 09/17/20 0927  BP: 131/84 136/86 129/68 128/73  Pulse: 70 72    Resp: 17 15 17 14   Temp:   (!) 97 F (36.1 C)   SpO2: 96% 96% 99% 97%  Weight:      Height:        Start Time: 0851 hrs. End Time: 0909 hrs.  Imaging Guidance (Spinal):          Type of Imaging Technique: Fluoroscopy Guidance (Spinal) Indication(s): Assistance in needle guidance and placement for procedures requiring needle placement in or near specific anatomical locations not easily accessible without such assistance. Exposure Time:  Please see nurses notes. Contrast: None used. Fluoroscopic Guidance: I was personally present during the use of fluoroscopy. "Tunnel Vision Technique" used to obtain the best possible view of the target area. Parallax error corrected before commencing the procedure. "Direction-depth-direction" technique used to  introduce the needle under continuous pulsed fluoroscopy. Once target was reached, antero-posterior, oblique, and lateral fluoroscopic projection used confirm needle placement in all planes. Images permanently stored in EMR. Interpretation: No contrast injected. I personally interpreted the imaging intraoperatively. Adequate needle placement confirmed in multiple planes. Permanent images saved into the patient's record.   Post-operative Assessment:  Post-procedure Vital Signs:  Pulse/HCG Rate: 7267 Temp: (!) 97 F (36.1 C) Resp: 14 BP: 128/73 SpO2: 97 %  EBL: None  Complications: No immediate post-treatment complications observed by team, or reported by patient.  Note: The patient tolerated the entire procedure well. A repeat set of vitals were taken after the procedure and the patient was kept under observation following institutional policy, for this type of procedure. Post-procedural neurological assessment was performed, showing return to baseline, prior to discharge. The patient was provided with post-procedure discharge instructions, including a section on how to identify potential problems. Should any problems arise concerning this procedure, the patient was given instructions to immediately contact us, at any time, without hesitation. In any case, we plan to contact the patient by telephone for a follow-up status report regarding this interventional procedure.  Comments:  No additional relevant information.   Plan of Care   Imaging Orders  DG PAIN CLINIC C-ARM 1-60 MIN NO REPORT     Medications ordered for procedure: Meds ordered this encounter  Medications    lidocaine (XYLOCAINE) 2 % (with pres) injection 400 mg   midazolam (VERSED) 5 MG/5ML injection 1-2 mg    Make sure Flumazenil is available in the pyxis when using this medication. If oversedation occurs, administer 0.2 mg IV over 15 sec. If after 45 sec no response, administer 0.2 mg again over 1 min; may repeat at 1 min intervals; not to exceed 4 doses (1 mg)   ropivacaine (PF) 2 mg/mL (0.2%) (NAROPIN) injection 9 mL   ropivacaine (PF) 2 mg/mL (0.2%) (NAROPIN) injection 9 mL   dexamethasone (DECADRON) injection 10 mg   dexamethasone (DECADRON) injection 10 mg   iohexol (OMNIPAQUE) 180 MG/ML injection 10 mL    Must be Myelogram-compatible. If not available, you may substitute with a water-soluble, non-ionic, hypoallergenic, myelogram-compatible radiological contrast medium.   methylPREDNISolone acetate (DEPO-MEDROL) injection 40 mg    Medications administered: We administered lidocaine, midazolam, ropivacaine (PF) 2 mg/mL (0.2%), ropivacaine (PF) 2 mg/mL (0.2%), dexamethasone, dexamethasone, iohexol, and methylPREDNISolone acetate.  See the medical record for exact dosing, route, and time of administration.  Disposition: Discharge home  Discharge Date & Time: 09/17/2020;   hrs.   Physician-requested Follow-up: Return in about 3 weeks (around 10/08/2020) for Post Procedure Evaluation, virtual.  Future Appointments  Date Time Provider [place]  09/27/2020  3:15 PM Ralene Bathe, MD [facility] None  10/08/2020  4:00 PM Gillis Santa, MD [facility] None  11/15/2020 10:00 AM Gillis Santa, MD [facility] None  01/01/2021  2:00 PM [facility] None  01/03/2021  1:45 PM Earlie Server, MD [facility] None  01/03/2021  2:00 PM [facility] INFUSION CHAIR 2 [facility] None  03/20/2021  2:40 PM [place] ADVISOR [facility]   Primary Care Physician: Juline Patch, MD Location: [facility] Note by: Gillis Santa, MD Date: 09/17/2020; Time: 9:39 AM  Disclaimer:   Medicine is not an exact science. The only guarantee in medicine is that nothing is guaranteed. It is important to note that the decision to proceed with this intervention was based on the information collected from the patient. The Data and  conclusions were drawn from the patient's questionnaire, the interview, and the physical examination. Because the information was provided in large part by the patient, it cannot be guaranteed that it has not been purposely or unconsciously manipulated. Every effort has been made to obtain as much relevant data as possible for this evaluation. It is important to note that the conclusions that lead to this procedure are derived in large part from the available data. Always take into account that the treatment will also be dependent on availability of resources and existing treatment guidelines, considered by other Pain Management Practitioners as being common knowledge and practice, at the time of the intervention. For Medico-Legal purposes, it is also important to point out that variation in procedural techniques and pharmacological choices are the acceptable norm. The indications, contraindications, technique, and results of the above procedure should only be interpreted and judged by a Board-Certified Interventional Pain Specialist with extensive familiarity and expertise in the same exact procedure and technique.

## 2020-09-17 NOTE — Patient Instructions (Signed)

## 2020-09-17 NOTE — Progress Notes (Signed)
PROVIDER NOTE: Information contained herein reflects review and annotations entered in association with encounter. Interpretation of such information and data should be left to medically-trained personnel. Information provided to patient can be located elsewhere in the medical record under "Patient Instructions". Document created using STT-dictation technology, any transcriptional errors that may result from process are unintentional.    Patient: Michele Moore  Service Category: Procedure  Provider: Gillis Santa, MD  DOB: 1950-08-28  DOS: 09/17/2020  Location: [facility]  MRN: [id number]  Setting: Ambulatory - outpatient  Referring Provider: Juline Patch, MD  Type: Established Patient  Specialty: Interventional Pain Management  PCP: Juline Patch, MD   Primary Reason for Visit: Interventional Pain Management Treatment. CC: Neck Pain, Hip Pain, and Shoulder Pain  Procedure:          Anesthesia, Analgesia, Anxiolysis:  Type: Therapeutic Glenohumeral Joint (shoulder) Injection          Primary Purpose: Therapeutic Region: Posterolateral Shoulder Area Level:  Shoulder Target Area: Glenohumeral Joint (shoulder) Approach: Posterior approach. Laterality: Right-Sided  Anxiolysis with IV Versed Local Anesthetic: Lidocaine 1-2%  Position: Supine   Indications: Right shoulder osteoarthritis   Pain Score: Pre-procedure: 8 /10 Post-procedure: 5 /10   Pre-op H&P Assessment:  Michele Moore is a 70 y.o. (year old), female patient, seen today for interventional treatment. She  has a past surgical history that includes Carpal tunnel release; Rotator cuff repair; Ankle surgery; Tubal ligation; Colonoscopy (2000?); Upper gi endoscopy (2000?); Tonsillectomy; Fusion of talonavicular joint (Right, 08/03/2015); Colonoscopy with propofol (N/A, 10/22/2017); Esophagogastroduodenoscopy (egd) with propofol (N/A, 10/22/2017); polypectomy (N/A, 10/22/2017); Total knee revision (Right, 01/20/2018); and  Joint replacement (Right). Michele Moore has a current medication list which includes the following prescription(s): albuterol, allopurinol, apremilast, aspirin ec, betamethasone dipropionate, betamethasone valerate, calcipotriene, vitamin d3, diclofenac sodium, ferrous sulfate, gabapentin, hydralazine, hydrocodone-acetaminophen, ketoconazole, levothyroxine, losartan, meclizine, methotrexate, montelukast, multiple vitamins-calcium, mupirocin ointment, omega-3 acid ethyl esters, omeprazole, polyethyl glycol-propyl glycol, potassium chloride sa, tizanidine, triamcinolone cream, triamterene-hydrochlorothiazide, [START ON 09/25/2020] hydrocodone-acetaminophen, [START ON 10/25/2020] hydrocodone-acetaminophen, [DISCONTINUED] fluticasone, and [DISCONTINUED] mometasone, and the following Facility-Administered Medications: midazolam. Her primarily concern today is the Neck Pain, Hip Pain, and Shoulder Pain  Initial Vital Signs:  Pulse/HCG Rate: 81ECG Heart Rate: 67 Temp: (!) 83 F (28.3 C) Resp: 18 BP: 131/75 SpO2: 100 %  BMI: Estimated body mass index is 38.63 kg/m as calculated from the following:   Height as of this encounter: 5\' 11"  (1.803 m).   Weight as of this encounter: 277 lb (125.6 kg).  Risk Assessment: Allergies: Reviewed. She is allergic to ampicillin, penicillins, vancomycin, and vibramycin [doxycycline calcium].  Allergy Precautions: None required Coagulopathies: Reviewed. None identified.  Blood-thinner therapy: None at this time Active Infection(s): Reviewed. None identified. Michele Moore is afebrile  Site Confirmation: Michele Moore was asked to confirm the procedure and laterality before marking the site Procedure checklist: Completed Consent: Before the procedure and under the influence of no sedative(s), amnesic(s), or anxiolytics, the patient was informed of the treatment options, risks and possible complications. To fulfill our ethical and legal obligations, as recommended by the American Medical  Association's Code of Ethics, I have informed the patient of my clinical impression; the nature and purpose of the treatment or procedure; the risks, benefits, and possible complications of the intervention; the alternatives, including doing nothing; the risk(s) and benefit(s) of the alternative treatment(s) or procedure(s); and the risk(s) and benefit(s) of doing nothing. The patient was provided information about the general risks and  possible complications associated with the procedure. These may include, but are not limited to: failure to achieve desired goals, infection, bleeding, organ or nerve damage, allergic reactions, paralysis, and death. In addition, the patient was informed of those risks and complications associated to the procedure, such as failure to decrease pain; infection; bleeding; organ or nerve damage with subsequent damage to sensory, motor, and/or autonomic systems, resulting in permanent pain, numbness, and/or weakness of one or several areas of the body; allergic reactions; (i.e.: anaphylactic reaction); and/or death. Furthermore, the patient was informed of those risks and complications associated with the medications. These include, but are not limited to: allergic reactions (i.e.: anaphylactic or anaphylactoid reaction(s)); adrenal axis suppression; blood sugar elevation that in diabetics may result in ketoacidosis or comma; water retention that in patients with history of congestive heart failure may result in shortness of breath, pulmonary edema, and decompensation with resultant heart failure; weight gain; swelling or edema; medication-induced neural toxicity; particulate matter embolism and blood vessel occlusion with resultant organ, and/or nervous system infarction; and/or aseptic necrosis of one or more joints. Finally, the patient was informed that Medicine is not an exact science; therefore, there is also the possibility of unforeseen or unpredictable risks and/or possible  complications that may result in a catastrophic outcome. The patient indicated having understood very clearly. We have given the patient no guarantees and we have made no promises. Enough time was given to the patient to ask questions, all of which were answered to the patient's satisfaction. Michele Moore has indicated that she wanted to continue with the procedure. Attestation: I, the ordering provider, attest that I have discussed with the patient the benefits, risks, side-effects, alternatives, likelihood of achieving goals, and potential problems during recovery for the procedure that I have provided informed consent. Date  Time: 09/17/2020  8:09 AM  Pre-Procedure Preparation:  Monitoring: As per clinic protocol. Respiration, ETCO2, SpO2, BP, heart rate and rhythm monitor placed and checked for adequate function Safety Precautions: Patient was assessed for positional comfort and pressure points before starting the procedure. Time-out: I initiated and conducted the "Time-out" before starting the procedure, as per protocol. The patient was asked to participate by confirming the accuracy of the "Time Out" information. Verification of the correct person, site, and procedure were performed and confirmed by me, the nursing staff, and the patient. "Time-out" conducted as per Joint Commission's Universal Protocol (UP.01.01.01). Time: 0850  Description of Procedure:          Area Prepped: Entire shoulder Area DuraPrep (Iodine Povacrylex [0.7% available iodine] and Isopropyl Alcohol, 74% w/w) Safety Precautions: Aspiration looking for blood return was conducted prior to all injections. At no point did we inject any substances, as a needle was being advanced. No attempts were made at seeking any paresthesias. Safe injection practices and needle disposal techniques used. Medications properly checked for expiration dates. SDV (single dose vial) medications used. Description of the Procedure: Protocol guidelines  were followed. The patient was placed in position over the procedure table. The target area was identified and the area prepped in the usual manner. Skin & deeper tissues infiltrated with local anesthetic. Appropriate amount of time allowed to pass for local anesthetics to take effect. The procedure needles were then advanced to the target area. Proper needle placement secured. Negative aspiration confirmed. Solution injected in intermittent fashion, asking for systemic symptoms every 0.5cc of injectate. The needles were then removed and the area cleansed, making sure to leave some of the prepping solution back to  take advantage of its long term bactericidal properties.         Vitals:   09/17/20 0900 09/17/20 0905 09/17/20 0910 09/17/20 0917  BP: (!) 166/85 131/84 136/86 129/68  Pulse: 74 70 72   Resp: 16 17 15 17   Temp:    (!) 97 F (36.1 C)  SpO2: 97% 96% 96% 99%  Weight:      Height:        Start Time: 0851 hrs. End Time: 0909 hrs. Materials:  Needle(s) Type: Spinal Needle Gauge: 22G Length: 3.5-in Medication(s): Please see orders for medications and dosing details. 5 cc solution made of 4 cc of 0.2% ropivacaine, 1 cc of methylprednisolone, 40 mg/cc. Imaging Guidance (Non-Spinal):          Type of Imaging Technique: Fluoroscopy Guidance (Non-Spinal) Indication(s): Assistance in needle guidance and placement for procedures requiring needle placement in or near specific anatomical locations not easily accessible without such assistance. Exposure Time: Please see nurses notes. Contrast: Before injecting any contrast, we confirmed that the patient did not have an allergy to iodine, shellfish, or radiological contrast. Once satisfactory needle placement was completed at the desired level, radiological contrast was injected. Contrast injected under live fluoroscopy. No contrast complications. See chart for type and volume of contrast used. Fluoroscopic Guidance: I was personally present  during the use of fluoroscopy. "Tunnel Vision Technique" used to obtain the best possible view of the target area. Parallax error corrected before commencing the procedure. "Direction-depth-direction" technique used to introduce the needle under continuous pulsed fluoroscopy. Once target was reached, antero-posterior, oblique, and lateral fluoroscopic projection used confirm needle placement in all planes. Images permanently stored in EMR. Interpretation: I personally interpreted the imaging intraoperatively. Adequate needle placement confirmed in multiple planes. Appropriate spread of contrast into desired area was observed. No evidence of afferent or efferent intravascular uptake. Permanent images saved into the patient's record.     Post-operative Assessment:  Post-procedure Vital Signs:  Pulse/HCG Rate: 7267 Temp: (!) 97 F (36.1 C) Resp: 17 BP: 129/68 SpO2: 99 %  EBL: None  Complications: No immediate post-treatment complications observed by team, or reported by patient.  Note: The patient tolerated the entire procedure well. A repeat set of vitals were taken after the procedure and the patient was kept under observation following institutional policy, for this type of procedure. Post-procedural neurological assessment was performed, showing return to baseline, prior to discharge. The patient was provided with post-procedure discharge instructions, including a section on how to identify potential problems. Should any problems arise concerning this procedure, the patient was given instructions to immediately contact us, at any time, without hesitation. In any case, we plan to contact the patient by telephone for a follow-up status report regarding this interventional procedure.  Comments:  No additional relevant information.  Plan of Care  Orders:  Orders Placed This Encounter  Procedures   DG PAIN CLINIC C-ARM 1-60 MIN NO REPORT    Intraoperative interpretation by procedural physician  at [place].    Standing Status:   Standing    Number of Occurrences:   1    Order Specific Question:   Reason for exam:    Answer:   Assistance in needle guidance and placement for procedures requiring needle placement in or near specific anatomical locations not easily accessible without such assistance.   Chronic Opioid Analgesic:  Norco 7.5 mg TID PRN #90/month, 22.5    Medications ordered for procedure: Meds ordered this encounter  Medications  lidocaine (XYLOCAINE) 2 % (with pres) injection 400 mg   midazolam (VERSED) 5 MG/5ML injection 1-2 mg    Make sure Flumazenil is available in the pyxis when using this medication. If oversedation occurs, administer 0.2 mg IV over 15 sec. If after 45 sec no response, administer 0.2 mg again over 1 min; may repeat at 1 min intervals; not to exceed 4 doses (1 mg)   ropivacaine (PF) 2 mg/mL (0.2%) (NAROPIN) injection 9 mL   ropivacaine (PF) 2 mg/mL (0.2%) (NAROPIN) injection 9 mL   dexamethasone (DECADRON) injection 10 mg   dexamethasone (DECADRON) injection 10 mg   iohexol (OMNIPAQUE) 180 MG/ML injection 10 mL    Must be Myelogram-compatible. If not available, you may substitute with a water-soluble, non-ionic, hypoallergenic, myelogram-compatible radiological contrast medium.   methylPREDNISolone acetate (DEPO-MEDROL) injection 40 mg   Medications administered: We administered lidocaine, midazolam, ropivacaine (PF) 2 mg/mL (0.2%), ropivacaine (PF) 2 mg/mL (0.2%), dexamethasone, dexamethasone, iohexol, and methylPREDNISolone acetate.  See the medical record for exact dosing, route, and time of administration.  Follow-up plan:    Recent Visits Date Type Provider Dept  08/23/20 Office Visit Gillis Santa, MD [facility]  Showing recent visits within past 90 days and meeting all other requirements Today's Visits Date Type Provider Dept  09/17/20 Procedure visit Gillis Santa, MD [facility]  Showing today's  visits and meeting all other requirements Future Appointments Date Type Provider Dept  11/15/20 Appointment Gillis Santa, MD [facility]  Showing future appointments within next 90 days and meeting all other requirements Disposition: Discharge home  Discharge (Date  Time): 09/17/2020;   hrs.   Primary Care Physician: Juline Patch, MD Location: [facility] Note by: Gillis Santa, MD Date: 09/17/2020; Time: 9:20 AM  Disclaimer:  Medicine is not an exact science. The only guarantee in medicine is that nothing is guaranteed. It is important to note that the decision to proceed with this intervention was based on the information collected from the patient. The Data and conclusions were drawn from the patient's questionnaire, the interview, and the physical examination. Because the information was provided in large part by the patient, it cannot be guaranteed that it has not been purposely or unconsciously manipulated. Every effort has been made to obtain as much relevant data as possible for this evaluation. It is important to note that the conclusions that lead to this procedure are derived in large part from the available data. Always take into account that the treatment will also be dependent on availability of resources and existing treatment guidelines, considered by other Pain Management Practitioners as being common knowledge and practice, at the time of the intervention. For Medico-Legal purposes, it is also important to point out that variation in procedural techniques and pharmacological choices are the acceptable norm. The indications, contraindications, technique, and results of the above procedure should only be interpreted and judged by a Board-Certified Interventional Pain Specialist with extensive familiarity and expertise in the same exact procedure and technique.

## 2020-09-18 ENCOUNTER — Telehealth: Payer: Self-pay

## 2020-09-18 NOTE — Telephone Encounter (Signed)
PoSt procedure phone call.  Patient sates she is doing good.

## 2020-09-27 ENCOUNTER — Ambulatory Visit: Payer: Medicare Other | Admitting: Dermatology

## 2020-09-28 DIAGNOSIS — L400 Psoriasis vulgaris: Secondary | ICD-10-CM | POA: Diagnosis not present

## 2020-09-28 DIAGNOSIS — Z79899 Other long term (current) drug therapy: Secondary | ICD-10-CM | POA: Diagnosis not present

## 2020-09-28 DIAGNOSIS — Z7689 Persons encountering health services in other specified circumstances: Secondary | ICD-10-CM | POA: Diagnosis not present

## 2020-10-03 ENCOUNTER — Other Ambulatory Visit: Payer: Self-pay | Admitting: Family Medicine

## 2020-10-03 DIAGNOSIS — E039 Hypothyroidism, unspecified: Secondary | ICD-10-CM

## 2020-10-03 DIAGNOSIS — Z20822 Contact with and (suspected) exposure to covid-19: Secondary | ICD-10-CM | POA: Diagnosis not present

## 2020-10-03 NOTE — Telephone Encounter (Signed)
Requested Prescriptions  Pending Prescriptions Disp Refills  . levothyroxine (SYNTHROID) 175 MCG tablet [Pharmacy Med Name: LEVOTHYROXINE 0.175MG  (175MCG) TABS] 90 tablet 1    Sig: TAKE 1 TABLET BY MOUTH EVERY DAY     Endocrinology:  Hypothyroid Agents Failed - 10/03/2020  9:09 PM      Failed - TSH needs to be rechecked within 3 months after an abnormal result. Refill until TSH is due.      Passed - TSH in normal range and within 360 days    TSH  Date Value Ref Range Status  06/12/2020 1.280 0.450 - 4.500 uIU/mL Final         Passed - Valid encounter within last 12 months    Recent Outpatient Visits          1 month ago [place], MD   3 months ago Hypertension, unspecified type   [facility] Juline Patch, MD   6 months ago Viral upper respiratory tract infection   [facility] Juline Patch, MD   10 months ago Hypertension, unspecified type   [facility] Juline Patch, MD   1 year ago Labyrinthitis of both ears   [facility] Juline Patch, MD      Future Appointments            In 5 days Gillis Santa, MD [place]

## 2020-10-04 ENCOUNTER — Encounter: Payer: Self-pay | Admitting: Student in an Organized Health Care Education/Training Program

## 2020-10-08 ENCOUNTER — Other Ambulatory Visit: Payer: Self-pay

## 2020-10-08 ENCOUNTER — Ambulatory Visit
Payer: Medicare Other | Attending: Student in an Organized Health Care Education/Training Program | Admitting: Student in an Organized Health Care Education/Training Program

## 2020-10-08 DIAGNOSIS — M19011 Primary osteoarthritis, right shoulder: Secondary | ICD-10-CM | POA: Diagnosis not present

## 2020-10-08 DIAGNOSIS — M47812 Spondylosis without myelopathy or radiculopathy, cervical region: Secondary | ICD-10-CM | POA: Diagnosis not present

## 2020-10-08 DIAGNOSIS — G894 Chronic pain syndrome: Secondary | ICD-10-CM | POA: Diagnosis not present

## 2020-10-08 NOTE — Progress Notes (Signed)
Patient: Michele Moore  Service Category: E/M  Provider: Gillis Santa, MD  DOB: 06/27/1950  DOS: 10/08/2020  Location: Office  MRN: [id number]  Setting: Ambulatory outpatient  Referring Provider: Juline Patch, MD  Type: Established Patient  Specialty: Interventional Pain Management  PCP: Juline Patch, MD  Location: Home  Delivery: TeleHealth     Virtual Encounter - Pain Management PROVIDER NOTE: Information contained herein reflects review and annotations entered in association with encounter. Interpretation of such information and data should be left to medically-trained personnel. Information provided to patient can be located elsewhere in the medical record under "Patient Instructions". Document created using STT-dictation technology, any transcriptional errors that may result from process are unintentional.    Contact & Pharmacy Preferred: [place]: [phone] (home) Mobile: [phone] (mobile) E-mail: No e-mail address on record  [facility], [place] [address] [address] Phone: [phone] Fax: [phone]   Pre-screening  Michele Moore offered "in-person" vs "virtual" encounter. She indicated preferring virtual for this encounter.   Reason COVID-19*  Social distancing based on CDC and AMA recommendations.   I contacted Michele Moore on 10/08/2020 via video conference.      I clearly identified myself as Gillis Santa, MD. I verified that I was speaking with the correct person using two identifiers (Name: Michele Moore, and date of birth: [birth year]).  Consent I sought verbal advanced consent from Michele Moore for virtual visit interactions. I informed Michele Moore of possible security and privacy concerns, risks, and limitations associated with providing "not-in-person" medical evaluation and management services. I also informed Michele Moore of the availability of "in-person" appointments.  Finally, I informed her that there would be a charge for the virtual visit and that she could be  personally, fully or partially, financially responsible for it. Michele Moore expressed understanding and agreed to proceed.   Historic Elements   Michele Moore is a 70 y.o. year old, female patient evaluated today after our last contact on 09/17/2020. Michele Moore  has a past medical history of Allergy, Anemia, Arthritis, Asthma, Cataract, Chronic kidney disease, Collagen vascular disease ([place]), Complication of anesthesia, Dyspnea, Dysrhythmia, Family history of adverse reaction to anesthesia, GERD (gastroesophageal reflux disease), Glaucoma, H/O tooth extraction, Hemorrhoid, History of hiatal hernia, History of kidney stones, Hypertension, Hypothyroidism, Migraines, Mixed hyperlipidemia, Multiple gastric ulcers, Thyroid disease, and Wears dentures. She also  has a past surgical history that includes Carpal tunnel release; Rotator cuff repair; Ankle surgery; Tubal ligation; Colonoscopy (2000?); Upper gi endoscopy (2000?); Tonsillectomy; Fusion of talonavicular joint (Right, 08/03/2015); Colonoscopy with propofol (N/A, 10/22/2017); Esophagogastroduodenoscopy (egd) with propofol (N/A, 10/22/2017); polypectomy (N/A, 10/22/2017); Total knee revision (Right, 01/20/2018); and Joint replacement (Right). Michele Moore has a current medication list which includes the following prescription(s): albuterol, allopurinol, apremilast, aspirin ec, betamethasone dipropionate, betamethasone valerate, calcipotriene, vitamin d3, diclofenac sodium, ferrous sulfate, gabapentin, hydralazine, ketoconazole, levothyroxine, losartan, meclizine, methotrexate, montelukast, multiple vitamins-calcium, mupirocin ointment, omega-3 acid ethyl esters, omeprazole, polyethyl glycol-propyl glycol, potassium chloride sa, tizanidine, triamcinolone cream, triamterene-hydrochlorothiazide, hydrocodone-acetaminophen, hydrocodone-acetaminophen, [START ON 10/25/2020]  hydrocodone-acetaminophen, [DISCONTINUED] fluticasone, and [DISCONTINUED] mometasone. She  reports that she quit smoking about 27 years ago. Her smoking use included cigarettes. She has a 40.00 pack-year smoking history. She has never used smokeless tobacco. She reports that she does not drink alcohol and does not use drugs. Michele Moore is allergic to ampicillin, penicillins, vancomycin, and  vibramycin [doxycycline calcium].   HPI  Today, she is being contacted for a post-procedure assessment.   Post-Procedure Evaluation  Procedure (09/17/2020):   Type: Therapeutic Glenohumeral Joint (shoulder) Injection          Primary Purpose: Therapeutic Region: Posterolateral Shoulder Area Level:  Shoulder Target Area: Glenohumeral Joint (shoulder) Approach: Posterior approach. Laterality: Right-Sided  Type: Cervical Facet Medial Branch Block(s)           Primary Purpose: Diagnostic Region: Posterolateral cervical spine Level: C4, C5, C6, Medial Branch Level(s). Injecting these levels blocks the  C4-5, C5-6, cervical facet joints. Laterality: Bilateral  Anxiolysis: Please see nurses note.  Effectiveness during initial hour after procedure (Ultra-Short Term Relief): 100 %   Local anesthetic used: Long-acting (4-6 hours) Effectiveness: Defined as any analgesic benefit obtained secondary to the administration of local anesthetics. This carries significant diagnostic value as to the etiological location, or anatomical origin, of the pain. Duration of benefit is expected to coincide with the duration of the local anesthetic used.  Effectiveness during initial 4-6 hours after procedure (Short-Term Relief): 100 %   Long-term benefit: Defined as any relief past the pharmacologic duration of the local anesthetics.  Effectiveness past the initial 6 hours after procedure (Long-Term Relief): 80 % (current)   Benefits, current: Defined as benefit present at the time of this evaluation.   Analgesia:  90-100%  better Function: Michele Moore reports improvement in function ROM: Somewhat improved   Pharmacotherapy Assessment   Analgesic: Norco 7.5 mg TID PRN #90/month, 22.5    Monitoring: [place] PMP: PDMP reviewed during this encounter.       Pharmacotherapy: No side-effects or adverse reactions reported. Compliance: No problems identified. Effectiveness: Clinically acceptable. Plan: Refer to "POC". UDS:  Summary  Date Value Ref Range Status  06/14/2020 Note  Final    Comment:    ==================================================================== [employer] Select 13 (MW) ==================================================================== Test                             Result       Flag       Units  Drug Present and Declared for Prescription Verification   Hydrocodone                    1019         EXPECTED   ng/mg creat   Hydromorphone                  166          EXPECTED   ng/mg creat   Dihydrocodeine                 71           EXPECTED   ng/mg creat   Norhydrocodone                 536          EXPECTED   ng/mg creat    Sources of hydrocodone include scheduled prescription medications.    Hydromorphone, dihydrocodeine and norhydrocodone are expected    metabolites of hydrocodone. Hydromorphone and dihydrocodeine are    also available as scheduled prescription medications.  ==================================================================== Test                      Result    Flag   Units      Ref Range   Creatinine  166              mg/dL      >=20 ==================================================================== Declared Medications:  The flagging and interpretation on this report are based on the  following declared medications.  Unexpected results may arise from  inaccuracies in the declared medications.   **Note: The testing scope of this panel includes these medications:   Hydrocodone (Norco)   **Note: The testing scope of this panel does not include  the  following reported medications:   Acetaminophen (Norco)  Albuterol (Ventolin HFA)  Allopurinol (Zyloprim)  Aspirin  Betamethasone  Fluticasone (Flonase)  Gabapentin (Neurontin)  Hydralazine (Apresoline)  Hydrochlorothiazide (Maxzide)  Iron  Ketoconazole (Nizoral)  Levothyroxine (Synthroid)  Losartan (Cozaar)  Meclizine (Antivert)  Methotrexate  Metronidazole (Flagyl)  Mometasone (Nasonex)  Montelukast (Singulair)  Multivitamin  Mupirocin (Bactroban)  Omega-3 Fatty Acids  Omeprazole (Prilosec)  Polyethylene Glycol  Potassium (Klor-Con)  Tizanidine (Zanaflex)  Topical Diclofenac (Voltaren)  Triamcinolone (Kenalog)  Triamterene (Maxzide)  Vitamin D3 ==================================================================== For clinical consultation, please call [phone]. ====================================================================      Laboratory Chemistry Profile   Renal Lab Results  Component Value Date   BUN 30 (H) 06/12/2020   CREATININE 1.64 (H) 06/12/2020   BCR 18 06/12/2020   GFRAA 30 (L) 08/28/2019   GFRNONAA 30 (L) 12/07/2019    Hepatic Lab Results  Component Value Date   AST 25 06/12/2020   ALT 16 06/12/2020   ALBUMIN 4.4 06/12/2020   ALKPHOS 98 06/12/2020    Electrolytes Lab Results  Component Value Date   NA 144 06/12/2020   K 4.3 06/12/2020   CL 106 06/12/2020   CALCIUM 9.9 06/12/2020   MG 2.0 10/01/2018   PHOS 4.0 04/12/2019    Bone No results found for: VD25OH, VD125OH2TOT, OZ3664QI3, KV4259DG3, 25OHVITD1, 25OHVITD2, 25OHVITD3, TESTOFREE, TESTOSTERONE  Inflammation (CRP: Acute Phase) (ESR: Chronic Phase) Lab Results  Component Value Date   CRP 0.9 04/23/2020   ESRSEDRATE 45 (H) 08/30/2020         Note: Above Lab results reviewed.   Assessment  The primary encounter diagnosis was Cervical facet syndrome. Diagnoses of Primary osteoarthritis of right shoulder and Chronic pain syndrome were also pertinent to this  visit.  Plan of Care   Significant analgesic and functional response after bilateral cervical facet medial branch nerve blocks as well as right glenohumeral steroid joint injection.  Patient states that her neck pain is considerably less and she is able to move her shoulder and do ADLs much more comfortably.  I am pleased with how she has responded.  She has a medication management visit scheduled for next month.  I instructed her to keep that appointment.  Should she have an increase in her neck or shoulder pain in the interim, she is instructed to contact our clinic to consider repeat injection.  Patient endorsed understanding.   Follow-up plan:   Return for Keep sch. appt.    Recent Visits Date Type Provider Dept  09/17/20 Procedure visit Gillis Santa, MD [facility]  08/23/20 Office Visit Gillis Santa, MD [facility]  Showing recent visits within past 90 days and meeting all other requirements Today's Visits Date Type Provider Dept  10/08/20 Telemedicine Gillis Santa, MD [facility]  Showing today's visits and meeting all other requirements Future Appointments Date Type Provider Dept  11/15/20 Appointment Gillis Santa, MD [facility]  Showing future appointments within next 90 days and meeting all other requirements I discussed the assessment  and treatment plan with the patient. The patient was provided an opportunity to ask questions and all were answered. The patient agreed with the plan and demonstrated an understanding of the instructions.  Patient advised to call back or seek an in-person evaluation if the symptoms or condition worsens.  Duration of encounter: 49mnutes.  Note by: BGillis Santa MD Date: 10/08/2020; Time: 1:22 PM

## 2020-10-27 ENCOUNTER — Other Ambulatory Visit: Payer: Self-pay | Admitting: Family Medicine

## 2020-10-27 DIAGNOSIS — E782 Mixed hyperlipidemia: Secondary | ICD-10-CM

## 2020-10-27 NOTE — Telephone Encounter (Signed)
Requested Prescriptions  Pending Prescriptions Disp Refills  . omega-3 acid ethyl esters (LOVAZA) 1 g capsule [Pharmacy Med Name: OMEGA-3-ACID 1GM CAPSULES (RX)] 180 capsule 1    Sig: TAKE 1 CAPSULE BY MOUTH TWICE DAILY     Endocrinology:  Nutritional Agents Passed - 10/27/2020 11:12 AM      Passed - Valid encounter within last 12 months    Recent Outpatient Visits          1 month ago [facility] Juline Patch, MD   4 months ago Hypertension, unspecified type   [facility] Juline Patch, MD   7 months ago Viral upper respiratory tract infection   [facility] Juline Patch, MD   10 months ago Hypertension, unspecified type   [facility] Juline Patch, MD   1 year ago Labyrinthitis of both ears   [facility] Juline Patch, MD

## 2020-10-31 ENCOUNTER — Telehealth: Payer: Self-pay

## 2020-10-31 ENCOUNTER — Telehealth: Payer: Self-pay | Admitting: Family Medicine

## 2020-10-31 NOTE — Telephone Encounter (Signed)
Copied from [place] [id number]. Topic: General - Other >> Oct 31, 2020  4:07 PM Bayard Beaver wrote: Reason for [id number] is trying to see anobgyn. She states she was sent to [facility] by Dr Ronnald Ramp, but they werent taking new patients. Please call back in regards to this. If she needs a referral.

## 2020-11-01 NOTE — Addendum Note (Signed)
Addended by: Juline Patch on: 11/01/2020 09:34 AM   Modules accepted: Orders

## 2020-11-07 ENCOUNTER — Encounter: Payer: Self-pay | Admitting: Oncology

## 2020-11-14 ENCOUNTER — Encounter: Payer: Self-pay | Admitting: Obstetrics and Gynecology

## 2020-11-14 ENCOUNTER — Ambulatory Visit (INDEPENDENT_AMBULATORY_CARE_PROVIDER_SITE_OTHER): Payer: Medicare Other | Admitting: Obstetrics and Gynecology

## 2020-11-14 ENCOUNTER — Other Ambulatory Visit: Payer: Self-pay

## 2020-11-14 VITALS — BP 174/94 | HR 99 | Ht 71.0 in | Wt 273.0 lb

## 2020-11-14 DIAGNOSIS — N950 Postmenopausal bleeding: Secondary | ICD-10-CM | POA: Diagnosis not present

## 2020-11-14 DIAGNOSIS — I499 Cardiac arrhythmia, unspecified: Secondary | ICD-10-CM

## 2020-11-14 NOTE — Progress Notes (Signed)
Obstetrics & Gynecology Office Visit   Chief Complaint  Patient presents with   Vaginal Discharge    Vaginal dischare blood stained per Dr. Ronnald Ramp; Lower abd pain; diarrhea since Mon; bleeding hemorrhoid; constipation;    The patient is seen in referral at the request of Juline Patch, MD from [facility] for brown vaginal discharge.   History of Present Illness: 70 y.o. G22P0 female who is seen in referral from Juline Patch, MD from from [facility] for brown vaginal discharge.  She has been having pain in her lower abdomen.  She sometimes have brown and sometimes red discharge.  This has been going on for about at three or so months.  She recently has had diarrhea.  So, she is not sure where the blood has come from. She has had lower abdominal pain for about two weeks.  She describes the pain as pressure. She rates the pain 10/10 for the past week.  Alleviating factors: nothing.  Aggravating factors: walking, sitting.  Associated symptoms: chills (but she has this sometimes).  She denies unintended weight loss. Denies new constipation.  Unable to tell if she has early satiety.  Sounds like her appetite varies.  She states that she went through menopause at age 42 years.   Notably, she is a difficult historian for this topic.  She seems to have a large number of ailments and pains.  I believe this makes it difficult for her to determine exact symptoms for 1 issue.  Past Medical History:  Diagnosis Date   Allergy    Anemia    Arthritis    Asthma    Cataract    Chronic kidney disease    STAGE 3 PER DR EASON 08/02/15   Collagen vascular disease ([facility])    Complication of anesthesia    WOKE UP DURING COLONOSCOPY   Dyspnea    Dysrhythmia    IRREGULAR HEART BEAT   Family history of adverse reaction to anesthesia    sister difficult to put to sleep   GERD (gastroesophageal reflux disease)    Glaucoma    H/O tooth extraction    all lower teeth 1/19   Hemorrhoid     History of hiatal hernia    History of kidney stones    H/O   Hypertension    Hypothyroidism    Migraines    Mixed hyperlipidemia    Multiple gastric ulcers    Thyroid disease    Wears dentures    partial upper and lower    Past Surgical History:  Procedure Laterality Date   ANKLE SURGERY     CARPAL TUNNEL RELEASE     x3   COLONOSCOPY  2000?   COLONOSCOPY WITH PROPOFOL N/A 10/22/2017   Procedure: COLONOSCOPY WITH PROPOFOL;  Surgeon: Lucilla Lame, MD;  Location: [place];  Service: Endoscopy;  Laterality: N/A;   ESOPHAGOGASTRODUODENOSCOPY (EGD) WITH PROPOFOL N/A 10/22/2017   Procedure: ESOPHAGOGASTRODUODENOSCOPY (EGD) WITH PROPOFOL;  Surgeon: Lucilla Lame, MD;  Location: [place];  Service: Endoscopy;  Laterality: N/A;   FUSION OF TALONAVICULAR JOINT Right 08/03/2015   Procedure: TAILOR NAVICULAR JOINT FUSION - RIGHT ;  Surgeon: [name], DPM;  Location: [facility];  Service: Podiatry;  Laterality: Right;   JOINT REPLACEMENT Right    knee x 3 ,right x1 and left x2   POLYPECTOMY N/A 10/22/2017   Procedure: POLYPECTOMY;  Surgeon: Lucilla Lame, MD;  Location: [place];  Service: Endoscopy;  Laterality: N/A;  ROTATOR CUFF REPAIR     TONSILLECTOMY     TOTAL KNEE REVISION Right 01/20/2018   Procedure: POLYETHYLENE EXCHANGE;  Surgeon: Dereck Leep, MD;  Location: [facility];  Service: Orthopedics;  Laterality: Right;   TUBAL LIGATION     UPPER GI ENDOSCOPY  2000?    Gynecologic History: No LMP recorded. Patient is postmenopausal.  Obstetric History: G51P0  Family History  Problem Relation Age of Onset   Heart failure Mother    Gout Mother    Arthritis Mother    Hypertension Mother    Stroke Mother    Heart failure Father    Diabetes Father    Hyperlipidemia Father    COPD Sister    Cancer Maternal Aunt        breast   Cancer Maternal Uncle        kidney   Breast cancer Neg Hx     Social History   Socioeconomic History   Marital  status: Divorced    Spouse name: Not on file   Number of children: 4   Years of education: Not on file   Highest education level: 9th grade  Occupational History   Occupation: retired  Tobacco Use   Smoking status: Former    Packs/day: 2.00    Years: 20.00    Pack years: 40.00    Types: Cigarettes    Quit date: 02/24/1993    Years since quitting: 27.7   Smokeless tobacco: Never  Vaping Use   Vaping Use: Never used  Substance and Sexual Activity   Alcohol use: No    Alcohol/week: 0.0 standard drinks   Drug use: No   Sexual activity: Not Currently  Other Topics Concern   Not on file  Social History Narrative   Not on file   Social Determinants of Health   Financial Resource Strain: Low Risk    Difficulty of Paying Living Expenses: Not very hard  Food Insecurity: No Food Insecurity   Worried About Charity fundraiser in the Last Year: Never true   Ran Out of Food in the Last Year: Never true  Transportation Needs: No Transportation Needs   Lack of Transportation (Medical): No   Lack of Transportation (Non-Medical): No  Physical Activity: Inactive   Days of Exercise per Week: 0 days   Minutes of Exercise per Session: 0 min  Stress: No Stress Concern Present   Feeling of Stress : Not at all  Social Connections: Moderately Isolated   Frequency of Communication with Friends and Family: More than three times a week   Frequency of Social Gatherings with Friends and Family: More than three times a week   Attends Religious Services: More than 4 times per year   Active Member of [employer] or Organizations: No   Attends Archivist Meetings: Never   Marital Status: Divorced  Human resources officer Violence: Not At Risk   Fear of Current or Ex-Partner: No   Emotionally Abused: No   Physically Abused: No   Sexually Abused: No    Allergies  Allergen Reactions   Ampicillin Swelling   Penicillins Anaphylaxis and Swelling    IgE = 10 (11/05/2017)  Has patient had a PCN  reaction causing immediate rash, facial/tongue/throat swelling, SOB or lightheadedness with hypotension: Yes Has patient had a PCN reaction causing severe rash involving mucus membranes or skin necrosis: No Has patient had a PCN reaction that required hospitalization: No Has patient had a PCN reaction occurring within the last 10  years: Yes If all of the above answers are "NO", then may proceed with Cephalosporin use.   Vancomycin     Other reaction(s): Other (see comments)   Vibramycin [Doxycycline Calcium] Rash    Prior to Admission medications   Medication Sig Start Date End Date Taking? Authorizing Provider  allopurinol (ZYLOPRIM) 100 MG tablet Take 2 tablets by mouth daily. 11/09/20  Yes [provider]  albuterol (VENTOLIN HFA) 108 (90 Base) MCG/ACT inhaler INHALE 2 PUFFS INTO THE LUNGS EVERY 4 HOURS AS NEEDED FOR WHEEZING 08/13/20   Juline Patch, MD  allopurinol (ZYLOPRIM) 100 MG tablet Take 200 mg by mouth every morning.  08/06/17   [provider]  Apremilast (OTEZLA PO) Take by mouth.    [provider]  aspirin EC 81 MG tablet Take 81 mg by mouth daily. Swallow whole.    [provider]  betamethasone dipropionate 0.05 % cream APPLY TO PSORIASIS ON HANDS TWICE DAILY ONLY. AVOID FACE, [place] AXILLA Patient taking differently: as needed. 05/30/19   Ralene Bathe, MD  betamethasone valerate (VALISONE) 0.1 % cream Apply topically 2 (two) times daily as needed (Rash). 03/15/20   Ralene Bathe, MD  calcipotriene (DOVONOX) 0.005 % cream Apply to psoriasis areas on legs and feet daily once or twice a day 08/23/20   Ralene Bathe, MD  Cholecalciferol (VITAMIN D3) 2000 units TABS Take 2,000 Units by mouth daily.    [provider]  diclofenac sodium (VOLTAREN) 1 % GEL Apply 2 g topically 3 (three) times daily.  08/11/17   [provider]  ferrous sulfate 325 (65 FE) MG tablet Take 325 mg by mouth daily with breakfast.      [provider]  gabapentin (NEURONTIN) 600 MG tablet Take 1 tablet (600 mg total) by mouth at bedtime. 08/23/20 02/19/21  Gillis Santa, MD  hydrALAZINE (APRESOLINE) 50 MG tablet TAKE 1 TABLET(50 MG) BY MOUTH TWICE DAILY 06/12/20   Juline Patch, MD  HYDROcodone-acetaminophen (NORCO) 7.5-325 MG tablet Take 1 tablet by mouth every 8 (eight) hours as needed for severe pain. Must last 30 days. 08/26/20 09/25/20  Gillis Santa, MD  HYDROcodone-acetaminophen (NORCO) 7.5-325 MG tablet Take 1 tablet by mouth every 8 (eight) hours as needed for severe pain. Must last 30 days. Patient not taking: No sig reported 09/25/20 10/25/20  Gillis Santa, MD  HYDROcodone-acetaminophen (NORCO) 7.5-325 MG tablet Take 1 tablet by mouth every 8 (eight) hours as needed for severe pain. Must last 30 days. Patient not taking: No sig reported 10/25/20 11/24/20  Gillis Santa, MD  ketoconazole (NIZORAL) 2 % cream daily as needed.  11/26/18   [provider]  levothyroxine (SYNTHROID) 175 MCG tablet TAKE 1 TABLET BY MOUTH EVERY DAY 10/03/20   Juline Patch, MD  losartan (COZAAR) 25 MG tablet Take 1 tablet (25 mg total) by mouth every morning. 06/12/20   Juline Patch, MD  meclizine (ANTIVERT) 12.5 MG tablet TAKE 1 TABLET(12.5 MG) BY MOUTH THREE TIMES DAILY AS NEEDED FOR DIZZINESS 08/13/20   Juline Patch, MD  methotrexate (RHEUMATREX) 2.5 MG tablet Take 20 mg by mouth every Friday. Take 5 tablets by mouth once weekly 09/29/18   [provider]  montelukast (SINGULAIR) 10 MG tablet Take 1 tablet (10 mg total) by mouth at bedtime. 06/12/20   Juline Patch, MD  Multiple Vitamins-Calcium (ONE-A-DAY WOMENS FORMULA PO) Take 1 tablet by mouth daily.    [provider]  mupirocin ointment Drue Stager)  2 % Apply 1 application topically 2 (two) times daily. 12/08/19   Juline Patch, MD  omega-3 acid ethyl esters (LOVAZA) 1 g capsule TAKE 1 CAPSULE BY MOUTH TWICE DAILY 10/27/20   Juline Patch, MD  omeprazole  (PRILOSEC) 40 MG capsule Take 1 capsule (40 mg total) by mouth daily. 06/12/20   Juline Patch, MD  OTEZLA 30 MG TABS Take 1 tablet by mouth daily as needed. 11/12/20   [provider]  Polyethyl Glycol-Propyl Glycol (SYSTANE OP) Place 1 drop into both eyes daily as needed (dry eyes).     [provider]  potassium chloride SA (KLOR-CON) 20 MEQ tablet Take 0.5 tablets (10 mEq total) by mouth daily. 06/12/20   Juline Patch, MD  tiZANidine (ZANAFLEX) 4 MG tablet Take 1 tablet (4 mg total) by mouth at bedtime. 08/23/20 02/19/21  Gillis Santa, MD  triamcinolone (KENALOG) 0.1 % Apply 1 application topically 2 (two) times daily. 03/22/20   Juline Patch, MD  triamterene-hydrochlorothiazide (MAXZIDE) 75-50 MG tablet Take 1 tablet by mouth daily. 06/12/20   Juline Patch, MD  fluticasone (FLONASE) 50 MCG/ACT nasal spray SHAKE LIQUID AND USE 1 SPRAY IN [facility] NOSTRIL DAILY 02/28/19 04/24/19  Juline Patch, MD  mometasone (NASONEX) 50 MCG/ACT nasal spray Place 2 sprays into the nose daily. 04/12/19 04/24/19  Juline Patch, MD    Review of Systems  Constitutional: Negative.   HENT: Negative.    Eyes: Negative.   Respiratory: Negative.    Cardiovascular: Negative.   Gastrointestinal:  Positive for abdominal pain, blood in stool, diarrhea and melena. Negative for constipation, heartburn, nausea and vomiting.  Genitourinary: Negative.   Musculoskeletal:  Positive for back pain and joint pain. Negative for falls, myalgias and neck pain.  Skin: Negative.   Neurological: Negative.   Psychiatric/Behavioral: Negative.      Physical Exam BP (!) 174/94   Pulse 99   Ht 5\' 11"  (1.803 m)   Wt 273 lb (123.8 kg)   BMI 38.08 kg/m  No LMP recorded. Patient is postmenopausal. Physical Exam Constitutional:      General: She is not in acute distress.    Appearance: Normal appearance. She is well-developed.  Genitourinary:     Vulva and bladder normal.     Right Labia: No rash, tenderness,  lesions, skin changes or Bartholin's cyst.    Left Labia: No tenderness, lesions, skin changes, Bartholin's cyst or rash.    No inguinal adenopathy present in the right or left side.    Pelvic Tanner Score: 5/5.    No vaginal discharge, erythema, tenderness or bleeding.      Right Adnexa: not tender, not full and no mass present.    Left Adnexa: not tender, not full and no mass present.    No cervical motion tenderness, discharge, lesion or polyp.     Uterus is not enlarged or tender.     No uterine mass detected.    Pelvic exam was performed with patient in the lithotomy position.  Breasts:    Right: No inverted nipple, mass, nipple discharge, skin change or tenderness.     Left: No inverted nipple, mass, nipple discharge, skin change or tenderness.  HENT:     Head: Normocephalic and atraumatic.  Eyes:     General: No scleral icterus.    Conjunctiva/sclera: Conjunctivae normal.  Neck:     Thyroid: No thyromegaly.  Cardiovascular:     Rate and Rhythm: Normal rate. Rhythm irregular.  Heart sounds: No murmur heard.   No friction rub. No gallop.     Comments: Irregularly irregular Pulmonary:     Effort: Pulmonary effort is normal. No respiratory distress.     Breath sounds: Normal breath sounds. No wheezing or rales.  Abdominal:     General: Bowel sounds are normal. There is no distension.     Palpations: Abdomen is soft. There is no mass.     Tenderness: There is no abdominal tenderness. There is no guarding or rebound.     Hernia: There is no hernia in the left inguinal area or right inguinal area.  Musculoskeletal:        General: No swelling or tenderness. Normal range of motion.     Cervical back: Normal range of motion and neck supple.  Lymphadenopathy:     Cervical: No cervical adenopathy.     Lower Body: No right inguinal adenopathy. No left inguinal adenopathy.  Neurological:     General: No focal deficit present.     Mental Status: She is alert and oriented to  person, place, and time.     Cranial Nerves: No cranial nerve deficit.  Skin:    General: Skin is warm and dry.     Findings: No erythema or rash.  Psychiatric:        Mood and Affect: Mood normal.        Behavior: Behavior normal.        Judgment: Judgment normal.    Female chaperone present for pelvic and breast  portions of the physical exam  Assessment: 70 y.o. G8P0 female here for  1. Postmenopausal bleeding   2. Cardiac arrhythmia, unspecified cardiac arrhythmia type      Plan: Problem List Items Addressed This Visit   None Visit Diagnoses     Postmenopausal bleeding    -  Primary   Relevant Orders   US PELVIC COMPLETE WITH TRANSVAGINAL   NuSwab Vaginitis Plus (VG+)   Cardiac arrhythmia, unspecified cardiac arrhythmia type          Postmenopausal bleeding: No evidence of bleeding on exam today.  We will order a pelvic ultrasound to better characterize.  Negative wet prep today.  She states that she has not been sexually active for 30 years.  So, STI risk is very low.  Cardiac arrhythmia: I asked her if she had an irregular heartbeat.  She states that she does have a history of an irregular heartbeat.  I will defer this to her primary care provider for further investigation or continued management.  A total of 34 minutes were spent face-to-face with the patient as well as preparation, review, communication, and documentation during this encounter.    [name], MD 11/14/2020 4:46 PM    CC: Juline Patch, MD [address] [place],  [place] [id number]

## 2020-11-15 ENCOUNTER — Ambulatory Visit (HOSPITAL_BASED_OUTPATIENT_CLINIC_OR_DEPARTMENT_OTHER): Payer: Medicare Other | Admitting: Student in an Organized Health Care Education/Training Program

## 2020-11-15 ENCOUNTER — Encounter: Payer: Self-pay | Admitting: Student in an Organized Health Care Education/Training Program

## 2020-11-15 ENCOUNTER — Ambulatory Visit
Admission: RE | Admit: 2020-11-15 | Discharge: 2020-11-15 | Disposition: A | Discharge status: Home / Self Care | Payer: Medicare Other | Source: Ambulatory Visit | Attending: Obstetrics and Gynecology | Admitting: Obstetrics and Gynecology

## 2020-11-15 VITALS — BP 139/95 | HR 86 | Temp 97.3°F | Resp 18 | Ht 71.0 in | Wt 270.0 lb

## 2020-11-15 DIAGNOSIS — G5702 Lesion of sciatic nerve, left lower limb: Secondary | ICD-10-CM | POA: Diagnosis not present

## 2020-11-15 DIAGNOSIS — M47818 Spondylosis without myelopathy or radiculopathy, sacral and sacrococcygeal region: Secondary | ICD-10-CM | POA: Diagnosis not present

## 2020-11-15 DIAGNOSIS — M461 Sacroiliitis, not elsewhere classified: Secondary | ICD-10-CM

## 2020-11-15 DIAGNOSIS — G894 Chronic pain syndrome: Secondary | ICD-10-CM | POA: Insufficient documentation

## 2020-11-15 DIAGNOSIS — N950 Postmenopausal bleeding: Secondary | ICD-10-CM | POA: Insufficient documentation

## 2020-11-15 DIAGNOSIS — M7062 Trochanteric bursitis, left hip: Secondary | ICD-10-CM | POA: Diagnosis not present

## 2020-11-15 DIAGNOSIS — M47816 Spondylosis without myelopathy or radiculopathy, lumbar region: Secondary | ICD-10-CM | POA: Diagnosis not present

## 2020-11-15 MED ORDER — GABAPENTIN 600 MG PO TABS: 600.0000 mg | ORAL_TABLET | Freq: Every day | ORAL | 5 refills | Status: DC

## 2020-11-15 MED ORDER — HYDROCODONE-ACETAMINOPHEN 7.5-325 MG PO TABS: 1.0000 | ORAL_TABLET | Freq: Three times a day (TID) | ORAL | 0 refills | Status: DC | PRN

## 2020-11-15 MED ORDER — TIZANIDINE HCL 4 MG PO TABS: 4.0000 mg | ORAL_TABLET | Freq: Every day | ORAL | 2 refills | Status: DC

## 2020-11-15 NOTE — Progress Notes (Signed)
Nursing Pain Medication Assessment:  Safety precautions to be maintained throughout the outpatient stay will include: orient to surroundings, keep bed in low position, maintain call bell within reach at all times, provide assistance with transfer out of bed and ambulation.  Medication Inspection Compliance: Pill count conducted under aseptic conditions, in front of the patient. Neither the pills nor the bottle was removed from the patient's sight at any time. Once count was completed pills were immediately returned to the patient in their original bottle.  Medication: Hydrocodone/APAP Pill/Patch Count: 18/90 Pill/Patch Appearance: Markings consistent with prescribed medication Bottle Appearance: Standard pharmacy container. Clearly labeled. Filled Date: 56 / 3 / 2022 Last Medication intake:  Today

## 2020-11-15 NOTE — Progress Notes (Signed)
PROVIDER NOTE: Information contained herein reflects review and annotations entered in association with encounter. Interpretation of such information and data should be left to medically-trained personnel. Information provided to patient can be located elsewhere in the medical record under "Patient Instructions". Document created using STT-dictation technology, any transcriptional errors that may result from process are unintentional.    Patient: Michele Moore  Service Category: E/M  Provider: Gillis Santa, MD  DOB: 05-Jun-1950  DOS: 11/15/2020  Specialty: Interventional Pain Management  MRN: [id number]  Setting: Ambulatory outpatient  PCP: Michele Patch, MD  Type: Established Patient    Referring Provider: Juline Patch, MD  Location: Office  Delivery: Face-to-face     HPI  Michele Moore, a 70 y.o. year old female, is here today because of her SI joint arthritis [M47.818]. [name] primary complain today is Back Pain Last encounter: My last encounter with her was on 10/08/20 Pertinent problems: [name] has Chronic pain of left knee; Lumbar radiculopathy; Lumbar degenerative disc disease; Chronic pain syndrome; Spinal stenosis, lumbar region, with neurogenic claudication; Cervical facet syndrome; Primary osteoarthritis of right shoulder; Sprain of anterior talofibular ligament of right ankle; Severe obesity (BMI 35.0-35.9 with comorbidity) ([place]); CKD (chronic kidney disease), stage III ([place]); Primary osteoarthritis of left hip; and Lumbar spondylosis on their pertinent problem list. Pain Assessment: Severity of Chronic pain is reported as a 10-Worst pain ever/10. Location: Back Mid, Lower/radiates to left hip down entire leg to ankle. Onset: More than a month ago. Quality: Shooting, Sharp, Aching, Discomfort, Burning. Timing: Constant. Modifying factor(s): rest,medications. Vitals:  height is 5' 11" (1.803 m) and weight is 270 lb (122.5 kg). Her temperature is 97.3 F (36.3 C) (abnormal). Her blood  pressure is 139/95 (abnormal) and her pulse is 86. Her respiration is 18 and oxygen saturation is 99%.   Reason for encounter:   Ms Michele Moore presents today for medication management as well as worsening left hip, buttock, SI joint and left piriformis pain.  She is having intermittent groin pain when she walks. She is also endorsing bilateral knee pain as well as right ankle pain. She is due for a refill of her chronic analgesic regimen as below.  UDS up-to-date and appropriate.  Pharmacotherapy Assessment  Analgesic: Norco 7.5 mg TID PRN #90/month, 22.5    Monitoring: [place] PMP: PDMP reviewed during this encounter.       Pharmacotherapy: No side-effects or adverse reactions reported. Compliance: No problems identified. Effectiveness: Clinically acceptable.  Michele Specking, RN  11/15/2020  9:29 AM  Sign when Signing Visit Nursing Pain Medication Assessment:  Safety precautions to be maintained throughout the outpatient stay will include: orient to surroundings, keep bed in low position, maintain call bell within reach at all times, provide assistance with transfer out of bed and ambulation.  Medication Inspection Compliance: Pill count conducted under aseptic conditions, in front of the patient. Neither the pills nor the bottle was removed from the patient's sight at any time. Once count was completed pills were immediately returned to the patient in their original bottle.  Medication: Hydrocodone/APAP Pill/Moore Count: 18/90 Pill/Moore Appearance: Markings consistent with prescribed medication Bottle Appearance: Standard pharmacy container. Clearly labeled. Filled Date: 89 / 3 / 2022 Last Medication intake:  Today      UDS:  Summary  Date Value Ref Range Status  06/14/2020 Note  Final    Comment:    ==================================================================== [facility] (MW) ==================================================================== Test  Result       Flag       Units  Drug Present and Declared for Prescription Verification   Hydrocodone                    1019         EXPECTED   ng/mg creat   Hydromorphone                  166          EXPECTED   ng/mg creat   Dihydrocodeine                 71           EXPECTED   ng/mg creat   Norhydrocodone                 536          EXPECTED   ng/mg creat    Sources of hydrocodone include scheduled prescription medications.    Hydromorphone, dihydrocodeine and norhydrocodone are expected    metabolites of hydrocodone. Hydromorphone and dihydrocodeine are    also available as scheduled prescription medications.  ==================================================================== Test                      Result    Flag   Units      Ref Range   Creatinine              166              mg/dL      >=20 ==================================================================== Declared Medications:  The flagging and interpretation on this report are based on the  following declared medications.  Unexpected results may arise from  inaccuracies in the declared medications.   **Note: The testing scope of this panel includes these medications:   Hydrocodone (Norco)   **Note: The testing scope of this panel does not include the  following reported medications:   Acetaminophen (Norco)  Albuterol (Ventolin HFA)  Allopurinol (Zyloprim)  Aspirin  Betamethasone  Fluticasone (Flonase)  Gabapentin (Neurontin)  Hydralazine (Apresoline)  Hydrochlorothiazide (Maxzide)  Iron  Ketoconazole (Nizoral)  Levothyroxine (Synthroid)  Losartan (Cozaar)  Meclizine (Antivert)  Methotrexate  Metronidazole (Flagyl)  Mometasone (Nasonex)  Montelukast (Singulair)  Multivitamin  Mupirocin (Bactroban)  Omega-3 Fatty Acids  Omeprazole (Prilosec)  Polyethylene Glycol  Potassium (Klor-Con)  Tizanidine (Zanaflex)  Topical Diclofenac (Voltaren)  Triamcinolone (Kenalog)  Triamterene (Maxzide)   Vitamin D3 ==================================================================== For clinical consultation, please call [phone]. ====================================================================      ROS  Constitutional: Denies any fever or chills Gastrointestinal: No reported hemesis, hematochezia, vomiting, or acute GI distress Musculoskeletal:  Diffuse arthralgias and myalgias, cervical spine pain, lumbar spine pain, bilateral hip pain left SI joint, left piriformis pain Neurological: No reported episodes of acute onset apraxia, aphasia, dysarthria, agnosia, amnesia, paralysis, loss of coordination, or loss of consciousness  Medication Review  Apremilast, HYDROcodone-acetaminophen, Multiple Vitamins-Calcium, Polyethyl Glycol-Propyl Glycol, Vitamin D3, albuterol, allopurinol, aspirin EC, betamethasone dipropionate, betamethasone valerate, calcipotriene, diclofenac sodium, ferrous sulfate, fluticasone, gabapentin, hydrALAZINE, ketoconazole, levothyroxine, losartan, meclizine, methotrexate, mometasone, montelukast, mupirocin ointment, omega-3 acid ethyl esters, omeprazole, potassium chloride SA, tiZANidine, triamcinolone cream, and triamterene-hydrochlorothiazide  History Review  Allergy: Ms. Hosman is allergic to ampicillin, penicillins, vancomycin, and vibramycin [doxycycline calcium]. Drug: Ms. Alonge  reports no history of drug use. Alcohol:  reports no history of alcohol use. Tobacco:  reports that she quit smoking about 27 years ago. Her smoking  use included cigarettes. She has a 40.00 pack-year smoking history. She has never used smokeless tobacco. Social: Ms. Tamashiro  reports that she quit smoking about 27 years ago. Her smoking use included cigarettes. She has a 40.00 pack-year smoking history. She has never used smokeless tobacco. She reports that she does not drink alcohol and does not use drugs. Medical:  has a past medical history of Allergy, Anemia, Arthritis, Asthma, Cataract,  Chronic kidney disease, Collagen vascular disease ([place]), Complication of anesthesia, Dyspnea, Dysrhythmia, Family history of adverse reaction to anesthesia, GERD (gastroesophageal reflux disease), Glaucoma, H/O tooth extraction, Hemorrhoid, History of hiatal hernia, History of kidney stones, Hypertension, Hypothyroidism, Migraines, Mixed hyperlipidemia, Multiple gastric ulcers, Thyroid disease, and Wears dentures. Surgical: Ms. Skousen  has a past surgical history that includes Carpal tunnel release; Rotator cuff repair; Ankle surgery; Tubal ligation; Colonoscopy (2000?); Upper gi endoscopy (2000?); Tonsillectomy; Fusion of talonavicular joint (Right, 08/03/2015); Colonoscopy with propofol (N/A, 10/22/2017); Esophagogastroduodenoscopy (egd) with propofol (N/A, 10/22/2017); polypectomy (N/A, 10/22/2017); Total knee revision (Right, 01/20/2018); and Joint replacement (Right). Family: family history includes Arthritis in her mother; COPD in her sister; Cancer in her maternal aunt and maternal uncle; Diabetes in her father; Gout in her mother; Heart failure in her father and mother; Hyperlipidemia in her father; Hypertension in her mother; Stroke in her mother.  Laboratory Chemistry Profile   Renal Lab Results  Component Value Date   BUN 30 (H) 06/12/2020   CREATININE 1.64 (H) 06/12/2020   BCR 18 06/12/2020   GFRAA 30 (L) 08/28/2019   GFRNONAA 30 (L) 12/07/2019    Hepatic Lab Results  Component Value Date   AST 25 06/12/2020   ALT 16 06/12/2020   ALBUMIN 4.4 06/12/2020   ALKPHOS 98 06/12/2020    Electrolytes Lab Results  Component Value Date   NA 144 06/12/2020   K 4.3 06/12/2020   CL 106 06/12/2020   CALCIUM 9.9 06/12/2020   MG 2.0 10/01/2018   PHOS 4.0 04/12/2019    Bone No results found for: VD25OH, VD125OH2TOT, HG9924QA8, TM1962IW9, 25OHVITD1, 25OHVITD2, 25OHVITD3, TESTOFREE, TESTOSTERONE  Inflammation (CRP: Acute Phase) (ESR: Chronic Phase) Lab Results  Component Value Date   CRP 0.9  04/23/2020   ESRSEDRATE 45 (H) 08/30/2020          Note: Above Lab results reviewed.  Recent Imaging Review  DG PAIN CLINIC C-ARM 1-60 MIN NO REPORT Fluoro was used, but no Radiologist interpretation will be provided.  Please refer to "NOTES" tab for provider progress note. Note: Reviewed        Physical Exam  General appearance: Well nourished, well developed, and well hydrated. In no apparent acute distress Mental status: Alert, oriented x 3 (person, place, & time)       Respiratory: No evidence of acute respiratory distress Eyes: PERLA Vitals: BP (!) 139/95   Pulse 86   Temp (!) 97.3 F (36.3 C)   Resp 18   Ht 5' 11" (1.803 m)   Wt 270 lb (122.5 kg)   SpO2 99%   BMI 37.66 kg/m  BMI: Estimated body mass index is 37.66 kg/m as calculated from the following:   Height as of this encounter: 5' 11" (1.803 m).   Weight as of this encounter: 270 lb (122.5 kg). Ideal: Ideal body weight: 70.8 kg (156 lb 1.4 oz) Adjusted ideal body weight: 91.5 kg (201 lb 10.4 oz)    Cervical Spine Area Exam  Skin & Axial Inspection: No masses, redness, edema, swelling, or associated skin lesions  Alignment: Symmetrical Functional ROM: Diminished ROM      Stability: No instability detected Muscle Tone/Strength: Functionally intact. No obvious neuro-muscular anomalies detected. Sensory (Neurological): Articular pain pattern and musculoskeletal Palpation: Complains of area being tender to palpation                     Upper Extremity (UE) Exam      Side: Right upper extremity   Side: Left upper extremity   Skin & Extremity Inspection: Edema of right wrist   Skin & Extremity Inspection: Skin color, temperature, and hair growth are WNL. No peripheral edema or cyanosis. No masses, redness, swelling, asymmetry, or associated skin lesions. No contractures.   Functional ROM: Decreased ROM for shoulder and elbow   Functional ROM: Unrestricted ROM           Muscle Tone/Strength: Functionally intact. No  obvious neuro-muscular anomalies detected.   Muscle Tone/Strength: Functionally intact. No obvious neuro-muscular anomalies detected.   Sensory (Neurological): Musculoskeletal pain pattern           Sensory (Neurological): Unimpaired           Palpation: No palpable anomalies               Palpation: No palpable anomalies               Provocative Test(s):  Phalen's test: deferred Tinel's test: deferred Apley's scratch test (touch opposite shoulder):  Action 1 (Across chest): Decreased ROM Action 2 (Overhead): Decreased ROM Action 3 (LB reach): Decreased ROM     Provocative Test(s):  Phalen's test: deferred Tinel's test: deferred Apley's scratch test (touch opposite shoulder):  Action 1 (Across chest): Decreased ROM Action 2 (Overhead): Decreased ROM Action 3 (LB reach): Decreased ROM       Thoracic Spine Area Exam  Skin & Axial Inspection: No masses, redness, or swelling Alignment: Symmetrical Functional ROM: Unrestricted ROM Stability: No instability detected Muscle Tone/Strength: Functionally intact. No obvious neuro-muscular anomalies detected. Sensory (Neurological): Unimpaired Muscle strength & Tone: No palpable anomalies   Lumbar Spine Area Exam  Skin & Axial Inspection: No masses, redness, or swelling Alignment: Symmetrical Functional ROM: Improved after treatment       Stability: No instability detected Muscle Tone/Strength: Functionally intact. No obvious neuro-muscular anomalies detected. Sensory (Neurological): Dermatomal pain pattern and referred SI joint Palpation: No palpable anomalies       Provocative Tests: Hyperextension/rotation test: deferred today       Lumbar quadrant test (Kemp's test): deferred today       Lateral bending test: Improved after treatment       Patrick's Maneuver: Positive for left SI joint arthralgia                FABER test: Positive for left SI joint arthralgia             S-I anterior distraction/compression test: Positive for left  SI joint arthralgia     S-I lateral compression test: Positive for left SI joint arthralgia          S-I Thigh-thrust test: Positive for left SI joint arthralgia       S-I Gaenslen's test: Positive for left SI joint arthralgia            Gait & Posture Assessment  Ambulation: Limited Gait: Antalgic Posture: Difficulty standing up straight, due to pain    Lower Extremity Exam      Side: Right lower extremity   Side: Left lower extremity  Stability:  No instability observed           Stability: No instability observed          Skin & Extremity Inspection: Skin color, temperature, and hair growth are WNL. No peripheral edema or cyanosis. No masses, redness, swelling, asymmetry, or associated skin lesions. No contractures.   Skin & Extremity Inspection: Skin color, temperature, and hair growth are WNL. No peripheral edema or cyanosis. No masses, redness, swelling, asymmetry, or associated skin lesions. No contractures.  Functional ROM: Unrestricted ROM                   Functional ROM: Decreased ROM for hip and knee joints          Muscle Tone/Strength: Functionally intact. No obvious neuro-muscular anomalies detected.   Muscle Tone/Strength: Functionally intact. No obvious neuro-muscular anomalies detected.  Sensory (Neurological): Unimpaired   Sensory (Neurological): Dermatomal pain pattern and arthropathic  Palpation: No palpable anomalies   Palpation: No palpable anomalies    Assessment   Status Diagnosis  Having a Flare-up Having a Flare-up Having a Flare-up 1. SI joint arthritis   2. Piriformis syndrome of left side   3. Trochanteric bursitis of left hip   4. Lumbar spondylosis   5. Chronic pain syndrome      Updated Problems: Problem  Piriformis Syndrome of Left Side    Plan of Care    Michele Moore has a current medication list which includes the following long-term medication(s): albuterol, allopurinol, allopurinol, ferrous sulfate, hydralazine, levothyroxine,  losartan, montelukast, omega-3 acid ethyl esters, omeprazole, potassium chloride sa, triamterene-hydrochlorothiazide, gabapentin, [START ON 11/25/2020] hydrocodone-acetaminophen, [START ON 12/25/2020] hydrocodone-acetaminophen, [START ON 01/24/2021] hydrocodone-acetaminophen, [DISCONTINUED] fluticasone, and [DISCONTINUED] mometasone.  1. SI joint arthritis - SACROILIAC JOINT INJECTION; Future  2. Piriformis syndrome of left side - TRIGGER POINT INJECTION  3. Trochanteric bursitis of left hip  4. Lumbar spondylosis - HYDROcodone-acetaminophen (NORCO) 7.5-325 MG tablet; Take 1 tablet by mouth every 8 (eight) hours as needed for severe pain. Must last 30 days.  Dispense: 90 tablet; Refill: 0 - HYDROcodone-acetaminophen (NORCO) 7.5-325 MG tablet; Take 1 tablet by mouth every 8 (eight) hours as needed for severe pain. Must last 30 days.  Dispense: 90 tablet; Refill: 0 - HYDROcodone-acetaminophen (NORCO) 7.5-325 MG tablet; Take 1 tablet by mouth every 8 (eight) hours as needed for severe pain. Must last 30 days.  Dispense: 90 tablet; Refill: 0 - gabapentin (NEURONTIN) 600 MG tablet; Take 1 tablet (600 mg total) by mouth at bedtime.  Dispense: 30 tablet; Refill: 5 - tiZANidine (ZANAFLEX) 4 MG tablet; Take 1 tablet (4 mg total) by mouth at bedtime.  Dispense: 60 tablet; Refill: 2  5. Chronic pain syndrome - HYDROcodone-acetaminophen (NORCO) 7.5-325 MG tablet; Take 1 tablet by mouth every 8 (eight) hours as needed for severe pain. Must last 30 days.  Dispense: 90 tablet; Refill: 0 - HYDROcodone-acetaminophen (NORCO) 7.5-325 MG tablet; Take 1 tablet by mouth every 8 (eight) hours as needed for severe pain. Must last 30 days.  Dispense: 90 tablet; Refill: 0 - HYDROcodone-acetaminophen (NORCO) 7.5-325 MG tablet; Take 1 tablet by mouth every 8 (eight) hours as needed for severe pain. Must last 30 days.  Dispense: 90 tablet; Refill: 0 - gabapentin (NEURONTIN) 600 MG tablet; Take 1 tablet (600 mg total) by mouth  at bedtime.  Dispense: 30 tablet; Refill: 5 - tiZANidine (ZANAFLEX) 4 MG tablet; Take 1 tablet (4 mg total) by mouth at bedtime.  Dispense: 60 tablet; Refill: 2 -  SACROILIAC JOINT INJECTION; Future - TRIGGER POINT INJECTION    Pharmacotherapy (Medications Ordered): Meds ordered this encounter  Medications   HYDROcodone-acetaminophen (NORCO) 7.5-325 MG tablet    Sig: Take 1 tablet by mouth every 8 (eight) hours as needed for severe pain. Must last 30 days.    Dispense:  90 tablet    Refill:  0    Chronic Pain. (STOP Act - Not applicable). Fill one day early if closed on scheduled refill date.   HYDROcodone-acetaminophen (NORCO) 7.5-325 MG tablet    Sig: Take 1 tablet by mouth every 8 (eight) hours as needed for severe pain. Must last 30 days.    Dispense:  90 tablet    Refill:  0    Chronic Pain. (STOP Act - Not applicable). Fill one day early if closed on scheduled refill date.   HYDROcodone-acetaminophen (NORCO) 7.5-325 MG tablet    Sig: Take 1 tablet by mouth every 8 (eight) hours as needed for severe pain. Must last 30 days.    Dispense:  90 tablet    Refill:  0    Chronic Pain. (STOP Act - Not applicable). Fill one day early if closed on scheduled refill date.   gabapentin (NEURONTIN) 600 MG tablet    Sig: Take 1 tablet (600 mg total) by mouth at bedtime.    Dispense:  30 tablet    Refill:  5   tiZANidine (ZANAFLEX) 4 MG tablet    Sig: Take 1 tablet (4 mg total) by mouth at bedtime.    Dispense:  60 tablet    Refill:  2   Orders:  Orders Placed This Encounter  Procedures   SACROILIAC JOINT INJECTION    Standing Status:   Future    Standing Expiration Date:   12/15/2020    Scheduling Instructions:     Side: LEFT          Timeframe: ASAP    Order Specific Question:   Where will this procedure be performed?    Answer:   [facility]   TRIGGER POINT INJECTION    Area: Buttocks region (gluteal area) Indications: Piriformis muscle pain;  LEFT piriformis-syndrome;  piriformis muscle spasms (G53.646). CPT code: 20552    Scheduling Instructions:     Type: Myoneural block (TPI) of piriformis muscle.     Side:  LEFT     Sedation: Patient's choice.     Timeframe: Today    Order Specific Question:   Where will this procedure be performed?    Answer:   [facility]   Follow-up plan:   Return in about 2 weeks (around 11/29/2020) for Left SI-J + Left Piriformis , without sedation.    Recent Visits Date Type Provider Dept  10/08/20 Telemedicine Michele Santa, MD [facility]  09/17/20 Procedure visit Michele Santa, MD [facility]  08/23/20 Office Visit Michele Santa, MD [facility]  Showing recent visits within past 90 days and meeting all other requirements Today's Visits Date Type Provider Dept  11/15/20 Office Visit Michele Santa, MD [facility]  Showing today's visits and meeting all other requirements Future Appointments Date Type Provider Dept  11/26/20 Appointment Michele Santa, MD [facility]  02/12/21 Appointment Michele Santa, MD [facility]  Showing future appointments within next 90 days and meeting all other requirements I discussed the assessment and treatment plan with the patient. The patient was provided an opportunity to ask questions and all were answered. The patient agreed with  the plan and demonstrated an understanding of the instructions.  Patient advised to call back or seek an in-person evaluation if the symptoms or condition worsens.  Duration of encounter: 42mnutes.  Note by: BGillis Santa MD Date: 11/15/2020; Time: 10:46 AM

## 2020-11-15 NOTE — Patient Instructions (Signed)

## 2020-11-17 LAB — NUSWAB VAGINITIS PLUS (VG+)
Candida albicans, NAA: NEGATIVE
Candida glabrata, NAA: NEGATIVE
Chlamydia trachomatis, NAA: NEGATIVE
Neisseria gonorrhoeae, NAA: NEGATIVE
Trich vag by NAA: NEGATIVE

## 2020-11-19 ENCOUNTER — Ambulatory Visit: Payer: Medicare Other

## 2020-11-21 ENCOUNTER — Ambulatory Visit: Payer: Medicare Other

## 2020-11-21 ENCOUNTER — Encounter: Payer: Self-pay | Admitting: Obstetrics and Gynecology

## 2020-11-21 ENCOUNTER — Ambulatory Visit (INDEPENDENT_AMBULATORY_CARE_PROVIDER_SITE_OTHER): Payer: Medicare Other | Admitting: Obstetrics and Gynecology

## 2020-11-21 ENCOUNTER — Other Ambulatory Visit: Payer: Self-pay

## 2020-11-21 DIAGNOSIS — N950 Postmenopausal bleeding: Secondary | ICD-10-CM

## 2020-11-21 NOTE — Progress Notes (Signed)
Virtual Visit via Telephone Note  I connected with Michele Moore on 11/21/20 at  2:30 PM EDT by telephone and verified that I am speaking with the correct person using two identifiers.   I discussed the limitations, risks, security and privacy concerns of performing an evaluation and management service by telephone and the availability of in person appointments. I also discussed with the patient that there may be a patient responsible charge related to this service. The patient expressed understanding and agreed to proceed.  The patient was at home I spoke with the patient from my  office The names of people involved in this encounter were: Michele Moore and Michele Docker, MD.   Chief Complaints: follow up from ultrasound  History of Present Illness: 70 y.o. G4P0 who presents in follow up from an ultrasound for dark discharge, sometimes more pink. She has had no discharge/bleeding since her last appointment.  She had an ultrasound that showed a 3 mm stripe with non-specific fluid in the endometrial canal.  There was only a small amount of this.  She has no fevers or chills.  The ultrasound was otherwise negative.   Observations/Objective: Physical Exam could not be performed. Because of the COVID-19 outbreak this visit was performed over the phone and not in person.   Imaging Results DG PAIN CLINIC C-ARM 1-60 MIN NO REPORT  Result Date: 09/17/2020 Fluoro was used, but no Radiologist interpretation will be provided. Please refer to "NOTES" tab for provider progress note.  US PELVIC COMPLETE WITH TRANSVAGINAL  Result Date: 11/15/2020 CLINICAL DATA:  Postmenopausal bleeding EXAM: TRANSABDOMINAL AND TRANSVAGINAL ULTRASOUND OF PELVIS TECHNIQUE: Both transabdominal and transvaginal ultrasound examinations of the pelvis were performed. Transabdominal technique was performed for global imaging of the pelvis including uterus, ovaries, adnexal regions, and pelvic cul-de-sac. It was necessary to  proceed with endovaginal exam following the transabdominal exam to visualize the uterus, endometrium, and ovaries. COMPARISON:  None FINDINGS: Uterus Measurements: 5.6 x 3.3 x 3.9 cm = volume: 37 mL. Anteverted. Atrophic. Heterogeneous myometrium. No focal mass. Endometrium Thickness: 3 mm. Small amount of nonspecific endometrial fluid. No focal mass. Right ovary Measurements: 1.3 x 1.2 x 1.1 cm = volume: 0.9 mL. Normal morphology without mass Left ovary Measurements: 1.7 x 1.1 x 1.2 cm = volume: 1.1 mL. Normal morphology without mass Other findings No free pelvic fluid.  No adnexal masses. IMPRESSION: Small amount of nonspecific endometrial fluid. Remainder of exam unremarkable. Electronically Signed   By: Lavonia Dana M.D.   On: 11/15/2020 14:12     Assessment and Plan:   ICD-10-CM   1. Postmenopausal bleeding  N95.0        Follow Up Instructions: Overall these are reassuring findings.  The fluid is of none specific significance and merits watching at this point.  We discussed considering treating for endometritis.  However, she has no overt symptoms of this apart from fluid found on ultrasound and her discharge.  If her symptoms continue, she may warrant a biopsy of her endometrium and treatment for endometritis, if that is negative.  She will continue to monitor and let me know if she has return of symptoms.   I discussed the assessment and treatment plan with the patient. The patient was provided an opportunity to ask questions and all were answered. The patient agreed with the plan and demonstrated an understanding of the instructions.   The patient was advised to call back or seek an in-person evaluation if the symptoms worsen or if  the condition fails to improve as anticipated.  I provided 14 minutes of non-face-to-face time during this encounter.  Michele Docker, MD  [facility], [facility] 11/21/2020 2:49 PM

## 2020-11-23 DIAGNOSIS — Z20822 Contact with and (suspected) exposure to covid-19: Secondary | ICD-10-CM | POA: Diagnosis not present

## 2020-11-26 ENCOUNTER — Ambulatory Visit (HOSPITAL_BASED_OUTPATIENT_CLINIC_OR_DEPARTMENT_OTHER): Payer: Medicare Other | Admitting: Student in an Organized Health Care Education/Training Program

## 2020-11-26 ENCOUNTER — Encounter: Payer: Self-pay | Admitting: Student in an Organized Health Care Education/Training Program

## 2020-11-26 ENCOUNTER — Ambulatory Visit
Admission: RE | Admit: 2020-11-26 | Discharge: 2020-11-26 | Disposition: A | Discharge status: Home / Self Care | Payer: Medicare Other | Source: Ambulatory Visit | Attending: Student in an Organized Health Care Education/Training Program | Admitting: Student in an Organized Health Care Education/Training Program

## 2020-11-26 ENCOUNTER — Other Ambulatory Visit: Payer: Self-pay

## 2020-11-26 VITALS — BP 144/69 | HR 77 | Temp 98.1°F | Resp 11 | Ht 71.0 in | Wt 270.0 lb

## 2020-11-26 DIAGNOSIS — G894 Chronic pain syndrome: Secondary | ICD-10-CM | POA: Diagnosis not present

## 2020-11-26 DIAGNOSIS — M47818 Spondylosis without myelopathy or radiculopathy, sacral and sacrococcygeal region: Secondary | ICD-10-CM | POA: Diagnosis not present

## 2020-11-26 DIAGNOSIS — Z881 Allergy status to other antibiotic agents status: Secondary | ICD-10-CM | POA: Diagnosis not present

## 2020-11-26 DIAGNOSIS — G5702 Lesion of sciatic nerve, left lower limb: Secondary | ICD-10-CM

## 2020-11-26 DIAGNOSIS — M549 Dorsalgia, unspecified: Secondary | ICD-10-CM | POA: Diagnosis not present

## 2020-11-26 DIAGNOSIS — Z880 Allergy status to penicillin: Secondary | ICD-10-CM | POA: Diagnosis not present

## 2020-11-26 MED ORDER — METHYLPREDNISOLONE ACETATE 40 MG/ML IJ SUSP
40.0000 mg | Freq: Once | INTRAMUSCULAR | Status: AC
  Administered 2020-11-26: 40 mg via INTRA_ARTICULAR

## 2020-11-26 MED ORDER — DEXAMETHASONE SODIUM PHOSPHATE 10 MG/ML IJ SOLN
10.0000 mg | Freq: Once | INTRAMUSCULAR | Status: AC
  Administered 2020-11-26: 10 mg

## 2020-11-26 MED ORDER — LIDOCAINE HCL 2 % IJ SOLN
INTRAMUSCULAR | Status: AC
  Filled 2020-11-26: qty 20

## 2020-11-26 MED ORDER — DEXAMETHASONE SODIUM PHOSPHATE 10 MG/ML IJ SOLN
INTRAMUSCULAR | Status: AC
  Filled 2020-11-26: qty 1

## 2020-11-26 MED ORDER — DIAZEPAM 5 MG PO TABS
ORAL_TABLET | ORAL | Status: AC
  Filled 2020-11-26: qty 1

## 2020-11-26 MED ORDER — ROPIVACAINE HCL 2 MG/ML IJ SOLN
INTRAMUSCULAR | Status: AC
  Filled 2020-11-26: qty 20

## 2020-11-26 MED ORDER — IOHEXOL 180 MG/ML  SOLN
INTRAMUSCULAR | Status: AC
  Filled 2020-11-26: qty 20

## 2020-11-26 MED ORDER — ROPIVACAINE HCL 2 MG/ML IJ SOLN
9.0000 mL | Freq: Once | INTRAMUSCULAR | Status: AC
  Administered 2020-11-26: 20 mL via PERINEURAL

## 2020-11-26 MED ORDER — DIAZEPAM 5 MG PO TABS
5.0000 mg | ORAL_TABLET | Freq: Once | ORAL | Status: AC
  Administered 2020-11-26: 5 mg via ORAL

## 2020-11-26 MED ORDER — LIDOCAINE HCL 2 % IJ SOLN
20.0000 mL | Freq: Once | INTRAMUSCULAR | Status: AC
  Administered 2020-11-26: 400 mg

## 2020-11-26 MED ORDER — IOHEXOL 180 MG/ML  SOLN
10.0000 mL | Freq: Once | INTRAMUSCULAR | Status: AC
  Administered 2020-11-26: 10 mL via INTRA_ARTICULAR

## 2020-11-26 MED ORDER — METHYLPREDNISOLONE ACETATE 40 MG/ML IJ SUSP
INTRAMUSCULAR | Status: AC
  Filled 2020-11-26: qty 1

## 2020-11-26 NOTE — Patient Instructions (Signed)

## 2020-11-26 NOTE — Progress Notes (Signed)
Safety precautions to be maintained throughout the outpatient stay will include: orient to surroundings, keep bed in low position, maintain call bell within reach at all times, provide assistance with transfer out of bed and ambulation.  

## 2020-11-26 NOTE — Progress Notes (Signed)
Patient's Name: Michele Moore  MRN: [id number]  Referring Provider: Juline Patch, MD  DOB: 1950-05-06  PCP: Juline Patch, MD  DOS: 11/26/2020  Note by: Gillis Santa, MD  Service setting: Ambulatory outpatient  Specialty: Interventional Pain Management  Patient type: Established  Location: [facility] (AMB) Pain Management Facility  Visit type: Interventional Procedure   Primary Reason for Visit: Interventional Pain Management Treatment. CC: Back Pain (lower)  Procedure:          Anesthesia, Analgesia, Anxiolysis:  Type: Therapeutic Sacroiliac Joint Steroid Injection            and left piriformis trigger point injection Region: Inferior Lumbosacral Region Level: PIIS (Posterior Inferior Iliac Spine) Laterality: Left-Side  Type: Local Anesthesia with p.o. Valium, 5 mg  Local Anesthetic: Lidocaine 1-2%  Position: Prone           Indications: 1. SI joint arthritis   2. Piriformis syndrome of left side   3. Chronic pain syndrome    Pain Score: Pre-procedure: 10-Worst pain ever/10 Post-procedure: 7 /10   Pre-op Assessment:  Michele Moore is a 70 y.o. (year old), female patient, seen today for interventional treatment. She  has a past surgical history that includes Carpal tunnel release; Rotator cuff repair; Ankle surgery; Tubal ligation; Colonoscopy (2000?); Upper gi endoscopy (2000?); Tonsillectomy; Fusion of talonavicular joint (Right, 08/03/2015); Colonoscopy with propofol (N/A, 10/22/2017); Esophagogastroduodenoscopy (egd) with propofol (N/A, 10/22/2017); polypectomy (N/A, 10/22/2017); Total knee revision (Right, 01/20/2018); and Joint replacement (Right). Michele Moore has a current medication list which includes the following prescription(s): albuterol, allopurinol, allopurinol, apremilast, aspirin ec, betamethasone dipropionate, betamethasone valerate, calcipotriene, vitamin d3, diclofenac sodium, ferrous sulfate, gabapentin, hydralazine, hydrocodone-acetaminophen, [START ON 12/25/2020]  hydrocodone-acetaminophen, [START ON 01/24/2021] hydrocodone-acetaminophen, ketoconazole, levothyroxine, losartan, meclizine, methotrexate, montelukast, multiple vitamins-calcium, mupirocin ointment, omega-3 acid ethyl esters, omeprazole, otezla, polyethyl glycol-propyl glycol, potassium chloride sa, tizanidine, triamcinolone cream, triamterene-hydrochlorothiazide, [DISCONTINUED] fluticasone, and [DISCONTINUED] mometasone. Her primarily concern today is the Back Pain (lower)  Initial Vital Signs:  Pulse/HCG Rate: 100  Temp: 98.1 F (36.7 C) Resp: 16 BP: 134/84 SpO2: 98 %  BMI: Estimated body mass index is 37.66 kg/m as calculated from the following:   Height as of this encounter: 5\' 11"  (1.803 m).   Weight as of this encounter: 270 lb (122.5 kg).  Risk Assessment: Allergies: Reviewed. She is allergic to ampicillin, penicillins, vancomycin, and vibramycin [doxycycline calcium].  Allergy Precautions: None required Coagulopathies: Reviewed. None identified.  Blood-thinner therapy: None at this time Active Infection(s): Reviewed. None identified. Michele Moore is afebrile  Site Confirmation: Michele Moore was asked to confirm the procedure and laterality before marking the site Procedure checklist: Completed Consent: Before the procedure and under the influence of no sedative(s), amnesic(s), or anxiolytics, the patient was informed of the treatment options, risks and possible complications. To fulfill our ethical and legal obligations, as recommended by the American Medical Association's Code of Ethics, I have informed the patient of my clinical impression; the nature and purpose of the treatment or procedure; the risks, benefits, and possible complications of the intervention; the alternatives, including doing nothing; the risk(s) and benefit(s) of the alternative treatment(s) or procedure(s); and the risk(s) and benefit(s) of doing nothing. The patient was provided information about the general risks and  possible complications associated with the procedure. These may include, but are not limited to: failure to achieve desired goals, infection, bleeding, organ or nerve damage, allergic reactions, paralysis, and death. In addition, the patient was informed of those risks and complications  associated to the procedure, such as failure to decrease pain; infection; bleeding; organ or nerve damage with subsequent damage to sensory, motor, and/or autonomic systems, resulting in permanent pain, numbness, and/or weakness of one or several areas of the body; allergic reactions; (i.e.: anaphylactic reaction); and/or death. Furthermore, the patient was informed of those risks and complications associated with the medications. These include, but are not limited to: allergic reactions (i.e.: anaphylactic or anaphylactoid reaction(s)); adrenal axis suppression; blood sugar elevation that in diabetics may result in ketoacidosis or comma; water retention that in patients with history of congestive heart failure may result in shortness of breath, pulmonary edema, and decompensation with resultant heart failure; weight gain; swelling or edema; medication-induced neural toxicity; particulate matter embolism and blood vessel occlusion with resultant organ, and/or nervous system infarction; and/or aseptic necrosis of one or more joints. Finally, the patient was informed that Medicine is not an exact science; therefore, there is also the possibility of unforeseen or unpredictable risks and/or possible complications that may result in a catastrophic outcome. The patient indicated having understood very clearly. We have given the patient no guarantees and we have made no promises. Enough time was given to the patient to ask questions, all of which were answered to the patient's satisfaction. Michele Moore has indicated that she wanted to continue with the procedure. Attestation: I, the ordering provider, attest that I have discussed with the  patient the benefits, risks, side-effects, alternatives, likelihood of achieving goals, and potential problems during recovery for the procedure that I have provided informed consent. Date  Time: 11/26/2020  8:42 AM  Pre-Procedure Preparation:  Monitoring: As per clinic protocol. Respiration, ETCO2, SpO2, BP, heart rate and rhythm monitor placed and checked for adequate function Safety Precautions: Patient was assessed for positional comfort and pressure points before starting the procedure. Time-out: I initiated and conducted the "Time-out" before starting the procedure, as per protocol. The patient was asked to participate by confirming the accuracy of the "Time Out" information. Verification of the correct person, site, and procedure were performed and confirmed by me, the nursing staff, and the patient. "Time-out" conducted as per Joint Commission's Universal Protocol (UP.01.01.01). Time: 7353  Description of Procedure:          Target Area: Inferior, posterior, aspect of the sacroiliac fissure Approach: Posterior, paraspinal, ipsilateral approach. Area Prepped: Entire Lower Lumbosacral Region Prepping solution: DuraPrep (Iodine Povacrylex [0.7% available iodine] and Isopropyl Alcohol, 74% w/w) Safety Precautions: Aspiration looking for blood return was conducted prior to all injections. At no point did we inject any substances, as a needle was being advanced. No attempts were made at seeking any paresthesias. Safe injection practices and needle disposal techniques used. Medications properly checked for expiration dates. SDV (single dose vial) medications used. Description of the Procedure: Protocol guidelines were followed. The patient was placed in position over the procedure table. The target area was identified and the area prepped in the usual manner. Skin & deeper tissues infiltrated with local anesthetic. Appropriate amount of time allowed to pass for local anesthetics to take effect. The  procedure needle was advanced under fluoroscopic guidance into the sacroiliac joint until a firm endpoint was obtained. Proper needle placement secured. Negative aspiration confirmed. Solution injected in intermittent fashion, asking for systemic symptoms every 0.5cc of injectate. The needles were then removed and the area cleansed, making sure to leave some of the prepping solution back to take advantage of its long term bactericidal properties. Vitals:   11/26/20 2992 11/26/20 0935 11/26/20  0941  BP: 134/84 113/87 (!) 144/69  Pulse: 100 80 77  Resp: 16 18 11   Temp: 98.1 F (36.7 C)    TempSrc: Temporal    SpO2: 98% 97% 97%  Weight: 270 lb (122.5 kg)    Height: 5\' 11"  (1.803 m)      Start Time: 0933 hrs. End Time: 0939 hrs. Materials:  Needle(s) Type: Spinal Needle Gauge: 22G Length: 3.5-in Medication(s): Please see orders for medications and dosing details. 5 cc solution made of 4 cc of 0.2% ropivacaine, 1 cc of methylprednisolone, 40 mg/cc.  2.5 cc injected intra-articular, 2.5 cc injected periarticular.  A LEFT piriformis injection was also performed 1 cm inferior, 1 cm lateral and 1 cm deep to the inferior sacroiliac fissure with contrast highlighting piriformis muscle striations.  5 cc solution made of 4 cc of 0.2% ropivacaine, 1 cc of Decadron 10 mg/cc was injected into the LEFT piriformis muscle under fluoroscopy.  Patient denied any pain radiating down her LEFT leg during injection.  Imaging Guidance (Non-Spinal):          Type of Imaging Technique: Fluoroscopy Guidance (Non-Spinal) Indication(s): Assistance in needle guidance and placement for procedures requiring needle placement in or near specific anatomical locations not easily accessible without such assistance. Exposure Time: Please see nurses notes. Contrast: Before injecting any contrast, we confirmed that the patient did not have an allergy to iodine, shellfish, or radiological contrast. Once satisfactory needle  placement was completed at the desired level, radiological contrast was injected. Contrast injected under live fluoroscopy. No contrast complications. See chart for type and volume of contrast used. Fluoroscopic Guidance: I was personally present during the use of fluoroscopy. "Tunnel Vision Technique" used to obtain the best possible view of the target area. Parallax error corrected before commencing the procedure. "Direction-depth-direction" technique used to introduce the needle under continuous pulsed fluoroscopy. Once target was reached, antero-posterior, oblique, and lateral fluoroscopic projection used confirm needle placement in all planes. Images permanently stored in EMR. Interpretation: I personally interpreted the imaging intraoperatively. Adequate needle placement confirmed in multiple planes. Appropriate spread of contrast into desired area was observed. No evidence of afferent or efferent intravascular uptake. Permanent images saved into the patient's record.  Post-operative Assessment:  Post-procedure Vital Signs:  Pulse/HCG Rate: 77  Temp: 98.1 F (36.7 C) Resp: 11 BP: (!) 144/69 SpO2: 97 %  EBL: None  Complications: No immediate post-treatment complications observed by team, or reported by patient.  Note: The patient tolerated the entire procedure well. A repeat set of vitals were taken after the procedure and the patient was kept under observation following institutional policy, for this type of procedure. Post-procedural neurological assessment was performed, showing return to baseline, prior to discharge. The patient was provided with post-procedure discharge instructions, including a section on how to identify potential problems. Should any problems arise concerning this procedure, the patient was given instructions to immediately contact us, at any time, without hesitation. In any case, we plan to contact the patient by telephone for a follow-up status report regarding this  interventional procedure.  Comments:  No additional relevant information.  Plan of Care  Orders:  Orders Placed This Encounter  Procedures   DG PAIN CLINIC C-ARM 1-60 MIN NO REPORT    Intraoperative interpretation by procedural physician at [place].    Standing Status:   Standing    Number of Occurrences:   1    Order Specific Question:   Reason for exam:    Answer:  Assistance in needle guidance and placement for procedures requiring needle placement in or near specific anatomical locations not easily accessible without such assistance.    Medications ordered for procedure: Meds ordered this encounter  Medications   iohexol (OMNIPAQUE) 180 MG/ML injection 10 mL    Must be Myelogram-compatible. If not available, you may substitute with a water-soluble, non-ionic, hypoallergenic, myelogram-compatible radiological contrast medium.   lidocaine (XYLOCAINE) 2 % (with pres) injection 400 mg   dexamethasone (DECADRON) injection 10 mg   ropivacaine (PF) 2 mg/mL (0.2%) (NAROPIN) injection 9 mL   methylPREDNISolone acetate (DEPO-MEDROL) injection 40 mg   diazepam (VALIUM) tablet 5 mg    Make sure Flumazenil is available in the pyxis when using this medication. If oversedation occurs, administer 0.2 mg IV over 15 sec. If after 45 sec no response, administer 0.2 mg again over 1 min; may repeat at 1 min intervals; not to exceed 4 doses (1 mg)   Medications administered: We administered iohexol, lidocaine, dexamethasone, ropivacaine (PF) 2 mg/mL (0.2%), methylPREDNISolone acetate, and diazepam.  See the medical record for exact dosing, route, and time of administration.  Follow-up plan:   Return in about 4 weeks (around 12/24/2020) for Post Procedure Evaluation, virtual.       Recent Visits Date Type Provider Dept  11/15/20 Office Visit Gillis Santa, MD [facility]  10/08/20 Telemedicine Gillis Santa, MD [facility]  09/17/20 Procedure visit Gillis Santa, MD [facility]  Showing recent visits within past 90 days and meeting all other requirements Today's Visits Date Type Provider Dept  11/26/20 Procedure visit Gillis Santa, MD [facility]  Showing today's visits and meeting all other requirements Future Appointments Date Type Provider Dept  12/24/20 Appointment Gillis Santa, MD [facility]  02/12/21 Appointment Gillis Santa, MD [facility]  Showing future appointments within next 90 days and meeting all other requirements Disposition: Discharge home  Discharge Date & Time: 11/26/2020; 0945 hrs.   Primary Care Physician: Juline Patch, MD Location: [facility] Outpatient [facility] Management Facility Note by: Gillis Santa, MD Date: 11/26/2020; Time: 10:01 AM  Disclaimer:  Medicine is not an exact science. The only guarantee in medicine is that nothing is guaranteed. It is important to note that the decision to proceed with this intervention was based on the information collected from the patient. The Data and conclusions were drawn from the patient's questionnaire, the interview, and the physical examination. Because the information was provided in large part by the patient, it cannot be guaranteed that it has not been purposely or unconsciously manipulated. Every effort has been made to obtain as much relevant data as possible for this evaluation. It is important to note that the conclusions that lead to this procedure are derived in large part from the available data. Always take into account that the treatment will also be dependent on availability of resources and existing treatment guidelines, considered by other Pain Management Practitioners as being common knowledge and practice, at the time of the intervention. For Medico-Legal purposes, it is also important to point out that variation in procedural techniques and pharmacological choices are the acceptable norm. The indications, contraindications,  technique, and results of the above procedure should only be interpreted and judged by a Board-Certified Interventional Pain Specialist with extensive familiarity and expertise in the same exact procedure and technique.

## 2020-11-27 ENCOUNTER — Telehealth: Payer: Self-pay | Admitting: *Deleted

## 2020-11-27 NOTE — Telephone Encounter (Signed)
Post procedure call.  Patient states she is doing well.  No questions or concerns.

## 2020-11-28 DIAGNOSIS — M19071 Primary osteoarthritis, right ankle and foot: Secondary | ICD-10-CM | POA: Diagnosis not present

## 2020-11-28 DIAGNOSIS — M79675 Pain in left toe(s): Secondary | ICD-10-CM | POA: Diagnosis not present

## 2020-11-28 DIAGNOSIS — B351 Tinea unguium: Secondary | ICD-10-CM | POA: Diagnosis not present

## 2020-11-28 DIAGNOSIS — L4050 Arthropathic psoriasis, unspecified: Secondary | ICD-10-CM | POA: Diagnosis not present

## 2020-11-28 DIAGNOSIS — M79674 Pain in right toe(s): Secondary | ICD-10-CM | POA: Diagnosis not present

## 2020-12-03 ENCOUNTER — Other Ambulatory Visit: Payer: Self-pay | Admitting: Family Medicine

## 2020-12-03 DIAGNOSIS — I10 Essential (primary) hypertension: Secondary | ICD-10-CM

## 2020-12-04 ENCOUNTER — Other Ambulatory Visit: Payer: Self-pay | Admitting: Family Medicine

## 2020-12-04 DIAGNOSIS — K219 Gastro-esophageal reflux disease without esophagitis: Secondary | ICD-10-CM

## 2020-12-09 ENCOUNTER — Other Ambulatory Visit: Payer: Self-pay | Admitting: Family Medicine

## 2020-12-09 DIAGNOSIS — J069 Acute upper respiratory infection, unspecified: Secondary | ICD-10-CM

## 2020-12-09 NOTE — Telephone Encounter (Signed)
Requested Prescriptions  Pending Prescriptions Disp Refills  . montelukast (SINGULAIR) 10 MG tablet [Pharmacy Med Name: MONTELUKAST 10MG  TABLETS] 90 tablet 1    Sig: TAKE 1 TABLET(10 MG) BY MOUTH AT BEDTIME     Pulmonology:  Leukotriene Inhibitors Passed - 12/09/2020 11:16 AM      Passed - Valid encounter within last 12 months    Recent Outpatient Visits          3 months ago [facility] Juline Patch, MD   6 months ago Hypertension, unspecified type   [facility] Juline Patch, MD   8 months ago Viral upper respiratory tract infection   [facility] Juline Patch, MD   1 year ago Hypertension, unspecified type   [facility] Juline Patch, MD   1 year ago Labyrinthitis of both ears   [facility] Juline Patch, MD

## 2020-12-10 DIAGNOSIS — L4050 Arthropathic psoriasis, unspecified: Secondary | ICD-10-CM | POA: Diagnosis not present

## 2020-12-10 DIAGNOSIS — L409 Psoriasis, unspecified: Secondary | ICD-10-CM | POA: Diagnosis not present

## 2020-12-10 DIAGNOSIS — M1A00X0 Idiopathic chronic gout, unspecified site, without tophus (tophi): Secondary | ICD-10-CM | POA: Diagnosis not present

## 2020-12-10 DIAGNOSIS — Z79899 Other long term (current) drug therapy: Secondary | ICD-10-CM | POA: Diagnosis not present

## 2020-12-20 ENCOUNTER — Other Ambulatory Visit: Payer: Self-pay | Admitting: Family Medicine

## 2020-12-20 ENCOUNTER — Telehealth: Payer: Self-pay

## 2020-12-21 NOTE — Telephone Encounter (Signed)
Requested Prescriptions  Pending Prescriptions Disp Refills  . albuterol (VENTOLIN HFA) 108 (90 Base) MCG/ACT inhaler [Pharmacy Med Name: ALBUTEROL HFA INH(200 PUFFS)18GM] 18 g 0    Sig: INHALE 2 PUFFS INTO THE LUNGS EVERY 4 HOURS AS NEEDED FOR WHEEZING     Pulmonology:  Beta Agonists Failed - 12/20/2020  9:17 PM      Failed - One inhaler should last at least one month. If the patient is requesting refills earlier, contact the patient to check for uncontrolled symptoms.      Passed - Valid encounter within last 12 months    Recent Outpatient Visits          3 months ago [facility] Juline Patch, MD   6 months ago Hypertension, unspecified type   [facility] Juline Patch, MD   9 months ago Viral upper respiratory tract infection   [facility] Juline Patch, MD   1 year ago Hypertension, unspecified type   [facility] Juline Patch, MD   1 year ago Labyrinthitis of both ears   [facility] Juline Patch, MD

## 2020-12-24 ENCOUNTER — Ambulatory Visit
Payer: Medicare Other | Attending: Student in an Organized Health Care Education/Training Program | Admitting: Student in an Organized Health Care Education/Training Program

## 2020-12-24 ENCOUNTER — Other Ambulatory Visit: Payer: Self-pay

## 2020-12-24 ENCOUNTER — Encounter: Payer: Self-pay | Admitting: Student in an Organized Health Care Education/Training Program

## 2020-12-24 DIAGNOSIS — M47816 Spondylosis without myelopathy or radiculopathy, lumbar region: Secondary | ICD-10-CM

## 2020-12-24 DIAGNOSIS — G5702 Lesion of sciatic nerve, left lower limb: Secondary | ICD-10-CM | POA: Diagnosis not present

## 2020-12-24 DIAGNOSIS — M7062 Trochanteric bursitis, left hip: Secondary | ICD-10-CM | POA: Diagnosis not present

## 2020-12-24 DIAGNOSIS — M47818 Spondylosis without myelopathy or radiculopathy, sacral and sacrococcygeal region: Secondary | ICD-10-CM

## 2020-12-24 DIAGNOSIS — G894 Chronic pain syndrome: Secondary | ICD-10-CM

## 2020-12-24 NOTE — Progress Notes (Signed)
Patient: Michele Moore  Service Category: E/M  Provider: Gillis Santa, MD  DOB: 06-01-1950  DOS: 12/24/2020  Location: Office  MRN: [id number]  Setting: Ambulatory outpatient  Referring Provider: Juline Patch, MD  Type: Established Patient  Specialty: Interventional Pain Management  PCP: Juline Patch, MD  Location: Home  Delivery: TeleHealth     Virtual Encounter - Pain Management PROVIDER NOTE: Information contained herein reflects review and annotations entered in association with encounter. Interpretation of such information and data should be left to medically-trained personnel. Information provided to patient can be located elsewhere in the medical record under "Patient Instructions". Document created using STT-dictation technology, any transcriptional errors that may result from process are unintentional.    Contact & Pharmacy Preferred: [place]: [phone] (home) Mobile: [phone] (mobile) E-mail: No e-mail address on record  [facility] [place] [address] [place] [address] Phone: [phone] Fax: [phone]   Pre-screening  Michele Moore offered "in-person" vs "virtual" encounter. She indicated preferring virtual for this encounter.   Reason COVID-19*  Social distancing based on CDC and AMA recommendations.   I contacted [name] on 12/24/2020 via telephone.      I clearly identified myself as Gillis Santa, MD. I verified that I was speaking with the correct person using two identifiers (Name: Michele Moore, and date of birth: 1950-04-05).  Consent I sought verbal advanced consent from [name] for virtual visit interactions. I informed [name] of possible security and privacy concerns, risks, and limitations associated with providing "not-in-person" medical evaluation and management services. I also informed [name] of the availability of "in-person" appointments. Finally,  I informed her that there would be a charge for the virtual visit and that she could be  personally, fully or partially, financially responsible for it. [name] expressed understanding and agreed to proceed.   Historic Elements   Michele Moore is a 70 y.o. year old, female patient evaluated today after our last contact on 11/26/2020. Michele Moore  has a past medical history of Allergy, Anemia, Arthritis, Asthma, Cataract, Chronic kidney disease, Collagen vascular disease ([place]), Complication of anesthesia, Dyspnea, Dysrhythmia, Family history of adverse reaction to anesthesia, GERD (gastroesophageal reflux disease), Glaucoma, H/O tooth extraction, Hemorrhoid, History of hiatal hernia, History of kidney stones, Hypertension, Hypothyroidism, Migraines, Mixed hyperlipidemia, Multiple gastric ulcers, Thyroid disease, and Wears dentures. She also  has a past surgical history that includes Carpal tunnel release; Rotator cuff repair; Ankle surgery; Tubal ligation; Colonoscopy (2000?); Upper gi endoscopy (2000?); Tonsillectomy; Fusion of talonavicular joint (Right, 08/03/2015); Colonoscopy with propofol (N/A, 10/22/2017); Esophagogastroduodenoscopy (egd) with propofol (N/A, 10/22/2017); polypectomy (N/A, 10/22/2017); Total knee revision (Right, 01/20/2018); and Joint replacement (Right). Michele Moore has a current medication list which includes the following prescription(s): allopurinol, allopurinol, apremilast, aspirin ec, betamethasone dipropionate, betamethasone valerate, calcipotriene, vitamin d3, diclofenac sodium, ferrous sulfate, gabapentin, hydralazine, hydrocodone-acetaminophen, [START ON 12/25/2020] hydrocodone-acetaminophen, [START ON 01/24/2021] hydrocodone-acetaminophen, ketoconazole, levothyroxine, losartan, meclizine, methotrexate, montelukast, multiple vitamins-calcium, mupirocin ointment, omega-3 acid ethyl esters, omeprazole, otezla, polyethyl glycol-propyl glycol, potassium chloride sa, tizanidine, triamcinolone  cream, triamterene-hydrochlorothiazide, albuterol, [DISCONTINUED] fluticasone, and [DISCONTINUED] mometasone. She  reports that she quit smoking about 27 years ago. Her smoking use included cigarettes. She has a 40.00 pack-year smoking history. She has never used smokeless tobacco. She reports that she does not drink alcohol and does not use drugs. Michele Moore is allergic to  ampicillin, penicillins, vancomycin, and vibramycin [doxycycline calcium].   HPI  Today, she is being contacted for a post-procedure assessment.   Post-Procedure Evaluation  Procedure (11/26/2020):   Type: Therapeutic Sacroiliac Joint Steroid Injection and left piriformis trigger point injection Region: Inferior Lumbosacral Region Level: PIIS (Posterior Inferior Iliac Spine) Laterality: Left-Side  Anxiolysis: Please see nurses note.  Effectiveness during initial hour after procedure (Ultra-Short Term Relief): 100 %   Local anesthetic used: Long-acting (4-6 hours) Effectiveness: Defined as any analgesic benefit obtained secondary to the administration of local anesthetics. This carries significant diagnostic value as to the etiological location, or anatomical origin, of the pain. Duration of benefit is expected to coincide with the duration of the local anesthetic used.  Effectiveness during initial 4-6 hours after procedure (Short-Term Relief): 100 %   Long-term benefit: Defined as any relief past the pharmacologic duration of the local anesthetics.  Effectiveness past the initial 6 hours after procedure (Long-Term Relief): 100 % (lasting 2-3 days then returned)   Benefits, current: Defined as benefit present at the time of this evaluation.   Analgesia:  <25%    Pharmacotherapy Assessment   Analgesic: Norco 7.5 mg TID PRN #90/month, 22.5    Monitoring:  PMP: PDMP reviewed during this encounter.       Pharmacotherapy: No side-effects or adverse reactions reported. Compliance: No problems  identified. Effectiveness: Clinically acceptable. Plan: Refer to "POC". UDS:  Summary  Date Value Ref Range Status  06/14/2020 Note  Final    Comment:    ==================================================================== [employer] [facility] 13 (MW) ==================================================================== Test                             Result       Flag       Units  Drug Present and Declared for Prescription Verification   Hydrocodone                    1019         EXPECTED   ng/mg creat   Hydromorphone                  166          EXPECTED   ng/mg creat   Dihydrocodeine                 71           EXPECTED   ng/mg creat   Norhydrocodone                 536          EXPECTED   ng/mg creat    Sources of hydrocodone include scheduled prescription medications.    Hydromorphone, dihydrocodeine and norhydrocodone are expected    metabolites of hydrocodone. Hydromorphone and dihydrocodeine are    also available as scheduled prescription medications.  ==================================================================== Test                      Result    Flag   Units      Ref Range   Creatinine              166              mg/dL      >=20 ==================================================================== Declared Medications:  The flagging and interpretation on this report are based on the  following declared medications.  Unexpected results may arise from  inaccuracies  in the declared medications.   **Note: The testing scope of this panel includes these medications:   Hydrocodone (Norco)   **Note: The testing scope of this panel does not include the  following reported medications:   Acetaminophen (Norco)  Albuterol (Ventolin HFA)  Allopurinol (Zyloprim)  Aspirin  Betamethasone  Fluticasone (Flonase)  Gabapentin (Neurontin)  Hydralazine (Apresoline)  Hydrochlorothiazide (Maxzide)  Iron  Ketoconazole (Nizoral)  Levothyroxine (Synthroid)  Losartan  (Cozaar)  Meclizine (Antivert)  Methotrexate  Metronidazole (Flagyl)  Mometasone (Nasonex)  Montelukast (Singulair)  Multivitamin  Mupirocin (Bactroban)  Omega-3 Fatty Acids  Omeprazole (Prilosec)  Polyethylene Glycol  Potassium (Klor-Con)  Tizanidine (Zanaflex)  Topical Diclofenac (Voltaren)  Triamcinolone (Kenalog)  Triamterene (Maxzide)  Vitamin D3 ==================================================================== For clinical consultation, please call [phone]. ====================================================================      Laboratory Chemistry Profile   Renal Lab Results  Component Value Date   BUN 30 (H) 06/12/2020   CREATININE 1.64 (H) 06/12/2020   BCR 18 06/12/2020   GFRAA 30 (L) 08/28/2019   GFRNONAA 30 (L) 12/07/2019    Hepatic Lab Results  Component Value Date   AST 25 06/12/2020   ALT 16 06/12/2020   ALBUMIN 4.4 06/12/2020   ALKPHOS 98 06/12/2020    Electrolytes Lab Results  Component Value Date   NA 144 06/12/2020   K 4.3 06/12/2020   CL 106 06/12/2020   CALCIUM 9.9 06/12/2020   MG 2.0 10/01/2018   PHOS 4.0 04/12/2019    Bone No results found for: VD25OH, VD125OH2TOT, QV9563OV5, IE3329JJ8, 25OHVITD1, 25OHVITD2, 25OHVITD3, TESTOFREE, TESTOSTERONE  Inflammation (CRP: Acute Phase) (ESR: Chronic Phase) Lab Results  Component Value Date   CRP 0.9 04/23/2020   ESRSEDRATE 45 (H) 08/30/2020         Note: Above Lab results reviewed.   Assessment  The primary encounter diagnosis was SI joint arthritis. Diagnoses of Piriformis syndrome of left side, Trochanteric bursitis of left hip, Lumbar spondylosis, and Chronic pain syndrome were also pertinent to this visit.  Plan of Care   Unfortunately no significant pain relief after SI joint and piriformis injection.   Do not recommend repeating.   Continue with medication management visit scheduled next month.  Follow-up plan:   Return for Keep sch. appt.    Recent Visits Date  Type Provider Dept  11/26/20 Procedure visit Gillis Santa, MD [facility]  11/15/20 Office Visit Gillis Santa, MD [facility]  10/08/20 Telemedicine Gillis Santa, MD [facility]  Showing recent visits within past 90 days and meeting all other requirements Today's Visits Date Type Provider Dept  12/24/20 Office Visit Gillis Santa, MD [facility]  Showing today's visits and meeting all other requirements Future Appointments Date Type Provider Dept  02/12/21 Appointment Gillis Santa, MD [facility]  Showing future appointments within next 90 days and meeting all other requirements I discussed the assessment and treatment plan with the patient. The patient was provided an opportunity to ask questions and all were answered. The patient agreed with the plan and demonstrated an understanding of the instructions.  Patient advised to call back or seek an in-person evaluation if the symptoms or condition worsens.  Duration of encounter: 48mnutes.  Note by: BGillis Santa MD Date: 12/24/2020; Time: 1:46 PM

## 2021-01-01 ENCOUNTER — Inpatient Hospital Stay: Payer: Medicare Other | Attending: Oncology

## 2021-01-01 DIAGNOSIS — I129 Hypertensive chronic kidney disease with stage 1 through stage 4 chronic kidney disease, or unspecified chronic kidney disease: Secondary | ICD-10-CM | POA: Insufficient documentation

## 2021-01-01 DIAGNOSIS — D649 Anemia, unspecified: Secondary | ICD-10-CM

## 2021-01-01 DIAGNOSIS — D631 Anemia in chronic kidney disease: Secondary | ICD-10-CM | POA: Insufficient documentation

## 2021-01-01 DIAGNOSIS — Z87891 Personal history of nicotine dependence: Secondary | ICD-10-CM | POA: Insufficient documentation

## 2021-01-01 DIAGNOSIS — M069 Rheumatoid arthritis, unspecified: Secondary | ICD-10-CM | POA: Diagnosis not present

## 2021-01-01 DIAGNOSIS — Z79631 Long term (current) use of antimetabolite agent: Secondary | ICD-10-CM | POA: Insufficient documentation

## 2021-01-01 DIAGNOSIS — N1832 Chronic kidney disease, stage 3b: Secondary | ICD-10-CM | POA: Insufficient documentation

## 2021-01-01 DIAGNOSIS — K9089 Other intestinal malabsorption: Secondary | ICD-10-CM

## 2021-01-01 LAB — CBC WITH DIFFERENTIAL/PLATELET
Abs Immature Granulocytes: 0.01 10*3/uL (ref 0.00–0.07)
Basophils Absolute: 0 10*3/uL (ref 0.0–0.1)
Basophils Relative: 1 %
Eosinophils Absolute: 0.2 10*3/uL (ref 0.0–0.5)
Eosinophils Relative: 3 %
HCT: 32.7 % — ABNORMAL LOW (ref 36.0–46.0)
Hemoglobin: 10.5 g/dL — ABNORMAL LOW (ref 12.0–15.0)
Immature Granulocytes: 0 %
Lymphocytes Relative: 23 %
Lymphs Abs: 1.2 10*3/uL (ref 0.7–4.0)
MCH: 30.2 pg (ref 26.0–34.0)
MCHC: 32.1 g/dL (ref 30.0–36.0)
MCV: 94 fL (ref 80.0–100.0)
Monocytes Absolute: 0.4 10*3/uL (ref 0.1–1.0)
Monocytes Relative: 7 %
Neutro Abs: 3.4 10*3/uL (ref 1.7–7.7)
Neutrophils Relative %: 66 %
Platelets: 181 10*3/uL (ref 150–400)
RBC: 3.48 MIL/uL — ABNORMAL LOW (ref 3.87–5.11)
RDW: 13.5 % (ref 11.5–15.5)
WBC: 5.2 10*3/uL (ref 4.0–10.5)
nRBC: 0 % (ref 0.0–0.2)

## 2021-01-01 LAB — TECHNOLOGIST SMEAR REVIEW

## 2021-01-02 LAB — FOLATE: Folate: 54 ng/mL (ref >5.9)

## 2021-01-02 LAB — IRON AND TIBC
Iron: 59 ug/dL (ref 28–170)
Saturation Ratios: 19 % (ref 10.4–31.8)
TIBC: 277 ug/dL (ref 250–450)
UIBC: 253 ug/dL

## 2021-01-02 LAB — FERRITIN: Ferritin: 133 ng/mL (ref 11–307)

## 2021-01-03 ENCOUNTER — Other Ambulatory Visit: Payer: Self-pay

## 2021-01-03 ENCOUNTER — Ambulatory Visit: Payer: Medicare Other | Admitting: Oncology

## 2021-01-03 ENCOUNTER — Encounter: Payer: Self-pay | Admitting: Oncology

## 2021-01-03 ENCOUNTER — Ambulatory Visit: Payer: Medicare Other

## 2021-01-03 ENCOUNTER — Inpatient Hospital Stay: Payer: Medicare Other

## 2021-01-03 ENCOUNTER — Inpatient Hospital Stay (HOSPITAL_BASED_OUTPATIENT_CLINIC_OR_DEPARTMENT_OTHER): Payer: Medicare Other | Admitting: Oncology

## 2021-01-03 VITALS — BP 140/85 | HR 109 | Temp 98.7°F | Resp 18 | Wt 247.9 lb

## 2021-01-03 DIAGNOSIS — M069 Rheumatoid arthritis, unspecified: Secondary | ICD-10-CM | POA: Diagnosis not present

## 2021-01-03 DIAGNOSIS — Z79631 Long term (current) use of antimetabolite agent: Secondary | ICD-10-CM

## 2021-01-03 DIAGNOSIS — N183 Chronic kidney disease, stage 3 unspecified: Secondary | ICD-10-CM

## 2021-01-03 DIAGNOSIS — D649 Anemia, unspecified: Secondary | ICD-10-CM

## 2021-01-03 DIAGNOSIS — N1832 Chronic kidney disease, stage 3b: Secondary | ICD-10-CM | POA: Diagnosis not present

## 2021-01-03 DIAGNOSIS — Z87891 Personal history of nicotine dependence: Secondary | ICD-10-CM | POA: Diagnosis not present

## 2021-01-03 DIAGNOSIS — D631 Anemia in chronic kidney disease: Secondary | ICD-10-CM | POA: Diagnosis not present

## 2021-01-03 DIAGNOSIS — I129 Hypertensive chronic kidney disease with stage 1 through stage 4 chronic kidney disease, or unspecified chronic kidney disease: Secondary | ICD-10-CM | POA: Diagnosis not present

## 2021-01-03 MED ORDER — SODIUM CHLORIDE 0.9 % IV SOLN: 200.0000 mg | Freq: Once | INTRAVENOUS | Status: DC

## 2021-01-03 MED ORDER — SODIUM CHLORIDE 0.9 % IV SOLN
Freq: Once | INTRAVENOUS | Status: AC
  Filled 2021-01-03: qty 250

## 2021-01-03 MED ORDER — EPOETIN ALFA-EPBX 40000 UNIT/ML IJ SOLN: 40000.0000 [IU] | Freq: Once | INTRAMUSCULAR | Status: DC

## 2021-01-03 MED ORDER — IRON SUCROSE 20 MG/ML IV SOLN
200.0000 mg | Freq: Once | INTRAVENOUS | Status: AC
  Administered 2021-01-03: 200 mg via INTRAVENOUS

## 2021-01-03 NOTE — Progress Notes (Signed)
Hematology/Oncology Consult note [phone] [phone]      Clinic Day:  01/03/2021  Referring physician: Juline Patch, MD  Chief Complaint: Michele Moore is a 70 y.o. female presents for anemia in stage IIIb chronic kidney disease   Michele Moore is a 70 y.o.afemale who has above oncology history reviewed by me today presented for follow up visit for management of anemia in CKD  PERTINENT HEMATOLOGY HISTORY Patient previously followed up by Dr.Corcoran, patient switched care to me on 08/30/20 Extensive medical record review was performed by me  # anemia in stage IIIb chronic kidney disease.  Work-up on 04/23/2020 revealed a hematocrit of 35.0, hemoglobin 11.4, MCV 92.3, platelets 168,000, WBC 4,600 with an ANC of 3000. Ferritin was 107 with an iron saturation of 37% and a TIBC of 315. Sed rate was 54 and CRP was 0.9.  Vitamin B12 was 429 and folate >100.0.   10/22/2017 Colonoscopy for heme positive stool revealed one 7 mm polyp in the ascending colon, one 3 mm polyp in the transverse colon, and three 1 to 4 mm polyps in the sigmoid colon. There were non-bleeding internal hemorrhoids. There were petechia(e) in the rectum. Pathology showed two tubular adenomas and one hyperplastic polyp. EGD on 10/22/2017 showed gastritis and one gastric polyp.  She has Sjogren's syndrome, rheumatoid arthritis, hypertension, and proteinuria.  She takes oral iron once a day.  INTERVAL HISTORY Michele Moore is a 70 y.o. female who has above history reviewed by me today presents for follow up visit for anemia secondary to chronic kidney disease. Chronic fatigue unchanged. No new complaints.   Past Medical History:  Diagnosis Date   Allergy    Anemia    Arthritis    Asthma    Cataract    Chronic kidney disease    STAGE 3 PER DR EASON 08/02/15   Collagen vascular disease ([place])    Complication of anesthesia    WOKE UP DURING COLONOSCOPY   Dyspnea     Dysrhythmia    IRREGULAR HEART BEAT   Family history of adverse reaction to anesthesia    sister difficult to put to sleep   GERD (gastroesophageal reflux disease)    Glaucoma    H/O tooth extraction    all lower teeth 1/19   Hemorrhoid    History of hiatal hernia    History of kidney stones    H/O   Hypertension    Hypothyroidism    Migraines    Mixed hyperlipidemia    Multiple gastric ulcers    Thyroid disease    Wears dentures    partial upper and lower    Past Surgical History:  Procedure Laterality Date   ANKLE SURGERY     CARPAL TUNNEL RELEASE     x3   COLONOSCOPY  2000?   COLONOSCOPY WITH PROPOFOL N/A 10/22/2017   Procedure: COLONOSCOPY WITH PROPOFOL;  Surgeon: Lucilla Lame, MD;  Location: [facility];  Service: Endoscopy;  Laterality: N/A;   ESOPHAGOGASTRODUODENOSCOPY (EGD) WITH PROPOFOL N/A 10/22/2017   Procedure: ESOPHAGOGASTRODUODENOSCOPY (EGD) WITH PROPOFOL;  Surgeon: Lucilla Lame, MD;  Location: [place];  Service: Endoscopy;  Laterality: N/A;   FUSION OF TALONAVICULAR JOINT Right 08/03/2015   Procedure: TAILOR NAVICULAR JOINT FUSION - RIGHT ;  Surgeon: [name], DPM;  Location: [facility];  Service: Podiatry;  Laterality: Right;   JOINT REPLACEMENT Right    knee x 3 ,right x1 and left x2   POLYPECTOMY  N/A 10/22/2017   Procedure: POLYPECTOMY;  Surgeon: Lucilla Lame, MD;  Location: [facility];  Service: Endoscopy;  Laterality: N/A;   ROTATOR CUFF REPAIR     TONSILLECTOMY     TOTAL KNEE REVISION Right 01/20/2018   Procedure: POLYETHYLENE EXCHANGE;  Surgeon: Dereck Leep, MD;  Location: [facility];  Service: Orthopedics;  Laterality: Right;   TUBAL LIGATION     UPPER GI ENDOSCOPY  2000?    Family History  Problem Relation Age of Onset   Heart failure Mother    Gout Mother    Arthritis Mother    Hypertension Mother    Stroke Mother    Heart failure Father    Diabetes Father    Hyperlipidemia Father    COPD Sister     Cancer Maternal Aunt        breast   Cancer Maternal Uncle        kidney   Breast cancer Neg Hx     Social History:  reports that she quit smoking about 27 years ago. Her smoking use included cigarettes. She has a 40.00 pack-year smoking history. She has never used smokeless tobacco. She reports that she does not drink alcohol and does not use drugs. She denies current alcohol or tobacco use. She quit smoking remotely. She denies exposure to radiation or toxins. She retired at age 66. 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 The patient is alone today.  Allergies:  Allergies  Allergen Reactions   Ampicillin Swelling   Penicillins Anaphylaxis and Swelling    IgE = 10 (11/05/2017)  Has patient had a PCN reaction causing immediate rash, facial/tongue/throat swelling, SOB or lightheadedness with hypotension: Yes Has patient had a PCN reaction causing severe rash involving mucus membranes or skin necrosis: No Has patient had a PCN reaction that required hospitalization: No Has patient had a PCN reaction occurring within the last 10 years: Yes If all of the above answers are "NO", then may proceed with Cephalosporin use.   Vancomycin     Other reaction(s): Other (see comments)   Vibramycin [Doxycycline Calcium] Rash    Current Medications: Current Outpatient Medications  Medication Sig Dispense Refill   albuterol (VENTOLIN HFA) 108 (90 Base) MCG/ACT inhaler INHALE 2 PUFFS INTO THE LUNGS EVERY 4 HOURS AS NEEDED FOR WHEEZING 18 g 0   allopurinol (ZYLOPRIM) 100 MG tablet Take 200 mg by mouth every morning.      allopurinol (ZYLOPRIM) 100 MG tablet Take 2 tablets by mouth daily.     Apremilast (OTEZLA PO) Take by mouth.     aspirin EC 81 MG tablet Take 81 mg by mouth daily. Swallow whole.     betamethasone dipropionate 0.05 % cream APPLY TO PSORIASIS ON HANDS TWICE DAILY ONLY. AVOID FACE, GROIN AND AXILLA (Patient taking differently: as needed.) 45 g 0   betamethasone valerate (VALISONE) 0.1 %  cream Apply topically 2 (two) times daily as needed (Rash). 45 g 3   calcipotriene (DOVONOX) 0.005 % cream Apply to psoriasis areas on legs and feet daily once or twice a day 60 g 0   Cholecalciferol (VITAMIN D3) 2000 units TABS Take 2,000 Units by mouth daily.     diclofenac sodium (VOLTAREN) 1 % GEL Apply 2 g topically 3 (three) times daily.      ferrous sulfate 325 (65 FE) MG tablet Take 325 mg by mouth daily with breakfast.      gabapentin (NEURONTIN) 600 MG tablet Take 1 tablet (600 mg  total) by mouth at bedtime. 30 tablet 5   hydrALAZINE (APRESOLINE) 50 MG tablet TAKE 1 TABLET(50 MG) BY MOUTH TWICE DAILY 180 tablet 1   HYDROcodone-acetaminophen (NORCO) 7.5-325 MG tablet Take 1 tablet by mouth every 8 (eight) hours as needed for severe pain. Must last 30 days. 90 tablet 0   [START ON 01/24/2021] HYDROcodone-acetaminophen (NORCO) 7.5-325 MG tablet Take 1 tablet by mouth every 8 (eight) hours as needed for severe pain. Must last 30 days. 90 tablet 0   ketoconazole (NIZORAL) 2 % cream daily as needed.      levothyroxine (SYNTHROID) 175 MCG tablet TAKE 1 TABLET BY MOUTH EVERY DAY 90 tablet 1   losartan (COZAAR) 25 MG tablet Take 1 tablet (25 mg total) by mouth every morning. 90 tablet 1   meclizine (ANTIVERT) 12.5 MG tablet TAKE 1 TABLET(12.5 MG) BY MOUTH THREE TIMES DAILY AS NEEDED FOR DIZZINESS 30 tablet 0   methotrexate (RHEUMATREX) 2.5 MG tablet Take 20 mg by mouth every Friday. Take 5 tablets by mouth once weekly     montelukast (SINGULAIR) 10 MG tablet TAKE 1 TABLET(10 MG) BY MOUTH AT BEDTIME 90 tablet 1   Multiple Vitamins-Calcium (ONE-A-DAY WOMENS FORMULA PO) Take 1 tablet by mouth daily.     mupirocin ointment (BACTROBAN) 2 % Apply 1 application topically 2 (two) times daily. 22 g 0   omega-3 acid ethyl esters (LOVAZA) 1 g capsule TAKE 1 CAPSULE BY MOUTH TWICE DAILY 180 capsule 1   omeprazole (PRILOSEC) 40 MG capsule TAKE 1 CAPSULE(40 MG) BY MOUTH DAILY 90 capsule 1   OTEZLA 30 MG TABS  Take 1 tablet by mouth daily as needed.     Polyethyl Glycol-Propyl Glycol (SYSTANE OP) Place 1 drop into both eyes daily as needed (dry eyes).      potassium chloride SA (KLOR-CON) 20 MEQ tablet Take 0.5 tablets (10 mEq total) by mouth daily. 45 tablet 1   tiZANidine (ZANAFLEX) 4 MG tablet Take 1 tablet (4 mg total) by mouth at bedtime. 60 tablet 2   triamcinolone (KENALOG) 0.1 % Apply 1 application topically 2 (two) times daily. 30 g 0   triamterene-hydrochlorothiazide (MAXZIDE) 75-50 MG tablet TAKE 1 TABLET BY MOUTH DAILY 90 tablet 1   HYDROcodone-acetaminophen (NORCO) 7.5-325 MG tablet Take 1 tablet by mouth every 8 (eight) hours as needed for severe pain. Must last 30 days. 90 tablet 0   No current facility-administered medications for this visit.    Review of Systems  Constitutional:  Negative for chills, diaphoresis, fever, malaise/fatigue and weight loss (up 7 lbs).  HENT:  Negative for congestion, ear discharge, ear pain, hearing loss, nosebleeds, sinus pain, sore throat and tinnitus.   Eyes:        Glaucoma  Respiratory:  Positive for shortness of breath (on exertion, due to asthma). Negative for cough, hemoptysis and sputum production.   Cardiovascular:  Positive for leg swelling (right ankle). Negative for chest pain and palpitations.  Gastrointestinal:  Negative for abdominal pain, blood in stool, constipation, diarrhea, heartburn, melena, nausea and vomiting.  Genitourinary:  Negative for dysuria, frequency, hematuria and urgency.  Musculoskeletal:  Positive for joint pain (arthritis). Negative for back pain, myalgias and neck pain.  Skin:  Negative for itching and rash.  Neurological:  Positive for sensory change (foot numbness s/p surgery). Negative for dizziness, tingling, weakness and headaches.  Endo/Heme/Allergies:  Does not bruise/bleed easily.  Psychiatric/Behavioral:  Negative for depression and memory loss. The patient is not nervous/anxious and does not have  insomnia.    All other systems reviewed and are negative.  Performance status (ECOG): 1-2  Vitals Blood pressure 140/85, pulse (!) 109, temperature 98.7 F (37.1 C), temperature source Tympanic, resp. rate 18, weight 247 lb 14.4 oz (112.4 kg), SpO2 99 %.   Physical Exam Vitals and nursing note reviewed.  Constitutional:      General: She is not in acute distress.    Appearance: She is not diaphoretic.  Eyes:     General: No scleral icterus.    Conjunctiva/sclera: Conjunctivae normal.  Neurological:     Mental Status: She is alert and oriented to person, place, and time.  Psychiatric:        Behavior: Behavior normal.        Thought Content: Thought content normal.        Judgment: Judgment normal.   Appointment on 01/01/2021  Component Date Value Ref Range Status   WBC 01/01/2021 5.2  4.0 - 10.5 K/uL Final   RBC 01/01/2021 3.48 (A)  3.87 - 5.11 MIL/uL Final   Hemoglobin 01/01/2021 10.5 (A)  12.0 - 15.0 g/dL Final   HCT 01/01/2021 32.7 (A)  36.0 - 46.0 % Final   MCV 01/01/2021 94.0  80.0 - 100.0 fL Final   MCH 01/01/2021 30.2  26.0 - 34.0 pg Final   MCHC 01/01/2021 32.1  30.0 - 36.0 g/dL Final   RDW 01/01/2021 13.5  11.5 - 15.5 % Final   Platelets 01/01/2021 181  150 - 400 K/uL Final   nRBC 01/01/2021 0.0  0.0 - 0.2 % Final   Neutrophils Relative % 01/01/2021 66  % Final   Neutro Abs 01/01/2021 3.4  1.7 - 7.7 K/uL Final   Lymphocytes Relative 01/01/2021 23  % Final   Lymphs Abs 01/01/2021 1.2  0.7 - 4.0 K/uL Final   Monocytes Relative 01/01/2021 7  % Final   Monocytes Absolute 01/01/2021 0.4  0.1 - 1.0 K/uL Final   Eosinophils Relative 01/01/2021 3  % Final   Eosinophils Absolute 01/01/2021 0.2  0.0 - 0.5 K/uL Final   Basophils Relative 01/01/2021 1  % Final   Basophils Absolute 01/01/2021 0.0  0.0 - 0.1 K/uL Final   Immature Granulocytes 01/01/2021 0  % Final   Abs Immature Granulocytes 01/01/2021 0.01  0.00 - 0.07 K/uL Final   Performed at [facility], [address] [place], [place] [address]   Folate 01/01/2021 54.0  >5.9 ng/mL Final   Comment: RESULT CONFIRMED BY MANUAL DILUTION RH Performed at [facility], [address] [place], [place] [address]    Ferritin 01/01/2021 133  11 - 307 ng/mL Final   Performed at [facility], [address] [place], [place] [address]   Iron 01/01/2021 59  28 - 170 ug/dL Final   TIBC 01/01/2021 277  250 - 450 ug/dL Final   Saturation Ratios 01/01/2021 19  10.4 - 31.8 % Final   UIBC 01/01/2021 253  ug/dL Final   Performed at [facility], [address] [place], [place] [address]   WBC MORPHOLOGY 01/01/2021 HYPERSEGMENTED NEUT   Final   RBC MORPHOLOGY 01/01/2021 MORPHOLOGY UNREMARKABLE   Final   Tech Review 01/01/2021 GIANT PLATELETS SEEN   Final   Performed at [facility], [address] [place],  [address]    Assessment and Plan. 1. Anemia due to stage 3b chronic kidney disease (H[facility])   2. Stage 3b chronic kidney disease ([place])   3. Long  term methotrexate user     # Anemia in chronic kidney disease.  Labs are reviewed and are discussed with patient Hemoglobin slightly improved to 10.3 compared to last visit Iron panel showed ferritin of 133, iron saturation 19. Recommend goals of ferritin in the context of CKD to be 200. Recommend patient to proceed with IV Venofer 200 mg x 1. She may continue oral iron supplementation. No need for retacrit, as Hb >10  #Rheumatoid arthritis on methotrexate.  Continue folic acid 3 days/week.  Folate is normal.  #Chronic kidney disease, avoid nephrotoxin.  Encourage oral hydration.  Follow-up lab nurse practitioner 3 months +/- Venofer/ or retacrit  Earlie Server, MD, PhD Hematology Oncology  01/03/2021

## 2021-01-03 NOTE — Patient Instructions (Signed)
[facility]  Discharge Instructions: Thank you for choosing [facility] to provide your oncology and hematology care.  If you have a lab appointment with the Cancer Center, please go directly to the Cancer Center and check in at the registration area.  Wear comfortable clothing and clothing appropriate for easy access to any Portacath or PICC line.   We strive to give you quality time with your provider. You may need to reschedule your appointment if you arrive late (15 or more minutes).  Arriving late affects you and other patients whose appointments are after yours.  Also, if you miss three or more appointments without notifying the office, you may be dismissed from the clinic at the provider's discretion.      For prescription refill requests, have your pharmacy contact our office and allow 72 hours for refills to be completed.    Today you received the following chemotherapy and/or immunotherapy agents VENOFER      To help prevent nausea and vomiting after your treatment, we encourage you to take your nausea medication as directed.  BELOW ARE SYMPTOMS THAT SHOULD BE REPORTED IMMEDIATELY: *FEVER GREATER THAN 100.4 F (38 C) OR HIGHER *CHILLS OR SWEATING *NAUSEA AND VOMITING THAT IS NOT CONTROLLED WITH YOUR NAUSEA MEDICATION *UNUSUAL SHORTNESS OF BREATH *UNUSUAL BRUISING OR BLEEDING *URINARY PROBLEMS (pain or burning when urinating, or frequent urination) *BOWEL PROBLEMS (unusual diarrhea, constipation, pain near the anus) TENDERNESS IN MOUTH AND THROAT WITH OR WITHOUT PRESENCE OF ULCERS (sore throat, sores in mouth, or a toothache) UNUSUAL RASH, SWELLING OR PAIN  UNUSUAL VAGINAL DISCHARGE OR ITCHING   Items with * indicate a potential emergency and should be followed up as soon as possible or go to the Emergency Department if any problems should occur.  Please show the CHEMOTHERAPY ALERT CARD or IMMUNOTHERAPY ALERT CARD at check-in to  the Emergency Department and triage nurse.  Should you have questions after your visit or need to cancel or reschedule your appointment, please contact [facility]  [phone] and follow the prompts.  Office hours are 8:00 a.m. to 4:30 p.m. Monday - Friday. Please note that voicemails left after 4:00 p.m. may not be returned until the following business day.  We are closed weekends and major holidays. You have access to a nurse at all times for urgent questions. Please call the main number to the clinic [phone] and follow the prompts.  For any non-urgent questions, you may also contact your provider using MyChart. We now offer e-Visits for anyone 18 and older to request care online for non-urgent symptoms. For details visit [email or web address].   Also download the MyChart app! Go to the app store, search "MyChart", open the app, select [facility], and log in with your MyChart username and password.  Due to Covid, a mask is required upon entering the hospital/clinic. If you do not have a mask, one will be given to you upon arrival. For doctor visits, patients may have 1 support person aged 18 or older with them. For treatment visits, patients cannot have anyone with them due to current Covid guidelines and our immunocompromised population.   Iron Sucrose Injection What is this medication? IRON SUCROSE (EYE ern SOO krose) treats low levels of iron (iron deficiency anemia) in people with kidney disease. Iron is a mineral that plays an important role in making red blood cells, which carry oxygen from your lungs to the rest of your body. This medicine may   be used for other purposes; ask your health care provider or pharmacist if you have questions. COMMON BRAND NAME(S): Venofer What should I tell my care team before I take this medication? They need to know if you have any of these conditions: Anemia not caused by low iron levels Heart disease High levels  of iron in the blood Kidney disease Liver disease An unusual or allergic reaction to iron, other medications, foods, dyes, or preservatives Pregnant or trying to get pregnant Breast-feeding How should I use this medication? This medication is for infusion into a vein. It is given in a hospital or clinic setting. Talk to your care team about the use of this medication in children. While this medication may be prescribed for children as young as 2 years for selected conditions, precautions do apply. Overdosage: If you think you have taken too much of this medicine contact a poison control center or emergency room at once. NOTE: This medicine is only for you. Do not share this medicine with others. What if I miss a dose? It is important not to miss your dose. Call your care team if you are unable to keep an appointment. What may interact with this medication? Do not take this medication with any of the following: Deferoxamine Dimercaprol Other iron products This medication may also interact with the following: Chloramphenicol Deferasirox This list may not describe all possible interactions. Give your health care provider a list of all the medicines, herbs, non-prescription drugs, or dietary supplements you use. Also tell them if you smoke, drink alcohol, or use illegal drugs. Some items may interact with your medicine. What should I watch for while using this medication? Visit your care team regularly. Tell your care team if your symptoms do not start to get better or if they get worse. You may need blood work done while you are taking this medication. You may need to follow a special diet. Talk to your care team. Foods that contain iron include: whole grains/cereals, dried fruits, beans, or peas, leafy green vegetables, and organ meats (liver, kidney). What side effects may I notice from receiving this medication? Side effects that you should report to your care team as soon as  possible: Allergic reactions--skin rash, itching, hives, swelling of the face, lips, tongue, or throat Low blood pressure--dizziness, feeling faint or lightheaded, blurry vision Shortness of breath Side effects that usually do not require medical attention (report to your care team if they continue or are bothersome): Flushing Headache Joint pain Muscle pain Nausea Pain, redness, or irritation at injection site This list may not describe all possible side effects. Call your doctor for medical advice about side effects. You may report side effects to FDA at 1-800-FDA-1088. Where should I keep my medication? This medication is given in a hospital or clinic and will not be stored at home. NOTE: This sheet is a summary. It may not cover all possible information. If you have questions about this medicine, talk to your doctor, pharmacist, or health care provider.  2022 [employer] Standard (2020-07-06 00:00:00)  

## 2021-01-08 DIAGNOSIS — Z7689 Persons encountering health services in other specified circumstances: Secondary | ICD-10-CM | POA: Diagnosis not present

## 2021-01-08 DIAGNOSIS — Z79899 Other long term (current) drug therapy: Secondary | ICD-10-CM | POA: Diagnosis not present

## 2021-01-08 DIAGNOSIS — L400 Psoriasis vulgaris: Secondary | ICD-10-CM | POA: Diagnosis not present

## 2021-01-15 ENCOUNTER — Ambulatory Visit: Payer: Medicare Other

## 2021-01-28 ENCOUNTER — Other Ambulatory Visit: Payer: Self-pay | Admitting: Family Medicine

## 2021-01-28 DIAGNOSIS — E782 Mixed hyperlipidemia: Secondary | ICD-10-CM

## 2021-01-28 NOTE — Telephone Encounter (Signed)
Requested Prescriptions  Pending Prescriptions Disp Refills  . omega-3 acid ethyl esters (LOVAZA) 1 g capsule [Pharmacy Med Name: OMEGA-3-ACID 1GM CAPSULES (RX)] 180 capsule 1    Sig: TAKE 1 CAPSULE BY MOUTH TWICE DAILY     Endocrinology:  Nutritional Agents Passed - 01/28/2021  6:22 AM      Passed - Valid encounter within last 12 months    Recent Outpatient Visits          5 months ago [facility] Juline Patch, MD   7 months ago Hypertension, unspecified type   [facility] Juline Patch, MD   10 months ago Viral upper respiratory tract infection   [facility] Juline Patch, MD   1 year ago Hypertension, unspecified type   [facility] Juline Patch, MD   1 year ago Labyrinthitis of both ears   [facility] Juline Patch, MD

## 2021-01-29 NOTE — Telephone Encounter (Signed)
Receipt confirmed by pharmacy 01/28/21  At 12:57

## 2021-01-31 DIAGNOSIS — N2581 Secondary hyperparathyroidism of renal origin: Secondary | ICD-10-CM | POA: Diagnosis not present

## 2021-01-31 DIAGNOSIS — I10 Essential (primary) hypertension: Secondary | ICD-10-CM | POA: Diagnosis not present

## 2021-01-31 DIAGNOSIS — N1832 Chronic kidney disease, stage 3b: Secondary | ICD-10-CM | POA: Diagnosis not present

## 2021-01-31 DIAGNOSIS — D631 Anemia in chronic kidney disease: Secondary | ICD-10-CM | POA: Diagnosis not present

## 2021-02-07 DIAGNOSIS — Z20828 Contact with and (suspected) exposure to other viral communicable diseases: Secondary | ICD-10-CM | POA: Diagnosis not present

## 2021-02-12 ENCOUNTER — Ambulatory Visit
Payer: Medicare Other | Attending: Student in an Organized Health Care Education/Training Program | Admitting: Student in an Organized Health Care Education/Training Program

## 2021-02-12 ENCOUNTER — Encounter: Payer: Self-pay | Admitting: Student in an Organized Health Care Education/Training Program

## 2021-02-12 ENCOUNTER — Other Ambulatory Visit: Payer: Self-pay

## 2021-02-12 VITALS — BP 141/85 | HR 86 | Temp 97.2°F | Resp 18 | Ht 71.0 in | Wt 262.0 lb

## 2021-02-12 DIAGNOSIS — M7062 Trochanteric bursitis, left hip: Secondary | ICD-10-CM | POA: Insufficient documentation

## 2021-02-12 DIAGNOSIS — M47816 Spondylosis without myelopathy or radiculopathy, lumbar region: Secondary | ICD-10-CM | POA: Insufficient documentation

## 2021-02-12 DIAGNOSIS — M47818 Spondylosis without myelopathy or radiculopathy, sacral and sacrococcygeal region: Secondary | ICD-10-CM | POA: Insufficient documentation

## 2021-02-12 DIAGNOSIS — G894 Chronic pain syndrome: Secondary | ICD-10-CM | POA: Diagnosis not present

## 2021-02-12 DIAGNOSIS — M25562 Pain in left knee: Secondary | ICD-10-CM | POA: Insufficient documentation

## 2021-02-12 DIAGNOSIS — G5702 Lesion of sciatic nerve, left lower limb: Secondary | ICD-10-CM | POA: Diagnosis not present

## 2021-02-12 DIAGNOSIS — M2352 Chronic instability of knee, left knee: Secondary | ICD-10-CM | POA: Insufficient documentation

## 2021-02-12 DIAGNOSIS — M19011 Primary osteoarthritis, right shoulder: Secondary | ICD-10-CM | POA: Diagnosis not present

## 2021-02-12 DIAGNOSIS — G8929 Other chronic pain: Secondary | ICD-10-CM

## 2021-02-12 DIAGNOSIS — Z96652 Presence of left artificial knee joint: Secondary | ICD-10-CM | POA: Insufficient documentation

## 2021-02-12 DIAGNOSIS — M47812 Spondylosis without myelopathy or radiculopathy, cervical region: Secondary | ICD-10-CM | POA: Insufficient documentation

## 2021-02-12 MED ORDER — HYDROCODONE-ACETAMINOPHEN 7.5-325 MG PO TABS: 1.0000 | ORAL_TABLET | Freq: Three times a day (TID) | ORAL | 0 refills | Status: DC | PRN

## 2021-02-12 MED ORDER — TIZANIDINE HCL 4 MG PO TABS: 4.0000 mg | ORAL_TABLET | Freq: Every day | ORAL | 2 refills | Status: DC

## 2021-02-12 MED ORDER — GABAPENTIN 600 MG PO TABS: 600.0000 mg | ORAL_TABLET | Freq: Every day | ORAL | 5 refills | Status: DC

## 2021-02-12 NOTE — Progress Notes (Signed)
PROVIDER NOTE: Information contained herein reflects review and annotations entered in association with encounter. Interpretation of such information and data should be left to medically-trained personnel. Information provided to patient can be located elsewhere in the medical record under "Patient Instructions". Document created using STT-dictation technology, any transcriptional errors that may result from process are unintentional.    Patient: Michele Moore  Service Category: E/M  Provider: Gillis Santa, MD  DOB: 17-Jan-1951  DOS: 02/12/2021  Specialty: Interventional Pain Management  MRN: [id number]  Setting: Ambulatory outpatient  PCP: Juline Patch, MD  Type: Established Patient    Referring Provider: Juline Patch, MD  Location: Office  Delivery: Face-to-face     HPI  Michele Moore, a 70 y.o. year old female, is here today because of her Chronic knee pain after total replacement of left knee joint [M25.562, G89.29, Z96.652]. [name] primary complain today is Back Pain (lower)  Last encounter: My last encounter with her was on 12/24/20  Pertinent problems: [name] has Chronic knee pain after total replacement of left knee joint; Lumbar radiculopathy; Lumbar degenerative disc disease; Chronic pain syndrome; Spinal stenosis, lumbar region, with neurogenic claudication; Cervical facet syndrome; Primary osteoarthritis of right shoulder; Sprain of anterior talofibular ligament of right ankle; Severe obesity (BMI 35.0-35.9 with comorbidity) ([place]); CKD (chronic kidney disease), stage III ([place]); Primary osteoarthritis of left hip; and Lumbar spondylosis on their pertinent problem list. Pain Assessment: Severity of Chronic pain is reported as a 10-Worst pain ever/10. Location: Back Lower/both legs. Onset: More than a month ago. Quality: Stabbing, Sharp. Timing: Constant. Modifying factor(s): medications. Vitals:  height is '5\' 11"'  (1.803 m) and weight is 262 lb (118.8 kg). Her temporal temperature  is 97.2 F (36.2 C) (abnormal). Her blood pressure is 141/85 (abnormal) and her pulse is 86. Her respiration is 18 and oxygen saturation is 99%.   Reason for encounter:   [name] presents today for medication management as well as worsening left knee pain (hx of knee replacement and revision )  She has her left knee elevated today.  She has been told that she needs another left knee revision surgery.  Discussed left knee genicular nerve block and possible radiofrequency ablation. She is due for a refill of her chronic analgesic regimen as below.  UDS up-to-date and appropriate.  Pharmacotherapy Assessment  Analgesic: Norco 7.5 mg TID PRN #90/month, 22.5    Monitoring: [place] PMP: PDMP reviewed during this encounter.       Pharmacotherapy: No side-effects or adverse reactions reported. Compliance: No problems identified. Effectiveness: Clinically acceptable.  Landis Martins, RN  02/12/2021  8:24 AM  Sign when Signing Visit Nursing Pain Medication Assessment:  Safety precautions to be maintained throughout the outpatient stay will include: orient to surroundings, keep bed in low position, maintain call bell within reach at all times, provide assistance with transfer out of bed and ambulation.  Medication Inspection Compliance: Pill count conducted under aseptic conditions, in front of the patient. Neither the pills nor the bottle was removed from the patient's sight at any time. Once count was completed pills were immediately returned to the patient in their original bottle.  Medication: Hydrocodone/APAP Pill/Patch Count:  25 of 90 pills remain Pill/Patch Appearance: Markings consistent with prescribed medication Bottle Appearance: Standard pharmacy container. Clearly labeled. Filled Date: 35 / 2 / 2022 Last Medication intake:  Today   UDS:  Summary  Date Value Ref Range Status  06/14/2020 Note  Final    Comment:     ====================================================================  [employer] [facility] 13 (MW) ==================================================================== Test                             Result       Flag       Units  Drug Present and Declared for Prescription Verification   Hydrocodone                    1019         EXPECTED   ng/mg creat   Hydromorphone                  166          EXPECTED   ng/mg creat   Dihydrocodeine                 71           EXPECTED   ng/mg creat   Norhydrocodone                 536          EXPECTED   ng/mg creat    Sources of hydrocodone include scheduled prescription medications.    Hydromorphone, dihydrocodeine and norhydrocodone are expected    metabolites of hydrocodone. Hydromorphone and dihydrocodeine are    also available as scheduled prescription medications.  ==================================================================== Test                      Result    Flag   Units      Ref Range   Creatinine              166              mg/dL      >=20 ==================================================================== Declared Medications:  The flagging and interpretation on this report are based on the  following declared medications.  Unexpected results may arise from  inaccuracies in the declared medications.   **Note: The testing scope of this panel includes these medications:   Hydrocodone (Norco)   **Note: The testing scope of this panel does not include the  following reported medications:   Acetaminophen (Norco)  Albuterol (Ventolin HFA)  Allopurinol (Zyloprim)  Aspirin  Betamethasone  Fluticasone (Flonase)  Gabapentin (Neurontin)  Hydralazine (Apresoline)  Hydrochlorothiazide (Maxzide)  Iron  Ketoconazole (Nizoral)  Levothyroxine (Synthroid)  Losartan (Cozaar)  Meclizine (Antivert)  Methotrexate  Metronidazole (Flagyl)  Mometasone (Nasonex)  Montelukast (Singulair)  Multivitamin  Mupirocin (Bactroban)  Omega-3  Fatty Acids  Omeprazole (Prilosec)  Polyethylene Glycol  Potassium (Klor-Con)  Tizanidine (Zanaflex)  Topical Diclofenac (Voltaren)  Triamcinolone (Kenalog)  Triamterene (Maxzide)  Vitamin D3 ==================================================================== For clinical consultation, please call [phone]. ====================================================================      ROS  Constitutional: Denies any fever or chills Gastrointestinal: No reported hemesis, hematochezia, vomiting, or acute GI distress Musculoskeletal:  Left knee pain Neurological: No reported episodes of acute onset apraxia, aphasia, dysarthria, agnosia, amnesia, paralysis, loss of coordination, or loss of consciousness  Medication Review  Apremilast, HYDROcodone-acetaminophen, Multiple Vitamins-Calcium, Polyethyl Glycol-Propyl Glycol, Vitamin D3, albuterol, allopurinol, aspirin EC, betamethasone dipropionate, betamethasone valerate, calcipotriene, diclofenac sodium, ferrous sulfate, fluticasone, gabapentin, hydrALAZINE, ketoconazole, levothyroxine, losartan, meclizine, methotrexate, mometasone, montelukast, mupirocin ointment, omega-3 acid ethyl esters, omeprazole, potassium chloride SA, tiZANidine, triamcinolone cream, and triamterene-hydrochlorothiazide  History Review  Allergy: Ms. Morais is allergic to ampicillin, penicillins, vancomycin, and vibramycin [doxycycline calcium]. Drug: Ms. Ventrella  reports no history of drug use. Alcohol:  reports  no history of alcohol use. Tobacco:  reports that she quit smoking about 27 years ago. Her smoking use included cigarettes. She has a 40.00 pack-year smoking history. She has never used smokeless tobacco. Social: Ms. Fukuhara  reports that she quit smoking about 27 years ago. Her smoking use included cigarettes. She has a 40.00 pack-year smoking history. She has never used smokeless tobacco. She reports that she does not drink alcohol and does not use drugs. Medical:   has a past medical history of Allergy, Anemia, Arthritis, Asthma, Cataract, Chronic kidney disease, Collagen vascular disease ([place]), Complication of anesthesia, Dyspnea, Dysrhythmia, Family history of adverse reaction to anesthesia, GERD (gastroesophageal reflux disease), Glaucoma, H/O tooth extraction, Hemorrhoid, History of hiatal hernia, History of kidney stones, Hypertension, Hypothyroidism, Migraines, Mixed hyperlipidemia, Multiple gastric ulcers, Thyroid disease, and Wears dentures. Surgical: Ms. Cancro  has a past surgical history that includes Carpal tunnel release; Rotator cuff repair; Ankle surgery; Tubal ligation; Colonoscopy (2000?); Upper gi endoscopy (2000?); Tonsillectomy; Fusion of talonavicular joint (Right, 08/03/2015); Colonoscopy with propofol (N/A, 10/22/2017); Esophagogastroduodenoscopy (egd) with propofol (N/A, 10/22/2017); polypectomy (N/A, 10/22/2017); Total knee revision (Right, 01/20/2018); and Joint replacement (Right). Family: family history includes Arthritis in her mother; COPD in her sister; Cancer in her maternal aunt and maternal uncle; Diabetes in her father; Gout in her mother; Heart failure in her father and mother; Hyperlipidemia in her father; Hypertension in her mother; Stroke in her mother.  Laboratory Chemistry Profile   Renal Lab Results  Component Value Date   BUN 30 (H) 06/12/2020   CREATININE 1.64 (H) 06/12/2020   BCR 18 06/12/2020   GFRAA 30 (L) 08/28/2019   GFRNONAA 30 (L) 12/07/2019    Hepatic Lab Results  Component Value Date   AST 25 06/12/2020   ALT 16 06/12/2020   ALBUMIN 4.4 06/12/2020   ALKPHOS 98 06/12/2020    Electrolytes Lab Results  Component Value Date   NA 144 06/12/2020   K 4.3 06/12/2020   CL 106 06/12/2020   CALCIUM 9.9 06/12/2020   MG 2.0 10/01/2018   PHOS 4.0 04/12/2019    Bone No results found for: VD25OH, VD125OH2TOT, ZY6063KZ6, WF0932TF5, 25OHVITD1, 25OHVITD2, 25OHVITD3, TESTOFREE, TESTOSTERONE  Inflammation (CRP: Acute  Phase) (ESR: Chronic Phase) Lab Results  Component Value Date   CRP 0.9 04/23/2020   ESRSEDRATE 45 (H) 08/30/2020          Note: Above Lab results reviewed.   Physical Exam  General appearance: Well nourished, well developed, and well hydrated. In no apparent acute distress Mental status: Alert, oriented x 3 (person, place, & time)       Respiratory: No evidence of acute respiratory distress Eyes: PERLA Vitals: BP (!) 141/85    Pulse 86    Temp (!) 97.2 F (36.2 C) (Temporal)    Resp 18    Ht '5\' 11"'  (1.803 m)    Wt 262 lb (118.8 kg)    SpO2 99%    BMI 36.54 kg/m  BMI: Estimated body mass index is 36.54 kg/m as calculated from the following:   Height as of this encounter: '5\' 11"'  (1.803 m).   Weight as of this encounter: 262 lb (118.8 kg). Ideal: Ideal body weight: 70.8 kg (156 lb 1.4 oz) Adjusted ideal body weight: 90 kg (198 lb 7.2 oz)    Cervical Spine Area Exam  Skin & Axial Inspection: No masses, redness, edema, swelling, or associated skin lesions Alignment: Symmetrical Functional ROM: Diminished ROM      Stability: No instability  detected Muscle Tone/Strength: Functionally intact. No obvious neuro-muscular anomalies detected. Sensory (Neurological): Articular pain pattern and musculoskeletal Palpation: Complains of area being tender to palpation                     Upper Extremity (UE) Exam      Side: Right upper extremity   Side: Left upper extremity   Skin & Extremity Inspection: Edema of right wrist   Skin & Extremity Inspection: Skin color, temperature, and hair growth are WNL. No peripheral edema or cyanosis. No masses, redness, swelling, asymmetry, or associated skin lesions. No contractures.   Functional ROM: Decreased ROM for shoulder and elbow   Functional ROM: Unrestricted ROM           Muscle Tone/Strength: Functionally intact. No obvious neuro-muscular anomalies detected.   Muscle Tone/Strength: Functionally intact. No obvious neuro-muscular anomalies  detected.   Sensory (Neurological): Musculoskeletal pain pattern           Sensory (Neurological): Unimpaired           Palpation: No palpable anomalies               Palpation: No palpable anomalies               Provocative Test(s):  Phalen's test: deferred Tinel's test: deferred Apley's scratch test (touch opposite shoulder):  Action 1 (Across chest): Decreased ROM Action 2 (Overhead): Decreased ROM Action 3 (LB reach): Decreased ROM     Provocative Test(s):  Phalen's test: deferred Tinel's test: deferred Apley's scratch test (touch opposite shoulder):  Action 1 (Across chest): Decreased ROM Action 2 (Overhead): Decreased ROM Action 3 (LB reach): Decreased ROM       Thoracic Spine Area Exam  Skin & Axial Inspection: No masses, redness, or swelling Alignment: Symmetrical Functional ROM: Unrestricted ROM Stability: No instability detected Muscle Tone/Strength: Functionally intact. No obvious neuro-muscular anomalies detected. Sensory (Neurological): Unimpaired Muscle strength & Tone: No palpable anomalies   Lumbar Spine Area Exam  Skin & Axial Inspection: No masses, redness, or swelling Alignment: Symmetrical Functional ROM: Improved after treatment       Stability: No instability detected Muscle Tone/Strength: Functionally intact. No obvious neuro-muscular anomalies detected. Sensory (Neurological): Dermatomal pain pattern and referred SI joint  Gait & Posture Assessment  Ambulation: Limited Gait: Antalgic Posture: Difficulty standing up straight, due to pain    Lower Extremity Exam      Side: Right lower extremity   Side: Left lower extremity  Stability: No instability observed           Stability: No instability observed          Skin & Extremity Inspection: Skin color, temperature, and hair growth are WNL. No peripheral edema or cyanosis. No masses, redness, swelling, asymmetry, or associated skin lesions. No contractures.   Skin & Extremity Inspection: Skin color,  temperature, and hair growth are WNL. No peripheral edema or cyanosis. No masses, redness, swelling, asymmetry, or associated skin lesions. No contractures.  Functional ROM: Unrestricted ROM                   Functional ROM: Decreased ROM for hip and knee joints          Muscle Tone/Strength: Functionally intact. No obvious neuro-muscular anomalies detected.   Muscle Tone/Strength: Functionally intact. No obvious neuro-muscular anomalies detected.  Sensory (Neurological): Unimpaired   Sensory (Neurological): Dermatomal pain pattern and arthropathic of left knee  Palpation: No palpable anomalies   Palpation: No palpable  anomalies    Assessment   Status Diagnosis  Having a Flare-up Having a Flare-up Having a Flare-up 1. 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 Chronic instability of left knee   3. Chronic pain syndrome   4. SI joint arthritis   5. Piriformis syndrome of left side   6. Trochanteric bursitis of left hip   7. Lumbar spondylosis   8. Cervical facet syndrome   9. Primary osteoarthritis of right shoulder      Updated Problems: Problem  Chronic Knee Pain After Total Replacement of Left Knee Joint  Chronic Instability of Left Knee     Plan of Care    [name] has a current medication list which includes the following long-term medication(s): albuterol, allopurinol, allopurinol, ferrous sulfate, hydralazine, levothyroxine, losartan, montelukast, omega-3 acid ethyl esters, omeprazole, potassium chloride sa, triamterene-hydrochlorothiazide, gabapentin, [START ON 02/23/2021] hydrocodone-acetaminophen, [START ON 03/25/2021] hydrocodone-acetaminophen, [START ON 04/24/2021] hydrocodone-acetaminophen, [DISCONTINUED] fluticasone, and [DISCONTINUED] mometasone.  1. Chronic knee pain after total replacement of left knee joint - GENICULAR NERVE BLOCK; Future  2. Chronic instability of left knee - GENICULAR NERVE BLOCK; Future  3. Chronic pain  syndrome - HYDROcodone-acetaminophen (NORCO) 7.5-325 MG tablet; Take 1 tablet by mouth every 8 (eight) hours as needed for severe pain. Must last 30 days.  Dispense: 90 tablet; Refill: 0 - HYDROcodone-acetaminophen (NORCO) 7.5-325 MG tablet; Take 1 tablet by mouth every 8 (eight) hours as needed for severe pain. Must last 30 days.  Dispense: 90 tablet; Refill: 0 - HYDROcodone-acetaminophen (NORCO) 7.5-325 MG tablet; Take 1 tablet by mouth every 8 (eight) hours as needed for severe pain. Must last 30 days.  Dispense: 90 tablet; Refill: 0 - GENICULAR NERVE BLOCK; Future - gabapentin (NEURONTIN) 600 MG tablet; Take 1 tablet (600 mg total) by mouth at bedtime.  Dispense: 30 tablet; Refill: 5 - tiZANidine (ZANAFLEX) 4 MG tablet; Take 1 tablet (4 mg total) by mouth at bedtime.  Dispense: 60 tablet; Refill: 2  4. SI joint arthritis  5. Piriformis syndrome of left side  6. Trochanteric bursitis of left hip  7. Lumbar spondylosis - HYDROcodone-acetaminophen (NORCO) 7.5-325 MG tablet; Take 1 tablet by mouth every 8 (eight) hours as needed for severe pain. Must last 30 days.  Dispense: 90 tablet; Refill: 0 - HYDROcodone-acetaminophen (NORCO) 7.5-325 MG tablet; Take 1 tablet by mouth every 8 (eight) hours as needed for severe pain. Must last 30 days.  Dispense: 90 tablet; Refill: 0 - HYDROcodone-acetaminophen (NORCO) 7.5-325 MG tablet; Take 1 tablet by mouth every 8 (eight) hours as needed for severe pain. Must last 30 days.  Dispense: 90 tablet; Refill: 0 - gabapentin (NEURONTIN) 600 MG tablet; Take 1 tablet (600 mg total) by mouth at bedtime.  Dispense: 30 tablet; Refill: 5 - tiZANidine (ZANAFLEX) 4 MG tablet; Take 1 tablet (4 mg total) by mouth at bedtime.  Dispense: 60 tablet; Refill: 2  8. Cervical facet syndrome  9. Primary osteoarthritis of right shoulder     Pharmacotherapy (Medications Ordered): Meds ordered this encounter  Medications   HYDROcodone-acetaminophen (NORCO) 7.5-325 MG tablet     Sig: Take 1 tablet by mouth every 8 (eight) hours as needed for severe pain. Must last 30 days.    Dispense:  90 tablet    Refill:  0    Chronic Pain. (STOP Act - Not applicable). Fill one day early if closed on scheduled refill date.   HYDROcodone-acetaminophen (NORCO) 7.5-325 MG tablet  Sig: Take 1 tablet by mouth every 8 (eight) hours as needed for severe pain. Must last 30 days.    Dispense:  90 tablet    Refill:  0    Chronic Pain. (STOP Act - Not applicable). Fill one day early if closed on scheduled refill date.   HYDROcodone-acetaminophen (NORCO) 7.5-325 MG tablet    Sig: Take 1 tablet by mouth every 8 (eight) hours as needed for severe pain. Must last 30 days.    Dispense:  90 tablet    Refill:  0    Chronic Pain. (STOP Act - Not applicable). Fill one day early if closed on scheduled refill date.   gabapentin (NEURONTIN) 600 MG tablet    Sig: Take 1 tablet (600 mg total) by mouth at bedtime.    Dispense:  30 tablet    Refill:  5   tiZANidine (ZANAFLEX) 4 MG tablet    Sig: Take 1 tablet (4 mg total) by mouth at bedtime.    Dispense:  60 tablet    Refill:  2   Orders:  Orders Placed This Encounter  Procedures   GENICULAR NERVE BLOCK    Indication(s):  Sub-acute knee pain    Standing Status:   Future    Standing Expiration Date:   05/13/2021    Scheduling Instructions:     Side:LEFT     Sedation:without     Timeframe: As soon as schedule allows    Order Specific Question:   Where will this procedure be performed?    Answer:   [facility]   Follow-up plan:   Return in about 15 days (around 02/27/2021) for left GN block.    Recent Visits Date Type Provider Dept  12/24/20 Office Visit Gillis Santa, MD [facility]  11/26/20 Procedure visit Gillis Santa, MD [facility]  11/15/20 Office Visit Gillis Santa, MD [facility]  Showing recent visits within past 90 days and meeting all other requirements Today's Visits Date Type  Provider Dept  02/12/21 Office Visit Gillis Santa, MD [facility]  Showing today's visits and meeting all other requirements Future Appointments Date Type Provider Dept  02/27/21 Appointment Gillis Santa, MD [facility]  Showing future appointments within next 90 days and meeting all other requirements  I discussed the assessment and treatment plan with the patient. The patient was provided an opportunity to ask questions and all were answered. The patient agreed with the plan and demonstrated an understanding of the instructions.  Patient advised to call back or seek an in-person evaluation if the symptoms or condition worsens.  Duration of encounter: 15mnutes.  Note by: BGillis Santa MD Date: 02/12/2021; Time: 8:43 AM

## 2021-02-12 NOTE — Patient Instructions (Signed)

## 2021-02-12 NOTE — Progress Notes (Signed)
Nursing Pain Medication Assessment:  Safety precautions to be maintained throughout the outpatient stay will include: orient to surroundings, keep bed in low position, maintain call bell within reach at all times, provide assistance with transfer out of bed and ambulation.  Medication Inspection Compliance: Pill count conducted under aseptic conditions, in front of the patient. Neither the pills nor the bottle was removed from the patient's sight at any time. Once count was completed pills were immediately returned to the patient in their original bottle.  Medication: Hydrocodone/APAP Pill/Patch Count:  25 of 90 pills remain Pill/Patch Appearance: Markings consistent with prescribed medication Bottle Appearance: Standard pharmacy container. Clearly labeled. Filled Date: 46 / 2 / 2022 Last Medication intake:  Today

## 2021-02-21 DIAGNOSIS — Z96652 Presence of left artificial knee joint: Secondary | ICD-10-CM | POA: Diagnosis not present

## 2021-02-27 ENCOUNTER — Other Ambulatory Visit: Payer: Self-pay

## 2021-02-27 ENCOUNTER — Encounter: Payer: Self-pay | Admitting: Student in an Organized Health Care Education/Training Program

## 2021-02-27 ENCOUNTER — Ambulatory Visit
Admission: RE | Admit: 2021-02-27 | Discharge: 2021-02-27 | Disposition: A | Discharge status: Home / Self Care | Payer: Medicare Other | Source: Ambulatory Visit | Attending: Student in an Organized Health Care Education/Training Program | Admitting: Student in an Organized Health Care Education/Training Program

## 2021-02-27 ENCOUNTER — Ambulatory Visit (HOSPITAL_BASED_OUTPATIENT_CLINIC_OR_DEPARTMENT_OTHER): Payer: Medicare Other | Admitting: Student in an Organized Health Care Education/Training Program

## 2021-02-27 VITALS — BP 142/68 | HR 98 | Temp 97.4°F | Resp 14 | Ht 71.0 in | Wt 262.0 lb

## 2021-02-27 DIAGNOSIS — M2352 Chronic instability of knee, left knee: Secondary | ICD-10-CM | POA: Diagnosis not present

## 2021-02-27 DIAGNOSIS — M25562 Pain in left knee: Secondary | ICD-10-CM | POA: Insufficient documentation

## 2021-02-27 DIAGNOSIS — E876 Hypokalemia: Secondary | ICD-10-CM

## 2021-02-27 DIAGNOSIS — Z96652 Presence of left artificial knee joint: Secondary | ICD-10-CM

## 2021-02-27 DIAGNOSIS — G894 Chronic pain syndrome: Secondary | ICD-10-CM | POA: Insufficient documentation

## 2021-02-27 DIAGNOSIS — G8929 Other chronic pain: Secondary | ICD-10-CM

## 2021-02-27 MED ORDER — DIAZEPAM 5 MG PO TABS
ORAL_TABLET | ORAL | Status: AC
  Filled 2021-02-27: qty 1

## 2021-02-27 MED ORDER — DEXAMETHASONE SODIUM PHOSPHATE 10 MG/ML IJ SOLN
INTRAMUSCULAR | Status: AC
  Filled 2021-02-27: qty 1

## 2021-02-27 MED ORDER — LIDOCAINE HCL 2 % IJ SOLN
20.0000 mL | Freq: Once | INTRAMUSCULAR | Status: AC
  Administered 2021-02-27: 200 mg

## 2021-02-27 MED ORDER — ROPIVACAINE HCL 2 MG/ML IJ SOLN
INTRAMUSCULAR | Status: AC
  Filled 2021-02-27: qty 20

## 2021-02-27 MED ORDER — LIDOCAINE HCL 2 % IJ SOLN
INTRAMUSCULAR | Status: AC
  Filled 2021-02-27: qty 20

## 2021-02-27 MED ORDER — DEXAMETHASONE SODIUM PHOSPHATE 10 MG/ML IJ SOLN
10.0000 mg | Freq: Once | INTRAMUSCULAR | Status: AC
  Administered 2021-02-27: 10 mg

## 2021-02-27 MED ORDER — ROPIVACAINE HCL 2 MG/ML IJ SOLN
9.0000 mL | Freq: Once | INTRAMUSCULAR | Status: AC
  Administered 2021-02-27: 9 mL via PERINEURAL

## 2021-02-27 MED ORDER — DIAZEPAM 5 MG PO TABS
2.5000 mg | ORAL_TABLET | ORAL | Status: AC
  Administered 2021-02-27: 2.5 mg via ORAL

## 2021-02-27 MED ORDER — POTASSIUM CHLORIDE CRYS ER 20 MEQ PO TBCR: 10.0000 meq | EXTENDED_RELEASE_TABLET | Freq: Every day | ORAL | 0 refills | Status: DC

## 2021-02-27 NOTE — Progress Notes (Signed)
PROVIDER NOTE: Interpretation of information contained herein should be left to medically-trained personnel. Specific patient instructions are provided elsewhere under "Patient Instructions" section of medical record. This document was created in part using STT-dictation technology, any transcriptional errors that may result from this process are unintentional.  Patient: Michele Moore Type: Established DOB: 1951-01-26 MRN: [id number] PCP: Juline Patch, MD  Service: Procedure DOS: 02/27/2021 Setting: Ambulatory Location: Ambulatory outpatient facility Delivery: Face-to-face Provider: Gillis Santa, MD Specialty: Interventional Pain Management Specialty designation: 09 Location: Outpatient facility Ref. Prov.: Juline Patch, MD    Primary Reason for Visit: Interventional Pain Management Treatment. CC: Knee Pain (left), Back Pain (low), and Hip Pain    Procedure:          Anesthesia, Analgesia, Anxiolysis:  Type: Genicular Nerves Block (Superolateral, Superomedial, and Inferomedial Genicular Nerves) #1  CPT: 09323      Primary Purpose: Diagnostic Region: Lateral, Anterior, and Medial aspects of the knee joint, above and below the knee joint proper. Level: Superior and inferior to the knee joint. Target Area: For Genicular Nerve block(s), the targets are: the superolateral genicular nerve, located in the lateral distal portion of the femoral shaft as it curves to form the lateral epicondyle, in the region of the distal femoral metaphysis; the superomedial genicular nerve, located in the medial distal portion of the femoral shaft as it curves to form the medial epicondyle; and the inferomedial genicular nerve, located in the medial, proximal portion of the tibial shaft, as it curves to form the medial epicondyle, in the region of the proximal tibial metaphysis. Approach: Anterior, percutaneous, ipsilateral approach. Laterality: Left knee  Anesthesia: Local (1-2% Lidocaine)  Anxiolysis: Oral 2.5  mg PO Valium  Sedation: None  Guidance: Fluoroscopy           Position: Modified Fowler's position with pillows under the targeted knee(s).   1. 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 Chronic instability of left knee   3. Chronic pain syndrome    NAS-11 Pain score:   Pre-procedure: 8 /10   Post-procedure: 0-No pain/10     Pre-op H&P Assessment:  Michele Moore is a 71 y.o. (year old), female patient, seen today for interventional treatment. She  has a past surgical history that includes Carpal tunnel release; Rotator cuff repair; Ankle surgery; Tubal ligation; Colonoscopy (2000?); Upper gi endoscopy (2000?); Tonsillectomy; Fusion of talonavicular joint (Right, 08/03/2015); Colonoscopy with propofol (N/A, 10/22/2017); Esophagogastroduodenoscopy (egd) with propofol (N/A, 10/22/2017); polypectomy (N/A, 10/22/2017); Total knee revision (Right, 01/20/2018); and Joint replacement (Right). Michele Moore has a current medication list which includes the following prescription(s): albuterol, allopurinol, allopurinol, apremilast, aspirin ec, betamethasone dipropionate, betamethasone valerate, calcipotriene, vitamin d3, diclofenac sodium, ferrous sulfate, gabapentin, hydralazine, hydrocodone-acetaminophen, [START ON 03/25/2021] hydrocodone-acetaminophen, [START ON 04/24/2021] hydrocodone-acetaminophen, ketoconazole, levothyroxine, losartan, meclizine, methotrexate, montelukast, multiple vitamins-calcium, mupirocin ointment, omega-3 acid ethyl esters, omeprazole, otezla, polyethyl glycol-propyl glycol, potassium chloride sa, tizanidine, triamcinolone cream, triamterene-hydrochlorothiazide, [DISCONTINUED] fluticasone, and [DISCONTINUED] mometasone. Her primarily concern today is the Knee Pain (left), Back Pain (low), and Hip Pain  Initial Vital Signs:  Pulse/HCG Rate: 98ECG Heart Rate: 70 Temp: (!) 97.4 F (36.3 C) Resp: 15 BP: (!) 157/124 SpO2: 98 %  BMI: Estimated body mass index is 36.54  kg/m as calculated from the following:   Height as of this encounter: 5\' 11"  (1.803 m).   Weight as of this encounter: 262 lb (118.8 kg).  Risk Assessment: Allergies: Reviewed. She is allergic to ampicillin, penicillins, vancomycin,  and vibramycin [doxycycline calcium].  Allergy Precautions: None required Coagulopathies: Reviewed. None identified.  Blood-thinner therapy: None at this time Active Infection(s): Reviewed. None identified. Michele Moore is afebrile  Site Confirmation: Michele Moore was asked to confirm the procedure and laterality before marking the site Procedure checklist: Completed Consent: Before the procedure and under the influence of no sedative(s), amnesic(s), or anxiolytics, the patient was informed of the treatment options, risks and possible complications. To fulfill our ethical and legal obligations, as recommended by the American Medical Association's Code of Ethics, I have informed the patient of my clinical impression; the nature and purpose of the treatment or procedure; the risks, benefits, and possible complications of the intervention; the alternatives, including doing nothing; the risk(s) and benefit(s) of the alternative treatment(s) or procedure(s); and the risk(s) and benefit(s) of doing nothing. The patient was provided information about the general risks and possible complications associated with the procedure. These may include, but are not limited to: failure to achieve desired goals, infection, bleeding, organ or nerve damage, allergic reactions, paralysis, and death. In addition, the patient was informed of those risks and complications associated to the procedure, such as failure to decrease pain; infection; bleeding; organ or nerve damage with subsequent damage to sensory, motor, and/or autonomic systems, resulting in permanent pain, numbness, and/or weakness of one or several areas of the body; allergic reactions; (i.e.: anaphylactic reaction); and/or  death. Furthermore, the patient was informed of those risks and complications associated with the medications. These include, but are not limited to: allergic reactions (i.e.: anaphylactic or anaphylactoid reaction(s)); adrenal axis suppression; blood sugar elevation that in diabetics may result in ketoacidosis or comma; water retention that in patients with history of congestive heart failure may result in shortness of breath, pulmonary edema, and decompensation with resultant heart failure; weight gain; swelling or edema; medication-induced neural toxicity; particulate matter embolism and blood vessel occlusion with resultant organ, and/or nervous system infarction; and/or aseptic necrosis of one or more joints. Finally, the patient was informed that Medicine is not an exact science; therefore, there is also the possibility of unforeseen or unpredictable risks and/or possible complications that may result in a catastrophic outcome. The patient indicated having understood very clearly. We have given the patient no guarantees and we have made no promises. Enough time was given to the patient to ask questions, all of which were answered to the patient's satisfaction. Ms. Hargraves has indicated that she wanted to continue with the procedure. Attestation: I, the ordering provider, attest that I have discussed with the patient the benefits, risks, side-effects, alternatives, likelihood of achieving goals, and potential problems during recovery for the procedure that I have provided informed consent. Date   Time: 02/27/2021  9:54 AM  Pre-Procedure Preparation:  Monitoring: As per clinic protocol. Respiration, ETCO2, SpO2, BP, heart rate and rhythm monitor placed and checked for adequate function Safety Precautions: Patient was assessed for positional comfort and pressure points before starting the procedure. Time-out: I initiated and conducted the "Time-out" before starting the procedure, as per protocol. The patient  was asked to participate by confirming the accuracy of the "Time Out" information. Verification of the correct person, site, and procedure were performed and confirmed by me, the nursing staff, and the patient. "Time-out" conducted as per Joint Commission's Universal Protocol (UP.01.01.01). Time: 1023  Description of Procedure:          Area Prepped: Entire knee area, from mid-thigh to mid-shin, lateral, anterior, and medial aspects. DuraPrep (Iodine Povacrylex [0.7% available iodine]  and Isopropyl Alcohol, 74% w/w) Safety Precautions: Aspiration looking for blood return was conducted prior to all injections. At no point did we inject any substances, as a needle was being advanced. No attempts were made at seeking any paresthesias. Safe injection practices and needle disposal techniques used. Medications properly checked for expiration dates. SDV (single dose vial) medications used. Description of the Procedure: Protocol guidelines were followed. The patient was placed in position over the procedure table. The target area was identified and the area prepped in the usual manner. Skin & deeper tissues infiltrated with local anesthetic. Appropriate amount of time allowed to pass for local anesthetics to take effect. The procedure needles were then advanced to the target area. Proper needle placement secured. Negative aspiration confirmed. Solution injected in intermittent fashion, asking for systemic symptoms every 0.5cc of injectate. The needles were then removed and the area cleansed, making sure to leave some of the prepping solution back to take advantage of its long term bactericidal properties.      Vitals:   02/27/21 0957 02/27/21 0959 02/27/21 1022 02/27/21 1027  BP: (!) 157/124 (!) 148/93 138/61 (!) 142/68  Pulse: 98     Resp:   15 14  Temp: (!) 97.4 F (36.3 C)     SpO2: 98%  99% 99%  Weight: 262 lb (118.8 kg)     Height: 5\' 11"  (1.803 m)       Start Time: 1023 hrs. End Time: 1029  hrs. Materials:  Needle(s) Type: Spinal Needle Gauge: 22G Length: 3.5-in Medication(s): Please see orders for medications and dosing details. 9 cc solution made of 7cc of 0.2% ropivacaine, 2 cc of Decadron 10 mg/cc. 3 cc injected at each level above for the left genicular nerves  Imaging Guidance (Non-Spinal):          Type of Imaging Technique: Fluoroscopy Guidance (Non-Spinal) Indication(s): Assistance in needle guidance and placement for procedures requiring needle placement in or near specific anatomical locations not easily accessible without such assistance. Exposure Time: Please see nurses notes. Contrast: None used. Fluoroscopic Guidance: I was personally present during the use of fluoroscopy. "Tunnel Vision Technique" used to obtain the best possible view of the target area. Parallax error corrected before commencing the procedure. "Direction-depth-direction" technique used to introduce the needle under continuous pulsed fluoroscopy. Once target was reached, antero-posterior, oblique, and lateral fluoroscopic projection used confirm needle placement in all planes. Images permanently stored in EMR. Interpretation: No contrast injected. I personally interpreted the imaging intraoperatively. Adequate needle placement confirmed in multiple planes. Permanent images saved into the patient's record.   Post-operative Assessment:  Post-procedure Vital Signs:  Pulse/HCG Rate: 9872 Temp:  (!) 97.4 F (36.3 C) Resp: 14 BP:  (!) 142/68 SpO2: 99 %  EBL: None  Complications: No immediate post-treatment complications observed by team, or reported by patient.  Note: The patient tolerated the entire procedure well. A repeat set of vitals were taken after the procedure and the patient was kept under observation following institutional policy, for this type of procedure. Post-procedural neurological assessment was performed, showing return to baseline, prior to discharge. The patient was  provided with post-procedure discharge instructions, including a section on how to identify potential problems. Should any problems arise concerning this procedure, the patient was given instructions to immediately contact us, at any time, without hesitation. In any case, we plan to contact the patient by telephone for a follow-up status report regarding this interventional procedure.  Comments:  No additional relevant information.  Plan of  Care  Orders:  Orders Placed This Encounter  Procedures   DG PAIN CLINIC C-ARM 1-60 MIN NO REPORT    Intraoperative interpretation by procedural physician at [place].    Standing Status:   Standing    Number of Occurrences:   1    Order Specific Question:   Reason for exam:    Answer:   Assistance in needle guidance and placement for procedures requiring needle placement in or near specific anatomical locations not easily accessible without such assistance.   Chronic Opioid Analgesic:  Norco 7.5 mg TID PRN #90/month, 22.5    Medications ordered for procedure: Meds ordered this encounter  Medications   lidocaine (XYLOCAINE) 2 % (with pres) injection 400 mg   dexamethasone (DECADRON) injection 10 mg   ropivacaine (PF) 2 mg/mL (0.2%) (NAROPIN) injection 9 mL   dexamethasone (DECADRON) injection 10 mg   diazepam (VALIUM) tablet 2.5 mg    Make sure Flumazenil is available in the pyxis when using this medication. If oversedation occurs, administer 0.2 mg IV over 15 sec. If after 45 sec no response, administer 0.2 mg again over 1 min; may repeat at 1 min intervals; not to exceed 4 doses (1 mg)   Medications administered: We administered lidocaine, dexamethasone, ropivacaine (PF) 2 mg/mL (0.2%), dexamethasone, and diazepam.  See the medical record for exact dosing, route, and time of administration.  Follow-up plan:   Return in about 3 weeks (around 03/20/2021) for Post Procedure Evaluation, virtual.     Recent Visits Date Type Provider  Dept  02/12/21 Office Visit Gillis Santa, MD [facility]  12/24/20 Office Visit Gillis Santa, MD [facility]  Showing recent visits within past 90 days and meeting all other requirements Today's Visits Date Type Provider Dept  02/27/21 Procedure visit Gillis Santa, MD [facility]  Showing today's visits and meeting all other requirements Future Appointments Date Type Provider Dept  03/21/21 Appointment Gillis Santa, MD [facility]  05/14/21 Appointment Gillis Santa, MD [facility]  Showing future appointments within next 90 days and meeting all other requirements  Disposition: Discharge home  Discharge (Date   Time): 02/27/2021; 1038 hrs.   Primary Care Physician: Juline Patch, MD Location: [facility] Note by: Gillis Santa, MD Date: 02/27/2021; Time: 10:41 AM  Disclaimer:  Medicine is not an exact science. The only guarantee in medicine is that nothing is guaranteed. It is important to note that the decision to proceed with this intervention was based on the information collected from the patient. The Data and conclusions were drawn from the patient's questionnaire, the interview, and the physical examination. Because the information was provided in large part by the patient, it cannot be guaranteed that it has not been purposely or unconsciously manipulated. Every effort has been made to obtain as much relevant data as possible for this evaluation. It is important to note that the conclusions that lead to this procedure are derived in large part from the available data. Always take into account that the treatment will also be dependent on availability of resources and existing treatment guidelines, considered by other Pain Management Practitioners as being common knowledge and practice, at the time of the intervention. For Medico-Legal purposes, it is also important to point out that variation in procedural  techniques and pharmacological choices are the acceptable norm. The indications, contraindications, technique, and results of the above procedure should only be interpreted and judged by a Board-Certified Interventional Pain Specialist  with extensive familiarity and expertise in the same exact procedure and technique.

## 2021-02-27 NOTE — Patient Instructions (Signed)

## 2021-02-27 NOTE — Progress Notes (Signed)
Safety precautions to be maintained throughout the outpatient stay will include: orient to surroundings, keep bed in low position, maintain call bell within reach at all times, provide assistance with transfer out of bed and ambulation.  

## 2021-02-28 ENCOUNTER — Telehealth: Payer: Self-pay

## 2021-02-28 NOTE — Telephone Encounter (Signed)
Post procedure phone call. Patient states she is doing good.  

## 2021-03-19 ENCOUNTER — Ambulatory Visit (INDEPENDENT_AMBULATORY_CARE_PROVIDER_SITE_OTHER): Payer: Medicare Other | Admitting: Family Medicine

## 2021-03-19 ENCOUNTER — Encounter: Payer: Self-pay | Admitting: Family Medicine

## 2021-03-19 ENCOUNTER — Other Ambulatory Visit: Payer: Self-pay

## 2021-03-19 VITALS — BP 138/76 | HR 80 | Ht 71.0 in | Wt 274.0 lb

## 2021-03-19 DIAGNOSIS — E782 Mixed hyperlipidemia: Secondary | ICD-10-CM

## 2021-03-19 DIAGNOSIS — I10 Essential (primary) hypertension: Secondary | ICD-10-CM | POA: Diagnosis not present

## 2021-03-19 DIAGNOSIS — Z23 Encounter for immunization: Secondary | ICD-10-CM | POA: Diagnosis not present

## 2021-03-19 DIAGNOSIS — J4520 Mild intermittent asthma, uncomplicated: Secondary | ICD-10-CM | POA: Diagnosis not present

## 2021-03-19 DIAGNOSIS — J0111 Acute recurrent frontal sinusitis: Secondary | ICD-10-CM | POA: Diagnosis not present

## 2021-03-19 DIAGNOSIS — B3731 Acute candidiasis of vulva and vagina: Secondary | ICD-10-CM | POA: Diagnosis not present

## 2021-03-19 DIAGNOSIS — K219 Gastro-esophageal reflux disease without esophagitis: Secondary | ICD-10-CM

## 2021-03-19 DIAGNOSIS — J069 Acute upper respiratory infection, unspecified: Secondary | ICD-10-CM | POA: Diagnosis not present

## 2021-03-19 DIAGNOSIS — E876 Hypokalemia: Secondary | ICD-10-CM | POA: Diagnosis not present

## 2021-03-19 LAB — POCT URINALYSIS DIPSTICK
Bilirubin, UA: NEGATIVE
Blood, UA: NEGATIVE
Glucose, UA: NEGATIVE
Ketones, UA: NEGATIVE
Leukocytes, UA: NEGATIVE
Nitrite, UA: NEGATIVE
Protein, UA: NEGATIVE
Spec Grav, UA: 1.01 (ref 1.010–1.025)
Urobilinogen, UA: 0.2 E.U./dL
pH, UA: 6 (ref 5.0–8.0)

## 2021-03-19 MED ORDER — OMEPRAZOLE 40 MG PO CPDR: DELAYED_RELEASE_CAPSULE | ORAL | 1 refills | Status: DC

## 2021-03-19 MED ORDER — OMEGA-3-ACID ETHYL ESTERS 1 G PO CAPS: 1.0000 | ORAL_CAPSULE | Freq: Two times a day (BID) | ORAL | 1 refills | Status: DC

## 2021-03-19 MED ORDER — LOSARTAN POTASSIUM 25 MG PO TABS: 25.0000 mg | ORAL_TABLET | ORAL | 1 refills | Status: DC

## 2021-03-19 MED ORDER — POTASSIUM CHLORIDE CRYS ER 20 MEQ PO TBCR: 10.0000 meq | EXTENDED_RELEASE_TABLET | Freq: Every day | ORAL | 1 refills | Status: DC

## 2021-03-19 MED ORDER — MONTELUKAST SODIUM 10 MG PO TABS: ORAL_TABLET | ORAL | 1 refills | Status: DC

## 2021-03-19 MED ORDER — AZITHROMYCIN 250 MG PO TABS: ORAL_TABLET | ORAL | 0 refills | Status: AC

## 2021-03-19 MED ORDER — KETOCONAZOLE 2 % EX CREA: TOPICAL_CREAM | Freq: Every day | CUTANEOUS | 2 refills | Status: DC | PRN

## 2021-03-19 MED ORDER — TRIAMTERENE-HCTZ 75-50 MG PO TABS: 1.0000 | ORAL_TABLET | Freq: Every day | ORAL | 1 refills | Status: DC

## 2021-03-19 MED ORDER — HYDRALAZINE HCL 50 MG PO TABS: ORAL_TABLET | ORAL | 1 refills | Status: DC

## 2021-03-19 MED ORDER — ALBUTEROL SULFATE HFA 108 (90 BASE) MCG/ACT IN AERS: INHALATION_SPRAY | RESPIRATORY_TRACT | 0 refills | Status: DC

## 2021-03-19 NOTE — Progress Notes (Signed)
Date:  03/19/2021   Name:  Michele Moore   DOB:  [birth year]   MRN:  [id number]   Chief Complaint: Allergic Rhinitis , Hypertension, Gastroesophageal Reflux, hypokalemia, Urinary Tract Infection (Burning when urinates), and Flu Vaccine  Hypertension This is a chronic problem. The current episode started more than 1 year ago. The problem has been gradually improving since onset. The problem is controlled. Pertinent negatives include no chest pain, headaches, neck pain, palpitations or shortness of breath. Risk factors for coronary artery disease include dyslipidemia. Past treatments include angiotensin blockers, diuretics and direct vasodilators. The current treatment provides moderate improvement. There are no compliance problems.  There is no history of angina, kidney disease, CAD/MI, CVA, heart failure, left ventricular hypertrophy, PVD or retinopathy. There is no history of chronic renal disease, a hypertension causing med or renovascular disease.  Gastroesophageal Reflux She reports no abdominal pain, no belching, no chest pain, no choking, no coughing, no dysphagia, no early satiety, no globus sensation, no heartburn, no hoarse voice, no nausea, no sore throat, no stridor, no tooth decay, no water brash or no wheezing. This is a chronic problem. The symptoms are aggravated by certain foods. She has tried a PPI for the symptoms. The treatment provided moderate relief.  Urinary Tract Infection  This is a chronic problem. The current episode started more than 1 year ago. The problem occurs intermittently. The problem has been waxing and waning. The quality of the pain is described as aching. The pain is moderate. Pertinent negatives include no chills, frequency, hematuria, nausea or urgency. The treatment provided moderate relief.  Sinusitis This is a new problem. The current episode started in the past 7 days. The problem has been waxing and waning since onset. There has been no fever. Pertinent  negatives include no chills, coughing, ear pain, headaches, hoarse voice, neck pain, shortness of breath or sore throat.   Lab Results  Component Value Date   NA 144 06/12/2020   K 4.3 06/12/2020   CO2 25 06/12/2020   GLUCOSE 91 06/12/2020   BUN 30 (H) 06/12/2020   CREATININE 1.64 (H) 06/12/2020   CALCIUM 9.9 06/12/2020   EGFR 33 (L) 06/12/2020   GFRNONAA 30 (L) 12/07/2019   Lab Results  Component Value Date   CHOL 199 04/12/2019   HDL 84 04/12/2019   LDLCALC 102 (H) 04/12/2019   TRIG 69 04/12/2019   CHOLHDL 4.2 10/01/2018   Lab Results  Component Value Date   TSH 1.280 06/12/2020   Lab Results  Component Value Date   HGBA1C 5.4 10/01/2018   Lab Results  Component Value Date   WBC 5.2 01/01/2021   HGB 10.5 (L) 01/01/2021   HCT 32.7 (L) 01/01/2021   MCV 94.0 01/01/2021   PLT 181 01/01/2021   Lab Results  Component Value Date   ALT 16 06/12/2020   AST 25 06/12/2020   ALKPHOS 98 06/12/2020   BILITOT 0.3 06/12/2020   No results found for: 25OHVITD2, 25OHVITD3, VD25OH   Review of Systems  Constitutional:  Negative for chills and fever.  HENT:  Negative for drooling, ear discharge, ear pain, hoarse voice and sore throat.   Respiratory:  Negative for cough, choking, shortness of breath and wheezing.   Cardiovascular:  Negative for chest pain, palpitations and leg swelling.  Gastrointestinal:  Negative for abdominal pain, blood in stool, constipation, diarrhea, dysphagia, heartburn and nausea.  Endocrine: Negative for polydipsia.  Genitourinary:  Negative for dysuria, frequency, hematuria and urgency.  Musculoskeletal:  Negative for back pain, myalgias and neck pain.  Skin:  Negative for rash.  Allergic/Immunologic: Negative for environmental allergies.  Neurological:  Negative for dizziness and headaches.  Hematological:  Does not bruise/bleed easily.  Psychiatric/Behavioral:  Negative for suicidal ideas. The patient is not nervous/anxious.    Patient Active  Problem List   Diagnosis Date Noted   Chronic instability of left knee 02/12/2021   Long term methotrexate user 01/03/2021   Stage 3b chronic kidney disease ([place]) 01/03/2021   Piriformis syndrome of left side 11/15/2020   Normocytic anemia 04/22/2020   Lumbar spondylosis 02/16/2020   Arthritis of left knee 11/24/2019   Left hip pain 08/08/2019   Primary osteoarthritis of left hip 08/08/2019   Non-seasonal allergic rhinitis 04/12/2019   Bursitis of left shoulder 03/24/2019   Benign essential hypertension 03/06/2019   Proteinuria 03/06/2019   Trochanteric bursitis of left hip 10/04/2018   SI joint arthritis (left) 10/04/2018   Exertional chest pain 09/30/2018   CKD (chronic kidney disease), stage III ([place]) 09/30/2018   Psoriatic arthritis ([place]) 04/23/2018   Trigger ring finger of left hand 04/14/2018   Female pelvic pain 01/20/2018   History of revision of total knee arthroplasty 01/20/2018   Iron deficiency anemia    Heme + stool    Acute gastritis without hemorrhage    Gastric polyps    Benign neoplasm of ascending colon    Benign neoplasm of transverse colon    Polyp of sigmoid colon    Hypertension 10/01/2017   Hypothyroidism 10/01/2017   Gastroesophageal reflux disease 10/01/2017   Hypokalemia 10/01/2017   Mixed hyperlipidemia 10/01/2017   Reactive airway disease 10/01/2017   Bradycardia 08/24/2017   Severe obesity (BMI 35.0-35.9 with comorbidity) ([place]) 08/18/2017   Chronic gouty arthropathy without tophi 06/25/2017   Encounter for long-term (current) use of high-risk medication 06/25/2017   Positive ANA (antinuclear antibody) 06/17/2017   Cervical facet syndrome 06/16/2017   Primary osteoarthritis of right shoulder 06/16/2017   Sprain of anterior talofibular ligament of right ankle 06/16/2017   Lumbar radiculopathy 04/21/2017   Lumbar degenerative disc disease 04/21/2017   Chronic pain syndrome 04/21/2017   Spinal stenosis, lumbar region, with neurogenic  claudication 04/21/2017   SS-A antibody positive 03/05/2017   Chronic knee pain after total replacement of left knee joint 08/03/2015   DOE (dyspnea on exertion) 12/28/2013    Allergies  Allergen Reactions   Ampicillin Swelling   Penicillins Anaphylaxis and Swelling    IgE = 10 (11/05/2017)  Has patient had a PCN reaction causing immediate rash, facial/tongue/throat swelling, SOB or lightheadedness with hypotension: Yes Has patient had a PCN reaction causing severe rash involving mucus membranes or skin necrosis: No Has patient had a PCN reaction that required hospitalization: No Has patient had a PCN reaction occurring within the last 10 years: Yes If all of the above answers are "NO", then may proceed with Cephalosporin use.   Vancomycin     Other reaction(s): Other (see comments)   Vibramycin [Doxycycline Calcium] Rash    Past Surgical History:  Procedure Laterality Date   ANKLE SURGERY     CARPAL TUNNEL RELEASE     x3   COLONOSCOPY  2000?   COLONOSCOPY WITH PROPOFOL N/A 10/22/2017   Procedure: COLONOSCOPY WITH PROPOFOL;  Surgeon: Lucilla Lame, MD;  Location: [place];  Service: Endoscopy;  Laterality: N/A;   ESOPHAGOGASTRODUODENOSCOPY (EGD) WITH PROPOFOL N/A 10/22/2017   Procedure: ESOPHAGOGASTRODUODENOSCOPY (EGD) WITH PROPOFOL;  Surgeon: Lucilla Lame, MD;  Location: [facility];  Service: Endoscopy;  Laterality: N/A;   FUSION OF TALONAVICULAR JOINT Right 08/03/2015   Procedure: TAILOR NAVICULAR JOINT FUSION - RIGHT ;  Surgeon: [name], DPM;  Location: [facility];  Service: Podiatry;  Laterality: Right;   JOINT REPLACEMENT Right    knee x 3 ,right x1 and left x2   POLYPECTOMY N/A 10/22/2017   Procedure: POLYPECTOMY;  Surgeon: Lucilla Lame, MD;  Location: [place];  Service: Endoscopy;  Laterality: N/A;   ROTATOR CUFF REPAIR     TONSILLECTOMY     TOTAL KNEE REVISION Right 01/20/2018   Procedure: POLYETHYLENE EXCHANGE;  Surgeon: Dereck Leep,  MD;  Location: [facility];  Service: Orthopedics;  Laterality: Right;   TUBAL LIGATION     UPPER GI ENDOSCOPY  2000?    Social History   Tobacco Use   Smoking status: Former    Packs/day: 2.00    Years: 20.00    Pack years: 40.00    Types: Cigarettes    Quit date: 02/24/1993    Years since quitting: 28.0   Smokeless tobacco: Never  Vaping Use   Vaping Use: Never used  Substance Use Topics   Alcohol use: No    Alcohol/week: 0.0 standard drinks   Drug use: No     Medication list has been reviewed and updated.  Current Meds  Medication Sig   albuterol (VENTOLIN HFA) 108 (90 Base) MCG/ACT inhaler INHALE 2 PUFFS INTO THE LUNGS EVERY 4 HOURS AS NEEDED FOR WHEEZING   allopurinol (ZYLOPRIM) 100 MG tablet Take 200 mg by mouth every morning.    Apremilast (OTEZLA PO) Take by mouth.   aspirin EC 81 MG tablet Take 81 mg by mouth daily. Swallow whole.   betamethasone dipropionate 0.05 % cream APPLY TO PSORIASIS ON HANDS TWICE DAILY ONLY. AVOID FACE, GROIN AND AXILLA (Patient taking differently: as needed.)   betamethasone valerate (VALISONE) 0.1 % cream Apply topically 2 (two) times daily as needed (Rash).   calcipotriene (DOVONOX) 0.005 % cream Apply to psoriasis areas on legs and feet daily once or twice a day   Cholecalciferol (VITAMIN D3) 2000 units TABS Take 2,000 Units by mouth daily.   diclofenac sodium (VOLTAREN) 1 % GEL Apply 2 g topically 3 (three) times daily.    ferrous sulfate 325 (65 FE) MG tablet Take 325 mg by mouth daily with breakfast.    gabapentin (NEURONTIN) 600 MG tablet Take 1 tablet (600 mg total) by mouth at bedtime.   hydrALAZINE (APRESOLINE) 50 MG tablet TAKE 1 TABLET(50 MG) BY MOUTH TWICE DAILY   HYDROcodone-acetaminophen (NORCO) 7.5-325 MG tablet Take 1 tablet by mouth every 8 (eight) hours as needed for severe pain. Must last 30 days.   [START ON 03/25/2021] HYDROcodone-acetaminophen (NORCO) 7.5-325 MG tablet Take 1 tablet by mouth every 8 (eight) hours as needed  for severe pain. Must last 30 days.   [START ON 04/24/2021] HYDROcodone-acetaminophen (NORCO) 7.5-325 MG tablet Take 1 tablet by mouth every 8 (eight) hours as needed for severe pain. Must last 30 days.   ketoconazole (NIZORAL) 2 % cream daily as needed.    levothyroxine (SYNTHROID) 175 MCG tablet TAKE 1 TABLET BY MOUTH EVERY DAY   losartan (COZAAR) 25 MG tablet Take 1 tablet (25 mg total) by mouth every morning.   meclizine (ANTIVERT) 12.5 MG tablet TAKE 1 TABLET(12.5 MG) BY MOUTH THREE TIMES DAILY AS NEEDED FOR DIZZINESS   methotrexate (RHEUMATREX) 2.5 MG tablet Take 7.5 mg by mouth every Friday. Take  5 tablets by mouth once weekly   montelukast (SINGULAIR) 10 MG tablet TAKE 1 TABLET(10 MG) BY MOUTH AT BEDTIME   Multiple Vitamins-Calcium (ONE-A-DAY WOMENS FORMULA PO) Take 1 tablet by mouth daily.   mupirocin ointment (BACTROBAN) 2 % Apply 1 application topically 2 (two) times daily.   omega-3 acid ethyl esters (LOVAZA) 1 g capsule TAKE 1 CAPSULE BY MOUTH TWICE DAILY   omeprazole (PRILOSEC) 40 MG capsule TAKE 1 CAPSULE(40 MG) BY MOUTH DAILY   OTEZLA 30 MG TABS Take 1 tablet by mouth daily as needed.   Polyethyl Glycol-Propyl Glycol (SYSTANE OP) Place 1 drop into both eyes daily as needed (dry eyes).    potassium chloride SA (KLOR-CON M) 20 MEQ tablet Take 0.5 tablets (10 mEq total) by mouth daily.   tiZANidine (ZANAFLEX) 4 MG tablet Take 1 tablet (4 mg total) by mouth at bedtime.   triamcinolone (KENALOG) 0.1 % Apply 1 application topically 2 (two) times daily.   triamterene-hydrochlorothiazide (MAXZIDE) 75-50 MG tablet TAKE 1 TABLET BY MOUTH DAILY    PHQ 2/9 Scores 02/27/2021 02/12/2021 09/17/2020 06/12/2020  PHQ - 2 Score 0 0 0 0  PHQ- 9 Score - - - 0    GAD 7 : Generalized Anxiety Score 06/12/2020 12/08/2019 09/08/2019  Nervous, Anxious, on Edge 0 0 0  Control/stop worrying 0 0 0  Worry too much - different things 0 0 0  Trouble relaxing 0 0 0  Restless 0 0 0  Easily annoyed or irritable 0  0 0  Afraid - awful might happen 0 0 0  Total GAD 7 Score 0 0 0    BP Readings from Last 3 Encounters:  03/19/21 138/76  02/27/21 (!) 142/68  02/12/21 (!) 141/85    Physical Exam Vitals and nursing note reviewed.  Constitutional:      Appearance: She is well-developed.  HENT:     Head: Normocephalic.     Right Ear: Tympanic membrane and external ear normal.     Left Ear: Tympanic membrane and external ear normal.     Nose: Nose normal. No congestion or rhinorrhea.  Eyes:     General: Lids are everted, no foreign bodies appreciated. No scleral icterus.       Left eye: No foreign body or hordeolum.     Conjunctiva/sclera: Conjunctivae normal.     Right eye: Right conjunctiva is not injected.     Left eye: Left conjunctiva is not injected.     Pupils: Pupils are equal, round, and reactive to light.  Neck:     Thyroid: No thyromegaly.     Vascular: No JVD.     Trachea: No tracheal deviation.  Cardiovascular:     Rate and Rhythm: Normal rate and regular rhythm.     Heart sounds: Normal heart sounds. No murmur heard.   No friction rub. No gallop.  Pulmonary:     Effort: Pulmonary effort is normal. No respiratory distress.     Breath sounds: Normal breath sounds. No wheezing, rhonchi or rales.  Abdominal:     General: Bowel sounds are normal.     Palpations: Abdomen is soft. There is no mass.     Tenderness: There is no abdominal tenderness. There is no guarding or rebound.  Musculoskeletal:        General: No tenderness. Normal range of motion.     Cervical back: Normal range of motion and neck supple.  Lymphadenopathy:     Cervical: No cervical adenopathy.  Skin:  General: Skin is warm.     Findings: No rash.  Neurological:     Mental Status: She is alert and oriented to person, place, and time.     Cranial Nerves: No cranial nerve deficit.     Deep Tendon Reflexes: Reflexes normal.  Psychiatric:        Mood and Affect: Mood is not anxious or depressed.    Wt  Readings from Last 3 Encounters:  03/19/21 274 lb (124.3 kg)  02/27/21 262 lb (118.8 kg)  02/12/21 262 lb (118.8 kg)    BP 138/76    Pulse 80    Ht '5\' 11"'  (1.803 m)    Wt 274 lb (124.3 kg)    BMI 38.22 kg/m   Assessment and Plan:  1. Hypertension, unspecified type Chronic.  Controlled.  Stable.  Blood pressure today is 138/76.  Continue hydralazine 50 mg twice a day, losartan 25 mg once a day.,  And Maxide 75-50 once a day.  Will check renal function panel for electrolytes and GFR. - hydrALAZINE (APRESOLINE) 50 MG tablet; Take 1 tablet by mouth twice daily  Dispense: 180 tablet; Refill: 1 - losartan (COZAAR) 25 MG tablet; Take 1 tablet (25 mg total) by mouth every morning.  Dispense: 90 tablet; Refill: 1 - triamterene-hydrochlorothiazide (MAXZIDE) 75-50 MG tablet; Take 1 tablet by mouth daily.  Dispense: 90 tablet; Refill: 1 - Renal Function Panel  2. Viral upper respiratory tract infection Primarily used for allergies but patient also has frequent enough viral upper respiratory infections that she responds to Singulair 10 mg once a day. - montelukast (SINGULAIR) 10 MG tablet; TAKE 1 TABLET(10 MG) BY MOUTH AT BEDTIME  Dispense: 90 tablet; Refill: 1  3. Mixed hyperlipidemia Chronic.  Controlled.  Stable.  Continue Lovaza 1 g twice a day.  Will check lipid panel for current status of  LDL. - omega-3 acid ethyl esters (LOVAZA) 1 g capsule; Take 1 capsule (1 g total) by mouth 2 (two) times daily.  Dispense: 180 capsule; Refill: 1 - Lipid Panel With LDL/HDL Ratio  4. Gastroesophageal reflux disease Chronic.  Controlled.  Stable will continue omeprazole 40 mg once a day. - omeprazole (PRILOSEC) 40 MG capsule; TAKE 1 CAPSULE(40 MG) BY MOUTH DAILY  Dispense: 90 capsule; Refill: 1  5. Hypokalemia Chronic.  Controlled.  Stable.  Continue KCl 20 mg 1/2 tablets for total of 10 mEq daily. - potassium chloride SA (KLOR-CON M) 20 MEQ tablet; Take 0.5 tablets (10 mEq total) by mouth daily.   Dispense: 45 tablet; Refill: 1  6. Acute recurrent frontal sinusitis New onset.  Persistent.  Consistent with exam noting tenderness to the frontal sinuses.  We will treat with azithromycin to 50 mg 2 today followed by 1 a day for 4 days. - azithromycin (ZITHROMAX) 250 MG tablet; Take 2 tablets on day 1, then 1 tablet daily on days 2 through 5  Dispense: 6 tablet; Refill: 0  7. Mild intermittent reactive airway disease without complication Chronic.  Controlled.  Stable.  Intermittent.  Mild.  This is consistent with reactive airway disease for which we will counter with albuterol inhaler 2 puffs every 4-6 hours as needed wheezing. - albuterol (VENTOLIN HFA) 108 (90 Base) MCG/ACT inhaler; INHALE 2 PUFFS INTO THE LUNGS EVERY 4 HOURS AS NEEDED FOR WHEEZING  Dispense: 18 g; Refill: 0  8. Vulvovaginal candidiasis New onset patient is having vulvar burning without rash.  This has been cleared in the past with Nizoral cream we will treat with  2% Nizoral cream apply as needed twice a day. - ketoconazole (NIZORAL) 2 % cream; Apply topically daily as needed.  Dispense: 15 g; Refill: 2 - POCT urinalysis dipstick  9. Need for immunization against influenza Discussed and administered - Flu Vaccine QUAD High Dose(Fluad)

## 2021-03-20 ENCOUNTER — Telehealth: Payer: Self-pay

## 2021-03-20 ENCOUNTER — Ambulatory Visit (INDEPENDENT_AMBULATORY_CARE_PROVIDER_SITE_OTHER): Payer: Medicare Other

## 2021-03-20 DIAGNOSIS — Z0000 Encounter for general adult medical examination without abnormal findings: Secondary | ICD-10-CM | POA: Diagnosis not present

## 2021-03-20 LAB — RENAL FUNCTION PANEL
Albumin: 4.3 g/dL (ref 3.7–4.7)
BUN/Creatinine Ratio: 17 (ref 12–28)
BUN: 30 mg/dL — ABNORMAL HIGH (ref 8–27)
CO2: 24 mmol/L (ref 20–29)
Calcium: 9.9 mg/dL (ref 8.7–10.3)
Chloride: 106 mmol/L (ref 96–106)
Creatinine, Ser: 1.78 mg/dL — ABNORMAL HIGH (ref 0.57–1.00)
Glucose: 77 mg/dL (ref 70–99)
Phosphorus: 2.9 mg/dL — ABNORMAL LOW (ref 3.0–4.3)
Potassium: 4.3 mmol/L (ref 3.5–5.2)
Sodium: 144 mmol/L (ref 134–144)
eGFR: 30 mL/min/{1.73_m2} — ABNORMAL LOW (ref >59)

## 2021-03-20 LAB — LIPID PANEL WITH LDL/HDL RATIO
Cholesterol, Total: 198 mg/dL (ref 100–199)
HDL: 49 mg/dL (ref >39)
LDL Chol Calc (NIH): 121 mg/dL — ABNORMAL HIGH (ref 0–99)
LDL/HDL Ratio: 2.5 ratio (ref 0.0–3.2)
Triglycerides: 159 mg/dL — ABNORMAL HIGH (ref 0–149)
VLDL Cholesterol Cal: 28 mg/dL (ref 5–40)

## 2021-03-20 NOTE — Progress Notes (Signed)
Subjective:   Michele Moore is a 71 y.o. female who presents for Medicare Annual (Subsequent) preventive examination.  Virtual Visit via Telephone Note  I connected with  [name] on 03/20/21 at  2:40 PM EST by telephone and verified that I am speaking with the correct person using two identifiers.  Location: Patient: home Provider: [facility] Persons participating in the virtual visit: Michele Moore   I discussed the limitations, risks, security and privacy concerns of performing an evaluation and management service by telephone and the availability of in person appointments. The patient expressed understanding and agreed to proceed.  Interactive audio and video telecommunications were attempted between this nurse and patient, however failed, due to patient having technical difficulties OR patient did not have access to video capability.  We continued and completed visit with audio only.  Some vital signs may be absent or patient reported.   [name], LPN   Review of Systems     Cardiac Risk Factors include: advanced age (>19men, >29 women);dyslipidemia;hypertension;obesity (BMI >30kg/m2);sedentary lifestyle     Objective:    Today's Vitals   03/20/21 1443  PainSc: 10-Worst pain ever   There is no height or weight on file to calculate BMI.  Advanced Directives 03/20/2021 02/27/2021 02/12/2021 01/03/2021 01/03/2021 11/26/2020 09/17/2020  Does Patient Have a Medical Advance Directive? No No No No No No No  Would patient like information on creating a medical advance directive? No - Patient declined No - Patient declined - No - Patient declined No - Patient declined No - Patient declined No - Patient declined    Current Medications (verified) Outpatient Encounter Medications as of 03/20/2021  Medication Sig   albuterol (VENTOLIN HFA) 108 (90 Base) MCG/ACT inhaler INHALE 2 PUFFS INTO THE LUNGS EVERY 4 HOURS AS NEEDED FOR WHEEZING   allopurinol (ZYLOPRIM) 100 MG  tablet Take 200 mg by mouth every morning.    aspirin EC 81 MG tablet Take 81 mg by mouth daily. Swallow whole.   azithromycin (ZITHROMAX) 250 MG tablet Take 2 tablets on day 1, then 1 tablet daily on days 2 through 5   betamethasone dipropionate 0.05 % cream APPLY TO PSORIASIS ON HANDS TWICE DAILY ONLY. AVOID FACE, GROIN AND AXILLA   betamethasone valerate (VALISONE) 0.1 % cream Apply topically 2 (two) times daily as needed (Rash).   calcipotriene (DOVONOX) 0.005 % cream Apply to psoriasis areas on legs and feet daily once or twice a day   Cholecalciferol (VITAMIN D3) 2000 units TABS Take 2,000 Units by mouth daily.   diclofenac sodium (VOLTAREN) 1 % GEL Apply 2 g topically 3 (three) times daily.    ferrous sulfate 325 (65 FE) MG tablet Take 325 mg by mouth daily with breakfast.    gabapentin (NEURONTIN) 600 MG tablet Take 1 tablet (600 mg total) by mouth at bedtime.   hydrALAZINE (APRESOLINE) 50 MG tablet Take 1 tablet by mouth twice daily   HYDROcodone-acetaminophen (NORCO) 7.5-325 MG tablet Take 1 tablet by mouth every 8 (eight) hours as needed for severe pain. Must last 30 days.   ketoconazole (NIZORAL) 2 % cream Apply topically daily as needed.   levothyroxine (SYNTHROID) 175 MCG tablet TAKE 1 TABLET BY MOUTH EVERY DAY   losartan (COZAAR) 25 MG tablet Take 1 tablet (25 mg total) by mouth every morning.   meclizine (ANTIVERT) 12.5 MG tablet TAKE 1 TABLET(12.5 MG) BY MOUTH THREE TIMES DAILY AS NEEDED FOR DIZZINESS   methotrexate (RHEUMATREX) 2.5 MG tablet Take 7.5  mg by mouth every Friday. Take 5 tablets by mouth once weekly   montelukast (SINGULAIR) 10 MG tablet TAKE 1 TABLET(10 MG) BY MOUTH AT BEDTIME   Multiple Vitamins-Calcium (ONE-A-DAY WOMENS FORMULA PO) Take 1 tablet by mouth daily.   mupirocin ointment (BACTROBAN) 2 % Apply 1 application topically 2 (two) times daily.   omega-3 acid ethyl esters (LOVAZA) 1 g capsule Take 1 capsule (1 g total) by mouth 2 (two) times daily.    omeprazole (PRILOSEC) 40 MG capsule TAKE 1 CAPSULE(40 MG) BY MOUTH DAILY   OTEZLA 30 MG TABS Take 1 tablet by mouth daily as needed.   Polyethyl Glycol-Propyl Glycol (SYSTANE OP) Place 1 drop into both eyes daily as needed (dry eyes).    potassium chloride SA (KLOR-CON M) 20 MEQ tablet Take 0.5 tablets (10 mEq total) by mouth daily.   tiZANidine (ZANAFLEX) 4 MG tablet Take 1 tablet (4 mg total) by mouth at bedtime.   triamcinolone (KENALOG) 0.1 % Apply 1 application topically 2 (two) times daily.   triamterene-hydrochlorothiazide (MAXZIDE) 75-50 MG tablet Take 1 tablet by mouth daily.   [START ON 03/25/2021] HYDROcodone-acetaminophen (NORCO) 7.5-325 MG tablet Take 1 tablet by mouth every 8 (eight) hours as needed for severe pain. Must last 30 days.   [START ON 04/24/2021] HYDROcodone-acetaminophen (NORCO) 7.5-325 MG tablet Take 1 tablet by mouth every 8 (eight) hours as needed for severe pain. Must last 30 days.   [DISCONTINUED] Apremilast (OTEZLA PO) Take by mouth.   [DISCONTINUED] fluticasone (FLONASE) 50 MCG/ACT nasal spray SHAKE LIQUID AND USE 1 SPRAY IN EACH NOSTRIL DAILY   [DISCONTINUED] mometasone (NASONEX) 50 MCG/ACT nasal spray Place 2 sprays into the nose daily.   No facility-administered encounter medications on file as of 03/20/2021.    Allergies (verified) Ampicillin, Penicillins, Vancomycin, and Vibramycin [doxycycline calcium]   History: Past Medical History:  Diagnosis Date   Allergy    Anemia    Arthritis    Asthma    Cataract    Chronic kidney disease    STAGE 3 PER DR EASON 08/02/15   Collagen vascular disease ([place])    Complication of anesthesia    WOKE UP DURING COLONOSCOPY   Dyspnea    Dysrhythmia    IRREGULAR HEART BEAT   Family history of adverse reaction to anesthesia    sister difficult to put to sleep   GERD (gastroesophageal reflux disease)    Glaucoma    H/O tooth extraction    all lower teeth 1/19   Hemorrhoid    History of hiatal hernia    History of  kidney stones    H/O   Hypertension    Hypothyroidism    Migraines    Mixed hyperlipidemia    Multiple gastric ulcers    Thyroid disease    Wears dentures    partial upper and lower   Past Surgical History:  Procedure Laterality Date   ANKLE SURGERY     CARPAL TUNNEL RELEASE     x3   COLONOSCOPY  2000?   COLONOSCOPY WITH PROPOFOL N/A 10/22/2017   Procedure: COLONOSCOPY WITH PROPOFOL;  Surgeon: Lucilla Lame, MD;  Location: [place];  Service: Endoscopy;  Laterality: N/A;   ESOPHAGOGASTRODUODENOSCOPY (EGD) WITH PROPOFOL N/A 10/22/2017   Procedure: ESOPHAGOGASTRODUODENOSCOPY (EGD) WITH PROPOFOL;  Surgeon: Lucilla Lame, MD;  Location: [place];  Service: Endoscopy;  Laterality: N/A;   FUSION OF TALONAVICULAR JOINT Right 08/03/2015   Procedure: TAILOR NAVICULAR JOINT FUSION - RIGHT ;  Surgeon: Larkin Ina  Vickki Muff, DPM;  Location: [facility];  Service: Podiatry;  Laterality: Right;   JOINT REPLACEMENT Right    knee x 3 ,right x1 and left x2   POLYPECTOMY N/A 10/22/2017   Procedure: POLYPECTOMY;  Surgeon: Lucilla Lame, MD;  Location: [place];  Service: Endoscopy;  Laterality: N/A;   ROTATOR CUFF REPAIR     TONSILLECTOMY     TOTAL KNEE REVISION Right 01/20/2018   Procedure: POLYETHYLENE EXCHANGE;  Surgeon: Dereck Leep, MD;  Location: [facility];  Service: Orthopedics;  Laterality: Right;   TUBAL LIGATION     UPPER GI ENDOSCOPY  2000?   Family History  Problem Relation Age of Onset   Heart failure Mother    Gout Mother    Arthritis Mother    Hypertension Mother    Stroke Mother    Heart failure Father    Diabetes Father    Hyperlipidemia Father    COPD Sister    Cancer Maternal Aunt        breast   Cancer Maternal Uncle        kidney   Breast cancer Neg Hx    Social History   Socioeconomic History   Marital status: Divorced    Spouse name: Not on file   Number of children: 4   Years of education: Not on file   Highest education level: 9th grade   Occupational History   Occupation: retired  Tobacco Use   Smoking status: Former    Packs/day: 2.00    Years: 20.00    Pack years: 40.00    Types: Cigarettes    Quit date: 02/24/1993    Years since quitting: 28.0   Smokeless tobacco: Never  Vaping Use   Vaping Use: Never used  Substance and Sexual Activity   Alcohol use: No    Alcohol/week: 0.0 standard drinks   Drug use: No   Sexual activity: Not Currently  Other Topics Concern   Not on file  Social History Narrative   Pt lives alone   Social Determinants of Health   Financial Resource Strain: Low Risk    Difficulty of Paying Living Expenses: Not very hard  Food Insecurity: No Food Insecurity   Worried About Charity fundraiser in the Last Year: Never true   Ran Out of Food in the Last Year: Never true  Transportation Needs: No Transportation Needs   Lack of Transportation (Medical): No   Lack of Transportation (Non-Medical): No  Physical Activity: Inactive   Days of Exercise per Week: 0 days   Minutes of Exercise per Session: 0 min  Stress: No Stress Concern Present   Feeling of Stress : Not at all  Social Connections: Moderately Isolated   Frequency of Communication with Friends and Family: More than three times a week   Frequency of Social Gatherings with Friends and Family: More than three times a week   Attends Religious Services: More than 4 times per year   Active Member of [employer] or Organizations: No   Attends Music therapist: Never   Marital Status: Divorced    Tobacco Counseling Counseling given: Not Answered   Clinical Intake:  Pre-visit preparation completed: Yes  Pain Score: 10-Worst pain ever Pain Type: Chronic pain Pain Location: Knee Pain Orientation: Left Pain Onset: More than a month ago Pain Frequency: Constant     Nutritional Status: BMI > 30  Obese Nutritional Risks: None Diabetes: No  How often do you need to have someone help  you when you read instructions,  pamphlets, or other written materials from your doctor or pharmacy?: 1 - Never    Interpreter Needed?: No  Information entered by :: [name] LPN   Activities of Daily Living In your present state of health, do you have any difficulty performing the following activities: 03/20/2021  Hearing? Y  Vision? N  Difficulty concentrating or making decisions? N  Walking or climbing stairs? Y  Dressing or bathing? N  Doing errands, shopping? N  Preparing Food and eating ? N  Using the Toilet? N  In the past six months, have you accidently leaked urine? N  Do you have problems with loss of bowel control? N  Managing your Medications? N  Managing your Finances? N  Housekeeping or managing your Housekeeping? N  Some recent data might be hidden    Patient Care Team: Juline Patch, MD as PCP - General (Family Medicine) Anthonette Legato, MD (Nephrology) Gillis Santa, MD as Consulting Physician (Pain Medicine) Hooten, Laurice Record, MD (Orthopedic Surgery) Earlie Server, MD as Consulting Physician (Oncology) Quintin Alto, MD as Consulting Physician (Rheumatology) [name], DPM as Referring Physician (Podiatry)  Indicate any recent Medical Services you may have received from other than [facility] providers in the past year (date may be approximate).     Assessment:   This is a routine wellness examination for Michele Moore.  Hearing/Vision screen Hearing Screening - Comments:: Pt has mild hearing difficulty Vision Screening - Comments:: Annual vision screenings by [facility]  Dietary issues and exercise activities discussed: Current Exercise Habits: The patient does not participate in regular exercise at present, Exercise limited by: orthopedic condition(s)   Goals Addressed             This Visit's Progress    Increase physical activity   Not on track    Increase physical activity as tolerated.       Depression Screen PHQ 2/9 Scores 03/20/2021 02/27/2021 02/12/2021 09/17/2020  06/12/2020 03/19/2020 12/08/2019  PHQ - 2 Score 0 0 0 0 0 0 0  PHQ- 9 Score - - - - 0 - 0    Fall Risk Fall Risk  03/20/2021 02/27/2021 02/12/2021 11/26/2020 11/15/2020  Falls in the past year? 1 0 1 0 0  Comment - - - - stumbles a lot  Number falls in past yr: 1 - 0 - 0  Injury with Fall? 0 - 0 - 0  Comment - - - - -  Risk Factor Category  - - - - -  Risk for fall due to : Impaired mobility;Impaired balance/gait - - - Impaired balance/gait  Risk for fall due to: Comment - - - - -  Follow up Falls prevention discussed - - - -    FALL RISK PREVENTION PERTAINING TO THE HOME:  Any stairs in or around the home? No  If so, are there any without handrails? No  Home free of loose throw rugs in walkways, pet beds, electrical cords, etc? Yes  Adequate lighting in your home to reduce risk of falls? Yes   ASSISTIVE DEVICES UTILIZED TO PREVENT FALLS:  Life alert? No  Use of a cane, walker or w/c? Yes  Grab bars in the bathroom? Yes  Shower chair or bench in shower? Yes  Elevated toilet seat or a handicapped toilet? Yes   TIMED UP AND GO:  Was the test performed? No .  Telephonic visit  Cognitive Function: Normal cognitive status assessed by direct observation by this  Nurse Health Advisor. No abnormalities found.       6CIT Screen 03/09/2019 03/01/2018  What Year? 0 points 0 points  What month? 0 points 0 points  What time? 0 points 0 points  Count back from 20 0 points 0 points  Months in reverse 0 points 0 points  Repeat phrase 0 points 2 points  Total Score 0 2    Immunizations Immunization History  Administered Date(s) Administered   Fluad Quad(high Dose 65+) 10/19/2018, 12/08/2019, 03/19/2021   Influenza, High Dose Seasonal PF 12/23/2016   Influenza-Unspecified 11/07/2014, 12/23/2016   PFIZER(Purple Top)SARS-COV-2 Vaccination 05/20/2019, 06/17/2019   Pneumococcal Conjugate-13 11/07/2014   Pneumococcal Polysaccharide-23 12/23/2016   Zoster, Live 11/07/2014    TDAP status:  Due, Education has been provided regarding the importance of this vaccine. Advised may receive this vaccine at local pharmacy or Health Dept. Aware to provide a copy of the vaccination record if obtained from local pharmacy or Health Dept. Verbalized acceptance and understanding.  Flu Vaccine status: Up to date  Pneumococcal vaccine status: Up to date  Covid-19 vaccine status: Completed vaccines  Qualifies for Shingles Vaccine? Yes   Zostavax completed Yes   Shingrix Completed?: No.    Education has been provided regarding the importance of this vaccine. Patient has been advised to call insurance company to determine out of pocket expense if they have not yet received this vaccine. Advised may also receive vaccine at local pharmacy or Health Dept. Verbalized acceptance and understanding.  Screening Tests Health Maintenance  Topic Date Due   COVID-19 Vaccine (3 - Pfizer risk series) 04/04/2021 (Originally 07/15/2019)   Zoster Vaccines- Shingrix (1 of 2) 06/17/2021 (Originally 10/05/1969)   TETANUS/TDAP  03/19/2022 (Originally 10/05/1969)   Hepatitis C Screening  03/19/2022 (Originally 10/05/1968)   MAMMOGRAM  04/05/2021   COLONOSCOPY (Pts 45-65yrs Insurance coverage will need to be confirmed)  10/23/2022   Pneumonia Vaccine 41+ Years old  Completed   INFLUENZA VACCINE  Completed   DEXA SCAN  Completed   HPV VACCINES  Aged Out    Health Maintenance  There are no preventive care reminders to display for this patient.  Colorectal cancer screening: Type of screening: Colonoscopy. Completed 10/22/17. Repeat every 5 years  Mammogram status: Completed 04/06/19. Repeat every year. Pt declines repeat screening at this time.   Bone Density status: Completed 04/06/19. Results reflect: Bone density results: NORMAL. Repeat every 2 years. Pt declines repeat screening at this time.   Lung Cancer Screening: (Low Dose CT Chest recommended if Age 71-80 years, 30 pack-year currently smoking OR have quit  w/in 15years.) does not qualify.   Additional Screening:  Hepatitis C Screening: does qualify; postponed  Vision Screening: Recommended annual ophthalmology exams for early detection of glaucoma and other disorders of the eye. Is the patient up to date with their annual eye exam?  No  Who is the provider or what is the name of the office in which the patient attends annual eye exams? [facility]   Dental Screening: Recommended annual dental exams for proper oral hygiene  Community Resource Referral / Chronic Care Management: CRR required this visit?  No   CCM required this visit?  No      Plan:     I have personally reviewed and noted the following in the patients chart:   Medical and social history Use of alcohol, tobacco or illicit drugs  Current medications and supplements including opioid prescriptions.  Functional ability and status Nutritional status Physical activity Advanced  directives List of other physicians Hospitalizations, surgeries, and ER visits in previous 12 months Vitals Screenings to include cognitive, depression, and falls Referrals and appointments  In addition, I have reviewed and discussed with patient certain preventive protocols, quality metrics, and best practice recommendations. A written personalized care plan for preventive services as well as general preventive health recommendations were provided to patient.     [name], LPN   0/30/1499   Nurse Notes: none

## 2021-03-20 NOTE — Patient Instructions (Signed)
Michele Moore , Thank you for taking time to come for your Medicare Wellness Visit. I appreciate your ongoing commitment to your health goals. Please review the following plan we discussed and let me know if I can assist you in the future.   Screening recommendations/referrals: Colonoscopy: done 10/22/17. Repeat 09/2022 Mammogram: done 04/06/19 Bone Density: done 04/06/19 Recommended yearly ophthalmology/optometry visit for glaucoma screening and checkup Recommended yearly dental visit for hygiene and checkup  Vaccinations: Influenza vaccine: done 03/19/21 Pneumococcal vaccine: done 12/23/16 Tdap vaccine: due Shingles vaccine: Shingrix discussed. Please contact your pharmacy for coverage information.  Covid-19:done 05/20/19 & 06/17/19  Advanced directives: Advance directive discussed with you today. Even though you declined this today please call our office should you change your mind and we can give you the proper paperwork for you to fill out.   Conditions/risks identified: Recommend fall prevention in the home  Next appointment: Follow up in one year for your annual wellness visit    Preventive Care 65 Years and Older, Female Preventive care refers to lifestyle choices and visits with your health care provider that can promote health and wellness. What does preventive care include? A yearly physical exam. This is also called an annual well check. Dental exams once or twice a year. Routine eye exams. Ask your health care provider how often you should have your eyes checked. Personal lifestyle choices, including: Daily care of your teeth and gums. Regular physical activity. Eating a healthy diet. Avoiding tobacco and drug use. Limiting alcohol use. Practicing safe sex. Taking low-dose aspirin every day. Taking vitamin and mineral supplements as recommended by your health care provider. What happens during an annual well check? The services and screenings done by your health care  provider during your annual well check will depend on your age, overall health, lifestyle risk factors, and family history of disease. Counseling  Your health care provider may ask you questions about your: Alcohol use. Tobacco use. Drug use. Emotional well-being. Home and relationship well-being. Sexual activity. Eating habits. History of falls. Memory and ability to understand (cognition). Work and work Statistician. Reproductive health. Screening  You may have the following tests or measurements: Height, weight, and BMI. Blood pressure. Lipid and cholesterol levels. These may be checked every 5 years, or more frequently if you are over 99 years old. Skin check. Lung cancer screening. You may have this screening every year starting at age 76 if you have a 30-pack-year history of smoking and currently smoke or have quit within the past 15 years. Fecal occult blood test (FOBT) of the stool. You may have this test every year starting at age 57. Flexible sigmoidoscopy or colonoscopy. You may have a sigmoidoscopy every 5 years or a colonoscopy every 10 years starting at age 35. Hepatitis C blood test. Hepatitis B blood test. Sexually transmitted disease (STD) testing. Diabetes screening. This is done by checking your blood sugar (glucose) after you have not eaten for a while (fasting). You may have this done every 1-3 years. Bone density scan. This is done to screen for osteoporosis. You may have this done starting at age 64. Mammogram. This may be done every 1-2 years. Talk to your health care provider about how often you should have regular mammograms. Talk with your health care provider about your test results, treatment options, and if necessary, the need for more tests. Vaccines  Your health care provider may recommend certain vaccines, such as: Influenza vaccine. This is recommended every year. Tetanus, diphtheria, and acellular pertussis (  Tdap, Td) vaccine. You may need a Td  booster every 10 years. Zoster vaccine. You may need this after age 37. Pneumococcal 13-valent conjugate (PCV13) vaccine. One dose is recommended after age 34. Pneumococcal polysaccharide (PPSV23) vaccine. One dose is recommended after age 21. Talk to your health care provider about which screenings and vaccines you need and how often you need them. This information is not intended to replace advice given to you by your health care provider. Make sure you discuss any questions you have with your health care provider. Document Released: 03/09/2015 Document Revised: 10/31/2015 Document Reviewed: 12/12/2014 Elsevier Interactive Patient Education  2017 [employer] Prevention in the Home Falls can cause injuries. They can happen to people of all ages. There are many things you can do to make your home safe and to help prevent falls. What can I do on the outside of my home? Regularly fix the edges of walkways and driveways and fix any cracks. Remove anything that might make you trip as you walk through a door, such as a raised step or threshold. Trim any bushes or trees on the path to your home. Use bright outdoor lighting. Clear any walking paths of anything that might make someone trip, such as rocks or tools. Regularly check to see if handrails are loose or broken. Make sure that both sides of any steps have handrails. Any raised decks and porches should have guardrails on the edges. Have any leaves, snow, or ice cleared regularly. Use sand or salt on walking paths during winter. Clean up any spills in your garage right away. This includes oil or grease spills. What can I do in the bathroom? Use night lights. Install grab bars by the toilet and in the tub and shower. Do not use towel bars as grab bars. Use non-skid mats or decals in the tub or shower. If you need to sit down in the shower, use a plastic, non-slip stool. Keep the floor dry. Clean up any water that spills on the floor  as soon as it happens. Remove soap buildup in the tub or shower regularly. Attach bath mats securely with double-sided non-slip rug tape. Do not have throw rugs and other things on the floor that can make you trip. What can I do in the bedroom? Use night lights. Make sure that you have a light by your bed that is easy to reach. Do not use any sheets or blankets that are too big for your bed. They should not hang down onto the floor. Have a firm chair that has side arms. You can use this for support while you get dressed. Do not have throw rugs and other things on the floor that can make you trip. What can I do in the kitchen? Clean up any spills right away. Avoid walking on wet floors. Keep items that you use a lot in easy-to-reach places. If you need to reach something above you, use a strong step stool that has a grab bar. Keep electrical cords out of the way. Do not use floor polish or wax that makes floors slippery. If you must use wax, use non-skid floor wax. Do not have throw rugs and other things on the floor that can make you trip. What can I do with my stairs? Do not leave any items on the stairs. Make sure that there are handrails on both sides of the stairs and use them. Fix handrails that are broken or loose. Make sure that handrails are  as long as the stairways. Check any carpeting to make sure that it is firmly attached to the stairs. Fix any carpet that is loose or worn. Avoid having throw rugs at the top or bottom of the stairs. If you do have throw rugs, attach them to the floor with carpet tape. Make sure that you have a light switch at the top of the stairs and the bottom of the stairs. If you do not have them, ask someone to add them for you. What else can I do to help prevent falls? Wear shoes that: Do not have high heels. Have rubber bottoms. Are comfortable and fit you well. Are closed at the toe. Do not wear sandals. If you use a stepladder: Make sure that it is  fully opened. Do not climb a closed stepladder. Make sure that both sides of the stepladder are locked into place. Ask someone to hold it for you, if possible. Clearly mark and make sure that you can see: Any grab bars or handrails. First and last steps. Where the edge of each step is. Use tools that help you move around (mobility aids) if they are needed. These include: Canes. Walkers. Scooters. Crutches. Turn on the lights when you go into a dark area. Replace any light bulbs as soon as they burn out. Set up your furniture so you have a clear path. Avoid moving your furniture around. If any of your floors are uneven, fix them. If there are any pets around you, be aware of where they are. Review your medicines with your doctor. Some medicines can make you feel dizzy. This can increase your chance of falling. Ask your doctor what other things that you can do to help prevent falls. This information is not intended to replace advice given to you by your health care provider. Make sure you discuss any questions you have with your health care provider. Document Released: 12/07/2008 Document Revised: 07/19/2015 Document Reviewed: 03/17/2014 Elsevier Interactive Patient Education  2017 [employer].

## 2021-03-21 ENCOUNTER — Ambulatory Visit
Payer: Medicare Other | Attending: Student in an Organized Health Care Education/Training Program | Admitting: Student in an Organized Health Care Education/Training Program

## 2021-03-21 ENCOUNTER — Other Ambulatory Visit: Payer: Self-pay

## 2021-03-21 ENCOUNTER — Encounter: Payer: Self-pay | Admitting: Student in an Organized Health Care Education/Training Program

## 2021-03-21 DIAGNOSIS — G894 Chronic pain syndrome: Secondary | ICD-10-CM

## 2021-03-21 DIAGNOSIS — M2352 Chronic instability of knee, left knee: Secondary | ICD-10-CM | POA: Diagnosis not present

## 2021-03-21 DIAGNOSIS — G8929 Other chronic pain: Secondary | ICD-10-CM | POA: Diagnosis not present

## 2021-03-21 DIAGNOSIS — M25562 Pain in left knee: Secondary | ICD-10-CM | POA: Diagnosis not present

## 2021-03-21 DIAGNOSIS — Z96652 Presence of left artificial knee joint: Secondary | ICD-10-CM | POA: Diagnosis not present

## 2021-03-21 NOTE — Progress Notes (Signed)
Patient: Michele Moore  Service Category: E/M  Provider: Gillis Santa, MD  DOB: 1950-11-21  DOS: 03/21/2021  Location: Office  MRN: [id number]  Setting: Ambulatory outpatient  Referring Provider: Juline Patch, MD  Type: Established Patient  Specialty: Interventional Pain Management  PCP: Juline Patch, MD  Location: Remote location  Delivery: TeleHealth     Virtual Encounter - Pain Management PROVIDER NOTE: Information contained herein reflects review and annotations entered in association with encounter. Interpretation of such information and data should be left to medically-trained personnel. Information provided to patient can be located elsewhere in the medical record under "Patient Instructions". Document created using STT-dictation technology, any transcriptional errors that may result from process are unintentional.    Contact & Pharmacy Preferred: [place]: [phone] (home) Mobile: [phone] (mobile) E-mail: No e-mail address on record  [facility] [place] [address] [place] [address] Phone: [phone] Fax: [phone]   Pre-screening  Michele Moore offered "in-person" vs "virtual" encounter. She indicated preferring virtual for this encounter.   Reason COVID-19*   Social distancing based on CDC and AMA recommendations.   I contacted Michele Moore on 03/21/2021 via telephone.      I clearly identified myself as Gillis Santa, MD. I verified that I was speaking with the correct person using two identifiers (Name: Michele Moore, and date of birth: [birth year]).  Consent I sought verbal advanced consent from Michele Moore for virtual visit interactions. I informed Michele Moore of possible security and privacy concerns, risks, and limitations associated with providing "not-in-person" medical evaluation and management services. I also informed Michele Moore of the availability of "in-person" appointments.  Finally, I informed her that there would be a charge for the virtual visit and that she could be  personally, fully or partially, financially responsible for it. Michele Moore expressed understanding and agreed to proceed.   Historic Elements   Michele Moore is a 71 y.o. year old, female patient evaluated today after our last contact on 02/27/2021. Michele Moore  has a past medical history of Allergy, Anemia, Arthritis, Asthma, Cataract, Chronic kidney disease, Collagen vascular disease ([place]), Complication of anesthesia, Dyspnea, Dysrhythmia, Family history of adverse reaction to anesthesia, GERD (gastroesophageal reflux disease), Glaucoma, H/O tooth extraction, Hemorrhoid, History of hiatal hernia, History of kidney stones, Hypertension, Hypothyroidism, Migraines, Mixed hyperlipidemia, Multiple gastric ulcers, Thyroid disease, and Wears dentures. She also  has a past surgical history that includes Carpal tunnel release; Rotator cuff repair; Ankle surgery; Tubal ligation; Colonoscopy (2000?); Upper gi endoscopy (2000?); Tonsillectomy; Fusion of talonavicular joint (Right, 08/03/2015); Colonoscopy with propofol (N/A, 10/22/2017); Esophagogastroduodenoscopy (egd) with propofol (N/A, 10/22/2017); polypectomy (N/A, 10/22/2017); Total knee revision (Right, 01/20/2018); and Joint replacement (Right). Michele Moore has a current medication list which includes the following prescription(s): albuterol, allopurinol, aspirin ec, azithromycin, betamethasone valerate, calcipotriene, vitamin d3, diclofenac sodium, ferrous sulfate, gabapentin, hydralazine, hydrocodone-acetaminophen, [START ON 03/25/2021] hydrocodone-acetaminophen, [START ON 04/24/2021] hydrocodone-acetaminophen, ketoconazole, levothyroxine, losartan, meclizine, methotrexate, montelukast, multiple vitamins-calcium, mupirocin ointment, omega-3 acid ethyl esters, omeprazole, otezla, polyethyl glycol-propyl glycol, potassium chloride sa, tizanidine, triamcinolone cream,  triamterene-hydrochlorothiazide, betamethasone dipropionate, [DISCONTINUED] fluticasone, and [DISCONTINUED] mometasone. She  reports that she quit smoking about 28 years ago. Her smoking use included cigarettes. She has a 40.00 pack-year smoking history. She has never used smokeless tobacco. She reports that she does not drink alcohol and does not use drugs. Michele Moore is allergic  to ampicillin, penicillins, vancomycin, and vibramycin [doxycycline calcium].   HPI  Today, she is being contacted for a post-procedure assessment.   Post-procedure evaluation     Procedure:          Anesthesia, Analgesia, Anxiolysis:  Type: Genicular Nerves Block (Superolateral, Superomedial, and Inferomedial Genicular Nerves) #1  CPT: 12197      Primary Purpose: Diagnostic Region: Lateral, Anterior, and Medial aspects of the knee joint, above and below the knee joint proper. Level: Superior and inferior to the knee joint. Target Area: For Genicular Nerve block(s), the targets are: the superolateral genicular nerve, located in the lateral distal portion of the femoral shaft as it curves to form the lateral epicondyle, in the region of the distal femoral metaphysis; the superomedial genicular nerve, located in the medial distal portion of the femoral shaft as it curves to form the medial epicondyle; and the inferomedial genicular nerve, located in the medial, proximal portion of the tibial shaft, as it curves to form the medial epicondyle, in the region of the proximal tibial metaphysis. Approach: Anterior, percutaneous, ipsilateral approach. Laterality: Left knee  Anesthesia: Local (1-2% Lidocaine)  Anxiolysis: Oral 2.5 mg PO Valium  Sedation: None  Guidance: Fluoroscopy           Position: Modified Fowler's position with pillows under the targeted knee(s).   1. 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 Chronic instability of left knee   3. Chronic pain syndrome    NAS-11 Pain score:    Pre-procedure: 8 /10   Post-procedure: 0-No pain/10      Effectiveness:  Initial hour after procedure: 100 %  Subsequent 4-6 hours post-procedure: 100 %  Analgesia past initial 6 hours: 100 % (lasting 2 days then knee gave out and the pain is very bad now.)  Ongoing improvement:  Analgesic:  0% Function: No improvement  Pharmacotherapy Assessment   Opioid Analgesic: Norco 7.5 mg TID PRN #90/month, 22.5    Monitoring: [place] PMP: PDMP reviewed during this encounter.       Pharmacotherapy: No side-effects or adverse reactions reported. Compliance: No problems identified. Effectiveness: Clinically acceptable. Plan: Refer to "POC". UDS:  Summary  Date Value Ref Range Status  06/14/2020 Note  Final    Comment:    ==================================================================== [employer] Select 13 (MW) ==================================================================== Test                             Result       Flag       Units  Drug Present and Declared for Prescription Verification   Hydrocodone                    1019         EXPECTED   ng/mg creat   Hydromorphone                  166          EXPECTED   ng/mg creat   Dihydrocodeine                 71           EXPECTED   ng/mg creat   Norhydrocodone                 536          EXPECTED   ng/mg creat    Sources of hydrocodone include scheduled  prescription medications.    Hydromorphone, dihydrocodeine and norhydrocodone are expected    metabolites of hydrocodone. Hydromorphone and dihydrocodeine are    also available as scheduled prescription medications.  ==================================================================== Test                      Result    Flag   Units      Ref Range   Creatinine              166              mg/dL      >=20 ==================================================================== Declared Medications:  The flagging and interpretation on this report are based on the  following  declared medications.  Unexpected results may arise from  inaccuracies in the declared medications.   **Note: The testing scope of this panel includes these medications:   Hydrocodone (Norco)   **Note: The testing scope of this panel does not include the  following reported medications:   Acetaminophen (Norco)  Albuterol (Ventolin HFA)  Allopurinol (Zyloprim)  Aspirin  Betamethasone  Fluticasone (Flonase)  Gabapentin (Neurontin)  Hydralazine (Apresoline)  Hydrochlorothiazide (Maxzide)  Iron  Ketoconazole (Nizoral)  Levothyroxine (Synthroid)  Losartan (Cozaar)  Meclizine (Antivert)  Methotrexate  Metronidazole (Flagyl)  Mometasone (Nasonex)  Montelukast (Singulair)  Multivitamin  Mupirocin (Bactroban)  Omega-3 Fatty Acids  Omeprazole (Prilosec)  Polyethylene Glycol  Potassium (Klor-Con)  Tizanidine (Zanaflex)  Topical Diclofenac (Voltaren)  Triamcinolone (Kenalog)  Triamterene (Maxzide)  Vitamin D3 ==================================================================== For clinical consultation, please call [phone]. ====================================================================      Laboratory Chemistry Profile   Renal Lab Results  Component Value Date   BUN 30 (H) 03/19/2021   CREATININE 1.78 (H) 03/19/2021   BCR 17 03/19/2021   GFRAA 30 (L) 08/28/2019   GFRNONAA 30 (L) 12/07/2019    Hepatic Lab Results  Component Value Date   AST 25 06/12/2020   ALT 16 06/12/2020   ALBUMIN 4.3 03/19/2021   ALKPHOS 98 06/12/2020    Electrolytes Lab Results  Component Value Date   NA 144 03/19/2021   K 4.3 03/19/2021   CL 106 03/19/2021   CALCIUM 9.9 03/19/2021   MG 2.0 10/01/2018   PHOS 2.9 (L) 03/19/2021    Bone No results found for: VD25OH, VD125OH2TOT, KV4259DG3, OV5643PI9, 25OHVITD1, 25OHVITD2, 25OHVITD3, TESTOFREE, TESTOSTERONE  Inflammation (CRP: Acute Phase) (ESR: Chronic Phase) Lab Results  Component Value Date   CRP 0.9 04/23/2020    ESRSEDRATE 45 (H) 08/30/2020         Note: Above Lab results reviewed.  Assessment  The primary encounter diagnosis was Chronic knee pain after total replacement of left knee joint. Diagnoses of Chronic instability of left knee and Chronic pain syndrome were also pertinent to this visit.  Plan of Care  Unfortunately no benefit with diagnostic genicular nerve block.  We discussed genicular radiofrequency ablation and in both I and the patient agree that we should hold off on this given lack of analgesic and functional response from diagnostic nerve block.  She states that she will follow back up with Dr. Marry Guan to consider surgical options.  She is also experiencing her left knee giving out and increasing instability.    Follow-up plan:   Return for Keep sch. appt.    Recent Visits Date Type Provider Dept  02/27/21 Procedure visit Gillis Santa, MD [facility]  02/12/21 Office Visit Gillis Santa, MD [facility]  12/24/20 Office Visit Gillis Santa, MD [facility]  Showing recent visits within past 90 days and meeting all other requirements Today's Visits Date Type Provider Dept  03/21/21 Office Visit Gillis Santa, MD [facility]  Showing today's visits and meeting all other requirements Future Appointments Date Type Provider Dept  05/14/21 Appointment Gillis Santa, MD [facility]  Showing future appointments within next 90 days and meeting all other requirements  I discussed the assessment and treatment plan with the patient. The patient was provided an opportunity to ask questions and all were answered. The patient agreed with the plan and demonstrated an understanding of the instructions.  Patient advised to call back or seek an in-person evaluation if the symptoms or condition worsens.  Duration of encounter: 48mnutes.  Note by: BGillis Santa MD Date: 03/21/2021; Time: 10:45 AM

## 2021-03-27 ENCOUNTER — Other Ambulatory Visit: Payer: Self-pay | Admitting: Dermatology

## 2021-03-27 DIAGNOSIS — L409 Psoriasis, unspecified: Secondary | ICD-10-CM

## 2021-03-28 ENCOUNTER — Other Ambulatory Visit: Payer: Self-pay | Admitting: *Deleted

## 2021-03-28 ENCOUNTER — Other Ambulatory Visit: Payer: Self-pay | Admitting: Dermatology

## 2021-03-28 DIAGNOSIS — L409 Psoriasis, unspecified: Secondary | ICD-10-CM

## 2021-03-28 DIAGNOSIS — D649 Anemia, unspecified: Secondary | ICD-10-CM

## 2021-03-28 NOTE — Telephone Encounter (Signed)
Calcipotriene refills sent in/sh

## 2021-04-03 ENCOUNTER — Other Ambulatory Visit: Payer: Self-pay

## 2021-04-03 ENCOUNTER — Inpatient Hospital Stay: Payer: Medicare Other | Attending: Oncology

## 2021-04-03 DIAGNOSIS — M3500 Sicca syndrome, unspecified: Secondary | ICD-10-CM | POA: Diagnosis not present

## 2021-04-03 DIAGNOSIS — N1832 Chronic kidney disease, stage 3b: Secondary | ICD-10-CM

## 2021-04-03 DIAGNOSIS — M069 Rheumatoid arthritis, unspecified: Secondary | ICD-10-CM | POA: Diagnosis not present

## 2021-04-03 DIAGNOSIS — D649 Anemia, unspecified: Secondary | ICD-10-CM | POA: Diagnosis not present

## 2021-04-03 DIAGNOSIS — Z87891 Personal history of nicotine dependence: Secondary | ICD-10-CM | POA: Insufficient documentation

## 2021-04-03 DIAGNOSIS — Z79899 Other long term (current) drug therapy: Secondary | ICD-10-CM | POA: Insufficient documentation

## 2021-04-03 DIAGNOSIS — Z86010 Personal history of colonic polyps: Secondary | ICD-10-CM | POA: Diagnosis not present

## 2021-04-03 DIAGNOSIS — Z8051 Family history of malignant neoplasm of kidney: Secondary | ICD-10-CM | POA: Insufficient documentation

## 2021-04-03 DIAGNOSIS — I129 Hypertensive chronic kidney disease with stage 1 through stage 4 chronic kidney disease, or unspecified chronic kidney disease: Secondary | ICD-10-CM | POA: Diagnosis not present

## 2021-04-03 DIAGNOSIS — D631 Anemia in chronic kidney disease: Secondary | ICD-10-CM | POA: Diagnosis not present

## 2021-04-03 DIAGNOSIS — Z803 Family history of malignant neoplasm of breast: Secondary | ICD-10-CM | POA: Insufficient documentation

## 2021-04-03 LAB — CBC WITH DIFFERENTIAL/PLATELET
Abs Immature Granulocytes: 0.03 10*3/uL (ref 0.00–0.07)
Basophils Absolute: 0 10*3/uL (ref 0.0–0.1)
Basophils Relative: 1 %
Eosinophils Absolute: 0.1 10*3/uL (ref 0.0–0.5)
Eosinophils Relative: 2 %
HCT: 30.6 % — ABNORMAL LOW (ref 36.0–46.0)
Hemoglobin: 10 g/dL — ABNORMAL LOW (ref 12.0–15.0)
Immature Granulocytes: 0 %
Lymphocytes Relative: 18 %
Lymphs Abs: 1.3 10*3/uL (ref 0.7–4.0)
MCH: 29.8 pg (ref 26.0–34.0)
MCHC: 32.7 g/dL (ref 30.0–36.0)
MCV: 91.1 fL (ref 80.0–100.0)
Monocytes Absolute: 0.5 10*3/uL (ref 0.1–1.0)
Monocytes Relative: 6 %
Neutro Abs: 5.2 10*3/uL (ref 1.7–7.7)
Neutrophils Relative %: 73 %
Platelets: 211 10*3/uL (ref 150–400)
RBC: 3.36 MIL/uL — ABNORMAL LOW (ref 3.87–5.11)
RDW: 14 % (ref 11.5–15.5)
WBC: 7.1 10*3/uL (ref 4.0–10.5)
nRBC: 0 % (ref 0.0–0.2)

## 2021-04-03 LAB — IRON AND TIBC
Iron: 66 ug/dL (ref 28–170)
Saturation Ratios: 22 % (ref 10.4–31.8)
TIBC: 305 ug/dL (ref 250–450)
UIBC: 239 ug/dL

## 2021-04-03 LAB — FERRITIN: Ferritin: 69 ng/mL (ref 11–307)

## 2021-04-05 ENCOUNTER — Inpatient Hospital Stay: Payer: Medicare Other

## 2021-04-05 ENCOUNTER — Other Ambulatory Visit: Payer: Self-pay

## 2021-04-05 ENCOUNTER — Inpatient Hospital Stay (HOSPITAL_BASED_OUTPATIENT_CLINIC_OR_DEPARTMENT_OTHER): Payer: Medicare Other | Admitting: Nurse Practitioner

## 2021-04-05 VITALS — BP 123/71 | HR 84 | Temp 97.9°F | Resp 18 | Wt 274.9 lb

## 2021-04-05 VITALS — BP 153/92

## 2021-04-05 DIAGNOSIS — D649 Anemia, unspecified: Secondary | ICD-10-CM

## 2021-04-05 DIAGNOSIS — N1832 Chronic kidney disease, stage 3b: Secondary | ICD-10-CM | POA: Diagnosis not present

## 2021-04-05 DIAGNOSIS — I129 Hypertensive chronic kidney disease with stage 1 through stage 4 chronic kidney disease, or unspecified chronic kidney disease: Secondary | ICD-10-CM | POA: Diagnosis not present

## 2021-04-05 DIAGNOSIS — D631 Anemia in chronic kidney disease: Secondary | ICD-10-CM

## 2021-04-05 DIAGNOSIS — M069 Rheumatoid arthritis, unspecified: Secondary | ICD-10-CM | POA: Diagnosis not present

## 2021-04-05 DIAGNOSIS — Z79899 Other long term (current) drug therapy: Secondary | ICD-10-CM | POA: Diagnosis not present

## 2021-04-05 DIAGNOSIS — N183 Chronic kidney disease, stage 3 unspecified: Secondary | ICD-10-CM

## 2021-04-05 MED ORDER — IRON SUCROSE 20 MG/ML IV SOLN
200.0000 mg | Freq: Once | INTRAVENOUS | Status: AC
  Administered 2021-04-05: 200 mg via INTRAVENOUS
  Filled 2021-04-05: qty 10

## 2021-04-05 MED ORDER — SODIUM CHLORIDE 0.9 % IV SOLN
Freq: Once | INTRAVENOUS | Status: AC
  Filled 2021-04-05: qty 250

## 2021-04-05 MED ORDER — SODIUM CHLORIDE 0.9 % IV SOLN: 200.0000 mg | Freq: Once | INTRAVENOUS | Status: DC

## 2021-04-05 NOTE — Progress Notes (Signed)
Upon removing IV catheter from left arm- noted brusing at insertion site.  Not redness, swelling , warm or cold to touch.  Good blood return from cath prior to removal .  All neurocirculation in tact. Pt states some pain.  Instructed to used warm compresses over weekend.

## 2021-04-05 NOTE — Patient Instructions (Signed)
[facility]  Discharge Instructions: ?Thank you for choosing [facility] to provide your oncology and hematology care.  ?If you have a lab appointment with the Cancer Center, please go directly to the Cancer Center and check in at the registration area. ? ?Wear comfortable clothing and clothing appropriate for easy access to any Portacath or PICC line.  ? ?We strive to give you quality time with your provider. You may need to reschedule your appointment if you arrive late (15 or more minutes).  Arriving late affects you and other patients whose appointments are after yours.  Also, if you miss three or more appointments without notifying the office, you may be dismissed from the clinic at the provider?s discretion.    ?  ?For prescription refill requests, have your pharmacy contact our office and allow 72 hours for refills to be completed.   ? ?Today you received the following chemotherapy and/or immunotherapy agents VENOFER    ?  ?To help prevent nausea and vomiting after your treatment, we encourage you to take your nausea medication as directed. ? ?BELOW ARE SYMPTOMS THAT SHOULD BE REPORTED IMMEDIATELY: ?*FEVER GREATER THAN 100.4 F (38 ?C) OR HIGHER ?*CHILLS OR SWEATING ?*NAUSEA AND VOMITING THAT IS NOT CONTROLLED WITH YOUR NAUSEA MEDICATION ?*UNUSUAL SHORTNESS OF BREATH ?*UNUSUAL BRUISING OR BLEEDING ?*URINARY PROBLEMS (pain or burning when urinating, or frequent urination) ?*BOWEL PROBLEMS (unusual diarrhea, constipation, pain near the anus) ?TENDERNESS IN MOUTH AND THROAT WITH OR WITHOUT PRESENCE OF ULCERS (sore throat, sores in mouth, or a toothache) ?UNUSUAL RASH, SWELLING OR PAIN  ?UNUSUAL VAGINAL DISCHARGE OR ITCHING  ? ?Items with * indicate a potential emergency and should be followed up as soon as possible or go to the Emergency Department if any problems should occur. ? ?Please show the CHEMOTHERAPY ALERT CARD or IMMUNOTHERAPY ALERT CARD at check-in to the  Emergency Department and triage nurse. ? ?Should you have questions after your visit or need to cancel or reschedule your appointment, please contact [facility]  [phone] and follow the prompts.  Office hours are 8:00 a.m. to 4:30 p.m. Monday - Friday. Please note that voicemails left after 4:00 p.m. may not be returned until the following business day.  We are closed weekends and major holidays. You have access to a nurse at all times for urgent questions. Please call the main number to the clinic [phone] and follow the prompts. ? ?For any non-urgent questions, you may also contact your provider using MyChart. We now offer e-Visits for anyone 18 and older to request care online for non-urgent symptoms. For details visit [email or web address]. ?  ?Also download the MyChart app! Go to the app store, search "MyChart", open the app, select [facility], and log in with your MyChart username and password. ? ?Due to Covid, a mask is required upon entering the hospital/clinic. If you do not have a mask, one will be given to you upon arrival. For doctor visits, patients may have 1 support person aged 18 or older with them. For treatment visits, patients cannot have anyone with them due to current Covid guidelines and our immunocompromised population.  ? ?Iron Sucrose Injection ?What is this medication? ?IRON SUCROSE (EYE ern SOO krose) treats low levels of iron (iron deficiency anemia) in people with kidney disease. Iron is a mineral that plays an important role in making red blood cells, which carry oxygen from your lungs to the rest of your body. ?This medicine may   be used for other purposes; ask your health care provider or pharmacist if you have questions. ?COMMON BRAND NAME(S): Venofer ?What should I tell my care team before I take this medication? ?They need to know if you have any of these conditions: ?Anemia not caused by low iron levels ?Heart disease ?High levels of  iron in the blood ?Kidney disease ?Liver disease ?An unusual or allergic reaction to iron, other medications, foods, dyes, or preservatives ?Pregnant or trying to get pregnant ?Breast-feeding ?How should I use this medication? ?This medication is for infusion into a vein. It is given in a hospital or clinic setting. ?Talk to your care team about the use of this medication in children. While this medication may be prescribed for children as young as 2 years for selected conditions, precautions do apply. ?Overdosage: If you think you have taken too much of this medicine contact a poison control center or emergency room at once. ?NOTE: This medicine is only for you. Do not share this medicine with others. ?What if I miss a dose? ?It is important not to miss your dose. Call your care team if you are unable to keep an appointment. ?What may interact with this medication? ?Do not take this medication with any of the following: ?Deferoxamine ?Dimercaprol ?Other iron products ?This medication may also interact with the following: ?Chloramphenicol ?Deferasirox ?This list may not describe all possible interactions. Give your health care provider a list of all the medicines, herbs, non-prescription drugs, or dietary supplements you use. Also tell them if you smoke, drink alcohol, or use illegal drugs. Some items may interact with your medicine. ?What should I watch for while using this medication? ?Visit your care team regularly. Tell your care team if your symptoms do not start to get better or if they get worse. You may need blood work done while you are taking this medication. ?You may need to follow a special diet. Talk to your care team. Foods that contain iron include: whole grains/cereals, dried fruits, beans, or peas, leafy green vegetables, and organ meats (liver, kidney). ?What side effects may I notice from receiving this medication? ?Side effects that you should report to your care team as soon as  possible: ?Allergic reactions--skin rash, itching, hives, swelling of the face, lips, tongue, or throat ?Low blood pressure--dizziness, feeling faint or lightheaded, blurry vision ?Shortness of breath ?Side effects that usually do not require medical attention (report to your care team if they continue or are bothersome): ?Flushing ?Headache ?Joint pain ?Muscle pain ?Nausea ?Pain, redness, or irritation at injection site ?This list may not describe all possible side effects. Call your doctor for medical advice about side effects. You may report side effects to FDA at 1-800-FDA-1088. ?Where should I keep my medication? ?This medication is given in a hospital or clinic and will not be stored at home. ?NOTE: This sheet is a summary. It may not cover all possible information. If you have questions about this medicine, talk to your doctor, pharmacist, or health care provider. ?? 2022 Elsevier/Gold Standard (2020-07-06 00:00:00) ? ?

## 2021-04-05 NOTE — Progress Notes (Signed)
Hematology/Oncology Consult note [phone] [phone]    Clinic Day:  04/05/2021  Referring physician: Juline Patch, MD  Chief Complaint: Michele Moore is a 71 y.o. female presents for anemia in stage IIIb chronic kidney disease   Michele Moore is a 71 y.o.afemale who has above oncology history reviewed by me today presented for follow up visit for management of anemia in CKD  PERTINENT HEMATOLOGY HISTORY Patient previously followed up by Dr.Corcoran, patient switched care to me on 08/30/20 Extensive medical record review was performed by me  # anemia in stage IIIb chronic kidney disease.  Work-up on 04/23/2020 revealed a hematocrit of 35.0, hemoglobin 11.4, MCV 92.3, platelets 168,000, WBC 4,600 with an ANC of 3000. Ferritin was 107 with an iron saturation of 37% and a TIBC of 315. Sed rate was 54 and CRP was 0.9.  Vitamin B12 was 429 and folate >100.0.   10/22/2017 Colonoscopy for heme positive stool revealed one 7 mm polyp in the ascending colon, one 3 mm polyp in the transverse colon, and three 1 to 4 mm polyps in the sigmoid colon. There were non-bleeding internal hemorrhoids. There were petechia(e) in the rectum. Pathology showed two tubular adenomas and one hyperplastic polyp. EGD on 10/22/2017 showed gastritis and one gastric polyp.  She has Sjogren's syndrome, rheumatoid arthritis, hypertension, and proteinuria.  She takes oral iron once a day.  INTERVAL HISTORY Michele Moore is a 71 y.o. female who has above history reviewed by me today presents for follow up visit for anemia secondary to chronic kidney disease. Chronic fatigue unchanged.    Past Medical History:  Diagnosis Date   Allergy    Anemia    Arthritis    Asthma    Cataract    Chronic kidney disease    STAGE 3 PER DR EASON 08/02/15   Collagen vascular disease ([facility])    Complication of anesthesia    WOKE UP DURING COLONOSCOPY   Dyspnea    Dysrhythmia     IRREGULAR HEART BEAT   Family history of adverse reaction to anesthesia    sister difficult to put to sleep   GERD (gastroesophageal reflux disease)    Glaucoma    H/O tooth extraction    all lower teeth 1/19   Hemorrhoid    History of hiatal hernia    History of kidney stones    H/O   Hypertension    Hypothyroidism    Migraines    Mixed hyperlipidemia    Multiple gastric ulcers    Thyroid disease    Wears dentures    partial upper and lower    Past Surgical History:  Procedure Laterality Date   ANKLE SURGERY     CARPAL TUNNEL RELEASE     x3   COLONOSCOPY  2000?   COLONOSCOPY WITH PROPOFOL N/A 10/22/2017   Procedure: COLONOSCOPY WITH PROPOFOL;  Surgeon: Lucilla Lame, MD;  Location: [facility];  Service: Endoscopy;  Laterality: N/A;   ESOPHAGOGASTRODUODENOSCOPY (EGD) WITH PROPOFOL N/A 10/22/2017   Procedure: ESOPHAGOGASTRODUODENOSCOPY (EGD) WITH PROPOFOL;  Surgeon: Lucilla Lame, MD;  Location: [facility];  Service: Endoscopy;  Laterality: N/A;   FUSION OF TALONAVICULAR JOINT Right 08/03/2015   Procedure: TAILOR NAVICULAR JOINT FUSION - RIGHT ;  Surgeon: [name], DPM;  Location: [facility];  Service: Podiatry;  Laterality: Right;   JOINT REPLACEMENT Right    knee x 3 ,right x1 and left x2   POLYPECTOMY N/A 10/22/2017  Procedure: POLYPECTOMY;  Surgeon: Lucilla Lame, MD;  Location: [facility];  Service: Endoscopy;  Laterality: N/A;   ROTATOR CUFF REPAIR     TONSILLECTOMY     TOTAL KNEE REVISION Right 01/20/2018   Procedure: POLYETHYLENE EXCHANGE;  Surgeon: Dereck Leep, MD;  Location: [facility];  Service: Orthopedics;  Laterality: Right;   TUBAL LIGATION     UPPER GI ENDOSCOPY  2000?    Family History  Problem Relation Age of Onset   Heart failure Mother    Gout Mother    Arthritis Mother    Hypertension Mother    Stroke Mother    Heart failure Father    Diabetes Father    Hyperlipidemia Father    COPD Sister    Cancer Maternal Aunt         breast   Cancer Maternal Uncle        kidney   Breast cancer Neg Hx     Social History:  reports that she quit smoking about 28 years ago. Her smoking use included cigarettes. She has a 40.00 pack-year smoking history. She has never used smokeless tobacco. She reports that she does not drink alcohol and does not use drugs. She denies current alcohol or tobacco use. She quit smoking remotely. She denies exposure to radiation or toxins. She retired at age 37. 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   Allergies:  Allergies  Allergen Reactions   Ampicillin Swelling   Penicillins Anaphylaxis and Swelling    IgE = 10 (11/05/2017)  Has patient had a PCN reaction causing immediate rash, facial/tongue/throat swelling, SOB or lightheadedness with hypotension: Yes Has patient had a PCN reaction causing severe rash involving mucus membranes or skin necrosis: No Has patient had a PCN reaction that required hospitalization: No Has patient had a PCN reaction occurring within the last 10 years: Yes If all of the above answers are "NO", then may proceed with Cephalosporin use.   Vancomycin     Other reaction(s): Other (see comments)   Vibramycin [Doxycycline Calcium] Rash    Current Medications: Current Outpatient Medications  Medication Sig Dispense Refill   albuterol (VENTOLIN HFA) 108 (90 Base) MCG/ACT inhaler INHALE 2 PUFFS INTO THE LUNGS EVERY 4 HOURS AS NEEDED FOR WHEEZING 18 g 0   allopurinol (ZYLOPRIM) 100 MG tablet Take 200 mg by mouth every morning.      aspirin EC 81 MG tablet Take 81 mg by mouth daily. Swallow whole.     betamethasone dipropionate 0.05 % cream APPLY TO PSORIASIS ON HANDS TWICE DAILY ONLY. AVOID FACE, GROIN AND AXILLA 45 g 0   betamethasone valerate (VALISONE) 0.1 % cream Apply topically 2 (two) times daily as needed (Rash). 45 g 3   calcipotriene (DOVONOX) 0.005 % cream APPLY TOPICALLY TO PSORIASIS AREAS ON LEGS AND FEET ONCE OR TWICE DAILY 540 g 1   Cholecalciferol  (VITAMIN D3) 2000 units TABS Take 2,000 Units by mouth daily.     diclofenac sodium (VOLTAREN) 1 % GEL Apply 2 g topically 3 (three) times daily.      ferrous sulfate 325 (65 FE) MG tablet Take 325 mg by mouth daily with breakfast.      gabapentin (NEURONTIN) 600 MG tablet Take 1 tablet (600 mg total) by mouth at bedtime. 30 tablet 5   hydrALAZINE (APRESOLINE) 50 MG tablet Take 1 tablet by mouth twice daily 180 tablet 1   HYDROcodone-acetaminophen (NORCO) 7.5-325 MG tablet Take 1 tablet by mouth every 8 (  eight) hours as needed for severe pain. Must last 30 days. 90 tablet 0   [START ON 04/24/2021] HYDROcodone-acetaminophen (NORCO) 7.5-325 MG tablet Take 1 tablet by mouth every 8 (eight) hours as needed for severe pain. Must last 30 days. 90 tablet 0   ketoconazole (NIZORAL) 2 % cream Apply topically daily as needed. 15 g 2   levothyroxine (SYNTHROID) 175 MCG tablet TAKE 1 TABLET BY MOUTH EVERY DAY 90 tablet 1   losartan (COZAAR) 25 MG tablet Take 1 tablet (25 mg total) by mouth every morning. 90 tablet 1   meclizine (ANTIVERT) 12.5 MG tablet TAKE 1 TABLET(12.5 MG) BY MOUTH THREE TIMES DAILY AS NEEDED FOR DIZZINESS 30 tablet 0   methotrexate (RHEUMATREX) 2.5 MG tablet Take 7.5 mg by mouth every Friday. Take 5 tablets by mouth once weekly     montelukast (SINGULAIR) 10 MG tablet TAKE 1 TABLET(10 MG) BY MOUTH AT BEDTIME 90 tablet 1   Multiple Vitamins-Calcium (ONE-A-DAY WOMENS FORMULA PO) Take 1 tablet by mouth daily.     mupirocin ointment (BACTROBAN) 2 % Apply 1 application topically 2 (two) times daily. 22 g 0   omega-3 acid ethyl esters (LOVAZA) 1 g capsule Take 1 capsule (1 g total) by mouth 2 (two) times daily. 180 capsule 1   omeprazole (PRILOSEC) 40 MG capsule TAKE 1 CAPSULE(40 MG) BY MOUTH DAILY 90 capsule 1   OTEZLA 30 MG TABS Take 1 tablet by mouth daily as needed.     Polyethyl Glycol-Propyl Glycol (SYSTANE OP) Place 1 drop into both eyes daily as needed (dry eyes).      potassium chloride  SA (KLOR-CON M) 20 MEQ tablet Take 0.5 tablets (10 mEq total) by mouth daily. 45 tablet 1   tiZANidine (ZANAFLEX) 4 MG tablet Take 1 tablet (4 mg total) by mouth at bedtime. 60 tablet 2   triamcinolone (KENALOG) 0.1 % Apply 1 application topically 2 (two) times daily. 30 g 0   triamterene-hydrochlorothiazide (MAXZIDE) 75-50 MG tablet Take 1 tablet by mouth daily. 90 tablet 1   HYDROcodone-acetaminophen (NORCO) 7.5-325 MG tablet Take 1 tablet by mouth every 8 (eight) hours as needed for severe pain. Must last 30 days. 90 tablet 0   No current facility-administered medications for this visit.    Review of Systems  Constitutional:  Negative for chills, fever, malaise/fatigue and weight loss.  HENT:  Negative for hearing loss, nosebleeds, sore throat and tinnitus.   Eyes:  Negative for blurred vision and double vision.  Respiratory:  Negative for cough, hemoptysis, shortness of breath and wheezing.   Cardiovascular:  Negative for chest pain, palpitations and leg swelling.  Gastrointestinal:  Negative for abdominal pain, blood in stool, constipation, diarrhea, melena, nausea and vomiting.  Genitourinary:  Negative for dysuria and urgency.  Musculoskeletal:  Negative for back pain, falls, joint pain and myalgias.  Skin:  Negative for itching and rash.  Neurological:  Negative for dizziness, tingling, sensory change, loss of consciousness, weakness and headaches.  Endo/Heme/Allergies:  Negative for environmental allergies. Does not bruise/bleed easily.  Psychiatric/Behavioral:  Negative for depression. The patient is not nervous/anxious and does not have insomnia.    Performance status (ECOG): 2  Vitals Blood pressure 123/71, pulse 84, temperature 97.9 F (36.6 C), resp. rate 18, weight 274 lb 14.4 oz (124.7 kg), SpO2 100 %.   Physical Exam Vitals reviewed.  Constitutional:      Appearance: She is obese.  Eyes:     General: No scleral icterus.    Conjunctiva/sclera:  Conjunctivae normal.   Musculoskeletal:     Comments: 4 wheel rolling walker  Neurological:     Mental Status: She is alert and oriented to person, place, and time.  Psychiatric:        Mood and Affect: Mood normal.        Behavior: Behavior normal.   Appointment on 04/03/2021  Component Date Value Ref Range Status   Iron 04/03/2021 66  28 - 170 ug/dL Final   TIBC 04/03/2021 305  250 - 450 ug/dL Final   Saturation Ratios 04/03/2021 22  10.4 - 31.8 % Final   UIBC 04/03/2021 239  ug/dL Final   Performed at [facility], [address] [place], [place] [address]   Ferritin 04/03/2021 69  11 - 307 ng/mL Final   Performed at [facility], [address] [place], [place] [address]   WBC 04/03/2021 7.1  4.0 - 10.5 K/uL Final   RBC 04/03/2021 3.36 (L)  3.87 - 5.11 MIL/uL Final   Hemoglobin 04/03/2021 10.0 (L)  12.0 - 15.0 g/dL Final   HCT 04/03/2021 30.6 (L)  36.0 - 46.0 % Final   MCV 04/03/2021 91.1  80.0 - 100.0 fL Final   MCH 04/03/2021 29.8  26.0 - 34.0 pg Final   MCHC 04/03/2021 32.7  30.0 - 36.0 g/dL Final   RDW 04/03/2021 14.0  11.5 - 15.5 % Final   Platelets 04/03/2021 211  150 - 400 K/uL Final   nRBC 04/03/2021 0.0  0.0 - 0.2 % Final   Neutrophils Relative % 04/03/2021 73  % Final   Neutro Abs 04/03/2021 5.2  1.7 - 7.7 K/uL Final   Lymphocytes Relative 04/03/2021 18  % Final   Lymphs Abs 04/03/2021 1.3  0.7 - 4.0 K/uL Final   Monocytes Relative 04/03/2021 6  % Final   Monocytes Absolute 04/03/2021 0.5  0.1 - 1.0 K/uL Final   Eosinophils Relative 04/03/2021 2  % Final   Eosinophils Absolute 04/03/2021 0.1  0.0 - 0.5 K/uL Final   Basophils Relative 04/03/2021 1  % Final   Basophils Absolute 04/03/2021 0.0  0.0 - 0.1 K/uL Final   Immature Granulocytes 04/03/2021 0  % Final   Abs Immature Granulocytes 04/03/2021 0.03  0.00 - 0.07 K/uL Final   Performed at [facility], [address] [place], [place] [address]   Iron/TIBC/Ferritin/ %Sat    Component Value Date/Time    IRON 66 04/03/2021 1357   TIBC 305 04/03/2021 1357   FERRITIN 69 04/03/2021 1357   IRONPCTSAT 22 04/03/2021 1357     Assessment and Plan. No diagnosis found.  Anemia of Chronic Kidney Disease- hmg 10. Stable to slightly reduced. Ferritin 69. Iron sat 22%. Reviewed that in the context of CKD, goal ferritin around 200. She's not yet started retacrit as hb has been > 10. I suspect with additional iron, she will not yet need retacrit but can re-evaluate in the future.   #Rheumatoid arthritis on methotrexate.  Continue folic acid 3 days/week.  Folate is normal.  #Chronic kidney disease, avoid nephrotoxin.  Encourage oral hydration.  Plan for venofer x 3.  3 mo- lab Day to week later see MD.   Thank you for allowing me to participate in the care of this patient.   Beckey Rutter, DNP, AGNP-C [facility] [phone] (clinic) 04/05/2021

## 2021-04-10 ENCOUNTER — Inpatient Hospital Stay: Payer: Medicare Other

## 2021-04-10 DIAGNOSIS — L400 Psoriasis vulgaris: Secondary | ICD-10-CM | POA: Diagnosis not present

## 2021-04-10 DIAGNOSIS — L818 Other specified disorders of pigmentation: Secondary | ICD-10-CM | POA: Diagnosis not present

## 2021-04-10 DIAGNOSIS — Z79899 Other long term (current) drug therapy: Secondary | ICD-10-CM | POA: Diagnosis not present

## 2021-04-11 ENCOUNTER — Inpatient Hospital Stay: Payer: Medicare Other

## 2021-04-11 ENCOUNTER — Other Ambulatory Visit: Payer: Self-pay

## 2021-04-11 VITALS — BP 130/75 | HR 75 | Temp 98.7°F | Resp 22

## 2021-04-11 DIAGNOSIS — N1832 Chronic kidney disease, stage 3b: Secondary | ICD-10-CM | POA: Diagnosis not present

## 2021-04-11 DIAGNOSIS — D649 Anemia, unspecified: Secondary | ICD-10-CM

## 2021-04-11 DIAGNOSIS — N183 Chronic kidney disease, stage 3 unspecified: Secondary | ICD-10-CM

## 2021-04-11 DIAGNOSIS — Z79899 Other long term (current) drug therapy: Secondary | ICD-10-CM | POA: Diagnosis not present

## 2021-04-11 DIAGNOSIS — D631 Anemia in chronic kidney disease: Secondary | ICD-10-CM | POA: Diagnosis not present

## 2021-04-11 DIAGNOSIS — M069 Rheumatoid arthritis, unspecified: Secondary | ICD-10-CM | POA: Diagnosis not present

## 2021-04-11 DIAGNOSIS — I129 Hypertensive chronic kidney disease with stage 1 through stage 4 chronic kidney disease, or unspecified chronic kidney disease: Secondary | ICD-10-CM | POA: Diagnosis not present

## 2021-04-11 MED ORDER — SODIUM CHLORIDE 0.9 % IV SOLN
Freq: Once | INTRAVENOUS | Status: AC
  Filled 2021-04-11: qty 250

## 2021-04-11 MED ORDER — IRON SUCROSE 20 MG/ML IV SOLN
200.0000 mg | Freq: Once | INTRAVENOUS | Status: AC
  Administered 2021-04-11: 200 mg via INTRAVENOUS
  Filled 2021-04-11: qty 10

## 2021-04-11 MED ORDER — SODIUM CHLORIDE 0.9 % IV SOLN: 200.0000 mg | Freq: Once | INTRAVENOUS | Status: DC

## 2021-04-11 NOTE — Patient Instructions (Signed)
[facility]  Discharge Instructions: ?Thank you for choosing [facility] to provide your oncology and hematology care.  ?If you have a lab appointment with the Cancer Center, please go directly to the Cancer Center and check in at the registration area. ? ?Wear comfortable clothing and clothing appropriate for easy access to any Portacath or PICC line.  ? ?We strive to give you quality time with your provider. You may need to reschedule your appointment if you arrive late (15 or more minutes).  Arriving late affects you and other patients whose appointments are after yours.  Also, if you miss three or more appointments without notifying the office, you may be dismissed from the clinic at the provider?s discretion.    ?  ?For prescription refill requests, have your pharmacy contact our office and allow 72 hours for refills to be completed.   ? ?Today you received the following chemotherapy and/or immunotherapy agents VENOFER    ?  ?To help prevent nausea and vomiting after your treatment, we encourage you to take your nausea medication as directed. ? ?BELOW ARE SYMPTOMS THAT SHOULD BE REPORTED IMMEDIATELY: ?*FEVER GREATER THAN 100.4 F (38 ?C) OR HIGHER ?*CHILLS OR SWEATING ?*NAUSEA AND VOMITING THAT IS NOT CONTROLLED WITH YOUR NAUSEA MEDICATION ?*UNUSUAL SHORTNESS OF BREATH ?*UNUSUAL BRUISING OR BLEEDING ?*URINARY PROBLEMS (pain or burning when urinating, or frequent urination) ?*BOWEL PROBLEMS (unusual diarrhea, constipation, pain near the anus) ?TENDERNESS IN MOUTH AND THROAT WITH OR WITHOUT PRESENCE OF ULCERS (sore throat, sores in mouth, or a toothache) ?UNUSUAL RASH, SWELLING OR PAIN  ?UNUSUAL VAGINAL DISCHARGE OR ITCHING  ? ?Items with * indicate a potential emergency and should be followed up as soon as possible or go to the Emergency Department if any problems should occur. ? ?Please show the CHEMOTHERAPY ALERT CARD or IMMUNOTHERAPY ALERT CARD at check-in to the  Emergency Department and triage nurse. ? ?Should you have questions after your visit or need to cancel or reschedule your appointment, please contact [facility]  [phone] and follow the prompts.  Office hours are 8:00 a.m. to 4:30 p.m. Monday - Friday. Please note that voicemails left after 4:00 p.m. may not be returned until the following business day.  We are closed weekends and major holidays. You have access to a nurse at all times for urgent questions. Please call the main number to the clinic [phone] and follow the prompts. ? ?For any non-urgent questions, you may also contact your provider using MyChart. We now offer e-Visits for anyone 18 and older to request care online for non-urgent symptoms. For details visit [email or web address]. ?  ?Also download the MyChart app! Go to the app store, search "MyChart", open the app, select [facility], and log in with your MyChart username and password. ? ?Due to Covid, a mask is required upon entering the hospital/clinic. If you do not have a mask, one will be given to you upon arrival. For doctor visits, patients may have 1 support person aged 18 or older with them. For treatment visits, patients cannot have anyone with them due to current Covid guidelines and our immunocompromised population.  ? ?Iron Sucrose Injection ?What is this medication? ?IRON SUCROSE (EYE ern SOO krose) treats low levels of iron (iron deficiency anemia) in people with kidney disease. Iron is a mineral that plays an important role in making red blood cells, which carry oxygen from your lungs to the rest of your body. ?This medicine may   be used for other purposes; ask your health care provider or pharmacist if you have questions. ?COMMON BRAND NAME(S): Venofer ?What should I tell my care team before I take this medication? ?They need to know if you have any of these conditions: ?Anemia not caused by low iron levels ?Heart disease ?High levels of  iron in the blood ?Kidney disease ?Liver disease ?An unusual or allergic reaction to iron, other medications, foods, dyes, or preservatives ?Pregnant or trying to get pregnant ?Breast-feeding ?How should I use this medication? ?This medication is for infusion into a vein. It is given in a hospital or clinic setting. ?Talk to your care team about the use of this medication in children. While this medication may be prescribed for children as young as 2 years for selected conditions, precautions do apply. ?Overdosage: If you think you have taken too much of this medicine contact a poison control center or emergency room at once. ?NOTE: This medicine is only for you. Do not share this medicine with others. ?What if I miss a dose? ?It is important not to miss your dose. Call your care team if you are unable to keep an appointment. ?What may interact with this medication? ?Do not take this medication with any of the following: ?Deferoxamine ?Dimercaprol ?Other iron products ?This medication may also interact with the following: ?Chloramphenicol ?Deferasirox ?This list may not describe all possible interactions. Give your health care provider a list of all the medicines, herbs, non-prescription drugs, or dietary supplements you use. Also tell them if you smoke, drink alcohol, or use illegal drugs. Some items may interact with your medicine. ?What should I watch for while using this medication? ?Visit your care team regularly. Tell your care team if your symptoms do not start to get better or if they get worse. You may need blood work done while you are taking this medication. ?You may need to follow a special diet. Talk to your care team. Foods that contain iron include: whole grains/cereals, dried fruits, beans, or peas, leafy green vegetables, and organ meats (liver, kidney). ?What side effects may I notice from receiving this medication? ?Side effects that you should report to your care team as soon as  possible: ?Allergic reactions--skin rash, itching, hives, swelling of the face, lips, tongue, or throat ?Low blood pressure--dizziness, feeling faint or lightheaded, blurry vision ?Shortness of breath ?Side effects that usually do not require medical attention (report to your care team if they continue or are bothersome): ?Flushing ?Headache ?Joint pain ?Muscle pain ?Nausea ?Pain, redness, or irritation at injection site ?This list may not describe all possible side effects. Call your doctor for medical advice about side effects. You may report side effects to FDA at 1-800-FDA-1088. ?Where should I keep my medication? ?This medication is given in a hospital or clinic and will not be stored at home. ?NOTE: This sheet is a summary. It may not cover all possible information. If you have questions about this medicine, talk to your doctor, pharmacist, or health care provider. ?? 2022 [employer] (2020-07-06 00:00:00) ? ?

## 2021-04-11 NOTE — Progress Notes (Signed)
Pt back for infusion. Left antecubital assessed by myself and Haywood Pao RN.  Noted to have edema and bruising. Was made aware that when pt was at door to leave on Friday to leave site did bleed and required pressure and ice to stop bleeding.  Today it appears like it is all old blood under skin. Explained to pt that body will reabsorb it. Verbalized understanding.  Pt did say a doctor told her it is Iron under the skin We discussed with pt that it appears to be more old blood based on appearance and color.  Warm compress applied.  Neuro circulation is all in tact with no deficits.

## 2021-04-12 ENCOUNTER — Other Ambulatory Visit: Payer: Self-pay

## 2021-04-12 DIAGNOSIS — E039 Hypothyroidism, unspecified: Secondary | ICD-10-CM

## 2021-04-12 MED ORDER — LEVOTHYROXINE SODIUM 175 MCG PO TABS: 175.0000 ug | ORAL_TABLET | Freq: Every day | ORAL | 0 refills | Status: DC

## 2021-04-15 ENCOUNTER — Inpatient Hospital Stay: Payer: Medicare Other

## 2021-04-15 DIAGNOSIS — R768 Other specified abnormal immunological findings in serum: Secondary | ICD-10-CM | POA: Diagnosis not present

## 2021-04-15 DIAGNOSIS — L4050 Arthropathic psoriasis, unspecified: Secondary | ICD-10-CM | POA: Diagnosis not present

## 2021-04-15 DIAGNOSIS — M1A00X0 Idiopathic chronic gout, unspecified site, without tophus (tophi): Secondary | ICD-10-CM | POA: Diagnosis not present

## 2021-04-15 DIAGNOSIS — L409 Psoriasis, unspecified: Secondary | ICD-10-CM | POA: Diagnosis not present

## 2021-04-15 DIAGNOSIS — Z79899 Other long term (current) drug therapy: Secondary | ICD-10-CM | POA: Diagnosis not present

## 2021-04-19 ENCOUNTER — Inpatient Hospital Stay: Payer: Medicare Other

## 2021-04-19 ENCOUNTER — Other Ambulatory Visit: Payer: Self-pay

## 2021-04-19 VITALS — BP 125/61 | HR 79 | Temp 97.2°F | Resp 18

## 2021-04-19 DIAGNOSIS — Z79899 Other long term (current) drug therapy: Secondary | ICD-10-CM | POA: Diagnosis not present

## 2021-04-19 DIAGNOSIS — D649 Anemia, unspecified: Secondary | ICD-10-CM | POA: Diagnosis not present

## 2021-04-19 DIAGNOSIS — N1832 Chronic kidney disease, stage 3b: Secondary | ICD-10-CM | POA: Diagnosis not present

## 2021-04-19 DIAGNOSIS — I129 Hypertensive chronic kidney disease with stage 1 through stage 4 chronic kidney disease, or unspecified chronic kidney disease: Secondary | ICD-10-CM | POA: Diagnosis not present

## 2021-04-19 DIAGNOSIS — M069 Rheumatoid arthritis, unspecified: Secondary | ICD-10-CM | POA: Diagnosis not present

## 2021-04-19 DIAGNOSIS — D631 Anemia in chronic kidney disease: Secondary | ICD-10-CM | POA: Diagnosis not present

## 2021-04-19 DIAGNOSIS — N183 Chronic kidney disease, stage 3 unspecified: Secondary | ICD-10-CM

## 2021-04-19 MED ORDER — SODIUM CHLORIDE 0.9 % IV SOLN
Freq: Once | INTRAVENOUS | Status: AC
  Filled 2021-04-19: qty 250

## 2021-04-19 MED ORDER — IRON SUCROSE 20 MG/ML IV SOLN
200.0000 mg | Freq: Once | INTRAVENOUS | Status: AC
  Administered 2021-04-19: 200 mg via INTRAVENOUS
  Filled 2021-04-19: qty 10

## 2021-04-19 MED ORDER — SODIUM CHLORIDE 0.9 % IV SOLN: 200.0000 mg | Freq: Once | INTRAVENOUS | Status: DC

## 2021-04-19 NOTE — Progress Notes (Signed)
1312- Patient is here for Venofer infusion only today. Patient reports she twisted her left ankle last week getting off the transportation bus and heard a pop. Patient reports increased pain and edema to left ankle and foot. Patient rates pain level of 10 on a 0-10 pain scale. Patient stated she has called to get an appointment with her foot doctor, however, they cannot see her until 04/30/2021. She has not contacted her primary care physician. MD, Dr. Tasia Catchings, and NP, Beckey Rutter, notified.  1321- Per NP, Beckey Rutter, order: Proceed with scheduled Venofer infusion today and then recommend patient to go to Emergency Department for further evaluation of left ankle and foot. Patient informed and verbalized understanding. Patient does not want to go to the Emergency Department and reports she will have her friend take her to urgent care today.

## 2021-04-22 ENCOUNTER — Ambulatory Visit (INDEPENDENT_AMBULATORY_CARE_PROVIDER_SITE_OTHER): Payer: Medicare Other

## 2021-04-22 ENCOUNTER — Other Ambulatory Visit: Payer: Self-pay

## 2021-04-22 ENCOUNTER — Ambulatory Visit
Admission: EM | Admit: 2021-04-22 | Discharge: 2021-04-22 | Disposition: A | Discharge status: Home / Self Care | Payer: Medicare Other | Attending: Physician Assistant | Admitting: Physician Assistant

## 2021-04-22 DIAGNOSIS — M7989 Other specified soft tissue disorders: Secondary | ICD-10-CM

## 2021-04-22 DIAGNOSIS — S93402A Sprain of unspecified ligament of left ankle, initial encounter: Secondary | ICD-10-CM | POA: Diagnosis not present

## 2021-04-22 DIAGNOSIS — W19XXXA Unspecified fall, initial encounter: Secondary | ICD-10-CM | POA: Diagnosis not present

## 2021-04-22 DIAGNOSIS — M25572 Pain in left ankle and joints of left foot: Secondary | ICD-10-CM

## 2021-04-22 DIAGNOSIS — M79672 Pain in left foot: Secondary | ICD-10-CM | POA: Diagnosis not present

## 2021-04-22 NOTE — ED Triage Notes (Signed)
Pt c/o left ankle pain. Pt was stepping off of a bus and twisted her ankle on 04/12/21 and heard a pop. Pt states that she was told to come here from the cancer center.   Pt states that the pain has gotten worse from 04/12/21. Pt has issues walking and standing and states that she can not put pressure on it.

## 2021-04-22 NOTE — ED Provider Notes (Signed)
[facility]    CSN: [id number] Arrival date & time: 04/22/21  2841      History   Chief Complaint Chief Complaint  Patient presents with   Ankle Pain    HPI Michele Moore is a 71 y.o. female presenting for approximately 1.5-week history of left ankle/foot pain and swelling.  Patient reports on 04/12/2021 she accidentally twisted her ankle stepping off a bus.  Reports that she heard a pop at that time.  Has a history of psoriatic arthritis, osteoarthritis, collagen vascular disease, and gout.  Patient has had multiple joint replacements due to her degenerative conditions.  She also takes opioids as needed for severe pain.  Follows up with pain management and orthopedics.  Patient presently concerned about possible fracture of foot or ankle due to the continued swelling and pain.  Patient reports using a walker and sometimes a cane.  She has had to use these devices before the injury.  No associated numbness or tingling.  Patient's other medical history significant for hypertension, hypothyroidism, migraines, hyperlipidemia, GERD, anemia, asthma, CKD stage III.  HPI  Past Medical History:  Diagnosis Date   Allergy    Anemia    Arthritis    Asthma    Cataract    Chronic kidney disease    STAGE 3 PER DR EASON 08/02/15   Collagen vascular disease ([place])    Complication of anesthesia    WOKE UP DURING COLONOSCOPY   Dyspnea    Dysrhythmia    IRREGULAR HEART BEAT   Family history of adverse reaction to anesthesia    sister difficult to put to sleep   GERD (gastroesophageal reflux disease)    Glaucoma    H/O tooth extraction    all lower teeth 1/19   Hemorrhoid    History of hiatal hernia    History of kidney stones    H/O   Hypertension    Hypothyroidism    Migraines    Mixed hyperlipidemia    Multiple gastric ulcers    Thyroid disease    Wears dentures    partial upper and lower    Patient Active Problem List   Diagnosis Date Noted   Chronic instability of  left knee 02/12/2021   Long term methotrexate user 01/03/2021   Stage 3b chronic kidney disease ([place]) 01/03/2021   Piriformis syndrome of left side 11/15/2020   Normocytic anemia 04/22/2020   Lumbar spondylosis 02/16/2020   Arthritis of left knee 11/24/2019   Left hip pain 08/08/2019   Primary osteoarthritis of left hip 08/08/2019   Non-seasonal allergic rhinitis 04/12/2019   Bursitis of left shoulder 03/24/2019   Benign essential hypertension 03/06/2019   Proteinuria 03/06/2019   Trochanteric bursitis of left hip 10/04/2018   SI joint arthritis (left) 10/04/2018   Exertional chest pain 09/30/2018   CKD (chronic kidney disease), stage III ([place]) 09/30/2018   Psoriatic arthritis ([place]) 04/23/2018   Trigger ring finger of left hand 04/14/2018   Female pelvic pain 01/20/2018   History of revision of total knee arthroplasty 01/20/2018   Iron deficiency anemia    Heme + stool    Acute gastritis without hemorrhage    Gastric polyps    Benign neoplasm of ascending colon    Benign neoplasm of transverse colon    Polyp of sigmoid colon    Hypertension 10/01/2017   Hypothyroidism 10/01/2017   Gastroesophageal reflux disease 10/01/2017   Hypokalemia 10/01/2017   Mixed hyperlipidemia 10/01/2017   Reactive airway disease 10/01/2017  Bradycardia 08/24/2017   Severe obesity (BMI 35.0-35.9 with comorbidity) ([place]) 08/18/2017   Chronic gouty arthropathy without tophi 06/25/2017   Encounter for long-term (current) use of high-risk medication 06/25/2017   Positive ANA (antinuclear antibody) 06/17/2017   Cervical facet syndrome 06/16/2017   Primary osteoarthritis of right shoulder 06/16/2017   Sprain of anterior talofibular ligament of right ankle 06/16/2017   Lumbar radiculopathy 04/21/2017   Lumbar degenerative disc disease 04/21/2017   Chronic pain syndrome 04/21/2017   Spinal stenosis, lumbar region, with neurogenic claudication 04/21/2017   SS-A antibody positive 03/05/2017   Chronic  knee pain after total replacement of left knee joint 08/03/2015   DOE (dyspnea on exertion) 12/28/2013    Past Surgical History:  Procedure Laterality Date   ANKLE SURGERY     CARPAL TUNNEL RELEASE     x3   COLONOSCOPY  2000?   COLONOSCOPY WITH PROPOFOL N/A 10/22/2017   Procedure: COLONOSCOPY WITH PROPOFOL;  Surgeon: Lucilla Lame, MD;  Location: [place];  Service: Endoscopy;  Laterality: N/A;   ESOPHAGOGASTRODUODENOSCOPY (EGD) WITH PROPOFOL N/A 10/22/2017   Procedure: ESOPHAGOGASTRODUODENOSCOPY (EGD) WITH PROPOFOL;  Surgeon: Lucilla Lame, MD;  Location: [place];  Service: Endoscopy;  Laterality: N/A;   FUSION OF TALONAVICULAR JOINT Right 08/03/2015   Procedure: TAILOR NAVICULAR JOINT FUSION - RIGHT ;  Surgeon: [name], DPM;  Location: [facility];  Service: Podiatry;  Laterality: Right;   JOINT REPLACEMENT Right    knee x 3 ,right x1 and left x2   POLYPECTOMY N/A 10/22/2017   Procedure: POLYPECTOMY;  Surgeon: Lucilla Lame, MD;  Location: [place];  Service: Endoscopy;  Laterality: N/A;   ROTATOR CUFF REPAIR     TONSILLECTOMY     TOTAL KNEE REVISION Right 01/20/2018   Procedure: POLYETHYLENE EXCHANGE;  Surgeon: Dereck Leep, MD;  Location: [facility];  Service: Orthopedics;  Laterality: Right;   TUBAL LIGATION     UPPER GI ENDOSCOPY  2000?    OB History     Gravida  4   Para      Term      Preterm      AB      Living         SAB      IAB      Ectopic      Multiple      Live Births           Obstetric Comments  1st Menstrual Cycle:  16 1st Pregnancy:  19          Home Medications    Prior to Admission medications   Medication Sig Start Date End Date Taking? Authorizing Provider  albuterol (VENTOLIN HFA) 108 (90 Base) MCG/ACT inhaler INHALE 2 PUFFS INTO THE LUNGS EVERY 4 HOURS AS NEEDED FOR WHEEZING 03/19/21  Yes Juline Patch, MD  allopurinol (ZYLOPRIM) 100 MG tablet Take 200 mg by mouth every morning.  08/06/17   Yes [provider]  aspirin EC 81 MG tablet Take 81 mg by mouth daily. Swallow whole.   Yes [provider]  betamethasone dipropionate 0.05 % cream APPLY TO PSORIASIS ON HANDS TWICE DAILY ONLY. AVOID FACE, GROIN AND AXILLA 05/30/19  Yes Ralene Bathe, MD  betamethasone valerate (VALISONE) 0.1 % cream Apply topically 2 (two) times daily as needed ([place]). 03/15/20  Yes Ralene Bathe, MD  calcipotriene (DOVONOX) 0.005 % cream APPLY TOPICALLY TO PSORIASIS AREAS ON LEGS AND FEET ONCE OR TWICE DAILY 03/28/21  Yes Ralene Bathe, MD  Cholecalciferol (VITAMIN D3) 2000 units TABS Take 2,000 Units by mouth daily.   Yes [provider]  diclofenac sodium (VOLTAREN) 1 % GEL Apply 2 g topically 3 (three) times daily.  08/11/17  Yes [provider]  ferrous sulfate 325 (65 FE) MG tablet Take 325 mg by mouth daily with breakfast.    Yes [provider]  gabapentin (NEURONTIN) 600 MG tablet Take 1 tablet (600 mg total) by mouth at bedtime. 02/12/21 08/11/21 Yes Gillis Santa, MD  hydrALAZINE (APRESOLINE) 50 MG tablet Take 1 tablet by mouth twice daily 03/19/21  Yes Juline Patch, MD  HYDROcodone-acetaminophen (NORCO) 7.5-325 MG tablet Take 1 tablet by mouth every 8 (eight) hours as needed for severe pain. Must last 30 days. 03/25/21 04/24/21 Yes Gillis Santa, MD  HYDROcodone-acetaminophen (NORCO) 7.5-325 MG tablet Take 1 tablet by mouth every 8 (eight) hours as needed for severe pain. Must last 30 days. 04/24/21 05/24/21 Yes Gillis Santa, MD  ketoconazole (NIZORAL) 2 % cream Apply topically daily as needed. 03/19/21  Yes Juline Patch, MD  levothyroxine (SYNTHROID) 175 MCG tablet Take 1 tablet (175 mcg total) by mouth daily. 04/12/21  Yes Juline Patch, MD  losartan (COZAAR) 25 MG tablet Take 1 tablet (25 mg total) by mouth every morning. 03/19/21  Yes Juline Patch, MD  meclizine (ANTIVERT) 12.5 MG tablet TAKE 1 TABLET(12.5 MG) BY MOUTH THREE TIMES DAILY AS NEEDED  FOR DIZZINESS 08/13/20  Yes Juline Patch, MD  methotrexate (RHEUMATREX) 2.5 MG tablet Take 7.5 mg by mouth every Friday. Take 5 tablets by mouth once weekly 09/29/18  Yes [provider]  montelukast (SINGULAIR) 10 MG tablet TAKE 1 TABLET(10 MG) BY MOUTH AT BEDTIME 03/19/21  Yes Juline Patch, MD  Multiple Vitamins-Calcium (ONE-A-DAY WOMENS FORMULA PO) Take 1 tablet by mouth daily.   Yes [provider]  mupirocin ointment (BACTROBAN) 2 % Apply 1 application topically 2 (two) times daily. 12/08/19  Yes Juline Patch, MD  omega-3 acid ethyl esters (LOVAZA) 1 g capsule Take 1 capsule (1 g total) by mouth 2 (two) times daily. 03/19/21  Yes Juline Patch, MD  omeprazole (PRILOSEC) 40 MG capsule TAKE 1 CAPSULE(40 MG) BY MOUTH DAILY 03/19/21  Yes Juline Patch, MD  OTEZLA 30 MG TABS Take 1 tablet by mouth daily as needed. 11/12/20  Yes [provider]  Polyethyl Glycol-Propyl Glycol (SYSTANE OP) Place 1 drop into both eyes daily as needed (dry eyes).    Yes [provider]  potassium chloride SA (KLOR-CON M) 20 MEQ tablet Take 0.5 tablets (10 mEq total) by mouth daily. 03/19/21  Yes Juline Patch, MD  tiZANidine (ZANAFLEX) 4 MG tablet Take 1 tablet (4 mg total) by mouth at bedtime. 02/12/21 08/11/21 Yes Gillis Santa, MD  triamcinolone (KENALOG) 0.1 % Apply 1 application topically 2 (two) times daily. 03/22/20  Yes Juline Patch, MD  triamterene-hydrochlorothiazide (MAXZIDE) 75-50 MG tablet Take 1 tablet by mouth daily. 03/19/21  Yes Juline Patch, MD  HYDROcodone-acetaminophen (NORCO) 7.5-325 MG tablet Take 1 tablet by mouth every 8 (eight) hours as needed for severe pain. Must last 30 days. 02/23/21 03/25/21  Gillis Santa, MD  fluticasone (FLONASE) 50 MCG/ACT nasal spray SHAKE LIQUID AND USE 1 SPRAY IN EACH NOSTRIL DAILY 02/28/19 04/24/19  Juline Patch, MD  mometasone (NASONEX) 50 MCG/ACT nasal spray Place 2 sprays into the nose daily. 04/12/19 04/24/19  Juline Patch,  MD    Family History Family History  Problem Relation Age of Onset   Heart failure Mother    Gout Mother    Arthritis Mother    Hypertension Mother    Stroke Mother    Heart failure Father    Diabetes Father    Hyperlipidemia Father    COPD Sister    Cancer Maternal Aunt        breast   Cancer Maternal Uncle        kidney   Breast cancer Neg Hx     Social History Social History   Tobacco Use   Smoking status: Former    Packs/day: 2.00    Years: 20.00    Pack years: 40.00    Types: Cigarettes    Quit date: 02/24/1993    Years since quitting: 28.1   Smokeless tobacco: Never  Vaping Use   Vaping Use: Never used  Substance Use Topics   Alcohol use: No    Alcohol/week: 0.0 standard drinks   Drug use: No     Allergies   Ampicillin, Penicillins, Vancomycin, and Vibramycin [doxycycline calcium]   Review of Systems Review of Systems  Constitutional:  Negative for fever.  Musculoskeletal:  Positive for arthralgias, gait problem and joint swelling.  Skin:  Negative for color change, rash and wound.  Neurological:  Negative for weakness and numbness.    Physical Exam Triage Vital Signs ED Triage Vitals  Enc Vitals Group     BP 04/22/21 1041 (!) 155/87     Pulse Rate 04/22/21 1041 70     Resp 04/22/21 1041 18     Temp 04/22/21 1041 99 F (37.2 C)     Temp Source 04/22/21 1041 Oral     SpO2 04/22/21 1041 96 %     Weight 04/22/21 1037 264 lb (119.7 kg)     Height 04/22/21 1037 5\' 11"  (1.803 m)     Head Circumference --      Peak Flow --      Pain Score 04/22/21 1037 10     Pain Loc --      Pain Edu? --      Excl. in [place]? --    No data found.  Updated Vital Signs BP (!) 155/87 (BP Location: Left Arm)    Pulse 70    Temp 99 F (37.2 C) (Oral)    Resp 18    Ht 5\' 11"  (1.803 m)    Wt 264 lb (119.7 kg)    SpO2 96%    BMI 36.82 kg/m     Physical Exam Vitals and nursing note reviewed.  Constitutional:      General: She is not in acute distress.     Appearance: Normal appearance. She is not ill-appearing or toxic-appearing.  HENT:     Head: Normocephalic and atraumatic.     Nose: Nose normal.  Eyes:     General: No scleral icterus.       Right eye: No discharge.        Left eye: No discharge.     Conjunctiva/sclera: Conjunctivae normal.  Cardiovascular:     Rate and Rhythm: Normal rate and regular rhythm.     Heart sounds: Normal heart sounds.  Pulmonary:     Effort: Pulmonary effort is normal. No respiratory distress.     Breath sounds: Normal breath sounds.  Musculoskeletal:     Cervical back: Neck supple.     Comments: LEFT FOOT/ANKLE: Small well-healing abrasion dorsal  foot.  Mild to moderate swelling of medial ankle, lateral ankle and dorsal hindfoot.  Tenderness to palpation over the medial and lateral malleoli, lateral foot.  Good strength and sensation.  Normal pulses.  Skin:    General: Skin is dry.  Neurological:     General: No focal deficit present.     Mental Status: She is alert. Mental status is at baseline.     Motor: No weakness.     Coordination: Coordination normal.     Gait: Gait abnormal (in wheelchair).  Psychiatric:        Mood and Affect: Mood normal.        Behavior: Behavior normal.        Thought Content: Thought content normal.     UC Treatments / Results  Labs (all labs ordered are listed, but only abnormal results are displayed) Labs Reviewed - No data to display  EKG   Radiology DG Ankle Complete Left  Result Date: 04/22/2021 CLINICAL DATA:  Mild popping noise and pain from ankle when coming off a tall step, medial ankle and dorsal foot pain with swelling EXAM: LEFT ANKLE COMPLETE - 3+ VIEW COMPARISON:  None FINDINGS: Osseous mineralization normal. Joint spaces preserved. Tiny plantar calcaneal spur. Diffuse soft tissue swelling. No acute fracture, dislocation, or bone destruction. IMPRESSION: Soft tissue swelling without acute osseous abnormalities. Electronically Signed   By: Lavonia Dana M.D.   On: 04/22/2021 11:14   DG Foot Complete Left  Result Date: 04/22/2021 CLINICAL DATA:  Pain and swelling after fall. EXAM: LEFT FOOT - COMPLETE 3+ VIEW COMPARISON:  None. FINDINGS: There is no evidence of fracture or dislocation. There is no evidence of arthropathy or other focal bone abnormality. Soft tissues are unremarkable. IMPRESSION: Negative. Electronically Signed   By: Dorise Bullion III M.D.   On: 04/22/2021 11:24    Procedures Procedures (including critical care time)  Medications Ordered in UC Medications - No data to display  Initial Impression / Assessment and Plan / UC Course  I have reviewed the triage vital signs and the nursing notes.  Pertinent labs & imaging results that were available during my care of the patient were reviewed by me and considered in my medical decision making (see chart for details).   71 year old female with history of psoriatic arthritis, osteoarthritis and gout presents for 1.5-week history of left foot and ankle pain/swelling following a accidental inversion injury.  X-rays of foot and ankle obtained today show no evidence of acute bony abnormality.  Discussed this with patient.  Advised her she likely has sprained her ankle and foot.  Supportive care encouraged with following RICE guidelines.  Advised continue her home pain medication if needed for pain.  Provided her with a supportive ankle brace.  Advised to keep her follow-up appointment on March 7 with her orthopedist.  Reviewed going to ED sooner for any worsening pain.   Final Clinical Impressions(s) / UC Diagnoses   Final diagnoses:  Sprain of left ankle, unspecified ligament, initial encounter  Foot swelling     Discharge Instructions      SPRAIN: Stressed avoiding painful activities . Reviewed RICE guidelines. Use medications as directed, including NSAIDs. If no NSAIDs have been prescribed for you today, you may take Aleve or Motrin over the counter. Take if no  contraindications to NSAIDs. May use Tylenol in between doses of NSAIDs or your home pain meds.  If no improvement in the next 1-2 weeks, f/u with PCP or return to our  office for reexamination, and please feel free to call or return at any time for any questions or concerns you may have and we will be happy to help you!       ED Prescriptions   None    I have reviewed the PDMP during this encounter.   Danton Clap, PA-C 04/22/21 1228

## 2021-04-22 NOTE — Discharge Instructions (Signed)
SPRAIN: Stressed avoiding painful activities . Reviewed RICE guidelines. Use medications as directed, including NSAIDs. If no NSAIDs have been prescribed for you today, you may take Aleve or Motrin over the counter. Take if no contraindications to NSAIDs. May use Tylenol in between doses of NSAIDs or your home pain meds.  If no improvement in the next 1-2 weeks, f/u with PCP or return to our office for reexamination, and please feel free to call or return at any time for any questions or concerns you may have and we will be happy to help you!

## 2021-04-24 ENCOUNTER — Inpatient Hospital Stay: Payer: Medicare Other

## 2021-04-26 ENCOUNTER — Inpatient Hospital Stay: Payer: Medicare Other | Attending: Oncology

## 2021-04-26 ENCOUNTER — Other Ambulatory Visit: Payer: Self-pay

## 2021-04-26 VITALS — BP 143/74 | HR 88 | Temp 96.9°F | Resp 18

## 2021-04-26 DIAGNOSIS — N1832 Chronic kidney disease, stage 3b: Secondary | ICD-10-CM

## 2021-04-26 DIAGNOSIS — Z79899 Other long term (current) drug therapy: Secondary | ICD-10-CM | POA: Insufficient documentation

## 2021-04-26 DIAGNOSIS — I129 Hypertensive chronic kidney disease with stage 1 through stage 4 chronic kidney disease, or unspecified chronic kidney disease: Secondary | ICD-10-CM | POA: Insufficient documentation

## 2021-04-26 DIAGNOSIS — D631 Anemia in chronic kidney disease: Secondary | ICD-10-CM

## 2021-04-26 DIAGNOSIS — N183 Chronic kidney disease, stage 3 unspecified: Secondary | ICD-10-CM | POA: Diagnosis not present

## 2021-04-26 DIAGNOSIS — D649 Anemia, unspecified: Secondary | ICD-10-CM

## 2021-04-26 MED ORDER — IRON SUCROSE 20 MG/ML IV SOLN
200.0000 mg | Freq: Once | INTRAVENOUS | Status: AC
  Administered 2021-04-26: 200 mg via INTRAVENOUS
  Filled 2021-04-26: qty 10

## 2021-04-26 MED ORDER — SODIUM CHLORIDE 0.9 % IV SOLN
INTRAVENOUS | Status: DC
  Filled 2021-04-26: qty 250

## 2021-04-26 MED ORDER — SODIUM CHLORIDE 0.9 % IV SOLN: 200.0000 mg | Freq: Once | INTRAVENOUS | Status: DC

## 2021-04-26 NOTE — Patient Instructions (Signed)
[facility]  Discharge Instructions: ?Thank you for choosing [facility] to provide your oncology and hematology care.  ?If you have a lab appointment with the Cancer Center, please go directly to the Cancer Center and check in at the registration area. ? ?Wear comfortable clothing and clothing appropriate for easy access to any Portacath or PICC line.  ? ?We strive to give you quality time with your provider. You may need to reschedule your appointment if you arrive late (15 or more minutes).  Arriving late affects you and other patients whose appointments are after yours.  Also, if you miss three or more appointments without notifying the office, you may be dismissed from the clinic at the provider?s discretion.    ?  ?For prescription refill requests, have your pharmacy contact our office and allow 72 hours for refills to be completed.   ? ?Today you received the following chemotherapy and/or immunotherapy agents VENOFER    ?  ?To help prevent nausea and vomiting after your treatment, we encourage you to take your nausea medication as directed. ? ?BELOW ARE SYMPTOMS THAT SHOULD BE REPORTED IMMEDIATELY: ?*FEVER GREATER THAN 100.4 F (38 ?C) OR HIGHER ?*CHILLS OR SWEATING ?*NAUSEA AND VOMITING THAT IS NOT CONTROLLED WITH YOUR NAUSEA MEDICATION ?*UNUSUAL SHORTNESS OF BREATH ?*UNUSUAL BRUISING OR BLEEDING ?*URINARY PROBLEMS (pain or burning when urinating, or frequent urination) ?*BOWEL PROBLEMS (unusual diarrhea, constipation, pain near the anus) ?TENDERNESS IN MOUTH AND THROAT WITH OR WITHOUT PRESENCE OF ULCERS (sore throat, sores in mouth, or a toothache) ?UNUSUAL RASH, SWELLING OR PAIN  ?UNUSUAL VAGINAL DISCHARGE OR ITCHING  ? ?Items with * indicate a potential emergency and should be followed up as soon as possible or go to the Emergency Department if any problems should occur. ? ?Please show the CHEMOTHERAPY ALERT CARD or IMMUNOTHERAPY ALERT CARD at check-in to the  Emergency Department and triage nurse. ? ?Should you have questions after your visit or need to cancel or reschedule your appointment, please contact [facility]  [phone] and follow the prompts.  Office hours are 8:00 a.m. to 4:30 p.m. Monday - Friday. Please note that voicemails left after 4:00 p.m. may not be returned until the following business day.  We are closed weekends and major holidays. You have access to a nurse at all times for urgent questions. Please call the main number to the clinic [phone] and follow the prompts. ? ?For any non-urgent questions, you may also contact your provider using MyChart. We now offer e-Visits for anyone 18 and older to request care online for non-urgent symptoms. For details visit [email or web address]. ?  ?Also download the MyChart app! Go to the app store, search "MyChart", open the app, select [facility], and log in with your MyChart username and password. ? ?Due to Covid, a mask is required upon entering the hospital/clinic. If you do not have a mask, one will be given to you upon arrival. For doctor visits, patients may have 1 support person aged 18 or older with them. For treatment visits, patients cannot have anyone with them due to current Covid guidelines and our immunocompromised population.  ? ?Iron Sucrose Injection ?What is this medication? ?IRON SUCROSE (EYE ern SOO krose) treats low levels of iron (iron deficiency anemia) in people with kidney disease. Iron is a mineral that plays an important role in making red blood cells, which carry oxygen from your lungs to the rest of your body. ?This medicine may   be used for other purposes; ask your health care provider or pharmacist if you have questions. ?COMMON BRAND NAME(S): Venofer ?What should I tell my care team before I take this medication? ?They need to know if you have any of these conditions: ?Anemia not caused by low iron levels ?Heart disease ?High levels of  iron in the blood ?Kidney disease ?Liver disease ?An unusual or allergic reaction to iron, other medications, foods, dyes, or preservatives ?Pregnant or trying to get pregnant ?Breast-feeding ?How should I use this medication? ?This medication is for infusion into a vein. It is given in a hospital or clinic setting. ?Talk to your care team about the use of this medication in children. While this medication may be prescribed for children as young as 2 years for selected conditions, precautions do apply. ?Overdosage: If you think you have taken too much of this medicine contact a poison control center or emergency room at once. ?NOTE: This medicine is only for you. Do not share this medicine with others. ?What if I miss a dose? ?It is important not to miss your dose. Call your care team if you are unable to keep an appointment. ?What may interact with this medication? ?Do not take this medication with any of the following: ?Deferoxamine ?Dimercaprol ?Other iron products ?This medication may also interact with the following: ?Chloramphenicol ?Deferasirox ?This list may not describe all possible interactions. Give your health care provider a list of all the medicines, herbs, non-prescription drugs, or dietary supplements you use. Also tell them if you smoke, drink alcohol, or use illegal drugs. Some items may interact with your medicine. ?What should I watch for while using this medication? ?Visit your care team regularly. Tell your care team if your symptoms do not start to get better or if they get worse. You may need blood work done while you are taking this medication. ?You may need to follow a special diet. Talk to your care team. Foods that contain iron include: whole grains/cereals, dried fruits, beans, or peas, leafy green vegetables, and organ meats (liver, kidney). ?What side effects may I notice from receiving this medication? ?Side effects that you should report to your care team as soon as  possible: ?Allergic reactions--skin rash, itching, hives, swelling of the face, lips, tongue, or throat ?Low blood pressure--dizziness, feeling faint or lightheaded, blurry vision ?Shortness of breath ?Side effects that usually do not require medical attention (report to your care team if they continue or are bothersome): ?Flushing ?Headache ?Joint pain ?Muscle pain ?Nausea ?Pain, redness, or irritation at injection site ?This list may not describe all possible side effects. Call your doctor for medical advice about side effects. You may report side effects to FDA at 1-800-FDA-1088. ?Where should I keep my medication? ?This medication is given in a hospital or clinic and will not be stored at home. ?NOTE: This sheet is a summary. It may not cover all possible information. If you have questions about this medicine, talk to your doctor, pharmacist, or health care provider. ?? 2022 Elsevier/Gold Standard (2020-07-06 00:00:00) ? ?

## 2021-04-30 DIAGNOSIS — M79674 Pain in right toe(s): Secondary | ICD-10-CM | POA: Diagnosis not present

## 2021-04-30 DIAGNOSIS — L4050 Arthropathic psoriasis, unspecified: Secondary | ICD-10-CM | POA: Diagnosis not present

## 2021-04-30 DIAGNOSIS — M79675 Pain in left toe(s): Secondary | ICD-10-CM | POA: Diagnosis not present

## 2021-04-30 DIAGNOSIS — M9689 Other intraoperative and postprocedural complications and disorders of the musculoskeletal system: Secondary | ICD-10-CM | POA: Diagnosis not present

## 2021-04-30 DIAGNOSIS — B351 Tinea unguium: Secondary | ICD-10-CM | POA: Diagnosis not present

## 2021-04-30 DIAGNOSIS — S93492A Sprain of other ligament of left ankle, initial encounter: Secondary | ICD-10-CM | POA: Diagnosis not present

## 2021-05-14 ENCOUNTER — Encounter: Payer: Self-pay | Admitting: Student in an Organized Health Care Education/Training Program

## 2021-05-14 ENCOUNTER — Ambulatory Visit
Payer: Medicare Other | Attending: Student in an Organized Health Care Education/Training Program | Admitting: Student in an Organized Health Care Education/Training Program

## 2021-05-14 ENCOUNTER — Other Ambulatory Visit: Payer: Self-pay

## 2021-05-14 VITALS — BP 142/103 | HR 100 | Temp 97.7°F | Resp 22 | Ht 71.0 in | Wt 264.0 lb

## 2021-05-14 DIAGNOSIS — M1712 Unilateral primary osteoarthritis, left knee: Secondary | ICD-10-CM

## 2021-05-14 DIAGNOSIS — M7062 Trochanteric bursitis, left hip: Secondary | ICD-10-CM | POA: Diagnosis not present

## 2021-05-14 DIAGNOSIS — Z96652 Presence of left artificial knee joint: Secondary | ICD-10-CM | POA: Diagnosis not present

## 2021-05-14 DIAGNOSIS — M47816 Spondylosis without myelopathy or radiculopathy, lumbar region: Secondary | ICD-10-CM | POA: Diagnosis not present

## 2021-05-14 DIAGNOSIS — M19012 Primary osteoarthritis, left shoulder: Secondary | ICD-10-CM

## 2021-05-14 DIAGNOSIS — M25562 Pain in left knee: Secondary | ICD-10-CM | POA: Diagnosis not present

## 2021-05-14 DIAGNOSIS — M5136 Other intervertebral disc degeneration, lumbar region: Secondary | ICD-10-CM | POA: Diagnosis not present

## 2021-05-14 DIAGNOSIS — M47812 Spondylosis without myelopathy or radiculopathy, cervical region: Secondary | ICD-10-CM

## 2021-05-14 DIAGNOSIS — M47818 Spondylosis without myelopathy or radiculopathy, sacral and sacrococcygeal region: Secondary | ICD-10-CM

## 2021-05-14 DIAGNOSIS — G5702 Lesion of sciatic nerve, left lower limb: Secondary | ICD-10-CM | POA: Diagnosis not present

## 2021-05-14 DIAGNOSIS — M19011 Primary osteoarthritis, right shoulder: Secondary | ICD-10-CM | POA: Diagnosis not present

## 2021-05-14 DIAGNOSIS — M2352 Chronic instability of knee, left knee: Secondary | ICD-10-CM | POA: Diagnosis not present

## 2021-05-14 DIAGNOSIS — G8929 Other chronic pain: Secondary | ICD-10-CM | POA: Diagnosis not present

## 2021-05-14 DIAGNOSIS — M1612 Unilateral primary osteoarthritis, left hip: Secondary | ICD-10-CM

## 2021-05-14 DIAGNOSIS — G894 Chronic pain syndrome: Secondary | ICD-10-CM

## 2021-05-14 MED ORDER — HYDROCODONE-ACETAMINOPHEN 7.5-325 MG PO TABS: 1.0000 | ORAL_TABLET | Freq: Three times a day (TID) | ORAL | 0 refills | Status: DC | PRN

## 2021-05-14 MED ORDER — TIZANIDINE HCL 4 MG PO TABS: 4.0000 mg | ORAL_TABLET | Freq: Every day | ORAL | 5 refills | Status: DC

## 2021-05-14 NOTE — Progress Notes (Signed)
PROVIDER NOTE: Information contained herein reflects review and annotations entered in association with encounter. Interpretation of such information and data should be left to medically-trained personnel. Information provided to patient can be located elsewhere in the medical record under "Patient Instructions". Document created using STT-dictation technology, any transcriptional errors that may result from process are unintentional.  ?  ?Patient: Michele Moore  Service Category: E/M  Provider: Gillis Santa, MD  ?DOB: 31-Oct-1950  DOS: 05/14/2021  Specialty: Interventional Pain Management  ?MRN: [id number]  Setting: Ambulatory outpatient  PCP: Juline Patch, MD  ?Type: Established Patient    Referring Provider: Juline Patch, MD  ?Location: Office  Delivery: Face-to-face    ? ?HPI  ?Michele Moore, a 71 y.o. year old female, is here today because of her Arthritis of left knee [M17.12]. Michele Moore primary complain today is Back Pain (low), Knee Pain (left), Foot Pain (left), and Hip Pain (left) ? ?Last encounter: My last encounter with her was on 03/21/21 ? ?Pertinent problems: Michele Moore has Chronic knee pain after total replacement of left knee joint; Lumbar radiculopathy; Lumbar degenerative disc disease; Chronic pain syndrome; Spinal stenosis, lumbar region, with neurogenic claudication; Cervical facet syndrome; Primary osteoarthritis of right shoulder; Sprain of anterior talofibular ligament of right ankle; Severe obesity (BMI 35.0-35.9 with comorbidity) ([place]); CKD (chronic kidney disease), stage III ([place]); Primary osteoarthritis of left hip; and Lumbar spondylosis on their pertinent problem list. ?Pain Assessment: Severity of Chronic pain is reported as a 10-Worst pain ever/10. Location: Back Lower/radiates into left hip. Onset: More than a month ago. Quality: Shooting. Timing: Constant. Modifying factor(s): meds, rest. ?Vitals:  height is '5\' 11"'$  (1.803 m) and weight is 264 lb (119.7 kg). Her temperature is 97.7  ?F (36.5 ?C). Her blood pressure is 142/103 (abnormal) and her pulse is 100. Her respiration is 22 (abnormal) and oxygen saturation is 97%.  ? ?Reason for encounter:  ? ?Michele Moore presents today for medication management as well as worsening left knee pain (hx of knee replacement and revision )  ?Left knee edema, has been told that she needs to have revision surgery but Dr Marry Guan is recommending genicular RFA to see if we can avoid surgery. ?She is s/p genicular nerve block on left knee on 02/28/20 that provided 100% pain relief for 2 days. ? ?She is due for a refill of her chronic analgesic regimen as below.  UDS up-to-date and appropriate. ? ?Pharmacotherapy Assessment  ?Analgesic: Norco 7.5 mg TID PRN #90/month, 22.5   ? ?Monitoring: ?[place] PMP: PDMP reviewed during this encounter.       ?Pharmacotherapy: No side-effects or adverse reactions reported. ?Compliance: No problems identified. ?Effectiveness: Clinically acceptable. ? ?Dewayne Shorter, RN  05/14/2021 11:26 AM  Sign when Signing Visit ?Nursing Pain Medication Assessment:  ?Safety precautions to be maintained throughout the outpatient stay will include: orient to surroundings, keep bed in low position, maintain call bell within reach at all times, provide assistance with transfer out of bed and ambulation.  ?Medication Inspection Compliance: Pill count conducted under aseptic conditions, in front of the patient. Neither the pills nor the bottle was removed from the patient's sight at any time. Once count was completed pills were immediately returned to the patient in their original bottle. ? ?Medication: Hydrocodone/APAP ?Pill/Patch Count:  24 of 90 pills remain ?Pill/Patch Appearance: Markings consistent with prescribed medication ?Bottle Appearance: Standard pharmacy container. Clearly labeled. ?Filled Date: 03 / 01 / 2023 ?Last Medication intake:  Today ?  ?  UDS:  ?Summary  ?Date Value Ref Range Status  ?06/14/2020 Note  Final  ?  Comment:  ?   ==================================================================== ?[employer] Select 13 (MW) ?==================================================================== ?Test                             Result       Flag       Units ? ?Drug Present and Declared for Prescription Verification ?  Hydrocodone                    1019         EXPECTED   ng/mg creat ?  Hydromorphone                  166          EXPECTED   ng/mg creat ?  Dihydrocodeine                 71           EXPECTED   ng/mg creat ?  Norhydrocodone                 536          EXPECTED   ng/mg creat ?   Sources of hydrocodone include scheduled prescription medications. ?   Hydromorphone, dihydrocodeine and norhydrocodone are expected ?   metabolites of hydrocodone. Hydromorphone and dihydrocodeine are ?   also available as scheduled prescription medications. ? ?==================================================================== ?Test                      Result    Flag   Units      Ref Range ?  Creatinine              166              mg/dL      >=20 ?==================================================================== ?Declared Medications: ? The flagging and interpretation on this report are based on the ? following declared medications.  Unexpected results may arise from ? inaccuracies in the declared medications. ? ? **Note: The testing scope of this panel includes these medications: ? ? Hydrocodone (Norco) ? ? **Note: The testing scope of this panel does not include the ? following reported medications: ? ? Acetaminophen (Norco) ? Albuterol (Ventolin HFA) ? Allopurinol (Zyloprim) ? Aspirin ? Betamethasone ? Fluticasone (Flonase) ? Gabapentin (Neurontin) ? Hydralazine (Apresoline) ? Hydrochlorothiazide (Maxzide) ? Iron ? Ketoconazole (Nizoral) ? Levothyroxine (Synthroid) ? Losartan (Cozaar) ? Meclizine (Antivert) ? Methotrexate ? Metronidazole (Flagyl) ? Mometasone (Nasonex) ? Montelukast (Singulair) ? Multivitamin ? Mupirocin (Bactroban) ? Omega-3  Fatty Acids ? Omeprazole (Prilosec) ? Polyethylene Glycol ? Potassium (Klor-Con) ? Tizanidine (Zanaflex) ? Topical Diclofenac (Voltaren) ? Triamcinolone (Kenalog) ? Triamterene (Maxzide) ? Vitamin D3 ?==================================================================== ?For clinical consultation, please call [phone]. ?==================================================================== ?  ?  ? ?ROS  ?Constitutional: Denies any fever or chills ?Gastrointestinal: No reported hemesis, hematochezia, vomiting, or acute GI distress ?Musculoskeletal:  Left knee pain ?Neurological: No reported episodes of acute onset apraxia, aphasia, dysarthria, agnosia, amnesia, paralysis, loss of coordination, or loss of consciousness ? ?Medication Review  ?Apremilast, HYDROcodone-acetaminophen, Multiple Vitamins-Calcium, Polyethyl Glycol-Propyl Glycol, Vitamin D3, albuterol, allopurinol, aspirin EC, betamethasone dipropionate, betamethasone valerate, calcipotriene, diclofenac sodium, ferrous sulfate, fluticasone, gabapentin, hydrALAZINE, ketoconazole, levothyroxine, losartan, meclizine, methotrexate, mometasone, montelukast, mupirocin ointment, omega-3 acid ethyl esters, omeprazole, potassium chloride SA, tiZANidine, triamcinolone cream, and triamterene-hydrochlorothiazide ? ?History Review  ?Allergy: Michele Moore  is allergic to ampicillin, penicillins, vancomycin, and vibramycin [doxycycline calcium]. ?Drug: Michele Moore  reports no history of drug use. ?Alcohol:  reports no history of alcohol use. ?Tobacco:  reports that she quit smoking about 28 years ago. Her smoking use included cigarettes. She has a 40.00 pack-year smoking history. She has never used smokeless tobacco. ?Social: Michele Moore  reports that she quit smoking about 28 years ago. Her smoking use included cigarettes. She has a 40.00 pack-year smoking history. She has never used smokeless tobacco. She reports that she does not drink alcohol and does not use drugs. ?Medical:   has a past medical history of Allergy, Anemia, Arthritis, Asthma, Cataract, Chronic kidney disease, Collagen vascular disease ([place]), Complication of anesthesia, Dyspnea, Dysrhythmia, Family history of adverse reaction

## 2021-05-14 NOTE — Progress Notes (Signed)
Nursing Pain Medication Assessment:  ?Safety precautions to be maintained throughout the outpatient stay will include: orient to surroundings, keep bed in low position, maintain call bell within reach at all times, provide assistance with transfer out of bed and ambulation.  ?Medication Inspection Compliance: Pill count conducted under aseptic conditions, in front of the patient. Neither the pills nor the bottle was removed from the patient's sight at any time. Once count was completed pills were immediately returned to the patient in their original bottle. ? ?Medication: Hydrocodone/APAP ?Pill/Patch Count:  24 of 90 pills remain ?Pill/Patch Appearance: Markings consistent with prescribed medication ?Bottle Appearance: Standard pharmacy container. Clearly labeled. ?Filled Date: 03 / 01 / 2023 ?Last Medication intake:  Today ?

## 2021-05-14 NOTE — Patient Instructions (Signed)
______________________________________________________________________ ? ?Preparing for Procedure with Sedation ? ?NOTICE: Due to recent regulatory changes, starting on September 24, 2020, procedures requiring intravenous (IV) sedation will no longer be performed at the [facility].  These types of procedures are required to be performed at [facility] ambulatory surgery facility.  We are very sorry for the inconvenience. ? ?Procedure appointments are limited to planned procedures: ?No Prescription Refills. ?No disability issues will be discussed. ?No medication changes will be discussed. ? ?Instructions: ?Oral Intake: Do not eat or drink anything for at least 8 hours prior to your procedure. (Exception: Blood Pressure Medication. See below.) ?Transportation: A driver is required. You may not drive yourself after the procedure. ?Blood Pressure Medicine: Do not forget to take your blood pressure medicine with a sip of water the morning of the procedure. If your Diastolic (lower reading) is above 100 mmHg, elective cases will be cancelled/rescheduled. ?Blood thinners: These will need to be stopped for procedures. Notify our staff if you are taking any blood thinners. Depending on which one you take, there will be specific instructions on how and when to stop it. ?Diabetics on insulin: Notify the staff so that you can be scheduled 1st case in the morning. If your diabetes requires high dose insulin, take only ? of your normal insulin dose the morning of the procedure and notify the staff that you have done so. ?Preventing infections: Shower with an antibacterial soap the morning of your procedure. ?Build-up your immune system: Take 1000 mg of Vitamin C with every meal (3 times a day) the day prior to your procedure. ?Antibiotics: Inform the staff if you have a condition or reason that requires you to take antibiotics before dental procedures. ?Pregnancy: If you are pregnant, call and cancel the procedure. ?Sickness: If  you have a cold, fever, or any active infections, call and cancel the procedure. ?Arrival: You must be in the facility at least 30 minutes prior to your scheduled procedure. ?Children: Do not bring children with you. ?Dress appropriately: Bring dark clothing that you would not mind if they get stained. ?Valuables: Do not bring any jewelry or valuables. ? ?Reasons to call and reschedule or cancel your procedure: (Following these recommendations will minimize the risk of a serious complication.) ?Surgeries: Avoid having procedures within 2 weeks of any surgery. (Avoid for 2 weeks before or after any surgery). ?Flu Shots: Avoid having procedures within 2 weeks of a flu shots. (Avoid for 2 weeks before or after immunizations). ?Barium: Avoid having a procedure within 7-10 days after having had a radiological study involving the use of radiological contrast. (Myelograms, Barium swallow or enema study). ?Heart attacks: Avoid any elective procedures or surgeries for the initial 6 months after a "Myocardial Infarction" (Heart Attack). ?Blood thinners: It is imperative that you stop these medications before procedures. Let us know if you if you take any blood thinner.  ?Infection: Avoid procedures during or within two weeks of an infection (including chest colds or gastrointestinal problems). Symptoms associated with infections include: Localized redness, fever, chills, night sweats or profuse sweating, burning sensation when voiding, cough, congestion, stuffiness, runny nose, sore throat, diarrhea, nausea, vomiting, cold or Flu symptoms, recent or current infections. It is specially important if the infection is over the area that we intend to treat. ?Heart and lung problems: Symptoms that may suggest an active cardiopulmonary problem include: cough, chest pain, breathing difficulties or shortness of breath, dizziness, ankle swelling, uncontrolled high or unusually low blood pressure, and/or palpitations. If you are  experiencing any of these symptoms, cancel your procedure and contact your primary care physician for an evaluation. ? ?Remember:  ?Regular Business hours are:  ?Monday to Thursday 8:00 AM to 4:00 PM ? ?Provider's Schedule: ?Milinda Pointer, MD:  ?Procedure days: Tuesday and Thursday 7:30 AM to 4:00 PM ? ?Gillis Santa, MD:  ?Procedure days: Monday and Wednesday 7:30 AM to 4:00 PM ?______________________________________________________________________ ? Radiofrequency Ablation ?Radiofrequency ablation is a procedure that is performed to relieve pain. The procedure is often used for back, neck, or arm pain. Radiofrequency ablation involves the use of a machine that creates radio waves to make heat. During the procedure, the heat is applied to the nerve that carries the pain signal. The heat damages the nerve and interferes with the pain signal. Pain relief usually starts about 2 weeks after the procedure and lasts for 6 months to 1 year. ?Tell a health care provider about: ?Any allergies you have. ?All medicines you are taking, including vitamins, herbs, eye drops, creams, and over-the-counter medicines. ?Any problems you or family members have had with anesthetic medicines. ?Any bleeding problems you have. ?Any surgeries you have had. ?Any medical conditions you have. ?Whether you are pregnant or may be pregnant. ?What are the risks? ?Generally, this is a safe procedure. However, problems may occur, including: ?Pain or soreness at the injection site. ?Allergic reaction to medicines given during the procedure. ?Bleeding. ?Infection at the injection site. ?Damage to nerves or blood vessels. ?What happens before the procedure? ?When to stop eating and drinking ?Follow instructions from your health care provider about what you may eat and drink before your procedure. These may include: ?8 hours before the procedure ?Stop eating most foods. Do not eat meat, fried foods, or fatty foods. ?Eat only light foods, such as  toast or crackers. ?All liquids are okay except energy drinks and alcohol. ?6 hours before the procedure ?Stop eating. ?Drink only clear liquids, such as water, clear fruit juice, black coffee, plain tea, and sports drinks. ?Do not drink energy drinks or alcohol. ?2 hours before the procedure ?Stop drinking all liquids. ?You may be allowed to take medicine with small sips of water. ?If you do not follow your health care provider's instructions, your procedure may be delayed or canceled. ?Medicines ?Ask your health care provider about: ?Changing or stopping your regular medicines. This is especially important if you are taking diabetes medicines or blood thinners. ?Taking medicines such as aspirin and ibuprofen. These medicines can thin your blood. Do not take these medicines unless your health care provider tells you to take them. ?Taking over-the-counter medicines, vitamins, herbs, and supplements. ?General instructions ?Ask your health care provider what steps will be taken to help prevent infection. These steps may include: ?Removing hair at the procedure site. ?Washing skin with a germ-killing soap. ?Taking antibiotic medicine. ?If you will be going home right after the procedure, plan to have a responsible adult: ?Take you home from the hospital or clinic. You will not be allowed to drive. ?Care for you for the time you are told. ?What happens during the procedure? ? ?You will be awake during the procedure. You will need to be able to talk with the health care provider during the procedure. ?An IV will be inserted into one of your veins. ?You will be given one or more of the following: ?A medicine to help you relax (sedative). ?A medicine to numb the area (local anesthetic). ?Your health care provider will insert a radiofrequency needle into the area to  be treated. This is done with the help of fluoroscopy. ?A wire that carries the radio waves (electrode) will be put through the radiofrequency needle. ?An  electrical pulse will be sent through the electrode to verify the correct nerve that is causing your pain. You will feel a tingling sensation, and you may have muscle twitching. ?The tissue around the needle tip wil

## 2021-05-29 ENCOUNTER — Ambulatory Visit (HOSPITAL_BASED_OUTPATIENT_CLINIC_OR_DEPARTMENT_OTHER): Payer: Medicare Other | Admitting: Student in an Organized Health Care Education/Training Program

## 2021-05-29 ENCOUNTER — Encounter: Payer: Self-pay | Admitting: Student in an Organized Health Care Education/Training Program

## 2021-05-29 ENCOUNTER — Telehealth: Payer: Self-pay

## 2021-05-29 ENCOUNTER — Ambulatory Visit
Admission: RE | Admit: 2021-05-29 | Discharge: 2021-05-29 | Disposition: A | Discharge status: Home / Self Care | Payer: Medicare Other | Source: Ambulatory Visit | Attending: Student in an Organized Health Care Education/Training Program | Admitting: Student in an Organized Health Care Education/Training Program

## 2021-05-29 VITALS — BP 120/58 | HR 95 | Temp 97.9°F | Resp 16 | Ht 71.0 in | Wt 264.0 lb

## 2021-05-29 DIAGNOSIS — M1712 Unilateral primary osteoarthritis, left knee: Secondary | ICD-10-CM | POA: Diagnosis not present

## 2021-05-29 DIAGNOSIS — G8929 Other chronic pain: Secondary | ICD-10-CM | POA: Diagnosis not present

## 2021-05-29 DIAGNOSIS — Z96652 Presence of left artificial knee joint: Secondary | ICD-10-CM | POA: Insufficient documentation

## 2021-05-29 DIAGNOSIS — M25562 Pain in left knee: Secondary | ICD-10-CM | POA: Diagnosis not present

## 2021-05-29 DIAGNOSIS — G894 Chronic pain syndrome: Secondary | ICD-10-CM | POA: Diagnosis not present

## 2021-05-29 MED ORDER — LIDOCAINE HCL 2 % IJ SOLN
INTRAMUSCULAR | Status: AC
  Filled 2021-05-29: qty 20

## 2021-05-29 MED ORDER — MIDAZOLAM HCL 5 MG/5ML IJ SOLN
0.5000 mg | Freq: Once | INTRAMUSCULAR | Status: AC
  Administered 2021-05-29: 1 mg via INTRAVENOUS

## 2021-05-29 MED ORDER — ROPIVACAINE HCL 2 MG/ML IJ SOLN
9.0000 mL | Freq: Once | INTRAMUSCULAR | Status: AC
  Administered 2021-05-29: 9 mL via PERINEURAL

## 2021-05-29 MED ORDER — DEXAMETHASONE SODIUM PHOSPHATE 10 MG/ML IJ SOLN
10.0000 mg | Freq: Once | INTRAMUSCULAR | Status: AC
  Administered 2021-05-29: 10 mg

## 2021-05-29 MED ORDER — ROPIVACAINE HCL 2 MG/ML IJ SOLN
INTRAMUSCULAR | Status: AC
  Filled 2021-05-29: qty 20

## 2021-05-29 MED ORDER — MIDAZOLAM HCL 5 MG/5ML IJ SOLN
INTRAMUSCULAR | Status: AC
  Filled 2021-05-29: qty 5

## 2021-05-29 MED ORDER — DEXAMETHASONE SODIUM PHOSPHATE 10 MG/ML IJ SOLN
INTRAMUSCULAR | Status: AC
  Filled 2021-05-29: qty 1

## 2021-05-29 MED ORDER — LIDOCAINE HCL 2 % IJ SOLN
20.0000 mL | Freq: Once | INTRAMUSCULAR | Status: AC
  Administered 2021-05-29: 400 mg

## 2021-05-29 NOTE — Telephone Encounter (Signed)
She called back and stated her leg is bruising from her knee all the way down her leg from her procedure today. I suggested it may be the prep but she said its the back of her leg as well and she didn't prep that area.  ?

## 2021-05-29 NOTE — Patient Instructions (Signed)

## 2021-05-29 NOTE — Progress Notes (Signed)
PROVIDER NOTE: Interpretation of information contained herein should be left to medically-trained personnel. Specific patient instructions are provided elsewhere under "Patient Instructions" section of medical record. This document was created in part using STT-dictation technology, any transcriptional errors that may result from this process are unintentional.  ?Patient: Michele Moore ?Type: Established ?DOB: 1950-06-24 ?MRN: [id number] ?PCP: Juline Patch, MD  Service: Procedure ?DOS: 05/29/2021 ?Setting: Ambulatory ?Location: Ambulatory outpatient facility ?Delivery: Face-to-face Provider: Gillis Santa, MD ?Specialty: Interventional Pain Management ?Specialty designation: 09 ?Location: Outpatient facility ?Ref. Prov.: Juline Patch, MD   ? ?Primary Reason for Visit: Interventional Pain Management Treatment. ?CC: No chief complaint on file. ? ?  ?Procedure:          Anesthesia, Analgesia, Anxiolysis:  ?Type: Therapeutic Superolateral, Superomedial, and Inferomedial, Genicular Nerve Radiofrequency Ablation (destruction).   #1  ?Region: Lateral, Anterior, and Medial aspects of the knee joint, above and below the knee joint proper. ?Level: Superior and inferior to the knee joint. ?Laterality: Left  Anesthesia: Local (1-2% Lidocaine)  ?Anxiolysis: IV Versed x 1 mg   ?Sedation: Minimal  ?Guidance: Fluoroscopy         ? ? ?Position: Supine  ? ?Indications: ?1. Arthritis of left knee   ?2. Chronic knee pain after total replacement of left knee joint   ?3. Chronic pain syndrome   ? ?Michele Moore has been dealing with the above chronic pain for longer than three months and has either failed to respond, was unable to tolerate, or simply did not get enough benefit from other more conservative therapies including, but not limited to: ?1. Over-the-counter medications ?2. Anti-inflammatory medications ?3. Muscle relaxants ?4. Membrane stabilizers ?5. Opioids ?6. Physical therapy and/or chiropractic manipulation ?7. Modalities (Heat,  ice, etc.) ?8. Invasive techniques such as nerve blocks. ?Michele Moore has attained more than 50% relief of the pain from a series of diagnostic injections conducted in separate occasions. ? ?Pain Score: ?Pre-procedure: 10-Worst pain ever/10 ?Post-procedure: 1 /10 ? ?  ?Pre-op H&P Assessment:  ?Michele Moore is a 71 y.o. (year old), female patient, seen today for interventional treatment. She  has a past surgical history that includes Carpal tunnel release; Rotator cuff repair; Ankle surgery; Tubal ligation; Colonoscopy (2000?); Upper gi endoscopy (2000?); Tonsillectomy; Fusion of talonavicular joint (Right, 08/03/2015); Colonoscopy with propofol (N/A, 10/22/2017); Esophagogastroduodenoscopy (egd) with propofol (N/A, 10/22/2017); polypectomy (N/A, 10/22/2017); Total knee revision (Right, 01/20/2018); and Joint replacement (Right). Michele Moore has a current medication list which includes the following prescription(s): albuterol, allopurinol, aspirin ec, betamethasone dipropionate, betamethasone valerate, calcipotriene, vitamin d3, diclofenac sodium, ferrous sulfate, gabapentin, hydralazine, hydrocodone-acetaminophen, [START ON 06/24/2021] hydrocodone-acetaminophen, [START ON 07/24/2021] hydrocodone-acetaminophen, ketoconazole, levothyroxine, losartan, meclizine, methotrexate, montelukast, multiple vitamins-calcium, mupirocin ointment, omega-3 acid ethyl esters, omeprazole, otezla, polyethyl glycol-propyl glycol, potassium chloride sa, tizanidine, triamcinolone cream, triamterene-hydrochlorothiazide, [DISCONTINUED] fluticasone, and [DISCONTINUED] mometasone. Her primarily concern today is the No chief complaint on file. ? ?Initial Vital Signs:  ?Pulse/HCG Rate: 95ECG Heart Rate: 75 ?Temp: 97.9 ?F (36.6 ?C) ?Resp: 18 ?BP: (!) 132/94 ?SpO2: 99 % ? ?BMI: Estimated body mass index is 36.82 kg/m? as calculated from the following: ?  Height as of this encounter: '5\' 11"'$  (1.803 m). ?  Weight as of this encounter: 264 lb (119.7 kg). ? ?Risk  Assessment: ?Allergies: Reviewed. She is allergic to ampicillin, penicillins, vancomycin, and vibramycin [doxycycline calcium].  ?Allergy Precautions: None required ?Coagulopathies: Reviewed. None identified.  ?Blood-thinner therapy: None at this time ?Active Infection(s): Reviewed. None identified. Michele Moore is afebrile ? ?Site Confirmation:  Michele Moore was asked to confirm the procedure and laterality before marking the site ?Procedure checklist: Completed ?Consent: Before the procedure and under the influence of no sedative(s), amnesic(s), or anxiolytics, the patient was informed of the treatment options, risks and possible complications. To fulfill our ethical and legal obligations, as recommended by the American Medical Association's Code of Ethics, I have informed the patient of my clinical impression; the nature and purpose of the treatment or procedure; the risks, benefits, and possible complications of the intervention; the alternatives, including doing nothing; the risk(s) and benefit(s) of the alternative treatment(s) or procedure(s); and the risk(s) and benefit(s) of doing nothing. ?The patient was provided information about the general risks and possible complications associated with the procedure. These may include, but are not limited to: failure to achieve desired goals, infection, bleeding, organ or nerve damage, allergic reactions, paralysis, and death. ?In addition, the patient was informed of those risks and complications associated to the procedure, such as failure to decrease pain; infection; bleeding; organ or nerve damage with subsequent damage to sensory, motor, and/or autonomic systems, resulting in permanent pain, numbness, and/or weakness of one or several areas of the body; allergic reactions; (i.e.: anaphylactic reaction); and/or death. ?Furthermore, the patient was informed of those risks and complications associated with the medications. These include, but are not limited to: allergic  reactions (i.e.: anaphylactic or anaphylactoid reaction(s)); adrenal axis suppression; blood sugar elevation that in diabetics may result in ketoacidosis or comma; water retention that in patients with history of congestive heart failure may result in shortness of breath, pulmonary edema, and decompensation with resultant heart failure; weight gain; swelling or edema; medication-induced neural toxicity; particulate matter embolism and blood vessel occlusion with resultant organ, and/or nervous system infarction; and/or aseptic necrosis of one or more joints. ?Finally, the patient was informed that Medicine is not an exact science; therefore, there is also the possibility of unforeseen or unpredictable risks and/or possible complications that may result in a catastrophic outcome. The patient indicated having understood very clearly. We have given the patient no guarantees and we have made no promises. Enough time was given to the patient to ask questions, all of which were answered to the patient's satisfaction. Ms. Fouty has indicated that she wanted to continue with the procedure. ?Attestation: I, the ordering provider, attest that I have discussed with the patient the benefits, risks, side-effects, alternatives, likelihood of achieving goals, and potential problems during recovery for the procedure that I have provided informed consent. ?Date  Time: 05/29/2021 10:15 AM ? ?Pre-Procedure Preparation:  ?Monitoring: As per clinic protocol. Respiration, ETCO2, SpO2, BP, heart rate and rhythm monitor placed and checked for adequate function ?Safety Precautions: Patient was assessed for positional comfort and pressure points before starting the procedure. ?Time-out: I initiated and conducted the "Time-out" before starting the procedure, as per protocol. The patient was asked to participate by confirming the accuracy of the "Time Out" information. Verification of the correct person, site, and procedure were performed and  confirmed by me, the nursing staff, and the patient. "Time-out" conducted as per Joint Commission's Universal Protocol ([id number].01.01.01). ?Time: 1051 ? ?Description of Procedure:          ?Target Area: For [place]

## 2021-05-29 NOTE — Telephone Encounter (Signed)
Voicemail left with patient that if she is concerned with her knee and bruising that we are glad for her to come back and let us visualize it.  Also, in case it may be the prep, take a warm wash cloth with some soap or hand sanitizer and wipe the knee down to see if in fact it is the prep.  Again, offered for her to come and let us assess the knee.  ?

## 2021-05-29 NOTE — Progress Notes (Signed)
Safety precautions to be maintained throughout the outpatient stay will include: orient to surroundings, keep bed in low position, maintain call bell within reach at all times, provide assistance with transfer out of bed and ambulation.  

## 2021-05-30 ENCOUNTER — Telehealth: Payer: Self-pay

## 2021-05-30 ENCOUNTER — Telehealth: Payer: Self-pay | Admitting: Student in an Organized Health Care Education/Training Program

## 2021-05-30 NOTE — Telephone Encounter (Signed)
Reports she is ok following procedure yesterday. The bruising to the leg that she called about yesterday is actually only the chloroprep. ?

## 2021-05-30 NOTE — Telephone Encounter (Signed)
Post procedure follow up phone call.  LM 

## 2021-06-04 ENCOUNTER — Emergency Department
Admission: EM | Admit: 2021-06-04 | Discharge: 2021-06-04 | Disposition: A | Discharge status: Home / Self Care | Payer: Medicare Other | Attending: Emergency Medicine | Admitting: Emergency Medicine

## 2021-06-04 ENCOUNTER — Other Ambulatory Visit: Payer: Self-pay

## 2021-06-04 ENCOUNTER — Emergency Department: Payer: Medicare Other

## 2021-06-04 DIAGNOSIS — M549 Dorsalgia, unspecified: Secondary | ICD-10-CM | POA: Diagnosis not present

## 2021-06-04 DIAGNOSIS — D631 Anemia in chronic kidney disease: Secondary | ICD-10-CM | POA: Diagnosis not present

## 2021-06-04 DIAGNOSIS — R778 Other specified abnormalities of plasma proteins: Secondary | ICD-10-CM | POA: Insufficient documentation

## 2021-06-04 DIAGNOSIS — M25519 Pain in unspecified shoulder: Secondary | ICD-10-CM | POA: Insufficient documentation

## 2021-06-04 DIAGNOSIS — K449 Diaphragmatic hernia without obstruction or gangrene: Secondary | ICD-10-CM | POA: Diagnosis not present

## 2021-06-04 DIAGNOSIS — I700 Atherosclerosis of aorta: Secondary | ICD-10-CM | POA: Diagnosis not present

## 2021-06-04 DIAGNOSIS — I491 Atrial premature depolarization: Secondary | ICD-10-CM | POA: Diagnosis not present

## 2021-06-04 DIAGNOSIS — R000 Tachycardia, unspecified: Secondary | ICD-10-CM | POA: Diagnosis not present

## 2021-06-04 DIAGNOSIS — N189 Chronic kidney disease, unspecified: Secondary | ICD-10-CM | POA: Insufficient documentation

## 2021-06-04 DIAGNOSIS — R0789 Other chest pain: Secondary | ICD-10-CM | POA: Diagnosis not present

## 2021-06-04 DIAGNOSIS — R0781 Pleurodynia: Secondary | ICD-10-CM | POA: Diagnosis not present

## 2021-06-04 DIAGNOSIS — R079 Chest pain, unspecified: Secondary | ICD-10-CM | POA: Insufficient documentation

## 2021-06-04 DIAGNOSIS — N184 Chronic kidney disease, stage 4 (severe): Secondary | ICD-10-CM | POA: Diagnosis not present

## 2021-06-04 DIAGNOSIS — N1832 Chronic kidney disease, stage 3b: Secondary | ICD-10-CM | POA: Diagnosis not present

## 2021-06-04 DIAGNOSIS — I10 Essential (primary) hypertension: Secondary | ICD-10-CM | POA: Diagnosis not present

## 2021-06-04 DIAGNOSIS — R008 Other abnormalities of heart beat: Secondary | ICD-10-CM | POA: Diagnosis not present

## 2021-06-04 DIAGNOSIS — N2581 Secondary hyperparathyroidism of renal origin: Secondary | ICD-10-CM | POA: Diagnosis not present

## 2021-06-04 DIAGNOSIS — I4891 Unspecified atrial fibrillation: Secondary | ICD-10-CM | POA: Diagnosis not present

## 2021-06-04 DIAGNOSIS — R809 Proteinuria, unspecified: Secondary | ICD-10-CM | POA: Diagnosis not present

## 2021-06-04 DIAGNOSIS — I2510 Atherosclerotic heart disease of native coronary artery without angina pectoris: Secondary | ICD-10-CM | POA: Diagnosis not present

## 2021-06-04 LAB — CBC
HCT: 30.5 % — ABNORMAL LOW (ref 36.0–46.0)
Hemoglobin: 9.7 g/dL — ABNORMAL LOW (ref 12.0–15.0)
MCH: 29.5 pg (ref 26.0–34.0)
MCHC: 31.8 g/dL (ref 30.0–36.0)
MCV: 92.7 fL (ref 80.0–100.0)
Platelets: 223 10*3/uL (ref 150–400)
RBC: 3.29 MIL/uL — ABNORMAL LOW (ref 3.87–5.11)
RDW: 15.1 % (ref 11.5–15.5)
WBC: 9.3 10*3/uL (ref 4.0–10.5)
nRBC: 0 % (ref 0.0–0.2)

## 2021-06-04 LAB — BASIC METABOLIC PANEL
Anion gap: 7 (ref 5–15)
BUN: 25 mg/dL — ABNORMAL HIGH (ref 8–23)
CO2: 26 mmol/L (ref 22–32)
Calcium: 9.8 mg/dL (ref 8.9–10.3)
Chloride: 104 mmol/L (ref 98–111)
Creatinine, Ser: 1.67 mg/dL — ABNORMAL HIGH (ref 0.44–1.00)
GFR, Estimated: 32 mL/min — ABNORMAL LOW (ref >60)
Glucose, Bld: 66 mg/dL — ABNORMAL LOW (ref 70–99)
Potassium: 3.7 mmol/L (ref 3.5–5.1)
Sodium: 137 mmol/L (ref 135–145)

## 2021-06-04 LAB — TROPONIN I (HIGH SENSITIVITY)
Troponin I (High Sensitivity): 24 ng/L — ABNORMAL HIGH (ref <18)
Troponin I (High Sensitivity): 28 ng/L — ABNORMAL HIGH (ref <18)

## 2021-06-04 LAB — D-DIMER, QUANTITATIVE: D-Dimer, Quant: 2.49 ug/mL-FEU — ABNORMAL HIGH (ref 0.00–0.50)

## 2021-06-04 MED ORDER — ALBUTEROL SULFATE HFA 108 (90 BASE) MCG/ACT IN AERS: 2.0000 | INHALATION_SPRAY | Freq: Four times a day (QID) | RESPIRATORY_TRACT | 2 refills | Status: AC | PRN

## 2021-06-04 MED ORDER — IOHEXOL 350 MG/ML SOLN
60.0000 mL | Freq: Once | INTRAVENOUS | Status: AC | PRN
  Administered 2021-06-04: 60 mL via INTRAVENOUS

## 2021-06-04 MED ORDER — SODIUM CHLORIDE 0.9 % IV BOLUS
1000.0000 mL | Freq: Once | INTRAVENOUS | Status: AC
  Administered 2021-06-04: 1000 mL via INTRAVENOUS

## 2021-06-04 MED ORDER — AZITHROMYCIN 250 MG PO TABS: ORAL_TABLET | ORAL | 0 refills | Status: AC

## 2021-06-04 NOTE — Discharge Instructions (Addendum)
Please be sure to follow up with your nephrologist and the cardiology appointment scheduled. Please seek medical attention for any high fevers, chest pain, shortness of breath, change in behavior, persistent vomiting, bloody stool or any other new or concerning symptoms. ? ?

## 2021-06-04 NOTE — ED Provider Notes (Signed)
? ?[facility] ?Provider Note ? ? ? Event Date/Time  ? First MD Initiated Contact with Patient 06/04/21 1852   ?  (approximate) ? ? ?History  ? ?Chest Pain ? ? ?HPI ? ?Michele Moore is a 71 y.o. female  who presents to the emergency department today because of concern for this arrhythmia.  Patient presented to the emergency department today from nephrology clinic.  She had been going there for follow-up for her chronic kidney disease.  However while there she was noted to have an abnormal rhythm to her heart.  Her heart rate then jumped all the way up into the 150s.  Patient did start complaining of some chest pain at that time.  When EMS arrived they were able to get a twelve-lead which showed A-fib.  The patient states that she has also been having some orthopedic pain.  Has recently had injections to her left knee.  She also is complaining of shoulder and back pain. ? ? ?Physical Exam  ? ?Triage Vital Signs: ?ED Triage Vitals  ?Enc Vitals Group  ?   BP 06/04/21 1635 (!) 156/99  ?   Pulse Rate 06/04/21 1635 90  ?   Resp 06/04/21 1635 20  ?   Temp 06/04/21 1635 98.2 ?F (36.8 ?C)  ?   Temp Source 06/04/21 1635 Oral  ?   SpO2 06/04/21 1635 100 %  ?   Weight 06/04/21 1634 249 lb (112.9 kg)  ?   Height --   ?   Head Circumference --   ?   Peak Flow --   ?   Pain Score 06/04/21 1634 7  ?   Pain Loc --   ?   Pain Edu? --   ?   Excl. in [place]? --   ? ? ?Most recent vital signs: ?Vitals:  ? 06/04/21 1635  ?BP: (!) 156/99  ?Pulse: 90  ?Resp: 20  ?Temp: 98.2 ?F (36.8 ?C)  ?SpO2: 100%  ? ? ?General: Awake, no distress.  ?CV:  Good peripheral perfusion. Irregular rhythm. ?Resp:  Normal effort. Lungs clear to auscultation ?Abd:  No distention.  ? ? ?ED Results / Procedures / Treatments  ? ?Labs ?(all labs ordered are listed, but only abnormal results are displayed) ?Labs Reviewed  ?BASIC METABOLIC PANEL - Abnormal; Notable for the following components:  ?    Result Value  ? Glucose, Bld 66 (*)   ? BUN 25 (*)    ? Creatinine, Ser 1.67 (*)   ? GFR, Estimated 32 (*)   ? All other components within normal limits  ?CBC - Abnormal; Notable for the following components:  ? RBC 3.29 (*)   ? Hemoglobin 9.7 (*)   ? HCT 30.5 (*)   ? All other components within normal limits  ?TROPONIN I (HIGH SENSITIVITY) - Abnormal; Notable for the following components:  ? Troponin I (High Sensitivity) 24 (*)   ? All other components within normal limits  ?TROPONIN I (HIGH SENSITIVITY)  ? ?EKG ? ?INance Pear, attending physician, personally viewed and interpreted this EKG ? ?EKG Time: 1637 ?Rate: 82 ?Rhythm: sinus rhythm with PVC ?Axis: normal ?Intervals: qtc 397 ?QRS: narrow ?ST changes: no st elevation ?Impression: abnormal ekg ? ?INance Pear, attending physician, personally viewed and interpreted this EKG ? ?EKG Time: 2241 ?Rate: 66 ?Rhythm: sinus rhythm ?Axis: normal ?Intervals: qtc 418 ?QRS: narrow ?ST changes: no st elevation ?Impression: normal ekg ? ?RADIOLOGY ?I independently interpreted and visualized the  cxr. My interpretation: No pneumonia. No pneumothorax ?Radiology interpretation:  ?  ?IMPRESSION:  ?No active cardiopulmonary disease.  ? ?CT angio ?IMPRESSION:  ?1. No CT findings for pulmonary embolism.  ?2. Normal caliber thoracic aorta.  ?3. Stable appearing mosaic pattern of ground-glass attenuation most  ?likely due to small airways disease such as asthma respiratory  ?bronchiolitis. Other possibilities would include hypersensitivity  ?pneumonitis and cryptogenic organizing pneumonia. No focal pulmonary  ?infiltrates or worrisome pulmonary lesions.  ? ? ? ?PROCEDURES: ? ?Critical Care performed: No ? ?Procedures ? ? ?MEDICATIONS ORDERED IN ED: ?Medications - No data to display ? ? ?IMPRESSION / MDM / ASSESSMENT AND PLAN / ED COURSE  ?I reviewed the triage vital signs and the nursing notes. ?             ?               ? ?Differential diagnosis includes, but is not limited to, abnormal rhythm, electrolyte abnormality,  pneumonia, acs.  ? ?Patient presented to the emergency department today because of concerns for abnormal rhythm.  Patient is coming from nephrology office.  I did speak to Dr. Holley Raring the nephrologist over the telephone.  He states that when he went to examine her he noticed that her heart rate was irregular.  When EMS came and took a twelve-lead apparently it did appear to be A-fib with RVR.  Dr. Holley Raring said that heart rates went up into the 150s.  Initial EKG here shows what appears to be sinus rhythm with frequent PVCs.  Patient did complain of some chest pain.  Troponin while minimally elevated did not have any significant change on repeat.  D-dimer was elevated raising concern for possible PE.  CT angio was obtained.  Fortunately does not show any PE.  Did show a pattern consistent with probably bronchitis or potentially an abnormal pneumonia.  No concerning leukocytosis here on blood work.  Patient's rhythm did improve while here in the emergency department without any specific therapy.  EKG repeated prior to discharge showed a normal sinus rhythm without any mature beats.  At this time I think it is unclear why patient's heart rate was still elevated earlier today.  However patient does feel somewhat improved.  Her heart rate is certainly improved.  I think would be reasonable at this time for patient be discharged to follow-up with cardiology as outpatient.  Discussed this with patient. ? ?FINAL CLINICAL IMPRESSION(S) / ED DIAGNOSES  ? ?Final diagnoses:  ?Nonspecific chest pain  ? ? ? ? ? ?Note:  This document was prepared using Dragon voice recognition software and may include unintentional dictation errors. ? ?  ?Nance Pear, MD ?06/04/21 2305 ? ?

## 2021-06-04 NOTE — ED Triage Notes (Signed)
Pt comes ems from doctor's office for chest pain. Has been going on for the past week. Worse with coughing and deep breathing. New onset afib on EKG. With IV start, pt converted to a NSR with PVCs. 20G L AC.  ?

## 2021-06-04 NOTE — ED Notes (Signed)
Urine sent to lab 

## 2021-06-13 ENCOUNTER — Ambulatory Visit (INDEPENDENT_AMBULATORY_CARE_PROVIDER_SITE_OTHER): Payer: Medicare Other | Admitting: Internal Medicine

## 2021-06-13 ENCOUNTER — Encounter: Payer: Self-pay | Admitting: Internal Medicine

## 2021-06-13 VITALS — BP 120/68 | HR 64 | Ht 71.0 in | Wt 269.8 lb

## 2021-06-13 DIAGNOSIS — R002 Palpitations: Secondary | ICD-10-CM | POA: Diagnosis not present

## 2021-06-13 DIAGNOSIS — Z20822 Contact with and (suspected) exposure to covid-19: Secondary | ICD-10-CM | POA: Diagnosis not present

## 2021-06-13 NOTE — Progress Notes (Signed)
? ? ? ? ?ELECTROPHYSIOLOGY CONSULT NOTE  ?Patient ID: Michele Moore, MRN: [id number], DOB/AGE: 1950/10/03 71 y.o. ?Admit date: (Not on file) ?Date of Consult: 06/13/2021 ? ?Primary Physician: Juline Patch, MD ?Primary Cardiologist: new ?  ?  ?Michele Moore is a 71 y.o. female who is being seen today for the evaluation of AFib  at the request of Dr Holley Raring.  ? ? ?HPI ?Michele Moore is a 70 y.o. female referred following a visit to the emergency room where she had been referred by Dr. Holley Raring when he found her heart rhythm to be abnormal.  Apparently ECG by EMS demonstrated atrial fibrillation, but ECG in the emergency room showed sinus rhythm with ectopy. ? ?She has some flutters.  Significant dyspnea on exertion.  3 pillow orthopnea some peripheral edema occasional chest pains.  No syncope. ? ?D-dimer > 2.4 (CTA neg for PE but >> small vessel disease)  ? ?Takes MTX rheumatoid arthritis ? ?DATE TEST EF   ?8/20 Myoview   55-60 % Normal perfusion   ?8/20 Echo   55-60 %   ?     ? ?Date Cr K Hgb  ?3/23 1.67 3.7  9.7  ?      ? ?Thromboembolic risk factors ( age -47, HTN-1 , CHF-1, Gender-1) for a CHADSVASc Score of >=4 ? ? ? ?Past Medical History:  ?Diagnosis Date  ? Allergy   ? Anemia   ? Arthritis   ? Asthma   ? Cataract   ? Chronic kidney disease   ? STAGE 3 PER DR EASON 08/02/15  ? Collagen vascular disease ([place])   ? Complication of anesthesia   ? WOKE UP DURING COLONOSCOPY  ? Dyspnea   ? Dysrhythmia   ? IRREGULAR HEART BEAT  ? Family history of adverse reaction to anesthesia   ? sister difficult to put to sleep  ? GERD (gastroesophageal reflux disease)   ? Glaucoma   ? H/O tooth extraction   ? all lower teeth 1/19  ? Hemorrhoid   ? History of hiatal hernia   ? History of kidney stones   ? H/O  ? Hypertension   ? Hypothyroidism   ? Migraines   ? Mixed hyperlipidemia   ? Multiple gastric ulcers   ? Thyroid disease   ? Wears dentures   ? partial upper and lower  ?   ? ?Surgical History:  ?Past Surgical History:  ?Procedure  Laterality Date  ? ANKLE SURGERY    ? CARPAL TUNNEL RELEASE    ? x3  ? COLONOSCOPY  2000?  ? COLONOSCOPY WITH PROPOFOL N/A 10/22/2017  ? Procedure: COLONOSCOPY WITH PROPOFOL;  Surgeon: Lucilla Lame, MD;  Location: [facility];  Service: Endoscopy;  Laterality: N/A;  ? ESOPHAGOGASTRODUODENOSCOPY (EGD) WITH PROPOFOL N/A 10/22/2017  ? Procedure: ESOPHAGOGASTRODUODENOSCOPY (EGD) WITH PROPOFOL;  Surgeon: Lucilla Lame, MD;  Location: [facility];  Service: Endoscopy;  Laterality: N/A;  ? FUSION OF TALONAVICULAR JOINT Right 08/03/2015  ? Procedure: TAILOR NAVICULAR JOINT FUSION - RIGHT ;  Surgeon: [name], DPM;  Location: [facility];  Service: Podiatry;  Laterality: Right;  ? JOINT REPLACEMENT Right   ? knee x 3 ,right x1 and left x2  ? POLYPECTOMY N/A 10/22/2017  ? Procedure: POLYPECTOMY;  Surgeon: Lucilla Lame, MD;  Location: [facility];  Service: Endoscopy;  Laterality: N/A;  ? ROTATOR CUFF REPAIR    ? TONSILLECTOMY    ? TOTAL KNEE REVISION Right 01/20/2018  ? Procedure: POLYETHYLENE EXCHANGE;  Surgeon: Dereck Leep, MD;  Location: [facility];  Service: Orthopedics;  Laterality: Right;  ? TUBAL LIGATION    ? UPPER GI ENDOSCOPY  2000?  ?  ? ?Home Meds: ?Current Meds  ?Medication Sig  ? albuterol (VENTOLIN HFA) 108 (90 Base) MCG/ACT inhaler INHALE 2 PUFFS INTO THE LUNGS EVERY 4 HOURS AS NEEDED FOR WHEEZING  ? albuterol (VENTOLIN HFA) 108 (90 Base) MCG/ACT inhaler Inhale 2 puffs into the lungs every 6 (six) hours as needed for wheezing or shortness of breath.  ? allopurinol (ZYLOPRIM) 100 MG tablet Take 200 mg by mouth every morning.   ? aspirin EC 81 MG tablet Take 81 mg by mouth daily. Swallow whole.  ? betamethasone dipropionate 0.05 % cream APPLY TO PSORIASIS ON HANDS TWICE DAILY ONLY. AVOID FACE, GROIN AND AXILLA  ? betamethasone valerate (VALISONE) 0.1 % cream Apply topically 2 (two) times daily as needed (Rash).  ? calcipotriene (DOVONOX) 0.005 % cream APPLY TOPICALLY TO PSORIASIS AREAS ON  LEGS AND FEET ONCE OR TWICE DAILY  ? Cholecalciferol (VITAMIN D3) 2000 units TABS Take 2,000 Units by mouth daily.  ? diclofenac sodium (VOLTAREN) 1 % GEL Apply 2 g topically 3 (three) times daily.   ? ferrous sulfate 325 (65 FE) MG tablet Take 325 mg by mouth daily with breakfast.   ? gabapentin (NEURONTIN) 600 MG tablet Take 1 tablet (600 mg total) by mouth at bedtime.  ? hydrALAZINE (APRESOLINE) 50 MG tablet Take 1 tablet by mouth twice daily  ? HYDROcodone-acetaminophen (NORCO) 7.5-325 MG tablet Take 1 tablet by mouth every 8 (eight) hours as needed for severe pain. Must last 30 days.  ? [START ON 06/24/2021] HYDROcodone-acetaminophen (NORCO) 7.5-325 MG tablet Take 1 tablet by mouth every 8 (eight) hours as needed for severe pain. Must last 30 days.  ? [START ON 07/24/2021] HYDROcodone-acetaminophen (NORCO) 7.5-325 MG tablet Take 1 tablet by mouth every 8 (eight) hours as needed for severe pain. Must last 30 days.  ? ketoconazole (NIZORAL) 2 % cream Apply topically daily as needed.  ? levothyroxine (SYNTHROID) 175 MCG tablet Take 1 tablet (175 mcg total) by mouth daily.  ? losartan (COZAAR) 25 MG tablet Take 1 tablet (25 mg total) by mouth every morning.  ? meclizine (ANTIVERT) 12.5 MG tablet TAKE 1 TABLET(12.5 MG) BY MOUTH THREE TIMES DAILY AS NEEDED FOR DIZZINESS  ? methotrexate (RHEUMATREX) 2.5 MG tablet Take 7.5 mg by mouth every Friday. Take 5 tablets by mouth once weekly  ? montelukast (SINGULAIR) 10 MG tablet TAKE 1 TABLET(10 MG) BY MOUTH AT BEDTIME  ? Multiple Vitamins-Calcium (ONE-A-DAY WOMENS FORMULA PO) Take 1 tablet by mouth daily.  ? mupirocin ointment (BACTROBAN) 2 % Apply 1 application topically 2 (two) times daily.  ? omega-3 acid ethyl esters (LOVAZA) 1 g capsule Take 1 capsule (1 g total) by mouth 2 (two) times daily.  ? omeprazole (PRILOSEC) 40 MG capsule TAKE 1 CAPSULE(40 MG) BY MOUTH DAILY  ? OTEZLA 30 MG TABS Take 1 tablet by mouth daily as needed.  ? Polyethyl Glycol-Propyl Glycol (SYSTANE  OP) Place 1 drop into both eyes daily as needed (dry eyes).   ? potassium chloride SA (KLOR-CON M) 20 MEQ tablet Take 0.5 tablets (10 mEq total) by mouth daily.  ? tiZANidine (ZANAFLEX) 4 MG tablet Take 1 tablet (4 mg total) by mouth at bedtime.  ? triamcinolone (KENALOG) 0.1 % Apply 1 application topically 2 (two) times daily.  ? triamterene-hydrochlorothiazide (MAXZIDE) 75-50 MG tablet Take 1 tablet by  mouth daily.  ? ? ?Allergies:  ?Allergies  ?Allergen Reactions  ? Ampicillin Swelling  ? Penicillins Anaphylaxis and Swelling  ?  IgE = 10 (11/05/2017) ? ?Has patient had a PCN reaction causing immediate rash, facial/tongue/throat swelling, SOB or lightheadedness with hypotension: Yes ?Has patient had a PCN reaction causing severe rash involving mucus membranes or skin necrosis: No ?Has patient had a PCN reaction that required hospitalization: No ?Has patient had a PCN reaction occurring within the last 10 years: Yes ?If all of the above answers are "NO", then may proceed with Cephalosporin use.  ? Vancomycin   ?  Other reaction(s): Other (see comments)  ? Vibramycin [Doxycycline Calcium] Rash  ? ? ?Social History  ? ?Socioeconomic History  ? Marital status: Divorced  ?  Spouse name: Not on file  ? Number of children: 4  ? Years of education: Not on file  ? Highest education level: 9th grade  ?Occupational History  ? Occupation: retired  ?Tobacco Use  ? Smoking status: Former  ?  Packs/day: 2.00  ?  Years: 20.00  ?  Pack years: 40.00  ?  Types: Cigarettes  ?  Quit date: 02/24/1993  ?  Years since quitting: 28.3  ? Smokeless tobacco: Never  ?Vaping Use  ? Vaping Use: Never used  ?Substance and Sexual Activity  ? Alcohol use: No  ?  Alcohol/week: 0.0 standard drinks  ? Drug use: No  ? Sexual activity: Not Currently  ?Other Topics Concern  ? Not on file  ?Social History Narrative  ? Pt lives alone  ? ?Social Determinants of Health  ? ?Financial Resource Strain: Low Risk   ? Difficulty of Paying Living Expenses: Not very  hard  ?Food Insecurity: No Food Insecurity  ? Worried About Charity fundraiser in the Last Year: Never true  ? Ran Out of Food in the Last Year: Never true  ?Transportation Needs: No Transportation Needs  ?

## 2021-06-13 NOTE — Patient Instructions (Signed)
Medication Instructions:  ?- Your physician recommends that you continue on your current medications as directed. Please refer to the Current Medication list given to you today. ? ?*If you need a refill on your cardiac medications before your next appointment, please call your pharmacy* ? ? ?Lab Work: ?- none ordered ? ?If you have labs (blood work) drawn today and your tests are completely normal, you will receive your results only by: ?MyChart Message (if you have MyChart) OR ?A paper copy in the mail ?If you have any lab test that is abnormal or we need to change your treatment, we will call you to review the results. ? ? ?Testing/Procedures: ?1) Heart Monitor (28 days total) ? ?Wear time: 28 days (you will have TWO 14 day monitor) ?Placement: will be mailed to you within 3-5 business days ? ?Your physician has recommended that you wear a Zio monitor.  ? ?This monitor is a medical device that records the heart?s electrical activity. Doctors most often use these monitors to diagnose arrhythmias. Arrhythmias are problems with the speed or rhythm of the heartbeat. The monitor is a small device applied to your chest. You can wear one while you do your normal daily activities. While wearing this monitor if you have any symptoms to push the button and record what you felt. Once you have worn this monitor for the period of time provider prescribed (Usually 14 days), you will return the monitor device in the postage paid box. Once it is returned they will download the data collected and provide [place] with a report which the provider will then review and we will call you with those results. Important tips: ? ?Avoid showering during the first 24 hours of wearing the monitor. ?Avoid excessive sweating to help maximize wear time. ?Do not submerge the device, no hot tubs, and no swimming pools. ?Keep any lotions or oils away from the patch. ?After 24 hours you may shower with the patch on. Take brief showers with your back  facing the shower head.  ?Do not remove patch once it has been placed because that will interrupt data and decrease adhesive wear time. ?Push the button when you have any symptoms and write down what you were feeling. ?Once you have completed wearing your monitor, remove and place into box which has postage paid and place in your outgoing mailbox.  ?If for some reason you have misplaced your box then call our office and we can provide another box and/or mail it off for you. ? ? ? ? ? ?Follow-Up: ?At [facility], you and your health needs are our priority.  As part of our continuing mission to provide you with exceptional heart care, we have created designated Provider Care Teams.  These Care Teams include your primary Cardiologist (physician) and Advanced Practice Providers (APPs -  Physician Assistants and Nurse Practitioners) who all work together to provide you with the care you need, when you need it. ? ?We recommend signing up for the patient portal called "MyChart".  Sign up information is provided on this After Visit Summary.  MyChart is used to connect with patients for Virtual Visits (Telemedicine).  Patients are able to view lab/test results, encounter notes, upcoming appointments, etc.  Non-urgent messages can be sent to your provider as well.   ?To learn more about what you can do with MyChart, go to [email or web address].   ? ?Your next appointment:   ?2 month(s) ? ?The format for your next appointment:   ?In Person ? ?  Provider:   ?Virl Axe, MD  ? ? ?Other Instructions ?N/a ? ?Important Information About Sugar ? ? ? ? ? ? ?

## 2021-06-14 ENCOUNTER — Ambulatory Visit (INDEPENDENT_AMBULATORY_CARE_PROVIDER_SITE_OTHER): Payer: Medicare Other

## 2021-06-14 DIAGNOSIS — R002 Palpitations: Secondary | ICD-10-CM | POA: Diagnosis not present

## 2021-06-20 DIAGNOSIS — Z20822 Contact with and (suspected) exposure to covid-19: Secondary | ICD-10-CM | POA: Diagnosis not present

## 2021-06-25 ENCOUNTER — Telehealth: Payer: Self-pay | Admitting: Internal Medicine

## 2021-06-25 NOTE — Telephone Encounter (Signed)
I spoke with the patient. ?She advised that she received her ZIO monitor last Wednesday and took it off today as the orange light kept flashing and it was breaking out her skin.  ?She called [employer] and they advised her to send this back. ?She will place in the mail tomorrow. ? ?The original plan, per Dr. Caryl Comes, was to have the patient wear 2- 14 day ZIO's. ? ?I advised the patient I will notify Dr. Caryl Comes that she is unable to wear the monitor. ?We will follow up with her once the report has been finalized for the time she did wear it. ?I will call her back with further MD recommendations after that.  ? ?The patient voices understanding and is agreeable. ? ? ?

## 2021-06-25 NOTE — Telephone Encounter (Signed)
Pt called stating that heart monitor keeps going off and said she called customer service and they told her to remove it and send it back. Pt also states that heart monitor was also breaking her skin out.  ?

## 2021-06-27 ENCOUNTER — Encounter: Payer: Self-pay | Admitting: Student in an Organized Health Care Education/Training Program

## 2021-06-27 NOTE — Telephone Encounter (Signed)
It does not look like she is going to be able to wear monitor, I do not know whether the hypoallergenic patches might work from [employer], we could ask Michele Moore and see what she thinks.  The alternative would be to try and find an Audiological scientist.  I am looking to see what the cheapest thing is that could do that reliably ?

## 2021-07-01 ENCOUNTER — Other Ambulatory Visit: Payer: Self-pay

## 2021-07-01 ENCOUNTER — Ambulatory Visit
Payer: Medicare Other | Attending: Student in an Organized Health Care Education/Training Program | Admitting: Student in an Organized Health Care Education/Training Program

## 2021-07-01 ENCOUNTER — Inpatient Hospital Stay: Payer: Medicare Other | Attending: Oncology

## 2021-07-01 ENCOUNTER — Encounter: Payer: Self-pay | Admitting: Student in an Organized Health Care Education/Training Program

## 2021-07-01 DIAGNOSIS — Z86010 Personal history of colonic polyps: Secondary | ICD-10-CM | POA: Insufficient documentation

## 2021-07-01 DIAGNOSIS — M25562 Pain in left knee: Secondary | ICD-10-CM | POA: Insufficient documentation

## 2021-07-01 DIAGNOSIS — M1712 Unilateral primary osteoarthritis, left knee: Secondary | ICD-10-CM | POA: Diagnosis not present

## 2021-07-01 DIAGNOSIS — G8929 Other chronic pain: Secondary | ICD-10-CM | POA: Diagnosis not present

## 2021-07-01 DIAGNOSIS — Z79631 Long term (current) use of antimetabolite agent: Secondary | ICD-10-CM | POA: Insufficient documentation

## 2021-07-01 DIAGNOSIS — N183 Chronic kidney disease, stage 3 unspecified: Secondary | ICD-10-CM

## 2021-07-01 DIAGNOSIS — E039 Hypothyroidism, unspecified: Secondary | ICD-10-CM | POA: Insufficient documentation

## 2021-07-01 DIAGNOSIS — G894 Chronic pain syndrome: Secondary | ICD-10-CM | POA: Diagnosis not present

## 2021-07-01 DIAGNOSIS — M069 Rheumatoid arthritis, unspecified: Secondary | ICD-10-CM | POA: Insufficient documentation

## 2021-07-01 DIAGNOSIS — I129 Hypertensive chronic kidney disease with stage 1 through stage 4 chronic kidney disease, or unspecified chronic kidney disease: Secondary | ICD-10-CM | POA: Insufficient documentation

## 2021-07-01 DIAGNOSIS — Z87891 Personal history of nicotine dependence: Secondary | ICD-10-CM | POA: Insufficient documentation

## 2021-07-01 DIAGNOSIS — K219 Gastro-esophageal reflux disease without esophagitis: Secondary | ICD-10-CM | POA: Insufficient documentation

## 2021-07-01 DIAGNOSIS — Z96652 Presence of left artificial knee joint: Secondary | ICD-10-CM | POA: Insufficient documentation

## 2021-07-01 DIAGNOSIS — Z79899 Other long term (current) drug therapy: Secondary | ICD-10-CM | POA: Insufficient documentation

## 2021-07-01 DIAGNOSIS — Z87442 Personal history of urinary calculi: Secondary | ICD-10-CM | POA: Insufficient documentation

## 2021-07-01 DIAGNOSIS — D631 Anemia in chronic kidney disease: Secondary | ICD-10-CM | POA: Insufficient documentation

## 2021-07-01 DIAGNOSIS — K449 Diaphragmatic hernia without obstruction or gangrene: Secondary | ICD-10-CM | POA: Insufficient documentation

## 2021-07-01 DIAGNOSIS — N1832 Chronic kidney disease, stage 3b: Secondary | ICD-10-CM | POA: Insufficient documentation

## 2021-07-01 DIAGNOSIS — Z8051 Family history of malignant neoplasm of kidney: Secondary | ICD-10-CM | POA: Insufficient documentation

## 2021-07-01 DIAGNOSIS — E785 Hyperlipidemia, unspecified: Secondary | ICD-10-CM | POA: Insufficient documentation

## 2021-07-01 DIAGNOSIS — M3500 Sicca syndrome, unspecified: Secondary | ICD-10-CM | POA: Insufficient documentation

## 2021-07-01 LAB — CBC WITH DIFFERENTIAL/PLATELET
Abs Immature Granulocytes: 0.01 10*3/uL (ref 0.00–0.07)
Basophils Absolute: 0 10*3/uL (ref 0.0–0.1)
Basophils Relative: 1 %
Eosinophils Absolute: 0.2 10*3/uL (ref 0.0–0.5)
Eosinophils Relative: 3 %
HCT: 29.6 % — ABNORMAL LOW (ref 36.0–46.0)
Hemoglobin: 9.6 g/dL — ABNORMAL LOW (ref 12.0–15.0)
Immature Granulocytes: 0 %
Lymphocytes Relative: 25 %
Lymphs Abs: 1.3 10*3/uL (ref 0.7–4.0)
MCH: 30.1 pg (ref 26.0–34.0)
MCHC: 32.4 g/dL (ref 30.0–36.0)
MCV: 92.8 fL (ref 80.0–100.0)
Monocytes Absolute: 0.4 10*3/uL (ref 0.1–1.0)
Monocytes Relative: 8 %
Neutro Abs: 3.2 10*3/uL (ref 1.7–7.7)
Neutrophils Relative %: 63 %
Platelets: 192 10*3/uL (ref 150–400)
RBC: 3.19 MIL/uL — ABNORMAL LOW (ref 3.87–5.11)
RDW: 14.8 % (ref 11.5–15.5)
WBC: 5.1 10*3/uL (ref 4.0–10.5)
nRBC: 0 % (ref 0.0–0.2)

## 2021-07-01 LAB — FERRITIN: Ferritin: 85 ng/mL (ref 11–307)

## 2021-07-01 LAB — IRON AND TIBC
Iron: 42 ug/dL (ref 28–170)
Saturation Ratios: 14 % (ref 10.4–31.8)
TIBC: 291 ug/dL (ref 250–450)
UIBC: 249 ug/dL

## 2021-07-01 NOTE — Progress Notes (Signed)
Patient: Michele Moore  Service Category: E/M  Provider: Gillis Santa, MD  ?DOB: 03/03/1950  DOS: 07/01/2021  Location: Office  ?MRN: [id number]  Setting: Ambulatory outpatient  Referring Provider: Juline Patch, MD  ?Type: Established Patient  Specialty: Interventional Pain Management  PCP: Juline Patch, MD  ?Location: Remote location  Delivery: TeleHealth    ? ?Virtual Encounter - Pain Management ?PROVIDER NOTE: Information contained herein reflects review and annotations entered in association with encounter. Interpretation of such information and data should be left to medically-trained personnel. Information provided to patient can be located elsewhere in the medical record under "Patient Instructions". Document created using STT-dictation technology, any transcriptional errors that may result from process are unintentional.  ?  ?Contact & Pharmacy ?Preferred: [phone] ?Home: [phone] (home) ?Mobile: [phone] (mobile) ?E-mail: No e-mail address on record  ?[facility] ?[place] [address] ?Phone: [phone] Fax: [phone] ?  ?Pre-screening  ?[name] offered "in-person" vs "virtual" encounter. She indicated preferring virtual for this encounter.  ? ?Reason ?COVID-19*  Social distancing based on CDC and AMA recommendations.  ? ?I contacted Michele Moore on 07/01/2021 via telephone.      I clearly identified myself as Gillis Santa, MD. I verified that I was speaking with the correct person using two identifiers (Name: JOHNA KEARL, and date of birth: 1951/02/21). ? ?Consent ?I sought verbal advanced consent from Michele Moore for virtual visit interactions. I informed Ms. Cyphers of possible security and privacy concerns, risks, and limitations associated with providing "not-in-person" medical evaluation and management services. I also informed Ms. Belleau of the availability of "in-person" appointments.  Finally, I informed her that there would be a charge for the virtual visit and that she could be  personally, fully or partially, financially responsible for it. Ms. Stallone expressed understanding and agreed to proceed.  ? ?Historic Elements   ?Ms. MARENA Moore is a 71 y.o. year old, female patient evaluated today after our last contact on 05/30/2021. [name]  has a past medical history of Allergy, Anemia, Arthritis, Asthma, Cataract, Chronic kidney disease, Collagen vascular disease ([place]), Complication of anesthesia, Dyspnea, Dysrhythmia, Family history of adverse reaction to anesthesia, GERD (gastroesophageal reflux disease), Glaucoma, H/O tooth extraction, Hemorrhoid, History of hiatal hernia, History of kidney stones, Hypertension, Hypothyroidism, Migraines, Mixed hyperlipidemia, Multiple gastric ulcers, Thyroid disease, and Wears dentures. She also  has a past surgical history that includes Carpal tunnel release; Rotator cuff repair; Ankle surgery; Tubal ligation; Colonoscopy (2000?); Upper gi endoscopy (2000?); Tonsillectomy; Fusion of talonavicular joint (Right, 08/03/2015); Colonoscopy with propofol (N/A, 10/22/2017); Esophagogastroduodenoscopy (egd) with propofol (N/A, 10/22/2017); polypectomy (N/A, 10/22/2017); Total knee revision (Right, 01/20/2018); and Joint replacement (Right). [name] has a current medication list which includes the following prescription(s): albuterol, albuterol, allopurinol, aspirin ec, betamethasone dipropionate, betamethasone valerate, calcipotriene, vitamin d3, clobetasol ointment, diclofenac sodium, ferrous sulfate, gabapentin, hydralazine, hydrocodone-acetaminophen, [START ON 07/24/2021] hydrocodone-acetaminophen, ketoconazole, levothyroxine, losartan, meclizine, methotrexate, montelukast, multiple vitamins-calcium, mupirocin ointment, omega-3 acid ethyl esters, omeprazole, otezla, polyethyl glycol-propyl glycol, potassium chloride sa, tizanidine, triamcinolone cream,  triamterene-hydrochlorothiazide, hydrocodone-acetaminophen, [DISCONTINUED] fluticasone, and [DISCONTINUED] mometasone. She  reports that she quit smoking about 28 years ago. Her smoking use included cigarettes. She has a 40.00 pack-year smoking history. She has never used smokeless tobacco. She reports that she does not drink alcohol and does not use drugs. Ms. Bribiesca is allergic to ampicillin,  penicillins, vancomycin, and vibramycin [doxycycline calcium].  ? ?HPI  ?Today, she is being contacted for a post-procedure assessment. ? ? ?Post-procedure evaluation  ?  ?Procedure:          Anesthesia, Analgesia, Anxiolysis:  ?Type: Therapeutic Superolateral, Superomedial, and Inferomedial, Genicular Nerve Radiofrequency Ablation (destruction).   #1  ?Region: Lateral, Anterior, and Medial aspects of the knee joint, above and below the knee joint proper. ?Level: Superior and inferior to the knee joint. ?Laterality: Left  Anesthesia: Local (1-2% Lidocaine)  ?Anxiolysis: IV Versed x 1 mg   ?Sedation: Minimal  ?Guidance: Fluoroscopy         ? ? ?Position: Supine  ? ?Indications: ?1. Arthritis of left knee   ?2. Chronic knee pain after total replacement of left knee joint   ?3. Chronic pain syndrome   ? ?[name] has been dealing with the above chronic pain for longer than three months and has either failed to respond, was unable to tolerate, or simply did not get enough benefit from other more conservative therapies including, but not limited to: ?1. Over-the-counter medications ?2. Anti-inflammatory medications ?3. Muscle relaxants ?4. Membrane stabilizers ?5. Opioids ?6. Physical therapy and/or chiropractic manipulation ?7. Modalities (Heat, ice, etc.) ?8. Invasive techniques such as nerve blocks. ?[name] has attained more than 50% relief of the pain from a series of diagnostic injections conducted in separate occasions. ? ?Pain Score: ?Pre-procedure: 10-Worst pain ever/10 ?Post-procedure: 1 /10 ? ?   ?Effectiveness:   ?Initial hour after procedure: 100 %  ?Subsequent 4-6 hours post-procedure: 100 %  ?Analgesia past initial 6 hours: 75% ?Ongoing improvement:  ?Analgesic:  <10% ?Function: No benefit ?ROM: No benefit ? ? ?Pharmacotherapy Assessment  ? ?Opioid Analgesic: Norco 7.5 mg TID PRN #90/month, 22.5   ? ?Monitoring: ? PMP: PDMP not reviewed this encounter.       ?Pharmacotherapy: No side-effects or adverse reactions reported. ?Compliance: No problems identified. ?Effectiveness: Clinically acceptable. ?Plan: Refer to "POC". UDS:  ?Summary  ?Date Value Ref Range Status  ?06/14/2020 Note  Final  ?  Comment:  ?  ==================================================================== ?[employer] Select 13 (MW) ?==================================================================== ?Test                             Result       Flag       Units ? ?Drug Present and Declared for Prescription Verification ?  Hydrocodone                    1019         EXPECTED   ng/mg creat ?  Hydromorphone                  166          EXPECTED   ng/mg creat ?  Dihydrocodeine                 71           EXPECTED   ng/mg creat ?  Norhydrocodone                 536          EXPECTED   ng/mg creat ?   Sources of hydrocodone include scheduled prescription medications. ?   Hydromorphone, dihydrocodeine and norhydrocodone are expected ?   metabolites of hydrocodone. Hydromorphone and dihydrocodeine are ?   also available as scheduled prescription medications. ? ?==================================================================== ?Test  Result    Flag   Units      Ref Range ?  Creatinine              166              mg/dL      >=20 ?==================================================================== ?Declared Medications: ? The flagging and interpretation on this report are based on the ? following declared medications.  Unexpected results may arise from ? inaccuracies in the declared medications. ? ? **Note: The testing scope of this  panel includes these medications: ? ? Hydrocodone (Norco) ? ? **Note: The testing scope of this panel does not include the ? following reported medications: ? ? Acetaminophen (Norco) ? Albuterol (Ventolin HFA) ? Allopurinol (Zylo

## 2021-07-02 NOTE — Telephone Encounter (Signed)
I called and spoke with the patient. ?I advised her that [employer] has a monitor for sensitive skin that we can use in place of the ZIO monitor. ?Per the patient, [employer] sent her another monitor that she received yesterday. ?She advised that her skin is still burning from the previous monitor. ?I have asked her to call [employer] and let them know she will not be wearing this and send it back to the company. ? ?The patient advised she will send the ZIO monitor back, but she is not wanting to wear another external monitor that goes on her skin as she feels they are too complicated for her and bothersome.  ? ?I have advised the patient that Dr. Caryl Comes has also mentioned finding some type of wearable technology such as a watch. ?She may be agreeable to this.  ?She does not have a cell phone that would support an app such as an Financial controller.  ? ?The patient is aware I will review again with Dr. Caryl Comes when he is back in the office in 2 weeks and call her with a further plan of care.  ? ?The patient voices understanding and is agreeable.  ?

## 2021-07-03 ENCOUNTER — Inpatient Hospital Stay: Payer: Medicare Other

## 2021-07-03 ENCOUNTER — Encounter: Payer: Self-pay | Admitting: Oncology

## 2021-07-03 ENCOUNTER — Inpatient Hospital Stay (HOSPITAL_BASED_OUTPATIENT_CLINIC_OR_DEPARTMENT_OTHER): Payer: Medicare Other | Admitting: Oncology

## 2021-07-03 VITALS — BP 126/75 | HR 85 | Temp 96.8°F | Wt 266.0 lb

## 2021-07-03 DIAGNOSIS — D631 Anemia in chronic kidney disease: Secondary | ICD-10-CM | POA: Diagnosis not present

## 2021-07-03 DIAGNOSIS — N183 Chronic kidney disease, stage 3 unspecified: Secondary | ICD-10-CM | POA: Diagnosis not present

## 2021-07-03 DIAGNOSIS — Z79899 Other long term (current) drug therapy: Secondary | ICD-10-CM | POA: Diagnosis not present

## 2021-07-03 DIAGNOSIS — Z79631 Long term (current) use of antimetabolite agent: Secondary | ICD-10-CM

## 2021-07-03 DIAGNOSIS — M3500 Sicca syndrome, unspecified: Secondary | ICD-10-CM | POA: Diagnosis not present

## 2021-07-03 DIAGNOSIS — E039 Hypothyroidism, unspecified: Secondary | ICD-10-CM | POA: Diagnosis not present

## 2021-07-03 DIAGNOSIS — D649 Anemia, unspecified: Secondary | ICD-10-CM

## 2021-07-03 DIAGNOSIS — K449 Diaphragmatic hernia without obstruction or gangrene: Secondary | ICD-10-CM | POA: Diagnosis not present

## 2021-07-03 DIAGNOSIS — N1832 Chronic kidney disease, stage 3b: Secondary | ICD-10-CM | POA: Diagnosis not present

## 2021-07-03 DIAGNOSIS — E785 Hyperlipidemia, unspecified: Secondary | ICD-10-CM | POA: Diagnosis not present

## 2021-07-03 DIAGNOSIS — Z86010 Personal history of colonic polyps: Secondary | ICD-10-CM | POA: Diagnosis not present

## 2021-07-03 DIAGNOSIS — Z87891 Personal history of nicotine dependence: Secondary | ICD-10-CM | POA: Diagnosis not present

## 2021-07-03 DIAGNOSIS — Z8051 Family history of malignant neoplasm of kidney: Secondary | ICD-10-CM | POA: Diagnosis not present

## 2021-07-03 DIAGNOSIS — M069 Rheumatoid arthritis, unspecified: Secondary | ICD-10-CM | POA: Diagnosis not present

## 2021-07-03 DIAGNOSIS — K219 Gastro-esophageal reflux disease without esophagitis: Secondary | ICD-10-CM | POA: Diagnosis not present

## 2021-07-03 DIAGNOSIS — I129 Hypertensive chronic kidney disease with stage 1 through stage 4 chronic kidney disease, or unspecified chronic kidney disease: Secondary | ICD-10-CM | POA: Diagnosis not present

## 2021-07-03 DIAGNOSIS — Z87442 Personal history of urinary calculi: Secondary | ICD-10-CM | POA: Diagnosis not present

## 2021-07-03 MED ORDER — SODIUM CHLORIDE 0.9 % IV SOLN: 200.0000 mg | Freq: Once | INTRAVENOUS | Status: DC

## 2021-07-03 MED ORDER — IRON SUCROSE 20 MG/ML IV SOLN
200.0000 mg | Freq: Once | INTRAVENOUS | Status: AC
  Administered 2021-07-03: 200 mg via INTRAVENOUS

## 2021-07-03 MED ORDER — SODIUM CHLORIDE 0.9% FLUSH
10.0000 mL | Freq: Once | INTRAVENOUS | Status: DC | PRN
  Filled 2021-07-03: qty 10

## 2021-07-03 MED ORDER — SODIUM CHLORIDE 0.9 % IV SOLN
Freq: Once | INTRAVENOUS | Status: AC
  Filled 2021-07-03: qty 250

## 2021-07-03 NOTE — Progress Notes (Signed)
Patient tolerated Venofer infusion well, no questions/concerns voiced. Patient stable at discharge. AVS given.   ?

## 2021-07-03 NOTE — Patient Instructions (Signed)

## 2021-07-03 NOTE — Progress Notes (Signed)
?Hematology/Oncology Progress note ?[phone] [phone] ?  ? ?  ? ? ? ?Clinic Day:  07/03/2021 ? ?Referring physician: Juline Patch, MD ? ?Chief Complaint: Michele Moore is a 71 y.o. female presents for anemia in stage IIIb chronic kidney disease  ? ?PERTINENT ONCOLOGY HISTORY ?Michele Moore is a 71 y.o.afemale who has above oncology history reviewed by me today presented for follow up visit for management of anemia in CKD ? ?PERTINENT HEMATOLOGY HISTORY ?Patient previously followed up by Dr.Corcoran, patient switched care to me on 08/30/20 ?Extensive medical record review was performed by me ? ?# anemia in stage IIIb chronic kidney disease.  ?Work-up on 04/23/2020 revealed a hematocrit of 35.0, hemoglobin 11.4, MCV 92.3, platelets 168,000, WBC 4,600 with an ANC of 3000. Ferritin was 107 with an iron saturation of 37% and a TIBC of 315. Sed rate was 54 and CRP was 0.9.  Vitamin B12 was 429 and folate >100.0.  ? ?10/22/2017 Colonoscopy for heme positive stool revealed one 7 mm polyp in the ascending colon, one 3 mm polyp in the transverse colon, and three 1 to 4 mm polyps in the sigmoid colon. There were non-bleeding internal hemorrhoids. There were petechia(e) in the rectum. Pathology showed two tubular adenomas and one hyperplastic polyp. EGD on 10/22/2017 showed gastritis and one gastric polyp. ? ?She has Sjogren's syndrome, rheumatoid arthritis, hypertension, and proteinuria.  She takes oral iron once a day. ? ?INTERVAL HISTORY ?Michele Moore is a 71 y.o. female who has above history reviewed by me today presents for follow up visit for anemia secondary to chronic kidney disease. ? ?Patient follows up with pain clinic for pain management.  She previously tolerated IV Venofer treatments.  Denies any blood in the stool.  She takes oral iron supplementation and the stool is dark.  Denies any abdominal pain, unintentional weight loss, night sweats, fever. ? ?Past Medical History:  ?Diagnosis  Date  ? Allergy   ? Anemia   ? Arthritis   ? Asthma   ? Cataract   ? Chronic kidney disease   ? STAGE 3 PER DR EASON 08/02/15  ? Collagen vascular disease ([place])   ? Complication of anesthesia   ? WOKE UP DURING COLONOSCOPY  ? Dyspnea   ? Dysrhythmia   ? IRREGULAR HEART BEAT  ? Family history of adverse reaction to anesthesia   ? sister difficult to put to sleep  ? GERD (gastroesophageal reflux disease)   ? Glaucoma   ? H/O tooth extraction   ? all lower teeth 1/19  ? Hemorrhoid   ? History of hiatal hernia   ? History of kidney stones   ? H/O  ? Hypertension   ? Hypothyroidism   ? Migraines   ? Mixed hyperlipidemia   ? Multiple gastric ulcers   ? Thyroid disease   ? Wears dentures   ? partial upper and lower  ? ? ?Past Surgical History:  ?Procedure Laterality Date  ? ANKLE SURGERY    ? CARPAL TUNNEL RELEASE    ? x3  ? COLONOSCOPY  2000?  ? COLONOSCOPY WITH PROPOFOL N/A 10/22/2017  ? Procedure: COLONOSCOPY WITH PROPOFOL;  Surgeon: Lucilla Lame, MD;  Location: [facility];  Service: Endoscopy;  Laterality: N/A;  ? ESOPHAGOGASTRODUODENOSCOPY (EGD) WITH PROPOFOL N/A 10/22/2017  ? Procedure: ESOPHAGOGASTRODUODENOSCOPY (EGD) WITH PROPOFOL;  Surgeon: Lucilla Lame, MD;  Location: [facility];  Service: Endoscopy;  Laterality: N/A;  ? FUSION OF TALONAVICULAR JOINT Right 08/03/2015  ?  Procedure: TAILOR NAVICULAR JOINT FUSION - RIGHT ;  Surgeon: [name], DPM;  Location: [facility];  Service: Podiatry;  Laterality: Right;  ? JOINT REPLACEMENT Right   ? knee x 3 ,right x1 and left x2  ? POLYPECTOMY N/A 10/22/2017  ? Procedure: POLYPECTOMY;  Surgeon: Lucilla Lame, MD;  Location: [facility];  Service: Endoscopy;  Laterality: N/A;  ? ROTATOR CUFF REPAIR    ? TONSILLECTOMY    ? TOTAL KNEE REVISION Right 01/20/2018  ? Procedure: POLYETHYLENE EXCHANGE;  Surgeon: Dereck Leep, MD;  Location: [facility];  Service: Orthopedics;  Laterality: Right;  ? TUBAL LIGATION    ? UPPER GI ENDOSCOPY  2000?  ? ? ?Family  History  ?Problem Relation Age of Onset  ? Heart failure Mother   ? Gout Mother   ? Arthritis Mother   ? Hypertension Mother   ? Stroke Mother   ? Heart failure Father   ? Diabetes Father   ? Hyperlipidemia Father   ? COPD Sister   ? Cancer Maternal Aunt   ?     breast  ? Cancer Maternal Uncle   ?     kidney  ? Breast cancer Neg Hx   ? ? ?Social History:  reports that she quit smoking about 28 years ago. Her smoking use included cigarettes. She has a 40.00 pack-year smoking history. She has never used smokeless tobacco. She reports that she does not drink alcohol and does not use drugs. She denies current alcohol or tobacco use. She quit smoking remotely. She denies exposure to radiation or toxins. She retired at age 89. 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 The patient is alone today. ? ?Allergies:  ?Allergies  ?Allergen Reactions  ? Ampicillin Swelling  ? Penicillins Anaphylaxis and Swelling  ?  IgE = 10 (11/05/2017) ? ?Has patient had a PCN reaction causing immediate rash, facial/tongue/throat swelling, SOB or lightheadedness with hypotension: Yes ?Has patient had a PCN reaction causing severe rash involving mucus membranes or skin necrosis: No ?Has patient had a PCN reaction that required hospitalization: No ?Has patient had a PCN reaction occurring within the last 10 years: Yes ?If all of the above answers are "NO", then may proceed with Cephalosporin use.  ? Vancomycin   ?  Other reaction(s): Other (see comments)  ? Vibramycin [Doxycycline Calcium] Rash  ? ? ?Current Medications: ?Current Outpatient Medications  ?Medication Sig Dispense Refill  ? albuterol (VENTOLIN HFA) 108 (90 Base) MCG/ACT inhaler INHALE 2 PUFFS INTO THE LUNGS EVERY 4 HOURS AS NEEDED FOR WHEEZING 18 g 0  ? albuterol (VENTOLIN HFA) 108 (90 Base) MCG/ACT inhaler Inhale 2 puffs into the lungs every 6 (six) hours as needed for wheezing or shortness of breath. 8 g 2  ? allopurinol (ZYLOPRIM) 100 MG tablet Take 200 mg by mouth every morning.      ? aspirin EC 81 MG tablet Take 81 mg by mouth daily. Swallow whole.    ? betamethasone dipropionate 0.05 % cream APPLY TO PSORIASIS ON HANDS TWICE DAILY ONLY. AVOID FACE, GROIN AND AXILLA 45 g 0  ? betamethasone valerate (VALISONE) 0.1 % cream Apply topically 2 (two) times daily as needed (Rash). 45 g 3  ? calcipotriene (DOVONOX) 0.005 % cream APPLY TOPICALLY TO PSORIASIS AREAS ON LEGS AND FEET ONCE OR TWICE DAILY 540 g 1  ? Cholecalciferol (VITAMIN D3) 2000 units TABS Take 2,000 Units by mouth daily.    ? clobetasol ointment (TEMOVATE) 0.05 % Apply topically  2 (two) times daily.    ? diclofenac sodium (VOLTAREN) 1 % GEL Apply 2 g topically 3 (three) times daily.     ? ferrous sulfate 325 (65 FE) MG tablet Take 325 mg by mouth daily with breakfast.     ? gabapentin (NEURONTIN) 600 MG tablet Take 1 tablet (600 mg total) by mouth at bedtime. 30 tablet 5  ? hydrALAZINE (APRESOLINE) 50 MG tablet Take 1 tablet by mouth twice daily 180 tablet 1  ? HYDROcodone-acetaminophen (NORCO) 7.5-325 MG tablet Take 1 tablet by mouth every 8 (eight) hours as needed for severe pain. Must last 30 days. 90 tablet 0  ? [START ON 07/24/2021] HYDROcodone-acetaminophen (NORCO) 7.5-325 MG tablet Take 1 tablet by mouth every 8 (eight) hours as needed for severe pain. Must last 30 days. 90 tablet 0  ? ketoconazole (NIZORAL) 2 % cream Apply topically daily as needed. 15 g 2  ? levothyroxine (SYNTHROID) 175 MCG tablet Take 1 tablet (175 mcg total) by mouth daily. 90 tablet 0  ? losartan (COZAAR) 25 MG tablet Take 1 tablet (25 mg total) by mouth every morning. 90 tablet 1  ? meclizine (ANTIVERT) 12.5 MG tablet TAKE 1 TABLET(12.5 MG) BY MOUTH THREE TIMES DAILY AS NEEDED FOR DIZZINESS 30 tablet 0  ? methotrexate (RHEUMATREX) 2.5 MG tablet Take 7.5 mg by mouth every Friday. Take 5 tablets by mouth once weekly    ? montelukast (SINGULAIR) 10 MG tablet TAKE 1 TABLET(10 MG) BY MOUTH AT BEDTIME 90 tablet 1  ? Multiple Vitamins-Calcium (ONE-A-DAY WOMENS  FORMULA PO) Take 1 tablet by mouth daily.    ? mupirocin ointment (BACTROBAN) 2 % Apply 1 application topically 2 (two) times daily. 22 g 0  ? omega-3 acid ethyl esters (LOVAZA) 1 g capsule Take 1 capsule (1 g

## 2021-07-10 ENCOUNTER — Inpatient Hospital Stay: Payer: Medicare Other

## 2021-07-10 VITALS — BP 116/48 | HR 68 | Temp 97.7°F | Resp 18

## 2021-07-10 DIAGNOSIS — M3500 Sicca syndrome, unspecified: Secondary | ICD-10-CM | POA: Diagnosis not present

## 2021-07-10 DIAGNOSIS — N183 Chronic kidney disease, stage 3 unspecified: Secondary | ICD-10-CM

## 2021-07-10 DIAGNOSIS — M069 Rheumatoid arthritis, unspecified: Secondary | ICD-10-CM | POA: Diagnosis not present

## 2021-07-10 DIAGNOSIS — N1832 Chronic kidney disease, stage 3b: Secondary | ICD-10-CM | POA: Diagnosis not present

## 2021-07-10 DIAGNOSIS — I129 Hypertensive chronic kidney disease with stage 1 through stage 4 chronic kidney disease, or unspecified chronic kidney disease: Secondary | ICD-10-CM | POA: Diagnosis not present

## 2021-07-10 DIAGNOSIS — Z79899 Other long term (current) drug therapy: Secondary | ICD-10-CM | POA: Diagnosis not present

## 2021-07-10 DIAGNOSIS — D631 Anemia in chronic kidney disease: Secondary | ICD-10-CM | POA: Diagnosis not present

## 2021-07-10 DIAGNOSIS — D649 Anemia, unspecified: Secondary | ICD-10-CM

## 2021-07-10 MED ORDER — SODIUM CHLORIDE 0.9 % IV SOLN
INTRAVENOUS | Status: DC
  Filled 2021-07-10: qty 250

## 2021-07-10 MED ORDER — IRON SUCROSE 20 MG/ML IV SOLN
200.0000 mg | Freq: Once | INTRAVENOUS | Status: AC
  Administered 2021-07-10: 200 mg via INTRAVENOUS
  Filled 2021-07-10: qty 10

## 2021-07-10 MED ORDER — SODIUM CHLORIDE 0.9 % IV SOLN: 200.0000 mg | Freq: Once | INTRAVENOUS | Status: DC

## 2021-07-10 NOTE — Patient Instructions (Signed)
[facility]  Discharge Instructions: ?Thank you for choosing [facility] to provide your oncology and hematology care.  ?If you have a lab appointment with the Cancer Center, please go directly to the Cancer Center and check in at the registration area. ? ?Wear comfortable clothing and clothing appropriate for easy access to any Portacath or PICC line.  ? ?We strive to give you quality time with your provider. You may need to reschedule your appointment if you arrive late (15 or more minutes).  Arriving late affects you and other patients whose appointments are after yours.  Also, if you miss three or more appointments without notifying the office, you may be dismissed from the clinic at the provider?s discretion.    ?  ?For prescription refill requests, have your pharmacy contact our office and allow 72 hours for refills to be completed.   ? ?Today you received the following chemotherapy and/or immunotherapy agents VENOFER ?    ?  ?To help prevent nausea and vomiting after your treatment, we encourage you to take your nausea medication as directed. ? ?BELOW ARE SYMPTOMS THAT SHOULD BE REPORTED IMMEDIATELY: ?*FEVER GREATER THAN 100.4 F (38 ?C) OR HIGHER ?*CHILLS OR SWEATING ?*NAUSEA AND VOMITING THAT IS NOT CONTROLLED WITH YOUR NAUSEA MEDICATION ?*UNUSUAL SHORTNESS OF BREATH ?*UNUSUAL BRUISING OR BLEEDING ?*URINARY PROBLEMS (pain or burning when urinating, or frequent urination) ?*BOWEL PROBLEMS (unusual diarrhea, constipation, pain near the anus) ?TENDERNESS IN MOUTH AND THROAT WITH OR WITHOUT PRESENCE OF ULCERS (sore throat, sores in mouth, or a toothache) ?UNUSUAL RASH, SWELLING OR PAIN  ?UNUSUAL VAGINAL DISCHARGE OR ITCHING  ? ?Items with * indicate a potential emergency and should be followed up as soon as possible or go to the Emergency Department if any problems should occur. ? ?Please show the CHEMOTHERAPY ALERT CARD or IMMUNOTHERAPY ALERT CARD at check-in to  the Emergency Department and triage nurse. ? ?Should you have questions after your visit or need to cancel or reschedule your appointment, please contact [facility]  [phone] and follow the prompts.  Office hours are 8:00 a.m. to 4:30 p.m. Monday - Friday. Please note that voicemails left after 4:00 p.m. may not be returned until the following business day.  We are closed weekends and major holidays. You have access to a nurse at all times for urgent questions. Please call the main number to the clinic [phone] and follow the prompts. ? ?For any non-urgent questions, you may also contact your provider using MyChart. We now offer e-Visits for anyone 18 and older to request care online for non-urgent symptoms. For details visit [email or web address]. ?  ?Also download the MyChart app! Go to the app store, search "MyChart", open the app, select [facility], and log in with your MyChart username and password. ? ?Due to Covid, a mask is required upon entering the hospital/clinic. If you do not have a mask, one will be given to you upon arrival. For doctor visits, patients may have 1 support person aged 18 or older with them. For treatment visits, patients cannot have anyone with them due to current Covid guidelines and our immunocompromised population.  ? ?Iron Sucrose Injection ?What is this medication? ?IRON SUCROSE (EYE ern SOO krose) treats low levels of iron (iron deficiency anemia) in people with kidney disease. Iron is a mineral that plays an important role in making red blood cells, which carry oxygen from your lungs to the rest of your body. ?This medicine   may be used for other purposes; ask your health care provider or pharmacist if you have questions. ?COMMON BRAND NAME(S): Venofer ?What should I tell my care team before I take this medication? ?They need to know if you have any of these conditions: ?Anemia not caused by low iron levels ?Heart disease ?High levels of  iron in the blood ?Kidney disease ?Liver disease ?An unusual or allergic reaction to iron, other medications, foods, dyes, or preservatives ?Pregnant or trying to get pregnant ?Breast-feeding ?How should I use this medication? ?This medication is for infusion into a vein. It is given in a hospital or clinic setting. ?Talk to your care team about the use of this medication in children. While this medication may be prescribed for children as young as 2 years for selected conditions, precautions do apply. ?Overdosage: If you think you have taken too much of this medicine contact a poison control center or emergency room at once. ?NOTE: This medicine is only for you. Do not share this medicine with others. ?What if I miss a dose? ?It is important not to miss your dose. Call your care team if you are unable to keep an appointment. ?What may interact with this medication? ?Do not take this medication with any of the following: ?Deferoxamine ?Dimercaprol ?Other iron products ?This medication may also interact with the following: ?Chloramphenicol ?Deferasirox ?This list may not describe all possible interactions. Give your health care provider a list of all the medicines, herbs, non-prescription drugs, or dietary supplements you use. Also tell them if you smoke, drink alcohol, or use illegal drugs. Some items may interact with your medicine. ?What should I watch for while using this medication? ?Visit your care team regularly. Tell your care team if your symptoms do not start to get better or if they get worse. You may need blood work done while you are taking this medication. ?You may need to follow a special diet. Talk to your care team. Foods that contain iron include: whole grains/cereals, dried fruits, beans, or peas, leafy green vegetables, and organ meats (liver, kidney). ?What side effects may I notice from receiving this medication? ?Side effects that you should report to your care team as soon as  possible: ?Allergic reactions--skin rash, itching, hives, swelling of the face, lips, tongue, or throat ?Low blood pressure--dizziness, feeling faint or lightheaded, blurry vision ?Shortness of breath ?Side effects that usually do not require medical attention (report to your care team if they continue or are bothersome): ?Flushing ?Headache ?Joint pain ?Muscle pain ?Nausea ?Pain, redness, or irritation at injection site ?This list may not describe all possible side effects. Call your doctor for medical advice about side effects. You may report side effects to FDA at 1-800-FDA-1088. ?Where should I keep my medication? ?This medication is given in a hospital or clinic and will not be stored at home. ?NOTE: This sheet is a summary. It may not cover all possible information. If you have questions about this medicine, talk to your doctor, pharmacist, or health care provider. ?? 2023 Elsevier/Gold Standard (2020-07-06 00:00:00) ? ?

## 2021-07-17 ENCOUNTER — Inpatient Hospital Stay: Payer: Medicare Other

## 2021-07-17 VITALS — BP 125/67 | HR 72 | Temp 96.5°F | Resp 18

## 2021-07-17 DIAGNOSIS — D631 Anemia in chronic kidney disease: Secondary | ICD-10-CM | POA: Diagnosis not present

## 2021-07-17 DIAGNOSIS — M069 Rheumatoid arthritis, unspecified: Secondary | ICD-10-CM | POA: Diagnosis not present

## 2021-07-17 DIAGNOSIS — N1832 Chronic kidney disease, stage 3b: Secondary | ICD-10-CM | POA: Diagnosis not present

## 2021-07-17 DIAGNOSIS — Z79899 Other long term (current) drug therapy: Secondary | ICD-10-CM | POA: Diagnosis not present

## 2021-07-17 DIAGNOSIS — D649 Anemia, unspecified: Secondary | ICD-10-CM

## 2021-07-17 DIAGNOSIS — N183 Chronic kidney disease, stage 3 unspecified: Secondary | ICD-10-CM

## 2021-07-17 DIAGNOSIS — M3500 Sicca syndrome, unspecified: Secondary | ICD-10-CM | POA: Diagnosis not present

## 2021-07-17 DIAGNOSIS — I129 Hypertensive chronic kidney disease with stage 1 through stage 4 chronic kidney disease, or unspecified chronic kidney disease: Secondary | ICD-10-CM | POA: Diagnosis not present

## 2021-07-17 MED ORDER — SODIUM CHLORIDE 0.9 % IV SOLN
Freq: Once | INTRAVENOUS | Status: AC
  Filled 2021-07-17: qty 250

## 2021-07-17 MED ORDER — IRON SUCROSE 20 MG/ML IV SOLN
200.0000 mg | Freq: Once | INTRAVENOUS | Status: AC
  Administered 2021-07-17: 200 mg via INTRAVENOUS
  Filled 2021-07-17: qty 10

## 2021-07-17 MED ORDER — SODIUM CHLORIDE 0.9 % IV SOLN: 200.0000 mg | Freq: Once | INTRAVENOUS | Status: DC

## 2021-07-18 ENCOUNTER — Telehealth: Payer: Self-pay

## 2021-07-18 NOTE — Telephone Encounter (Signed)
Pt returning call regarding appt. Please advise 

## 2021-07-18 NOTE — Telephone Encounter (Signed)
LVM TO CALL BACK TO UPDATE CONSENT FOR TELEPHONE VISIT ON 5/30

## 2021-07-18 NOTE — Telephone Encounter (Signed)
-----   Message from [name], RN sent at 07/18/2021  3:00 PM EDT ----- Patient is scheduled for a telephone visit on 5/30 with SK. I forgot to update her consent.  Do you mind calling her to update this?  Thank you!

## 2021-07-18 NOTE — Telephone Encounter (Signed)
Dr. Caryl Comes reviewed the message below. He advised he would like to do a phone visit with the patient to discuss a possible implantation of an ILR/ what next steps might be.  I called the patient and advised her that Dr. Caryl Comes would like to have a phone visit with her next week if possible to discuss next steps in light of the fact that she cannot wear an external heart monitor/ doesn't want to try another brand.  The patient is agreeable with a tele phone visit on 07/23/21 at 8:40 am. She does not have a way to check her BP/ HR/ weight at home.  She is aware she will be contacted by a medical assistance just prior to her visit and then Dr. Caryl Comes will call her.  She voices understanding of the above, was agreeable, and appreciative of the call back.

## 2021-07-19 DIAGNOSIS — Z96653 Presence of artificial knee joint, bilateral: Secondary | ICD-10-CM | POA: Diagnosis not present

## 2021-07-23 ENCOUNTER — Encounter: Payer: Self-pay | Admitting: Internal Medicine

## 2021-07-23 ENCOUNTER — Ambulatory Visit (INDEPENDENT_AMBULATORY_CARE_PROVIDER_SITE_OTHER): Payer: Medicare Other | Admitting: Internal Medicine

## 2021-07-23 ENCOUNTER — Telehealth: Payer: Self-pay | Admitting: Internal Medicine

## 2021-07-23 VITALS — Ht 71.0 in | Wt 259.0 lb

## 2021-07-23 DIAGNOSIS — R002 Palpitations: Secondary | ICD-10-CM

## 2021-07-23 DIAGNOSIS — I5032 Chronic diastolic (congestive) heart failure: Secondary | ICD-10-CM | POA: Diagnosis not present

## 2021-07-23 DIAGNOSIS — I10 Essential (primary) hypertension: Secondary | ICD-10-CM | POA: Diagnosis not present

## 2021-07-23 NOTE — Progress Notes (Signed)
Electrophysiology TeleHealth Note   Due to national recommendations of social distancing due to COVID 19, an audio/video telehealth visit is felt to be most appropriate for this patient at this time.  See MyChart message from today for the patient's consent to telehealth for [facility].   Date:  07/23/2021   ID:  Michele Moore, DOB [birth year], MRN [id number]  Location: patient's home  Provider location: [address] [place]  Evaluation Performed: Follow-up visit  PCP:  Juline Patch, MD  Cardiologist:   *** Electrophysiologist:  SK   Chief Complaint:  ***  History of Present Illness:    Michele Moore is a 71 y.o. female who presents via audio/video conferencing for a telehealth visit today.  Since last being seen in our clinic for palpitations concerning for Afib  the patient reports being unable to wear monitor because of skin sensitivity  Takes ASA -- & MTX rheumatoid arthritis   DATE TEST EF    8/20 Myoview   55-60 % Normal perfusion   8/20 Echo   55-60 %               Date Cr K Hgb  3/23 1.67 3.7  9.7             Thromboembolic risk factors ( age -46, HTN-1 , CHF-1, Gender-1) for a CHADSVASc Score of >=4   Past Medical History:  Diagnosis Date   Allergy    Anemia    Arthritis    Asthma    Cataract    Chronic kidney disease    STAGE 3 PER DR EASON 08/02/15   Collagen vascular disease ([place])    Complication of anesthesia    WOKE UP DURING COLONOSCOPY   Dyspnea    Dysrhythmia    IRREGULAR HEART BEAT   Family history of adverse reaction to anesthesia    sister difficult to put to sleep   GERD (gastroesophageal reflux disease)    Glaucoma    H/O tooth extraction    all lower teeth 1/19   Hemorrhoid    History of hiatal hernia    History of kidney stones    H/O   Hypertension    Hypothyroidism    Migraines    Mixed hyperlipidemia    Multiple gastric ulcers    Thyroid disease    Wears dentures    partial upper and lower    Past  Surgical History:  Procedure Laterality Date   ANKLE SURGERY     CARPAL TUNNEL RELEASE     x3   COLONOSCOPY  2000?   COLONOSCOPY WITH PROPOFOL N/A 10/22/2017   Procedure: COLONOSCOPY WITH PROPOFOL;  Surgeon: Lucilla Lame, MD;  Location: [facility];  Service: Endoscopy;  Laterality: N/A;   ESOPHAGOGASTRODUODENOSCOPY (EGD) WITH PROPOFOL N/A 10/22/2017   Procedure: ESOPHAGOGASTRODUODENOSCOPY (EGD) WITH PROPOFOL;  Surgeon: Lucilla Lame, MD;  Location: [facility];  Service: Endoscopy;  Laterality: N/A;   FUSION OF TALONAVICULAR JOINT Right 08/03/2015   Procedure: TAILOR NAVICULAR JOINT FUSION - RIGHT ;  Surgeon: [name], DPM;  Location: [facility];  Service: Podiatry;  Laterality: Right;   JOINT REPLACEMENT Right    knee x 3 ,right x1 and left x2   POLYPECTOMY N/A 10/22/2017   Procedure: POLYPECTOMY;  Surgeon: Lucilla Lame, MD;  Location: [facility];  Service: Endoscopy;  Laterality: N/A;   ROTATOR CUFF REPAIR     TONSILLECTOMY     TOTAL KNEE REVISION Right  01/20/2018   Procedure: POLYETHYLENE EXCHANGE;  Surgeon: Dereck Leep, MD;  Location: [facility];  Service: Orthopedics;  Laterality: Right;   TUBAL LIGATION     UPPER GI ENDOSCOPY  2000?    Current Outpatient Medications  Medication Sig Dispense Refill   albuterol (VENTOLIN HFA) 108 (90 Base) MCG/ACT inhaler INHALE 2 PUFFS INTO THE LUNGS EVERY 4 HOURS AS NEEDED FOR WHEEZING 18 g 0   albuterol (VENTOLIN HFA) 108 (90 Base) MCG/ACT inhaler Inhale 2 puffs into the lungs every 6 (six) hours as needed for wheezing or shortness of breath. 8 g 2   allopurinol (ZYLOPRIM) 100 MG tablet Take 200 mg by mouth every morning.      aspirin EC 81 MG tablet Take 81 mg by mouth daily. Swallow whole.     betamethasone dipropionate 0.05 % cream APPLY TO PSORIASIS ON HANDS TWICE DAILY ONLY. AVOID FACE, GROIN AND AXILLA 45 g 0   betamethasone valerate (VALISONE) 0.1 % cream Apply topically 2 (two) times daily as needed (Rash). 45 g  3   calcipotriene (DOVONOX) 0.005 % cream APPLY TOPICALLY TO PSORIASIS AREAS ON LEGS AND FEET ONCE OR TWICE DAILY 540 g 1   Cholecalciferol (VITAMIN D3) 2000 units TABS Take 2,000 Units by mouth daily.     clobetasol ointment (TEMOVATE) 0.05 % Apply topically 2 (two) times daily.     diclofenac sodium (VOLTAREN) 1 % GEL Apply 2 g topically 3 (three) times daily.      ferrous sulfate 325 (65 FE) MG tablet Take 325 mg by mouth daily with breakfast.      gabapentin (NEURONTIN) 600 MG tablet Take 1 tablet (600 mg total) by mouth at bedtime. 30 tablet 5   hydrALAZINE (APRESOLINE) 50 MG tablet Take 1 tablet by mouth twice daily 180 tablet 1   HYDROcodone-acetaminophen (NORCO) 7.5-325 MG tablet Take 1 tablet by mouth every 8 (eight) hours as needed for severe pain. Must last 30 days. 90 tablet 0   HYDROcodone-acetaminophen (NORCO) 7.5-325 MG tablet Take 1 tablet by mouth every 8 (eight) hours as needed for severe pain. Must last 30 days. 90 tablet 0   [START ON 07/24/2021] HYDROcodone-acetaminophen (NORCO) 7.5-325 MG tablet Take 1 tablet by mouth every 8 (eight) hours as needed for severe pain. Must last 30 days. 90 tablet 0   ketoconazole (NIZORAL) 2 % cream Apply topically daily as needed. 15 g 2   levothyroxine (SYNTHROID) 175 MCG tablet Take 1 tablet (175 mcg total) by mouth daily. 90 tablet 0   losartan (COZAAR) 25 MG tablet Take 1 tablet (25 mg total) by mouth every morning. 90 tablet 1   meclizine (ANTIVERT) 12.5 MG tablet TAKE 1 TABLET(12.5 MG) BY MOUTH THREE TIMES DAILY AS NEEDED FOR DIZZINESS 30 tablet 0   methotrexate (RHEUMATREX) 2.5 MG tablet Take 7.5 mg by mouth every Friday. Take 5 tablets by mouth once weekly     montelukast (SINGULAIR) 10 MG tablet TAKE 1 TABLET(10 MG) BY MOUTH AT BEDTIME 90 tablet 1   Multiple Vitamins-Calcium (ONE-A-DAY WOMENS FORMULA PO) Take 1 tablet by mouth daily.     mupirocin ointment (BACTROBAN) 2 % Apply 1 application topically 2 (two) times daily. 22 g 0    omega-3 acid ethyl esters (LOVAZA) 1 g capsule Take 1 capsule (1 g total) by mouth 2 (two) times daily. 180 capsule 1   omeprazole (PRILOSEC) 40 MG capsule TAKE 1 CAPSULE(40 MG) BY MOUTH DAILY 90 capsule 1   OTEZLA 30 MG TABS Take  1 tablet by mouth daily as needed.     Polyethyl Glycol-Propyl Glycol (SYSTANE OP) Place 1 drop into both eyes daily as needed (dry eyes).      potassium chloride SA (KLOR-CON M) 20 MEQ tablet Take 0.5 tablets (10 mEq total) by mouth daily. 45 tablet 1   tiZANidine (ZANAFLEX) 4 MG tablet Take 1 tablet (4 mg total) by mouth at bedtime. 60 tablet 5   triamcinolone (KENALOG) 0.1 % Apply 1 application topically 2 (two) times daily. 30 g 0   triamterene-hydrochlorothiazide (MAXZIDE) 75-50 MG tablet Take 1 tablet by mouth daily. 90 tablet 1   No current facility-administered medications for this visit.    Allergies:   Ampicillin, Penicillins, Vancomycin, and Vibramycin [doxycycline calcium]      ROS:  Please see the history of present illness.   All other systems are personally reviewed and negative.    Exam:    Vital Signs:  Ht '5\' 11"'$  (1.803 m)   Wt 259 lb (117.5 kg)   BMI 36.12 kg/m  ***      Labs/Other Tests and Data Reviewed:    Recent Labs: 06/04/2021: BUN 25; Creatinine, Ser 1.67; Potassium 3.7; Sodium 137 07/01/2021: Hemoglobin 9.6; Platelets 192   Wt Readings from Last 3 Encounters:  07/23/21 259 lb (117.5 kg)  07/03/21 266 lb (120.7 kg)  06/13/21 269 lb 12.8 oz (122.4 kg)          ASSESSMENT & PLAN:    Atrial fibrillation by report   Palpitations   HTN  Anemia - chronic recently worse on iron therapy most recent labs are not consistent with ongoing iron deficiency   HFpEF   Renal insufficiency Estimated Creatinine Clearance: 43.9 mL/min (A) (by C-G formula based on SCr of 1.67 mg/dL (H)).  Grade 3B  Preoperative assessment   Issue remains does she have afib, and if so enough to justify anticoagulation.  She has been intolerant of  transcutaneous monitoring, so will proceed with ILR insertion.  Have reviewed today the benefits, goals of therapy and risks and she is agreeable to proceeding  We will stop ASA as it was started years ago for "primary prevention."    As relates to presurgical clearance, while she is largely nonambulatory 2/2 pain, her surgery is low risk, her cardiac history benign, her renal function poor but not too poor so her risks should be acceptable    Follow-up:  6/29 As Scheduled     Current medicines are reviewed at length with the patient today.   The patient  concerns regarding her medicines.  The following changes were made today:    Labs/ tests ordered today include: ILR at 6/29 visit for afib detection No orders of the defined types were placed in this encounter.     Patient Risk:  after full review of this patients clinical status, I feel that they are at moderate  risk at this time.  Today, I have spent 14 minutes with the patient with telehealth technology discussing the above.  Signed, Virl Axe, MD  07/23/2021 8:43 AM     [address] [place] [id number] [phone] (office) [phone] (fax)

## 2021-07-23 NOTE — Patient Instructions (Addendum)
Medication Instructions:  - Your physician has recommended you make the following change in your medication:   1) STOP Aspirin  *If you need a refill on your cardiac medications before your next appointment, please call your pharmacy*   Lab Work: - none ordered  If you have labs (blood work) drawn today and your tests are completely normal, you will receive your results only by: [facility] (if you have MyChart) OR A paper copy in the mail If you have any lab test that is abnormal or we need to change your treatment, we will call you to review the results.   Testing/Procedures:  1) Loop Recorder Implant: (at your office visit on 08/22/21)  Your physician has recommended that you have an Implantable Loop Recorder (LINQ device) placed.   1) Please shower the night before and morning of your procedure with an antibacterial soap (any brand). 2) You may eat a light breakfast/ lunch prior to coming in. 3) You may take all of your regular medications as normal the day of your procedure 4) You may drive yourself home from the procedure 5) If you have someone with you, they will be asked to step out of the room during the implant   Follow-Up: At [facility], you and your health needs are our priority.  As part of our continuing mission to provide you with exceptional heart care, we have created designated Provider Care Teams.  These Care Teams include your primary Cardiologist (physician) and Advanced Practice Providers (APPs -  Physician Assistants and Nurse Practitioners) who all work together to provide you with the care you need, when you need it.  We recommend signing up for the patient portal called "MyChart".  Sign up information is provided on this After Visit Summary.  MyChart is used to connect with patients for Virtual Visits (Telemedicine).  Patients are able to view lab/test results, encounter notes, upcoming appointments, etc.  Non-urgent messages can be sent to your  provider as well.   To learn more about what you can do with MyChart, go to [email or web address].    Your next appointment:   As scheduled 08/22/21   The format for your next appointment:   In Person  Provider:   Virl Axe, MD    Other Instructions N/a  Important Information About [facility]

## 2021-07-23 NOTE — Telephone Encounter (Signed)
   Pre-operative Risk Assessment    Patient Name: Michele Moore  DOB: April 03, 1950 MRN: [id number]      Request for Surgical Clearance    Procedure:   Left Knee Revision Arthroplasty  Date of Surgery:  Clearance TBD                                 Surgeon:  [place] or Practice Name:  [facility]  Phone number:  [phone]  Fax number:  [phone]   Type of Clearance Requested:   - Medical  - Pharmacy:  Hold please  advise   Type of Anesthesia:  Not Indicated   Additional requests/questions:    Jonathon Jordan   07/23/2021, 7:42 AM

## 2021-07-23 NOTE — Telephone Encounter (Signed)
  Patient Consent for Virtual Visit        Michele Moore has provided verbal consent on 07/23/2021 for a virtual visit (video or telephone).   CONSENT FOR VIRTUAL VISIT FOR:  Michele Moore  By participating in this virtual visit I agree to the following:  I hereby voluntarily request, consent and authorize [facility] and its employed or contracted physicians, physician assistants, nurse practitioners or other licensed health care professionals (the Practitioner), to provide me with telemedicine health care services (the "Services") as deemed necessary by the treating Practitioner. I acknowledge and consent to receive the Services by the Practitioner via telemedicine. I understand that the telemedicine visit will involve communicating with the Practitioner through live audiovisual communication technology and the disclosure of certain medical information by electronic transmission. I acknowledge that I have been given the opportunity to request an in-person assessment or other available alternative prior to the telemedicine visit and am voluntarily participating in the telemedicine visit.  I understand that I have the right to withhold or withdraw my consent to the use of telemedicine in the course of my care at any time, without affecting my right to future care or treatment, and that the Practitioner or I may terminate the telemedicine visit at any time. I understand that I have the right to inspect all information obtained and/or recorded in the course of the telemedicine visit and may receive copies of available information for a reasonable fee.  I understand that some of the potential risks of receiving the Services via telemedicine include:  Delay or interruption in medical evaluation due to technological equipment failure or disruption; Information transmitted may not be sufficient (e.g. poor resolution of images) to allow for appropriate medical decision making by the Practitioner; and/or   In rare instances, security protocols could fail, causing a breach of personal health information.  Furthermore, I acknowledge that it is my responsibility to provide information about my medical history, conditions and care that is complete and accurate to the best of my ability. I acknowledge that Practitioner's advice, recommendations, and/or decision may be based on factors not within their control, such as incomplete or inaccurate data provided by me or distortions of diagnostic images or specimens that may result from electronic transmissions. I understand that the practice of medicine is not an exact science and that Practitioner makes no warranties or guarantees regarding treatment outcomes. I acknowledge that a copy of this consent can be made available to me via my patient portal ([facility]), or I can request a printed copy by calling the office of [facility].    I understand that my insurance will be billed for this visit.   I have read or had this consent read to me. I understand the contents of this consent, which adequately explains the benefits and risks of the Services being provided via telemedicine.  I have been provided ample opportunity to ask questions regarding this consent and the Services and have had my questions answered to my satisfaction. I give my informed consent for the services to be provided through the use of telemedicine in my medical care

## 2021-07-24 ENCOUNTER — Inpatient Hospital Stay: Payer: Medicare Other

## 2021-07-24 VITALS — BP 109/58 | HR 70 | Temp 96.2°F | Resp 18

## 2021-07-24 DIAGNOSIS — N1832 Chronic kidney disease, stage 3b: Secondary | ICD-10-CM | POA: Diagnosis not present

## 2021-07-24 DIAGNOSIS — M069 Rheumatoid arthritis, unspecified: Secondary | ICD-10-CM | POA: Diagnosis not present

## 2021-07-24 DIAGNOSIS — D631 Anemia in chronic kidney disease: Secondary | ICD-10-CM | POA: Diagnosis not present

## 2021-07-24 DIAGNOSIS — I129 Hypertensive chronic kidney disease with stage 1 through stage 4 chronic kidney disease, or unspecified chronic kidney disease: Secondary | ICD-10-CM | POA: Diagnosis not present

## 2021-07-24 DIAGNOSIS — D649 Anemia, unspecified: Secondary | ICD-10-CM

## 2021-07-24 DIAGNOSIS — N183 Chronic kidney disease, stage 3 unspecified: Secondary | ICD-10-CM

## 2021-07-24 DIAGNOSIS — M3500 Sicca syndrome, unspecified: Secondary | ICD-10-CM | POA: Diagnosis not present

## 2021-07-24 DIAGNOSIS — Z79899 Other long term (current) drug therapy: Secondary | ICD-10-CM | POA: Diagnosis not present

## 2021-07-24 MED ORDER — IRON SUCROSE 20 MG/ML IV SOLN
200.0000 mg | Freq: Once | INTRAVENOUS | Status: AC
  Administered 2021-07-24: 200 mg via INTRAVENOUS
  Filled 2021-07-24: qty 10

## 2021-07-24 MED ORDER — SODIUM CHLORIDE 0.9 % IV SOLN: 200.0000 mg | Freq: Once | INTRAVENOUS | Status: DC

## 2021-07-24 MED ORDER — SODIUM CHLORIDE 0.9 % IV SOLN
Freq: Once | INTRAVENOUS | Status: AC
  Filled 2021-07-24: qty 250

## 2021-08-06 ENCOUNTER — Encounter: Payer: Self-pay | Admitting: Student in an Organized Health Care Education/Training Program

## 2021-08-06 ENCOUNTER — Ambulatory Visit
Payer: Medicare Other | Attending: Student in an Organized Health Care Education/Training Program | Admitting: Student in an Organized Health Care Education/Training Program

## 2021-08-06 VITALS — BP 126/69 | HR 89 | Temp 97.4°F | Ht 71.0 in | Wt 259.0 lb

## 2021-08-06 DIAGNOSIS — M1712 Unilateral primary osteoarthritis, left knee: Secondary | ICD-10-CM

## 2021-08-06 DIAGNOSIS — M19012 Primary osteoarthritis, left shoulder: Secondary | ICD-10-CM

## 2021-08-06 DIAGNOSIS — M47816 Spondylosis without myelopathy or radiculopathy, lumbar region: Secondary | ICD-10-CM | POA: Diagnosis not present

## 2021-08-06 DIAGNOSIS — M1612 Unilateral primary osteoarthritis, left hip: Secondary | ICD-10-CM

## 2021-08-06 DIAGNOSIS — M47812 Spondylosis without myelopathy or radiculopathy, cervical region: Secondary | ICD-10-CM

## 2021-08-06 DIAGNOSIS — M25562 Pain in left knee: Secondary | ICD-10-CM | POA: Diagnosis not present

## 2021-08-06 DIAGNOSIS — Z96652 Presence of left artificial knee joint: Secondary | ICD-10-CM | POA: Diagnosis not present

## 2021-08-06 DIAGNOSIS — M19011 Primary osteoarthritis, right shoulder: Secondary | ICD-10-CM

## 2021-08-06 DIAGNOSIS — M5136 Other intervertebral disc degeneration, lumbar region: Secondary | ICD-10-CM | POA: Diagnosis not present

## 2021-08-06 DIAGNOSIS — G894 Chronic pain syndrome: Secondary | ICD-10-CM | POA: Diagnosis not present

## 2021-08-06 DIAGNOSIS — G8929 Other chronic pain: Secondary | ICD-10-CM

## 2021-08-06 MED ORDER — HYDROCODONE-ACETAMINOPHEN 7.5-325 MG PO TABS: 1.0000 | ORAL_TABLET | Freq: Three times a day (TID) | ORAL | 0 refills | Status: DC | PRN

## 2021-08-06 MED ORDER — GABAPENTIN 600 MG PO TABS: 600.0000 mg | ORAL_TABLET | Freq: Every day | ORAL | 5 refills | Status: DC

## 2021-08-06 MED ORDER — TIZANIDINE HCL 4 MG PO TABS: 4.0000 mg | ORAL_TABLET | Freq: Every day | ORAL | 5 refills | Status: DC

## 2021-08-06 NOTE — Progress Notes (Signed)
Nursing Pain Medication Assessment:  Safety precautions to be maintained throughout the outpatient stay will include: orient to surroundings, keep bed in low position, maintain call bell within reach at all times, provide assistance with transfer out of bed and ambulation.  Medication Inspection Compliance: Pill count conducted under aseptic conditions, in front of the patient. Neither the pills nor the bottle was removed from the patient's sight at any time. Once count was completed pills were immediately returned to the patient in their original bottle.  Medication: Hydrocodone/APAP Pill/Patch Count:  40 of 90 pills remain Pill/Patch Appearance: Markings consistent with prescribed medication Bottle Appearance: Standard pharmacy container. Clearly labeled. Filled Date: 5 / 57 / 2023 Last Medication intake:  TodaySafety precautions to be maintained throughout the outpatient stay will include: orient to surroundings, keep bed in low position, maintain call bell within reach at all times, provide assistance with transfer out of bed and ambulation.

## 2021-08-06 NOTE — Progress Notes (Signed)
PROVIDER NOTE: Information contained herein reflects review and annotations entered in association with encounter. Interpretation of such information and data should be left to medically-trained personnel. Information provided to patient can be located elsewhere in the medical record under "Patient Instructions". Document created using STT-dictation technology, any transcriptional errors that may result from process are unintentional.    Patient: Michele Moore  Service Category: E/M  Provider: Gillis Santa, MD  DOB: 08/31/1950  DOS: 08/06/2021  Specialty: Interventional Pain Management  MRN: [id number]  Setting: Ambulatory outpatient  PCP: Juline Patch, MD  Type: Established Patient    Referring Provider: Juline Patch, MD  Location: Office  Delivery: Face-to-face     HPI  Michele Moore, a 71 y.o. year old female, is here today because of her Arthritis of left knee [M17.12]. Ms. Clevinger primary complain today is Knee Pain (left) Last encounter: My last encounter with her was on 07/01/2021. Pertinent problems: Ms. Reckner has Chronic knee pain after total replacement of left knee joint; Lumbar radiculopathy; Lumbar degenerative disc disease; Chronic pain syndrome; Spinal stenosis, lumbar region, with neurogenic claudication; Cervical facet syndrome; Primary osteoarthritis of right shoulder; Sprain of anterior talofibular ligament of right ankle; Severe obesity (BMI 35.0-35.9 with comorbidity) ([place]); CKD (chronic kidney disease), stage III ([place]); Primary osteoarthritis of left hip; and Lumbar spondylosis on their pertinent problem list. Pain Assessment: Severity of Chronic pain is reported as a 9 /10. Location: Knee Left/pain radiaties everywhere. Onset: More than a month ago. Quality: Aching, Burning, Constant, Throbbing, Sharp, Numbness, Discomfort, Stabbing. Timing: Constant. Modifying factor(s): meds, heat and laying. Vitals:  height is _0  (1.803 m) and weight is 259 lb (117.5 kg). Her temperature  is 97.4 F (36.3 C) (abnormal). Her blood pressure is 126/69 and her pulse is 89. Her oxygen saturation is 97%.   Reason for encounter:   Elly presents today for medication management.  No significant change in her medical history since her last clinic visit with me.  She has an upcoming appointment with Dr. Caryl Comes with cardiology for evaluation of her atrial fibrillation. She also has plans for her left total knee revision arthroplasty with Dr. Marry Guan. She continues to see Dr. Holley Raring with nephrology for management of her chronic kidney disease.  Pharmacotherapy Assessment  Analgesic: Norco 7.5 mg TID PRN #90/month, 22.5    Monitoring: [place] PMP: PDMP reviewed during this encounter.       Pharmacotherapy: No side-effects or adverse reactions reported. Compliance: No problems identified. Effectiveness: Clinically acceptable.  Chauncey Fischer, RN  08/06/2021  2:10 PM  Sign when Signing Visit Nursing Pain Medication Assessment:  Safety precautions to be maintained throughout the outpatient stay will include: orient to surroundings, keep bed in low position, maintain call bell within reach at all times, provide assistance with transfer out of bed and ambulation.  Medication Inspection Compliance: Pill count conducted under aseptic conditions, in front of the patient. Neither the pills nor the bottle was removed from the patient's sight at any time. Once count was completed pills were immediately returned to the patient in their original bottle.  Medication: Hydrocodone/APAP Pill/Patch Count:  40 of 90 pills remain Pill/Patch Appearance: Markings consistent with prescribed medication Bottle Appearance: Standard pharmacy container. Clearly labeled. Filled Date: 5 / 43 / 2023 Last Medication intake:  TodaySafety precautions to be maintained throughout the outpatient stay will include: orient to surroundings, keep bed in low position, maintain call bell within reach at all times, provide assistance  with transfer  out of bed and ambulation.   UDS:  Summary  Date Value Ref Range Status  06/14/2020 Note  Final    Comment:    ==================================================================== [employer]ox[employer] Select 13 (MW) ==================================================================== Test                             Result       Flag       Units  Drug Present and Declared for Prescription Verification   Hydrocodone                    1019         EXPECTED   ng/mg creat   Hydromorphone                  166          EXPECTED   ng/mg creat   Dihydrocodeine                 71           EXPECTED   ng/mg creat   Norhydrocodone                 536          EXPECTED   ng/mg creat    Sources of hydrocodone include scheduled prescription medications.    Hydromorphone, dihydrocodeine and norhydrocodone are expected    metabolites of hydrocodone. Hydromorphone and dihydrocodeine are    also available as scheduled prescription medications.  ==================================================================== Test                      Result    Flag   Units      Ref Range   Creatinine              166              mg/dL      >=20 ==================================================================== Declared Medications:  The flagging and interpretation on this report are based on the  following declared medications.  Unexpected results may arise from  inaccuracies in the declared medications.   **Note: The testing scope of this panel includes these medications:   Hydrocodone (Norco)   **Note: The testing scope of this panel does not include the  following reported medications:   Acetaminophen (Norco)  Albuterol (Ventolin HFA)  Allopurinol (Zyloprim)  Aspirin  Betamethasone  Fluticasone (Flonase)  Gabapentin (Neurontin)  Hydralazine (Apresoline)  Hydrochlorothiazide (Maxzide)  Iron  Ketoconazole (Nizoral)  Levothyroxine (Synthroid)  Losartan (Cozaar)  Meclizine (Antivert)   Methotrexate  Metronidazole (Flagyl)  Mometasone (Nasonex)  Montelukast (Singulair)  Multivitamin  Mupirocin (Bactroban)  Omega-3 Fatty Acids  Omeprazole (Prilosec)  Polyethylene Glycol  Potassium (Klor-Con)  Tizanidine (Zanaflex)  Topical Diclofenac (Voltaren)  Triamcinolone (Kenalog)  Triamterene (Maxzide)  Vitamin D3 ==================================================================== For clinical consultation, please call [phone]. ====================================================================      ROS  Constitutional: Denies any fever or chills Gastrointestinal: No reported hemesis, hematochezia, vomiting, or acute GI distress Musculoskeletal:  Left knee pain Neurological: No reported episodes of acute onset apraxia, aphasia, dysarthria, agnosia, amnesia, paralysis, loss of coordination, or loss of consciousness  Medication Review  Apremilast, HYDROcodone-acetaminophen, Multiple Vitamins-Calcium, Polyethyl Glycol-Propyl Glycol, Vitamin D3, albuterol, allopurinol, betamethasone dipropionate, betamethasone valerate, calcipotriene, clobetasol ointment, dapagliflozin propanediol, diclofenac sodium, ferrous sulfate, fluticasone, furosemide, gabapentin, hydrALAZINE, ketoconazole, levothyroxine, losartan, meclizine, methotrexate, mometasone, montelukast, mupirocin ointment, omega-3 acid ethyl esters, omeprazole, potassium chloride SA, tiZANidine, triamcinolone cream,  and triamterene-hydrochlorothiazide  History Review  Allergy: Ms. Slight is allergic to ampicillin, penicillins, vancomycin, and vibramycin [doxycycline calcium]. Drug: Ms. Pecha  reports no history of drug use. Alcohol:  reports no history of alcohol use. Tobacco:  reports that she quit smoking about 28 years ago. Her smoking use included cigarettes. She has a 40.00 pack-year smoking history. She has never used smokeless tobacco. Social: Ms. Craw  reports that she quit smoking about 28 years ago. Her smoking  use included cigarettes. She has a 40.00 pack-year smoking history. She has never used smokeless tobacco. She reports that she does not drink alcohol and does not use drugs. Medical:  has a past medical history of Allergy, Anemia, Arthritis, Asthma, Cataract, Chronic kidney disease, Collagen vascular disease ([place]), Complication of anesthesia, Dyspnea, Dysrhythmia, Family history of adverse reaction to anesthesia, GERD (gastroesophageal reflux disease), Glaucoma, H/O tooth extraction, Hemorrhoid, History of hiatal hernia, History of kidney stones, Hypertension, Hypothyroidism, Migraines, Mixed hyperlipidemia, Multiple gastric ulcers, Thyroid disease, and Wears dentures. Surgical: Ms. Esther  has a past surgical history that includes Carpal tunnel release; Rotator cuff repair; Ankle surgery; Tubal ligation; Colonoscopy (2000?); Upper gi endoscopy (2000?); Tonsillectomy; Fusion of talonavicular joint (Right, 08/03/2015); Colonoscopy with propofol (N/A, 10/22/2017); Esophagogastroduodenoscopy (egd) with propofol (N/A, 10/22/2017); polypectomy (N/A, 10/22/2017); Total knee revision (Right, 01/20/2018); and Joint replacement (Right). Family: family history includes Arthritis in her mother; COPD in her sister; Cancer in her maternal aunt and maternal uncle; Diabetes in her father; Gout in her mother; Heart failure in her father and mother; Hyperlipidemia in her father; Hypertension in her mother; Stroke in her mother.  Laboratory Chemistry Profile   Renal Lab Results  Component Value Date   BUN 25 (H) 06/04/2021   CREATININE 1.67 (H) 06/04/2021   BCR 17 03/19/2021   GFRAA 30 (L) 08/28/2019   GFRNONAA 32 (L) 06/04/2021    Hepatic Lab Results  Component Value Date   AST 25 06/12/2020   ALT 16 06/12/2020   ALBUMIN 4.3 03/19/2021   ALKPHOS 98 06/12/2020    Electrolytes Lab Results  Component Value Date   NA 137 06/04/2021   K 3.7 06/04/2021   CL 104 06/04/2021   CALCIUM 9.8 06/04/2021   MG 2.0 10/01/2018    PHOS 2.9 (L) 03/19/2021    Bone No results found for: "VD25OH", "VD125OH2TOT", "RD4081KG8", "JE5631SH7", "25OHVITD1", "25OHVITD2", "25OHVITD3", "TESTOFREE", "TESTOSTERONE"  Inflammation (CRP: Acute Phase) (ESR: Chronic Phase) Lab Results  Component Value Date   CRP 0.9 04/23/2020   ESRSEDRATE 45 (H) 08/30/2020         Note: Above Lab results reviewed.  Recent Imaging Review  CT Angio Chest PE W and/or Wo Contrast CLINICAL DATA:  One week history of chest pain.  EXAM: CT ANGIOGRAPHY CHEST WITH CONTRAST  TECHNIQUE: Multidetector CT imaging of the chest was performed using the standard protocol during bolus administration of intravenous contrast. Multiplanar CT image reconstructions and MIPs were obtained to evaluate the vascular anatomy.  RADIATION DOSE REDUCTION: This exam was performed according to the departmental dose-optimization program which includes automated exposure control, adjustment of the mA and/or kV according to patient size and/or use of iterative reconstruction technique.  CONTRAST:  57m OMNIPAQUE IOHEXOL 350 MG/ML SOLN  COMPARISON:  09/07/2017  FINDINGS: Cardiovascular: The heart is normal in size. No pericardial effusion. The aorta is normal in caliber. No dissection. Scattered atherosclerotic calcifications mainly at the aortic arch. No obvious coronary artery calcifications. The pulmonary arterial tree is well opacified. No filling defects to  suggest pulmonary embolism.  Mediastinum/Nodes: No mediastinal or hilar mass or lymphadenopathy. Esophagus is grossly normal. Small hiatal hernia noted.  Lungs/Pleura: No acute pulmonary findings. Stable appearing mosaic pattern of ground-glass attenuation most likely due to small airways disease such as asthma respiratory bronchiolitis. Other possibilities would include hypersensitivity pneumonitis and cryptogenic organizing pneumonia. No worrisome pulmonary lesions or pulmonary nodules. No pleural  effusions or pleural nodules.  Upper Abdomen: No significant upper abdominal findings.  Musculoskeletal: No medial breast masses, supraclavicular or axillary adenopathy. The bony thorax is intact.  Review of the MIP images confirms the above findings.  IMPRESSION: 1. No CT findings for pulmonary embolism. 2. Normal caliber thoracic aorta. 3. Stable appearing mosaic pattern of ground-glass attenuation most likely due to small airways disease such as asthma respiratory bronchiolitis. Other possibilities would include hypersensitivity pneumonitis and cryptogenic organizing pneumonia. No focal pulmonary infiltrates or worrisome pulmonary lesions.  Aortic Atherosclerosis (ICD10-I70.0).  Electronically Signed   By: Marijo Sanes M.D.   On: 06/04/2021 21:45 DG Chest 2 View CLINICAL DATA:  Chest pain  EXAM: CHEST - 2 VIEW  COMPARISON:  04/24/2019  FINDINGS: The heart size and mediastinal contours are within normal limits. Aortic atherosclerosis. Both lungs are clear. The visualized skeletal structures are unremarkable. Chronic right AC joint widening.  IMPRESSION: No active cardiopulmonary disease.  Electronically Signed   By: Donavan Foil M.D.   On: 06/04/2021 17:15 Note: Reviewed        Physical Exam  General appearance: Well nourished, well developed, and well hydrated. In no apparent acute distress Mental status: Alert, oriented x 3 (person, place, & time)       Respiratory: No evidence of acute respiratory distress Eyes: PERLA Vitals: BP 126/69   Pulse 89   Temp (!) 97.4 F (36.3 C)   Ht _0  (1.803 m)   Wt 259 lb (117.5 kg)   SpO2 97%   BMI 36.12 kg/m  BMI: Estimated body mass index is 36.12 kg/m as calculated from the following:   Height as of this encounter: _1  (1.803 m).   Weight as of this encounter: 259 lb (117.5 kg). Ideal: Ideal body weight: 70.8 kg (156 lb 1.4 oz) Adjusted ideal body weight: 89.5 kg (197 lb 4 oz)  Cervical Spine Area Exam   Skin & Axial Inspection: No masses, redness, edema, swelling, or associated skin lesions Alignment: Symmetrical Functional ROM: Diminished ROM      Stability: No instability detected Muscle Tone/Strength: Functionally intact. No obvious neuro-muscular anomalies detected. Sensory (Neurological): Articular pain pattern and musculoskeletal Palpation: Complains of area being tender to palpation                          Upper Extremity (UE) Exam      Side: Right upper extremity   Side: Left upper extremity    Skin & Extremity Inspection: Edema of right wrist   Skin & Extremity Inspection: Skin color, temperature, and hair growth are WNL. No peripheral edema or cyanosis. No masses, redness, swelling, asymmetry, or associated skin lesions. No contractures.    Functional ROM: Decreased ROM for shoulder and elbow   Functional ROM: Unrestricted ROM            Muscle Tone/Strength: Functionally intact. No obvious neuro-muscular anomalies detected.   Muscle Tone/Strength: Functionally intact. No obvious neuro-muscular anomalies detected.    Sensory (Neurological): Musculoskeletal pain pattern           Sensory (Neurological): Unimpaired  Palpation: No palpable anomalies               Palpation: No palpable anomalies                Provocative Test(s):  Phalen's test: deferred Tinel's test: deferred Apley's scratch test (touch opposite shoulder):  Action 1 (Across chest): Decreased ROM Action 2 (Overhead): Decreased ROM Action 3 (LB reach): Decreased ROM     Provocative Test(s):  Phalen's test: deferred Tinel's test: deferred Apley's scratch test (touch opposite shoulder):  Action 1 (Across chest): Decreased ROM Action 2 (Overhead): Decreased ROM Action 3 (LB reach): Decreased ROM        Thoracic Spine Area Exam  Skin & Axial Inspection: No masses, redness, or swelling Alignment: Symmetrical Functional ROM: Unrestricted ROM Stability: No instability detected Muscle Tone/Strength:  Functionally intact. No obvious neuro-muscular anomalies detected. Sensory (Neurological): Unimpaired Muscle strength & Tone: No palpable anomalies   Lumbar Spine Area Exam  Skin & Axial Inspection: No masses, redness, or swelling Alignment: Symmetrical Functional ROM: Improved after treatment       Stability: No instability detected Muscle Tone/Strength: Functionally intact. No obvious neuro-muscular anomalies detected. Sensory (Neurological): Dermatomal pain pattern and referred SI joint   Gait & Posture Assessment  Ambulation: Limited Gait: Antalgic Posture: Difficulty standing up straight, due to pain    Lower Extremity Exam      Side: Right lower extremity   Side: Left lower extremity  Stability: No instability observed           Stability: No instability observed          Skin & Extremity Inspection: Skin color, temperature, and hair growth are WNL. No peripheral edema or cyanosis. No masses, redness, swelling, asymmetry, or associated skin lesions. No contractures.   Skin & Extremity Inspection: Skin color, temperature, and hair growth are WNL. No peripheral edema or cyanosis. No masses, redness, swelling, asymmetry, or associated skin lesions. No contractures.  Functional ROM: Unrestricted ROM                   Functional ROM: Decreased ROM for hip and knee joints          Muscle Tone/Strength: Functionally intact. No obvious neuro-muscular anomalies detected.   Muscle Tone/Strength: Functionally intact. No obvious neuro-muscular anomalies detected.  Sensory (Neurological): Unimpaired   Sensory (Neurological): Dermatomal pain pattern and arthropathic of left knee  Palpation: No palpable anomalies   Palpation: No palpable anomalies    Assessment   Diagnosis Status  1. Arthritis of left knee   2. Chronic knee pain after total replacement of left knee joint   3. Lumbar spondylosis   4. Lumbar degenerative disc disease   5. Primary osteoarthritis of left hip   6. Primary  osteoarthritis of left shoulder   7. Cervical spondylosis without myelopathy   8. Primary osteoarthritis of right shoulder   9. Chronic pain syndrome    Persistent Persistent Persistent   Plan of Care   Michele Moore has a current medication list which includes the following long-term medication(s): albuterol, allopurinol, ferrous sulfate, hydralazine, levothyroxine, losartan, montelukast, omega-3 acid ethyl esters, omeprazole, potassium chloride sa, triamterene-hydrochlorothiazide, gabapentin, [START ON 08/23/2021] hydrocodone-acetaminophen, [START ON 09/22/2021] hydrocodone-acetaminophen, [START ON 10/22/2021] hydrocodone-acetaminophen, [DISCONTINUED] fluticasone, and [DISCONTINUED] mometasone.  Pharmacotherapy (Medications Ordered): Meds ordered this encounter  Medications   HYDROcodone-acetaminophen (NORCO) 7.5-325 MG tablet    Sig: Take 1 tablet by mouth every 8 (eight) hours as needed for severe pain. Must last  30 days.    Dispense:  90 tablet    Refill:  0    Chronic Pain. (STOP Act - Not applicable). Fill one day early if closed on scheduled refill date.   HYDROcodone-acetaminophen (NORCO) 7.5-325 MG tablet    Sig: Take 1 tablet by mouth every 8 (eight) hours as needed for severe pain. Must last 30 days.    Dispense:  90 tablet    Refill:  0    Chronic Pain. (STOP Act - Not applicable). Fill one day early if closed on scheduled refill date.   HYDROcodone-acetaminophen (NORCO) 7.5-325 MG tablet    Sig: Take 1 tablet by mouth every 8 (eight) hours as needed for severe pain. Must last 30 days.    Dispense:  90 tablet    Refill:  0    Chronic Pain. (STOP Act - Not applicable). Fill one day early if closed on scheduled refill date.   gabapentin (NEURONTIN) 600 MG tablet    Sig: Take 1 tablet (600 mg total) by mouth at bedtime.    Dispense:  30 tablet    Refill:  5   tiZANidine (ZANAFLEX) 4 MG tablet    Sig: Take 1 tablet (4 mg total) by mouth at bedtime.    Dispense:  60 tablet     Refill:  5   Orders:  Orders Placed This Encounter  Procedures   ToxASSURE Select 13 (MW), Urine    Volume: 30 ml(s). Minimum 3 ml of urine is needed. Document temperature of fresh sample. Indications: Long term (current) use of opiate analgesic [phone])    Order Specific Question:   Release to patient    Answer:   Immediate   Follow-up plan:   Return in about 3 months (around 11/06/2021) for Medication Management, in person.    Recent Visits Date Type Provider Dept  07/01/21 Office Visit Gillis Santa, MD [facility]  05/29/21 Procedure visit Gillis Santa, MD [facility]  05/14/21 Office Visit Gillis Santa, MD [facility]  Showing recent visits within past 90 days and meeting all other requirements Today's Visits Date Type Provider Dept  08/06/21 Office Visit Gillis Santa, MD [facility]  Showing today's visits and meeting all other requirements Future Appointments No visits were found meeting these conditions. Showing future appointments within next 90 days and meeting all other requirements  I discussed the assessment and treatment plan with the patient. The patient was provided an opportunity to ask questions and all were answered. The patient agreed with the plan and demonstrated an understanding of the instructions.  Patient advised to call back or seek an in-person evaluation if the symptoms or condition worsens.  Duration of encounter: 77mnutes.  Note by: BGillis Santa MD Date: 08/06/2021; Time: 2:41 PM

## 2021-08-10 LAB — TOXASSURE SELECT 13 (MW), URINE

## 2021-08-13 DIAGNOSIS — G8929 Other chronic pain: Secondary | ICD-10-CM | POA: Diagnosis not present

## 2021-08-13 DIAGNOSIS — M1A00X0 Idiopathic chronic gout, unspecified site, without tophus (tophi): Secondary | ICD-10-CM | POA: Diagnosis not present

## 2021-08-13 DIAGNOSIS — M25572 Pain in left ankle and joints of left foot: Secondary | ICD-10-CM | POA: Diagnosis not present

## 2021-08-13 DIAGNOSIS — L4050 Arthropathic psoriasis, unspecified: Secondary | ICD-10-CM | POA: Diagnosis not present

## 2021-08-13 DIAGNOSIS — Z79899 Other long term (current) drug therapy: Secondary | ICD-10-CM | POA: Diagnosis not present

## 2021-08-13 DIAGNOSIS — R768 Other specified abnormal immunological findings in serum: Secondary | ICD-10-CM | POA: Diagnosis not present

## 2021-08-22 ENCOUNTER — Encounter: Payer: Self-pay | Admitting: Internal Medicine

## 2021-08-22 ENCOUNTER — Telehealth: Payer: Self-pay | Admitting: Cardiology

## 2021-08-22 ENCOUNTER — Ambulatory Visit (INDEPENDENT_AMBULATORY_CARE_PROVIDER_SITE_OTHER): Payer: Medicare Other | Admitting: Internal Medicine

## 2021-08-22 VITALS — BP 110/62 | HR 76 | Ht 71.0 in | Wt 261.0 lb

## 2021-08-22 DIAGNOSIS — I10 Essential (primary) hypertension: Secondary | ICD-10-CM

## 2021-08-22 DIAGNOSIS — Z95818 Presence of other cardiac implants and grafts: Secondary | ICD-10-CM

## 2021-08-22 DIAGNOSIS — R002 Palpitations: Secondary | ICD-10-CM

## 2021-08-22 DIAGNOSIS — I5032 Chronic diastolic (congestive) heart failure: Secondary | ICD-10-CM

## 2021-08-22 HISTORY — DX: Presence of other cardiac implants and grafts: Z95.818

## 2021-08-22 HISTORY — PX: LOOP RECORDER IMPLANT: SHX5954

## 2021-08-22 NOTE — Progress Notes (Signed)
Patient Care Team: Juline Patch, MD as PCP - General (Family Medicine) Anthonette Legato, MD (Nephrology) Gillis Santa, MD as Consulting Physician (Pain Medicine) Hooten, Laurice Record, MD (Orthopedic Surgery) Earlie Server, MD as Consulting Physician (Oncology) Quintin Alto, MD as Consulting Physician (Rheumatology) [name], DPM as Referring Physician (Podiatry)   HPI  Michele Moore is a 71 y.o. female  seen in follow-up with a presumptive diagnosis of atrial fibrillation for which we do not have documentation.  She has a high CHA2DS2-VASc score, and so we elected to implant a loop recorder to look for atrial fibrillation so as to adjudicate decision regarding anticoagulation  Occasional flutters, chronic dyspnea.  Stable peripheral edema.  No chest pain.  Event recorder was reviewed, nonsustained atrial tachycardia.  Unfortunately only lasted 14 hours because of skin eruptions   DATE TEST EF    8/20 Myoview   55-60 % Normal perfusion   8/20 Echo   55-60 %               Date Cr K Hgb  3/23 1.67 3.7  9.7 chronic              Records and Results Reviewed   Past Medical History:  Diagnosis Date   Allergy    Anemia    Arthritis    Asthma    Cataract    Chronic kidney disease    STAGE 3 PER DR EASON 08/02/15   Collagen vascular disease ([place])    Complication of anesthesia    WOKE UP DURING COLONOSCOPY   Dyspnea    Dysrhythmia    IRREGULAR HEART BEAT   Family history of adverse reaction to anesthesia    sister difficult to put to sleep   GERD (gastroesophageal reflux disease)    Glaucoma    H/O tooth extraction    all lower teeth 1/19   Hemorrhoid    History of hiatal hernia    History of kidney stones    H/O   Hypertension    Hypothyroidism    Migraines    Mixed hyperlipidemia    Multiple gastric ulcers    Thyroid disease    Wears dentures    partial upper and lower    Past Surgical History:  Procedure Laterality Date   ANKLE SURGERY     CARPAL  TUNNEL RELEASE     x3   COLONOSCOPY  2000?   COLONOSCOPY WITH PROPOFOL N/A 10/22/2017   Procedure: COLONOSCOPY WITH PROPOFOL;  Surgeon: Lucilla Lame, MD;  Location: [place];  Service: Endoscopy;  Laterality: N/A;   ESOPHAGOGASTRODUODENOSCOPY (EGD) WITH PROPOFOL N/A 10/22/2017   Procedure: ESOPHAGOGASTRODUODENOSCOPY (EGD) WITH PROPOFOL;  Surgeon: Lucilla Lame, MD;  Location: [place];  Service: Endoscopy;  Laterality: N/A;   FUSION OF TALONAVICULAR JOINT Right 08/03/2015   Procedure: TAILOR NAVICULAR JOINT FUSION - RIGHT ;  Surgeon: [name], DPM;  Location: [facility];  Service: Podiatry;  Laterality: Right;   JOINT REPLACEMENT Right    knee x 3 ,right x1 and left x2   POLYPECTOMY N/A 10/22/2017   Procedure: POLYPECTOMY;  Surgeon: Lucilla Lame, MD;  Location: [place];  Service: Endoscopy;  Laterality: N/A;   ROTATOR CUFF REPAIR     TONSILLECTOMY     TOTAL KNEE REVISION Right 01/20/2018   Procedure: POLYETHYLENE EXCHANGE;  Surgeon: Dereck Leep, MD;  Location: [facility];  Service: Orthopedics;  Laterality: Right;   TUBAL LIGATION  UPPER GI ENDOSCOPY  2000?    Current Meds  Medication Sig   albuterol (VENTOLIN HFA) 108 (90 Base) MCG/ACT inhaler Inhale 2 puffs into the lungs every 6 (six) hours as needed for wheezing or shortness of breath.   allopurinol (ZYLOPRIM) 100 MG tablet Take 200 mg by mouth every morning.    betamethasone dipropionate 0.05 % cream APPLY TO PSORIASIS ON HANDS TWICE DAILY ONLY. AVOID FACE, GROIN AND AXILLA   betamethasone valerate (VALISONE) 0.1 % cream Apply topically 2 (two) times daily as needed (Rash).   calcipotriene (DOVONOX) 0.005 % cream APPLY TOPICALLY TO PSORIASIS AREAS ON LEGS AND FEET ONCE OR TWICE DAILY   Cholecalciferol (VITAMIN D3) 2000 units TABS Take 2,000 Units by mouth daily.   clobetasol ointment (TEMOVATE) 0.05 % Apply topically 2 (two) times daily.   diclofenac sodium (VOLTAREN) 1 % GEL Apply 2 g topically  3 (three) times daily.    FARXIGA 10 MG TABS tablet Take 10 mg by mouth every morning.   ferrous sulfate 325 (65 FE) MG tablet Take 325 mg by mouth daily with breakfast.    furosemide (LASIX) 40 MG tablet Take 40 mg by mouth daily.   gabapentin (NEURONTIN) 600 MG tablet Take 1 tablet (600 mg total) by mouth at bedtime.   hydrALAZINE (APRESOLINE) 50 MG tablet Take 1 tablet by mouth twice daily   [START ON 08/23/2021] HYDROcodone-acetaminophen (NORCO) 7.5-325 MG tablet Take 1 tablet by mouth every 8 (eight) hours as needed for severe pain. Must last 30 days.   [START ON 09/22/2021] HYDROcodone-acetaminophen (NORCO) 7.5-325 MG tablet Take 1 tablet by mouth every 8 (eight) hours as needed for severe pain. Must last 30 days.   [START ON 10/22/2021] HYDROcodone-acetaminophen (NORCO) 7.5-325 MG tablet Take 1 tablet by mouth every 8 (eight) hours as needed for severe pain. Must last 30 days.   ketoconazole (NIZORAL) 2 % cream Apply topically daily as needed.   levothyroxine (SYNTHROID) 175 MCG tablet Take 1 tablet (175 mcg total) by mouth daily.   losartan (COZAAR) 25 MG tablet Take 1 tablet (25 mg total) by mouth every morning.   meclizine (ANTIVERT) 12.5 MG tablet TAKE 1 TABLET(12.5 MG) BY MOUTH THREE TIMES DAILY AS NEEDED FOR DIZZINESS   methotrexate (RHEUMATREX) 2.5 MG tablet Take 7.5 mg by mouth every Friday. Take 5 tablets by mouth once weekly   montelukast (SINGULAIR) 10 MG tablet TAKE 1 TABLET(10 MG) BY MOUTH AT BEDTIME   Multiple Vitamins-Calcium (ONE-A-DAY WOMENS FORMULA PO) Take 1 tablet by mouth daily.   mupirocin ointment (BACTROBAN) 2 % Apply 1 application topically 2 (two) times daily.   omega-3 acid ethyl esters (LOVAZA) 1 g capsule Take 1 capsule (1 g total) by mouth 2 (two) times daily.   omeprazole (PRILOSEC) 40 MG capsule TAKE 1 CAPSULE(40 MG) BY MOUTH DAILY   OTEZLA 30 MG TABS Take 1 tablet by mouth daily.   Polyethyl Glycol-Propyl Glycol (SYSTANE OP) Place 1 drop into both eyes daily  as needed (dry eyes).    potassium chloride SA (KLOR-CON M) 20 MEQ tablet Take 0.5 tablets (10 mEq total) by mouth daily.   tiZANidine (ZANAFLEX) 4 MG tablet Take 1 tablet (4 mg total) by mouth at bedtime.   triamcinolone (KENALOG) 0.1 % Apply 1 application topically 2 (two) times daily.   triamterene-hydrochlorothiazide (MAXZIDE) 75-50 MG tablet Take 1 tablet by mouth daily.    Allergies  Allergen Reactions   Ampicillin Swelling   Penicillins Anaphylaxis and Swelling    IgE =  10 (11/05/2017)  Has patient had a PCN reaction causing immediate rash, facial/tongue/throat swelling, SOB or lightheadedness with hypotension: Yes Has patient had a PCN reaction causing severe rash involving mucus membranes or skin necrosis: No Has patient had a PCN reaction that required hospitalization: No Has patient had a PCN reaction occurring within the last 10 years: Yes If all of the above answers are "NO", then may proceed with Cephalosporin use.   Vancomycin Rash    Other reaction(s): Other (see comments)   Vibramycin [Doxycycline Calcium] Rash      Review of Systems negative except from HPI and PMH  Physical Exam BP 110/62 (BP Location: Left Arm, Patient Position: Sitting, Cuff Size: Large)   Pulse 76   Ht '5\' 11"'$  (1.803 m)   Wt 261 lb (118.4 kg)   SpO2 97%   BMI 36.40 kg/m  Well developed and well nourished in no acute distress HENT normal E scleral and icterus clear Neck Supple Clear to ausculation Regular rate and rhythm, no murmurs gallops or rub Soft with active bowel sounds No clubbing cyanosis Trace Edema Alert and oriented, grossly normal motor and sensory function Skin Warm and Dry  ECG sinus at 76 16/07/38 PACs PVC  CrCl cannot be calculated (Patient's most recent lab result is older than the maximum 21 days allowed.).   Assessment and  Plan Atrial fibrillation by report   Palpitations   HTN  Anemia - chronic recently worse on iron therapy most recent labs are not  consistent with ongoing iron deficiency   HFpEF   Renal insufficiency Estimated Creatinine Clearance: 43.9 mL/min (A) (by C-G formula based on SCr of 1.67 mg/dL (H)).  Grade 3B   She is euvolemic.  We will continue her furosemide at 40 and her Farxiga at 10.    Blood pressure is well controlled.  Continue hydralazine 50 twice daily losartan 25 and triamterene hydrochlorothiazide.  Electrolytes are within range.  She is here today for the implantation of a loop recorder to look for atrial fibrillation Pre op Dx palpitations with dx of atrial fibrillation Post op Dx Same  Procedure  Loop Recorder implantation  After routine prep and drape of the left parasternal area, a small incision was created. A Med[employer]onic LINQ Reveal Loop Recorder  Serial Number  [id number] was inserted.    SteriStrip dressing was  applied.  The patient tolerated the procedure without apparent complication.  EBL < 10cc     Current medicines are reviewed at length with the patient today .  The patient does not have concerns regarding medicines.

## 2021-08-22 NOTE — Telephone Encounter (Signed)
Cardiology on-call Received a call from patient regarding ILR implant site w/ bleeding and soreness. She states gauze covering steri-strips has blood stain, but no active bleeding outside the bandage. Advised pt to go to the ED to have the bandage re-dressed if there is significant bleeding but she refused. Will notify Dr. Caryl Comes. Rudean Curt, MD , [facility] 08/22/21 10:14 PM

## 2021-08-22 NOTE — Patient Instructions (Addendum)
Medication Instructions:  - Your physician recommends that you continue on your current medications as directed. Please refer to the Current Medication list given to you today.  *If you need a refill on your cardiac medications before your next appointment, please call your pharmacy*   Lab Work: - none ordered  If you have labs (blood work) drawn today and your tests are completely normal, you will receive your results only by: [place] (if you have MyChart) OR A paper copy in the mail If you have any lab test that is abnormal or we need to change your treatment, we will call you to review the results.   Testing/Procedures: - none ordered   Follow-Up: At [facility], you and your health needs are our priority.  As part of our continuing mission to provide you with exceptional heart care, we have created designated Provider Care Teams.  These Care Teams include your primary Cardiologist (physician) and Advanced Practice Providers (APPs -  Physician Assistants and Nurse Practitioners) who all work together to provide you with the care you need, when you need it.  We recommend signing up for the patient portal called "MyChart".  Sign up information is provided on this After Visit Summary.  MyChart is used to connect with patients for Virtual Visits (Telemedicine).  Patients are able to view lab/test results, encounter notes, upcoming appointments, etc.  Non-urgent messages can be sent to your provider as well.   To learn more about what you can do with MyChart, go to [email or web address].    Your next appointment:   As needed   The format for your next appointment:   In Person  Provider:   Virl Axe, MD    Other Instructions  Post Loop Recorder implant instructions:  1) You may shower tomorrow night 2) You may remove your tegaderm (top) dressing on Day 4 post procedure: Monday 08/26/21 3) You may remove your steri strips on Day 6 (Wednesday 08/28/21) post procedure  if they have not fallen off on their own. 4) If you have any bleeding issues or concerns with your implant site after getting home, please call our [facility] directly at [phone].   Important Information About Sugar

## 2021-08-23 ENCOUNTER — Telehealth: Payer: Self-pay | Admitting: Internal Medicine

## 2021-08-23 NOTE — Telephone Encounter (Signed)
Patient stated that blood was contained to bandage informed patient that if bloody drainage starts coming out from around the bandage to take clean cloth and hold pressure for 10 minutes, patient concerned about taking a shower today informed her she could hold for another day patient appreciative of call back

## 2021-08-23 NOTE — Telephone Encounter (Signed)
  1. Has your device fired? no  2. Is you device beeping? no  3. Are you experiencing draining or swelling at device site? Patient states the gauze is filled with blood, and the site hurts a little bit.   4. Are you calling to see if we received your device transmission? no  5. Have you passed out? No  She had called last night about this and spoke to the person on call.     Please route to [place]

## 2021-08-26 ENCOUNTER — Inpatient Hospital Stay: Payer: Medicare Other | Attending: Oncology

## 2021-08-26 DIAGNOSIS — Z7984 Long term (current) use of oral hypoglycemic drugs: Secondary | ICD-10-CM | POA: Diagnosis not present

## 2021-08-26 DIAGNOSIS — E039 Hypothyroidism, unspecified: Secondary | ICD-10-CM | POA: Insufficient documentation

## 2021-08-26 DIAGNOSIS — Z79631 Long term (current) use of antimetabolite agent: Secondary | ICD-10-CM | POA: Insufficient documentation

## 2021-08-26 DIAGNOSIS — R809 Proteinuria, unspecified: Secondary | ICD-10-CM | POA: Diagnosis not present

## 2021-08-26 DIAGNOSIS — N1832 Chronic kidney disease, stage 3b: Secondary | ICD-10-CM | POA: Insufficient documentation

## 2021-08-26 DIAGNOSIS — Z87442 Personal history of urinary calculi: Secondary | ICD-10-CM | POA: Insufficient documentation

## 2021-08-26 DIAGNOSIS — M3500 Sicca syndrome, unspecified: Secondary | ICD-10-CM | POA: Diagnosis not present

## 2021-08-26 DIAGNOSIS — Z79890 Hormone replacement therapy (postmenopausal): Secondary | ICD-10-CM | POA: Diagnosis not present

## 2021-08-26 DIAGNOSIS — M069 Rheumatoid arthritis, unspecified: Secondary | ICD-10-CM | POA: Insufficient documentation

## 2021-08-26 DIAGNOSIS — Z79899 Other long term (current) drug therapy: Secondary | ICD-10-CM | POA: Insufficient documentation

## 2021-08-26 DIAGNOSIS — K219 Gastro-esophageal reflux disease without esophagitis: Secondary | ICD-10-CM | POA: Insufficient documentation

## 2021-08-26 DIAGNOSIS — E782 Mixed hyperlipidemia: Secondary | ICD-10-CM | POA: Diagnosis not present

## 2021-08-26 DIAGNOSIS — Z8051 Family history of malignant neoplasm of kidney: Secondary | ICD-10-CM | POA: Diagnosis not present

## 2021-08-26 DIAGNOSIS — Z87891 Personal history of nicotine dependence: Secondary | ICD-10-CM | POA: Diagnosis not present

## 2021-08-26 DIAGNOSIS — Z803 Family history of malignant neoplasm of breast: Secondary | ICD-10-CM | POA: Insufficient documentation

## 2021-08-26 DIAGNOSIS — I129 Hypertensive chronic kidney disease with stage 1 through stage 4 chronic kidney disease, or unspecified chronic kidney disease: Secondary | ICD-10-CM | POA: Diagnosis not present

## 2021-08-26 DIAGNOSIS — D631 Anemia in chronic kidney disease: Secondary | ICD-10-CM | POA: Insufficient documentation

## 2021-08-26 LAB — IRON AND TIBC
Iron: 45 ug/dL (ref 28–170)
Saturation Ratios: 16 % (ref 10.4–31.8)
TIBC: 276 ug/dL (ref 250–450)
UIBC: 231 ug/dL

## 2021-08-26 LAB — CBC WITH DIFFERENTIAL/PLATELET
Abs Immature Granulocytes: 0.02 10*3/uL (ref 0.00–0.07)
Basophils Absolute: 0.1 10*3/uL (ref 0.0–0.1)
Basophils Relative: 1 %
Eosinophils Absolute: 0.1 10*3/uL (ref 0.0–0.5)
Eosinophils Relative: 2 %
HCT: 30.4 % — ABNORMAL LOW (ref 36.0–46.0)
Hemoglobin: 9.9 g/dL — ABNORMAL LOW (ref 12.0–15.0)
Immature Granulocytes: 0 %
Lymphocytes Relative: 18 %
Lymphs Abs: 1 10*3/uL (ref 0.7–4.0)
MCH: 30.2 pg (ref 26.0–34.0)
MCHC: 32.6 g/dL (ref 30.0–36.0)
MCV: 92.7 fL (ref 80.0–100.0)
Monocytes Absolute: 0.2 10*3/uL (ref 0.1–1.0)
Monocytes Relative: 4 %
Neutro Abs: 4.2 10*3/uL (ref 1.7–7.7)
Neutrophils Relative %: 75 %
Platelets: 171 10*3/uL (ref 150–400)
RBC: 3.28 MIL/uL — ABNORMAL LOW (ref 3.87–5.11)
RDW: 15.2 % (ref 11.5–15.5)
WBC: 5.7 10*3/uL (ref 4.0–10.5)
nRBC: 0 % (ref 0.0–0.2)

## 2021-08-26 LAB — COMPREHENSIVE METABOLIC PANEL
ALT: 16 U/L (ref 0–44)
AST: 29 U/L (ref 15–41)
Albumin: 4.1 g/dL (ref 3.5–5.0)
Alkaline Phosphatase: 78 U/L (ref 38–126)
Anion gap: 11 (ref 5–15)
BUN: 43 mg/dL — ABNORMAL HIGH (ref 8–23)
CO2: 25 mmol/L (ref 22–32)
Calcium: 10.1 mg/dL (ref 8.9–10.3)
Chloride: 105 mmol/L (ref 98–111)
Creatinine, Ser: 2.35 mg/dL — ABNORMAL HIGH (ref 0.44–1.00)
GFR, Estimated: 21 mL/min — ABNORMAL LOW (ref >60)
Glucose, Bld: 123 mg/dL — ABNORMAL HIGH (ref 70–99)
Potassium: 3.5 mmol/L (ref 3.5–5.1)
Sodium: 141 mmol/L (ref 135–145)
Total Bilirubin: 0.6 mg/dL (ref 0.3–1.2)
Total Protein: 7.4 g/dL (ref 6.5–8.1)

## 2021-08-26 LAB — FOLATE: Folate: >40 ng/mL (ref >5.9)

## 2021-08-26 LAB — FERRITIN: Ferritin: 244 ng/mL (ref 11–307)

## 2021-08-26 LAB — VITAMIN B12: Vitamin B-12: 554 pg/mL (ref 180–914)

## 2021-08-28 ENCOUNTER — Inpatient Hospital Stay: Payer: Medicare Other

## 2021-08-28 ENCOUNTER — Encounter: Payer: Self-pay | Admitting: Oncology

## 2021-08-28 ENCOUNTER — Inpatient Hospital Stay (HOSPITAL_BASED_OUTPATIENT_CLINIC_OR_DEPARTMENT_OTHER): Payer: Medicare Other | Admitting: Oncology

## 2021-08-28 VITALS — BP 134/85 | HR 104 | Temp 96.7°F | Ht 71.0 in | Wt 260.0 lb

## 2021-08-28 DIAGNOSIS — N1832 Chronic kidney disease, stage 3b: Secondary | ICD-10-CM | POA: Diagnosis not present

## 2021-08-28 DIAGNOSIS — Z79899 Other long term (current) drug therapy: Secondary | ICD-10-CM | POA: Diagnosis not present

## 2021-08-28 DIAGNOSIS — I129 Hypertensive chronic kidney disease with stage 1 through stage 4 chronic kidney disease, or unspecified chronic kidney disease: Secondary | ICD-10-CM | POA: Diagnosis not present

## 2021-08-28 DIAGNOSIS — D631 Anemia in chronic kidney disease: Secondary | ICD-10-CM

## 2021-08-28 DIAGNOSIS — M3500 Sicca syndrome, unspecified: Secondary | ICD-10-CM | POA: Diagnosis not present

## 2021-08-28 DIAGNOSIS — M069 Rheumatoid arthritis, unspecified: Secondary | ICD-10-CM | POA: Diagnosis not present

## 2021-08-28 DIAGNOSIS — Z79631 Long term (current) use of antimetabolite agent: Secondary | ICD-10-CM

## 2021-08-28 DIAGNOSIS — N183 Chronic kidney disease, stage 3 unspecified: Secondary | ICD-10-CM

## 2021-08-28 DIAGNOSIS — D649 Anemia, unspecified: Secondary | ICD-10-CM

## 2021-08-28 MED ORDER — EPOETIN ALFA-EPBX 10000 UNIT/ML IJ SOLN
10000.0000 [IU] | Freq: Once | INTRAMUSCULAR | Status: AC
  Administered 2021-08-28: 10000 [IU] via SUBCUTANEOUS
  Filled 2021-08-28: qty 1

## 2021-08-28 NOTE — Progress Notes (Signed)
Hematology/Oncology Progress note [phone] [phone]         Clinic Day:  08/28/2021  Referring physician: Juline Patch, MD  Chief Complaint: Michele Moore is a 71 y.o. female presents for anemia in stage IIIb chronic kidney disease   Michele Moore is a 71 y.o.afemale who has above oncology history reviewed by me today presented for follow up visit for management of anemia in CKD  PERTINENT HEMATOLOGY HISTORY Patient previously followed up by Dr.Corcoran, patient switched care to me on 08/30/20 Extensive medical record review was performed by me  # anemia in stage IIIb chronic kidney disease.  Work-up on 04/23/2020 revealed a hematocrit of 35.0, hemoglobin 11.4, MCV 92.3, platelets 168,000, WBC 4,600 with an ANC of 3000. Ferritin was 107 with an iron saturation of 37% and a TIBC of 315. Sed rate was 54 and CRP was 0.9.  Vitamin B12 was 429 and folate >100.0.   10/22/2017 Colonoscopy for heme positive stool revealed one 7 mm polyp in the ascending colon, one 3 mm polyp in the transverse colon, and three 1 to 4 mm polyps in the sigmoid colon. There were non-bleeding internal hemorrhoids. There were petechia(e) in the rectum. Pathology showed two tubular adenomas and one hyperplastic polyp. EGD on 10/22/2017 showed gastritis and one gastric polyp.  She has Sjogren's syndrome, rheumatoid arthritis, hypertension, and proteinuria.  She takes oral iron once a day.  Patient follows up with pain clinic for pain management.  She previously tolerated IV Venofer treatments.  INTERVAL HISTORY Michele Moore is a 71 y.o. female who has above history reviewed by me today presents for follow up visit for anemia secondary to chronic kidney disease. She reports feeling well. + knee pain is worse.  There is plan of knee replacement.    Past Medical History:  Diagnosis Date   Allergy    Anemia    Arthritis    Asthma    Cataract    Chronic  kidney disease    STAGE 3 PER DR EASON 08/02/15   Collagen vascular disease ([place])    Complication of anesthesia    WOKE UP DURING COLONOSCOPY   Dyspnea    Dysrhythmia    IRREGULAR HEART BEAT   Family history of adverse reaction to anesthesia    sister difficult to put to sleep   GERD (gastroesophageal reflux disease)    Glaucoma    H/O tooth extraction    all lower teeth 1/19   Hemorrhoid    History of hiatal hernia    History of kidney stones    H/O   Hypertension    Hypothyroidism    Migraines    Mixed hyperlipidemia    Multiple gastric ulcers    Thyroid disease    Wears dentures    partial upper and lower    Past Surgical History:  Procedure Laterality Date   ANKLE SURGERY     CARPAL TUNNEL RELEASE     x3   COLONOSCOPY  2000?   COLONOSCOPY WITH PROPOFOL N/A 10/22/2017   Procedure: COLONOSCOPY WITH PROPOFOL;  Surgeon: Lucilla Lame, MD;  Location: [place];  Service: Endoscopy;  Laterality: N/A;   ESOPHAGOGASTRODUODENOSCOPY (EGD) WITH PROPOFOL N/A 10/22/2017   Procedure: ESOPHAGOGASTRODUODENOSCOPY (EGD) WITH PROPOFOL;  Surgeon: Lucilla Lame, MD;  Location: [place];  Service: Endoscopy;  Laterality: N/A;   FUSION OF TALONAVICULAR JOINT Right 08/03/2015   Procedure: TAILOR NAVICULAR JOINT FUSION - RIGHT ;  Surgeon: Larkin Ina  Vickki Muff, DPM;  Location: [facility];  Service: Podiatry;  Laterality: Right;   JOINT REPLACEMENT Right    knee x 3 ,right x1 and left x2   POLYPECTOMY N/A 10/22/2017   Procedure: POLYPECTOMY;  Surgeon: Lucilla Lame, MD;  Location: [place];  Service: Endoscopy;  Laterality: N/A;   ROTATOR CUFF REPAIR     TONSILLECTOMY     TOTAL KNEE REVISION Right 01/20/2018   Procedure: POLYETHYLENE EXCHANGE;  Surgeon: Dereck Leep, MD;  Location: [facility];  Service: Orthopedics;  Laterality: Right;   TUBAL LIGATION     UPPER GI ENDOSCOPY  2000?    Family History  Problem Relation Age of Onset   Heart failure Mother    Gout Mother     Arthritis Mother    Hypertension Mother    Stroke Mother    Heart failure Father    Diabetes Father    Hyperlipidemia Father    COPD Sister    Cancer Maternal Aunt        breast   Cancer Maternal Uncle        kidney   Breast cancer Neg Hx     Social History:  reports that she quit smoking about 28 years ago. Her smoking use included cigarettes. She has a 40.00 pack-year smoking history. She has never used smokeless tobacco. She reports that she does not drink alcohol and does not use drugs. She denies current alcohol or tobacco use. She quit smoking remotely. She denies exposure to radiation or toxins. She retired at age 62. 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 The patient is alone today.  Allergies:  Allergies  Allergen Reactions   Ampicillin Swelling   Penicillins Anaphylaxis and Swelling    IgE = 10 (11/05/2017)  Has patient had a PCN reaction causing immediate rash, facial/tongue/throat swelling, SOB or lightheadedness with hypotension: Yes Has patient had a PCN reaction causing severe rash involving mucus membranes or skin necrosis: No Has patient had a PCN reaction that required hospitalization: No Has patient had a PCN reaction occurring within the last 10 years: Yes If all of the above answers are "NO", then may proceed with Cephalosporin use.   Vancomycin Rash    Other reaction(s): Other (see comments)   Vibramycin [Doxycycline Calcium] Rash    Current Medications: Current Outpatient Medications  Medication Sig Dispense Refill   albuterol (VENTOLIN HFA) 108 (90 Base) MCG/ACT inhaler Inhale 2 puffs into the lungs every 6 (six) hours as needed for wheezing or shortness of breath. 8 g 2   allopurinol (ZYLOPRIM) 100 MG tablet Take 200 mg by mouth every morning.      betamethasone dipropionate 0.05 % cream APPLY TO PSORIASIS ON HANDS TWICE DAILY ONLY. AVOID FACE, GROIN AND AXILLA 45 g 0   betamethasone valerate (VALISONE) 0.1 % cream Apply topically 2 (two) times daily as  needed (Rash). 45 g 3   calcipotriene (DOVONOX) 0.005 % cream APPLY TOPICALLY TO PSORIASIS AREAS ON LEGS AND FEET ONCE OR TWICE DAILY 540 g 1   Cholecalciferol (VITAMIN D3) 2000 units TABS Take 2,000 Units by mouth daily.     clobetasol ointment (TEMOVATE) 0.05 % Apply topically 2 (two) times daily.     diclofenac sodium (VOLTAREN) 1 % GEL Apply 2 g topically 3 (three) times daily.      FARXIGA 10 MG TABS tablet Take 10 mg by mouth every morning.     ferrous sulfate 325 (65 FE) MG tablet Take 325 mg by  mouth daily with breakfast.      furosemide (LASIX) 40 MG tablet Take 40 mg by mouth daily.     gabapentin (NEURONTIN) 600 MG tablet Take 1 tablet (600 mg total) by mouth at bedtime. 30 tablet 5   hydrALAZINE (APRESOLINE) 50 MG tablet Take 1 tablet by mouth twice daily 180 tablet 1   HYDROcodone-acetaminophen (NORCO) 7.5-325 MG tablet Take 1 tablet by mouth every 8 (eight) hours as needed for severe pain. Must last 30 days. 90 tablet 0   [START ON 09/22/2021] HYDROcodone-acetaminophen (NORCO) 7.5-325 MG tablet Take 1 tablet by mouth every 8 (eight) hours as needed for severe pain. Must last 30 days. 90 tablet 0   [START ON 10/22/2021] HYDROcodone-acetaminophen (NORCO) 7.5-325 MG tablet Take 1 tablet by mouth every 8 (eight) hours as needed for severe pain. Must last 30 days. 90 tablet 0   ketoconazole (NIZORAL) 2 % cream Apply topically daily as needed. 15 g 2   levothyroxine (SYNTHROID) 175 MCG tablet Take 1 tablet (175 mcg total) by mouth daily. 90 tablet 0   losartan (COZAAR) 25 MG tablet Take 1 tablet (25 mg total) by mouth every morning. 90 tablet 1   meclizine (ANTIVERT) 12.5 MG tablet TAKE 1 TABLET(12.5 MG) BY MOUTH THREE TIMES DAILY AS NEEDED FOR DIZZINESS 30 tablet 0   methotrexate (RHEUMATREX) 2.5 MG tablet Take 7.5 mg by mouth every Friday. Take 5 tablets by mouth once weekly     montelukast (SINGULAIR) 10 MG tablet TAKE 1 TABLET(10 MG) BY MOUTH AT BEDTIME 90 tablet 1   Multiple  Vitamins-Calcium (ONE-A-DAY WOMENS FORMULA PO) Take 1 tablet by mouth daily.     mupirocin ointment (BACTROBAN) 2 % Apply 1 application topically 2 (two) times daily. 22 g 0   omega-3 acid ethyl esters (LOVAZA) 1 g capsule Take 1 capsule (1 g total) by mouth 2 (two) times daily. 180 capsule 1   omeprazole (PRILOSEC) 40 MG capsule TAKE 1 CAPSULE(40 MG) BY MOUTH DAILY 90 capsule 1   OTEZLA 30 MG TABS Take 1 tablet by mouth daily.     Polyethyl Glycol-Propyl Glycol (SYSTANE OP) Place 1 drop into both eyes daily as needed (dry eyes).      potassium chloride SA (KLOR-CON M) 20 MEQ tablet Take 0.5 tablets (10 mEq total) by mouth daily. 45 tablet 1   tiZANidine (ZANAFLEX) 4 MG tablet Take 1 tablet (4 mg total) by mouth at bedtime. 60 tablet 5   triamcinolone (KENALOG) 0.1 % Apply 1 application topically 2 (two) times daily. 30 g 0   triamterene-hydrochlorothiazide (MAXZIDE) 75-50 MG tablet Take 1 tablet by mouth daily. 90 tablet 1   No current facility-administered medications for this visit.    Review of Systems  Constitutional:  Negative for chills, diaphoresis, fever, malaise/fatigue and weight loss.  HENT:  Negative for congestion, ear discharge, ear pain, hearing loss, nosebleeds, sinus pain, sore throat and tinnitus.   Eyes:        Glaucoma  Respiratory:  Positive for shortness of breath (on exertion, due to asthma). Negative for cough, hemoptysis and sputum production.   Cardiovascular:  Positive for leg swelling (right ankle). Negative for chest pain and palpitations.  Gastrointestinal:  Negative for abdominal pain, blood in stool, constipation, diarrhea, heartburn, melena, nausea and vomiting.  Genitourinary:  Negative for dysuria, frequency, hematuria and urgency.  Musculoskeletal:  Positive for joint pain (arthritis). Negative for back pain, myalgias and neck pain.  Skin:  Negative for itching and rash.  Neurological:  Positive for sensory change (foot numbness s/p surgery). Negative for  dizziness, tingling, weakness and headaches.  Endo/Heme/Allergies:  Does not bruise/bleed easily.  Psychiatric/Behavioral:  Negative for depression and memory loss. The patient is not nervous/anxious and does not have insomnia.   All other systems reviewed and are negative.   Performance status (ECOG): 1-2  Vitals Blood pressure 134/85, pulse (!) 104, temperature (!) 96.7 F (35.9 C), temperature source Tympanic, height '5\' 11"'$  (1.803 m), weight 260 lb (117.9 kg).   Physical Exam Vitals and nursing note reviewed.  Constitutional:      General: She is not in acute distress.    Appearance: She is not diaphoretic.  Eyes:     General: No scleral icterus.    Conjunctiva/sclera: Conjunctivae normal.  Neurological:     Mental Status: She is alert and oriented to person, place, and time.  Psychiatric:        Behavior: Behavior normal.        Thought Content: Thought content normal.        Judgment: Judgment normal.    Appointment on 08/26/2021  Component Date Value Ref Range Status   Iron 08/26/2021 45  28 - 170 ug/dL Final   TIBC 08/26/2021 276  250 - 450 ug/dL Final   Saturation Ratios 08/26/2021 16  10.4 - 31.8 % Final   UIBC 08/26/2021 231  ug/dL Final   Performed at [facility], [address] [place], [place] [address]   Vitamin B-12 08/26/2021 554  180 - 914 pg/mL Final   Comment: (NOTE) This assay is not validated for testing neonatal or myeloproliferative syndrome specimens for Vitamin B12 levels. Performed at [facility], [place] [address], [place], [place] [address]    Folate 08/26/2021 >40.0  >5.9 ng/mL Final   Comment: RESULT CONFIRMED BY MANUAL DILUTION MU Performed at [facility], [address] [place], [place] [address]    Ferritin 08/26/2021 244  11 - 307 ng/mL Final   Performed at [facility], [place], [place] [address]   Sodium 08/26/2021 141  135 - 145 mmol/L Final   Potassium 08/26/2021 3.5  3.5 - 5.1  mmol/L Final   Chloride 08/26/2021 105  98 - 111 mmol/L Final   CO2 08/26/2021 25  22 - 32 mmol/L Final   Glucose, Bld 08/26/2021 123 (H)  70 - 99 mg/dL Final   Glucose reference range applies only to samples taken after fasting for at least 8 hours.   BUN 08/26/2021 43 (H)  8 - 23 mg/dL Final   Creatinine, Ser 08/26/2021 2.35 (H)  0.44 - 1.00 mg/dL Final   Calcium 08/26/2021 10.1  8.9 - 10.3 mg/dL Final   Total Protein 08/26/2021 7.4  6.5 - 8.1 g/dL Final   Albumin 08/26/2021 4.1  3.5 - 5.0 g/dL Final   AST 08/26/2021 29  15 - 41 U/L Final   ALT 08/26/2021 16  0 - 44 U/L Final   Alkaline Phosphatase 08/26/2021 78  38 - 126 U/L Final   Total Bilirubin 08/26/2021 0.6  0.3 - 1.2 mg/dL Final   GFR, Estimated 08/26/2021 21 (L)  >60 mL/min Final   Comment: (NOTE) Calculated using the CKD-EPI Creatinine Equation (2021)    Anion gap 08/26/2021 11  5 - 15 Final   Performed at [facility], [place], [place] [address]   WBC 08/26/2021 5.7  4.0 - 10.5 K/uL Final   RBC 08/26/2021 3.28 (L)  3.87 - 5.11 MIL/uL Final   Hemoglobin 08/26/2021 9.9 (L)  12.0 - 15.0 g/dL Final   HCT 08/26/2021 30.4 (L)  36.0 - 46.0 % Final   MCV 08/26/2021 92.7  80.0 - 100.0 fL Final   MCH 08/26/2021 30.2  26.0 - 34.0 pg Final   MCHC 08/26/2021 32.6  30.0 - 36.0 g/dL Final   RDW 08/26/2021 15.2  11.5 - 15.5 % Final   Platelets 08/26/2021 171  150 - 400 K/uL Final   nRBC 08/26/2021 0.0  0.0 - 0.2 % Final   Neutrophils Relative % 08/26/2021 75  % Final   Neutro Abs 08/26/2021 4.2  1.7 - 7.7 K/uL Final   Lymphocytes Relative 08/26/2021 18  % Final   Lymphs Abs 08/26/2021 1.0  0.7 - 4.0 K/uL Final   Monocytes Relative 08/26/2021 4  % Final   Monocytes Absolute 08/26/2021 0.2  0.1 - 1.0 K/uL Final   Eosinophils Relative 08/26/2021 2  % Final   Eosinophils Absolute 08/26/2021 0.1  0.0 - 0.5 K/uL Final   Basophils Relative 08/26/2021 1  % Final   Basophils Absolute 08/26/2021 0.1  0.0 - 0.1 K/uL  Final   Immature Granulocytes 08/26/2021 0  % Final   Abs Immature Granulocytes 08/26/2021 0.02  0.00 - 0.07 K/uL Final   Performed at [facility], [address], [place], [place] [address]    Assessment and Plan. 1. Anemia due to stage 3b chronic kidney disease ([place])   2. Long term methotrexate user   3. Stage 3 chronic kidney disease, unspecified whether stage 3a or 3b CKD ([place])     # Anemia in chronic kidney disease.  Patient tolerates IV Venofer treatments.  Labs Reviewed and discussed with patient.  Ferritin is at goal Hemoglobin 9.9,  Rationale and potential side effects of erythropoietin replacement therapy was reviewed and discussed with patient.  She agrees. Proceed with Retacrit 10,000 units x 1. Recommend monthly labs and Retacrit if hemoglobin is less than 10.   #Rheumatoid arthritis on methotrexate.  Continue folic acid 3 days/week.   #Chronic kidney disease, avoid nephrotoxin.  Encourage oral hydration.  Follow-up  Lab encounter H&H in 4 week, 8 weeks, 12 weeks + retacrit Lab in 16 weeks, prior to MD +/- venofer +/- retacrit. -iron labs.  We will check myeloma work-up.  Earlie Server, MD, PhD Hematology Oncology  08/28/2021

## 2021-09-03 DIAGNOSIS — R809 Proteinuria, unspecified: Secondary | ICD-10-CM | POA: Diagnosis not present

## 2021-09-03 DIAGNOSIS — N2581 Secondary hyperparathyroidism of renal origin: Secondary | ICD-10-CM | POA: Diagnosis not present

## 2021-09-03 DIAGNOSIS — N184 Chronic kidney disease, stage 4 (severe): Secondary | ICD-10-CM | POA: Diagnosis not present

## 2021-09-03 DIAGNOSIS — D631 Anemia in chronic kidney disease: Secondary | ICD-10-CM | POA: Diagnosis not present

## 2021-09-03 DIAGNOSIS — I10 Essential (primary) hypertension: Secondary | ICD-10-CM | POA: Diagnosis not present

## 2021-09-09 ENCOUNTER — Telehealth: Payer: Self-pay

## 2021-09-09 NOTE — Telephone Encounter (Signed)
LINQ alert received.  3 false AF events, SR/ST with ectopy, 6-71mn in duration, HR's 100-130 4 Symptom activation's showing SR/ST with ectopy 1 symptom activation correlating with false AF episode 7/16 @ 23:38, 151m of ST with ectopy, mean HR 120

## 2021-09-09 NOTE — Telephone Encounter (Signed)
Outreach made to Pt.  Advised that we were continuing to monitor her loop tracings and no atrial fibrillation had been discovered yet.  Advised Pt to push symptom activator if she felt her heart racing.  Pt was thankful for call back.

## 2021-09-10 ENCOUNTER — Other Ambulatory Visit: Payer: Self-pay | Admitting: Family Medicine

## 2021-09-10 DIAGNOSIS — I10 Essential (primary) hypertension: Secondary | ICD-10-CM

## 2021-09-11 NOTE — Telephone Encounter (Signed)
last RF 03/19/21 #90 1 RF (too soon) Requested Prescriptions  Refused Prescriptions Disp Refills  . losartan (COZAAR) 25 MG tablet [Pharmacy Med Name: LOSARTAN '25MG'$  TABLETS] 90 tablet 1    Sig: TAKE 1 TABLET(25 MG) BY MOUTH EVERY MORNING     Cardiovascular:  Angiotensin Receptor Blockers Failed - 09/10/2021  6:21 AM      Failed - Cr in normal range and within 180 days    Creatinine  Date Value Ref Range Status  12/27/2013 1.63 (H) 0.60 - 1.30 mg/dL Final   Creatinine, Ser  Date Value Ref Range Status  08/26/2021 2.35 (H) 0.44 - 1.00 mg/dL Final         Passed - K in normal range and within 180 days    Potassium  Date Value Ref Range Status  08/26/2021 3.5 3.5 - 5.1 mmol/L Final  12/27/2013 3.8 3.5 - 5.1 mmol/L Final         Passed - Patient is not pregnant      Passed - Last BP in normal range    BP Readings from Last 1 Encounters:  08/28/21 134/85         Passed - Valid encounter within last 6 months    Recent Outpatient Visits          5 months ago Hypertension, unspecified type   [facility] Juline Patch, MD   1 year ago [facility] Juline Patch, MD   1 year ago Hypertension, unspecified type   [facility] Juline Patch, MD   1 year ago Viral upper respiratory tract infection   [facility] Juline Patch, MD   1 year ago Hypertension, unspecified type   [facility] Juline Patch, MD      Future Appointments            In 5 days Juline Patch, MD [facility], [facility]

## 2021-09-16 ENCOUNTER — Encounter: Payer: Self-pay | Admitting: Family Medicine

## 2021-09-16 ENCOUNTER — Ambulatory Visit (INDEPENDENT_AMBULATORY_CARE_PROVIDER_SITE_OTHER): Payer: Medicare Other | Admitting: Family Medicine

## 2021-09-16 VITALS — BP 130/80 | HR 64 | Ht 71.0 in | Wt 260.0 lb

## 2021-09-16 DIAGNOSIS — E876 Hypokalemia: Secondary | ICD-10-CM | POA: Diagnosis not present

## 2021-09-16 DIAGNOSIS — B379 Candidiasis, unspecified: Secondary | ICD-10-CM

## 2021-09-16 DIAGNOSIS — R1319 Other dysphagia: Secondary | ICD-10-CM

## 2021-09-16 DIAGNOSIS — Z23 Encounter for immunization: Secondary | ICD-10-CM | POA: Diagnosis not present

## 2021-09-16 DIAGNOSIS — R42 Dizziness and giddiness: Secondary | ICD-10-CM | POA: Diagnosis not present

## 2021-09-16 DIAGNOSIS — K219 Gastro-esophageal reflux disease without esophagitis: Secondary | ICD-10-CM

## 2021-09-16 DIAGNOSIS — I10 Essential (primary) hypertension: Secondary | ICD-10-CM | POA: Diagnosis not present

## 2021-09-16 DIAGNOSIS — E039 Hypothyroidism, unspecified: Secondary | ICD-10-CM

## 2021-09-16 DIAGNOSIS — J069 Acute upper respiratory infection, unspecified: Secondary | ICD-10-CM | POA: Diagnosis not present

## 2021-09-16 MED ORDER — NYSTATIN 100000 UNIT/GM EX CREA
1.0000 | TOPICAL_CREAM | Freq: Two times a day (BID) | CUTANEOUS | 0 refills | Status: DC
Start: 2021-09-16 — End: 2023-09-25

## 2021-09-16 MED ORDER — MECLIZINE HCL 12.5 MG PO TABS: ORAL_TABLET | ORAL | 1 refills | Status: DC

## 2021-09-16 MED ORDER — OMEPRAZOLE 40 MG PO CPDR: DELAYED_RELEASE_CAPSULE | ORAL | 1 refills | Status: DC

## 2021-09-16 MED ORDER — LEVOTHYROXINE SODIUM 175 MCG PO TABS: 175.0000 ug | ORAL_TABLET | Freq: Every day | ORAL | 0 refills | Status: DC

## 2021-09-16 MED ORDER — POTASSIUM CHLORIDE CRYS ER 20 MEQ PO TBCR: 10.0000 meq | EXTENDED_RELEASE_TABLET | Freq: Every day | ORAL | 1 refills | Status: DC

## 2021-09-16 MED ORDER — HYDRALAZINE HCL 50 MG PO TABS: ORAL_TABLET | ORAL | 1 refills | Status: DC

## 2021-09-16 MED ORDER — MONTELUKAST SODIUM 10 MG PO TABS: ORAL_TABLET | ORAL | 1 refills | Status: DC

## 2021-09-16 MED ORDER — TRIAMTERENE-HCTZ 75-50 MG PO TABS: 1.0000 | ORAL_TABLET | Freq: Every day | ORAL | 1 refills | Status: DC

## 2021-09-16 MED ORDER — LOSARTAN POTASSIUM 25 MG PO TABS: 25.0000 mg | ORAL_TABLET | ORAL | 1 refills | Status: DC

## 2021-09-16 NOTE — Progress Notes (Signed)
Date:  09/16/2021   Name:  Michele Moore   DOB:  Nov 02, 1950   MRN:  [id number]   Chief Complaint: Hypertension, hypokalemia, Gastroesophageal Reflux, Allergic Rhinitis , Hypothyroidism, and Dysphagia  Hypertension This is a chronic problem. The problem has been gradually improving since onset. The problem is controlled. Pertinent negatives include no chest pain, headaches, neck pain, orthopnea, palpitations, PND or shortness of breath. There are no known risk factors for coronary artery disease. Past treatments include ACE inhibitors, diuretics and direct vasodilators. The current treatment provides moderate improvement. There are no compliance problems.  There is no history of angina, kidney disease, CAD/MI, CVA, heart failure, left ventricular hypertrophy, PVD or retinopathy. Identifiable causes of hypertension include a thyroid problem. There is no history of chronic renal disease, a hypertension causing med or renovascular disease.  Gastroesophageal Reflux She complains of dysphagia. She reports no abdominal pain, no chest pain, no coughing, no heartburn, no nausea, no sore throat or no wheezing. This is a chronic problem. The current episode started more than 1 year ago. The problem has been gradually worsening (dysphagia). Pertinent negatives include no fatigue. She has tried a PPI for the symptoms. The treatment provided moderate relief.  Thyroid Problem Presents for follow-up visit. Patient reports no anxiety, cold intolerance, constipation, diarrhea, fatigue or palpitations. There is no history of heart failure.    Lab Results  Component Value Date   NA 141 08/26/2021   K 3.5 08/26/2021   CO2 25 08/26/2021   GLUCOSE 123 (H) 08/26/2021   BUN 43 (H) 08/26/2021   CREATININE 2.35 (H) 08/26/2021   CALCIUM 10.1 08/26/2021   EGFR 30 (L) 03/19/2021   GFRNONAA 21 (L) 08/26/2021   Lab Results  Component Value Date   CHOL 198 03/19/2021   HDL 49 03/19/2021   LDLCALC 121 (H) 03/19/2021    TRIG 159 (H) 03/19/2021   CHOLHDL 4.2 10/01/2018   Lab Results  Component Value Date   TSH 1.280 06/12/2020   Lab Results  Component Value Date   HGBA1C 5.4 10/01/2018   Lab Results  Component Value Date   WBC 5.7 08/26/2021   HGB 9.9 (L) 08/26/2021   HCT 30.4 (L) 08/26/2021   MCV 92.7 08/26/2021   PLT 171 08/26/2021   Lab Results  Component Value Date   ALT 16 08/26/2021   AST 29 08/26/2021   ALKPHOS 78 08/26/2021   BILITOT 0.6 08/26/2021   No results found for: "25OHVITD2", "25OHVITD3", "VD25OH"   Review of Systems  Constitutional: Negative.  Negative for chills, fatigue, fever and unexpected weight change.  HENT:  Negative for congestion, ear discharge, ear pain, rhinorrhea, sinus pressure, sneezing and sore throat.   Respiratory:  Negative for cough, shortness of breath, wheezing and stridor.   Cardiovascular:  Negative for chest pain, palpitations, orthopnea and PND.  Gastrointestinal:  Positive for dysphagia. Negative for abdominal pain, blood in stool, constipation, diarrhea, heartburn and nausea.  Endocrine: Negative for cold intolerance.  Genitourinary:  Negative for dysuria, flank pain, frequency, hematuria, urgency and vaginal discharge.  Musculoskeletal:  Negative for arthralgias, back pain, myalgias and neck pain.  Skin:  Negative for rash.  Neurological:  Negative for dizziness, weakness and headaches.  Hematological:  Negative for adenopathy. Does not bruise/bleed easily.  Psychiatric/Behavioral:  Negative for dysphoric mood. The patient is not nervous/anxious.     Patient Active Problem List   Diagnosis Date Noted   Chronic instability of left knee 02/12/2021   Long term methotrexate  user 01/03/2021   Stage 3b chronic kidney disease (HCC) 01/03/2021   Piriformis syndrome of left side 11/15/2020   Normocytic anemia 04/22/2020   Lumbar spondylosis 02/16/2020   Arthritis of left knee 11/24/2019   Left hip pain 08/08/2019   Primary osteoarthritis of  left hip 08/08/2019   Non-seasonal allergic rhinitis 04/12/2019   Bursitis of left shoulder 03/24/2019   Benign essential hypertension 03/06/2019   Proteinuria 03/06/2019   Trochanteric bursitis of left hip 10/04/2018   SI joint arthritis (left) 10/04/2018   Exertional chest pain 09/30/2018   CKD (chronic kidney disease), stage III (HCC) 09/30/2018   Psoriatic arthritis (HCC) 04/23/2018   Trigger ring finger of left hand 04/14/2018   Female pelvic pain 01/20/2018   History of revision of total knee arthroplasty 01/20/2018   Iron deficiency anemia    Heme + stool    Acute gastritis without hemorrhage    Gastric polyps    Benign neoplasm of ascending colon    Benign neoplasm of transverse colon    Polyp of sigmoid colon    Hypertension 10/01/2017   Hypothyroidism 10/01/2017   Gastroesophageal reflux disease 10/01/2017   Hypokalemia 10/01/2017   Mixed hyperlipidemia 10/01/2017   Reactive airway disease 10/01/2017   Bradycardia 08/24/2017   Severe obesity (BMI 35.0-35.9 with comorbidity) (HCC) 08/18/2017   Chronic gouty arthropathy without tophi 06/25/2017   Encounter for long-term (current) use of high-risk medication 06/25/2017   Positive ANA (antinuclear antibody) 06/17/2017   Cervical facet syndrome 06/16/2017   Primary osteoarthritis of right shoulder 06/16/2017   Sprain of anterior talofibular ligament of right ankle 06/16/2017   Lumbar radiculopathy 04/21/2017   Lumbar degenerative disc disease 04/21/2017   Chronic pain syndrome 04/21/2017   Spinal stenosis, lumbar region, with neurogenic claudication 04/21/2017   SS-A antibody positive 03/05/2017   Chronic knee pain after total replacement of left knee joint 08/03/2015   DOE (dyspnea on exertion) 12/28/2013    Allergies  Allergen Reactions   Ampicillin Swelling   Penicillins Anaphylaxis and Swelling    IgE = 10 (11/05/2017)  Has patient had a PCN reaction causing immediate rash, facial/tongue/throat swelling, SOB  or lightheadedness with hypotension: Yes Has patient had a PCN reaction causing severe rash involving mucus membranes or skin necrosis: No Has patient had a PCN reaction that required hospitalization: No Has patient had a PCN reaction occurring within the last 10 years: Yes If all of the above answers are "NO", then may proceed with Cephalosporin use.   Vancomycin Rash    Other reaction(s): Other (see comments)   Vibramycin [Doxycycline Calcium] Rash    Past Surgical History:  Procedure Laterality Date   ANKLE SURGERY     CARPAL TUNNEL RELEASE     x3   COLONOSCOPY  2000?   COLONOSCOPY WITH PROPOFOL N/A 10/22/2017   Procedure: COLONOSCOPY WITH PROPOFOL;  Surgeon: Wohl, Darren, MD;  Location: [facility];  Service: Endoscopy;  Laterality: N/A;   ESOPHAGOGASTRODUODENOSCOPY (EGD) WITH PROPOFOL N/A 10/22/2017   Procedure: ESOPHAGOGASTRODUODENOSCOPY (EGD) WITH PROPOFOL;  Surgeon: Wohl, Darren, MD;  Location: [facility];  Service: Endoscopy;  Laterality: N/A;   FUSION OF TALONAVICULAR JOINT Right 08/03/2015   Procedure: TAILOR NAVICULAR JOINT FUSION - RIGHT ;  Surgeon: Justin Fowler, DPM;  Location: [facility];  Service: Podiatry;  Laterality: Right;   JOINT REPLACEMENT Right    knee x 3 ,right x1 and left x2   POLYPECTOMY N/A 10/22/2017   Procedure: POLYPECTOMY;  Surgeon: Wohl, Darren, MD;    Location: [facility];  Service: Endoscopy;  Laterality: N/A;   ROTATOR CUFF REPAIR     TONSILLECTOMY     TOTAL KNEE REVISION Right 01/20/2018   Procedure: POLYETHYLENE EXCHANGE;  Surgeon: Dereck Leep, MD;  Location: [facility];  Service: Orthopedics;  Laterality: Right;   TUBAL LIGATION     UPPER GI ENDOSCOPY  2000?    Social History   Tobacco Use   Smoking status: Former    Packs/day: 2.00    Years: 20.00    Total pack years: 40.00    Types: Cigarettes    Quit date: 02/24/1993    Years since quitting: 28.5   Smokeless tobacco: Never  Vaping Use   Vaping Use: Never used   Substance Use Topics   Alcohol use: No    Alcohol/week: 0.0 standard drinks of alcohol   Drug use: No     Medication list has been reviewed and updated.  Current Meds  Medication Sig   albuterol (VENTOLIN HFA) 108 (90 Base) MCG/ACT inhaler Inhale 2 puffs into the lungs every 6 (six) hours as needed for wheezing or shortness of breath.   allopurinol (ZYLOPRIM) 100 MG tablet Take 200 mg by mouth every morning.    betamethasone dipropionate 0.05 % cream APPLY TO PSORIASIS ON HANDS TWICE DAILY ONLY. AVOID FACE, GROIN AND AXILLA   betamethasone valerate (VALISONE) 0.1 % cream Apply topically 2 (two) times daily as needed (Rash).   calcipotriene (DOVONOX) 0.005 % cream APPLY TOPICALLY TO PSORIASIS AREAS ON LEGS AND FEET ONCE OR TWICE DAILY   Cholecalciferol (VITAMIN D3) 2000 units TABS Take 2,000 Units by mouth daily.   clobetasol ointment (TEMOVATE) 0.05 % Apply topically 2 (two) times daily.   diclofenac sodium (VOLTAREN) 1 % GEL Apply 2 g topically 3 (three) times daily.    FARXIGA 10 MG TABS tablet Take 10 mg by mouth every morning.   ferrous sulfate 325 (65 FE) MG tablet Take 325 mg by mouth daily with breakfast.    furosemide (LASIX) 40 MG tablet Take 40 mg by mouth daily.   gabapentin (NEURONTIN) 600 MG tablet Take 1 tablet (600 mg total) by mouth at bedtime.   hydrALAZINE (APRESOLINE) 50 MG tablet Take 1 tablet by mouth twice daily   HYDROcodone-acetaminophen (NORCO) 7.5-325 MG tablet Take 1 tablet by mouth every 8 (eight) hours as needed for severe pain. Must last 30 days.   [START ON 09/22/2021] HYDROcodone-acetaminophen (NORCO) 7.5-325 MG tablet Take 1 tablet by mouth every 8 (eight) hours as needed for severe pain. Must last 30 days.   [START ON 10/22/2021] HYDROcodone-acetaminophen (NORCO) 7.5-325 MG tablet Take 1 tablet by mouth every 8 (eight) hours as needed for severe pain. Must last 30 days.   ketoconazole (NIZORAL) 2 % cream Apply topically daily as needed.   levothyroxine  (SYNTHROID) 175 MCG tablet Take 1 tablet (175 mcg total) by mouth daily.   losartan (COZAAR) 25 MG tablet Take 1 tablet (25 mg total) by mouth every morning.   meclizine (ANTIVERT) 12.5 MG tablet TAKE 1 TABLET(12.5 MG) BY MOUTH THREE TIMES DAILY AS NEEDED FOR DIZZINESS   methotrexate (RHEUMATREX) 2.5 MG tablet Take 7.5 mg by mouth every Friday. Take 5 tablets by mouth once weekly   montelukast (SINGULAIR) 10 MG tablet TAKE 1 TABLET(10 MG) BY MOUTH AT BEDTIME   Multiple Vitamins-Calcium (ONE-A-DAY WOMENS FORMULA PO) Take 1 tablet by mouth daily.   mupirocin ointment (BACTROBAN) 2 % Apply 1 application topically 2 (two) times daily.  omega-3 acid ethyl esters (LOVAZA) 1 g capsule Take 1 capsule (1 g total) by mouth 2 (two) times daily.   omeprazole (PRILOSEC) 40 MG capsule TAKE 1 CAPSULE(40 MG) BY MOUTH DAILY   OTEZLA 30 MG TABS Take 1 tablet by mouth daily.   Polyethyl Glycol-Propyl Glycol (SYSTANE OP) Place 1 drop into both eyes daily as needed (dry eyes).    potassium chloride SA (KLOR-CON M) 20 MEQ tablet Take 0.5 tablets (10 mEq total) by mouth daily.   tiZANidine (ZANAFLEX) 4 MG tablet Take 1 tablet (4 mg total) by mouth at bedtime.   triamcinolone (KENALOG) 0.1 % Apply 1 application topically 2 (two) times daily.   triamterene-hydrochlorothiazide (MAXZIDE) 75-50 MG tablet Take 1 tablet by mouth daily.       09/16/2021    1:43 PM 06/12/2020   10:20 AM 12/08/2019   11:17 AM 09/08/2019    2:14 PM  GAD 7 : Generalized Anxiety Score  Nervous, Anxious, on Edge 0 0 0 0  Control/stop worrying 0 0 0 0  Worry too much - different things 0 0 0 0  Trouble relaxing 3 0 0 0  Restless 0 0 0 0  Easily annoyed or irritable 0 0 0 0  Afraid - awful might happen 0 0 0 0  Total GAD 7 Score 3 0 0 0  Anxiety Difficulty Not difficult at all          09/16/2021    1:42 PM 05/14/2021   11:31 AM 03/20/2021    2:46 PM  Depression screen PHQ 2/9  Decreased Interest 0 0 0  Down, Depressed, Hopeless 0 0 0   PHQ - 2 Score 0 0 0  Altered sleeping 3    Tired, decreased energy 3    Change in appetite 0    Feeling bad or failure about yourself  0    Trouble concentrating 0    Moving slowly or fidgety/restless 0    Suicidal thoughts 0    PHQ-9 Score 6    Difficult doing work/chores Extremely dIfficult      BP Readings from Last 3 Encounters:  09/16/21 130/80  08/28/21 134/85  08/22/21 110/62    Physical Exam Vitals and nursing note reviewed. Exam conducted with a chaperone present.  Constitutional:      General: She is not in acute distress.    Appearance: She is not diaphoretic.  HENT:     Head: Normocephalic and atraumatic.     Right Ear: Tympanic membrane and external ear normal.     Left Ear: Tympanic membrane and external ear normal.     Nose: Nose normal.  Eyes:     General:        Right eye: No discharge.        Left eye: No discharge.     Conjunctiva/sclera: Conjunctivae normal.     Pupils: Pupils are equal, round, and reactive to light.  Neck:     Thyroid: No thyromegaly.     Vascular: No JVD.  Cardiovascular:     Rate and Rhythm: Normal rate and regular rhythm.     Chest Wall: PMI is not displaced.     Pulses: Normal pulses.     Heart sounds: Normal heart sounds, S1 normal and S2 normal. No murmur heard.    No systolic murmur is present.     No diastolic murmur is present.     No friction rub. No gallop.  Pulmonary:     Effort: Pulmonary   effort is normal.     Breath sounds: Normal breath sounds.  Abdominal:     General: Bowel sounds are normal.     Palpations: Abdomen is soft. There is no mass.     Tenderness: There is no abdominal tenderness. There is no guarding.  Musculoskeletal:        General: Normal range of motion.     Cervical back: Normal range of motion and neck supple.     Right lower leg: No edema.     Left lower leg: No edema.  Lymphadenopathy:     Cervical: No cervical adenopathy.  Skin:    General: Skin is warm and dry.  Neurological:      Mental Status: She is alert.     Deep Tendon Reflexes: Reflexes are normal and symmetric.     Wt Readings from Last 3 Encounters:  09/16/21 260 lb (117.9 kg)  08/28/21 260 lb (117.9 kg)  08/22/21 261 lb (118.4 kg)    BP 130/80   Pulse 64   Ht 5' 11" (1.803 m)   Wt 260 lb (117.9 kg)   BMI 36.26 kg/m   Assessment and Plan:  1. Hypertension, unspecified type Chronic.  Controlled.  Stable.  Blood pressure today is 130/80.  Asymptomatic.  Tolerating medications well.  Continue hydralazine 50 mg twice a day, losartan 25 mg once a morning, and triamterene hydrochlorothiazide 75-50 mg 1 a day.  We will check renal function panel for electrolytes and GFR. - hydrALAZINE (APRESOLINE) 50 MG tablet; Take 1 tablet by mouth twice daily  Dispense: 180 tablet; Refill: 1 - losartan (COZAAR) 25 MG tablet; Take 1 tablet (25 mg total) by mouth every morning.  Dispense: 90 tablet; Refill: 1 - triamterene-hydrochlorothiazide (MAXZIDE) 75-50 MG tablet; Take 1 tablet by mouth daily.  Dispense: 90 tablet; Refill: 1 - Renal Function Panel  2. Hypothyroidism, unspecified type Chronic.  Controlled.  Stable.  Continue levothyroxine 175 mcg daily pending thyroid function panel with TSH. - levothyroxine (SYNTHROID) 175 MCG tablet; Take 1 tablet (175 mcg total) by mouth daily.  Dispense: 90 tablet; Refill: 0 - Thyroid Panel With TSH  3. Viral upper respiratory tract infection Chronic.  Controlled.  Stable.  Continue Singulair 10 mg once a day. - montelukast (SINGULAIR) 10 MG tablet; TAKE 1 TABLET(10 MG) BY MOUTH AT BEDTIME  Dispense: 90 tablet; Refill: 1  4. Gastroesophageal reflux disease Chronic.  Controlled.  Stable.  Continue Prilosec 40 mg once a day. - omeprazole (PRILOSEC) 40 MG capsule; TAKE 1 CAPSULE(40 MG) BY MOUTH DAILY  Dispense: 90 capsule; Refill: 1  5. Hypokalemia Chronic.  Controlled.  Stable.  Currently being controlled on potassium chloride SA 20 mEq 1/2 tablets daily.  We will check renal  function panel to see if appropriately supplemented. - potassium chloride SA (KLOR-CON M) 20 MEQ tablet; Take 0.5 tablets (10 mEq total) by mouth daily.  Dispense: 45 tablet; Refill: 1 - Renal Function Panel  6. Esophageal dysphagia New onset.  Prior episode in the past..  Has seen Dr. Wall in the past and we will refer to Dr. Wall for evaluation. - Ambulatory referral to Gastroenterology  7. Vertigo Chronic.  Episodic.  Stable.  Refill meclizine 12.5 mg as needed. - meclizine (ANTIVERT) 12.5 MG tablet; TAKE 1 TABLET(12.5 MG) BY MOUTH THREE TIMES DAILY AS NEEDED FOR DIZZINESS  Dispense: 30 tablet; Refill: 1  8. Pneumococcal vaccination administered at current visit Discussed and administered. - Pneumococcal conjugate vaccine 20-valent - Ambulatory referral to Gastroenterology    9. Candidiasis Patient has a rash in her perianal area which I suspect is candidiasis in nature and we will apply nystatin cream twice a day. - nystatin cream (MYCOSTATIN); Apply 1 Application topically 2 (two) times daily.  Dispense: 30 g; Refill: 0

## 2021-09-17 LAB — RENAL FUNCTION PANEL
Albumin: 4.3 g/dL (ref 3.8–4.8)
BUN/Creatinine Ratio: 15 (ref 12–28)
BUN: 36 mg/dL — ABNORMAL HIGH (ref 8–27)
CO2: 23 mmol/L (ref 20–29)
Calcium: 10.2 mg/dL (ref 8.7–10.3)
Chloride: 99 mmol/L (ref 96–106)
Creatinine, Ser: 2.36 mg/dL — ABNORMAL HIGH (ref 0.57–1.00)
Glucose: 90 mg/dL (ref 70–99)
Phosphorus: 3 mg/dL (ref 3.0–4.3)
Potassium: 3.6 mmol/L (ref 3.5–5.2)
Sodium: 139 mmol/L (ref 134–144)
eGFR: 21 mL/min/{1.73_m2} — ABNORMAL LOW (ref >59)

## 2021-09-17 LAB — THYROID PANEL WITH TSH
Free Thyroxine Index: 3.9 (ref 1.2–4.9)
T3 Uptake Ratio: 31 % (ref 24–39)
T4, Total: 12.6 ug/dL — ABNORMAL HIGH (ref 4.5–12.0)
TSH: 0.057 u[IU]/mL — ABNORMAL LOW (ref 0.450–4.500)

## 2021-09-20 ENCOUNTER — Telehealth: Payer: Self-pay | Admitting: Internal Medicine

## 2021-09-20 NOTE — Telephone Encounter (Signed)
Attempted to call phone number below. Voicemail answered in regard to [employer] transportation.

## 2021-09-20 NOTE — Telephone Encounter (Signed)
Patient was returning call. Please advise ?

## 2021-09-20 NOTE — Telephone Encounter (Signed)
Left voicemail message to call back  

## 2021-09-20 NOTE — Telephone Encounter (Signed)
Pt needs someone to call pt's medicare insurance to allow her to go to [place] for the [employer].   [phone]   She states this needs to be handled before 08/03 or they will not take her.

## 2021-09-20 NOTE — Telephone Encounter (Signed)
Attempted to call patient in regard to clarification to question. No answer, LMTCB.

## 2021-09-23 NOTE — Telephone Encounter (Signed)
9 logged AF events 6-81mn in duration, burden 21.2%. 1 symptom activation correlating with 332m event, mean HR 120.   EGM's show SR/ST with ectopy. Route to triage for frequent tachy events with symptom correlation.  Patient reports during episodes she had chest pain and palpitations. Denies any symptoms at this time. Reports compliance with medications on file. Forwarding to Dr. KlCaryl Comesor review.

## 2021-09-24 ENCOUNTER — Telehealth: Payer: Self-pay | Admitting: Internal Medicine

## 2021-09-24 NOTE — Telephone Encounter (Signed)
Patient is calling to follow-up on her request for transportation.

## 2021-09-24 NOTE — Telephone Encounter (Signed)
I spoke with the patient. She was concerned as Medicaid had reached out to her about needing an authorization or paperwork filled out by our office for her to travel to [place] on 09/26/21 for a Pacer check. The patient was very frustrated and advised she could not come to [place] and not at 8:00 am for an appointment.  I have reviewed the patient's appointments and advised her that her monthly checks for her device that are scheduled, with the next one being 09/26/21, are just remote checks from home. She is aware that she does not need to come to the office for these.  The patient expressed relief and was very appreciative of the call back.

## 2021-09-24 NOTE — Discharge Instructions (Signed)
Instructions after Total Knee Replacement   Cimone Fahey P. Makari Portman, Jr., M.D.     Dept. of Orthopaedics & Sports Medicine  [facility]  [address]  [place], [place]  [address]  Phone: [phone]   Fax: [phone]    DIET: Drink plenty of non-alcoholic fluids. Resume your normal diet. Include foods high in fiber.  ACTIVITY:  You may use crutches or a walker with weight-bearing as tolerated, unless instructed otherwise. You may be weaned off of the walker or crutches by your Physical Therapist.  Do NOT place pillows under the knee. Anything placed under the knee could limit your ability to straighten the knee.   Continue doing gentle exercises. Exercising will reduce the pain and swelling, increase motion, and prevent muscle weakness.   Please continue to use the TED compression stockings for 6 weeks. You may remove the stockings at night, but should reapply them in the morning. Do not drive or operate any equipment until instructed.  WOUND CARE:  Continue to use the PolarCare or ice packs periodically to reduce pain and swelling. You may bathe or shower after the staples are removed at the first office visit following surgery.  MEDICATIONS: You may resume your regular medications. Please take the pain medication as prescribed on the medication. Do not take pain medication on an empty stomach. You have been given a prescription for a blood thinner (Lovenox or Coumadin). Please take the medication as instructed. (NOTE: After completing a 2 week course of Lovenox, take one Enteric-coated aspirin once a day. This along with elevation will help reduce the possibility of phlebitis in your operated leg.) Do not drive or drink alcoholic beverages when taking pain medications.  CALL THE OFFICE FOR: Temperature above 101 degrees Excessive bleeding or drainage on the dressing. Excessive swelling, coldness, or paleness of the toes. Persistent nausea and vomiting.  FOLLOW-UP:  You  should have an appointment to return to the office in 10-14 days after surgery. Arrangements have been made for continuation of Physical Therapy (either home therapy or outpatient therapy).   [facility] Department Directory         [email or web address]       [email or web address]          Cardiology  Appointments: [place] - [phone] [place] - [phone]  Endocrinology  Appointments: [place] - [phone] [place] - [phone]  Gastroenterology  Appointments: [place] - [phone] [place] - [phone]        General Surgery   Appointments: [place] - [phone]  Internal Medicine/Family Medicine  Appointments: [place] - [phone] [place] - [phone] [place] - [phone]  Metabolic and Weigh Loss Surgery  Appointments: [place] - [phone]        Neurology  Appointments: [place] - [phone] [place] - [phone]  Neurosurgery  Appointments: [place] - [phone]  Obstetrics & Gynecology  Appointments: [place] - [phone] [place] - [phone]        Pediatrics  Appointments: [place] - [phone] [place] - [phone]  Physiatry  Appointments: [place] -[phone]  Physical Therapy  Appointments: [place] - [phone] [place] - [phone]        Podiatry  Appointments: [place] - [phone] [place] - [phone]  Pulmonology  Appointments: [place] - [phone]  Rheumatology  Appointments: [place] - [phone]        [place] Location: [facility]  [address] [place], [place]  [address]  [place] Location: [facility] [address] [place], [place]  [address]  [place] Location: [facility] [address] [place], [place]  [address]    

## 2021-09-25 ENCOUNTER — Inpatient Hospital Stay: Payer: Medicare Other | Attending: Oncology

## 2021-09-25 ENCOUNTER — Inpatient Hospital Stay: Payer: Medicare Other

## 2021-09-25 VITALS — BP 118/60 | HR 75

## 2021-09-25 DIAGNOSIS — N1832 Chronic kidney disease, stage 3b: Secondary | ICD-10-CM | POA: Insufficient documentation

## 2021-09-25 DIAGNOSIS — Z79899 Other long term (current) drug therapy: Secondary | ICD-10-CM | POA: Diagnosis not present

## 2021-09-25 DIAGNOSIS — D631 Anemia in chronic kidney disease: Secondary | ICD-10-CM | POA: Diagnosis not present

## 2021-09-25 DIAGNOSIS — N183 Chronic kidney disease, stage 3 unspecified: Secondary | ICD-10-CM

## 2021-09-25 DIAGNOSIS — I129 Hypertensive chronic kidney disease with stage 1 through stage 4 chronic kidney disease, or unspecified chronic kidney disease: Secondary | ICD-10-CM | POA: Insufficient documentation

## 2021-09-25 DIAGNOSIS — D649 Anemia, unspecified: Secondary | ICD-10-CM

## 2021-09-25 LAB — CBC WITH DIFFERENTIAL/PLATELET
Abs Immature Granulocytes: 0.01 10*3/uL (ref 0.00–0.07)
Basophils Absolute: 0 10*3/uL (ref 0.0–0.1)
Basophils Relative: 0 %
Eosinophils Absolute: 0.2 10*3/uL (ref 0.0–0.5)
Eosinophils Relative: 3 %
HCT: 26.3 % — ABNORMAL LOW (ref 36.0–46.0)
Hemoglobin: 8.5 g/dL — ABNORMAL LOW (ref 12.0–15.0)
Immature Granulocytes: 0 %
Lymphocytes Relative: 18 %
Lymphs Abs: 1.3 10*3/uL (ref 0.7–4.0)
MCH: 30.5 pg (ref 26.0–34.0)
MCHC: 32.3 g/dL (ref 30.0–36.0)
MCV: 94.3 fL (ref 80.0–100.0)
Monocytes Absolute: 0.4 10*3/uL (ref 0.1–1.0)
Monocytes Relative: 6 %
Neutro Abs: 5.2 10*3/uL (ref 1.7–7.7)
Neutrophils Relative %: 73 %
Platelets: 166 10*3/uL (ref 150–400)
RBC: 2.79 MIL/uL — ABNORMAL LOW (ref 3.87–5.11)
RDW: 15.2 % (ref 11.5–15.5)
WBC: 7.1 10*3/uL (ref 4.0–10.5)
nRBC: 0 % (ref 0.0–0.2)

## 2021-09-25 LAB — COMPREHENSIVE METABOLIC PANEL
ALT: 15 U/L (ref 0–44)
AST: 26 U/L (ref 15–41)
Albumin: 4 g/dL (ref 3.5–5.0)
Alkaline Phosphatase: 71 U/L (ref 38–126)
Anion gap: 9 (ref 5–15)
BUN: 41 mg/dL — ABNORMAL HIGH (ref 8–23)
CO2: 26 mmol/L (ref 22–32)
Calcium: 9.7 mg/dL (ref 8.9–10.3)
Chloride: 103 mmol/L (ref 98–111)
Creatinine, Ser: 2.41 mg/dL — ABNORMAL HIGH (ref 0.44–1.00)
GFR, Estimated: 21 mL/min — ABNORMAL LOW (ref >60)
Glucose, Bld: 97 mg/dL (ref 70–99)
Potassium: 3 mmol/L — ABNORMAL LOW (ref 3.5–5.1)
Sodium: 138 mmol/L (ref 135–145)
Total Bilirubin: 0.3 mg/dL (ref 0.3–1.2)
Total Protein: 7.1 g/dL (ref 6.5–8.1)

## 2021-09-25 MED ORDER — EPOETIN ALFA-EPBX 10000 UNIT/ML IJ SOLN
10000.0000 [IU] | Freq: Once | INTRAMUSCULAR | Status: AC
  Administered 2021-09-25: 10000 [IU] via SUBCUTANEOUS
  Filled 2021-09-25: qty 1

## 2021-09-25 NOTE — Telephone Encounter (Signed)
Michele Moore, Michele Moore - 09/20/2021  2:18 PM Deboraha Sprang, MD [phone])  SentEmmie Moore September 25, 2021 12:18 PM  To: [name], RN; [name], RN          Message  With her tachycardia could we please have her hold her cozaar and try meto tartrate 25 bid  Thanks SK

## 2021-09-26 ENCOUNTER — Ambulatory Visit (INDEPENDENT_AMBULATORY_CARE_PROVIDER_SITE_OTHER): Payer: Medicare Other

## 2021-09-26 DIAGNOSIS — I5032 Chronic diastolic (congestive) heart failure: Secondary | ICD-10-CM | POA: Diagnosis not present

## 2021-09-26 LAB — KAPPA/LAMBDA LIGHT CHAINS
Kappa free light chain: 33.4 mg/L — ABNORMAL HIGH (ref 3.3–19.4)
Kappa, lambda light chain ratio: 1.81 — ABNORMAL HIGH (ref 0.26–1.65)
Lambda free light chains: 18.5 mg/L (ref 5.7–26.3)

## 2021-09-26 NOTE — Telephone Encounter (Signed)
Attempted to call the patient. No answer- I left a message to please call back.  

## 2021-09-27 ENCOUNTER — Encounter
Admission: RE | Admit: 2021-09-27 | Discharge: 2021-09-27 | Disposition: A | Discharge status: Home / Self Care | Payer: Medicare Other | Source: Ambulatory Visit | Attending: Orthopedic Surgery | Admitting: Orthopedic Surgery

## 2021-09-27 ENCOUNTER — Other Ambulatory Visit: Payer: Self-pay | Admitting: Oncology

## 2021-09-27 VITALS — HR 83 | Temp 98.3°F | Resp 20

## 2021-09-27 DIAGNOSIS — M2352 Chronic instability of knee, left knee: Secondary | ICD-10-CM

## 2021-09-27 DIAGNOSIS — N1832 Chronic kidney disease, stage 3b: Secondary | ICD-10-CM

## 2021-09-27 DIAGNOSIS — L4050 Arthropathic psoriasis, unspecified: Secondary | ICD-10-CM

## 2021-09-27 DIAGNOSIS — R809 Proteinuria, unspecified: Secondary | ICD-10-CM

## 2021-09-27 DIAGNOSIS — Z01812 Encounter for preprocedural laboratory examination: Secondary | ICD-10-CM

## 2021-09-27 DIAGNOSIS — D649 Anemia, unspecified: Secondary | ICD-10-CM | POA: Diagnosis not present

## 2021-09-27 DIAGNOSIS — Z96652 Presence of left artificial knee joint: Secondary | ICD-10-CM | POA: Diagnosis not present

## 2021-09-27 HISTORY — DX: Myoneural disorder, unspecified: G70.9

## 2021-09-27 LAB — CUP PACEART REMOTE DEVICE CHECK
Date Time Interrogation Session: 20230804085652
Implantable Pulse Generator Implant Date: 20230629

## 2021-09-27 LAB — COMPREHENSIVE METABOLIC PANEL
ALT: 13 U/L (ref 0–44)
AST: 23 U/L (ref 15–41)
Albumin: 4.2 g/dL (ref 3.5–5.0)
Alkaline Phosphatase: 74 U/L (ref 38–126)
Anion gap: 9 (ref 5–15)
BUN: 40 mg/dL — ABNORMAL HIGH (ref 8–23)
CO2: 28 mmol/L (ref 22–32)
Calcium: 10.1 mg/dL (ref 8.9–10.3)
Chloride: 102 mmol/L (ref 98–111)
Creatinine, Ser: 2.24 mg/dL — ABNORMAL HIGH (ref 0.44–1.00)
GFR, Estimated: 23 mL/min — ABNORMAL LOW (ref >60)
Glucose, Bld: 94 mg/dL (ref 70–99)
Potassium: 3.5 mmol/L (ref 3.5–5.1)
Sodium: 139 mmol/L (ref 135–145)
Total Bilirubin: 0.6 mg/dL (ref 0.3–1.2)
Total Protein: 7.3 g/dL (ref 6.5–8.1)

## 2021-09-27 LAB — C-REACTIVE PROTEIN: CRP: 1.6 mg/dL — ABNORMAL HIGH (ref <1.0)

## 2021-09-27 LAB — CBC
HCT: 26.2 % — ABNORMAL LOW (ref 36.0–46.0)
Hemoglobin: 8.4 g/dL — ABNORMAL LOW (ref 12.0–15.0)
MCH: 29.7 pg (ref 26.0–34.0)
MCHC: 32.1 g/dL (ref 30.0–36.0)
MCV: 92.6 fL (ref 80.0–100.0)
Platelets: 182 10*3/uL (ref 150–400)
RBC: 2.83 MIL/uL — ABNORMAL LOW (ref 3.87–5.11)
RDW: 15.4 % (ref 11.5–15.5)
WBC: 6 10*3/uL (ref 4.0–10.5)
nRBC: 0 % (ref 0.0–0.2)

## 2021-09-27 LAB — SEDIMENTATION RATE: Sed Rate: 79 mm/hr — ABNORMAL HIGH (ref 0–30)

## 2021-09-27 MED ORDER — METOPROLOL TARTRATE 25 MG PO TABS: 25.0000 mg | ORAL_TABLET | Freq: Two times a day (BID) | ORAL | 0 refills | Status: DC

## 2021-09-27 NOTE — Telephone Encounter (Signed)
Spoke w/ pt and reviewed Dr. Olin Pia recommendation.   She verbalizes understanding and repeated instructions back to me.  Medication list updated.  She is appreciative of the call.

## 2021-09-27 NOTE — Patient Instructions (Addendum)
Your procedure is scheduled on: Wednesday October 09, 2021. Report to Day Surgery inside [facility] 2nd floor, stop by admissions desk before getting on elevator.  To find out your arrival time please call [phone] between 1PM - 3PM on Tuesday October 08, 2021.  Remember: Instructions that are not followed completely may result in serious medical risk,  up to and including death, or upon the discretion of your surgeon and anesthesiologist your  surgery may need to be rescheduled.     _X__ 1. Do not eat food after midnight the night before your procedure.                 No chewing gum or hard candies. You may drink clear liquids up to 2 hours                 before you are scheduled to arrive for your surgery- DO not drink clear                 liquids within 2 hours of the start of your surgery.                 Clear Liquids include:  water, apple juice without pulp, clear Gatorade, G2 or                  Gatorade Zero (avoid Red/Purple/Blue), Black Coffee or Tea (Do not add                 anything to coffee or tea).  __X__2.   Complete the "Ensure Clear Pre-surgery Clear Carbohydrate Drink" provided to you, 2 hours before arrival. **If you are diabetic you will be provided with an alternative drink, Gatorade Zero or G2.  __X__3.  On the morning of surgery brush your teeth with toothpaste and water, you                may rinse your mouth with mouthwash if you wish.  Do not swallow any toothpaste of mouthwash.     _X__ 4.  No Alcohol for 24 hours before or after surgery.   _X__ 5.  Do Not Smoke or use e-cigarettes For 24 Hours Prior to Your Surgery.                 Do not use any chewable tobacco products for at least 6 hours prior to                 Surgery.  _X__  6.  Do not use any recreational drugs (marijuana, cocaine, heroin, ecstasy, MDMA or other)                For at least one week prior to your surgery.  Combination of these drugs with  anesthesia                May have life threatening results.  ____  7.  Bring all medications with you on the day of surgery if instructed.   __X__ 8.  Notify your doctor if there is any change in your medical condition      (cold, fever, infections).     Do not wear jewelry, make-up, hairpins, clips or nail polish. Do not wear lotions, powders, or perfumes. You may wear deodorant. Do not shave 48 hours prior to surgery. Men may shave face and neck. Do not bring valuables to the hospital.    [facility] is not responsible for any belongings or valuables.  Contacts, dentures or bridgework may not be worn into surgery. Leave your suitcase in the car. After surgery it may be brought to your room. For patients admitted to the hospital, discharge time is determined by your treatment team.   Patients discharged the day of surgery will not be allowed to drive home.   Make arrangements for someone to be with you for the first 24 hours of your Same Day Discharge.   __X__ Take these medicines the morning of surgery with A SIP OF WATER:    1. allopurinol (ZYLOPRIM) 100 MG  2. hydrALAZINE (APRESOLINE) 50 MG tablet  3. levothyroxine (SYNTHROID) 175 MCG  4. omeprazole (PRILOSEC) 40 MG   5. OTEZLA 30 MG TABS  6. metoprolol tartrate (LOPRESSOR) 25 MG tablet  7. HYDROcodone-acetaminophen (NORCO) 7.5-325 MG   ____ Fleet Enema (as directed)   __X__ Use CHG Soap (or wipes) as directed  ____ Use Benzoyl Peroxide Gel as instructed  __X__ Use inhalers on the day of surgery  __X__ Stop FARXIGA 10 MG TABS tablet 3  days prior to surgery    ____ Take 1/2 of usual insulin dose the night before surgery. No insulin the morning          of surgery.   ____ Call your PCP, cardiologist, or Pulmonologist if taking Coumadin/Plavix/aspirin and ask when to stop before your surgery.   __X__ One Week prior to surgery- Stop Anti-inflammatories such as Ibuprofen, Aleve, Advil, Motrin, meloxicam (MOBIC),  diclofenac, etodolac, ketorolac, Toradol, Daypro, piroxicam, Goody's or BC powders. OK TO USE TYLENOL IF NEEDED   __X__ Stop supplements until after surgery.Omega 3 acid, and folic acid    ____ Bring C-Pap to the hospital.    If you have any questions regarding your pre-procedure instructions,  Please call Pre-admit Testing at [phone]

## 2021-09-28 LAB — SURGICAL PCR SCREEN
MRSA, PCR: NEGATIVE
Staphylococcus aureus: NEGATIVE

## 2021-09-28 LAB — URINALYSIS, ROUTINE W REFLEX MICROSCOPIC
Bilirubin Urine: NEGATIVE
Glucose, UA: NEGATIVE mg/dL
Hgb urine dipstick: NEGATIVE
Ketones, ur: NEGATIVE mg/dL
Nitrite: NEGATIVE
Protein, ur: NEGATIVE mg/dL
Specific Gravity, Urine: 1.004 — ABNORMAL LOW (ref 1.005–1.030)
pH: 6 (ref 5.0–8.0)

## 2021-09-30 ENCOUNTER — Encounter: Payer: Self-pay | Admitting: Orthopedic Surgery

## 2021-09-30 LAB — MULTIPLE MYELOMA PANEL, SERUM
Albumin SerPl Elph-Mcnc: 3.2 g/dL (ref 2.9–4.4)
Albumin/Glob SerPl: 1.2 (ref 0.7–1.7)
Alpha 1: 0.3 g/dL (ref 0.0–0.4)
Alpha2 Glob SerPl Elph-Mcnc: 0.9 g/dL (ref 0.4–1.0)
B-Globulin SerPl Elph-Mcnc: 1 g/dL (ref 0.7–1.3)
Gamma Glob SerPl Elph-Mcnc: 0.7 g/dL (ref 0.4–1.8)
Globulin, Total: 2.9 g/dL (ref 2.2–3.9)
IgA: 106 mg/dL (ref 64–422)
IgG (Immunoglobin G), Serum: 807 mg/dL (ref 586–1602)
IgM (Immunoglobulin M), Srm: 72 mg/dL (ref 26–217)
Total Protein ELP: 6.1 g/dL (ref 6.0–8.5)

## 2021-09-30 NOTE — Progress Notes (Signed)
Perioperative Services  Pre-Admission/Anesthesia Testing Clinical Review  Date: 10/07/21  Patient Demographics:  Name: Michele Moore DOB:   01-02-1951 MRN:   [id number]  Planned Surgical Procedure(s):    Case: [id number] Date/Time: 10/09/21 1113   Procedure: LEFT TOTAL KNEE REVISION WITH POLYETHYLENE EXCHANGE. (Left: Knee)   Anesthesia type: Choice   Pre-op diagnosis:      Chronic pain of left knee M25.562, G89.29     Status post revision of total knee, left O13.086   Location: [facility] OR ROOM 01 / [place] [facility]   Surgeons: Dereck Leep, MD   NOTE: Available PAT nursing documentation and vital signs have been reviewed. Clinical nursing staff has updated patient's PMH/PSHx, current medication list, and drug allergies/intolerances to ensure comprehensive history available to assist in medical decision making as it pertains to the aforementioned surgical procedure and anticipated anesthetic course. Extensive review of available clinical information performed. [place] [facility] and PSHx updated with any diagnoses/procedures that  may have been inadvertently omitted during her intake with the pre-admission testing department's nursing staff.  Clinical Discussion:  Michele Moore is a 71 y.o. female who is submitted for pre-surgical anesthesia review and clearance prior to her undergoing the above procedure. Patient is a Former Smoker (40 pack years; quit 02/1993). Pertinent PMH includes: HFpEF, dysrhythmia (s/p ILR placement), aortic atherosclerosis, HTN, HLD, hypothyroidism, DOE, asthma, CKD-III, GERD (on daily PPI), multiple gastric ulcers, anemia of chronic renal disease, OA, RA (on MTX), psoriatic arthritis (on apremilast), lumbar DDD, chronic pain syndrome (on chronic opioid therapy).  Patient is followed by cardiology Caryl Comes, MD). She was last seen in the cardiology clinic on 08/22/2021; notes reviewed.  At the time of her clinic visit, patient denied any episodes of chest pain,  however she continued to experience episodes of chronic dyspnea, which was reportedly stable and at baseline.  Patient continues to experience occasional flutters in her chest.  She denied any episodes of PND, orthopnea, vertiginous symptoms, or presyncope/syncope.  Patient with chronic peripheral edema that is currently controlled with diuretic therapy and extremity elevation.  Patient with past medical history significant for cardiovascular diagnoses.  TTE performed on 01/13/2014 revealed a mildly reduced left ventricular systolic function with an EF of 45%.  There was apical hypokinesis noted.  There was moderate LVH.  Mild mitral annular calcification with no evidence of stenosis noted.  Left atrium and right ventricle was mildly enlarged.  There was moderate mitral, tricuspid, and pulmonary valve regurgitation.  All gradients were normal with no evidence of a significant transvalvular gradient to suggest stenosis.  Myocardial perfusion imaging study performed at 10/01/2018 revealed a normal left ventricular systolic function with an EF of 55 to 65%.  There was no evidence of stress-induced myocardial ischemia or arrhythmia; no scintigraphic evidence of scar.  Study was determined to be normal and low risk.  Most recent TTE was performed on 10/01/2018 revealing a normal left ventricular systolic function with mild concentric LVH; LVEF 55 to 60%. Diastolic Doppler parameters consistent with abnormal relaxation (G1DD).  RVSP could not be assessed.  Left atrium mildly dilated.  There was trivial mitral and tricuspid valve regurgitation.  There was no evidence of a significant transvalvular gradient to suggest stenosis.  Patient with a questionable history of atrial fibrillation.  Attempted long-term cardiac event monitor study, however due to rash associated with electrode pads, patient only device in place for 14 hours, thus yielding a failed study in terms of time worn.  Patient  ultimately underwent  Medtronic ILR placement on 08/22/2021.  Last device interrogation was on 09/27/2021, which showed 43 false positive atrial fibrillation episodes.  As a result, Cozaar was discontinued and metoprolol 25 mg twice daily was started.  Blood pressure well-controlled at 110/62 on currently prescribed diuretic (furosemide, triamterene/HCTZ), vasodilator (hydralazine), and beta-blocker (metoprolol tartrate) therapies.  Patient is not currently taking any type of lipid-lowering therapies for her HLD diagnosis or ASCVD prevention.  HFpEF well-managed with the addition of SGLT2i (dapagliflozin) and the aforementioned diuretic therapies..  Patient is not diabetic.  She does not have a history of OSAH.  Functional capacity somewhat limited by age, arthritides, and multiple medical comorbidities.  With that being said, patient felt to be able to achieve at least 4 METS of activity without experiencing angina/anginal equivalent symptoms.  No changes were made to her medication regimen.  Patient to follow-up with outpatient cardiology in 3 months or sooner if needed.  Michele Moore is scheduled for an elective LEFT TOTAL KNEE REVISION WITH POLYETHYLENE EXCHANGE on 10/09/2021 with Dr. Skip Estimable, MD. Given patient's past medical history significant for cardiovascular diagnoses, presurgical cardiac clearance was sought by the PAT team. Per cardiology, "she is stable from a cardiac standpoint with no angina, acute CHF symptoms, significant palpitations, lightheadedness/dizziness, or syncope. She is able to complete >4.0 METS without any chest pain or shortness of breath. Per RCRI, considered moderate risk with 6.6% chance of MI, pulmonary edema, ventricular fibrillation, cardiac arrest, or complete heart block.  Therefore, based on ACC/AHA guidelines, patient would be at an ACCEPTABLE risk for the planned procedure without further cardiovascular testing".  In review of her medication reconciliation, the patient is not noted to be  taking any type of anticoagulation or antiplatelet therapies that would need to be held during her perioperative course.  Patient reports previous perioperative complications with anesthesia in the past.  She describes (+) anesthesia awareness/premature emergence during anesthesia used for a routine colonoscopy.  Additionally, patient makes mention of the fact that her sister is "difficult to put to sleep".  In review of the available records, it is noted that patient underwent a neuraxial anesthetic course here at [facility] (ASA III) in 12/2017 without documented complications.      09/27/2021    1:13 PM 09/25/2021    1:00 PM 09/16/2021    1:42 PM  Vitals with BMI  Height   _0   Weight   260 lbs  BMI   96.22  Systolic  297 989  Diastolic  60 80  Pulse 83 75 64    Providers/Specialists:   NOTE: Primary physician provider listed below. Patient may have been seen by APP or partner within same practice.   PROVIDER ROLE / SPECIALTY LAST OV  Hooten, Laurice Record, MD Orthopedics (Surgeon) 09/27/2021  Juline Patch, MD Primary Care Provider 09/16/2021  Virl Axe, MD Cardiology/Electrophysiology 08/22/2021  Earlie Server, MD Hematology 08/28/2021  Gillis Santa, MD Pain Management 08/06/2021  Anthonette Legato, MD Nephrology 09/03/2021  Ephriam Jenkins, MD Rheumatology 08/13/2021   Allergies:  Ampicillin, Penicillins, Vancomycin, and Vibramycin [doxycycline calcium]  Current Home Medications:   No current facility-administered medications for this encounter.    acetaminophen (TYLENOL) 500 MG tablet   acetaminophen (TYLENOL) 650 MG CR tablet   albuterol (VENTOLIN HFA) 108 (90 Base) MCG/ACT inhaler   allopurinol (ZYLOPRIM) 100 MG tablet   betamethasone dipropionate 0.05 % cream   betamethasone valerate (VALISONE) 0.1 % cream   calcipotriene (DOVONOX)  0.005 % cream   Cholecalciferol (VITAMIN D3) 2000 units TABS   clobetasol ointment (TEMOVATE) 0.05 %    diclofenac sodium (VOLTAREN) 1 % GEL   FARXIGA 10 MG TABS tablet   ferrous sulfate 325 (65 FE) MG tablet   furosemide (LASIX) 40 MG tablet   gabapentin (NEURONTIN) 600 MG tablet   hydrALAZINE (APRESOLINE) 50 MG tablet   HYDROcodone-acetaminophen (NORCO) 7.5-325 MG tablet   HYDROcodone-acetaminophen (NORCO) 7.5-325 MG tablet   [START ON 10/22/2021] HYDROcodone-acetaminophen (NORCO) 7.5-325 MG tablet   ketoconazole (NIZORAL) 2 % cream   levothyroxine (SYNTHROID) 175 MCG tablet   meclizine (ANTIVERT) 12.5 MG tablet   methotrexate (RHEUMATREX) 2.5 MG tablet   metoprolol tartrate (LOPRESSOR) 25 MG tablet   montelukast (SINGULAIR) 10 MG tablet   Multiple Vitamins-Calcium (ONE-A-DAY WOMENS FORMULA PO)   mupirocin ointment (BACTROBAN) 2 %   nystatin cream (MYCOSTATIN)   omega-3 acid ethyl esters (LOVAZA) 1 g capsule   omeprazole (PRILOSEC) 40 MG capsule   OTEZLA 30 MG TABS   Polyethyl Glycol-Propyl Glycol (SYSTANE OP)   potassium chloride SA (KLOR-CON M) 20 MEQ tablet   tiZANidine (ZANAFLEX) 4 MG tablet   triamcinolone (KENALOG) 0.1 %   triamterene-hydrochlorothiazide (MAXZIDE) 75-50 MG tablet   History:   Past Medical History:  Diagnosis Date   (HFpEF) heart failure with preserved ejection fraction (HCC) 01/13/2014   a.) TTE 01/13/2014: EF 45%, apical HK, mod LVH, mild MAC, mild LAE/RVE, mod MR/TR/PR; b.) TTE 10/01/2018: EF 55-60%, mild MVH, mild LAE, triv MR/TR, G1DD   Allergy    ANA positive    Anemia of chronic renal failure, stage 3 (moderate), unspecified whether stage 3a or 3b CKD (HCC)    Aortic atherosclerosis (HCC)    Asthma    Cataract    Chronic pain syndrome    a.) followed by [facility] Holley Raring, MD)   Chronic, continuous use of opioids    CKD (chronic kidney disease), stage III (HCC)    Complication of anesthesia    a.) anesthesia awareness/premature emergence during colonoscopy   DDD (degenerative disc disease), lumbar    Dyspnea on exertion     Dysrhythmia    IRREGULAR HEART BEAT   Family history of adverse reaction to anesthesia    sister difficult to put to sleep   GERD (gastroesophageal reflux disease)    Glaucoma    Gout    H/O tooth extraction    all lower teeth 1/19   Hemorrhoid    History of hiatal hernia    History of kidney stones    Hypertension    Hypothyroidism    Implantable loop recorder present 08/22/2021   a.) s/p implantation of Medtronic LINQ Reveal ILR; serial #: [id number]   Long term current use of immunosuppressive drug    a.) MTX + apremilast   Migraines    Mixed hyperlipidemia    Multiple gastric ulcers    Osteoarthritis    Psoriasis    Psoriatic arthritis (HCC)    a.) on apremilast   Rheumatoid arthritis ([place])    a.) on MTX   SS-A antibody positive    Wears dentures    partial upper and lower   Past Surgical History:  Procedure Laterality Date   ANKLE SURGERY Right    CARPAL TUNNEL RELEASE     x3   COLONOSCOPY  2000?   COLONOSCOPY WITH PROPOFOL N/A 10/22/2017   Procedure: COLONOSCOPY WITH PROPOFOL;  Surgeon: Lucilla Lame, MD;  Location: [place];  Service:  Endoscopy;  Laterality: N/A;   ESOPHAGOGASTRODUODENOSCOPY (EGD) WITH PROPOFOL N/A 10/22/2017   Procedure: ESOPHAGOGASTRODUODENOSCOPY (EGD) WITH PROPOFOL;  Surgeon: Lucilla Lame, MD;  Location: [place];  Service: Endoscopy;  Laterality: N/A;   FUSION OF TALONAVICULAR JOINT Right 08/03/2015   Procedure: TAILOR NAVICULAR JOINT FUSION - RIGHT ;  Surgeon: [name], DPM;  Location: [facility];  Service: Podiatry;  Laterality: Right;   JOINT REPLACEMENT Right    knee x 3 ,right x1 and left x2   LOOP RECORDER IMPLANT  08/22/2021   Procedure: LOOP RECORDER IMPLANT; Location: [place]; Surgeon: Virl Axe, MD   POLYPECTOMY N/A 10/22/2017   Procedure: POLYPECTOMY;  Surgeon: Lucilla Lame, MD;  Location: [place];  Service: Endoscopy;  Laterality: N/A;   ROTATOR CUFF REPAIR Right 1995   TONSILLECTOMY      TOTAL KNEE REVISION Right 01/20/2018   Procedure: POLYETHYLENE EXCHANGE;  Surgeon: Dereck Leep, MD;  Location: [facility];  Service: Orthopedics;  Laterality: Right;   TUBAL LIGATION     UPPER GI ENDOSCOPY  2000?   Family History  Problem Relation Age of Onset   Heart failure Mother    Gout Mother    Arthritis Mother    Hypertension Mother    Stroke Mother    Heart failure Father    Diabetes Father    Hyperlipidemia Father    COPD Sister    Cancer Maternal Aunt        breast   Cancer Maternal Uncle        kidney   Breast cancer Neg Hx    Social History   Tobacco Use   Smoking status: Former    Packs/day: 2.00    Years: 20.00    Total pack years: 40.00    Types: Cigarettes    Quit date: 02/24/1993    Years since quitting: 28.6   Smokeless tobacco: Never  Vaping Use   Vaping Use: Never used  Substance Use Topics   Alcohol use: No    Alcohol/week: 0.0 standard drinks of alcohol   Drug use: No    Pertinent Clinical Results:  LABS: Labs reviewed: Acceptable for surgery.  No visits with results within 3 Day(s) from this visit.  Latest known visit with results is:  Hospital Outpatient Visit on 09/27/2021  Component Date Value Ref Range Status   ABO/RH(D) 09/27/2021 O POS   Final   Antibody Screen 09/27/2021 NEG   Final   Sample Expiration 09/27/2021 10/11/2021,2359   Final   Extend sample reason 09/27/2021    Final  Value:NO TRANSFUSIONS OR PREGNANCY IN THE PAST 3 MONTHS Performed at [facility], [address] [place], [place] [address]   MRSA, PCR 09/27/2021 NEGATIVE  NEGATIVE Final   Staphylococcus aureus 09/27/2021 NEGATIVE  NEGATIVE Final   Comment: (NOTE) The Xpert SA Assay (FDA approved for NASAL specimens in patients 66 years of age and older), is one component of a comprehensive surveillance program. It is not intended to diagnose infection nor to guide or monitor treatment. Performed at [facility], [place],  [place] [address]   WBC 09/27/2021 6.0  4.0 - 10.5 K/uL Final   RBC 09/27/2021 2.83 (L)  3.87 - 5.11 MIL/uL Final   Hemoglobin 09/27/2021 8.4 (L)  12.0 - 15.0 g/dL Final   HCT 09/27/2021 26.2 (L)  36.0 - 46.0 % Final   MCV 09/27/2021 92.6  80.0 - 100.0 fL Final   MCH 09/27/2021 29.7  26.0 - 34.0  pg Final   MCHC 09/27/2021 32.1  30.0 - 36.0 g/dL Final   RDW 09/27/2021 15.4  11.5 - 15.5 % Final   Platelets 09/27/2021 182  150 - 400 K/uL Final   nRBC 09/27/2021 0.0  0.0 - 0.2 % Final   Performed at [facility], [place], [place] [address]   Sodium 09/27/2021 139  135 - 145 mmol/L Final   Potassium 09/27/2021 3.5  3.5 - 5.1 mmol/L Final   Chloride 09/27/2021 102  98 - 111 mmol/L Final   CO2 09/27/2021 28  22 - 32 mmol/L Final   Glucose, Bld 09/27/2021 94  70 - 99 mg/dL Final   Glucose reference range applies only to samples taken after fasting for at least 8 hours.   BUN 09/27/2021 40 (H)  8 - 23 mg/dL Final   Creatinine, Ser 09/27/2021 2.24 (H)  0.44 - 1.00 mg/dL Final   Calcium 09/27/2021 10.1  8.9 - 10.3 mg/dL Final   Total Protein 09/27/2021 7.3  6.5 - 8.1 g/dL Final   Albumin 09/27/2021 4.2  3.5 - 5.0 g/dL Final   AST 09/27/2021 23  15 - 41 U/L Final   ALT 09/27/2021 13  0 - 44 U/L Final   Alkaline Phosphatase 09/27/2021 74  38 - 126 U/L Final   Total Bilirubin 09/27/2021 0.6  0.3 - 1.2 mg/dL Final   GFR, Estimated 09/27/2021 23 (L)  >60 mL/min Final   Comment: (NOTE) Calculated using the CKD-EPI Creatinine Equation (2021)    Anion gap 09/27/2021 9  5 - 15 Final   Performed at [facility], [place], [place] [address]   CRP 09/27/2021 1.6 (H)  <1.0 mg/dL Final   Performed at [facility], [place] [address] [place], [place] [address]   Sed Rate 09/27/2021 79 (H)  0 - 30 mm/hr Final   Performed at [facility], [place], [place] [address]    ECG: Date: 08/22/2021 Time ECG obtained: 1137 AM Rate: 76  bpm Rhythm:  Sinus rhythm with PACs and occasional PVCs Axis (leads I and aVF): Normal Intervals: PR 160 ms. QRS 74 ms. QTc 427 ms. ST segment and T wave changes: No evidence of acute ST segment elevation or depression Comparison: Similar to previous tracing obtained on 06/13/2021.  Atrial and ventricular ectopy now noted.   IMAGING / PROCEDURES: DIAGNOSTIC RADIOGRAPHS OF LEFT KNEE 3 views performed on 07/19/2021 Good position of the total knee implants.  Good alignment is noted on the AP view.  Good cement mantle is appreciated without evidence of loosening.  No evidence of polyethylene wear or osteolysis.  No evidence of fracture or dislocation.   DIAGNOSTIC RADIOGRAPHS OF RIGHT KNEE 1 TO 2 VIEWS performed on 07/19/2021 Good position of the total knee implants.  Good alignment is noted on the AP view.  Good cement mantle is appreciated without evidence of loosening.  No evidence of polyethylene wear or osteolysis.  No evidence of fracture or dislocation.   CT ANGIO CHEST PE W AND/OR WO CONTRAST performed on 06/04/2021 No CT findings for pulmonary embolism Normal caliber thoracic aorta Stable appearing mosaic pattern groundglass attenuation most likely due to small airways disease such as asthma or respiratory bronchiolitis. Other possibilities would include hypersensitivity pneumonitis and cryptogenic organizing pneumonia.   No focal pulmonary infiltrates or some pulmonary lesions. Aortic atherosclerosis  LONG TERM CARDIAC EVENT MONITOR STUDY performed on 07/17/2021 Predominant underlying sinus rhythm with an average  heart rate of 65 bpm (range 49-197 bpm). 6 episodes of SVT with the fastest lasting 5 beats at a maximum rate of 197 bpm, and the longest lasting 17 beats with an average rate of 140 bpm. Isolated SVE's were occasional (2.3%, 928)  SVE couplets were rare (<1.0%, 68) SVE triplets were rare (<1.0%, 15) Isolated VE's and VE couplets were rare (<1.0%).  No VE  triplets. Ventricular bigeminy and trigeminy were present Failed monitoring in terms of duration (0 days and 13 hours) No atrial fibrillation detected  TRANSTHORACIC ECHOCARDIOGRAM performed on 10/01/2018 The left ventricle has normal systolic function, with an ejection fraction of 55-60%. The cavity size was normal. There is mild concentric left ventricular hypertrophy. Left ventricular diastolic Doppler parameters are consistent with impaired relaxation.  The right ventricle has normal systolic function. The cavity was normal. There is no increase in right ventricular wall thickness. Right ventricular systolic pressure could not be assessed.  Left atrial size was mildly dilated.  The mitral valve is grossly normal.  The tricuspid valve is grossly normal.  The aortic valve is grossly normal. No stenosis of the aortic valve.  The aorta is normal in size and structure.  PVCs noted during the study.   MYOCARDIAL PERFUSION IMAGING STUDY (LEXISCAN) performed on 10/01/2018 Normal left ventricular systolic function with an EF of 55-65% No evidence of stress-induced myocardial ischemia or arrhythmia; no scintigraphic evidence of scar Normal low risk study  Impression and Plan:  Michele Moore has been referred for pre-anesthesia review and clearance prior to her undergoing the planned anesthetic and procedural courses. Available labs, pertinent testing, and imaging results were personally reviewed by me. This patient has been appropriately cleared by cardiology with an overall ACCEPTABLE risk of significant perioperative cardiovascular complications.  Based on clinical review performed today (10/07/21), barring any significant acute changes in the patient's overall condition, it is anticipated that she will be able to proceed with the planned surgical intervention. Any acute changes in clinical condition may necessitate her procedure being postponed and/or cancelled. Patient will meet with anesthesia  team (MD and/or CRNA) on the day of her procedure for preoperative evaluation/assessment. Questions regarding anesthetic course will be fielded at that time.   Pre-surgical instructions were reviewed with the patient during her PAT appointment and questions were fielded by PAT clinical staff. Patient was advised that if any questions or concerns arise prior to her procedure then she should return a call to PAT and/or her surgeon's office to discuss.  Honor Loh, MSN, APRN, FNP-C, CEN [facility]  Peri-operative Services Nurse Practitioner Phone: [phone] Fax: [phone] 10/07/21 3:53 PM  NOTE: This note has been prepared using Dragon dictation software. Despite my best ability to proofread, there is always the potential that unintentional transcriptional errors may still occur from this process.

## 2021-10-01 ENCOUNTER — Telehealth: Payer: Self-pay

## 2021-10-01 NOTE — Telephone Encounter (Signed)
Primary Cardiologist:None   Preoperative team, please contact this patient and set up a phone call appointment for further preoperative risk assessment. Please obtain consent and complete medication review. Thank you for your help.   I confirm that guidance regarding antiplatelet and oral anticoagulation therapy has been completed and, if necessary, noted below (none requested).   Emmaline Life, NP-C    10/01/2021, 8:14 AM [facility] [address], [address] Office [phone] Fax [phone]

## 2021-10-01 NOTE — Telephone Encounter (Signed)
I left a message for the patient to return my call to set up a tele visit for clearance.

## 2021-10-01 NOTE — Telephone Encounter (Signed)
1. What type of surgery is being performed?  LEFT TOTAL KNEE REVISION WITH POLYETHYLENE EXCHANGE   2. When is this surgery scheduled?  10/09/2021     3. Type of clearance being requested (medical, pharmacy, both).  MEDICAL     4. Are there any medications that need to be held prior to surgery?  NONE   5. Practice name and name of physician performing surgery?  Performing surgeon: Dr. Skip Estimable, MD  Requesting clearance: Honor Loh, FNP-C       6. Anesthesia type (none, local, MAC, general)?  GENERAL   7. What is the office phone and fax number?    Phone: [phone]  Fax: [phone]

## 2021-10-02 ENCOUNTER — Telehealth: Payer: Self-pay | Admitting: *Deleted

## 2021-10-02 NOTE — Telephone Encounter (Signed)
Pt agreeable to tele appt 10/07/21 @ 2:40. . Med rec and consent are done.      Patient Consent for Virtual Visit        Michele Moore has provided verbal consent on 10/02/2021 for a virtual visit (video or telephone).   CONSENT FOR VIRTUAL VISIT FOR:  Michele Moore  By participating in this virtual visit I agree to the following:  I hereby voluntarily request, consent and authorize [facility] and its employed or contracted physicians, physician assistants, nurse practitioners or other licensed health care professionals (the Practitioner), to provide me with telemedicine health care services (the "Services") as deemed necessary by the treating Practitioner. I acknowledge and consent to receive the Services by the Practitioner via telemedicine. I understand that the telemedicine visit will involve communicating with the Practitioner through live audiovisual communication technology and the disclosure of certain medical information by electronic transmission. I acknowledge that I have been given the opportunity to request an in-person assessment or other available alternative prior to the telemedicine visit and am voluntarily participating in the telemedicine visit.  I understand that I have the right to withhold or withdraw my consent to the use of telemedicine in the course of my care at any time, without affecting my right to future care or treatment, and that the Practitioner or I may terminate the telemedicine visit at any time. I understand that I have the right to inspect all information obtained and/or recorded in the course of the telemedicine visit and may receive copies of available information for a reasonable fee.  I understand that some of the potential risks of receiving the Services via telemedicine include:  Delay or interruption in medical evaluation due to technological equipment failure or disruption; Information transmitted may not be sufficient (e.g. poor resolution of images)  to allow for appropriate medical decision making by the Practitioner; and/or  In rare instances, security protocols could fail, causing a breach of personal health information.  Furthermore, I acknowledge that it is my responsibility to provide information about my medical history, conditions and care that is complete and accurate to the best of my ability. I acknowledge that Practitioner's advice, recommendations, and/or decision may be based on factors not within their control, such as incomplete or inaccurate data provided by me or distortions of diagnostic images or specimens that may result from electronic transmissions. I understand that the practice of medicine is not an exact science and that Practitioner makes no warranties or guarantees regarding treatment outcomes. I acknowledge that a copy of this consent can be made available to me via my patient portal ([facility]), or I can request a printed copy by calling the office of [facility].    I understand that my insurance will be billed for this visit.   I have read or had this consent read to me. I understand the contents of this consent, which adequately explains the benefits and risks of the Services being provided via telemedicine.  I have been provided ample opportunity to ask questions regarding this consent and the Services and have had my questions answered to my satisfaction. I give my informed consent for the services to be provided through the use of telemedicine in my medical care

## 2021-10-02 NOTE — Telephone Encounter (Signed)
Pt agreeable to tele appt 10/07/21 @ 2:40. . Med rec and consent are done.

## 2021-10-06 NOTE — H&P (Signed)
ORTHOPAEDIC HISTORY & PHYSICAL Gwenlyn Fudge, [place] - 09/27/2021 3:30 PM EDT Formatting of this note is different from the original. [facility] Chief Complaint:   Chief Complaint  Patient presents with  Knee Pain  H & P LEFT KNEE   History of Present Illness:   Michele Moore is a 71 y.o. female that presents to clinic today for her preoperative history and evaluation. Patient presents unaccompanied. The patient is scheduled to undergo a revision left total knee arthroplasty on 10/09/21 by Dr. Marry Guan. Patient has a complicated left knee history. Originally underwent a left total knee arthroplasty on 07/10/2009 with Dr. Mauri Pole. Patient later developed pain and instability and underwent a polyethylene exchange on 01/02/2011. Had some improvement of her symptoms but subsequently has had progressive knee pain. 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 Confirms swelling with some giving way of the knee. Denies locking.  The patient's symptoms have progressed to the point that they decrease her quality of life. The patient has previously undergone polyethylene exchange in 2012 and more recently geniculate nerve blocks without improvement in her symptoms.  Patient does seeing Dr. Virl Axe in cardiology for atrial fibrillation.   Patient states her grandson will be staying with her but he works third shift.  Patient reports an anaphylactic allergy to penicillin.   Patient denies history of lumbar surgery, history of DVT. Of note, patient does have history of psoriatic arthritis.  Past Medical, Surgical, Family, Social History, Allergies, Medications:   Past Medical History:  Past Medical History:  Diagnosis Date  Bleeding from the nose  Cervical facet syndrome 06/16/2017  Chronic gouty arthropathy without tophi 06/25/2017  Chronic kidney disease  Essential hypertension 12/28/2013  GERD (gastroesophageal  reflux disease)  Hiatal hernia  Hyperlipemia  Hypertension  Lumbar degenerative disc disease 04/21/2017  Lumbar radiculopathy 04/21/2017  Migraines  Migraines  Osteoarthritis  Osteoporosis  Pinched thoracic nerve root  Rheumatoid arthritis(714.0) (CMS-HCC)  Sprain of anterior talofibular ligament of right ankle 06/16/2017  Thyroid disease  Ulcer   Past Surgical History:  Past Surgical History:  Procedure Laterality Date  TONSILLECTOMY 09/13/2003  Cemented right total knee replacement using computer-assisted navigation 01/03/2008  Dr Mauri Pole  Cemented left total knee replacement using computer-assisted navigation 07/10/2009  Dr Mauri Pole  Revision of tibial component, left total knee replacement with change of polyethylene insert 01/02/2011  Dr Mauri Pole  Right total knee arthroplasty polyethylene exchange 01/20/2018  Dr Marry Guan  Carpal tunnel releases x2 on right and x1 on left  Right shoulder rotator cuff repair  Subtalar and talonavicular joint fusions right foot 02/02/2012  TUBAL LIGATION   Current Medications:  Current Outpatient Medications  Medication Sig Dispense Refill  acetaminophen (TYLENOL ARTHRITIS PAIN) 650 MG ER tablet Take 1,300 mg by mouth every 8 (eight) hours as needed for Pain  acetaminophen (TYLENOL) 500 MG tablet Take 1,000 mg by mouth every 6 (six) hours as needed for Pain  allopurinoL (ZYLOPRIM) 100 MG tablet Take 2 tablets (200 mg total) by mouth once daily 180 tablet 1  apremilast (OTEZLA) 30 mg tablet Take 1 tablet by mouth as needed  betamethasone valerate (VALISONE) 0.1 % cream APPLY TOPICALLY TO THE AFFECTED AREA TWICE DAILY AS NEEDED FOR RASH  calcipotriene (DOVONEX) 0.005 % cream Apply topically 2 (two) times daily  cholecalciferol (VITAMIN D3) 2,000 unit capsule Take 2,000 Units by mouth once daily  clobetasoL (TEMOVATE) 0.05 % ointment Apply topically 2 (two) times daily  dapagliflozin propanediol (FARXIGA) 10 mg tablet Take 10 mg by mouth every  morning  folic acid (FOLVITE) 1 MG tablet 1 tab daily, 90 days 90 tablet 4  FUROsemide (LASIX) 40 MG tablet Take 40 mg by mouth once daily  gabapentin (NEURONTIN) 600 MG tablet  glycerin (ORAJEL DRY MOUTH MM) by Mucous Membrane route as needed  hydrALAZINE (APRESOLINE) 50 MG tablet Take 50 mg by mouth 2 (two) times daily  HYDROcodone-acetaminophen (NORCO) 7.5-325 mg tablet Take 1 tablet by mouth every 8 (eight) hours as needed for Pain  ketoconazole (NIZORAL) 2 % cream Apply topically once daily as needed  levothyroxine (SYNTHROID) 175 MCG tablet Take 175 mcg by mouth once daily  meclizine (ANTIVERT) 12.5 mg tablet Take 12.5 mg by mouth 3 (three) times daily as needed  methotrexate (RHEUMATREX) 2.5 MG tablet Take 3 tabs weekly (7.5 mg ) weekly, 12 weeks 36 tablet 1  metoprolol tartrate (LOPRESSOR) 25 MG tablet Take 25 mg by mouth 2 (two) times daily  montelukast (SINGULAIR) 10 mg tablet Take 10 mg by mouth once daily as needed  multivitamin capsule Take 1 capsule by mouth once daily  nystatin (MYCOSTATIN) 100,000 unit/gram cream Apply topically 2 (two) times daily  omega-3 acid ethyl esters (LOVAZA) 1 gram capsule 1 g nightly 5  omeprazole (PRILOSEC) 40 MG DR capsule TK ONE C PO QD 2  potassium chloride (K-DUR,KLOR-CON) 20 MEQ ER tablet Take 10 mEq by mouth once daily 3  propylene glycoL (SYSTANE BALANCE) 0.6 % ophthalmic drops One drop in each twice a day ,for dry eyes 20 mL 11  sodium chloride (SALINE NASAL MIST NASAL) by Nasal route once daily as needed  tiZANidine (ZANAFLEX) 4 MG tablet Take 4 mg by mouth at bedtime  triamcinolone 0.1 % cream Apply 1 Application topically 2 (two) times daily as needed 1  triamterene-hydrochlorothiazide (MAXZIDE) 75-50 mg tablet Take 1 tablet by mouth once daily. [facility] Name: CPAP machine  albuterol 90 mcg/actuation inhaler Inhale 2 inhalations into the lungs every 4 (four) hours as needed   No current facility-administered medications for  this visit.   Allergies:  Allergies  Allergen Reactions  Ampicillin Angioedema  Penicillins Anaphylaxis  Vancomycin Rash     Social History:  Social History   Socioeconomic History  Marital status: Divorced  Number of children: 4  Years of education: 35  Occupational History  Occupation: Disabled-Retired  Tobacco Use  Smoking status: Former  Packs/day: 2.00  Years: 25.00  Pack years: 50.00  Types: Cigarettes  Quit date: 02/24/1978  Years since quitting: 43.6  Smokeless tobacco: Never  Tobacco comments:  Quit in 1992  Vaping Use  Vaping Use: Never used  Substance and Sexual Activity  Alcohol use: No  Drug use: No  Sexual activity: Not Currently  Partners: Male   Family History:  Family History  Problem Relation Age of Onset  Stroke Mother  Glaucoma Mother  Heart disease Mother  High blood pressure (Hypertension) Mother  Gout Mother  Dementia Mother  Diabetes type II Father  Myocardial Infarction (Heart attack) Father  High blood pressure (Hypertension) Father  Fibroids Daughter  Kidney disease Sister  High blood pressure (Hypertension) Sister   Review of Systems:   A 10+ ROS was performed, reviewed, and the pertinent orthopaedic findings are documented in the HPI.   Physical Examination:   BP 120/82 (BP Location: Left upper arm, Patient Position: Sitting, BP Cuff Size: Large Adult)  Ht 175.3 cm ('5\' 9"'$ )  Wt Marland Kitchen)  119 kg (262 lb 6.4 oz)  LMP (LMP Unknown)  BMI 38.75 kg/m   Patient is a well-developed, well-nourished female in no acute distress. Patient has normal mood and affect. Patient is alert and oriented to person, place, and time.   HEENT: Atraumatic, normocephalic. Pupils equal and reactive to light. Extraocular motion intact. Noninjected sclera.  Cardiovascular: Irregular rate and rhythm, with no murmurs, rubs, or gallops. Distal pulses palpable.  Respiratory: Lungs clear to auscultation bilaterally.   Left Knee: Skin is clean and dry.  Well-healed surgical incision. Soft tissue swelling: mild Effusion: none Erythema: none Crepitance: none Tenderness: anterior Alignment: normal Mediolateral laxity: stable in full extension. There is some mid flexion instability. Posterior sag: negative Patellar tracking: Good tracking without evidence of subluxation or tilt Atrophy: Generalized quadriceps atrophy.  Quadriceps tone was fair. Range of motion: 0/3/110 degrees  Sensation intact over the saphenous, lateral sural cutaneous, superficial fibular, and deep fibular nerve distributions.  Tests Performed/Reviewed:  X-rays  No new x-rays. Previous x-rays reveal left total knee arthroplasty without evidence of loosening, ration, or failure. No periprosthetic lucency or fracture.  Impression:   ICD-10-CM  1. Status post revision of total knee, left B71.696   Plan:   -Status post revision left total knee arthroplasty -Mid flexion instability The patient has evidence of mid flexion instability of her left total knee arthroplasty. It was explained to the patient that the condition is progressive in nature. Having failed conservative treatment, the patient has elected to proceed with a revision total joint arthroplasty. The patient will undergo a revision total joint arthroplasty with Dr. Marry Guan. The risks of surgery, including blood clot and infection, were discussed with the patient. Measures to reduce these risks, including the use of anticoagulation, perioperative antibiotics, and early ambulation were discussed. The importance of postoperative physical therapy was discussed with the patient. The patient elects to proceed with surgery. The patient is instructed to stop all blood thinners prior to surgery. The patient is instructed to call the hospital the day before surgery to learn of the proper arrival time.   Contact our office with any questions or concerns. Follow up as indicated, or sooner should any new problems arise, if  conditions worsen, or if they are otherwise concerned.   Gwenlyn Fudge, [facility] [place], [place] [address] Phone: [phone]  This note was generated in part with voice recognition software and I apologize for any typographical errors that were not detected and corrected.  Electronically signed by Gwenlyn Fudge, PA at 09/27/2021 4:49 PM EDT

## 2021-10-07 ENCOUNTER — Ambulatory Visit (INDEPENDENT_AMBULATORY_CARE_PROVIDER_SITE_OTHER): Payer: Medicare Other | Admitting: Student

## 2021-10-07 DIAGNOSIS — Z01810 Encounter for preprocedural cardiovascular examination: Secondary | ICD-10-CM | POA: Diagnosis not present

## 2021-10-07 NOTE — Progress Notes (Signed)
Virtual Visit via Telephone Note   Because of Michele Moore's co-morbid illnesses, she is at least at moderate risk for complications without adequate follow up.  This format is felt to be most appropriate for this patient at this time.  The patient did not have access to video technology/had technical difficulties with video requiring transitioning to audio format only (telephone).  All issues noted in this document were discussed and addressed.  No physical exam could be performed with this format.  Please refer to the patient's chart for her consent to telehealth for [facility].  Evaluation Performed:  Preoperative Cardiovascular Risk Assessment _____________   Date:  10/07/2021   Patient ID:  Michele Moore, DOB [birth year], MRN [id number] Patient Location:  Home Provider location:   Office  Primary Care Provider:  Juline Patch, MD Primary Cardiologist: Dr. Caryl Comes  Chief Complaint / Patient Profile   Michele Moore is a 71 y.o. year old female with a history of palpitations with concern for atrial fibrillation s/p placement of loop recorder in 07/2021, HFpEF, hypertension, hyperlipidemia, CKD stage III, GERD, anemia, rheumatic arthritis, psoriasis, and chronic pain who is pending a left total knee revision with polyethylene exchange and presents today for telephonic preoperative cardiovascular risk assessment.  Past Medical History    Past Medical History:  Diagnosis Date   (HFpEF) heart failure with preserved ejection fraction ([place]) 01/13/2014   a.) TTE 01/13/2014: EF 45%, apical HK, mod LVH, mild MAC, mild LAE/RVE, mod MR/TR/PR; b.) TTE 10/01/2018: EF 55-60%, mild MVH, mild LAE, triv MR/TR, G1DD   Allergy    ANA positive    Anemia of chronic renal failure, stage 3 (moderate), unspecified whether stage 3a or 3b CKD (HCC)    Aortic atherosclerosis (HCC)    Asthma    Cataract    Chronic pain syndrome    a.) followed by [facility] Holley Raring, MD)   Chronic, continuous  use of opioids    CKD (chronic kidney disease), stage III ([place])    Complication of anesthesia    a.) anesthesia awareness/premature emergence during colonoscopy   DDD (degenerative disc disease), lumbar    Dyspnea on exertion    Dysrhythmia    IRREGULAR HEART BEAT   Family history of adverse reaction to anesthesia    sister difficult to put to sleep   GERD (gastroesophageal reflux disease)    Glaucoma    Gout    H/O tooth extraction    all lower teeth 1/19   Hemorrhoid    History of hiatal hernia    History of kidney stones    Hypertension    Hypothyroidism    Implantable loop recorder present 08/22/2021   a.) s/p implantation of [employer] LINQ Reveal ILR; serial #: [id number]   Long term current use of immunosuppressive drug    a.) MTX + apremilast   Migraines    Mixed hyperlipidemia    Multiple gastric ulcers    Osteoarthritis    Psoriasis    Psoriatic arthritis ([place])    a.) on apremilast   Rheumatoid arthritis ([place])    a.) on MTX   SS-A antibody positive    Wears dentures    partial upper and lower   Past Surgical History:  Procedure Laterality Date   ANKLE SURGERY Right    CARPAL TUNNEL RELEASE     x3   COLONOSCOPY  2000?   COLONOSCOPY WITH PROPOFOL N/A 10/22/2017   Procedure: COLONOSCOPY WITH PROPOFOL;  Surgeon: Lucilla Lame,  MD;  Location: [place];  Service: Endoscopy;  Laterality: N/A;   ESOPHAGOGASTRODUODENOSCOPY (EGD) WITH PROPOFOL N/A 10/22/2017   Procedure: ESOPHAGOGASTRODUODENOSCOPY (EGD) WITH PROPOFOL;  Surgeon: Lucilla Lame, MD;  Location: [place];  Service: Endoscopy;  Laterality: N/A;   FUSION OF TALONAVICULAR JOINT Right 08/03/2015   Procedure: TAILOR NAVICULAR JOINT FUSION - RIGHT ;  Surgeon: [name], DPM;  Location: [facility];  Service: Podiatry;  Laterality: Right;   JOINT REPLACEMENT Right    knee x 3 ,right x1 and left x2   LOOP RECORDER IMPLANT  08/22/2021   Procedure: LOOP RECORDER IMPLANT; Location: [place];  Surgeon: Virl Axe, MD   POLYPECTOMY N/A 10/22/2017   Procedure: POLYPECTOMY;  Surgeon: Lucilla Lame, MD;  Location: [place];  Service: Endoscopy;  Laterality: N/A;   ROTATOR CUFF REPAIR Right 1995   TONSILLECTOMY     TOTAL KNEE REVISION Right 01/20/2018   Procedure: POLYETHYLENE EXCHANGE;  Surgeon: Dereck Leep, MD;  Location: [facility];  Service: Orthopedics;  Laterality: Right;   TUBAL LIGATION     UPPER GI ENDOSCOPY  2000?    Allergies  Allergies  Allergen Reactions   Ampicillin Swelling   Penicillins Anaphylaxis and Swelling    IgE = 10 (11/05/2017)  Has patient had a PCN reaction causing immediate rash, facial/tongue/throat swelling, SOB or lightheadedness with hypotension: Yes Has patient had a PCN reaction causing severe rash involving mucus membranes or skin necrosis: No Has patient had a PCN reaction that required hospitalization: No Has patient had a PCN reaction occurring within the last 10 years: Yes If all of the above answers are "NO", then may proceed with Cephalosporin use.   Vancomycin Rash    Other reaction(s): Other (see comments)   Vibramycin [Doxycycline Calcium] Rash    History of Present Illness    Michele Moore is a 71 y.o. female who presents via audio/video conferencing for a telehealth visit today.  Patient was last seen in our office on 08/22/2021 by Dr. Caryl Comes.  At that time, she reported occasional flutters and chronic dyspnea but no chest pain. Implantable loop recorder was placed given presumed diagnosis of atrial fibrillation with no documentation of this and a high CHA2D2-VASc score.  She is now pending procedure as outlined above. Since her last visit, she has been doing well from a cardiac standpoint. She reports some chest discomfort from indigestion. This occurs after she eats foods that she knows she should (like spicy or greasy foods). She states she has had reflux for decades and it is stable. No exertional chest pain. This does  not sound like cardiac chest pain. She denies any chest pain, shortness of breath, orthopnea, or PND. She has chronic lower extremity edema but this is well controlled on Lasix. She states her palpitations are stable and well controlled. No lightheadedness/dizziness or syncope. She is able to complete >4.0 METS without any chest pain or shortness of breath.  Home Medications    Prior to Admission medications   Medication Sig Start Date End Date Taking? Authorizing Provider  acetaminophen (TYLENOL) 500 MG tablet Take 500 mg by mouth every 6 (six) hours as needed.    [provider]  acetaminophen (TYLENOL) 650 MG CR tablet Take 650 mg by mouth every 8 (eight) hours as needed for pain.    [provider]  albuterol (VENTOLIN HFA) 108 (90 Base) MCG/ACT inhaler Inhale 2 puffs into the lungs every 6 (six) hours as needed for wheezing or shortness of  breath. 06/04/21   Nance Pear, MD  allopurinol (ZYLOPRIM) 100 MG tablet Take 200 mg by mouth every morning.  08/06/17   [provider]  betamethasone dipropionate 0.05 % cream APPLY TO PSORIASIS ON HANDS TWICE DAILY ONLY. AVOID FACE, [place] AXILLA Patient not taking: Reported on 09/27/2021 05/30/19   Ralene Bathe, MD  betamethasone valerate (VALISONE) 0.1 % cream Apply topically 2 (two) times daily as needed (Rash). 03/15/20   Ralene Bathe, MD  calcipotriene (DOVONOX) 0.005 % cream APPLY TOPICALLY TO PSORIASIS AREAS ON LEGS AND FEET ONCE OR TWICE DAILY 03/28/21   Ralene Bathe, MD  Cholecalciferol (VITAMIN D3) 2000 units TABS Take 2,000 Units by mouth daily.    [provider]  clobetasol ointment (TEMOVATE) 0.05 % Apply topically 2 (two) times daily. 05/28/21   [provider]  diclofenac sodium (VOLTAREN) 1 % GEL Apply 2 g topically 3 (three) times daily.  08/11/17   [provider]  FARXIGA 10 MG TABS tablet Take 10 mg by mouth every morning. 07/15/21   [provider]  ferrous  sulfate 325 (65 FE) MG tablet Take 325 mg by mouth daily with breakfast.  Patient not taking: Reported on 09/27/2021    [provider]  furosemide (LASIX) 40 MG tablet Take 40 mg by mouth daily. 07/15/21   [provider]  gabapentin (NEURONTIN) 600 MG tablet Take 1 tablet (600 mg total) by mouth at bedtime. 08/06/21 02/02/22  Gillis Santa, MD  hydrALAZINE (APRESOLINE) 50 MG tablet Take 1 tablet by mouth twice daily 09/16/21   Juline Patch, MD  HYDROcodone-acetaminophen (NORCO) 7.5-325 MG tablet Take 1 tablet by mouth every 8 (eight) hours as needed for severe pain. Must last 30 days. Patient not taking: Reported on 10/02/2021 08/23/21 10/02/21  Gillis Santa, MD  HYDROcodone-acetaminophen (NORCO) 7.5-325 MG tablet Take 1 tablet by mouth every 8 (eight) hours as needed for severe pain. Must last 30 days. Patient not taking: Reported on 10/02/2021 09/22/21 10/22/21  Gillis Santa, MD  HYDROcodone-acetaminophen (NORCO) 7.5-325 MG tablet Take 1 tablet by mouth every 8 (eight) hours as needed for severe pain. Must last 30 days. 10/22/21 11/21/21  Gillis Santa, MD  ketoconazole (NIZORAL) 2 % cream Apply topically daily as needed. 03/19/21   Juline Patch, MD  levothyroxine (SYNTHROID) 175 MCG tablet Take 1 tablet (175 mcg total) by mouth daily. 09/16/21   Juline Patch, MD  meclizine (ANTIVERT) 12.5 MG tablet TAKE 1 TABLET(12.5 MG) BY MOUTH THREE TIMES DAILY AS NEEDED FOR DIZZINESS 09/16/21   Juline Patch, MD  methotrexate (RHEUMATREX) 2.5 MG tablet Take 7.5 mg by mouth every Friday. Take 5 tablets by mouth once weekly 09/29/18   [provider]  metoprolol tartrate (LOPRESSOR) 25 MG tablet Take 1 tablet (25 mg total) by mouth 2 (two) times daily. 09/27/21 12/26/21  Deboraha Sprang, MD  montelukast (SINGULAIR) 10 MG tablet TAKE 1 TABLET(10 MG) BY MOUTH AT BEDTIME 09/16/21   Juline Patch, MD  Multiple Vitamins-Calcium (ONE-A-DAY WOMENS FORMULA PO) Take 1 tablet by mouth daily.     [provider]  mupirocin ointment (BACTROBAN) 2 % Apply 1 application topically 2 (two) times daily. 12/08/19   Juline Patch, MD  nystatin cream (MYCOSTATIN) Apply 1 Application topically 2 (two) times daily. 09/16/21   Juline Patch, MD  omega-3 acid ethyl esters (LOVAZA) 1 g capsule Take 1 capsule (1 g total) by mouth 2 (two) times daily. 03/19/21  Juline Patch, MD  omeprazole (PRILOSEC) 40 MG capsule TAKE 1 CAPSULE(40 MG) BY MOUTH DAILY 09/16/21   Juline Patch, MD  OTEZLA 30 MG TABS Take 1 tablet by mouth daily. 11/12/20   [provider]  Polyethyl Glycol-Propyl Glycol (SYSTANE OP) Place 1 drop into both eyes daily as needed (dry eyes).  Patient not taking: Reported on 09/27/2021    [provider]  potassium chloride SA (KLOR-CON M) 20 MEQ tablet Take 0.5 tablets (10 mEq total) by mouth daily. 09/16/21   Juline Patch, MD  tiZANidine (ZANAFLEX) 4 MG tablet Take 1 tablet (4 mg total) by mouth at bedtime. 08/06/21 08/01/22  Gillis Santa, MD  triamcinolone (KENALOG) 0.1 % Apply 1 application topically 2 (two) times daily. 03/22/20   Juline Patch, MD  triamterene-hydrochlorothiazide (MAXZIDE) 75-50 MG tablet Take 1 tablet by mouth daily. 09/16/21   Juline Patch, MD  fluticasone (FLONASE) 50 MCG/ACT nasal spray SHAKE LIQUID AND USE 1 SPRAY IN [facility] NOSTRIL DAILY 02/28/19 04/24/19  Juline Patch, MD  mometasone (NASONEX) 50 MCG/ACT nasal spray Place 2 sprays into the nose daily. 04/12/19 04/24/19  Juline Patch, MD    Physical Exam    Vital Signs:  Michele Moore does not have vital signs available for review today.  Given telephonic nature of communication, physical exam is limited. Alert and oriented x3. No acute distress. Normal affect. Speech and respirations are unlabored.  Accessory Clinical Findings    None  Assessment & Plan    Preoperative Cardiovascular Risk Assessment: Patient has an upcoming left total knee revision with polyethylene exchange  planned. She is stable from a cardiac standpoint with no angina, acute CHF symptoms, significant palpitations, lightheadedness/dizziness, or syncope. She is able to complete >4.0 METS without any chest pain or shortness of breath. Per Revised Cardiac Risk Index, considered moderate risk with 6.6% chance of MI, pulmonary edema, ventricular fibrillation, cardiac arrest, or complete heart block.  Therefore, based on ACC/AHA guidelines, patient would be at acceptable risk for the planned procedure without further cardiovascular testing. I will route this recommendation to the requesting party via Epic fax function.    Time:   Today, I have spent 11 minutes with the patient with telehealth technology discussing medical history, symptoms, and management plan.     Darreld Mclean, PA-C  10/07/2021, 1:25 PM

## 2021-10-08 DIAGNOSIS — L400 Psoriasis vulgaris: Secondary | ICD-10-CM | POA: Diagnosis not present

## 2021-10-08 DIAGNOSIS — L818 Other specified disorders of pigmentation: Secondary | ICD-10-CM | POA: Diagnosis not present

## 2021-10-08 DIAGNOSIS — Z79899 Other long term (current) drug therapy: Secondary | ICD-10-CM | POA: Diagnosis not present

## 2021-10-08 MED ORDER — CHLORHEXIDINE GLUCONATE 4 % EX LIQD
60.0000 mL | Freq: Once | CUTANEOUS | Status: AC
  Administered 2021-10-09: 4 via TOPICAL

## 2021-10-08 MED ORDER — GABAPENTIN 300 MG PO CAPS: 300.0000 mg | ORAL_CAPSULE | Freq: Once | ORAL | Status: AC

## 2021-10-08 MED ORDER — SODIUM CHLORIDE 0.9 % IV SOLN: INTRAVENOUS | Status: DC

## 2021-10-08 MED ORDER — CHLORHEXIDINE GLUCONATE 0.12 % MT SOLN: 15.0000 mL | Freq: Once | OROMUCOSAL | Status: AC

## 2021-10-08 MED ORDER — CEFAZOLIN IN SODIUM CHLORIDE 3-0.9 GM/100ML-% IV SOLN
3.0000 g | INTRAVENOUS | Status: AC
  Administered 2021-10-09: 3 g via INTRAVENOUS
  Filled 2021-10-08: qty 100

## 2021-10-08 MED ORDER — ORAL CARE MOUTH RINSE: 15.0000 mL | Freq: Once | OROMUCOSAL | Status: AC

## 2021-10-08 MED ORDER — DEXAMETHASONE SODIUM PHOSPHATE 10 MG/ML IJ SOLN: 8.0000 mg | Freq: Once | INTRAMUSCULAR | Status: AC

## 2021-10-08 MED ORDER — TRANEXAMIC ACID-NACL 1000-0.7 MG/100ML-% IV SOLN
1000.0000 mg | INTRAVENOUS | Status: AC
  Administered 2021-10-09: 1000 mg via INTRAVENOUS

## 2021-10-09 ENCOUNTER — Inpatient Hospital Stay: Payer: Medicare Other | Admitting: Urgent Care

## 2021-10-09 ENCOUNTER — Other Ambulatory Visit: Payer: Self-pay

## 2021-10-09 ENCOUNTER — Inpatient Hospital Stay: Payer: Medicare Other

## 2021-10-09 ENCOUNTER — Encounter
Admission: RE | Disposition: A | Discharge status: Skilled Nursing Facility | Payer: Self-pay | Source: Home / Self Care | Attending: Orthopedic Surgery

## 2021-10-09 ENCOUNTER — Inpatient Hospital Stay
Admission: RE | Admit: 2021-10-09 | Discharge: 2021-10-14 | DRG: 488 | Disposition: A | Discharge status: Skilled Nursing Facility | Payer: Medicare Other | Attending: Orthopedic Surgery | Admitting: Orthopedic Surgery

## 2021-10-09 ENCOUNTER — Encounter: Payer: Self-pay | Admitting: Orthopedic Surgery

## 2021-10-09 DIAGNOSIS — M810 Age-related osteoporosis without current pathological fracture: Secondary | ICD-10-CM | POA: Diagnosis present

## 2021-10-09 DIAGNOSIS — T84023A Instability of internal left knee prosthesis, initial encounter: Secondary | ICD-10-CM | POA: Diagnosis present

## 2021-10-09 DIAGNOSIS — Z471 Aftercare following joint replacement surgery: Secondary | ICD-10-CM | POA: Diagnosis not present

## 2021-10-09 DIAGNOSIS — R1311 Dysphagia, oral phase: Secondary | ICD-10-CM | POA: Diagnosis not present

## 2021-10-09 DIAGNOSIS — Z9181 History of falling: Secondary | ICD-10-CM | POA: Diagnosis not present

## 2021-10-09 DIAGNOSIS — L4050 Arthropathic psoriasis, unspecified: Secondary | ICD-10-CM

## 2021-10-09 DIAGNOSIS — E039 Hypothyroidism, unspecified: Secondary | ICD-10-CM | POA: Diagnosis present

## 2021-10-09 DIAGNOSIS — M47816 Spondylosis without myelopathy or radiculopathy, lumbar region: Secondary | ICD-10-CM | POA: Diagnosis not present

## 2021-10-09 DIAGNOSIS — N1832 Chronic kidney disease, stage 3b: Secondary | ICD-10-CM | POA: Diagnosis present

## 2021-10-09 DIAGNOSIS — E876 Hypokalemia: Secondary | ICD-10-CM | POA: Diagnosis not present

## 2021-10-09 DIAGNOSIS — Z79899 Other long term (current) drug therapy: Secondary | ICD-10-CM | POA: Diagnosis not present

## 2021-10-09 DIAGNOSIS — Z841 Family history of disorders of kidney and ureter: Secondary | ICD-10-CM

## 2021-10-09 DIAGNOSIS — M62838 Other muscle spasm: Secondary | ICD-10-CM | POA: Diagnosis not present

## 2021-10-09 DIAGNOSIS — I5032 Chronic diastolic (congestive) heart failure: Secondary | ICD-10-CM | POA: Diagnosis present

## 2021-10-09 DIAGNOSIS — Z881 Allergy status to other antibiotic agents status: Secondary | ICD-10-CM

## 2021-10-09 DIAGNOSIS — Z888 Allergy status to other drugs, medicaments and biological substances status: Secondary | ICD-10-CM

## 2021-10-09 DIAGNOSIS — R809 Proteinuria, unspecified: Secondary | ICD-10-CM

## 2021-10-09 DIAGNOSIS — R52 Pain, unspecified: Secondary | ICD-10-CM | POA: Diagnosis not present

## 2021-10-09 DIAGNOSIS — M109 Gout, unspecified: Secondary | ICD-10-CM | POA: Diagnosis present

## 2021-10-09 DIAGNOSIS — R2689 Other abnormalities of gait and mobility: Secondary | ICD-10-CM | POA: Diagnosis not present

## 2021-10-09 DIAGNOSIS — I959 Hypotension, unspecified: Secondary | ICD-10-CM | POA: Diagnosis not present

## 2021-10-09 DIAGNOSIS — M069 Rheumatoid arthritis, unspecified: Secondary | ICD-10-CM | POA: Diagnosis present

## 2021-10-09 DIAGNOSIS — D62 Acute posthemorrhagic anemia: Secondary | ICD-10-CM | POA: Diagnosis not present

## 2021-10-09 DIAGNOSIS — G894 Chronic pain syndrome: Secondary | ICD-10-CM | POA: Diagnosis present

## 2021-10-09 DIAGNOSIS — Z79631 Long term (current) use of antimetabolite agent: Secondary | ICD-10-CM | POA: Diagnosis not present

## 2021-10-09 DIAGNOSIS — Z880 Allergy status to penicillin: Secondary | ICD-10-CM | POA: Diagnosis not present

## 2021-10-09 DIAGNOSIS — I4891 Unspecified atrial fibrillation: Secondary | ICD-10-CM | POA: Diagnosis present

## 2021-10-09 DIAGNOSIS — E669 Obesity, unspecified: Secondary | ICD-10-CM | POA: Diagnosis present

## 2021-10-09 DIAGNOSIS — T84023D Instability of internal left knee prosthesis, subsequent encounter: Secondary | ICD-10-CM | POA: Diagnosis not present

## 2021-10-09 DIAGNOSIS — Z79890 Hormone replacement therapy (postmenopausal): Secondary | ICD-10-CM | POA: Diagnosis not present

## 2021-10-09 DIAGNOSIS — Z83511 Family history of glaucoma: Secondary | ICD-10-CM

## 2021-10-09 DIAGNOSIS — Z83438 Family history of other disorder of lipoprotein metabolism and other lipidemia: Secondary | ICD-10-CM

## 2021-10-09 DIAGNOSIS — I10 Essential (primary) hypertension: Secondary | ICD-10-CM | POA: Diagnosis not present

## 2021-10-09 DIAGNOSIS — Z96659 Presence of unspecified artificial knee joint: Secondary | ICD-10-CM | POA: Diagnosis not present

## 2021-10-09 DIAGNOSIS — D649 Anemia, unspecified: Secondary | ICD-10-CM | POA: Diagnosis not present

## 2021-10-09 DIAGNOSIS — M2352 Chronic instability of knee, left knee: Principal | ICD-10-CM

## 2021-10-09 DIAGNOSIS — Y792 Prosthetic and other implants, materials and accessory orthopedic devices associated with adverse incidents: Secondary | ICD-10-CM | POA: Diagnosis present

## 2021-10-09 DIAGNOSIS — R2681 Unsteadiness on feet: Secondary | ICD-10-CM | POA: Diagnosis not present

## 2021-10-09 DIAGNOSIS — K219 Gastro-esophageal reflux disease without esophagitis: Secondary | ICD-10-CM | POA: Diagnosis present

## 2021-10-09 DIAGNOSIS — I509 Heart failure, unspecified: Secondary | ICD-10-CM | POA: Diagnosis not present

## 2021-10-09 DIAGNOSIS — Z96652 Presence of left artificial knee joint: Secondary | ICD-10-CM | POA: Diagnosis not present

## 2021-10-09 DIAGNOSIS — H409 Unspecified glaucoma: Secondary | ICD-10-CM | POA: Diagnosis present

## 2021-10-09 DIAGNOSIS — J45909 Unspecified asthma, uncomplicated: Secondary | ICD-10-CM | POA: Diagnosis present

## 2021-10-09 DIAGNOSIS — R41841 Cognitive communication deficit: Secondary | ICD-10-CM | POA: Diagnosis not present

## 2021-10-09 DIAGNOSIS — G5702 Lesion of sciatic nerve, left lower limb: Secondary | ICD-10-CM | POA: Diagnosis not present

## 2021-10-09 DIAGNOSIS — I130 Hypertensive heart and chronic kidney disease with heart failure and stage 1 through stage 4 chronic kidney disease, or unspecified chronic kidney disease: Secondary | ICD-10-CM | POA: Diagnosis present

## 2021-10-09 DIAGNOSIS — R001 Bradycardia, unspecified: Secondary | ICD-10-CM | POA: Diagnosis not present

## 2021-10-09 DIAGNOSIS — Z87891 Personal history of nicotine dependence: Secondary | ICD-10-CM

## 2021-10-09 DIAGNOSIS — Z6838 Body mass index (BMI) 38.0-38.9, adult: Secondary | ICD-10-CM

## 2021-10-09 DIAGNOSIS — Z96651 Presence of right artificial knee joint: Secondary | ICD-10-CM | POA: Diagnosis present

## 2021-10-09 DIAGNOSIS — Z4789 Encounter for other orthopedic aftercare: Secondary | ICD-10-CM | POA: Diagnosis not present

## 2021-10-09 DIAGNOSIS — E782 Mixed hyperlipidemia: Secondary | ICD-10-CM | POA: Diagnosis present

## 2021-10-09 DIAGNOSIS — R69 Illness, unspecified: Secondary | ICD-10-CM | POA: Diagnosis not present

## 2021-10-09 DIAGNOSIS — M1712 Unilateral primary osteoarthritis, left knee: Secondary | ICD-10-CM | POA: Diagnosis not present

## 2021-10-09 DIAGNOSIS — L4052 Psoriatic arthritis mutilans: Secondary | ICD-10-CM | POA: Diagnosis not present

## 2021-10-09 DIAGNOSIS — Z743 Need for continuous supervision: Secondary | ICD-10-CM | POA: Diagnosis not present

## 2021-10-09 DIAGNOSIS — Z8249 Family history of ischemic heart disease and other diseases of the circulatory system: Secondary | ICD-10-CM

## 2021-10-09 DIAGNOSIS — M6281 Muscle weakness (generalized): Secondary | ICD-10-CM | POA: Diagnosis not present

## 2021-10-09 DIAGNOSIS — E119 Type 2 diabetes mellitus without complications: Secondary | ICD-10-CM | POA: Diagnosis not present

## 2021-10-09 DIAGNOSIS — N183 Chronic kidney disease, stage 3 unspecified: Secondary | ICD-10-CM | POA: Diagnosis not present

## 2021-10-09 HISTORY — DX: Other forms of dyspnea: R06.09

## 2021-10-09 HISTORY — DX: Arthropathic psoriasis, unspecified: L40.50

## 2021-10-09 HISTORY — DX: Opioid use, unspecified, uncomplicated: F11.90

## 2021-10-09 HISTORY — DX: Psoriasis, unspecified: L40.9

## 2021-10-09 HISTORY — DX: Long term (current) use of unspecified immunomodulators and immunosuppressants: Z79.60

## 2021-10-09 HISTORY — DX: Chronic kidney disease, stage 3 unspecified: N18.30

## 2021-10-09 HISTORY — DX: Unspecified osteoarthritis, unspecified site: M19.90

## 2021-10-09 HISTORY — DX: Other specified abnormal immunological findings in serum: R76.8

## 2021-10-09 HISTORY — DX: Presence of unspecified artificial knee joint: Z96.659

## 2021-10-09 HISTORY — DX: Chronic pain syndrome: G89.4

## 2021-10-09 HISTORY — DX: Rheumatoid arthritis, unspecified: M06.9

## 2021-10-09 HISTORY — DX: Other intervertebral disc degeneration, lumbar region without mention of lumbar back pain or lower extremity pain: M51.369

## 2021-10-09 HISTORY — DX: Anemia in chronic kidney disease: D63.1

## 2021-10-09 HISTORY — DX: Gout, unspecified: M10.9

## 2021-10-09 HISTORY — PX: TOTAL KNEE REVISION: SHX996

## 2021-10-09 HISTORY — DX: Other long term (current) drug therapy: Z79.899

## 2021-10-09 HISTORY — DX: Other specified abnormal immunological findings in serum: R76.89

## 2021-10-09 HISTORY — DX: Atherosclerosis of aorta: I70.0

## 2021-10-09 HISTORY — DX: Other intervertebral disc degeneration, lumbar region: M51.36

## 2021-10-09 LAB — POCT I-STAT, CHEM 8
BUN: 42 mg/dL — ABNORMAL HIGH (ref 8–23)
Calcium, Ion: 1.29 mmol/L (ref 1.15–1.40)
Chloride: 107 mmol/L (ref 98–111)
Creatinine, Ser: 3.4 mg/dL — ABNORMAL HIGH (ref 0.44–1.00)
Glucose, Bld: 78 mg/dL (ref 70–99)
HCT: 24 % — ABNORMAL LOW (ref 36.0–46.0)
Hemoglobin: 8.2 g/dL — ABNORMAL LOW (ref 12.0–15.0)
Potassium: 4.1 mmol/L (ref 3.5–5.1)
Sodium: 141 mmol/L (ref 135–145)
TCO2: 23 mmol/L (ref 22–32)

## 2021-10-09 LAB — ABO/RH: ABO/RH(D): O POS

## 2021-10-09 SURGERY — TOTAL KNEE REVISION
Anesthesia: General | Site: Knee | Laterality: Left

## 2021-10-09 MED ORDER — TRANEXAMIC ACID-NACL 1000-0.7 MG/100ML-% IV SOLN
INTRAVENOUS | Status: AC
  Filled 2021-10-09: qty 100

## 2021-10-09 MED ORDER — ACETAMINOPHEN 10 MG/ML IV SOLN: 1000.0000 mg | Freq: Once | INTRAVENOUS | Status: DC | PRN

## 2021-10-09 MED ORDER — CELECOXIB 200 MG PO CAPS
200.0000 mg | ORAL_CAPSULE | Freq: Two times a day (BID) | ORAL | Status: DC
  Administered 2021-10-09 – 2021-10-11 (×4): 200 mg via ORAL
  Filled 2021-10-09 (×4): qty 1

## 2021-10-09 MED ORDER — CLOBETASOL PROPIONATE 0.05 % EX OINT
TOPICAL_OINTMENT | Freq: Two times a day (BID) | CUTANEOUS | Status: DC
Start: 2021-10-09 — End: 2021-10-14
  Filled 2021-10-09: qty 15

## 2021-10-09 MED ORDER — MECLIZINE HCL 25 MG PO TABS
12.5000 mg | ORAL_TABLET | Freq: Three times a day (TID) | ORAL | Status: DC | PRN
Start: 2021-10-09 — End: 2021-10-14

## 2021-10-09 MED ORDER — BUPIVACAINE HCL (PF) 0.5 % IJ SOLN
INTRAMUSCULAR | Status: DC | PRN
  Administered 2021-10-09: 3.2 mL via INTRATHECAL

## 2021-10-09 MED ORDER — ALUM & MAG HYDROXIDE-SIMETH 200-200-20 MG/5ML PO SUSP: 30.0000 mL | ORAL | Status: DC | PRN

## 2021-10-09 MED ORDER — FENTANYL CITRATE (PF) 100 MCG/2ML IJ SOLN
INTRAMUSCULAR | Status: AC
  Filled 2021-10-09: qty 2

## 2021-10-09 MED ORDER — DAPAGLIFLOZIN PROPANEDIOL 10 MG PO TABS
10.0000 mg | ORAL_TABLET | Freq: Every morning | ORAL | Status: DC
  Administered 2021-10-10 – 2021-10-14 (×5): 10 mg via ORAL
  Filled 2021-10-09 (×5): qty 1

## 2021-10-09 MED ORDER — MONTELUKAST SODIUM 10 MG PO TABS
10.0000 mg | ORAL_TABLET | Freq: Every day | ORAL | Status: DC
Start: 2021-10-09 — End: 2021-10-14
  Administered 2021-10-09 – 2021-10-13 (×5): 10 mg via ORAL
  Filled 2021-10-09 (×5): qty 1

## 2021-10-09 MED ORDER — HYDROMORPHONE HCL 1 MG/ML IJ SOLN
INTRAMUSCULAR | Status: AC
  Administered 2021-10-09: 0.5 mg via INTRAVENOUS
  Filled 2021-10-09: qty 1

## 2021-10-09 MED ORDER — LEVOTHYROXINE SODIUM 50 MCG PO TABS
175.0000 ug | ORAL_TABLET | Freq: Every day | ORAL | Status: DC
  Administered 2021-10-10 – 2021-10-14 (×5): 175 ug via ORAL
  Filled 2021-10-09 (×5): qty 1

## 2021-10-09 MED ORDER — SODIUM CHLORIDE (PF) 0.9 % IJ SOLN
INTRAMUSCULAR | Status: DC | PRN
  Administered 2021-10-09: 120 mL

## 2021-10-09 MED ORDER — BISACODYL 10 MG RE SUPP: 10.0000 mg | Freq: Every day | RECTAL | Status: DC | PRN

## 2021-10-09 MED ORDER — PROPOFOL 500 MG/50ML IV EMUL
INTRAVENOUS | Status: DC | PRN
  Administered 2021-10-09: 50 ug/kg/min via INTRAVENOUS

## 2021-10-09 MED ORDER — VITAMIN D 25 MCG (1000 UNIT) PO TABS
2000.0000 [IU] | ORAL_TABLET | Freq: Every day | ORAL | Status: DC
  Administered 2021-10-09 – 2021-10-14 (×6): 2000 [IU] via ORAL
  Filled 2021-10-09 (×6): qty 2

## 2021-10-09 MED ORDER — TRIAMCINOLONE ACETONIDE 0.1 % EX CREA: TOPICAL_CREAM | Freq: Two times a day (BID) | CUTANEOUS | Status: DC | PRN

## 2021-10-09 MED ORDER — TRIAMTERENE-HCTZ 37.5-25 MG PO TABS
2.0000 | ORAL_TABLET | Freq: Every day | ORAL | Status: DC
  Administered 2021-10-09 – 2021-10-14 (×6): 2 via ORAL
  Filled 2021-10-09 (×6): qty 2

## 2021-10-09 MED ORDER — OXYCODONE HCL 5 MG PO TABS
10.0000 mg | ORAL_TABLET | ORAL | Status: DC | PRN
  Administered 2021-10-10 – 2021-10-14 (×10): 10 mg via ORAL
  Filled 2021-10-09 (×10): qty 2

## 2021-10-09 MED ORDER — NYSTATIN 100000 UNIT/GM EX CREA
1.0000 | TOPICAL_CREAM | Freq: Two times a day (BID) | CUTANEOUS | Status: DC
  Administered 2021-10-10 – 2021-10-14 (×5): 1 via TOPICAL
  Filled 2021-10-09: qty 30

## 2021-10-09 MED ORDER — GABAPENTIN 300 MG PO CAPS
ORAL_CAPSULE | ORAL | Status: AC
  Administered 2021-10-09: 300 mg via ORAL
  Filled 2021-10-09: qty 1

## 2021-10-09 MED ORDER — CALCIPOTRIENE 0.005 % EX CREA: TOPICAL_CREAM | Freq: Two times a day (BID) | CUTANEOUS | Status: DC

## 2021-10-09 MED ORDER — HYDROMORPHONE HCL 1 MG/ML IJ SOLN
0.2500 mg | INTRAMUSCULAR | Status: DC | PRN
  Administered 2021-10-09: 0.5 mg via INTRAVENOUS

## 2021-10-09 MED ORDER — OXYCODONE HCL 5 MG PO TABS
ORAL_TABLET | ORAL | Status: AC
  Filled 2021-10-09: qty 1

## 2021-10-09 MED ORDER — TIZANIDINE HCL 4 MG PO TABS
4.0000 mg | ORAL_TABLET | Freq: Every day | ORAL | Status: DC
  Administered 2021-10-09 – 2021-10-13 (×5): 4 mg via ORAL
  Filled 2021-10-09 (×5): qty 1

## 2021-10-09 MED ORDER — FERROUS SULFATE 325 (65 FE) MG PO TABS
325.0000 mg | ORAL_TABLET | Freq: Two times a day (BID) | ORAL | Status: DC
Start: 2021-10-10 — End: 2021-10-14
  Administered 2021-10-10 – 2021-10-14 (×8): 325 mg via ORAL
  Filled 2021-10-09 (×8): qty 1

## 2021-10-09 MED ORDER — FENTANYL CITRATE (PF) 100 MCG/2ML IJ SOLN
INTRAMUSCULAR | Status: DC | PRN
  Administered 2021-10-09 (×2): 50 ug via INTRAVENOUS

## 2021-10-09 MED ORDER — SURGIRINSE WOUND IRRIGATION SYSTEM - OPTIME
TOPICAL | Status: DC | PRN
  Administered 2021-10-09: 900 mL via TOPICAL

## 2021-10-09 MED ORDER — OXYCODONE HCL 5 MG/5ML PO SOLN: 5.0000 mg | Freq: Once | ORAL | Status: AC | PRN

## 2021-10-09 MED ORDER — ENOXAPARIN SODIUM 30 MG/0.3ML IJ SOSY
30.0000 mg | PREFILLED_SYRINGE | Freq: Two times a day (BID) | INTRAMUSCULAR | Status: DC
  Administered 2021-10-10 – 2021-10-11 (×3): 30 mg via SUBCUTANEOUS
  Filled 2021-10-09 (×3): qty 0.3

## 2021-10-09 MED ORDER — MAGNESIUM HYDROXIDE 400 MG/5ML PO SUSP
30.0000 mL | Freq: Every day | ORAL | Status: DC
  Administered 2021-10-09 – 2021-10-14 (×6): 30 mL via ORAL
  Filled 2021-10-09 (×6): qty 30

## 2021-10-09 MED ORDER — TRIAMTERENE-HCTZ 75-50 MG PO TABS
1.0000 | ORAL_TABLET | Freq: Every day | ORAL | Status: DC
  Filled 2021-10-09: qty 1

## 2021-10-09 MED ORDER — TRAMADOL HCL 50 MG PO TABS
50.0000 mg | ORAL_TABLET | ORAL | Status: DC | PRN
  Administered 2021-10-09 – 2021-10-13 (×3): 100 mg via ORAL
  Filled 2021-10-09 (×3): qty 2

## 2021-10-09 MED ORDER — SODIUM CHLORIDE 0.9 % IV SOLN
INTRAVENOUS | Status: DC
Start: 2021-10-09 — End: 2021-10-14

## 2021-10-09 MED ORDER — ACETAMINOPHEN 10 MG/ML IV SOLN
1000.0000 mg | Freq: Four times a day (QID) | INTRAVENOUS | Status: AC
  Administered 2021-10-09 – 2021-10-10 (×4): 1000 mg via INTRAVENOUS
  Filled 2021-10-09 (×4): qty 100

## 2021-10-09 MED ORDER — GLYCOPYRROLATE 0.2 MG/ML IJ SOLN
INTRAMUSCULAR | Status: DC | PRN
  Administered 2021-10-09: .2 mg via INTRAVENOUS

## 2021-10-09 MED ORDER — ONE-A-DAY WOMENS FORMULA PO TABS: 1.0000 | ORAL_TABLET | Freq: Every day | ORAL | Status: DC

## 2021-10-09 MED ORDER — OXYCODONE HCL 5 MG PO TABS
5.0000 mg | ORAL_TABLET | Freq: Once | ORAL | Status: AC | PRN
  Administered 2021-10-09: 5 mg via ORAL

## 2021-10-09 MED ORDER — TRIAMCINOLONE ACETONIDE 0.1 % EX CREA
1.0000 | TOPICAL_CREAM | Freq: Two times a day (BID) | CUTANEOUS | Status: DC
Start: 2021-10-09 — End: 2021-10-14
  Administered 2021-10-11 – 2021-10-14 (×5): 1 via TOPICAL
  Filled 2021-10-09 (×2): qty 15

## 2021-10-09 MED ORDER — TRANEXAMIC ACID-NACL 1000-0.7 MG/100ML-% IV SOLN: 1000.0000 mg | Freq: Once | INTRAVENOUS | Status: AC

## 2021-10-09 MED ORDER — ENSURE PRE-SURGERY PO LIQD
296.0000 mL | Freq: Once | ORAL | Status: DC
  Filled 2021-10-09: qty 296

## 2021-10-09 MED ORDER — POTASSIUM CHLORIDE CRYS ER 10 MEQ PO TBCR
10.0000 meq | EXTENDED_RELEASE_TABLET | Freq: Every day | ORAL | Status: DC
Start: 2021-10-10 — End: 2021-10-14
  Administered 2021-10-10 – 2021-10-14 (×5): 10 meq via ORAL
  Filled 2021-10-09 (×5): qty 1

## 2021-10-09 MED ORDER — ONDANSETRON HCL 4 MG/2ML IJ SOLN: 4.0000 mg | Freq: Four times a day (QID) | INTRAMUSCULAR | Status: DC | PRN

## 2021-10-09 MED ORDER — METHOTREXATE 2.5 MG PO TABS
7.5000 mg | ORAL_TABLET | ORAL | Status: DC
  Administered 2021-10-11: 7.5 mg via ORAL
  Filled 2021-10-09 (×2): qty 3

## 2021-10-09 MED ORDER — GABAPENTIN 600 MG PO TABS
600.0000 mg | ORAL_TABLET | Freq: Every day | ORAL | Status: DC
  Administered 2021-10-09 – 2021-10-13 (×5): 600 mg via ORAL
  Filled 2021-10-09 (×6): qty 1

## 2021-10-09 MED ORDER — ONDANSETRON HCL 4 MG/2ML IJ SOLN
INTRAMUSCULAR | Status: DC | PRN
  Administered 2021-10-09: 4 mg via INTRAVENOUS

## 2021-10-09 MED ORDER — DEXAMETHASONE SODIUM PHOSPHATE 10 MG/ML IJ SOLN
INTRAMUSCULAR | Status: AC
  Administered 2021-10-09: 8 mg via INTRAVENOUS
  Filled 2021-10-09: qty 1

## 2021-10-09 MED ORDER — ACETAMINOPHEN 10 MG/ML IV SOLN
INTRAVENOUS | Status: DC | PRN
  Administered 2021-10-09: 1000 mg via INTRAVENOUS

## 2021-10-09 MED ORDER — EPHEDRINE SULFATE (PRESSORS) 50 MG/ML IJ SOLN
INTRAMUSCULAR | Status: DC | PRN
  Administered 2021-10-09: 10 mg via INTRAVENOUS
  Administered 2021-10-09 (×2): 5 mg via INTRAVENOUS

## 2021-10-09 MED ORDER — ALBUTEROL SULFATE (2.5 MG/3ML) 0.083% IN NEBU: 3.0000 mL | INHALATION_SOLUTION | Freq: Four times a day (QID) | RESPIRATORY_TRACT | Status: DC | PRN

## 2021-10-09 MED ORDER — DIPHENHYDRAMINE HCL 12.5 MG/5ML PO ELIX: 12.5000 mg | ORAL_SOLUTION | ORAL | Status: DC | PRN

## 2021-10-09 MED ORDER — SENNOSIDES-DOCUSATE SODIUM 8.6-50 MG PO TABS
1.0000 | ORAL_TABLET | Freq: Two times a day (BID) | ORAL | Status: DC
  Administered 2021-10-09 – 2021-10-14 (×10): 1 via ORAL
  Filled 2021-10-09 (×10): qty 1

## 2021-10-09 MED ORDER — PROPOFOL 1000 MG/100ML IV EMUL
INTRAVENOUS | Status: AC
  Filled 2021-10-09: qty 100

## 2021-10-09 MED ORDER — LACTATED RINGERS IV SOLN: INTRAVENOUS | Status: DC

## 2021-10-09 MED ORDER — OXYCODONE HCL 5 MG PO TABS
5.0000 mg | ORAL_TABLET | ORAL | Status: DC | PRN
  Administered 2021-10-09 – 2021-10-10 (×2): 5 mg via ORAL
  Filled 2021-10-09 (×3): qty 1

## 2021-10-09 MED ORDER — FENTANYL CITRATE (PF) 100 MCG/2ML IJ SOLN
INTRAMUSCULAR | Status: AC
  Administered 2021-10-09: 50 ug via INTRAVENOUS
  Filled 2021-10-09: qty 2

## 2021-10-09 MED ORDER — HYDROMORPHONE HCL 1 MG/ML IJ SOLN
INTRAMUSCULAR | Status: AC
  Filled 2021-10-09: qty 1

## 2021-10-09 MED ORDER — PANTOPRAZOLE SODIUM 40 MG PO TBEC
40.0000 mg | DELAYED_RELEASE_TABLET | Freq: Two times a day (BID) | ORAL | Status: DC
Start: 2021-10-09 — End: 2021-10-14
  Administered 2021-10-09 – 2021-10-14 (×10): 40 mg via ORAL
  Filled 2021-10-09 (×10): qty 1

## 2021-10-09 MED ORDER — PROPOFOL 10 MG/ML IV BOLUS
INTRAVENOUS | Status: AC
  Filled 2021-10-09: qty 20

## 2021-10-09 MED ORDER — ALLOPURINOL 100 MG PO TABS
200.0000 mg | ORAL_TABLET | ORAL | Status: DC
  Administered 2021-10-10 – 2021-10-14 (×5): 200 mg via ORAL
  Filled 2021-10-09 (×5): qty 2

## 2021-10-09 MED ORDER — PROPOFOL 10 MG/ML IV BOLUS
INTRAVENOUS | Status: DC | PRN
  Administered 2021-10-09: 20 mg via INTRAVENOUS
  Administered 2021-10-09: 30 mg via INTRAVENOUS

## 2021-10-09 MED ORDER — PHENYLEPHRINE HCL-NACL 20-0.9 MG/250ML-% IV SOLN
INTRAVENOUS | Status: AC
  Filled 2021-10-09: qty 250

## 2021-10-09 MED ORDER — MENTHOL 3 MG MT LOZG: 1.0000 | LOZENGE | OROMUCOSAL | Status: DC | PRN

## 2021-10-09 MED ORDER — ACETAMINOPHEN 10 MG/ML IV SOLN
INTRAVENOUS | Status: AC
  Filled 2021-10-09: qty 100

## 2021-10-09 MED ORDER — ADULT MULTIVITAMIN W/MINERALS CH
1.0000 | ORAL_TABLET | Freq: Every day | ORAL | Status: DC
  Administered 2021-10-09 – 2021-10-14 (×6): 1 via ORAL
  Filled 2021-10-09 (×6): qty 1

## 2021-10-09 MED ORDER — HYDROMORPHONE HCL 1 MG/ML IJ SOLN
0.5000 mg | INTRAMUSCULAR | Status: DC | PRN
  Administered 2021-10-09: 1 mg via INTRAVENOUS
  Filled 2021-10-09: qty 1

## 2021-10-09 MED ORDER — BUPIVACAINE HCL (PF) 0.25 % IJ SOLN
INTRAMUSCULAR | Status: AC
  Filled 2021-10-09: qty 30

## 2021-10-09 MED ORDER — CEFAZOLIN SODIUM-DEXTROSE 2-4 GM/100ML-% IV SOLN
2.0000 g | Freq: Four times a day (QID) | INTRAVENOUS | Status: AC
  Administered 2021-10-09 – 2021-10-10 (×2): 2 g via INTRAVENOUS
  Filled 2021-10-09 (×2): qty 100

## 2021-10-09 MED ORDER — TRANEXAMIC ACID-NACL 1000-0.7 MG/100ML-% IV SOLN
INTRAVENOUS | Status: AC
  Administered 2021-10-09: 1000 mg via INTRAVENOUS
  Filled 2021-10-09: qty 100

## 2021-10-09 MED ORDER — METOCLOPRAMIDE HCL 5 MG PO TABS
10.0000 mg | ORAL_TABLET | Freq: Three times a day (TID) | ORAL | Status: AC
  Administered 2021-10-09 – 2021-10-11 (×7): 10 mg via ORAL
  Filled 2021-10-09 (×7): qty 2

## 2021-10-09 MED ORDER — ONDANSETRON HCL 4 MG/2ML IJ SOLN: 4.0000 mg | Freq: Once | INTRAMUSCULAR | Status: DC | PRN

## 2021-10-09 MED ORDER — MIDAZOLAM HCL 2 MG/2ML IJ SOLN
INTRAMUSCULAR | Status: AC
  Filled 2021-10-09: qty 2

## 2021-10-09 MED ORDER — METOPROLOL TARTRATE 25 MG PO TABS
25.0000 mg | ORAL_TABLET | Freq: Two times a day (BID) | ORAL | Status: DC
  Administered 2021-10-09: 25 mg via ORAL
  Filled 2021-10-09: qty 1

## 2021-10-09 MED ORDER — OMEGA-3-ACID ETHYL ESTERS 1 G PO CAPS
1.0000 | ORAL_CAPSULE | Freq: Two times a day (BID) | ORAL | Status: DC
  Administered 2021-10-09 – 2021-10-14 (×10): 1 g via ORAL
  Filled 2021-10-09 (×10): qty 1

## 2021-10-09 MED ORDER — 0.9 % SODIUM CHLORIDE (POUR BTL) OPTIME
TOPICAL | Status: DC | PRN
  Administered 2021-10-09: 500 mL

## 2021-10-09 MED ORDER — SODIUM CHLORIDE 0.9 % IR SOLN
Status: DC | PRN
  Administered 2021-10-09: 3000 mL

## 2021-10-09 MED ORDER — CHLORHEXIDINE GLUCONATE 0.12 % MT SOLN
OROMUCOSAL | Status: AC
  Administered 2021-10-09: 15 mL via OROMUCOSAL
  Filled 2021-10-09: qty 15

## 2021-10-09 MED ORDER — FUROSEMIDE 40 MG PO TABS
40.0000 mg | ORAL_TABLET | Freq: Every day | ORAL | Status: DC
  Administered 2021-10-11 – 2021-10-14 (×4): 40 mg via ORAL
  Filled 2021-10-09 (×4): qty 1

## 2021-10-09 MED ORDER — FENTANYL CITRATE (PF) 100 MCG/2ML IJ SOLN
25.0000 ug | INTRAMUSCULAR | Status: DC | PRN
  Administered 2021-10-09: 50 ug via INTRAVENOUS

## 2021-10-09 MED ORDER — APREMILAST 30 MG PO TABS: 1.0000 | ORAL_TABLET | Freq: Every day | ORAL | Status: DC

## 2021-10-09 MED ORDER — FLEET ENEMA 7-19 GM/118ML RE ENEM: 1.0000 | ENEMA | Freq: Once | RECTAL | Status: DC | PRN

## 2021-10-09 MED ORDER — PHENOL 1.4 % MT LIQD: 1.0000 | OROMUCOSAL | Status: DC | PRN

## 2021-10-09 MED ORDER — PHENYLEPHRINE HCL-NACL 20-0.9 MG/250ML-% IV SOLN
INTRAVENOUS | Status: DC | PRN
  Administered 2021-10-09: 25 ug/min via INTRAVENOUS

## 2021-10-09 MED ORDER — ACETAMINOPHEN 325 MG PO TABS: 325.0000 mg | ORAL_TABLET | Freq: Four times a day (QID) | ORAL | Status: DC | PRN

## 2021-10-09 MED ORDER — KETOCONAZOLE 2 % EX CREA: TOPICAL_CREAM | Freq: Every day | CUTANEOUS | Status: DC | PRN

## 2021-10-09 MED ORDER — ONDANSETRON HCL 4 MG PO TABS: 4.0000 mg | ORAL_TABLET | Freq: Four times a day (QID) | ORAL | Status: DC | PRN

## 2021-10-09 MED ORDER — MIDAZOLAM HCL 5 MG/5ML IJ SOLN
INTRAMUSCULAR | Status: DC | PRN
  Administered 2021-10-09 (×2): 1 mg via INTRAVENOUS

## 2021-10-09 SURGICAL SUPPLY — 61 items
COOLER POLAR GLACIER W/PUMP (MISCELLANEOUS) ×3 IMPLANT
COVER BACK TABLE REUSABLE LG (DRAPES) ×3 IMPLANT
DRAPE 3/4 80X56 (DRAPES) ×6 IMPLANT
DRSG DERMACEA NONADH 3X8 (GAUZE/BANDAGES/DRESSINGS) ×3 IMPLANT
DRSG MEPILEX SACRM 8.7X9.8 (GAUZE/BANDAGES/DRESSINGS) ×3 IMPLANT
DRSG OPSITE POSTOP 4X14 (GAUZE/BANDAGES/DRESSINGS) ×3 IMPLANT
DRSG TEGADERM 4X4.75 (GAUZE/BANDAGES/DRESSINGS) ×3 IMPLANT
DURAPREP 26ML APPLICATOR (WOUND CARE) ×6 IMPLANT
ELECT CAUTERY BLADE 6.4 (BLADE) ×3 IMPLANT
ELECT REM PT RETURN 9FT ADLT (ELECTROSURGICAL) ×2
ELECTRODE REM PT RTRN 9FT ADLT (ELECTROSURGICAL) ×2 IMPLANT
GAUZE SPONGE 4X4 12PLY STRL (GAUZE/BANDAGES/DRESSINGS) ×3 IMPLANT
GLOVE BIOGEL M STRL SZ7.5 (GLOVE) ×13 IMPLANT
GLOVE BIOGEL PI IND STRL 8 (GLOVE) ×2 IMPLANT
GLOVE BIOGEL PI INDICATOR 8 (GLOVE) ×2
GLOVE BIOGEL PI ORTHO PRO 7.5 (GLOVE) ×4
GLOVE PI ORTHO PRO STRL 7.5 (GLOVE) ×4 IMPLANT
GLOVE SURG UNDER LTX SZ8 (GLOVE) ×3 IMPLANT
GLOVE SURG UNDER POLY LF SZ7.5 (GLOVE) ×3 IMPLANT
GOWN STRL REUS W/ TWL LRG LVL3 (GOWN DISPOSABLE) ×4 IMPLANT
GOWN STRL REUS W/TWL LRG LVL3 (GOWN DISPOSABLE) ×4
HEMOVAC 400CC 10FR (MISCELLANEOUS) ×3 IMPLANT
HOLDER FOLEY CATH W/STRAP (MISCELLANEOUS) ×3 IMPLANT
HOOD PEEL AWAY FLYTE STAYCOOL (MISCELLANEOUS) ×6 IMPLANT
INSERT TIB BEARING 5X16 E (Joint) ×1 IMPLANT
IV NS IRRIG 3000ML ARTHROMATIC (IV SOLUTION) ×3 IMPLANT
KIT TURNOVER KIT A (KITS) ×3 IMPLANT
KNIFE SCULPS 14X20 (INSTRUMENTS) ×3 IMPLANT
MANIFOLD NEPTUNE II (INSTRUMENTS) ×6 IMPLANT
NDL SPNL 20GX3.5 QUINCKE YW (NEEDLE) ×4 IMPLANT
NEEDLE SPNL 20GX3.5 QUINCKE YW (NEEDLE) ×4 IMPLANT
NS IRRIG 1000ML POUR BTL (IV SOLUTION) ×3 IMPLANT
PACK TOTAL KNEE (MISCELLANEOUS) ×3 IMPLANT
PAD ABD DERMACEA PRESS 5X9 (GAUZE/BANDAGES/DRESSINGS) ×6 IMPLANT
PAD WRAPON POLAR KNEE (MISCELLANEOUS) ×2 IMPLANT
PULSAVAC PLUS IRRIG FAN TIP (DISPOSABLE) ×2
SOL PREP PVP 2OZ (MISCELLANEOUS) ×2
SOLUTION IRRIG SURGIPHOR (IV SOLUTION) ×1 IMPLANT
SOLUTION PREP PVP 2OZ (MISCELLANEOUS) ×2 IMPLANT
SOLUTION PRONTOSAN WOUND 350ML (IRRIGATION / IRRIGATOR) ×3 IMPLANT
SPONGE DRAIN TRACH 4X4 STRL 2S (GAUZE/BANDAGES/DRESSINGS) ×3 IMPLANT
SPONGE T-LAP 18X18 ~~LOC~~+RFID (SPONGE) IMPLANT
STAPLER SKIN PROX 35W (STAPLE) ×3 IMPLANT
SUCTION FRAZIER HANDLE 10FR (MISCELLANEOUS) ×2
SUCTION TUBE FRAZIER 10FR DISP (MISCELLANEOUS) ×2 IMPLANT
SUT MNCRL 0 1X36 CT-1 (SUTURE) ×2 IMPLANT
SUT MON AB 2-0 CT1 36 (SUTURE) ×3 IMPLANT
SUT MONOCRYL 0 (SUTURE) ×2
SUT PROLENE 1 CT 1 30 (SUTURE) ×3 IMPLANT
SUT VIC AB 0 CT1 36 (SUTURE) ×3 IMPLANT
SUT VIC AB 1 CT1 36 (SUTURE) ×6 IMPLANT
SUT VIC AB 2-0 CT1 (SUTURE) ×3 IMPLANT
SWAB CULTURE AMIES ANAERIB BLU (MISCELLANEOUS) ×1 IMPLANT
SYR 20ML LL LF (SYRINGE) ×3 IMPLANT
SYR 30ML LL (SYRINGE) ×6 IMPLANT
TIP FAN IRRIG PULSAVAC PLUS (DISPOSABLE) ×2 IMPLANT
TOWEL OR 17X26 4PK STRL BLUE (TOWEL DISPOSABLE) ×3 IMPLANT
TRAP FLUID SMOKE EVACUATOR (MISCELLANEOUS) ×3 IMPLANT
TRAY FOLEY MTR SLVR 16FR STAT (SET/KITS/TRAYS/PACK) ×3 IMPLANT
WATER STERILE IRR 1000ML POUR (IV SOLUTION) ×3 IMPLANT
WRAPON POLAR PAD KNEE (MISCELLANEOUS) ×2

## 2021-10-09 NOTE — Anesthesia Procedure Notes (Signed)
Spinal  Patient location during procedure: OR Start time: 10/09/2021 12:50 PM End time: 10/09/2021 12:59 PM Reason for block: surgical anesthesia Staffing Performed: anesthesiologist  Anesthesiologist: Darrin Nipper, MD Resident/CRNA: Lowry Bowl, CRNA Performed by: Darrin Nipper, MD Authorized by: Darrin Nipper, MD   Preanesthetic Checklist Completed: patient identified, IV checked, site marked, risks and benefits discussed, surgical consent, monitors and equipment checked, pre-op evaluation and timeout performed Spinal Block Patient position: sitting Prep: ChloraPrep Patient monitoring: heart rate, cardiac monitor, continuous pulse ox and blood pressure Approach: midline Location: L2-3 Injection technique: single-shot Needle Needle type: [place]  Needle gauge: 22 G Needle length: 9 cm Assessment Sensory level: T4 Events: CSF return and second provider Additional Notes Difficult placement 2/2 os.  Attempts by Cyd Silence.

## 2021-10-09 NOTE — Anesthesia Preprocedure Evaluation (Addendum)
Anesthesia Evaluation  Patient identified by MRN, date of birth, ID band Patient awake    Reviewed: Allergy & Precautions, NPO status , Patient's Chart, lab work & pertinent test results  History of Anesthesia Complications Negative for: history of anesthetic complications  Airway Mallampati: IV   Neck ROM: Full    Dental  (+) Missing, Upper Dentures, Lower Dentures   Pulmonary asthma , former smoker (quit 1995),    Pulmonary exam normal breath sounds clear to auscultation       Cardiovascular hypertension, +CHF (preserved EF)  Normal cardiovascular exam Rhythm:Regular Rate:Normal  ECG 08/22/21: Sinus rhythm with PACs and occasional PVCs  Echo 10/01/18:  1. The left ventricle has normal systolic function, with an ejection fraction of 55-60%. The cavity size was normal. There is mild concentric left ventricular hypertrophy. Left ventricular diastolic Doppler parameters are consistent with impaired relaxation.  2. The right ventricle has normal systolic function. The cavity was normal. There is no increase in right ventricular wall thickness. Right ventricular systolic pressure could not be assessed.  3. Left atrial size was mildly dilated.  4. The mitral valve is grossly normal.  5. The tricuspid valve is grossly normal.  6. The aortic valve is grossly normal. No stenosis of the aortic valve.  7. The aorta is normal in size and structure.  8. PVCs noted during the study.  Myocardial perfusion 10/01/18:  1. Normal left ventricular systolic function with an EF of 55-65% 2. No evidence of stress-induced myocardial ischemia or arrhythmia; no scintigraphic evidence of scar 3. Normal low risk study   Neuro/Psych  Headaches, Chronic pain  Neuromuscular disease (neuropathy with right foot numbness)    GI/Hepatic hiatal hernia, PUD, GERD  ,  Endo/Other  Hypothyroidism   Renal/GU Renal disease (stage III CKD)      Musculoskeletal  (+) Arthritis , Osteoarthritis and Rheumatoid disorders,  Gout    Abdominal   Peds  Hematology  (+) Blood dyscrasia, anemia ,   Anesthesia Other Findings Reviewed and agree with Bayard Males pre-anesthesia clinical review note.   Cardiology note 10/07/21:  Preoperative Cardiovascular Risk Assessment: Patient has an upcoming left total knee revision with polyethylene exchange planned. She is stable from a cardiac standpoint with no angina, acute CHF symptoms, significant palpitations, lightheadedness/dizziness, or syncope. She is able to complete >4.0 METS without any chest pain or shortness of breath. Per Revised Cardiac Risk Index, considered moderate risk with 6.6% chance of MI, pulmonary edema, ventricular fibrillation, cardiac arrest, or complete heart block.  Therefore, based on ACC/AHA guidelines, patient would be at acceptable risk for the planned procedure without further cardiovascular testing. I will route this recommendation to the requesting party via Epic fax function.    Reproductive/Obstetrics                           Anesthesia Physical Anesthesia Plan  ASA: 3  Anesthesia Plan: General and Spinal   Post-op Pain Management:    Induction: Intravenous  PONV Risk Score and Plan: 3 and Propofol infusion, TIVA, Treatment may vary due to age or medical condition and Ondansetron  Airway Management Planned: Natural Airway and Nasal Cannula  Additional Equipment:   Intra-op Plan:   Post-operative Plan:   Informed Consent: I have reviewed the patients History and Physical, chart, labs and discussed the procedure including the risks, benefits and alternatives for the proposed anesthesia with the patient or authorized representative who has indicated his/her understanding and acceptance.  Plan Discussed with: CRNA  Anesthesia Plan Comments: (Plan for spinal and GA with natural airway, LMA/GETA backup.  Patient consented  for risks of anesthesia including but not limited to:  - adverse reactions to medications - damage to eyes, teeth, lips or other oral mucosa - nerve damage due to positioning  - sore throat or hoarseness - headache, bleeding, infection, nerve damage 2/2 spinal - damage to heart, brain, nerves, lungs, other parts of body or loss of life  Informed patient about role of CRNA in peri- and intra-operative care.  Patient voiced understanding.)        Anesthesia Quick Evaluation

## 2021-10-09 NOTE — Anesthesia Postprocedure Evaluation (Signed)
Anesthesia Post Note  Patient: Michele Moore  Procedure(s) Performed: LEFT TOTAL KNEE REVISION WITH POLYETHYLENE EXCHANGE. (Left: Knee)  Patient location during evaluation: PACU Anesthesia Type: Spinal Level of consciousness: awake and alert, oriented and patient cooperative Pain management: pain level controlled Vital Signs Assessment: post-procedure vital signs reviewed and stable Respiratory status: spontaneous breathing, nonlabored ventilation and respiratory function stable Cardiovascular status: blood pressure returned to baseline and stable Postop Assessment: adequate PO intake, no headache and spinal receding Anesthetic complications: no   No notable events documented.   Last Vitals:  Vitals:   10/09/21 1700 10/09/21 1715  BP: (!) 120/57 (!) 121/53  Pulse: 60 (!) 52  Resp: 12 14  Temp:  36.6 C  SpO2: 92% 100%    Last Pain:  Vitals:   10/09/21 1715  TempSrc:   PainSc: [place]

## 2021-10-09 NOTE — Interval H&P Note (Signed)
History and Physical Interval Note:  10/09/2021 12:03 PM  Michele Moore  has presented today for surgery, with the diagnosis of Chronic pain of left knee M25.562, G89.29 Status post revision of total knee, left Z96.652.  The various methods of treatment have been discussed with the patient and family. After consideration of risks, benefits and other options for treatment, the patient has consented to  Procedure(s): LEFT TOTAL KNEE REVISION WITH POLYETHYLENE EXCHANGE. (Left) as a surgical intervention.  The patient's history has been reviewed, patient examined, no change in status, stable for surgery.  I have reviewed the patient's chart and labs.  Questions were answered to the patient's satisfaction.     [place]

## 2021-10-09 NOTE — Transfer of Care (Signed)
Immediate Anesthesia Transfer of Care Note  Patient: Michele Moore  Procedure(s) Performed: LEFT TOTAL KNEE REVISION WITH POLYETHYLENE EXCHANGE. (Left: Knee)  Patient Location: PACU  Anesthesia Type:General  Level of Consciousness: drowsy  Airway & Oxygen Therapy: Patient Spontanous Breathing and Patient connected to face mask oxygen  Post-op Assessment: Report given to RN  Post vital signs: stable  Last Vitals:  Vitals Value Taken Time  BP    Temp    Pulse 83 10/09/21 1619  Resp 18 10/09/21 1619  SpO2 100 % 10/09/21 1619  Vitals shown include unvalidated device data.  Last Pain:  Vitals:   10/09/21 1037  TempSrc: Temporal  PainSc: 7          Complications: No notable events documented.

## 2021-10-09 NOTE — Op Note (Signed)
OPERATIVE NOTE  DATE OF SURGERY:  10/09/2021  PATIENT NAME:  Michele Moore   DOB: 07-16-1950  MRN: [id number]  PRE-OPERATIVE DIAGNOSIS: Left knee instability status post left total knee arthroplasty  POST-OPERATIVE DIAGNOSIS:  Same  PROCEDURE:  Left knee revision arthroplasty with polyethylene exchange  SURGEON:  Marciano Sequin. M.D.  ASSISTANT: Cassell Smiles, PA-C (present and scrubbed throughout the case, critical for assistance with exposure, retraction, instrumentation, and closure)  ANESTHESIA: spinal  ESTIMATED BLOOD LOSS: 100 mL  FLUIDS REPLACED: 800 mL of crystalloid  TOURNIQUET TIME: 68 minutes  DRAINS: 2 medium Hemovac drains  IMPLANTS UTILIZED: Stryker Triathlon X3 PS16 mm  polyethylene insert.  INDICATIONS FOR SURGERY: Michele Moore is a 71 y.o. year old female who had a remote history of a left total knee arthroplasty as per Dr. Clair Gulling K left.  Recently she has developed increased pain and instability of the knee, especially in mid flexion.  Radiographs demonstrated implants to be firmly attached without evidence of loosening..The patient had not seen any significant improvement despite conservative nonsurgical intervention. After discussion of the risks and benefits of surgical intervention, the patient expressed understanding of the risks benefits and agree with plans for left knee arthroplasty.   The risks, benefits, and alternatives were discussed at length including but not limited to the risks of infection, bleeding, nerve injury, stiffness, blood clots, the need for revision surgery, cardiopulmonary complications, among others, and they were willing to proceed.  PROCEDURE IN DETAIL: The patient was brought into the operating room and, after adequate spinal anesthesia was achieved, a tourniquet was placed on the patient's upper thigh. The patient's knee and leg were cleaned and prepped with alcohol and DuraPrep and draped in the usual sterile fashion. A "timeout" was  performed as per usual protocol. The lower extremity was exsanguinated using an Esmarch, and the tourniquet was inflated to 300 mmHg. An anterior longitudinal incision was made in line with the previous surgical scar followed by a standard medial parapatellar approach. The deep fibers of the medial collateral ligament were elevated in a subperiosteal fashion off of the medial flare of the tibia so as to maintain a continuous soft tissue sleeve.  Swabs were obtained for a stat Gram stain and culture.  The patella was subluxed laterally and the patellofemoral ligament was incised.  The medial and lateral gutters were reestablished using electrocautery.  Some overgrowth of the bone and adipose tissue was debrided from around the patella.  The patella was inspected and noted to be in good condition with no gross evidence of wear although some slight discoloration was noted.  A curved osteotome was used to lever the polyethylene out of the tibial tray.  The explanted polyethylene was 13 mm in thickness.  Tissue from around the tibial tray and around the femoral implant was debrided.  The bone implant interface was felt to be intact without evidence of loosening.  Osteophytes were debrided using a rongeur.  Debridement of soft tissue from the posterior recesses was performed using electrocautery and rongeurs.  Three-quarter inch curved osteotome was used to debride posterior osteophytes.  First a 14 and subsequently a 16 mm polyethylene trial was inserted.  The 16 mm trial allowed for full extension and excellent mediolateral soft tissue balancing throughout the full arc of motion.  Good patellar tracking was maintained.  The polyethylene trial was removed.  The tourniquet was deflated after total tourniquet time of 68 minutes.  Hemostasis was achieved using electrocautery.  The knee  was irrigated with  copious amounts of normal saline using pulsatile lavage followed by 450 ml of Surgiphor and then suctioned dry. 20 mL of  1.3% Exparel and 60 mL of 0.25% Marcaine in 40 mL of normal saline was injected along the posterior capsule, medial and lateral gutters, and along the arthrotomy site. A 16 mm Stryker Triathlon X3 PS polyethylene insert was inserted and the knee was placed through a range of motion with excellent mediolateral soft tissue balancing appreciated and excellent patellar tracking noted. 2 medium drains were placed in the wound bed and brought out through separate stab incisions. The medial parapatellar portion of the incision was reapproximated using interrupted sutures of #1 Vicryl. Subcutaneous tissue was approximated in layers using first #0 Vicryl followed #2-0 Vicryl. The skin was approximated with skin staples. A sterile dressing was applied.  The patient tolerated the procedure well and was transported to the recovery room in stable condition.    Izaak Sahr P. Holley Bouche., M.D.

## 2021-10-10 ENCOUNTER — Encounter: Payer: Self-pay | Admitting: Orthopedic Surgery

## 2021-10-10 MED ORDER — SODIUM CHLORIDE 0.9 % IV BOLUS
250.0000 mL | Freq: Once | INTRAVENOUS | Status: AC
  Administered 2021-10-10: 250 mL via INTRAVENOUS

## 2021-10-10 NOTE — Evaluation (Signed)
Physical Therapy Evaluation Patient Details Name: Michele Moore MRN: [id number] DOB: [birth year] Today's Date: 10/10/2021  History of Present Illness  Pt is a 71 y.o. female s/p L knee revision arthroplasty with polyethylene exchange 10/09/21 secondary to L knee instability s/p L TKA.  PMH includes L TKA 07/10/2009; L knee polyethylene exchange 01/02/2011; pt also reports h/o 2 R knee surgeries.  Clinical Impression  Prior to surgery, pt was modified independent ambulating household distances with rollator; lives alone (although reports her grandson is staying with her and works 3rd shift) in 1 level home with ramp to enter.  Pt reporting 6-7/10 L knee pain at rest and 8/10 with activity (nurse brought pain meds end of session).  Assist for L LE SLR required.  L knee AAROM to 70 degrees.  Currently pt is mod to max assist x2 with transfers and min to mod assist x2 to ambulate 5 feet with RW use (pt unsteady and verbalizing concerns of falling; also limited d/t L knee pain).  Pt would benefit from skilled PT to address noted impairments and functional limitations (see below for any additional details).  Upon hospital discharge, pt would benefit from SNF.    Recommendations for follow up therapy are one component of a multi-disciplinary discharge planning process, led by the attending physician.  Recommendations may be updated based on patient status, additional functional criteria and insurance authorization.  Follow Up Recommendations Skilled nursing-short term rehab (<3 hours/day) Can patient physically be transported by private vehicle: No    Assistance Recommended at Discharge Frequent or constant Supervision/Assistance  Patient can return home with the following  Two people to help with walking and/or transfers;A lot of help with bathing/dressing/bathroom;Assistance with cooking/housework;Assist for transportation;Help with stairs or ramp for entrance    Equipment Recommendations Rolling walker (2  wheels);BSC/3in1;Wheelchair (measurements PT);Wheelchair cushion (measurements PT)  Recommendations for Other Services  OT consult    Functional Status Assessment Patient has had a recent decline in their functional status and demonstrates the ability to make significant improvements in function in a reasonable and predictable amount of time.     Precautions / Restrictions Precautions Precautions: Knee Precaution Booklet Issued: Yes (comment) Restrictions Weight Bearing Restrictions: Yes LLE Weight Bearing: Weight bearing as tolerated      Mobility  Bed Mobility               General bed mobility comments: Deferred (pt on BSC beginning of session and in recliner end of session)    Transfers Overall transfer level: Needs assistance Equipment used: Rolling walker (2 wheels) Transfers: Sit to/from Stand Sit to Stand: Mod assist, Max assist, +2 physical assistance           General transfer comment: unable to stand 1st trial from [facility] with 2 assist; able to stand up to RW with mod to max assist x2 2nd trial; vc's for UE/LE placement and overall technique    Ambulation/Gait Ambulation/Gait assistance: Min assist, Mod assist, +2 physical assistance Gait Distance (Feet): 5 Feet Assistive device: Rolling walker (2 wheels)   Gait velocity: decreased     General Gait Details: antalgic; decreased stance time L LE; assist to steady  Stairs            Wheelchair Mobility    Modified Rankin (Stroke Patients Only)       Balance Overall balance assessment: Needs assistance Sitting-balance support: No upper extremity supported, Feet supported Sitting balance-Leahy Scale: Good Sitting balance - Comments: steady sitting reaching within BOS  Standing balance support: Bilateral upper extremity supported, During functional activity, Reliant on assistive device for balance Standing balance-Leahy Scale: Poor Standing balance comment: assist for balance for standing  activities                             Pertinent Vitals/Pain Pain Assessment Pain Assessment: 0-10 Pain Score: 8  Pain Location: L knee Pain Descriptors / Indicators: Grimacing, Guarding, Constant Pain Intervention(s): Limited activity within patient's tolerance, Monitored during session, Repositioned, Patient requesting pain meds-RN notified, RN gave pain meds during session, Other (comment) (polar care applied and activated) Vitals (HR and O2 on room air) stable and WFL throughout treatment session.    Home Living Family/patient expects to be discharged to:: Private residence Living Arrangements: Alone (Pt reports living alone but her grandson is staying with her (works 3rd shift)) Available Help at Discharge: Family;Available PRN/intermittently Type of Home: House Home Access: Ramped entrance       Home Layout: One level Home Equipment: Tub bench;Rollator (4 wheels);Toilet riser Additional Comments: Has children, grandson comes by to check on pt as needed throughout the week    Prior Function Prior Level of Function : Independent/Modified Independent             Mobility Comments: Modified independent with rollator household distances; uses power scooter at grocery store. 1 fall in past month d/t vertigo and R foot neuropathy. ADLs Comments: Per OT eval "IND with ADLs, assist with IADLs (not driving - relies on neighbor or medical transport)"     Hand Dominance   Dominant Hand: Right    Extremity/Trunk Assessment   Upper Extremity Assessment Upper Extremity Assessment: Generalized weakness    Lower Extremity Assessment Lower Extremity Assessment: LLE deficits/detail LLE Deficits / Details: assist to perform L LE SLR; at least 3/5 AROM ankle DF/PF; fair L quad set strength LLE: Unable to fully assess due to pain    Cervical / Trunk Assessment Cervical / Trunk Assessment: Normal  Communication   Communication: No difficulties  Cognition  Arousal/Alertness: Awake/alert Behavior During Therapy: WFL for tasks assessed/performed Overall Cognitive Status: Within Functional Limits for tasks assessed                                 General Comments: Pt requiring redirection for conversation.        General Comments      Exercises Total Joint Exercises Ankle Circles/Pumps: AROM, Strengthening, Both, 10 reps, Supine Quad Sets: AROM, Strengthening, Left, 10 reps, Supine Heel Slides: AAROM, Strengthening, Left, 10 reps, Supine (minimal L knee ROM d/t L knee pain) Hip ABduction/ADduction: AAROM, Strengthening, Left, 10 reps, Supine Straight Leg Raises: AAROM, Strengthening, Left, 10 reps, Supine Goniometric ROM: L knee AROM 5-70 degrees   Assessment/Plan    PT Assessment Patient needs continued PT services  PT Problem List Decreased strength;Decreased range of motion;Decreased activity tolerance;Decreased balance;Decreased mobility;Decreased knowledge of use of DME;Decreased knowledge of precautions;Pain;Decreased skin integrity       PT Treatment Interventions DME instruction;Gait training;Functional mobility training;Therapeutic activities;Therapeutic exercise;Balance training;Patient/family education    PT Goals (Current goals can be found in the Care Plan section)  Acute Rehab PT Goals Patient Stated Goal: to go home PT Goal Formulation: With patient Time For Goal Achievement: 10/24/21 Potential to Achieve Goals: Good    Frequency BID     Co-evaluation  AM-PAC PT "6 Clicks" Mobility  Outcome Measure Help needed turning from your back to your side while in a flat bed without using bedrails?: A Lot Help needed moving from lying on your back to sitting on the side of a flat bed without using bedrails?: A Lot Help needed moving to and from a bed to a chair (including a wheelchair)?: Total Help needed standing up from a chair using your arms (e.g., wheelchair or bedside chair)?:  Total Help needed to walk in hospital room?: Total Help needed climbing 3-5 steps with a railing? : Total 6 Click Score: 8    End of Session Equipment Utilized During Treatment: Gait belt Activity Tolerance: Patient limited by pain Patient left: in chair;with call bell/phone within reach;with chair alarm set;with SCD's reapplied;Other (comment) (B heels floating via towel rolls) Nurse Communication: Mobility status;Precautions;Weight bearing status;Patient requests pain meds PT Visit Diagnosis: Unsteadiness on feet (R26.81);Other abnormalities of gait and mobility (R26.89);Muscle weakness (generalized) (M62.81);History of falling (Z91.81);Pain Pain - Right/Left: Left Pain - part of body: Knee    Time: 0922-1000 PT Time Calculation (min) (ACUTE ONLY): 38 min   Charges:   PT Evaluation $PT Eval Low Complexity: 1 Low PT Treatments $Therapeutic Exercise: 8-22 mins $Therapeutic Activity: 8-22 mins       Leitha Bleak, PT 10/10/21, 2:58 PM

## 2021-10-10 NOTE — Progress Notes (Signed)
ORTHOPAEDICS PROGRESS NOTE  PATIENT NAME: Michele Moore DOB: [birth year]  MRN: [id number]  POD # 1: Left knee revision (polyethylene exchange)  Subjective: The patient reports moderate pain this morning. The patient denied any chest pain or shortness of breath.  She denied any symptoms. The patient states that she was not allowed to sit at bedside and dangle last night.  Objective: Vital signs in last 24 hours: Temp:  [97.3 F (36.3 C)-97.9 F (36.6 C)] 97.7 F (36.5 C) (08/17 0414) Pulse Rate:  [46-85] 46 (08/17 0702) Resp:  [11-18] 18 (08/17 0414) BP: (92-123)/(42-84) 102/54 (08/17 0702) SpO2:  [92 %-100 %] 93 % (08/17 0414) Weight:  [117.9 kg] 117.9 kg (08/16 1037)  Intake/Output from previous day: 08/16 0701 - 08/17 0700 In: 2270 [I.V.:1521.2; IV Piggyback:748.8] Out: 1545 [Urine:1425; Drains:20; Blood:100]  Recent Labs    10/09/21 1106  HGB 8.2*  HCT 24.0*  K 4.1  CL 107  BUN 42*  CREATININE 3.40*  GLUCOSE 78    EXAM General: Well-developed well-nourished female in no apparent discomfort. Lungs: clear to auscultation Cardiac: Slow, regular rate. Left lower extremity: Polar Care and Hemovac are in place and functioning.  Dressing is dry and intact.  The patient is unable to perform an independent straight leg raise.  Fair quadriceps firing.  Homans test is negative. Neurologic: Awake, alert, and oriented.  Sensory and motor function are intact.  Assessment: Left knee revision arthroplasty (polyethylene exchange)  Secondary diagnoses: Chronic gouty arthropathy Chronic kidney disease Essential hypertension Gastroesophageal reflux disease Hyperlipidemia Lumbar radiculopathy Migraines Osteoporosis Rheumatoid arthritis  Plan: Dr. Roland Rack (on-call physician last night) was apparently contacted about patient's low blood pressure and bradycardia.  She apparently received a fluid bolus.  She denied any symptoms last night.  She states that she was started on  metoprolol 2 weeks ago for an increase in her blood pressure.  Pressure has improved this morning but we will hold the metoprolol and monitor her vital signs.  Begin physical therapy and Occupational Therapy as per total knee arthroplasty rehab protocol. Discharge destination will be determined pending physical therapy evaluation. DVT Prophylaxis - Lovenox, TED hose, and SCDs  Hari Casaus P. Holley Bouche M.D.

## 2021-10-10 NOTE — Progress Notes (Signed)
25m normal saline bolus ordered  10/10/21 0514  Assess: MEWS Score  BP (!) 98/49  MAP (mmHg) (!) 61  Pulse Rate (!) 50  Assess: MEWS Score  MEWS Temp 0  MEWS Systolic 1  MEWS Pulse 1  MEWS RR 0  MEWS LOC 0  MEWS Score 2  MEWS Score Color Yellow  Assess: if the MEWS score is Yellow or Red  Were vital signs taken at a resting state? Yes  Focused Assessment No change from prior assessment  Does the patient meet 2 or more of the SIRS criteria? No  MEWS guidelines implemented *See Row Information* Yes  Take Vital Signs  Increase Vital Sign Frequency  Yellow: Q 2hr X 2 then Q 4hr X 2, if remains yellow, continue Q 4hrs  Escalate  MEWS: Escalate Yellow: discuss with charge nurse/RN and consider discussing with provider and RRT  Notify: Charge Nurse/RN  Name of Charge Nurse/RN Notified Dorie, RN  Date Charge Nurse/RN Notified 10/10/21  Time Charge Nurse/RN Notified 0530  Notify: Provider  Provider Name/Title JMilagros Evener MD  Date Provider Notified 10/10/21  Time Provider Notified [phone] Method of Notification Call  Notification Reason Other (Comment) (MEWS Yellow)  Provider response See new orders  Date of Provider Response 10/10/21  Time of Provider Response 0537  Notify: Rapid Response  Name of Rapid Response RN Notified RRT not needed at this time  Document  Patient Outcome Stabilized after interventions  Progress note created (see row info) Yes  Assess: SIRS CRITERIA  SIRS Temperature  0  SIRS Pulse 0  SIRS Respirations  0  SIRS WBC 1  SIRS Score Sum  1

## 2021-10-10 NOTE — Evaluation (Addendum)
Occupational Therapy Evaluation Patient Details Name: Michele Moore MRN: [id number] DOB: 06/19/1950 Today's Date: 10/10/2021   History of Present Illness Michele Moore is a 71 y/o female s/p revision L TKA on 10/09/21.   Clinical Impression   Patient seen for OT evaluation. Pt presenting with L knee pain, decreased endurance, decreased strength, and impaired balance impacting safety and independence in ADLs. At baseline, pt was independent with ADLs, received assistance for IADLs (driving, groceries), and was Mod I for functional mobility using a rollator. Patient currently functioning at [facility] A for LB dressing using AE and set up A for grooming. Pt was educated on precautions including WBAT RLE, no pillow under L knee, and polar care system. OT evaluation was limited 2/2 L knee pain. Pt would benefit from further OOB assessment at next session. Patient will benefit from acute OT while in this hospital as well as STR upon discharge to increase overall independence in the areas of ADLs and functional mobility prior to return home.      Recommendations for follow up therapy are one component of a multi-disciplinary discharge planning process, led by the attending physician.  Recommendations may be updated based on patient status, additional functional criteria and insurance authorization.   Follow Up Recommendations  Skilled nursing-short term rehab (<3 hours/day)    Assistance Recommended at Discharge Frequent or constant Supervision/Assistance  Patient can return home with the following A lot of help with walking and/or transfers;A little help with bathing/dressing/bathroom;Assistance with cooking/housework;Assist for transportation;Help with stairs or ramp for entrance    Functional Status Assessment  Patient has had a recent decline in their functional status and demonstrates the ability to make significant improvements in function in a reasonable and predictable amount of time.  Equipment  Recommendations  Other (comment) (defer to next venue of care, could benefit from [facility])   Recommendations for Other Services       Precautions / Restrictions Precautions Precautions: Knee Restrictions Weight Bearing Restrictions: Yes LLE Weight Bearing: Weight bearing as tolerated      Mobility Bed Mobility               General bed mobility comments: pt received in recliner    Transfers                   General transfer comment: further OOB mobility deferred 2/2 L knee pain      Balance Overall balance assessment: Needs assistance Sitting-balance support: Feet supported Sitting balance-Leahy Scale: Good Sitting balance - Comments: good dynamic sitting balance during dressing tasks       Standing balance comment: further OOB mobility deferred 2/2 L knee pain                           ADL either performed or assessed with clinical judgement   ADL Overall ADL's : Needs assistance/impaired     Grooming: Wash/dry face;Oral care;Set up;Sitting;Supervision/safety Grooming Details (indicate cue type and reason): Pt completed grooming tasks while sitting in recliner             Lower Body Dressing: Cueing for sequencing;With adaptive equipment;Sitting/lateral leans;Minimal assistance Lower Body Dressing Details (indicate cue type and reason): Pt completed LB dressing using reacher with Min A and VC for sequencing steps 2/2 learning new dressing technique with AE                     Vision Baseline Vision/History: 3 Glaucoma;4  Cataracts;1 Wears glasses Patient Visual Report: No change from baseline Additional Comments: wears reading glasses, pt endorses having "cataracts" and "glaucoma"     Perception     Praxis      Pertinent Vitals/Pain Pain Assessment Pain Assessment: 0-10  Pain Score: 10-Worst pain ever (6/10 before activity, 10/10 during LB dressing) Pain Location: L knee Pain Descriptors / Indicators: Grimacing,  Guarding, Constant Pain Intervention(s): Limited activity within patient's tolerance, Monitored during session, Premedicated before session, Repositioned, Ice applied     Hand Dominance Right   Extremity/Trunk Assessment Upper Extremity Assessment Upper Extremity Assessment: Generalized weakness   Lower Extremity Assessment Lower Extremity Assessment: LLE deficits/detail LLE Deficits / Details: dressing to L knee from TKA LLE: Unable to fully assess due to pain   Cervical / Trunk Assessment Cervical / Trunk Assessment: Normal   Communication Communication Communication: No difficulties   Cognition Arousal/Alertness: Awake/alert Behavior During Therapy: WFL for tasks assessed/performed Overall Cognitive Status: Within Functional Limits for tasks assessed                                 General Comments: A&Ox4, very tangential with conversation, VC for redirection back to task     General Comments       Exercises     Shoulder Instructions      Home Living Family/patient expects to be discharged to:: Private residence Living Arrangements: Alone Available Help at Discharge: Family;Available PRN/intermittently Type of Home: House Home Access: Ramped entrance     Home Layout: One level     Bathroom Shower/Tub: Teacher, early years/pre: Standard     Home Equipment: Rollator (4 wheels);Tub bench   Additional Comments: Has children, grandson comes by to check on pt as needed throughout the week      Prior Functioning/Environment Prior Level of Function : Independent/Modified Independent             Mobility Comments: Mod I with rollator household distances, uses power scooter at grocery store. Reports several "near" falls where she catches herself on furniture/wall. ADLs Comments: IND with ADLs, assist with IADLs (not driving - relies on neighbor or medical transport)        OT Problem List: Decreased strength;Decreased knowledge of  use of DME or AE;Decreased range of motion;Obesity;Decreased activity tolerance;Impaired balance (sitting and/or standing);Pain      OT Treatment/Interventions: Self-care/ADL training;Therapeutic exercise;Patient/family education;Balance training;Energy conservation;Therapeutic activities;DME and/or AE instruction    OT Goals(Current goals can be found in the care plan section) Acute Rehab OT Goals Patient Stated Goal: get stronger OT Goal Formulation: With patient Time For Goal Achievement: 10/24/21 Potential to Achieve Goals: Good ADL Goals Pt Will Perform Grooming: standing;with modified independence Pt Will Perform Upper Body Dressing: sitting;with modified independence Pt Will Perform Lower Body Dressing: with adaptive equipment;sit to/from stand;with modified independence Pt Will Transfer to Toilet: ambulating;bedside commode;with modified independence Pt Will Perform Toileting - Clothing Manipulation and hygiene: sit to/from stand;with modified independence  OT Frequency: Min 2X/week    Co-evaluation              AM-PAC OT "6 Clicks" Daily Activity     Outcome Measure Help from another person eating meals?: None Help from another person taking care of personal grooming?: A Little Help from another person toileting, which includes using toliet, bedpan, or urinal?: A Lot Help from another person bathing (including washing, rinsing, drying)?: A Lot Help from another  person to put on and taking off regular upper body clothing?: A Little Help from another person to put on and taking off regular lower body clothing?: A Lot 6 Click Score: 16   End of Session Nurse Communication: Patient requests pain meds  Activity Tolerance: Patient limited by pain Patient left: in chair;with call bell/phone within reach;with chair alarm set  OT Visit Diagnosis: Other abnormalities of gait and mobility (R26.89);Muscle weakness (generalized) (M62.81);Pain Pain - Right/Left: Left Pain - part  of body: Knee                Time: 2003-7944 OT Time Calculation (min): 36 min Charges:  OT General Charges $OT Visit: 1 Visit OT Evaluation $OT Eval Low Complexity: 1 Low OT Treatments $Self Care/Home Management : 8-22 mins  [facility] MS, OTR/L ascom [phone]  10/10/21, 1:22 PM

## 2021-10-10 NOTE — Progress Notes (Signed)
Physical Therapy Treatment Patient Details Name: Michele Moore MRN: [id number] DOB: 06-09-1950 Today's Date: 10/10/2021   History of Present Illness Pt is a 71 y.o. female s/p L knee revision arthroplasty with polyethylene exchange 10/09/21 secondary to L knee instability s/p L TKA.  PMH includes L TKA 07/10/2009; L knee polyethylene exchange 01/02/2011; pt also reports h/o 2 R knee surgeries.    PT Comments    Pt sitting on BSC upon PT arrival; agreeable to therapy session.  During session pt max assist to stand from [facility] up to RW; min to mod assist to ambulate 5 feet with RW use; and min assist to lay back down in bed end of session.  Tolerated L LE ex's in sitting fairly well with assist as needed.  Limited activities though d/t pt fatigue.  Will continue to focus on strengthening, knee ROM, and progressive functional mobility during hospitalization.  TOC notified of SNF recommendation.    Recommendations for follow up therapy are one component of a multi-disciplinary discharge planning process, led by the attending physician.  Recommendations may be updated based on patient status, additional functional criteria and insurance authorization.  Follow Up Recommendations  Skilled nursing-short term rehab (<3 hours/day) Can patient physically be transported by private vehicle: No   Assistance Recommended at Discharge Frequent or constant Supervision/Assistance  Patient can return home with the following A lot of help with bathing/dressing/bathroom;Assistance with cooking/housework;Assist for transportation;Help with stairs or ramp for entrance;A lot of help with walking and/or transfers   Equipment Recommendations  Rolling walker (2 wheels);BSC/3in1;Wheelchair (measurements PT);Wheelchair cushion (measurements PT)    Recommendations for Other Services OT consult     Precautions / Restrictions Precautions Precautions: Knee Precaution Booklet Issued: Yes (comment) Restrictions Weight Bearing  Restrictions: Yes LLE Weight Bearing: Weight bearing as tolerated     Mobility  Bed Mobility Overal bed mobility: Needs Assistance Bed Mobility: Sit to Supine       Sit to supine: Min assist, HOB elevated   General bed mobility comments: assist for L LE; vc's for technique; use of bed rail    Transfers Overall transfer level: Needs assistance Equipment used: Rolling walker (2 wheels) Transfers: Sit to/from Stand Sit to Stand: Max assist           General transfer comment: max assist to stand from [facility] with cueing for UE/LE placement and overall technique    Ambulation/Gait Ambulation/Gait assistance: Min assist, Mod assist Gait Distance (Feet): 5 Feet Assistive device: Rolling walker (2 wheels)   Gait velocity: decreased     General Gait Details: antalgic; decreased stance time L LE; assist to steady   Chief Strategy Officer    Modified Rankin (Stroke Patients Only)       Balance Overall balance assessment: Needs assistance Sitting-balance support: No upper extremity supported, Feet supported Sitting balance-Leahy Scale: Good Sitting balance - Comments: steady sitting reaching within BOS   Standing balance support: Bilateral upper extremity supported, During functional activity, Reliant on assistive device for balance Standing balance-Leahy Scale: Poor Standing balance comment: assist for balance for standing activities                            Cognition Arousal/Alertness: Awake/alert Behavior During Therapy: WFL for tasks assessed/performed Overall Cognitive Status: Within Functional Limits for tasks assessed  General Comments: Pt requiring redirection for conversation.        Exercises Total Joint Exercises Long Arc Quad: AAROM, Strengthening, Left, 10 reps, Seated Knee Flexion: AROM, Strengthening, Left, 10 reps, Seated (wash cloth under pt's foot to allow for  easier foot sliding on floor) General Exercises - Lower Extremity Hip Flexion/Marching: AAROM, Strengthening, Left, 10 reps, Seated    General Comments        Pertinent Vitals/Pain Pain Assessment Pain Assessment: 0-10 Pain Score: 6  Pain Location: L knee Pain Descriptors / Indicators: Grimacing, Guarding, Constant Pain Intervention(s): Limited activity within patient's tolerance, Monitored during session, Premedicated before session, Repositioned, Other (comment) (polar care applied) Vitals (HR and O2 on room air) stable and WFL throughout treatment session.    Home Living Family/patient expects to be discharged to:: Private residence Living Arrangements: Alone (Pt reports living alone but her grandson is staying with her (works 3rd shift)) Available Help at Discharge: Family;Available PRN/intermittently Type of Home: House Home Access: Ramped entrance       Home Layout: One level Home Equipment: Tub bench;Rollator (4 wheels);Toilet riser      Prior Function            PT Goals (current goals can now be found in the care plan section) Acute Rehab PT Goals Patient Stated Goal: to go home PT Goal Formulation: With patient Time For Goal Achievement: 10/24/21 Potential to Achieve Goals: Good Progress towards PT goals: Progressing toward goals    Frequency    BID      PT Plan Current plan remains appropriate    Co-evaluation              AM-PAC PT "6 Clicks" Mobility   Outcome Measure  Help needed turning from your back to your side while in a flat bed without using bedrails?: A Lot Help needed moving from lying on your back to sitting on the side of a flat bed without using bedrails?: A Lot Help needed moving to and from a bed to a chair (including a wheelchair)?: A Lot Help needed standing up from a chair using your arms (e.g., wheelchair or bedside chair)?: A Lot Help needed to walk in hospital room?: Total Help needed climbing 3-5 steps with a  railing? : Total 6 Click Score: 10    End of Session Equipment Utilized During Treatment: Gait belt Activity Tolerance: Patient limited by pain;Patient limited by fatigue Patient left: in bed;with call bell/phone within reach;with bed alarm set;with SCD's reapplied;Other (comment) (B heels floating via towel rolls; polar care in place) Nurse Communication: Mobility status;Precautions;Weight bearing status PT Visit Diagnosis: Unsteadiness on feet (R26.81);Other abnormalities of gait and mobility (R26.89);Muscle weakness (generalized) (M62.81);History of falling (Z91.81);Pain Pain - Right/Left: Left Pain - part of body: Knee     Time: 8338-2505 PT Time Calculation (min) (ACUTE ONLY): 23 min  Charges:  $Therapeutic Exercise: 8-22 mins $Therapeutic Activity: 8-22 mins                    Leitha Bleak, PT 10/10/21, 5:34 PM

## 2021-10-10 NOTE — Plan of Care (Signed)
Assumed care of patient at 2300. Aox4. Pain level 4/10, no request for pain medication at this time. Dressing C/D/I. Polar care on. Hemovac intact. Plan of care reviewed with patient. Call bell within reach.    Problem: Education: Goal: Knowledge of the prescribed therapeutic regimen will improve Outcome: Progressing Goal: Individualized Educational Video(s) Outcome: Progressing   Problem: Activity: Goal: Ability to avoid complications of mobility impairment will improve Outcome: Progressing Goal: Range of joint motion will improve Outcome: Progressing   Problem: Clinical Measurements: Goal: Postoperative complications will be avoided or minimized Outcome: Progressing   Problem: Pain Management: Goal: Pain level will decrease with appropriate interventions Outcome: Progressing   Problem: Skin Integrity: Goal: Will show signs of wound healing Outcome: Progressing   Problem: Education: Goal: Knowledge of General Education information will improve Description: Including pain rating scale, medication(s)/side effects and non-pharmacologic comfort measures Outcome: Progressing   Problem: Health Behavior/Discharge Planning: Goal: Ability to manage health-related needs will improve Outcome: Progressing   Problem: Clinical Measurements: Goal: Ability to maintain clinical measurements within normal limits will improve Outcome: Progressing Goal: Will remain free from infection Outcome: Progressing Goal: Diagnostic test results will improve Outcome: Progressing Goal: Respiratory complications will improve Outcome: Progressing Goal: Cardiovascular complication will be avoided Outcome: Progressing   Problem: Activity: Goal: Risk for activity intolerance will decrease Outcome: Progressing   Problem: Nutrition: Goal: Adequate nutrition will be maintained Outcome: Progressing   Problem: Coping: Goal: Level of anxiety will decrease Outcome: Progressing   Problem:  Elimination: Goal: Will not experience complications related to bowel motility Outcome: Progressing Goal: Will not experience complications related to urinary retention Outcome: Progressing   Problem: Pain Managment: Goal: General experience of comfort will improve Outcome: Progressing   Problem: Safety: Goal: Ability to remain free from injury will improve Outcome: Progressing   Problem: Skin Integrity: Goal: Risk for impaired skin integrity will decrease Outcome: Progressing

## 2021-10-10 NOTE — Discharge Summary (Cosign Needed Addendum)
Physician Discharge Summary  Patient ID: FAIRY ASHLOCK MRN: [id number] DOB/AGE: 04/07/1950 71 y.o.  Admit date: 10/09/2021 Discharge date: 10/14/2021  Admission Diagnoses:  S/P revision of total knee [Z96.659]  Surgeries:Procedure(s):  Left knee revision arthroplasty with polyethylene exchange   SURGEON:  Marciano Sequin. M.D.   ASSISTANT: Cassell Smiles, PA-C (present and scrubbed throughout the case, critical for assistance with exposure, retraction, instrumentation, and closure)   ANESTHESIA: spinal   ESTIMATED BLOOD LOSS: 100 mL   FLUIDS REPLACED: 800 mL of crystalloid   TOURNIQUET TIME: 68 minutes   DRAINS: 2 medium Hemovac drains   IMPLANTS UTILIZED: [employer] Triathlon X3 PS16 mm  polyethylene insert.  Discharge Diagnoses: Patient Active Problem List   Diagnosis Date Noted   S/P revision of total knee 10/09/2021   Chronic instability of left knee 02/12/2021   Long term methotrexate user 01/03/2021   Stage 3b chronic kidney disease ([place]) 01/03/2021   Piriformis syndrome of left side 11/15/2020   Normocytic anemia 04/22/2020   Lumbar spondylosis 02/16/2020   Arthritis of left knee 11/24/2019   Left hip pain 08/08/2019   Primary osteoarthritis of left hip 08/08/2019   Non-seasonal allergic rhinitis 04/12/2019   Bursitis of left shoulder 03/24/2019   Benign essential hypertension 03/06/2019   Proteinuria 03/06/2019   Trochanteric bursitis of left hip 10/04/2018   SI joint arthritis (left) 10/04/2018   Exertional chest pain 09/30/2018   CKD (chronic kidney disease), stage III ([place]) 09/30/2018   Psoriatic arthritis ([place]) 04/23/2018   Trigger ring finger of left hand 04/14/2018   Female pelvic pain 01/20/2018   History of revision of total knee arthroplasty 01/20/2018   Iron deficiency anemia    Heme + stool    Acute gastritis without hemorrhage    Gastric polyps    Benign neoplasm of ascending colon    Benign neoplasm of transverse colon    Polyp of sigmoid  colon    Hypertension 10/01/2017   Hypothyroidism 10/01/2017   Gastroesophageal reflux disease 10/01/2017   Hypokalemia 10/01/2017   Mixed hyperlipidemia 10/01/2017   Reactive airway disease 10/01/2017   Bradycardia 08/24/2017   Severe obesity (BMI 35.0-35.9 with comorbidity) ([place]) 08/18/2017   Chronic gouty arthropathy without tophi 06/25/2017   Encounter for long-term (current) use of high-risk medication 06/25/2017   Positive ANA (antinuclear antibody) 06/17/2017   Cervical facet syndrome 06/16/2017   Primary osteoarthritis of right shoulder 06/16/2017   Sprain of anterior talofibular ligament of right ankle 06/16/2017   Lumbar radiculopathy 04/21/2017   Lumbar degenerative disc disease 04/21/2017   Chronic pain syndrome 04/21/2017   Spinal stenosis, lumbar region, with neurogenic claudication 04/21/2017   SS-A antibody positive 03/05/2017   Chronic knee pain after total replacement of left knee joint 08/03/2015   DOE (dyspnea on exertion) 12/28/2013    Past Medical History:  Diagnosis Date   (HFpEF) heart failure with preserved ejection fraction ([place]) 01/13/2014   a.) TTE 01/13/2014: EF 45%, apical HK, mod LVH, mild MAC, mild LAE/RVE, mod MR/TR/PR; b.) TTE 10/01/2018: EF 55-60%, mild MVH, mild LAE, triv MR/TR, G1DD   Allergy    ANA positive    Anemia of chronic renal failure, stage 3 (moderate), unspecified whether stage 3a or 3b CKD (HCC)    Aortic atherosclerosis (HCC)    Asthma    Cataract    Chronic pain syndrome    a.) followed by [facility] Holley Raring, MD)   Chronic, continuous use of opioids    CKD (chronic kidney  disease), stage III ([place])    Complication of anesthesia    a.) anesthesia awareness/premature emergence during colonoscopy   DDD (degenerative disc disease), lumbar    Dyspnea on exertion    Dysrhythmia    IRREGULAR HEART BEAT   Family history of adverse reaction to anesthesia    sister difficult to put to sleep   GERD (gastroesophageal reflux  disease)    Glaucoma    Gout    H/O tooth extraction    all lower teeth 1/19   Hemorrhoid    History of hiatal hernia    History of kidney stones    Hypertension    Hypothyroidism    Implantable loop recorder present 08/22/2021   a.) s/p implantation of [employer] LINQ Reveal ILR; serial #: [id number]   Long term current use of immunosuppressive drug    a.) MTX + apremilast   Migraines    Mixed hyperlipidemia    Multiple gastric ulcers    Osteoarthritis    Psoriasis    Psoriatic arthritis ([place])    a.) on apremilast   Rheumatoid arthritis ([place])    a.) on MTX   SS-A antibody positive    Wears dentures    partial upper and lower     Transfusion:    Consultants (if any):   Discharged Condition: Improved  Hospital Course: Michele Moore is an 71 y.o. female who was admitted 10/09/2021 with a diagnosis of instability of left total knee arthroplasty and went to the operating room on 10/09/2021 and underwent left knee revision arthroplasty with polyethylene exchange. The patient received perioperative antibiotics for prophylaxis (see below). The patient tolerated the procedure well and was transported to PACU in stable condition. After meeting PACU criteria, the patient was subsequently transferred to the Orthopaedics/Rehabilitation unit.   The patient received DVT prophylaxis in the form of early mobilization, Lovenox, TED hose, and SCDs . A sacral pad had been placed and heels were elevated off of the bed with rolled towels in order to protect skin integrity. Foley catheter was discontinued on postoperative day #0. Wound drains were discontinued on postoperative day #1. The surgical incision was healing well without signs of infection.  Physical therapy was initiated postoperatively for transfers, gait training, and strengthening. Occupational therapy was initiated for activities of daily living and evaluation for assisted devices. Rehabilitation goals were reviewed in detail with the  patient. The patient made steady progress with physical therapy and physical therapy recommended discharge to Rehab.   The patient had a hemoglobin of 6.3 on postop day 2 and received 1 unit of transfused blood.  Her blood pressure stabilized and her hemoglobin was at 7.0 the following day.  Hemoglobin on 10/13/21 dropped down to 6.9 and she underwent an additional transfusion of one unit, Hg on 10/14/21 increased to 9.2.  She was able to transfer and ambulate better with physical therapy.  She was ready to go to rehab.  The patient achieved the preliminary goals of this hospitalization and was felt to be medically and orthopaedically appropriate for discharge.  She was given perioperative antibiotics:  Anti-infectives (From admission, onward)    Start     Dose/Rate Route Frequency Ordered Stop   10/09/21 1930  ceFAZolin (ANCEF) IVPB 2g/100 mL premix        2 g 200 mL/hr over 30 Minutes Intravenous Every 6 hours 10/09/21 1806 10/10/21 0452   10/09/21 0600  ceFAZolin (ANCEF) IVPB 3g/100 mL premix        3 g  200 mL/hr over 30 Minutes Intravenous On call to O.R. 10/08/21 2305 10/09/21 1323     .  Recent vital signs:  Vitals:   10/13/21 2219 10/14/21 0300  BP: 136/70 123/68  Pulse: 62 (!) 59  Resp:  18  Temp: 98.3 F (36.8 C) 98.1 F (36.7 C)  SpO2: 99% 97%    Recent laboratory studies:  Recent Labs    10/11/21 1636 10/12/21 0638 10/13/21 0757 10/13/21 1751 10/14/21 0319 10/14/21 1121  WBC  --   --   --   --  8.7  --   HGB 7.3* 7.0* 6.9* 8.8* 8.1* 9.2*  HCT 22.4* 21.5*  --  27.0* 25.0*  --   PLT  --   --   --   --  180  --   K  --  4.7  --   --  3.4*  --   CL  --  107  --   --  101  --   CO2  --  25  --   --  27  --   BUN  --  49*  --   --  42*  --   CREATININE  --  2.14*  --   --  1.64*  --   GLUCOSE  --  137*  --   --  190*  --   CALCIUM  --  9.0  --   --  9.5  --     Diagnostic Studies: DG Knee Left Port  Result Date: 10/09/2021 CLINICAL DATA:  Postop. EXAM:  PORTABLE LEFT KNEE - 1-2 VIEW COMPARISON:  OR fluoroscopy, 05/29/2021. FINDINGS: Postsurgical changes of LEFT knee arthroplasty in anatomic alignment. No periprosthetic lucency or fracture. Surgical drain and overlying cutaneous staples. IMPRESSION: Postsurgical changes of LEFT total knee arthroplasty without radiographic evidence of acute complication. Electronically Signed   By: Michaelle Birks M.D.   On: 10/09/2021 16:36   CUP PACEART REMOTE DEVICE CHECK  Result Date: 09/27/2021 ILR summary report received. Battery status OK. Normal device function. No new tachy, brady, or1 false pause episodes. 43 false  AF episodes addressed as alerts. Monthly summary reports and ROV/PRN 12 symptom activation's, addressed as alerts correlation with tachy events.  Cozaar was discontinued and Metoprolol 35m bid was started.  LA   Discharge Medications:   Allergies as of 10/14/2021       Reactions   Ampicillin Swelling   Penicillins Anaphylaxis, Swelling   IgE = 10 (11/05/2017) Has patient had a PCN reaction causing immediate rash, facial/tongue/throat swelling, SOB or lightheadedness with hypotension: Yes Has patient had a PCN reaction causing severe rash involving mucus membranes or skin necrosis: No Has patient had a PCN reaction that required hospitalization: No Has patient had a PCN reaction occurring within the last 10 years: Yes If all of the above answers are "NO", then may proceed with Cephalosporin use.   Vancomycin Rash   Other reaction(s): Other (see comments)   Vibramycin [doxycycline Calcium] Rash        Medication List     STOP taking these medications    HYDROcodone-acetaminophen 7.5-325 MG tablet Commonly known as: Norco       TAKE these medications    acetaminophen 500 MG tablet Commonly known as: TYLENOL Take 500 mg by mouth every 6 (six) hours as needed.   acetaminophen 650 MG CR tablet Commonly known as: TYLENOL Take 650 mg by mouth every 8 (eight) hours as needed for  pain.   albuterol 108 (  90 Base) MCG/ACT inhaler Commonly known as: VENTOLIN HFA Inhale 2 puffs into the lungs every 6 (six) hours as needed for wheezing or shortness of breath.   allopurinol 100 MG tablet Commonly known as: ZYLOPRIM Take 200 mg by mouth every morning.   betamethasone dipropionate 0.05 % cream APPLY TO PSORIASIS ON HANDS TWICE DAILY ONLY. AVOID FACE, GROIN AND AXILLA   betamethasone valerate 0.1 % cream Commonly known as: VALISONE Apply topically 2 (two) times daily as needed (Rash).   calcipotriene 0.005 % cream Commonly known as: DOVONOX APPLY TOPICALLY TO PSORIASIS AREAS ON LEGS AND FEET ONCE OR TWICE DAILY   clobetasol ointment 0.05 % Commonly known as: TEMOVATE Apply topically 2 (two) times daily.   diclofenac sodium 1 % Gel Commonly known as: VOLTAREN Apply 2 g topically 3 (three) times daily.   enoxaparin 40 MG/0.4ML injection Commonly known as: LOVENOX Inject 0.4 mLs (40 mg total) into the skin daily for 14 days.   Farxiga 10 MG Tabs tablet Generic drug: dapagliflozin propanediol Take 10 mg by mouth every morning.   ferrous sulfate 325 (65 FE) MG tablet Take 325 mg by mouth daily with breakfast.   furosemide 40 MG tablet Commonly known as: LASIX Take 40 mg by mouth daily.   gabapentin 600 MG tablet Commonly known as: Neurontin Take 1 tablet (600 mg total) by mouth at bedtime.   hydrALAZINE 50 MG tablet Commonly known as: APRESOLINE Take 1 tablet by mouth twice daily   ketoconazole 2 % cream Commonly known as: NIZORAL Apply topically daily as needed.   levothyroxine 175 MCG tablet Commonly known as: SYNTHROID Take 1 tablet (175 mcg total) by mouth daily.   meclizine 12.5 MG tablet Commonly known as: ANTIVERT TAKE 1 TABLET(12.5 MG) BY MOUTH THREE TIMES DAILY AS NEEDED FOR DIZZINESS   methotrexate 2.5 MG tablet Commonly known as: RHEUMATREX Take 7.5 mg by mouth every Friday. Take 3 tablets (2.45m x3) by mouth once weekly    metoprolol tartrate 25 MG tablet Commonly known as: LOPRESSOR Take 1 tablet (25 mg total) by mouth 2 (two) times daily.   montelukast 10 MG tablet Commonly known as: SINGULAIR TAKE 1 TABLET(10 MG) BY MOUTH AT BEDTIME   mupirocin ointment 2 % Commonly known as: Bactroban Apply 1 application topically 2 (two) times daily.   nystatin cream Commonly known as: MYCOSTATIN Apply 1 Application topically 2 (two) times daily.   omega-3 acid ethyl esters 1 g capsule Commonly known as: LOVAZA Take 1 capsule (1 g total) by mouth 2 (two) times daily.   omeprazole 40 MG capsule Commonly known as: PRILOSEC TAKE 1 CAPSULE(40 MG) BY MOUTH DAILY   ONE-A-DAY WOMENS FORMULA PO Take 1 tablet by mouth daily.   Otezla 30 MG Tabs Generic drug: Apremilast Take 1 tablet by mouth daily.   oxyCODONE 5 MG immediate release tablet Commonly known as: Oxy IR/ROXICODONE Take 1 tablet (5 mg total) by mouth every 4 (four) hours as needed for moderate pain (pain score 4-6).   potassium chloride SA 20 MEQ tablet Commonly known as: KLOR-CON M Take 0.5 tablets (10 mEq total) by mouth daily.   SYSTANE OP Place 1 drop into both eyes daily as needed (dry eyes).   tiZANidine 4 MG tablet Commonly known as: ZANAFLEX Take 1 tablet (4 mg total) by mouth at bedtime.   traMADol 50 MG tablet Commonly known as: ULTRAM Take 1-2 tablets (50-100 mg total) by mouth every 4 (four) hours as needed for moderate pain.  triamcinolone cream 0.1 % Commonly known as: KENALOG Apply 1 application topically 2 (two) times daily.   triamterene-hydrochlorothiazide 75-50 MG tablet Commonly known as: MAXZIDE Take 1 tablet by mouth daily.   Vitamin D3 50 MCG (2000 UT) Tabs Take 2,000 Units by mouth daily.               Durable Medical Equipment  (From admission, onward)           Start     Ordered   10/09/21 1807  DME Walker rolling  Once       Question:  Patient needs a walker to treat with the following  condition  Answer:  Total knee replacement status   10/09/21 1806   10/09/21 1807  DME Bedside commode  Once       Question:  Patient needs a bedside commode to treat with the following condition  Answer:  Total knee replacement status   10/09/21 1806           Disposition: Skilled nursing facility   Follow-up Information     Reche Dixon, PA-C Follow up on 10/24/2021.   Specialty: Orthopedic Surgery Why: at 11:15am Contact information: [place] [address] [phone]         Dereck Leep, MD Follow up on 11/21/2021.   Specialty: Orthopedic Surgery Why: at 1:30pm Contact information: [place] [address] [phone]                Raquel James, PA-C 10/14/2021, 12:19 PM

## 2021-10-11 LAB — BASIC METABOLIC PANEL
Anion gap: 6 (ref 5–15)
BUN: 52 mg/dL — ABNORMAL HIGH (ref 8–23)
CO2: 22 mmol/L (ref 22–32)
Calcium: 9.1 mg/dL (ref 8.9–10.3)
Chloride: 106 mmol/L (ref 98–111)
Creatinine, Ser: 2.71 mg/dL — ABNORMAL HIGH (ref 0.44–1.00)
GFR, Estimated: 18 mL/min — ABNORMAL LOW (ref >60)
Glucose, Bld: 159 mg/dL — ABNORMAL HIGH (ref 70–99)
Potassium: 4.1 mmol/L (ref 3.5–5.1)
Sodium: 134 mmol/L — ABNORMAL LOW (ref 135–145)

## 2021-10-11 LAB — PREPARE RBC (CROSSMATCH)

## 2021-10-11 LAB — HEMOGLOBIN AND HEMATOCRIT, BLOOD
HCT: 19.7 % — ABNORMAL LOW (ref 36.0–46.0)
HCT: 22.4 % — ABNORMAL LOW (ref 36.0–46.0)
Hemoglobin: 6.3 g/dL — ABNORMAL LOW (ref 12.0–15.0)
Hemoglobin: 7.3 g/dL — ABNORMAL LOW (ref 12.0–15.0)

## 2021-10-11 MED ORDER — SODIUM CHLORIDE 0.9 % IV BOLUS
500.0000 mL | Freq: Once | INTRAVENOUS | Status: AC
  Administered 2021-10-11: 500 mL via INTRAVENOUS

## 2021-10-11 MED ORDER — ENOXAPARIN SODIUM 30 MG/0.3ML IJ SOSY
30.0000 mg | PREFILLED_SYRINGE | INTRAMUSCULAR | Status: DC
  Administered 2021-10-12 – 2021-10-14 (×3): 30 mg via SUBCUTANEOUS
  Filled 2021-10-11 (×3): qty 0.3

## 2021-10-11 MED ORDER — SODIUM CHLORIDE 0.9% IV SOLUTION
Freq: Once | INTRAVENOUS | Status: AC
Start: 2021-10-11 — End: 2021-10-11

## 2021-10-11 NOTE — Plan of Care (Signed)
  Problem: Activity: Goal: Ability to avoid complications of mobility impairment will improve Outcome: Progressing   Problem: Clinical Measurements: Goal: Postoperative complications will be avoided or minimized Outcome: Progressing   Problem: Pain Management: Goal: Pain level will decrease with appropriate interventions Outcome: Progressing   Problem: Education: Goal: Knowledge of General Education information will improve Description: Including pain rating scale, medication(s)/side effects and non-pharmacologic comfort measures Outcome: Progressing   Problem: Health Behavior/Discharge Planning: Goal: Ability to manage health-related needs will improve Outcome: Progressing   Problem: Clinical Measurements: Goal: Ability to maintain clinical measurements within normal limits will improve Outcome: Progressing Goal: Will remain free from infection Outcome: Progressing Goal: Diagnostic test results will improve Outcome: Progressing Goal: Respiratory complications will improve Outcome: Progressing Goal: Cardiovascular complication will be avoided Outcome: Progressing   Problem: Activity: Goal: Risk for activity intolerance will decrease Outcome: Progressing   Problem: Nutrition: Goal: Adequate nutrition will be maintained Outcome: Progressing   Problem: Coping: Goal: Level of anxiety will decrease Outcome: Progressing   Problem: Pain Managment: Goal: General experience of comfort will improve Outcome: Progressing   Problem: Safety: Goal: Ability to remain free from injury will improve Outcome: Progressing

## 2021-10-11 NOTE — Progress Notes (Signed)
Dr. Rudene Christians notified regarding patient latest VS

## 2021-10-11 NOTE — Progress Notes (Addendum)
ORTHOPAEDICS PROGRESS NOTE  PATIENT NAME: Michele Moore DOB: 01/07/1951  MRN: [id number]  POD # 2: Left knee revision arthroplasty  Subjective: Patient denies any nausea or vomiting.  She denies any chest pain or shortness of breath. She denies any symptoms despite being hypotensive earlier this morning.  Objective: Vital signs in last 24 hours: Temp:  [97.5 F (36.4 C)-98.3 F (36.8 C)] 97.7 F (36.5 C) (08/18 0756) Pulse Rate:  [55-66] 57 (08/18 0756) Resp:  [16-18] 17 (08/18 0756) BP: (82-132)/(43-69) 88/43 (08/18 0756) SpO2:  [92 %-100 %] 95 % (08/18 0756)  Intake/Output from previous day: 08/17 0701 - 08/18 0700 In: 600 [P.O.:600] Out: -   Recent Labs    10/09/21 1106  HGB 8.2*  HCT 24.0*  K 4.1  CL 107  BUN 42*  CREATININE 3.40*  GLUCOSE 78    EXAM General: Well-developed well-nourished female seen in no apparent discomfort. Lungs: clear to auscultation Cardiac: normal rate and regular rhythm Left lower extremity: No significant ecchymosis or erythema to the knee.  Dressing with scant spotting of old blood.  Patient can perform straight leg raise with some assist.  Homans test is negative. Neurologic: Awake, alert, and oriented.  Sensorimotor function are intact.  Assessment: Left knee revision arthroplasty  Secondary diagnoses: Hypotension Chronic gouty arthropathy Chronic kidney disease Essential hypertension Gastroesophageal reflux disease Hyperlipidemia Lumbar radiculopathy Migraines Osteoporosis Rheumatoid arthritis Obesity (BMI = 38.75)  Plan: Notes from physical therapy and Occupational Therapy reviewed.  Patient is making slow progress with mobilization. Patient's vitals stabilized yesterday after discontinuing metoprolol but became hypotensive earlier this morning.  She has been asymptomatic.  Will obtain hemoglobin and metabolic be panel and closely monitor vital signs today. Continue physical therapy and Occupational Therapy.  Will  discuss option of Skilled Nursing with TOC. DVT Prophylaxis - Lovenox, TED hose, and SCDs  Michele Moore P. Holley Bouche M.D.

## 2021-10-11 NOTE — Progress Notes (Signed)
Spoke with patient at bedside regarding therapy recommendation. Patient is agreeable to SNF. Her preference is [facility]. FL2 completed. Bed search initiated.

## 2021-10-11 NOTE — NC FL2 (Signed)
[place] LEVEL OF CARE SCREENING TOOL     IDENTIFICATION  Patient Name: Michele Moore Birthdate: August 26, 1950 Sex: female Admission Date (Current Location): 10/09/2021  [facility] and [place] Number:  Engineering geologist and Address:  [facility], [address], [place], [place] [address]      Provider Number: [id number]  Attending Physician Name and Address:  Dereck Leep, MD  Relative Name and Phone Number:  Afua Hoots    Current Level of Care: Hospital Recommended Level of Care: [place] Prior Approval Number:    Date Approved/Denied:   PASRR Number: [id number]  Discharge Plan: SNF    Current Diagnoses: Patient Active Problem List   Diagnosis Date Noted   S/P revision of total knee 10/09/2021   Chronic instability of left knee 02/12/2021   Long term methotrexate user 01/03/2021   Stage 3b chronic kidney disease ([place]) 01/03/2021   Piriformis syndrome of left side 11/15/2020   Normocytic anemia 04/22/2020   Lumbar spondylosis 02/16/2020   Arthritis of left knee 11/24/2019   Left hip pain 08/08/2019   Primary osteoarthritis of left hip 08/08/2019   Non-seasonal allergic rhinitis 04/12/2019   Bursitis of left shoulder 03/24/2019   Benign essential hypertension 03/06/2019   Proteinuria 03/06/2019   Trochanteric bursitis of left hip 10/04/2018   SI joint arthritis (left) 10/04/2018   Exertional chest pain 09/30/2018   CKD (chronic kidney disease), stage III ([place]) 09/30/2018   Psoriatic arthritis ([place]) 04/23/2018   Trigger ring finger of left hand 04/14/2018   Female pelvic pain 01/20/2018   History of revision of total knee arthroplasty 01/20/2018   Iron deficiency anemia    Heme + stool    Acute gastritis without hemorrhage    Gastric polyps    Benign neoplasm of ascending colon    Benign neoplasm of transverse colon    Polyp of sigmoid colon    Hypertension 10/01/2017   Hypothyroidism 10/01/2017    Gastroesophageal reflux disease 10/01/2017   Hypokalemia 10/01/2017   Mixed hyperlipidemia 10/01/2017   Reactive airway disease 10/01/2017   Bradycardia 08/24/2017   Severe obesity (BMI 35.0-35.9 with comorbidity) ([place]) 08/18/2017   Chronic gouty arthropathy without tophi 06/25/2017   Encounter for long-term (current) use of high-risk medication 06/25/2017   Positive ANA (antinuclear antibody) 06/17/2017   Cervical facet syndrome 06/16/2017   Primary osteoarthritis of right shoulder 06/16/2017   Sprain of anterior talofibular ligament of right ankle 06/16/2017   Lumbar radiculopathy 04/21/2017   Lumbar degenerative disc disease 04/21/2017   Chronic pain syndrome 04/21/2017   Spinal stenosis, lumbar region, with neurogenic claudication 04/21/2017   SS-A antibody positive 03/05/2017   Chronic knee pain after total replacement of left knee joint 08/03/2015   DOE (dyspnea on exertion) 12/28/2013    Orientation RESPIRATION BLADDER Height & Weight     Self, Time, Situation, Place  Normal Continent Weight: 117.9 kg Height:  '5\' 11"'$  (180.3 cm)  BEHAVIORAL SYMPTOMS/MOOD NEUROLOGICAL BOWEL NUTRITION STATUS   (n/a)  (n/a) Continent Diet  AMBULATORY STATUS COMMUNICATION OF NEEDS Skin   Limited Assist Verbally Surgical wounds                       Personal Care Assistance Level of Assistance  Bathing, Dressing Bathing Assistance: Limited assistance   Dressing Assistance: Limited assistance     Functional Limitations Info             [place]  PT (By licensed PT)     PT Frequency: BID              Contractures Contractures Info: Not present    Additional Factors Info  Code Status, Allergies Code Status Info: FULL Allergies Info: Ampicillin, Penicillins, Vancomycin, Vibramycin (Doxycycline Calcium)           Current Medications (10/11/2021):  This is the current hospital active medication list Current Facility-Administered Medications   Medication Dose Route Frequency Provider Last Rate Last Admin   0.9 %  sodium chloride infusion (Manually program via Guardrails IV Fluids)   Intravenous Once Hooten, Laurice Record, MD       0.9 %  sodium chloride infusion   Intravenous Continuous Hooten, Laurice Record, MD 100 mL/hr at 10/10/21 0452 Restarted at 10/10/21 0452   acetaminophen (TYLENOL) tablet 325-650 mg  325-650 mg Oral Q6H PRN Hooten, Laurice Record, MD       albuterol (PROVENTIL) (2.5 MG/3ML) 0.083% nebulizer solution 3 mL  3 mL Inhalation Q6H PRN Hooten, Laurice Record, MD       allopurinol (ZYLOPRIM) tablet 200 mg  200 mg Oral BH-q7a Hooten, Laurice Record, MD   200 mg at 10/11/21 0648   alum & mag hydroxide-simeth (MAALOX/MYLANTA) 200-200-20 MG/5ML suspension 30 mL  30 mL Oral Q4H PRN Hooten, Laurice Record, MD       bisacodyl (DULCOLAX) suppository 10 mg  10 mg Rectal Daily PRN Hooten, Laurice Record, MD       celecoxib (CELEBREX) capsule 200 mg  200 mg Oral BID Dereck Leep, MD   200 mg at 10/11/21 0931   cholecalciferol (VITAMIN D3) 25 MCG (1000 UNIT) tablet 2,000 Units  2,000 Units Oral Daily Hooten, Laurice Record, MD   2,000 Units at 10/11/21 0932   clobetasol ointment (TEMOVATE) 0.05 %   Topical BID Dereck Leep, MD   Given at 10/11/21 1610   dapagliflozin propanediol (FARXIGA) tablet 10 mg  10 mg Oral q morning Hooten, Laurice Record, MD   10 mg at 10/11/21 1200   diphenhydrAMINE (BENADRYL) 12.5 MG/5ML elixir 12.5-25 mg  12.5-25 mg Oral Q4H PRN Hooten, Laurice Record, MD       enoxaparin (LOVENOX) injection 30 mg  30 mg Subcutaneous Q12H Hooten, Laurice Record, MD   30 mg at 10/11/21 0933   ferrous sulfate tablet 325 mg  325 mg Oral BID WC Hooten, Laurice Record, MD   325 mg at 10/11/21 0900   furosemide (LASIX) tablet 40 mg  40 mg Oral Daily Dereck Leep, MD   40 mg at 10/11/21 0933   gabapentin (NEURONTIN) tablet 600 mg  600 mg Oral QHS Hooten, Laurice Record, MD   600 mg at 10/10/21 2219   HYDROmorphone (DILAUDID) injection 0.5-1 mg  0.5-1 mg Intravenous Q4H PRN Dereck Leep, MD   1 mg at  10/09/21 1854   ketoconazole (NIZORAL) 2 % cream   Topical Daily PRN Dereck Leep, MD       levothyroxine (SYNTHROID) tablet 175 mcg  175 mcg Oral Q0600 Dereck Leep, MD   175 mcg at 10/11/21 9604   magnesium hydroxide (MILK OF MAGNESIA) suspension 30 mL  30 mL Oral Daily Hooten, Laurice Record, MD   30 mL at 10/11/21 0934   meclizine (ANTIVERT) tablet 12.5 mg  12.5 mg Oral TID PRN Hooten, Laurice Record, MD       menthol-cetylpyridinium (CEPACOL) lozenge 3 mg  1 lozenge Oral PRN Hooten, Laurice Record, MD  Or   phenol (CHLORASEPTIC) mouth spray 1 spray  1 spray Mouth/Throat PRN Hooten, Laurice Record, MD       methotrexate (RHEUMATREX) tablet 7.5 mg  7.5 mg Oral Q Fri Hooten, Laurice Record, MD   7.5 mg at 10/11/21 1000   metoCLOPramide (REGLAN) tablet 10 mg  10 mg Oral TID AC & HS Hooten, Laurice Record, MD   10 mg at 10/11/21 1305   montelukast (SINGULAIR) tablet 10 mg  10 mg Oral QHS Hooten, Laurice Record, MD   10 mg at 10/10/21 2220   multivitamin with minerals tablet 1 tablet  1 tablet Oral Daily Delena Bali, [facility]   1 tablet at 10/11/21 6004   nystatin cream (MYCOSTATIN) 1 Application  1 Application Topical BID Dereck Leep, MD   1 Application at 59/97/74 1423   omega-3 acid ethyl esters (LOVAZA) capsule 1 g  1 capsule Oral BID Hooten, Laurice Record, MD   1 g at 10/11/21 0935   ondansetron (ZOFRAN) tablet 4 mg  4 mg Oral Q6H PRN Hooten, Laurice Record, MD       Or   ondansetron (ZOFRAN) injection 4 mg  4 mg Intravenous Q6H PRN Hooten, Laurice Record, MD       oxyCODONE (Oxy IR/ROXICODONE) immediate release tablet 10 mg  10 mg Oral Q4H PRN Dereck Leep, MD   10 mg at 10/10/21 2219   oxyCODONE (Oxy IR/ROXICODONE) immediate release tablet 5 mg  5 mg Oral Q4H PRN Dereck Leep, MD   5 mg at 10/10/21 0959   pantoprazole (PROTONIX) EC tablet 40 mg  40 mg Oral BID Dereck Leep, MD   40 mg at 10/11/21 0933   potassium chloride (KLOR-CON M) CR tablet 10 mEq  10 mEq Oral Daily Dereck Leep, MD   10 mEq at 10/11/21 0933    senna-docusate (Senokot-S) tablet 1 tablet  1 tablet Oral BID Dereck Leep, MD   1 tablet at 10/11/21 0931   sodium phosphate (FLEET) 7-19 GM/118ML enema 1 enema  1 enema Rectal Once PRN Hooten, Laurice Record, MD       tiZANidine (ZANAFLEX) tablet 4 mg  4 mg Oral QHS Hooten, Laurice Record, MD   4 mg at 10/10/21 2220   traMADol (ULTRAM) tablet 50-100 mg  50-100 mg Oral Q4H PRN Dereck Leep, MD   100 mg at 10/10/21 0724   triamcinolone cream (KENALOG) 0.1 % cream 1 Application  1 Application Topical BID Hooten, Laurice Record, MD   1 Application at 95/32/02 1318   triamcinolone cream (KENALOG) 0.1 % cream   Topical BID PRN Hooten, Laurice Record, MD       triamterene-hydrochlorothiazide (MAXZIDE-25) 37.5-25 MG per tablet 2 tablet  2 tablet Oral Daily Delena Bali, [facility]   2 tablet at 10/11/21 1306     Discharge Medications: Please see discharge summary for a list of discharge medications.  Relevant Imaging Results:  Relevant Lab Results:   Additional Information SS# [id number]  Laurena Slimmer, RN

## 2021-10-11 NOTE — Progress Notes (Signed)
PT Cancellation Note  Patient Details Name: Michele Moore MRN: [id number] DOB: 1950/05/04   Cancelled Treatment:    Reason Eval/Treat Not Completed: Other (comment).  Chart reviewed.  Pt's Hgb noted to be 6.3 this morning with plan for blood transfusion.  MD Hooten cleared pt for in bed ex's.  Pt resting in bed upon PT arrival--pt reports just getting back from using [facility] with staff assist and was feeling weak and dizzy laying in bed--pt declined any in bed ex's (nurse notified and reports plan to start blood transfusion soon).  Will re-attempt PT session later today.  Leitha Bleak, PT 10/11/21, 11:38 AM

## 2021-10-11 NOTE — Progress Notes (Signed)
Physical Therapy Treatment Patient Details Name: Michele Moore MRN: [id number] DOB: 04-May-1950 Today's Date: 10/11/2021   History of Present Illness Pt is a 71 y.o. female s/p L knee revision arthroplasty with polyethylene exchange 10/09/21 secondary to L knee instability s/p L TKA.  PMH includes L TKA 07/10/2009; L knee polyethylene exchange 01/02/2011; pt also reports h/o 2 R knee surgeries.    PT Comments    Pt sitting on BSC upon PT arrival; pt declining ambulation (other than to recliner) d/t still feeling lightheaded (pt's BP 135/67 sitting in recliner) and tired (although better than this morning/prior to blood transfusion).  Min to mod assist with transfer from [facility] and min assist to ambulate 5 feet BSC to recliner with RW use.  Pt tolerated LE ex's fairly well (with assist as needed for L LE) sitting in recliner; L knee AROM to 80 degrees.  Will continue to focus on strengthening, knee ROM, and progressive functional mobility during hospitalization.    Recommendations for follow up therapy are one component of a multi-disciplinary discharge planning process, led by the attending physician.  Recommendations may be updated based on patient status, additional functional criteria and insurance authorization.  Follow Up Recommendations  Skilled nursing-short term rehab (<3 hours/day) Can patient physically be transported by private vehicle: No   Assistance Recommended at Discharge Frequent or constant Supervision/Assistance  Patient can return home with the following A lot of help with bathing/dressing/bathroom;Assistance with cooking/housework;Assist for transportation;Help with stairs or ramp for entrance;A lot of help with walking and/or transfers   Equipment Recommendations  Rolling walker (2 wheels);BSC/3in1;Wheelchair (measurements PT);Wheelchair cushion (measurements PT)    Recommendations for Other Services OT consult     Precautions / Restrictions Precautions Precautions:  Knee Precaution Booklet Issued: Yes (comment) Restrictions Weight Bearing Restrictions: Yes LLE Weight Bearing: Weight bearing as tolerated     Mobility  Bed Mobility               General bed mobility comments: Deferred (pt on BSC upon PT arrival)    Transfers Overall transfer level: Needs assistance Equipment used: Rolling walker (2 wheels) Transfers: Sit to/from Stand Sit to Stand: Min assist, Mod assist (from [facility])           General transfer comment: vc's for UE/LE placement    Ambulation/Gait Ambulation/Gait assistance: Min assist Gait Distance (Feet): 5 Feet (BSC to recliner) Assistive device: Rolling walker (2 wheels)   Gait velocity: decreased     General Gait Details: antalgic; decreased stance time L LE; assist to steady   Chief Strategy Officer    Modified Rankin (Stroke Patients Only)       Balance Overall balance assessment: Needs assistance Sitting-balance support: No upper extremity supported, Feet supported Sitting balance-Leahy Scale: Good Sitting balance - Comments: steady sitting reaching within BOS   Standing balance support: Bilateral upper extremity supported, During functional activity, Reliant on assistive device for balance Standing balance-Leahy Scale: Poor Standing balance comment: assist for balance for standing activities                            Cognition Arousal/Alertness: Awake/alert Behavior During Therapy: WFL for tasks assessed/performed Overall Cognitive Status: Within Functional Limits for tasks assessed  General Comments: Pt requiring redirection for conversation.        Exercises Total Joint Exercises Ankle Circles/Pumps: AROM, Strengthening, Both, 10 reps, Supine Quad Sets: AROM, Strengthening, Both, 10 reps, Supine Hip ABduction/ADduction: AAROM, Strengthening, Left, 10 reps, Supine Straight Leg Raises: AAROM,  Strengthening, Left, 10 reps, Supine Long Arc Quad: AAROM, Strengthening, Left, 10 reps, Seated Knee Flexion: AROM, Strengthening, Left, 10 reps, Seated Goniometric ROM: L knee AROM 5-80 degrees    General Comments  Nursing cleared pt for participation in physical therapy (pt s/p blood transfusion).  Pt agreeable to PT session.      Pertinent Vitals/Pain Pain Assessment Pain Assessment: 0-10 Pain Score: 6  Pain Location: L knee Pain Descriptors / Indicators: Grimacing, Guarding, Constant, Aching, Tender Pain Intervention(s): Limited activity within patient's tolerance, Monitored during session, Repositioned, Patient requesting pain meds-RN notified, RN gave pain meds during session, Other (comment) (polar care applied)    Home Living                          Prior Function            PT Goals (current goals can now be found in the care plan section) Acute Rehab PT Goals Patient Stated Goal: to go home PT Goal Formulation: With patient Time For Goal Achievement: 10/24/21 Potential to Achieve Goals: Good Progress towards PT goals: Progressing toward goals    Frequency    BID      PT Plan Current plan remains appropriate    Co-evaluation              AM-PAC PT "6 Clicks" Mobility   Outcome Measure  Help needed turning from your back to your side while in a flat bed without using bedrails?: A Little Help needed moving from lying on your back to sitting on the side of a flat bed without using bedrails?: A Lot Help needed moving to and from a bed to a chair (including a wheelchair)?: A Lot Help needed standing up from a chair using your arms (e.g., wheelchair or bedside chair)?: A Lot Help needed to walk in hospital room?: Total Help needed climbing 3-5 steps with a railing? : Total 6 Click Score: 11    End of Session Equipment Utilized During Treatment: Gait belt Activity Tolerance: Patient limited by pain;Patient limited by fatigue Patient left:  in chair;with call bell/phone within reach;with chair alarm set;with nursing/sitter in room;Other (comment) (L heel floating via towel roll; R heel floating via pillow support; polar care in place; nurse to place SCD once lab finished drawing blood) Nurse Communication: Mobility status;Precautions;Weight bearing status PT Visit Diagnosis: Unsteadiness on feet (R26.81);Other abnormalities of gait and mobility (R26.89);Muscle weakness (generalized) (M62.81);History of falling (Z91.81);Pain Pain - Right/Left: Left Pain - part of body: Knee     Time: 1308-6578 PT Time Calculation (min) (ACUTE ONLY): 34 min  Charges:  $Therapeutic Exercise: 8-22 mins $Therapeutic Activity: 8-22 mins                    Leitha Bleak, PT 10/11/21, 5:43 PM

## 2021-10-11 NOTE — Care Management Important Message (Signed)
Important Message  Patient Details  Name: Michele Moore MRN: [id number] Date of Birth: 1950/04/11   Medicare Important Message Given:  N/A - LOS <3 / Initial given by admissions     [name] 10/11/2021, 2:26 PM

## 2021-10-12 LAB — TYPE AND SCREEN
ABO/RH(D): O POS
Antibody Screen: NEGATIVE
Unit division: 0

## 2021-10-12 LAB — BPAM RBC
Blood Product Expiration Date: 202308222359
ISSUE DATE / TIME: 202308181135
Unit Type and Rh: 9500

## 2021-10-12 LAB — BASIC METABOLIC PANEL
Anion gap: 5 (ref 5–15)
BUN: 49 mg/dL — ABNORMAL HIGH (ref 8–23)
CO2: 25 mmol/L (ref 22–32)
Calcium: 9 mg/dL (ref 8.9–10.3)
Chloride: 107 mmol/L (ref 98–111)
Creatinine, Ser: 2.14 mg/dL — ABNORMAL HIGH (ref 0.44–1.00)
GFR, Estimated: 24 mL/min — ABNORMAL LOW (ref >60)
Glucose, Bld: 137 mg/dL — ABNORMAL HIGH (ref 70–99)
Potassium: 4.7 mmol/L (ref 3.5–5.1)
Sodium: 137 mmol/L (ref 135–145)

## 2021-10-12 LAB — HEMOGLOBIN AND HEMATOCRIT, BLOOD
HCT: 21.5 % — ABNORMAL LOW (ref 36.0–46.0)
Hemoglobin: 7 g/dL — ABNORMAL LOW (ref 12.0–15.0)

## 2021-10-12 MED ORDER — TRAMADOL HCL 50 MG PO TABS: 50.0000 mg | ORAL_TABLET | ORAL | 0 refills | Status: DC | PRN

## 2021-10-12 MED ORDER — ENOXAPARIN SODIUM 40 MG/0.4ML IJ SOSY: 40.0000 mg | PREFILLED_SYRINGE | INTRAMUSCULAR | 0 refills | Status: DC

## 2021-10-12 MED ORDER — OXYCODONE HCL 5 MG PO TABS: 5.0000 mg | ORAL_TABLET | ORAL | 0 refills | Status: DC | PRN

## 2021-10-12 NOTE — Progress Notes (Signed)
Subjective: 3 Days Post-Op Procedure(s) (LRB): LEFT TOTAL KNEE REVISION WITH POLYETHYLENE EXCHANGE. (Left) Patient reports pain as mild.   Patient is well, and has had no acute complaints or problems.  Feeling better this morning.  Received 1 unit of transfused blood yesterday. Plan is to go Rehab after hospital stay. Negative for chest pain and shortness of breath Fever: no Gastrointestinal: Negative for nausea and vomiting  Objective: Vital signs in last 24 hours: Temp:  [97 F (36.1 C)-98 F (36.7 C)] 97.7 F (36.5 C) (08/19 0341) Pulse Rate:  [57-80] 64 (08/19 0341) Resp:  [16-18] 18 (08/19 0341) BP: (88-135)/(43-66) 121/66 (08/19 0341) SpO2:  [94 %-100 %] 94 % (08/19 0341)  Intake/Output from previous day:  Intake/Output Summary (Last 24 hours) at 10/12/2021 0714 Last data filed at 10/11/2021 2010 Gross per 24 hour  Intake 666 ml  Output 0 ml  Net 666 ml    Intake/Output this shift: No intake/output data recorded.  Labs: Recent Labs    10/09/21 1106 10/11/21 0944 10/11/21 1636 10/12/21 0638  HGB 8.2* 6.3* 7.3* 7.0*   Recent Labs    10/11/21 1636 10/12/21 0638  HCT 22.4* 21.5*   Recent Labs    10/11/21 0944 10/12/21 0638  NA 134* 137  K 4.1 4.7  CL 106 107  CO2 22 25  BUN 52* 49*  CREATININE 2.71* 2.14*  GLUCOSE 159* 137*  CALCIUM 9.1 9.0   No results for input(s): "LABPT", "INR" in the last 72 hours.   EXAM General - Patient is Alert and Oriented Extremity - Neurovascular intact Sensation intact distally Dorsiflexion/Plantar flexion intact Compartment soft Dressing/Incision - clean, dry, no drainage Motor Function - intact, moving foot and toes well on exam.  Patient ambulated 5 feet with physical therapy.  Past Medical History:  Diagnosis Date   (HFpEF) heart failure with preserved ejection fraction ([place]) 01/13/2014   a.) TTE 01/13/2014: EF 45%, apical HK, mod LVH, mild MAC, mild LAE/RVE, mod MR/TR/PR; b.) TTE 10/01/2018: EF 55-60%,  mild MVH, mild LAE, triv MR/TR, G1DD   Allergy    ANA positive    Anemia of chronic renal failure, stage 3 (moderate), unspecified whether stage 3a or 3b CKD (HCC)    Aortic atherosclerosis (HCC)    Asthma    Cataract    Chronic pain syndrome    a.) followed by [facility] Holley Raring, MD)   Chronic, continuous use of opioids    CKD (chronic kidney disease), stage III ([place])    Complication of anesthesia    a.) anesthesia awareness/premature emergence during colonoscopy   DDD (degenerative disc disease), lumbar    Dyspnea on exertion    Dysrhythmia    IRREGULAR HEART BEAT   Family history of adverse reaction to anesthesia    sister difficult to put to sleep   GERD (gastroesophageal reflux disease)    Glaucoma    Gout    H/O tooth extraction    all lower teeth 1/19   Hemorrhoid    History of hiatal hernia    History of kidney stones    Hypertension    Hypothyroidism    Implantable loop recorder present 08/22/2021   a.) s/p implantation of [employer] LINQ Reveal ILR; serial #: [id number]   Long term current use of immunosuppressive drug    a.) MTX + apremilast   Migraines    Mixed hyperlipidemia    Multiple gastric ulcers    Osteoarthritis    Psoriasis    Psoriatic arthritis ([place])  a.) on apremilast   Rheumatoid arthritis ([place])    a.) on MTX   SS-A antibody positive    Wears dentures    partial upper and lower    Assessment/Plan: 3 Days Post-Op Procedure(s) (LRB): LEFT TOTAL KNEE REVISION WITH POLYETHYLENE EXCHANGE. (Left) Principal Problem:   S/P revision of total knee  Estimated body mass index is 36.26 kg/m as calculated from the following:   Height as of this encounter: _0  (1.803 m).   Weight as of this encounter: 117.9 kg. Advance diet Up with therapy  DVT Prophylaxis - Lovenox, Foot Pumps, and TED hose Weight-Bearing as tolerated to left leg  Reche Dixon, PA-C Orthopaedic Surgery 10/12/2021, 7:14 AM

## 2021-10-12 NOTE — Progress Notes (Signed)
Physical Therapy Treatment Patient Details Name: Michele Moore MRN: [id number] DOB: 1950/06/30 Today's Date: 10/12/2021   History of Present Illness Pt is a 71 y.o. female s/p L knee revision arthroplasty with polyethylene exchange 10/09/21 secondary to L knee instability s/p L TKA.  PMH includes L TKA 07/10/2009; L knee polyethylene exchange 01/02/2011; pt also reports h/o 2 R knee surgeries.    PT Comments    Pt is making good progress towards goals with ability to ambulate in hallway using RW. Good endurance, although does fatigue quickly. HEP given and reviewed. Pt wished to return to bed after session. Assisted with meeting needs. Will continue to progress as able.   Recommendations for follow up therapy are one component of a multi-disciplinary discharge planning process, led by the attending physician.  Recommendations may be updated based on patient status, additional functional criteria and insurance authorization.  Follow Up Recommendations  Skilled nursing-short term rehab (<3 hours/day) Can patient physically be transported by private vehicle: Yes   Assistance Recommended at Discharge Intermittent Supervision/Assistance  Patient can return home with the following A lot of help with bathing/dressing/bathroom;Assistance with cooking/housework;Assist for transportation;Help with stairs or ramp for entrance;A lot of help with walking and/or transfers   Equipment Recommendations  Rolling walker (2 wheels);BSC/3in1;Wheelchair (measurements PT);Wheelchair cushion (measurements PT)    Recommendations for Other Services       Precautions / Restrictions Precautions Precautions: Knee Precaution Booklet Issued: Yes (comment) Restrictions Weight Bearing Restrictions: Yes LLE Weight Bearing: Weight bearing as tolerated     Mobility  Bed Mobility Overal bed mobility: Needs Assistance Bed Mobility: Sit to Supine     Supine to sit: Min assist Sit to supine: Min assist   General bed  mobility comments: cues for sequencing. ONce in bed, able to utilize bed railing and slide up towards [facility]    Transfers Overall transfer level: Needs assistance Equipment used: Rolling walker (2 wheels) Transfers: Sit to/from Stand Sit to Stand: Min assist           General transfer comment: does better from surface with railings taking less physical assist to perform. RW used    Ambulation/Gait Ambulation/Gait assistance: Herbalist (Feet): 50 Feet Assistive device: Rolling walker (2 wheels) Gait Pattern/deviations: Step-to pattern       General Gait Details: fatigues quickly with 1 standing brek required. RW used with good carryover regarding sequencing of turns etc.   Chief Strategy Officer    Modified Rankin (Stroke Patients Only)       Balance Overall balance assessment: Needs assistance Sitting-balance support: No upper extremity supported, Feet supported Sitting balance-Leahy Scale: Good     Standing balance support: Bilateral upper extremity supported, During functional activity, Reliant on assistive device for balance Standing balance-Leahy Scale: Fair                              Cognition Arousal/Alertness: Awake/alert Behavior During Therapy: WFL for tasks assessed/performed Overall Cognitive Status: Within Functional Limits for tasks assessed                                 General Comments: very pleasant and agreeable to session        Exercises Total Joint Exercises Goniometric ROM: L knee AAROM: 4-91 degrees Other Exercises Other Exercises: seated ther-ex  performed on L LE including AP, quad sets, hip abd/add, SLRs, and LAQ. 12 reps with cga Other Exercises: ambulated to [facility] to void. Min assist for transfer and hygiene    General Comments        Pertinent Vitals/Pain Pain Assessment Pain Assessment: 0-10 Pain Score: 6  Pain Location: L knee Pain Descriptors /  Indicators: Grimacing, Guarding, Constant, Aching, Tender Pain Intervention(s): Limited activity within patient's tolerance, Repositioned, Premedicated before session, Ice applied    Home Living                          Prior Function            PT Goals (current goals can now be found in the care plan section) Acute Rehab PT Goals Patient Stated Goal: to go home PT Goal Formulation: With patient Time For Goal Achievement: 10/24/21 Potential to Achieve Goals: Good Progress towards PT goals: Progressing toward goals    Frequency    BID      PT Plan Current plan remains appropriate    Co-evaluation              AM-PAC PT "6 Clicks" Mobility   Outcome Measure  Help needed turning from your back to your side while in a flat bed without using bedrails?: A Little Help needed moving from lying on your back to sitting on the side of a flat bed without using bedrails?: A Little Help needed moving to and from a bed to a chair (including a wheelchair)?: A Little Help needed standing up from a chair using your arms (e.g., wheelchair or bedside chair)?: A Little Help needed to walk in hospital room?: A Little Help needed climbing 3-5 steps with a railing? : A Lot 6 Click Score: 17    End of Session Equipment Utilized During Treatment: Gait belt Activity Tolerance: Patient limited by pain;Patient limited by fatigue Patient left: in bed;with bed alarm set;with SCD's reapplied Nurse Communication: Mobility status;Precautions;Weight bearing status PT Visit Diagnosis: Unsteadiness on feet (R26.81);Other abnormalities of gait and mobility (R26.89);Muscle weakness (generalized) (M62.81);History of falling (Z91.81);Pain Pain - Right/Left: Left Pain - part of body: Knee     Time: 5681-2751 PT Time Calculation (min) (ACUTE ONLY): 27 min  Charges:  $Gait Training: 8-22 mins $Therapeutic Exercise: 8-22 mins                     Greggory Stallion, PT, DPT,  GCS [phone]    Jordann Grime 10/12/2021, 3:18 PM

## 2021-10-12 NOTE — Progress Notes (Signed)
Physical Therapy Treatment Patient Details Name: Michele Moore MRN: [id number] DOB: Jul 09, 1950 Today's Date: 10/12/2021   History of Present Illness Pt is a 71 y.o. female s/p L knee revision arthroplasty with polyethylene exchange 10/09/21 secondary to L knee instability s/p L TKA.  PMH includes L TKA 07/10/2009; L knee polyethylene exchange 01/02/2011; pt also reports h/o 2 R knee surgeries.    PT Comments    Pt is making good progress towards goals with ability to ambulate in room this date, using RW. Step to gait pattern with short steps. Cues for safety with turns. Good progress with AROM and HEP reviewed. Will continue to progress as able. Is still pain limited.   Recommendations for follow up therapy are one component of a multi-disciplinary discharge planning process, led by the attending physician.  Recommendations may be updated based on patient status, additional functional criteria and insurance authorization.  Follow Up Recommendations  Skilled nursing-short term rehab (<3 hours/day) Can patient physically be transported by private vehicle: Yes   Assistance Recommended at Discharge Intermittent Supervision/Assistance  Patient can return home with the following A lot of help with bathing/dressing/bathroom;Assistance with cooking/housework;Assist for transportation;Help with stairs or ramp for entrance;A lot of help with walking and/or transfers   Equipment Recommendations  Rolling walker (2 wheels);BSC/3in1;Wheelchair (measurements PT);Wheelchair cushion (measurements PT)    Recommendations for Other Services       Precautions / Restrictions Precautions Precautions: Knee Precaution Booklet Issued: Yes (comment) Restrictions Weight Bearing Restrictions: Yes LLE Weight Bearing: Weight bearing as tolerated     Mobility  Bed Mobility Overal bed mobility: Needs Assistance Bed Mobility: Supine to Sit     Supine to sit: Min assist     General bed mobility comments: cues  for sequencing. Once seated at EOB, upright posture noted    Transfers Overall transfer level: Needs assistance Equipment used: Rolling walker (2 wheels) Transfers: Sit to/from Stand Sit to Stand: Mod assist           General transfer comment: cues for hand placement. RW used. Able to stand from low surface    Ambulation/Gait Ambulation/Gait assistance: Min assist Gait Distance (Feet): 40 Feet Assistive device: Rolling walker (2 wheels) Gait Pattern/deviations: Step-to pattern       General Gait Details: ambulated with antalgic gait and step to pattern. RW used and cues for turns   Marine scientist Rankin (Stroke Patients Only)       Balance Overall balance assessment: Needs assistance Sitting-balance support: No upper extremity supported, Feet supported Sitting balance-Leahy Scale: Good     Standing balance support: Bilateral upper extremity supported, During functional activity, Reliant on assistive device for balance Standing balance-Leahy Scale: Fair                              Cognition Arousal/Alertness: Awake/alert Behavior During Therapy: WFL for tasks assessed/performed Overall Cognitive Status: Within Functional Limits for tasks assessed                                 General Comments: very pleasant and agreeable to session        Exercises Total Joint Exercises Goniometric ROM: L knee AAROM: 4-91 degrees Other Exercises Other Exercises: supine ther-ex performed on L LE including AP, quad sets, hip abd/add, and  knee flex stretches. 10 reps with cga Other Exercises: ambulated to [facility] to void. Min assist for transfer and hygiene    General Comments        Pertinent Vitals/Pain Pain Assessment Pain Assessment: 0-10 Pain Score: 6  Pain Location: L knee Pain Descriptors / Indicators: Grimacing, Guarding, Constant, Aching, Tender Pain Intervention(s): Limited activity within  patient's tolerance, Premedicated before session, Ice applied    Home Living                          Prior Function            PT Goals (current goals can now be found in the care plan section) Acute Rehab PT Goals Patient Stated Goal: to go home PT Goal Formulation: With patient Time For Goal Achievement: 10/24/21 Potential to Achieve Goals: Good Progress towards PT goals: Progressing toward goals    Frequency    BID      PT Plan Current plan remains appropriate    Co-evaluation              AM-PAC PT "6 Clicks" Mobility   Outcome Measure  Help needed turning from your back to your side while in a flat bed without using bedrails?: A Little Help needed moving from lying on your back to sitting on the side of a flat bed without using bedrails?: A Little Help needed moving to and from a bed to a chair (including a wheelchair)?: A Little Help needed standing up from a chair using your arms (e.g., wheelchair or bedside chair)?: A Little Help needed to walk in hospital room?: A Lot Help needed climbing 3-5 steps with a railing? : A Lot 6 Click Score: 16    End of Session Equipment Utilized During Treatment: Gait belt Activity Tolerance: Patient limited by pain;Patient limited by fatigue Patient left: in chair;with chair alarm set Nurse Communication: Mobility status;Precautions;Weight bearing status PT Visit Diagnosis: Unsteadiness on feet (R26.81);Other abnormalities of gait and mobility (R26.89);Muscle weakness (generalized) (M62.81);History of falling (Z91.81);Pain Pain - Right/Left: Left Pain - part of body: Knee     Time: 4496-7591 PT Time Calculation (min) (ACUTE ONLY): 26 min  Charges:  $Gait Training: 8-22 mins $Therapeutic Exercise: 8-22 mins                     Greggory Stallion, PT, DPT, GCS [phone]    Michele Moore 10/12/2021, 11:59 AM

## 2021-10-12 NOTE — Plan of Care (Signed)
  Problem: Education: Goal: Knowledge of the prescribed therapeutic regimen will improve Outcome: Progressing   Problem: Activity: Goal: Ability to avoid complications of mobility impairment will improve Outcome: Progressing   Problem: Activity: Goal: Range of joint motion will improve Outcome: Progressing   Problem: Health Behavior/Discharge Planning: Goal: Ability to manage health-related needs will improve Outcome: Progressing   Problem: Clinical Measurements: Goal: Will remain free from infection Outcome: Progressing   Problem: Activity: Goal: Risk for activity intolerance will decrease Outcome: Progressing   Problem: Coping: Goal: Level of anxiety will decrease Outcome: Progressing   Problem: Elimination: Goal: Will not experience complications related to bowel motility Outcome: Progressing   Problem: Elimination: Goal: Will not experience complications related to urinary retention Outcome: Progressing   Problem: Pain Managment: Goal: General experience of comfort will improve Outcome: Progressing

## 2021-10-13 LAB — HEMOGLOBIN AND HEMATOCRIT, BLOOD
HCT: 27 % — ABNORMAL LOW (ref 36.0–46.0)
Hemoglobin: 8.8 g/dL — ABNORMAL LOW (ref 12.0–15.0)

## 2021-10-13 LAB — HEMOGLOBIN: Hemoglobin: 6.9 g/dL — ABNORMAL LOW (ref 12.0–15.0)

## 2021-10-13 LAB — PREPARE RBC (CROSSMATCH)

## 2021-10-13 MED ORDER — SODIUM CHLORIDE 0.9% IV SOLUTION: Freq: Once | INTRAVENOUS | Status: AC

## 2021-10-13 NOTE — Progress Notes (Signed)
OT Cancellation Note  Patient Details Name: Michele Moore MRN: [id number] DOB: 1950/03/13   Cancelled Treatment:    Reason Eval/Treat Not Completed: Other (comment) Pt currently receiving blood transfusion. RN reporting transfusion is almost complete and requesting OT come back later this afternoon. OT to f/u as available.  Doneta Public 10/13/2021, 2:30 PM

## 2021-10-13 NOTE — Plan of Care (Signed)
  Problem: Education: Goal: Knowledge of the prescribed therapeutic regimen will improve Outcome: Progressing   Problem: Activity: Goal: Ability to avoid complications of mobility impairment will improve Outcome: Progressing   Problem: Activity: Goal: Range of joint motion will improve Outcome: Progressing   Problem: Clinical Measurements: Goal: Postoperative complications will be avoided or minimized Outcome: Progressing   Problem: Pain Management: Goal: Pain level will decrease with appropriate interventions Outcome: Progressing   Problem: Skin Integrity: Goal: Will show signs of wound healing Outcome: Progressing   Problem: Education: Goal: Knowledge of General Education information will improve Description: Including pain rating scale, medication(s)/side effects and non-pharmacologic comfort measures Outcome: Progressing   Problem: Health Behavior/Discharge Planning: Goal: Ability to manage health-related needs will improve Outcome: Progressing   Problem: Clinical Measurements: Goal: Ability to maintain clinical measurements within normal limits will improve Outcome: Progressing   Problem: Clinical Measurements: Goal: Will remain free from infection Outcome: Progressing   Problem: Clinical Measurements: Goal: Cardiovascular complication will be avoided Outcome: Progressing   Problem: Activity: Goal: Risk for activity intolerance will decrease Outcome: Progressing   Problem: Nutrition: Goal: Adequate nutrition will be maintained Outcome: Progressing   Problem: Coping: Goal: Level of anxiety will decrease Outcome: Progressing   Problem: Pain Managment: Goal: General experience of comfort will improve Outcome: Progressing   Problem: Safety: Goal: Ability to remain free from injury will improve Outcome: Progressing   Problem: Skin Integrity: Goal: Risk for impaired skin integrity will decrease Outcome: Progressing

## 2021-10-13 NOTE — Progress Notes (Signed)
Occupational Therapy Treatment Patient Details Name: Michele Moore MRN: [id number] DOB: December 27, 1950 Today's Date: 10/13/2021   History of present illness Pt is a 71 y.o. female s/p L knee revision arthroplasty with polyethylene exchange 10/09/21 secondary to L knee instability s/p L TKA.  PMH includes L TKA 07/10/2009; L knee polyethylene exchange 01/02/2011; pt also reports h/o 2 R knee surgeries.   OT comments  Upon entering session, pt sitting up in recliner ordering dinner. Pt pleasant and agreeable to OT session. Pt requesting to use BSC for toileting. Pt demonstrated good safety awareness during functional mobility and functional transfers with use of RW. Pt also demonstrated improvements in activity tolerance and completing self-care tasks. Pt is making progress toward goal completion. D/C recommendation remains appropriate. OT will continue to follow acutely.    Recommendations for follow up therapy are one component of a multi-disciplinary discharge planning process, led by the attending physician.  Recommendations may be updated based on patient status, additional functional criteria and insurance authorization.    Follow Up Recommendations  Skilled nursing-short term rehab (<3 hours/day)    Assistance Recommended at Discharge Frequent or constant Supervision/Assistance  Patient can return home with the following  A lot of help with walking and/or transfers;A little help with bathing/dressing/bathroom;Assistance with cooking/housework;Assist for transportation;Help with stairs or ramp for entrance   Equipment Recommendations  Other (comment) (defer to next venue of care, could benefit from Martin)    Recommendations for Other Services      Precautions / Restrictions Precautions Precautions: Knee Restrictions Weight Bearing Restrictions: Yes LLE Weight Bearing: Weight bearing as tolerated       Mobility Bed Mobility               General bed mobility comments: NT,  received in recliner    Transfers Overall transfer level: Needs assistance Equipment used: Rolling walker (2 wheels) Transfers: Sit to/from Stand Sit to Stand: Min guard           General transfer comment: STS from recliner and BSC with Min guard + RW     Balance Overall balance assessment: Needs assistance Sitting-balance support: No upper extremity supported, Feet supported Sitting balance-Leahy Scale: Good     Standing balance support: Bilateral upper extremity supported, During functional activity, Reliant on assistive device for balance Standing balance-Leahy Scale: Fair                             ADL either performed or assessed with clinical judgement   ADL Overall ADL's : Needs assistance/impaired     Grooming: Wash/dry face;Wash/dry hands;Oral care;Set up;Sitting Grooming Details (indicate cue type and reason): Pt completed grooming tasks while sitting on BSC                 Toilet Transfer: Rolling walker (2 wheels);Min guard;BSC/3in1   Toileting- Clothing Manipulation and Hygiene: Sit to/from stand;Min guard Toileting - Clothing Manipulation Details (indicate cue type and reason): Pt completed posterior hygiene in standing with Min guard     Functional mobility during ADLs: Min guard;Rolling walker (2 wheels)      Extremity/Trunk Assessment Upper Extremity Assessment Upper Extremity Assessment: Generalized weakness   Lower Extremity Assessment Lower Extremity Assessment: Generalized weakness   Cervical / Trunk Assessment Cervical / Trunk Assessment: Normal    Vision Baseline Vision/History: 3 Glaucoma;4 Cataracts;1 Wears glasses Patient Visual Report: No change from baseline Additional Comments: wears reading glasses, pt endorses having "cataracts" and "glaucoma"  Perception     Praxis      Cognition Arousal/Alertness: Awake/alert Behavior During Therapy: WFL for tasks assessed/performed Overall Cognitive Status: Within  Functional Limits for tasks assessed                                          Exercises      Shoulder Instructions       General Comments      Pertinent Vitals/ Pain       Pain Assessment Pain Assessment: Faces Faces Pain Scale: Hurts a little bit Pain Location: L knee Pain Descriptors / Indicators: Grimacing, Guarding, Constant, Aching, Tender Pain Intervention(s): Ice applied, Limited activity within patient's tolerance, Repositioned  Home Living                                          Prior Functioning/Environment              Frequency  Min 2X/week        Progress Toward Goals  OT Goals(current goals can now be found in the care plan section)  Progress towards OT goals: Progressing toward goals  Acute Rehab OT Goals Patient Stated Goal: get stronger OT Goal Formulation: With patient Time For Goal Achievement: 10/24/21 Potential to Achieve Goals: Good  Plan Discharge plan remains appropriate;Frequency remains appropriate    Co-evaluation                 AM-PAC OT "6 Clicks" Daily Activity     Outcome Measure   Help from another person eating meals?: None Help from another person taking care of personal grooming?: A Little Help from another person toileting, which includes using toliet, bedpan, or urinal?: A Little Help from another person bathing (including washing, rinsing, drying)?: A Lot Help from another person to put on and taking off regular upper body clothing?: A Little Help from another person to put on and taking off regular lower body clothing?: A Lot 6 Click Score: 17    End of Session Equipment Utilized During Treatment: Gait belt;Rolling walker (2 wheels)  OT Visit Diagnosis: Other abnormalities of gait and mobility (R26.89);Muscle weakness (generalized) (M62.81);Pain Pain - Right/Left: Left Pain - part of body: Knee   Activity Tolerance Patient tolerated treatment well   Patient Left  in chair;with call bell/phone within reach;with chair alarm set   Nurse Communication Mobility status        Time: 8527-7824 OT Time Calculation (min): 22 min  Charges: OT General Charges $OT Visit: 1 Visit OT Treatments $Self Care/Home Management : 8-22 mins  [facility] MS, OTR/L ascom [phone]  10/13/21, 4:45 PM

## 2021-10-13 NOTE — Progress Notes (Signed)
Subjective: 4 Days Post-Op Procedure(s) (LRB): LEFT TOTAL KNEE REVISION WITH POLYETHYLENE EXCHANGE. (Left) Patient reports pain as mild.   Patient is well, and has had no acute complaints or problems.  Feeling better this morning.  Received 1 unit of transfused blood Friday. Plan is to go Rehab after hospital stay. Negative for chest pain and shortness of breath Fever: no Gastrointestinal: Negative for nausea and vomiting  Objective: Vital signs in last 24 hours: Temp:  [97.8 F (36.6 C)-98.4 F (36.9 C)] 97.8 F (36.6 C) (08/20 0631) Pulse Rate:  [62-88] 62 (08/20 0631) Resp:  [16-20] 20 (08/20 0631) BP: (122-152)/(62-83) 122/62 (08/20 0631) SpO2:  [97 %-100 %] 97 % (08/20 0631)  Intake/Output from previous day: No intake or output data in the 24 hours ending 10/13/21 0721   Intake/Output this shift: No intake/output data recorded.  Labs: Recent Labs    10/11/21 0944 10/11/21 1636 10/12/21 0638  HGB 6.3* 7.3* 7.0*   Recent Labs    10/11/21 1636 10/12/21 0638  HCT 22.4* 21.5*   Recent Labs    10/11/21 0944 10/12/21 0638  NA 134* 137  K 4.1 4.7  CL 106 107  CO2 22 25  BUN 52* 49*  CREATININE 2.71* 2.14*  GLUCOSE 159* 137*  CALCIUM 9.1 9.0   No results for input(s): "LABPT", "INR" in the last 72 hours.   EXAM General - Patient is Alert and Oriented Extremity - Neurovascular intact Sensation intact distally Dorsiflexion/Plantar flexion intact Compartment soft Dressing/Incision - clean, dry, no drainage Motor Function - intact, moving foot and toes well on exam.  Patient ambulated 50 feet with physical therapy.  Past Medical History:  Diagnosis Date   (HFpEF) heart failure with preserved ejection fraction ([place]) 01/13/2014   a.) TTE 01/13/2014: EF 45%, apical HK, mod LVH, mild MAC, mild LAE/RVE, mod MR/TR/PR; b.) TTE 10/01/2018: EF 55-60%, mild MVH, mild LAE, triv MR/TR, G1DD   Allergy    ANA positive    Anemia of chronic renal failure, stage 3  (moderate), unspecified whether stage 3a or 3b CKD (HCC)    Aortic atherosclerosis (HCC)    Asthma    Cataract    Chronic pain syndrome    a.) followed by  [facility] R[facility])   Chronic, continuous use of opioids    CKD (chronic kidney disease), stage III ([place])    Complication of anesthesia    a.) anesthesia awareness/premature emergence during colonoscopy   DDD (degenerative disc disease), lumbar    Dyspnea on exertion    Dysrhythmia    IRREGULAR HEART BEAT   Family history of adverse reaction to anesthesia    sister difficult to put to sleep   GERD (gastroesophageal reflux disease)    Glaucoma    Gout    H/O tooth extraction    all lower teeth 1/19   Hemorrhoid    History of hiatal hernia    History of kidney stones    Hypertension    Hypothyroidism    Implantable loop recorder present 08/22/2021   a.) s/p implantation of [employer]tr[employer] LINQ Reveal ILR; serial #: [id number]   Long term current use of immunosuppressive drug    a.) MTX + apremilast   Migraines    Mixed hyperlipidemia    Multiple gastric ulcers    Osteoarthritis    Psoriasis    Psoriatic arthritis ([place])    a.) on apremilast   Rheumatoid arthritis (HCC)    a.) on MTX   SS-A antibody positive  Wears dentures    partial upper and lower    Assessment/Plan: 4 Days Post-Op Procedure(s) (LRB): LEFT TOTAL KNEE REVISION WITH POLYETHYLENE EXCHANGE. (Left) Principal Problem:   S/P revision of total knee  Estimated body mass index is 36.26 kg/m as calculated from the following:   Height as of this encounter: _0  (1.803 m).   Weight as of this encounter: 117.9 kg. Advance diet Up with therapy  Acute blood loss anemia.  Hemoglobin 7.0 yesterday.  We will recheck hemoglobin this morning.  Received 1 unit of transfused blood on Friday.  DVT Prophylaxis - Lovenox, Foot Pumps, and TED hose Weight-Bearing as tolerated to left leg  Reche Dixon, PA-C Orthopaedic Surgery 10/13/2021, 7:21 AM

## 2021-10-13 NOTE — Plan of Care (Signed)
  Problem: Education: Goal: Knowledge of the prescribed therapeutic regimen will improve Outcome: Progressing   Problem: Activity: Goal: Ability to avoid complications of mobility impairment will improve Outcome: Progressing Goal: Range of joint motion will improve Outcome: Progressing   Problem: Clinical Measurements: Goal: Postoperative complications will be avoided or minimized Outcome: Progressing   Problem: Pain Management: Goal: Pain level will decrease with appropriate interventions Outcome: Progressing   Problem: Skin Integrity: Goal: Will show signs of wound healing Outcome: Progressing   Problem: Education: Goal: Knowledge of General Education information will improve Description: Including pain rating scale, medication(s)/side effects and non-pharmacologic comfort measures Outcome: Progressing   Problem: Health Behavior/Discharge Planning: Goal: Ability to manage health-related needs will improve Outcome: Progressing   Problem: Clinical Measurements: Goal: Ability to maintain clinical measurements within normal limits will improve Outcome: Progressing Goal: Will remain free from infection Outcome: Progressing Goal: Diagnostic test results will improve Outcome: Progressing Goal: Respiratory complications will improve Outcome: Progressing Goal: Cardiovascular complication will be avoided Outcome: Progressing   Problem: Activity: Goal: Risk for activity intolerance will decrease Outcome: Progressing   Problem: Nutrition: Goal: Adequate nutrition will be maintained Outcome: Progressing   Problem: Coping: Goal: Level of anxiety will decrease Outcome: Progressing   Problem: Elimination: Goal: Will not experience complications related to bowel motility Outcome: Progressing Goal: Will not experience complications related to urinary retention Outcome: Progressing   Problem: Pain Managment: Goal: General experience of comfort will improve Outcome:  Progressing   Problem: Safety: Goal: Ability to remain free from injury will improve Outcome: Progressing

## 2021-10-13 NOTE — Progress Notes (Signed)
Physical Therapy Treatment Patient Details Name: Michele Moore MRN: [id number] DOB: [birth year] Today's Date: 10/13/2021   History of Present Illness Pt is a 71 y.o. female s/p L knee revision arthroplasty with polyethylene exchange 10/09/21 secondary to L knee instability s/p L TKA.  PMH includes L TKA 07/10/2009; L knee polyethylene exchange 01/02/2011; pt also reports h/o 2 R knee surgeries.    PT Comments    Pt is making good progress towards goals. Pt with pending blood transfusion this date, however is not sympomatic at this time and requesting to ambulate. RW used and safe technique. Pain limited further distance. Good progress with HEP and ROM. Will continue to progress.   Recommendations for follow up therapy are one component of a multi-disciplinary discharge planning process, led by the attending physician.  Recommendations may be updated based on patient status, additional functional criteria and insurance authorization.  Follow Up Recommendations  Skilled nursing-short term rehab (<3 hours/day) Can patient physically be transported by private vehicle: Yes   Assistance Recommended at Discharge Intermittent Supervision/Assistance  Patient can return home with the following A little help with walking and/or transfers;A little help with bathing/dressing/bathroom;Help with stairs or ramp for entrance;Assist for transportation   Equipment Recommendations  Rolling walker (2 wheels)    Recommendations for Other Services       Precautions / Restrictions Precautions Precautions: Knee Precaution Booklet Issued: Yes (comment) Restrictions Weight Bearing Restrictions: Yes LLE Weight Bearing: Weight bearing as tolerated     Mobility  Bed Mobility               General bed mobility comments: NT, received in recliner    Transfers Overall transfer level: Needs assistance Equipment used: Rolling walker (2 wheels) Transfers: Sit to/from Stand Sit to Stand: Min guard            General transfer comment: able to push from seated surface. Once standing, upright posture noted    Ambulation/Gait Ambulation/Gait assistance: Min guard Gait Distance (Feet): 50 Feet Assistive device: Rolling walker (2 wheels) Gait Pattern/deviations: Step-to pattern       General Gait Details: slow gait speed, however no rest breaks. RW used with safe technique. No dizziness   Stairs             Wheelchair Mobility    Modified Rankin (Stroke Patients Only)       Balance Overall balance assessment: Needs assistance Sitting-balance support: No upper extremity supported, Feet supported Sitting balance-Leahy Scale: Good     Standing balance support: Bilateral upper extremity supported, During functional activity, Reliant on assistive device for balance Standing balance-Leahy Scale: Fair                              Cognition Arousal/Alertness: Awake/alert Behavior During Therapy: WFL for tasks assessed/performed Overall Cognitive Status: Within Functional Limits for tasks assessed                                 General Comments: agreeable to therapy despite pain        Exercises Total Joint Exercises Goniometric ROM: L knee AAROM: 1-92 degrees Other Exercises Other Exercises: seated ther-ex performed on L LE including AP, quad sets, hip abd/add, SLRs, knee flexion stretches, and LAQ. 12 reps with cga    General Comments        Pertinent Vitals/Pain Pain Assessment Pain Assessment: 0-10  Pain Score: 8  Pain Location: L knee Pain Descriptors / Indicators: Grimacing, Guarding, Constant, Aching, Tender Pain Intervention(s): Limited activity within patient's tolerance, Relaxation, Patient requesting pain meds-RN notified, Ice applied    Home Living                          Prior Function            PT Goals (current goals can now be found in the care plan section) Acute Rehab PT Goals Patient Stated Goal: to  go home PT Goal Formulation: With patient Time For Goal Achievement: 10/24/21 Potential to Achieve Goals: Good Progress towards PT goals: Progressing toward goals    Frequency    BID      PT Plan Current plan remains appropriate    Co-evaluation              AM-PAC PT "6 Clicks" Mobility   Outcome Measure  Help needed turning from your back to your side while in a flat bed without using bedrails?: A Little Help needed moving from lying on your back to sitting on the side of a flat bed without using bedrails?: A Little Help needed moving to and from a bed to a chair (including a wheelchair)?: A Little Help needed standing up from a chair using your arms (e.g., wheelchair or bedside chair)?: A Little Help needed to walk in hospital room?: A Little Help needed climbing 3-5 steps with a railing? : A Lot 6 Click Score: 17    End of Session Equipment Utilized During Treatment: Gait belt Activity Tolerance: Patient limited by pain Patient left: in chair;with chair alarm set Nurse Communication: Mobility status;Precautions;Weight bearing status PT Visit Diagnosis: Unsteadiness on feet (R26.81);Other abnormalities of gait and mobility (R26.89);Muscle weakness (generalized) (M62.81);History of falling (Z91.81);Pain Pain - Right/Left: Left Pain - part of body: Knee     Time: 0102-7253 PT Time Calculation (min) (ACUTE ONLY): 25 min  Charges:  $Gait Training: 8-22 mins $Therapeutic Exercise: 8-22 mins                     Greggory Stallion, PT, DPT, GCS [phone]    Michele Moore 10/13/2021, 11:04 AM

## 2021-10-14 DIAGNOSIS — Z9181 History of falling: Secondary | ICD-10-CM | POA: Diagnosis not present

## 2021-10-14 DIAGNOSIS — M069 Rheumatoid arthritis, unspecified: Secondary | ICD-10-CM | POA: Diagnosis not present

## 2021-10-14 DIAGNOSIS — Z743 Need for continuous supervision: Secondary | ICD-10-CM | POA: Diagnosis not present

## 2021-10-14 DIAGNOSIS — E1122 Type 2 diabetes mellitus with diabetic chronic kidney disease: Secondary | ICD-10-CM | POA: Diagnosis not present

## 2021-10-14 DIAGNOSIS — R1311 Dysphagia, oral phase: Secondary | ICD-10-CM | POA: Diagnosis not present

## 2021-10-14 DIAGNOSIS — E039 Hypothyroidism, unspecified: Secondary | ICD-10-CM | POA: Diagnosis not present

## 2021-10-14 DIAGNOSIS — G894 Chronic pain syndrome: Secondary | ICD-10-CM | POA: Diagnosis not present

## 2021-10-14 DIAGNOSIS — N183 Chronic kidney disease, stage 3 unspecified: Secondary | ICD-10-CM | POA: Diagnosis not present

## 2021-10-14 DIAGNOSIS — E1169 Type 2 diabetes mellitus with other specified complication: Secondary | ICD-10-CM | POA: Diagnosis not present

## 2021-10-14 DIAGNOSIS — Z4789 Encounter for other orthopedic aftercare: Secondary | ICD-10-CM | POA: Diagnosis not present

## 2021-10-14 DIAGNOSIS — M1612 Unilateral primary osteoarthritis, left hip: Secondary | ICD-10-CM | POA: Diagnosis not present

## 2021-10-14 DIAGNOSIS — M109 Gout, unspecified: Secondary | ICD-10-CM | POA: Diagnosis not present

## 2021-10-14 DIAGNOSIS — I129 Hypertensive chronic kidney disease with stage 1 through stage 4 chronic kidney disease, or unspecified chronic kidney disease: Secondary | ICD-10-CM | POA: Diagnosis not present

## 2021-10-14 DIAGNOSIS — L4052 Psoriatic arthritis mutilans: Secondary | ICD-10-CM | POA: Diagnosis not present

## 2021-10-14 DIAGNOSIS — M47812 Spondylosis without myelopathy or radiculopathy, cervical region: Secondary | ICD-10-CM | POA: Diagnosis not present

## 2021-10-14 DIAGNOSIS — Z471 Aftercare following joint replacement surgery: Secondary | ICD-10-CM | POA: Diagnosis not present

## 2021-10-14 DIAGNOSIS — D631 Anemia in chronic kidney disease: Secondary | ICD-10-CM | POA: Diagnosis not present

## 2021-10-14 DIAGNOSIS — R2689 Other abnormalities of gait and mobility: Secondary | ICD-10-CM | POA: Diagnosis not present

## 2021-10-14 DIAGNOSIS — E876 Hypokalemia: Secondary | ICD-10-CM | POA: Diagnosis not present

## 2021-10-14 DIAGNOSIS — M1712 Unilateral primary osteoarthritis, left knee: Secondary | ICD-10-CM | POA: Diagnosis not present

## 2021-10-14 DIAGNOSIS — Z79899 Other long term (current) drug therapy: Secondary | ICD-10-CM | POA: Diagnosis not present

## 2021-10-14 DIAGNOSIS — Z6835 Body mass index (BMI) 35.0-35.9, adult: Secondary | ICD-10-CM | POA: Diagnosis not present

## 2021-10-14 DIAGNOSIS — K219 Gastro-esophageal reflux disease without esophagitis: Secondary | ICD-10-CM | POA: Diagnosis not present

## 2021-10-14 DIAGNOSIS — M1990 Unspecified osteoarthritis, unspecified site: Secondary | ICD-10-CM | POA: Diagnosis not present

## 2021-10-14 DIAGNOSIS — R52 Pain, unspecified: Secondary | ICD-10-CM | POA: Diagnosis not present

## 2021-10-14 DIAGNOSIS — M2352 Chronic instability of knee, left knee: Secondary | ICD-10-CM | POA: Diagnosis not present

## 2021-10-14 DIAGNOSIS — T84033D Mechanical loosening of internal left knee prosthetic joint, subsequent encounter: Secondary | ICD-10-CM | POA: Diagnosis not present

## 2021-10-14 DIAGNOSIS — R002 Palpitations: Secondary | ICD-10-CM | POA: Diagnosis not present

## 2021-10-14 DIAGNOSIS — R2681 Unsteadiness on feet: Secondary | ICD-10-CM | POA: Diagnosis not present

## 2021-10-14 DIAGNOSIS — M5136 Other intervertebral disc degeneration, lumbar region: Secondary | ICD-10-CM | POA: Diagnosis not present

## 2021-10-14 DIAGNOSIS — I10 Essential (primary) hypertension: Secondary | ICD-10-CM | POA: Diagnosis not present

## 2021-10-14 DIAGNOSIS — D649 Anemia, unspecified: Secondary | ICD-10-CM | POA: Diagnosis not present

## 2021-10-14 DIAGNOSIS — E782 Mixed hyperlipidemia: Secondary | ICD-10-CM | POA: Diagnosis not present

## 2021-10-14 DIAGNOSIS — R41841 Cognitive communication deficit: Secondary | ICD-10-CM | POA: Diagnosis not present

## 2021-10-14 DIAGNOSIS — Z96652 Presence of left artificial knee joint: Secondary | ICD-10-CM | POA: Diagnosis not present

## 2021-10-14 DIAGNOSIS — L4050 Arthropathic psoriasis, unspecified: Secondary | ICD-10-CM | POA: Diagnosis not present

## 2021-10-14 DIAGNOSIS — R69 Illness, unspecified: Secondary | ICD-10-CM | POA: Diagnosis not present

## 2021-10-14 DIAGNOSIS — G5702 Lesion of sciatic nerve, left lower limb: Secondary | ICD-10-CM | POA: Diagnosis not present

## 2021-10-14 DIAGNOSIS — J45909 Unspecified asthma, uncomplicated: Secondary | ICD-10-CM | POA: Diagnosis not present

## 2021-10-14 DIAGNOSIS — M25562 Pain in left knee: Secondary | ICD-10-CM | POA: Diagnosis not present

## 2021-10-14 DIAGNOSIS — G8929 Other chronic pain: Secondary | ICD-10-CM | POA: Diagnosis not present

## 2021-10-14 DIAGNOSIS — E119 Type 2 diabetes mellitus without complications: Secondary | ICD-10-CM | POA: Diagnosis not present

## 2021-10-14 DIAGNOSIS — M47816 Spondylosis without myelopathy or radiculopathy, lumbar region: Secondary | ICD-10-CM | POA: Diagnosis not present

## 2021-10-14 DIAGNOSIS — M62838 Other muscle spasm: Secondary | ICD-10-CM | POA: Diagnosis not present

## 2021-10-14 DIAGNOSIS — M6281 Muscle weakness (generalized): Secondary | ICD-10-CM | POA: Diagnosis not present

## 2021-10-14 DIAGNOSIS — H00016 Hordeolum externum left eye, unspecified eyelid: Secondary | ICD-10-CM | POA: Diagnosis not present

## 2021-10-14 DIAGNOSIS — Z96659 Presence of unspecified artificial knee joint: Secondary | ICD-10-CM | POA: Diagnosis not present

## 2021-10-14 DIAGNOSIS — Z6837 Body mass index (BMI) 37.0-37.9, adult: Secondary | ICD-10-CM | POA: Diagnosis not present

## 2021-10-14 LAB — TYPE AND SCREEN
ABO/RH(D): O POS
Antibody Screen: NEGATIVE
Unit division: 0

## 2021-10-14 LAB — AEROBIC/ANAEROBIC CULTURE W GRAM STAIN (SURGICAL/DEEP WOUND): Culture: NO GROWTH

## 2021-10-14 LAB — CBC
HCT: 25 % — ABNORMAL LOW (ref 36.0–46.0)
Hemoglobin: 8.1 g/dL — ABNORMAL LOW (ref 12.0–15.0)
MCH: 29.7 pg (ref 26.0–34.0)
MCHC: 32.4 g/dL (ref 30.0–36.0)
MCV: 91.6 fL (ref 80.0–100.0)
Platelets: 180 10*3/uL (ref 150–400)
RBC: 2.73 MIL/uL — ABNORMAL LOW (ref 3.87–5.11)
RDW: 16.3 % — ABNORMAL HIGH (ref 11.5–15.5)
WBC: 8.7 10*3/uL (ref 4.0–10.5)
nRBC: 0 % (ref 0.0–0.2)

## 2021-10-14 LAB — BASIC METABOLIC PANEL
Anion gap: 8 (ref 5–15)
BUN: 42 mg/dL — ABNORMAL HIGH (ref 8–23)
CO2: 27 mmol/L (ref 22–32)
Calcium: 9.5 mg/dL (ref 8.9–10.3)
Chloride: 101 mmol/L (ref 98–111)
Creatinine, Ser: 1.64 mg/dL — ABNORMAL HIGH (ref 0.44–1.00)
GFR, Estimated: 33 mL/min — ABNORMAL LOW (ref >60)
Glucose, Bld: 190 mg/dL — ABNORMAL HIGH (ref 70–99)
Potassium: 3.4 mmol/L — ABNORMAL LOW (ref 3.5–5.1)
Sodium: 136 mmol/L (ref 135–145)

## 2021-10-14 LAB — BPAM RBC
Blood Product Expiration Date: 202309252359
ISSUE DATE / TIME: 202308201202
Unit Type and Rh: 5100

## 2021-10-14 LAB — HEMOGLOBIN: Hemoglobin: 9.2 g/dL — ABNORMAL LOW (ref 12.0–15.0)

## 2021-10-14 MED ORDER — POTASSIUM CHLORIDE 20 MEQ PO PACK
20.0000 meq | PACK | Freq: Once | ORAL | Status: AC
  Administered 2021-10-14: 20 meq via ORAL
  Filled 2021-10-14: qty 1

## 2021-10-14 NOTE — Progress Notes (Signed)
Report called to Amy at [facility].

## 2021-10-14 NOTE — Care Management Important Message (Signed)
Important Message  Patient Details  Name: Michele Moore MRN: [id number] Date of Birth: 10/27/1950   Medicare Important Message Given:  Yes     [name] 10/14/2021, 12:24 PM

## 2021-10-14 NOTE — Progress Notes (Signed)
Physical Therapy Treatment Patient Details Name: Michele Moore MRN: [id number] DOB: 01/19/1951 Today's Date: 10/14/2021   History of Present Illness Pt is a 71 y.o. female s/p L knee revision arthroplasty with polyethylene exchange 10/09/21 secondary to L knee instability s/p L TKA.  PMH includes L TKA 07/10/2009; L knee polyethylene exchange 01/02/2011; pt also reports h/o 2 R knee surgeries.    PT Comments    Pt resting in recliner upon PT arrival; agreeable to PT session.  Pain 6/10 L knee throughout session (pt reports recent pain meds).  L knee AAROM 1-95 degrees.  Tolerated LE ex's fairly well with assist for L LE as needed.  Able to stand and ambulate with RW 50 feet with CGA.  Will continue to focus on strengthening, balance, knee ROM, and progressive functional mobility during hospitalization.    Recommendations for follow up therapy are one component of a multi-disciplinary discharge planning process, led by the attending physician.  Recommendations may be updated based on patient status, additional functional criteria and insurance authorization.  Follow Up Recommendations  Skilled nursing-short term rehab (<3 hours/day) Can patient physically be transported by private vehicle: Yes   Assistance Recommended at Discharge Intermittent Supervision/Assistance  Patient can return home with the following A little help with walking and/or transfers;A little help with bathing/dressing/bathroom;Help with stairs or ramp for entrance;Assist for transportation   Equipment Recommendations  Rolling walker (2 wheels)    Recommendations for Other Services OT consult     Precautions / Restrictions Precautions Precautions: Knee Precaution Booklet Issued: Yes (comment) Restrictions Weight Bearing Restrictions: Yes LLE Weight Bearing: Weight bearing as tolerated     Mobility  Bed Mobility               General bed mobility comments: Deferred (pt in recliner beginning/end of session)     Transfers Overall transfer level: Needs assistance Equipment used: Rolling walker (2 wheels) Transfers: Sit to/from Stand Sit to Stand: Min guard           General transfer comment: increased effort to stand from recliner; occasional vc's for LE placement    Ambulation/Gait Ambulation/Gait assistance: Min guard Gait Distance (Feet): 50 Feet Assistive device: Rolling walker (2 wheels)   Gait velocity: decreased     General Gait Details: antalgic; decreased stance time L LE; partial step through gait pattern   Stairs             Wheelchair Mobility    Modified Rankin (Stroke Patients Only)       Balance Overall balance assessment: Needs assistance Sitting-balance support: No upper extremity supported, Feet supported Sitting balance-Leahy Scale: Good Sitting balance - Comments: steady sitting reaching within BOS   Standing balance support: Bilateral upper extremity supported, During functional activity, Reliant on assistive device for balance Standing balance-Leahy Scale: Fair Standing balance comment: steady ambulating with RW use                            Cognition Arousal/Alertness: Awake/alert Behavior During Therapy: WFL for tasks assessed/performed Overall Cognitive Status: Within Functional Limits for tasks assessed                                          Exercises Total Joint Exercises Ankle Circles/Pumps: AROM, Strengthening, Both, 10 reps, Supine Quad Sets: AROM, Strengthening, Both, 10 reps, Supine Hip ABduction/ADduction:  AAROM, Strengthening, Left, 10 reps, Supine Straight Leg Raises: AAROM, Strengthening, Left, 10 reps, Supine Goniometric ROM: L knee AAROM 1-95 degrees    General Comments  Nursing cleared pt for participation in physical therapy.  Pt agreeable to PT session.      Pertinent Vitals/Pain Pain Assessment Pain Assessment: 0-10 Pain Score: 6  Pain Location: L knee Pain Descriptors /  Indicators: Grimacing, Guarding, Constant, Aching, Tender Pain Intervention(s): Limited activity within patient's tolerance, Monitored during session, Premedicated before session, Repositioned, Other (comment) (polar care applied) Vitals (HR and O2 on room air) stable and WFL throughout treatment session.    Home Living                          Prior Function            PT Goals (current goals can now be found in the care plan section) Acute Rehab PT Goals Patient Stated Goal: to go home PT Goal Formulation: With patient Time For Goal Achievement: 10/24/21 Potential to Achieve Goals: Good Progress towards PT goals: Progressing toward goals    Frequency    BID      PT Plan Current plan remains appropriate    Co-evaluation              AM-PAC PT "6 Clicks" Mobility   Outcome Measure  Help needed turning from your back to your side while in a flat bed without using bedrails?: A Little Help needed moving from lying on your back to sitting on the side of a flat bed without using bedrails?: A Little Help needed moving to and from a bed to a chair (including a wheelchair)?: A Little Help needed standing up from a chair using your arms (e.g., wheelchair or bedside chair)?: A Little Help needed to walk in hospital room?: A Little Help needed climbing 3-5 steps with a railing? : A Lot 6 Click Score: 17    End of Session Equipment Utilized During Treatment: Gait belt Activity Tolerance: Patient limited by pain Patient left: in chair;with chair alarm set;with call bell/phone within reach;Other (comment) (B heels floating; lab present) Nurse Communication: Mobility status;Precautions;Weight bearing status PT Visit Diagnosis: Unsteadiness on feet (R26.81);Other abnormalities of gait and mobility (R26.89);Muscle weakness (generalized) (M62.81);History of falling (Z91.81);Pain Pain - Right/Left: Left Pain - part of body: Knee     Time: 2025-4270 PT Time  Calculation (min) (ACUTE ONLY): 38 min  Charges:  $Gait Training: 8-22 mins $Therapeutic Exercise: 8-22 mins $Therapeutic Activity: 8-22 mins                     Leitha Bleak, PT 10/14/21, 11:54 AM

## 2021-10-14 NOTE — TOC Transition Note (Addendum)
Transition of Care [facility]) - CM/SW Discharge Note   Patient Details  Name: Michele Moore MRN: [id number] Date of Birth: May 19, 1950  [facility] CM/SW Contact:  Laurena Slimmer, RN Phone Number: 10/14/2021, 12:56 PM   Clinical Narrative:    Spoke with patient at bedside to inform of discharge today to [facility]. Nurse notified of room number and where to call report. Discharge summary and SNF transfer report sent in [place].EMS packet arranged. EMS transport scheduled. TOC signing off.          Patient Goals and CMS Choice        Discharge Placement                       Discharge Plan and Services                                     Social Determinants of Health (SDOH) Interventions     Readmission Risk Interventions     No data to display

## 2021-10-14 NOTE — Progress Notes (Signed)
Subjective: 5 Days Post-Op Procedure(s) (LRB): LEFT TOTAL KNEE REVISION WITH POLYETHYLENE EXCHANGE. (Left) Patient reports pain as mild this morning but did report moderate pain with therapy yesterday. Patient is well, and has had no acute complaints or problems Received 1 unit of transfused blood Friday and one additional unit yesterday, 10/13/21 Plan is to go Rehab after hospital stay.  Currently waiting on approval from insurance. Negative for chest pain and shortness of breath Fever: no Gastrointestinal: Negative for nausea and vomiting  Objective: Vital signs in last 24 hours: Temp:  [97.6 F (36.4 C)-98.6 F (37 C)] 98.1 F (36.7 C) (08/21 0300) Pulse Rate:  [59-66] 59 (08/21 0300) Resp:  [18] 18 (08/21 0300) BP: (123-156)/(59-88) 123/68 (08/21 0300) SpO2:  [97 %-100 %] 97 % (08/21 0300)  Intake/Output from previous day:  Intake/Output Summary (Last 24 hours) at 10/14/2021 0910 Last data filed at 10/13/2021 1444 Gross per 24 hour  Intake 371 ml  Output --  Net 371 ml     Intake/Output this shift: No intake/output data recorded.  Labs: Recent Labs    10/11/21 1636 10/12/21 0638 10/13/21 0757 10/13/21 1751 10/14/21 0319  HGB 7.3* 7.0* 6.9* 8.8* 8.1*   Recent Labs    10/13/21 1751 10/14/21 0319  WBC  --  8.7  RBC  --  2.73*  HCT 27.0* 25.0*  PLT  --  180   Recent Labs    10/12/21 0638 10/14/21 0319  NA 137 136  K 4.7 3.4*  CL 107 101  CO2 25 27  BUN 49* 42*  CREATININE 2.14* 1.64*  GLUCOSE 137* 190*  CALCIUM 9.0 9.5   No results for input(s): "LABPT", "INR" in the last 72 hours.  EXAM General - Patient is Alert and Oriented Extremity - Neurovascular intact Sensation intact distally Dorsiflexion/Plantar flexion intact Compartment soft Dressing/Incision - Mild bloody drainage to the midportion of the knee incision. Motor Function - intact, moving foot and toes well on exam.  Patient ambulated 50 feet with physical therapy at most recent PT  session.  Past Medical History:  Diagnosis Date   (HFpEF) heart failure with preserved ejection fraction ([place]) 01/13/2014   a.) TTE 01/13/2014: EF 45%, apical HK, mod LVH, mild MAC, mild LAE/RVE, mod MR/TR/PR; b.) TTE 10/01/2018: EF 55-60%, mild MVH, mild LAE, triv MR/TR, G1DD   Allergy    ANA positive    Anemia of chronic renal failure, stage 3 (moderate), unspecified whether stage 3a or 3b CKD (HCC)    Aortic atherosclerosis (HCC)    Asthma    Cataract    Chronic pain syndrome    a.) followed by [facility], [facility])   Chronic, continuous use of opioids    CKD (chronic kidney disease), stage III ([place])    Complication of anesthesia    a.) anesthesia awareness/premature emergence during colonoscopy   DDD (degenerative disc disease), lumbar    Dyspnea on exertion    Dysrhythmia    IRREGULAR HEART BEAT   Family history of adverse reaction to anesthesia    sister difficult to put to sleep   GERD (gastroesophageal reflux disease)    Glaucoma    Gout    H/O tooth extraction    all lower teeth 1/19   Hemorrhoid    History of hiatal hernia    History of kidney stones    Hypertension    Hypothyroidism    Implantable loop recorder present 08/22/2021   a.) s/p implantation of [employer] LINQ Reveal ILR; serial #:  [id number]   Long term current use of immunosuppressive drug    a.) MTX + apremilast   Migraines    Mixed hyperlipidemia    Multiple gastric ulcers    Osteoarthritis    Psoriasis    Psoriatic arthritis ([place])    a.) on apremilast   Rheumatoid arthritis (HCC)    a.) on MTX   SS-A antibody positive    Wears dentures    partial upper and lower    Assessment/Plan: 5 Days Post-Op Procedure(s) (LRB): LEFT TOTAL KNEE REVISION WITH POLYETHYLENE EXCHANGE. (Left) Principal Problem:   S/P revision of total knee  Estimated body mass index is 36.26 kg/m as calculated from the following:   Height as of this encounter: _0  (1.803 m).   Weight as of this  encounter: 117.9 kg. Advance diet Up with therapy  Acute blood loss anemia, received 1 unit of PRBC yesterday.  Hg 8.1 this morning.  WIll obtain updated hemoglobin at noon today. K+ 3.4, supplement has been ordered. Patient has had a BM. Plan is for discharge to SNF pending insurance approval. Possible d/c to SNF today.  DVT Prophylaxis - Lovenox, Foot Pumps, and TED hose Weight-Bearing as tolerated to left leg  J. Cameron Proud, PA-C Orthopaedic Surgery 10/14/2021, 9:10 AM

## 2021-10-14 NOTE — TOC Progression Note (Addendum)
Transition of Care [facility]) - Progression Note    Patient Details  Name: Michele Moore MRN: [id number] Date of Birth: 1950-03-21  [facility] CM/SW Contact  Laurena Slimmer, RN Phone Number: 10/14/2021, 11:09 AM  Clinical Narrative:    Bed offer for [facility] still pending. Contacted Ricky in admissions at [facility] to determine if bed will be offered. Bed offer request expedited by admissions. Will follow up.   12:15pm Spoke with Audry Pili from [facility]. Patient will be accepted today. Attending MD notified.        Expected Discharge Plan and Services                                                 Social Determinants of Health (SDOH) Interventions    Readmission Risk Interventions     No data to display

## 2021-10-14 NOTE — Plan of Care (Signed)
?  Problem: Education: ?Goal: Knowledge of the prescribed therapeutic regimen will improve ?Outcome: Progressing ?Goal: Individualized Educational Video(s) ?Outcome: Progressing ?  ?Problem: Activity: ?Goal: Ability to avoid complications of mobility impairment will improve ?Outcome: Progressing ?Goal: Range of joint motion will improve ?Outcome: Progressing ?  ?Problem: Clinical Measurements: ?Goal: Postoperative complications will be avoided or minimized ?Outcome: Progressing ?  ?Problem: Pain Management: ?Goal: Pain level will decrease with appropriate interventions ?Outcome: Progressing ?  ?Problem: Skin Integrity: ?Goal: Will show signs of wound healing ?Outcome: Progressing ?  ?Problem: Education: ?Goal: Knowledge of General Education information will improve ?Description: Including pain rating scale, medication(s)/side effects and non-pharmacologic comfort measures ?Outcome: Progressing ?  ?Problem: Health Behavior/Discharge Planning: ?Goal: Ability to manage health-related needs will improve ?Outcome: Progressing ?  ?Problem: Clinical Measurements: ?Goal: Ability to maintain clinical measurements within normal limits will improve ?Outcome: Progressing ?Goal: Will remain free from infection ?Outcome: Progressing ?Goal: Diagnostic test results will improve ?Outcome: Progressing ?Goal: Respiratory complications will improve ?Outcome: Progressing ?Goal: Cardiovascular complication will be avoided ?Outcome: Progressing ?  ?Problem: Activity: ?Goal: Risk for activity intolerance will decrease ?Outcome: Progressing ?  ?Problem: Nutrition: ?Goal: Adequate nutrition will be maintained ?Outcome: Progressing ?  ?Problem: Coping: ?Goal: Level of anxiety will decrease ?Outcome: Progressing ?  ?Problem: Elimination: ?Goal: Will not experience complications related to bowel motility ?Outcome: Progressing ?Goal: Will not experience complications related to urinary retention ?Outcome: Progressing ?  ?Problem: Pain  Managment: ?Goal: General experience of comfort will improve ?Outcome: Progressing ?  ?Problem: Safety: ?Goal: Ability to remain free from injury will improve ?Outcome: Progressing ?  ?Problem: Skin Integrity: ?Goal: Risk for impaired skin integrity will decrease ?Outcome: Progressing ?  ?

## 2021-10-15 DIAGNOSIS — E782 Mixed hyperlipidemia: Secondary | ICD-10-CM | POA: Diagnosis not present

## 2021-10-15 DIAGNOSIS — G894 Chronic pain syndrome: Secondary | ICD-10-CM | POA: Diagnosis not present

## 2021-10-15 DIAGNOSIS — M109 Gout, unspecified: Secondary | ICD-10-CM | POA: Diagnosis not present

## 2021-10-15 DIAGNOSIS — M47816 Spondylosis without myelopathy or radiculopathy, lumbar region: Secondary | ICD-10-CM | POA: Diagnosis not present

## 2021-10-15 DIAGNOSIS — M1990 Unspecified osteoarthritis, unspecified site: Secondary | ICD-10-CM | POA: Diagnosis not present

## 2021-10-15 DIAGNOSIS — K219 Gastro-esophageal reflux disease without esophagitis: Secondary | ICD-10-CM | POA: Diagnosis not present

## 2021-10-15 DIAGNOSIS — T84033D Mechanical loosening of internal left knee prosthetic joint, subsequent encounter: Secondary | ICD-10-CM | POA: Diagnosis not present

## 2021-10-15 DIAGNOSIS — L4050 Arthropathic psoriasis, unspecified: Secondary | ICD-10-CM | POA: Diagnosis not present

## 2021-10-15 DIAGNOSIS — E1169 Type 2 diabetes mellitus with other specified complication: Secondary | ICD-10-CM | POA: Diagnosis not present

## 2021-10-15 DIAGNOSIS — J45909 Unspecified asthma, uncomplicated: Secondary | ICD-10-CM | POA: Diagnosis not present

## 2021-10-15 DIAGNOSIS — D631 Anemia in chronic kidney disease: Secondary | ICD-10-CM | POA: Diagnosis not present

## 2021-10-15 DIAGNOSIS — E039 Hypothyroidism, unspecified: Secondary | ICD-10-CM | POA: Diagnosis not present

## 2021-10-15 DIAGNOSIS — E876 Hypokalemia: Secondary | ICD-10-CM | POA: Diagnosis not present

## 2021-10-15 DIAGNOSIS — I129 Hypertensive chronic kidney disease with stage 1 through stage 4 chronic kidney disease, or unspecified chronic kidney disease: Secondary | ICD-10-CM | POA: Diagnosis not present

## 2021-10-15 DIAGNOSIS — Z6837 Body mass index (BMI) 37.0-37.9, adult: Secondary | ICD-10-CM | POA: Diagnosis not present

## 2021-10-15 DIAGNOSIS — E1122 Type 2 diabetes mellitus with diabetic chronic kidney disease: Secondary | ICD-10-CM | POA: Diagnosis not present

## 2021-10-15 DIAGNOSIS — N183 Chronic kidney disease, stage 3 unspecified: Secondary | ICD-10-CM | POA: Diagnosis not present

## 2021-10-15 DIAGNOSIS — M069 Rheumatoid arthritis, unspecified: Secondary | ICD-10-CM | POA: Diagnosis not present

## 2021-10-18 NOTE — Progress Notes (Signed)
Carelink Summary Report / Loop Recorder 

## 2021-10-22 DIAGNOSIS — M62838 Other muscle spasm: Secondary | ICD-10-CM | POA: Diagnosis not present

## 2021-10-23 ENCOUNTER — Inpatient Hospital Stay: Payer: Medicare Other

## 2021-10-24 ENCOUNTER — Inpatient Hospital Stay: Payer: Medicare Other

## 2021-10-24 ENCOUNTER — Ambulatory Visit: Payer: Medicare Other

## 2021-10-24 VITALS — BP 116/72 | HR 61

## 2021-10-24 DIAGNOSIS — I129 Hypertensive chronic kidney disease with stage 1 through stage 4 chronic kidney disease, or unspecified chronic kidney disease: Secondary | ICD-10-CM | POA: Diagnosis not present

## 2021-10-24 DIAGNOSIS — N183 Chronic kidney disease, stage 3 unspecified: Secondary | ICD-10-CM | POA: Diagnosis not present

## 2021-10-24 DIAGNOSIS — D631 Anemia in chronic kidney disease: Secondary | ICD-10-CM

## 2021-10-24 DIAGNOSIS — D649 Anemia, unspecified: Secondary | ICD-10-CM

## 2021-10-24 DIAGNOSIS — Z79899 Other long term (current) drug therapy: Secondary | ICD-10-CM | POA: Diagnosis not present

## 2021-10-24 LAB — HEMOGLOBIN AND HEMATOCRIT, BLOOD
HCT: 25.4 % — ABNORMAL LOW (ref 36.0–46.0)
Hemoglobin: 8.3 g/dL — ABNORMAL LOW (ref 12.0–15.0)

## 2021-10-24 MED ORDER — EPOETIN ALFA-EPBX 20000 UNIT/ML IJ SOLN
20000.0000 [IU] | Freq: Once | INTRAMUSCULAR | Status: AC
  Administered 2021-10-24: 20000 [IU] via SUBCUTANEOUS
  Filled 2021-10-24: qty 1

## 2021-10-29 ENCOUNTER — Ambulatory Visit (INDEPENDENT_AMBULATORY_CARE_PROVIDER_SITE_OTHER): Payer: Medicare Other

## 2021-10-29 DIAGNOSIS — R002 Palpitations: Secondary | ICD-10-CM | POA: Diagnosis not present

## 2021-10-30 DIAGNOSIS — Z471 Aftercare following joint replacement surgery: Secondary | ICD-10-CM | POA: Diagnosis not present

## 2021-10-30 DIAGNOSIS — H00016 Hordeolum externum left eye, unspecified eyelid: Secondary | ICD-10-CM | POA: Diagnosis not present

## 2021-10-30 DIAGNOSIS — Z96652 Presence of left artificial knee joint: Secondary | ICD-10-CM | POA: Diagnosis not present

## 2021-10-30 DIAGNOSIS — M2352 Chronic instability of knee, left knee: Secondary | ICD-10-CM | POA: Diagnosis not present

## 2021-10-31 LAB — CUP PACEART REMOTE DEVICE CHECK
Date Time Interrogation Session: 20230907082257
Implantable Pulse Generator Implant Date: 20230629

## 2021-11-04 ENCOUNTER — Telehealth: Payer: Self-pay

## 2021-11-04 ENCOUNTER — Telehealth: Payer: Self-pay | Admitting: Student in an Organized Health Care Education/Training Program

## 2021-11-04 NOTE — Telephone Encounter (Signed)
Patient called asking to have VV. She had knee replacement on 8-16, didn't schedule transportation but now daughter cannot bring her.  Sent Dr. Holley Raring a bubble.

## 2021-11-04 NOTE — Telephone Encounter (Signed)
Lm for patient to call office for pre virtual appointment questions.  

## 2021-11-05 ENCOUNTER — Ambulatory Visit
Payer: Medicare Other | Attending: Student in an Organized Health Care Education/Training Program | Admitting: Student in an Organized Health Care Education/Training Program

## 2021-11-05 ENCOUNTER — Telehealth: Payer: Self-pay

## 2021-11-05 DIAGNOSIS — M1712 Unilateral primary osteoarthritis, left knee: Secondary | ICD-10-CM

## 2021-11-05 DIAGNOSIS — M5136 Other intervertebral disc degeneration, lumbar region: Secondary | ICD-10-CM | POA: Diagnosis not present

## 2021-11-05 DIAGNOSIS — M1612 Unilateral primary osteoarthritis, left hip: Secondary | ICD-10-CM

## 2021-11-05 DIAGNOSIS — M47812 Spondylosis without myelopathy or radiculopathy, cervical region: Secondary | ICD-10-CM | POA: Diagnosis not present

## 2021-11-05 DIAGNOSIS — G894 Chronic pain syndrome: Secondary | ICD-10-CM | POA: Diagnosis not present

## 2021-11-05 DIAGNOSIS — G8929 Other chronic pain: Secondary | ICD-10-CM

## 2021-11-05 DIAGNOSIS — M25562 Pain in left knee: Secondary | ICD-10-CM

## 2021-11-05 DIAGNOSIS — Z96652 Presence of left artificial knee joint: Secondary | ICD-10-CM | POA: Diagnosis not present

## 2021-11-05 DIAGNOSIS — M47816 Spondylosis without myelopathy or radiculopathy, lumbar region: Secondary | ICD-10-CM | POA: Diagnosis not present

## 2021-11-05 MED ORDER — HYDROCODONE-ACETAMINOPHEN 7.5-325 MG PO TABS: 1.0000 | ORAL_TABLET | Freq: Three times a day (TID) | ORAL | 0 refills | Status: AC | PRN

## 2021-11-05 MED ORDER — HYDROCODONE-ACETAMINOPHEN 7.5-325 MG PO TABS: 1.0000 | ORAL_TABLET | Freq: Three times a day (TID) | ORAL | 0 refills | Status: DC | PRN

## 2021-11-05 MED ORDER — TIZANIDINE HCL 4 MG PO TABS: 4.0000 mg | ORAL_TABLET | Freq: Every day | ORAL | 5 refills | Status: DC

## 2021-11-05 NOTE — Progress Notes (Signed)
Patient: Michele Moore  Service Category: E/M  Provider: Gillis Santa, MD  DOB: 03-29-1950  DOS: 11/05/2021  Location: Office  MRN: [id number]  Setting: Ambulatory outpatient  Referring Provider: Juline Patch, MD  Type: Established Patient  Specialty: Interventional Pain Management  PCP: Juline Patch, MD  Location: Remote location  Delivery: TeleHealth     Virtual Encounter - Pain Management PROVIDER NOTE: Information contained herein reflects review and annotations entered in association with encounter. Interpretation of such information and data should be left to medically-trained personnel. Information provided to patient can be located elsewhere in the medical record under "Patient Instructions". Document created using STT-dictation technology, any transcriptional errors that may result from process are unintentional.    Contact & Pharmacy Preferred: [place]: [phone] (home) Mobile: [phone] (mobile) E-mail: No e-mail address on record  [facility] [place] [address] [place] [address] Phone: [phone] Fax: [phone]   Pre-screening  Michele Moore offered "in-person" vs "virtual" encounter. She indicated preferring virtual for this encounter.   Reason COVID-19*  Social distancing based on CDC and AMA recommendations.   I contacted Michele Moore on 11/05/2021 via telephone.      I clearly identified myself as Gillis Santa, MD. I verified that I was speaking with the correct person using two identifiers (Name: Michele Moore, and date of birth: Dec 23, 1950).  Consent I sought verbal advanced consent from Michele Moore for virtual visit interactions. I informed Michele Moore of possible security and privacy concerns, risks, and limitations associated with providing "not-in-person" medical evaluation and management services. I also informed Michele Moore of the availability of "in-person" appointments.  Finally, I informed her that there would be a charge for the virtual visit and that she could be  personally, fully or partially, financially responsible for it. Michele Moore expressed understanding and agreed to proceed.   Historic Elements   Michele Moore is a 71 y.o. year old, female patient evaluated today after our last contact on 11/04/2021. Michele Moore  has a past medical history of (HFpEF) heart failure with preserved ejection fraction ([place]) (01/13/2014), Allergy, ANA positive, Anemia of chronic renal failure, stage 3 (moderate), unspecified whether stage 3a or 3b CKD ([place]), Aortic atherosclerosis ([place]), Asthma, Cataract, Chronic pain syndrome, Chronic, continuous use of opioids, CKD (chronic kidney disease), stage III ([employer]), Complication of anesthesia, DDD (degenerative disc disease), lumbar, Dyspnea on exertion, Dysrhythmia, Family history of adverse reaction to anesthesia, GERD (gastroesophageal reflux disease), Glaucoma, Gout, H/O tooth extraction, Hemorrhoid, History of hiatal hernia, History of kidney stones, Hypertension, Hypothyroidism, Implantable loop recorder present (08/22/2021), Long term current use of immunosuppressive drug, Migraines, Mixed hyperlipidemia, Multiple gastric ulcers, Osteoarthritis, Psoriasis, Psoriatic arthritis ([place]), Rheumatoid arthritis ([place]), SS-A antibody positive, and Wears dentures. She also  has a past surgical history that includes Carpal tunnel release; Rotator cuff repair (Right, 1995); Ankle surgery (Right); Tubal ligation; Colonoscopy (2000?); Upper gi endoscopy (2000?); Tonsillectomy; Fusion of talonavicular joint (Right, 08/03/2015); Colonoscopy with propofol (N/A, 10/22/2017); Esophagogastroduodenoscopy (egd) with propofol (N/A, 10/22/2017); polypectomy (N/A, 10/22/2017); Total knee revision (Right, 01/20/2018); Joint replacement (Right); Loop recorder implant (08/22/2021); and Total knee revision (Left, 10/09/2021). Michele Moore has a current medication list which  includes the following prescription(s): acetaminophen, acetaminophen, albuterol, allopurinol, betamethasone dipropionate, betamethasone valerate, calcipotriene, vitamin d3, clobetasol ointment, diclofenac sodium, farxiga, furosemide, gabapentin, hydralazine, [START ON 11/23/2021] hydrocodone-acetaminophen, [START ON 12/23/2021] hydrocodone-acetaminophen, ketoconazole,  levothyroxine, meclizine, methotrexate, metoprolol tartrate, montelukast, multiple vitamins-calcium, mupirocin ointment, nystatin cream, omega-3 acid ethyl esters, omeprazole, otezla, polyethyl glycol-propyl glycol, potassium chloride sa, tramadol, triamcinolone cream, triamterene-hydrochlorothiazide, enoxaparin, ferrous sulfate, tizanidine, [DISCONTINUED] fluticasone, and [DISCONTINUED] mometasone. She  reports that she quit smoking about 28 years ago. Her smoking use included cigarettes. She has a 40.00 pack-year smoking history. She has never used smokeless tobacco. She reports that she does not drink alcohol and does not use drugs. Michele Moore is allergic to ampicillin, penicillins, vancomycin, and vibramycin [doxycycline calcium].   HPI  Today, she is being contacted for medication management.  After surgery status post left knee total arthroplasty with Dr. Marry Guan on 10/09/2021.  Patient is participating in physical therapy.  She continues to endorse edema and postoperative pain along her left knee.  Pharmacotherapy Assessment   Opioid Analgesic: Norco 7.5 mg TID PRN #90/month, 22.5    Monitoring: [place] PMP: PDMP not reviewed this encounter.       Pharmacotherapy: No side-effects or adverse reactions reported. Compliance: No problems identified. Effectiveness: Clinically acceptable. Plan: Refer to "POC". UDS:  Summary  Date Value Ref Range Status  08/06/2021 Note  Final    Comment:    ==================================================================== [employer] [facility]  (MW) ==================================================================== Test                             Result       Flag       Units  Drug Present and Declared for Prescription Verification   Hydrocodone                    1383         EXPECTED   ng/mg creat   Hydromorphone                  260          EXPECTED   ng/mg creat   Norhydrocodone                 557          EXPECTED   ng/mg creat    Sources of hydrocodone include scheduled prescription medications.    Hydromorphone and norhydrocodone are expected metabolites of    hydrocodone. Hydromorphone is also available as a scheduled    prescription medication.  ==================================================================== Test                      Result    Flag   Units      Ref Range   Creatinine              47               mg/dL      >=20 ==================================================================== Declared Medications:  The flagging and interpretation on this report are based on the  following declared medications.  Unexpected results may arise from  inaccuracies in the declared medications.   **Note: The testing scope of this panel includes these medications:   Hydrocodone (Norco)   **Note: The testing scope of this panel does not include the  following reported medications:   Acetaminophen (Norco)  Albuterol (Ventolin HFA)  Allopurinol (Zyloprim)  Apremilast  Betamethasone  Calcipotriene  Clobetasol (Temovate)  Dapagliflozin (Farxiga)  Fish Oil  Fluticasone (Flonase)  Furosemide (Lasix)  Gabapentin (Neurontin)  Hydralazine (Apresoline)  Hydrochlorothiazide (Maxzide)  Iron  Ketoconazole (Nizoral)  Levothyroxine (Synthroid)  Losartan (Cozaar)  Meclizine (Antivert)  Methotrexate  Mometasone (Nasonex)  Montelukast (Singulair)  Multivitamin  Mupirocin (Bactroban)  Omeprazole (Prilosec)  Polyethylene Glycol  Potassium (Klor-Con)  Tizanidine (Zanaflex)  Topical  Topical Diclofenac  (Voltaren)  Triamcinolone (Kenalog)  Triamterene (Maxzide)  Vitamin D3 ==================================================================== For clinical consultation, please call [phone]. ====================================================================    No results found for: "CBDTHCR", "D8THCCBX", "D9THCCBX"   Laboratory Chemistry Profile   Renal Lab Results  Component Value Date   BUN 42 (H) 10/14/2021   CREATININE 1.64 (H) 10/14/2021   BCR 15 09/16/2021   GFRAA 30 (L) 08/28/2019   GFRNONAA 33 (L) 10/14/2021    Hepatic Lab Results  Component Value Date   AST 23 09/27/2021   ALT 13 09/27/2021   ALBUMIN 4.2 09/27/2021   ALKPHOS 74 09/27/2021    Electrolytes Lab Results  Component Value Date   NA 136 10/14/2021   K 3.4 (L) 10/14/2021   CL 101 10/14/2021   CALCIUM 9.5 10/14/2021   MG 2.0 10/01/2018   PHOS 3.0 09/16/2021    Bone No results found for: "VD25OH", "VD125OH2TOT", "LJ4492EF0", "OF1219XJ8", "25OHVITD1", "25OHVITD2", "25OHVITD3", "TESTOFREE", "TESTOSTERONE"  Inflammation (CRP: Acute Phase) (ESR: Chronic Phase) Lab Results  Component Value Date   CRP 1.6 (H) 09/27/2021   ESRSEDRATE 79 (H) 09/27/2021         Note: Above Lab results reviewed.  Imaging  CUP PACEART REMOTE DEVICE CHECK ILR summary report received. Battery status OK. Normal device function. No new tachy, brady, or pause episodes. One previously viewed and reviewed AF episode that was 40 minutes. AF burden is 0.9% of the time.  There were 6 symptom events detected that  were SR.  Monthly summary reports and ROV/PRN  Kathy Breach, RN, CCDS, [employer]  Assessment  The primary encounter diagnosis was Arthritis of left knee. Diagnoses of Chronic knee pain after total replacement of left knee joint, Lumbar spondylosis, Lumbar degenerative disc disease, Primary osteoarthritis of left hip, Chronic pain syndrome, and Cervical spondylosis without myelopathy were also pertinent to  this visit.  Plan of Care    [name] has a current medication list which includes the following long-term medication(s): albuterol, allopurinol, gabapentin, hydralazine, levothyroxine, metoprolol tartrate, montelukast, omega-3 acid ethyl esters, omeprazole, potassium chloride sa, triamterene-hydrochlorothiazide, enoxaparin, ferrous sulfate, [DISCONTINUED] fluticasone, and [DISCONTINUED] mometasone.  Pharmacotherapy (Medications Ordered): Meds ordered this encounter  Medications   HYDROcodone-acetaminophen (NORCO) 7.5-325 MG tablet    Sig: Take 1 tablet by mouth every 8 (eight) hours as needed for severe pain. Must last 30 days.    Dispense:  90 tablet    Refill:  0    Chronic Pain: STOP Act (Not applicable) Fill 1 day early if closed on refill date. Avoid benzodiazepines within 8 hours of opioids   HYDROcodone-acetaminophen (NORCO) 7.5-325 MG tablet    Sig: Take 1 tablet by mouth every 8 (eight) hours as needed for severe pain. Must last 30 days.    Dispense:  90 tablet    Refill:  0    Chronic Pain: STOP Act (Not applicable) Fill 1 day early if closed on refill date. Avoid benzodiazepines within 8 hours of opioids   tiZANidine (ZANAFLEX) 4 MG tablet    Sig: Take 1 tablet (4 mg total) by mouth at bedtime.    Dispense:  60 tablet    Refill:  5   I spent a total of 30 minutes reviewing chart data, face-to-face evaluation with the patient, counseling and coordination of care as detailed  above.   Follow-up plan:   Return in about 9 weeks (around 01/07/2022) for Medication Management, in person.        Recent Visits No visits were found meeting these conditions. Showing recent visits within past 90 days and meeting all other requirements Today's Visits Date Type Provider Dept  11/05/21 Office Visit Gillis Santa, MD [facility]  Showing today's visits and meeting all other requirements Future Appointments No visits were found meeting these conditions. Showing  future appointments within next 90 days and meeting all other requirements  I discussed the assessment and treatment plan with the patient. The patient was provided an opportunity to ask questions and all were answered. The patient agreed with the plan and demonstrated an understanding of the instructions.  Patient advised to call back or seek an in-person evaluation if the symptoms or condition worsens.  Duration of encounter: 58mnutes.  Note by: BGillis Santa MD Date: 11/05/2021; Time: 3:43 PM

## 2021-11-07 DIAGNOSIS — M47816 Spondylosis without myelopathy or radiculopathy, lumbar region: Secondary | ICD-10-CM | POA: Diagnosis not present

## 2021-11-07 DIAGNOSIS — G894 Chronic pain syndrome: Secondary | ICD-10-CM | POA: Diagnosis not present

## 2021-11-07 DIAGNOSIS — E039 Hypothyroidism, unspecified: Secondary | ICD-10-CM | POA: Diagnosis not present

## 2021-11-07 DIAGNOSIS — J45909 Unspecified asthma, uncomplicated: Secondary | ICD-10-CM | POA: Diagnosis not present

## 2021-11-07 DIAGNOSIS — Z96652 Presence of left artificial knee joint: Secondary | ICD-10-CM | POA: Diagnosis not present

## 2021-11-07 DIAGNOSIS — E782 Mixed hyperlipidemia: Secondary | ICD-10-CM | POA: Diagnosis not present

## 2021-11-07 DIAGNOSIS — E1169 Type 2 diabetes mellitus with other specified complication: Secondary | ICD-10-CM | POA: Diagnosis not present

## 2021-11-07 DIAGNOSIS — I129 Hypertensive chronic kidney disease with stage 1 through stage 4 chronic kidney disease, or unspecified chronic kidney disease: Secondary | ICD-10-CM | POA: Diagnosis not present

## 2021-11-07 DIAGNOSIS — L4050 Arthropathic psoriasis, unspecified: Secondary | ICD-10-CM | POA: Diagnosis not present

## 2021-11-07 DIAGNOSIS — Z471 Aftercare following joint replacement surgery: Secondary | ICD-10-CM | POA: Diagnosis not present

## 2021-11-07 DIAGNOSIS — H00016 Hordeolum externum left eye, unspecified eyelid: Secondary | ICD-10-CM | POA: Diagnosis not present

## 2021-11-07 DIAGNOSIS — M1990 Unspecified osteoarthritis, unspecified site: Secondary | ICD-10-CM | POA: Diagnosis not present

## 2021-11-07 DIAGNOSIS — D631 Anemia in chronic kidney disease: Secondary | ICD-10-CM | POA: Diagnosis not present

## 2021-11-07 DIAGNOSIS — M109 Gout, unspecified: Secondary | ICD-10-CM | POA: Diagnosis not present

## 2021-11-07 DIAGNOSIS — K219 Gastro-esophageal reflux disease without esophagitis: Secondary | ICD-10-CM | POA: Diagnosis not present

## 2021-11-07 DIAGNOSIS — N183 Chronic kidney disease, stage 3 unspecified: Secondary | ICD-10-CM | POA: Diagnosis not present

## 2021-11-07 DIAGNOSIS — I10 Essential (primary) hypertension: Secondary | ICD-10-CM | POA: Diagnosis not present

## 2021-11-07 DIAGNOSIS — Z6835 Body mass index (BMI) 35.0-35.9, adult: Secondary | ICD-10-CM | POA: Diagnosis not present

## 2021-11-07 DIAGNOSIS — M069 Rheumatoid arthritis, unspecified: Secondary | ICD-10-CM | POA: Diagnosis not present

## 2021-11-10 DIAGNOSIS — Z7984 Long term (current) use of oral hypoglycemic drugs: Secondary | ICD-10-CM | POA: Diagnosis not present

## 2021-11-10 DIAGNOSIS — Z9181 History of falling: Secondary | ICD-10-CM | POA: Diagnosis not present

## 2021-11-10 DIAGNOSIS — T8484XD Pain due to internal orthopedic prosthetic devices, implants and grafts, subsequent encounter: Secondary | ICD-10-CM | POA: Diagnosis not present

## 2021-11-10 DIAGNOSIS — Z79891 Long term (current) use of opiate analgesic: Secondary | ICD-10-CM | POA: Diagnosis not present

## 2021-11-10 DIAGNOSIS — G894 Chronic pain syndrome: Secondary | ICD-10-CM | POA: Diagnosis not present

## 2021-11-10 DIAGNOSIS — K219 Gastro-esophageal reflux disease without esophagitis: Secondary | ICD-10-CM | POA: Diagnosis not present

## 2021-11-10 DIAGNOSIS — T84023D Instability of internal left knee prosthesis, subsequent encounter: Secondary | ICD-10-CM | POA: Diagnosis not present

## 2021-11-10 DIAGNOSIS — N1832 Chronic kidney disease, stage 3b: Secondary | ICD-10-CM | POA: Diagnosis not present

## 2021-11-10 DIAGNOSIS — K259 Gastric ulcer, unspecified as acute or chronic, without hemorrhage or perforation: Secondary | ICD-10-CM | POA: Diagnosis not present

## 2021-11-10 DIAGNOSIS — M069 Rheumatoid arthritis, unspecified: Secondary | ICD-10-CM | POA: Diagnosis not present

## 2021-11-10 DIAGNOSIS — E876 Hypokalemia: Secondary | ICD-10-CM | POA: Diagnosis not present

## 2021-11-10 DIAGNOSIS — M1030 Gout due to renal impairment, unspecified site: Secondary | ICD-10-CM | POA: Diagnosis not present

## 2021-11-10 DIAGNOSIS — I509 Heart failure, unspecified: Secondary | ICD-10-CM | POA: Diagnosis not present

## 2021-11-10 DIAGNOSIS — E782 Mixed hyperlipidemia: Secondary | ICD-10-CM | POA: Diagnosis not present

## 2021-11-10 DIAGNOSIS — J45909 Unspecified asthma, uncomplicated: Secondary | ICD-10-CM | POA: Diagnosis not present

## 2021-11-10 DIAGNOSIS — L4050 Arthropathic psoriasis, unspecified: Secondary | ICD-10-CM | POA: Diagnosis not present

## 2021-11-10 DIAGNOSIS — E039 Hypothyroidism, unspecified: Secondary | ICD-10-CM | POA: Diagnosis not present

## 2021-11-10 DIAGNOSIS — M47816 Spondylosis without myelopathy or radiculopathy, lumbar region: Secondary | ICD-10-CM | POA: Diagnosis not present

## 2021-11-10 DIAGNOSIS — E1122 Type 2 diabetes mellitus with diabetic chronic kidney disease: Secondary | ICD-10-CM | POA: Diagnosis not present

## 2021-11-10 DIAGNOSIS — Z6835 Body mass index (BMI) 35.0-35.9, adult: Secondary | ICD-10-CM | POA: Diagnosis not present

## 2021-11-10 DIAGNOSIS — H00016 Hordeolum externum left eye, unspecified eyelid: Secondary | ICD-10-CM | POA: Diagnosis not present

## 2021-11-10 DIAGNOSIS — I130 Hypertensive heart and chronic kidney disease with heart failure and stage 1 through stage 4 chronic kidney disease, or unspecified chronic kidney disease: Secondary | ICD-10-CM | POA: Diagnosis not present

## 2021-11-10 DIAGNOSIS — Z79631 Long term (current) use of antimetabolite agent: Secondary | ICD-10-CM | POA: Diagnosis not present

## 2021-11-10 DIAGNOSIS — D631 Anemia in chronic kidney disease: Secondary | ICD-10-CM | POA: Diagnosis not present

## 2021-11-11 ENCOUNTER — Telehealth: Payer: Self-pay | Admitting: Family Medicine

## 2021-11-11 ENCOUNTER — Telehealth: Payer: Self-pay

## 2021-11-11 ENCOUNTER — Telehealth: Payer: Medicare Other | Admitting: Family Medicine

## 2021-11-11 NOTE — Telephone Encounter (Signed)
Copied from [place] [phone]. Topic: General - Inquiry >> Nov 11, 2021  1:41 PM Devoria Glassing wrote: Reason for CRM: pt has stye on left eye she says is getting worse by the minute.  She said her PT called this morning and had not heard back.  She has no transportation, but has also had knee replacement recently.  Pt scheduled for 4 pm appt by telephone this afternoon.  She is wanting some medicine for the stye.

## 2021-11-11 NOTE — Telephone Encounter (Signed)
I called both Hyman Bower [phone] and Arbie Cookey after speaking to [place] at Dr. Clydell Hakim office. Margaretha Sheffield stated she has not had a message concerning the stye, but will follow up with both Jenny Reichmann and Arbie Cookey after talking with Dr. Marry Guan. I let pt know [facility] will be the one to treat this if needed. I also called and left a message on Cindy's vm stating this as well

## 2021-11-11 NOTE — Telephone Encounter (Signed)
Caller making provider aware that pt has a sty over her lt eye that was treated at rehab  Caller states rehab did not give pt the full dose of antibiotics and she has advised pt to call provider

## 2021-11-13 DIAGNOSIS — G894 Chronic pain syndrome: Secondary | ICD-10-CM | POA: Diagnosis not present

## 2021-11-13 DIAGNOSIS — M47816 Spondylosis without myelopathy or radiculopathy, lumbar region: Secondary | ICD-10-CM | POA: Diagnosis not present

## 2021-11-13 DIAGNOSIS — T84023D Instability of internal left knee prosthesis, subsequent encounter: Secondary | ICD-10-CM | POA: Diagnosis not present

## 2021-11-13 DIAGNOSIS — L4050 Arthropathic psoriasis, unspecified: Secondary | ICD-10-CM | POA: Diagnosis not present

## 2021-11-13 DIAGNOSIS — T8484XD Pain due to internal orthopedic prosthetic devices, implants and grafts, subsequent encounter: Secondary | ICD-10-CM | POA: Diagnosis not present

## 2021-11-13 DIAGNOSIS — M069 Rheumatoid arthritis, unspecified: Secondary | ICD-10-CM | POA: Diagnosis not present

## 2021-11-14 DIAGNOSIS — G894 Chronic pain syndrome: Secondary | ICD-10-CM | POA: Diagnosis not present

## 2021-11-14 DIAGNOSIS — L4050 Arthropathic psoriasis, unspecified: Secondary | ICD-10-CM | POA: Diagnosis not present

## 2021-11-14 DIAGNOSIS — T84023D Instability of internal left knee prosthesis, subsequent encounter: Secondary | ICD-10-CM | POA: Diagnosis not present

## 2021-11-14 DIAGNOSIS — T8484XD Pain due to internal orthopedic prosthetic devices, implants and grafts, subsequent encounter: Secondary | ICD-10-CM | POA: Diagnosis not present

## 2021-11-14 DIAGNOSIS — M47816 Spondylosis without myelopathy or radiculopathy, lumbar region: Secondary | ICD-10-CM | POA: Diagnosis not present

## 2021-11-14 DIAGNOSIS — M069 Rheumatoid arthritis, unspecified: Secondary | ICD-10-CM | POA: Diagnosis not present

## 2021-11-15 DIAGNOSIS — T84023D Instability of internal left knee prosthesis, subsequent encounter: Secondary | ICD-10-CM | POA: Diagnosis not present

## 2021-11-15 DIAGNOSIS — G894 Chronic pain syndrome: Secondary | ICD-10-CM | POA: Diagnosis not present

## 2021-11-15 DIAGNOSIS — M069 Rheumatoid arthritis, unspecified: Secondary | ICD-10-CM | POA: Diagnosis not present

## 2021-11-15 DIAGNOSIS — T8484XD Pain due to internal orthopedic prosthetic devices, implants and grafts, subsequent encounter: Secondary | ICD-10-CM | POA: Diagnosis not present

## 2021-11-15 DIAGNOSIS — M47816 Spondylosis without myelopathy or radiculopathy, lumbar region: Secondary | ICD-10-CM | POA: Diagnosis not present

## 2021-11-15 DIAGNOSIS — L4050 Arthropathic psoriasis, unspecified: Secondary | ICD-10-CM | POA: Diagnosis not present

## 2021-11-18 DIAGNOSIS — G894 Chronic pain syndrome: Secondary | ICD-10-CM | POA: Diagnosis not present

## 2021-11-18 DIAGNOSIS — T8484XD Pain due to internal orthopedic prosthetic devices, implants and grafts, subsequent encounter: Secondary | ICD-10-CM | POA: Diagnosis not present

## 2021-11-18 DIAGNOSIS — T84023D Instability of internal left knee prosthesis, subsequent encounter: Secondary | ICD-10-CM | POA: Diagnosis not present

## 2021-11-18 DIAGNOSIS — L4050 Arthropathic psoriasis, unspecified: Secondary | ICD-10-CM | POA: Diagnosis not present

## 2021-11-18 DIAGNOSIS — M47816 Spondylosis without myelopathy or radiculopathy, lumbar region: Secondary | ICD-10-CM | POA: Diagnosis not present

## 2021-11-18 DIAGNOSIS — M069 Rheumatoid arthritis, unspecified: Secondary | ICD-10-CM | POA: Diagnosis not present

## 2021-11-19 DIAGNOSIS — M069 Rheumatoid arthritis, unspecified: Secondary | ICD-10-CM | POA: Diagnosis not present

## 2021-11-19 DIAGNOSIS — G894 Chronic pain syndrome: Secondary | ICD-10-CM | POA: Diagnosis not present

## 2021-11-19 DIAGNOSIS — M47816 Spondylosis without myelopathy or radiculopathy, lumbar region: Secondary | ICD-10-CM | POA: Diagnosis not present

## 2021-11-19 DIAGNOSIS — T84023D Instability of internal left knee prosthesis, subsequent encounter: Secondary | ICD-10-CM | POA: Diagnosis not present

## 2021-11-19 DIAGNOSIS — L4050 Arthropathic psoriasis, unspecified: Secondary | ICD-10-CM | POA: Diagnosis not present

## 2021-11-19 DIAGNOSIS — T8484XD Pain due to internal orthopedic prosthetic devices, implants and grafts, subsequent encounter: Secondary | ICD-10-CM | POA: Diagnosis not present

## 2021-11-20 ENCOUNTER — Inpatient Hospital Stay: Payer: Medicare Other | Attending: Oncology

## 2021-11-20 ENCOUNTER — Other Ambulatory Visit: Payer: Self-pay

## 2021-11-20 ENCOUNTER — Inpatient Hospital Stay: Payer: Medicare Other

## 2021-11-20 VITALS — BP 110/65 | HR 66

## 2021-11-20 DIAGNOSIS — N183 Chronic kidney disease, stage 3 unspecified: Secondary | ICD-10-CM

## 2021-11-20 DIAGNOSIS — D631 Anemia in chronic kidney disease: Secondary | ICD-10-CM | POA: Diagnosis not present

## 2021-11-20 DIAGNOSIS — I129 Hypertensive chronic kidney disease with stage 1 through stage 4 chronic kidney disease, or unspecified chronic kidney disease: Secondary | ICD-10-CM | POA: Diagnosis not present

## 2021-11-20 DIAGNOSIS — N1832 Chronic kidney disease, stage 3b: Secondary | ICD-10-CM | POA: Diagnosis not present

## 2021-11-20 DIAGNOSIS — D649 Anemia, unspecified: Secondary | ICD-10-CM

## 2021-11-20 LAB — COMPREHENSIVE METABOLIC PANEL
ALT: 11 U/L (ref 0–44)
AST: 22 U/L (ref 15–41)
Albumin: 3.7 g/dL (ref 3.5–5.0)
Alkaline Phosphatase: 67 U/L (ref 38–126)
Anion gap: 9 (ref 5–15)
BUN: 36 mg/dL — ABNORMAL HIGH (ref 8–23)
CO2: 27 mmol/L (ref 22–32)
Calcium: 10.2 mg/dL (ref 8.9–10.3)
Chloride: 102 mmol/L (ref 98–111)
Creatinine, Ser: 2.62 mg/dL — ABNORMAL HIGH (ref 0.44–1.00)
GFR, Estimated: 19 mL/min — ABNORMAL LOW (ref >60)
Glucose, Bld: 100 mg/dL — ABNORMAL HIGH (ref 70–99)
Potassium: 3.7 mmol/L (ref 3.5–5.1)
Sodium: 138 mmol/L (ref 135–145)
Total Bilirubin: 0.4 mg/dL (ref 0.3–1.2)
Total Protein: 6.8 g/dL (ref 6.5–8.1)

## 2021-11-20 LAB — HEMOGLOBIN AND HEMATOCRIT, BLOOD
HCT: 22.8 % — ABNORMAL LOW (ref 36.0–46.0)
Hemoglobin: 7.4 g/dL — ABNORMAL LOW (ref 12.0–15.0)

## 2021-11-20 MED ORDER — EPOETIN ALFA-EPBX 20000 UNIT/ML IJ SOLN
20000.0000 [IU] | Freq: Once | INTRAMUSCULAR | Status: AC
  Administered 2021-11-20: 20000 [IU] via SUBCUTANEOUS
  Filled 2021-11-20: qty 1

## 2021-11-21 DIAGNOSIS — Z96651 Presence of right artificial knee joint: Secondary | ICD-10-CM | POA: Diagnosis not present

## 2021-11-21 LAB — KAPPA/LAMBDA LIGHT CHAINS
Kappa free light chain: 30.8 mg/L — ABNORMAL HIGH (ref 3.3–19.4)
Kappa, lambda light chain ratio: 1.33 (ref 0.26–1.65)
Lambda free light chains: 23.2 mg/L (ref 5.7–26.3)

## 2021-11-21 NOTE — Progress Notes (Signed)
Carelink Summary Report / Loop Recorder 

## 2021-11-22 ENCOUNTER — Ambulatory Visit: Payer: Medicare Other | Admitting: Family Medicine

## 2021-11-22 DIAGNOSIS — M47816 Spondylosis without myelopathy or radiculopathy, lumbar region: Secondary | ICD-10-CM | POA: Diagnosis not present

## 2021-11-22 DIAGNOSIS — T84023D Instability of internal left knee prosthesis, subsequent encounter: Secondary | ICD-10-CM | POA: Diagnosis not present

## 2021-11-22 DIAGNOSIS — T8484XD Pain due to internal orthopedic prosthetic devices, implants and grafts, subsequent encounter: Secondary | ICD-10-CM | POA: Diagnosis not present

## 2021-11-22 DIAGNOSIS — G894 Chronic pain syndrome: Secondary | ICD-10-CM | POA: Diagnosis not present

## 2021-11-22 DIAGNOSIS — M069 Rheumatoid arthritis, unspecified: Secondary | ICD-10-CM | POA: Diagnosis not present

## 2021-11-22 DIAGNOSIS — L4050 Arthropathic psoriasis, unspecified: Secondary | ICD-10-CM | POA: Diagnosis not present

## 2021-11-25 ENCOUNTER — Ambulatory Visit: Payer: Medicare Other | Admitting: Family Medicine

## 2021-11-25 ENCOUNTER — Encounter: Payer: Self-pay | Admitting: Family Medicine

## 2021-11-25 ENCOUNTER — Ambulatory Visit (INDEPENDENT_AMBULATORY_CARE_PROVIDER_SITE_OTHER): Payer: Medicare Other | Admitting: Family Medicine

## 2021-11-25 VITALS — BP 120/60 | HR 68 | Ht 71.0 in | Wt 254.0 lb

## 2021-11-25 DIAGNOSIS — E782 Mixed hyperlipidemia: Secondary | ICD-10-CM | POA: Diagnosis not present

## 2021-11-25 DIAGNOSIS — E876 Hypokalemia: Secondary | ICD-10-CM

## 2021-11-25 DIAGNOSIS — R739 Hyperglycemia, unspecified: Secondary | ICD-10-CM | POA: Diagnosis not present

## 2021-11-25 DIAGNOSIS — E039 Hypothyroidism, unspecified: Secondary | ICD-10-CM

## 2021-11-25 LAB — MULTIPLE MYELOMA PANEL, SERUM
Albumin SerPl Elph-Mcnc: 3.4 g/dL (ref 2.9–4.4)
Albumin/Glob SerPl: 1.3 (ref 0.7–1.7)
Alpha 1: 0.4 g/dL (ref 0.0–0.4)
Alpha2 Glob SerPl Elph-Mcnc: 0.7 g/dL (ref 0.4–1.0)
B-Globulin SerPl Elph-Mcnc: 1 g/dL (ref 0.7–1.3)
Gamma Glob SerPl Elph-Mcnc: 0.6 g/dL (ref 0.4–1.8)
Globulin, Total: 2.7 g/dL (ref 2.2–3.9)
IgA: 100 mg/dL (ref 64–422)
IgG (Immunoglobin G), Serum: 718 mg/dL (ref 586–1602)
IgM (Immunoglobulin M), Srm: 52 mg/dL (ref 26–217)
Total Protein ELP: 6.1 g/dL (ref 6.0–8.5)

## 2021-11-25 MED ORDER — POTASSIUM CHLORIDE CRYS ER 20 MEQ PO TBCR: 20.0000 meq | EXTENDED_RELEASE_TABLET | Freq: Every day | ORAL | 1 refills | Status: DC

## 2021-11-25 MED ORDER — OMEGA-3-ACID ETHYL ESTERS 1 G PO CAPS: 1.0000 | ORAL_CAPSULE | Freq: Two times a day (BID) | ORAL | 1 refills | Status: DC

## 2021-11-25 MED ORDER — LEVOTHYROXINE SODIUM 175 MCG PO TABS: 175.0000 ug | ORAL_TABLET | Freq: Every day | ORAL | 1 refills | Status: DC

## 2021-11-25 NOTE — Progress Notes (Signed)
Date:  11/25/2021   Name:  Michele Moore   DOB:  1951-01-22   MRN:  [id number]   Chief Complaint: Hypothyroidism, hypokalemia, and Hyperlipidemia  Hyperlipidemia This is a chronic problem. The current episode started more than 1 year ago. The problem is controlled. Recent lipid tests were reviewed and are normal. She has no history of chronic renal disease, diabetes, hypothyroidism, liver disease, obesity or nephrotic syndrome. There are no known factors aggravating her hyperlipidemia. Pertinent negatives include no chest pain, focal sensory loss, focal weakness, leg pain, myalgias or shortness of breath.  Thyroid Problem Presents for follow-up visit. Patient reports no anxiety, cold intolerance, constipation, diarrhea, dry skin, hair loss, heat intolerance, palpitations, weight gain or weight loss. The symptoms have been stable. Her past medical history is significant for hyperlipidemia. There is no history of diabetes.    Lab Results  Component Value Date   NA 138 11/20/2021   K 3.7 11/20/2021   CO2 27 11/20/2021   GLUCOSE 100 (H) 11/20/2021   BUN 36 (H) 11/20/2021   CREATININE 2.62 (H) 11/20/2021   CALCIUM 10.2 11/20/2021   EGFR 21 (L) 09/16/2021   GFRNONAA 19 (L) 11/20/2021   Lab Results  Component Value Date   CHOL 198 03/19/2021   HDL 49 03/19/2021   LDLCALC 121 (H) 03/19/2021   TRIG 159 (H) 03/19/2021   CHOLHDL 4.2 10/01/2018   Lab Results  Component Value Date   TSH 0.057 (L) 09/16/2021   Lab Results  Component Value Date   HGBA1C 5.4 10/01/2018   Lab Results  Component Value Date   WBC 8.7 10/14/2021   HGB 7.4 (L) 11/20/2021   HCT 22.8 (L) 11/20/2021   MCV 91.6 10/14/2021   PLT 180 10/14/2021   Lab Results  Component Value Date   ALT 11 11/20/2021   AST 22 11/20/2021   ALKPHOS 67 11/20/2021   BILITOT 0.4 11/20/2021   No results found for: "25OHVITD2", "25OHVITD3", "VD25OH"   Review of Systems  Constitutional:  Negative for chills, fever, weight  gain and weight loss.  HENT:  Negative for drooling, ear discharge, ear pain and sore throat.   Respiratory:  Negative for cough, shortness of breath and wheezing.   Cardiovascular:  Negative for chest pain, palpitations and leg swelling.  Gastrointestinal:  Negative for abdominal pain, blood in stool, constipation, diarrhea and nausea.  Endocrine: Negative for cold intolerance, heat intolerance and polydipsia.  Genitourinary:  Negative for dysuria, frequency, hematuria and urgency.  Musculoskeletal:  Negative for back pain, myalgias and neck pain.  Skin:  Negative for rash.  Allergic/Immunologic: Negative for environmental allergies.  Neurological:  Negative for dizziness, focal weakness and headaches.  Hematological:  Does not bruise/bleed easily.  Psychiatric/Behavioral:  Negative for suicidal ideas. The patient is not nervous/anxious.     Patient Active Problem List   Diagnosis Date Noted   S/P revision of total knee 10/09/2021   Chronic instability of left knee 02/12/2021   Long term methotrexate user 01/03/2021   Stage 3b chronic kidney disease ([place]) 01/03/2021   Piriformis syndrome of left side 11/15/2020   Normocytic anemia 04/22/2020   Lumbar spondylosis 02/16/2020   Arthritis of left knee 11/24/2019   Left hip pain 08/08/2019   Primary osteoarthritis of left hip 08/08/2019   Non-seasonal allergic rhinitis 04/12/2019   Bursitis of left shoulder 03/24/2019   Benign essential hypertension 03/06/2019   Proteinuria 03/06/2019   Trochanteric bursitis of left hip 10/04/2018   SI joint arthritis (  left) 10/04/2018   Exertional chest pain 09/30/2018   CKD (chronic kidney disease), stage III ([place]) 09/30/2018   Psoriatic arthritis ([place]) 04/23/2018   Trigger ring finger of left hand 04/14/2018   Female pelvic pain 01/20/2018   History of revision of total knee arthroplasty 01/20/2018   Iron deficiency anemia    Heme + stool    Acute gastritis without hemorrhage    Gastric  polyps    Benign neoplasm of ascending colon    Benign neoplasm of transverse colon    Polyp of sigmoid colon    Hypertension 10/01/2017   Hypothyroidism 10/01/2017   Gastroesophageal reflux disease 10/01/2017   Hypokalemia 10/01/2017   Mixed hyperlipidemia 10/01/2017   Reactive airway disease 10/01/2017   Bradycardia 08/24/2017   Severe obesity (BMI 35.0-35.9 with comorbidity) ([place]) 08/18/2017   Chronic gouty arthropathy without tophi 06/25/2017   Encounter for long-term (current) use of high-risk medication 06/25/2017   Positive ANA (antinuclear antibody) 06/17/2017   Cervical facet syndrome 06/16/2017   Primary osteoarthritis of right shoulder 06/16/2017   Sprain of anterior talofibular ligament of right ankle 06/16/2017   Lumbar radiculopathy 04/21/2017   Lumbar degenerative disc disease 04/21/2017   Chronic pain syndrome 04/21/2017   Spinal stenosis, lumbar region, with neurogenic claudication 04/21/2017   SS-A antibody positive 03/05/2017   Chronic knee pain after total replacement of left knee joint 08/03/2015   DOE (dyspnea on exertion) 12/28/2013    Allergies  Allergen Reactions   Ampicillin Swelling   Penicillins Anaphylaxis and Swelling    IgE = 10 (11/05/2017)  Has patient had a PCN reaction causing immediate rash, facial/tongue/throat swelling, SOB or lightheadedness with hypotension: Yes Has patient had a PCN reaction causing severe rash involving mucus membranes or skin necrosis: No Has patient had a PCN reaction that required hospitalization: No Has patient had a PCN reaction occurring within the last 10 years: Yes If all of the above answers are "NO", then may proceed with Cephalosporin use.   Vancomycin Rash    Other reaction(s): Other (see comments)   Vibramycin [Doxycycline Calcium] Rash    Past Surgical History:  Procedure Laterality Date   ANKLE SURGERY Right    CARPAL TUNNEL RELEASE     x3   COLONOSCOPY  2000?   COLONOSCOPY WITH PROPOFOL N/A  10/22/2017   Procedure: COLONOSCOPY WITH PROPOFOL;  Surgeon: Lucilla Lame, MD;  Location: [place];  Service: Endoscopy;  Laterality: N/A;   ESOPHAGOGASTRODUODENOSCOPY (EGD) WITH PROPOFOL N/A 10/22/2017   Procedure: ESOPHAGOGASTRODUODENOSCOPY (EGD) WITH PROPOFOL;  Surgeon: Lucilla Lame, MD;  Location: [place];  Service: Endoscopy;  Laterality: N/A;   FUSION OF TALONAVICULAR JOINT Right 08/03/2015   Procedure: TAILOR NAVICULAR JOINT FUSION - RIGHT ;  Surgeon: [name], DPM;  Location: [facility];  Service: Podiatry;  Laterality: Right;   JOINT REPLACEMENT Right    knee x 3 ,right x1 and left x2   LOOP RECORDER IMPLANT  08/22/2021   Procedure: LOOP RECORDER IMPLANT; Location: [place]; Surgeon: Virl Axe, MD   POLYPECTOMY N/A 10/22/2017   Procedure: POLYPECTOMY;  Surgeon: Lucilla Lame, MD;  Location: [place];  Service: Endoscopy;  Laterality: N/A;   ROTATOR CUFF REPAIR Right 1995   TONSILLECTOMY     TOTAL KNEE REVISION Right 01/20/2018   Procedure: POLYETHYLENE EXCHANGE;  Surgeon: Dereck Leep, MD;  Location: [facility];  Service: Orthopedics;  Laterality: Right;   TOTAL KNEE REVISION Left 10/09/2021   Procedure: LEFT TOTAL KNEE REVISION WITH POLYETHYLENE EXCHANGE.;  Surgeon: Dereck Leep, MD;  Location: [facility];  Service: Orthopedics;  Laterality: Left;   TUBAL LIGATION     UPPER GI ENDOSCOPY  2000?    Social History   Tobacco Use   Smoking status: Former    Packs/day: 2.00    Years: 20.00    Total pack years: 40.00    Types: Cigarettes    Quit date: 02/24/1993    Years since quitting: 28.7   Smokeless tobacco: Never  Vaping Use   Vaping Use: Never used  Substance Use Topics   Alcohol use: No    Alcohol/week: 0.0 standard drinks of alcohol   Drug use: No     Medication list has been reviewed and updated.  Current Meds  Medication Sig   acetaminophen (TYLENOL) 500 MG tablet Take 500 mg by mouth every 6 (six) hours as needed.    acetaminophen (TYLENOL) 650 MG CR tablet Take 650 mg by mouth every 8 (eight) hours as needed for pain.   albuterol (VENTOLIN HFA) 108 (90 Base) MCG/ACT inhaler Inhale 2 puffs into the lungs every 6 (six) hours as needed for wheezing or shortness of breath.   allopurinol (ZYLOPRIM) 100 MG tablet Take 200 mg by mouth every morning.    betamethasone dipropionate 0.05 % cream APPLY TO PSORIASIS ON HANDS TWICE DAILY ONLY. AVOID FACE, GROIN AND AXILLA   betamethasone valerate (VALISONE) 0.1 % cream Apply topically 2 (two) times daily as needed (Rash).   calcipotriene (DOVONOX) 0.005 % cream APPLY TOPICALLY TO PSORIASIS AREAS ON LEGS AND FEET ONCE OR TWICE DAILY   Cholecalciferol (VITAMIN D3) 2000 units TABS Take 2,000 Units by mouth daily.   clobetasol ointment (TEMOVATE) 0.05 % Apply topically 2 (two) times daily.   diclofenac sodium (VOLTAREN) 1 % GEL Apply 2 g topically 3 (three) times daily.    FARXIGA 10 MG TABS tablet Take 10 mg by mouth every morning.   furosemide (LASIX) 40 MG tablet Take 40 mg by mouth daily.   gabapentin (NEURONTIN) 600 MG tablet Take 1 tablet (600 mg total) by mouth at bedtime.   hydrALAZINE (APRESOLINE) 50 MG tablet Take 1 tablet by mouth twice daily   HYDROcodone-acetaminophen (NORCO) 7.5-325 MG tablet Take 1 tablet by mouth every 8 (eight) hours as needed for severe pain. Must last 30 days.   [START ON 12/23/2021] HYDROcodone-acetaminophen (NORCO) 7.5-325 MG tablet Take 1 tablet by mouth every 8 (eight) hours as needed for severe pain. Must last 30 days.   ketoconazole (NIZORAL) 2 % cream Apply topically daily as needed.   levothyroxine (SYNTHROID) 175 MCG tablet Take 1 tablet (175 mcg total) by mouth daily.   meclizine (ANTIVERT) 12.5 MG tablet TAKE 1 TABLET(12.5 MG) BY MOUTH THREE TIMES DAILY AS NEEDED FOR DIZZINESS   methotrexate (RHEUMATREX) 2.5 MG tablet Take 7.5 mg by mouth every Friday. Take 3 tablets (2.39m x3) by mouth once weekly   metoprolol tartrate  (LOPRESSOR) 25 MG tablet Take 1 tablet (25 mg total) by mouth 2 (two) times daily.   montelukast (SINGULAIR) 10 MG tablet TAKE 1 TABLET(10 MG) BY MOUTH AT BEDTIME   Multiple Vitamins-Calcium (ONE-A-DAY WOMENS FORMULA PO) Take 1 tablet by mouth daily.   mupirocin ointment (BACTROBAN) 2 % Apply 1 application topically 2 (two) times daily.   nystatin cream (MYCOSTATIN) Apply 1 Application topically 2 (two) times daily.   omega-3 acid ethyl esters (LOVAZA) 1 g capsule Take 1 capsule (1 g total) by mouth 2 (two) times daily.   omeprazole (  PRILOSEC) 40 MG capsule TAKE 1 CAPSULE(40 MG) BY MOUTH DAILY   OTEZLA 30 MG TABS Take 1 tablet by mouth daily.   Polyethyl Glycol-Propyl Glycol (SYSTANE OP) Place 1 drop into both eyes daily as needed (dry eyes).   potassium chloride SA (KLOR-CON M) 20 MEQ tablet Take 0.5 tablets (10 mEq total) by mouth daily.   tiZANidine (ZANAFLEX) 4 MG tablet Take 1 tablet (4 mg total) by mouth at bedtime.   traMADol (ULTRAM) 50 MG tablet Take 1-2 tablets (50-100 mg total) by mouth every 4 (four) hours as needed for moderate pain.   triamcinolone (KENALOG) 0.1 % Apply 1 application topically 2 (two) times daily.   triamterene-hydrochlorothiazide (MAXZIDE) 75-50 MG tablet Take 1 tablet by mouth daily.       11/25/2021    2:52 PM 09/16/2021    1:43 PM 06/12/2020   10:20 AM 12/08/2019   11:17 AM  GAD 7 : Generalized Anxiety Score  Nervous, Anxious, on Edge 0 0 0 0  Control/stop worrying 0 0 0 0  Worry too much - different things 0 0 0 0  Trouble relaxing 0 3 0 0  Restless 0 0 0 0  Easily annoyed or irritable 0 0 0 0  Afraid - awful might happen 0 0 0 0  Total GAD 7 Score 0 3 0 0  Anxiety Difficulty  Not difficult at all         11/25/2021    2:52 PM 09/16/2021    1:42 PM 05/14/2021   11:31 AM  Depression screen PHQ 2/9  Decreased Interest 0 0 0  Down, Depressed, Hopeless 0 0 0  PHQ - 2 Score 0 0 0  Altered sleeping 0 3   Tired, decreased energy 0 3   Change in  appetite 0 0   Feeling bad or failure about yourself  0 0   Trouble concentrating 0 0   Moving slowly or fidgety/restless 0 0   Suicidal thoughts 0 0   PHQ-9 Score 0 6   Difficult doing work/chores Not difficult at all Extremely dIfficult     BP Readings from Last 3 Encounters:  11/25/21 120/60  11/20/21 110/65  10/24/21 116/72    Physical Exam Vitals and nursing note reviewed.  Constitutional:      Appearance: She is well-developed.  HENT:     Head: Normocephalic.     Right Ear: Tympanic membrane and external ear normal.     Left Ear: Tympanic membrane and external ear normal.     Nose: Nose normal.  Eyes:     General: Lids are everted, no foreign bodies appreciated. No scleral icterus.       Left eye: No foreign body or hordeolum.     Conjunctiva/sclera: Conjunctivae normal.     Right eye: Right conjunctiva is not injected.     Left eye: Left conjunctiva is not injected.     Pupils: Pupils are equal, round, and reactive to light.  Neck:     Thyroid: No thyromegaly.     Vascular: No JVD.     Trachea: No tracheal deviation.  Cardiovascular:     Rate and Rhythm: Normal rate and regular rhythm.     Heart sounds: Normal heart sounds. No murmur heard.    No friction rub. No gallop.  Pulmonary:     Effort: Pulmonary effort is normal. No respiratory distress.     Breath sounds: Normal breath sounds. No wheezing, rhonchi or rales.  Abdominal:  General: Bowel sounds are normal.     Palpations: Abdomen is soft. There is no mass.     Tenderness: There is no abdominal tenderness. There is no guarding or rebound.  Musculoskeletal:        General: No tenderness. Normal range of motion.     Cervical back: Normal range of motion and neck supple.  Lymphadenopathy:     Cervical: No cervical adenopathy.  Skin:    General: Skin is warm.     Findings: No rash.  Neurological:     Mental Status: She is alert and oriented to person, place, and time.     Cranial Nerves: No cranial  nerve deficit.     Deep Tendon Reflexes: Reflexes normal.  Psychiatric:        Mood and Affect: Mood is not anxious or depressed.     Wt Readings from Last 3 Encounters:  11/25/21 254 lb (115.2 kg)  10/09/21 260 lb (117.9 kg)  09/16/21 260 lb (117.9 kg)    BP 120/60   Pulse 68   Ht '5\' 11"'  (1.803 m)   Wt 254 lb (115.2 kg)   BMI 35.43 kg/m   Assessment and Plan: 1. Mixed hyperlipidemia Chronic.  Controlled.  Continue Lovaza 1 g twice a day. - omega-3 acid ethyl esters (LOVAZA) 1 g capsule; Take 1 capsule (1 g total) by mouth 2 (two) times daily.  Dispense: 180 capsule; Refill: 1  2. Hypokalemia Chronic.  Controlled.  Supplement with potassium 20 mill clearance full tablet once a day. - potassium chloride SA (KLOR-CON M) 20 MEQ tablet; Take 1 tablet (20 mEq total) by mouth daily.  Dispense: 90 tablet; Refill: 1  3. Hypothyroidism, unspecified type Chronic.  Controlled.  Stable.  Pending TSH we will adjust levothyroxine 175 mcg accordingly. - levothyroxine (SYNTHROID) 175 MCG tablet; Take 1 tablet (175 mcg total) by mouth daily.  Dispense: 90 tablet; Refill: 1 - TSH  4. Hyperglycemia Previous episodes of hyperglycemia for which uncertain whether this was a dietary or timing of a nonfasting circumstance we will check A1c for the possibility of prediabetes/diabetes. - Hemoglobin A1c     Otilio Miu, MD

## 2021-11-26 LAB — HEMOGLOBIN A1C
Est. average glucose Bld gHb Est-mCnc: 105 mg/dL
Hgb A1c MFr Bld: 5.3 % (ref 4.8–5.6)

## 2021-11-26 LAB — TSH: TSH: 0.113 u[IU]/mL — ABNORMAL LOW (ref 0.450–4.500)

## 2021-11-27 ENCOUNTER — Ambulatory Visit: Payer: Self-pay | Admitting: *Deleted

## 2021-11-27 ENCOUNTER — Other Ambulatory Visit: Payer: Self-pay

## 2021-11-27 ENCOUNTER — Telehealth: Payer: Self-pay

## 2021-11-27 DIAGNOSIS — M069 Rheumatoid arthritis, unspecified: Secondary | ICD-10-CM | POA: Diagnosis not present

## 2021-11-27 DIAGNOSIS — L4050 Arthropathic psoriasis, unspecified: Secondary | ICD-10-CM | POA: Diagnosis not present

## 2021-11-27 DIAGNOSIS — G894 Chronic pain syndrome: Secondary | ICD-10-CM | POA: Diagnosis not present

## 2021-11-27 DIAGNOSIS — M47816 Spondylosis without myelopathy or radiculopathy, lumbar region: Secondary | ICD-10-CM | POA: Diagnosis not present

## 2021-11-27 DIAGNOSIS — E039 Hypothyroidism, unspecified: Secondary | ICD-10-CM

## 2021-11-27 DIAGNOSIS — T84023D Instability of internal left knee prosthesis, subsequent encounter: Secondary | ICD-10-CM | POA: Diagnosis not present

## 2021-11-27 DIAGNOSIS — T8484XD Pain due to internal orthopedic prosthetic devices, implants and grafts, subsequent encounter: Secondary | ICD-10-CM | POA: Diagnosis not present

## 2021-11-27 MED ORDER — LEVOTHYROXINE SODIUM 150 MCG PO TABS: 150.0000 ug | ORAL_TABLET | Freq: Every day | ORAL | 0 refills | Status: DC

## 2021-11-27 NOTE — Telephone Encounter (Signed)
Called pt again and left another message. This time stating that ins should pay since there has been a change in the dose of the medicine. If she can pick up the 150s today, take those. If not, take the 175 today and let me know

## 2021-11-27 NOTE — Telephone Encounter (Signed)
Reason for Disposition  [1] Caller has URGENT medicine question about med that PCP or specialist prescribed AND [2] triager unable to answer question  Answer Assessment - Initial Assessment Questions 1. NAME of MEDICINE: "What medicine(s) are you calling about?"     Synthroid 2. QUESTION: "What is your question?" (e.g., double dose of medicine, side effect)     I was on 150 mcg.   She changed me to 175 mcg.  I went yesterday and got a 3 month supply.   Now she is saying I need to take 169mg.   My insurance Medicaid is not going to pay for this.   Dr. JRonnald Rampneeds to call my insurance co.  3. PRESCRIBER: "Who prescribed the medicine?" Reason: if prescribed by specialist, call should be referred to that group.     Dr. DOtilio Miu 4. SYMPTOMS: "Do you have any symptoms?" If Yes, ask: "What symptoms are you having?"  "How bad are the symptoms (e.g., mild, moderate, severe)     N/A She wants to know should she take the 175 mcg tablet today? 5. PREGNANCY:  "Is there any chance that you are pregnant?" "When was your last menstrual period?"     N/A  Protocols used: Medication Question Call-A-AH  Chief Complaint: Pt called in and was given the message from Dr. DOtilio Miudated 11/27/2021 at 7:48 AM.  The Synthroid dose was changed yesterday.   She went and got a 3 month supply yesterday.  Now Dr. JRonnald Rampis changing the dose and pt said her insurance company Medicaid is not going to pay for another 3 month prescription.   She's requesting Dr. JRonnald Rampcall her insurance co.  Also asking if Dr. JRonnald Rampwants her to take the 175 mcg dose today?  Symptoms: N/A Frequency: N/A Pertinent Negatives: Patient denies N/A Disposition: '[]'$ ED /'[]'$ Urgent Care (no appt availability in office) / '[]'$ Appointment(In office/virtual)/ '[]'$  [facility]/ '[]'$ Home Care/ '[]'$ Refused Recommended Disposition /'[]'$ [facility]/ '[x]'$  Follow-up with PCP Additional Notes: High priority message sent to Dr. DOtilio Miuwith  pt's concern.   Pt agreeable to someone calling her back.

## 2021-11-27 NOTE — Telephone Encounter (Signed)
Patient returned call and notified of message. She understands and will contact office if she is unable to get updated dosing.

## 2021-11-28 DIAGNOSIS — M47816 Spondylosis without myelopathy or radiculopathy, lumbar region: Secondary | ICD-10-CM | POA: Diagnosis not present

## 2021-11-28 DIAGNOSIS — M069 Rheumatoid arthritis, unspecified: Secondary | ICD-10-CM | POA: Diagnosis not present

## 2021-11-28 DIAGNOSIS — G894 Chronic pain syndrome: Secondary | ICD-10-CM | POA: Diagnosis not present

## 2021-11-28 DIAGNOSIS — T8484XD Pain due to internal orthopedic prosthetic devices, implants and grafts, subsequent encounter: Secondary | ICD-10-CM | POA: Diagnosis not present

## 2021-11-28 DIAGNOSIS — L4050 Arthropathic psoriasis, unspecified: Secondary | ICD-10-CM | POA: Diagnosis not present

## 2021-11-28 DIAGNOSIS — T84023D Instability of internal left knee prosthesis, subsequent encounter: Secondary | ICD-10-CM | POA: Diagnosis not present

## 2021-12-02 ENCOUNTER — Ambulatory Visit (INDEPENDENT_AMBULATORY_CARE_PROVIDER_SITE_OTHER): Payer: Medicare Other

## 2021-12-02 DIAGNOSIS — R002 Palpitations: Secondary | ICD-10-CM

## 2021-12-03 ENCOUNTER — Telehealth: Payer: Self-pay

## 2021-12-03 LAB — CUP PACEART REMOTE DEVICE CHECK
Date Time Interrogation Session: 20231008173803
Implantable Pulse Generator Implant Date: 20230629

## 2021-12-03 NOTE — Telephone Encounter (Signed)
Reviewed transmission alert sent by [employer]:  MDT ILR alert for AF. EGM shows AF/RVR, 6 min on 11/30/21 with rate avg 190's. No [place] recorded on record. Routing for further review- JJB   Reviewed report with Dr. Caryl Comes.  Made aware of 6 minutes of AFIB with RVR, a burden 0.5%.  Informed patient having occasional periods in and out of Afib.  Referenced 10/29/21 remote transmission note where Dr. Caryl Comes reports afib with+ [place], however, patient is currently not on an [place].   Dr. Caryl Comes reviews and considers patient exhibiting SCAF and will defer starting any [place] at present.  Will continue to monitor.

## 2021-12-04 DIAGNOSIS — T84023D Instability of internal left knee prosthesis, subsequent encounter: Secondary | ICD-10-CM | POA: Diagnosis not present

## 2021-12-04 DIAGNOSIS — G894 Chronic pain syndrome: Secondary | ICD-10-CM | POA: Diagnosis not present

## 2021-12-04 DIAGNOSIS — M47816 Spondylosis without myelopathy or radiculopathy, lumbar region: Secondary | ICD-10-CM | POA: Diagnosis not present

## 2021-12-04 DIAGNOSIS — L4050 Arthropathic psoriasis, unspecified: Secondary | ICD-10-CM | POA: Diagnosis not present

## 2021-12-04 DIAGNOSIS — M069 Rheumatoid arthritis, unspecified: Secondary | ICD-10-CM | POA: Diagnosis not present

## 2021-12-04 DIAGNOSIS — T8484XD Pain due to internal orthopedic prosthetic devices, implants and grafts, subsequent encounter: Secondary | ICD-10-CM | POA: Diagnosis not present

## 2021-12-06 DIAGNOSIS — M47816 Spondylosis without myelopathy or radiculopathy, lumbar region: Secondary | ICD-10-CM | POA: Diagnosis not present

## 2021-12-06 DIAGNOSIS — L4050 Arthropathic psoriasis, unspecified: Secondary | ICD-10-CM | POA: Diagnosis not present

## 2021-12-06 DIAGNOSIS — T8484XD Pain due to internal orthopedic prosthetic devices, implants and grafts, subsequent encounter: Secondary | ICD-10-CM | POA: Diagnosis not present

## 2021-12-06 DIAGNOSIS — G894 Chronic pain syndrome: Secondary | ICD-10-CM | POA: Diagnosis not present

## 2021-12-06 DIAGNOSIS — T84023D Instability of internal left knee prosthesis, subsequent encounter: Secondary | ICD-10-CM | POA: Diagnosis not present

## 2021-12-06 DIAGNOSIS — M069 Rheumatoid arthritis, unspecified: Secondary | ICD-10-CM | POA: Diagnosis not present

## 2021-12-10 DIAGNOSIS — Z9181 History of falling: Secondary | ICD-10-CM | POA: Diagnosis not present

## 2021-12-10 DIAGNOSIS — I130 Hypertensive heart and chronic kidney disease with heart failure and stage 1 through stage 4 chronic kidney disease, or unspecified chronic kidney disease: Secondary | ICD-10-CM | POA: Diagnosis not present

## 2021-12-10 DIAGNOSIS — I509 Heart failure, unspecified: Secondary | ICD-10-CM | POA: Diagnosis not present

## 2021-12-10 DIAGNOSIS — E1122 Type 2 diabetes mellitus with diabetic chronic kidney disease: Secondary | ICD-10-CM | POA: Diagnosis not present

## 2021-12-10 DIAGNOSIS — K259 Gastric ulcer, unspecified as acute or chronic, without hemorrhage or perforation: Secondary | ICD-10-CM | POA: Diagnosis not present

## 2021-12-10 DIAGNOSIS — M1030 Gout due to renal impairment, unspecified site: Secondary | ICD-10-CM | POA: Diagnosis not present

## 2021-12-10 DIAGNOSIS — T8484XD Pain due to internal orthopedic prosthetic devices, implants and grafts, subsequent encounter: Secondary | ICD-10-CM | POA: Diagnosis not present

## 2021-12-10 DIAGNOSIS — J45909 Unspecified asthma, uncomplicated: Secondary | ICD-10-CM | POA: Diagnosis not present

## 2021-12-10 DIAGNOSIS — L4050 Arthropathic psoriasis, unspecified: Secondary | ICD-10-CM | POA: Diagnosis not present

## 2021-12-10 DIAGNOSIS — Z79891 Long term (current) use of opiate analgesic: Secondary | ICD-10-CM | POA: Diagnosis not present

## 2021-12-10 DIAGNOSIS — G894 Chronic pain syndrome: Secondary | ICD-10-CM | POA: Diagnosis not present

## 2021-12-10 DIAGNOSIS — H00016 Hordeolum externum left eye, unspecified eyelid: Secondary | ICD-10-CM | POA: Diagnosis not present

## 2021-12-10 DIAGNOSIS — K219 Gastro-esophageal reflux disease without esophagitis: Secondary | ICD-10-CM | POA: Diagnosis not present

## 2021-12-10 DIAGNOSIS — E876 Hypokalemia: Secondary | ICD-10-CM | POA: Diagnosis not present

## 2021-12-10 DIAGNOSIS — M47816 Spondylosis without myelopathy or radiculopathy, lumbar region: Secondary | ICD-10-CM | POA: Diagnosis not present

## 2021-12-10 DIAGNOSIS — T84023D Instability of internal left knee prosthesis, subsequent encounter: Secondary | ICD-10-CM | POA: Diagnosis not present

## 2021-12-10 DIAGNOSIS — Z79631 Long term (current) use of antimetabolite agent: Secondary | ICD-10-CM | POA: Diagnosis not present

## 2021-12-10 DIAGNOSIS — Z7984 Long term (current) use of oral hypoglycemic drugs: Secondary | ICD-10-CM | POA: Diagnosis not present

## 2021-12-10 DIAGNOSIS — E782 Mixed hyperlipidemia: Secondary | ICD-10-CM | POA: Diagnosis not present

## 2021-12-10 DIAGNOSIS — M069 Rheumatoid arthritis, unspecified: Secondary | ICD-10-CM | POA: Diagnosis not present

## 2021-12-10 DIAGNOSIS — D631 Anemia in chronic kidney disease: Secondary | ICD-10-CM | POA: Diagnosis not present

## 2021-12-10 DIAGNOSIS — N1832 Chronic kidney disease, stage 3b: Secondary | ICD-10-CM | POA: Diagnosis not present

## 2021-12-10 DIAGNOSIS — Z6835 Body mass index (BMI) 35.0-35.9, adult: Secondary | ICD-10-CM | POA: Diagnosis not present

## 2021-12-10 DIAGNOSIS — E039 Hypothyroidism, unspecified: Secondary | ICD-10-CM | POA: Diagnosis not present

## 2021-12-11 NOTE — Progress Notes (Signed)
Carelink Summary Report / Loop Recorder 

## 2021-12-12 DIAGNOSIS — M47816 Spondylosis without myelopathy or radiculopathy, lumbar region: Secondary | ICD-10-CM | POA: Diagnosis not present

## 2021-12-12 DIAGNOSIS — L4050 Arthropathic psoriasis, unspecified: Secondary | ICD-10-CM | POA: Diagnosis not present

## 2021-12-12 DIAGNOSIS — T84023D Instability of internal left knee prosthesis, subsequent encounter: Secondary | ICD-10-CM | POA: Diagnosis not present

## 2021-12-12 DIAGNOSIS — T8484XD Pain due to internal orthopedic prosthetic devices, implants and grafts, subsequent encounter: Secondary | ICD-10-CM | POA: Diagnosis not present

## 2021-12-12 DIAGNOSIS — G894 Chronic pain syndrome: Secondary | ICD-10-CM | POA: Diagnosis not present

## 2021-12-12 DIAGNOSIS — M069 Rheumatoid arthritis, unspecified: Secondary | ICD-10-CM | POA: Diagnosis not present

## 2021-12-17 DIAGNOSIS — L4050 Arthropathic psoriasis, unspecified: Secondary | ICD-10-CM | POA: Diagnosis not present

## 2021-12-17 DIAGNOSIS — R768 Other specified abnormal immunological findings in serum: Secondary | ICD-10-CM | POA: Diagnosis not present

## 2021-12-17 DIAGNOSIS — Z79899 Other long term (current) drug therapy: Secondary | ICD-10-CM | POA: Diagnosis not present

## 2021-12-17 DIAGNOSIS — L409 Psoriasis, unspecified: Secondary | ICD-10-CM | POA: Diagnosis not present

## 2021-12-17 MED FILL — Iron Sucrose Inj 20 MG/ML (Fe Equiv): INTRAVENOUS | Qty: 10 | Status: AC

## 2021-12-18 ENCOUNTER — Other Ambulatory Visit: Payer: Self-pay

## 2021-12-18 ENCOUNTER — Inpatient Hospital Stay: Payer: Medicare Other

## 2021-12-18 ENCOUNTER — Inpatient Hospital Stay (HOSPITAL_BASED_OUTPATIENT_CLINIC_OR_DEPARTMENT_OTHER): Payer: Medicare Other | Admitting: Oncology

## 2021-12-18 ENCOUNTER — Encounter: Payer: Self-pay | Admitting: Oncology

## 2021-12-18 ENCOUNTER — Other Ambulatory Visit: Payer: Medicare Other

## 2021-12-18 ENCOUNTER — Ambulatory Visit: Payer: Medicare Other | Admitting: Oncology

## 2021-12-18 ENCOUNTER — Inpatient Hospital Stay: Payer: Medicare Other | Attending: Oncology

## 2021-12-18 ENCOUNTER — Ambulatory Visit: Payer: Medicare Other

## 2021-12-18 ENCOUNTER — Emergency Department
Admission: EM | Admit: 2021-12-18 | Discharge: 2021-12-19 | Disposition: A | Discharge status: Home / Self Care | Payer: Medicare Other | Attending: Emergency Medicine | Admitting: Emergency Medicine

## 2021-12-18 VITALS — BP 116/53 | HR 82 | Temp 98.4°F | Wt 254.1 lb

## 2021-12-18 DIAGNOSIS — Z79899 Other long term (current) drug therapy: Secondary | ICD-10-CM | POA: Insufficient documentation

## 2021-12-18 DIAGNOSIS — D631 Anemia in chronic kidney disease: Secondary | ICD-10-CM

## 2021-12-18 DIAGNOSIS — M069 Rheumatoid arthritis, unspecified: Secondary | ICD-10-CM | POA: Insufficient documentation

## 2021-12-18 DIAGNOSIS — D649 Anemia, unspecified: Secondary | ICD-10-CM

## 2021-12-18 DIAGNOSIS — I129 Hypertensive chronic kidney disease with stage 1 through stage 4 chronic kidney disease, or unspecified chronic kidney disease: Secondary | ICD-10-CM | POA: Insufficient documentation

## 2021-12-18 DIAGNOSIS — N183 Chronic kidney disease, stage 3 unspecified: Secondary | ICD-10-CM

## 2021-12-18 DIAGNOSIS — N189 Chronic kidney disease, unspecified: Secondary | ICD-10-CM | POA: Diagnosis not present

## 2021-12-18 DIAGNOSIS — N184 Chronic kidney disease, stage 4 (severe): Secondary | ICD-10-CM | POA: Insufficient documentation

## 2021-12-18 DIAGNOSIS — N1832 Chronic kidney disease, stage 3b: Secondary | ICD-10-CM | POA: Insufficient documentation

## 2021-12-18 LAB — CBC WITH DIFFERENTIAL/PLATELET
Abs Immature Granulocytes: 0.03 10*3/uL (ref 0.00–0.07)
Abs Immature Granulocytes: 0.03 10*3/uL (ref 0.00–0.07)
Basophils Absolute: 0 10*3/uL (ref 0.0–0.1)
Basophils Absolute: 0 10*3/uL (ref 0.0–0.1)
Basophils Relative: 1 %
Basophils Relative: 1 %
Eosinophils Absolute: 0.1 10*3/uL (ref 0.0–0.5)
Eosinophils Absolute: 0.1 10*3/uL (ref 0.0–0.5)
Eosinophils Relative: 2 %
Eosinophils Relative: 2 %
HCT: 19.1 % — ABNORMAL LOW (ref 36.0–46.0)
HCT: 20 % — ABNORMAL LOW (ref 36.0–46.0)
Hemoglobin: 5.8 g/dL — ABNORMAL LOW (ref 12.0–15.0)
Hemoglobin: 6 g/dL — ABNORMAL LOW (ref 12.0–15.0)
Immature Granulocytes: 0 %
Immature Granulocytes: 0 %
Lymphocytes Relative: 16 %
Lymphocytes Relative: 18 %
Lymphs Abs: 1.2 10*3/uL (ref 0.7–4.0)
Lymphs Abs: 1.2 10*3/uL (ref 0.7–4.0)
MCH: 28 pg (ref 26.0–34.0)
MCH: 27.4 pg (ref 26.0–34.0)
MCHC: 30.4 g/dL (ref 30.0–36.0)
MCHC: 30 g/dL (ref 30.0–36.0)
MCV: 92.3 fL (ref 80.0–100.0)
MCV: 91.3 fL (ref 80.0–100.0)
Monocytes Absolute: 0.4 10*3/uL (ref 0.1–1.0)
Monocytes Absolute: 0.5 10*3/uL (ref 0.1–1.0)
Monocytes Relative: 6 %
Monocytes Relative: 7 %
Neutro Abs: 5.5 10*3/uL (ref 1.7–7.7)
Neutro Abs: 4.9 10*3/uL (ref 1.7–7.7)
Neutrophils Relative %: 75 %
Neutrophils Relative %: 72 %
Platelets: 234 10*3/uL (ref 150–400)
Platelets: 271 10*3/uL (ref 150–400)
RBC: 2.07 MIL/uL — ABNORMAL LOW (ref 3.87–5.11)
RBC: 2.19 MIL/uL — ABNORMAL LOW (ref 3.87–5.11)
RDW: 18.5 % — ABNORMAL HIGH (ref 11.5–15.5)
RDW: 18.6 % — ABNORMAL HIGH (ref 11.5–15.5)
WBC: 7.3 10*3/uL (ref 4.0–10.5)
WBC: 6.7 10*3/uL (ref 4.0–10.5)
nRBC: 0 % (ref 0.0–0.2)
nRBC: 0.3 % — ABNORMAL HIGH (ref 0.0–0.2)

## 2021-12-18 LAB — BASIC METABOLIC PANEL
Anion gap: 8 (ref 5–15)
BUN: 31 mg/dL — ABNORMAL HIGH (ref 8–23)
CO2: 25 mmol/L (ref 22–32)
Calcium: 10.1 mg/dL (ref 8.9–10.3)
Chloride: 105 mmol/L (ref 98–111)
Creatinine, Ser: 2.28 mg/dL — ABNORMAL HIGH (ref 0.44–1.00)
GFR, Estimated: 22 mL/min — ABNORMAL LOW (ref >60)
Glucose, Bld: 110 mg/dL — ABNORMAL HIGH (ref 70–99)
Potassium: 4 mmol/L (ref 3.5–5.1)
Sodium: 138 mmol/L (ref 135–145)

## 2021-12-18 LAB — FERRITIN: Ferritin: 17 ng/mL (ref 11–307)

## 2021-12-18 LAB — IRON AND TIBC
Iron: 20 ug/dL — ABNORMAL LOW (ref 28–170)
Saturation Ratios: 7 % — ABNORMAL LOW (ref 10.4–31.8)
TIBC: 307 ug/dL (ref 250–450)
UIBC: 287 ug/dL

## 2021-12-18 MED ORDER — SODIUM CHLORIDE 0.9 % IV SOLN: 10.0000 mL/h | Freq: Once | INTRAVENOUS | Status: DC

## 2021-12-18 MED ORDER — HYDROCODONE-ACETAMINOPHEN 7.5-325 MG PO TABS
1.0000 | ORAL_TABLET | Freq: Four times a day (QID) | ORAL | Status: DC | PRN
  Administered 2021-12-18: 1 via ORAL
  Filled 2021-12-18: qty 1

## 2021-12-18 MED ORDER — EPOETIN ALFA-EPBX 20000 UNIT/ML IJ SOLN
20000.0000 [IU] | Freq: Once | INTRAMUSCULAR | Status: AC
  Administered 2021-12-18: 20000 [IU] via SUBCUTANEOUS
  Filled 2021-12-18: qty 1

## 2021-12-18 NOTE — Progress Notes (Signed)
Hematology/Oncology Progress note [phone] [phone]         Clinic Day:  12/18/2021  ASSESSMENT & PLAN:   Symptomatic anemia Labs reviewed and discussed with patient. Acute drop of hemoglobin to 5.8. Recommend patient to go to emergency room for PRBC transfusion.  Recommend 2 units.  I called ER and spoke to triage nurse  Anemia due to stage 3b chronic kidney disease ([place]) Acute drop of hemoglobin is likely secondary to blood loss from the replacement surgery. Iron panel results came back after she left our clinic.  Recommend her to empirically start oral iron supplementation. Consistent with iron deficiency anemia.  Recommend patient to follow-up with our clinic next week for IV Venofer treatments. Proceed with Retacrit 20000 units x 1 today.  Rheumatoid arthritis (HCC) #Rheumatoid arthritis on methotrexate.  Continue folic acid 3 days/week.    No orders of the defined types were placed in this encounter.  Follow-up appointment Patient will go to emergency room for blood transfusion. Recommend IV Venofer twice weekly x4 Follow-up in 4 weeks lab MD Retacrit  All questions were answered. The patient knows to call the clinic with any problems, questions or concerns.  Earlie Server, MD, PhD [facility] Hematology Oncology 12/18/2021    Chief Complaint: Michele Moore is a 71 y.o. female presents for anemia in stage IIIb chronic kidney disease   [name] is a 71 y.o.afemale who has above oncology history reviewed by me today presented for follow up visit for management of anemia in CKD  PERTINENT HEMATOLOGY HISTORY Patient previously followed up by Dr.Corcoran, patient switched care to me on 08/30/20 Extensive medical record review was performed by me  # anemia in stage IIIb chronic kidney disease.  Work-up on 04/23/2020 revealed a hematocrit of 35.0, hemoglobin 11.4, MCV 92.3, platelets 168,000, WBC 4,600 with an ANC of  3000. Ferritin was 107 with an iron saturation of 37% and a TIBC of 315. Sed rate was 54 and CRP was 0.9.  Vitamin B12 was 429 and folate >100.0.   10/22/2017 Colonoscopy for heme positive stool revealed one 7 mm polyp in the ascending colon, one 3 mm polyp in the transverse colon, and three 1 to 4 mm polyps in the sigmoid colon. There were non-bleeding internal hemorrhoids. There were petechia(e) in the rectum. Pathology showed two tubular adenomas and one hyperplastic polyp. EGD on 10/22/2017 showed gastritis and one gastric polyp.  She has Sjogren's syndrome, rheumatoid arthritis, hypertension, and proteinuria.  She takes oral iron once a day.  Patient follows up with pain clinic for pain management.  She previously tolerated IV Venofer treatments.  INTERVAL HISTORY Michele Moore is a 71 y.o. female who has above history reviewed by me today presents for follow up visit for anemia secondary to chronic kidney disease.  Patient has been on Retacrit injections monthly.  Recently she underwent left knee replacement.  She has felt very fatigued lately.  No dizziness, lightheadedness, syncope chest pain. denies any blood in the stool.  Past Medical History:  Diagnosis Date   (HFpEF) heart failure with preserved ejection fraction ([place]) 01/13/2014   a.) TTE 01/13/2014: EF 45%, apical HK, mod LVH, mild MAC, mild LAE/RVE, mod MR/TR/PR; b.) TTE 10/01/2018: EF 55-60%, mild MVH, mild LAE, triv MR/TR, G1DD   Allergy    ANA positive    Anemia of chronic renal failure, stage 3 (moderate), unspecified whether stage 3a or 3b CKD ([place])    Aortic atherosclerosis ([place])  Asthma    Cataract    Chronic pain syndrome    a.) followed by [facility] Holley Raring, MD)   Chronic, continuous use of opioids    CKD (chronic kidney disease), stage III ([place])    Complication of anesthesia    a.) anesthesia awareness/premature emergence during colonoscopy   DDD (degenerative disc disease), lumbar    Dyspnea on  exertion    Dysrhythmia    IRREGULAR HEART BEAT   Family history of adverse reaction to anesthesia    sister difficult to put to sleep   GERD (gastroesophageal reflux disease)    Glaucoma    Gout    H/O tooth extraction    all lower teeth 1/19   Hemorrhoid    History of hiatal hernia    History of kidney stones    Hypertension    Hypothyroidism    Implantable loop recorder present 08/22/2021   a.) s/p implantation of [employer] LINQ Reveal ILR; serial #: [id number]   Long term current use of immunosuppressive drug    a.) MTX + apremilast   Migraines    Mixed hyperlipidemia    Multiple gastric ulcers    Osteoarthritis    Psoriasis    Psoriatic arthritis ([place])    a.) on apremilast   Rheumatoid arthritis ([place])    a.) on MTX   SS-A antibody positive    Wears dentures    partial upper and lower    Past Surgical History:  Procedure Laterality Date   ANKLE SURGERY Right    CARPAL TUNNEL RELEASE     x3   COLONOSCOPY  2000?   COLONOSCOPY WITH PROPOFOL N/A 10/22/2017   Procedure: COLONOSCOPY WITH PROPOFOL;  Surgeon: Lucilla Lame, MD;  Location: [place];  Service: Endoscopy;  Laterality: N/A;   ESOPHAGOGASTRODUODENOSCOPY (EGD) WITH PROPOFOL N/A 10/22/2017   Procedure: ESOPHAGOGASTRODUODENOSCOPY (EGD) WITH PROPOFOL;  Surgeon: Lucilla Lame, MD;  Location: [place];  Service: Endoscopy;  Laterality: N/A;   FUSION OF TALONAVICULAR JOINT Right 08/03/2015   Procedure: TAILOR NAVICULAR JOINT FUSION - RIGHT ;  Surgeon: [name], DPM;  Location: [facility];  Service: Podiatry;  Laterality: Right;   JOINT REPLACEMENT Right    knee x 3 ,right x1 and left x2   LOOP RECORDER IMPLANT  08/22/2021   Procedure: LOOP RECORDER IMPLANT; Location: [place]; Surgeon: Virl Axe, MD   POLYPECTOMY N/A 10/22/2017   Procedure: POLYPECTOMY;  Surgeon: Lucilla Lame, MD;  Location: [place];  Service: Endoscopy;  Laterality: N/A;   ROTATOR CUFF REPAIR Right 1995    TONSILLECTOMY     TOTAL KNEE REVISION Right 01/20/2018   Procedure: POLYETHYLENE EXCHANGE;  Surgeon: Dereck Leep, MD;  Location: [facility];  Service: Orthopedics;  Laterality: Right;   TOTAL KNEE REVISION Left 10/09/2021   Procedure: LEFT TOTAL KNEE REVISION WITH POLYETHYLENE EXCHANGE.;  Surgeon: Dereck Leep, MD;  Location: [facility];  Service: Orthopedics;  Laterality: Left;   TUBAL LIGATION     UPPER GI ENDOSCOPY  2000?    Family History  Problem Relation Age of Onset   Heart failure Mother    Gout Mother    Arthritis Mother    Hypertension Mother    Stroke Mother    Heart failure Father    Diabetes Father    Hyperlipidemia Father    COPD Sister    Cancer Maternal Aunt        breast   Cancer Maternal Uncle  kidney   Breast cancer Neg Hx     Social History:  reports that she quit smoking about 28 years ago. Her smoking use included cigarettes. She has a 40.00 pack-year smoking history. She has never used smokeless tobacco. She reports that she does not drink alcohol and does not use drugs.  Allergies:  Allergies  Allergen Reactions   Ampicillin Swelling   Penicillins Anaphylaxis and Swelling    IgE = 10 (11/05/2017)  Has patient had a PCN reaction causing immediate rash, facial/tongue/throat swelling, SOB or lightheadedness with hypotension: Yes Has patient had a PCN reaction causing severe rash involving mucus membranes or skin necrosis: No Has patient had a PCN reaction that required hospitalization: No Has patient had a PCN reaction occurring within the last 10 years: Yes If all of the above answers are "NO", then may proceed with Cephalosporin use.   Vancomycin Rash    Other reaction(s): Other (see comments)   Vibramycin [Doxycycline Calcium] Rash    Current Medications: No current facility-administered medications for this visit.   Current Outpatient Medications  Medication Sig Dispense Refill   acetaminophen (TYLENOL) 650 MG CR tablet Take 650 mg by  mouth every 8 (eight) hours as needed for pain.     albuterol (VENTOLIN HFA) 108 (90 Base) MCG/ACT inhaler Inhale 2 puffs into the lungs every 6 (six) hours as needed for wheezing or shortness of breath. 8 g 2   allopurinol (ZYLOPRIM) 100 MG tablet Take 200 mg by mouth every morning.      betamethasone dipropionate 0.05 % cream APPLY TO PSORIASIS ON HANDS TWICE DAILY ONLY. AVOID FACE, GROIN AND AXILLA 45 g 0   betamethasone valerate (VALISONE) 0.1 % cream Apply topically 2 (two) times daily as needed (Rash). 45 g 3   calcipotriene (DOVONOX) 0.005 % cream APPLY TOPICALLY TO PSORIASIS AREAS ON LEGS AND FEET ONCE OR TWICE DAILY 540 g 1   Cholecalciferol (VITAMIN D3) 2000 units TABS Take 2,000 Units by mouth daily.     clobetasol ointment (TEMOVATE) 0.05 % Apply topically 2 (two) times daily.     diclofenac sodium (VOLTAREN) 1 % GEL Apply 2 g topically 3 (three) times daily.      FARXIGA 10 MG TABS tablet Take 10 mg by mouth every morning.     furosemide (LASIX) 40 MG tablet Take 40 mg by mouth daily.     gabapentin (NEURONTIN) 600 MG tablet Take 1 tablet (600 mg total) by mouth at bedtime. 30 tablet 5   hydrALAZINE (APRESOLINE) 50 MG tablet Take 1 tablet by mouth twice daily 180 tablet 1   HYDROcodone-acetaminophen (NORCO) 7.5-325 MG tablet Take 1 tablet by mouth every 8 (eight) hours as needed for severe pain. Must last 30 days. 90 tablet 0   ketoconazole (NIZORAL) 2 % cream Apply topically daily as needed. 15 g 2   levothyroxine (SYNTHROID) 150 MCG tablet Take 1 tablet (150 mcg total) by mouth daily. 90 tablet 0   meclizine (ANTIVERT) 12.5 MG tablet TAKE 1 TABLET(12.5 MG) BY MOUTH THREE TIMES DAILY AS NEEDED FOR DIZZINESS 30 tablet 1   metoprolol tartrate (LOPRESSOR) 25 MG tablet Take 1 tablet (25 mg total) by mouth 2 (two) times daily. 180 tablet 0   montelukast (SINGULAIR) 10 MG tablet TAKE 1 TABLET(10 MG) BY MOUTH AT BEDTIME 90 tablet 1   Multiple Vitamins-Calcium (ONE-A-DAY WOMENS FORMULA PO)  Take 1 tablet by mouth daily.     mupirocin ointment (BACTROBAN) 2 % Apply 1 application topically 2 (  two) times daily. 22 g 0   nystatin cream (MYCOSTATIN) Apply 1 Application topically 2 (two) times daily. 30 g 0   omega-3 acid ethyl esters (LOVAZA) 1 g capsule Take 1 capsule (1 g total) by mouth 2 (two) times daily. 180 capsule 1   omeprazole (PRILOSEC) 40 MG capsule TAKE 1 CAPSULE(40 MG) BY MOUTH DAILY 90 capsule 1   OTEZLA 30 MG TABS Take 1 tablet by mouth daily.     Polyethyl Glycol-Propyl Glycol (SYSTANE OP) Place 1 drop into both eyes daily as needed (dry eyes).     potassium chloride SA (KLOR-CON M) 20 MEQ tablet Take 1 tablet (20 mEq total) by mouth daily. 90 tablet 1   tiZANidine (ZANAFLEX) 4 MG tablet Take 1 tablet (4 mg total) by mouth at bedtime. 60 tablet 5   traMADol (ULTRAM) 50 MG tablet Take 1-2 tablets (50-100 mg total) by mouth every 4 (four) hours as needed for moderate pain. 30 tablet 0   triamcinolone (KENALOG) 0.1 % Apply 1 application topically 2 (two) times daily. 30 g 0   triamterene-hydrochlorothiazide (MAXZIDE) 75-50 MG tablet Take 1 tablet by mouth daily. 90 tablet 1   acetaminophen (TYLENOL) 500 MG tablet Take 500 mg by mouth every 6 (six) hours as needed. (Patient not taking: Reported on 12/18/2021)     ferrous sulfate 325 (65 FE) MG tablet Take 325 mg by mouth daily with breakfast.  (Patient not taking: Reported on 09/27/2021)     methotrexate (RHEUMATREX) 2.5 MG tablet Take 7.5 mg by mouth every Friday. Take 3 tablets (2.77m x3) by mouth once weekly (Patient not taking: Reported on 12/18/2021)     Facility-Administered Medications Ordered in Other Visits  Medication Dose Route Frequency Provider Last Rate Last Admin   0.9 %  sodium chloride infusion  10 mL/hr Intravenous Once JBlake Divine MD       HYDROcodone-acetaminophen ([facility] 7.5-325 MG per tablet 1 tablet  1 tablet Oral Q6H PRN JBlake Divine MD   1 tablet at 12/18/21 2012    Review of Systems   Constitutional:  Negative for chills, diaphoresis, fever, malaise/fatigue and weight loss.  HENT:  Negative for congestion, ear discharge, ear pain, hearing loss, nosebleeds, sinus pain, sore throat and tinnitus.   Eyes:        Glaucoma  Respiratory:  Positive for shortness of breath (on exertion, due to asthma). Negative for cough, hemoptysis and sputum production.   Cardiovascular:  Positive for leg swelling (right ankle). Negative for chest pain and palpitations.  Gastrointestinal:  Negative for abdominal pain, blood in stool, constipation, diarrhea, heartburn, melena, nausea and vomiting.  Genitourinary:  Negative for dysuria, frequency, hematuria and urgency.  Musculoskeletal:  Positive for joint pain (arthritis). Negative for back pain, myalgias and neck pain.       Status post knee replacement  Skin:  Negative for itching and rash.  Neurological:  Positive for sensory change (foot numbness s/p surgery). Negative for dizziness, tingling, weakness and headaches.  Endo/Heme/Allergies:  Does not bruise/bleed easily.  Psychiatric/Behavioral:  Negative for depression and memory loss. The patient is not nervous/anxious and does not have insomnia.   All other systems reviewed and are negative.   Performance status (ECOG): 1-2  Vitals Blood pressure (!) 116/53, pulse 82, temperature 98.4 F (36.9 C), temperature source Tympanic, weight 254 lb 1.6 oz (115.3 kg), SpO2 100 %.   Physical Exam Vitals and nursing note reviewed.  Constitutional:      General: She is not in acute  distress.    Appearance: She is not diaphoretic.  Eyes:     General: No scleral icterus.    Conjunctiva/sclera: Conjunctivae normal.  Neurological:     Mental Status: She is alert and oriented to person, place, and time.  Psychiatric:        Behavior: Behavior normal.        Thought Content: Thought content normal.        Judgment: Judgment normal.    Labs    Latest Ref Rng & Units 12/18/2021    7:48 PM  12/18/2021    1:12 PM 11/20/2021    1:23 PM  CBC  WBC 4.0 - 10.5 K/uL 6.7  7.3    Hemoglobin 12.0 - 15.0 g/dL 6.0  5.8  7.4   Hematocrit 36.0 - 46.0 % 20.0  19.1  22.8   Platelets 150 - 400 K/uL 271  234        Latest Ref Rng & Units 12/18/2021    7:48 PM 11/20/2021    1:23 PM 10/14/2021    3:19 AM  CMP  Glucose 70 - 99 mg/dL 110  100  190   BUN 8 - 23 mg/dL 31  36  42   Creatinine 0.44 - 1.00 mg/dL 2.28  2.62  1.64   Sodium 135 - 145 mmol/L 138  138  136   Potassium 3.5 - 5.1 mmol/L 4.0  3.7  3.4   Chloride 98 - 111 mmol/L 105  102  101   CO2 22 - 32 mmol/L _0 Calcium 8.9 - 10.3 mg/dL 10.1  10.2  9.5   Total Protein 6.5 - 8.1 g/dL  6.8    Total Bilirubin 0.3 - 1.2 mg/dL  0.4    Alkaline Phos 38 - 126 U/L  67    AST 15 - 41 U/L  22    ALT 0 - 44 U/L  11

## 2021-12-18 NOTE — Assessment & Plan Note (Signed)
Acute drop of hemoglobin is likely secondary to blood loss from the replacement surgery. Iron panel results came back after she left our clinic.  Recommend her to empirically start oral iron supplementation. Consistent with iron deficiency anemia.  Recommend patient to follow-up with our clinic next week for IV Venofer treatments. Proceed with Retacrit 20000 units x 1 today.

## 2021-12-18 NOTE — Assessment & Plan Note (Signed)
Labs reviewed and discussed with patient. Acute drop of hemoglobin to 5.8. Recommend patient to go to emergency room for PRBC transfusion.  Recommend 2 units.  I called ER and spoke to triage nurse

## 2021-12-18 NOTE — ED Provider Notes (Signed)
[facility] Provider Note    Event Date/Time   First MD Initiated Contact with Patient 12/18/21 1919     (approximate)   History   Chief Complaint Abnormal Lab   HPI  Michele Moore is a 71 y.o. female with past medical history of hypertension, hyperlipidemia, rheumatoid arthritis, CKD, and chronic pain syndrome who presents to the ED complaining of abnormal labs.  Patient reports that she was following up with her hematologist earlier today, who noted that her hemoglobin was 5.8.  She was referred to the ED for blood transfusion, states that she has been feeling lightheaded and dizzy at times for the past few days, also reports feeling cold.  She denies any chest pain or shortness of breath and has not noticed any bleeding.  She specifically denies any dark tarry stools and does not take any blood thinners.  She reports that she has required transfusion in the past for similar issues.     Physical Exam   Triage Vital Signs: ED Triage Vitals [12/18/21 1443]  Enc Vitals Group     BP 129/72     Pulse Rate 69     Resp 20     Temp 98.6 F (37 C)     Temp Source Oral     SpO2 98 %     Weight 254 lb (115.2 kg)     Height '5\' 11"'$  (1.803 m)     Head Circumference      Peak Flow      Pain Score 0     Pain Loc      Pain Edu?      Excl. in [place]?     Most recent vital signs: Vitals:   12/18/21 2015 12/18/21 2236  BP: (!) 119/54 102/83  Pulse: 84 85  Resp: 18 18  Temp: 98.7 F (37.1 C) 98 F (36.7 C)  SpO2: 100% 97%    Constitutional: Alert and oriented. Eyes: Conjunctivae are normal. Head: Atraumatic. Nose: No congestion/rhinnorhea. Mouth/Throat: Mucous membranes are moist.  Cardiovascular: Normal rate, regular rhythm. Grossly normal heart sounds.  2+ radial pulses bilaterally. Respiratory: Normal respiratory effort.  No retractions. Lungs CTAB. Gastrointestinal: Soft and nontender. No distention.  Rectal exam with light brown guaiac negative  stool. Musculoskeletal: No lower extremity tenderness nor edema.  Neurologic:  Normal speech and language. No gross focal neurologic deficits are appreciated.    ED Results / Procedures / Treatments   Labs (all labs ordered are listed, but only abnormal results are displayed) Labs Reviewed  CBC WITH DIFFERENTIAL/PLATELET - Abnormal; Notable for the following components:      Result Value   RBC 2.19 (*)    Hemoglobin 6.0 (*)    HCT 20.0 (*)    RDW 18.6 (*)    nRBC 0.3 (*)    All other components within normal limits  BASIC METABOLIC PANEL - Abnormal; Notable for the following components:   Glucose, Bld 110 (*)    BUN 31 (*)    Creatinine, Ser 2.28 (*)    GFR, Estimated 22 (*)    All other components within normal limits  PREPARE RBC (CROSSMATCH)  TYPE AND SCREEN     PROCEDURES:  Critical Care performed: Yes, see critical care procedure note(s)  .Critical Care  Performed by: Blake Divine, MD Authorized by: Blake Divine, MD   Critical care provider statement:    Critical care time (minutes):  30   Critical care time was exclusive of:  Separately billable procedures and treating other patients and teaching time   Critical care was necessary to treat or prevent imminent or life-threatening deterioration of the following conditions:  Circulatory failure   Critical care was time spent personally by me on the following activities:  Development of treatment plan with patient or surrogate, discussions with consultants, evaluation of patient's response to treatment, examination of patient, ordering and review of laboratory studies, ordering and review of radiographic studies, ordering and performing treatments and interventions, pulse oximetry, re-evaluation of patient's condition and review of old charts   I assumed direction of critical care for this patient from another provider in my specialty: no      MEDICATIONS ORDERED IN ED: Medications  0.9 %  sodium chloride  infusion (has no administration in time range)  HYDROcodone-acetaminophen (NORCO) 7.5-325 MG per tablet 1 tablet (1 tablet Oral Given 12/18/21 2012)     IMPRESSION / MDM / [place] / ED COURSE  I reviewed the triage vital signs and the nursing notes.                              71 y.o. female with past medical history of hypertension, hyperlipidemia, rheumatoid arthritis, CKD, and chronic pain syndrome who presents to the ED complaining of low hemoglobin noted on outpatient labs with hematology.  Patient's presentation is most consistent with acute presentation with potential threat to life or bodily function.  Differential diagnosis includes, but is not limited to, anemia of chronic disease, iron deficiency anemia, GI bleeding, AKI, electrolyte abnormality.  Patient nontoxic-appearing and in no acute distress, vital signs are unremarkable.  She has not noticed any bleeding recently and rectal exam shows light brown stool that is guaiac negative.  Labs performed earlier today seem most consistent with anemia of chronic disease, although patient is already on iron supplementation and follows closely with hematology.  We will recheck hemoglobin to ensure no significant drop from earlier today, plan to transfuse 2 units of PRBCs.  If there is no significant ongoing drop in hemoglobin, then patient would be appropriate for discharge home with close hematology follow-up.  CBC shows hemoglobin of 6 and we will transfuse 1 unit of PRBCs.  Renal function stable compared to previous with no significant electrolyte abnormality.  Patient turned over to oncoming provider pending completion of transfusion, after which she would be appropriate for discharge home with hematology follow-up for ongoing EPO infusions.      FINAL CLINICAL IMPRESSION(S) / ED DIAGNOSES   Final diagnoses:  Anemia due to chronic kidney disease, unspecified CKD stage     Rx / DC Orders   ED Discharge Orders      None        Note:  This document was prepared using Dragon voice recognition software and may include unintentional dictation errors.   Blake Divine, MD 12/18/21 2246

## 2021-12-18 NOTE — Assessment & Plan Note (Signed)
#  Rheumatoid arthritis on methotrexate.  Continue folic acid 3 days/week.   

## 2021-12-18 NOTE — Discharge Instructions (Addendum)
Follow-up closely with your hematologist.  Return to the ER for worsening symptoms, persistent vomiting, difficulty breathing or other concerns.

## 2021-12-18 NOTE — Addendum Note (Signed)
Addended by: Earlie Server on: 12/18/2021 08:58 PM   Modules accepted: Orders

## 2021-12-18 NOTE — ED Triage Notes (Signed)
Pt sent from PCP d/t hemoglobin being 5 and needing a blood transfusion. Pt reports feeling dizzy and a hx of prior blood transfusions

## 2021-12-19 ENCOUNTER — Telehealth: Payer: Self-pay

## 2021-12-19 NOTE — ED Notes (Signed)
Pt received discharge papers and verbalized understanding of discharge instructions. IV removed. Pt taken to exit via wheelchair. Charge RN in lobby waiting for pt to arrange transport.

## 2021-12-19 NOTE — Telephone Encounter (Signed)
Please schedule and inform pt of appts:   -IV venofer twice a week for 2 weeks (total of 4 doses). Please make sure infusions are at least 3 days apart  -labs in 4 week -MD/ venofer or retacrit 1-2 days after labs

## 2021-12-19 NOTE — Telephone Encounter (Signed)
-----   Message from Evelina Dun, RN sent at 12/19/2021  8:42 AM EDT -----  ----- Message ----- From: Earlie Server, MD Sent: 12/18/2021   8:58 PM EDT To: Evelina Dun, RN  Please arrange patient to get IV Venofer twice weekly x4 doses-[3 days apart] She should follow-up in 4 weeks, lab prior to MD +/- Retacrit +/- Venofer.

## 2021-12-19 NOTE — ED Provider Notes (Signed)
-----------------------------------------   11:33 PM on 12/18/2021 -----------------------------------------   Care assumed from Dr. Charna Archer. Patient is pending pRBC transfusion per hematology after which she will be appropriate for discharge home with hematology follow-up.   ----------------------------------------- 2:19 AM on 12/19/2021 -----------------------------------------   Transfusion nearly completed.  Patient remains hemodynamically stable, voices no complaints.  Reviewed records from both hematology as well as previous provider who both state patient may be discharged after transfusion without rechecking H/H.   Paulette Blanch, MD 12/19/21 0600

## 2021-12-20 DIAGNOSIS — T8484XD Pain due to internal orthopedic prosthetic devices, implants and grafts, subsequent encounter: Secondary | ICD-10-CM | POA: Diagnosis not present

## 2021-12-20 DIAGNOSIS — M069 Rheumatoid arthritis, unspecified: Secondary | ICD-10-CM | POA: Diagnosis not present

## 2021-12-20 DIAGNOSIS — M47816 Spondylosis without myelopathy or radiculopathy, lumbar region: Secondary | ICD-10-CM | POA: Diagnosis not present

## 2021-12-20 DIAGNOSIS — T84023D Instability of internal left knee prosthesis, subsequent encounter: Secondary | ICD-10-CM | POA: Diagnosis not present

## 2021-12-20 DIAGNOSIS — G894 Chronic pain syndrome: Secondary | ICD-10-CM | POA: Diagnosis not present

## 2021-12-20 DIAGNOSIS — L4050 Arthropathic psoriasis, unspecified: Secondary | ICD-10-CM | POA: Diagnosis not present

## 2021-12-20 LAB — BPAM RBC
Blood Product Expiration Date: 202311262359
Blood Product Expiration Date: 202311262359
ISSUE DATE / TIME: 202310252330
Unit Type and Rh: 5100
Unit Type and Rh: 5100

## 2021-12-20 LAB — TYPE AND SCREEN
ABO/RH(D): O POS
Antibody Screen: NEGATIVE
Unit division: 0
Unit division: 0

## 2021-12-20 LAB — PREPARE RBC (CROSSMATCH)

## 2021-12-23 ENCOUNTER — Inpatient Hospital Stay: Payer: Medicare Other

## 2021-12-23 VITALS — BP 111/61 | HR 74 | Temp 98.3°F

## 2021-12-23 DIAGNOSIS — N183 Chronic kidney disease, stage 3 unspecified: Secondary | ICD-10-CM

## 2021-12-23 DIAGNOSIS — D631 Anemia in chronic kidney disease: Secondary | ICD-10-CM | POA: Diagnosis not present

## 2021-12-23 DIAGNOSIS — I129 Hypertensive chronic kidney disease with stage 1 through stage 4 chronic kidney disease, or unspecified chronic kidney disease: Secondary | ICD-10-CM | POA: Diagnosis not present

## 2021-12-23 DIAGNOSIS — N184 Chronic kidney disease, stage 4 (severe): Secondary | ICD-10-CM | POA: Diagnosis not present

## 2021-12-23 DIAGNOSIS — D649 Anemia, unspecified: Secondary | ICD-10-CM

## 2021-12-23 DIAGNOSIS — Z79899 Other long term (current) drug therapy: Secondary | ICD-10-CM | POA: Diagnosis not present

## 2021-12-23 MED ORDER — SODIUM CHLORIDE 0.9 % IV SOLN
200.0000 mg | Freq: Once | INTRAVENOUS | Status: AC
  Administered 2021-12-23: 200 mg via INTRAVENOUS
  Filled 2021-12-23: qty 200

## 2021-12-23 NOTE — Patient Instructions (Signed)

## 2021-12-26 ENCOUNTER — Inpatient Hospital Stay: Payer: Medicare Other

## 2021-12-26 ENCOUNTER — Inpatient Hospital Stay: Payer: Medicare Other | Attending: Oncology

## 2021-12-26 VITALS — BP 107/51 | HR 71 | Temp 99.2°F | Resp 16

## 2021-12-26 DIAGNOSIS — D631 Anemia in chronic kidney disease: Secondary | ICD-10-CM | POA: Diagnosis not present

## 2021-12-26 DIAGNOSIS — N1832 Chronic kidney disease, stage 3b: Secondary | ICD-10-CM | POA: Insufficient documentation

## 2021-12-26 DIAGNOSIS — Z79899 Other long term (current) drug therapy: Secondary | ICD-10-CM | POA: Diagnosis not present

## 2021-12-26 DIAGNOSIS — I129 Hypertensive chronic kidney disease with stage 1 through stage 4 chronic kidney disease, or unspecified chronic kidney disease: Secondary | ICD-10-CM | POA: Diagnosis not present

## 2021-12-26 DIAGNOSIS — D649 Anemia, unspecified: Secondary | ICD-10-CM

## 2021-12-26 DIAGNOSIS — N183 Chronic kidney disease, stage 3 unspecified: Secondary | ICD-10-CM

## 2021-12-26 MED ORDER — SODIUM CHLORIDE 0.9 % IV SOLN
200.0000 mg | Freq: Once | INTRAVENOUS | Status: AC
  Administered 2021-12-26: 200 mg via INTRAVENOUS
  Filled 2021-12-26: qty 200

## 2021-12-27 DIAGNOSIS — T8484XD Pain due to internal orthopedic prosthetic devices, implants and grafts, subsequent encounter: Secondary | ICD-10-CM | POA: Diagnosis not present

## 2021-12-27 DIAGNOSIS — M47816 Spondylosis without myelopathy or radiculopathy, lumbar region: Secondary | ICD-10-CM | POA: Diagnosis not present

## 2021-12-27 DIAGNOSIS — M069 Rheumatoid arthritis, unspecified: Secondary | ICD-10-CM | POA: Diagnosis not present

## 2021-12-27 DIAGNOSIS — T84023D Instability of internal left knee prosthesis, subsequent encounter: Secondary | ICD-10-CM | POA: Diagnosis not present

## 2021-12-27 DIAGNOSIS — L4050 Arthropathic psoriasis, unspecified: Secondary | ICD-10-CM | POA: Diagnosis not present

## 2021-12-27 DIAGNOSIS — G894 Chronic pain syndrome: Secondary | ICD-10-CM | POA: Diagnosis not present

## 2021-12-30 ENCOUNTER — Inpatient Hospital Stay: Payer: Medicare Other

## 2021-12-30 VITALS — BP 108/61 | HR 75 | Temp 99.5°F | Resp 20

## 2021-12-30 DIAGNOSIS — N183 Chronic kidney disease, stage 3 unspecified: Secondary | ICD-10-CM

## 2021-12-30 DIAGNOSIS — Z79899 Other long term (current) drug therapy: Secondary | ICD-10-CM | POA: Diagnosis not present

## 2021-12-30 DIAGNOSIS — N1832 Chronic kidney disease, stage 3b: Secondary | ICD-10-CM | POA: Diagnosis not present

## 2021-12-30 DIAGNOSIS — D631 Anemia in chronic kidney disease: Secondary | ICD-10-CM | POA: Diagnosis not present

## 2021-12-30 DIAGNOSIS — D649 Anemia, unspecified: Secondary | ICD-10-CM

## 2021-12-30 DIAGNOSIS — I129 Hypertensive chronic kidney disease with stage 1 through stage 4 chronic kidney disease, or unspecified chronic kidney disease: Secondary | ICD-10-CM | POA: Diagnosis not present

## 2021-12-30 MED ORDER — SODIUM CHLORIDE 0.9 % IV SOLN
200.0000 mg | Freq: Once | INTRAVENOUS | Status: AC
  Administered 2021-12-30: 200 mg via INTRAVENOUS
  Filled 2021-12-30: qty 200

## 2021-12-30 MED ORDER — SODIUM CHLORIDE 0.9 % IV SOLN
INTRAVENOUS | Status: DC
  Filled 2021-12-30: qty 250

## 2021-12-31 ENCOUNTER — Ambulatory Visit
Payer: Medicare Other | Attending: Student in an Organized Health Care Education/Training Program | Admitting: Student in an Organized Health Care Education/Training Program

## 2021-12-31 ENCOUNTER — Encounter: Payer: Self-pay | Admitting: Student in an Organized Health Care Education/Training Program

## 2021-12-31 ENCOUNTER — Ambulatory Visit: Payer: Self-pay

## 2021-12-31 VITALS — BP 133/61 | HR 90 | Temp 98.3°F | Resp 16 | Ht 71.0 in | Wt 251.0 lb

## 2021-12-31 DIAGNOSIS — M47812 Spondylosis without myelopathy or radiculopathy, cervical region: Secondary | ICD-10-CM | POA: Insufficient documentation

## 2021-12-31 DIAGNOSIS — G894 Chronic pain syndrome: Secondary | ICD-10-CM | POA: Diagnosis not present

## 2021-12-31 DIAGNOSIS — M47816 Spondylosis without myelopathy or radiculopathy, lumbar region: Secondary | ICD-10-CM | POA: Insufficient documentation

## 2021-12-31 DIAGNOSIS — G8929 Other chronic pain: Secondary | ICD-10-CM | POA: Insufficient documentation

## 2021-12-31 DIAGNOSIS — M19012 Primary osteoarthritis, left shoulder: Secondary | ICD-10-CM | POA: Diagnosis not present

## 2021-12-31 DIAGNOSIS — M5136 Other intervertebral disc degeneration, lumbar region: Secondary | ICD-10-CM | POA: Insufficient documentation

## 2021-12-31 DIAGNOSIS — M25562 Pain in left knee: Secondary | ICD-10-CM | POA: Diagnosis not present

## 2021-12-31 DIAGNOSIS — M5416 Radiculopathy, lumbar region: Secondary | ICD-10-CM | POA: Diagnosis not present

## 2021-12-31 DIAGNOSIS — M19011 Primary osteoarthritis, right shoulder: Secondary | ICD-10-CM | POA: Diagnosis not present

## 2021-12-31 DIAGNOSIS — Z96652 Presence of left artificial knee joint: Secondary | ICD-10-CM | POA: Insufficient documentation

## 2021-12-31 DIAGNOSIS — M1612 Unilateral primary osteoarthritis, left hip: Secondary | ICD-10-CM | POA: Diagnosis not present

## 2021-12-31 MED ORDER — HYDROCODONE-ACETAMINOPHEN 7.5-325 MG PO TABS: 1.0000 | ORAL_TABLET | Freq: Three times a day (TID) | ORAL | 0 refills | Status: AC | PRN

## 2021-12-31 MED ORDER — HYDROCODONE-ACETAMINOPHEN 7.5-325 MG PO TABS: 1.0000 | ORAL_TABLET | Freq: Three times a day (TID) | ORAL | 0 refills | Status: DC | PRN

## 2021-12-31 NOTE — Progress Notes (Signed)
Nursing Pain Medication Assessment:  Safety precautions to be maintained throughout the outpatient stay will include: orient to surroundings, keep bed in low position, maintain call bell within reach at all times, provide assistance with transfer out of bed and ambulation.  Medication Inspection Compliance: Pill count conducted under aseptic conditions, in front of the patient. Neither the pills nor the bottle was removed from the patient's sight at any time. Once count was completed pills were immediately returned to the patient in their original bottle.  Medication: Hydrocodone/APAP Pill/Patch Count:  60 of 90 pills remain Pill/Patch Appearance: Markings consistent with prescribed medication Bottle Appearance: Standard pharmacy container. Clearly labeled. Filled Date: 10 / 30 / 2023 Last Medication intake:  Today

## 2021-12-31 NOTE — Progress Notes (Signed)
PROVIDER NOTE: Information contained herein reflects review and annotations entered in association with encounter. Interpretation of such information and data should be left to medically-trained personnel. Information provided to patient can be located elsewhere in the medical record under "Patient Instructions". Document created using STT-dictation technology, any transcriptional errors that may result from process are unintentional.    Patient: Michele Moore  Service Category: E/M  Provider: Gillis Santa, MD  DOB: 01-Mar-1950  DOS: 12/31/2021  Specialty: Interventional Pain Management  MRN: [id number]  Setting: Ambulatory outpatient  PCP: Juline Patch, MD  Type: Established Patient    Referring Provider: Juline Patch, MD  Location: Office  Delivery: Face-to-face     HPI  [name], a 71 y.o. year old female, is here today because of her Chronic knee pain after total replacement of left knee joint [M25.562, G89.29, Z96.652]. [name]'s primary complain today is Back Pain (Lumbar left ), Shoulder Pain (Right ), Knee Pain (Left s/p joint replacement ), and Hip Pain (Left ) Last encounter: My last encounter with her was on 11/05/21  Pertinent problems: [name] has Chronic knee pain after total replacement of left knee joint; Lumbar radiculopathy; Lumbar degenerative disc disease; Chronic pain syndrome; Spinal stenosis, lumbar region, with neurogenic claudication; Cervical facet syndrome; Primary osteoarthritis of right shoulder; Sprain of anterior talofibular ligament of right ankle; Severe obesity (BMI 35.0-35.9 with comorbidity) ([place]); CKD (chronic kidney disease), stage III ([place]); Primary osteoarthritis of left hip; and Lumbar spondylosis on their pertinent problem list. Pain Assessment: Severity of Chronic pain is reported as a 8 /10. Location: Back (see visit info for additional pain sites.) Lower, Left/back pain into leg and patient reports that provider did the knee replacement says she has  a pinched nerve in her back and that is causing pain in th e knee and joint replacement is not heeling properly.. Onset: More than a month ago. Quality: Discomfort, Constant. Timing: Constant. Modifying factor(s): medications and rest. Vitals:  height is _0  (1.803 m) and weight is 251 lb (113.9 kg). Her temporal temperature is 98.3 F (36.8 C). Her blood pressure is 133/61 and her pulse is 90. Her respiration is 16 and oxygen saturation is 99%.   Reason for encounter:   [name] presents today for medication management.  No significant change in her medical history since her last clinic visit with me other than that she had to go to the hospital for a blood transfusion for anemia of chronic disease. Patient continues to have persistent left knee pain even after her left total knee arthroplasty with Dr. Marry Guan on 10/09/2021.  She saw PA Tamala Julian on 12/26/2021.  She has been working with home health. She continues with physical therapy. Having increased lumbar radicular pain, left L4/5. Previous L-ESI was over 3 years ago, discussed repeating. Also having increased posterior right shoulder pain related to OA and suprascapular nerve disorder- disdcussed R SSNB under fluoroscopy   Pharmacotherapy Assessment  Analgesic: Norco 7.5 mg TID PRN #90/month, 22.5    Monitoring: [place] PMP: PDMP reviewed during this encounter.       Pharmacotherapy: No side-effects or adverse reactions reported. Compliance: No problems identified. Effectiveness: Clinically acceptable.  Janett Billow, RN  12/31/2021  3:03 PM  Sign when Signing Visit Nursing Pain Medication Assessment:  Safety precautions to be maintained throughout the outpatient stay will include: orient to surroundings, keep bed in low position, maintain call bell within reach at all times, provide assistance with transfer out  of bed and ambulation.  Medication Inspection Compliance: Pill count conducted under aseptic conditions, in front of the  patient. Neither the pills nor the bottle was removed from the patient's sight at any time. Once count was completed pills were immediately returned to the patient in their original bottle.  Medication: Hydrocodone/APAP Pill/Patch Count:  60 of 90 pills remain Pill/Patch Appearance: Markings consistent with prescribed medication Bottle Appearance: Standard pharmacy container. Clearly labeled. Filled Date: 10 / 30 / 2023 Last Medication intake:  Today    UDS:  Summary  Date Value Ref Range Status  08/06/2021 Note  Final    Comment:    ==================================================================== [facility] (MW) ==================================================================== Test                             Result       Flag       Units  Drug Present and Declared for Prescription Verification   Hydrocodone                    1383         EXPECTED   ng/mg creat   Hydromorphone                  260          EXPECTED   ng/mg creat   Norhydrocodone                 557          EXPECTED   ng/mg creat    Sources of hydrocodone include scheduled prescription medications.    Hydromorphone and norhydrocodone are expected metabolites of    hydrocodone. Hydromorphone is also available as a scheduled    prescription medication.  ==================================================================== Test                      Result    Flag   Units      Ref Range   Creatinine              47               mg/dL      >=20 ==================================================================== Declared Medications:  The flagging and interpretation on this report are based on the  following declared medications.  Unexpected results may arise from  inaccuracies in the declared medications.   **Note: The testing scope of this panel includes these medications:   Hydrocodone (Norco)   **Note: The testing scope of this panel does not include the  following reported medications:    Acetaminophen (Norco)  Albuterol (Ventolin HFA)  Allopurinol (Zyloprim)  Apremilast  Betamethasone  Calcipotriene  Clobetasol (Temovate)  Dapagliflozin (Farxiga)  Fish Oil  Fluticasone (Flonase)  Furosemide (Lasix)  Gabapentin (Neurontin)  Hydralazine (Apresoline)  Hydrochlorothiazide (Maxzide)  Iron  Ketoconazole (Nizoral)  Levothyroxine (Synthroid)  Losartan (Cozaar)  Meclizine (Antivert)  Methotrexate  Mometasone (Nasonex)  Montelukast (Singulair)  Multivitamin  Mupirocin (Bactroban)  Omeprazole (Prilosec)  Polyethylene Glycol  Potassium (Klor-Con)  Tizanidine (Zanaflex)  Topical  Topical Diclofenac (Voltaren)  Triamcinolone (Kenalog)  Triamterene (Maxzide)  Vitamin D3 ==================================================================== For clinical consultation, please call [phone]. ====================================================================      ROS  Constitutional: Denies any fever or chills Gastrointestinal: No reported hemesis, hematochezia, vomiting, or acute GI distress Musculoskeletal:  Low back pain with radiation to left hip and left leg, right shoulder pain Neurological: No reported episodes of  acute onset apraxia, aphasia, dysarthria, agnosia, amnesia, paralysis, loss of coordination, or loss of consciousness  Medication Review  Apremilast, HYDROcodone-acetaminophen, Multiple Vitamins-Calcium, Polyethyl Glycol-Propyl Glycol, Vitamin D3, acetaminophen, albuterol, allopurinol, betamethasone dipropionate, betamethasone valerate, calcipotriene, clobetasol ointment, dapagliflozin propanediol, diclofenac sodium, fluticasone, furosemide, gabapentin, hydrALAZINE, ketoconazole, levothyroxine, losartan, meclizine, metoprolol tartrate, mometasone, montelukast, mupirocin ointment, nystatin cream, omega-3 acid ethyl esters, omeprazole, potassium chloride SA, tiZANidine, traMADol, triamcinolone cream, and triamterene-hydrochlorothiazide  History Review   Allergy: Ms. Vecchio is allergic to ampicillin, penicillins, vancomycin, and vibramycin [doxycycline calcium]. Drug: Ms. Dexheimer  reports no history of drug use. Alcohol:  reports no history of alcohol use. Tobacco:  reports that she quit smoking about 28 years ago. Her smoking use included cigarettes. She has a 40.00 pack-year smoking history. She has never used smokeless tobacco. Social: Ms. Openshaw  reports that she quit smoking about 28 years ago. Her smoking use included cigarettes. She has a 40.00 pack-year smoking history. She has never used smokeless tobacco. She reports that she does not drink alcohol and does not use drugs. Medical:  has a past medical history of (HFpEF) heart failure with preserved ejection fraction ([place]) (01/13/2014), Allergy, ANA positive, Anemia of chronic renal failure, stage 3 (moderate), unspecified whether stage 3a or 3b CKD ([place]), Aortic atherosclerosis ([place]), Asthma, Cataract, Chronic pain syndrome, Chronic, continuous use of opioids, CKD (chronic kidney disease), stage III ([place]), Complication of anesthesia, DDD (degenerative disc disease), lumbar, Dyspnea on exertion, Dysrhythmia, Family history of adverse reaction to anesthesia, GERD (gastroesophageal reflux disease), Glaucoma, Gout, H/O tooth extraction, Hemorrhoid, History of hiatal hernia, History of kidney stones, Hypertension, Hypothyroidism, Implantable loop recorder present (08/22/2021), Long term current use of immunosuppressive drug, Migraines, Mixed hyperlipidemia, Multiple gastric ulcers, Osteoarthritis, Psoriasis, Psoriatic arthritis ([place]), Rheumatoid arthritis ([place]), SS-A antibody positive, and Wears dentures. Surgical: Ms. Desantiago  has a past surgical history that includes Carpal tunnel release; Rotator cuff repair (Right, 1995); Ankle surgery (Right); Tubal ligation; Colonoscopy (2000?); Upper gi endoscopy (2000?); Tonsillectomy; Fusion of talonavicular joint (Right, 08/03/2015); Colonoscopy with propofol (N/A,  10/22/2017); Esophagogastroduodenoscopy (egd) with propofol (N/A, 10/22/2017); polypectomy (N/A, 10/22/2017); Total knee revision (Right, 01/20/2018); Joint replacement (Right); Loop recorder implant (08/22/2021); and Total knee revision (Left, 10/09/2021). Family: family history includes Arthritis in her mother; COPD in her sister; Cancer in her maternal aunt and maternal uncle; Diabetes in her father; Gout in her mother; Heart failure in her father and mother; Hyperlipidemia in her father; Hypertension in her mother; Stroke in her mother.  Laboratory Chemistry Profile   Renal Lab Results  Component Value Date   BUN 31 (H) 12/18/2021   CREATININE 2.28 (H) 12/18/2021   BCR 15 09/16/2021   GFRAA 30 (L) 08/28/2019   GFRNONAA 22 (L) 12/18/2021    Hepatic Lab Results  Component Value Date   AST 22 11/20/2021   ALT 11 11/20/2021   ALBUMIN 3.7 11/20/2021   ALKPHOS 67 11/20/2021    Electrolytes Lab Results  Component Value Date   NA 138 12/18/2021   K 4.0 12/18/2021   CL 105 12/18/2021   CALCIUM 10.1 12/18/2021   MG 2.0 10/01/2018   PHOS 3.0 09/16/2021    Bone No results found for: "VD25OH", "VD125OH2TOT", "LM7867JQ4", "BE0100FH2", "25OHVITD1", "25OHVITD2", "25OHVITD3", "TESTOFREE", "TESTOSTERONE"  Inflammation (CRP: Acute Phase) (ESR: Chronic Phase) Lab Results  Component Value Date   CRP 1.6 (H) 09/27/2021   ESRSEDRATE 79 (H) 09/27/2021         Note: Above Lab results reviewed.  Recent Imaging Review  CUP PACEART  REMOTE DEVICE CHECK ILR summary report received. Battery status OK. Normal device function. No new brady, or pause episodes. No true AF episodes, EGM's have shown SR/ST with ectopy, burden 1.7%, 1 tachy episodes.  Cozaar was discontinued and Metoprolol was started Monthly  summary reports and ROV/PRN LA Note: Reviewed        Physical Exam  General appearance: Well nourished, well developed, and well hydrated. In no apparent acute distress Mental status: Alert,  oriented x 3 (person, place, & time)       Respiratory: No evidence of acute respiratory distress Eyes: PERLA Vitals: BP 133/61 (BP Location: Left Arm, Patient Position: Sitting, Cuff Size: Large)   Pulse 90   Temp 98.3 F (36.8 C) (Temporal)   Resp 16   Ht _0  (1.803 m)   Wt 251 lb (113.9 kg)   SpO2 99%   BMI 35.01 kg/m  BMI: Estimated body mass index is 35.01 kg/m as calculated from the following:   Height as of this encounter: _1  (1.803 m).   Weight as of this encounter: 251 lb (113.9 kg). Ideal: Ideal body weight: 70.8 kg (156 lb 1.4 oz) Adjusted ideal body weight: 88 kg (194 lb 0.8 oz)  Cervical Spine Area Exam  Skin & Axial Inspection: No masses, redness, edema, swelling, or associated skin lesions Alignment: Symmetrical Functional ROM: Diminished ROM      Stability: No instability detected Muscle Tone/Strength: Functionally intact. No obvious neuro-muscular anomalies detected. Sensory (Neurological): Articular pain pattern and musculoskeletal Palpation: Complains of area being tender to palpation                          Upper Extremity (UE) Exam      Side: Right upper extremity   Side: Left upper extremity    Skin & Extremity Inspection: Right shoulder abnormality   Skin & Extremity Inspection: Skin color, temperature, and hair growth are WNL. No peripheral edema or cyanosis. No masses, redness, swelling, asymmetry, or associated skin lesions. No contractures.    Functional ROM: Decreased ROM for shoulder and elbow   Functional ROM: Unrestricted ROM            Muscle Tone/Strength: Functionally intact. No obvious neuro-muscular anomalies detected.   Muscle Tone/Strength: Functionally intact. No obvious neuro-muscular anomalies detected.    Sensory (Neurological): Musculoskeletal pain pattern           Sensory (Neurological): Unimpaired            Palpation: No palpable anomalies               Palpation: No palpable anomalies                Provocative Test(s):   Phalen's test: deferred Tinel's test: deferred Apley's scratch test (touch opposite shoulder):  Action 1 (Across chest): Decreased ROM Action 2 (Overhead): Decreased ROM Action 3 (LB reach): Decreased ROM     Provocative Test(s):  Phalen's test: deferred Tinel's test: deferred Apley's scratch test (touch opposite shoulder):  Action 1 (Across chest): Decreased ROM Action 2 (Overhead): Decreased ROM Action 3 (LB reach): Decreased ROM        Thoracic Spine Area Exam  Skin & Axial Inspection: No masses, redness, or swelling Alignment: Symmetrical Functional ROM: Unrestricted ROM Stability: No instability detected Muscle Tone/Strength: Functionally intact. No obvious neuro-muscular anomalies detected. Sensory (Neurological): Unimpaired Muscle strength & Tone: No palpable anomalies   Lumbar Spine Area Exam  Skin & Axial Inspection:  No masses, redness, or swelling Alignment: Symmetrical Functional ROM: Improved after treatment       Stability: No instability detected Muscle Tone/Strength: Functionally intact. No obvious neuro-muscular anomalies detected. Sensory (Neurological): Dermatomal pain pattern, left L4-L5   Gait & Posture Assessment  Ambulation: Limited Gait: Antalgic Posture: Difficulty standing up straight, due to pain    Lower Extremity Exam      Side: Right lower extremity   Side: Left lower extremity  Stability: No instability observed           Stability: No instability observed          Skin & Extremity Inspection: Skin color, temperature, and hair growth are WNL. No peripheral edema or cyanosis. No masses, redness, swelling, asymmetry, or associated skin lesions. No contractures.   Skin & Extremity Inspection: Skin color, temperature, and hair growth are WNL. No peripheral edema or cyanosis. No masses, redness, swelling, asymmetry, or associated skin lesions. No contractures.  Functional ROM: Unrestricted ROM                   Functional ROM: Decreased ROM for hip  and knee joints          Muscle Tone/Strength: Functionally intact. No obvious neuro-muscular anomalies detected.   Muscle Tone/Strength: Functionally intact. No obvious neuro-muscular anomalies detected.  Sensory (Neurological): Unimpaired   Sensory (Neurological): Dermatomal pain pattern and arthropathic of left knee  Palpation: No palpable anomalies   Palpation: No palpable anomalies    Assessment   Diagnosis Status  1. 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 Primary osteoarthritis of right shoulder   3. Lumbar spondylosis   4. Lumbar degenerative disc disease   5. Primary osteoarthritis of left hip   6. Cervical spondylosis without myelopathy   7. Primary osteoarthritis of left shoulder   8. Chronic pain syndrome   9. Lumbar radiculopathy     Persistent Persistent Persistent   Plan of Care   [name] has a current medication list which includes the following long-term medication(s): albuterol, allopurinol, gabapentin, hydralazine, levothyroxine, metoprolol tartrate, montelukast, omega-3 acid ethyl esters, omeprazole, potassium chloride sa, triamterene-hydrochlorothiazide, [DISCONTINUED] fluticasone, and [DISCONTINUED] mometasone.  Pharmacotherapy (Medications Ordered): Meds ordered this encounter  Medications   HYDROcodone-acetaminophen (NORCO) 7.5-325 MG tablet    Sig: Take 1 tablet by mouth every 8 (eight) hours as needed for severe pain. Must last 30 days.    Dispense:  90 tablet    Refill:  0    Chronic Pain: STOP Act (Not applicable) Fill 1 day early if closed on refill date. Avoid benzodiazepines within 8 hours of opioids   HYDROcodone-acetaminophen (NORCO) 7.5-325 MG tablet    Sig: Take 1 tablet by mouth every 8 (eight) hours as needed for severe pain. Must last 30 days.    Dispense:  90 tablet    Refill:  0    Chronic Pain: STOP Act (Not applicable) Fill 1 day early if closed on refill date. Avoid benzodiazepines within 8 hours of  opioids   HYDROcodone-acetaminophen (NORCO) 7.5-325 MG tablet    Sig: Take 1 tablet by mouth every 8 (eight) hours as needed for severe pain. Must last 30 days.    Dispense:  90 tablet    Refill:  0    Chronic Pain: STOP Act (Not applicable) Fill 1 day early if closed on refill date. Avoid benzodiazepines within 8 hours of opioids   Orders:  Orders Placed This Encounter  Procedures  Lumbar Epidural Injection    Standing Status:   Future    Standing Expiration Date:   04/02/2022    Scheduling Instructions:     Procedure: Interlaminar Lumbar Epidural Steroid injection (LESI)            Laterality: LEFT L4/5     Sedation: without     Timeframe: ASAA    Order Specific Question:   Where will this procedure be performed?    Answer:   [facility]   SUPRASCAPULAR NERVE BLOCK    For shoulder pain.    Standing Status:   Future    Standing Expiration Date:   04/02/2022    Scheduling Instructions:     Purpose: Diagnostic     Laterality: RIGHT     Level(s): Suprascapular notch     Sedation: without     Scheduling Timeframe: As permitted by the schedule    Order Specific Question:   Where will this procedure be performed?    Answer:   [facility]   Follow-up plan:   Return in about 6 weeks (around 02/10/2022) for Left L4/5 ESI + Right SSNB , in clinic [facility].    Recent Visits Date Type Provider Dept  11/05/21 Office Visit Gillis Santa, MD [facility]  Showing recent visits within past 90 days and meeting all other requirements Today's Visits Date Type Provider Dept  12/31/21 Office Visit Gillis Santa, MD [facility]  Showing today's visits and meeting all other requirements Future Appointments No visits were found meeting these conditions. Showing future appointments within next 90 days and meeting all other requirements  I discussed the assessment and treatment plan with the patient. The patient was provided an opportunity to ask questions and all  were answered. The patient agreed with the plan and demonstrated an understanding of the instructions.  Patient advised to call back or seek an in-person evaluation if the symptoms or condition worsens.  Duration of encounter: 54mnutes.  Note by: BGillis Santa MD Date: 12/31/2021; Time: 3:22 PM

## 2021-12-31 NOTE — Telephone Encounter (Signed)
Patient called, left VM to return the call to the office to schedule appt for BP FU.   Pt called saying someone called her.  She wasn't sure what they were calling about.  The office was at lunch.  Please call patient back.

## 2021-12-31 NOTE — Patient Instructions (Signed)
____________________________________________________________________________________________  General Risks and Possible Complications  Patient Responsibilities: It is important that you read this as it is part of your informed consent. It is our duty to inform you of the risks and possible complications associated with treatments offered to you. It is your responsibility as a patient to read this and to ask questions about anything that is not clear or that you believe was not covered in this document.  Patient's Rights: You have the right to refuse treatment. You also have the right to change your mind, even after initially having agreed to have the treatment done. However, under this last option, if you wait until the last second to change your mind, you may be charged for the materials used up to that point.  Introduction: Medicine is not an exact science. Everything in Medicine, including the lack of treatment(s), carries the potential for danger, harm, or loss (which is by definition: Risk). In Medicine, a complication is a secondary problem, condition, or disease that can aggravate an already existing one. All treatments carry the risk of possible complications. The fact that a side effects or complications occurs, does not imply that the treatment was conducted incorrectly. It must be clearly understood that these can happen even when everything is done following the highest safety standards.  No treatment: You can choose not to proceed with the proposed treatment alternative. The "PRO(s)" would include: avoiding the risk of complications associated with the therapy. The "CON(s)" would include: not getting any of the treatment benefits. These benefits fall under one of three categories: diagnostic; therapeutic; and/or palliative. Diagnostic benefits include: getting information which can ultimately lead to improvement of the disease or symptom(s). Therapeutic benefits are those associated with the  successful treatment of the disease. Finally, palliative benefits are those related to the decrease of the primary symptoms, without necessarily curing the condition (example: decreasing the pain from a flare-up of a chronic condition, such as incurable terminal cancer).  General Risks and Complications: These are associated to most interventional treatments. They can occur alone, or in combination. They fall under one of the following six (6) categories: no benefit or worsening of symptoms; bleeding; infection; nerve damage; allergic reactions; and/or death. No benefits or worsening of symptoms: In Medicine there are no guarantees, only probabilities. No healthcare provider can ever guarantee that a medical treatment will work, they can only state the probability that it may. Furthermore, there is always the possibility that the condition may worsen, either directly, or indirectly, as a consequence of the treatment. Bleeding: This is more common if the patient is taking a blood thinner, either prescription or over the counter (example: Goody Powders, Fish oil, Aspirin, Garlic, etc.), or if suffering a condition associated with impaired coagulation (example: Hemophilia, cirrhosis of the liver, low platelet counts, etc.). However, even if you do not have one on these, it can still happen. If you have any of these conditions, or take one of these drugs, make sure to notify your treating physician. Infection: This is more common in patients with a compromised immune system, either due to disease (example: diabetes, cancer, human immunodeficiency virus [HIV], etc.), or due to medications or treatments (example: therapies used to treat cancer and rheumatological diseases). However, even if you do not have one on these, it can still happen. If you have any of these conditions, or take one of these drugs, make sure to notify your treating physician. Nerve Damage: This is more common when the treatment is an invasive    one, but it can also happen with the use of medications, such as those used in the treatment of cancer. The damage can occur to small secondary nerves, or to large primary ones, such as those in the spinal cord and brain. This damage may be temporary or permanent and it may lead to impairments that can range from temporary numbness to permanent paralysis and/or brain death. Allergic Reactions: Any time a substance or material comes in contact with our body, there is the possibility of an allergic reaction. These can range from a mild skin rash (contact dermatitis) to a severe systemic reaction (anaphylactic reaction), which can result in death. Death: In general, any medical intervention can result in death, most of the time due to an unforeseen complication. ____________________________________________________________________________________________ Selective Nerve Root Block Patient Information  Description: Specific nerve roots exit the spinal canal and these nerves can be compressed and inflamed by a bulging disc and bone spurs.  By injecting steroids on the nerve root, we can potentially decrease the inflammation surrounding these nerves, which often leads to decreased pain.  Also, by injecting local anesthesia on the nerve root, this can provide [place] helpful information to give to your referring doctor if it decreases your pain.  Selective nerve root blocks can be done along the spine from the neck to the low back depending on the location of your pain.   After numbing the skin with local anesthesia, a small needle is passed to the nerve root and the position of the needle is verified using x-ray pictures.  After the needle is in correct position, we then deposit the medication.  You may experience a pressure sensation while this is being done.  The entire block usually lasts less than 15 minutes.  Conditions that may be treated with selective nerve root blocks: Low back and leg pain Spinal  stenosis Diagnostic block prior to potential surgery Neck and arm pain Post laminectomy syndrome  Preparation for the injection:  Do not eat any solid food or dairy products within 8 hours of your appointment. You may drink clear liquids up to 3 hours before an appointment.  Clear liquids include water, black coffee, juice or soda.  No milk or cream please. You may take your regular medications, including pain medications, with a sip of water before your appointment.  Diabetics should hold regular insulin (if taken separately) and take 1/2 normal NPH dose the morning of the procedure.  Carry some sugar containing items with you to your appointment. A driver must accompany you and be prepared to drive you home after your procedure. Bring all your current medications with you. An IV may be inserted and sedation may be given at the discretion of the physician. A blood pressure cuff, EKG, and other monitors will often be applied during the procedure.  Some patients may need to have extra oxygen administered for a short period. You will be asked to provide medical information, including allergies, prior to the procedure.  We must know immediately if you are taking blood  Thinners (like Coumadin) or if you are allergic to IV iodine contrast (dye).  Possible side-effects: All are usually temporary Bleeding from needle site Light headedness Numbness and tingling Decreased blood pressure Weakness in arms/legs Pressure sensation in back/neck Pain at injection site (several days)  Possible complications: All are extremely rare Infection Nerve injury Spinal headache (a headache wore with upright position)  Call if you experience: Fever/chills associated with headache or increased back/neck pain Headache worsened by  an upright position New onset weakness or numbness of an extremity below the injection site Hives or difficulty breathing (go to the emergency room) Inflammation or drainage at  the injection site(s) Severe back/neck pain greater than usual New symptoms which are concerning to you  Please note:  Although the local anesthetic injected can often make your back or neck feel good for several hours after the injection the pain will likely return.  It takes 3-5 days for steroids to work on the nerve root. You may not notice any pain relief for at least one week.  If effective, we will often do a series of 3 injections spaced 3-6 weeks apart to maximally decrease your pain.    If you have any questions, please call [phone] [facility]

## 2021-12-31 NOTE — Telephone Encounter (Signed)
  Chief Complaint: pt stated BP low yesterday at iron infusion pt stated 80/50 108/51 was what was charted Symptoms: c/o dizziness with standing Frequency: yesterday Pertinent Negatives: Patient denies currently dizzy Disposition: '[]'$ ED /'[]'$ Urgent Care (no appt availability in office) / '[x]'$ Appointment(In office/virtual)/ '[]'$  [facility]/ '[]'$ Home Care/ '[]'$ Refused Recommended Disposition /'[]'$ [facility]/ '[]'$  Follow-up with PCP Additional Notes: pt scheduled on day she was able to come- pt stated she has appt everyday- Pt stated that she is not going to take her BP med until she hears back from PCP.  Reason for Disposition  [6] Systolic BP [phone] AND [3] taking blood pressure medications AND [3] NOT dizzy, lightheaded or weak  Answer Assessment - Initial Assessment Questions 1. BLOOD PRESSURE: "What is the blood pressure?" "Did you take at least two measurements 5 minutes apart?"     80/50 yesterday  2. ONSET: "When did you take your blood pressure?"     yesterday 3. HOW: "How did you obtain the blood pressure?" (e.g., visiting nurse, automatic home BP monitor)     At iron infusion nurse 4. HISTORY: "Do you have a history of low blood pressure?" "What is your blood pressure normally?"     no 5. MEDICINES: "Are you taking any medications for blood pressure?" If Yes, ask: "Have they been changed recently?"     Yes-no 6. PULSE RATE: "Do you know what your pulse rate is?"      no 7. OTHER SYMPTOMS: "Have you been sick recently?" "Have you had a recent injury?"     Dizzy slight headache 8. PREGNANCY: "Is there any chance you are pregnant?" "When was your last menstrual period?"     N/a  Protocols used: Blood Pressure - Low-A-AH

## 2021-12-31 NOTE — Telephone Encounter (Signed)
There is another NT enocunter open, please review that one for information.  This encounter was created in error - please disregard.

## 2022-01-01 DIAGNOSIS — G894 Chronic pain syndrome: Secondary | ICD-10-CM | POA: Diagnosis not present

## 2022-01-01 DIAGNOSIS — T8484XD Pain due to internal orthopedic prosthetic devices, implants and grafts, subsequent encounter: Secondary | ICD-10-CM | POA: Diagnosis not present

## 2022-01-01 DIAGNOSIS — L4050 Arthropathic psoriasis, unspecified: Secondary | ICD-10-CM | POA: Diagnosis not present

## 2022-01-01 DIAGNOSIS — T84023D Instability of internal left knee prosthesis, subsequent encounter: Secondary | ICD-10-CM | POA: Diagnosis not present

## 2022-01-01 DIAGNOSIS — M069 Rheumatoid arthritis, unspecified: Secondary | ICD-10-CM | POA: Diagnosis not present

## 2022-01-01 DIAGNOSIS — M47816 Spondylosis without myelopathy or radiculopathy, lumbar region: Secondary | ICD-10-CM | POA: Diagnosis not present

## 2022-01-02 ENCOUNTER — Inpatient Hospital Stay: Payer: Medicare Other

## 2022-01-02 VITALS — BP 122/51 | HR 84 | Temp 99.5°F | Resp 16

## 2022-01-02 DIAGNOSIS — N1832 Chronic kidney disease, stage 3b: Secondary | ICD-10-CM | POA: Diagnosis not present

## 2022-01-02 DIAGNOSIS — I129 Hypertensive chronic kidney disease with stage 1 through stage 4 chronic kidney disease, or unspecified chronic kidney disease: Secondary | ICD-10-CM | POA: Diagnosis not present

## 2022-01-02 DIAGNOSIS — N183 Chronic kidney disease, stage 3 unspecified: Secondary | ICD-10-CM

## 2022-01-02 DIAGNOSIS — Z79899 Other long term (current) drug therapy: Secondary | ICD-10-CM | POA: Diagnosis not present

## 2022-01-02 DIAGNOSIS — D649 Anemia, unspecified: Secondary | ICD-10-CM

## 2022-01-02 DIAGNOSIS — D631 Anemia in chronic kidney disease: Secondary | ICD-10-CM | POA: Diagnosis not present

## 2022-01-02 MED ORDER — SODIUM CHLORIDE 0.9 % IV SOLN
200.0000 mg | Freq: Once | INTRAVENOUS | Status: AC
  Administered 2022-01-02: 200 mg via INTRAVENOUS
  Filled 2022-01-02: qty 200

## 2022-01-02 MED ORDER — SODIUM CHLORIDE 0.9 % IV SOLN
INTRAVENOUS | Status: DC
  Filled 2022-01-02: qty 250

## 2022-01-02 NOTE — Patient Instructions (Signed)

## 2022-01-03 ENCOUNTER — Encounter: Payer: Self-pay | Admitting: Family Medicine

## 2022-01-03 ENCOUNTER — Ambulatory Visit (INDEPENDENT_AMBULATORY_CARE_PROVIDER_SITE_OTHER): Payer: Medicare Other | Admitting: Family Medicine

## 2022-01-03 VITALS — BP 124/64 | HR 72 | Ht 71.0 in | Wt 248.0 lb

## 2022-01-03 DIAGNOSIS — I9589 Other hypotension: Secondary | ICD-10-CM

## 2022-01-03 DIAGNOSIS — E861 Hypovolemia: Secondary | ICD-10-CM | POA: Diagnosis not present

## 2022-01-03 NOTE — Progress Notes (Signed)
Date:  01/03/2022   Name:  Michele Moore   DOB:  02/17/1951   MRN:  [id number]   Chief Complaint: No chief complaint on file.  Hypertension This is a chronic problem. The problem has been gradually improving since onset. The problem is controlled. Pertinent negatives include no blurred vision, chest pain, headaches, palpitations or shortness of breath. Past treatments include angiotensin blockers, diuretics and beta blockers.    Lab Results  Component Value Date   NA 138 12/18/2021   K 4.0 12/18/2021   CO2 25 12/18/2021   GLUCOSE 110 (H) 12/18/2021   BUN 31 (H) 12/18/2021   CREATININE 2.28 (H) 12/18/2021   CALCIUM 10.1 12/18/2021   EGFR 21 (L) 09/16/2021   GFRNONAA 22 (L) 12/18/2021   Lab Results  Component Value Date   CHOL 198 03/19/2021   HDL 49 03/19/2021   LDLCALC 121 (H) 03/19/2021   TRIG 159 (H) 03/19/2021   CHOLHDL 4.2 10/01/2018   Lab Results  Component Value Date   TSH 0.113 (L) 11/25/2021   Lab Results  Component Value Date   HGBA1C 5.3 11/25/2021   Lab Results  Component Value Date   WBC 6.7 12/18/2021   HGB 6.0 (L) 12/18/2021   HCT 20.0 (L) 12/18/2021   MCV 91.3 12/18/2021   PLT 271 12/18/2021   Lab Results  Component Value Date   ALT 11 11/20/2021   AST 22 11/20/2021   ALKPHOS 67 11/20/2021   BILITOT 0.4 11/20/2021   No results found for: "25OHVITD2", "25OHVITD3", "VD25OH"   Review of Systems  Constitutional: Negative.  Negative for chills, fatigue, fever and unexpected weight change.  HENT:  Negative for congestion, ear discharge, ear pain, rhinorrhea, sinus pressure, sneezing and sore throat.   Eyes:  Negative for blurred vision.  Respiratory:  Negative for cough, shortness of breath, wheezing and stridor.   Cardiovascular:  Negative for chest pain and palpitations.  Gastrointestinal:  Negative for abdominal pain, blood in stool, constipation, diarrhea and nausea.  Genitourinary:  Negative for dysuria, flank pain, frequency, hematuria,  urgency and vaginal discharge.  Musculoskeletal:  Negative for arthralgias, back pain and myalgias.  Skin:  Negative for rash.  Neurological:  Negative for dizziness, weakness and headaches.  Hematological:  Negative for adenopathy. Does not bruise/bleed easily.  Psychiatric/Behavioral:  Negative for dysphoric mood. The patient is not nervous/anxious.     Patient Active Problem List   Diagnosis Date Noted   Symptomatic anemia 12/18/2021   Anemia due to stage 3b chronic kidney disease ([place]) 12/18/2021   Rheumatoid arthritis ([place]) 12/18/2021   S/P revision of total knee 10/09/2021   Chronic instability of left knee 02/12/2021   Long term methotrexate user 01/03/2021   Stage 3b chronic kidney disease ([place]) 01/03/2021   Piriformis syndrome of left side 11/15/2020   Normocytic anemia 04/22/2020   Lumbar spondylosis 02/16/2020   Arthritis of left knee 11/24/2019   Left hip pain 08/08/2019   Primary osteoarthritis of left hip 08/08/2019   Non-seasonal allergic rhinitis 04/12/2019   Bursitis of left shoulder 03/24/2019   Benign essential hypertension 03/06/2019   Proteinuria 03/06/2019   Trochanteric bursitis of left hip 10/04/2018   SI joint arthritis (left) 10/04/2018   Exertional chest pain 09/30/2018   CKD (chronic kidney disease), stage III ([place]) 09/30/2018   Psoriatic arthritis ([place]) 04/23/2018   Trigger ring finger of left hand 04/14/2018   Female pelvic pain 01/20/2018   History of revision of total knee arthroplasty 01/20/2018  Iron deficiency anemia    Heme + stool    Acute gastritis without hemorrhage    Gastric polyps    Benign neoplasm of ascending colon    Benign neoplasm of transverse colon    Polyp of sigmoid colon    Hypertension 10/01/2017   Hypothyroidism 10/01/2017   Gastroesophageal reflux disease 10/01/2017   Hypokalemia 10/01/2017   Mixed hyperlipidemia 10/01/2017   Reactive airway disease 10/01/2017   Bradycardia 08/24/2017   Severe obesity (BMI  35.0-35.9 with comorbidity) ([place]) 08/18/2017   Chronic gouty arthropathy without tophi 06/25/2017   Encounter for long-term (current) use of high-risk medication 06/25/2017   Positive ANA (antinuclear antibody) 06/17/2017   Cervical facet syndrome 06/16/2017   Primary osteoarthritis of right shoulder 06/16/2017   Sprain of anterior talofibular ligament of right ankle 06/16/2017   Lumbar radiculopathy 04/21/2017   Lumbar degenerative disc disease 04/21/2017   Chronic pain syndrome 04/21/2017   Spinal stenosis, lumbar region, with neurogenic claudication 04/21/2017   SS-A antibody positive 03/05/2017   Chronic knee pain after total replacement of left knee joint 08/03/2015   DOE (dyspnea on exertion) 12/28/2013    Allergies  Allergen Reactions   Ampicillin Swelling   Penicillins Anaphylaxis and Swelling    IgE = 10 (11/05/2017)  Has patient had a PCN reaction causing immediate rash, facial/tongue/throat swelling, SOB or lightheadedness with hypotension: Yes Has patient had a PCN reaction causing severe rash involving mucus membranes or skin necrosis: No Has patient had a PCN reaction that required hospitalization: No Has patient had a PCN reaction occurring within the last 10 years: Yes If all of the above answers are "NO", then may proceed with Cephalosporin use.   Vancomycin Rash    Other reaction(s): Other (see comments)   Vibramycin [Doxycycline Calcium] Rash    Past Surgical History:  Procedure Laterality Date   ANKLE SURGERY Right    CARPAL TUNNEL RELEASE     x3   COLONOSCOPY  2000?   COLONOSCOPY WITH PROPOFOL N/A 10/22/2017   Procedure: COLONOSCOPY WITH PROPOFOL;  Surgeon: Lucilla Lame, MD;  Location: [place];  Service: Endoscopy;  Laterality: N/A;   ESOPHAGOGASTRODUODENOSCOPY (EGD) WITH PROPOFOL N/A 10/22/2017   Procedure: ESOPHAGOGASTRODUODENOSCOPY (EGD) WITH PROPOFOL;  Surgeon: Lucilla Lame, MD;  Location: [place];  Service: Endoscopy;   Laterality: N/A;   FUSION OF TALONAVICULAR JOINT Right 08/03/2015   Procedure: TAILOR NAVICULAR JOINT FUSION - RIGHT ;  Surgeon: [name], DPM;  Location: [facility];  Service: Podiatry;  Laterality: Right;   JOINT REPLACEMENT Right    knee x 3 ,right x1 and left x2   LOOP RECORDER IMPLANT  08/22/2021   Procedure: LOOP RECORDER IMPLANT; Location: [place]; Surgeon: Virl Axe, MD   POLYPECTOMY N/A 10/22/2017   Procedure: POLYPECTOMY;  Surgeon: Lucilla Lame, MD;  Location: [place];  Service: Endoscopy;  Laterality: N/A;   ROTATOR CUFF REPAIR Right 1995   TONSILLECTOMY     TOTAL KNEE REVISION Right 01/20/2018   Procedure: POLYETHYLENE EXCHANGE;  Surgeon: Dereck Leep, MD;  Location: [facility];  Service: Orthopedics;  Laterality: Right;   TOTAL KNEE REVISION Left 10/09/2021   Procedure: LEFT TOTAL KNEE REVISION WITH POLYETHYLENE EXCHANGE.;  Surgeon: Dereck Leep, MD;  Location: [facility];  Service: Orthopedics;  Laterality: Left;   TUBAL LIGATION     UPPER GI ENDOSCOPY  2000?    Social History   Tobacco Use   Smoking status: Former    Packs/day: 2.00  Years: 20.00    Total pack years: 40.00    Types: Cigarettes    Quit date: 02/24/1993    Years since quitting: 28.8   Smokeless tobacco: Never  Vaping Use   Vaping Use: Never used  Substance Use Topics   Alcohol use: No    Alcohol/week: 0.0 standard drinks of alcohol   Drug use: No     Medication list has been reviewed and updated.  Current Meds  Medication Sig   acetaminophen (TYLENOL) 650 MG CR tablet Take 650 mg by mouth every 8 (eight) hours as needed for pain.   albuterol (VENTOLIN HFA) 108 (90 Base) MCG/ACT inhaler Inhale 2 puffs into the lungs every 6 (six) hours as needed for wheezing or shortness of breath.   allopurinol (ZYLOPRIM) 100 MG tablet Take 200 mg by mouth every morning.    betamethasone dipropionate 0.05 % cream APPLY TO PSORIASIS ON HANDS TWICE DAILY ONLY. AVOID FACE, GROIN AND AXILLA    betamethasone valerate (VALISONE) 0.1 % cream Apply topically 2 (two) times daily as needed (Rash).   calcipotriene (DOVONOX) 0.005 % cream APPLY TOPICALLY TO PSORIASIS AREAS ON LEGS AND FEET ONCE OR TWICE DAILY   Cholecalciferol (VITAMIN D3) 2000 units TABS Take 2,000 Units by mouth daily.   clobetasol ointment (TEMOVATE) 0.05 % Apply topically 2 (two) times daily.   diclofenac sodium (VOLTAREN) 1 % GEL Apply 2 g topically 3 (three) times daily.    FARXIGA 10 MG TABS tablet Take 10 mg by mouth every morning.   furosemide (LASIX) 40 MG tablet Take 40 mg by mouth daily.   gabapentin (NEURONTIN) 600 MG tablet Take 1 tablet (600 mg total) by mouth at bedtime.   hydrALAZINE (APRESOLINE) 50 MG tablet Take 1 tablet by mouth twice daily   [START ON 01/22/2022] HYDROcodone-acetaminophen (NORCO) 7.5-325 MG tablet Take 1 tablet by mouth every 8 (eight) hours as needed for severe pain. Must last 30 days.   [START ON 02/21/2022] HYDROcodone-acetaminophen (NORCO) 7.5-325 MG tablet Take 1 tablet by mouth every 8 (eight) hours as needed for severe pain. Must last 30 days.   [START ON 03/23/2022] HYDROcodone-acetaminophen (NORCO) 7.5-325 MG tablet Take 1 tablet by mouth every 8 (eight) hours as needed for severe pain. Must last 30 days.   ketoconazole (NIZORAL) 2 % cream Apply topically daily as needed.   levothyroxine (SYNTHROID) 150 MCG tablet Take 1 tablet (150 mcg total) by mouth daily.   losartan (COZAAR) 25 MG tablet Take 25 mg by mouth daily.   meclizine (ANTIVERT) 12.5 MG tablet TAKE 1 TABLET(12.5 MG) BY MOUTH THREE TIMES DAILY AS NEEDED FOR DIZZINESS   metoprolol tartrate (LOPRESSOR) 25 MG tablet Take 1 tablet (25 mg total) by mouth 2 (two) times daily.   montelukast (SINGULAIR) 10 MG tablet TAKE 1 TABLET(10 MG) BY MOUTH AT BEDTIME   Multiple Vitamins-Calcium (ONE-A-DAY WOMENS FORMULA PO) Take 1 tablet by mouth daily.   mupirocin ointment (BACTROBAN) 2 % Apply 1 application topically 2 (two) times daily.    nystatin cream (MYCOSTATIN) Apply 1 Application topically 2 (two) times daily.   omega-3 acid ethyl esters (LOVAZA) 1 g capsule Take 1 capsule (1 g total) by mouth 2 (two) times daily.   omeprazole (PRILOSEC) 40 MG capsule TAKE 1 CAPSULE(40 MG) BY MOUTH DAILY   OTEZLA 30 MG TABS Take 1 tablet by mouth daily.   Polyethyl Glycol-Propyl Glycol (SYSTANE OP) Place 1 drop into both eyes daily as needed (dry eyes).   potassium chloride SA (KLOR-CON  M) 20 MEQ tablet Take 1 tablet (20 mEq total) by mouth daily.   tiZANidine (ZANAFLEX) 4 MG tablet Take 1 tablet (4 mg total) by mouth at bedtime.   traMADol (ULTRAM) 50 MG tablet Take 1-2 tablets (50-100 mg total) by mouth every 4 (four) hours as needed for moderate pain.   triamcinolone (KENALOG) 0.1 % Apply 1 application topically 2 (two) times daily.   triamterene-hydrochlorothiazide (MAXZIDE) 75-50 MG tablet Take 1 tablet by mouth daily.       11/25/2021    2:52 PM 09/16/2021    1:43 PM 06/12/2020   10:20 AM 12/08/2019   11:17 AM  GAD 7 : Generalized Anxiety Score  Nervous, Anxious, on Edge 0 0 0 0  Control/stop worrying 0 0 0 0  Worry too much - different things 0 0 0 0  Trouble relaxing 0 3 0 0  Restless 0 0 0 0  Easily annoyed or irritable 0 0 0 0  Afraid - awful might happen 0 0 0 0  Total GAD 7 Score 0 3 0 0  Anxiety Difficulty  Not difficult at all         11/25/2021    2:52 PM 09/16/2021    1:42 PM 05/14/2021   11:31 AM  Depression screen PHQ 2/9  Decreased Interest 0 0 0  Down, Depressed, Hopeless 0 0 0  PHQ - 2 Score 0 0 0  Altered sleeping 0 3   Tired, decreased energy 0 3   Change in appetite 0 0   Feeling bad or failure about yourself  0 0   Trouble concentrating 0 0   Moving slowly or fidgety/restless 0 0   Suicidal thoughts 0 0   PHQ-9 Score 0 6   Difficult doing work/chores Not difficult at all Extremely dIfficult     BP Readings from Last 3 Encounters:  01/03/22 124/64  01/02/22 (!) 122/51  12/31/21 133/61     Physical Exam Vitals and nursing note reviewed. Exam conducted with a chaperone present.  Constitutional:      General: She is not in acute distress.    Appearance: She is not diaphoretic.  HENT:     Head: Normocephalic and atraumatic.     Right Ear: Tympanic membrane and external ear normal.     Left Ear: Tympanic membrane and external ear normal.     Nose: Nose normal. No congestion.  Eyes:     General:        Right eye: No discharge.        Left eye: No discharge.     Conjunctiva/sclera: Conjunctivae normal.     Pupils: Pupils are equal, round, and reactive to light.  Neck:     Thyroid: No thyromegaly.     Vascular: No JVD.  Cardiovascular:     Rate and Rhythm: Normal rate and regular rhythm.     Heart sounds: Normal heart sounds. No murmur heard.    No friction rub. No gallop.  Pulmonary:     Effort: Pulmonary effort is normal.     Breath sounds: Normal breath sounds. No wheezing, rhonchi or rales.  Abdominal:     General: Bowel sounds are normal.     Palpations: Abdomen is soft. There is no mass.     Tenderness: There is no abdominal tenderness. There is no guarding.  Musculoskeletal:        General: Normal range of motion.     Cervical back: Normal range of motion and neck supple.  Lymphadenopathy:     Cervical: No cervical adenopathy.  Skin:    General: Skin is warm and dry.  Neurological:     Mental Status: She is alert.     Deep Tendon Reflexes: Reflexes are normal and symmetric.     Wt Readings from Last 3 Encounters:  01/03/22 248 lb (112.5 kg)  12/31/21 251 lb (113.9 kg)  12/18/21 254 lb (115.2 kg)    BP 124/64   Pulse 72   Ht _0  (1.803 m)   Wt 248 lb (112.5 kg)   SpO2 98%   BMI 34.59 kg/m   Assessment and Plan:  1. Hypotension due to hypovolemia Patient is undergoing infusions of iron due to poor hematopoiesis due to inability to maintain adequate iron intake.  Patient's also has had to have transfusions in the most recent past.  As  noted Dr. Zollie Scale has her on Farxiga and furosemide and I have the patient on on hydralazine triamterene losartan and metoprolol.  For cardiac reasons I am maintaining the losartan and metoprolol and discontinuing the Dyazide and hydralazine.  We will continue the Farxiga and furosemide per nephrology and will have patient return in 1 week and recheck blood pressure.  If patient suddenly gets dizzy again she may have to go to hospital setting for further evaluation in case there is an anemic component to the hypotension.   Otilio Miu, MD

## 2022-01-06 ENCOUNTER — Ambulatory Visit (INDEPENDENT_AMBULATORY_CARE_PROVIDER_SITE_OTHER): Payer: Medicare Other

## 2022-01-06 DIAGNOSIS — R002 Palpitations: Secondary | ICD-10-CM | POA: Diagnosis not present

## 2022-01-06 DIAGNOSIS — I10 Essential (primary) hypertension: Secondary | ICD-10-CM | POA: Diagnosis not present

## 2022-01-06 DIAGNOSIS — N2581 Secondary hyperparathyroidism of renal origin: Secondary | ICD-10-CM | POA: Diagnosis not present

## 2022-01-06 DIAGNOSIS — N184 Chronic kidney disease, stage 4 (severe): Secondary | ICD-10-CM | POA: Diagnosis not present

## 2022-01-06 DIAGNOSIS — D631 Anemia in chronic kidney disease: Secondary | ICD-10-CM | POA: Diagnosis not present

## 2022-01-07 LAB — CUP PACEART REMOTE DEVICE CHECK
Date Time Interrogation Session: 20231112231507
Implantable Pulse Generator Implant Date: 20230629

## 2022-01-08 ENCOUNTER — Emergency Department
Admission: EM | Admit: 2022-01-08 | Discharge: 2022-01-09 | Disposition: A | Discharge status: Home / Self Care | Payer: Medicare Other | Attending: Emergency Medicine | Admitting: Emergency Medicine

## 2022-01-08 ENCOUNTER — Other Ambulatory Visit: Payer: Self-pay

## 2022-01-08 DIAGNOSIS — D649 Anemia, unspecified: Secondary | ICD-10-CM | POA: Insufficient documentation

## 2022-01-08 DIAGNOSIS — N189 Chronic kidney disease, unspecified: Secondary | ICD-10-CM | POA: Diagnosis not present

## 2022-01-08 DIAGNOSIS — I129 Hypertensive chronic kidney disease with stage 1 through stage 4 chronic kidney disease, or unspecified chronic kidney disease: Secondary | ICD-10-CM | POA: Insufficient documentation

## 2022-01-08 DIAGNOSIS — R531 Weakness: Secondary | ICD-10-CM | POA: Diagnosis present

## 2022-01-08 LAB — URINALYSIS, ROUTINE W REFLEX MICROSCOPIC
Bilirubin Urine: NEGATIVE
Glucose, UA: NEGATIVE mg/dL
Hgb urine dipstick: NEGATIVE
Ketones, ur: NEGATIVE mg/dL
Leukocytes,Ua: NEGATIVE
Nitrite: POSITIVE — AB
Protein, ur: NEGATIVE mg/dL
Specific Gravity, Urine: 1.009 (ref 1.005–1.030)
pH: 5 (ref 5.0–8.0)

## 2022-01-08 LAB — COMPREHENSIVE METABOLIC PANEL
ALT: 10 U/L (ref 0–44)
AST: 21 U/L (ref 15–41)
Albumin: 3.5 g/dL (ref 3.5–5.0)
Alkaline Phosphatase: 53 U/L (ref 38–126)
Anion gap: 11 (ref 5–15)
BUN: 35 mg/dL — ABNORMAL HIGH (ref 8–23)
CO2: 25 mmol/L (ref 22–32)
Calcium: 9.8 mg/dL (ref 8.9–10.3)
Chloride: 102 mmol/L (ref 98–111)
Creatinine, Ser: 2.07 mg/dL — ABNORMAL HIGH (ref 0.44–1.00)
GFR, Estimated: 25 mL/min — ABNORMAL LOW (ref >60)
Glucose, Bld: 103 mg/dL — ABNORMAL HIGH (ref 70–99)
Potassium: 3.9 mmol/L (ref 3.5–5.1)
Sodium: 138 mmol/L (ref 135–145)
Total Bilirubin: 0.4 mg/dL (ref 0.3–1.2)
Total Protein: 7.4 g/dL (ref 6.5–8.1)

## 2022-01-08 LAB — CBC WITH DIFFERENTIAL/PLATELET
Abs Immature Granulocytes: 0.02 10*3/uL (ref 0.00–0.07)
Basophils Absolute: 0.1 10*3/uL (ref 0.0–0.1)
Basophils Relative: 1 %
Eosinophils Absolute: 0.1 10*3/uL (ref 0.0–0.5)
Eosinophils Relative: 1 %
HCT: 19.6 % — ABNORMAL LOW (ref 36.0–46.0)
Hemoglobin: 6 g/dL — ABNORMAL LOW (ref 12.0–15.0)
Immature Granulocytes: 0 %
Lymphocytes Relative: 13 %
Lymphs Abs: 1.1 10*3/uL (ref 0.7–4.0)
MCH: 26.1 pg (ref 26.0–34.0)
MCHC: 30.6 g/dL (ref 30.0–36.0)
MCV: 85.2 fL (ref 80.0–100.0)
Monocytes Absolute: 0.5 10*3/uL (ref 0.1–1.0)
Monocytes Relative: 6 %
Neutro Abs: 7 10*3/uL (ref 1.7–7.7)
Neutrophils Relative %: 79 %
Platelets: 313 10*3/uL (ref 150–400)
RBC: 2.3 MIL/uL — ABNORMAL LOW (ref 3.87–5.11)
RDW: 18.1 % — ABNORMAL HIGH (ref 11.5–15.5)
WBC: 8.8 10*3/uL (ref 4.0–10.5)
nRBC: 0 % (ref 0.0–0.2)

## 2022-01-08 MED ORDER — SODIUM CHLORIDE 0.9 % IV SOLN: 10.0000 mL/h | Freq: Once | INTRAVENOUS | Status: DC

## 2022-01-08 MED ORDER — HYDROCODONE-ACETAMINOPHEN 7.5-325 MG PO TABS
1.0000 | ORAL_TABLET | Freq: Once | ORAL | Status: AC
  Administered 2022-01-08: 1 via ORAL
  Filled 2022-01-08: qty 1

## 2022-01-08 NOTE — ED Notes (Signed)
Pharmacist made RN aware that pt had positive antibody screen on type and screen and blood delivery will be delayed. MD made aware.

## 2022-01-08 NOTE — ED Provider Notes (Signed)
[facility] Provider Note    Event Date/Time   First MD Initiated Contact with Patient 01/08/22 1558     (approximate)   History   Chief Complaint Abnormal Lab   HPI  Michele Moore is a 71 y.o. female with past medical history of hypertension, hyperlipidemia, CKD, anemia of chronic disease, rheumatoid arthritis, and chronic pain syndrome who presents to the ED complaining of abnormal labs.  Patient reports that she had a routine follow-up visit with her nephrologist yesterday, received a call earlier this morning that her hemoglobin was low and she needed a blood transfusion.  She denies any recent bleeding, does state that she has been feeling increasingly dizzy and weak over the past 7 days.  She has not had any fevers, cough, chest pain, or shortness of breath.  She was also told that her renal function was worse on her labs.  Patient reports some decreased urine output lately, denies any dysuria or hematuria.  She has been eating and drinking normally, denies any nausea or vomiting.     Physical Exam   Triage Vital Signs: ED Triage Vitals  Enc Vitals Group     BP 01/08/22 1532 (!) 129/56     Pulse Rate 01/08/22 1532 99     Resp 01/08/22 1532 16     Temp 01/08/22 1532 97.8 F (36.6 C)     Temp Source 01/08/22 1532 Oral     SpO2 01/08/22 1532 98 %     Weight 01/08/22 1530 247 lb 12.8 oz (112.4 kg)     Height 01/08/22 1530 '5\' 11"'$  (1.803 m)     Head Circumference --      Peak Flow --      Pain Score 01/08/22 1530 0     Pain Loc --      Pain Edu? --      Excl. in [place]? --     Most recent vital signs: Vitals:   01/08/22 2215 01/08/22 2230  BP: (!) 121/59 106/73  Pulse: (!) 56 98  Resp: 14 16  Temp:    SpO2: 100% 100%    Constitutional: Alert and oriented. Eyes: Conjunctivae are normal. Head: Atraumatic. Nose: No congestion/rhinnorhea. Mouth/Throat: Mucous membranes are moist.  Cardiovascular: Normal rate, regular rhythm. Grossly normal  heart sounds.  2+ radial pulses bilaterally. Respiratory: Normal respiratory effort.  No retractions. Lungs CTAB. Gastrointestinal: Soft and nontender. No distention. Musculoskeletal: No lower extremity tenderness nor edema.  Neurologic:  Normal speech and language. No gross focal neurologic deficits are appreciated.    ED Results / Procedures / Treatments   Labs (all labs ordered are listed, but only abnormal results are displayed) Labs Reviewed  CBC WITH DIFFERENTIAL/PLATELET - Abnormal; Notable for the following components:      Result Value   RBC 2.30 (*)    Hemoglobin 6.0 (*)    HCT 19.6 (*)    RDW 18.1 (*)    All other components within normal limits  COMPREHENSIVE METABOLIC PANEL - Abnormal; Notable for the following components:   Glucose, Bld 103 (*)    BUN 35 (*)    Creatinine, Ser 2.07 (*)    GFR, Estimated 25 (*)    All other components within normal limits  URINALYSIS, ROUTINE W REFLEX MICROSCOPIC - Abnormal; Notable for the following components:   Color, Urine YELLOW (*)    APPearance HAZY (*)    Nitrite POSITIVE (*)    Bacteria, UA FEW (*)  All other components within normal limits  TYPE AND SCREEN  PREPARE RBC (CROSSMATCH)    PROCEDURES:  Critical Care performed: Yes, see critical care procedure note(s)  .Critical Care  Performed by: Blake Divine, MD Authorized by: Blake Divine, MD   Critical care provider statement:    Critical care time (minutes):  30   Critical care time was exclusive of:  Separately billable procedures and treating other patients and teaching time   Critical care was necessary to treat or prevent imminent or life-threatening deterioration of the following conditions:  Circulatory failure   Critical care was time spent personally by me on the following activities:  Development of treatment plan with patient or surrogate, discussions with consultants, evaluation of patient's response to treatment, examination of patient,  ordering and review of laboratory studies, ordering and review of radiographic studies, ordering and performing treatments and interventions, pulse oximetry, re-evaluation of patient's condition and review of old charts   I assumed direction of critical care for this patient from another provider in my specialty: no      MEDICATIONS ORDERED IN ED: Medications  0.9 %  sodium chloride infusion (0 mL/hr Intravenous Hold 01/08/22 1742)  HYDROcodone-acetaminophen ([place]) 7.5-325 MG per tablet 1 tablet (1 tablet Oral Given 01/08/22 1727)     IMPRESSION / MDM / [place] / ED COURSE  I reviewed the triage vital signs and the nursing notes.                              71 y.o. female with past medical history of hypertension, hyperlipidemia, CKD, anemia of chronic disease, remote arthritis, and chronic pain syndrome who presents to the ED complaining of increasing dizziness and weakness over the past couple of days, was told this morning that her hemoglobin was low.  Patient's presentation is most consistent with acute presentation with potential threat to life or bodily function.  Differential diagnosis includes, but is not limited to, anemia of chronic disease, iron deficiency, GI bleeding, AKI, electrolyte abnormality, UTI.  Patient nontoxic-appearing and in no acute distress, vital signs are unremarkable.  She denies any recent bleeding, specifically has not noticed any blood in her stool or dark tarry stool.  Hemoglobin noted to be 6 here in the ED and given her symptoms, she would benefit from transfusion of 1 unit PRBCs.  Patient without significant leukocytosis or thrombocytopenia, has had similar issues with anemia in the past, including last month when she needed to receive transfusion here in the ED.  Remainder of labs show stable renal function compared to previous, no significant electrolyte abnormality noted.  No findings to suggest acute bleeding at this time and patient would  be appropriate for close outpatient follow-up following transfusion.  She is already taking supplemental iron, follows regularly with hematology for iron transfusions.  Patient turned over to oncoming provider pending completion of blood transfusion, after which patient would be appropriate for discharge home with close hematology follow-up.      FINAL CLINICAL IMPRESSION(S) / ED DIAGNOSES   Final diagnoses:  Symptomatic anemia     Rx / DC Orders   ED Discharge Orders     None        Note:  This document was prepared using Dragon voice recognition software and may include unintentional dictation errors.   Blake Divine, MD 01/08/22 [phone]

## 2022-01-08 NOTE — ED Triage Notes (Signed)
Sent from PCP for blood transfusion.  States HGB 5.  Also states kidneys and 'dropping'.  AAOx3.  Skin warm and dry. NAD

## 2022-01-08 NOTE — ED Notes (Signed)
Rate of infusion increased to 221m/hr.  No s/s of reaction, distress or discomfort.

## 2022-01-08 NOTE — Discharge Instructions (Addendum)
Return to the ER for worsening symptoms, persistent vomiting, difficulty breathing or other concerns. °

## 2022-01-09 ENCOUNTER — Telehealth: Payer: Self-pay

## 2022-01-09 NOTE — ED Provider Notes (Signed)
-----------------------------------------   12:52 AM on 01/09/2022 -----------------------------------------   Transfusion complete. Strict return precautions given.  Patient verbalizes understanding and agrees with plan of care.   Paulette Blanch, MD 01/09/22 [phone]

## 2022-01-09 NOTE — Telephone Encounter (Signed)
Transition Care Management Follow-up Telephone Call Date of discharge and from where: 01/09/2022 ER [facility] How have you been since you were released from the hospital? Feel better since "blood transfusion" Any questions or concerns? No  Items Reviewed: Did the pt receive and understand the discharge instructions provided? Yes  Medications obtained and verified? Yes  Other? No  Any new allergies since your discharge? No  Dietary orders reviewed? Yes Do you have support at home? Yes   Home Care and Equipment/Supplies:no   Functional Questionnaire: (I = Independent and D = Dependent) ADLs: I  Bathing/Dressing- I  Meal Prep- I  Eating- I  Maintaining continence- I  Transferring/Ambulation- I  Managing Meds- I  Follow up appointments reviewed:  PCP Hospital f/u appt confirmed? Yes  Scheduled to see Dr Otilio Miu on 01/11/19 @ 220. [facility] f/u appt confirmed?  I am sending a message to Dr Tasia Catchings to see if she wants to see her sooner thanDec 5   Are transportation arrangements needed? No  If their condition worsens, is the pt aware to call PCP or go to the Emergency Dept.? Yes Was the patient provided with contact information for the PCP's office or ED? Yes Was to pt encouraged to call back with questions or concerns? Yes

## 2022-01-09 NOTE — ED Notes (Signed)
Dc instructions given verbally and in writing, understanding voiced, signature obtained.  Pt left in stable condition.

## 2022-01-10 ENCOUNTER — Ambulatory Visit: Payer: Medicare Other | Admitting: Family Medicine

## 2022-01-10 ENCOUNTER — Inpatient Hospital Stay: Payer: Medicare Other

## 2022-01-10 ENCOUNTER — Other Ambulatory Visit: Payer: Self-pay

## 2022-01-10 VITALS — BP 131/75 | HR 67 | Temp 98.2°F | Resp 18

## 2022-01-10 DIAGNOSIS — I129 Hypertensive chronic kidney disease with stage 1 through stage 4 chronic kidney disease, or unspecified chronic kidney disease: Secondary | ICD-10-CM | POA: Diagnosis not present

## 2022-01-10 DIAGNOSIS — N183 Chronic kidney disease, stage 3 unspecified: Secondary | ICD-10-CM

## 2022-01-10 DIAGNOSIS — D631 Anemia in chronic kidney disease: Secondary | ICD-10-CM | POA: Diagnosis not present

## 2022-01-10 DIAGNOSIS — Z79899 Other long term (current) drug therapy: Secondary | ICD-10-CM | POA: Diagnosis not present

## 2022-01-10 DIAGNOSIS — D649 Anemia, unspecified: Secondary | ICD-10-CM

## 2022-01-10 DIAGNOSIS — N1832 Chronic kidney disease, stage 3b: Secondary | ICD-10-CM | POA: Diagnosis not present

## 2022-01-10 LAB — CBC WITH DIFFERENTIAL/PLATELET
Abs Immature Granulocytes: 0.03 10*3/uL (ref 0.00–0.07)
Basophils Absolute: 0 10*3/uL (ref 0.0–0.1)
Basophils Relative: 1 %
Eosinophils Absolute: 0.1 10*3/uL (ref 0.0–0.5)
Eosinophils Relative: 2 %
HCT: 22.9 % — ABNORMAL LOW (ref 36.0–46.0)
Hemoglobin: 7 g/dL — ABNORMAL LOW (ref 12.0–15.0)
Immature Granulocytes: 1 %
Lymphocytes Relative: 15 %
Lymphs Abs: 1 10*3/uL (ref 0.7–4.0)
MCH: 26.8 pg (ref 26.0–34.0)
MCHC: 30.6 g/dL (ref 30.0–36.0)
MCV: 87.7 fL (ref 80.0–100.0)
Monocytes Absolute: 0.5 10*3/uL (ref 0.1–1.0)
Monocytes Relative: 7 %
Neutro Abs: 5 10*3/uL (ref 1.7–7.7)
Neutrophils Relative %: 74 %
Platelets: 274 10*3/uL (ref 150–400)
RBC: 2.61 MIL/uL — ABNORMAL LOW (ref 3.87–5.11)
RDW: 18 % — ABNORMAL HIGH (ref 11.5–15.5)
WBC: 6.6 10*3/uL (ref 4.0–10.5)
nRBC: 0 % (ref 0.0–0.2)

## 2022-01-10 LAB — TYPE AND SCREEN
ABO/RH(D): O POS
Antibody Screen: POSITIVE
Unit division: 0
Unit division: 0
Unit division: 0

## 2022-01-10 LAB — BPAM RBC
Blood Product Expiration Date: 202312152359
Blood Product Expiration Date: 202312192359
Blood Product Expiration Date: 202312192359
ISSUE DATE / TIME: 202311152145
Unit Type and Rh: 5100
Unit Type and Rh: 5100
Unit Type and Rh: 5100

## 2022-01-10 LAB — RETIC PANEL
Immature Retic Fract: 29.6 % — ABNORMAL HIGH (ref 2.3–15.9)
RBC.: 2.58 MIL/uL — ABNORMAL LOW (ref 3.87–5.11)
Retic Count, Absolute: 68.6 10*3/uL (ref 19.0–186.0)
Retic Ct Pct: 2.7 % (ref 0.4–3.1)
Reticulocyte Hemoglobin: 23.9 pg — ABNORMAL LOW (ref >27.9)

## 2022-01-10 LAB — FERRITIN: Ferritin: 189 ng/mL (ref 11–307)

## 2022-01-10 LAB — PREPARE RBC (CROSSMATCH)

## 2022-01-10 LAB — IRON AND TIBC
Iron: 27 ug/dL — ABNORMAL LOW (ref 28–170)
Saturation Ratios: 11 % (ref 10.4–31.8)
TIBC: 252 ug/dL (ref 250–450)
UIBC: 225 ug/dL

## 2022-01-10 MED ORDER — EPOETIN ALFA-EPBX 20000 UNIT/ML IJ SOLN
20000.0000 [IU] | Freq: Once | INTRAMUSCULAR | Status: AC
  Administered 2022-01-10: 20000 [IU] via SUBCUTANEOUS
  Filled 2022-01-10: qty 1

## 2022-01-13 ENCOUNTER — Other Ambulatory Visit: Payer: Self-pay | Admitting: Oncology

## 2022-01-13 ENCOUNTER — Other Ambulatory Visit: Payer: Self-pay

## 2022-01-13 ENCOUNTER — Telehealth: Payer: Self-pay

## 2022-01-13 ENCOUNTER — Inpatient Hospital Stay: Payer: Medicare Other

## 2022-01-13 DIAGNOSIS — D649 Anemia, unspecified: Secondary | ICD-10-CM

## 2022-01-13 DIAGNOSIS — D631 Anemia in chronic kidney disease: Secondary | ICD-10-CM

## 2022-01-13 DIAGNOSIS — N183 Chronic kidney disease, stage 3 unspecified: Secondary | ICD-10-CM

## 2022-01-13 DIAGNOSIS — N1832 Chronic kidney disease, stage 3b: Secondary | ICD-10-CM | POA: Diagnosis not present

## 2022-01-13 DIAGNOSIS — Z79899 Other long term (current) drug therapy: Secondary | ICD-10-CM | POA: Diagnosis not present

## 2022-01-13 DIAGNOSIS — I129 Hypertensive chronic kidney disease with stage 1 through stage 4 chronic kidney disease, or unspecified chronic kidney disease: Secondary | ICD-10-CM | POA: Diagnosis not present

## 2022-01-13 LAB — TECHNOLOGIST SMEAR REVIEW
Plt Morphology: NORMAL
RBC MORPHOLOGY: NORMAL
WBC MORPHOLOGY: NORMAL

## 2022-01-13 LAB — CBC WITH DIFFERENTIAL/PLATELET
Abs Immature Granulocytes: 0.02 10*3/uL (ref 0.00–0.07)
Basophils Absolute: 0 10*3/uL (ref 0.0–0.1)
Basophils Relative: 1 %
Eosinophils Absolute: 0.1 10*3/uL (ref 0.0–0.5)
Eosinophils Relative: 2 %
HCT: 22.7 % — ABNORMAL LOW (ref 36.0–46.0)
Hemoglobin: 6.9 g/dL — ABNORMAL LOW (ref 12.0–15.0)
Immature Granulocytes: 0 %
Lymphocytes Relative: 12 %
Lymphs Abs: 0.9 10*3/uL (ref 0.7–4.0)
MCH: 26.6 pg (ref 26.0–34.0)
MCHC: 30.4 g/dL (ref 30.0–36.0)
MCV: 87.6 fL (ref 80.0–100.0)
Monocytes Absolute: 0.4 10*3/uL (ref 0.1–1.0)
Monocytes Relative: 6 %
Neutro Abs: 5.7 10*3/uL (ref 1.7–7.7)
Neutrophils Relative %: 79 %
Platelets: 261 10*3/uL (ref 150–400)
RBC: 2.59 MIL/uL — ABNORMAL LOW (ref 3.87–5.11)
RDW: 17.6 % — ABNORMAL HIGH (ref 11.5–15.5)
WBC: 7.2 10*3/uL (ref 4.0–10.5)
nRBC: 0 % (ref 0.0–0.2)

## 2022-01-13 LAB — IRON AND TIBC
Iron: 35 ug/dL (ref 28–170)
Saturation Ratios: 15 % (ref 10.4–31.8)
TIBC: 231 ug/dL — ABNORMAL LOW (ref 250–450)
UIBC: 196 ug/dL

## 2022-01-13 LAB — FERRITIN: Ferritin: 134 ng/mL (ref 11–307)

## 2022-01-13 LAB — PREPARE RBC (CROSSMATCH)

## 2022-01-13 NOTE — Telephone Encounter (Signed)
Called pt x2 to inform her of updated blood transfusion time, per her request. No answer but left detailed VM.

## 2022-01-13 NOTE — Telephone Encounter (Signed)
Called pt to inform her that she will need blood transufusion tomorrow and Retacrit on Wednesday.   Please add retacrit inj to 11/28 poss blood on 11/29.  add possible PRBC to 126  Pt will pick up updated appts at tomorrow's visit.

## 2022-01-14 ENCOUNTER — Inpatient Hospital Stay: Payer: Medicare Other

## 2022-01-14 DIAGNOSIS — D631 Anemia in chronic kidney disease: Secondary | ICD-10-CM | POA: Diagnosis not present

## 2022-01-14 DIAGNOSIS — I129 Hypertensive chronic kidney disease with stage 1 through stage 4 chronic kidney disease, or unspecified chronic kidney disease: Secondary | ICD-10-CM | POA: Diagnosis not present

## 2022-01-14 DIAGNOSIS — Z79899 Other long term (current) drug therapy: Secondary | ICD-10-CM | POA: Diagnosis not present

## 2022-01-14 DIAGNOSIS — D649 Anemia, unspecified: Secondary | ICD-10-CM

## 2022-01-14 DIAGNOSIS — N1832 Chronic kidney disease, stage 3b: Secondary | ICD-10-CM | POA: Diagnosis not present

## 2022-01-14 MED ORDER — SODIUM CHLORIDE 0.9% IV SOLUTION
250.0000 mL | Freq: Once | INTRAVENOUS | Status: AC
  Administered 2022-01-14: 250 mL via INTRAVENOUS
  Filled 2022-01-14: qty 250

## 2022-01-14 MED ORDER — DIPHENHYDRAMINE HCL 25 MG PO CAPS
25.0000 mg | ORAL_CAPSULE | Freq: Once | ORAL | Status: AC
  Administered 2022-01-14: 25 mg via ORAL
  Filled 2022-01-14: qty 1

## 2022-01-14 MED ORDER — ACETAMINOPHEN 325 MG PO TABS
650.0000 mg | ORAL_TABLET | Freq: Once | ORAL | Status: AC
  Administered 2022-01-14: 650 mg via ORAL
  Filled 2022-01-14: qty 2

## 2022-01-15 ENCOUNTER — Other Ambulatory Visit: Payer: Self-pay | Admitting: Oncology

## 2022-01-15 ENCOUNTER — Inpatient Hospital Stay: Payer: Medicare Other

## 2022-01-15 VITALS — BP 163/83 | HR 71

## 2022-01-15 DIAGNOSIS — D631 Anemia in chronic kidney disease: Secondary | ICD-10-CM | POA: Diagnosis not present

## 2022-01-15 DIAGNOSIS — N183 Chronic kidney disease, stage 3 unspecified: Secondary | ICD-10-CM

## 2022-01-15 DIAGNOSIS — I129 Hypertensive chronic kidney disease with stage 1 through stage 4 chronic kidney disease, or unspecified chronic kidney disease: Secondary | ICD-10-CM | POA: Diagnosis not present

## 2022-01-15 DIAGNOSIS — Z79899 Other long term (current) drug therapy: Secondary | ICD-10-CM | POA: Diagnosis not present

## 2022-01-15 DIAGNOSIS — N1832 Chronic kidney disease, stage 3b: Secondary | ICD-10-CM | POA: Diagnosis not present

## 2022-01-15 DIAGNOSIS — D649 Anemia, unspecified: Secondary | ICD-10-CM

## 2022-01-15 LAB — BPAM RBC
Blood Product Expiration Date: 202311222359
ISSUE DATE / TIME: 202311211020
Unit Type and Rh: 9500

## 2022-01-15 LAB — TYPE AND SCREEN
ABO/RH(D): O POS
Antibody Screen: POSITIVE
Unit division: 0

## 2022-01-15 MED ORDER — EPOETIN ALFA-EPBX 20000 UNIT/ML IJ SOLN
20000.0000 [IU] | Freq: Once | INTRAMUSCULAR | Status: AC
  Administered 2022-01-15: 20000 [IU] via SUBCUTANEOUS
  Filled 2022-01-15: qty 1

## 2022-01-21 ENCOUNTER — Other Ambulatory Visit: Payer: Medicare Other

## 2022-01-21 ENCOUNTER — Inpatient Hospital Stay: Payer: Medicare Other

## 2022-01-22 ENCOUNTER — Other Ambulatory Visit: Payer: Self-pay

## 2022-01-22 DIAGNOSIS — E039 Hypothyroidism, unspecified: Secondary | ICD-10-CM

## 2022-01-22 MED ORDER — LEVOTHYROXINE SODIUM 150 MCG PO TABS: 150.0000 ug | ORAL_TABLET | Freq: Every day | ORAL | 0 refills | Status: DC

## 2022-01-24 ENCOUNTER — Other Ambulatory Visit: Payer: Self-pay

## 2022-01-24 ENCOUNTER — Inpatient Hospital Stay: Payer: Medicare Other | Attending: Oncology

## 2022-01-24 DIAGNOSIS — D631 Anemia in chronic kidney disease: Secondary | ICD-10-CM | POA: Diagnosis not present

## 2022-01-24 DIAGNOSIS — I129 Hypertensive chronic kidney disease with stage 1 through stage 4 chronic kidney disease, or unspecified chronic kidney disease: Secondary | ICD-10-CM | POA: Insufficient documentation

## 2022-01-24 DIAGNOSIS — Z79899 Other long term (current) drug therapy: Secondary | ICD-10-CM | POA: Insufficient documentation

## 2022-01-24 DIAGNOSIS — N189 Chronic kidney disease, unspecified: Secondary | ICD-10-CM | POA: Insufficient documentation

## 2022-01-24 DIAGNOSIS — D649 Anemia, unspecified: Secondary | ICD-10-CM

## 2022-01-24 LAB — CBC WITH DIFFERENTIAL/PLATELET
Abs Immature Granulocytes: 0.01 10*3/uL (ref 0.00–0.07)
Basophils Absolute: 0.1 10*3/uL (ref 0.0–0.1)
Basophils Relative: 1 %
Eosinophils Absolute: 0.1 10*3/uL (ref 0.0–0.5)
Eosinophils Relative: 2 %
HCT: 27.5 % — ABNORMAL LOW (ref 36.0–46.0)
Hemoglobin: 8.3 g/dL — ABNORMAL LOW (ref 12.0–15.0)
Immature Granulocytes: 0 %
Lymphocytes Relative: 20 %
Lymphs Abs: 1.1 10*3/uL (ref 0.7–4.0)
MCH: 26.6 pg (ref 26.0–34.0)
MCHC: 30.2 g/dL (ref 30.0–36.0)
MCV: 88.1 fL (ref 80.0–100.0)
Monocytes Absolute: 0.4 10*3/uL (ref 0.1–1.0)
Monocytes Relative: 7 %
Neutro Abs: 3.7 10*3/uL (ref 1.7–7.7)
Neutrophils Relative %: 70 %
Platelets: 246 10*3/uL (ref 150–400)
RBC: 3.12 MIL/uL — ABNORMAL LOW (ref 3.87–5.11)
RDW: 17.9 % — ABNORMAL HIGH (ref 11.5–15.5)
WBC: 5.3 10*3/uL (ref 4.0–10.5)
nRBC: 0 % (ref 0.0–0.2)

## 2022-01-24 LAB — SAMPLE TO BLOOD BANK

## 2022-01-24 MED ORDER — METOPROLOL TARTRATE 25 MG PO TABS: 25.0000 mg | ORAL_TABLET | Freq: Two times a day (BID) | ORAL | 2 refills | Status: DC

## 2022-01-27 ENCOUNTER — Encounter: Payer: Self-pay | Admitting: Gastroenterology

## 2022-01-27 ENCOUNTER — Ambulatory Visit (INDEPENDENT_AMBULATORY_CARE_PROVIDER_SITE_OTHER): Payer: Medicare Other | Admitting: Gastroenterology

## 2022-01-27 VITALS — BP 119/72 | HR 74 | Temp 98.4°F | Ht 71.0 in | Wt 248.0 lb

## 2022-01-27 DIAGNOSIS — R1319 Other dysphagia: Secondary | ICD-10-CM

## 2022-01-27 MED FILL — Iron Sucrose Inj 20 MG/ML (Fe Equiv): INTRAVENOUS | Qty: 10 | Status: AC

## 2022-01-27 NOTE — Progress Notes (Signed)
Gastroenterology Consultation  Referring Provider:     Juline Patch, MD Primary Care Physician:  Juline Patch, MD Primary Gastroenterologist:  Dr. Allen Norris     Reason for Consultation:     Dysphagia        HPI:   Michele Moore is a 71 y.o. y/o female referred for consultation & management of dysphagia by Dr. Juline Patch, MD. This patient comes in today after being seen by me for an EGD and colonoscopy back in 2019 for iron deficiency anemia.  The patient underwent an upper endoscopy and a colonoscopy that showed adenomatous polyps and a gastric polyp.  The gastric polyp was benign and there is no sign of H. pylori.  The colon polyps were adenomas and the patient was requested to have a repeat colonoscopy in 5 years after that last  one.  The patient was reporting progressive dysphagia when she was seen by her primary care provider back in July this year.  The patient does report that she has episodes of constipation and her dysphagia is mostly to pills and water and sometimes juice but not all foods cause her to have the symptoms.  She also reports that she has been anemic since she is 71 years old and is frequently seeing hematology getting blood transfusion.  Past Medical History:  Diagnosis Date   (HFpEF) heart failure with preserved ejection fraction ([place]) 01/13/2014   a.) TTE 01/13/2014: EF 45%, apical HK, mod LVH, mild MAC, mild LAE/RVE, mod MR/TR/PR; b.) TTE 10/01/2018: EF 55-60%, mild MVH, mild LAE, triv MR/TR, G1DD   Allergy    ANA positive    Anemia of chronic renal failure, stage 3 (moderate), unspecified whether stage 3a or 3b CKD (HCC)    Aortic atherosclerosis (HCC)    Asthma    Cataract    Chronic pain syndrome    a.) followed by [facility] Holley Raring, MD)   Chronic, continuous use of opioids    CKD (chronic kidney disease), stage III ([place])    Complication of anesthesia    a.) anesthesia awareness/premature emergence during colonoscopy   DDD (degenerative  disc disease), lumbar    Dyspnea on exertion    Dysrhythmia    IRREGULAR HEART BEAT   Family history of adverse reaction to anesthesia    sister difficult to put to sleep   GERD (gastroesophageal reflux disease)    Glaucoma    Gout    H/O tooth extraction    all lower teeth 1/19   Hemorrhoid    History of hiatal hernia    History of kidney stones    Hypertension    Hypothyroidism    Implantable loop recorder present 08/22/2021   a.) s/p implantation of [employer] LINQ Reveal ILR; serial #: [id number]   Long term current use of immunosuppressive drug    a.) MTX + apremilast   Migraines    Mixed hyperlipidemia    Multiple gastric ulcers    Osteoarthritis    Psoriasis    Psoriatic arthritis ([place])    a.) on apremilast   Rheumatoid arthritis ([place])    a.) on MTX   SS-A antibody positive    Wears dentures    partial upper and lower    Past Surgical History:  Procedure Laterality Date   ANKLE SURGERY Right    CARPAL TUNNEL RELEASE     x3   COLONOSCOPY  2000?   COLONOSCOPY WITH PROPOFOL N/A 10/22/2017   Procedure: COLONOSCOPY WITH  PROPOFOL;  Surgeon: Lucilla Lame, MD;  Location: [place];  Service: Endoscopy;  Laterality: N/A;   ESOPHAGOGASTRODUODENOSCOPY (EGD) WITH PROPOFOL N/A 10/22/2017   Procedure: ESOPHAGOGASTRODUODENOSCOPY (EGD) WITH PROPOFOL;  Surgeon: Lucilla Lame, MD;  Location: [place];  Service: Endoscopy;  Laterality: N/A;   FUSION OF TALONAVICULAR JOINT Right 08/03/2015   Procedure: TAILOR NAVICULAR JOINT FUSION - RIGHT ;  Surgeon: [name], DPM;  Location: [facility];  Service: Podiatry;  Laterality: Right;   JOINT REPLACEMENT Right    knee x 3 ,right x1 and left x2   LOOP RECORDER IMPLANT  08/22/2021   Procedure: LOOP RECORDER IMPLANT; Location: [place]; Surgeon: Virl Axe, MD   POLYPECTOMY N/A 10/22/2017   Procedure: POLYPECTOMY;  Surgeon: Lucilla Lame, MD;  Location: [place];  Service: Endoscopy;  Laterality: N/A;    ROTATOR CUFF REPAIR Right 1995   TONSILLECTOMY     TOTAL KNEE REVISION Right 01/20/2018   Procedure: POLYETHYLENE EXCHANGE;  Surgeon: Dereck Leep, MD;  Location: [facility];  Service: Orthopedics;  Laterality: Right;   TOTAL KNEE REVISION Left 10/09/2021   Procedure: LEFT TOTAL KNEE REVISION WITH POLYETHYLENE EXCHANGE.;  Surgeon: Dereck Leep, MD;  Location: [facility];  Service: Orthopedics;  Laterality: Left;   TUBAL LIGATION     UPPER GI ENDOSCOPY  2000?    Prior to Admission medications   Medication Sig Start Date End Date Taking? Authorizing Provider  acetaminophen (TYLENOL) 650 MG CR tablet Take 650 mg by mouth every 8 (eight) hours as needed for pain.    [provider]  albuterol (VENTOLIN HFA) 108 (90 Base) MCG/ACT inhaler Inhale 2 puffs into the lungs every 6 (six) hours as needed for wheezing or shortness of breath. 06/04/21   Nance Pear, MD  allopurinol (ZYLOPRIM) 100 MG tablet Take 200 mg by mouth every morning.  08/06/17   [provider]  betamethasone dipropionate 0.05 % cream APPLY TO PSORIASIS ON HANDS TWICE DAILY ONLY. AVOID FACE, GROIN AND AXILLA 05/30/19   Ralene Bathe, MD  betamethasone valerate (VALISONE) 0.1 % cream Apply topically 2 (two) times daily as needed ([place]). 03/15/20   Ralene Bathe, MD  calcipotriene (DOVONOX) 0.005 % cream APPLY TOPICALLY TO PSORIASIS AREAS ON LEGS AND FEET ONCE OR TWICE DAILY 03/28/21   Ralene Bathe, MD  Cholecalciferol (VITAMIN D3) 2000 units TABS Take 2,000 Units by mouth daily.    [provider]  clobetasol ointment (TEMOVATE) 0.05 % Apply topically 2 (two) times daily. 05/28/21   [provider]  diclofenac sodium (VOLTAREN) 1 % GEL Apply 2 g topically 3 (three) times daily.  08/11/17   [provider]  FARXIGA 10 MG TABS tablet Take 10 mg by mouth every morning. 07/15/21   [provider]  furosemide (LASIX) 40 MG tablet Take 40 mg by mouth daily. 07/15/21   [provider]  gabapentin (NEURONTIN) 600 MG tablet Take 1 tablet (600 mg total) by mouth at bedtime. 08/06/21 02/02/22  Gillis Santa, MD  HYDROcodone-acetaminophen (NORCO) 7.5-325 MG tablet Take 1 tablet by mouth every 8 (eight) hours as needed for severe pain. Must last 30 days. 01/22/22 02/21/22  Gillis Santa, MD  HYDROcodone-acetaminophen (NORCO) 7.5-325 MG tablet Take 1 tablet by mouth every 8 (eight) hours as needed for severe pain. Must last 30 days. 02/21/22 03/23/22  Gillis Santa, MD  HYDROcodone-acetaminophen (NORCO) 7.5-325 MG tablet Take 1 tablet by mouth every 8 (eight) hours as needed for severe pain. Must  last 30 days. 03/23/22 04/22/22  Gillis Santa, MD  ketoconazole (NIZORAL) 2 % cream Apply topically daily as needed. 03/19/21   Juline Patch, MD  levothyroxine (SYNTHROID) 150 MCG tablet Take 1 tablet (150 mcg total) by mouth daily. 01/22/22   Juline Patch, MD  losartan (COZAAR) 25 MG tablet Take 25 mg by mouth daily. 12/18/21   [provider]  meclizine (ANTIVERT) 12.5 MG tablet TAKE 1 TABLET(12.5 MG) BY MOUTH THREE TIMES DAILY AS NEEDED FOR DIZZINESS 09/16/21   Juline Patch, MD  metoprolol tartrate (LOPRESSOR) 25 MG tablet Take 1 tablet (25 mg total) by mouth 2 (two) times daily. 01/24/22   Deboraha Sprang, MD  montelukast (SINGULAIR) 10 MG tablet TAKE 1 TABLET(10 MG) BY MOUTH AT BEDTIME 09/16/21   Juline Patch, MD  Multiple Vitamins-Calcium (ONE-A-DAY WOMENS FORMULA PO) Take 1 tablet by mouth daily.    [provider]  mupirocin ointment (BACTROBAN) 2 % Apply 1 application topically 2 (two) times daily. 12/08/19   Juline Patch, MD  nystatin cream (MYCOSTATIN) Apply 1 Application topically 2 (two) times daily. 09/16/21   Juline Patch, MD  omega-3 acid ethyl esters (LOVAZA) 1 g capsule Take 1 capsule (1 g total) by mouth 2 (two) times daily. 11/25/21   Juline Patch, MD  omeprazole (PRILOSEC) 40 MG capsule TAKE 1 CAPSULE(40 MG) BY MOUTH DAILY 09/16/21    Juline Patch, MD  OTEZLA 30 MG TABS Take 1 tablet by mouth daily. 11/12/20   [provider]  Polyethyl Glycol-Propyl Glycol (SYSTANE OP) Place 1 drop into both eyes daily as needed (dry eyes).    [provider]  potassium chloride SA (KLOR-CON M) 20 MEQ tablet Take 1 tablet (20 mEq total) by mouth daily. 11/25/21   Juline Patch, MD  tiZANidine (ZANAFLEX) 4 MG tablet Take 1 tablet (4 mg total) by mouth at bedtime. 11/05/21 10/31/22  Gillis Santa, MD  traMADol (ULTRAM) 50 MG tablet Take 1-2 tablets (50-100 mg total) by mouth every 4 (four) hours as needed for moderate pain. 10/12/21   Reche Dixon, PA-C  triamcinolone (KENALOG) 0.1 % Apply 1 application topically 2 (two) times daily. 03/22/20   Juline Patch, MD  fluticasone (FLONASE) 50 MCG/ACT nasal spray SHAKE LIQUID AND USE 1 SPRAY IN [facility] NOSTRIL DAILY 02/28/19 04/24/19  Juline Patch, MD  mometasone (NASONEX) 50 MCG/ACT nasal spray Place 2 sprays into the nose daily. 04/12/19 04/24/19  Juline Patch, MD    Family History  Problem Relation Age of Onset   Heart failure Mother    Gout Mother    Arthritis Mother    Hypertension Mother    Stroke Mother    Heart failure Father    Diabetes Father    Hyperlipidemia Father    COPD Sister    Cancer Maternal Aunt        breast   Cancer Maternal Uncle        kidney   Breast cancer Neg Hx      Social History   Tobacco Use   Smoking status: Former    Packs/day: 2.00    Years: 20.00    Total pack years: 40.00    Types: Cigarettes    Quit date: 02/24/1993    Years since quitting: 28.9   Smokeless tobacco: Never  Vaping Use   Vaping Use: Never used  Substance Use Topics   Alcohol use: No    Alcohol/week: 0.0 standard drinks  of alcohol   Drug use: No    Allergies as of 01/27/2022 - Review Complete 01/08/2022  Allergen Reaction Noted   Ampicillin Swelling 09/04/2016   Penicillins Anaphylaxis and Swelling 04/19/2014   Vancomycin Rash 03/06/2019   Vibramycin  [doxycycline calcium] Rash 04/19/2014    Review of Systems:    All systems reviewed and negative except where noted in HPI.   Physical Exam:  There were no vitals taken for this visit. No LMP recorded. Patient is postmenopausal. General:   Alert,  Well-developed, well-nourished, pleasant and cooperative in NAD Head:  Normocephalic and atraumatic. Eyes:  Sclera clear, no icterus.   Conjunctiva pink. Ears:  Normal auditory acuity. Neck:  Supple; no masses or thyromegaly. Lungs:  Respirations even and unlabored.  Clear throughout to auscultation.   No wheezes, crackles, or rhonchi. No acute distress. Heart:  Regular rate and rhythm; no murmurs, clicks, rubs, or gallops. Abdomen:  Normal bowel sounds.  No bruits.  Soft, non-tender and non-distended without masses, hepatosplenomegaly or hernias noted.  No guarding or rebound tenderness.  Negative Carnett sign.   Rectal:  Deferred.  Pulses:  Normal pulses noted. Extremities: Positive lower extremity edema.  No cyanosis. Neurologic:  Alert and oriented x3;  grossly normal neurologically. Skin:  Intact without significant lesions or rashes.  No jaundice. Lymph Nodes:  No significant cervical adenopathy. Psych:  Alert and cooperative. Normal mood and affect.  Imaging Studies: CUP PACEART REMOTE DEVICE CHECK  Result Date: 01/07/2022 ILR alert summary report received. Battery status OK. Normal device function. No new symptom, tachy, brady, or pause episodes. 12 new AF episode, 6-31mn in duration,  burden 2.8%, no OAC, dx with SCAF.  EGM appear ST with ectopy. Monthly summary reports  and ROV/PRN LA   Assessment and Plan:   Michele HAMBLENis a 71y.o. y/o female who comes with a history of dysphagia mostly to pills and liquid but also has a history of colon polyp.  Patient has chronic anemia and is being followed by hematology.  Due to the patient's history of polyps and dysphagia she will be set up for a colonoscopy and EGD.  The patient has  been explained the plan and agrees with it.    DLucilla Lame MD. FMarval Regal   Note: This dictation was prepared with Dragon dictation along with smaller phrase technology. Any transcriptional errors that result from this process are unintentional.

## 2022-01-27 NOTE — Patient Instructions (Signed)
Patient has upcoming surgery and daughter is still recovering from illness. Patient is aware that I will call her in January to check on her regarding scheduling procedures at [facility] as she has a Psychologist, forensic.

## 2022-01-28 ENCOUNTER — Inpatient Hospital Stay (HOSPITAL_BASED_OUTPATIENT_CLINIC_OR_DEPARTMENT_OTHER): Payer: Medicare Other | Admitting: Oncology

## 2022-01-28 ENCOUNTER — Inpatient Hospital Stay: Payer: Medicare Other

## 2022-01-28 ENCOUNTER — Encounter: Payer: Self-pay | Admitting: Oncology

## 2022-01-28 VITALS — BP 125/68 | HR 68 | Temp 97.3°F | Resp 18 | Wt 246.5 lb

## 2022-01-28 DIAGNOSIS — Z79899 Other long term (current) drug therapy: Secondary | ICD-10-CM | POA: Diagnosis not present

## 2022-01-28 DIAGNOSIS — D649 Anemia, unspecified: Secondary | ICD-10-CM

## 2022-01-28 DIAGNOSIS — N189 Chronic kidney disease, unspecified: Secondary | ICD-10-CM | POA: Diagnosis not present

## 2022-01-28 DIAGNOSIS — M069 Rheumatoid arthritis, unspecified: Secondary | ICD-10-CM

## 2022-01-28 DIAGNOSIS — N1832 Chronic kidney disease, stage 3b: Secondary | ICD-10-CM

## 2022-01-28 DIAGNOSIS — D631 Anemia in chronic kidney disease: Secondary | ICD-10-CM | POA: Diagnosis not present

## 2022-01-28 DIAGNOSIS — N183 Chronic kidney disease, stage 3 unspecified: Secondary | ICD-10-CM

## 2022-01-28 DIAGNOSIS — I129 Hypertensive chronic kidney disease with stage 1 through stage 4 chronic kidney disease, or unspecified chronic kidney disease: Secondary | ICD-10-CM | POA: Diagnosis not present

## 2022-01-28 MED ORDER — SODIUM CHLORIDE 0.9 % IV SOLN
200.0000 mg | Freq: Once | INTRAVENOUS | Status: AC
  Administered 2022-01-28: 200 mg via INTRAVENOUS
  Filled 2022-01-28: qty 200

## 2022-01-28 MED ORDER — SODIUM CHLORIDE 0.9 % IV SOLN
INTRAVENOUS | Status: DC
  Filled 2022-01-28: qty 250

## 2022-01-28 NOTE — Patient Instructions (Signed)

## 2022-01-29 ENCOUNTER — Encounter: Payer: Self-pay | Admitting: Oncology

## 2022-01-29 NOTE — Progress Notes (Signed)
Hematology/Oncology Progress note [phone] [phone]         Clinic Day:  01/29/2022  ASSESSMENT & PLAN:   Anemia due to stage 3b chronic kidney disease ([place]) Labs reviewed and discussed with patient.  Hemoglobin has improved to 8. Ferritin 134, recommend IV Venofer to further increase iron stores, goal of ferritin above 200. Proceed with Retacrit 20000 units x 1 this week Monitor labs every 2 weeks +/- Retacrit if hemoglobin is less than 10. Follow-up with gastroenterology for workup.  Rheumatoid arthritis (HCC) #Rheumatoid arthritis on methotrexate.  Continue folic acid 3 days/week.    Orders Placed This Encounter  Procedures   CBC with Differential/Platelet    Standing Status:   Standing    Number of Occurrences:   2    Standing Expiration Date:   01/29/2023   CBC with Differential/Platelet    Standing Status:   Future    Standing Expiration Date:   01/29/2023   Iron and TIBC    Standing Status:   Future    Standing Expiration Date:   01/29/2023   Ferritin    Standing Status:   Future    Standing Expiration Date:   01/29/2023    Follow-up appointment Per LOS  All questions were answered. The patient knows to call the clinic with any problems, questions or concerns.  Michele Server, MD, PhD [facility] Hematology Oncology 01/28/2022    Chief Complaint: Michele Moore is a 71 y.o. female presents for anemia in stage IIIb chronic kidney disease   [name] is a 71 y.o.afemale who has above oncology history reviewed by me today presented for follow up visit for management of anemia in CKD  PERTINENT HEMATOLOGY HISTORY Patient previously followed up by Michele Moore, patient switched care to me on 08/30/20 Extensive medical record review was performed by me  # anemia in stage IIIb chronic kidney disease.  Work-up on 04/23/2020 revealed a hematocrit of 35.0, hemoglobin 11.4, MCV 92.3, platelets 168,000, WBC 4,600 with an  ANC of 3000. Ferritin was 107 with an iron saturation of 37% and a TIBC of 315. Sed rate was 54 and CRP was 0.9.  Vitamin B12 was 429 and folate >100.0.   10/22/2017 Colonoscopy for heme positive stool revealed one 7 mm polyp in the ascending colon, one 3 mm polyp in the transverse colon, and three 1 to 4 mm polyps in the sigmoid colon. There were non-bleeding internal hemorrhoids. There were petechia(e) in the rectum. Pathology showed two tubular adenomas and one hyperplastic polyp. EGD on 10/22/2017 showed gastritis and one gastric polyp.  She has Sjogren's syndrome, rheumatoid arthritis, hypertension, and proteinuria.  She takes oral iron once a day.  Patient follows up with pain clinic for pain management.  She previously tolerated IV Venofer treatments.  INTERVAL HISTORY Michele Moore is a 71 y.o. female who has above history reviewed by me today presents for follow up visit for anemia secondary to chronic kidney disease. Fatigue has improved.  Patient has received Venofer treatments and Venofer treatments. He was seen by Michele Moore and will get endoscopy workup.   Past Medical History:  Diagnosis Date   (HFpEF) heart failure with preserved ejection fraction ([place]) 01/13/2014   a.) TTE 01/13/2014: EF 45%, apical HK, mod LVH, mild MAC, mild LAE/RVE, mod MR/TR/PR; b.) TTE 10/01/2018: EF 55-60%, mild MVH, mild LAE, triv MR/TR, G1DD   Allergy    ANA positive    Anemia of chronic renal failure,  stage 3 (moderate), unspecified whether stage 3a or 3b CKD (HCC)    Aortic atherosclerosis (HCC)    Asthma    Cataract    Chronic pain syndrome    a.) followed by Michele Moore)   Chronic, continuous use of opioids    CKD (chronic kidney disease), stage III (HCC)    Complication of anesthesia    a.) anesthesia awareness/premature emergence during colonoscopy   DDD (degenerative disc disease), lumbar    Dyspnea on exertion    Dysrhythmia    IRREGULAR HEART BEAT   Family history  of adverse reaction to anesthesia    sister difficult to put to sleep   GERD (gastroesophageal reflux disease)    Glaucoma    Gout    H/O tooth extraction    all lower teeth 1/19   Hemorrhoid    History of hiatal hernia    History of kidney stones    Hypertension    Hypothyroidism    Implantable loop recorder present 08/22/2021   a.) s/p implantation of [employer] LINQ Reveal ILR; serial #: [id number]   Long term current use of immunosuppressive drug    a.) MTX + apremilast   Migraines    Mixed hyperlipidemia    Multiple gastric ulcers    Osteoarthritis    Psoriasis    Psoriatic arthritis ([place])    a.) on apremilast   Rheumatoid arthritis ([place])    a.) on MTX   SS-A antibody positive    Wears dentures    partial upper and lower    Past Surgical History:  Procedure Laterality Date   ANKLE SURGERY Right    CARPAL TUNNEL RELEASE     x3   COLONOSCOPY  2000?   COLONOSCOPY WITH PROPOFOL N/A 10/22/2017   Procedure: COLONOSCOPY WITH PROPOFOL;  Surgeon: Michele Lame, Moore;  Location: [place];  Service: Endoscopy;  Laterality: N/A;   ESOPHAGOGASTRODUODENOSCOPY (EGD) WITH PROPOFOL N/A 10/22/2017   Procedure: ESOPHAGOGASTRODUODENOSCOPY (EGD) WITH PROPOFOL;  Surgeon: Michele Lame, Moore;  Location: [place];  Service: Endoscopy;  Laterality: N/A;   FUSION OF TALONAVICULAR JOINT Right 08/03/2015   Procedure: TAILOR NAVICULAR JOINT FUSION - RIGHT ;  Surgeon: Michele Moore, DPM;  Location: [facility];  Service: Podiatry;  Laterality: Right;   JOINT REPLACEMENT Right    knee x 3 ,right x1 and left x2   LOOP RECORDER IMPLANT  08/22/2021   Procedure: LOOP RECORDER IMPLANT; Location: [place]; Surgeon: Michele Axe, Moore   POLYPECTOMY N/A 10/22/2017   Procedure: POLYPECTOMY;  Surgeon: Michele Lame, Moore;  Location: [place];  Service: Endoscopy;  Laterality: N/A;   ROTATOR CUFF REPAIR Right 1995   TONSILLECTOMY     TOTAL KNEE REVISION Right 01/20/2018   Procedure:  POLYETHYLENE EXCHANGE;  Surgeon: Michele Leep, Moore;  Location: [facility];  Service: Orthopedics;  Laterality: Right;   TOTAL KNEE REVISION Left 10/09/2021   Procedure: LEFT TOTAL KNEE REVISION WITH POLYETHYLENE EXCHANGE.;  Surgeon: Michele Leep, Moore;  Location: [facility];  Service: Orthopedics;  Laterality: Left;   TUBAL LIGATION     UPPER GI ENDOSCOPY  2000?    Family History  Problem Relation Age of Onset   Heart failure Mother    Gout Mother    Arthritis Mother    Hypertension Mother    Stroke Mother    Heart failure Father    Diabetes Father    Hyperlipidemia Father    COPD Sister    Cancer Maternal  Aunt        breast   Cancer Maternal Uncle        kidney   Breast cancer Neg Hx     Social History:  reports that she quit smoking about 28 years ago. Her smoking use included cigarettes. She has a 40.00 pack-year smoking history. She has never used smokeless tobacco. She reports that she does not drink alcohol and does not use drugs.  Allergies:  Allergies  Allergen Reactions   Ampicillin Swelling   Penicillins Anaphylaxis and Swelling    IgE = 10 (11/05/2017)  Has patient had a PCN reaction causing immediate rash, facial/tongue/throat swelling, SOB or lightheadedness with hypotension: Yes Has patient had a PCN reaction causing severe rash involving mucus membranes or skin necrosis: No Has patient had a PCN reaction that required hospitalization: No Has patient had a PCN reaction occurring within the last 10 years: Yes If all of the above answers are "NO", then may proceed with Cephalosporin use.   Vancomycin Rash    Other reaction(s): Other (see comments)   Vibramycin [Doxycycline Calcium] Rash    Current Medications: Current Outpatient Medications  Medication Sig Dispense Refill   acetaminophen (TYLENOL) 650 MG CR tablet Take 650 mg by mouth every 8 (eight) hours as needed for pain.     albuterol (VENTOLIN HFA) 108 (90 Base) MCG/ACT inhaler Inhale 2 puffs into the  lungs every 6 (six) hours as needed for wheezing or shortness of breath. 8 g 2   allopurinol (ZYLOPRIM) 100 MG tablet Take 200 mg by mouth every morning.      betamethasone dipropionate 0.05 % cream APPLY TO PSORIASIS ON HANDS TWICE DAILY ONLY. AVOID FACE, GROIN AND AXILLA 45 g 0   betamethasone valerate (VALISONE) 0.1 % cream Apply topically 2 (two) times daily as needed (Rash). 45 g 3   calcipotriene (DOVONOX) 0.005 % cream APPLY TOPICALLY TO PSORIASIS AREAS ON LEGS AND FEET ONCE OR TWICE DAILY 540 g 1   Cholecalciferol (VITAMIN D3) 2000 units TABS Take 2,000 Units by mouth daily.     clobetasol ointment (TEMOVATE) 0.05 % Apply topically 2 (two) times daily.     diclofenac sodium (VOLTAREN) 1 % GEL Apply 2 g topically 3 (three) times daily.      FARXIGA 10 MG TABS tablet Take 10 mg by mouth every morning.     furosemide (LASIX) 40 MG tablet Take 40 mg by mouth daily.     gabapentin (NEURONTIN) 600 MG tablet Take 1 tablet (600 mg total) by mouth at bedtime. 30 tablet 5   HYDROcodone-acetaminophen (NORCO) 7.5-325 MG tablet Take 1 tablet by mouth every 8 (eight) hours as needed for severe pain. Must last 30 days. 90 tablet 0   [START ON 02/21/2022] HYDROcodone-acetaminophen (NORCO) 7.5-325 MG tablet Take 1 tablet by mouth every 8 (eight) hours as needed for severe pain. Must last 30 days. 90 tablet 0   [START ON 03/23/2022] HYDROcodone-acetaminophen (NORCO) 7.5-325 MG tablet Take 1 tablet by mouth every 8 (eight) hours as needed for severe pain. Must last 30 days. 90 tablet 0   ketoconazole (NIZORAL) 2 % cream Apply topically daily as needed. 15 g 2   levothyroxine (SYNTHROID) 150 MCG tablet Take 1 tablet (150 mcg total) by mouth daily. 30 tablet 0   losartan (COZAAR) 25 MG tablet Take 25 mg by mouth daily.     meclizine (ANTIVERT) 12.5 MG tablet TAKE 1 TABLET(12.5 MG) BY MOUTH THREE TIMES DAILY AS NEEDED FOR DIZZINESS 30  tablet 1   metoprolol tartrate (LOPRESSOR) 25 MG tablet Take 1 tablet (25 mg  total) by mouth 2 (two) times daily. 180 tablet 2   montelukast (SINGULAIR) 10 MG tablet TAKE 1 TABLET(10 MG) BY MOUTH AT BEDTIME 90 tablet 1   Multiple Vitamins-Calcium (ONE-A-DAY WOMENS FORMULA PO) Take 1 tablet by mouth daily.     mupirocin ointment (BACTROBAN) 2 % Apply 1 application topically 2 (two) times daily. 22 g 0   nystatin cream (MYCOSTATIN) Apply 1 Application topically 2 (two) times daily. 30 g 0   omega-3 acid ethyl esters (LOVAZA) 1 g capsule Take 1 capsule (1 g total) by mouth 2 (two) times daily. 180 capsule 1   omeprazole (PRILOSEC) 40 MG capsule TAKE 1 CAPSULE(40 MG) BY MOUTH DAILY 90 capsule 1   OTEZLA 30 MG TABS Take 1 tablet by mouth daily.     Polyethyl Glycol-Propyl Glycol (SYSTANE OP) Place 1 drop into both eyes daily as needed (dry eyes).     potassium chloride SA (KLOR-CON M) 20 MEQ tablet Take 1 tablet (20 mEq total) by mouth daily. 90 tablet 1   tiZANidine (ZANAFLEX) 4 MG tablet Take 1 tablet (4 mg total) by mouth at bedtime. 60 tablet 5   triamcinolone (KENALOG) 0.1 % Apply 1 application topically 2 (two) times daily. 30 g 0   traMADol (ULTRAM) 50 MG tablet Take 1-2 tablets (50-100 mg total) by mouth every 4 (four) hours as needed for moderate pain. (Patient not taking: Reported on 01/28/2022) 30 tablet 0   No current facility-administered medications for this visit.    Review of Systems  Constitutional:  Negative for chills, diaphoresis, fever, malaise/fatigue and weight loss.  HENT:  Negative for congestion, ear discharge, ear pain, hearing loss, nosebleeds, sinus pain, sore throat and tinnitus.   Eyes:        Glaucoma  Respiratory:  Negative for cough, hemoptysis, sputum production and shortness of breath (on exertion, due to asthma).   Cardiovascular:  Positive for leg swelling (right ankle). Negative for chest pain and palpitations.  Gastrointestinal:  Negative for abdominal pain, blood in stool, constipation, diarrhea, heartburn, melena, nausea and  vomiting.  Genitourinary:  Negative for dysuria, frequency, hematuria and urgency.  Musculoskeletal:  Positive for joint pain (arthritis). Negative for back pain, myalgias and neck pain.       Status post knee replacement  Skin:  Negative for itching and rash.  Neurological:  Positive for sensory change (foot numbness s/p surgery). Negative for dizziness, tingling, weakness and headaches.  Endo/Heme/Allergies:  Does not bruise/bleed easily.  Psychiatric/Behavioral:  Negative for depression and memory loss. The patient is not nervous/anxious and does not have insomnia.   All other systems reviewed and are negative.   Performance status (ECOG): 1-2  Vitals Blood pressure 125/68, pulse 68, temperature (!) 97.3 F (36.3 C), resp. rate 18, weight 246 lb 8 oz (111.8 kg).   Physical Exam Vitals and nursing note reviewed.  Constitutional:      General: She is not in acute distress.    Appearance: She is not diaphoretic.  Eyes:     General: No scleral icterus.    Conjunctiva/sclera: Conjunctivae normal.  Neurological:     Mental Status: She is alert and oriented to person, place, and time.  Psychiatric:        Behavior: Behavior normal.        Thought Content: Thought content normal.        Judgment: Judgment normal.    Labs  Latest Ref Rng & Units 01/24/2022    2:46 PM 01/13/2022   11:48 AM 01/10/2022    2:25 PM  CBC  WBC 4.0 - 10.5 K/uL 5.3  7.2  6.6   Hemoglobin 12.0 - 15.0 g/dL 8.3  6.9  7.0   Hematocrit 36.0 - 46.0 % 27.5  22.7  22.9   Platelets 150 - 400 K/uL 246  261  274       Latest Ref Rng & Units 01/08/2022    3:31 PM 12/18/2021    7:48 PM 11/20/2021    1:23 PM  CMP  Glucose 70 - 99 mg/dL 103  110  100   BUN 8 - 23 mg/dL 35  31  36   Creatinine 0.44 - 1.00 mg/dL 2.07  2.28  2.62   Sodium 135 - 145 mmol/L 138  138  138   Potassium 3.5 - 5.1 mmol/L 3.9  4.0  3.7   Chloride 98 - 111 mmol/L 102  105  102   CO2 22 - 32 mmol/L _0 Calcium 8.9 - 10.3  mg/dL 9.8  10.1  10.2   Total Protein 6.5 - 8.1 g/dL 7.4   6.8   Total Bilirubin 0.3 - 1.2 mg/dL 0.4   0.4   Alkaline Phos 38 - 126 U/L 53   67   AST 15 - 41 U/L 21   22   ALT 0 - 44 U/L 10   11

## 2022-01-29 NOTE — Assessment & Plan Note (Addendum)
Labs reviewed and discussed with patient.  Hemoglobin has improved to 8. Ferritin 134, recommend IV Venofer to further increase iron stores, goal of ferritin above 200. Proceed with Retacrit 20000 units x 1 this week Monitor labs every 2 weeks +/- Retacrit if hemoglobin is less than 10. Follow-up with gastroenterology for workup.

## 2022-01-29 NOTE — Assessment & Plan Note (Signed)
#  Rheumatoid arthritis on methotrexate.  Continue folic acid 3 days/week.

## 2022-01-30 ENCOUNTER — Inpatient Hospital Stay: Payer: Medicare Other

## 2022-01-30 ENCOUNTER — Ambulatory Visit: Payer: Medicare Other | Admitting: Family Medicine

## 2022-01-30 VITALS — BP 156/75 | HR 72

## 2022-01-30 DIAGNOSIS — I129 Hypertensive chronic kidney disease with stage 1 through stage 4 chronic kidney disease, or unspecified chronic kidney disease: Secondary | ICD-10-CM | POA: Diagnosis not present

## 2022-01-30 DIAGNOSIS — N183 Chronic kidney disease, stage 3 unspecified: Secondary | ICD-10-CM

## 2022-01-30 DIAGNOSIS — N189 Chronic kidney disease, unspecified: Secondary | ICD-10-CM | POA: Diagnosis not present

## 2022-01-30 DIAGNOSIS — D649 Anemia, unspecified: Secondary | ICD-10-CM

## 2022-01-30 DIAGNOSIS — D631 Anemia in chronic kidney disease: Secondary | ICD-10-CM | POA: Diagnosis not present

## 2022-01-30 DIAGNOSIS — Z79899 Other long term (current) drug therapy: Secondary | ICD-10-CM | POA: Diagnosis not present

## 2022-01-30 MED ORDER — EPOETIN ALFA-EPBX 10000 UNIT/ML IJ SOLN
20000.0000 [IU] | Freq: Once | INTRAMUSCULAR | Status: AC
  Administered 2022-01-30: 20000 [IU] via SUBCUTANEOUS
  Filled 2022-01-30: qty 2

## 2022-02-03 ENCOUNTER — Ambulatory Visit: Payer: Medicare Other

## 2022-02-03 MED FILL — Iron Sucrose Inj 20 MG/ML (Fe Equiv): INTRAVENOUS | Qty: 10 | Status: AC

## 2022-02-04 ENCOUNTER — Inpatient Hospital Stay: Payer: Medicare Other

## 2022-02-04 ENCOUNTER — Ambulatory Visit: Payer: Medicare Other

## 2022-02-04 VITALS — BP 120/59 | HR 59 | Temp 98.0°F | Resp 17

## 2022-02-04 DIAGNOSIS — N183 Chronic kidney disease, stage 3 unspecified: Secondary | ICD-10-CM

## 2022-02-04 DIAGNOSIS — D631 Anemia in chronic kidney disease: Secondary | ICD-10-CM | POA: Diagnosis not present

## 2022-02-04 DIAGNOSIS — Z79899 Other long term (current) drug therapy: Secondary | ICD-10-CM | POA: Diagnosis not present

## 2022-02-04 DIAGNOSIS — N189 Chronic kidney disease, unspecified: Secondary | ICD-10-CM | POA: Diagnosis not present

## 2022-02-04 DIAGNOSIS — D649 Anemia, unspecified: Secondary | ICD-10-CM

## 2022-02-04 DIAGNOSIS — I129 Hypertensive chronic kidney disease with stage 1 through stage 4 chronic kidney disease, or unspecified chronic kidney disease: Secondary | ICD-10-CM | POA: Diagnosis not present

## 2022-02-04 MED ORDER — SODIUM CHLORIDE 0.9 % IV SOLN
200.0000 mg | Freq: Once | INTRAVENOUS | Status: AC
  Administered 2022-02-04: 200 mg via INTRAVENOUS
  Filled 2022-02-04: qty 200

## 2022-02-04 MED ORDER — SODIUM CHLORIDE 0.9% FLUSH
10.0000 mL | Freq: Once | INTRAVENOUS | Status: AC | PRN
  Administered 2022-02-04: 10 mL
  Filled 2022-02-04: qty 10

## 2022-02-04 MED ORDER — SODIUM CHLORIDE 0.9 % IV SOLN
Freq: Once | INTRAVENOUS | Status: AC
  Filled 2022-02-04: qty 250

## 2022-02-04 NOTE — Patient Instructions (Signed)

## 2022-02-04 NOTE — Progress Notes (Signed)
Patient still has headache 21mn later & she states she feels weak & normally when she feels like this she needs a blood transfusion. She requests labs today.  MD aware & notified. Per MD ok to "discharged patient & encouraged oral hydration" despite symptoms. Patient aware. BP 120/59. P 59. RN assisted patient to car. Educated patient to call tomorrow if symptoms do not improve. Patient agrees & verbalizes understanding.

## 2022-02-04 NOTE — Progress Notes (Signed)
Patient 30 min post Venofer watch. BP is 101/45 rechecked 98/52. Prior to Venofer, 151/107 rechecked 104/62. Patient states she a slight headache but other wise feels fine. MD notified.

## 2022-02-07 ENCOUNTER — Other Ambulatory Visit: Payer: Self-pay | Admitting: Family Medicine

## 2022-02-07 NOTE — Telephone Encounter (Signed)
Medication Refill - Medication: losartan (COZAAR) 25 MG tablet [[id number]] Pt asked if there is another BP med she is suppose to be taking / since this was denied    Has the patient contacted their pharmacy? Yes.   (Agent: If no, request that the patient contact the pharmacy for the refill. If patient does not wish to contact the pharmacy document the reason why and proceed with request.) (Agent: If yes, when and what did the pharmacy advise?) Pt stated the pharmacy advised her the refill was denied / please advise   Preferred Pharmacy (with phone number or street name): [facility], [place] [address] AT [place]  Has the patient been seen for an appointment in the last year OR does the patient have an upcoming appointment? Yes.    Agent: Please be advised that RX refills may take up to 3 business days. We ask that you follow-up with your pharmacy.

## 2022-02-07 NOTE — Telephone Encounter (Signed)
Requested medication (s) are due for refill today: Amount not specified  Requested medication (s) are on the active medication list: yes    Last refill: 12/31/21  Amount not specified  Future visit scheduled yes 03/20/22  Notes to clinic:Historical provider. Per agent: Pt asked if there is another BP med she is suppose to be taking / since this was denied.  Spoke with pt. pt yelling and difficult to understand, hung up.  Unsure why med denied other than historical med.   Requested Prescriptions  Pending Prescriptions Disp Refills   losartan (COZAAR) 25 MG tablet      Sig: Take 1 tablet (25 mg total) by mouth daily.     Cardiovascular:  Angiotensin Receptor Blockers Failed - 02/07/2022 11:40 AM      Failed - Cr in normal range and within 180 days    Creatinine  Date Value Ref Range Status  12/27/2013 1.63 (H) 0.60 - 1.30 mg/dL Final   Creatinine, Ser  Date Value Ref Range Status  01/08/2022 2.07 (H) 0.44 - 1.00 mg/dL Final         Passed - K in normal range and within 180 days    Potassium  Date Value Ref Range Status  01/08/2022 3.9 3.5 - 5.1 mmol/L Final  12/27/2013 3.8 3.5 - 5.1 mmol/L Final         Passed - Patient is not pregnant      Passed - Last BP in normal range    BP Readings from Last 1 Encounters:  02/04/22 (!) 120/59         Passed - Valid encounter within last 6 months    Recent Outpatient Visits           1 month ago Hypotension due to hypovolemia   [facility] [place], Deanna C, MD   2 months ago Hypothyroidism, unspecified type   [facility] [place], [place], MD   4 months ago Hypertension, unspecified type   [facility] [place], [place], MD   10 months ago Hypertension, unspecified type   [facility] [place], Deanna C, MD   1 year ago Cystitis    [facility] at [place], [place], MD       Future Appointments             In 1 month Juline Patch, MD [place] [facility] at [facility]

## 2022-02-10 ENCOUNTER — Ambulatory Visit
Admission: RE | Admit: 2022-02-10 | Discharge: 2022-02-10 | Disposition: A | Discharge status: Home / Self Care | Payer: Medicare Other | Source: Ambulatory Visit | Attending: Student in an Organized Health Care Education/Training Program | Admitting: Student in an Organized Health Care Education/Training Program

## 2022-02-10 ENCOUNTER — Ambulatory Visit
Payer: Medicare Other | Attending: Student in an Organized Health Care Education/Training Program | Admitting: Student in an Organized Health Care Education/Training Program

## 2022-02-10 ENCOUNTER — Encounter: Payer: Self-pay | Admitting: Student in an Organized Health Care Education/Training Program

## 2022-02-10 ENCOUNTER — Ambulatory Visit (INDEPENDENT_AMBULATORY_CARE_PROVIDER_SITE_OTHER): Payer: Medicare Other

## 2022-02-10 ENCOUNTER — Other Ambulatory Visit: Payer: Self-pay | Admitting: *Deleted

## 2022-02-10 VITALS — BP 131/84 | HR 61 | Temp 96.7°F | Resp 16 | Ht 71.0 in | Wt 240.0 lb

## 2022-02-10 DIAGNOSIS — M19011 Primary osteoarthritis, right shoulder: Secondary | ICD-10-CM | POA: Insufficient documentation

## 2022-02-10 DIAGNOSIS — G894 Chronic pain syndrome: Secondary | ICD-10-CM | POA: Diagnosis not present

## 2022-02-10 DIAGNOSIS — M5416 Radiculopathy, lumbar region: Secondary | ICD-10-CM

## 2022-02-10 DIAGNOSIS — M47812 Spondylosis without myelopathy or radiculopathy, cervical region: Secondary | ICD-10-CM

## 2022-02-10 DIAGNOSIS — M47816 Spondylosis without myelopathy or radiculopathy, lumbar region: Secondary | ICD-10-CM

## 2022-02-10 DIAGNOSIS — M5136 Other intervertebral disc degeneration, lumbar region: Secondary | ICD-10-CM

## 2022-02-10 DIAGNOSIS — R002 Palpitations: Secondary | ICD-10-CM | POA: Diagnosis not present

## 2022-02-10 MED ORDER — ROPIVACAINE HCL 2 MG/ML IJ SOLN
2.0000 mL | Freq: Once | INTRAMUSCULAR | Status: AC
  Administered 2022-02-10: 2 mL via EPIDURAL
  Filled 2022-02-10: qty 20

## 2022-02-10 MED ORDER — GABAPENTIN 600 MG PO TABS: 600.0000 mg | ORAL_TABLET | Freq: Every day | ORAL | 5 refills | Status: DC

## 2022-02-10 MED ORDER — DEXAMETHASONE SODIUM PHOSPHATE 10 MG/ML IJ SOLN
10.0000 mg | Freq: Once | INTRAMUSCULAR | Status: AC
  Administered 2022-02-10: 10 mg
  Filled 2022-02-10: qty 1

## 2022-02-10 MED ORDER — IOHEXOL 180 MG/ML  SOLN
10.0000 mL | Freq: Once | INTRAMUSCULAR | Status: AC
  Administered 2022-02-10: 10 mL via EPIDURAL
  Filled 2022-02-10: qty 20

## 2022-02-10 MED ORDER — SODIUM CHLORIDE 0.9% FLUSH
2.0000 mL | Freq: Once | INTRAVENOUS | Status: AC
  Administered 2022-02-10: 2 mL

## 2022-02-10 MED ORDER — LIDOCAINE HCL 2 % IJ SOLN
20.0000 mL | Freq: Once | INTRAMUSCULAR | Status: AC
  Administered 2022-02-10: 400 mg
  Filled 2022-02-10: qty 20

## 2022-02-10 MED ORDER — ROPIVACAINE HCL 2 MG/ML IJ SOLN
4.0000 mL | Freq: Once | INTRAMUSCULAR | Status: AC
  Administered 2022-02-10: 4 mL via INTRA_ARTICULAR
  Filled 2022-02-10: qty 20

## 2022-02-10 NOTE — Progress Notes (Signed)
Safety precautions to be maintained throughout the outpatient stay will include: orient to surroundings, keep bed in low position, maintain call bell within reach at all times, provide assistance with transfer out of bed and ambulation.  

## 2022-02-10 NOTE — Progress Notes (Signed)
Patient's Name: Michele Moore  MRN: [id number]  Referring Provider: Juline Patch, MD  DOB: 06/19/1950  PCP: Juline Patch, MD  DOS: 02/10/2022  Note by: Gillis Santa, MD  Service setting: Ambulatory outpatient  Specialty: Interventional Pain Management  Patient type: Established  Location: [facility] (AMB) Pain Management Facility  Visit type: Interventional Procedure   Primary Reason for Visit: Interventional Pain Management Treatment. CC: Back Pain (Lumbar left ) and Shoulder Pain (Right )  Procedure:          Anesthesia, Analgesia, Anxiolysis:  Type: Therapeutic Inter-Laminar Epidural Steroid Injection #3  Region: Lumbar Level: L4-5 Level. Laterality: Left-Sided          Local Anesthetic: Lidocaine 1-2%   Indications: Lumbar radicular pain  Pain Score: Pre-procedure: 8 /10 Post-procedure: 0-No pain/10  Pre-op Assessment:  [name] is a 71 y.o. (year old), female patient, seen today for interventional treatment. She  has a past surgical history that includes Carpal tunnel release; Rotator cuff repair (Right, 1995); Ankle surgery (Right); Tubal ligation; Colonoscopy (2000?); Upper gi endoscopy (2000?); Tonsillectomy; Fusion of talonavicular joint (Right, 08/03/2015); Colonoscopy with propofol (N/A, 10/22/2017); Esophagogastroduodenoscopy (egd) with propofol (N/A, 10/22/2017); polypectomy (N/A, 10/22/2017); Total knee revision (Right, 01/20/2018); Joint replacement (Right); Loop recorder implant (08/22/2021); and Total knee revision (Left, 10/09/2021). Ms. Cathell has a current medication list which includes the following prescription(s): acetaminophen, albuterol, allopurinol, betamethasone dipropionate, betamethasone valerate, calcipotriene, vitamin d3, clobetasol ointment, diclofenac sodium, farxiga, furosemide, hydrocodone-acetaminophen, [START ON 02/21/2022] hydrocodone-acetaminophen, [START ON 03/23/2022] hydrocodone-acetaminophen, ketoconazole, levothyroxine, losartan, meclizine, metoprolol  tartrate, montelukast, multiple vitamins-calcium, mupirocin ointment, nystatin cream, omega-3 acid ethyl esters, omeprazole, otezla, polyethyl glycol-propyl glycol, potassium chloride sa, tizanidine, triamcinolone cream, gabapentin, tramadol, [DISCONTINUED] fluticasone, and [DISCONTINUED] mometasone. Her primarily concern today is the Back Pain (Lumbar left ) and Shoulder Pain (Right )  Initial Vital Signs:  Pulse/HCG Rate: (!) 55  Temp: (!) 96.7 F (35.9 C) Resp: 16 BP: 98/69 SpO2: 97 %  BMI: Estimated body mass index is 33.47 kg/m as calculated from the following:   Height as of this encounter: '5\' 11"'$  (1.803 m).   Weight as of this encounter: 240 lb (108.9 kg).  Risk Assessment: Allergies: Reviewed. She is allergic to ampicillin, penicillins, vancomycin, and vibramycin [doxycycline calcium].  Allergy Precautions: None required Coagulopathies: Reviewed. None identified.  Blood-thinner therapy: None at this time Active Infection(s): Reviewed. None identified. [name] is afebrile  Site Confirmation: [name] was asked to confirm the procedure and laterality before marking the site Procedure checklist: Completed Consent: Before the procedure and under the influence of no sedative(s), amnesic(s), or anxiolytics, the patient was informed of the treatment options, risks and possible complications. To fulfill our ethical and legal obligations, as recommended by the American Medical Association's Code of Ethics, I have informed the patient of my clinical impression; the nature and purpose of the treatment or procedure; the risks, benefits, and possible complications of the intervention; the alternatives, including doing nothing; the risk(s) and benefit(s) of the alternative treatment(s) or procedure(s); and the risk(s) and benefit(s) of doing nothing. The patient was provided information about the general risks and possible complications associated with the procedure. These may include, but are  not limited to: failure to achieve desired goals, infection, bleeding, organ or nerve damage, allergic reactions, paralysis, and death. In addition, the patient was informed of those risks and complications associated to Spine-related procedures, such as failure to decrease pain; infection (i.e.: Meningitis, epidural or intraspinal abscess); bleeding (i.e.: epidural hematoma, subarachnoid  hemorrhage, or any other type of intraspinal or peri-dural bleeding); organ or nerve damage (i.e.: Any type of peripheral nerve, nerve root, or spinal cord injury) with subsequent damage to sensory, motor, and/or autonomic systems, resulting in permanent pain, numbness, and/or weakness of one or several areas of the body; allergic reactions; (i.e.: anaphylactic reaction); and/or death. Furthermore, the patient was informed of those risks and complications associated with the medications. These include, but are not limited to: allergic reactions (i.e.: anaphylactic or anaphylactoid reaction(s)); adrenal axis suppression; blood sugar elevation that in diabetics may result in ketoacidosis or comma; water retention that in patients with history of congestive heart failure may result in shortness of breath, pulmonary edema, and decompensation with resultant heart failure; weight gain; swelling or edema; medication-induced neural toxicity; particulate matter embolism and blood vessel occlusion with resultant organ, and/or nervous system infarction; and/or aseptic necrosis of one or more joints. Finally, the patient was informed that Medicine is not an exact science; therefore, there is also the possibility of unforeseen or unpredictable risks and/or possible complications that may result in a catastrophic outcome. The patient indicated having understood very clearly. We have given the patient no guarantees and we have made no promises. Enough time was given to the patient to ask questions, all of which were answered to the patient's  satisfaction. [name] has indicated that she wanted to continue with the procedure. Attestation: I, the ordering provider, attest that I have discussed with the patient the benefits, risks, side-effects, alternatives, likelihood of achieving goals, and potential problems during recovery for the procedure that I have provided informed consent. Date  Time:   Pre-Procedure Preparation:  Monitoring: As per clinic protocol. Respiration, ETCO2, SpO2, BP, heart rate and rhythm monitor placed and checked for adequate function Safety Precautions: Patient was assessed for positional comfort and pressure points before starting the procedure. Time-out: I initiated and conducted the "Time-out" before starting the procedure, as per protocol. The patient was asked to participate by confirming the accuracy of the "Time Out" information. Verification of the correct person, site, and procedure were performed and confirmed by me, the nursing staff, and the patient. "Time-out" conducted as per Joint Commission's Universal Protocol (UP.01.01.01). Time: 1127  Description of Procedure:          Position: Prone with head of the table was raised to facilitate breathing. Target Area: The interlaminar space, initially targeting the lower laminar border of the superior vertebral body. Approach: Paramedial approach. Area Prepped: Entire Posterior Lumbar Region Prepping solution: ChloraPrep (2% chlorhexidine gluconate and 70% isopropyl alcohol) Safety Precautions: Aspiration looking for blood return was conducted prior to all injections. At no point did we inject any substances, as a needle was being advanced. No attempts were made at seeking any paresthesias. Safe injection practices and needle disposal techniques used. Medications properly checked for expiration dates. SDV (single dose vial) medications used. Description of the Procedure: Protocol guidelines were followed. The procedure needle was introduced through the  skin, ipsilateral to the reported pain, and advanced to the target area. Bone was contacted and the needle walked caudad, until the lamina was cleared. The epidural space was identified using "loss-of-resistance technique" with 2-3 ml of PF-NaCl (0.9% NSS), in a 5cc LOR glass syringe. Vitals:   02/10/22 1047 02/10/22 1115 02/10/22 1125 02/10/22 1135  BP: 98/69 118/68 128/86 131/84  Pulse: (!) 55 (!) 57 (!) 56 61  Resp: '16 15 16 16  '$ Temp: (!) 96.7 F (35.9 C)     TempSrc:  Temporal     SpO2: 97% 95% 96% 97%  Weight: 240 lb (108.9 kg)     Height: '5\' 11"'$  (1.803 m)        Start Time: 1127 hrs. End Time: 1133 hrs. Materials:  Needle(s) Type: Epidural needle Gauge: 22G Length: 3.5-in Medication(s): Please see orders for medications and dosing details. 6 CC solution made of 3 cc of preservative-free saline, 1 cc of Decadron 10 mg/cc, 2 cc of 0.2% ropivacaine. Imaging Guidance (Spinal):          Type of Imaging Technique: Fluoroscopy Guidance (Spinal) Indication(s): Assistance in needle guidance and placement for procedures requiring needle placement in or near specific anatomical locations not easily accessible without such assistance. Exposure Time: Please see nurses notes. Contrast: Before injecting any contrast, we confirmed that the patient did not have an allergy to iodine, shellfish, or radiological contrast. Once satisfactory needle placement was completed at the desired level, radiological contrast was injected. Contrast injected under live fluoroscopy. No contrast complications. See chart for type and volume of contrast used. Fluoroscopic Guidance: I was personally present during the use of fluoroscopy. "Tunnel Vision Technique" used to obtain the best possible view of the target area. Parallax error corrected before commencing the procedure. "Direction-depth-direction" technique used to introduce the needle under continuous pulsed fluoroscopy. Once target was reached, antero-posterior,  oblique, and lateral fluoroscopic projection used confirm needle placement in all planes. Images permanently stored in EMR. Interpretation: I personally interpreted the imaging intraoperatively. Adequate needle placement confirmed in multiple planes. Appropriate spread of contrast into desired area was observed. No evidence of afferent or efferent intravascular uptake. No intrathecal or subarachnoid spread observed. Permanent images saved into the patient's record.  Antibiotic Prophylaxis:   Anti-infectives (From admission, onward)    None      Indication(s): None identified  Post-operative Assessment:  Post-procedure Vital Signs:  Pulse/HCG Rate: 61  Temp: (!) 96.7 F (35.9 C) Resp: 16 BP: 131/84 SpO2: 97 %  EBL: None  Complications: No immediate post-treatment complications observed by team, or reported by patient.  Note: The patient tolerated the entire procedure well. A repeat set of vitals were taken after the procedure and the patient was kept under observation following institutional policy, for this type of procedure. Post-procedural neurological assessment was performed, showing return to baseline, prior to discharge. The patient was provided with post-procedure discharge instructions, including a section on how to identify potential problems. Should any problems arise concerning this procedure, the patient was given instructions to immediately contact us, at any time, without hesitation. In any case, we plan to contact the patient by telephone for a follow-up status report regarding this interventional procedure.  Comments:  No additional relevant information.  Plan of Care   Imaging Orders         DG PAIN CLINIC C-ARM 1-60 MIN NO REPORT    Procedure Orders    No procedure(s) ordered today    Medications ordered for procedure: Meds ordered this encounter  Medications   iohexol (OMNIPAQUE) 180 MG/ML injection 10 mL    Must be Myelogram-compatible. If not available,  you may substitute with a water-soluble, non-ionic, hypoallergenic, myelogram-compatible radiological contrast medium.   lidocaine (XYLOCAINE) 2 % (with pres) injection 400 mg   ropivacaine (PF) 2 mg/mL (0.2%) (NAROPIN) injection 2 mL   sodium chloride flush (NS) 0.9 % injection 2 mL   dexamethasone (DECADRON) injection 10 mg   dexamethasone (DECADRON) injection 10 mg   ropivacaine (PF) 2 mg/mL (0.2%) (NAROPIN) injection  4 mL   Medications administered: We administered iohexol, lidocaine, ropivacaine (PF) 2 mg/mL (0.2%), sodium chloride flush, dexamethasone, dexamethasone, and ropivacaine (PF) 2 mg/mL (0.2%).  See the medical record for exact dosing, route, and time of administration.  New Prescriptions   No medications on file   Disposition: Discharge home  Discharge Date & Time: 02/10/2022; 1138 hrs.   Physician-requested Follow-up: Return for Keep sch. appt.  Future Appointments  Date Time Provider [place]  02/11/2022  3:15 PM [facility] INFUSION CHAIR 15 [facility] None  02/13/2022 10:45 AM [facility] LAB [facility] None  02/13/2022 11:00 AM [facility] INJECTION [facility] None  02/19/2022  3:15 PM [facility] INFUSION CHAIR 16 [facility] None  02/27/2022  2:15 PM [facility] LAB [facility] None  02/27/2022  2:30 PM [facility] INJECTION [facility] None  03/10/2022  1:00 PM [facility] None  03/12/2022  1:45 PM Earlie Server, MD [facility] None  03/12/2022  2:15 PM [facility] INFUSION CHAIR 5 [facility] None  03/17/2022  7:35 AM [facility] DEVICE REMOTES [facility]  03/20/2022  1:40 PM Juline Patch, MD [facility]  03/24/2022  2:40 PM [facility] NURSE HEALTH ADVISOR [facility]  04/15/2022  1:40 PM Gillis Santa, MD [facility] None  04/21/2022  7:35 AM [facility] DEVICE REMOTES [facility]  05/26/2022  7:35 AM [facility] DEVICE REMOTES [facility]   Primary Care Physician: Juline Patch, MD Location: [facility] Note by: Gillis Santa,  MD Date: 02/10/2022; Time: 1:18 PM  Disclaimer:  Medicine is not an exact science. The only guarantee in medicine is that nothing is guaranteed. It is important to note that the decision to proceed with this intervention was based on the information collected from the patient. The Data and conclusions were drawn from the patient's questionnaire, the interview, and the physical examination. Because the information was provided in large part by the patient, it cannot be guaranteed that it has not been purposely or unconsciously manipulated. Every effort has been made to obtain as much relevant data as possible for this evaluation. It is important to note that the conclusions that lead to this procedure are derived in large part from the available data. Always take into account that the treatment will also be dependent on availability of resources and existing treatment guidelines, considered by other Pain Management Practitioners as being common knowledge and practice, at the time of the intervention. For Medico-Legal purposes, it is also important to point out that variation in procedural techniques and pharmacological choices are the acceptable norm. The indications, contraindications, technique, and results of the above procedure should only be interpreted and judged by a Board-Certified Interventional Pain Specialist with extensive familiarity and expertise in the same exact procedure and technique.

## 2022-02-10 NOTE — Patient Instructions (Signed)

## 2022-02-10 NOTE — Progress Notes (Signed)
PROVIDER NOTE: Interpretation of information contained herein should be left to medically-trained personnel. Specific patient instructions are provided elsewhere under "Patient Instructions" section of medical record. This document was created in part using STT-dictation technology, any transcriptional errors that may result from this process are unintentional.  Patient: Michele Moore Type: Established DOB: 11-Feb-1951 MRN: [id number] PCP: Juline Patch, MD  Service: Procedure DOS: 02/10/2022 Setting: Ambulatory Location: Ambulatory outpatient facility Delivery: Face-to-face Provider: Gillis Santa, MD Specialty: Interventional Pain Management Specialty designation: 09 Location: Outpatient facility Ref. Prov.: Juline Patch, MD    Primary Reason for Visit: Interventional Pain Management Treatment. CC: Back Pain (Lumbar left ) and Shoulder Pain (Right )  Procedure:           Type: Suprascapular nerve block (SSNB) #1  Laterality:  Right Level: Superior to scapular spine, lateral to supraspinatus fossa (Suprascapular notch).  Imaging: Fluoroscopic guidance         Anesthesia: Local anesthesia (1-2% Lidocaine) DOS: 02/10/2022  Performed by: Gillis Santa, MD  Purpose: Diagnostic/Therapeutic Indications: Shoulder pain, severe enough to impact quality of life and/or function. 1. Primary osteoarthritis of right shoulder   2. Lumbar radiculopathy   3. Chronic pain syndrome    NAS-11 score:   Pre-procedure: 8 /10   Post-procedure: 0-No pain/10     Target: Suprascapular nerve Location: midway between the medial border of the scapula and the acromion as it runs through the suprascapular notch. Region: Suprascapular, posterior shoulder  Approach: Percutaneous  Neuroanatomy: The suprascapular nerve is the lateral branch of the superior trunk of the brachial plexus. It receives nerve fibers that originate in the nerve roots C5 and C6 (and sometimes C4). It is a mixed nerve, meaning that it  provides both sensory and motor supply for the suprascapular region. Function: The main function of this nerve is to provide motor innervation for two muscles, the supraspinatus and infraspinatus muscles. They are part of the rotator cuff muscles. In addition, the suprascapular nerve provides a sensory supply to the joints of the scapula (glenohumeral and acromioclavicular joints). Rationale (medical necessity): procedure needed and proper for the diagnosis and/or treatment of the patient's medical symptoms and needs.  Position / Prep / Materials:  Position: Prone Materials:  Tray: Block Needle(s):  Type: Spinal  Gauge (G): 25  Length: 3.5 in.  Qty: 1 Prep solution: DuraPrep (Iodine Povacrylex [0.7% available iodine] and Isopropyl Alcohol, 74% w/w) Prep Area: Entire posterior shoulder area. From upper spine to shoulder proper (upper arm), and from lateral neck to lower tip of shoulder blade.   Pre-op H&P Assessment:  Michele Moore is a 71 y.o. (year old), female patient, seen today for interventional treatment. She  has a past surgical history that includes Carpal tunnel release; Rotator cuff repair (Right, 1995); Ankle surgery (Right); Tubal ligation; Colonoscopy (2000?); Upper gi endoscopy (2000?); Tonsillectomy; Fusion of talonavicular joint (Right, 08/03/2015); Colonoscopy with propofol (N/A, 10/22/2017); Esophagogastroduodenoscopy (egd) with propofol (N/A, 10/22/2017); polypectomy (N/A, 10/22/2017); Total knee revision (Right, 01/20/2018); Joint replacement (Right); Loop recorder implant (08/22/2021); and Total knee revision (Left, 10/09/2021). Michele Moore has a current medication list which includes the following prescription(s): acetaminophen, albuterol, allopurinol, betamethasone dipropionate, betamethasone valerate, calcipotriene, vitamin d3, clobetasol ointment, diclofenac sodium, farxiga, furosemide, hydrocodone-acetaminophen, [START ON 02/21/2022] hydrocodone-acetaminophen, [START ON 03/23/2022]  hydrocodone-acetaminophen, ketoconazole, levothyroxine, losartan, meclizine, metoprolol tartrate, montelukast, multiple vitamins-calcium, mupirocin ointment, nystatin cream, omega-3 acid ethyl esters, omeprazole, otezla, polyethyl glycol-propyl glycol, potassium chloride sa, tizanidine, triamcinolone cream, gabapentin, tramadol, [DISCONTINUED] fluticasone, and [DISCONTINUED] mometasone. Her  primarily concern today is the Back Pain (Lumbar left ) and Shoulder Pain (Right )  Initial Vital Signs:  Pulse/HCG Rate: (!) 55  Temp: (!) 96.7 F (35.9 C) Resp: 16 BP: 98/69 SpO2: 97 %  BMI: Estimated body mass index is 33.47 kg/m as calculated from the following:   Height as of this encounter: '5\' 11"'$  (1.803 m).   Weight as of this encounter: 240 lb (108.9 kg).  Risk Assessment: Allergies: Reviewed. She is allergic to ampicillin, penicillins, vancomycin, and vibramycin [doxycycline calcium].  Allergy Precautions: None required Coagulopathies: Reviewed. None identified.  Blood-thinner therapy: None at this time Active Infection(s): Reviewed. None identified. Michele Moore is afebrile  Site Confirmation: Michele Moore was asked to confirm the procedure and laterality before marking the site Procedure checklist: Completed Consent: Before the procedure and under the influence of no sedative(s), amnesic(s), or anxiolytics, the patient was informed of the treatment options, risks and possible complications. To fulfill our ethical and legal obligations, as recommended by the American Medical Association's Code of Ethics, I have informed the patient of my clinical impression; the nature and purpose of the treatment or procedure; the risks, benefits, and possible complications of the intervention; the alternatives, including doing nothing; the risk(s) and benefit(s) of the alternative treatment(s) or procedure(s); and the risk(s) and benefit(s) of doing nothing. The patient was provided information about the general risks  and possible complications associated with the procedure. These may include, but are not limited to: failure to achieve desired goals, infection, bleeding, organ or nerve damage, allergic reactions, paralysis, and death. In addition, the patient was informed of those risks and complications associated to the procedure, such as failure to decrease pain; infection; bleeding; organ or nerve damage with subsequent damage to sensory, motor, and/or autonomic systems, resulting in permanent pain, numbness, and/or weakness of one or several areas of the body; allergic reactions; (i.e.: anaphylactic reaction); and/or death. Furthermore, the patient was informed of those risks and complications associated with the medications. These include, but are not limited to: allergic reactions (i.e.: anaphylactic or anaphylactoid reaction(s)); adrenal axis suppression; blood sugar elevation that in diabetics may result in ketoacidosis or comma; water retention that in patients with history of congestive heart failure may result in shortness of breath, pulmonary edema, and decompensation with resultant heart failure; weight gain; swelling or edema; medication-induced neural toxicity; particulate matter embolism and blood vessel occlusion with resultant organ, and/or nervous system infarction; and/or aseptic necrosis of one or more joints. Finally, the patient was informed that Medicine is not an exact science; therefore, there is also the possibility of unforeseen or unpredictable risks and/or possible complications that may result in a catastrophic outcome. The patient indicated having understood very clearly. We have given the patient no guarantees and we have made no promises. Enough time was given to the patient to ask questions, all of which were answered to the patient's satisfaction. Ms. Mahr has indicated that she wanted to continue with the procedure. Attestation: I, the ordering provider, attest that I have discussed with  the patient the benefits, risks, side-effects, alternatives, likelihood of achieving goals, and potential problems during recovery for the procedure that I have provided informed consent. Date  Time: 02/10/2022 10:42 AM  Pre-Procedure Preparation:  Monitoring: As per clinic protocol. Respiration, ETCO2, SpO2, BP, heart rate and rhythm monitor placed and checked for adequate function Safety Precautions: Patient was assessed for positional comfort and pressure points before starting the procedure. Time-out: I initiated and conducted the "Time-out" before starting  the procedure, as per protocol. The patient was asked to participate by confirming the accuracy of the "Time Out" information. Verification of the correct person, site, and procedure were performed and confirmed by me, the nursing staff, and the patient. "Time-out" conducted as per Joint Commission's Universal Protocol (UP.01.01.01). Time: 1127  Description of Procedure:          Procedural Technique Safety Precautions: Aspiration looking for blood return was conducted prior to all injections. At no point did we inject any substances, as a needle was being advanced. No attempts were made at seeking any paresthesias. Safe injection practices and needle disposal techniques used. Medications properly checked for expiration dates. SDV (single dose vial) medications used. Description of the Procedure: Protocol guidelines were followed. The patient was placed in position over the procedure table. The target area was identified and the area prepped in the usual manner. Skin & deeper tissues infiltrated with local anesthetic. Appropriate amount of time allowed to pass for local anesthetics to take effect. The procedure needles were then advanced to the target area. Proper needle placement secured. Negative aspiration confirmed. Solution injected in intermittent fashion, asking for systemic symptoms every 0.5cc of injectate. The needles were then removed  and the area cleansed, making sure to leave some of the prepping solution back to take advantage of its long term bactericidal properties.  Vitals:   02/10/22 1047 02/10/22 1115 02/10/22 1125 02/10/22 1135  BP: 98/69 118/68 128/86 131/84  Pulse: (!) 55 (!) 57 (!) 56 61  Resp: '16 15 16 16  '$ Temp: (!) 96.7 F (35.9 C)     TempSrc: Temporal     SpO2: 97% 95% 96% 97%  Weight: 240 lb (108.9 kg)     Height: '5\' 11"'$  (1.803 m)       5 cc solution made of 4 cc of 0.2% ropivacaine, 1 cc of Decadron 10 mg/cc. Injected after contrast confirmation for right SSN   Start Time: 1127 hrs. End Time: 1133 hrs.  Imaging Guidance (Spinal):          Type of Imaging Technique: Fluoroscopy Guidance (Spinal) Indication(s): Assistance in needle guidance and placement for procedures requiring needle placement in or near specific anatomical locations not easily accessible without such assistance. Exposure Time: Please see nurses notes. Contrast: None used. Fluoroscopic Guidance: I was personally present during the use of fluoroscopy. "Tunnel Vision Technique" used to obtain the best possible view of the target area. Parallax error corrected before commencing the procedure. "Direction-depth-direction" technique used to introduce the needle under continuous pulsed fluoroscopy. Once target was reached, antero-posterior, oblique, and lateral fluoroscopic projection used confirm needle placement in all planes. Images permanently stored in EMR. Interpretation: No contrast injected. I personally interpreted the imaging intraoperatively. Adequate needle placement confirmed in multiple planes. Permanent images saved into the patient's record.  Antibiotic Prophylaxis:   Anti-infectives (From admission, onward)    None      Indication(s): None identified  Post-operative Assessment:  Post-procedure Vital Signs:  Pulse/HCG Rate: 61  Temp: (!) 96.7 F (35.9 C) Resp: 16 BP: 131/84 SpO2: 97 %  EBL:  None  Complications: No immediate post-treatment complications observed by team, or reported by patient.  Note: The patient tolerated the entire procedure well. A repeat set of vitals were taken after the procedure and the patient was kept under observation following institutional policy, for this type of procedure. Post-procedural neurological assessment was performed, showing return to baseline, prior to discharge. The patient was provided with post-procedure discharge instructions,  including a section on how to identify potential problems. Should any problems arise concerning this procedure, the patient was given instructions to immediately contact us, at any time, without hesitation. In any case, we plan to contact the patient by telephone for a follow-up status report regarding this interventional procedure.  Comments:  No additional relevant information.  Plan of Care  Orders:  Orders Placed This Encounter  Procedures   DG PAIN CLIN[facility] C-ARM 1-60 MIN NO REPORT    Intraoperative interpretation by procedural physician at [facility].    Standing Status:   Standing    Number of Occurrences:   1    Order Specific Question:   Reason for exam:    Answer:   Assistance in needle guidance and placement for procedures requiring needle placement in or near specific anatomical locations not easily accessible without such assistance.   Chronic Opioid Analgesic:  Norco 7.5 mg TID PRN #90/month, 22.5    Medications ordered for procedure: Meds ordered this encounter  Medications   iohexol (OMNIPAQUE) 180 MG/ML injection 10 mL    Must be Myelogram-compatible. If not available, you may substitute with a water-soluble, non-ionic, hypoallergenic, myelogram-compatible radiological contrast medium.   lidocaine (XYLOCAINE) 2 % (with pres) injection 400 mg   ropivacaine (PF) 2 mg/mL (0.2%) (NAROPIN) injection 2 mL   sodium chloride flush (NS) 0.9 % injection 2 mL   dexamethasone (DECADRON)  injection 10 mg   dexamethasone (DECADRON) injection 10 mg   ropivacaine (PF) 2 mg/mL (0.2%) (NAROPIN) injection 4 mL   Medications administered: We administered iohexol, lidocaine, ropivacaine (PF) 2 mg/mL (0.2%), sodium chloride flush, dexamethasone, dexamethasone, and ropivacaine (PF) 2 mg/mL (0.2%).  See the medical record for exact dosing, route, and time of administration.  Follow-up plan:   Return for Keep sch. appt.      Recent Visits Date Type Provider Dept  12/31/21 Office Visit Gillis Santa, MD [facility]  Showing recent visits within past 90 days and meeting all other requirements Today's Visits Date Type Provider Dept  02/10/22 Procedure visit Gillis Santa, MD [facility]  Showing today's visits and meeting all other requirements Future Appointments Date Type Provider Dept  04/15/22 Appointment Gillis Santa, MD [facility]  Showing future appointments within next 90 days and meeting all other requirements  Disposition: Discharge home  Discharge (Date  Time): 02/10/2022; 1138 hrs.   Primary Care Physician: Juline Patch, MD Location: [facility] Note by: Gillis Santa, MD Date: 02/10/2022; Time: 1:20 PM  Disclaimer:  Medicine is not an exact science. The only guarantee in medicine is that nothing is guaranteed. It is important to note that the decision to proceed with this intervention was based on the information collected from the patient. The Data and conclusions were drawn from the patient's questionnaire, the interview, and the physical examination. Because the information was provided in large part by the patient, it cannot be guaranteed that it has not been purposely or unconsciously manipulated. Every effort has been made to obtain as much relevant data as possible for this evaluation. It is important to note that the conclusions that lead to this procedure are derived in large part from the  available data. Always take into account that the treatment will also be dependent on availability of resources and existing treatment guidelines, considered by other Pain Management Practitioners as being common knowledge and practice, at the time of the intervention. For Medico-Legal purposes, it is also important to  point out that variation in procedural techniques and pharmacological choices are the acceptable norm. The indications, contraindications, technique, and results of the above procedure should only be interpreted and judged by a Board-Certified Interventional Pain Specialist with extensive familiarity and expertise in the same exact procedure and technique.

## 2022-02-11 ENCOUNTER — Telehealth: Payer: Self-pay | Admitting: Student in an Organized Health Care Education/Training Program

## 2022-02-11 ENCOUNTER — Telehealth: Payer: Self-pay

## 2022-02-11 ENCOUNTER — Inpatient Hospital Stay: Payer: Medicare Other

## 2022-02-11 VITALS — BP 136/83 | HR 64 | Temp 97.2°F

## 2022-02-11 DIAGNOSIS — N183 Chronic kidney disease, stage 3 unspecified: Secondary | ICD-10-CM

## 2022-02-11 DIAGNOSIS — D649 Anemia, unspecified: Secondary | ICD-10-CM

## 2022-02-11 DIAGNOSIS — N189 Chronic kidney disease, unspecified: Secondary | ICD-10-CM | POA: Diagnosis not present

## 2022-02-11 DIAGNOSIS — I129 Hypertensive chronic kidney disease with stage 1 through stage 4 chronic kidney disease, or unspecified chronic kidney disease: Secondary | ICD-10-CM | POA: Diagnosis not present

## 2022-02-11 DIAGNOSIS — D631 Anemia in chronic kidney disease: Secondary | ICD-10-CM | POA: Diagnosis not present

## 2022-02-11 DIAGNOSIS — Z79899 Other long term (current) drug therapy: Secondary | ICD-10-CM | POA: Diagnosis not present

## 2022-02-11 LAB — CUP PACEART REMOTE DEVICE CHECK
Date Time Interrogation Session: 20231217231121
Implantable Pulse Generator Implant Date: 20230629

## 2022-02-11 MED ORDER — SODIUM CHLORIDE 0.9 % IV SOLN
200.0000 mg | Freq: Once | INTRAVENOUS | Status: AC
  Administered 2022-02-11: 200 mg via INTRAVENOUS
  Filled 2022-02-11: qty 200

## 2022-02-11 MED ORDER — SODIUM CHLORIDE 0.9 % IV SOLN
Freq: Once | INTRAVENOUS | Status: AC
  Filled 2022-02-11: qty 250

## 2022-02-11 NOTE — Telephone Encounter (Signed)
Attempted to call patient.  LM 

## 2022-02-11 NOTE — Telephone Encounter (Signed)
PT stated that had an headache early. PT stated that when she look in the mirror before going out and under both of her eyes are swollen and both cheeks. PT asked if someone will call her back. Thanks

## 2022-02-11 NOTE — Telephone Encounter (Signed)
Post procedure follow up.  LM 

## 2022-02-12 NOTE — Telephone Encounter (Signed)
Advised patient that symptoms may be associated with steroids administered, probably no need for concern.

## 2022-02-13 ENCOUNTER — Inpatient Hospital Stay: Payer: Medicare Other

## 2022-02-13 VITALS — BP 108/55 | HR 46

## 2022-02-13 DIAGNOSIS — Z79899 Other long term (current) drug therapy: Secondary | ICD-10-CM | POA: Diagnosis not present

## 2022-02-13 DIAGNOSIS — D631 Anemia in chronic kidney disease: Secondary | ICD-10-CM | POA: Diagnosis not present

## 2022-02-13 DIAGNOSIS — N189 Chronic kidney disease, unspecified: Secondary | ICD-10-CM | POA: Diagnosis not present

## 2022-02-13 DIAGNOSIS — D649 Anemia, unspecified: Secondary | ICD-10-CM

## 2022-02-13 DIAGNOSIS — N183 Chronic kidney disease, stage 3 unspecified: Secondary | ICD-10-CM

## 2022-02-13 DIAGNOSIS — I129 Hypertensive chronic kidney disease with stage 1 through stage 4 chronic kidney disease, or unspecified chronic kidney disease: Secondary | ICD-10-CM | POA: Diagnosis not present

## 2022-02-13 LAB — CBC WITH DIFFERENTIAL/PLATELET
Abs Immature Granulocytes: 0.03 10*3/uL (ref 0.00–0.07)
Basophils Absolute: 0.1 10*3/uL (ref 0.0–0.1)
Basophils Relative: 1 %
Eosinophils Absolute: 0.1 10*3/uL (ref 0.0–0.5)
Eosinophils Relative: 1 %
HCT: 27.8 % — ABNORMAL LOW (ref 36.0–46.0)
Hemoglobin: 8.7 g/dL — ABNORMAL LOW (ref 12.0–15.0)
Immature Granulocytes: 0 %
Lymphocytes Relative: 19 %
Lymphs Abs: 1.5 10*3/uL (ref 0.7–4.0)
MCH: 27.5 pg (ref 26.0–34.0)
MCHC: 31.3 g/dL (ref 30.0–36.0)
MCV: 88 fL (ref 80.0–100.0)
Monocytes Absolute: 0.5 10*3/uL (ref 0.1–1.0)
Monocytes Relative: 7 %
Neutro Abs: 5.5 10*3/uL (ref 1.7–7.7)
Neutrophils Relative %: 72 %
Platelets: 195 10*3/uL (ref 150–400)
RBC: 3.16 MIL/uL — ABNORMAL LOW (ref 3.87–5.11)
RDW: 18.9 % — ABNORMAL HIGH (ref 11.5–15.5)
WBC: 7.6 10*3/uL (ref 4.0–10.5)
nRBC: 0 % (ref 0.0–0.2)

## 2022-02-13 MED ORDER — EPOETIN ALFA-EPBX 10000 UNIT/ML IJ SOLN
20000.0000 [IU] | Freq: Once | INTRAMUSCULAR | Status: AC
  Administered 2022-02-13: 20000 [IU] via SUBCUTANEOUS
  Filled 2022-02-13: qty 2

## 2022-02-13 NOTE — Progress Notes (Signed)
Patient feels no more weak than usual.  Drinking plenty of water  BP 108/55, HR 46  Patient denies symptoms of low bp or HR.  Did take Losartan and Metoprolol this morning.    Patient does have a pacemaker that is electronically monitored by Dr. Caryl Comes.  Dr. Tasia Catchings notified.

## 2022-02-18 MED FILL — Iron Sucrose Inj 20 MG/ML (Fe Equiv): INTRAVENOUS | Qty: 10 | Status: AC

## 2022-02-19 ENCOUNTER — Inpatient Hospital Stay: Payer: Medicare Other

## 2022-02-19 VITALS — BP 113/53 | HR 54 | Temp 98.0°F | Resp 18

## 2022-02-19 DIAGNOSIS — I129 Hypertensive chronic kidney disease with stage 1 through stage 4 chronic kidney disease, or unspecified chronic kidney disease: Secondary | ICD-10-CM | POA: Diagnosis not present

## 2022-02-19 DIAGNOSIS — D631 Anemia in chronic kidney disease: Secondary | ICD-10-CM | POA: Diagnosis not present

## 2022-02-19 DIAGNOSIS — N189 Chronic kidney disease, unspecified: Secondary | ICD-10-CM | POA: Diagnosis not present

## 2022-02-19 DIAGNOSIS — D649 Anemia, unspecified: Secondary | ICD-10-CM

## 2022-02-19 DIAGNOSIS — Z79899 Other long term (current) drug therapy: Secondary | ICD-10-CM | POA: Diagnosis not present

## 2022-02-19 DIAGNOSIS — N183 Chronic kidney disease, stage 3 unspecified: Secondary | ICD-10-CM

## 2022-02-19 MED ORDER — SODIUM CHLORIDE 0.9 % IV SOLN
200.0000 mg | Freq: Once | INTRAVENOUS | Status: AC
  Administered 2022-02-19: 200 mg via INTRAVENOUS
  Filled 2022-02-19: qty 200

## 2022-02-19 MED ORDER — SODIUM CHLORIDE 0.9 % IV SOLN
INTRAVENOUS | Status: DC
  Filled 2022-02-19: qty 250

## 2022-02-19 NOTE — Progress Notes (Signed)
Carelink Summary Report / Loop Recorder 

## 2022-02-27 ENCOUNTER — Inpatient Hospital Stay: Payer: Medicare Other | Attending: Oncology

## 2022-02-27 ENCOUNTER — Inpatient Hospital Stay: Payer: Medicare Other

## 2022-02-27 VITALS — BP 119/71

## 2022-02-27 DIAGNOSIS — M069 Rheumatoid arthritis, unspecified: Secondary | ICD-10-CM | POA: Diagnosis not present

## 2022-02-27 DIAGNOSIS — I700 Atherosclerosis of aorta: Secondary | ICD-10-CM | POA: Diagnosis not present

## 2022-02-27 DIAGNOSIS — Z87442 Personal history of urinary calculi: Secondary | ICD-10-CM | POA: Insufficient documentation

## 2022-02-27 DIAGNOSIS — Z79899 Other long term (current) drug therapy: Secondary | ICD-10-CM | POA: Diagnosis not present

## 2022-02-27 DIAGNOSIS — M3500 Sicca syndrome, unspecified: Secondary | ICD-10-CM | POA: Insufficient documentation

## 2022-02-27 DIAGNOSIS — Z86010 Personal history of colonic polyps: Secondary | ICD-10-CM | POA: Diagnosis not present

## 2022-02-27 DIAGNOSIS — L4050 Arthropathic psoriasis, unspecified: Secondary | ICD-10-CM | POA: Insufficient documentation

## 2022-02-27 DIAGNOSIS — R809 Proteinuria, unspecified: Secondary | ICD-10-CM | POA: Diagnosis not present

## 2022-02-27 DIAGNOSIS — Z87891 Personal history of nicotine dependence: Secondary | ICD-10-CM | POA: Diagnosis not present

## 2022-02-27 DIAGNOSIS — N1832 Chronic kidney disease, stage 3b: Secondary | ICD-10-CM | POA: Insufficient documentation

## 2022-02-27 DIAGNOSIS — D631 Anemia in chronic kidney disease: Secondary | ICD-10-CM | POA: Diagnosis not present

## 2022-02-27 DIAGNOSIS — Z7984 Long term (current) use of oral hypoglycemic drugs: Secondary | ICD-10-CM | POA: Insufficient documentation

## 2022-02-27 DIAGNOSIS — E782 Mixed hyperlipidemia: Secondary | ICD-10-CM | POA: Insufficient documentation

## 2022-02-27 DIAGNOSIS — E039 Hypothyroidism, unspecified: Secondary | ICD-10-CM | POA: Diagnosis not present

## 2022-02-27 DIAGNOSIS — Z791 Long term (current) use of non-steroidal anti-inflammatories (NSAID): Secondary | ICD-10-CM | POA: Diagnosis not present

## 2022-02-27 DIAGNOSIS — Z79890 Hormone replacement therapy (postmenopausal): Secondary | ICD-10-CM | POA: Insufficient documentation

## 2022-02-27 DIAGNOSIS — M5136 Other intervertebral disc degeneration, lumbar region: Secondary | ICD-10-CM | POA: Diagnosis not present

## 2022-02-27 DIAGNOSIS — N183 Chronic kidney disease, stage 3 unspecified: Secondary | ICD-10-CM

## 2022-02-27 DIAGNOSIS — D649 Anemia, unspecified: Secondary | ICD-10-CM

## 2022-02-27 DIAGNOSIS — K219 Gastro-esophageal reflux disease without esophagitis: Secondary | ICD-10-CM | POA: Diagnosis not present

## 2022-02-27 DIAGNOSIS — I130 Hypertensive heart and chronic kidney disease with heart failure and stage 1 through stage 4 chronic kidney disease, or unspecified chronic kidney disease: Secondary | ICD-10-CM | POA: Insufficient documentation

## 2022-02-27 LAB — CBC WITH DIFFERENTIAL/PLATELET
Abs Immature Granulocytes: 0.02 10*3/uL (ref 0.00–0.07)
Basophils Absolute: 0 10*3/uL (ref 0.0–0.1)
Basophils Relative: 1 %
Eosinophils Absolute: 0.5 10*3/uL (ref 0.0–0.5)
Eosinophils Relative: 9 %
HCT: 25.9 % — ABNORMAL LOW (ref 36.0–46.0)
Hemoglobin: 8.1 g/dL — ABNORMAL LOW (ref 12.0–15.0)
Immature Granulocytes: 0 %
Lymphocytes Relative: 13 %
Lymphs Abs: 0.8 10*3/uL (ref 0.7–4.0)
MCH: 26.4 pg (ref 26.0–34.0)
MCHC: 31.3 g/dL (ref 30.0–36.0)
MCV: 84.4 fL (ref 80.0–100.0)
Monocytes Absolute: 0.5 10*3/uL (ref 0.1–1.0)
Monocytes Relative: 7 %
Neutro Abs: 4.4 10*3/uL (ref 1.7–7.7)
Neutrophils Relative %: 70 %
Platelets: 240 10*3/uL (ref 150–400)
RBC: 3.07 MIL/uL — ABNORMAL LOW (ref 3.87–5.11)
RDW: 17.2 % — ABNORMAL HIGH (ref 11.5–15.5)
WBC: 6.3 10*3/uL (ref 4.0–10.5)
nRBC: 0 % (ref 0.0–0.2)

## 2022-02-27 MED ORDER — EPOETIN ALFA-EPBX 10000 UNIT/ML IJ SOLN
20000.0000 [IU] | Freq: Once | INTRAMUSCULAR | Status: AC
  Administered 2022-02-27: 20000 [IU] via SUBCUTANEOUS
  Filled 2022-02-27: qty 2

## 2022-03-04 DIAGNOSIS — Z96653 Presence of artificial knee joint, bilateral: Secondary | ICD-10-CM | POA: Diagnosis not present

## 2022-03-04 DIAGNOSIS — Z96652 Presence of left artificial knee joint: Secondary | ICD-10-CM | POA: Diagnosis not present

## 2022-03-05 ENCOUNTER — Other Ambulatory Visit: Payer: Self-pay

## 2022-03-05 DIAGNOSIS — D631 Anemia in chronic kidney disease: Secondary | ICD-10-CM | POA: Diagnosis not present

## 2022-03-05 DIAGNOSIS — N2581 Secondary hyperparathyroidism of renal origin: Secondary | ICD-10-CM | POA: Diagnosis not present

## 2022-03-05 DIAGNOSIS — I10 Essential (primary) hypertension: Secondary | ICD-10-CM | POA: Diagnosis not present

## 2022-03-05 DIAGNOSIS — N184 Chronic kidney disease, stage 4 (severe): Secondary | ICD-10-CM | POA: Diagnosis not present

## 2022-03-05 DIAGNOSIS — E039 Hypothyroidism, unspecified: Secondary | ICD-10-CM

## 2022-03-05 MED ORDER — LEVOTHYROXINE SODIUM 150 MCG PO TABS: 150.0000 ug | ORAL_TABLET | Freq: Every day | ORAL | 0 refills | Status: DC

## 2022-03-10 ENCOUNTER — Inpatient Hospital Stay: Payer: Medicare Other

## 2022-03-10 DIAGNOSIS — Z79899 Other long term (current) drug therapy: Secondary | ICD-10-CM | POA: Diagnosis not present

## 2022-03-10 DIAGNOSIS — N1832 Chronic kidney disease, stage 3b: Secondary | ICD-10-CM | POA: Diagnosis not present

## 2022-03-10 DIAGNOSIS — D631 Anemia in chronic kidney disease: Secondary | ICD-10-CM | POA: Diagnosis not present

## 2022-03-10 DIAGNOSIS — M3500 Sicca syndrome, unspecified: Secondary | ICD-10-CM | POA: Diagnosis not present

## 2022-03-10 DIAGNOSIS — N183 Chronic kidney disease, stage 3 unspecified: Secondary | ICD-10-CM

## 2022-03-10 DIAGNOSIS — M069 Rheumatoid arthritis, unspecified: Secondary | ICD-10-CM | POA: Diagnosis not present

## 2022-03-10 DIAGNOSIS — I130 Hypertensive heart and chronic kidney disease with heart failure and stage 1 through stage 4 chronic kidney disease, or unspecified chronic kidney disease: Secondary | ICD-10-CM | POA: Diagnosis not present

## 2022-03-10 LAB — CBC WITH DIFFERENTIAL/PLATELET
Abs Immature Granulocytes: 0.06 10*3/uL (ref 0.00–0.07)
Basophils Absolute: 0 10*3/uL (ref 0.0–0.1)
Basophils Relative: 0 %
Eosinophils Absolute: 0 10*3/uL (ref 0.0–0.5)
Eosinophils Relative: 0 %
HCT: 27.7 % — ABNORMAL LOW (ref 36.0–46.0)
Hemoglobin: 8.5 g/dL — ABNORMAL LOW (ref 12.0–15.0)
Immature Granulocytes: 1 %
Lymphocytes Relative: 11 %
Lymphs Abs: 1.1 10*3/uL (ref 0.7–4.0)
MCH: 27.1 pg (ref 26.0–34.0)
MCHC: 30.7 g/dL (ref 30.0–36.0)
MCV: 88.2 fL (ref 80.0–100.0)
Monocytes Absolute: 0.2 10*3/uL (ref 0.1–1.0)
Monocytes Relative: 2 %
Neutro Abs: 8.4 10*3/uL — ABNORMAL HIGH (ref 1.7–7.7)
Neutrophils Relative %: 86 %
Platelets: 267 10*3/uL (ref 150–400)
RBC: 3.14 MIL/uL — ABNORMAL LOW (ref 3.87–5.11)
RDW: 18.9 % — ABNORMAL HIGH (ref 11.5–15.5)
WBC: 9.7 10*3/uL (ref 4.0–10.5)
nRBC: 0 % (ref 0.0–0.2)

## 2022-03-10 LAB — IRON AND TIBC
Iron: 83 ug/dL (ref 28–170)
Saturation Ratios: 30 % (ref 10.4–31.8)
TIBC: 280 ug/dL (ref 250–450)
UIBC: 197 ug/dL

## 2022-03-10 LAB — FERRITIN: Ferritin: 119 ng/mL (ref 11–307)

## 2022-03-11 MED FILL — Iron Sucrose Inj 20 MG/ML (Fe Equiv): INTRAVENOUS | Qty: 10 | Status: AC

## 2022-03-12 ENCOUNTER — Inpatient Hospital Stay: Payer: Medicare Other

## 2022-03-12 ENCOUNTER — Inpatient Hospital Stay (HOSPITAL_BASED_OUTPATIENT_CLINIC_OR_DEPARTMENT_OTHER): Payer: Medicare Other | Admitting: Oncology

## 2022-03-12 ENCOUNTER — Other Ambulatory Visit: Payer: Medicare Other

## 2022-03-12 ENCOUNTER — Encounter: Payer: Self-pay | Admitting: Oncology

## 2022-03-12 VITALS — BP 130/76 | HR 55 | Temp 97.4°F | Resp 18 | Wt 244.7 lb

## 2022-03-12 DIAGNOSIS — N1832 Chronic kidney disease, stage 3b: Secondary | ICD-10-CM | POA: Diagnosis not present

## 2022-03-12 DIAGNOSIS — M069 Rheumatoid arthritis, unspecified: Secondary | ICD-10-CM

## 2022-03-12 DIAGNOSIS — Z79899 Other long term (current) drug therapy: Secondary | ICD-10-CM | POA: Diagnosis not present

## 2022-03-12 DIAGNOSIS — M3500 Sicca syndrome, unspecified: Secondary | ICD-10-CM | POA: Diagnosis not present

## 2022-03-12 DIAGNOSIS — N183 Chronic kidney disease, stage 3 unspecified: Secondary | ICD-10-CM | POA: Diagnosis not present

## 2022-03-12 DIAGNOSIS — D631 Anemia in chronic kidney disease: Secondary | ICD-10-CM | POA: Diagnosis not present

## 2022-03-12 DIAGNOSIS — D649 Anemia, unspecified: Secondary | ICD-10-CM

## 2022-03-12 DIAGNOSIS — I130 Hypertensive heart and chronic kidney disease with heart failure and stage 1 through stage 4 chronic kidney disease, or unspecified chronic kidney disease: Secondary | ICD-10-CM | POA: Diagnosis not present

## 2022-03-12 MED ORDER — EPOETIN ALFA-EPBX 10000 UNIT/ML IJ SOLN
30000.0000 [IU] | Freq: Once | INTRAMUSCULAR | Status: AC
  Administered 2022-03-12: 30000 [IU] via SUBCUTANEOUS
  Filled 2022-03-12: qty 3

## 2022-03-12 NOTE — Progress Notes (Signed)
Pt here for follow up. No new concerns voiced.   

## 2022-03-12 NOTE — Assessment & Plan Note (Signed)
Labs reviewed and discussed with patient.  Hemoglobin has improved to 8.4 Proceed with Retacrit 30000 units today Ferritin <200  recommend IV Venofer weekly x 3 to further increase iron stores Monitor labs every 2 weeks +/- Retacrit if hemoglobin is less than 10. Follow-up with gastroenterology for workup- she prefers to defer for now.

## 2022-03-12 NOTE — Assessment & Plan Note (Signed)
Encourage oral hydration and avoid nephrotoxins.   

## 2022-03-12 NOTE — Progress Notes (Signed)
Hematology/Oncology Progress note [phone] [phone]       Clinic Day:  03/12/2022  ASSESSMENT & PLAN:   Anemia due to stage 3b chronic kidney disease (Michele Moore) Labs reviewed and discussed with patient.  Hemoglobin has improved to 8.4 Proceed with Retacrit 30000 units today Ferritin <200  recommend IV Venofer weekly x 3 to further increase iron stores Monitor labs every 2 weeks +/- Retacrit if hemoglobin is less than 10. Follow-up with gastroenterology for workup- she prefers to defer for now.   Rheumatoid arthritis (HCC) #Rheumatoid arthritis on methotrexate.  Continue folic acid 3 days/week.    CKD (chronic kidney disease), stage III (Michele Moore) Encourage oral hydration and avoid nephrotoxins.    Orders Placed This Encounter  Procedures   CBC with Differential/Platelet    Standing Status:   Future    Standing Expiration Date:   03/12/2023   Iron and TIBC([facility])    Standing Status:   Future    Standing Expiration Date:   03/13/2023   Ferritin    Standing Status:   Future    Standing Expiration Date:   03/13/2023   Retic Panel    Standing Status:   Future    Standing Expiration Date:   03/13/2023   Multiple Myeloma Panel (SPEP&IFE w/QIG)    Standing Status:   Future    Standing Expiration Date:   03/12/2023   Hemoglobin and Hematocrit, Blood    Standing Status:   Future    Standing Expiration Date:   03/13/2023   Hemoglobin and Hematocrit, Blood    Standing Status:   Future    Standing Expiration Date:   03/13/2023   Hemoglobin and Hematocrit, Blood    Standing Status:   Future    Standing Expiration Date:   03/13/2023   Hemoglobin and Hematocrit, Blood    Standing Status:   Future    Standing Expiration Date:   03/13/2023   Hemoglobin and Hematocrit, Blood    Standing Status:   Future    Standing Expiration Date:   03/13/2023    Follow-up appointment Per LOS  All questions were answered. The patient knows to call the clinic with any  problems, questions or concerns.  Michele Server, MD, PhD [facility] Hematology Oncology 03/12/2022    Chief Complaint: Michele Moore is a 72 y.o. female presents for anemia in stage IIIb chronic kidney disease   Michele Moore is a 72 y.o.afemale who has above oncology history reviewed by me today presented for follow up visit for management of anemia in CKD  PERTINENT HEMATOLOGY HISTORY Patient previously followed up by Dr.Corcoran, patient switched care to me on 08/30/20 Extensive medical record review was performed by me  # anemia in stage IIIb chronic kidney disease.  Work-up on 04/23/2020 revealed a hematocrit of 35.0, hemoglobin 11.4, MCV 92.3, platelets 168,000, WBC 4,600 with an ANC of 3000. Ferritin was 107 with an iron saturation of 37% and a TIBC of 315. Sed rate was 54 and CRP was 0.9.  Vitamin B12 was 429 and folate >100.0.   10/22/2017 Colonoscopy for heme positive stool revealed one 7 mm polyp in the ascending colon, one 3 mm polyp in the transverse colon, and three 1 to 4 mm polyps in the sigmoid colon. There were non-bleeding internal hemorrhoids. There were petechia(e) in the rectum. Pathology showed two tubular adenomas and one hyperplastic polyp. EGD on 10/22/2017 showed gastritis and one gastric polyp.  She has Sjogren's syndrome, rheumatoid arthritis,  hypertension, and proteinuria.  She takes oral iron once a day.  Patient follows up with pain clinic for pain management.  She previously tolerated IV Venofer treatments.  INTERVAL HISTORY Michele Moore is a 72 y.o. female who has above history reviewed by me today presents for follow up visit for anemia secondary to chronic kidney disease. Fatigue has improved.  Patient has received Venofer treatments and Venofer treatments.no new complaints.    Past Medical History:  Diagnosis Date   (HFpEF) heart failure with preserved ejection fraction ([place]) 01/13/2014   a.) TTE 01/13/2014: EF 45%, apical HK,  mod LVH, mild MAC, mild LAE/RVE, mod MR/TR/PR; b.) TTE 10/01/2018: EF 55-60%, mild MVH, mild LAE, triv MR/TR, G1DD   Allergy    ANA positive    Anemia of chronic renal failure, stage 3 (moderate), unspecified whether stage 3a or 3b CKD (HCC)    Aortic atherosclerosis (HCC)    Asthma    Cataract    Chronic pain syndrome    a.) followed by [facility] Michele Raring, MD)   Chronic, continuous use of opioids    CKD (chronic kidney disease), stage III ([place])    Complication of anesthesia    a.) anesthesia awareness/premature emergence during colonoscopy   DDD (degenerative disc disease), lumbar    Dyspnea on exertion    Dysrhythmia    IRREGULAR HEART BEAT   Family history of adverse reaction to anesthesia    sister difficult to put to sleep   GERD (gastroesophageal reflux disease)    Glaucoma    Gout    H/O tooth extraction    all lower teeth 1/19   Hemorrhoid    History of hiatal hernia    History of kidney stones    Hypertension    Hypothyroidism    Implantable loop recorder present 08/22/2021   a.) s/p implantation of [employer] LINQ Reveal ILR; serial #: [id number]   Long term current use of immunosuppressive drug    a.) MTX + apremilast   Migraines    Mixed hyperlipidemia    Multiple gastric ulcers    Osteoarthritis    Psoriasis    Psoriatic arthritis ([place])    a.) on apremilast   Rheumatoid arthritis ([place])    a.) on MTX   SS-A antibody positive    Wears dentures    partial upper and lower    Past Surgical History:  Procedure Laterality Date   ANKLE SURGERY Right    CARPAL TUNNEL RELEASE     x3   COLONOSCOPY  2000?   COLONOSCOPY WITH PROPOFOL N/A 10/22/2017   Procedure: COLONOSCOPY WITH PROPOFOL;  Surgeon: Michele Lame, MD;  Location: [place];  Service: Endoscopy;  Laterality: N/A;   ESOPHAGOGASTRODUODENOSCOPY (EGD) WITH PROPOFOL N/A 10/22/2017   Procedure: ESOPHAGOGASTRODUODENOSCOPY (EGD) WITH PROPOFOL;  Surgeon: Michele Lame, MD;  Location: [place];  Service: Endoscopy;  Laterality: N/A;   FUSION OF TALONAVICULAR JOINT Right 08/03/2015   Procedure: TAILOR NAVICULAR JOINT FUSION - RIGHT ;  Surgeon: Michele Moore, DPM;  Location: [facility];  Service: Podiatry;  Laterality: Right;   JOINT REPLACEMENT Right    knee x 3 ,right x1 and left x2   LOOP RECORDER IMPLANT  08/22/2021   Procedure: LOOP RECORDER IMPLANT; Location: [place]; Surgeon: Virl Axe, MD   POLYPECTOMY N/A 10/22/2017   Procedure: POLYPECTOMY;  Surgeon: Michele Lame, MD;  Location: [place];  Service: Endoscopy;  Laterality: N/A;   ROTATOR CUFF REPAIR Right 1995   TONSILLECTOMY  TOTAL KNEE REVISION Right 01/20/2018   Procedure: POLYETHYLENE EXCHANGE;  Surgeon: Dereck Leep, MD;  Location: [facility];  Service: Orthopedics;  Laterality: Right;   TOTAL KNEE REVISION Left 10/09/2021   Procedure: LEFT TOTAL KNEE REVISION WITH POLYETHYLENE EXCHANGE.;  Surgeon: Dereck Leep, MD;  Location: [facility];  Service: Orthopedics;  Laterality: Left;   TUBAL LIGATION     UPPER GI ENDOSCOPY  2000?    Family History  Problem Relation Age of Onset   Heart failure Mother    Gout Mother    Arthritis Mother    Hypertension Mother    Stroke Mother    Heart failure Father    Diabetes Father    Hyperlipidemia Father    COPD Sister    Cancer Maternal Aunt        breast   Cancer Maternal Uncle        kidney   Breast cancer Neg Hx     Social History:  reports that she quit smoking about 29 years ago. Her smoking use included cigarettes. She has a 40.00 pack-year smoking history. She has never used smokeless tobacco. She reports that she does not drink alcohol and does not use drugs.  Allergies:  Allergies  Allergen Reactions   Ampicillin Swelling   Penicillins Anaphylaxis and Swelling    IgE = 10 (11/05/2017)  Has patient had a PCN reaction causing immediate rash, facial/tongue/throat swelling, SOB or lightheadedness with hypotension: Yes Has patient had  a PCN reaction causing severe rash involving mucus membranes or skin necrosis: No Has patient had a PCN reaction that required hospitalization: No Has patient had a PCN reaction occurring within the last 10 years: Yes If all of the above answers are "NO", then may proceed with Cephalosporin use.   Vancomycin Rash    Other reaction(s): Other (see comments)   Vibramycin [Doxycycline Calcium] Rash    Current Medications: Current Outpatient Medications  Medication Sig Dispense Refill   acetaminophen (TYLENOL) 650 MG CR tablet Take 650 mg by mouth every 8 (eight) hours as needed for pain.     albuterol (VENTOLIN HFA) 108 (90 Base) MCG/ACT inhaler Inhale 2 puffs into the lungs every 6 (six) hours as needed for wheezing or shortness of breath. 8 g 2   allopurinol (ZYLOPRIM) 100 MG tablet Take 200 mg by mouth every morning.      betamethasone dipropionate 0.05 % cream APPLY TO PSORIASIS ON HANDS TWICE DAILY ONLY. AVOID FACE, GROIN AND AXILLA 45 g 0   betamethasone valerate (VALISONE) 0.1 % cream Apply topically 2 (two) times daily as needed (Rash). 45 g 3   calcipotriene (DOVONOX) 0.005 % cream APPLY TOPICALLY TO PSORIASIS AREAS ON LEGS AND FEET ONCE OR TWICE DAILY 540 g 1   Cholecalciferol (VITAMIN D3) 2000 units TABS Take 2,000 Units by mouth daily.     clobetasol ointment (TEMOVATE) 0.05 % Apply topically 2 (two) times daily.     diclofenac sodium (VOLTAREN) 1 % GEL Apply 2 g topically 3 (three) times daily.      FARXIGA 10 MG TABS tablet Take 10 mg by mouth every morning.     furosemide (LASIX) 40 MG tablet Take 40 mg by mouth daily.     gabapentin (NEURONTIN) 600 MG tablet Take 1 tablet (600 mg total) by mouth at bedtime. 30 tablet 5   HYDROcodone-acetaminophen (NORCO) 7.5-325 MG tablet Take 1 tablet by mouth every 8 (eight) hours as needed for severe pain. Must last 30 days. [place]  tablet 0   [START ON 03/23/2022] HYDROcodone-acetaminophen (NORCO) 7.5-325 MG tablet Take 1 tablet by mouth every 8  (eight) hours as needed for severe pain. Must last 30 days. 90 tablet 0   ketoconazole (NIZORAL) 2 % cream Apply topically daily as needed. 15 g 2   levothyroxine (SYNTHROID) 150 MCG tablet Take 1 tablet (150 mcg total) by mouth daily. 30 tablet 0   losartan (COZAAR) 25 MG tablet Take 25 mg by mouth daily.     meclizine (ANTIVERT) 12.5 MG tablet TAKE 1 TABLET(12.5 MG) BY MOUTH THREE TIMES DAILY AS NEEDED FOR DIZZINESS 30 tablet 1   metoprolol tartrate (LOPRESSOR) 25 MG tablet Take 1 tablet (25 mg total) by mouth 2 (two) times daily. 180 tablet 2   montelukast (SINGULAIR) 10 MG tablet TAKE 1 TABLET(10 MG) BY MOUTH AT BEDTIME 90 tablet 1   Multiple Vitamins-Calcium (ONE-A-DAY WOMENS FORMULA PO) Take 1 tablet by mouth daily.     mupirocin ointment (BACTROBAN) 2 % Apply 1 application topically 2 (two) times daily. 22 g 0   nystatin cream (MYCOSTATIN) Apply 1 Application topically 2 (two) times daily. 30 g 0   omega-3 acid ethyl esters (LOVAZA) 1 g capsule Take 1 capsule (1 g total) by mouth 2 (two) times daily. 180 capsule 1   omeprazole (PRILOSEC) 40 MG capsule TAKE 1 CAPSULE(40 MG) BY MOUTH DAILY 90 capsule 1   OTEZLA 30 MG TABS Take 1 tablet by mouth daily.     Polyethyl Glycol-Propyl Glycol (SYSTANE OP) Place 1 drop into both eyes daily as needed (dry eyes).     potassium chloride SA (KLOR-CON M) 20 MEQ tablet Take 1 tablet (20 mEq total) by mouth daily. 90 tablet 1   tiZANidine (ZANAFLEX) 4 MG tablet Take 1 tablet (4 mg total) by mouth at bedtime. 60 tablet 5   triamcinolone (KENALOG) 0.1 % Apply 1 application topically 2 (two) times daily. 30 g 0   traMADol (ULTRAM) 50 MG tablet Take 1-2 tablets (50-100 mg total) by mouth every 4 (four) hours as needed for moderate pain. (Patient not taking: Reported on 01/28/2022) 30 tablet 0   No current facility-administered medications for this visit.    Review of Systems  Constitutional:  Negative for chills, diaphoresis, fever, malaise/fatigue and  weight loss.  HENT:  Negative for congestion, ear discharge, ear pain, hearing loss, nosebleeds, sinus pain, sore throat and tinnitus.   Eyes:        Glaucoma  Respiratory:  Negative for cough, hemoptysis, sputum production and shortness of breath (on exertion, due to asthma).   Cardiovascular:  Positive for leg swelling (right ankle). Negative for chest pain and palpitations.  Gastrointestinal:  Negative for abdominal pain, blood in stool, constipation, diarrhea, heartburn, melena, nausea and vomiting.  Genitourinary:  Negative for dysuria, frequency, hematuria and urgency.  Musculoskeletal:  Positive for joint pain (arthritis). Negative for back pain, myalgias and neck pain.       Status post knee replacement  Skin:  Negative for itching and rash.  Neurological:  Positive for sensory change (foot numbness s/p surgery). Negative for dizziness, tingling, weakness and headaches.  Endo/Heme/Allergies:  Does not bruise/bleed easily.  Psychiatric/Behavioral:  Negative for depression and memory loss. The patient is not nervous/anxious and does not have insomnia.   All other systems reviewed and are negative.   Performance status (ECOG): 1-2  Vitals Blood pressure 130/76, pulse (!) 55, temperature (!) 97.4 F (36.3 C), resp. rate 18, weight 244 lb 11.2 oz (111  kg).   Physical Exam Vitals and nursing note reviewed.  Constitutional:      General: She is not in acute distress.    Appearance: She is not diaphoretic.  Eyes:     General: No scleral icterus.    Conjunctiva/sclera: Conjunctivae normal.  Neurological:     Mental Status: She is alert and oriented to person, place, and time.  Psychiatric:        Behavior: Behavior normal.        Thought Content: Thought content normal.        Judgment: Judgment normal.    Labs    Latest Ref Rng & Units 03/10/2022    1:21 PM 02/27/2022    2:02 PM 02/13/2022    1:14 PM  CBC  WBC 4.0 - 10.5 K/uL 9.7  6.3  7.6   Hemoglobin 12.0 - 15.0 g/dL 8.5   8.1  8.7   Hematocrit 36.0 - 46.0 % 27.7  25.9  27.8   Platelets 150 - 400 K/uL 267  240  195       Latest Ref Rng & Units 01/08/2022    3:31 PM 12/18/2021    7:48 PM 11/20/2021    1:23 PM  CMP  Glucose 70 - 99 mg/dL 103  110  100   BUN 8 - 23 mg/dL 35  31  36   Creatinine 0.44 - 1.00 mg/dL 2.07  2.28  2.62   Sodium 135 - 145 mmol/L 138  138  138   Potassium 3.5 - 5.1 mmol/L 3.9  4.0  3.7   Chloride 98 - 111 mmol/L 102  105  102   CO2 22 - 32 mmol/L _0 Calcium 8.9 - 10.3 mg/dL 9.8  10.1  10.2   Total Protein 6.5 - 8.1 g/dL 7.4   6.8   Total Bilirubin 0.3 - 1.2 mg/dL 0.4   0.4   Alkaline Phos 38 - 126 U/L 53   67   AST 15 - 41 U/L 21   22   ALT 0 - 44 U/L 10   11

## 2022-03-12 NOTE — Assessment & Plan Note (Signed)
#  Rheumatoid arthritis on methotrexate.  Continue folic acid 3 days/week.

## 2022-03-13 ENCOUNTER — Other Ambulatory Visit
Admission: RE | Admit: 2022-03-13 | Discharge: 2022-03-13 | Disposition: A | Discharge status: Home / Self Care | Payer: Medicare Other | Source: Ambulatory Visit | Attending: Sports Medicine | Admitting: Sports Medicine

## 2022-03-13 DIAGNOSIS — M25562 Pain in left knee: Secondary | ICD-10-CM | POA: Diagnosis not present

## 2022-03-13 DIAGNOSIS — G8929 Other chronic pain: Secondary | ICD-10-CM | POA: Diagnosis not present

## 2022-03-13 DIAGNOSIS — M25462 Effusion, left knee: Secondary | ICD-10-CM | POA: Diagnosis not present

## 2022-03-13 DIAGNOSIS — Z96652 Presence of left artificial knee joint: Secondary | ICD-10-CM | POA: Diagnosis not present

## 2022-03-13 LAB — SYNOVIAL CELL COUNT + DIFF, W/ CRYSTALS
Crystals, Fluid: NONE SEEN
Lymphocytes-Synovial Fld: 6 % (ref 0–20)
Monocyte-Macrophage-Synovial Fluid: 4 % — ABNORMAL LOW (ref 50–90)
Neutrophil, Synovial: 90 % — ABNORMAL HIGH (ref 0–25)
WBC, Synovial: 19446 /mm3 — ABNORMAL HIGH (ref 0–200)

## 2022-03-17 ENCOUNTER — Ambulatory Visit: Payer: Medicare Other | Attending: Cardiovascular Disease

## 2022-03-17 DIAGNOSIS — R002 Palpitations: Secondary | ICD-10-CM

## 2022-03-19 LAB — CUP PACEART REMOTE DEVICE CHECK
Date Time Interrogation Session: 20240119231229
Implantable Pulse Generator Implant Date: 20230629

## 2022-03-20 ENCOUNTER — Ambulatory Visit: Payer: Medicare Other | Admitting: Family Medicine

## 2022-03-20 NOTE — Progress Notes (Signed)
Carelink Summary Report / Loop Recorder

## 2022-03-21 ENCOUNTER — Inpatient Hospital Stay: Payer: Medicare Other

## 2022-03-21 DIAGNOSIS — N1832 Chronic kidney disease, stage 3b: Secondary | ICD-10-CM | POA: Diagnosis not present

## 2022-03-21 DIAGNOSIS — N183 Chronic kidney disease, stage 3 unspecified: Secondary | ICD-10-CM

## 2022-03-21 DIAGNOSIS — M069 Rheumatoid arthritis, unspecified: Secondary | ICD-10-CM | POA: Diagnosis not present

## 2022-03-21 DIAGNOSIS — D631 Anemia in chronic kidney disease: Secondary | ICD-10-CM | POA: Diagnosis not present

## 2022-03-21 DIAGNOSIS — I130 Hypertensive heart and chronic kidney disease with heart failure and stage 1 through stage 4 chronic kidney disease, or unspecified chronic kidney disease: Secondary | ICD-10-CM | POA: Diagnosis not present

## 2022-03-21 DIAGNOSIS — D649 Anemia, unspecified: Secondary | ICD-10-CM

## 2022-03-21 DIAGNOSIS — M3500 Sicca syndrome, unspecified: Secondary | ICD-10-CM | POA: Diagnosis not present

## 2022-03-21 DIAGNOSIS — Z79899 Other long term (current) drug therapy: Secondary | ICD-10-CM | POA: Diagnosis not present

## 2022-03-21 MED ORDER — SODIUM CHLORIDE 0.9 % IV SOLN
INTRAVENOUS | Status: DC
  Filled 2022-03-21: qty 250

## 2022-03-21 MED ORDER — SODIUM CHLORIDE 0.9 % IV SOLN
200.0000 mg | Freq: Once | INTRAVENOUS | Status: AC
  Administered 2022-03-21: 200 mg via INTRAVENOUS
  Filled 2022-03-21: qty 10

## 2022-03-21 NOTE — Patient Instructions (Signed)
Iron Sucrose Injection What is this medication? IRON SUCROSE (EYE ern SOO krose) treats low levels of iron (iron deficiency anemia) in people with kidney disease. Iron is a mineral that plays an important role in making red blood cells, which carry oxygen from your lungs to the rest of your body. This medicine may be used for other purposes; ask your health care provider or pharmacist if you have questions. COMMON BRAND NAME(S): Venofer What should I tell my care team before I take this medication? They need to know if you have any of these conditions: Anemia not caused by low iron levels Heart disease High levels of iron in the blood Kidney disease Liver disease An unusual or allergic reaction to iron, other medications, foods, dyes, or preservatives Pregnant or trying to get pregnant Breastfeeding How should I use this medication? This medication is for infusion into a vein. It is given in a hospital or clinic setting. Talk to your care team about the use of this medication in children. While this medication may be prescribed for children as young as 2 years for selected conditions, precautions do apply. Overdosage: If you think you have taken too much of this medicine contact a poison control center or emergency room at once. NOTE: This medicine is only for you. Do not share this medicine with others. What if I miss a dose? Keep appointments for follow-up doses. It is important not to miss your dose. Call your care team if you are unable to keep an appointment. What may interact with this medication? Do not take this medication with any of the following: Deferoxamine Dimercaprol Other iron products This medication may also interact with the following: Chloramphenicol Deferasirox This list may not describe all possible interactions. Give your health care provider a list of all the medicines, herbs, non-prescription drugs, or dietary supplements you use. Also tell them if you smoke,  drink alcohol, or use illegal drugs. Some items may interact with your medicine. What should I watch for while using this medication? Visit your care team regularly. Tell your care team if your symptoms do not start to get better or if they get worse. You may need blood work done while you are taking this medication. You may need to follow a special diet. Talk to your care team. Foods that contain iron include: whole grains/cereals, dried fruits, beans, or peas, leafy green vegetables, and organ meats (liver, kidney). What side effects may I notice from receiving this medication? Side effects that you should report to your care team as soon as possible: Allergic reactions--skin rash, itching, hives, swelling of the face, lips, tongue, or throat Low blood pressure--dizziness, feeling faint or lightheaded, blurry vision Shortness of breath Side effects that usually do not require medical attention (report to your care team if they continue or are bothersome): Flushing Headache Joint pain Muscle pain Nausea Pain, redness, or irritation at injection site This list may not describe all possible side effects. Call your doctor for medical advice about side effects. You may report side effects to FDA at 1-800-FDA-1088. Where should I keep my medication? This medication is given in a hospital or clinic and will not be stored at home. NOTE: This sheet is a summary. It may not cover all possible information. If you have questions about this medicine, talk to your doctor, pharmacist, or health care provider.  2023 Elsevier/Gold Standard (2020-05-24 00:00:00)

## 2022-03-24 ENCOUNTER — Ambulatory Visit (INDEPENDENT_AMBULATORY_CARE_PROVIDER_SITE_OTHER): Payer: Medicare Other

## 2022-03-24 DIAGNOSIS — Z0000 Encounter for general adult medical examination without abnormal findings: Secondary | ICD-10-CM

## 2022-03-24 NOTE — Progress Notes (Signed)
I connected with  Michele Moore on 03/24/22 by a audio enabled telemedicine application and verified that I am speaking with the correct person using two identifiers.  Patient Location: Home  Provider Location: Office/Clinic  I discussed the limitations of evaluation and management by telemedicine. The patient expressed understanding and agreed to proceed.  Subjective:   Michele Moore is a 72 y.o. female who presents for Medicare Annual (Subsequent) preventive examination.  Review of Systems    Per HPI unless specifically indicated below.  Cardiac Risk Factors include: advanced age (>86mn, >>108women);female gender          Objective:       03/12/2022    2:45 PM 02/27/2022    2:25 PM 02/19/2022    4:04 PM  Vitals with BMI  Weight 244 lbs 11 oz    Systolic [id number] Diastolic 76 71 53  Pulse 55  54    Today's Vitals   03/24/22 1448  PainSc: 8    There is no height or weight on file to calculate BMI.     03/21/2022    3:00 PM 03/12/2022    2:40 PM 01/28/2022    3:00 PM 01/28/2022    2:31 PM 01/08/2022    3:31 PM 12/18/2021    2:44 PM 10/10/2021   10:00 PM  Advanced Directives  Does Patient Have a Medical Advance Directive? No No No No No No No  Would patient like information on creating a medical advance directive? No - Patient declined No - Patient declined No - Patient declined No - Patient declined No - Patient declined  No - Patient declined    Current Medications (verified) Outpatient Encounter Medications as of 03/24/2022  Medication Sig   acetaminophen (TYLENOL) 650 MG CR tablet Take 650 mg by mouth every 8 (eight) hours as needed for pain.   albuterol (VENTOLIN HFA) 108 (90 Base) MCG/ACT inhaler Inhale 2 puffs into the lungs every 6 (six) hours as needed for wheezing or shortness of breath.   allopurinol (ZYLOPRIM) 100 MG tablet Take 200 mg by mouth every morning.    betamethasone dipropionate 0.05 % cream APPLY TO PSORIASIS ON HANDS TWICE DAILY ONLY. AVOID  FACE, GROIN AND AXILLA   betamethasone valerate (VALISONE) 0.1 % cream Apply topically 2 (two) times daily as needed (Rash).   calcipotriene (DOVONOX) 0.005 % cream APPLY TOPICALLY TO PSORIASIS AREAS ON LEGS AND FEET ONCE OR TWICE DAILY   Cholecalciferol (VITAMIN D3) 2000 units TABS Take 2,000 Units by mouth daily.   clobetasol ointment (TEMOVATE) 0.05 % Apply topically 2 (two) times daily.   diclofenac sodium (VOLTAREN) 1 % GEL Apply 2 g topically 3 (three) times daily.    FARXIGA 10 MG TABS tablet Take 10 mg by mouth every morning.   furosemide (LASIX) 40 MG tablet Take 40 mg by mouth daily.   gabapentin (NEURONTIN) 600 MG tablet Take 1 tablet (600 mg total) by mouth at bedtime.   HYDROcodone-acetaminophen (NORCO) 7.5-325 MG tablet Take 1 tablet by mouth every 8 (eight) hours as needed for severe pain. Must last 30 days.   ketoconazole (NIZORAL) 2 % cream Apply topically daily as needed.   levothyroxine (SYNTHROID) 150 MCG tablet Take 1 tablet (150 mcg total) by mouth daily.   losartan (COZAAR) 25 MG tablet Take 25 mg by mouth daily.   meclizine (ANTIVERT) 12.5 MG tablet TAKE 1 TABLET(12.5 MG) BY MOUTH THREE TIMES DAILY AS NEEDED FOR DIZZINESS   metoprolol  tartrate (LOPRESSOR) 25 MG tablet Take 1 tablet (25 mg total) by mouth 2 (two) times daily.   montelukast (SINGULAIR) 10 MG tablet TAKE 1 TABLET(10 MG) BY MOUTH AT BEDTIME   Multiple Vitamins-Calcium (ONE-A-DAY WOMENS FORMULA PO) Take 1 tablet by mouth daily.   mupirocin ointment (BACTROBAN) 2 % Apply 1 application topically 2 (two) times daily.   nystatin cream (MYCOSTATIN) Apply 1 Application topically 2 (two) times daily.   omega-3 acid ethyl esters (LOVAZA) 1 g capsule Take 1 capsule (1 g total) by mouth 2 (two) times daily.   omeprazole (PRILOSEC) 40 MG capsule TAKE 1 CAPSULE(40 MG) BY MOUTH DAILY   OTEZLA 30 MG TABS Take 1 tablet by mouth daily.   Polyethyl Glycol-Propyl Glycol (SYSTANE OP) Place 1 drop into both eyes daily as needed  (dry eyes).   potassium chloride SA (KLOR-CON M) 20 MEQ tablet Take 1 tablet (20 mEq total) by mouth daily.   tiZANidine (ZANAFLEX) 4 MG tablet Take 1 tablet (4 mg total) by mouth at bedtime.   traMADol (ULTRAM) 50 MG tablet Take 1-2 tablets (50-100 mg total) by mouth every 4 (four) hours as needed for moderate pain.   triamcinolone (KENALOG) 0.1 % Apply 1 application topically 2 (two) times daily.   [DISCONTINUED] fluticasone (FLONASE) 50 MCG/ACT nasal spray SHAKE LIQUID AND USE 1 SPRAY IN EACH NOSTRIL DAILY   [DISCONTINUED] mometasone (NASONEX) 50 MCG/ACT nasal spray Place 2 sprays into the nose daily.   No facility-administered encounter medications on file as of 03/24/2022.    Allergies (verified) Ampicillin, Penicillins, Vancomycin, and Vibramycin [doxycycline calcium]   History: Past Medical History:  Diagnosis Date   (HFpEF) heart failure with preserved ejection fraction ([place]) 01/13/2014   a.) TTE 01/13/2014: EF 45%, apical HK, mod LVH, mild MAC, mild LAE/RVE, mod MR/TR/PR; b.) TTE 10/01/2018: EF 55-60%, mild MVH, mild LAE, triv MR/TR, G1DD   Allergy    ANA positive    Anemia of chronic renal failure, stage 3 (moderate), unspecified whether stage 3a or 3b CKD (HCC)    Aortic atherosclerosis (HCC)    Asthma    Cataract    Chronic pain syndrome    a.) followed by [facility] Holley Raring, MD)   Chronic, continuous use of opioids    CKD (chronic kidney disease), stage III ([place])    Complication of anesthesia    a.) anesthesia awareness/premature emergence during colonoscopy   DDD (degenerative disc disease), lumbar    Dyspnea on exertion    Dysrhythmia    IRREGULAR HEART BEAT   Family history of adverse reaction to anesthesia    sister difficult to put to sleep   GERD (gastroesophageal reflux disease)    Glaucoma    Gout    H/O tooth extraction    all lower teeth 1/19   Hemorrhoid    History of hiatal hernia    History of kidney stones    Hypertension     Hypothyroidism    Implantable loop recorder present 08/22/2021   a.) s/p implantation of [employer] LINQ Reveal ILR; serial #: [id number]   Long term current use of immunosuppressive drug    a.) MTX + apremilast   Migraines    Mixed hyperlipidemia    Multiple gastric ulcers    Osteoarthritis    Psoriasis    Psoriatic arthritis ([place])    a.) on apremilast   Rheumatoid arthritis ([place])    a.) on MTX   SS-A antibody positive    Wears dentures  partial upper and lower   Past Surgical History:  Procedure Laterality Date   ANKLE SURGERY Right    CARPAL TUNNEL RELEASE     x3   COLONOSCOPY  2000?   COLONOSCOPY WITH PROPOFOL N/A 10/22/2017   Procedure: COLONOSCOPY WITH PROPOFOL;  Surgeon: Lucilla Lame, MD;  Location: [place];  Service: Endoscopy;  Laterality: N/A;   ESOPHAGOGASTRODUODENOSCOPY (EGD) WITH PROPOFOL N/A 10/22/2017   Procedure: ESOPHAGOGASTRODUODENOSCOPY (EGD) WITH PROPOFOL;  Surgeon: Lucilla Lame, MD;  Location: [place];  Service: Endoscopy;  Laterality: N/A;   FUSION OF TALONAVICULAR JOINT Right 08/03/2015   Procedure: TAILOR NAVICULAR JOINT FUSION - RIGHT ;  Surgeon: [name], DPM;  Location: [facility];  Service: Podiatry;  Laterality: Right;   JOINT REPLACEMENT Right    knee x 3 ,right x1 and left x2   LOOP RECORDER IMPLANT  08/22/2021   Procedure: LOOP RECORDER IMPLANT; Location: [place]; Surgeon: Virl Axe, MD   POLYPECTOMY N/A 10/22/2017   Procedure: POLYPECTOMY;  Surgeon: Lucilla Lame, MD;  Location: [place];  Service: Endoscopy;  Laterality: N/A;   ROTATOR CUFF REPAIR Right 1995   TONSILLECTOMY     TOTAL KNEE REVISION Right 01/20/2018   Procedure: POLYETHYLENE EXCHANGE;  Surgeon: Dereck Leep, MD;  Location: [facility];  Service: Orthopedics;  Laterality: Right;   TOTAL KNEE REVISION Left 10/09/2021   Procedure: LEFT TOTAL KNEE REVISION WITH POLYETHYLENE EXCHANGE.;  Surgeon: Dereck Leep, MD;  Location: [facility];  Service:  Orthopedics;  Laterality: Left;   TUBAL LIGATION     UPPER GI ENDOSCOPY  2000?   Family History  Problem Relation Age of Onset   Heart failure Mother    Gout Mother    Arthritis Mother    Hypertension Mother    Stroke Mother    Heart failure Father    Diabetes Father    Hyperlipidemia Father    COPD Sister    Cancer Maternal Aunt        breast   Cancer Maternal Uncle        kidney   Breast cancer Neg Hx    Social History   Socioeconomic History   Marital status: Divorced    Spouse name: Not on file   Number of children: 4   Years of education: Not on file   Highest education level: 9th grade  Occupational History   Occupation: retired  Tobacco Use   Smoking status: Former    Packs/day: 2.00    Years: 20.00    Total pack years: 40.00    Types: Cigarettes    Quit date: 02/24/1993    Years since quitting: 29.0   Smokeless tobacco: Never  Vaping Use   Vaping Use: Never used  Substance and Sexual Activity   Alcohol use: No    Alcohol/week: 0.0 standard drinks of alcohol   Drug use: No   Sexual activity: Not Currently  Other Topics Concern   Not on file  Social History Narrative   Pt lives alone   Social Determinants of Health   Financial Resource Strain: Low Risk  (03/24/2022)   Overall Financial Resource Strain (CARDIA)    Difficulty of Paying Living Expenses: Not hard at all  Food Insecurity: No Food Insecurity (03/24/2022)   Hunger Vital Sign    Worried About Running Out of Food in the Last Year: Never true    Ran Out of Food in the Last Year: Never true  Transportation Needs: No Transportation Needs (03/24/2022)  PRAPARE - Hydrologist (Medical): No    Lack of Transportation (Non-Medical): No  Physical Activity: Inactive (03/24/2022)   Exercise Vital Sign    Days of Exercise per Week: 0 days    Minutes of Exercise per Session: 0 min  Stress: No Stress Concern Present (03/24/2022)   [place]    Feeling of Stress : Not at all  Social Connections: Moderately Integrated (03/24/2022)   Social Connection and Isolation Panel [NHANES]    Frequency of Communication with Friends and Family: More than three times a week    Frequency of Social Gatherings with Friends and Family: Twice a week    Attends Religious Services: More than 4 times per year    Active Member of [employer] or Organizations: Yes    Attends Music therapist: More than 4 times per year    Marital Status: Divorced    Tobacco Counseling Counseling given: Not Answered   Clinical Intake:  Pre-visit preparation completed: No  Pain : 0-10 Pain Score: 8  Pain Type: Chronic pain Pain Location: Knee Pain Descriptors / Indicators: Aching Pain Frequency: Constant     Nutritional Status: BMI > 30  Obese Nutritional Risks: None Diabetes: No     Diabetic?No  Interpreter Needed?: No  Information entered by :: Donnie Mesa, CMA   Activities of Daily Living    03/24/2022    2:46 PM 10/10/2021   10:09 PM  In your present state of health, do you have any difficulty performing the following activities:  Hearing? 0   Vision? 1   Comment glaucoma   Difficulty concentrating or making decisions? 0   Walking or climbing stairs? 0   Dressing or bathing? 0   Doing errands, shopping? 0 0    Patient Care Team: Mealor, Yetta Barre, MD as PCP - General (Cardiology) Anthonette Legato, MD (Nephrology) Gillis Santa, MD as Consulting Physician (Pain Medicine) Hooten, Laurice Record, MD (Orthopedic Surgery) Earlie Server, MD as Consulting Physician (Oncology) Quintin Alto, MD as Consulting Physician (Rheumatology) [name], DPM as Referring Physician (Podiatry)  Indicate any recent Medical Services you may have received from other than [facility] providers in the past year (date may be approximate). The pt was seen at [facility] on 03/05/2022 for Chronic kidney  disease stage 4.     Assessment:   This is a routine wellness examination for Michele Moore.  Hearing/Vision screen Denies any hearing issues. Admits to vision issues. Overdue for an Annual vision screenings by [facility]   Dietary issues and exercise activities discussed: Current Exercise Habits: The patient does not participate in regular exercise at present, Exercise limited by: None identified   Goals Addressed   None    Depression Screen    03/24/2022    2:45 PM 11/25/2021    2:52 PM 09/16/2021    1:42 PM 05/14/2021   11:31 AM 03/20/2021    2:46 PM 02/27/2021   10:01 AM 02/12/2021    8:22 AM  PHQ 2/9 Scores  PHQ - 2 Score 0 0 0 0 0 0 0  PHQ- 9 Score  0 6        Fall Risk    03/24/2022    2:45 PM 12/31/2021    3:09 PM 11/25/2021    2:52 PM 09/16/2021    1:42 PM 08/06/2021    2:15 PM  Fall Risk   Falls in the past  year? 0 0 1 1 0  Number falls in past yr: 0  0 0   Injury with Fall? 0  0 1   Risk for fall due to : No Fall Risks Impaired balance/gait;Impaired mobility Impaired mobility;Impaired balance/gait;History of fall(s) History of fall(s);Impaired balance/gait   Follow up Falls evaluation completed Falls evaluation completed;Education provided Falls evaluation completed;Falls prevention discussed Falls evaluation completed     FALL RISK PREVENTION PERTAINING TO THE HOME:  Any stairs in or around the home? No  If so, are there any without handrails? No  Home free of loose throw rugs in walkways, pet beds, electrical cords, etc? Yes  Adequate lighting in your home to reduce risk of falls? Yes   ASSISTIVE DEVICES UTILIZED TO PREVENT FALLS:  Life alert? No  Use of a cane, walker or w/c? Yes  Grab bars in the bathroom? Yes  Shower chair or bench in shower? Yes  Elevated toilet seat or a handicapped toilet? Yes   TIMED UP AND GO:  Was the test performed?  unable to perform, virtual appt .    Cognitive Function:        03/24/2022    2:46 PM 03/09/2019    1:38  PM 03/01/2018    4:22 PM  6CIT Screen  What Year? 0 points 0 points 0 points  What month? 0 points 0 points 0 points  What time? 0 points 0 points 0 points  Count back from 20 0 points 0 points 0 points  Months in reverse 0 points 0 points 0 points  Repeat phrase 0 points 0 points 2 points  Total Score 0 points 0 points 2 points    Immunizations Immunization History  Administered Date(s) Administered   Fluad Quad(high Dose 65+) 10/19/2018, 12/08/2019, 03/19/2021   Influenza, High Dose Seasonal PF 12/23/2016   Influenza-Unspecified 11/07/2014, 12/23/2016   PFIZER(Purple Top)SARS-COV-2 Vaccination 05/20/2019, 06/17/2019   PNEUMOCOCCAL CONJUGATE-20 09/16/2021   Pneumococcal Conjugate-13 11/07/2014   Pneumococcal Polysaccharide-23 12/23/2016   Zoster, Live 11/07/2014    TDAP status: Due, Education has been provided regarding the importance of this vaccine. Advised may receive this vaccine at local pharmacy or Health Dept. Aware to provide a copy of the vaccination record if obtained from local pharmacy or Health Dept. Verbalized acceptance and understanding.  Flu Vaccine status: Due, Education has been provided regarding the importance of this vaccine. Advised may receive this vaccine at local pharmacy or Health Dept. Aware to provide a copy of the vaccination record if obtained from local pharmacy or Health Dept. Verbalized acceptance and understanding.  Pneumococcal vaccine status: Up to date  Covid-19 vaccine status: Information provided on how to obtain vaccines.   Qualifies for Shingles Vaccine? Yes   Zostavax completed Yes   Shingrix Completed?: No.    Education has been provided regarding the importance of this vaccine. Patient has been advised to call insurance company to determine out of pocket expense if they have not yet received this vaccine. Advised may also receive vaccine at local pharmacy or Health Dept. Verbalized acceptance and understanding.  Screening Tests Health  Maintenance  Topic Date Due   Hepatitis C Screening  Never done   DTaP/Tdap/Td (1 - Tdap) Never done   Zoster Vaccines- Shingrix (1 of 2) Never done   COVID-19 Vaccine (3 - Pfizer risk series) 07/15/2019   INFLUENZA VACCINE  05/25/2022 (Originally 09/24/2021)   MAMMOGRAM  09/17/2022 (Originally 04/05/2021)   COLONOSCOPY (Pts 45-28yr Insurance coverage will need to be confirmed)  10/23/2022  Medicare Annual Wellness (AWV)  03/25/2023   Pneumonia Vaccine 31+ Years old  Completed   DEXA SCAN  Completed   HPV VACCINES  Aged Out    Health Maintenance  Health Maintenance Due  Topic Date Due   Hepatitis C Screening  Never done   DTaP/Tdap/Td (1 - Tdap) Never done   Zoster Vaccines- Shingrix (1 of 2) Never done   COVID-19 Vaccine (3 - Pfizer risk series) 07/15/2019    Colorectal cancer screening: Type of screening: Colonoscopy. Completed 10/22/2017. Repeat every 5 years  Mammogram: Postponed   DEXA scan: 04/06/2019  Lung Cancer Screening: (Low Dose CT Chest recommended if Age 63-80 years, 30 pack-year currently smoking OR have quit w/in 15years.) does not qualify.   Lung Cancer Screening Referral: not applicable   Additional Screening:  Hepatitis C Screening: does qualify; overdue   Vision Screening: Recommended annual ophthalmology exams for early detection of glaucoma and other disorders of the eye. Is the patient up to date with their annual eye exam?  No  Who is the provider or what is the name of the office in which the patient attends annual eye exams?  If pt is not established with a provider, would they like to be referred to a provider to establish care? No .   Dental Screening: Recommended annual dental exams for proper oral hygiene  Community Resource Referral / Chronic Care Management: CRR required this visit?  No   CCM required this visit?  No      Plan:     I have personally reviewed and noted the following in the patient's chart:   Medical and social  history Use of alcohol, tobacco or illicit drugs  Current medications and supplements including opioid prescriptions. Patient is not currently taking opioid prescriptions. Functional ability and status Nutritional status Physical activity Advanced directives List of other physicians Hospitalizations, surgeries, and ER visits in previous 12 months Vitals Screenings to include cognitive, depression, and falls Referrals and appointments  In addition, I have reviewed and discussed with patient certain preventive protocols, quality metrics, and best practice recommendations. A written personalized care plan for preventive services as well as general preventive health recommendations were provided to patient.    Michele Moore , Thank you for taking time to come for your Medicare Wellness Visit. I appreciate your ongoing commitment to your health goals. Please review the following plan we discussed and let me know if I can assist you in the future.   These are the goals we discussed:  Goals      Increase physical activity     Increase physical activity as tolerated.        This is a list of the screening recommended for you and due dates:  Health Maintenance  Topic Date Due   Hepatitis C Screening: USPSTF Recommendation to screen - Ages 51-79 yo.  Never done   DTaP/Tdap/Td vaccine (1 - Tdap) Never done   Zoster (Shingles) Vaccine (1 of 2) Never done   COVID-19 Vaccine (3 - Pfizer risk series) 07/15/2019   Flu Shot  05/25/2022*   Mammogram  09/17/2022*   Colon Cancer Screening  10/23/2022   Medicare Annual Wellness Visit  03/25/2023   Pneumonia Vaccine  Completed   DEXA scan (bone density measurement)  Completed   HPV Vaccine  Aged Out  *Topic was postponed. The date shown is not the original due date.     Wilson Singer, [place]   03/24/2022   Nurse Notes: Approximately  30 minute Non-Face -To-Face Medicare Wellness Visit

## 2022-03-24 NOTE — Patient Instructions (Signed)
Health Maintenance, Female Adopting a healthy lifestyle and getting preventive care are important in promoting health and wellness. Ask your health care provider about: The right schedule for you to have regular tests and exams. Things you can do on your own to prevent diseases and keep yourself healthy. What should I know about diet, weight, and exercise? Eat a healthy diet  Eat a diet that includes plenty of vegetables, fruits, low-fat dairy products, and lean protein. Do not eat a lot of foods that are high in solid fats, added sugars, or sodium. Maintain a healthy weight Body mass index (BMI) is used to identify weight problems. It estimates body fat based on height and weight. Your health care provider can help determine your BMI and help you achieve or maintain a healthy weight. Get regular exercise Get regular exercise. This is one of the most important things you can do for your health. Most adults should: Exercise for at least 150 minutes each week. The exercise should increase your heart rate and make you sweat (moderate-intensity exercise). Do strengthening exercises at least twice a week. This is in addition to the moderate-intensity exercise. Spend less time sitting. Even light physical activity can be beneficial. Watch cholesterol and blood lipids Have your blood tested for lipids and cholesterol at 74 years of age, then have this test every 5 years. Have your cholesterol levels checked more often if: Your lipid or cholesterol levels are high. You are older than 74 years of age. You are at high risk for heart disease. What should I know about cancer screening? Depending on your health history and family history, you may need to have cancer screening at various ages. This may include screening for: Breast cancer. Cervical cancer. Colorectal cancer. Skin cancer. Lung cancer. What should I know about heart disease, diabetes, and high blood pressure? Blood pressure and heart  disease High blood pressure causes heart disease and increases the risk of stroke. This is more likely to develop in people who have high blood pressure readings or are overweight. Have your blood pressure checked: Every 3-5 years if you are 18-39 years of age. Every year if you are 40 years old or older. Diabetes Have regular diabetes screenings. This checks your fasting blood sugar level. Have the screening done: Once every three years after age 40 if you are at a normal weight and have a low risk for diabetes. More often and at a younger age if you are overweight or have a high risk for diabetes. What should I know about preventing infection? Hepatitis B If you have a higher risk for hepatitis B, you should be screened for this virus. Talk with your health care provider to find out if you are at risk for hepatitis B infection. Hepatitis C Testing is recommended for: Everyone born from 1945 through 1965. Anyone with known risk factors for hepatitis C. Sexually transmitted infections (STIs) Get screened for STIs, including gonorrhea and chlamydia, if: You are sexually active and are younger than 74 years of age. You are older than 74 years of age and your health care provider tells you that you are at risk for this type of infection. Your sexual activity has changed since you were last screened, and you are at increased risk for chlamydia or gonorrhea. Ask your health care provider if you are at risk. Ask your health care provider about whether you are at high risk for HIV. Your health care provider may recommend a prescription medicine to help prevent HIV   infection. If you choose to take medicine to prevent HIV, you should first get tested for HIV. You should then be tested every 3 months for as long as you are taking the medicine. Pregnancy If you are about to stop having your period (premenopausal) and you may become pregnant, seek counseling before you get pregnant. Take 400 to 800  micrograms (mcg) of folic acid every day if you become pregnant. Ask for birth control (contraception) if you want to prevent pregnancy. Osteoporosis and menopause Osteoporosis is a disease in which the bones lose minerals and strength with aging. This can result in bone fractures. If you are 97 years old or older, or if you are at risk for osteoporosis and fractures, ask your health care provider if you should: Be screened for bone loss. Take a calcium or vitamin D supplement to lower your risk of fractures. Be given hormone replacement therapy (HRT) to treat symptoms of menopause. Follow these instructions at home: Alcohol use Do not drink alcohol if: Your health care provider tells you not to drink. You are pregnant, may be pregnant, or are planning to become pregnant. If you drink alcohol: Limit how much you have to: 0-1 drink a day. Know how much alcohol is in your drink. In the [place] one drink equals one 12 oz bottle of beer (355 mL), one 5 oz glass of wine (148 mL), or one 1 oz glass of hard liquor (44 mL). Lifestyle Do not use any products that contain nicotine or tobacco. These products include cigarettes, chewing tobacco, and vaping devices, such as e-cigarettes. If you need help quitting, ask your health care provider. Do not use street drugs. Do not share needles. Ask your health care provider for help if you need support or information about quitting drugs. General instructions Schedule regular health, dental, and eye exams. Stay current with your vaccines. Tell your health care provider if: You often feel depressed. You have ever been abused or do not feel safe at home. Summary Adopting a healthy lifestyle and getting preventive care are important in promoting health and wellness. Follow your health care provider's instructions about healthy diet, exercising, and getting tested or screened for diseases. Follow your health care provider's instructions on monitoring your  cholesterol and blood pressure. This information is not intended to replace advice given to you by your health care provider. Make sure you discuss any questions you have with your health care provider. Document Revised: 07/02/2020 Document Reviewed: 07/02/2020 Elsevier Patient Education  [place] you for enrolling in [facility]. Please follow the instructions below to securely access your online medical record. MyChart allows you to send messages to your doctor, view your test results, manage appointments, and more.   How Do I Sign Up? In your Internet browser, go to [employer] and enter [email or web address]. Click on the Sign Up Now link in the Sign In box. You will see the New Member Sign Up page. Enter your MyChart Access Code exactly as it appears below. You will not need to use this code after you've completed the sign-up process. If you do not sign up before the expiration date, you must request a new code.  MyChart Access Code: Activation code not generated Current MyChart Status: Patient Declined  Enter your Social Security Number ([id number]) and Date of Birth (mm/dd/yyyy) as indicated and click Submit. You will be taken to the next sign-up page. Create a MyChart ID. This will be your MyChart login ID and cannot be  changed, so think of one that is secure and easy to remember. Create a Scientist, research (physical sciences). You can change your password at any time. Enter your Password Reset Question and Answer. This can be used at a later time if you forget your password.  Enter your e-mail address. You will receive e-mail notification when new information is available in [place]. Click Sign Up. You can now view your medical record.   Additional Information Remember, MyChart is NOT to be used for urgent needs. For medical emergencies, dial 911.

## 2022-03-26 ENCOUNTER — Inpatient Hospital Stay: Payer: Medicare Other

## 2022-03-26 VITALS — BP 127/73

## 2022-03-26 DIAGNOSIS — I130 Hypertensive heart and chronic kidney disease with heart failure and stage 1 through stage 4 chronic kidney disease, or unspecified chronic kidney disease: Secondary | ICD-10-CM | POA: Diagnosis not present

## 2022-03-26 DIAGNOSIS — D649 Anemia, unspecified: Secondary | ICD-10-CM

## 2022-03-26 DIAGNOSIS — M3500 Sicca syndrome, unspecified: Secondary | ICD-10-CM | POA: Diagnosis not present

## 2022-03-26 DIAGNOSIS — M069 Rheumatoid arthritis, unspecified: Secondary | ICD-10-CM | POA: Diagnosis not present

## 2022-03-26 DIAGNOSIS — D631 Anemia in chronic kidney disease: Secondary | ICD-10-CM | POA: Diagnosis not present

## 2022-03-26 DIAGNOSIS — N1832 Chronic kidney disease, stage 3b: Secondary | ICD-10-CM | POA: Diagnosis not present

## 2022-03-26 DIAGNOSIS — Z79899 Other long term (current) drug therapy: Secondary | ICD-10-CM | POA: Diagnosis not present

## 2022-03-26 DIAGNOSIS — N183 Chronic kidney disease, stage 3 unspecified: Secondary | ICD-10-CM

## 2022-03-26 LAB — HEMOGLOBIN AND HEMATOCRIT, BLOOD
HCT: 25.7 % — ABNORMAL LOW (ref 36.0–46.0)
Hemoglobin: 7.8 g/dL — ABNORMAL LOW (ref 12.0–15.0)

## 2022-03-26 MED ORDER — EPOETIN ALFA-EPBX 10000 UNIT/ML IJ SOLN
30000.0000 [IU] | Freq: Once | INTRAMUSCULAR | Status: AC
  Administered 2022-03-26: 30000 [IU] via SUBCUTANEOUS
  Filled 2022-03-26: qty 3

## 2022-03-27 ENCOUNTER — Ambulatory Visit (INDEPENDENT_AMBULATORY_CARE_PROVIDER_SITE_OTHER): Payer: Medicare Other | Admitting: Family Medicine

## 2022-03-27 ENCOUNTER — Encounter: Payer: Self-pay | Admitting: Family Medicine

## 2022-03-27 VITALS — BP 120/78 | HR 80 | Ht 71.0 in | Wt 250.0 lb

## 2022-03-27 DIAGNOSIS — J069 Acute upper respiratory infection, unspecified: Secondary | ICD-10-CM | POA: Diagnosis not present

## 2022-03-27 DIAGNOSIS — E039 Hypothyroidism, unspecified: Secondary | ICD-10-CM | POA: Diagnosis not present

## 2022-03-27 DIAGNOSIS — K219 Gastro-esophageal reflux disease without esophagitis: Secondary | ICD-10-CM

## 2022-03-27 DIAGNOSIS — E876 Hypokalemia: Secondary | ICD-10-CM | POA: Diagnosis not present

## 2022-03-27 MED ORDER — OMEPRAZOLE 40 MG PO CPDR: DELAYED_RELEASE_CAPSULE | ORAL | 1 refills | Status: DC

## 2022-03-27 MED ORDER — POTASSIUM CHLORIDE CRYS ER 20 MEQ PO TBCR: 20.0000 meq | EXTENDED_RELEASE_TABLET | Freq: Every day | ORAL | 1 refills | Status: DC

## 2022-03-27 MED ORDER — MONTELUKAST SODIUM 10 MG PO TABS: ORAL_TABLET | ORAL | 1 refills | Status: DC

## 2022-03-27 MED ORDER — LEVOTHYROXINE SODIUM 150 MCG PO TABS: 150.0000 ug | ORAL_TABLET | Freq: Every day | ORAL | 1 refills | Status: DC

## 2022-03-27 NOTE — Progress Notes (Signed)
Date:  03/27/2022   Name:  Michele Moore   DOB:  1951-01-14   MRN:  [id number]   Chief Complaint: Hypothyroidism, Gastroesophageal Reflux, Allergic Rhinitis , and hypokalemia  Gastroesophageal Reflux She reports no abdominal pain, no belching, no chest pain, no choking, no coughing, no dysphagia, no heartburn, no hoarse voice, no nausea, no sore throat or no wheezing. This is a chronic problem. The symptoms are aggravated by certain foods. Pertinent negatives include no fatigue or weight loss. There are no known risk factors. She has tried a PPI for the symptoms. The treatment provided mild relief.  Thyroid Problem Presents for follow-up visit. Patient reports no anxiety, cold intolerance, constipation, depressed mood, diaphoresis, diarrhea, dry skin, fatigue, hair loss, heat intolerance, hoarse voice, leg swelling, nail problem, palpitations, tremors, visual change, weight gain or weight loss. The symptoms have been improving.    Lab Results  Component Value Date   NA 138 01/08/2022   K 3.9 01/08/2022   CO2 25 01/08/2022   GLUCOSE 103 (H) 01/08/2022   BUN 35 (H) 01/08/2022   CREATININE 2.07 (H) 01/08/2022   CALCIUM 9.8 01/08/2022   EGFR 21 (L) 09/16/2021   GFRNONAA 25 (L) 01/08/2022   Lab Results  Component Value Date   CHOL 198 03/19/2021   HDL 49 03/19/2021   LDLCALC 121 (H) 03/19/2021   TRIG 159 (H) 03/19/2021   CHOLHDL 4.2 10/01/2018   Lab Results  Component Value Date   TSH 0.113 (L) 11/25/2021   Lab Results  Component Value Date   HGBA1C 5.3 11/25/2021   Lab Results  Component Value Date   WBC 9.7 03/10/2022   HGB 7.8 (L) 03/26/2022   HCT 25.7 (L) 03/26/2022   MCV 88.2 03/10/2022   PLT 267 03/10/2022   Lab Results  Component Value Date   ALT 10 01/08/2022   AST 21 01/08/2022   ALKPHOS 53 01/08/2022   BILITOT 0.4 01/08/2022   No results found for: "25OHVITD2", "25OHVITD3", "VD25OH"   Review of Systems  Constitutional: Negative.  Negative for chills,  diaphoresis, fatigue, fever, unexpected weight change, weight gain and weight loss.  HENT:  Negative for congestion, ear discharge, ear pain, hoarse voice, rhinorrhea, sinus pressure, sneezing and sore throat.   Respiratory:  Negative for cough, choking, shortness of breath, wheezing and stridor.   Cardiovascular:  Negative for chest pain and palpitations.  Gastrointestinal:  Negative for abdominal pain, blood in stool, constipation, diarrhea, dysphagia, heartburn and nausea.  Endocrine: Negative for cold intolerance and heat intolerance.  Genitourinary:  Negative for dysuria, flank pain, frequency, hematuria, urgency and vaginal discharge.  Musculoskeletal:  Negative for arthralgias, back pain and myalgias.  Skin:  Negative for rash.  Neurological:  Negative for dizziness, tremors, weakness and headaches.  Hematological:  Negative for adenopathy. Does not bruise/bleed easily.  Psychiatric/Behavioral:  Negative for dysphoric mood. The patient is not nervous/anxious.     Patient Active Problem List   Diagnosis Date Noted   Symptomatic anemia 12/18/2021   Anemia due to stage 3b chronic kidney disease ([place]) 12/18/2021   Rheumatoid arthritis ([place]) 12/18/2021   S/P revision of total knee 10/09/2021   Chronic instability of left knee 02/12/2021   Long term methotrexate user 01/03/2021   Stage 3b chronic kidney disease ([place]) 01/03/2021   Piriformis syndrome of left side 11/15/2020   Normocytic anemia 04/22/2020   Lumbar spondylosis 02/16/2020   Arthritis of left knee 11/24/2019   Left hip pain 08/08/2019   Primary osteoarthritis  of left hip 08/08/2019   Non-seasonal allergic rhinitis 04/12/2019   Bursitis of left shoulder 03/24/2019   Benign essential hypertension 03/06/2019   Proteinuria 03/06/2019   Trochanteric bursitis of left hip 10/04/2018   SI joint arthritis (left) 10/04/2018   Exertional chest pain 09/30/2018   CKD (chronic kidney disease), stage III ([place]) 09/30/2018    Psoriatic arthritis ([place]) 04/23/2018   Trigger ring finger of left hand 04/14/2018   Female pelvic pain 01/20/2018   History of revision of total knee arthroplasty 01/20/2018   Iron deficiency anemia    Heme + stool    Acute gastritis without hemorrhage    Gastric polyps    Benign neoplasm of ascending colon    Benign neoplasm of transverse colon    Polyp of sigmoid colon    Hypertension 10/01/2017   Hypothyroidism 10/01/2017   Gastroesophageal reflux disease 10/01/2017   Hypokalemia 10/01/2017   Mixed hyperlipidemia 10/01/2017   Reactive airway disease 10/01/2017   Bradycardia 08/24/2017   Severe obesity (BMI 35.0-35.9 with comorbidity) ([place]) 08/18/2017   Chronic gouty arthropathy without tophi 06/25/2017   Encounter for long-term (current) use of high-risk medication 06/25/2017   Positive ANA (antinuclear antibody) 06/17/2017   Cervical facet syndrome 06/16/2017   Primary osteoarthritis of right shoulder 06/16/2017   Sprain of anterior talofibular ligament of right ankle 06/16/2017   Lumbar radiculopathy 04/21/2017   Lumbar degenerative disc disease 04/21/2017   Chronic pain syndrome 04/21/2017   Spinal stenosis, lumbar region, with neurogenic claudication 04/21/2017   SS-A antibody positive 03/05/2017   Chronic knee pain after total replacement of left knee joint 08/03/2015   DOE (dyspnea on exertion) 12/28/2013    Allergies  Allergen Reactions   Ampicillin Swelling   Penicillins Anaphylaxis and Swelling    IgE = 10 (11/05/2017)  Has patient had a PCN reaction causing immediate rash, facial/tongue/throat swelling, SOB or lightheadedness with hypotension: Yes Has patient had a PCN reaction causing severe rash involving mucus membranes or skin necrosis: No Has patient had a PCN reaction that required hospitalization: No Has patient had a PCN reaction occurring within the last 10 years: Yes If all of the above answers are "NO", then may proceed with Cephalosporin use.    Vancomycin Rash    Other reaction(s): Other (see comments)   Vibramycin [Doxycycline Calcium] Rash    Past Surgical History:  Procedure Laterality Date   ANKLE SURGERY Right    CARPAL TUNNEL RELEASE     x3   COLONOSCOPY  2000?   COLONOSCOPY WITH PROPOFOL N/A 10/22/2017   Procedure: COLONOSCOPY WITH PROPOFOL;  Surgeon: Lucilla Lame, MD;  Location: [facility];  Service: Endoscopy;  Laterality: N/A;   ESOPHAGOGASTRODUODENOSCOPY (EGD) WITH PROPOFOL N/A 10/22/2017   Procedure: ESOPHAGOGASTRODUODENOSCOPY (EGD) WITH PROPOFOL;  Surgeon: Lucilla Lame, MD;  Location: [place];  Service: Endoscopy;  Laterality: N/A;   FUSION OF TALONAVICULAR JOINT Right 08/03/2015   Procedure: TAILOR NAVICULAR JOINT FUSION - RIGHT ;  Surgeon: [name], DPM;  Location: [facility];  Service: Podiatry;  Laterality: Right;   JOINT REPLACEMENT Right    knee x 3 ,right x1 and left x2   LOOP RECORDER IMPLANT  08/22/2021   Procedure: LOOP RECORDER IMPLANT; Location: [facility]; Surgeon: Virl Axe, MD   POLYPECTOMY N/A 10/22/2017   Procedure: POLYPECTOMY;  Surgeon: Lucilla Lame, MD;  Location: [facility];  Service: Endoscopy;  Laterality: N/A;   ROTATOR CUFF REPAIR Right 1995   TONSILLECTOMY     TOTAL KNEE REVISION  Right 01/20/2018   Procedure: POLYETHYLENE EXCHANGE;  Surgeon: Dereck Leep, MD;  Location: [facility];  Service: Orthopedics;  Laterality: Right;   TOTAL KNEE REVISION Left 10/09/2021   Procedure: LEFT TOTAL KNEE REVISION WITH POLYETHYLENE EXCHANGE.;  Surgeon: Dereck Leep, MD;  Location: [facility];  Service: Orthopedics;  Laterality: Left;   TUBAL LIGATION     UPPER GI ENDOSCOPY  2000?    Social History   Tobacco Use   Smoking status: Former    Packs/day: 2.00    Years: 20.00    Total pack years: 40.00    Types: Cigarettes    Quit date: 02/24/1993    Years since quitting: 29.1   Smokeless tobacco: Never  Vaping Use   Vaping Use: Never used  Substance Use Topics    Alcohol use: No    Alcohol/week: 0.0 standard drinks of alcohol   Drug use: No     Medication list has been reviewed and updated.  Current Meds  Medication Sig   acetaminophen (TYLENOL) 650 MG CR tablet Take 650 mg by mouth every 8 (eight) hours as needed for pain.   albuterol (VENTOLIN HFA) 108 (90 Base) MCG/ACT inhaler Inhale 2 puffs into the lungs every 6 (six) hours as needed for wheezing or shortness of breath.   allopurinol (ZYLOPRIM) 100 MG tablet Take 200 mg by mouth every morning.    betamethasone dipropionate 0.05 % cream APPLY TO PSORIASIS ON HANDS TWICE DAILY ONLY. AVOID FACE, GROIN AND AXILLA   betamethasone valerate (VALISONE) 0.1 % cream Apply topically 2 (two) times daily as needed (Rash).   calcipotriene (DOVONOX) 0.005 % cream APPLY TOPICALLY TO PSORIASIS AREAS ON LEGS AND FEET ONCE OR TWICE DAILY   Cholecalciferol (VITAMIN D3) 2000 units TABS Take 2,000 Units by mouth daily.   clobetasol ointment (TEMOVATE) 0.05 % Apply topically 2 (two) times daily.   diclofenac sodium (VOLTAREN) 1 % GEL Apply 2 g topically 3 (three) times daily.    FARXIGA 10 MG TABS tablet Take 10 mg by mouth every morning.   furosemide (LASIX) 40 MG tablet Take 40 mg by mouth daily.   gabapentin (NEURONTIN) 600 MG tablet Take 1 tablet (600 mg total) by mouth at bedtime.   HYDROcodone-acetaminophen (NORCO) 7.5-325 MG tablet Take 1 tablet by mouth every 8 (eight) hours as needed for severe pain. Must last 30 days.   ketoconazole (NIZORAL) 2 % cream Apply topically daily as needed.   levothyroxine (SYNTHROID) 150 MCG tablet Take 1 tablet (150 mcg total) by mouth daily.   losartan (COZAAR) 25 MG tablet Take 25 mg by mouth daily.   meclizine (ANTIVERT) 12.5 MG tablet TAKE 1 TABLET(12.5 MG) BY MOUTH THREE TIMES DAILY AS NEEDED FOR DIZZINESS   metoprolol tartrate (LOPRESSOR) 25 MG tablet Take 1 tablet (25 mg total) by mouth 2 (two) times daily.   montelukast (SINGULAIR) 10 MG tablet TAKE 1 TABLET(10 MG) BY  MOUTH AT BEDTIME   Multiple Vitamins-Calcium (ONE-A-DAY WOMENS FORMULA PO) Take 1 tablet by mouth daily.   mupirocin ointment (BACTROBAN) 2 % Apply 1 application topically 2 (two) times daily.   nystatin cream (MYCOSTATIN) Apply 1 Application topically 2 (two) times daily.   omega-3 acid ethyl esters (LOVAZA) 1 g capsule Take 1 capsule (1 g total) by mouth 2 (two) times daily.   omeprazole (PRILOSEC) 40 MG capsule TAKE 1 CAPSULE(40 MG) BY MOUTH DAILY   OTEZLA 30 MG TABS Take 1 tablet by mouth daily.   Polyethyl Glycol-Propyl Glycol (SYSTANE  OP) Place 1 drop into both eyes daily as needed (dry eyes).   potassium chloride SA (KLOR-CON M) 20 MEQ tablet Take 1 tablet (20 mEq total) by mouth daily.   tiZANidine (ZANAFLEX) 4 MG tablet Take 1 tablet (4 mg total) by mouth at bedtime.   traMADol (ULTRAM) 50 MG tablet Take 1-2 tablets (50-100 mg total) by mouth every 4 (four) hours as needed for moderate pain.   triamcinolone (KENALOG) 0.1 % Apply 1 application topically 2 (two) times daily.       03/27/2022    3:33 PM 11/25/2021    2:52 PM 09/16/2021    1:43 PM 06/12/2020   10:20 AM  GAD 7 : Generalized Anxiety Score  Nervous, Anxious, on Edge 0 0 0 0  Control/stop worrying 0 0 0 0  Worry too much - different things 0 0 0 0  Trouble relaxing 0 0 3 0  Restless 0 0 0 0  Easily annoyed or irritable 0 0 0 0  Afraid - awful might happen 0 0 0 0  Total GAD 7 Score 0 0 3 0  Anxiety Difficulty Not difficult at all  Not difficult at all        03/27/2022    3:33 PM 03/24/2022    2:45 PM 11/25/2021    2:52 PM  Depression screen PHQ 2/9  Decreased Interest 0 0 0  Down, Depressed, Hopeless 0 0 0  PHQ - 2 Score 0 0 0  Altered sleeping 0  0  Tired, decreased energy 0  0  Change in appetite 0  0  Feeling bad or failure about yourself  0  0  Trouble concentrating 0  0  Moving slowly or fidgety/restless 0  0  Suicidal thoughts 0  0  PHQ-9 Score 0  0  Difficult doing work/chores Not difficult at all   Not difficult at all    BP Readings from Last 3 Encounters:  03/27/22 120/78  03/26/22 127/73  03/12/22 130/76    Physical Exam Vitals and nursing note reviewed. Exam conducted with a chaperone present.  Constitutional:      General: She is not in acute distress.    Appearance: She is not diaphoretic.  HENT:     Head: Normocephalic and atraumatic.     Right Ear: Tympanic membrane and external ear normal.     Left Ear: Tympanic membrane and external ear normal.     Nose: Nose normal.  Eyes:     General:        Right eye: No discharge.        Left eye: No discharge.     Conjunctiva/sclera: Conjunctivae normal.     Pupils: Pupils are equal, round, and reactive to light.  Neck:     Thyroid: No thyromegaly.     Vascular: No JVD.  Cardiovascular:     Rate and Rhythm: Normal rate and regular rhythm.     Heart sounds: Normal heart sounds. No murmur heard.    No friction rub. No gallop.  Pulmonary:     Effort: Pulmonary effort is normal.     Breath sounds: Normal breath sounds. No wheezing, rhonchi or rales.  Abdominal:     General: Bowel sounds are normal.     Palpations: Abdomen is soft. There is no mass.     Tenderness: There is no abdominal tenderness. There is no guarding.  Musculoskeletal:        General: Normal range of motion.  Cervical back: Normal range of motion and neck supple.  Lymphadenopathy:     Cervical: No cervical adenopathy.  Skin:    General: Skin is warm and dry.  Neurological:     Mental Status: She is alert.     Deep Tendon Reflexes: Reflexes are normal and symmetric.     Wt Readings from Last 3 Encounters:  03/27/22 250 lb (113.4 kg)  03/12/22 244 lb 11.2 oz (111 kg)  02/10/22 240 lb (108.9 kg)    BP 120/78   Pulse 80   Ht '5\' 11"'$  (1.803 m)   Wt 250 lb (113.4 kg)   BMI 34.87 kg/m   Assessment and Plan:  1. Hypothyroidism, unspecified type Chronic.  Controlled.  Stable.  Will check thyroid panel with TSH and likely continue  levothyroxine 150 mcg daily. - levothyroxine (SYNTHROID) 150 MCG tablet; Take 1 tablet (150 mcg total) by mouth daily.  Dispense: 90 tablet; Refill: 1 - Thyroid Panel With TSH  2. Viral upper respiratory tract infection Chronic respiratory reactivity for which we can continue Singulair 10 mg once nightly. - montelukast (SINGULAIR) 10 MG tablet; TAKE 1 TABLET(10 MG) BY MOUTH AT BEDTIME  Dispense: 90 tablet; Refill: 1  3. Gastroesophageal reflux disease Chronic.  Recurrent.  Patient is having some reflux issues particular with spicy foods.  Patient will continue omeprazole 40 mg once a day. - omeprazole (PRILOSEC) 40 MG capsule; TAKE 1 CAPSULE(40 MG) BY MOUTH DAILY  Dispense: 90 capsule; Refill: 1  4. Hypokalemia Chronic.  Controlled.  Stable.  Patient's had issues with hypokalemia in the past and we will continue potassium chloride 20 mEq sustained-release once a day. - potassium chloride SA (KLOR-CON M) 20 MEQ tablet; Take 1 tablet (20 mEq total) by mouth daily.  Dispense: 90 tablet; Refill: 1    Otilio Miu, MD

## 2022-03-28 ENCOUNTER — Other Ambulatory Visit: Payer: Self-pay | Admitting: Oncology

## 2022-03-28 ENCOUNTER — Inpatient Hospital Stay: Payer: Medicare Other | Attending: Oncology

## 2022-03-28 VITALS — BP 112/48 | HR 68 | Temp 98.4°F | Resp 18

## 2022-03-28 DIAGNOSIS — I130 Hypertensive heart and chronic kidney disease with heart failure and stage 1 through stage 4 chronic kidney disease, or unspecified chronic kidney disease: Secondary | ICD-10-CM | POA: Insufficient documentation

## 2022-03-28 DIAGNOSIS — Z79899 Other long term (current) drug therapy: Secondary | ICD-10-CM | POA: Diagnosis not present

## 2022-03-28 DIAGNOSIS — D649 Anemia, unspecified: Secondary | ICD-10-CM

## 2022-03-28 DIAGNOSIS — N183 Chronic kidney disease, stage 3 unspecified: Secondary | ICD-10-CM

## 2022-03-28 DIAGNOSIS — D631 Anemia in chronic kidney disease: Secondary | ICD-10-CM | POA: Diagnosis not present

## 2022-03-28 LAB — THYROID PANEL WITH TSH
Free Thyroxine Index: 2.8 (ref 1.2–4.9)
T3 Uptake Ratio: 28 % (ref 24–39)
T4, Total: 10 ug/dL (ref 4.5–12.0)
TSH: 0.499 u[IU]/mL (ref 0.450–4.500)

## 2022-03-28 MED ORDER — SODIUM CHLORIDE 0.9 % IV SOLN
200.0000 mg | Freq: Once | INTRAVENOUS | Status: AC
  Administered 2022-03-28: 200 mg via INTRAVENOUS
  Filled 2022-03-28: qty 10

## 2022-03-28 MED ORDER — SODIUM CHLORIDE 0.9 % IV SOLN
INTRAVENOUS | Status: DC
  Filled 2022-03-28: qty 250

## 2022-03-28 NOTE — Patient Instructions (Signed)

## 2022-04-03 ENCOUNTER — Telehealth: Payer: Self-pay

## 2022-04-03 NOTE — Telephone Encounter (Signed)
-----   Message from Lurlean Nanny, [place] sent at 01/27/2022  2:46 PM EST ----- Regarding: colon egd Dysphagia Hx of colon polyps

## 2022-04-04 ENCOUNTER — Inpatient Hospital Stay: Payer: Medicare Other

## 2022-04-04 VITALS — BP 110/56 | HR 57 | Temp 97.1°F | Resp 18

## 2022-04-04 DIAGNOSIS — N183 Chronic kidney disease, stage 3 unspecified: Secondary | ICD-10-CM | POA: Diagnosis not present

## 2022-04-04 DIAGNOSIS — I130 Hypertensive heart and chronic kidney disease with heart failure and stage 1 through stage 4 chronic kidney disease, or unspecified chronic kidney disease: Secondary | ICD-10-CM | POA: Diagnosis not present

## 2022-04-04 DIAGNOSIS — Z79899 Other long term (current) drug therapy: Secondary | ICD-10-CM | POA: Diagnosis not present

## 2022-04-04 DIAGNOSIS — D649 Anemia, unspecified: Secondary | ICD-10-CM

## 2022-04-04 DIAGNOSIS — D631 Anemia in chronic kidney disease: Secondary | ICD-10-CM | POA: Diagnosis not present

## 2022-04-04 MED ORDER — SODIUM CHLORIDE 0.9 % IV SOLN
200.0000 mg | Freq: Once | INTRAVENOUS | Status: AC
  Administered 2022-04-04: 200 mg via INTRAVENOUS
  Filled 2022-04-04: qty 200

## 2022-04-04 MED ORDER — SODIUM CHLORIDE 0.9 % IV SOLN
INTRAVENOUS | Status: DC
  Filled 2022-04-04 (×2): qty 250

## 2022-04-04 NOTE — Patient Instructions (Signed)

## 2022-04-08 MED FILL — Iron Sucrose Inj 20 MG/ML (Fe Equiv): INTRAVENOUS | Qty: 10 | Status: AC

## 2022-04-09 ENCOUNTER — Inpatient Hospital Stay: Payer: Medicare Other

## 2022-04-09 VITALS — BP 130/77

## 2022-04-09 DIAGNOSIS — N183 Chronic kidney disease, stage 3 unspecified: Secondary | ICD-10-CM | POA: Diagnosis not present

## 2022-04-09 DIAGNOSIS — D649 Anemia, unspecified: Secondary | ICD-10-CM

## 2022-04-09 DIAGNOSIS — Z79899 Other long term (current) drug therapy: Secondary | ICD-10-CM | POA: Diagnosis not present

## 2022-04-09 DIAGNOSIS — I130 Hypertensive heart and chronic kidney disease with heart failure and stage 1 through stage 4 chronic kidney disease, or unspecified chronic kidney disease: Secondary | ICD-10-CM | POA: Diagnosis not present

## 2022-04-09 DIAGNOSIS — D631 Anemia in chronic kidney disease: Secondary | ICD-10-CM | POA: Diagnosis not present

## 2022-04-09 LAB — HEMOGLOBIN AND HEMATOCRIT, BLOOD
HCT: 28 % — ABNORMAL LOW (ref 36.0–46.0)
Hemoglobin: 8.4 g/dL — ABNORMAL LOW (ref 12.0–15.0)

## 2022-04-09 MED ORDER — EPOETIN ALFA-EPBX 10000 UNIT/ML IJ SOLN
30000.0000 [IU] | Freq: Once | INTRAMUSCULAR | Status: AC
  Administered 2022-04-09: 30000 [IU] via SUBCUTANEOUS
  Filled 2022-04-09: qty 3

## 2022-04-10 DIAGNOSIS — Z96652 Presence of left artificial knee joint: Secondary | ICD-10-CM | POA: Diagnosis not present

## 2022-04-15 ENCOUNTER — Ambulatory Visit
Payer: Medicare Other | Attending: Student in an Organized Health Care Education/Training Program | Admitting: Student in an Organized Health Care Education/Training Program

## 2022-04-15 ENCOUNTER — Encounter: Payer: Self-pay | Admitting: Student in an Organized Health Care Education/Training Program

## 2022-04-15 VITALS — BP 108/59 | HR 65 | Temp 97.9°F | Resp 16 | Ht 71.0 in | Wt 246.0 lb

## 2022-04-15 DIAGNOSIS — Z96652 Presence of left artificial knee joint: Secondary | ICD-10-CM

## 2022-04-15 DIAGNOSIS — G8929 Other chronic pain: Secondary | ICD-10-CM | POA: Diagnosis not present

## 2022-04-15 DIAGNOSIS — M25562 Pain in left knee: Secondary | ICD-10-CM | POA: Diagnosis not present

## 2022-04-15 DIAGNOSIS — M5416 Radiculopathy, lumbar region: Secondary | ICD-10-CM | POA: Diagnosis not present

## 2022-04-15 DIAGNOSIS — G894 Chronic pain syndrome: Secondary | ICD-10-CM | POA: Insufficient documentation

## 2022-04-15 MED ORDER — HYDROCODONE-ACETAMINOPHEN 7.5-325 MG PO TABS: 1.0000 | ORAL_TABLET | Freq: Three times a day (TID) | ORAL | 0 refills | Status: DC | PRN

## 2022-04-15 MED ORDER — HYDROCODONE-ACETAMINOPHEN 7.5-325 MG PO TABS: 1.0000 | ORAL_TABLET | Freq: Three times a day (TID) | ORAL | 0 refills | Status: AC | PRN

## 2022-04-15 NOTE — Progress Notes (Signed)
PROVIDER NOTE: Information contained herein reflects review and annotations entered in association with encounter. Interpretation of such information and data should be left to medically-trained personnel. Information provided to patient can be located elsewhere in the medical record under "Patient Instructions". Document created using STT-dictation technology, any transcriptional errors that may result from process are unintentional.    Patient: Michele Moore  Service Category: E/M  Provider: Gillis Santa, MD  DOB: October 09, 1950  DOS: 04/15/2022  Specialty: Interventional Pain Management  MRN: [id number]  Setting: Ambulatory outpatient  PCP: Myles Gip Yetta Barre, MD  Type: Established Patient    Referring Provider: Juline Patch, MD  Location: Office  Delivery: Face-to-face     HPI  [name], a 72 y.o. year old female, is here today because of her Chronic knee pain after total replacement of left knee joint [M25.562, G89.29, Z96.652]. [name] primary complain today is Hip Pain (Left ), Back Pain (Lumbar mainly on the left ), and Knee Pain (Left s/p joint replacement October 09, 2021) Last encounter: My last encounter with her was on 02/10/22  Pertinent problems: [name] has Chronic knee pain after total replacement of left knee joint; Lumbar radiculopathy; Lumbar degenerative disc disease; Chronic pain syndrome; Spinal stenosis, lumbar region, with neurogenic claudication; Cervical facet syndrome; Primary osteoarthritis of right shoulder; Sprain of anterior talofibular ligament of right ankle; Severe obesity (BMI 35.0-35.9 with comorbidity) ([place]); CKD (chronic kidney disease), stage III ([place]); Primary osteoarthritis of left hip; and Lumbar spondylosis on their pertinent problem list. Pain Assessment: Severity of Chronic pain is reported as a 8 /10. Location: Back (see visit info for additional pain sites.) Lower, Left, Right/back pain into left leg to the knee. Onset: More than a month ago.  Quality: Discomfort, Constant, Shooting, Nagging. Timing: Constant. Modifying factor(s): elevating the leg, ice,  medications. Vitals:  height is 5' 11"$  (1.803 m) and weight is 246 lb (111.6 kg). Her temporal temperature is 97.9 F (36.6 C). Her blood pressure is 108/59 (abnormal) and her pulse is 65. Her respiration is 16 and oxygen saturation is 97%.   Reason for encounter: MM and PPE  Worsening left knee pain, knee replacement 10/09/21, s/p genicular nerve RFA April 2023. Genicular nerve RFA did provide some pain relief however her orthopedist has recommended we consider repeating genicualr RFA Improvement in right shoulder pain after SSNB and radiating left leg pain after ESI Refill of chronic pain medications I have also discussed bracing options for her left knee as well as TENS therapy.  I will have her discuss this with [employer] representative CJ   Post-procedure evaluation   Type: Suprascapular nerve block (SSNB) #1  Laterality:  Right Level: Superior to scapular spine, lateral to supraspinatus fossa (Suprascapular notch).  Imaging: Fluoroscopic guidance         Anesthesia: Local anesthesia (1-2% Lidocaine) DOS: 02/10/2022  Performed by: Gillis Santa, MD  Left L4-L5 ESI 02/10/22    NAS-11 score:   Pre-procedure: 8 /10   Post-procedure: 0-No pain/10      Effectiveness:  Initial hour after procedure: 100 %. Subsequent 4-6 hours post-procedure: 100 %  Analgesia past initial 6 hours: 100 % (approx 2 weeks later the pain started shooting again in the lower back.)  Ongoing improvement:  Analgesic:  50% Function: Somewhat improved ROM: Somewhat improved    Pharmacotherapy Assessment  Analgesic: Norco 7.5 mg TID PRN #90/month, 22.5    Monitoring: [place] PMP: PDMP reviewed during this encounter.  Pharmacotherapy: No side-effects or adverse reactions reported. Compliance: No problems identified. Effectiveness: Clinically acceptable.  Janett Billow, RN  04/15/2022   2:12 PM  Sign when Signing Visit Nursing Pain Medication Assessment:  Safety precautions to be maintained throughout the outpatient stay will include: orient to surroundings, keep bed in low position, maintain call bell within reach at all times, provide assistance with transfer out of bed and ambulation.  Medication Inspection Compliance: Pill count conducted under aseptic conditions, in front of the patient. Neither the pills nor the bottle was removed from the patient's sight at any time. Once count was completed pills were immediately returned to the patient in their original bottle.  Medication: Hydrocodone/APAP Pill/Patch Count:  30 of 90 pills remain Pill/Patch Appearance: Markings consistent with prescribed medication Bottle Appearance: Standard pharmacy container. Clearly labeled. Filled Date: 01 / 29 / 2024 Last Medication intake:  Today    UDS:  Summary  Date Value Ref Range Status  08/06/2021 Note  Final    Comment:    ==================================================================== [employer] [facility] (MW) ==================================================================== Test                             Result       Flag       Units  Drug Present and Declared for Prescription Verification   Hydrocodone                    1383         EXPECTED   ng/mg creat   Hydromorphone                  260          EXPECTED   ng/mg creat   Norhydrocodone                 557          EXPECTED   ng/mg creat    Sources of hydrocodone include scheduled prescription medications.    Hydromorphone and norhydrocodone are expected metabolites of    hydrocodone. Hydromorphone is also available as a scheduled    prescription medication.  ==================================================================== Test                      Result    Flag   Units      Ref Range   Creatinine              47               mg/dL       >=20 ==================================================================== Declared Medications:  The flagging and interpretation on this report are based on the  following declared medications.  Unexpected results may arise from  inaccuracies in the declared medications.   **Note: The testing scope of this panel includes these medications:   Hydrocodone (Norco)   **Note: The testing scope of this panel does not include the  following reported medications:   Acetaminophen (Norco)  Albuterol (Ventolin HFA)  Allopurinol (Zyloprim)  Apremilast  Betamethasone  Calcipotriene  Clobetasol (Temovate)  Dapagliflozin (Farxiga)  Fish Oil  Fluticasone (Flonase)  Furosemide (Lasix)  Gabapentin (Neurontin)  Hydralazine (Apresoline)  Hydrochlorothiazide (Maxzide)  Iron  Ketoconazole (Nizoral)  Levothyroxine (Synthroid)  Losartan (Cozaar)  Meclizine (Antivert)  Methotrexate  Mometasone (Nasonex)  Montelukast (Singulair)  Multivitamin  Mupirocin (Bactroban)  Omeprazole (Prilosec)  Polyethylene Glycol  Potassium (Klor-Con)  Tizanidine (Zanaflex)  Topical  Topical Diclofenac (Voltaren)  Triamcinolone (Kenalog)  Triamterene (Maxzide)  Vitamin D3 ==================================================================== For clinical consultation, please call [phone]. ====================================================================      ROS  Constitutional: Denies any fever or chills Gastrointestinal: No reported hemesis, hematochezia, vomiting, or acute GI distress Musculoskeletal: Low back pain, left knee pain Neurological: No reported episodes of acute onset apraxia, aphasia, dysarthria, agnosia, amnesia, paralysis, loss of coordination, or loss of consciousness  Medication Review  Apremilast, Cholecalciferol, HYDROcodone-acetaminophen, Multiple Vitamins-Calcium, Polyethyl Glycol-Propyl Glycol, acetaminophen, albuterol, allopurinol, betamethasone dipropionate,  calcipotriene, clobetasol ointment, dapagliflozin propanediol, diclofenac sodium, fluticasone, furosemide, gabapentin, ketoconazole, levothyroxine, losartan, meclizine, metoprolol tartrate, mometasone, montelukast, mupirocin ointment, nystatin cream, omega-3 acid ethyl esters, omeprazole, potassium chloride SA, tiZANidine, and triamcinolone cream  History Review  Allergy: [name] is allergic to ampicillin, penicillins, vancomycin, and vibramycin [doxycycline calcium]. Drug: [name]  reports no history of drug use. Alcohol:  reports no history of alcohol use. Tobacco:  reports that she quit smoking about 29 years ago. Her smoking use included cigarettes. She has a 40.00 pack-year smoking history. She has never used smokeless tobacco. Social: [name]  reports that she quit smoking about 29 years ago. Her smoking use included cigarettes. She has a 40.00 pack-year smoking history. She has never used smokeless tobacco. She reports that she does not drink alcohol and does not use drugs. Medical:  has a past medical history of (HFpEF) heart failure with preserved ejection fraction ([place]) (01/13/2014), Allergy, ANA positive, Anemia of chronic renal failure, stage 3 (moderate), unspecified whether stage 3a or 3b CKD ([place]), Aortic atherosclerosis ([place]), Asthma, Cataract, Chronic pain syndrome, Chronic, continuous use of opioids, CKD (chronic kidney disease), stage III ([place]), Complication of anesthesia, DDD (degenerative disc disease), lumbar, Dyspnea on exertion, Dysrhythmia, Family history of adverse reaction to anesthesia, GERD (gastroesophageal reflux disease), Glaucoma, Gout, H/O tooth extraction, Hemorrhoid, History of hiatal hernia, History of kidney stones, Hypertension, Hypothyroidism, Implantable loop recorder present (08/22/2021), Long term current use of immunosuppressive drug, Migraines, Mixed hyperlipidemia, Multiple gastric ulcers, Osteoarthritis, Psoriasis, Psoriatic arthritis ([place]), Rheumatoid  arthritis ([place]), SS-A antibody positive, and Wears dentures. Surgical: [name]  has a past surgical history that includes Carpal tunnel release; Rotator cuff repair (Right, 1995); Ankle surgery (Right); Tubal ligation; Colonoscopy (2000?); Upper gi endoscopy (2000?); Tonsillectomy; Fusion of talonavicular joint (Right, 08/03/2015); Colonoscopy with propofol (N/A, 10/22/2017); Esophagogastroduodenoscopy (egd) with propofol (N/A, 10/22/2017); polypectomy (N/A, 10/22/2017); Total knee revision (Right, 01/20/2018); Joint replacement (Right); Loop recorder implant (08/22/2021); and Total knee revision (Left, 10/09/2021). Family: family history includes Arthritis in her mother; COPD in her sister; Cancer in her maternal aunt and maternal uncle; Diabetes in her father; Gout in her mother; Heart failure in her father and mother; Hyperlipidemia in her father; Hypertension in her mother; Stroke in her mother.  Laboratory Chemistry Profile   Renal Lab Results  Component Value Date   BUN 35 (H) 01/08/2022   CREATININE 2.07 (H) 01/08/2022   BCR 15 09/16/2021   GFRAA 30 (L) 08/28/2019   GFRNONAA 25 (L) 01/08/2022    Hepatic Lab Results  Component Value Date   AST 21 01/08/2022   ALT 10 01/08/2022   ALBUMIN 3.5 01/08/2022   ALKPHOS 53 01/08/2022    Electrolytes Lab Results  Component Value Date   NA 138 01/08/2022   K 3.9 01/08/2022   CL 102 01/08/2022   CALCIUM 9.8 01/08/2022   MG 2.0 10/01/2018   PHOS 3.0 09/16/2021    Bone No results found for: "VD25OH", "VD125OH2TOT", "[id number]", "[id number]", "25OHVITD1", "  25OHVITD2", "[id number]", "TESTOFREE", "TESTOSTERONE"  Inflammation (CRP: Acute Phase) (ESR: Chronic Phase) Lab Results  Component Value Date   CRP 1.6 (H) 09/27/2021   ESRSEDRATE 79 (H) 09/27/2021         Note: Above Lab results reviewed.  Recent Imaging Review  CUP PACEART REMOTE DEVICE CHECK ILR  summary report received. Battery status OK. Normal device function. No new  symptom, tachy, brady, or pause episodes. No new AF episodes,  burden 0%, no OAC, dx with SCAF.  Monthly summary reports and ROV/PRN LA Note: Reviewed        Physical Exam  General appearance: Well nourished, well developed, and well hydrated. In no apparent acute distress Mental status: Alert, oriented x 3 (person, place, & time)       Respiratory: No evidence of acute respiratory distress Eyes: PERLA Vitals: BP (!) 108/59 (BP Location: Left Arm, Patient Position: Sitting, Cuff Size: Large)   Pulse 65   Temp 97.9 F (36.6 C) (Temporal)   Resp 16   Ht 5' 11"$  (1.803 m)   Wt 246 lb (111.6 kg)   SpO2 97%   BMI 34.31 kg/m  BMI: Estimated body mass index is 34.31 kg/m as calculated from the following:   Height as of this encounter: 5' 11"$  (1.803 m).   Weight as of this encounter: 246 lb (111.6 kg). Ideal: Ideal body weight: 70.8 kg (156 lb 1.4 oz) Adjusted ideal body weight: 87.1 kg (192 lb 0.8 oz)  Cervical Spine Area Exam  Skin & Axial Inspection: No masses, redness, edema, swelling, or associated skin lesions Alignment: Symmetrical Functional ROM: Diminished ROM      Stability: No instability detected Muscle Tone/Strength: Functionally intact. No obvious neuro-muscular anomalies detected. Sensory (Neurological): Articular pain pattern and musculoskeletal Palpation: Complains of area being tender to palpation                          Upper Extremity (UE) Exam      Side: Right upper extremity   Side: Left upper extremity    Skin & Extremity Inspection: Right shoulder abnormality   Skin & Extremity Inspection: Skin color, temperature, and hair growth are WNL. No peripheral edema or cyanosis. No masses, redness, swelling, asymmetry, or associated skin lesions. No contractures.    Functional ROM: Decreased ROM for shoulder and elbow   Functional ROM: Unrestricted ROM            Muscle Tone/Strength: Functionally intact. No obvious neuro-muscular anomalies detected.   Muscle  Tone/Strength: Functionally intact. No obvious neuro-muscular anomalies detected.    Sensory (Neurological): Musculoskeletal pain pattern           Sensory (Neurological): Unimpaired            Palpation: No palpable anomalies               Palpation: No palpable anomalies                Provocative Test(s):  Phalen's test: deferred Tinel's test: deferred Apley's scratch test (touch opposite shoulder):  Action 1 (Across chest): Decreased ROM Action 2 (Overhead): Decreased ROM Action 3 (LB reach): Decreased ROM     Provocative Test(s):  Phalen's test: deferred Tinel's test: deferred Apley's scratch test (touch opposite shoulder):  Action 1 (Across chest): Decreased ROM Action 2 (Overhead): Decreased ROM Action 3 (LB reach): Decreased ROM        Thoracic Spine Area Exam  Skin & Axial Inspection: No masses,  redness, or swelling Alignment: Symmetrical Functional ROM: Unrestricted ROM Stability: No instability detected Muscle Tone/Strength: Functionally intact. No obvious neuro-muscular anomalies detected. Sensory (Neurological): Unimpaired Muscle strength & Tone: No palpable anomalies   Lumbar Spine Area Exam  Skin & Axial Inspection: No masses, redness, or swelling Alignment: Symmetrical Functional ROM: Improved after treatment       Stability: No instability detected Muscle Tone/Strength: Functionally intact. No obvious neuro-muscular anomalies detected. Sensory (Neurological): Musculoskeletal low back pain   Gait & Posture Assessment  Ambulation: Limited Gait: Antalgic Posture: Difficulty standing up straight, due to pain    Lower Extremity Exam      Side: Right lower extremity   Side: Left lower extremity  Stability: No instability observed           Stability: No instability observed          Skin & Extremity Inspection: Skin color, temperature, and hair growth are WNL. No peripheral edema or cyanosis. No masses, redness, swelling, asymmetry, or associated skin lesions.  No contractures.   Skin & Extremity Inspection: Surgical scar from prior surgery  Functional ROM: Unrestricted ROM                   Functional ROM: Decreased ROM for hip and knee joints          Muscle Tone/Strength: Functionally intact. No obvious neuro-muscular anomalies detected.   Muscle Tone/Strength: Functionally intact. No obvious neuro-muscular anomalies detected.  Sensory (Neurological): Unimpaired   Sensory (Neurological): Dermatomal pain pattern and arthropathic of left knee  Palpation: No palpable anomalies   Palpation: No palpable anomalies    Assessment   Diagnosis Status  1. 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 Lumbar radiculopathy   3. Chronic pain syndrome      Persistent Persistent Persistent   Plan of Care   [name] has a current medication list which includes the following long-term medication(s): albuterol, allopurinol, gabapentin, levothyroxine, metoprolol tartrate, montelukast, omega-3 acid ethyl esters, omeprazole, potassium chloride sa, [DISCONTINUED] fluticasone, and [DISCONTINUED] mometasone.  Pharmacotherapy (Medications Ordered): Meds ordered this encounter  Medications   HYDROcodone-acetaminophen (NORCO) 7.5-325 MG tablet    Sig: Take 1 tablet by mouth every 8 (eight) hours as needed for severe pain. Must last 30 days.    Dispense:  90 tablet    Refill:  0    Chronic Pain: STOP Act (Not applicable) Fill 1 day early if closed on refill date. Avoid benzodiazepines within 8 hours of opioids   HYDROcodone-acetaminophen (NORCO) 7.5-325 MG tablet    Sig: Take 1 tablet by mouth every 8 (eight) hours as needed for severe pain. Must last 30 days.    Dispense:  90 tablet    Refill:  0    Chronic Pain: STOP Act (Not applicable) Fill 1 day early if closed on refill date. Avoid benzodiazepines within 8 hours of opioids   HYDROcodone-acetaminophen (NORCO) 7.5-325 MG tablet    Sig: Take 1 tablet by mouth every 8 (eight)  hours as needed for severe pain. Must last 30 days.    Dispense:  90 tablet    Refill:  0    Chronic Pain: STOP Act (Not applicable) Fill 1 day early if closed on refill date. Avoid benzodiazepines within 8 hours of opioids   Orders:  Orders Placed This Encounter  Procedures   Radiofrequency,Genicular    Standing Status:   Future    Standing Expiration Date:   07/14/2022  Scheduling Instructions:     Side(s): LEFT     Level(s): Superior-Lateral, Superior-Medial, and Inferior-Medial Genicular Nerve(s)     Sedation: Patient's choice.     Scheduling Timeframe: As soon as pre-approved    Order Specific Question:   Where will this procedure be performed?    Answer:   [facility]    Recommend bracing option for left knee stabilization and TENS unit  Follow-up plan:   Return in about 2 weeks (around 04/29/2022) for Left GN RFA, in clinic [facility].    Recent Visits Date Type Provider Dept  02/10/22 Procedure visit Gillis Santa, MD [facility]  Showing recent visits within past 90 days and meeting all other requirements Today's Visits Date Type Provider Dept  04/15/22 Office Visit Gillis Santa, MD [facility]  Showing today's visits and meeting all other requirements Future Appointments No visits were found meeting these conditions. Showing future appointments within next 90 days and meeting all other requirements  I discussed the assessment and treatment plan with the patient. The patient was provided an opportunity to ask questions and all were answered. The patient agreed with the plan and demonstrated an understanding of the instructions.  Patient advised to call back or seek an in-person evaluation if the symptoms or condition worsens.  Duration of encounter: 75mnutes.  Note by: BGillis Santa MD Date: 04/15/2022; Time: 3:21 PM

## 2022-04-15 NOTE — Progress Notes (Signed)
Nursing Pain Medication Assessment:  Safety precautions to be maintained throughout the outpatient stay will include: orient to surroundings, keep bed in low position, maintain call bell within reach at all times, provide assistance with transfer out of bed and ambulation.  Medication Inspection Compliance: Pill count conducted under aseptic conditions, in front of the patient. Neither the pills nor the bottle was removed from the patient's sight at any time. Once count was completed pills were immediately returned to the patient in their original bottle.  Medication: Hydrocodone/APAP Pill/Patch Count:  30 of 90 pills remain Pill/Patch Appearance: Markings consistent with prescribed medication Bottle Appearance: Standard pharmacy container. Clearly labeled. Filled Date: 01 / 29 / 2024 Last Medication intake:  Today

## 2022-04-15 NOTE — Patient Instructions (Addendum)
____________________________________________________________________________________________  General Risks and Possible Complications  Patient Responsibilities: It is important that you read this as it is part of your informed consent. It is our duty to inform you of the risks and possible complications associated with treatments offered to you. It is your responsibility as a patient to read this and to ask questions about anything that is not clear or that you believe was not covered in this document.  Patient's Rights: You have the right to refuse treatment. You also have the right to change your mind, even after initially having agreed to have the treatment done. However, under this last option, if you wait until the last second to change your mind, you may be charged for the materials used up to that point.  Introduction: Medicine is not an Chief Strategy Officer. Everything in Medicine, including the lack of treatment(s), carries the potential for danger, harm, or loss (which is by definition: Risk). In Medicine, a complication is a secondary problem, condition, or disease that can aggravate an already existing one. All treatments carry the risk of possible complications. The fact that a side effects or complications occurs, does not imply that the treatment was conducted incorrectly. It must be clearly understood that these can happen even when everything is done following the highest safety standards.  No treatment: You can choose not to proceed with the proposed treatment alternative. The "PRO(s)" would include: avoiding the risk of complications associated with the therapy. The "CON(s)" would include: not getting any of the treatment benefits. These benefits fall under one of three categories: diagnostic; therapeutic; and/or palliative. Diagnostic benefits include: getting information which can ultimately lead to improvement of the disease or symptom(s). Therapeutic benefits are those associated with  the successful treatment of the disease. Finally, palliative benefits are those related to the decrease of the primary symptoms, without necessarily curing the condition (example: decreasing the pain from a flare-up of a chronic condition, such as incurable terminal cancer).  General Risks and Complications: These are associated to most interventional treatments. They can occur alone, or in combination. They fall under one of the following six (6) categories: no benefit or worsening of symptoms; bleeding; infection; nerve damage; allergic reactions; and/or death. No benefits or worsening of symptoms: In Medicine there are no guarantees, only probabilities. No healthcare provider can ever guarantee that a medical treatment will work, they can only state the probability that it may. Furthermore, there is always the possibility that the condition may worsen, either directly, or indirectly, as a consequence of the treatment. Bleeding: This is more common if the patient is taking a blood thinner, either prescription or over the counter (example: Goody Powders, Fish oil, Aspirin, Garlic, etc.), or if suffering a condition associated with impaired coagulation (example: Hemophilia, cirrhosis of the liver, low platelet counts, etc.). However, even if you do not have one on these, it can still happen. If you have any of these conditions, or take one of these drugs, make sure to notify your treating physician. Infection: This is more common in patients with a compromised immune system, either due to disease (example: diabetes, cancer, human immunodeficiency virus [HIV], etc.), or due to medications or treatments (example: therapies used to treat cancer and rheumatological diseases). However, even if you do not have one on these, it can still happen. If you have any of these conditions, or take one of these drugs, make sure to notify your treating physician. Nerve Damage: This is more common when the treatment is an  invasive one, but it can also happen with the use of medications, such as those used in the treatment of cancer. The damage can occur to small secondary nerves, or to large primary ones, such as those in the spinal cord and brain. This damage may be temporary or permanent and it may lead to impairments that can range from temporary numbness to permanent paralysis and/or brain death. Allergic Reactions: Any time a substance or material comes in contact with our body, there is the possibility of an allergic reaction. These can range from a mild skin rash (contact dermatitis) to a severe systemic reaction (anaphylactic reaction), which can result in death. Death: In general, any medical intervention can result in death, most of the time due to an unforeseen complication. ____________________________________________________________________________________________    ______________________________________________________________________  Preparing for your procedure  During your procedure appointment there will be: No Prescription Refills. No disability issues to discussed. No medication changes or discussions.  Instructions: Food intake: Avoid eating anything solid for at least 8 hours prior to your procedure. Clear liquid intake: You may take clear liquids such as water up to 2 hours prior to your procedure. (No carbonated drinks. No soda.) Transportation: Unless otherwise stated by your physician, bring a driver. Morning Medicines: Except for blood thinners, take all of your other morning medications with a sip of water. Make sure to take your heart and blood pressure medicines. If your blood pressure's lower number is above 100, the case will be rescheduled. Blood thinners: Make sure to stop your blood thinners as instructed.  If you take a blood thinner, but were not instructed to stop it, call our office [phone] and ask to talk to a nurse. Not stopping a blood thinner prior to certain  procedures could lead to serious complications. Diabetics on insulin: Notify the staff so that you can be scheduled 1st case in the morning. If your diabetes requires high dose insulin, take only  of your normal insulin dose the morning of the procedure and notify the staff that you have done so. Preventing infections: Shower with an antibacterial soap the morning of your procedure.  Build-up your immune system: Take 1000 mg of Vitamin C with every meal (3 times a day) the day prior to your procedure. Antibiotics: Inform the nursing staff if you are taking any antibiotics or if you have any conditions that may require antibiotics prior to procedures. (Example: recent joint implants)   Pregnancy: If you are pregnant make sure to notify the nursing staff. Not doing so may result in injury to the fetus, including death.  Sickness: If you have a cold, fever, or any active infections, call and cancel or reschedule your procedure. Receiving steroids while having an infection may result in complications. Arrival: You must be in the facility at least 30 minutes prior to your scheduled procedure. Tardiness: Your scheduled time is also the cutoff time. If you do not arrive at least 15 minutes prior to your procedure, you will be rescheduled.  Children: Do not bring any children with you. Make arrangements to keep them home. Dress appropriately: There is always a possibility that your clothing may get soiled. Avoid long dresses. Valuables: Do not bring any jewelry or valuables.  Reasons to call and reschedule or cancel your procedure: (Following these recommendations will minimize the risk of a serious complication.) Surgeries: Avoid having procedures within 2 weeks of any surgery. (Avoid for 2 weeks before or after any surgery). Flu Shots: Avoid having procedures within 2  weeks of a flu shots or . (Avoid for 2 weeks before or after immunizations). Barium: Avoid having a procedure within 7-10 days after having  had a radiological study involving the use of radiological contrast. (Myelograms, Barium swallow or enema study). Heart attacks: Avoid any elective procedures or surgeries for the initial 6 months after a "Myocardial Infarction" (Heart Attack). Blood thinners: It is imperative that you stop these medications before procedures. Let us know if you if you take any blood thinner.  Infection: Avoid procedures during or within two weeks of an infection (including chest colds or gastrointestinal problems). Symptoms associated with infections include: Localized redness, fever, chills, night sweats or profuse sweating, burning sensation when voiding, cough, congestion, stuffiness, runny nose, sore throat, diarrhea, nausea, vomiting, cold or Flu symptoms, recent or current infections. It is specially important if the infection is over the area that we intend to treat. Heart and lung problems: Symptoms that may suggest an active cardiopulmonary problem include: cough, chest pain, breathing difficulties or shortness of breath, dizziness, ankle swelling, uncontrolled high or unusually low blood pressure, and/or palpitations. If you are experiencing any of these symptoms, cancel your procedure and contact your primary care physician for an evaluation.  Remember:  Regular Business hours are:  Monday to Thursday 8:00 AM to 4:00 PM  Provider's Schedule: Milinda Pointer, MD:  Procedure days: Tuesday and Thursday 7:30 AM to 4:00 PM  Gillis Santa, MD:  Procedure days: Monday and Wednesday 7:30 AM to 4:00 PM  ______________________________________________________________________   Radiofrequency Ablation Radiofrequency ablation is a procedure that is performed to relieve pain. The procedure is often used for back, neck, or arm pain. Radiofrequency ablation involves the use of a machine that creates radio waves to make heat. During the procedure, the heat is applied to the nerve that carries the pain signal. The  heat damages the nerve and interferes with the pain signal. Pain relief usually starts about 2 weeks after the procedure and lasts for 6 months to 1 year. Tell a health care provider about: Any allergies you have. All medicines you are taking, including vitamins, herbs, eye drops, creams, and over-the-counter medicines. Any problems you or family members have had with anesthetic medicines. Any bleeding problems you have. Any surgeries you have had. Any medical conditions you have. Whether you are pregnant or may be pregnant. What are the risks? Generally, this is a safe procedure. However, problems may occur, including: Pain or soreness at the injection site. Allergic reaction to medicines given during the procedure. Bleeding. Infection at the injection site. Damage to nerves or blood vessels. What happens before the procedure? When to stop eating and drinking Follow instructions from your health care provider about what you may eat and drink before your procedure. These may include: 8 hours before the procedure Stop eating most foods. Do not eat meat, fried foods, or fatty foods. Eat only light foods, such as toast or crackers. All liquids are okay except energy drinks and alcohol. 6 hours before the procedure Stop eating. Drink only clear liquids, such as water, clear fruit juice, black coffee, plain tea, and sports drinks. Do not drink energy drinks or alcohol. 2 hours before the procedure Stop drinking all liquids. You may be allowed to take medicine with small sips of water. If you do not follow your health care provider's instructions, your procedure may be delayed or canceled. Medicines Ask your health care provider about: Changing or stopping your regular medicines. This is especially important if you are  taking diabetes medicines or blood thinners. Taking medicines such as aspirin and ibuprofen. These medicines can thin your blood. Do not take these medicines unless your  health care provider tells you to take them. Taking over-the-counter medicines, vitamins, herbs, and supplements. General instructions Ask your health care provider what steps will be taken to help prevent infection. These steps may include: Removing hair at the procedure site. Washing skin with a germ-killing soap. Taking antibiotic medicine. If you will be going home right after the procedure, plan to have a responsible adult: Take you home from the hospital or clinic. You will not be allowed to drive. Care for you for the time you are told. What happens during the procedure?  You will be awake during the procedure. You will need to be able to talk with the health care provider during the procedure. An IV will be inserted into one of your veins. You will be given one or more of the following: A medicine to help you relax (sedative). A medicine to numb the area (local anesthetic). Your health care provider will insert a radiofrequency needle into the area to be treated. This is done with the help of fluoroscopy. A wire that carries the radio waves (electrode) will be put through the radiofrequency needle. An electrical pulse will be sent through the electrode to verify the correct nerve that is causing your pain. You will feel a tingling sensation, and you may have muscle twitching. The tissue around the needle tip will be heated by an electric current that comes from the radiofrequency machine. This will numb the nerves. The needle will be removed. A bandage (dressing) will be put on the insertion area. The procedure may vary among health care providers and hospitals. What happens after the procedure? Your blood pressure, heart rate, breathing rate, and blood oxygen level will be monitored until you leave the hospital or clinic. Return to your normal activities as told by your health care provider. Ask your health care provider what activities are safe for you. If you were given a  sedative during the procedure, it can affect you for several hours. Do not drive or operate machinery until your health care provider says that it is safe. Summary Radiofrequency ablation is a procedure that is performed to relieve pain. The procedure is often used for back, neck, or arm pain. Radiofrequency ablation involves the use of a machine that creates radio waves to make heat. Plan to have a responsible adult take you home from the hospital or clinic. Do not drive or operate machinery until your health care provider says that it is safe. Return to your normal activities as told by your health care provider. Ask your health care provider what activities are safe for you. This information is not intended to replace advice given to you by your health care provider. Make sure you discuss any questions you have with your health care provider. Document Revised: 07/31/2020 Document Reviewed: 07/31/2020 [employer] Patient Education  [place].

## 2022-04-17 DIAGNOSIS — L400 Psoriasis vulgaris: Secondary | ICD-10-CM | POA: Diagnosis not present

## 2022-04-21 ENCOUNTER — Ambulatory Visit: Payer: Medicare Other | Attending: Cardiovascular Disease

## 2022-04-21 DIAGNOSIS — R002 Palpitations: Secondary | ICD-10-CM

## 2022-04-22 LAB — CUP PACEART REMOTE DEVICE CHECK
Date Time Interrogation Session: 20240221231250
Implantable Pulse Generator Implant Date: 20230629

## 2022-04-23 ENCOUNTER — Inpatient Hospital Stay: Payer: Medicare Other

## 2022-04-23 VITALS — BP 126/63 | HR 76

## 2022-04-23 DIAGNOSIS — D631 Anemia in chronic kidney disease: Secondary | ICD-10-CM | POA: Diagnosis not present

## 2022-04-23 DIAGNOSIS — I130 Hypertensive heart and chronic kidney disease with heart failure and stage 1 through stage 4 chronic kidney disease, or unspecified chronic kidney disease: Secondary | ICD-10-CM | POA: Diagnosis not present

## 2022-04-23 DIAGNOSIS — N183 Chronic kidney disease, stage 3 unspecified: Secondary | ICD-10-CM

## 2022-04-23 DIAGNOSIS — D649 Anemia, unspecified: Secondary | ICD-10-CM

## 2022-04-23 DIAGNOSIS — Z79899 Other long term (current) drug therapy: Secondary | ICD-10-CM | POA: Diagnosis not present

## 2022-04-23 LAB — HEMOGLOBIN AND HEMATOCRIT, BLOOD
HCT: 25.9 % — ABNORMAL LOW (ref 36.0–46.0)
Hemoglobin: 7.7 g/dL — ABNORMAL LOW (ref 12.0–15.0)

## 2022-04-23 MED ORDER — EPOETIN ALFA-EPBX 10000 UNIT/ML IJ SOLN
30000.0000 [IU] | Freq: Once | INTRAMUSCULAR | Status: AC
  Administered 2022-04-23: 30000 [IU] via SUBCUTANEOUS
  Filled 2022-04-23: qty 3

## 2022-04-29 ENCOUNTER — Other Ambulatory Visit: Payer: Self-pay | Admitting: Oncology

## 2022-05-01 DIAGNOSIS — L4050 Arthropathic psoriasis, unspecified: Secondary | ICD-10-CM | POA: Diagnosis not present

## 2022-05-01 DIAGNOSIS — L409 Psoriasis, unspecified: Secondary | ICD-10-CM | POA: Diagnosis not present

## 2022-05-01 DIAGNOSIS — M1A00X0 Idiopathic chronic gout, unspecified site, without tophus (tophi): Secondary | ICD-10-CM | POA: Diagnosis not present

## 2022-05-01 DIAGNOSIS — Z79899 Other long term (current) drug therapy: Secondary | ICD-10-CM | POA: Diagnosis not present

## 2022-05-01 DIAGNOSIS — M159 Polyosteoarthritis, unspecified: Secondary | ICD-10-CM | POA: Diagnosis not present

## 2022-05-05 ENCOUNTER — Ambulatory Visit
Payer: Medicare Other | Attending: Student in an Organized Health Care Education/Training Program | Admitting: Student in an Organized Health Care Education/Training Program

## 2022-05-05 ENCOUNTER — Ambulatory Visit
Admission: RE | Admit: 2022-05-05 | Discharge: 2022-05-05 | Disposition: A | Discharge status: Home / Self Care | Payer: Medicare Other | Source: Ambulatory Visit | Attending: Student in an Organized Health Care Education/Training Program | Admitting: Student in an Organized Health Care Education/Training Program

## 2022-05-05 ENCOUNTER — Encounter: Payer: Self-pay | Admitting: Student in an Organized Health Care Education/Training Program

## 2022-05-05 DIAGNOSIS — D631 Anemia in chronic kidney disease: Secondary | ICD-10-CM | POA: Diagnosis not present

## 2022-05-05 DIAGNOSIS — M25562 Pain in left knee: Secondary | ICD-10-CM | POA: Insufficient documentation

## 2022-05-05 DIAGNOSIS — G8929 Other chronic pain: Secondary | ICD-10-CM | POA: Insufficient documentation

## 2022-05-05 DIAGNOSIS — M5416 Radiculopathy, lumbar region: Secondary | ICD-10-CM | POA: Diagnosis not present

## 2022-05-05 DIAGNOSIS — G894 Chronic pain syndrome: Secondary | ICD-10-CM | POA: Insufficient documentation

## 2022-05-05 DIAGNOSIS — N2581 Secondary hyperparathyroidism of renal origin: Secondary | ICD-10-CM | POA: Diagnosis not present

## 2022-05-05 DIAGNOSIS — I10 Essential (primary) hypertension: Secondary | ICD-10-CM | POA: Diagnosis not present

## 2022-05-05 DIAGNOSIS — Z96652 Presence of left artificial knee joint: Secondary | ICD-10-CM | POA: Insufficient documentation

## 2022-05-05 DIAGNOSIS — N1832 Chronic kidney disease, stage 3b: Secondary | ICD-10-CM | POA: Diagnosis not present

## 2022-05-05 DIAGNOSIS — R809 Proteinuria, unspecified: Secondary | ICD-10-CM | POA: Diagnosis not present

## 2022-05-05 MED ORDER — METHYLPREDNISOLONE ACETATE 80 MG/ML IJ SUSP
80.0000 mg | Freq: Once | INTRAMUSCULAR | Status: AC
  Administered 2022-05-05: 80 mg via INTRA_ARTICULAR
  Filled 2022-05-05: qty 1

## 2022-05-05 MED ORDER — LIDOCAINE HCL 2 % IJ SOLN
20.0000 mL | Freq: Once | INTRAMUSCULAR | Status: AC
  Administered 2022-05-05: 100 mg
  Filled 2022-05-05: qty 40

## 2022-05-05 MED ORDER — ROPIVACAINE HCL 2 MG/ML IJ SOLN
9.0000 mL | Freq: Once | INTRAMUSCULAR | Status: AC
  Administered 2022-05-05: 9 mL via PERINEURAL
  Filled 2022-05-05: qty 20

## 2022-05-05 NOTE — Progress Notes (Signed)
PROVIDER NOTE: Interpretation of information contained herein should be left to medically-trained personnel. Specific patient instructions are provided elsewhere under "Patient Instructions" section of medical record. This document was created in part using STT-dictation technology, any transcriptional errors that may result from this process are unintentional.  Patient: Michele Moore Type: Established DOB: 1950-05-28 MRN: [id number] PCP: Melida Quitter, MD  Service: Procedure DOS: 05/05/2022 Setting: Ambulatory Location: Ambulatory outpatient facility Delivery: Face-to-face Provider: Gillis Santa, MD Specialty: Interventional Pain Management Specialty designation: 09 Location: Outpatient facility Ref. Prov.: Gillis Santa, MD    Primary Reason for Visit: Interventional Pain Management Treatment. CC: Knee Pain (Left )    Procedure:          Anesthesia, Analgesia, Anxiolysis:  Type: Therapeutic Superolateral, Superomedial, and Inferomedial, Genicular Nerve Radiofrequency Ablation (destruction).   #2  Region: Lateral, Anterior, and Medial aspects of the knee joint, above and below the knee joint proper. Level: Superior and inferior to the knee joint. Laterality: Left  Anesthesia: Local (1-2% Lidocaine)  Guidance: Fluoroscopy           Position: Supine   Indications: 1. 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 Lumbar radiculopathy   3. Chronic pain syndrome    Michele Moore has been dealing with the above chronic pain for longer than three months and has either failed to respond, was unable to tolerate, or simply did not get enough benefit from other more conservative therapies including, but not limited to: 1. Over-the-counter medications 2. Anti-inflammatory medications 3. Muscle relaxants 4. Membrane stabilizers 5. Opioids 6. Physical therapy and/or chiropractic manipulation 7. Modalities (Heat, ice, etc.) 8. Invasive techniques such as nerve blocks. Ms.  Moore has attained more than 50% relief of the pain from a series of diagnostic injections conducted in separate occasions.  Pain Score: Pre-procedure: 8 /10 Post-procedure: 3 /10    Pre-op H&P Assessment:  Michele Moore is a 72 y.o. (year old), female patient, seen today for interventional treatment. She  has a past surgical history that includes Carpal tunnel release; Rotator cuff repair (Right, 1995); Ankle surgery (Right); Tubal ligation; Colonoscopy (2000?); Upper gi endoscopy (2000?); Tonsillectomy; Fusion of talonavicular joint (Right, 08/03/2015); Colonoscopy with propofol (N/A, 10/22/2017); Esophagogastroduodenoscopy (egd) with propofol (N/A, 10/22/2017); polypectomy (N/A, 10/22/2017); Total knee revision (Right, 01/20/2018); Joint replacement (Right); Loop recorder implant (08/22/2021); and Total knee revision (Left, 10/09/2021). [name] has a current medication list which includes the following prescription(s): acetaminophen, albuterol, allopurinol, betamethasone dipropionate, calcipotriene, clobetasol ointment, diclofenac sodium, farxiga, furosemide, gabapentin, hydrocodone-acetaminophen, [START ON 05/23/2022] hydrocodone-acetaminophen, [START ON 06/22/2022] hydrocodone-acetaminophen, ketoconazole, levothyroxine, losartan, meclizine, metoprolol tartrate, montelukast, multiple vitamins-calcium, mupirocin ointment, nystatin cream, omega-3 acid ethyl esters, omeprazole, otezla, polyethyl glycol-propyl glycol, potassium chloride sa, tizanidine, triamcinolone cream, vitamin d3, [DISCONTINUED] fluticasone, and [DISCONTINUED] mometasone. Her primarily concern today is the Knee Pain (Left )  Initial Vital Signs:  Pulse/HCG Rate: 67ECG Heart Rate: 75 Temp: (!) 97.4 F (36.3 C) Resp: 16 BP: (!) 109/49 SpO2: 100 %  BMI: Estimated body mass index is 34.31 kg/m as calculated from the following:   Height as of this encounter: '5\' 11"'$  (1.803 m).   Weight as of this encounter: 246 lb (111.6 kg).  Risk  Assessment: Allergies: Reviewed. She is allergic to ampicillin, penicillins, vancomycin, and vibramycin [doxycycline calcium].  Allergy Precautions: None required Coagulopathies: Reviewed. None identified.  Blood-thinner therapy: None at this time Active Infection(s): Reviewed. None identified. Michele Moore is afebrile  Site Confirmation: Michele Moore was asked  to confirm the procedure and laterality before marking the site Procedure checklist: Completed Consent: Before the procedure and under the influence of no sedative(s), amnesic(s), or anxiolytics, the patient was informed of the treatment options, risks and possible complications. To fulfill our ethical and legal obligations, as recommended by the American Medical Association's Code of Ethics, I have informed the patient of my clinical impression; the nature and purpose of the treatment or procedure; the risks, benefits, and possible complications of the intervention; the alternatives, including doing nothing; the risk(s) and benefit(s) of the alternative treatment(s) or procedure(s); and the risk(s) and benefit(s) of doing nothing. The patient was provided information about the general risks and possible complications associated with the procedure. These may include, but are not limited to: failure to achieve desired goals, infection, bleeding, organ or nerve damage, allergic reactions, paralysis, and death. In addition, the patient was informed of those risks and complications associated to the procedure, such as failure to decrease pain; infection; bleeding; organ or nerve damage with subsequent damage to sensory, motor, and/or autonomic systems, resulting in permanent pain, numbness, and/or weakness of one or several areas of the body; allergic reactions; (i.e.: anaphylactic reaction); and/or death. Furthermore, the patient was informed of those risks and complications associated with the medications. These include, but are not limited to: allergic  reactions (i.e.: anaphylactic or anaphylactoid reaction(s)); adrenal axis suppression; blood sugar elevation that in diabetics may result in ketoacidosis or comma; water retention that in patients with history of congestive heart failure may result in shortness of breath, pulmonary edema, and decompensation with resultant heart failure; weight gain; swelling or edema; medication-induced neural toxicity; particulate matter embolism and blood vessel occlusion with resultant organ, and/or nervous system infarction; and/or aseptic necrosis of one or more joints. Finally, the patient was informed that Medicine is not an exact science; therefore, there is also the possibility of unforeseen or unpredictable risks and/or possible complications that may result in a catastrophic outcome. The patient indicated having understood very clearly. We have given the patient no guarantees and we have made no promises. Enough time was given to the patient to ask questions, all of which were answered to the patient's satisfaction. Ms. Rackow has indicated that she wanted to continue with the procedure. Attestation: I, the ordering provider, attest that I have discussed with the patient the benefits, risks, side-effects, alternatives, likelihood of achieving goals, and potential problems during recovery for the procedure that I have provided informed consent. Date  Time: 05/05/2022  9:45 AM  Pre-Procedure Preparation:  Monitoring: As per clinic protocol. Respiration, ETCO2, SpO2, BP, heart rate and rhythm monitor placed and checked for adequate function Safety Precautions: Patient was assessed for positional comfort and pressure points before starting the procedure. Time-out: I initiated and conducted the "Time-out" before starting the procedure, as per protocol. The patient was asked to participate by confirming the accuracy of the "Time Out" information. Verification of the correct person, site, and procedure were performed and  confirmed by me, the nursing staff, and the patient. "Time-out" conducted as per Joint Commission's Universal Protocol (UP.01.01.01). Time: 1048  Description of Procedure:          Target Area: For Genicular Nerve radiofrequency ablation (destruction), the targets are: the superolateral genicular nerve, located in the lateral distal portion of the femoral shaft as it curves to form the lateral epicondyle, in the region of the distal femoral metaphysis; the superomedial genicular nerve, located in the medial distal portion of the femoral shaft as it  curves to form the medial epicondyle; and the inferomedial genicular nerve, located in the medial, proximal portion of the tibial shaft, as it curves to form the medial epicondyle, in the region of the proximal tibial metaphysis. Approach: Anterior, ipsilateral approach. Area Prepped: Entire knee area, from mid-thigh to mid-shin, lateral, anterior, and medial aspects. DuraPrep (Iodine Povacrylex [0.7% available iodine] and Isopropyl Alcohol, 74% w/w) Safety Precautions: Aspiration looking for blood return was conducted prior to all injections. At no point did we inject any substances, as a needle was being advanced. No attempts were made at seeking any paresthesias. Safe injection practices and needle disposal techniques used. Medications properly checked for expiration dates. SDV (single dose vial) medications used. Description of the Procedure: Protocol guidelines were followed. The patient was placed in position over the procedure table. The target area was identified and the area prepped in the usual manner. The skin and muscle were infiltrated with local anesthetic. Appropriate amount of time allowed to pass for local anesthetics to take effect. Radiofrequency needles were introduced to the target area using fluoroscopic guidance. Using the NeuroTherm NT1100 Radiofrequency Generator, sensory stimulation using 50 Hz was used to locate & identify the nerve,  making sure that the needle was positioned such that there was no sensory stimulation below 0.3 V or above 0.7 V. Stimulation using 2 Hz was used to evaluate the motor component. Care was taken not to lesion any nerves that demonstrated motor stimulation of the lower extremities at an output of less than 2.5 times that of the sensory threshold, or a maximum of 2.0 V. Once satisfactory placement of the needles was achieved, the numbing solution was slowly injected after negative aspiration. After waiting for at least 2 minutes, the ablation was performed at 80 degrees C for 60 seconds, using regular Radiofrequency settings. Once the procedure was completed, the needles were then removed and the area cleansed, making sure to leave some of the prepping solution back to take advantage of its long term bactericidal properties. Intra-operative Compliance: Compliant      Vitals:   05/05/22 1047 05/05/22 1052 05/05/22 1058 05/05/22 1102  BP: (!) 114/55 (!) 150/87 119/84 135/71  Pulse:      Resp: '18 16 18 18  '$ Temp:      TempSrc:      SpO2: 100% 100% 95% 100%  Weight:      Height:        Start Time: 1049 hrs. End Time: 1102 hrs. Materials & Medications:  Needle(s) Type: Teflon-coated, curved tip, Radiofrequency needle(s) Gauge: 22G Length: 10cm Medication(s): Please see orders for medications and dosing details.  6 cc solution made of 5 cc of 0.2% ropivacaine, 1 cc of Decadron 10 mg/cc.  3 cc injected at each level above for the left knee after sensorimotor testing, prior to lesioning.  Imaging Guidance (Non-Spinal):          Type of Imaging Technique: Fluoroscopy Guidance (Non-Spinal) Indication(s): Assistance in needle guidance and placement for procedures requiring needle placement in or near specific anatomical locations not easily accessible without such assistance. Exposure Time: Please see nurses notes. Contrast: Before injecting any contrast, we confirmed that the patient did not have  an allergy to iodine, shellfish, or radiological contrast. Once satisfactory needle placement was completed at the desired level, radiological contrast was injected. Contrast injected under live fluoroscopy. No contrast complications. See chart for type and volume of contrast used. Fluoroscopic Guidance: I was personally present during the use of fluoroscopy. "Tunnel Vision Technique"  used to obtain the best possible view of the target area. Parallax error corrected before commencing the procedure. "Direction-depth-direction" technique used to introduce the needle under continuous pulsed fluoroscopy. Once target was reached, antero-posterior, oblique, and lateral fluoroscopic projection used confirm needle placement in all planes. Images permanently stored in EMR. Interpretation: I personally interpreted the imaging intraoperatively. Adequate needle placement confirmed in multiple planes. Appropriate spread of contrast into desired area was observed. No evidence of afferent or efferent intravascular uptake. Permanent images saved into the patient's record.   Post-operative Assessment:  Post-procedure Vital Signs:  Pulse/HCG Rate: 6770 Temp: (!) 97.4 F (36.3 C) Resp: 18 BP:  135/71 SpO2: 100 %  EBL: None  Complications: No immediate post-treatment complications observed by team, or reported by patient.  Note: The patient tolerated the entire procedure well. A repeat set of vitals were taken after the procedure and the patient was kept under observation following institutional policy, for this type of procedure. Post-procedural neurological assessment was performed, showing return to baseline, prior to discharge. The patient was provided with post-procedure discharge instructions, including a section on how to identify potential problems. Should any problems arise concerning this procedure, the patient was given instructions to immediately contact us, at any time, without hesitation. In any case,  we plan to contact the patient by telephone for a follow-up status report regarding this interventional procedure.  Comments:  No additional relevant information.  Plan of Care  Orders:  Orders Placed This Encounter  Procedures   DG PAIN [facility] C-ARM 1-60 MIN NO REPORT    Intraoperative interpretation by procedural physician at [facility].    Standing Status:   Standing    Number of Occurrences:   1    Order Specific Question:   Reason for exam:    Answer:   Assistance in needle guidance and placement for procedures requiring needle placement in or near specific anatomical locations not easily accessible without such assistance.   Chronic Opioid Analgesic:  Norco 7.5 mg TID PRN #90/month, 22.5    Medications ordered for procedure: Meds ordered this encounter  Medications   lidocaine (XYLOCAINE) 2 % (with pres) injection 400 mg   methylPREDNISolone acetate (DEPO-MEDROL) injection 80 mg   ropivacaine (PF) 2 mg/mL (0.2%) (NAROPIN) injection 9 mL   Medications administered: We administered lidocaine, methylPREDNISolone acetate, and ropivacaine (PF) 2 mg/mL (0.2%).  See the medical record for exact dosing, route, and time of administration.  Follow-up plan:   Return in about 5 weeks (around 06/09/2022) for PPE, VV.       Recent Visits Date Type Provider Dept  04/15/22 Office Visit Gillis Santa, MD [facility]  02/10/22 Procedure visit Gillis Santa, MD [facility]  Showing recent visits within past 90 days and meeting all other requirements Today's Visits Date Type Provider Dept  05/05/22 Procedure visit Gillis Santa, MD [facility]  Showing today's visits and meeting all other requirements Future Appointments Date Type Provider Dept  06/09/22 Appointment Gillis Santa, MD [facility]  07/17/22 Appointment Gillis Santa, MD [facility]  Showing future appointments within next 90 days and meeting all other  requirements  Disposition: Discharge home  Discharge (Date  Time): 05/05/2022;   hrs.   Primary Care Physician: Mealor, Yetta Barre, MD Location: [facility] Note by: Gillis Santa, MD Date: 05/05/2022; Time: 11:30 AM  Disclaimer:  Medicine is not an exact science. The only guarantee in medicine is that nothing is guaranteed. It  is important to note that the decision to proceed with this intervention was based on the information collected from the patient. The Data and conclusions were drawn from the patient's questionnaire, the interview, and the physical examination. Because the information was provided in large part by the patient, it cannot be guaranteed that it has not been purposely or unconsciously manipulated. Every effort has been made to obtain as much relevant data as possible for this evaluation. It is important to note that the conclusions that lead to this procedure are derived in large part from the available data. Always take into account that the treatment will also be dependent on availability of resources and existing treatment guidelines, considered by other Pain Management Practitioners as being common knowledge and practice, at the time of the intervention. For Medico-Legal purposes, it is also important to point out that variation in procedural techniques and pharmacological choices are the acceptable norm. The indications, contraindications, technique, and results of the above procedure should only be interpreted and judged by a Board-Certified Interventional Pain Specialist with extensive familiarity and expertise in the same exact procedure and technique.

## 2022-05-05 NOTE — Progress Notes (Signed)
Carelink Summary Report / Loop Recorder 

## 2022-05-05 NOTE — Patient Instructions (Signed)
Radiofrequency Ablation Radiofrequency ablation is a procedure that is performed to relieve pain. The procedure is often used for back, neck, or arm pain. Radiofrequency ablation involves the use of a machine that creates radio waves to make heat. During the procedure, the heat is applied to the nerve that carries the pain signal. The heat damages the nerve and interferes with the pain signal. Pain relief usually starts about 2 weeks after the procedure and lasts for 6 months to 1 year. Tell a health care provider about: Any allergies you have. All medicines you are taking, including vitamins, herbs, eye drops, creams, and over-the-counter medicines. Any problems you or family members have had with anesthetic medicines. Any bleeding problems you have. Any surgeries you have had. Any medical conditions you have. Whether you are pregnant or may be pregnant. What are the risks? Generally, this is a safe procedure. However, problems may occur, including: Pain or soreness at the injection site. Allergic reaction to medicines given during the procedure. Bleeding. Infection at the injection site. Damage to nerves or blood vessels. What happens before the procedure? When to stop eating and drinking Follow instructions from your health care provider about what you may eat and drink before your procedure. These may include: 8 hours before the procedure Stop eating most foods. Do not eat meat, fried foods, or fatty foods. Eat only light foods, such as toast or crackers. All liquids are okay except energy drinks and alcohol. 6 hours before the procedure Stop eating. Drink only clear liquids, such as water, clear fruit juice, black coffee, plain tea, and sports drinks. Do not drink energy drinks or alcohol. 2 hours before the procedure Stop drinking all liquids. You may be allowed to take medicine with small sips of water. If you do not follow your health care provider's instructions, your  procedure may be delayed or canceled. Medicines Ask your health care provider about: Changing or stopping your regular medicines. This is especially important if you are taking diabetes medicines or blood thinners. Taking medicines such as aspirin and ibuprofen. These medicines can thin your blood. Do not take these medicines unless your health care provider tells you to take them. Taking over-the-counter medicines, vitamins, herbs, and supplements. General instructions Ask your health care provider what steps will be taken to help prevent infection. These steps may include: Removing hair at the procedure site. Washing skin with a germ-killing soap. Taking antibiotic medicine. If you will be going home right after the procedure, plan to have a responsible adult: Take you home from the hospital or clinic. You will not be allowed to drive. Care for you for the time you are told. What happens during the procedure?  You will be awake during the procedure. You will need to be able to talk with the health care provider during the procedure. An IV will be inserted into one of your veins. You will be given one or more of the following: A medicine to help you relax (sedative). A medicine to numb the area (local anesthetic). Your health care provider will insert a radiofrequency needle into the area to be treated. This is done with the help of fluoroscopy. A wire that carries the radio waves (electrode) will be put through the radiofrequency needle. An electrical pulse will be sent through the electrode to verify the correct nerve that is causing your pain. You will feel a tingling sensation, and you may have muscle twitching. The tissue around the needle tip will be heated by an  electric current that comes from the radiofrequency machine. This will numb the nerves. The needle will be removed. A bandage (dressing) will be put on the insertion area. The procedure may vary among health care providers  and hospitals. What happens after the procedure? Your blood pressure, heart rate, breathing rate, and blood oxygen level will be monitored until you leave the hospital or clinic. Return to your normal activities as told by your health care provider. Ask your health care provider what activities are safe for you. If you were given a sedative during the procedure, it can affect you for several hours. Do not drive or operate machinery until your health care provider says that it is safe. Summary Radiofrequency ablation is a procedure that is performed to relieve pain. The procedure is often used for back, neck, or arm pain. Radiofrequency ablation involves the use of a machine that creates radio waves to make heat. Plan to have a responsible adult take you home from the hospital or clinic. Do not drive or operate machinery until your health care provider says that it is safe. Return to your normal activities as told by your health care provider. Ask your health care provider what activities are safe for you. This information is not intended to replace advice given to you by your health care provider. Make sure you discuss any questions you have with your health care provider. Document Revised: 07/31/2020 Document Reviewed: 07/31/2020 Elsevier Patient Education  [place]. Pain Management Discharge Instructions  General Discharge Instructions :  If you need to reach your doctor call: Monday-Friday 8:00 am - 4:00 pm at [phone] or toll free [phone].  After clinic hours [phone] to have operator reach doctor.  Bring all of your medication bottles to all your appointments in the pain clinic.  To cancel or reschedule your appointment with Pain Management please remember to call 24 hours in advance to avoid a fee.  Refer to the educational materials which you have been given on: General Risks, I had my Procedure. Discharge Instructions, Post Sedation.  Post Procedure  Instructions:  The drugs you were given will stay in your system until tomorrow, so for the next 24 hours you should not drive, make any legal decisions or drink any alcoholic beverages.  You may eat anything you prefer, but it is better to start with liquids then soups and crackers, and gradually work up to solid foods.  Please notify your doctor immediately if you have any unusual bleeding, trouble breathing or pain that is not related to your normal pain.  Depending on the type of procedure that was done, some parts of your body may feel week and/or numb.  This usually clears up by tonight or the next day.  Walk with the use of an assistive device or accompanied by an adult for the 24 hours.  You may use ice on the affected area for the first 24 hours.  Put ice in a Ziploc bag and cover with a towel and place against area 15 minutes on 15 minutes off.  You may switch to heat after 24 hours.

## 2022-05-06 ENCOUNTER — Telehealth: Payer: Self-pay

## 2022-05-06 NOTE — Telephone Encounter (Signed)
Post procedure follow up.  LM 

## 2022-05-07 ENCOUNTER — Telehealth: Payer: Self-pay

## 2022-05-07 ENCOUNTER — Other Ambulatory Visit: Payer: Self-pay | Admitting: Oncology

## 2022-05-07 ENCOUNTER — Telehealth: Payer: Self-pay | Admitting: Student in an Organized Health Care Education/Training Program

## 2022-05-07 ENCOUNTER — Inpatient Hospital Stay: Payer: Medicare Other | Attending: Oncology

## 2022-05-07 ENCOUNTER — Inpatient Hospital Stay: Payer: Medicare Other

## 2022-05-07 ENCOUNTER — Other Ambulatory Visit: Payer: Self-pay

## 2022-05-07 VITALS — BP 131/68

## 2022-05-07 DIAGNOSIS — D649 Anemia, unspecified: Secondary | ICD-10-CM

## 2022-05-07 DIAGNOSIS — I130 Hypertensive heart and chronic kidney disease with heart failure and stage 1 through stage 4 chronic kidney disease, or unspecified chronic kidney disease: Secondary | ICD-10-CM | POA: Diagnosis not present

## 2022-05-07 DIAGNOSIS — N1832 Chronic kidney disease, stage 3b: Secondary | ICD-10-CM | POA: Insufficient documentation

## 2022-05-07 DIAGNOSIS — I5030 Unspecified diastolic (congestive) heart failure: Secondary | ICD-10-CM | POA: Insufficient documentation

## 2022-05-07 DIAGNOSIS — D631 Anemia in chronic kidney disease: Secondary | ICD-10-CM | POA: Diagnosis not present

## 2022-05-07 DIAGNOSIS — N183 Chronic kidney disease, stage 3 unspecified: Secondary | ICD-10-CM

## 2022-05-07 LAB — PREPARE RBC (CROSSMATCH)

## 2022-05-07 LAB — HEMOGLOBIN AND HEMATOCRIT, BLOOD
HCT: 20.9 % — ABNORMAL LOW (ref 36.0–46.0)
Hemoglobin: 6.3 g/dL — CL (ref 12.0–15.0)

## 2022-05-07 MED ORDER — EPOETIN ALFA-EPBX 40000 UNIT/ML IJ SOLN
40000.0000 [IU] | Freq: Once | INTRAMUSCULAR | Status: AC
  Administered 2022-05-07: 40000 [IU] via SUBCUTANEOUS
  Filled 2022-05-07: qty 1

## 2022-05-07 NOTE — Telephone Encounter (Signed)
-----   Message from Stephens November sent at 05/07/2022  3:30 PM EDT ----- Regarding: Scheduling PER [place] [facility] , hold tube on 3/18 + retacrit and possible 3/19 PRBC transfusion. Pt has been scheduled and will receive print out after done with INF

## 2022-05-07 NOTE — Telephone Encounter (Signed)
Attempted to call patient.  LM for her to return our call.

## 2022-05-07 NOTE — Telephone Encounter (Signed)
PT stated that both of her knees is hurting. Please give patient a call. TY

## 2022-05-07 NOTE — Telephone Encounter (Signed)
Pt's hgb 6.3 today.  She has been scheduled for blood tomorrow. Confired with pt that she is not taking MTX. Pt advised to follow up with GI for workup, but she said that she saw Dr. Allen Norris in December. He wanted to evaluate her esphagus but he is scared to put her to sleep because she has a pacemaker, so she said there was no sense in seeing him.  Pt has been added for lab and poss blood transfusion next week as requested by MD. Appt details given to pt.

## 2022-05-08 ENCOUNTER — Inpatient Hospital Stay: Payer: Medicare Other

## 2022-05-08 DIAGNOSIS — D631 Anemia in chronic kidney disease: Secondary | ICD-10-CM | POA: Diagnosis not present

## 2022-05-08 DIAGNOSIS — N1832 Chronic kidney disease, stage 3b: Secondary | ICD-10-CM | POA: Diagnosis not present

## 2022-05-08 DIAGNOSIS — I5030 Unspecified diastolic (congestive) heart failure: Secondary | ICD-10-CM | POA: Diagnosis not present

## 2022-05-08 DIAGNOSIS — D649 Anemia, unspecified: Secondary | ICD-10-CM

## 2022-05-08 DIAGNOSIS — I130 Hypertensive heart and chronic kidney disease with heart failure and stage 1 through stage 4 chronic kidney disease, or unspecified chronic kidney disease: Secondary | ICD-10-CM | POA: Diagnosis not present

## 2022-05-08 MED ORDER — ACETAMINOPHEN 325 MG PO TABS
650.0000 mg | ORAL_TABLET | Freq: Once | ORAL | Status: AC
  Administered 2022-05-08: 650 mg via ORAL

## 2022-05-08 MED ORDER — SODIUM CHLORIDE 0.9% IV SOLUTION
250.0000 mL | Freq: Once | INTRAVENOUS | Status: AC
  Administered 2022-05-08: 250 mL via INTRAVENOUS
  Filled 2022-05-08: qty 250

## 2022-05-08 MED ORDER — DIPHENHYDRAMINE HCL 25 MG PO CAPS
25.0000 mg | ORAL_CAPSULE | Freq: Once | ORAL | Status: AC
  Administered 2022-05-08: 25 mg via ORAL

## 2022-05-08 NOTE — Patient Instructions (Signed)
Blood Transfusion, Adult, Care After The following information offers guidance on how to care for yourself after your procedure. Your health care provider may also give you more specific instructions. If you have problems or questions, contact your health care provider. What can I expect after the procedure? After the procedure, it is common to have: Bruising and soreness where the IV was inserted. A headache. Follow these instructions at home: IV insertion site care     Follow instructions from your health care provider about how to take care of your IV insertion site. Make sure you: Wash your hands with soap and water for at least 20 seconds before and after you change your bandage (dressing). If soap and water are not available, use hand sanitizer. Change your dressing as told by your health care provider. Check your IV insertion site every day for signs of infection. Check for: Redness, swelling, or pain. Bleeding from the site. Warmth. Pus or a bad smell. General instructions Take over-the-counter and prescription medicines only as told by your health care provider. Rest as told by your health care provider. Return to your normal activities as told by your health care provider. Keep all follow-up visits. Lab tests may need to be done at certain periods to recheck your blood counts. Contact a health care provider if: You have itching or red, swollen areas of skin (hives). You have a fever or chills. You have pain in the head, back, or chest. You feel anxious or you feel weak after doing your normal activities. You have redness, swelling, warmth, or pain around the IV insertion site. You have blood coming from the IV insertion site that does not stop with pressure. You have pus or a bad smell coming from your IV insertion site. If you received your blood transfusion in an outpatient setting, you will be told whom to contact to report any reactions. Get help right away if: You  have symptoms of a serious allergic or immune system reaction, including: Trouble breathing or shortness of breath. Swelling of the face, feeling flushed, or widespread rash. Dark urine or blood in the urine. Fast heartbeat. These symptoms may be an emergency. Get help right away. Call 911. Do not wait to see if the symptoms will go away. Do not drive yourself to the hospital. Summary Bruising and soreness around the IV insertion site are common. Check your IV insertion site every day for signs of infection. Rest as told by your health care provider. Return to your normal activities as told by your health care provider. Get help right away for symptoms of a serious allergic or immune system reaction to the blood transfusion. This information is not intended to replace advice given to you by your health care provider. Make sure you discuss any questions you have with your health care provider. Document Revised: 05/10/2021 Document Reviewed: 05/10/2021 Elsevier Patient Education  2023 Elsevier Inc.  

## 2022-05-09 ENCOUNTER — Other Ambulatory Visit: Payer: Self-pay

## 2022-05-09 DIAGNOSIS — D649 Anemia, unspecified: Secondary | ICD-10-CM

## 2022-05-09 LAB — BPAM RBC
Blood Product Expiration Date: 202404172359
ISSUE DATE / TIME: 202403140929
Unit Type and Rh: 5100

## 2022-05-09 LAB — TYPE AND SCREEN
ABO/RH(D): O POS
Antibody Screen: POSITIVE
Unit division: 0

## 2022-05-12 ENCOUNTER — Other Ambulatory Visit: Payer: Self-pay

## 2022-05-12 ENCOUNTER — Inpatient Hospital Stay: Payer: Medicare Other

## 2022-05-12 ENCOUNTER — Other Ambulatory Visit: Payer: Self-pay | Admitting: Oncology

## 2022-05-12 ENCOUNTER — Ambulatory Visit: Payer: Medicare Other

## 2022-05-12 ENCOUNTER — Other Ambulatory Visit: Payer: Medicare Other

## 2022-05-12 VITALS — BP 119/64 | HR 66

## 2022-05-12 DIAGNOSIS — N1832 Chronic kidney disease, stage 3b: Secondary | ICD-10-CM | POA: Diagnosis not present

## 2022-05-12 DIAGNOSIS — I130 Hypertensive heart and chronic kidney disease with heart failure and stage 1 through stage 4 chronic kidney disease, or unspecified chronic kidney disease: Secondary | ICD-10-CM | POA: Diagnosis not present

## 2022-05-12 DIAGNOSIS — I5030 Unspecified diastolic (congestive) heart failure: Secondary | ICD-10-CM | POA: Diagnosis not present

## 2022-05-12 DIAGNOSIS — D649 Anemia, unspecified: Secondary | ICD-10-CM

## 2022-05-12 DIAGNOSIS — D631 Anemia in chronic kidney disease: Secondary | ICD-10-CM | POA: Diagnosis not present

## 2022-05-12 DIAGNOSIS — N183 Chronic kidney disease, stage 3 unspecified: Secondary | ICD-10-CM

## 2022-05-12 LAB — CBC WITH DIFFERENTIAL (CANCER CENTER ONLY)
Abs Immature Granulocytes: 0.01 10*3/uL (ref 0.00–0.07)
Basophils Absolute: 0 10*3/uL (ref 0.0–0.1)
Basophils Relative: 1 %
Eosinophils Absolute: 0.2 10*3/uL (ref 0.0–0.5)
Eosinophils Relative: 4 %
HCT: 25.7 % — ABNORMAL LOW (ref 36.0–46.0)
Hemoglobin: 7.7 g/dL — ABNORMAL LOW (ref 12.0–15.0)
Immature Granulocytes: 0 %
Lymphocytes Relative: 19 %
Lymphs Abs: 1.2 10*3/uL (ref 0.7–4.0)
MCH: 27 pg (ref 26.0–34.0)
MCHC: 30 g/dL (ref 30.0–36.0)
MCV: 90.2 fL (ref 80.0–100.0)
Monocytes Absolute: 0.4 10*3/uL (ref 0.1–1.0)
Monocytes Relative: 6 %
Neutro Abs: 4.4 10*3/uL (ref 1.7–7.7)
Neutrophils Relative %: 70 %
Platelet Count: 230 10*3/uL (ref 150–400)
RBC: 2.85 MIL/uL — ABNORMAL LOW (ref 3.87–5.11)
RDW: 17.3 % — ABNORMAL HIGH (ref 11.5–15.5)
WBC Count: 6.3 10*3/uL (ref 4.0–10.5)
nRBC: 0 % (ref 0.0–0.2)

## 2022-05-12 LAB — PREPARE RBC (CROSSMATCH)

## 2022-05-12 MED ORDER — EPOETIN ALFA-EPBX 40000 UNIT/ML IJ SOLN
40000.0000 [IU] | Freq: Once | INTRAMUSCULAR | Status: AC
  Administered 2022-05-12: 40000 [IU] via SUBCUTANEOUS
  Filled 2022-05-12: qty 1

## 2022-05-13 ENCOUNTER — Inpatient Hospital Stay: Payer: Medicare Other

## 2022-05-13 DIAGNOSIS — D649 Anemia, unspecified: Secondary | ICD-10-CM

## 2022-05-13 DIAGNOSIS — N1832 Chronic kidney disease, stage 3b: Secondary | ICD-10-CM | POA: Diagnosis not present

## 2022-05-13 DIAGNOSIS — D631 Anemia in chronic kidney disease: Secondary | ICD-10-CM | POA: Diagnosis not present

## 2022-05-13 DIAGNOSIS — I130 Hypertensive heart and chronic kidney disease with heart failure and stage 1 through stage 4 chronic kidney disease, or unspecified chronic kidney disease: Secondary | ICD-10-CM | POA: Diagnosis not present

## 2022-05-13 DIAGNOSIS — I5030 Unspecified diastolic (congestive) heart failure: Secondary | ICD-10-CM | POA: Diagnosis not present

## 2022-05-13 MED ORDER — SODIUM CHLORIDE 0.9% IV SOLUTION
250.0000 mL | Freq: Once | INTRAVENOUS | Status: AC
  Administered 2022-05-13: 250 mL via INTRAVENOUS
  Filled 2022-05-13: qty 250

## 2022-05-13 MED ORDER — ACETAMINOPHEN 325 MG PO TABS
650.0000 mg | ORAL_TABLET | Freq: Once | ORAL | Status: AC
  Administered 2022-05-13: 650 mg via ORAL
  Filled 2022-05-13: qty 2

## 2022-05-13 MED ORDER — DIPHENHYDRAMINE HCL 25 MG PO CAPS
25.0000 mg | ORAL_CAPSULE | Freq: Once | ORAL | Status: AC
  Administered 2022-05-13: 25 mg via ORAL
  Filled 2022-05-13: qty 1

## 2022-05-14 LAB — TYPE AND SCREEN
ABO/RH(D): O POS
Antibody Screen: NEGATIVE
Unit division: 0

## 2022-05-14 LAB — BPAM RBC
Blood Product Expiration Date: 202404182359
ISSUE DATE / TIME: 202403191126
Unit Type and Rh: 5100

## 2022-05-17 ENCOUNTER — Other Ambulatory Visit: Payer: Self-pay | Admitting: Family Medicine

## 2022-05-17 DIAGNOSIS — E039 Hypothyroidism, unspecified: Secondary | ICD-10-CM

## 2022-05-19 NOTE — Telephone Encounter (Signed)
Requested medications are due for refill today.  No   Requested medications are on the active medications list.  no  Last refill.   Future visit scheduled.   yes  Notes to clinic.  Doralee Albino listed as PCP. Dosage changed.    Requested Prescriptions  Pending Prescriptions Disp Refills   levothyroxine (SYNTHROID) 175 MCG tablet [Pharmacy Med Name: LEVOTHYROXINE 0.175MG  (175MCG) TABS] 90 tablet 1    Sig: TAKE 1 TABLET(175 MCG) BY MOUTH DAILY     Endocrinology:  Hypothyroid Agents Passed - 05/17/2022  6:20 AM      Passed - TSH in normal range and within 360 days    TSH  Date Value Ref Range Status  03/27/2022 0.499 0.450 - 4.500 uIU/mL Final         Passed - Valid encounter within last 12 months    Recent Outpatient Visits           1 month ago Hypothyroidism, unspecified type   [facility] [place], Deanna C, MD   4 months ago Hypotension due to hypovolemia   [facility] [place], [place], MD   5 months ago Hypothyroidism, unspecified type   [facility] [place], [place], MD   8 months ago Hypertension, unspecified type   [facility] [place], Deanna C, MD   1 year ago Hypertension, unspecified type   [facility] at [place], [employer], MD       Future Appointments             In 4 months Juline Patch, MD [place] [facility], [facility]

## 2022-05-21 ENCOUNTER — Inpatient Hospital Stay: Payer: Medicare Other

## 2022-05-21 VITALS — BP 119/75

## 2022-05-21 DIAGNOSIS — D631 Anemia in chronic kidney disease: Secondary | ICD-10-CM | POA: Diagnosis not present

## 2022-05-21 DIAGNOSIS — I5030 Unspecified diastolic (congestive) heart failure: Secondary | ICD-10-CM | POA: Diagnosis not present

## 2022-05-21 DIAGNOSIS — N1832 Chronic kidney disease, stage 3b: Secondary | ICD-10-CM | POA: Diagnosis not present

## 2022-05-21 DIAGNOSIS — D649 Anemia, unspecified: Secondary | ICD-10-CM

## 2022-05-21 DIAGNOSIS — N183 Chronic kidney disease, stage 3 unspecified: Secondary | ICD-10-CM

## 2022-05-21 DIAGNOSIS — I130 Hypertensive heart and chronic kidney disease with heart failure and stage 1 through stage 4 chronic kidney disease, or unspecified chronic kidney disease: Secondary | ICD-10-CM | POA: Diagnosis not present

## 2022-05-21 LAB — HEMOGLOBIN AND HEMATOCRIT, BLOOD
HCT: 28 % — ABNORMAL LOW (ref 36.0–46.0)
Hemoglobin: 8.6 g/dL — ABNORMAL LOW (ref 12.0–15.0)

## 2022-05-21 MED ORDER — EPOETIN ALFA-EPBX 40000 UNIT/ML IJ SOLN
40000.0000 [IU] | Freq: Once | INTRAMUSCULAR | Status: AC
  Administered 2022-05-21: 40000 [IU] via SUBCUTANEOUS
  Filled 2022-05-21: qty 1

## 2022-05-26 ENCOUNTER — Ambulatory Visit (INDEPENDENT_AMBULATORY_CARE_PROVIDER_SITE_OTHER): Payer: Medicare Other

## 2022-05-26 DIAGNOSIS — R002 Palpitations: Secondary | ICD-10-CM | POA: Diagnosis not present

## 2022-05-27 ENCOUNTER — Telehealth: Payer: Self-pay | Admitting: Student in an Organized Health Care Education/Training Program

## 2022-05-27 ENCOUNTER — Telehealth: Payer: Self-pay | Admitting: *Deleted

## 2022-05-27 LAB — CUP PACEART REMOTE DEVICE CHECK
Date Time Interrogation Session: 20240331231615
Implantable Pulse Generator Implant Date: 20230629

## 2022-05-27 NOTE — Telephone Encounter (Signed)
PT called stated that [facility] in [place] on [address]. [phone]. Has the hydrocodone 7.5-325 mg in stock. Please give patient a call once prescription has been send in. TY

## 2022-05-27 NOTE — Telephone Encounter (Signed)
Received call from patient's pharmacy, [facility] stating that they do not have qty to fill her hydro - apap 7.5-325mg .  Message sent to Dr Holley Raring and he would like patient to call around to find who may have enough to fill her medication.  I left voicemail with patient to call around to find her medication and then ask [facility] if they can transfer the Rx.  If they will not do that for her I told her to call us back and we will put in a medication request with the appropriate pharmacy that does have qty to fill.

## 2022-05-28 ENCOUNTER — Other Ambulatory Visit: Payer: Self-pay

## 2022-05-28 MED ORDER — HYDROCODONE-ACETAMINOPHEN 7.5-325 MG PO TABS: 1.0000 | ORAL_TABLET | Freq: Three times a day (TID) | ORAL | 0 refills | Status: AC | PRN

## 2022-05-28 NOTE — Telephone Encounter (Signed)
Called and talked with patient about medication and that we will send to [facility].

## 2022-05-30 ENCOUNTER — Inpatient Hospital Stay: Payer: Medicare Other | Attending: Oncology

## 2022-05-30 ENCOUNTER — Other Ambulatory Visit: Payer: Self-pay

## 2022-05-30 DIAGNOSIS — E785 Hyperlipidemia, unspecified: Secondary | ICD-10-CM | POA: Insufficient documentation

## 2022-05-30 DIAGNOSIS — K219 Gastro-esophageal reflux disease without esophagitis: Secondary | ICD-10-CM | POA: Diagnosis not present

## 2022-05-30 DIAGNOSIS — E039 Hypothyroidism, unspecified: Secondary | ICD-10-CM | POA: Insufficient documentation

## 2022-05-30 DIAGNOSIS — Z7984 Long term (current) use of oral hypoglycemic drugs: Secondary | ICD-10-CM | POA: Insufficient documentation

## 2022-05-30 DIAGNOSIS — Z79899 Other long term (current) drug therapy: Secondary | ICD-10-CM | POA: Diagnosis not present

## 2022-05-30 DIAGNOSIS — Z79890 Hormone replacement therapy (postmenopausal): Secondary | ICD-10-CM | POA: Diagnosis not present

## 2022-05-30 DIAGNOSIS — M069 Rheumatoid arthritis, unspecified: Secondary | ICD-10-CM | POA: Insufficient documentation

## 2022-05-30 DIAGNOSIS — I5032 Chronic diastolic (congestive) heart failure: Secondary | ICD-10-CM | POA: Insufficient documentation

## 2022-05-30 DIAGNOSIS — M3500 Sicca syndrome, unspecified: Secondary | ICD-10-CM | POA: Diagnosis not present

## 2022-05-30 DIAGNOSIS — R809 Proteinuria, unspecified: Secondary | ICD-10-CM | POA: Insufficient documentation

## 2022-05-30 DIAGNOSIS — D631 Anemia in chronic kidney disease: Secondary | ICD-10-CM | POA: Diagnosis not present

## 2022-05-30 DIAGNOSIS — N183 Chronic kidney disease, stage 3 unspecified: Secondary | ICD-10-CM

## 2022-05-30 DIAGNOSIS — Z87891 Personal history of nicotine dependence: Secondary | ICD-10-CM | POA: Diagnosis not present

## 2022-05-30 DIAGNOSIS — Z86010 Personal history of colonic polyps: Secondary | ICD-10-CM | POA: Insufficient documentation

## 2022-05-30 DIAGNOSIS — N1832 Chronic kidney disease, stage 3b: Secondary | ICD-10-CM | POA: Insufficient documentation

## 2022-05-30 DIAGNOSIS — Z87442 Personal history of urinary calculi: Secondary | ICD-10-CM | POA: Diagnosis not present

## 2022-05-30 DIAGNOSIS — I130 Hypertensive heart and chronic kidney disease with heart failure and stage 1 through stage 4 chronic kidney disease, or unspecified chronic kidney disease: Secondary | ICD-10-CM | POA: Insufficient documentation

## 2022-05-30 DIAGNOSIS — D649 Anemia, unspecified: Secondary | ICD-10-CM

## 2022-05-30 LAB — CBC WITH DIFFERENTIAL/PLATELET
Abs Immature Granulocytes: 0.01 10*3/uL (ref 0.00–0.07)
Basophils Absolute: 0 10*3/uL (ref 0.0–0.1)
Basophils Relative: 1 %
Eosinophils Absolute: 0.2 10*3/uL (ref 0.0–0.5)
Eosinophils Relative: 4 %
HCT: 26.9 % — ABNORMAL LOW (ref 36.0–46.0)
Hemoglobin: 8.1 g/dL — ABNORMAL LOW (ref 12.0–15.0)
Immature Granulocytes: 0 %
Lymphocytes Relative: 19 %
Lymphs Abs: 1 10*3/uL (ref 0.7–4.0)
MCH: 26.5 pg (ref 26.0–34.0)
MCHC: 30.1 g/dL (ref 30.0–36.0)
MCV: 87.9 fL (ref 80.0–100.0)
Monocytes Absolute: 0.2 10*3/uL (ref 0.1–1.0)
Monocytes Relative: 4 %
Neutro Abs: 3.7 10*3/uL (ref 1.7–7.7)
Neutrophils Relative %: 72 %
Platelets: 202 10*3/uL (ref 150–400)
RBC: 3.06 MIL/uL — ABNORMAL LOW (ref 3.87–5.11)
RDW: 15.9 % — ABNORMAL HIGH (ref 11.5–15.5)
WBC: 5.2 10*3/uL (ref 4.0–10.5)
nRBC: 0 % (ref 0.0–0.2)

## 2022-05-30 LAB — FERRITIN: Ferritin: 21 ng/mL (ref 11–307)

## 2022-05-30 LAB — RETIC PANEL
Immature Retic Fract: 18 % — ABNORMAL HIGH (ref 2.3–15.9)
RBC.: 3.04 MIL/uL — ABNORMAL LOW (ref 3.87–5.11)
Retic Count, Absolute: 72.4 10*3/uL (ref 19.0–186.0)
Retic Ct Pct: 2.4 % (ref 0.4–3.1)
Reticulocyte Hemoglobin: 22.7 pg — ABNORMAL LOW (ref >27.9)

## 2022-05-30 LAB — IRON AND TIBC
Iron: 98 ug/dL (ref 28–170)
Saturation Ratios: 30 % (ref 10.4–31.8)
TIBC: 329 ug/dL (ref 250–450)
UIBC: 231 ug/dL

## 2022-06-02 LAB — KAPPA/LAMBDA LIGHT CHAINS
Kappa free light chain: 29.7 mg/L — ABNORMAL HIGH (ref 3.3–19.4)
Kappa, lambda light chain ratio: 1.31 (ref 0.26–1.65)
Lambda free light chains: 22.6 mg/L (ref 5.7–26.3)

## 2022-06-02 NOTE — Progress Notes (Signed)
Carelink Summary Report / Loop Recorder 

## 2022-06-04 ENCOUNTER — Inpatient Hospital Stay (HOSPITAL_BASED_OUTPATIENT_CLINIC_OR_DEPARTMENT_OTHER): Payer: Medicare Other | Admitting: Oncology

## 2022-06-04 ENCOUNTER — Inpatient Hospital Stay: Payer: Medicare Other

## 2022-06-04 VITALS — BP 108/57 | HR 70 | Temp 96.8°F | Ht 67.5 in | Wt 244.9 lb

## 2022-06-04 DIAGNOSIS — M069 Rheumatoid arthritis, unspecified: Secondary | ICD-10-CM | POA: Diagnosis not present

## 2022-06-04 DIAGNOSIS — N1832 Chronic kidney disease, stage 3b: Secondary | ICD-10-CM | POA: Diagnosis not present

## 2022-06-04 DIAGNOSIS — N183 Chronic kidney disease, stage 3 unspecified: Secondary | ICD-10-CM

## 2022-06-04 DIAGNOSIS — D649 Anemia, unspecified: Secondary | ICD-10-CM

## 2022-06-04 DIAGNOSIS — D631 Anemia in chronic kidney disease: Secondary | ICD-10-CM

## 2022-06-04 DIAGNOSIS — I130 Hypertensive heart and chronic kidney disease with heart failure and stage 1 through stage 4 chronic kidney disease, or unspecified chronic kidney disease: Secondary | ICD-10-CM | POA: Diagnosis not present

## 2022-06-04 DIAGNOSIS — Z79899 Other long term (current) drug therapy: Secondary | ICD-10-CM | POA: Diagnosis not present

## 2022-06-04 DIAGNOSIS — M3500 Sicca syndrome, unspecified: Secondary | ICD-10-CM | POA: Diagnosis not present

## 2022-06-04 LAB — MULTIPLE MYELOMA PANEL, SERUM
Albumin SerPl Elph-Mcnc: 3.2 g/dL (ref 2.9–4.4)
Albumin/Glob SerPl: 1.1 (ref 0.7–1.7)
Alpha 1: 0.4 g/dL (ref 0.0–0.4)
Alpha2 Glob SerPl Elph-Mcnc: 0.8 g/dL (ref 0.4–1.0)
B-Globulin SerPl Elph-Mcnc: 1 g/dL (ref 0.7–1.3)
Gamma Glob SerPl Elph-Mcnc: 0.9 g/dL (ref 0.4–1.8)
Globulin, Total: 3 g/dL (ref 2.2–3.9)
IgA: 156 mg/dL (ref 64–422)
IgG (Immunoglobin G), Serum: 790 mg/dL (ref 586–1602)
IgM (Immunoglobulin M), Srm: 159 mg/dL (ref 26–217)
Total Protein ELP: 6.2 g/dL (ref 6.0–8.5)

## 2022-06-04 MED ORDER — SODIUM CHLORIDE 0.9 % IV SOLN
Freq: Once | INTRAVENOUS | Status: AC
  Filled 2022-06-04: qty 250

## 2022-06-04 MED ORDER — SODIUM CHLORIDE 0.9 % IV SOLN
200.0000 mg | Freq: Once | INTRAVENOUS | Status: AC
  Administered 2022-06-04: 200 mg via INTRAVENOUS
  Filled 2022-06-04: qty 10

## 2022-06-04 NOTE — Patient Instructions (Signed)

## 2022-06-04 NOTE — Progress Notes (Signed)
Hematology/Oncology Progress note [phone] [phone]       Clinic Day:  06/04/2022  ASSESSMENT & PLAN:   Anemia due to stage 3b chronic kidney disease (HCC) Labs reviewed and discussed with patient.  Hemoglobin remains low despite Venofer and EPO.  Proceed with Retacrit 47340 units today Ferritin decreased, <200,  recommend IV Venofer weekly x 4 to further increase iron stores Monitor labs every 2 weeks +/- Retacrit if hemoglobin is less than 10. ? Suspect GI bleeding. Follow-up with gastroenterology for workup- she plans to call Dr. Annabell Sabal office  CKD (chronic kidney disease), stage III (HCC) Encourage oral hydration and avoid nephrotoxins.    Rheumatoid arthritis (HCC) #Rheumatoid arthritis off methotrexate.     Orders Placed This Encounter  Procedures   CBC with Differential (Cancer Center Only)    Standing Status:   Future    Standing Expiration Date:   06/04/2023   Iron and TIBC    Standing Status:   Future    Standing Expiration Date:   06/04/2023   Ferritin    Standing Status:   Future    Standing Expiration Date:   06/04/2023   Hemoglobin and Hematocrit, Blood    Standing Status:   Future    Standing Expiration Date:   06/04/2023   Hemoglobin and Hematocrit, Blood    Standing Status:   Future    Standing Expiration Date:   06/04/2023   Hemoglobin and Hematocrit, Blood    Standing Status:   Future    Standing Expiration Date:   06/04/2023   Hemoglobin and Hematocrit, Blood    Standing Status:   Future    Standing Expiration Date:   06/04/2023   Hemoglobin and Hematocrit, Blood    Standing Status:   Future    Standing Expiration Date:   06/04/2023    All questions were answered. The patient knows to call the clinic with any problems, questions or concerns.  Rickard Patience, MD, PhD [facility] Oncology 06/04/2022    Chief Complaint: Michele Moore is a 72 y.o. female presents for anemia in stage IIIb chronic kidney disease    PERTINENT ONCOLOGY HISTORY Michele Moore is a 72 y.o.afemale who has above oncology history reviewed by me today presented for follow up visit for management of anemia in CKD  PERTINENT HEMATOLOGY HISTORY Patient previously followed up by Dr.Corcoran, patient switched care to me on 08/30/20 Extensive medical record review was performed by me  # anemia in stage IIIb chronic kidney disease.  Work-up on 04/23/2020 revealed a hematocrit of 35.0, hemoglobin 11.4, MCV 92.3, platelets 168,000, WBC 4,600 with an ANC of 3000. Ferritin was 107 with an iron saturation of 37% and a TIBC of 315. Sed rate was 54 and CRP was 0.9.  Vitamin B12 was 429 and folate >100.0.   10/22/2017 Colonoscopy for heme positive stool revealed one 7 mm polyp in the ascending colon, one 3 mm polyp in the transverse colon, and three 1 to 4 mm polyps in the sigmoid colon. There were non-bleeding internal hemorrhoids. There were petechia(e) in the rectum. Pathology showed two tubular adenomas and one hyperplastic polyp. EGD on 10/22/2017 showed gastritis and one gastric polyp.  She has Sjogren's syndrome, rheumatoid arthritis, hypertension, and proteinuria.  She takes oral iron once a day.  Patient follows up with pain clinic for pain management.  She previously tolerated IV Venofer treatments.  INTERVAL HISTORY Michele Moore is a 72 y.o. female who has above history reviewed  by me today presents for follow up visit for anemia secondary to chronic kidney disease. Fatigue has improved.  Patient has received Venofer treatments and Epo treatments.no new complaints.    Past Medical History:  Diagnosis Date   (HFpEF) heart failure with preserved ejection fraction (HCC) 01/13/2014   a.) TTE 01/13/2014: EF 45%, apical HK, mod LVH, mild MAC, mild LAE/RVE, mod MR/TR/PR; b.) TTE 10/01/2018: EF 55-60%, mild MVH, mild LAE, triv MR/TR, G1DD   Allergy    ANA positive    Anemia of chronic renal failure, stage 3 (moderate), unspecified  whether stage 3a or 3b CKD (HCC)    Aortic atherosclerosis (HCC)    Asthma    Cataract    Chronic pain syndrome    a.) followed by [facility] Cherylann Ratel, MD)   Chronic, continuous use of opioids    CKD (chronic kidney disease), stage III (HCC)    Complication of anesthesia    a.) anesthesia awareness/premature emergence during colonoscopy   DDD (degenerative disc disease), lumbar    Dyspnea on exertion    Dysrhythmia    IRREGULAR HEART BEAT   Family history of adverse reaction to anesthesia    sister difficult to put to sleep   GERD (gastroesophageal reflux disease)    Glaucoma    Gout    H/O tooth extraction    all lower teeth 1/19   Hemorrhoid    History of hiatal hernia    History of kidney stones    Hypertension    Hypothyroidism    Implantable loop recorder present 08/22/2021   a.) s/p implantation of [employer] LINQ Reveal ILR; serial #: [id number]   Long term current use of immunosuppressive drug    a.) MTX + apremilast   Migraines    Mixed hyperlipidemia    Multiple gastric ulcers    Osteoarthritis    Psoriasis    Psoriatic arthritis (HCC)    a.) on apremilast   Rheumatoid arthritis (HCC)    a.) on MTX   SS-A antibody positive    Wears dentures    partial upper and lower    Past Surgical History:  Procedure Laterality Date   ANKLE SURGERY Right    CARPAL TUNNEL RELEASE     x3   COLONOSCOPY  2000?   COLONOSCOPY WITH PROPOFOL N/A 10/22/2017   Procedure: COLONOSCOPY WITH PROPOFOL;  Surgeon: Midge Minium, MD;  Location: [facility];  Service: Endoscopy;  Laterality: N/A;   ESOPHAGOGASTRODUODENOSCOPY (EGD) WITH PROPOFOL N/A 10/22/2017   Procedure: ESOPHAGOGASTRODUODENOSCOPY (EGD) WITH PROPOFOL;  Surgeon: Midge Minium, MD;  Location: [facility];  Service: Endoscopy;  Laterality: N/A;   FUSION OF TALONAVICULAR JOINT Right 08/03/2015   Procedure: TAILOR NAVICULAR JOINT FUSION - RIGHT ;  Surgeon: Gwyneth Revels, DPM;  Location: [facility];   Service: Podiatry;  Laterality: Right;   JOINT REPLACEMENT Right    knee x 3 ,right x1 and left x2   LOOP RECORDER IMPLANT  08/22/2021   Procedure: LOOP RECORDER IMPLANT; Location: [facility]; Surgeon: Sherryl Manges, MD   POLYPECTOMY N/A 10/22/2017   Procedure: POLYPECTOMY;  Surgeon: Midge Minium, MD;  Location: [facility];  Service: Endoscopy;  Laterality: N/A;   ROTATOR CUFF REPAIR Right 1995   TONSILLECTOMY     TOTAL KNEE REVISION Right 01/20/2018   Procedure: POLYETHYLENE EXCHANGE;  Surgeon: Donato Heinz, MD;  Location: [facility];  Service: Orthopedics;  Laterality: Right;   TOTAL KNEE REVISION Left 10/09/2021   Procedure: LEFT TOTAL KNEE REVISION  WITH POLYETHYLENE EXCHANGE.;  Surgeon: Donato HeinzHooten, James P, MD;  Location: [facility];  Service: Orthopedics;  Laterality: Left;   TUBAL LIGATION     UPPER GI ENDOSCOPY  2000?    Family History  Problem Relation Age of Onset   Heart failure Mother    Gout Mother    Arthritis Mother    Hypertension Mother    Stroke Mother    Heart failure Father    Diabetes Father    Hyperlipidemia Father    COPD Sister    Cancer Maternal Aunt        breast   Cancer Maternal Uncle        kidney   Breast cancer Neg Hx     Social History:  reports that she quit smoking about 29 years ago. Her smoking use included cigarettes. She has a 40.00 pack-year smoking history. She has never used smokeless tobacco. She reports that she does not drink alcohol and does not use drugs.  Allergies:  Allergies  Allergen Reactions   Ampicillin Swelling   Penicillins Anaphylaxis and Swelling    IgE = 10 (11/05/2017)  Has patient had a PCN reaction causing immediate rash, facial/tongue/throat swelling, SOB or lightheadedness with hypotension: Yes Has patient had a PCN reaction causing severe rash involving mucus membranes or skin necrosis: No Has patient had a PCN reaction that required hospitalization: No Has patient had a PCN reaction occurring within the last 10  years: Yes If all of the above answers are "NO", then may proceed with Cephalosporin use.   Vancomycin Rash    Other reaction(s): Other (see comments)   Vibramycin [Doxycycline Calcium] Rash    Current Medications: Current Outpatient Medications  Medication Sig Dispense Refill   acetaminophen (TYLENOL) 650 MG CR tablet Take 650 mg by mouth every 8 (eight) hours as needed for pain.     albuterol (VENTOLIN HFA) 108 (90 Base) MCG/ACT inhaler Inhale 2 puffs into the lungs every 6 (six) hours as needed for wheezing or shortness of breath. 8 g 2   allopurinol (ZYLOPRIM) 100 MG tablet Take 200 mg by mouth every morning.      betamethasone dipropionate 0.05 % cream APPLY TO PSORIASIS ON HANDS TWICE DAILY ONLY. AVOID FACE, GROIN AND AXILLA 45 g 0   calcipotriene (DOVONOX) 0.005 % cream APPLY TOPICALLY TO PSORIASIS AREAS ON LEGS AND FEET ONCE OR TWICE DAILY 540 g 1   Cholecalciferol (VITAMIN D3) 2000 units TABS Take 2,000 Units by mouth daily.     clobetasol ointment (TEMOVATE) 0.05 % Apply topically 2 (two) times daily.     diclofenac sodium (VOLTAREN) 1 % GEL Apply 2 g topically 3 (three) times daily.      FARXIGA 10 MG TABS tablet Take 10 mg by mouth every morning.     furosemide (LASIX) 40 MG tablet Take 40 mg by mouth daily.     gabapentin (NEURONTIN) 600 MG tablet Take 1 tablet (600 mg total) by mouth at bedtime. 30 tablet 5   [START ON 06/22/2022] HYDROcodone-acetaminophen (NORCO) 7.5-325 MG tablet Take 1 tablet by mouth every 8 (eight) hours as needed for severe pain. Must last 30 days. 90 tablet 0   HYDROcodone-acetaminophen (NORCO) 7.5-325 MG tablet Take 1 tablet by mouth every 8 (eight) hours as needed for severe pain. Must last 30 days. 90 tablet 0   ketoconazole (NIZORAL) 2 % cream Apply topically daily as needed. 15 g 2   levothyroxine (SYNTHROID) 150 MCG tablet Take 1 tablet (150 mcg  total) by mouth daily. 90 tablet 1   losartan (COZAAR) 25 MG tablet Take 25 mg by mouth daily.      meclizine (ANTIVERT) 12.5 MG tablet TAKE 1 TABLET(12.5 MG) BY MOUTH THREE TIMES DAILY AS NEEDED FOR DIZZINESS 30 tablet 1   metoprolol tartrate (LOPRESSOR) 25 MG tablet Take 1 tablet (25 mg total) by mouth 2 (two) times daily. 180 tablet 2   montelukast (SINGULAIR) 10 MG tablet TAKE 1 TABLET(10 MG) BY MOUTH AT BEDTIME 90 tablet 1   Multiple Vitamins-Calcium (ONE-A-DAY WOMENS FORMULA PO) Take 1 tablet by mouth daily.     mupirocin ointment (BACTROBAN) 2 % Apply 1 application topically 2 (two) times daily. 22 g 0   nystatin cream (MYCOSTATIN) Apply 1 Application topically 2 (two) times daily. 30 g 0   omega-3 acid ethyl esters (LOVAZA) 1 g capsule Take 1 capsule (1 g total) by mouth 2 (two) times daily. 180 capsule 1   omeprazole (PRILOSEC) 40 MG capsule TAKE 1 CAPSULE(40 MG) BY MOUTH DAILY 90 capsule 1   OTEZLA 30 MG TABS Take 1 tablet by mouth daily.     Polyethyl Glycol-Propyl Glycol (SYSTANE OP) Place 1 drop into both eyes daily as needed (dry eyes).     potassium chloride SA (KLOR-CON M) 20 MEQ tablet Take 1 tablet (20 mEq total) by mouth daily. 90 tablet 1   tiZANidine (ZANAFLEX) 4 MG tablet Take 1 tablet (4 mg total) by mouth at bedtime. 60 tablet 5   triamcinolone (KENALOG) 0.1 % Apply 1 application topically 2 (two) times daily. 30 g 0   No current facility-administered medications for this visit.    Review of Systems  Constitutional:  Negative for chills, diaphoresis, fever, malaise/fatigue and weight loss.  HENT:  Negative for congestion, ear discharge, ear pain, hearing loss, nosebleeds, sinus pain, sore throat and tinnitus.   Eyes:        Glaucoma  Respiratory:  Negative for cough, hemoptysis, sputum production and shortness of breath (on exertion, due to asthma).   Cardiovascular:  Positive for leg swelling (right ankle). Negative for chest pain and palpitations.  Gastrointestinal:  Negative for abdominal pain, blood in stool, constipation, diarrhea, heartburn, melena, nausea and  vomiting.  Genitourinary:  Negative for dysuria, frequency, hematuria and urgency.  Musculoskeletal:  Positive for joint pain (arthritis). Negative for back pain, myalgias and neck pain.       Status post knee replacement  Skin:  Negative for itching and rash.  Neurological:  Positive for sensory change (foot numbness s/p surgery). Negative for dizziness, tingling, weakness and headaches.  Endo/Heme/Allergies:  Does not bruise/bleed easily.  Psychiatric/Behavioral:  Negative for depression and memory loss. The patient is not nervous/anxious and does not have insomnia.   All other systems reviewed and are negative.   Performance status (ECOG): 1-2  Vitals Blood pressure (!) 108/57, pulse 70, temperature (!) 96.8 F (36 C), height 5' 7.5" (1.715 m), weight 244 lb 14.4 oz (111.1 kg), SpO2 98 %.   Physical Exam Vitals and nursing note reviewed.  Constitutional:      General: She is not in acute distress.    Appearance: She is not diaphoretic.  Eyes:     General: No scleral icterus.    Conjunctiva/sclera: Conjunctivae normal.  Neurological:     Mental Status: She is alert and oriented to person, place, and time.  Psychiatric:        Behavior: Behavior normal.        Thought Content: Thought  content normal.        Judgment: Judgment normal.    Labs    Latest Ref Rng & Units 05/30/2022    2:04 PM 05/21/2022    2:51 PM 05/12/2022    2:21 PM  CBC  WBC 4.0 - 10.5 K/uL 5.2   6.3   Hemoglobin 12.0 - 15.0 g/dL 8.1  8.6  7.7   Hematocrit 36.0 - 46.0 % 26.9  28.0  25.7   Platelets 150 - 400 K/uL 202   230       Latest Ref Rng & Units 01/08/2022    3:31 PM 12/18/2021    7:48 PM 11/20/2021    1:23 PM  CMP  Glucose 70 - 99 mg/dL 161  096  045   BUN 8 - 23 mg/dL 35  31  36   Creatinine 0.44 - 1.00 mg/dL 4.09  8.11  9.14   Sodium 135 - 145 mmol/Michele 138  138  138   Potassium 3.5 - 5.1 mmol/Michele 3.9  4.0  3.7   Chloride 98 - 111 mmol/Michele 102  105  102   CO2 22 - 32 mmol/Michele Calcium  8.9 - 10.3 mg/dL 9.8  78.2  95.6   Total Protein 6.5 - 8.1 g/dL 7.4   6.8   Total Bilirubin 0.3 - 1.2 mg/dL 0.4   0.4   Alkaline Phos 38 - 126 U/Michele 53   67   AST 15 - 41 U/Michele 21   22   ALT 0 - 44 U/Michele 10   11

## 2022-06-04 NOTE — Assessment & Plan Note (Addendum)
Labs reviewed and discussed with patient.  Hemoglobin remains low despite Venofer and EPO.  Proceed with Retacrit 25956 units today Ferritin decreased, <200,  recommend IV Venofer weekly x 4 to further increase iron stores Monitor labs every 2 weeks +/- Retacrit if hemoglobin is less than 10. ? Suspect GI bleeding. Follow-up with gastroenterology for workup- she plans to call Dr. Annabell Sabal office

## 2022-06-04 NOTE — Assessment & Plan Note (Signed)
#  Rheumatoid arthritis off methotrexate.

## 2022-06-04 NOTE — Assessment & Plan Note (Signed)
Encourage oral hydration and avoid nephrotoxins.   

## 2022-06-06 ENCOUNTER — Inpatient Hospital Stay: Payer: Medicare Other

## 2022-06-06 VITALS — BP 116/64 | HR 60

## 2022-06-06 DIAGNOSIS — N183 Chronic kidney disease, stage 3 unspecified: Secondary | ICD-10-CM

## 2022-06-06 DIAGNOSIS — M3500 Sicca syndrome, unspecified: Secondary | ICD-10-CM | POA: Diagnosis not present

## 2022-06-06 DIAGNOSIS — I130 Hypertensive heart and chronic kidney disease with heart failure and stage 1 through stage 4 chronic kidney disease, or unspecified chronic kidney disease: Secondary | ICD-10-CM | POA: Diagnosis not present

## 2022-06-06 DIAGNOSIS — D631 Anemia in chronic kidney disease: Secondary | ICD-10-CM | POA: Diagnosis not present

## 2022-06-06 DIAGNOSIS — D649 Anemia, unspecified: Secondary | ICD-10-CM

## 2022-06-06 DIAGNOSIS — Z79899 Other long term (current) drug therapy: Secondary | ICD-10-CM | POA: Diagnosis not present

## 2022-06-06 DIAGNOSIS — N1832 Chronic kidney disease, stage 3b: Secondary | ICD-10-CM | POA: Diagnosis not present

## 2022-06-06 DIAGNOSIS — M069 Rheumatoid arthritis, unspecified: Secondary | ICD-10-CM | POA: Diagnosis not present

## 2022-06-06 MED ORDER — EPOETIN ALFA-EPBX 40000 UNIT/ML IJ SOLN
40000.0000 [IU] | Freq: Once | INTRAMUSCULAR | Status: AC
  Administered 2022-06-06: 40000 [IU] via SUBCUTANEOUS
  Filled 2022-06-06: qty 1

## 2022-06-09 ENCOUNTER — Ambulatory Visit
Payer: Medicare Other | Attending: Student in an Organized Health Care Education/Training Program | Admitting: Student in an Organized Health Care Education/Training Program

## 2022-06-09 DIAGNOSIS — G894 Chronic pain syndrome: Secondary | ICD-10-CM

## 2022-06-09 DIAGNOSIS — Z96652 Presence of left artificial knee joint: Secondary | ICD-10-CM | POA: Diagnosis not present

## 2022-06-09 DIAGNOSIS — M25562 Pain in left knee: Secondary | ICD-10-CM

## 2022-06-09 DIAGNOSIS — G8929 Other chronic pain: Secondary | ICD-10-CM

## 2022-06-09 NOTE — Progress Notes (Signed)
Patient: Michele Moore  Service Category: E/M  Provider: Edward Jolly, MD  DOB: 28-Nov-1950  DOS: 06/09/2022  Location: Office  MRN: [id number]  Setting: Ambulatory outpatient  Referring Provider: Maurice Small, MD  Type: Established Patient  Specialty: Interventional Pain Management  PCP: Maurice Small, MD  Location: Remote location  Delivery: TeleHealth     Virtual Encounter - Pain Management PROVIDER NOTE: Information contained herein reflects review and annotations entered in association with encounter. Interpretation of such information and data should be left to medically-trained personnel. Information provided to patient can be located elsewhere in the medical record under "Patient Instructions". Document created using STT-dictation technology, any transcriptional errors that may result from process are unintentional.    Contact & Pharmacy Preferred: [phone] Home: There is no home phone number on file. Mobile: [phone] (mobile) E-mail: No e-mail address on record  [id number], [place] - [address] AT [facility] OF [address] [place] [address] Phone: [phone] Fax: [phone]  [facility] [address] [place] - [address] [place] [address] Phone: [phone] Fax: [phone]   Pre-screening  Michele Moore offered "in-person" vs "virtual" encounter. She indicated preferring virtual for this encounter.   Reason COVID-19*  Social distancing based on CDC and AMA recommendations.   I contacted Michele Moore on 06/09/2022 via telephone.      I clearly identified myself as Edward Jolly, MD. I verified that I was speaking with the correct person using two identifiers (Name: Michele Moore, and date of birth: 20-May-1950).  Consent I sought verbal advanced consent from Michele Moore for virtual visit interactions. I informed Michele Moore of possible security and privacy concerns, risks, and limitations  associated with providing "not-in-person" medical evaluation and management services. I also informed Michele Moore of the availability of "in-person" appointments. Finally, I informed her that there would be a charge for the virtual visit and that she could be  personally, fully or partially, financially responsible for it. Michele Moore expressed understanding and agreed to proceed.   Historic Elements   Michele Moore is a 72 y.o. year old, female patient evaluated today after our last contact on 05/27/2022. Michele Moore  has a past medical history of (HFpEF) heart failure with preserved ejection fraction (HCC) (01/13/2014), Allergy, ANA positive, Anemia of chronic renal failure, stage 3 (moderate), unspecified whether stage 3a or 3b CKD (HCC), Aortic atherosclerosis (HCC), Asthma, Cataract, Chronic pain syndrome, Chronic, continuous use of opioids, CKD (chronic kidney disease), stage III (HCC), Complication of anesthesia, DDD (degenerative disc disease), lumbar, Dyspnea on exertion, Dysrhythmia, Family history of adverse reaction to anesthesia, GERD (gastroesophageal reflux disease), Glaucoma, Gout, H/O tooth extraction, Hemorrhoid, History of hiatal hernia, History of kidney stones, Hypertension, Hypothyroidism, Implantable loop recorder present (08/22/2021), Long term current use of immunosuppressive drug, Migraines, Mixed hyperlipidemia, Multiple gastric ulcers, Osteoarthritis, Psoriasis, Psoriatic arthritis (HCC), Rheumatoid arthritis (HCC), SS-A antibody positive, and Wears dentures. She also  has a past surgical history that includes Carpal tunnel release; Rotator cuff repair (Right, 1995); Ankle surgery (Right); Tubal ligation; Colonoscopy (2000?); Upper gi endoscopy (2000?); Tonsillectomy; Fusion of talonavicular joint (Right, 08/03/2015); Colonoscopy with propofol (N/A, 10/22/2017); Esophagogastroduodenoscopy (egd) with propofol (N/A, 10/22/2017); polypectomy (N/A, 10/22/2017); Total knee revision (Right,  01/20/2018); Joint replacement (Right); Loop recorder implant (08/22/2021); and Total knee revision (Left, 10/09/2021). Michele Moore has a current medication list which includes the following prescription(s):  acetaminophen, albuterol, allopurinol, betamethasone dipropionate, calcipotriene, vitamin d3, clobetasol ointment, diclofenac sodium, farxiga, furosemide, gabapentin, [START ON 06/22/2022] hydrocodone-acetaminophen, hydrocodone-acetaminophen, ketoconazole, levothyroxine, losartan, meclizine, metoprolol tartrate, montelukast, multiple vitamins-calcium, mupirocin ointment, nystatin cream, omega-3 acid ethyl esters, omeprazole, otezla, polyethyl glycol-propyl glycol, potassium chloride sa, tizanidine, triamcinolone cream, [DISCONTINUED] fluticasone, and [DISCONTINUED] mometasone. She  reports that she quit smoking about 29 years ago. Her smoking use included cigarettes. She has a 40.00 pack-year smoking history. She has never used smokeless tobacco. She reports that she does not drink alcohol and does not use drugs. Michele Moore is allergic to ampicillin, penicillins, vancomycin, and vibramycin [doxycycline calcium].  BMI: Estimated body mass index is 37.79 kg/m as calculated from the following:   Height as of 06/04/22: 5' 7.5" (1.715 m).   Weight as of 06/04/22: 244 lb 14.4 oz (111.1 kg). Last encounter: 04/15/2022. Last procedure: 05/05/2022.  HPI  Today, she is being contacted for a post-procedure assessment.   Post-procedure evaluation    Procedure:          Anesthesia, Analgesia, Anxiolysis:  Type: Therapeutic Superolateral, Superomedial, and Inferomedial, Genicular Nerve Radiofrequency Ablation (destruction).   #2  Region: Lateral, Anterior, and Medial aspects of the knee joint, above and below the knee joint proper. Level: Superior and inferior to the knee joint. Laterality: Left  Anesthesia: Local (1-2% Lidocaine)  Guidance: Fluoroscopy           Position: Supine   Indications: 1. Chronic knee  pain after total replacement of left knee joint   2. Lumbar radiculopathy   3. Chronic pain syndrome    Michele Moore has been dealing with the above chronic pain for longer than three months and has either failed to respond, was unable to tolerate, or simply did not get enough benefit from other more conservative therapies including, but not limited to: 1. Over-the-counter medications 2. Anti-inflammatory medications 3. Muscle relaxants 4. Membrane stabilizers 5. Opioids 6. Physical therapy and/or chiropractic manipulation 7. Modalities (Heat, ice, etc.) 8. Invasive techniques such as nerve blocks. Michele Moore has attained more than 50% relief of the pain from a series of diagnostic injections conducted in separate occasions.  Pain Score: Pre-procedure: 8 /10 Post-procedure: 3 /10     Effectiveness:  Initial hour after procedure: 100 %  Subsequent 4-6 hours post-procedure: 100 %  Analgesia past initial 6 hours: 80 % (lasting a couple of weeks)  Ongoing improvement:  Analgesic:  50% Function: Somewhat improved ROM: Somewhat improved   Pharmacotherapy Assessment   Opioid Analgesic: Norco 7.5 mg TID PRN #90/month, 22.5    Monitoring: [place] PMP: PDMP reviewed during this encounter.       Pharmacotherapy: No side-effects or adverse reactions reported. Compliance: No problems identified. Effectiveness: Clinically acceptable. Plan: Refer to "POC". UDS:  Summary  Date Value Ref Range Status  08/06/2021 Note  Final    Comment:    ==================================================================== [employer]ox[employer] Select 13 (MW) ==================================================================== Test                             Result       Flag       Units  Drug Present and Declared for Prescription Verification   Hydrocodone                    1383         EXPECTED   ng/mg creat   Hydromorphone  260          EXPECTED   ng/mg creat   Norhydrocodone                 557           EXPECTED   ng/mg creat    Sources of hydrocodone include scheduled prescription medications.    Hydromorphone and norhydrocodone are expected metabolites of    hydrocodone. Hydromorphone is also available as a scheduled    prescription medication.  ==================================================================== Test                      Result    Flag   Units      Ref Range   Creatinine              47               mg/dL      >=69 ==================================================================== Declared Medications:  The flagging and interpretation on this report are based on the  following declared medications.  Unexpected results may arise from  inaccuracies in the declared medications.   **Note: The testing scope of this panel includes these medications:   Hydrocodone (Norco)   **Note: The testing scope of this panel does not include the  following reported medications:   Acetaminophen (Norco)  Albuterol (Ventolin HFA)  Allopurinol (Zyloprim)  Apremilast  Betamethasone  Calcipotriene  Clobetasol (Temovate)  Dapagliflozin (Farxiga)  Fish Oil  Fluticasone (Flonase)  Furosemide (Lasix)  Gabapentin (Neurontin)  Hydralazine (Apresoline)  Hydrochlorothiazide (Maxzide)  Iron  Ketoconazole (Nizoral)  Levothyroxine (Synthroid)  Losartan (Cozaar)  Meclizine (Antivert)  Methotrexate  Mometasone (Nasonex)  Montelukast (Singulair)  Multivitamin  Mupirocin (Bactroban)  Omeprazole (Prilosec)  Polyethylene Glycol  Potassium (Klor-Con)  Tizanidine (Zanaflex)  Topical  Topical Diclofenac (Voltaren)  Triamcinolone (Kenalog)  Triamterene (Maxzide)  Vitamin D3 ==================================================================== For clinical consultation, please call [phone]. ====================================================================    No results found for: "CBDTHCR", "D8THCCBX", "D9THCCBX"   Laboratory Chemistry Profile   Renal Lab  Results  Component Value Date   BUN 35 (H) 01/08/2022   CREATININE 2.07 (H) 01/08/2022   BCR 15 09/16/2021   GFRAA 30 (L) 08/28/2019   GFRNONAA 25 (L) 01/08/2022    Hepatic Lab Results  Component Value Date   AST 21 01/08/2022   ALT 10 01/08/2022   ALBUMIN 3.5 01/08/2022   ALKPHOS 53 01/08/2022    Electrolytes Lab Results  Component Value Date   NA 138 01/08/2022   K 3.9 01/08/2022   CL 102 01/08/2022   CALCIUM 9.8 01/08/2022   MG 2.0 10/01/2018   PHOS 3.0 09/16/2021    Bone No results found for: "VD25OH", "VD125OH2TOT", "PZ0258NI7", "PO2423NT6", "25OHVITD1", "25OHVITD2", "25OHVITD3", "TESTOFREE", "TESTOSTERONE"  Inflammation (CRP: Acute Phase) (ESR: Chronic Phase) Lab Results  Component Value Date   CRP 1.6 (H) 09/27/2021   ESRSEDRATE 79 (H) 09/27/2021         Note: Above Lab results reviewed.  Imaging  CUP PACEART REMOTE DEVICE CHECK ILR summary report received. Battery status OK. Normal device function. No new symptom, tachy, brady, or pause episodes. No new AF episodes. Monthly summary reports and ROV/PRN 2 false AF episodes, EGM's show ST with ectopy, 8-10sec in duration, pt has been dx with SCAF, no OAC  1 false pause, undersensing LA, CVRS  Assessment  The primary encounter diagnosis was Chronic knee pain after total replacement of left knee joint. A diagnosis of Chronic pain syndrome was also pertinent  to this visit.  Plan of Care  Good relief from left GN RFA, notes improvement in ability to bear weight and ambulate. Keep sch appt   Follow-up plan:   Return for Keep sch. appt.      Recent Visits Date Type Provider Dept  05/05/22 Procedure visit Edward Jolly, MD [facility]  04/15/22 Office Visit Edward Jolly, MD [facility]  Showing recent visits within past 90 days and meeting all other requirements Today's Visits Date Type Provider Dept  06/09/22 Office Visit Edward Jolly, MD [facility]  Showing today's  visits and meeting all other requirements Future Appointments Date Type Provider Dept  07/17/22 Appointment Edward Jolly, MD [facility]  Showing future appointments within next 90 days and meeting all other requirements  I discussed the assessment and treatment plan with the patient. The patient was provided an opportunity to ask questions and all were answered. The patient agreed with the plan and demonstrated an understanding of the instructions.  Patient advised to call back or seek an in-person evaluation if the symptoms or condition worsens.  Duration of encounter: .  Note by: Edward Jolly, MD Date: 06/09/2022; Time: 11:31 AM

## 2022-06-10 MED FILL — Iron Sucrose Inj 20 MG/ML (Fe Equiv): INTRAVENOUS | Qty: 10 | Status: AC

## 2022-06-11 ENCOUNTER — Inpatient Hospital Stay: Payer: Medicare Other

## 2022-06-11 VITALS — BP 105/42 | HR 56 | Temp 98.1°F | Resp 118

## 2022-06-11 DIAGNOSIS — M069 Rheumatoid arthritis, unspecified: Secondary | ICD-10-CM | POA: Diagnosis not present

## 2022-06-11 DIAGNOSIS — D649 Anemia, unspecified: Secondary | ICD-10-CM

## 2022-06-11 DIAGNOSIS — N1832 Chronic kidney disease, stage 3b: Secondary | ICD-10-CM | POA: Diagnosis not present

## 2022-06-11 DIAGNOSIS — Z79899 Other long term (current) drug therapy: Secondary | ICD-10-CM | POA: Diagnosis not present

## 2022-06-11 DIAGNOSIS — D631 Anemia in chronic kidney disease: Secondary | ICD-10-CM | POA: Diagnosis not present

## 2022-06-11 DIAGNOSIS — M3500 Sicca syndrome, unspecified: Secondary | ICD-10-CM | POA: Diagnosis not present

## 2022-06-11 DIAGNOSIS — N183 Chronic kidney disease, stage 3 unspecified: Secondary | ICD-10-CM

## 2022-06-11 DIAGNOSIS — I130 Hypertensive heart and chronic kidney disease with heart failure and stage 1 through stage 4 chronic kidney disease, or unspecified chronic kidney disease: Secondary | ICD-10-CM | POA: Diagnosis not present

## 2022-06-11 MED ORDER — SODIUM CHLORIDE 0.9 % IV SOLN
INTRAVENOUS | Status: DC
  Filled 2022-06-11: qty 250

## 2022-06-11 MED ORDER — SODIUM CHLORIDE 0.9 % IV SOLN
200.0000 mg | Freq: Once | INTRAVENOUS | Status: AC
  Administered 2022-06-11: 200 mg via INTRAVENOUS
  Filled 2022-06-11: qty 200

## 2022-06-11 NOTE — Patient Instructions (Signed)

## 2022-06-13 ENCOUNTER — Other Ambulatory Visit: Payer: Self-pay | Admitting: Family Medicine

## 2022-06-13 MED FILL — Iron Sucrose Inj 20 MG/ML (Fe Equiv): INTRAVENOUS | Qty: 10 | Status: AC

## 2022-06-13 NOTE — Telephone Encounter (Signed)
Requested medication (s) are due for refill today:  historical medication  Requested medication (s) are on the active medication list: yes   Last refill:  12/18/21  Future visit scheduled: yes in 3 months   Notes to clinic:  historical medication. Do you want to order Rx?     Requested Prescriptions  Pending Prescriptions Disp Refills   losartan (COZAAR) 25 MG tablet [Pharmacy Med Name: LOSARTAN  TABLETS] 90 tablet     Sig: TAKE 1 TABLET(25 MG) BY MOUTH EVERY MORNING     Cardiovascular:  Angiotensin Receptor Blockers Failed - 06/13/2022  2:58 PM      Failed - Cr in normal range and within 180 days    Creatinine  Date Value Ref Range Status  12/27/2013 1.63 (H) 0.60 - 1.30 mg/dL Final   Creatinine, Ser  Date Value Ref Range Status  01/08/2022 2.07 (H) 0.44 - 1.00 mg/dL Final         Failed - Last BP in normal range    BP Readings from Last 1 Encounters:  06/11/22 (!) 105/42         Passed - K in normal range and within 180 days    Potassium  Date Value Ref Range Status  01/08/2022 3.9 3.5 - 5.1 mmol/L Final  12/27/2013 3.8 3.5 - 5.1 mmol/L Final         Passed - Patient is not pregnant      Passed - Valid encounter within last 6 months    Recent Outpatient Visits           2 months ago Hypothyroidism, unspecified type    [facility]   5 months ago Hypotension due to hypovolemia   [facility]   6 months ago Hypothyroidism, unspecified type   [facility]   9 months ago Hypertension, unspecified type   [facility] at [facility]   1 year ago Hypertension, unspecified type   [facility] at [facility], [facility]       Future Appointments              In 3 months Duanne Limerick, MD [facility] at [facility], [facility]

## 2022-06-16 ENCOUNTER — Inpatient Hospital Stay: Payer: Medicare Other

## 2022-06-16 VITALS — BP 105/54 | HR 59

## 2022-06-16 DIAGNOSIS — D631 Anemia in chronic kidney disease: Secondary | ICD-10-CM | POA: Diagnosis not present

## 2022-06-16 DIAGNOSIS — I130 Hypertensive heart and chronic kidney disease with heart failure and stage 1 through stage 4 chronic kidney disease, or unspecified chronic kidney disease: Secondary | ICD-10-CM | POA: Diagnosis not present

## 2022-06-16 DIAGNOSIS — M069 Rheumatoid arthritis, unspecified: Secondary | ICD-10-CM | POA: Diagnosis not present

## 2022-06-16 DIAGNOSIS — M3500 Sicca syndrome, unspecified: Secondary | ICD-10-CM | POA: Diagnosis not present

## 2022-06-16 DIAGNOSIS — Z79899 Other long term (current) drug therapy: Secondary | ICD-10-CM | POA: Diagnosis not present

## 2022-06-16 DIAGNOSIS — N183 Chronic kidney disease, stage 3 unspecified: Secondary | ICD-10-CM

## 2022-06-16 DIAGNOSIS — D649 Anemia, unspecified: Secondary | ICD-10-CM

## 2022-06-16 DIAGNOSIS — N1832 Chronic kidney disease, stage 3b: Secondary | ICD-10-CM | POA: Diagnosis not present

## 2022-06-16 LAB — HEMOGLOBIN AND HEMATOCRIT, BLOOD
HCT: 25.1 % — ABNORMAL LOW (ref 36.0–46.0)
Hemoglobin: 7.6 g/dL — ABNORMAL LOW (ref 12.0–15.0)

## 2022-06-16 MED ORDER — EPOETIN ALFA-EPBX 40000 UNIT/ML IJ SOLN
40000.0000 [IU] | Freq: Once | INTRAMUSCULAR | Status: AC
  Administered 2022-06-16: 40000 [IU] via SUBCUTANEOUS
  Filled 2022-06-16: qty 1

## 2022-06-17 ENCOUNTER — Other Ambulatory Visit: Payer: Self-pay | Admitting: Family Medicine

## 2022-06-17 ENCOUNTER — Telehealth: Payer: Self-pay | Admitting: Internal Medicine

## 2022-06-17 ENCOUNTER — Telehealth: Payer: Self-pay | Admitting: *Deleted

## 2022-06-17 ENCOUNTER — Other Ambulatory Visit: Payer: Self-pay

## 2022-06-17 ENCOUNTER — Telehealth: Payer: Self-pay

## 2022-06-17 DIAGNOSIS — D649 Anemia, unspecified: Secondary | ICD-10-CM

## 2022-06-17 DIAGNOSIS — E782 Mixed hyperlipidemia: Secondary | ICD-10-CM

## 2022-06-17 MED ORDER — OMEGA-3-ACID ETHYL ESTERS 1 G PO CAPS
1.0000 | ORAL_CAPSULE | Freq: Two times a day (BID) | ORAL | 1 refills | Status: DC
Start: 2022-06-17 — End: 2022-09-25

## 2022-06-17 MED FILL — Iron Sucrose Inj 20 MG/ML (Fe Equiv): INTRAVENOUS | Qty: 10 | Status: AC

## 2022-06-17 NOTE — Telephone Encounter (Signed)
   Pre-operative Risk Assessment    Patient Name: Michele Moore  DOB: [birth year] MRN: [id number]      Request for Surgical Clearance    Procedure:   COLONOSCOPY/EGD  Date of Surgery:  Clearance TBD                               Surgeon:  NOT INDICATED Surgeon's Group or Practice Name:  [facility] Phone number:  [phone] Fax number:  [phone] Type of Clearance Requested:   - Medical    Type of Anesthesia:  General    Additional requests/questions:    SignedDalia Heading   06/17/2022, 11:21 AM

## 2022-06-17 NOTE — Telephone Encounter (Signed)
Can you please schedule patient for lab work day 1 and blood transfusion 1 unit day 2 this week and notify patient of appointment date and time. I will notify patient that she needs this.

## 2022-06-17 NOTE — Telephone Encounter (Signed)
Medication Refill - Medication: omega-3 acid ethyl esters (LOVAZA) 1 g capsule  Has the patient contacted their pharmacy? yes (Agent: If no, request that the patient contact the pharmacy for the refill. If patient does not wish to contact the pharmacy document the reason why and proceed with request.) (Agent: If yes, when and what did the pharmacy advise?)contact pcp  Preferred Pharmacy (with phone number or street name): [id number] - [place], [place] - [address] AT [address] Phone:  [phone]  Fax:  [phone]     Has the patient been seen for an appointment in the last year OR does the patient have an upcoming appointment? yes  Agent: Please be advised that RX refills may take up to 3 business days. We ask that you follow-up with your pharmacy.  

## 2022-06-17 NOTE — Telephone Encounter (Signed)
Requested Prescriptions  Pending Prescriptions Disp Refills   omega-3 acid ethyl esters (LOVAZA) 1 g capsule 180 capsule 1    Sig: Take 1 capsule (1 g total) by mouth 2 (two) times daily.     Endocrinology:  Nutritional Agents - omega-3 acid ethyl esters Failed - 06/17/2022  1:43 PM      Failed - Lipid Panel in normal range within the last 12 months    Cholesterol, Total  Date Value Ref Range Status  03/19/2021 198 100 - 199 mg/dL Final   LDL Chol Calc (NIH)  Date Value Ref Range Status  03/19/2021 121 (H) 0 - 99 mg/dL Final   HDL  Date Value Ref Range Status  03/19/2021 49 >39 mg/dL Final   Triglycerides  Date Value Ref Range Status  03/19/2021 159 (H) 0 - 149 mg/dL Final         Passed - Valid encounter within last 12 months    Recent Outpatient Visits           2 months ago Hypothyroidism, unspecified type    [facility]   5 months ago Hypotension due to hypovolemia   [facility]   6 months ago Hypothyroidism, unspecified type   [facility] at [facility] MD   9 months ago Hypertension, unspecified type   [facility] at [facility] MD   1 year ago Hypertension, unspecified type   [facility] at [facility], [facility]       Future Appointments             In 3 months Duanne Limerick, MD [facility] Primary Care & Sports Medicine at [facility], [facility]

## 2022-06-17 NOTE — Telephone Encounter (Signed)
Please keep venofer as scheduled, please schedule lab same day (prior to getting infusion). Blood D2

## 2022-06-17 NOTE — Telephone Encounter (Signed)
Pt has been scheduled for tele pre op appt 06/24/22 @ 10 am. Med rec and consent are done.        Patient Consent for Virtual Visit        Michele Moore has provided verbal consent on 06/17/2022 for a virtual visit (video or telephone).   CONSENT FOR VIRTUAL VISIT FOR:  Michele Moore  By participating in this virtual visit I agree to the following:  I hereby voluntarily request, consent and authorize [facility] and its employed or contracted physicians, physician assistants, nurse practitioners or other licensed health care professionals (the Practitioner), to provide me with telemedicine health care services (the "Services") as deemed necessary by the treating Practitioner. I acknowledge and consent to receive the Services by the Practitioner via telemedicine. I understand that the telemedicine visit will involve communicating with the Practitioner through live audiovisual communication technology and the disclosure of certain medical information by electronic transmission. I acknowledge that I have been given the opportunity to request an in-person assessment or other available alternative prior to the telemedicine visit and am voluntarily participating in the telemedicine visit.  I understand that I have the right to withhold or withdraw my consent to the use of telemedicine in the course of my care at any time, without affecting my right to future care or treatment, and that the Practitioner or I may terminate the telemedicine visit at any time. I understand that I have the right to inspect all information obtained and/or recorded in the course of the telemedicine visit and may receive copies of available information for a reasonable fee.  I understand that some of the potential risks of receiving the Services via telemedicine include:  Delay or interruption in medical evaluation due to technological equipment failure or disruption; Information transmitted may not be sufficient (e.g.  poor resolution of images) to allow for appropriate medical decision making by the Practitioner; and/or  In rare instances, security protocols could fail, causing a breach of personal health information.  Furthermore, I acknowledge that it is my responsibility to provide information about my medical history, conditions and care that is complete and accurate to the best of my ability. I acknowledge that Practitioner's advice, recommendations, and/or decision may be based on factors not within their control, such as incomplete or inaccurate data provided by me or distortions of diagnostic images or specimens that may result from electronic transmissions. I understand that the practice of medicine is not an exact science and that Practitioner makes no warranties or guarantees regarding treatment outcomes. I acknowledge that a copy of this consent can be made available to me via my patient portal [facility] MyChart), or I can request a printed copy by calling the office of [facility].    I understand that my insurance will be billed for this visit.   I have read or had this consent read to me. I understand the contents of this consent, which adequately explains the benefits and risks of the Services being provided via telemedicine.  I have been provided ample opportunity to ask questions regarding this consent and the Services and have had my questions answered to my satisfaction. I give my informed consent for the services to be provided through the use of telemedicine in my medical care

## 2022-06-17 NOTE — Telephone Encounter (Signed)
Primary Cardiologist:None   Preoperative team, please contact this patient and set up a phone call appointment for further preoperative risk assessment. Please obtain consent and complete medication review. Thank you for your help.   I confirm that guidance regarding antiplatelet and oral anticoagulation therapy has been completed and, if necessary, noted below (none requested).   Levi Aland, NP-C  06/17/2022, 11:49 AM [address], [address] Office [phone] Fax [phone]

## 2022-06-17 NOTE — Telephone Encounter (Signed)
Pt has been scheduled for tele pre op appt 06/24/22 @ 10 am. Med rec and consent are done.        Patient Consent for Virtual Visit        Michele Moore has provided verbal consent on 06/17/2022 for a virtual visit (video or telephone).   CONSENT FOR VIRTUAL VISIT FOR:  Michele Moore  By participating in this virtual visit I agree to the following:  I hereby voluntarily request, consent and authorize [facility] and its employed or contracted physicians, physician assistants, nurse practitioners or other licensed health care professionals (the Practitioner), to provide me with telemedicine health care services (the "Services") as deemed necessary by the treating Practitioner. I acknowledge and consent to receive the Services by the Practitioner via telemedicine. I understand that the telemedicine visit will involve communicating with the Practitioner through live audiovisual communication technology and the disclosure of certain medical information by electronic transmission. I acknowledge that I have been given the opportunity to request an in-person assessment or other available alternative prior to the telemedicine visit and am voluntarily participating in the telemedicine visit.  I understand that I have the right to withhold or withdraw my consent to the use of telemedicine in the course of my care at any time, without affecting my right to future care or treatment, and that the Practitioner or I may terminate the telemedicine visit at any time. I understand that I have the right to inspect all information obtained and/or recorded in the course of the telemedicine visit and may receive copies of available information for a reasonable fee.  I understand that some of the potential risks of receiving the Services via telemedicine include:  Delay or interruption in medical evaluation due to technological equipment failure or disruption; Information transmitted may not be sufficient (e.g.  poor resolution of images) to allow for appropriate medical decision making by the Practitioner; and/or  In rare instances, security protocols could fail, causing a breach of personal health information.  Furthermore, I acknowledge that it is my responsibility to provide information about my medical history, conditions and care that is complete and accurate to the best of my ability. I acknowledge that Practitioner's advice, recommendations, and/or decision may be based on factors not within their control, such as incomplete or inaccurate data provided by me or distortions of diagnostic images or specimens that may result from electronic transmissions. I understand that the practice of medicine is not an exact science and that Practitioner makes no warranties or guarantees regarding treatment outcomes. I acknowledge that a copy of this consent can be made available to me via my patient portal ([facility] My[facility]), or I can request a printed copy by calling the office of [facility].    I understand that my insurance will be billed for this visit.   I have read or had this consent read to me. I understand the contents of this consent, which adequately explains the benefits and risks of the Services being provided via telemedicine.  I have been provided ample opportunity to ask questions regarding this consent and the Services and have had my questions answered to my satisfaction. I give my informed consent for the services to be provided through the use of telemedicine in my medical care    

## 2022-06-17 NOTE — Telephone Encounter (Signed)
I s/w the pt to set up tele preop appt. Pt states she did not know anything about needing a colonoscopy and EGD. She said the doctor never said anything to her. I advised pt let me send the GI office a note to call her and discuss with her further. She asked me who the doctor was. I answered not sure as it was not given to [place] though we will need to know who the doctor is so we can notes. I stated once she s/w the GI office then can let us know of the date of procedure and then we can schedule tele appt. Pt said thank you for my call and my help.

## 2022-06-17 NOTE — Telephone Encounter (Signed)
-----   Message from Rickard Patience, MD sent at 06/16/2022 10:33 PM EDT ----- Hb continues to drop, please arrange her to get type and screen, transfuse 1 unit of PBBC. This week. Thanks.   zy

## 2022-06-18 ENCOUNTER — Telehealth: Payer: Self-pay

## 2022-06-18 ENCOUNTER — Other Ambulatory Visit: Payer: Self-pay

## 2022-06-18 ENCOUNTER — Inpatient Hospital Stay: Payer: Medicare Other

## 2022-06-18 ENCOUNTER — Other Ambulatory Visit: Payer: Self-pay | Admitting: Oncology

## 2022-06-18 VITALS — BP 119/56 | HR 62 | Temp 97.5°F | Resp 16

## 2022-06-18 DIAGNOSIS — M3500 Sicca syndrome, unspecified: Secondary | ICD-10-CM | POA: Diagnosis not present

## 2022-06-18 DIAGNOSIS — D649 Anemia, unspecified: Secondary | ICD-10-CM

## 2022-06-18 DIAGNOSIS — I130 Hypertensive heart and chronic kidney disease with heart failure and stage 1 through stage 4 chronic kidney disease, or unspecified chronic kidney disease: Secondary | ICD-10-CM | POA: Diagnosis not present

## 2022-06-18 DIAGNOSIS — D631 Anemia in chronic kidney disease: Secondary | ICD-10-CM | POA: Diagnosis not present

## 2022-06-18 DIAGNOSIS — M069 Rheumatoid arthritis, unspecified: Secondary | ICD-10-CM | POA: Diagnosis not present

## 2022-06-18 DIAGNOSIS — N183 Chronic kidney disease, stage 3 unspecified: Secondary | ICD-10-CM

## 2022-06-18 DIAGNOSIS — N1832 Chronic kidney disease, stage 3b: Secondary | ICD-10-CM | POA: Diagnosis not present

## 2022-06-18 DIAGNOSIS — Z79899 Other long term (current) drug therapy: Secondary | ICD-10-CM | POA: Diagnosis not present

## 2022-06-18 LAB — TYPE AND SCREEN
Antibody Screen: NEGATIVE
Unit division: 0

## 2022-06-18 LAB — PREPARE RBC (CROSSMATCH)

## 2022-06-18 MED ORDER — SODIUM CHLORIDE 0.9 % IV SOLN
200.0000 mg | Freq: Once | INTRAVENOUS | Status: AC
  Administered 2022-06-18: 200 mg via INTRAVENOUS
  Filled 2022-06-18: qty 200

## 2022-06-18 MED ORDER — SODIUM CHLORIDE 0.9 % IV SOLN
Freq: Once | INTRAVENOUS | Status: AC
  Filled 2022-06-18: qty 250

## 2022-06-18 NOTE — Telephone Encounter (Signed)
she can just be set up for the procedures.

## 2022-06-19 ENCOUNTER — Inpatient Hospital Stay: Payer: Medicare Other

## 2022-06-19 DIAGNOSIS — M069 Rheumatoid arthritis, unspecified: Secondary | ICD-10-CM | POA: Diagnosis not present

## 2022-06-19 DIAGNOSIS — D631 Anemia in chronic kidney disease: Secondary | ICD-10-CM | POA: Diagnosis not present

## 2022-06-19 DIAGNOSIS — D649 Anemia, unspecified: Secondary | ICD-10-CM

## 2022-06-19 DIAGNOSIS — N1832 Chronic kidney disease, stage 3b: Secondary | ICD-10-CM | POA: Diagnosis not present

## 2022-06-19 DIAGNOSIS — M3500 Sicca syndrome, unspecified: Secondary | ICD-10-CM | POA: Diagnosis not present

## 2022-06-19 DIAGNOSIS — Z79899 Other long term (current) drug therapy: Secondary | ICD-10-CM | POA: Diagnosis not present

## 2022-06-19 DIAGNOSIS — I130 Hypertensive heart and chronic kidney disease with heart failure and stage 1 through stage 4 chronic kidney disease, or unspecified chronic kidney disease: Secondary | ICD-10-CM | POA: Diagnosis not present

## 2022-06-19 LAB — TYPE AND SCREEN

## 2022-06-19 MED ORDER — SODIUM CHLORIDE 0.9% IV SOLUTION
250.0000 mL | Freq: Once | INTRAVENOUS | Status: AC
  Administered 2022-06-19: 250 mL via INTRAVENOUS
  Filled 2022-06-19: qty 250

## 2022-06-19 MED ORDER — DIPHENHYDRAMINE HCL 25 MG PO CAPS
25.0000 mg | ORAL_CAPSULE | Freq: Once | ORAL | Status: AC
  Administered 2022-06-19: 25 mg via ORAL
  Filled 2022-06-19: qty 1

## 2022-06-19 MED ORDER — ACETAMINOPHEN 325 MG PO TABS
650.0000 mg | ORAL_TABLET | Freq: Once | ORAL | Status: AC
  Administered 2022-06-19: 650 mg via ORAL
  Filled 2022-06-19: qty 2

## 2022-06-19 NOTE — Patient Instructions (Signed)

## 2022-06-20 ENCOUNTER — Ambulatory Visit: Payer: Medicare Other

## 2022-06-20 LAB — TYPE AND SCREEN: ABO/RH(D): O POS

## 2022-06-20 LAB — BPAM RBC
Blood Product Expiration Date: 202405272359
ISSUE DATE / TIME: 202404251111
Unit Type and Rh: 5100

## 2022-06-22 NOTE — Progress Notes (Unsigned)
Virtual Visit via Telephone Note   Because of Michele Moore's co-morbid illnesses, she is at least at moderate risk for complications without adequate follow up.  This format is felt to be most appropriate for this patient at this time.  The patient did not have access to video technology/had technical difficulties with video requiring transitioning to audio format only (telephone).  All issues noted in this document were discussed and addressed.  No physical exam could be performed with this format.  Please refer to the patient's chart for her consent to telehealth for [facility].  Evaluation Performed:  Preoperative cardiovascular risk assessment _____________   Date:  06/22/2022   Patient ID:  Michele Moore, DOB 1950/03/05, MRN [id number] Patient Location:  Home Provider location:   Office  Primary Care Provider:  Duanne Limerick, MD Primary Cardiologist:  None  Chief Complaint / Patient Profile   72 y.o. y/o female with a h/o  palpitations with concern for atrial fibrillation s/p placement of loop recorder in 07/2021, HFpEF, hypertension, hyperlipidemia, CKD stage III, GERD, anemia, rheumatic arthritis, psoriasis, and chronic pain  who is pending colonoscopy/EGD and presents today for telephonic preoperative cardiovascular risk assessment.  History of Present Illness    Michele Moore is a 72 y.o. female who presents via audio/video conferencing for a telehealth visit today.  Pt was last seen in cardiology clinic on 08/22/2021 by Dr. Graciela Husbands.  At that time SHAWNTA SCHLEGEL was doing well with no new cardiac complaints..  The patient is now pending procedure as outlined above. Since her last visit, she is doing well with no new cardiac complaints.  She denies chest pain, shortness of breath, lower extremity edema, fatigue, palpitations, melena, hematuria, hemoptysis, diaphoresis, weakness, presyncope, syncope, orthopnea, and PND.   None requested  Past Medical History    Past  Medical History:  Diagnosis Date   (HFpEF) heart failure with preserved ejection fraction (HCC) 01/13/2014   a.) TTE 01/13/2014: EF 45%, apical HK, mod LVH, mild MAC, mild LAE/RVE, mod MR/TR/PR; b.) TTE 10/01/2018: EF 55-60%, mild MVH, mild LAE, triv MR/TR, G1DD   Allergy    ANA positive    Anemia of chronic renal failure, stage 3 (moderate), unspecified whether stage 3a or 3b CKD (HCC)    Aortic atherosclerosis (HCC)    Asthma    Cataract    Chronic pain syndrome    a.) followed by [facility] Cherylann Ratel, MD)   Chronic, continuous use of opioids    CKD (chronic kidney disease), stage III (HCC)    Complication of anesthesia    a.) anesthesia awareness/premature emergence during colonoscopy   DDD (degenerative disc disease), lumbar    Dyspnea on exertion    Dysrhythmia    IRREGULAR HEART BEAT   Family history of adverse reaction to anesthesia    sister difficult to put to sleep   GERD (gastroesophageal reflux disease)    Glaucoma    Gout    H/O tooth extraction    all lower teeth 1/19   Hemorrhoid    History of hiatal hernia    History of kidney stones    Hypertension    Hypothyroidism    Implantable loop recorder present 08/22/2021   a.) s/p implantation of [employer] LINQ Reveal ILR; serial #: [id number]   Long term current use of immunosuppressive drug    a.) MTX + apremilast   Migraines    Mixed hyperlipidemia    Multiple gastric ulcers  Osteoarthritis    Psoriasis    Psoriatic arthritis (HCC)    a.) on apremilast   Rheumatoid arthritis (HCC)    a.) on MTX   SS-A antibody positive    Wears dentures    partial upper and lower   Past Surgical History:  Procedure Laterality Date   ANKLE SURGERY Right    CARPAL TUNNEL RELEASE     x3   COLONOSCOPY  2000?   COLONOSCOPY WITH PROPOFOL N/A 10/22/2017   Procedure: COLONOSCOPY WITH PROPOFOL;  Surgeon: Midge Minium, MD;  Location: [facility];  Service: Endoscopy;  Laterality: N/A;    ESOPHAGOGASTRODUODENOSCOPY (EGD) WITH PROPOFOL N/A 10/22/2017   Procedure: ESOPHAGOGASTRODUODENOSCOPY (EGD) WITH PROPOFOL;  Surgeon: Midge Minium, MD;  Location: [facility];  Service: Endoscopy;  Laterality: N/A;   FUSION OF TALONAVICULAR JOINT Right 08/03/2015   Procedure: TAILOR NAVICULAR JOINT FUSION - RIGHT ;  Surgeon: Gwyneth Revels, DPM;  Location: [facility];  Service: Podiatry;  Laterality: Right;   JOINT REPLACEMENT Right    knee x 3 ,right x1 and left x2   LOOP RECORDER IMPLANT  08/22/2021   Procedure: LOOP RECORDER IMPLANT; Location: [facility]; Surgeon: Sherryl Manges, MD   POLYPECTOMY N/A 10/22/2017   Procedure: POLYPECTOMY;  Surgeon: Midge Minium, MD;  [facility];  Service: Endoscopy;  Laterality: N/A;   ROTATOR CUFF REPAIR Right 1995   TONSILLECTOMY     TOTAL KNEE REVISION Right 01/20/2018   Procedure: POLYETHYLENE EXCHANGE;  Surgeon: Donato Heinz, MD;  Location: [facility];  Service: Orthopedics;  Laterality: Right;   TOTAL KNEE REVISION Left 10/09/2021   Procedure: LEFT TOTAL KNEE REVISION WITH POLYETHYLENE EXCHANGE.;  Surgeon: Donato Heinz, MD;  Location: [facility];  Service: Orthopedics;  Laterality: Left;   TUBAL LIGATION     UPPER GI ENDOSCOPY  2000?    Allergies  Allergies  Allergen Reactions   Ampicillin Swelling   Penicillins Anaphylaxis and Swelling    IgE = 10 (11/05/2017)  Has patient had a PCN reaction causing immediate rash, facial/tongue/throat swelling, SOB or lightheadedness with hypotension: Yes Has patient had a PCN reaction causing severe rash involving mucus membranes or skin necrosis: No Has patient had a PCN reaction that required hospitalization: No Has patient had a PCN reaction occurring within the last 10 years: Yes If all of the above answers are "NO", then may proceed with Cephalosporin use.   Vancomycin Rash    Other reaction(s): Other (see comments)   Vibramycin [Doxycycline Calcium] Rash    Home Medications     Prior to Admission medications   Medication Sig Start Date End Date Taking? Authorizing Provider  acetaminophen (TYLENOL) 650 MG CR tablet Take 650 mg by mouth every 8 (eight) hours as needed for pain.    [provider]  albuterol (VENTOLIN HFA) 108 (90 Base) MCG/ACT inhaler Inhale 2 puffs into the lungs every 6 (six) hours as needed for wheezing or shortness of breath. 06/04/21   Phineas Semen, MD  allopurinol (ZYLOPRIM) 100 MG tablet Take 200 mg by mouth every morning.  08/06/17   [provider]  betamethasone dipropionate 0.05 % cream APPLY TO PSORIASIS ON HANDS TWICE DAILY ONLY. AVOID FACE, GROIN AND AXILLA 05/30/19   Deirdre Evener, MD  calcipotriene (DOVONOX) 0.005 % cream APPLY TOPICALLY TO PSORIASIS AREAS ON LEGS AND FEET ONCE OR TWICE DAILY 03/28/21   Deirdre Evener, MD  Cholecalciferol (VITAMIN D3) 2000 units TABS Take 2,000 Units by mouth daily.  [provider]  clobetasol ointment (TEMOVATE) 0.05 % Apply topically 2 (two) times daily. 05/28/21   [provider]  diclofenac sodium (VOLTAREN) 1 % GEL Apply 2 g topically 3 (three) times daily.  08/11/17   [provider]  FARXIGA 10 MG TABS tablet Take 10 mg by mouth every morning. 07/15/21   [provider]  furosemide (LASIX) 40 MG tablet Take 40 mg by mouth daily. 07/15/21   [provider]  gabapentin (NEURONTIN) 600 MG tablet Take 1 tablet (600 mg total) by mouth at bedtime. 02/10/22 08/09/22  Edward Jolly, MD  HYDROcodone-acetaminophen (NORCO) 7.5-325 MG tablet Take 1 tablet by mouth every 8 (eight) hours as needed for severe pain. Must last 30 days. 06/22/22 07/22/22  Edward Jolly, MD  HYDROcodone-acetaminophen (NORCO) 7.5-325 MG tablet Take 1 tablet by mouth every 8 (eight) hours as needed for severe pain. Must last 30 days. 05/28/22 06/27/22  Edward Jolly, MD  JARDIANCE 10 MG TABS tablet Take by mouth. 04/29/22 04/29/23  [provider]  ketoconazole (NIZORAL) 2 %  cream Apply topically daily as needed. 03/19/21   Duanne Limerick, MD  levothyroxine (SYNTHROID) 150 MCG tablet Take 1 tablet (150 mcg total) by mouth daily. 03/27/22   Duanne Limerick, MD  losartan (COZAAR) 25 MG tablet Take 25 mg by mouth daily. 12/18/21   [provider]  meclizine (ANTIVERT) 12.5 MG tablet TAKE 1 TABLET(12.5 MG) BY MOUTH THREE TIMES DAILY AS NEEDED FOR DIZZINESS 09/16/21   Duanne Limerick, MD  metoprolol tartrate (LOPRESSOR) 25 MG tablet Take 1 tablet (25 mg total) by mouth 2 (two) times daily. 01/24/22   Duke Salvia, MD  montelukast (SINGULAIR) 10 MG tablet TAKE 1 TABLET(10 MG) BY MOUTH AT BEDTIME 03/27/22   Duanne Limerick, MD  Multiple Vitamins-Calcium (ONE-A-DAY WOMENS FORMULA PO) Take 1 tablet by mouth daily.    [provider]  mupirocin ointment (BACTROBAN) 2 % Apply 1 application topically 2 (two) times daily. 12/08/19   Duanne Limerick, MD  nystatin cream (MYCOSTATIN) Apply 1 Application topically 2 (two) times daily. 09/16/21   Duanne Limerick, MD  omega-3 acid ethyl esters (LOVAZA) 1 g capsule Take 1 capsule (1 g total) by mouth 2 (two) times daily. 06/17/22   Duanne Limerick, MD  omeprazole (PRILOSEC) 40 MG capsule TAKE 1 CAPSULE(40 MG) BY MOUTH DAILY 03/27/22   Duanne Limerick, MD  OTEZLA 30 MG TABS Take 1 tablet by mouth daily. 11/12/20   [provider]  Polyethyl Glycol-Propyl Glycol (SYSTANE OP) Place 1 drop into both eyes daily as needed (dry eyes).    [provider]  potassium chloride SA (KLOR-CON M) 20 MEQ tablet Take 1 tablet (20 mEq total) by mouth daily. 03/27/22   Duanne Limerick, MD  tiZANidine (ZANAFLEX) 4 MG tablet Take 1 tablet (4 mg total) by mouth at bedtime. 11/05/21 10/31/22  Edward Jolly, MD  triamcinolone (KENALOG) 0.1 % Apply 1 application topically 2 (two) times daily. 03/22/20   Duanne Limerick, MD  fluticasone (FLONASE) 50 MCG/ACT nasal spray SHAKE LIQUID AND USE 1 SPRAY IN [facility] NOSTRIL DAILY 02/28/19 04/24/19  Duanne Limerick, MD  mometasone (NASONEX) 50 MCG/ACT nasal spray Place 2 sprays into the nose daily. 04/12/19 04/24/19  Duanne Limerick, MD    Physical Exam    Vital Signs:  PHALA SCHRAEDER does not have vital signs available for review today.  Given telephonic nature of communication, physical exam  is limited. AAOx3. NAD. Normal affect.  Speech and respirations are unlabored.  Accessory Clinical Findings    None  Assessment & Plan    1.  Preoperative Cardiovascular Risk Assessment:  -Patient's RCRI score is 6.6% due to creatinine >2  The patient affirms she has been doing well without any new cardiac symptoms. They are able to achieve 4 METS without cardiac limitations. Therefore, based on ACC/AHA guidelines, the patient would be at acceptable risk for the planned procedure without further cardiovascular testing. The patient was advised that if she develops new symptoms prior to surgery to contact our office to arrange for a follow-up visit, and she verbalized understanding.    The patient was advised that if she develops new symptoms prior to surgery to contact our office to arrange for a follow-up visit, and she verbalized understanding.  Patient currently not on blood thinners  A copy of this note will be routed to requesting surgeon.  Time:   Today, I have spent 9 minutes with the patient with telehealth technology discussing medical history, symptoms, and management plan.     Napoleon Form, Leodis Rains, NP  06/22/2022, 7:02 PM

## 2022-06-23 ENCOUNTER — Other Ambulatory Visit: Payer: Self-pay | Admitting: Family Medicine

## 2022-06-23 DIAGNOSIS — K219 Gastro-esophageal reflux disease without esophagitis: Secondary | ICD-10-CM

## 2022-06-23 DIAGNOSIS — E782 Mixed hyperlipidemia: Secondary | ICD-10-CM

## 2022-06-23 NOTE — Telephone Encounter (Signed)
Medication Refill - Medication: omega-3 acid ethyl esters (LOVAZA) 1 g capsule     Has the patient contacted their pharmacy? Yes.     Preferred Pharmacy (with phone number or street name):  [id number] [place] - [address] AT [facility] OF [address]  [address] [place] [address]  Phone: [phone] Fax: [phone]  Hours: Not open 24 hours      Has the patient been seen for an appointment in the last year OR does the patient have an upcoming appointment? Yes.    Agent: Please be advised that RX refills may take up to 3 business days. We ask that you follow-up with your pharmacy.

## 2022-06-24 ENCOUNTER — Ambulatory Visit: Payer: Medicare Other | Attending: Cardiovascular Disease

## 2022-06-24 DIAGNOSIS — Z01810 Encounter for preprocedural cardiovascular examination: Secondary | ICD-10-CM | POA: Diagnosis not present

## 2022-06-24 NOTE — Telephone Encounter (Signed)
Unable to refill per protocol, Rx request was refilled 06/17/22, duplicate request.  Requested Prescriptions  Pending Prescriptions Disp Refills   omega-3 acid ethyl esters (LOVAZA) 1 g capsule 180 capsule 1    Sig: Take 1 capsule (1 g total) by mouth 2 (two) times daily.     Endocrinology:  Nutritional Agents - omega-3 acid ethyl esters Failed - 06/23/2022  5:20 PM      Failed - Lipid Panel in normal range within the last 12 months    Cholesterol, Total  Date Value Ref Range Status  03/19/2021 198 100 - 199 mg/dL Final   LDL Chol Calc (NIH)  Date Value Ref Range Status  03/19/2021 121 (H) 0 - 99 mg/dL Final   HDL  Date Value Ref Range Status  03/19/2021 49 >39 mg/dL Final   Triglycerides  Date Value Ref Range Status  03/19/2021 159 (H) 0 - 149 mg/dL Final         Passed - Valid encounter within last 12 months    Recent Outpatient Visits           2 months ago Hypothyroidism, unspecified type   [facility] Phineas Inches, MD   5 months ago Hypotension due to hypovolemia   [facility] Phineas Inches, MD   7 months ago Hypothyroidism, unspecified type   [facility]ine[facility] Inches, MD   9 months ago Hypertension, unspecified type   [facility] In[facility], MD   1 year ago Hypertension, unspecified type   [facility] Inches, MD       Future Appointments             In 3 months Duanne Limerick, MD [facility] at [facility]

## 2022-06-25 ENCOUNTER — Inpatient Hospital Stay: Payer: Medicare Other | Attending: Oncology

## 2022-06-25 ENCOUNTER — Telehealth: Payer: Self-pay | Admitting: Family Medicine

## 2022-06-25 VITALS — BP 106/53 | HR 51 | Temp 99.5°F | Resp 18

## 2022-06-25 DIAGNOSIS — N183 Chronic kidney disease, stage 3 unspecified: Secondary | ICD-10-CM | POA: Diagnosis not present

## 2022-06-25 DIAGNOSIS — I130 Hypertensive heart and chronic kidney disease with heart failure and stage 1 through stage 4 chronic kidney disease, or unspecified chronic kidney disease: Secondary | ICD-10-CM | POA: Insufficient documentation

## 2022-06-25 DIAGNOSIS — Z79899 Other long term (current) drug therapy: Secondary | ICD-10-CM | POA: Diagnosis not present

## 2022-06-25 DIAGNOSIS — D649 Anemia, unspecified: Secondary | ICD-10-CM

## 2022-06-25 DIAGNOSIS — D631 Anemia in chronic kidney disease: Secondary | ICD-10-CM | POA: Diagnosis not present

## 2022-06-25 MED ORDER — SODIUM CHLORIDE 0.9 % IV SOLN
INTRAVENOUS | Status: DC
  Filled 2022-06-25: qty 250

## 2022-06-25 MED ORDER — SODIUM CHLORIDE 0.9 % IV SOLN
200.0000 mg | Freq: Once | INTRAVENOUS | Status: AC
  Administered 2022-06-25: 200 mg via INTRAVENOUS
  Filled 2022-06-25: qty 200

## 2022-06-25 NOTE — Telephone Encounter (Signed)
The patient called in because she doesn't understand why all of a sudden the insurance won't pay or requires a prior authorization for her Omega 3 pills? She states there were no problems last time in February but all of a sudden there is a problem and she doesn't understand. Please assist patient further as she has been talking with [facility] about this and she needs it for her heart.

## 2022-06-26 ENCOUNTER — Telehealth: Payer: Self-pay

## 2022-06-26 NOTE — Telephone Encounter (Signed)
Called patient as well as sent chat message to Dr Berton Mount at heart clinic. Pt will need to have Dr Graciela Husbands prescribe her Lovaza in the future due to ins.

## 2022-06-27 ENCOUNTER — Ambulatory Visit: Payer: Self-pay | Admitting: *Deleted

## 2022-06-27 ENCOUNTER — Encounter: Payer: Self-pay | Admitting: Internal Medicine

## 2022-06-27 ENCOUNTER — Telehealth: Payer: Self-pay

## 2022-06-27 MED FILL — Iron Sucrose Inj 20 MG/ML (Fe Equiv): INTRAVENOUS | Qty: 10 | Status: AC

## 2022-06-27 NOTE — Telephone Encounter (Signed)
Spoke with pt who states she was informed by her PCP office Lovaza will now need to be prescribed by her cardiologist due to medicare guidelines.  Pt advised Dr Graciela Husbands is out of the office this week but will speak with him next week when he returns regarding medication refill.  Pt verbalizes understanding and thanked Charity fundraiser for the call.

## 2022-06-27 NOTE — Telephone Encounter (Signed)
Phone call to Dr Yetta Barre office to request recent Lipid panel results as last lipid in EPIC is from 2021.  Provided phone number and fax number to receptionist.

## 2022-06-27 NOTE — Telephone Encounter (Signed)
Left message- I called the pharm and couldn't get anyone to pick up. I need to know the company in which I need to call about lovaza/ also left message for cardio nurse

## 2022-06-27 NOTE — Telephone Encounter (Signed)
  Chief Complaint: request copy of recent lipid  Disposition: [] ED /[] Urgent Care (no appt availability in office) / [] Appointment(In office/virtual)/ []  [facility]/ [] Home Care/ [] Refused Recommended Disposition /[] [facility]/ [x]  Follow-up with PCP Additional Notes:   Mindi Junker calling from Dr. Odessa Fleming office would like to know is there a recent lipid panel for the patient. She does not see one in EPIC. Fax_ [phone]

## 2022-06-27 NOTE — Telephone Encounter (Signed)
Summary: Lipid Panel Advice   Mindi Junker calling from Dr. Odessa Fleming office would like to know is there a recent lipid panel for the patient. She does not see one in EPIC. Fax_ [phone]         Last lipid in lab list: 03/19/21 Reason for Disposition . [1] Caller requesting NON-URGENT health information AND [2] PCP's office is the best resource  Answer Assessment - Initial Assessment Questions 1. REASON FOR CALL or QUESTION: "What is your reason for calling today?" or "How can I best help you?" or "What question do you have that I can help answer?"     Request for most current lipid  Mindi Junker calling from Dr. Odessa Fleming office would like to know is there a recent lipid panel for the patient. She does not see one in EPIC.  Fax_ [phone]  Protocols used: Information Only Call - No Triage-A-AH

## 2022-06-27 NOTE — Telephone Encounter (Signed)
Pt is requesting Dr. Graciela Husbands to refill medication Omega 3 (lovaza) 1 g. I called pt's pharmacy to see why pt could not get medication and pt's PCP has been refilling this medication and pharmacy stated that pt needed a prior auth. Pt would like Dr. Odessa Fleming nurse to give her a call back. Please address

## 2022-06-27 NOTE — Telephone Encounter (Signed)
*  STAT* If patient is at the pharmacy, call can be transferred to refill team.   1. Which medications need to be refilled? (please list name of each medication and dose if known) New prescription for Omega 3 insurance will not pay for this unless Cardiogolist write this prescription  2. Which pharmacy/location (including street and city if local pharmacy) is medication to be sent to? [facility] [address], [place]  3. Do they need a 30 day or 90 day supply? 90 days and refills

## 2022-06-30 ENCOUNTER — Telehealth: Payer: Self-pay

## 2022-06-30 ENCOUNTER — Inpatient Hospital Stay: Payer: Medicare Other

## 2022-06-30 ENCOUNTER — Other Ambulatory Visit: Payer: Self-pay

## 2022-06-30 ENCOUNTER — Ambulatory Visit (INDEPENDENT_AMBULATORY_CARE_PROVIDER_SITE_OTHER): Payer: Medicare Other

## 2022-06-30 VITALS — BP 139/75 | HR 82

## 2022-06-30 DIAGNOSIS — D649 Anemia, unspecified: Secondary | ICD-10-CM

## 2022-06-30 DIAGNOSIS — N183 Chronic kidney disease, stage 3 unspecified: Secondary | ICD-10-CM | POA: Diagnosis not present

## 2022-06-30 DIAGNOSIS — R002 Palpitations: Secondary | ICD-10-CM | POA: Diagnosis not present

## 2022-06-30 DIAGNOSIS — R69 Illness, unspecified: Secondary | ICD-10-CM

## 2022-06-30 DIAGNOSIS — Z79899 Other long term (current) drug therapy: Secondary | ICD-10-CM | POA: Diagnosis not present

## 2022-06-30 DIAGNOSIS — E7801 Familial hypercholesterolemia: Secondary | ICD-10-CM

## 2022-06-30 DIAGNOSIS — D631 Anemia in chronic kidney disease: Secondary | ICD-10-CM | POA: Diagnosis not present

## 2022-06-30 DIAGNOSIS — N1832 Chronic kidney disease, stage 3b: Secondary | ICD-10-CM

## 2022-06-30 DIAGNOSIS — I130 Hypertensive heart and chronic kidney disease with heart failure and stage 1 through stage 4 chronic kidney disease, or unspecified chronic kidney disease: Secondary | ICD-10-CM | POA: Diagnosis not present

## 2022-06-30 LAB — CUP PACEART REMOTE DEVICE CHECK
Date Time Interrogation Session: 20240505230735
Implantable Pulse Generator Implant Date: 20230629

## 2022-06-30 LAB — HEMOGLOBIN AND HEMATOCRIT, BLOOD
HCT: 24.7 % — ABNORMAL LOW (ref 36.0–46.0)
Hemoglobin: 7.5 g/dL — ABNORMAL LOW (ref 12.0–15.0)

## 2022-06-30 MED ORDER — EPOETIN ALFA-EPBX 40000 UNIT/ML IJ SOLN
40000.0000 [IU] | Freq: Once | INTRAMUSCULAR | Status: AC
  Administered 2022-06-30: 40000 [IU] via SUBCUTANEOUS
  Filled 2022-06-30: qty 1

## 2022-06-30 NOTE — Progress Notes (Signed)
Labs printed.

## 2022-06-30 NOTE — Telephone Encounter (Signed)
Fax has been sent to Dr. Annabell Sabal office for pt to be contacted and set up for follow up appt.   Re: GI bleeing workup

## 2022-07-01 ENCOUNTER — Telehealth: Payer: Self-pay

## 2022-07-01 DIAGNOSIS — R69 Illness, unspecified: Secondary | ICD-10-CM | POA: Diagnosis not present

## 2022-07-01 DIAGNOSIS — E7801 Familial hypercholesterolemia: Secondary | ICD-10-CM | POA: Diagnosis not present

## 2022-07-01 NOTE — Telephone Encounter (Signed)
Received a fax from dr. Cathie Hoops office that patient needed a appointment with Dr. Servando Snare for possible GI bleed. Offered her a appointment today with our PA tina at 1:00 and she said she could not make that appointment. Made appointment for May 14 at 2:30pm

## 2022-07-01 NOTE — Telephone Encounter (Signed)
1.  Preoperative Cardiovascular Risk Assessment:   -Patient's RCRI score is 6.6% due to creatinine >2   The patient affirms she has been doing well without any new cardiac symptoms. They are able to achieve 4 METS without cardiac limitations. Therefore, based on ACC/AHA guidelines, the patient would be at acceptable risk for the planned procedure without further cardiovascular testing. The patient was advised that if she develops new symptoms prior to surgery to contact our office to arrange for a follow-up visit, and she verbalized understanding.      The patient was advised that if she develops new symptoms prior to surgery to contact our office to arrange for a follow-up visit, and she verbalized understanding.   Patient currently not on blood thinners   A copy of this note will be routed to requesting surgeon.     Napoleon Form, Leodis Rains, NP

## 2022-07-01 NOTE — Telephone Encounter (Signed)
Called pt and left message, called daughter Lambert Mody and spoke to her- told her to get message to pt to come by and get labs drawn per cardiology

## 2022-07-02 LAB — COMPREHENSIVE METABOLIC PANEL
ALT: 13 IU/L (ref 0–32)
AST: 21 IU/L (ref 0–40)
Albumin/Globulin Ratio: 1.7 (ref 1.2–2.2)
Albumin: 3.9 g/dL (ref 3.8–4.8)
Alkaline Phosphatase: 115 IU/L (ref 44–121)
BUN/Creatinine Ratio: 14 (ref 12–28)
BUN: 25 mg/dL (ref 8–27)
Bilirubin Total: <0.2 mg/dL (ref 0.0–1.2)
CO2: 23 mmol/L (ref 20–29)
Calcium: 10.4 mg/dL — ABNORMAL HIGH (ref 8.7–10.3)
Chloride: 104 mmol/L (ref 96–106)
Creatinine, Ser: 1.79 mg/dL — ABNORMAL HIGH (ref 0.57–1.00)
Globulin, Total: 2.3 g/dL (ref 1.5–4.5)
Glucose: 94 mg/dL (ref 70–99)
Potassium: 3.8 mmol/L (ref 3.5–5.2)
Sodium: 142 mmol/L (ref 134–144)
Total Protein: 6.2 g/dL (ref 6.0–8.5)
eGFR: 30 mL/min/{1.73_m2} — ABNORMAL LOW (ref >59)

## 2022-07-02 LAB — LIPID PANEL WITH LDL/HDL RATIO
Cholesterol, Total: 167 mg/dL (ref 100–199)
HDL: 47 mg/dL (ref >39)
LDL Chol Calc (NIH): 96 mg/dL (ref 0–99)
LDL/HDL Ratio: 2 ratio (ref 0.0–3.2)
Triglycerides: 135 mg/dL (ref 0–149)
VLDL Cholesterol Cal: 24 mg/dL (ref 5–40)

## 2022-07-03 NOTE — Telephone Encounter (Signed)
She has been on this for years--I dont Rx this myself-- either you can or we can have gen cards weigh in  Thanks SK

## 2022-07-03 NOTE — Progress Notes (Signed)
Carelink Summary Report / Loop Recorder 

## 2022-07-07 NOTE — Telephone Encounter (Signed)
Spoke with pt and advised per Dr Graciela Husbands and PCP it is recommended she see a general cardiology provider to follow her cholesterol and prescribe her Lovaza.  Pt verbalizes understanding and states she would like to see if she could coordinate appointment with her cancer center appointments to help save on gas.  Pt agreeable to have scheduler contact her for further assistance and thanked RN for the call.

## 2022-07-07 NOTE — Telephone Encounter (Signed)
This encounter was created in error - please disregard.

## 2022-07-08 ENCOUNTER — Other Ambulatory Visit: Payer: Self-pay

## 2022-07-08 ENCOUNTER — Ambulatory Visit (INDEPENDENT_AMBULATORY_CARE_PROVIDER_SITE_OTHER): Payer: Medicare Other | Admitting: Physician Assistant

## 2022-07-08 ENCOUNTER — Encounter: Payer: Self-pay | Admitting: Physician Assistant

## 2022-07-08 VITALS — BP 131/86 | HR 71 | Temp 98.6°F | Ht 70.0 in | Wt 245.0 lb

## 2022-07-08 DIAGNOSIS — Z8711 Personal history of peptic ulcer disease: Secondary | ICD-10-CM

## 2022-07-08 DIAGNOSIS — D509 Iron deficiency anemia, unspecified: Secondary | ICD-10-CM | POA: Diagnosis not present

## 2022-07-08 DIAGNOSIS — Z86010 Personal history of colonic polyps: Secondary | ICD-10-CM | POA: Diagnosis not present

## 2022-07-08 DIAGNOSIS — R1319 Other dysphagia: Secondary | ICD-10-CM

## 2022-07-08 DIAGNOSIS — R1310 Dysphagia, unspecified: Secondary | ICD-10-CM

## 2022-07-08 DIAGNOSIS — D638 Anemia in other chronic diseases classified elsewhere: Secondary | ICD-10-CM

## 2022-07-08 MED ORDER — PEG 3350-KCL-NA BICARB-NACL 420 G PO SOLR: 4000.0000 mL | Freq: Once | ORAL | 0 refills | Status: AC

## 2022-07-08 NOTE — Progress Notes (Signed)
Celso Amy, PA-C [address]  [address]  [place], [place] [address]  Main: [phone]  Fax: [phone]   Primary Care Physician: Duanne Limerick, MD  Primary Gastroenterologist:  Dr. Midge Minium   Chief Complaint  Patient presents with   New Patient (Initial Visit)    Possible GI bleed    HPI: Michele NIELSEN is a 72 y.o. female, established patient of Dr. Daleen Squibb, returns for follow-up of dysphagia, anemia, and history of colon polyps.  Last saw Dr. Servando Snare 01/2022.  Previous EGD and colonoscopy done in 2019, for iron deficiency anemia, showed adenomatous colon polyps and benign gastric polyp removed.  Biopsies negative for H. pylori.  Repeat colonoscopy in 5 years.  Has episodes of  dysphagia is mostly to pills and water.  She reports history of anemia since childhood.  Sees hematologist.  She received blood transfusion 06/23/2022.  She also see receives IV and oral iron.  Last lab 06/30/2022 showed hemoglobin 7.5, hematocrit 24.7.  Since February her hemoglobin has ranged between 6.3 and 8.6.  Her hematologist recommended that she have an updated EGD and colonoscopy to check for sources of anemia.  She recently received cardiac clearance to proceed with EGD and colonoscopy to further evaluate anemia, dysphagia, and history of colon polyps.  She denies any abdominal pain or rectal bleeding.  Stools are dark on iron.  She has occasional heartburn.  3 episodes of loose stools this morning.  Takes MiraLAX as needed for occasional constipation.  Medical history significant for rheumatoid arthritis, psoriatic arthritis, CHF with preserved EF 55 to 60%, anemia of chronic disease, chronic kidney disease, chronic pain, GERD, history of gastric ulcers, history of hiatal hernia, hypertension, implantable loop recorder 07/2021.  She does not take any aspirin, NSAIDs, or blood thinners.  Multiple orthopedic surgeries.  Last knee surgery was 09/2021.   Current Outpatient Medications   Medication Sig Dispense Refill   acetaminophen (TYLENOL) 650 MG CR tablet Take 650 mg by mouth every 8 (eight) hours as needed for pain.     albuterol (VENTOLIN HFA) 108 (90 Base) MCG/ACT inhaler Inhale 2 puffs into the lungs every 6 (six) hours as needed for wheezing or shortness of breath. 8 g 2   allopurinol (ZYLOPRIM) 100 MG tablet Take 200 mg by mouth every morning.      betamethasone dipropionate 0.05 % cream APPLY TO PSORIASIS ON HANDS TWICE DAILY ONLY. AVOID FACE, GROIN AND AXILLA 45 g 0   calcipotriene (DOVONOX) 0.005 % cream APPLY TOPICALLY TO PSORIASIS AREAS ON LEGS AND FEET ONCE OR TWICE DAILY 540 g 1   Cholecalciferol (VITAMIN D3) 2000 units TABS Take 2,000 Units by mouth daily.     clobetasol ointment (TEMOVATE) 0.05 % Apply topically 2 (two) times daily.     diclofenac sodium (VOLTAREN) 1 % GEL Apply 2 g topically 3 (three) times daily.      FARXIGA 10 MG TABS tablet Take 10 mg by mouth every morning.     furosemide (LASIX) 40 MG tablet Take 40 mg by mouth daily.     gabapentin (NEURONTIN) 600 MG tablet Take 1 tablet (600 mg total) by mouth at bedtime. 30 tablet 5   HYDROcodone-acetaminophen (NORCO) 7.5-325 MG tablet Take 1 tablet by mouth every 8 (eight) hours as needed for severe pain. Must last 30 days. 90 tablet 0   ketoconazole (NIZORAL) 2 % cream Apply topically daily as needed. 15 g 2   levothyroxine (SYNTHROID) 150 MCG tablet Take 1  tablet (150 mcg total) by mouth daily. 90 tablet 1   losartan (COZAAR) 25 MG tablet Take 25 mg by mouth daily.     meclizine (ANTIVERT) 12.5 MG tablet TAKE 1 TABLET(12.5 MG) BY MOUTH THREE TIMES DAILY AS NEEDED FOR DIZZINESS 30 tablet 1   metoprolol tartrate (LOPRESSOR) 25 MG tablet Take 1 tablet (25 mg total) by mouth 2 (two) times daily. 180 tablet 2   montelukast (SINGULAIR) 10 MG tablet TAKE 1 TABLET(10 MG) BY MOUTH AT BEDTIME 90 tablet 1   Multiple Vitamins-Calcium (ONE-A-DAY WOMENS FORMULA PO) Take 1 tablet by mouth daily.     mupirocin  ointment (BACTROBAN) 2 % Apply 1 application topically 2 (two) times daily. 22 g 0   nystatin cream (MYCOSTATIN) Apply 1 Application topically 2 (two) times daily. 30 g 0   omega-3 acid ethyl esters (LOVAZA) 1 g capsule Take 1 capsule (1 g total) by mouth 2 (two) times daily. 180 capsule 1   omeprazole (PRILOSEC) 40 MG capsule TAKE 1 CAPSULE(40 MG) BY MOUTH DAILY 90 capsule 1   OTEZLA 30 MG TABS Take 1 tablet by mouth daily.     Polyethyl Glycol-Propyl Glycol (SYSTANE OP) Place 1 drop into both eyes daily as needed (dry eyes).     potassium chloride SA (KLOR-CON M) 20 MEQ tablet Take 1 tablet (20 mEq total) by mouth daily. 90 tablet 1   tiZANidine (ZANAFLEX) 4 MG tablet Take 1 tablet (4 mg total) by mouth at bedtime. 60 tablet 5   triamcinolone (KENALOG) 0.1 % Apply 1 application topically 2 (two) times daily. 30 g 0   No current facility-administered medications for this visit.    Allergies as of 07/08/2022 - Review Complete 07/08/2022  Allergen Reaction Noted   Ampicillin Swelling 09/04/2016   Penicillins Anaphylaxis and Swelling 04/19/2014   Vancomycin Rash 03/06/2019   Vibramycin [doxycycline calcium] Rash 04/19/2014      ROS:  General: Negative for anorexia, weight loss, fever, chills, fatigue, weakness. ENT: Negative for hoarseness, difficulty swallowing , nasal congestion. CV: Negative for chest pain, angina, palpitations, dyspnea on exertion, peripheral edema.  Respiratory: Negative for dyspnea at rest, dyspnea on exertion, cough, sputum, wheezing.  GI: See history of present illness. GU:  Negative for dysuria, hematuria, urinary incontinence, urinary frequency, nocturnal urination.  Endo: Negative for unusual weight change.    Physical Examination:   There were no vitals taken for this visit.  General: Obese, febrile elderly female, in no acute distress.  Eyes: No icterus. Conjunctivae pink. Mouth: Oropharyngeal mucosa moist and pink , no lesions erythema or  exudate. Lungs: Clear to auscultation bilaterally. Non-labored. Heart: Regular rate and rhythm, no murmurs rubs or gallops.  Abdomen: Bowel sounds are normal, nontender, nondistended, no hepatosplenomegaly or masses, no abdominal bruits or hernia , no rebound or guarding.   Extremities: No lower extremity edema. No clubbing or deformities. Neuro: Alert and oriented x 3.  Grossly intact.  Walks with a walker. Skin: Warm and dry, no jaundice.   Psych: Alert and cooperative, normal mood and affect.   Imaging Studies: CUP PACEART REMOTE DEVICE CHECK  Result Date: 06/30/2022 ILR summary report received. Battery status OK. Normal device function. No new symptom, tachy, brady, or pause episodes. 1 new AF episode with EGM, in duration, burden 0.1%.  Hx of SCAF, no OAC.  Monthly summary reports and ROV/PRN LA, CVRS   Assessment and Plan:   Michele Moore is a 72 y.o. y/o female recurrent iron deficiency anemia and  spite of blood transfusions, IV iron, and oral iron.  Scheduling EGD and colonoscopy to evaluate sources of persistent anemia.  Last EGD and colon in 2019 showed a few adenomatous polyps and 1 benign gastric polyp removed.  Biopsies negative for H. pylori.  Patient is not on aspirin or blood thinners.  Iron deficiency anemia  Scheduling EGD and Colonoscopy I discussed risks of EGD and colonoscopy with patient to include risk of bleeding, colon perforation, and risk of sedation.   Patient expressed understanding and agrees to proceed with colonoscopy.  Patient has received cardiac clearance for procedures.  2.  History of Colon Polyps  3.  History of GERD, Hiatal Hernia and Gastric Ulcer  2.  Multiple CoMorbidities / Advanced Age:   And has received cardiac clearance to proceed with EGD and colonoscopy.  Schedule in hospital at [facility] with anesthesia support.  Celso Amy, PA-C  Follow up in 3 months with Dr. Servando Snare.

## 2022-07-14 ENCOUNTER — Inpatient Hospital Stay: Payer: Medicare Other

## 2022-07-14 ENCOUNTER — Other Ambulatory Visit: Payer: Self-pay | Admitting: Oncology

## 2022-07-14 ENCOUNTER — Other Ambulatory Visit: Payer: Self-pay

## 2022-07-14 VITALS — BP 109/61

## 2022-07-14 DIAGNOSIS — Z79899 Other long term (current) drug therapy: Secondary | ICD-10-CM | POA: Diagnosis not present

## 2022-07-14 DIAGNOSIS — D649 Anemia, unspecified: Secondary | ICD-10-CM

## 2022-07-14 DIAGNOSIS — I130 Hypertensive heart and chronic kidney disease with heart failure and stage 1 through stage 4 chronic kidney disease, or unspecified chronic kidney disease: Secondary | ICD-10-CM | POA: Diagnosis not present

## 2022-07-14 DIAGNOSIS — N183 Chronic kidney disease, stage 3 unspecified: Secondary | ICD-10-CM | POA: Diagnosis not present

## 2022-07-14 DIAGNOSIS — D631 Anemia in chronic kidney disease: Secondary | ICD-10-CM

## 2022-07-14 LAB — HEMOGLOBIN AND HEMATOCRIT, BLOOD
HCT: 23.2 % — ABNORMAL LOW (ref 36.0–46.0)
Hemoglobin: 7 g/dL — ABNORMAL LOW (ref 12.0–15.0)

## 2022-07-14 LAB — TYPE AND SCREEN

## 2022-07-14 LAB — PREPARE RBC (CROSSMATCH)

## 2022-07-14 MED ORDER — EPOETIN ALFA-EPBX 40000 UNIT/ML IJ SOLN
40000.0000 [IU] | Freq: Once | INTRAMUSCULAR | Status: AC
  Administered 2022-07-14: 40000 [IU] via SUBCUTANEOUS
  Filled 2022-07-14: qty 1

## 2022-07-15 ENCOUNTER — Ambulatory Visit: Payer: Medicare Other

## 2022-07-15 ENCOUNTER — Inpatient Hospital Stay: Payer: Medicare Other

## 2022-07-15 DIAGNOSIS — D631 Anemia in chronic kidney disease: Secondary | ICD-10-CM | POA: Diagnosis not present

## 2022-07-15 DIAGNOSIS — D649 Anemia, unspecified: Secondary | ICD-10-CM

## 2022-07-15 DIAGNOSIS — Z79899 Other long term (current) drug therapy: Secondary | ICD-10-CM | POA: Diagnosis not present

## 2022-07-15 DIAGNOSIS — I130 Hypertensive heart and chronic kidney disease with heart failure and stage 1 through stage 4 chronic kidney disease, or unspecified chronic kidney disease: Secondary | ICD-10-CM | POA: Diagnosis not present

## 2022-07-15 DIAGNOSIS — N183 Chronic kidney disease, stage 3 unspecified: Secondary | ICD-10-CM | POA: Diagnosis not present

## 2022-07-15 LAB — TYPE AND SCREEN
ABO/RH(D): O POS
Unit division: 0

## 2022-07-15 LAB — BPAM RBC: Blood Product Expiration Date: 202406252359

## 2022-07-15 MED ORDER — DIPHENHYDRAMINE HCL 25 MG PO CAPS
25.0000 mg | ORAL_CAPSULE | Freq: Once | ORAL | Status: AC
  Administered 2022-07-15: 25 mg via ORAL
  Filled 2022-07-15: qty 1

## 2022-07-15 MED ORDER — ACETAMINOPHEN 325 MG PO TABS
650.0000 mg | ORAL_TABLET | Freq: Once | ORAL | Status: AC
  Administered 2022-07-15: 650 mg via ORAL
  Filled 2022-07-15: qty 2

## 2022-07-15 MED ORDER — HEPARIN SOD (PORK) LOCK FLUSH 100 UNIT/ML IV SOLN
500.0000 [IU] | Freq: Every day | INTRAVENOUS | Status: DC | PRN
  Filled 2022-07-15: qty 5

## 2022-07-15 MED ORDER — SODIUM CHLORIDE 0.9% FLUSH
10.0000 mL | INTRAVENOUS | Status: DC | PRN
  Filled 2022-07-15: qty 10

## 2022-07-15 MED ORDER — SODIUM CHLORIDE 0.9% IV SOLUTION
250.0000 mL | Freq: Once | INTRAVENOUS | Status: AC
  Administered 2022-07-15: 250 mL via INTRAVENOUS
  Filled 2022-07-15: qty 250

## 2022-07-15 NOTE — Patient Instructions (Signed)
Blood Transfusion, Adult A blood transfusion is a procedure in which you receive blood through an IV tube. You may need this procedure because of: A bleeding disorder. An illness. An injury. A surgery. The blood may come from someone else (a donor). You may also be able to donate blood for yourself before a surgery. The blood given in a transfusion may be made up of different types of cells. You may get: Red blood cells. These carry oxygen to the cells in the body. Platelets. These help your blood to clot. Plasma. This is the liquid part of your blood. It carries proteins and other substances through the body. White blood cells. These help you fight infections. If you have a clotting disorder, you may also get other types of blood products. Depending on the type of blood product, this procedure may take 1-4 hours to complete. Tell your doctor about: Any bleeding problems you have. Any reactions you have had during a blood transfusion in the past. Any allergies you have. All medicines you are taking, including vitamins, herbs, eye drops, creams, and over-the-counter medicines. Any surgeries you have had. Any medical conditions you have. Whether you are pregnant or may be pregnant. What are the risks? Talk with your health care provider about risks. The most common problems include: A mild allergic reaction. This includes red, swollen areas of skin (hives) and itching. Fever or chills. This may be the body's response to new blood cells received. This may happen during or up to 4 hours after the transfusion. More serious problems may include: A serious allergic reaction. This includes breathing trouble or swelling around the face and lips. Too much fluid in the lungs. This may cause breathing problems. Lung injury. This causes breathing trouble and low oxygen in the blood. This can happen within hours of the transfusion or days later. Too much iron. This can happen after getting many blood  transfusions over a period of time. An infection or virus passed through the blood. This is rare. Donated blood is carefully tested before it is given. Your body's defense system (immune system) trying to attack the new blood cells. This is rare. Symptoms may include fever, chills, nausea, low blood pressure, and low back or chest pain. Donated cells attacking healthy tissues. This is rare. What happens before the procedure? You will have a blood test to find out your blood type. The test also finds out what type of blood your body will accept and matches it to the donor type. If you are going to have a planned surgery, you may be able to donate your own blood. This may be done in case you need a transfusion. You will have your temperature, blood pressure, and pulse checked. You may receive medicine to help prevent an allergic reaction. This may be done if you have had a reaction to a transfusion before. This medicine may be given to you by mouth or through an IV tube. What happens during the procedure?  An IV tube will be put into one of your veins. The bag of blood will be attached to your IV tube. Then, the blood will enter through your vein. Your temperature, blood pressure, and pulse will be checked often. This is done to find early signs of a transfusion reaction. Tell your nurse right away if you have any of these symptoms: Shortness of breath or trouble breathing. Chest or back pain. Fever or chills. Red, swollen areas of skin or itching. If you have any signs   or symptoms of a reaction, your transfusion will be stopped. You may also be given medicine. When the transfusion is finished, your IV tube will be taken out. Pressure may be put on the IV site for a few minutes. A bandage (dressing) will be put on the IV site. The procedure may vary among doctors and hospitals. What happens after the procedure? You will be monitored until you leave the hospital or clinic. This includes  checking your temperature, blood pressure, pulse, breathing rate, and blood oxygen level. Your blood may be tested to see how you have responded to the transfusion. You may be warmed with fluids or blankets. This is done to keep the temperature of your body normal. If you have your procedure in an outpatient setting, you will be told whom to contact to report any reactions. Where to find more information Visit the [employer]: redcross.org Summary A blood transfusion is a procedure in which you receive blood through an IV tube. The blood you are given may be made up of different blood cells. You may receive red blood cells, platelets, plasma, or white blood cells. Your temperature, blood pressure, and pulse will be checked often. After the procedure, your blood may be tested to see how you have responded. This information is not intended to replace advice given to you by your health care provider. Make sure you discuss any questions you have with your health care provider. Document Revised: 05/10/2021 Document Reviewed: 05/10/2021 Elsevier Patient Education  2023 [employer]  

## 2022-07-16 LAB — BPAM RBC
ISSUE DATE / TIME: 202405211114
Unit Type and Rh: 5100

## 2022-07-16 LAB — TYPE AND SCREEN: Antibody Screen: NEGATIVE

## 2022-07-17 ENCOUNTER — Ambulatory Visit
Payer: Medicare Other | Attending: Student in an Organized Health Care Education/Training Program | Admitting: Student in an Organized Health Care Education/Training Program

## 2022-07-17 ENCOUNTER — Encounter: Payer: Self-pay | Admitting: Student in an Organized Health Care Education/Training Program

## 2022-07-17 ENCOUNTER — Telehealth: Payer: Self-pay

## 2022-07-17 VITALS — BP 117/64 | HR 69 | Temp 97.5°F | Resp 18 | Ht 71.0 in | Wt 245.0 lb

## 2022-07-17 DIAGNOSIS — M25562 Pain in left knee: Secondary | ICD-10-CM | POA: Insufficient documentation

## 2022-07-17 DIAGNOSIS — Z96652 Presence of left artificial knee joint: Secondary | ICD-10-CM | POA: Diagnosis not present

## 2022-07-17 DIAGNOSIS — M4726 Other spondylosis with radiculopathy, lumbar region: Secondary | ICD-10-CM

## 2022-07-17 DIAGNOSIS — M5136 Other intervertebral disc degeneration, lumbar region: Secondary | ICD-10-CM | POA: Insufficient documentation

## 2022-07-17 DIAGNOSIS — G894 Chronic pain syndrome: Secondary | ICD-10-CM | POA: Diagnosis not present

## 2022-07-17 DIAGNOSIS — M47812 Spondylosis without myelopathy or radiculopathy, cervical region: Secondary | ICD-10-CM | POA: Diagnosis not present

## 2022-07-17 DIAGNOSIS — M47816 Spondylosis without myelopathy or radiculopathy, lumbar region: Secondary | ICD-10-CM | POA: Diagnosis not present

## 2022-07-17 DIAGNOSIS — M5416 Radiculopathy, lumbar region: Secondary | ICD-10-CM | POA: Insufficient documentation

## 2022-07-17 DIAGNOSIS — M19011 Primary osteoarthritis, right shoulder: Secondary | ICD-10-CM | POA: Insufficient documentation

## 2022-07-17 DIAGNOSIS — G8929 Other chronic pain: Secondary | ICD-10-CM

## 2022-07-17 DIAGNOSIS — M1612 Unilateral primary osteoarthritis, left hip: Secondary | ICD-10-CM | POA: Insufficient documentation

## 2022-07-17 MED ORDER — HYDROCODONE-ACETAMINOPHEN 7.5-325 MG PO TABS: 1.0000 | ORAL_TABLET | Freq: Three times a day (TID) | ORAL | 0 refills | Status: DC | PRN

## 2022-07-17 MED ORDER — GABAPENTIN 600 MG PO TABS
600.0000 mg | ORAL_TABLET | Freq: Every day | ORAL | 5 refills | Status: DC
Start: 2022-07-17 — End: 2023-01-08

## 2022-07-17 MED ORDER — HYDROCODONE-ACETAMINOPHEN 7.5-325 MG PO TABS: 1.0000 | ORAL_TABLET | Freq: Three times a day (TID) | ORAL | 0 refills | Status: AC | PRN

## 2022-07-17 MED ORDER — TIZANIDINE HCL 4 MG PO TABS
4.0000 mg | ORAL_TABLET | Freq: Every day | ORAL | 5 refills | Status: DC
Start: 2022-07-17 — End: 2023-01-08

## 2022-07-17 NOTE — Telephone Encounter (Signed)
Called pt- got her in with Dr Juliann Pares in [facility] on 08/13/22 @ 10:45

## 2022-07-17 NOTE — Progress Notes (Signed)
Safety precautions to be maintained throughout the outpatient stay will include: orient to surroundings, keep bed in low position, maintain call bell within reach at all times, provide assistance with transfer out of bed and ambulation.   Nursing Pain Medication Assessment:  Safety precautions to be maintained throughout the outpatient stay will include: orient to surroundings, keep bed in low position, maintain call bell within reach at all times, provide assistance with transfer out of bed and ambulation.  Medication Inspection Compliance: Pill count conducted under aseptic conditions, in front of the patient. Neither the pills nor the bottle was removed from the patient's sight at any time. Once count was completed pills were immediately returned to the patient in their original bottle.  Medication: Hydrocodone/APAP Pill/Patch Count:  29 of 90 pills remain Pill/Patch Appearance: Markings consistent with prescribed medication Bottle Appearance: Standard pharmacy container. Clearly labeled. Filled Date: 04 / 29 / 2024 Last Medication intake:  Today

## 2022-07-17 NOTE — Patient Instructions (Signed)

## 2022-07-17 NOTE — Progress Notes (Signed)
PROVIDER NOTE: Information contained herein reflects review and annotations entered in association with encounter. Interpretation of such information and data should be left to medically-trained personnel. Information provided to patient can be located elsewhere in the medical record under "Patient Instructions". Document created using STT-dictation technology, any transcriptional errors that may result from process are unintentional.    Patient: Michele Moore  Service Category: E/M  Provider: Edward Jolly, MD  DOB: 1950-05-08  DOS: 07/17/2022  Specialty: Interventional Pain Management  MRN: [id number]  Setting: Ambulatory outpatient  PCP: Duanne Limerick, MD  Type: Established Patient    Referring Provider: Maurice Small, MD  Location: Office  Delivery: Face-to-face     HPI  [name], a 72 y.o. year old female, is here today because of her Chronic knee pain after total replacement of left knee joint [M25.562, G89.29, Z96.652]. [name] primary complain today is Knee Pain (Left), Hip Pain, and Back Pain Last encounter: My last encounter with her was on 02/10/22  Pertinent problems: [name] has Chronic knee pain after total replacement of left knee joint; Lumbar radiculopathy; Lumbar degenerative disc disease; Chronic pain syndrome; Spinal stenosis, lumbar region, with neurogenic claudication; Cervical facet syndrome; Primary osteoarthritis of right shoulder; Sprain of anterior talofibular ligament of right ankle; Severe obesity (BMI 35.0-35.9 with comorbidity) (HCC); CKD (chronic kidney disease), stage III (HCC); Primary osteoarthritis of left hip; and Lumbar spondylosis on their pertinent problem list. Pain Assessment: Severity of Chronic pain is reported as a 8 /10. Location: Back Lower, Left/radiates from lower back into left back. Onset: More than a month ago. Quality: Constant, Aching, Sharp. Timing: Constant. Modifying factor(s): Medications help some. Vitals:  height is 5\' 11"  (1.803  m) and weight is 245 lb (111.1 kg). Her temporal temperature is 97.5 F (36.4 C) (abnormal). Her blood pressure is 117/64 and her pulse is 69. Her respiration is 18 and oxygen saturation is 100%.   Reason for encounter: MM   -having an upcoming endoscopy and colonoscopy for trouble swallowing foods and blood in stool -She receives blood and iron infusions regularly  -s/p left genicular nerve RFA 05/05/22 which provided some pain relief of her chronic knee pain after knee replacement -also endorsing left low back pain with radiation into left hip and anterior thigh, radicular in nature -refill of medications as below     Pharmacotherapy Assessment  Analgesic: Norco 7.5 mg TID PRN #90/month, 22.5    Monitoring: [place] PMP: PDMP reviewed during this encounter.       Pharmacotherapy: No side-effects or adverse reactions reported. Compliance: No problems identified. Effectiveness: Clinically acceptable.  Earlyne Iba, RN  07/17/2022  2:28 PM  Sign when Signing Visit Safety precautions to be maintained throughout the outpatient stay will include: orient to surroundings, keep bed in low position, maintain call bell within reach at all times, provide assistance with transfer out of bed and ambulation.   Nursing Pain Medication Assessment:  Safety precautions to be maintained throughout the outpatient stay will include: orient to surroundings, keep bed in low position, maintain call bell within reach at all times, provide assistance with transfer out of bed and ambulation.  Medication Inspection Compliance: Pill count conducted under aseptic conditions, in front of the patient. Neither the pills nor the bottle was removed from the patient's sight at any time. Once count was completed pills were immediately returned to the patient in their original bottle.  Medication: Hydrocodone/APAP Pill/Patch Count:  29 of 90 pills remain Pill/Patch Appearance:  Markings consistent with prescribed  medication Bottle Appearance: Standard pharmacy container. Clearly labeled. Filled Date: 04 / 29 / 2024 Last Medication intake:  Today    UDS:  Summary  Date Value Ref Range Status  08/06/2021 Note  Final    Comment:    ==================================================================== ToxASSURE Select 13 (MW) ==================================================================== Test                             Result       Flag       Units  Drug Present and Declared for Prescription Verification   Hydrocodone                    1383         EXPECTED   ng/mg creat   Hydromorphone                  260          EXPECTED   ng/mg creat   Norhydrocodone                 557          EXPECTED   ng/mg creat    Sources of hydrocodone include scheduled prescription medications.    Hydromorphone and norhydrocodone are expected metabolites of    hydrocodone. Hydromorphone is also available as a scheduled    prescription medication.  ==================================================================== Test                      Result    Flag   Units      Ref Range   Creatinine              47               mg/dL      >=40 ==================================================================== Declared Medications:  The flagging and interpretation on this report are based on the  following declared medications.  Unexpected results may arise from  inaccuracies in the declared medications.   **Note: The testing scope of this panel includes these medications:   Hydrocodone (Norco)   **Note: The testing scope of this panel does not include the  following reported medications:   Acetaminophen (Norco)  Albuterol (Ventolin HFA)  Allopurinol (Zyloprim)  Apremilast  Betamethasone  Calcipotriene  Clobetasol (Temovate)  Dapagliflozin (Farxiga)  Fish Oil  Fluticasone (Flonase)  Furosemide (Lasix)  Gabapentin (Neurontin)  Hydralazine (Apresoline)  Hydrochlorothiazide (Maxzide)  Iron   Ketoconazole (Nizoral)  Levothyroxine (Synthroid)  Losartan (Cozaar)  Meclizine (Antivert)  Methotrexate  Mometasone (Nasonex)  Montelukast (Singulair)  Multivitamin  Mupirocin (Bactroban)  Omeprazole (Prilosec)  Polyethylene Glycol  Potassium (Klor-Con)  Tizanidine (Zanaflex)  Topical  Topical Diclofenac (Voltaren)  Triamcinolone (Kenalog)  Triamterene (Maxzide)  Vitamin D3 ==================================================================== For clinical consultation, please call [phone]. ====================================================================      ROS  Constitutional: Denies any fever or chills Gastrointestinal: No reported hemesis, hematochezia, vomiting, or acute GI distress Musculoskeletal: Low back pain, left knee pain Neurological: No reported episodes of acute onset apraxia, aphasia, dysarthria, agnosia, amnesia, paralysis, loss of coordination, or loss of consciousness  Medication Review  Apremilast, HYDROcodone-acetaminophen, Multiple Vitamins-Calcium, Polyethyl Glycol-Propyl Glycol, Vitamin D3, acetaminophen, albuterol, allopurinol, betamethasone dipropionate, calcipotriene, clobetasol ointment, dapagliflozin propanediol, diclofenac sodium, fluticasone, furosemide, gabapentin, ketoconazole, levothyroxine, losartan, meclizine, metoprolol tartrate, mometasone, montelukast, mupirocin ointment, nystatin cream, omega-3 acid ethyl esters, omeprazole, potassium chloride SA, tiZANidine, and triamcinolone cream  History Review  Allergy: Ms. Albo is allergic to ampicillin, penicillins, vancomycin, and vibramycin [doxycycline calcium]. Drug: Ms. Attridge  reports no history of drug use. Alcohol:  reports no history of alcohol use. Tobacco:  reports that she quit smoking about 29 years ago. Her smoking use included cigarettes. She has a 40.00 pack-year smoking history. She has never used smokeless tobacco. Social: Ms. Minion  reports that she quit smoking about 29  years ago. Her smoking use included cigarettes. She has a 40.00 pack-year smoking history. She has never used smokeless tobacco. She reports that she does not drink alcohol and does not use drugs. Medical:  has a past medical history of (HFpEF) heart failure with preserved ejection fraction (HCC) (01/13/2014), Allergy, ANA positive, Anemia of chronic renal failure, stage 3 (moderate), unspecified whether stage 3a or 3b CKD (HCC), Aortic atherosclerosis (HCC), Asthma, Cataract, Chronic pain syndrome, Chronic, continuous use of opioids, CKD (chronic kidney disease), stage III (HCC), Complication of anesthesia, DDD (degenerative disc disease), lumbar, Dyspnea on exertion, Dysrhythmia, Family history of adverse reaction to anesthesia, GERD (gastroesophageal reflux disease), Glaucoma, Gout, H/O tooth extraction, Hemorrhoid, History of hiatal hernia, History of kidney stones, Hypertension, Hypothyroidism, Implantable loop recorder present (08/22/2021), Long term current use of immunosuppressive drug, Migraines, Mixed hyperlipidemia, Multiple gastric ulcers, Osteoarthritis, Psoriasis, Psoriatic arthritis (HCC), Rheumatoid arthritis (HCC), SS-A antibody positive, and Wears dentures. Surgical: Ms. Hirani  has a past surgical history that includes Carpal tunnel release; Rotator cuff repair (Right, 1995); Ankle surgery (Right); Tubal ligation; Colonoscopy (2000?); Upper gi endoscopy (2000?); Tonsillectomy; Fusion of talonavicular joint (Right, 08/03/2015); Colonoscopy with propofol (N/A, 10/22/2017); Esophagogastroduodenoscopy (egd) with propofol (N/A, 10/22/2017); polypectomy (N/A, 10/22/2017); Total knee revision (Right, 01/20/2018); Joint replacement (Right); Loop recorder implant (08/22/2021); and Total knee revision (Left, 10/09/2021). Family: family history includes Arthritis in her mother; COPD in her sister; Cancer in her maternal aunt and maternal uncle; Diabetes in her father; Gout in her mother; Heart failure in her  father and mother; Hyperlipidemia in her father; Hypertension in her mother; Stroke in her mother.  Laboratory Chemistry Profile   Renal Lab Results  Component Value Date   BUN 25 07/01/2022   CREATININE 1.79 (H) 07/01/2022   BCR 14 07/01/2022   GFRAA 30 (L) 08/28/2019   GFRNONAA 25 (L) 01/08/2022    Hepatic Lab Results  Component Value Date   AST 21 07/01/2022   ALT 13 07/01/2022   ALBUMIN 3.9 07/01/2022   ALKPHOS 115 07/01/2022    Electrolytes Lab Results  Component Value Date   NA 142 07/01/2022   K 3.8 07/01/2022   CL 104 07/01/2022   CALCIUM 10.4 (H) 07/01/2022   MG 2.0 10/01/2018   PHOS 3.0 09/16/2021    Bone No results found for: "VD25OH", "VD125OH2TOT", "ZO1096EA5", "WU9811BJ4", "25OHVITD1", "25OHVITD2", "25OHVITD3", "TESTOFREE", "TESTOSTERONE"  Inflammation (CRP: Acute Phase) (ESR: Chronic Phase) Lab Results  Component Value Date   CRP 1.6 (H) 09/27/2021   ESRSEDRATE 79 (H) 09/27/2021         Note: Above Lab results reviewed.  Recent Imaging Review  CUP PACEART REMOTE DEVICE CHECK ILR summary report received. Battery status OK. Normal device function. No new symptom, tachy, brady, or pause episodes. 1 new AF episode with EGM, in duration, burden 0.1%.  Hx of SCAF, no OAC.  Monthly summary reports and ROV/PRN LA, CVRS Note: Reviewed        Physical Exam  General appearance: Well nourished, well developed, and well hydrated. In no apparent acute distress Mental status: Alert, oriented  x 3 (person, place, & time)       Respiratory: No evidence of acute respiratory distress Eyes: PERLA Vitals: BP 117/64   Pulse 69   Temp (!) 97.5 F (36.4 C) (Temporal)   Resp 18   Ht 5\' 11"  (1.803 m)   Wt 245 lb (111.1 kg)   SpO2 100%   BMI 34.17 kg/m  BMI: Estimated body mass index is 34.17 kg/m as calculated from the following:   Height as of this encounter: 5\' 11"  (1.803 m).   Weight as of this encounter: 245 lb (111.1 kg). Ideal: Ideal body weight: 70.8  kg (156 lb 1.4 oz) Adjusted ideal body weight: 86.9 kg (191 lb 10.4 oz)    Thoracic Spine Area Exam  Skin & Axial Inspection: No masses, redness, or swelling Alignment: Symmetrical Functional ROM: Unrestricted ROM Stability: No instability detected Muscle Tone/Strength: Functionally intact. No obvious neuro-muscular anomalies detected. Sensory (Neurological): Unimpaired Muscle strength & Tone: No palpable anomalies   Lumbar Spine Area Exam  Skin & Axial Inspection: No masses, redness, or swelling Alignment: Symmetrical Functional ROM: Improved after treatment       Stability: No instability detected Muscle Tone/Strength: Functionally intact. No obvious neuro-muscular anomalies detected. Sensory (Neurological): Musculoskeletal and dermatomal low back pain   Gait & Posture Assessment  Ambulation: Limited Gait: Antalgic Posture: Difficulty standing up straight, due to pain    Lower Extremity Exam      Side: Right lower extremity   Side: Left lower extremity  Stability: No instability observed           Stability: No instability observed          Skin & Extremity Inspection: Skin color, temperature, and hair growth are WNL. No peripheral edema or cyanosis. No masses, redness, swelling, asymmetry, or associated skin lesions. No contractures.   Skin & Extremity Inspection: Surgical scar from prior surgery  Functional ROM: Unrestricted ROM                   Functional ROM: Decreased ROM for hip and knee joints          Muscle Tone/Strength: Functionally intact. No obvious neuro-muscular anomalies detected.   Muscle Tone/Strength: Functionally intact. No obvious neuro-muscular anomalies detected.  Sensory (Neurological): Unimpaired   Sensory (Neurological): Dermatomal pain pattern and arthropathic of left knee  Palpation: No palpable anomalies   Palpation: No palpable anomalies    Assessment   Diagnosis Status  1. 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  Primary osteoarthritis of right shoulder   3. Lumbar spondylosis   4. Primary osteoarthritis of left hip   5. Cervical spondylosis without myelopathy   6. Lumbar radiculopathy   7. Chronic pain syndrome   8. Lumbar degenerative disc disease       Persistent Persistent Persistent   Plan of Care   [name] has a current medication list which includes the following long-term medication(s): albuterol, allopurinol, levothyroxine, metoprolol tartrate, montelukast, omega-3 acid ethyl esters, omeprazole, potassium chloride sa, gabapentin, [DISCONTINUED] fluticasone, and [DISCONTINUED] mometasone.  Pharmacotherapy (Medications Ordered): Meds ordered this encounter  Medications   HYDROcodone-acetaminophen (NORCO) 7.5-325 MG tablet    Sig: Take 1 tablet by mouth every 8 (eight) hours as needed for severe pain. Must last 30 days.    Dispense:  90 tablet    Refill:  0    Chronic Pain: STOP Act (Not applicable) Fill 1 day early if closed on refill date.  Avoid benzodiazepines within 8 hours of opioids   HYDROcodone-acetaminophen (NORCO) 7.5-325 MG tablet    Sig: Take 1 tablet by mouth every 8 (eight) hours as needed for severe pain. Must last 30 days.    Dispense:  90 tablet    Refill:  0    Chronic Pain: STOP Act (Not applicable) Fill 1 day early if closed on refill date. Avoid benzodiazepines within 8 hours of opioids   HYDROcodone-acetaminophen (NORCO) 7.5-325 MG tablet    Sig: Take 1 tablet by mouth every 8 (eight) hours as needed for severe pain. Must last 30 days.    Dispense:  90 tablet    Refill:  0    Chronic Pain: STOP Act (Not applicable) Fill 1 day early if closed on refill date. Avoid benzodiazepines within 8 hours of opioids   gabapentin (NEURONTIN) 600 MG tablet    Sig: Take 1 tablet (600 mg total) by mouth at bedtime.    Dispense:  30 tablet    Refill:  5   tiZANidine (ZANAFLEX) 4 MG tablet    Sig: Take 1 tablet (4 mg total) by mouth at bedtime.    Dispense:  60  tablet    Refill:  5   Orders:  Orders Placed This Encounter  Procedures   ToxASSURE Select 13 (MW), Urine    Volume: 30 ml(s). Minimum 3 ml of urine is needed. Document temperature of fresh sample. Indications: Long term (current) use of opiate analgesic [phone])    Order Specific Question:   Release to patient    Answer:   Immediate    -Can repeat GN RFA after after September -Consider L-ESI after GI studies  Follow-up plan:   Return in about 3 months (around 10/21/2022) for Medication Management, in person.    Recent Visits Date Type Provider Dept  06/09/22 Office Visit Edward Jolly, MD [facility]  05/05/22 Procedure visit Edward Jolly, MD [facility]  Showing recent visits within past 90 days and meeting all other requirements Today's Visits Date Type Provider Dept  07/17/22 Office Visit Edward Jolly, MD [facility]  Showing today's visits and meeting all other requirements Future Appointments No visits were found meeting these conditions. Showing future appointments within next 90 days and meeting all other requirements  I discussed the assessment and treatment plan with the patient. The patient was provided an opportunity to ask questions and all were answered. The patient agreed with the plan and demonstrated an understanding of the instructions.  Patient advised to call back or seek an in-person evaluation if the symptoms or condition worsens.  Duration of encounter: .  Note by: Edward Jolly, MD Date: 07/17/2022; Time: 3:15 PM

## 2022-07-22 ENCOUNTER — Other Ambulatory Visit: Payer: Medicare Other

## 2022-07-22 ENCOUNTER — Inpatient Hospital Stay: Payer: Medicare Other

## 2022-07-22 LAB — TOXASSURE SELECT 13 (MW), URINE

## 2022-07-23 ENCOUNTER — Inpatient Hospital Stay: Payer: Medicare Other

## 2022-07-23 ENCOUNTER — Ambulatory Visit: Payer: Medicare Other

## 2022-07-23 DIAGNOSIS — N183 Chronic kidney disease, stage 3 unspecified: Secondary | ICD-10-CM | POA: Diagnosis not present

## 2022-07-23 DIAGNOSIS — Z79899 Other long term (current) drug therapy: Secondary | ICD-10-CM | POA: Diagnosis not present

## 2022-07-23 DIAGNOSIS — D631 Anemia in chronic kidney disease: Secondary | ICD-10-CM | POA: Diagnosis not present

## 2022-07-23 DIAGNOSIS — D649 Anemia, unspecified: Secondary | ICD-10-CM

## 2022-07-23 DIAGNOSIS — I130 Hypertensive heart and chronic kidney disease with heart failure and stage 1 through stage 4 chronic kidney disease, or unspecified chronic kidney disease: Secondary | ICD-10-CM | POA: Diagnosis not present

## 2022-07-23 LAB — CBC WITH DIFFERENTIAL (CANCER CENTER ONLY)
Abs Immature Granulocytes: 0.01 10*3/uL (ref 0.00–0.07)
Basophils Absolute: 0 10*3/uL (ref 0.0–0.1)
Basophils Relative: 1 %
Eosinophils Absolute: 0.3 10*3/uL (ref 0.0–0.5)
Eosinophils Relative: 5 %
HCT: 23.8 % — ABNORMAL LOW (ref 36.0–46.0)
Hemoglobin: 7.2 g/dL — ABNORMAL LOW (ref 12.0–15.0)
Immature Granulocytes: 0 %
Lymphocytes Relative: 18 %
Lymphs Abs: 1.1 10*3/uL (ref 0.7–4.0)
MCH: 27 pg (ref 26.0–34.0)
MCHC: 30.3 g/dL (ref 30.0–36.0)
MCV: 89.1 fL (ref 80.0–100.0)
Monocytes Absolute: 0.4 10*3/uL (ref 0.1–1.0)
Monocytes Relative: 7 %
Neutro Abs: 4.4 10*3/uL (ref 1.7–7.7)
Neutrophils Relative %: 69 %
Platelet Count: 229 10*3/uL (ref 150–400)
RBC: 2.67 MIL/uL — ABNORMAL LOW (ref 3.87–5.11)
RDW: 17.4 % — ABNORMAL HIGH (ref 11.5–15.5)
WBC Count: 6.3 10*3/uL (ref 4.0–10.5)
nRBC: 0 % (ref 0.0–0.2)

## 2022-07-23 LAB — SAMPLE TO BLOOD BANK

## 2022-07-24 ENCOUNTER — Other Ambulatory Visit: Payer: Self-pay | Admitting: Oncology

## 2022-07-24 DIAGNOSIS — Z79899 Other long term (current) drug therapy: Secondary | ICD-10-CM | POA: Diagnosis not present

## 2022-07-24 DIAGNOSIS — N183 Chronic kidney disease, stage 3 unspecified: Secondary | ICD-10-CM | POA: Diagnosis not present

## 2022-07-24 DIAGNOSIS — D631 Anemia in chronic kidney disease: Secondary | ICD-10-CM | POA: Diagnosis not present

## 2022-07-24 DIAGNOSIS — D649 Anemia, unspecified: Secondary | ICD-10-CM

## 2022-07-24 DIAGNOSIS — I130 Hypertensive heart and chronic kidney disease with heart failure and stage 1 through stage 4 chronic kidney disease, or unspecified chronic kidney disease: Secondary | ICD-10-CM | POA: Diagnosis not present

## 2022-07-24 LAB — PREPARE RBC (CROSSMATCH)

## 2022-07-24 LAB — TYPE AND SCREEN: ABO/RH(D): O POS

## 2022-07-24 LAB — BPAM RBC: Unit Type and Rh: 5100

## 2022-07-25 ENCOUNTER — Inpatient Hospital Stay: Payer: Medicare Other

## 2022-07-25 ENCOUNTER — Other Ambulatory Visit: Payer: Self-pay

## 2022-07-25 DIAGNOSIS — D649 Anemia, unspecified: Secondary | ICD-10-CM

## 2022-07-25 DIAGNOSIS — D631 Anemia in chronic kidney disease: Secondary | ICD-10-CM | POA: Diagnosis not present

## 2022-07-25 DIAGNOSIS — N183 Chronic kidney disease, stage 3 unspecified: Secondary | ICD-10-CM | POA: Diagnosis not present

## 2022-07-25 DIAGNOSIS — Z79899 Other long term (current) drug therapy: Secondary | ICD-10-CM | POA: Diagnosis not present

## 2022-07-25 DIAGNOSIS — I130 Hypertensive heart and chronic kidney disease with heart failure and stage 1 through stage 4 chronic kidney disease, or unspecified chronic kidney disease: Secondary | ICD-10-CM | POA: Diagnosis not present

## 2022-07-25 LAB — TYPE AND SCREEN
Antibody Screen: NEGATIVE
Unit division: 0

## 2022-07-25 MED ORDER — ACETAMINOPHEN 325 MG PO TABS
650.0000 mg | ORAL_TABLET | Freq: Once | ORAL | Status: AC
  Administered 2022-07-25: 650 mg via ORAL
  Filled 2022-07-25: qty 2

## 2022-07-25 MED ORDER — SODIUM CHLORIDE 0.9% IV SOLUTION
250.0000 mL | Freq: Once | INTRAVENOUS | Status: AC
  Administered 2022-07-25: 250 mL via INTRAVENOUS
  Filled 2022-07-25: qty 250

## 2022-07-25 MED ORDER — DIPHENHYDRAMINE HCL 25 MG PO CAPS
25.0000 mg | ORAL_CAPSULE | Freq: Once | ORAL | Status: AC
  Administered 2022-07-25: 25 mg via ORAL
  Filled 2022-07-25: qty 1

## 2022-07-25 NOTE — Progress Notes (Signed)
Per Arthur Holms pt has been referred to [facility] cardiology in [facility] per pt request.  Telephone 07/17/2022 [facility] at [facility]   Michele Moore, Shelva Majestic  All Conversations (Newest Message First) Jul 17, 2022 Me   07/17/22 3:49 PM Note Called pt- got her in with Dr Juliann Pares in [facility] on 08/13/22 @ 10:45- that was the soonest they could get her in. I made it on 5/23. I believe they also have her on a cancellation list

## 2022-07-26 LAB — TYPE AND SCREEN

## 2022-07-26 LAB — BPAM RBC
Blood Product Expiration Date: 202407022359
ISSUE DATE / TIME: 202405311148
Unit Type and Rh: 5100

## 2022-07-28 ENCOUNTER — Inpatient Hospital Stay: Payer: Medicare Other

## 2022-07-28 ENCOUNTER — Other Ambulatory Visit: Payer: Self-pay

## 2022-07-28 ENCOUNTER — Inpatient Hospital Stay: Payer: Medicare Other | Attending: Oncology

## 2022-07-28 VITALS — BP 122/56

## 2022-07-28 DIAGNOSIS — R21 Rash and other nonspecific skin eruption: Secondary | ICD-10-CM | POA: Insufficient documentation

## 2022-07-28 DIAGNOSIS — D649 Anemia, unspecified: Secondary | ICD-10-CM

## 2022-07-28 DIAGNOSIS — B370 Candidal stomatitis: Secondary | ICD-10-CM | POA: Insufficient documentation

## 2022-07-28 DIAGNOSIS — D631 Anemia in chronic kidney disease: Secondary | ICD-10-CM | POA: Diagnosis not present

## 2022-07-28 DIAGNOSIS — Z8051 Family history of malignant neoplasm of kidney: Secondary | ICD-10-CM | POA: Insufficient documentation

## 2022-07-28 DIAGNOSIS — Z87891 Personal history of nicotine dependence: Secondary | ICD-10-CM | POA: Insufficient documentation

## 2022-07-28 DIAGNOSIS — N183 Chronic kidney disease, stage 3 unspecified: Secondary | ICD-10-CM

## 2022-07-28 DIAGNOSIS — Z803 Family history of malignant neoplasm of breast: Secondary | ICD-10-CM | POA: Diagnosis not present

## 2022-07-28 DIAGNOSIS — N1832 Chronic kidney disease, stage 3b: Secondary | ICD-10-CM | POA: Insufficient documentation

## 2022-07-28 LAB — SAMPLE TO BLOOD BANK

## 2022-07-28 LAB — HEMOGLOBIN AND HEMATOCRIT, BLOOD
HCT: 24.2 % — ABNORMAL LOW (ref 36.0–46.0)
Hemoglobin: 7.4 g/dL — ABNORMAL LOW (ref 12.0–15.0)

## 2022-07-28 MED ORDER — EPOETIN ALFA-EPBX 40000 UNIT/ML IJ SOLN
40000.0000 [IU] | Freq: Once | INTRAMUSCULAR | Status: AC
  Administered 2022-07-28: 40000 [IU] via SUBCUTANEOUS
  Filled 2022-07-28: qty 1

## 2022-07-30 NOTE — Progress Notes (Signed)
Carelink Summary Report / Loop Recorder 

## 2022-08-04 ENCOUNTER — Encounter: Payer: Self-pay | Admitting: Oncology

## 2022-08-04 ENCOUNTER — Inpatient Hospital Stay (HOSPITAL_BASED_OUTPATIENT_CLINIC_OR_DEPARTMENT_OTHER): Payer: Medicare Other | Admitting: Oncology

## 2022-08-04 ENCOUNTER — Inpatient Hospital Stay: Payer: Medicare Other

## 2022-08-04 ENCOUNTER — Ambulatory Visit (INDEPENDENT_AMBULATORY_CARE_PROVIDER_SITE_OTHER): Payer: Medicare Other

## 2022-08-04 VITALS — BP 113/68

## 2022-08-04 VITALS — BP 113/68 | HR 67 | Temp 97.6°F | Resp 20 | Wt 239.9 lb

## 2022-08-04 DIAGNOSIS — N183 Chronic kidney disease, stage 3 unspecified: Secondary | ICD-10-CM

## 2022-08-04 DIAGNOSIS — Z87891 Personal history of nicotine dependence: Secondary | ICD-10-CM | POA: Diagnosis not present

## 2022-08-04 DIAGNOSIS — R21 Rash and other nonspecific skin eruption: Secondary | ICD-10-CM | POA: Insufficient documentation

## 2022-08-04 DIAGNOSIS — B370 Candidal stomatitis: Secondary | ICD-10-CM | POA: Insufficient documentation

## 2022-08-04 DIAGNOSIS — N1832 Chronic kidney disease, stage 3b: Secondary | ICD-10-CM

## 2022-08-04 DIAGNOSIS — D649 Anemia, unspecified: Secondary | ICD-10-CM

## 2022-08-04 DIAGNOSIS — D631 Anemia in chronic kidney disease: Secondary | ICD-10-CM

## 2022-08-04 DIAGNOSIS — R002 Palpitations: Secondary | ICD-10-CM | POA: Diagnosis not present

## 2022-08-04 DIAGNOSIS — Z8051 Family history of malignant neoplasm of kidney: Secondary | ICD-10-CM | POA: Diagnosis not present

## 2022-08-04 LAB — CBC (CANCER CENTER ONLY)
HCT: 25.6 % — ABNORMAL LOW (ref 36.0–46.0)
Hemoglobin: 7.6 g/dL — ABNORMAL LOW (ref 12.0–15.0)
MCH: 26.6 pg (ref 26.0–34.0)
MCHC: 29.7 g/dL — ABNORMAL LOW (ref 30.0–36.0)
MCV: 89.5 fL (ref 80.0–100.0)
Platelet Count: 276 10*3/uL (ref 150–400)
RBC: 2.86 MIL/uL — ABNORMAL LOW (ref 3.87–5.11)
RDW: 17.4 % — ABNORMAL HIGH (ref 11.5–15.5)
WBC Count: 6.2 10*3/uL (ref 4.0–10.5)
nRBC: 0 % (ref 0.0–0.2)

## 2022-08-04 LAB — CUP PACEART REMOTE DEVICE CHECK
Date Time Interrogation Session: 20240609230502
Implantable Pulse Generator Implant Date: 20230629

## 2022-08-04 LAB — SAMPLE TO BLOOD BANK

## 2022-08-04 MED ORDER — NYSTATIN 100000 UNIT/ML MT SUSP: 5.0000 mL | Freq: Four times a day (QID) | OROMUCOSAL | 0 refills | Status: DC

## 2022-08-04 MED ORDER — PREDNISONE 10 MG (21) PO TBPK: ORAL_TABLET | ORAL | 0 refills | Status: DC

## 2022-08-04 MED ORDER — EPOETIN ALFA-EPBX 40000 UNIT/ML IJ SOLN
40000.0000 [IU] | Freq: Once | INTRAMUSCULAR | Status: AC
  Administered 2022-08-04: 40000 [IU] via SUBCUTANEOUS
  Filled 2022-08-04: qty 1

## 2022-08-04 NOTE — Assessment & Plan Note (Signed)
Encourage oral hydration and avoid nephrotoxins.   

## 2022-08-04 NOTE — Assessment & Plan Note (Signed)
Possible allergic reaction secondary to recent blood transfusion. Recommend steroid tapering course.  Prescription sent to pharmacy

## 2022-08-04 NOTE — Progress Notes (Signed)
Patient here for injections starts complaining of burning and itching of red spots that she states came up after blood transfusion. Patient states she is breaking out with the red spots on neck line/ chest area also a "red knots" on back of neck and underneath arm pits. Also states she is having a water sensitivity states "water feels as if its burning my skin."

## 2022-08-04 NOTE — Progress Notes (Signed)
Hematology/Oncology Progress note [phone] [phone]       Clinic Day:  08/04/2022  ASSESSMENT & PLAN:   Anemia due to stage 3b chronic kidney disease (HCC) Labs reviewed and discussed with patient.  Hemoglobin remains low despite Venofer and EPO.  Proceed with Retacrit 69629 units today Ferritin decreased, <200, recommend Venofer treatments to further increase iron level. ? Suspect GI bleeding. Follow-up with gastroenterology for workup- she plans to call Dr. Annabell Sabal office  CKD (chronic kidney disease), stage III (HCC) Encourage oral hydration and avoid nephrotoxins.    Skin rash Possible allergic reaction secondary to recent blood transfusion. Recommend steroid tapering course.  Prescription sent to pharmacy  [place] Recommend nystatin swish and spit.  Prescription sent to pharmacy.   No orders of the defined types were placed in this encounter. Keep currently scheduled appointment. All questions were answered. The patient knows to call the clinic with any problems, questions or concerns.  Rickard Patience, MD, PhD [facility] Hematology Oncology 08/04/2022    Chief Complaint: Michele Moore is a 72 y.o. female presents for anemia in stage IIIb chronic kidney disease   PERTINENT ONCOLOGY HISTORY Michele Moore is a 72 y.o.afemale who has above oncology history reviewed by me today presented for follow up visit for management of anemia in CKD  PERTINENT HEMATOLOGY HISTORY Patient previously followed up by Dr.Corcoran, patient switched care to me on 08/30/20 Extensive medical record review was performed by me  # anemia in stage IIIb chronic kidney disease.  Work-up on 04/23/2020 revealed a hematocrit of 35.0, hemoglobin 11.4, MCV 92.3, platelets 168,000, WBC 4,600 with an ANC of 3000. Ferritin was 107 with an iron saturation of 37% and a TIBC of 315. Sed rate was 54 and CRP was 0.9.  Vitamin B12 was 429 and folate >100.0.   10/22/2017 Colonoscopy for heme  positive stool revealed one 7 mm polyp in the ascending colon, one 3 mm polyp in the transverse colon, and three 1 to 4 mm polyps in the sigmoid colon. There were non-bleeding internal hemorrhoids. There were petechia(e) in the rectum. Pathology showed two tubular adenomas and one hyperplastic polyp. EGD on 10/22/2017 showed gastritis and one gastric polyp.  She has Sjogren's syndrome, rheumatoid arthritis, hypertension, and proteinuria.  She takes oral iron once a day.  Patient follows up with pain clinic for pain management.  She previously tolerated IV Venofer treatments.  INTERVAL HISTORY Michele Moore is a 72 y.o. female who has above history reviewed by me today presents for follow up visit for anemia secondary to chronic kidney disease. Fatigue has improved.  Patient has received Venofer treatments and Epo treatments.no new complaints. Patient states she is breaking out with the red spots on neck line/ chest area also a "red knots" on back of neck and underneath arm pits     Past Medical History:  Diagnosis Date   (HFpEF) heart failure with preserved ejection fraction (HCC) 01/13/2014   a.) TTE 01/13/2014: EF 45%, apical HK, mod LVH, mild MAC, mild LAE/RVE, mod MR/TR/PR; b.) TTE 10/01/2018: EF 55-60%, mild MVH, mild LAE, triv MR/TR, G1DD   Allergy    ANA positive    Anemia of chronic renal failure, stage 3 (moderate), unspecified whether stage 3a or 3b CKD (HCC)    Aortic atherosclerosis (HCC)    Asthma    Cataract    Chronic pain syndrome    a.) followed by [facility] Cherylann Ratel, MD)   Chronic, continuous use of opioids  CKD (chronic kidney disease), stage III (HCC)    Complication of anesthesia    a.) anesthesia awareness/premature emergence during colonoscopy   DDD (degenerative disc disease), lumbar    Dyspnea on exertion    Dysrhythmia    IRREGULAR HEART BEAT   Family history of adverse reaction to anesthesia    sister difficult to put to sleep   GERD  (gastroesophageal reflux disease)    Glaucoma    Gout    H/O tooth extraction    all lower teeth 1/19   Hemorrhoid    History of hiatal hernia    History of kidney stones    Hypertension    Hypothyroidism    Implantable loop recorder present 08/22/2021   a.) s/p implantation of [employer] LINQ Reveal ILR; serial #: [id number]   Long term current use of immunosuppressive drug    a.) MTX + apremilast   Migraines    Mixed hyperlipidemia    Multiple gastric ulcers    Osteoarthritis    Psoriasis    Psoriatic arthritis (HCC)    a.) on apremilast   Rheumatoid arthritis (HCC)    a.) on MTX   SS-A antibody positive    Wears dentures    partial upper and lower    Past Surgical History:  Procedure Laterality Date   ANKLE SURGERY Right    CARPAL TUNNEL RELEASE     x3   COLONOSCOPY  2000?   COLONOSCOPY WITH PROPOFOL N/A 10/22/2017   Procedure: COLONOSCOPY WITH PROPOFOL;  Surgeon: Midge Minium, MD;  Location: [facility];  Service: Endoscopy;  Laterality: N/A;   ESOPHAGOGASTRODUODENOSCOPY (EGD) WITH PROPOFOL N/A 10/22/2017   Procedure: ESOPHAGOGASTRODUODENOSCOPY (EGD) WITH PROPOFOL;  Surgeon: Midge Minium, MD;  Location: [facility];  Service: Endoscopy;  Laterality: N/A;   FUSION OF TALONAVICULAR JOINT Right 08/03/2015   Procedure: TAILOR NAVICULAR JOINT FUSION - RIGHT ;  Surgeon: Gwyneth Revels, DPM;  Location: [facility];  Service: Podiatry;  Laterality: Right;   JOINT REPLACEMENT Right    knee x 3 ,right x1 and left x2   LOOP RECORDER IMPLANT  08/22/2021   Procedure: LOOP RECORDER IMPLANT; Location: [facility]; Surgeon: Sherryl Manges, MD   POLYPECTOMY N/A 10/22/2017   Procedure: POLYPECTOMY;  Surgeon: Midge Minium, MD;  Location: [facility];  Service: Endoscopy;  Laterality: N/A;   ROTATOR CUFF REPAIR Right 1995   TONSILLECTOMY     TOTAL KNEE REVISION Right 01/20/2018   Procedure: POLYETHYLENE EXCHANGE;  Surgeon: Donato Heinz, MD;  Location: [facility];  Service:  Orthopedics;  Laterality: Right;   TOTAL KNEE REVISION Left 10/09/2021   Procedure: LEFT TOTAL KNEE REVISION WITH POLYETHYLENE EXCHANGE.;  Surgeon: Donato Heinz, MD;  Location: [facility] OR[facility];  Service: Orthopedics;  Laterality: Left;   TUBAL LIGATION     UPPER GI ENDOSCOPY  2000?    Family History  Problem Relation Age of Onset   Heart failure Mother    Gout Mother    Arthritis Mother    Hypertension Mother    Stroke Mother    Heart failure Father    Diabetes Father    Hyperlipidemia Father    COPD Sister    Cancer Maternal Aunt        breast   Cancer Maternal Uncle        kidney   Breast cancer Neg Hx     Social History:  reports that she quit smoking about 29 years ago. Her smoking use included cigarettes. She has a  40.00 pack-year smoking history. She has never used smokeless tobacco. She reports that she does not drink alcohol and does not use drugs.  Allergies:  Allergies  Allergen Reactions   Ampicillin Swelling   Penicillins Anaphylaxis and Swelling    IgE = 10 (11/05/2017)  Has patient had a PCN reaction causing immediate rash, facial/tongue/throat swelling, SOB or lightheadedness with hypotension: Yes Has patient had a PCN reaction causing severe rash involving mucus membranes or skin necrosis: No Has patient had a PCN reaction that required hospitalization: No Has patient had a PCN reaction occurring within the last 10 years: Yes If all of the above answers are "NO", then may proceed with Cephalosporin use.   Vancomycin Rash    Other reaction(s): Other (see comments)   Vibramycin [Doxycycline Calcium] Rash    Current Medications: Current Outpatient Medications  Medication Sig Dispense Refill   acetaminophen (TYLENOL) 650 MG CR tablet Take 650 mg by mouth every 8 (eight) hours as needed for pain.     albuterol (VENTOLIN HFA) 108 (90 Base) MCG/ACT inhaler Inhale 2 puffs into the lungs every 6 (six) hours as needed for wheezing or shortness of breath. 8 g 2    allopurinol (ZYLOPRIM) 100 MG tablet Take 200 mg by mouth every morning.      betamethasone dipropionate 0.05 % cream APPLY TO PSORIASIS ON HANDS TWICE DAILY ONLY. AVOID FACE, GROIN AND AXILLA 45 g 0   calcipotriene (DOVONOX) 0.005 % cream APPLY TOPICALLY TO PSORIASIS AREAS ON LEGS AND FEET ONCE OR TWICE DAILY 540 g 1   Cholecalciferol (VITAMIN D3) 2000 units TABS Take 2,000 Units by mouth daily.     clobetasol ointment (TEMOVATE) 0.05 % Apply topically 2 (two) times daily.     diclofenac sodium (VOLTAREN) 1 % GEL Apply 2 g topically 3 (three) times daily.      FARXIGA 10 MG TABS tablet Take 10 mg by mouth every morning.     furosemide (LASIX) 40 MG tablet Take 40 mg by mouth daily.     gabapentin (NEURONTIN) 600 MG tablet Take 1 tablet (600 mg total) by mouth at bedtime. 30 tablet 5   HYDROcodone-acetaminophen (NORCO) 7.5-325 MG tablet Take 1 tablet by mouth every 8 (eight) hours as needed for severe pain. Must last 30 days. 90 tablet 0   [START ON 08/22/2022] HYDROcodone-acetaminophen (NORCO) 7.5-325 MG tablet Take 1 tablet by mouth every 8 (eight) hours as needed for severe pain. Must last 30 days. 90 tablet 0   [START ON 09/21/2022] HYDROcodone-acetaminophen (NORCO) 7.5-325 MG tablet Take 1 tablet by mouth every 8 (eight) hours as needed for severe pain. Must last 30 days. 90 tablet 0   ketoconazole (NIZORAL) 2 % cream Apply topically daily as needed. 15 g 2   levothyroxine (SYNTHROID) 150 MCG tablet Take 1 tablet (150 mcg total) by mouth daily. 90 tablet 1   losartan (COZAAR) 25 MG tablet Take 25 mg by mouth daily.     meclizine (ANTIVERT) 12.5 MG tablet TAKE 1 TABLET(12.5 MG) BY MOUTH THREE TIMES DAILY AS NEEDED FOR DIZZINESS 30 tablet 1   metoprolol tartrate (LOPRESSOR) 25 MG tablet Take 1 tablet (25 mg total) by mouth 2 (two) times daily. 180 tablet 2   montelukast (SINGULAIR) 10 MG tablet TAKE 1 TABLET(10 MG) BY MOUTH AT BEDTIME 90 tablet 1   Multiple Vitamins-Calcium (ONE-A-DAY WOMENS  FORMULA PO) Take 1 tablet by mouth daily.     mupirocin ointment (BACTROBAN) 2 % Apply 1 application topically 2 (  two) times daily. 22 g 0   nystatin (MYCOSTATIN) 100000 UNIT/ML suspension Take 5 mLs (500,000 Units total) by mouth 4 (four) times daily. 60 mL 0   nystatin cream (MYCOSTATIN) Apply 1 Application topically 2 (two) times daily. 30 g 0   omega-3 acid ethyl esters (LOVAZA) 1 g capsule Take 1 capsule (1 g total) by mouth 2 (two) times daily. 180 capsule 1   omeprazole (PRILOSEC) 40 MG capsule TAKE 1 CAPSULE(40 MG) BY MOUTH DAILY 90 capsule 1   OTEZLA 30 MG TABS Take 1 tablet by mouth daily.     Polyethyl Glycol-Propyl Glycol (SYSTANE OP) Place 1 drop into both eyes daily as needed (dry eyes).     potassium chloride SA (KLOR-CON M) 20 MEQ tablet Take 1 tablet (20 mEq total) by mouth daily. 90 tablet 1   predniSONE (STERAPRED UNI-PAK 21 TAB) 10 MG (21) TBPK tablet Day 1 take 6 tablets, Day 2 take 5 tablets, Day 3 take 4 tablets, Day 4 take 3 tablets, Day 5 take 2 tablets D6 take 1 tablet 1 each 0   tiZANidine (ZANAFLEX) 4 MG tablet Take 1 tablet (4 mg total) by mouth at bedtime. 60 tablet 5   triamcinolone (KENALOG) 0.1 % Apply 1 application topically 2 (two) times daily. 30 g 0   No current facility-administered medications for this visit.    Review of Systems  Constitutional:  Negative for chills, diaphoresis, fever, malaise/fatigue and weight loss.  HENT:  Negative for congestion, ear discharge, ear pain, hearing loss, nosebleeds, sinus pain, sore throat and tinnitus.   Eyes:        Glaucoma  Respiratory:  Negative for cough, hemoptysis, sputum production and shortness of breath (on exertion, due to asthma).   Cardiovascular:  Positive for leg swelling (right ankle). Negative for chest pain and palpitations.  Gastrointestinal:  Negative for abdominal pain, blood in stool, constipation, diarrhea, heartburn, melena, nausea and vomiting.  Genitourinary:  Negative for dysuria,  frequency, hematuria and urgency.  Musculoskeletal:  Positive for joint pain (arthritis). Negative for back pain, myalgias and neck pain.       Status post knee replacement  Skin:  Positive for itching and rash.  Neurological:  Positive for sensory change (foot numbness s/p surgery). Negative for dizziness, tingling, weakness and headaches.  Endo/Heme/Allergies:  Does not bruise/bleed easily.  Psychiatric/Behavioral:  Negative for depression and memory loss. The patient is not nervous/anxious and does not have insomnia.   All other systems reviewed and are negative.   Performance status (ECOG): 1-2  Vitals Blood pressure 113/68, pulse 67, temperature 97.6 F (36.4 C), resp. rate 20, weight 239 lb 14.4 oz (108.8 kg), SpO2 100 %.   Physical Exam Vitals and nursing note reviewed.  Constitutional:      General: She is not in acute distress.    Appearance: She is not diaphoretic.  HENT:     Mouth/Throat:     Comments: Thrush Eyes:     General: No scleral icterus.    Conjunctiva/sclera: Conjunctivae normal.  Abdominal:     Palpations: Abdomen is soft.  Skin:    General: Skin is warm.     Findings: Rash present.  Neurological:     Mental Status: She is alert and oriented to person, place, and time.  Psychiatric:        Behavior: Behavior normal.    Labs    Latest Ref Rng & Units 08/04/2022    1:05 PM 07/28/2022    2:38 PM 07/23/2022  1:51 PM  CBC  WBC 4.0 - 10.5 K/uL 6.2   6.3   Hemoglobin 12.0 - 15.0 g/dL 7.6  7.4  7.2   Hematocrit 36.0 - 46.0 % 25.6  24.2  23.8   Platelets 150 - 400 K/uL 276   229       Latest Ref Rng & Units 07/01/2022    3:42 PM 01/08/2022    3:31 PM 12/18/2021    7:48 PM  CMP  Glucose 70 - 99 mg/dL 94  540  981   BUN 8 - 27 mg/dL 25  35  31   Creatinine 0.57 - 1.00 mg/dL 1.91  4.78  2.95   Sodium 134 - 144 mmol/L 142  138  138   Potassium 3.5 - 5.2 mmol/L 3.8  3.9  4.0   Chloride 96 - 106 mmol/L 104  102  105   CO2 20 - 29 mmol/L 23  25  25     Calcium 8.7 - 10.3 mg/dL 62.1  9.8  30.8   Total Protein 6.0 - 8.5 g/dL 6.2  7.4    Total Bilirubin 0.0 - 1.2 mg/dL <6.5  0.4    Alkaline Phos 44 - 121 IU/L 115  53    AST 0 - 40 IU/L 21  21    ALT 0 - 32 IU/L 13  10

## 2022-08-04 NOTE — Assessment & Plan Note (Signed)
Recommend nystatin swish and spit.  Prescription sent to pharmacy.

## 2022-08-04 NOTE — Assessment & Plan Note (Signed)
Labs reviewed and discussed with patient.  Hemoglobin remains low despite Venofer and EPO.  Proceed with Retacrit 16109 units today Ferritin decreased, <200, recommend Venofer treatments to further increase iron level. ? Suspect GI bleeding. Follow-up with gastroenterology for workup- she plans to call Dr. Annabell Sabal office

## 2022-08-05 ENCOUNTER — Ambulatory Visit: Payer: Medicare Other

## 2022-08-05 ENCOUNTER — Inpatient Hospital Stay: Payer: Medicare Other

## 2022-08-05 DIAGNOSIS — N2581 Secondary hyperparathyroidism of renal origin: Secondary | ICD-10-CM | POA: Diagnosis not present

## 2022-08-05 DIAGNOSIS — I10 Essential (primary) hypertension: Secondary | ICD-10-CM | POA: Diagnosis not present

## 2022-08-05 DIAGNOSIS — D631 Anemia in chronic kidney disease: Secondary | ICD-10-CM | POA: Diagnosis not present

## 2022-08-05 DIAGNOSIS — N1832 Chronic kidney disease, stage 3b: Secondary | ICD-10-CM | POA: Diagnosis not present

## 2022-08-05 DIAGNOSIS — R809 Proteinuria, unspecified: Secondary | ICD-10-CM | POA: Diagnosis not present

## 2022-08-05 DIAGNOSIS — N184 Chronic kidney disease, stage 4 (severe): Secondary | ICD-10-CM | POA: Diagnosis not present

## 2022-08-05 MED FILL — Iron Sucrose Inj 20 MG/ML (Fe Equiv): INTRAVENOUS | Qty: 10 | Status: AC

## 2022-08-06 ENCOUNTER — Inpatient Hospital Stay: Payer: Medicare Other

## 2022-08-06 VITALS — BP 119/56 | HR 55 | Temp 98.2°F | Resp 18

## 2022-08-06 DIAGNOSIS — R21 Rash and other nonspecific skin eruption: Secondary | ICD-10-CM | POA: Diagnosis not present

## 2022-08-06 DIAGNOSIS — D649 Anemia, unspecified: Secondary | ICD-10-CM

## 2022-08-06 DIAGNOSIS — N1832 Chronic kidney disease, stage 3b: Secondary | ICD-10-CM | POA: Diagnosis not present

## 2022-08-06 DIAGNOSIS — Z87891 Personal history of nicotine dependence: Secondary | ICD-10-CM | POA: Diagnosis not present

## 2022-08-06 DIAGNOSIS — B370 Candidal stomatitis: Secondary | ICD-10-CM | POA: Diagnosis not present

## 2022-08-06 DIAGNOSIS — Z8051 Family history of malignant neoplasm of kidney: Secondary | ICD-10-CM | POA: Diagnosis not present

## 2022-08-06 DIAGNOSIS — N183 Chronic kidney disease, stage 3 unspecified: Secondary | ICD-10-CM

## 2022-08-06 DIAGNOSIS — D631 Anemia in chronic kidney disease: Secondary | ICD-10-CM | POA: Diagnosis not present

## 2022-08-06 MED ORDER — SODIUM CHLORIDE 0.9 % IV SOLN
200.0000 mg | Freq: Once | INTRAVENOUS | Status: DC
  Filled 2022-08-06: qty 10

## 2022-08-06 MED ORDER — SODIUM CHLORIDE 0.9 % IV SOLN
200.0000 mg | Freq: Once | INTRAVENOUS | Status: AC
  Administered 2022-08-06: 200 mg via INTRAVENOUS
  Filled 2022-08-06: qty 200

## 2022-08-06 MED ORDER — SODIUM CHLORIDE 0.9 % IV SOLN
INTRAVENOUS | Status: DC | PRN
  Filled 2022-08-06: qty 250

## 2022-08-06 NOTE — Patient Instructions (Signed)

## 2022-08-08 ENCOUNTER — Other Ambulatory Visit: Payer: Self-pay

## 2022-08-08 MED FILL — Iron Sucrose Inj 20 MG/ML (Fe Equiv): INTRAVENOUS | Qty: 10 | Status: AC

## 2022-08-08 NOTE — Progress Notes (Unsigned)
Hol 

## 2022-08-11 ENCOUNTER — Inpatient Hospital Stay: Payer: Medicare Other

## 2022-08-11 VITALS — BP 121/60

## 2022-08-11 DIAGNOSIS — B370 Candidal stomatitis: Secondary | ICD-10-CM | POA: Diagnosis not present

## 2022-08-11 DIAGNOSIS — R21 Rash and other nonspecific skin eruption: Secondary | ICD-10-CM | POA: Diagnosis not present

## 2022-08-11 DIAGNOSIS — D631 Anemia in chronic kidney disease: Secondary | ICD-10-CM | POA: Diagnosis not present

## 2022-08-11 DIAGNOSIS — N183 Chronic kidney disease, stage 3 unspecified: Secondary | ICD-10-CM

## 2022-08-11 DIAGNOSIS — N1832 Chronic kidney disease, stage 3b: Secondary | ICD-10-CM | POA: Diagnosis not present

## 2022-08-11 DIAGNOSIS — D649 Anemia, unspecified: Secondary | ICD-10-CM

## 2022-08-11 DIAGNOSIS — Z87891 Personal history of nicotine dependence: Secondary | ICD-10-CM | POA: Diagnosis not present

## 2022-08-11 DIAGNOSIS — Z8051 Family history of malignant neoplasm of kidney: Secondary | ICD-10-CM | POA: Diagnosis not present

## 2022-08-11 LAB — CBC (CANCER CENTER ONLY)
HCT: 25.5 % — ABNORMAL LOW (ref 36.0–46.0)
Hemoglobin: 7.7 g/dL — ABNORMAL LOW (ref 12.0–15.0)
MCH: 27.1 pg (ref 26.0–34.0)
MCHC: 30.2 g/dL (ref 30.0–36.0)
MCV: 89.8 fL (ref 80.0–100.0)
Platelet Count: 244 10*3/uL (ref 150–400)
RBC: 2.84 MIL/uL — ABNORMAL LOW (ref 3.87–5.11)
RDW: 18.5 % — ABNORMAL HIGH (ref 11.5–15.5)
WBC Count: 8.7 10*3/uL (ref 4.0–10.5)
nRBC: 0 % (ref 0.0–0.2)

## 2022-08-11 LAB — SAMPLE TO BLOOD BANK

## 2022-08-11 MED ORDER — EPOETIN ALFA-EPBX 40000 UNIT/ML IJ SOLN
40000.0000 [IU] | Freq: Once | INTRAMUSCULAR | Status: AC
  Administered 2022-08-11: 40000 [IU] via SUBCUTANEOUS
  Filled 2022-08-11: qty 1

## 2022-08-11 MED FILL — Iron Sucrose Inj 20 MG/ML (Fe Equiv): INTRAVENOUS | Qty: 10 | Status: AC

## 2022-08-12 ENCOUNTER — Ambulatory Visit: Payer: Medicare Other

## 2022-08-12 ENCOUNTER — Inpatient Hospital Stay: Payer: Medicare Other

## 2022-08-12 VITALS — BP 116/50 | HR 53 | Temp 97.8°F | Resp 16

## 2022-08-12 DIAGNOSIS — R21 Rash and other nonspecific skin eruption: Secondary | ICD-10-CM | POA: Diagnosis not present

## 2022-08-12 DIAGNOSIS — D631 Anemia in chronic kidney disease: Secondary | ICD-10-CM | POA: Diagnosis not present

## 2022-08-12 DIAGNOSIS — Z8051 Family history of malignant neoplasm of kidney: Secondary | ICD-10-CM | POA: Diagnosis not present

## 2022-08-12 DIAGNOSIS — N183 Chronic kidney disease, stage 3 unspecified: Secondary | ICD-10-CM

## 2022-08-12 DIAGNOSIS — N1832 Chronic kidney disease, stage 3b: Secondary | ICD-10-CM | POA: Diagnosis not present

## 2022-08-12 DIAGNOSIS — Z87891 Personal history of nicotine dependence: Secondary | ICD-10-CM | POA: Diagnosis not present

## 2022-08-12 DIAGNOSIS — B370 Candidal stomatitis: Secondary | ICD-10-CM | POA: Diagnosis not present

## 2022-08-12 DIAGNOSIS — D649 Anemia, unspecified: Secondary | ICD-10-CM

## 2022-08-12 MED ORDER — SODIUM CHLORIDE 0.9 % IV SOLN
200.0000 mg | Freq: Once | INTRAVENOUS | Status: AC
  Administered 2022-08-12: 200 mg via INTRAVENOUS
  Filled 2022-08-12: qty 10

## 2022-08-12 MED ORDER — SODIUM CHLORIDE 0.9 % IV SOLN
INTRAVENOUS | Status: DC
  Filled 2022-08-12: qty 250

## 2022-08-12 NOTE — Patient Instructions (Signed)

## 2022-08-14 ENCOUNTER — Ambulatory Visit: Payer: Medicare Other | Admitting: Certified Registered Nurse Anesthetist

## 2022-08-14 ENCOUNTER — Encounter: Payer: Self-pay | Admitting: Gastroenterology

## 2022-08-14 ENCOUNTER — Encounter
Admission: RE | Disposition: A | Discharge status: Home / Self Care | Payer: Self-pay | Source: Home / Self Care | Attending: Gastroenterology

## 2022-08-14 ENCOUNTER — Other Ambulatory Visit: Payer: Self-pay

## 2022-08-14 ENCOUNTER — Ambulatory Visit
Admission: RE | Admit: 2022-08-14 | Discharge: 2022-08-14 | Disposition: A | Discharge status: Home / Self Care | Payer: Medicare Other | Attending: Gastroenterology | Admitting: Gastroenterology

## 2022-08-14 DIAGNOSIS — Z09 Encounter for follow-up examination after completed treatment for conditions other than malignant neoplasm: Secondary | ICD-10-CM | POA: Insufficient documentation

## 2022-08-14 DIAGNOSIS — I5030 Unspecified diastolic (congestive) heart failure: Secondary | ICD-10-CM | POA: Diagnosis not present

## 2022-08-14 DIAGNOSIS — N183 Chronic kidney disease, stage 3 unspecified: Secondary | ICD-10-CM | POA: Diagnosis not present

## 2022-08-14 DIAGNOSIS — K635 Polyp of colon: Secondary | ICD-10-CM | POA: Diagnosis not present

## 2022-08-14 DIAGNOSIS — D125 Benign neoplasm of sigmoid colon: Secondary | ICD-10-CM | POA: Diagnosis not present

## 2022-08-14 DIAGNOSIS — I130 Hypertensive heart and chronic kidney disease with heart failure and stage 1 through stage 4 chronic kidney disease, or unspecified chronic kidney disease: Secondary | ICD-10-CM | POA: Diagnosis not present

## 2022-08-14 DIAGNOSIS — Z87891 Personal history of nicotine dependence: Secondary | ICD-10-CM | POA: Insufficient documentation

## 2022-08-14 DIAGNOSIS — K219 Gastro-esophageal reflux disease without esophagitis: Secondary | ICD-10-CM | POA: Diagnosis not present

## 2022-08-14 DIAGNOSIS — K641 Second degree hemorrhoids: Secondary | ICD-10-CM | POA: Insufficient documentation

## 2022-08-14 DIAGNOSIS — G894 Chronic pain syndrome: Secondary | ICD-10-CM | POA: Insufficient documentation

## 2022-08-14 DIAGNOSIS — R1319 Other dysphagia: Secondary | ICD-10-CM

## 2022-08-14 DIAGNOSIS — I5032 Chronic diastolic (congestive) heart failure: Secondary | ICD-10-CM | POA: Diagnosis not present

## 2022-08-14 DIAGNOSIS — M109 Gout, unspecified: Secondary | ICD-10-CM | POA: Insufficient documentation

## 2022-08-14 DIAGNOSIS — J45909 Unspecified asthma, uncomplicated: Secondary | ICD-10-CM | POA: Diagnosis not present

## 2022-08-14 DIAGNOSIS — K449 Diaphragmatic hernia without obstruction or gangrene: Secondary | ICD-10-CM | POA: Diagnosis not present

## 2022-08-14 DIAGNOSIS — E039 Hypothyroidism, unspecified: Secondary | ICD-10-CM | POA: Insufficient documentation

## 2022-08-14 DIAGNOSIS — D509 Iron deficiency anemia, unspecified: Secondary | ICD-10-CM | POA: Diagnosis not present

## 2022-08-14 DIAGNOSIS — K31811 Angiodysplasia of stomach and duodenum with bleeding: Secondary | ICD-10-CM | POA: Diagnosis not present

## 2022-08-14 DIAGNOSIS — R1310 Dysphagia, unspecified: Secondary | ICD-10-CM | POA: Insufficient documentation

## 2022-08-14 DIAGNOSIS — R1013 Epigastric pain: Secondary | ICD-10-CM | POA: Diagnosis not present

## 2022-08-14 DIAGNOSIS — M069 Rheumatoid arthritis, unspecified: Secondary | ICD-10-CM | POA: Diagnosis not present

## 2022-08-14 DIAGNOSIS — D631 Anemia in chronic kidney disease: Secondary | ICD-10-CM | POA: Diagnosis not present

## 2022-08-14 DIAGNOSIS — N1832 Chronic kidney disease, stage 3b: Secondary | ICD-10-CM | POA: Diagnosis not present

## 2022-08-14 DIAGNOSIS — K31819 Angiodysplasia of stomach and duodenum without bleeding: Secondary | ICD-10-CM | POA: Diagnosis not present

## 2022-08-14 DIAGNOSIS — K649 Unspecified hemorrhoids: Secondary | ICD-10-CM | POA: Diagnosis not present

## 2022-08-14 HISTORY — PX: COLONOSCOPY WITH PROPOFOL: SHX5780

## 2022-08-14 HISTORY — PX: HOT HEMOSTASIS: SHX5433

## 2022-08-14 HISTORY — PX: POLYPECTOMY: SHX5525

## 2022-08-14 HISTORY — PX: ESOPHAGOGASTRODUODENOSCOPY (EGD) WITH PROPOFOL: SHX5813

## 2022-08-14 SURGERY — COLONOSCOPY WITH PROPOFOL
Anesthesia: General

## 2022-08-14 MED ORDER — EPHEDRINE SULFATE (PRESSORS) 50 MG/ML IJ SOLN
INTRAMUSCULAR | Status: DC | PRN
  Administered 2022-08-14 (×2): 10 mg via INTRAVENOUS

## 2022-08-14 MED ORDER — PROPOFOL 10 MG/ML IV BOLUS
INTRAVENOUS | Status: DC | PRN
  Administered 2022-08-14: 60 mg via INTRAVENOUS
  Administered 2022-08-14: 20 mg via INTRAVENOUS

## 2022-08-14 MED ORDER — GLYCOPYRROLATE 0.2 MG/ML IJ SOLN
INTRAMUSCULAR | Status: DC | PRN
  Administered 2022-08-14: .2 mg via INTRAVENOUS

## 2022-08-14 MED ORDER — SODIUM CHLORIDE 0.9 % IV SOLN: INTRAVENOUS | Status: DC

## 2022-08-14 MED ORDER — EPHEDRINE 5 MG/ML INJ
INTRAVENOUS | Status: AC
  Filled 2022-08-14: qty 5

## 2022-08-14 MED ORDER — LIDOCAINE HCL (CARDIAC) PF 100 MG/5ML IV SOSY
PREFILLED_SYRINGE | INTRAVENOUS | Status: DC | PRN
  Administered 2022-08-14: 50 mg via INTRAVENOUS

## 2022-08-14 MED ORDER — PROPOFOL 500 MG/50ML IV EMUL
INTRAVENOUS | Status: DC | PRN
  Administered 2022-08-14: 125 ug/kg/min via INTRAVENOUS

## 2022-08-14 NOTE — Op Note (Signed)
[facility] Gastroenterology Patient Name: Michele Moore Procedure Date: 08/14/2022 11:17 AM MRN: [id number] Account #: [id number] Date of Birth: August 16, 1950 Admit Type: Outpatient Age: 72 Room: [facility] ENDO ROOM 3 Gender: Female Note Status: Finalized Instrument Name: Upper Endoscope [id number] Procedure:             Upper GI endoscopy Indications:           Iron deficiency anemia Providers:             Midge Minium MD, MD Referring MD:          Midge Minium MD, MD (Referring MD), Duanne Limerick, MD                         (Referring MD) Medicines:             Propofol per Anesthesia Complications:         No immediate complications. Procedure:             Pre-Anesthesia Assessment:                        - Prior to the procedure, a History and Physical was                         performed, and patient medications and allergies were                         reviewed. The patient's tolerance of previous                         anesthesia was also reviewed. The risks and benefits                         of the procedure and the sedation options and risks                         were discussed with the patient. All questions were                         answered, and informed consent was obtained. Prior                         Anticoagulants: The patient has taken no anticoagulant                         or antiplatelet agents. ASA Grade Assessment: II - A                         patient with mild systemic disease. After reviewing                         the risks and benefits, the patient was deemed in                         satisfactory condition to undergo the procedure.                        After obtaining informed consent, the endoscope was  passed under direct vision. Throughout the procedure,                         the patient's blood pressure, pulse, and oxygen                         saturations were monitored continuously. The Endoscope                          was introduced through the mouth, and advanced to the                         second part of duodenum. The upper GI endoscopy was                         accomplished without difficulty. The patient tolerated                         the procedure well. Findings:      A small hiatal hernia was present.      Moderate gastric antral vascular ectasia with bleeding was present in       the gastric antrum. Coagulation for hemostasis using argon plasma at 2       liters/minute and 20 watts was successful.      The examined duodenum was normal. Impression:            - Small hiatal hernia.                        - Gastric antral vascular ectasia with bleeding.                         Treated with argon plasma coagulation (APC).                        - Normal examined duodenum.                        - No specimens collected. Recommendation:        - Discharge patient to home.                        - Resume previous diet.                        - Continue present medications.                        - Perform a colonoscopy today. Procedure Code(s):     --- Professional ---                        [phone], Esophagogastroduodenoscopy, flexible,                         transoral; with control of bleeding, any method Diagnosis Code(s):     --- Professional ---                        D50.9, Iron deficiency anemia, unspecified  K31.811, Angiodysplasia of stomach and duodenum with                         bleeding CPT copyright 2022 [employer]. All rights reserved. The codes documented in this report are preliminary and upon coder review may  be revised to meet current compliance requirements. Midge Minium MD, MD 08/14/2022 11:36:06 AM This report has been signed electronically. Number of Addenda: 0 Note Initiated On: 08/14/2022 11:17 AM Estimated Blood Loss:  Estimated blood loss: none.      [facility]

## 2022-08-14 NOTE — Transfer of Care (Signed)
Immediate Anesthesia Transfer of Care Note  Patient: Michele Moore  Procedure(s) Performed: COLONOSCOPY WITH PROPOFOL ESOPHAGOGASTRODUODENOSCOPY (EGD) WITH PROPOFOL  Patient Location: Endoscopy Unit  Anesthesia Type:General  Level of Consciousness: awake, alert , and oriented  Airway & Oxygen Therapy: Patient Spontanous Breathing  Post-op Assessment: Report given to RN and Post -op Vital signs reviewed and stable  Post vital signs: Reviewed and stable  Last Vitals:  Vitals Value Taken Time  BP    Temp    Pulse    Resp    SpO2      Last Pain:  Vitals:   08/14/22 1018  TempSrc: Temporal         Complications: No notable events documented.

## 2022-08-14 NOTE — Anesthesia Procedure Notes (Signed)
Procedure Name: MAC Date/Time: 08/14/2022 11:17 AM  Performed by: Hezzie Bump, CRNAPre-anesthesia Checklist: Patient identified, Emergency Drugs available, Suction available and Patient being monitored Patient Re-evaluated:Patient Re-evaluated prior to induction Oxygen Delivery Method: Nasal cannula Induction Type: IV induction Placement Confirmation: positive ETCO2

## 2022-08-14 NOTE — H&P (Signed)
Michele Minium, MD [facility] [address] [place], [place] [address] [phone] Fax : [phone]  Primary Care Physician:  Duanne Limerick, MD Primary Gastroenterologist:  Dr. Servando Snare  Pre-Procedure History & Physical: HPI:  Michele Moore is a 72 y.o. female is here for an endoscopy and colonoscopy.   Past Medical History:  Diagnosis Date   (HFpEF) heart failure with preserved ejection fraction (HCC) 01/13/2014   a.) TTE 01/13/2014: EF 45%, apical HK, mod LVH, mild MAC, mild LAE/RVE, mod MR/TR/PR; b.) TTE 10/01/2018: EF 55-60%, mild MVH, mild LAE, triv MR/TR, G1DD   Allergy    ANA positive    Anemia of chronic renal failure, stage 3 (moderate), unspecified whether stage 3a or 3b CKD (HCC)    Aortic atherosclerosis (HCC)    Asthma    Cataract    Chronic pain syndrome    a.) followed by [facility] Cherylann Ratel, MD)   Chronic, continuous use of opioids    CKD (chronic kidney disease), stage III (HCC)    Complication of anesthesia    a.) anesthesia awareness/premature emergence during colonoscopy   DDD (degenerative disc disease), lumbar    Dyspnea on exertion    Dysrhythmia    IRREGULAR HEART BEAT   Family history of adverse reaction to anesthesia    sister difficult to put to sleep   GERD (gastroesophageal reflux disease)    Glaucoma    Gout    H/O tooth extraction    all lower teeth 1/19   Hemorrhoid    History of hiatal hernia    History of kidney stones    Hypertension    Hypothyroidism    Implantable loop recorder present 08/22/2021   a.) s/p implantation of [employer] LINQ Reveal ILR; serial #: [id number]   Long term current use of immunosuppressive drug    a.) MTX + apremilast   Migraines    Mixed hyperlipidemia    Multiple gastric ulcers    Osteoarthritis    Psoriasis    Psoriatic arthritis (HCC)    a.) on apremilast   Rheumatoid arthritis (HCC)    a.) on MTX   SS-A antibody positive    Wears dentures    partial upper and lower    Past  Surgical History:  Procedure Laterality Date   ANKLE SURGERY Right    CARPAL TUNNEL RELEASE     x3   COLONOSCOPY  2000?   COLONOSCOPY WITH PROPOFOL N/A 10/22/2017   Procedure: COLONOSCOPY WITH PROPOFOL;  Surgeon: Michele Minium, MD;  Location: [facility];  Service: Endoscopy;  Laterality: N/A;   ESOPHAGOGASTRODUODENOSCOPY (EGD) WITH PROPOFOL N/A 10/22/2017   Procedure: ESOPHAGOGASTRODUODENOSCOPY (EGD) WITH PROPOFOL;  Surgeon: Michele Minium, MD;  Location: [facility];  Service: Endoscopy;  Laterality: N/A;   FUSION OF TALONAVICULAR JOINT Right 08/03/2015   Procedure: TAILOR NAVICULAR JOINT FUSION - RIGHT ;  Surgeon: Gwyneth Revels, DPM;  Location: [facility];  Service: Podiatry;  Laterality: Right;   JOINT REPLACEMENT Right    knee x 3 ,right x1 and left x2   LOOP RECORDER IMPLANT  08/22/2021   Procedure: LOOP RECORDER IMPLANT; Location: [facility]; Surgeon: Sherryl Manges, MD   POLYPECTOMY N/A 10/22/2017   Procedure: POLYPECTOMY;  Surgeon: Michele Minium, MD;  Location: [facility];  Service: Endoscopy;  Laterality: N/A;   ROTATOR CUFF REPAIR Right 1995   TONSILLECTOMY     TOTAL KNEE REVISION Right 01/20/2018   Procedure: POLYETHYLENE EXCHANGE;  Surgeon: Donato Heinz, MD;  Location:  [facility];  Service: Orthopedics;  Laterality: Right;   TOTAL KNEE REVISION Left 10/09/2021   Procedure: LEFT TOTAL KNEE REVISION WITH POLYETHYLENE EXCHANGE.;  Surgeon: Donato Heinz, MD;  Location: [facility];  Service: Orthopedics;  Laterality: Left;   TUBAL LIGATION     UPPER GI ENDOSCOPY  2000?    Prior to Admission medications   Medication Sig Start Date End Date Taking? Authorizing Provider  HYDROcodone-acetaminophen (NORCO) 7.5-325 MG tablet Take 1 tablet by mouth every 8 (eight) hours as needed for severe pain. Must last 30 days. 09/21/22 10/21/22 Yes Edward Jolly, MD  levothyroxine (SYNTHROID) 150 MCG tablet Take 1 tablet (150 mcg total) by mouth daily. 03/27/22  Yes Duanne Limerick, MD   losartan (COZAAR) 25 MG tablet Take 25 mg by mouth daily. 12/18/21  Yes [provider]  metoprolol tartrate (LOPRESSOR) 25 MG tablet Take 1 tablet (25 mg total) by mouth 2 (two) times daily. 01/24/22  Yes Duke Salvia, MD  acetaminophen (TYLENOL) 650 MG CR tablet Take 650 mg by mouth every 8 (eight) hours as needed for pain.    [provider]  albuterol (VENTOLIN HFA) 108 (90 Base) MCG/ACT inhaler Inhale 2 puffs into the lungs every 6 (six) hours as needed for wheezing or shortness of breath. 06/04/21   Phineas Semen, MD  allopurinol (ZYLOPRIM) 100 MG tablet Take 200 mg by mouth every morning.  08/06/17   [provider]  betamethasone dipropionate 0.05 % cream APPLY TO PSORIASIS ON HANDS TWICE DAILY ONLY. AVOID FACE, GROIN AND AXILLA 05/30/19   Deirdre Evener, MD  calcipotriene (DOVONOX) 0.005 % cream APPLY TOPICALLY TO PSORIASIS AREAS ON LEGS AND FEET ONCE OR TWICE DAILY 03/28/21   Deirdre Evener, MD  Cholecalciferol (VITAMIN D3) 2000 units TABS Take 2,000 Units by mouth daily.    [provider]  clobetasol ointment (TEMOVATE) 0.05 % Apply topically 2 (two) times daily. 05/28/21   [provider]  diclofenac sodium (VOLTAREN) 1 % GEL Apply 2 g topically 3 (three) times daily.  08/11/17   [provider]  FARXIGA 10 MG TABS tablet Take 10 mg by mouth every morning. 07/15/21   [provider]  furosemide (LASIX) 40 MG tablet Take 40 mg by mouth daily. 07/15/21   [provider]  gabapentin (NEURONTIN) 600 MG tablet Take 1 tablet (600 mg total) by mouth at bedtime. 07/17/22 01/13/23  Edward Jolly, MD  HYDROcodone-acetaminophen (NORCO) 7.5-325 MG tablet Take 1 tablet by mouth every 8 (eight) hours as needed for severe pain. Must last 30 days. 07/23/22 08/22/22  Edward Jolly, MD  HYDROcodone-acetaminophen (NORCO) 7.5-325 MG tablet Take 1 tablet by mouth every 8 (eight) hours as needed for severe pain. Must last 30 days. 08/22/22  09/21/22  Edward Jolly, MD  ketoconazole (NIZORAL) 2 % cream Apply topically daily as needed. 03/19/21   Duanne Limerick, MD  meclizine (ANTIVERT) 12.5 MG tablet TAKE 1 TABLET(12.5 MG) BY MOUTH THREE TIMES DAILY AS NEEDED FOR DIZZINESS 09/16/21   Duanne Limerick, MD  montelukast (SINGULAIR) 10 MG tablet TAKE 1 TABLET(10 MG) BY MOUTH AT BEDTIME 03/27/22   Duanne Limerick, MD  Multiple Vitamins-Calcium (ONE-A-DAY WOMENS FORMULA PO) Take 1 tablet by mouth daily.    [provider]  mupirocin ointment (BACTROBAN) 2 % Apply 1 application topically 2 (two) times daily. 12/08/19   Duanne Limerick, MD  nystatin (MYCOSTATIN) 100000 UNIT/ML suspension Take 5 mLs (500,000 Units total) by mouth 4 (four)  times daily. 08/04/22   Rickard Patience, MD  nystatin cream (MYCOSTATIN) Apply 1 Application topically 2 (two) times daily. 09/16/21   Duanne Limerick, MD  omega-3 acid ethyl esters (LOVAZA) 1 g capsule Take 1 capsule (1 g total) by mouth 2 (two) times daily. 06/17/22   Duanne Limerick, MD  omeprazole (PRILOSEC) 40 MG capsule TAKE 1 CAPSULE(40 MG) BY MOUTH DAILY 03/27/22   Duanne Limerick, MD  OTEZLA 30 MG TABS Take 1 tablet by mouth daily. 11/12/20   [provider]  Polyethyl Glycol-Propyl Glycol (SYSTANE OP) Place 1 drop into both eyes daily as needed (dry eyes).    [provider]  potassium chloride SA (KLOR-CON M) 20 MEQ tablet Take 1 tablet (20 mEq total) by mouth daily. 03/27/22   Duanne Limerick, MD  predniSONE (STERAPRED UNI-PAK 21 TAB) 10 MG (21) TBPK tablet Day 1 take 6 tablets, Day 2 take 5 tablets, Day 3 take 4 tablets, Day 4 take 3 tablets, Day 5 take 2 tablets D6 take 1 tablet 08/04/22   Rickard Patience, MD  tiZANidine (ZANAFLEX) 4 MG tablet Take 1 tablet (4 mg total) by mouth at bedtime. 07/17/22 07/12/23  Edward Jolly, MD  triamcinolone (KENALOG) 0.1 % Apply 1 application topically 2 (two) times daily. 03/22/20   Duanne Limerick, MD  fluticasone (FLONASE) 50 MCG/ACT nasal spray SHAKE LIQUID AND  USE 1 SPRAY IN [facility] NOSTRIL DAILY 02/28/19 04/24/19  Duanne Limerick, MD  mometasone (NASONEX) 50 MCG/ACT nasal spray Place 2 sprays into the nose daily. 04/12/19 04/24/19  Duanne Limerick, MD    Allergies as of 07/09/2022 - Review Complete 07/08/2022  Allergen Reaction Noted   Ampicillin Swelling 09/04/2016   Penicillins Anaphylaxis and Swelling 04/19/2014   Vancomycin Rash 03/06/2019   Vibramycin [doxycycline calcium] Rash 04/19/2014    Family History  Problem Relation Age of Onset   Heart failure Mother    Gout Mother    Arthritis Mother    Hypertension Mother    Stroke Mother    Heart failure Father    Diabetes Father    Hyperlipidemia Father    COPD Sister    Cancer Maternal Aunt        breast   Cancer Maternal Uncle        kidney   Breast cancer Neg Hx     Social History   Socioeconomic History   Marital status: Divorced    Spouse name: Not on file   Number of children: 4   Years of education: Not on file   Highest education level: 9th grade  Occupational History   Occupation: retired  Tobacco Use   Smoking status: Former    Packs/day: 2.00    Years: 20.00    Additional pack years: 0.00    Total pack years: 40.00    Types: Cigarettes    Quit date: 02/24/1993    Years since quitting: 29.4   Smokeless tobacco: Never  Vaping Use   Vaping Use: Never used  Substance and Sexual Activity   Alcohol use: No    Alcohol/week: 0.0 standard drinks of alcohol   Drug use: No   Sexual activity: Not Currently  Other Topics Concern   Not on file  Social History Narrative   Pt lives alone   Social Determinants of Health   Financial Resource Strain: Low Risk  (03/24/2022)   Overall Financial Resource Strain (CARDIA)    Difficulty of Paying Living Expenses: Not hard at all  Food Insecurity: No Food Insecurity (03/24/2022)   Hunger Vital Sign    Worried About Running Out of Food in the Last Year: Never true    Ran Out of Food in the Last Year: Never true  Transportation  Needs: No Transportation Needs (03/24/2022)   PRAPARE - Administrator, Civil Service (Medical): No    Lack of Transportation (Non-Medical): No  Physical Activity: Inactive (03/24/2022)   Exercise Vital Sign    Days of Exercise per Week: 0 days    Minutes of Exercise per Session: 0 min  Stress: No Stress Concern Present (03/24/2022)   [employer] of Occupational Health - Occupational Stress Questionnaire    Feeling of Stress : Not at all  Social Connections: Moderately Integrated (03/24/2022)   Social Connection and Isolation Panel [NHANES]    Frequency of Communication with Friends and Family: More than three times a week    Frequency of Social Gatherings with Friends and Family: Twice a week    Attends Religious Services: More than 4 times per year    Active Member of [employer] or Organizations: Yes    Attends Engineer, structural: More than 4 times per year    Marital Status: Divorced  Intimate Partner Violence: Not At Risk (03/24/2022)   Humiliation, Afraid, Rape, and Kick questionnaire    Fear of Current or Ex-Partner: No    Emotionally Abused: No    Physically Abused: No    Sexually Abused: No    Review of Systems: See HPI, otherwise negative ROS  Physical Exam: BP (!) 150/68   Pulse (!) 58   Temp (!) 96.9 F (36.1 C) (Temporal)   Resp 16   Ht 5\' 11"  (1.803 m)   Wt 109.3 kg   SpO2 100%   BMI 33.61 kg/m  General:   Alert,  pleasant and cooperative in NAD Head:  Normocephalic and atraumatic. Neck:  Supple; no masses or thyromegaly. Lungs:  Clear throughout to auscultation.    Heart:  Regular rate and rhythm. Abdomen:  Soft, nontender and nondistended. Normal bowel sounds, without guarding, and without rebound.   Neurologic:  Alert and  oriented x4;  grossly normal neurologically.  Impression/Plan: AVEN PURINTON is here for an endoscopy and colonoscopy to be performed for dysphagia and IDA  Risks, benefits, limitations, and alternatives regarding   endoscopy and colonoscopy have been reviewed with the patient.  Questions have been answered.  All parties agreeable.   Michele Minium, MD  08/14/2022, 10:46 AM

## 2022-08-14 NOTE — Anesthesia Postprocedure Evaluation (Signed)
Anesthesia Post Note  Patient: Michele Moore  Procedure(s) Performed: COLONOSCOPY WITH PROPOFOL ESOPHAGOGASTRODUODENOSCOPY (EGD) WITH PROPOFOL  Patient location during evaluation: Endoscopy Anesthesia Type: General Level of consciousness: awake and alert Pain management: pain level controlled Vital Signs Assessment: post-procedure vital signs reviewed and stable Respiratory status: spontaneous breathing, nonlabored ventilation, respiratory function stable and patient connected to nasal cannula oxygen Cardiovascular status: blood pressure returned to baseline and stable Postop Assessment: no apparent nausea or vomiting Anesthetic complications: no  No notable events documented.   Last Vitals:  Vitals:   08/14/22 1018 08/14/22 1154  BP: (!) 150/68 130/79  Pulse: (!) 58 78  Resp: 16 19  Temp: (!) 36.1 C (!) 35.4 C  SpO2: 100% 99%    Last Pain:  Vitals:   08/14/22 1154  TempSrc: Temporal  PainSc: 0-No pain                 Stephanie Coup

## 2022-08-14 NOTE — Anesthesia Preprocedure Evaluation (Signed)
Anesthesia Evaluation  Patient identified by MRN, date of birth, ID band Patient awake    Reviewed: Allergy & Precautions, NPO status , Patient's Chart, lab work & pertinent test results  History of Anesthesia Complications Negative for: history of anesthetic complications  Airway Mallampati: IV   Neck ROM: Full    Dental  (+) Missing, Upper Dentures, Lower Dentures   Pulmonary asthma , former smoker   Pulmonary exam normal breath sounds clear to auscultation       Cardiovascular hypertension, +CHF (preserved EF) and + DOE  Normal cardiovascular exam Rhythm:Regular Rate:Normal  ECG 08/22/21: Sinus rhythm with PACs and occasional PVCs  Echo 10/01/18:  1. The left ventricle has normal systolic function, with an ejection fraction of 55-60%. The cavity size was normal. There is mild concentric left ventricular hypertrophy. Left ventricular diastolic Doppler parameters are consistent with impaired relaxation.  2. The right ventricle has normal systolic function. The cavity was normal. There is no increase in right ventricular wall thickness. Right ventricular systolic pressure could not be assessed.  3. Left atrial size was mildly dilated.  4. The mitral valve is grossly normal.  5. The tricuspid valve is grossly normal.  6. The aortic valve is grossly normal. No stenosis of the aortic valve.  7. The aorta is normal in size and structure.  8. PVCs noted during the study.  Myocardial perfusion 10/01/18:  1. Normal left ventricular systolic function with an EF of 55-65% 2. No evidence of stress-induced myocardial ischemia or arrhythmia; no scintigraphic evidence of scar 3. Normal low risk study   Neuro/Psych  Headaches Chronic pain  Neuromuscular disease (neuropathy with right foot numbness)    GI/Hepatic hiatal hernia, PUD,GERD  ,,  Endo/Other  Hypothyroidism    Renal/GU Renal disease (stage III CKD)     Musculoskeletal  (+)  Arthritis , Osteoarthritis and Rheumatoid disorders,  Gout    Abdominal   Peds  Hematology  (+) Blood dyscrasia, anemia   Anesthesia Other Findings Reviewed and agree with Michele Moore pre-anesthesia clinical review note.   Cardiology note 10/07/21:  Preoperative Cardiovascular Risk Assessment: Patient has an upcoming left total knee revision with polyethylene exchange planned. She is stable from a cardiac standpoint with no angina, acute CHF symptoms, significant palpitations, lightheadedness/dizziness, or syncope. She is able to complete >4.0 METS without any chest pain or shortness of breath. Per Revised Cardiac Risk Index, considered moderate risk with 6.6% chance of MI, pulmonary edema, ventricular fibrillation, cardiac arrest, or complete heart block.  Therefore, based on ACC/AHA guidelines, patient would be at acceptable risk for the planned procedure without further cardiovascular testing. I will route this recommendation to the requesting party via Epic fax function.    Reproductive/Obstetrics                             Anesthesia Physical Anesthesia Plan  ASA: 3  Anesthesia Plan: General and Spinal   Post-op Pain Management: Minimal or no pain anticipated   Induction: Intravenous  PONV Risk Score and Plan: 3 and Propofol infusion, TIVA, Treatment may vary due to age or medical condition and Ondansetron  Airway Management Planned: Natural Airway and Nasal Cannula  Additional Equipment: None  Intra-op Plan:   Post-operative Plan:   Informed Consent: I have reviewed the patients History and Physical, chart, labs and discussed the procedure including the risks, benefits and alternatives for the proposed anesthesia with the patient or authorized representative who has  indicated his/her understanding and acceptance.     Dental advisory given  Plan Discussed with: CRNA  Anesthesia Plan Comments: (Discussed risks of anesthesia with patient,  including possibility of difficulty with spontaneous ventilation under anesthesia necessitating airway intervention, PONV, and rare risks such as cardiac or respiratory or neurological events, and allergic reactions. Discussed the role of CRNA in patient's perioperative care. Patient understands.)        Anesthesia Quick Evaluation

## 2022-08-14 NOTE — Op Note (Signed)
[facility] Gastroenterology Patient Name: Michele Moore Procedure Date: 08/14/2022 11:15 AM MRN: [id number] Account #: [id number] Date of Birth: 28-Feb-1950 Admit Type: Outpatient Age: 72 Room: [facility] ENDO ROOM 3 Gender: Female Note Status: Finalized Instrument Name: [name] [id number] Procedure:             Colonoscopy Indications:           Iron deficiency anemia Providers:             Midge Minium MD, MD Referring MD:          Midge Minium MD, MD (Referring MD), Duanne Limerick, MD                         (Referring MD) Medicines:             Propofol per Anesthesia Complications:         No immediate complications. Procedure:             Pre-Anesthesia Assessment:                        - Prior to the procedure, a History and Physical was                         performed, and patient medications and allergies were                         reviewed. The patient's tolerance of previous                         anesthesia was also reviewed. The risks and benefits                         of the procedure and the sedation options and risks                         were discussed with the patient. All questions were                         answered, and informed consent was obtained. Prior                         Anticoagulants: The patient has taken no anticoagulant                         or antiplatelet agents. ASA Grade Assessment: II - A                         patient with mild systemic disease. After reviewing                         the risks and benefits, the patient was deemed in                         satisfactory condition to undergo the procedure.                        After obtaining informed consent, the colonoscope was  passed under direct vision. Throughout the procedure,                         the patient's blood pressure, pulse, and oxygen                         saturations were monitored continuously. The                          Colonoscope was introduced through the anus and                         advanced to the the cecum, identified by appendiceal                         orifice and ileocecal valve. The colonoscopy was                         performed without difficulty. The patient tolerated                         the procedure well. The quality of the bowel                         preparation was good. Findings:      The perianal and digital rectal examinations were normal.      Three sessile polyps were found in the sigmoid colon. The polyps were 3       to 4 mm in size. These polyps were removed with a cold snare. Resection       and retrieval were complete.      Non-bleeding internal hemorrhoids were found during retroflexion. The       hemorrhoids were Grade II (internal hemorrhoids that prolapse but reduce       spontaneously). Impression:            - Three 3 to 4 mm polyps in the sigmoid colon, removed                         with a cold snare. Resected and retrieved.                        - Non-bleeding internal hemorrhoids. Recommendation:        - Discharge patient to home.                        - Resume previous diet.                        - Continue present medications.                        - Await pathology results.                        - Repeat colonoscopy is not recommended for                         surveillance. Procedure Code(s):     --- Professional ---  40981, Colonoscopy, flexible; with removal of                         tumor(s), polyp(s), or other lesion(s) by snare                         technique Diagnosis Code(s):     --- Professional ---                        D50.9, Iron deficiency anemia, unspecified                        D12.5, Benign neoplasm of sigmoid colon CPT copyright 2022 American Medical Association. All rights reserved. The codes documented in this report are preliminary and upon coder review may  be revised to meet current  compliance requirements. Midge Minium MD, MD 08/14/2022 11:53:12 AM This report has been signed electronically. Number of Addenda: 0 Note Initiated On: 08/14/2022 11:15 AM Scope Withdrawal Time: 0 hours 6 minutes 17 seconds  Total Procedure Duration: 0 hours 14 minutes 10 seconds  Estimated Blood Loss:  Estimated blood loss: none.      [facility]

## 2022-08-15 ENCOUNTER — Encounter: Payer: Self-pay | Admitting: Gastroenterology

## 2022-08-18 ENCOUNTER — Other Ambulatory Visit: Payer: Medicare Other

## 2022-08-18 ENCOUNTER — Ambulatory Visit: Payer: Medicare Other | Admitting: Gastroenterology

## 2022-08-18 ENCOUNTER — Ambulatory Visit: Payer: Medicare Other

## 2022-08-19 ENCOUNTER — Ambulatory Visit: Payer: Medicare Other

## 2022-08-19 ENCOUNTER — Other Ambulatory Visit: Payer: Self-pay

## 2022-08-19 ENCOUNTER — Inpatient Hospital Stay: Payer: Medicare Other

## 2022-08-19 VITALS — BP 141/69

## 2022-08-19 DIAGNOSIS — N183 Chronic kidney disease, stage 3 unspecified: Secondary | ICD-10-CM

## 2022-08-19 DIAGNOSIS — R21 Rash and other nonspecific skin eruption: Secondary | ICD-10-CM | POA: Diagnosis not present

## 2022-08-19 DIAGNOSIS — Z87891 Personal history of nicotine dependence: Secondary | ICD-10-CM | POA: Diagnosis not present

## 2022-08-19 DIAGNOSIS — N1832 Chronic kidney disease, stage 3b: Secondary | ICD-10-CM | POA: Diagnosis not present

## 2022-08-19 DIAGNOSIS — D649 Anemia, unspecified: Secondary | ICD-10-CM

## 2022-08-19 DIAGNOSIS — Z8051 Family history of malignant neoplasm of kidney: Secondary | ICD-10-CM | POA: Diagnosis not present

## 2022-08-19 DIAGNOSIS — B370 Candidal stomatitis: Secondary | ICD-10-CM | POA: Diagnosis not present

## 2022-08-19 DIAGNOSIS — D631 Anemia in chronic kidney disease: Secondary | ICD-10-CM | POA: Diagnosis not present

## 2022-08-19 LAB — CBC WITH DIFFERENTIAL (CANCER CENTER ONLY)
Abs Immature Granulocytes: 0.01 10*3/uL (ref 0.00–0.07)
Basophils Absolute: 0 10*3/uL (ref 0.0–0.1)
Basophils Relative: 1 %
Eosinophils Absolute: 0.2 10*3/uL (ref 0.0–0.5)
Eosinophils Relative: 3 %
HCT: 27.9 % — ABNORMAL LOW (ref 36.0–46.0)
Hemoglobin: 8.4 g/dL — ABNORMAL LOW (ref 12.0–15.0)
Immature Granulocytes: 0 %
Lymphocytes Relative: 17 %
Lymphs Abs: 1.1 10*3/uL (ref 0.7–4.0)
MCH: 26.8 pg (ref 26.0–34.0)
MCHC: 30.1 g/dL (ref 30.0–36.0)
MCV: 88.9 fL (ref 80.0–100.0)
Monocytes Absolute: 0.3 10*3/uL (ref 0.1–1.0)
Monocytes Relative: 5 %
Neutro Abs: 4.8 10*3/uL (ref 1.7–7.7)
Neutrophils Relative %: 74 %
Platelet Count: 213 10*3/uL (ref 150–400)
RBC: 3.14 MIL/uL — ABNORMAL LOW (ref 3.87–5.11)
RDW: 18 % — ABNORMAL HIGH (ref 11.5–15.5)
WBC Count: 6.5 10*3/uL (ref 4.0–10.5)
nRBC: 0 % (ref 0.0–0.2)

## 2022-08-19 LAB — SAMPLE TO BLOOD BANK

## 2022-08-19 MED ORDER — EPOETIN ALFA-EPBX 40000 UNIT/ML IJ SOLN
40000.0000 [IU] | Freq: Once | INTRAMUSCULAR | Status: AC
  Administered 2022-08-19: 40000 [IU] via SUBCUTANEOUS
  Filled 2022-08-19: qty 1

## 2022-08-19 MED FILL — Iron Sucrose Inj 20 MG/ML (Fe Equiv): INTRAVENOUS | Qty: 10 | Status: AC

## 2022-08-20 ENCOUNTER — Inpatient Hospital Stay: Payer: Medicare Other

## 2022-08-20 VITALS — BP 112/51 | HR 69 | Temp 96.8°F | Resp 18

## 2022-08-20 DIAGNOSIS — Z8051 Family history of malignant neoplasm of kidney: Secondary | ICD-10-CM | POA: Diagnosis not present

## 2022-08-20 DIAGNOSIS — D649 Anemia, unspecified: Secondary | ICD-10-CM

## 2022-08-20 DIAGNOSIS — D631 Anemia in chronic kidney disease: Secondary | ICD-10-CM | POA: Diagnosis not present

## 2022-08-20 DIAGNOSIS — R21 Rash and other nonspecific skin eruption: Secondary | ICD-10-CM | POA: Diagnosis not present

## 2022-08-20 DIAGNOSIS — Z87891 Personal history of nicotine dependence: Secondary | ICD-10-CM | POA: Diagnosis not present

## 2022-08-20 DIAGNOSIS — B370 Candidal stomatitis: Secondary | ICD-10-CM | POA: Diagnosis not present

## 2022-08-20 DIAGNOSIS — N1832 Chronic kidney disease, stage 3b: Secondary | ICD-10-CM | POA: Diagnosis not present

## 2022-08-20 DIAGNOSIS — N183 Chronic kidney disease, stage 3 unspecified: Secondary | ICD-10-CM

## 2022-08-20 MED ORDER — SODIUM CHLORIDE 0.9 % IV SOLN
200.0000 mg | Freq: Once | INTRAVENOUS | Status: AC
  Administered 2022-08-20: 200 mg via INTRAVENOUS
  Filled 2022-08-20: qty 10

## 2022-08-20 MED ORDER — SODIUM CHLORIDE 0.9 % IV SOLN
INTRAVENOUS | Status: DC
  Filled 2022-08-20: qty 250

## 2022-08-21 DIAGNOSIS — M069 Rheumatoid arthritis, unspecified: Secondary | ICD-10-CM | POA: Diagnosis not present

## 2022-08-21 DIAGNOSIS — J45909 Unspecified asthma, uncomplicated: Secondary | ICD-10-CM | POA: Diagnosis not present

## 2022-08-21 DIAGNOSIS — I10 Essential (primary) hypertension: Secondary | ICD-10-CM | POA: Diagnosis not present

## 2022-08-21 DIAGNOSIS — E782 Mixed hyperlipidemia: Secondary | ICD-10-CM | POA: Diagnosis not present

## 2022-08-21 DIAGNOSIS — R001 Bradycardia, unspecified: Secondary | ICD-10-CM | POA: Diagnosis not present

## 2022-08-21 DIAGNOSIS — E669 Obesity, unspecified: Secondary | ICD-10-CM | POA: Diagnosis not present

## 2022-08-21 DIAGNOSIS — K219 Gastro-esophageal reflux disease without esophagitis: Secondary | ICD-10-CM | POA: Diagnosis not present

## 2022-08-21 DIAGNOSIS — R0609 Other forms of dyspnea: Secondary | ICD-10-CM | POA: Diagnosis not present

## 2022-08-22 ENCOUNTER — Telehealth: Payer: Self-pay

## 2022-08-22 ENCOUNTER — Other Ambulatory Visit: Payer: Self-pay

## 2022-08-22 DIAGNOSIS — N183 Chronic kidney disease, stage 3 unspecified: Secondary | ICD-10-CM

## 2022-08-22 NOTE — Telephone Encounter (Signed)
Pathology report scanned

## 2022-08-23 ENCOUNTER — Encounter: Payer: Self-pay | Admitting: Gastroenterology

## 2022-08-25 ENCOUNTER — Inpatient Hospital Stay: Payer: Medicare Other | Attending: Oncology

## 2022-08-25 ENCOUNTER — Inpatient Hospital Stay: Payer: Medicare Other

## 2022-08-25 VITALS — BP 129/59

## 2022-08-25 DIAGNOSIS — N1832 Chronic kidney disease, stage 3b: Secondary | ICD-10-CM | POA: Diagnosis not present

## 2022-08-25 DIAGNOSIS — N183 Chronic kidney disease, stage 3 unspecified: Secondary | ICD-10-CM

## 2022-08-25 DIAGNOSIS — D631 Anemia in chronic kidney disease: Secondary | ICD-10-CM | POA: Insufficient documentation

## 2022-08-25 DIAGNOSIS — D649 Anemia, unspecified: Secondary | ICD-10-CM

## 2022-08-25 LAB — CBC (CANCER CENTER ONLY)
HCT: 27.8 % — ABNORMAL LOW (ref 36.0–46.0)
Hemoglobin: 8.3 g/dL — ABNORMAL LOW (ref 12.0–15.0)
MCH: 26.4 pg (ref 26.0–34.0)
MCHC: 29.9 g/dL — ABNORMAL LOW (ref 30.0–36.0)
MCV: 88.5 fL (ref 80.0–100.0)
Platelet Count: 214 10*3/uL (ref 150–400)
RBC: 3.14 MIL/uL — ABNORMAL LOW (ref 3.87–5.11)
RDW: 18.2 % — ABNORMAL HIGH (ref 11.5–15.5)
WBC Count: 5.5 10*3/uL (ref 4.0–10.5)
nRBC: 0 % (ref 0.0–0.2)

## 2022-08-25 LAB — SAMPLE TO BLOOD BANK

## 2022-08-25 MED ORDER — EPOETIN ALFA-EPBX 40000 UNIT/ML IJ SOLN
40000.0000 [IU] | Freq: Once | INTRAMUSCULAR | Status: AC
  Administered 2022-08-25: 40000 [IU] via SUBCUTANEOUS
  Filled 2022-08-25: qty 1

## 2022-08-26 ENCOUNTER — Inpatient Hospital Stay: Payer: Medicare Other

## 2022-08-27 ENCOUNTER — Other Ambulatory Visit: Payer: Medicare Other

## 2022-08-27 NOTE — Progress Notes (Signed)
Carelink Summary Report / Loop Recorder 

## 2022-09-01 DIAGNOSIS — R768 Other specified abnormal immunological findings in serum: Secondary | ICD-10-CM | POA: Diagnosis not present

## 2022-09-01 DIAGNOSIS — L409 Psoriasis, unspecified: Secondary | ICD-10-CM | POA: Diagnosis not present

## 2022-09-01 DIAGNOSIS — L4050 Arthropathic psoriasis, unspecified: Secondary | ICD-10-CM | POA: Diagnosis not present

## 2022-09-01 DIAGNOSIS — M1A09X0 Idiopathic chronic gout, multiple sites, without tophus (tophi): Secondary | ICD-10-CM | POA: Diagnosis not present

## 2022-09-01 DIAGNOSIS — M159 Polyosteoarthritis, unspecified: Secondary | ICD-10-CM | POA: Diagnosis not present

## 2022-09-02 ENCOUNTER — Inpatient Hospital Stay: Payer: Medicare Other

## 2022-09-02 DIAGNOSIS — N1832 Chronic kidney disease, stage 3b: Secondary | ICD-10-CM | POA: Diagnosis not present

## 2022-09-02 DIAGNOSIS — D631 Anemia in chronic kidney disease: Secondary | ICD-10-CM

## 2022-09-02 DIAGNOSIS — N183 Chronic kidney disease, stage 3 unspecified: Secondary | ICD-10-CM

## 2022-09-02 LAB — CBC WITH DIFFERENTIAL (CANCER CENTER ONLY)
Abs Immature Granulocytes: 0.02 10*3/uL (ref 0.00–0.07)
Basophils Absolute: 0 10*3/uL (ref 0.0–0.1)
Basophils Relative: 1 %
Eosinophils Absolute: 0.3 10*3/uL (ref 0.0–0.5)
Eosinophils Relative: 5 %
HCT: 30.8 % — ABNORMAL LOW (ref 36.0–46.0)
Hemoglobin: 9.2 g/dL — ABNORMAL LOW (ref 12.0–15.0)
Immature Granulocytes: 0 %
Lymphocytes Relative: 20 %
Lymphs Abs: 1.1 10*3/uL (ref 0.7–4.0)
MCH: 25.7 pg — ABNORMAL LOW (ref 26.0–34.0)
MCHC: 29.9 g/dL — ABNORMAL LOW (ref 30.0–36.0)
MCV: 86 fL (ref 80.0–100.0)
Monocytes Absolute: 0.4 10*3/uL (ref 0.1–1.0)
Monocytes Relative: 8 %
Neutro Abs: 3.8 10*3/uL (ref 1.7–7.7)
Neutrophils Relative %: 66 %
Platelet Count: 254 10*3/uL (ref 150–400)
RBC: 3.58 MIL/uL — ABNORMAL LOW (ref 3.87–5.11)
RDW: 17.7 % — ABNORMAL HIGH (ref 11.5–15.5)
WBC Count: 5.8 10*3/uL (ref 4.0–10.5)
nRBC: 0 % (ref 0.0–0.2)

## 2022-09-02 LAB — SAMPLE TO BLOOD BANK

## 2022-09-02 LAB — IRON AND TIBC
Iron: 68 ug/dL (ref 28–170)
Saturation Ratios: 24 % (ref 10.4–31.8)
TIBC: 290 ug/dL (ref 250–450)
UIBC: 222 ug/dL

## 2022-09-02 LAB — FERRITIN: Ferritin: 80 ng/mL (ref 11–307)

## 2022-09-04 ENCOUNTER — Inpatient Hospital Stay: Payer: Medicare Other

## 2022-09-04 ENCOUNTER — Inpatient Hospital Stay (HOSPITAL_BASED_OUTPATIENT_CLINIC_OR_DEPARTMENT_OTHER): Payer: Medicare Other | Admitting: Oncology

## 2022-09-04 ENCOUNTER — Encounter: Payer: Self-pay | Admitting: Oncology

## 2022-09-04 VITALS — BP 115/48 | HR 59 | Temp 97.7°F | Resp 18 | Wt 239.3 lb

## 2022-09-04 DIAGNOSIS — N183 Chronic kidney disease, stage 3 unspecified: Secondary | ICD-10-CM

## 2022-09-04 DIAGNOSIS — D631 Anemia in chronic kidney disease: Secondary | ICD-10-CM

## 2022-09-04 DIAGNOSIS — D649 Anemia, unspecified: Secondary | ICD-10-CM

## 2022-09-04 DIAGNOSIS — N1832 Chronic kidney disease, stage 3b: Secondary | ICD-10-CM

## 2022-09-04 MED ORDER — SODIUM CHLORIDE 0.9 % IV SOLN
200.0000 mg | Freq: Once | INTRAVENOUS | Status: AC
  Administered 2022-09-04: 200 mg via INTRAVENOUS
  Filled 2022-09-04: qty 200

## 2022-09-04 MED ORDER — SODIUM CHLORIDE 0.9 % IV SOLN
INTRAVENOUS | Status: DC
  Filled 2022-09-04: qty 250

## 2022-09-04 NOTE — Progress Notes (Signed)
Pt here for follow up. Pt reports left ear is stopped up.

## 2022-09-04 NOTE — Assessment & Plan Note (Addendum)
Labs reviewed and discussed with patient.  Hemoglobin remains low despite Venofer and EPO.   Lab Results  Component Value Date   HGB 9.2 (L) 09/02/2022   TIBC 290 09/02/2022   IRONPCTSAT 24 09/02/2022   FERRITIN 80 09/02/2022   Ferritin <200, recommend Venofer treatments to further increase iron level. Recommend IV Venofer weekly x 4. After that we will monitor H&H every 4 weeks, proceed with Retacrit if hemoglobin less than 10.

## 2022-09-04 NOTE — Assessment & Plan Note (Signed)
Encourage oral hydration and avoid nephrotoxins.   

## 2022-09-04 NOTE — Patient Instructions (Signed)

## 2022-09-04 NOTE — Progress Notes (Signed)
Hematology/Oncology Progress note [phone] [phone]       Clinic Day:  09/04/2022  ASSESSMENT & PLAN:   Anemia due to stage 3b chronic kidney disease (HCC) Labs reviewed and discussed with patient.  Hemoglobin remains low despite Venofer and EPO.   Lab Results  Component Value Date   HGB 9.2 (L) 09/02/2022   TIBC 290 09/02/2022   IRONPCTSAT 24 09/02/2022   FERRITIN 80 09/02/2022   Ferritin <200, recommend Venofer treatments to further increase iron level. Recommend IV Venofer weekly x 4. After that we will monitor H&H every 4 weeks, proceed with Retacrit if hemoglobin less than 10.   CKD (chronic kidney disease), stage III (HCC) Encourage oral hydration and avoid nephrotoxins.     Orders Placed This Encounter  Procedures   Hemoglobin and Hematocrit, Blood    Standing Status:   Future    Standing Expiration Date:   09/04/2023   Hemoglobin and Hematocrit, Blood    Standing Status:   Future    Standing Expiration Date:   09/04/2023   Iron and TIBC    Standing Status:   Future    Standing Expiration Date:   09/04/2023   Ferritin    Standing Status:   Future    Standing Expiration Date:   09/04/2023   Retic Panel    Standing Status:   Future    Standing Expiration Date:   09/04/2023   All questions were answered. The patient knows to call the clinic with any problems, questions or concerns.  Rickard Patience, MD, PhD [facility] Hematology Oncology 09/04/2022    Chief Complaint: Michele Moore is a 72 y.o. female presents for anemia in stage IIIb chronic kidney disease   PERTINENT ONCOLOGY HISTORY Michele Moore is a 72 y.o.afemale who has above oncology history reviewed by me today presented for follow up visit for management of anemia in CKD  PERTINENT HEMATOLOGY HISTORY Patient previously followed up by Dr.Corcoran, patient switched care to me on 08/30/20 Extensive medical record review was performed by me  # anemia in stage IIIb chronic kidney  disease.  Work-up on 04/23/2020 revealed a hematocrit of 35.0, hemoglobin 11.4, MCV 92.3, platelets 168,000, WBC 4,600 with an ANC of 3000. Ferritin was 107 with an iron saturation of 37% and a TIBC of 315. Sed rate was 54 and CRP was 0.9.  Vitamin B12 was 429 and folate >100.0.   10/22/2017 Colonoscopy for heme positive stool revealed one 7 mm polyp in the ascending colon, one 3 mm polyp in the transverse colon, and three 1 to 4 mm polyps in the sigmoid colon. There were non-bleeding internal hemorrhoids. There were petechia(e) in the rectum. Pathology showed two tubular adenomas and one hyperplastic polyp. EGD on 10/22/2017 showed gastritis and one gastric polyp.  She has Sjogren's syndrome, rheumatoid arthritis, hypertension, and proteinuria.  She takes oral iron once a day.  Patient follows up with pain clinic for pain management.  She previously tolerated IV Venofer treatments.  INTERVAL HISTORY Michele Moore is a 72 y.o. female who has above history reviewed by me today presents for follow up visit for anemia secondary to chronic kidney disease. Fatigue has improved. During interval, patient underwent EGD and colonoscopy. Findings included 3 small polyps in the colon, nonbleeding hemorrhoids.  Gastric antral vascular ectasia with bleeding, treated with APC.  Small hiatal hernia.    Past Medical History:  Diagnosis Date   (HFpEF) heart failure with preserved ejection fraction (HCC) 01/13/2014  a.) TTE 01/13/2014: EF 45%, apical HK, mod LVH, mild MAC, mild LAE/RVE, mod MR/TR/PR; b.) TTE 10/01/2018: EF 55-60%, mild MVH, mild LAE, triv MR/TR, G1DD   Allergy    ANA positive    Anemia of chronic renal failure, stage 3 (moderate), unspecified whether stage 3a or 3b CKD (HCC)    Aortic atherosclerosis (HCC)    Asthma    Cataract    Chronic pain syndrome    a.) followed by [facility] Cherylann Ratel, MD)   Chronic, continuous use of opioids    CKD (chronic kidney disease), stage III  (HCC)    Complication of anesthesia    a.) anesthesia awareness/premature emergence during colonoscopy   DDD (degenerative disc disease), lumbar    Dyspnea on exertion    Dysrhythmia    IRREGULAR HEART BEAT   Family history of adverse reaction to anesthesia    sister difficult to put to sleep   GERD (gastroesophageal reflux disease)    Glaucoma    Gout    H/O tooth extraction    all lower teeth 1/19   Hemorrhoid    History of hiatal hernia    History of kidney stones    Hypertension    Hypothyroidism    Implantable loop recorder present 08/22/2021   a.) s/p implantation of [employer] LINQ Reveal ILR; serial #: [id number]   Long term current use of immunosuppressive drug    a.) MTX + apremilast   Migraines    Mixed hyperlipidemia    Multiple gastric ulcers    Osteoarthritis    Psoriasis    Psoriatic arthritis (HCC)    a.) on apremilast   Rheumatoid arthritis (HCC)    a.) on MTX   SS-A antibody positive    Wears dentures    partial upper and lower    Past Surgical History:  Procedure Laterality Date   ANKLE SURGERY Right    CARPAL TUNNEL RELEASE     x3   COLONOSCOPY  2000?   COLONOSCOPY WITH PROPOFOL N/A 10/22/2017   Procedure: COLONOSCOPY WITH PROPOFOL;  Surgeon: Midge Minium, MD;  Location: [facility];  Service: Endoscopy;  Laterality: N/A;   COLONOSCOPY WITH PROPOFOL N/A 08/14/2022   Procedure: COLONOSCOPY WITH PROPOFOL;  Surgeon: Midge Minium, MD;  Location: [facility] ENDOSCOPY;  Service: Endoscopy;  Laterality: N/A;   ESOPHAGOGASTRODUODENOSCOPY (EGD) WITH PROPOFOL N/A 10/22/2017   Procedure: ESOPHAGOGASTRODUODENOSCOPY (EGD) WITH PROPOFOL;  Surgeon: Midge Minium, MD;  Location: [facility];  Service: Endoscopy;  Laterality: N/A;   ESOPHAGOGASTRODUODENOSCOPY (EGD) WITH PROPOFOL N/A 08/14/2022   Procedure: ESOPHAGOGASTRODUODENOSCOPY (EGD) WITH PROPOFOL;  Surgeon: Midge Minium, MD;  Location: [facility] ENDOSCOPY;  Service: Endoscopy;  Laterality: N/A;   FUSION OF  TALONAVICULAR JOINT Right 08/03/2015   Procedure: TAILOR NAVICULAR JOINT FUSION - RIGHT ;  Surgeon: Gwyneth Revels, DPM;  Location: [facility] ORS;  Service: Podiatry;  Laterality: Right;   HOT HEMOSTASIS  08/14/2022   Procedure: HOT HEMOSTASIS (ARGON PLASMA COAGULATION/BICAP);  Surgeon: Midge Minium, MD;  Location: [facility] ENDOSCOPY;  Service: Endoscopy;;   JOINT REPLACEMENT Right    knee x 3 ,right x1 and left x2   LOOP RECORDER IMPLANT  08/22/2021   Procedure: LOOP RECORDER IMPLANT; Location: [facility]; Surgeon: Sherryl Manges, MD   POLYPECTOMY N/A 10/22/2017   Procedure: POLYPECTOMY;  Surgeon: Midge Minium, MD;  Location: [facility];  Service: Endoscopy;  Laterality: N/A;   POLYPECTOMY  08/14/2022   Procedure: POLYPECTOMY;  Surgeon: Midge Minium, MD;  Location: [facility] ENDOSCOPY;  Service: Endoscopy;;  ROTATOR CUFF REPAIR Right 1995   TONSILLECTOMY     TOTAL KNEE REVISION Right 01/20/2018   Procedure: POLYETHYLENE EXCHANGE;  Surgeon: Donato Heinz, MD;  Location: [facility] ORS;  Service: Orthopedics;  Laterality: Right;   TOTAL KNEE REVISION Left 10/09/2021   Procedure: LEFT TOTAL KNEE REVISION WITH POLYETHYLENE EXCHANGE.;  Surgeon: Donato Heinz, MD;  Location: [facility] ORS;  Service: Orthopedics;  Laterality: Left;   TUBAL LIGATION     UPPER GI ENDOSCOPY  2000?    Family History  Problem Relation Age of Onset   Heart failure Mother    Gout Mother    Arthritis Mother    Hypertension Mother    Stroke Mother    Heart failure Father    Diabetes Father    Hyperlipidemia Father    COPD Sister    Cancer Maternal Aunt        breast   Cancer Maternal Uncle        kidney   Breast cancer Neg Hx     Social History:  reports that she quit smoking about 29 years ago. Her smoking use included cigarettes. She started smoking about 49 years ago. She has a 40 pack-year smoking history. She has never used smokeless tobacco. She reports that she does not drink alcohol and does not use drugs.  Allergies:   Allergies  Allergen Reactions   Ampicillin Swelling   Penicillins Anaphylaxis and Swelling    IgE = 10 (11/05/2017)  Has patient had a PCN reaction causing immediate rash, facial/tongue/throat swelling, SOB or lightheadedness with hypotension: Yes Has patient had a PCN reaction causing severe rash involving mucus membranes or skin necrosis: No Has patient had a PCN reaction that required hospitalization: No Has patient had a PCN reaction occurring within the last 10 years: Yes If all of the above answers are "NO", then may proceed with Cephalosporin use.   Vancomycin Rash    Other reaction(s): Other (see comments)   Vibramycin [Doxycycline Calcium] Rash    Current Medications: Current Outpatient Medications  Medication Sig Dispense Refill   acetaminophen (TYLENOL) 650 MG CR tablet Take 650 mg by mouth every 8 (eight) hours as needed for pain.     albuterol (VENTOLIN HFA) 108 (90 Base) MCG/ACT inhaler Inhale 2 puffs into the lungs every 6 (six) hours as needed for wheezing or shortness of breath. 8 g 2   allopurinol (ZYLOPRIM) 100 MG tablet Take 200 mg by mouth every morning.      betamethasone dipropionate 0.05 % cream APPLY TO PSORIASIS ON HANDS TWICE DAILY ONLY. AVOID FACE, GROIN AND AXILLA 45 g 0   calcipotriene (DOVONOX) 0.005 % cream APPLY TOPICALLY TO PSORIASIS AREAS ON LEGS AND FEET ONCE OR TWICE DAILY 540 g 1   Cholecalciferol (VITAMIN D3) 2000 units TABS Take 2,000 Units by mouth daily.     clobetasol ointment (TEMOVATE) 0.05 % Apply topically 2 (two) times daily.     diclofenac sodium (VOLTAREN) 1 % GEL Apply 2 g topically 3 (three) times daily.      FARXIGA 10 MG TABS tablet Take 10 mg by mouth every morning.     furosemide (LASIX) 40 MG tablet Take 40 mg by mouth daily.     gabapentin (NEURONTIN) 600 MG tablet Take 1 tablet (600 mg total) by mouth at bedtime. 30 tablet 5   HYDROcodone-acetaminophen (NORCO) 7.5-325 MG tablet Take 1 tablet by mouth every 8 (eight) hours as  needed for severe pain. Must last 30 days. 90  tablet 0   [START ON 09/21/2022] HYDROcodone-acetaminophen (NORCO) 7.5-325 MG tablet Take 1 tablet by mouth every 8 (eight) hours as needed for severe pain. Must last 30 days. 90 tablet 0   ketoconazole (NIZORAL) 2 % cream Apply topically daily as needed. 15 g 2   levothyroxine (SYNTHROID) 150 MCG tablet Take 1 tablet (150 mcg total) by mouth daily. 90 tablet 1   losartan (COZAAR) 25 MG tablet Take 25 mg by mouth daily.     meclizine (ANTIVERT) 12.5 MG tablet TAKE 1 TABLET(12.5 MG) BY MOUTH THREE TIMES DAILY AS NEEDED FOR DIZZINESS 30 tablet 1   metoprolol tartrate (LOPRESSOR) 25 MG tablet Take 1 tablet (25 mg total) by mouth 2 (two) times daily. 180 tablet 2   montelukast (SINGULAIR) 10 MG tablet TAKE 1 TABLET(10 MG) BY MOUTH AT BEDTIME 90 tablet 1   Multiple Vitamins-Calcium (ONE-A-DAY WOMENS FORMULA PO) Take 1 tablet by mouth daily.     mupirocin ointment (BACTROBAN) 2 % Apply 1 application topically 2 (two) times daily. 22 g 0   nystatin (MYCOSTATIN) 100000 UNIT/ML suspension Take 5 mLs (500,000 Units total) by mouth 4 (four) times daily. 60 mL 0   nystatin cream (MYCOSTATIN) Apply 1 Application topically 2 (two) times daily. 30 g 0   omega-3 acid ethyl esters (LOVAZA) 1 g capsule Take 1 capsule (1 g total) by mouth 2 (two) times daily. 180 capsule 1   omeprazole (PRILOSEC) 40 MG capsule TAKE 1 CAPSULE(40 MG) BY MOUTH DAILY 90 capsule 1   OTEZLA 30 MG TABS Take 1 tablet by mouth daily.     Polyethyl Glycol-Propyl Glycol (SYSTANE OP) Place 1 drop into both eyes daily as needed (dry eyes).     potassium chloride SA (KLOR-CON M) 20 MEQ tablet Take 1 tablet (20 mEq total) by mouth daily. 90 tablet 1   predniSONE (STERAPRED UNI-PAK 21 TAB) 10 MG (21) TBPK tablet Day 1 take 6 tablets, Day 2 take 5 tablets, Day 3 take 4 tablets, Day 4 take 3 tablets, Day 5 take 2 tablets D6 take 1 tablet 1 each 0   pregabalin (LYRICA) 25 MG capsule Take 25 mg by mouth 2  (two) times daily.     tiZANidine (ZANAFLEX) 4 MG tablet Take 1 tablet (4 mg total) by mouth at bedtime. 60 tablet 5   triamcinolone (KENALOG) 0.1 % Apply 1 application topically 2 (two) times daily. 30 g 0   No current facility-administered medications for this visit.   Facility-Administered Medications Ordered in Other Visits  Medication Dose Route Frequency Provider Last Rate Last Admin   0.9 %  sodium chloride infusion   Intravenous Continuous Rickard Patience, MD   Stopped at 09/04/22 1641    Review of Systems  Constitutional:  Negative for chills, diaphoresis, fever, malaise/fatigue and weight loss.  HENT:  Negative for congestion, ear discharge, ear pain, hearing loss, nosebleeds, sinus pain, sore throat and tinnitus.   Eyes:        Glaucoma  Respiratory:  Negative for cough, hemoptysis, sputum production and shortness of breath (on exertion, due to asthma).   Cardiovascular:  Positive for leg swelling (right ankle). Negative for chest pain and palpitations.  Gastrointestinal:  Negative for abdominal pain, blood in stool, constipation, diarrhea, heartburn, melena, nausea and vomiting.  Genitourinary:  Negative for dysuria, frequency, hematuria and urgency.  Musculoskeletal:  Positive for joint pain (arthritis). Negative for back pain, myalgias and neck pain.       Status post knee replacement  Skin:  Positive for itching and rash.  Neurological:  Positive for sensory change (foot numbness s/p surgery). Negative for dizziness, tingling, weakness and headaches.  Endo/Heme/Allergies:  Does not bruise/bleed easily.  Psychiatric/Behavioral:  Negative for depression and memory loss. The patient is not nervous/anxious and does not have insomnia.   All other systems reviewed and are negative.   Performance status (ECOG): 1-2  Vitals Blood pressure (!) 115/48, pulse (!) 59, temperature 97.7 F (36.5 C), resp. rate 18, weight 239 lb 4.8 oz (108.5 kg).   Physical Exam Vitals and nursing note  reviewed.  Constitutional:      General: She is not in acute distress.    Appearance: She is not diaphoretic.  HENT:     Mouth/Throat:     Comments: Thrush Eyes:     General: No scleral icterus.    Conjunctiva/sclera: Conjunctivae normal.  Abdominal:     Palpations: Abdomen is soft.  Skin:    General: Skin is warm.     Findings: Rash present.  Neurological:     Mental Status: She is alert and oriented to person, place, and time.  Psychiatric:        Behavior: Behavior normal.    Labs    Latest Ref Rng & Units 09/02/2022    2:11 PM 08/25/2022    1:36 PM 08/19/2022    3:08 PM  CBC  WBC 4.0 - 10.5 K/uL 5.8  5.5  6.5   Hemoglobin 12.0 - 15.0 g/dL 9.2  8.3  8.4   Hematocrit 36.0 - 46.0 % 30.8  27.8  27.9   Platelets 150 - 400 K/uL 254  214  213       Latest Ref Rng & Units 07/01/2022    3:42 PM 01/08/2022    3:31 PM 12/18/2021    7:48 PM  CMP  Glucose 70 - 99 mg/dL 94  191  478   BUN 8 - 27 mg/dL 25  35  31   Creatinine 0.57 - 1.00 mg/dL 2.95  6.21  3.08   Sodium 134 - 144 mmol/L 142  138  138   Potassium 3.5 - 5.2 mmol/L 3.8  3.9  4.0   Chloride 96 - 106 mmol/L 104  102  105   CO2 20 - 29 mmol/L 23  25  25    Calcium 8.7 - 10.3 mg/dL 65.7  9.8  84.6   Total Protein 6.0 - 8.5 g/dL 6.2  7.4    Total Bilirubin 0.0 - 1.2 mg/dL <9.6  0.4    Alkaline Phos 44 - 121 IU/L 115  53    AST 0 - 40 IU/L 21  21    ALT 0 - 32 IU/L 13  10

## 2022-09-08 ENCOUNTER — Ambulatory Visit: Payer: Medicare Other

## 2022-09-08 DIAGNOSIS — R002 Palpitations: Secondary | ICD-10-CM

## 2022-09-09 LAB — CUP PACEART REMOTE DEVICE CHECK
Date Time Interrogation Session: 20240714230906
Implantable Pulse Generator Implant Date: 20230629

## 2022-09-11 ENCOUNTER — Inpatient Hospital Stay: Payer: Medicare Other

## 2022-09-11 VITALS — BP 102/52 | HR 58 | Temp 96.7°F | Resp 19

## 2022-09-11 DIAGNOSIS — D649 Anemia, unspecified: Secondary | ICD-10-CM

## 2022-09-11 DIAGNOSIS — N183 Chronic kidney disease, stage 3 unspecified: Secondary | ICD-10-CM

## 2022-09-11 DIAGNOSIS — D631 Anemia in chronic kidney disease: Secondary | ICD-10-CM | POA: Diagnosis not present

## 2022-09-11 DIAGNOSIS — N1832 Chronic kidney disease, stage 3b: Secondary | ICD-10-CM | POA: Diagnosis not present

## 2022-09-11 MED ORDER — SODIUM CHLORIDE 0.9 % IV SOLN
INTRAVENOUS | Status: DC
  Filled 2022-09-11: qty 250

## 2022-09-11 MED ORDER — SODIUM CHLORIDE 0.9 % IV SOLN
200.0000 mg | Freq: Once | INTRAVENOUS | Status: AC
  Administered 2022-09-11: 200 mg via INTRAVENOUS
  Filled 2022-09-11: qty 200

## 2022-09-18 ENCOUNTER — Other Ambulatory Visit: Payer: Self-pay

## 2022-09-18 ENCOUNTER — Inpatient Hospital Stay: Payer: Medicare Other

## 2022-09-18 VITALS — BP 123/58 | HR 58 | Temp 98.6°F | Resp 18

## 2022-09-18 DIAGNOSIS — D649 Anemia, unspecified: Secondary | ICD-10-CM

## 2022-09-18 DIAGNOSIS — D631 Anemia in chronic kidney disease: Secondary | ICD-10-CM | POA: Diagnosis not present

## 2022-09-18 DIAGNOSIS — N183 Chronic kidney disease, stage 3 unspecified: Secondary | ICD-10-CM

## 2022-09-18 DIAGNOSIS — N1832 Chronic kidney disease, stage 3b: Secondary | ICD-10-CM | POA: Diagnosis not present

## 2022-09-18 DIAGNOSIS — E039 Hypothyroidism, unspecified: Secondary | ICD-10-CM

## 2022-09-18 MED ORDER — SODIUM CHLORIDE 0.9 % IV SOLN
Freq: Once | INTRAVENOUS | Status: AC
  Filled 2022-09-18: qty 250

## 2022-09-18 MED ORDER — LEVOTHYROXINE SODIUM 150 MCG PO TABS
150.0000 ug | ORAL_TABLET | Freq: Every day | ORAL | 0 refills | Status: DC
Start: 2022-09-18 — End: 2022-09-25

## 2022-09-18 MED ORDER — SODIUM CHLORIDE 0.9 % IV SOLN
200.0000 mg | Freq: Once | INTRAVENOUS | Status: AC
  Administered 2022-09-18: 200 mg via INTRAVENOUS
  Filled 2022-09-18: qty 200

## 2022-09-23 NOTE — Progress Notes (Signed)
Carelink Summary Report / Loop Recorder 

## 2022-09-25 ENCOUNTER — Ambulatory Visit (INDEPENDENT_AMBULATORY_CARE_PROVIDER_SITE_OTHER): Payer: Medicare Other | Admitting: Family Medicine

## 2022-09-25 ENCOUNTER — Encounter: Payer: Self-pay | Admitting: Family Medicine

## 2022-09-25 VITALS — BP 128/76 | HR 70 | Ht 71.0 in | Wt 236.0 lb

## 2022-09-25 DIAGNOSIS — E782 Mixed hyperlipidemia: Secondary | ICD-10-CM | POA: Diagnosis not present

## 2022-09-25 DIAGNOSIS — R42 Dizziness and giddiness: Secondary | ICD-10-CM | POA: Diagnosis not present

## 2022-09-25 DIAGNOSIS — E039 Hypothyroidism, unspecified: Secondary | ICD-10-CM | POA: Diagnosis not present

## 2022-09-25 DIAGNOSIS — K219 Gastro-esophageal reflux disease without esophagitis: Secondary | ICD-10-CM | POA: Diagnosis not present

## 2022-09-25 DIAGNOSIS — R8290 Unspecified abnormal findings in urine: Secondary | ICD-10-CM | POA: Diagnosis not present

## 2022-09-25 DIAGNOSIS — E876 Hypokalemia: Secondary | ICD-10-CM | POA: Diagnosis not present

## 2022-09-25 DIAGNOSIS — J069 Acute upper respiratory infection, unspecified: Secondary | ICD-10-CM

## 2022-09-25 LAB — POCT URINALYSIS DIPSTICK
Bilirubin, UA: NEGATIVE
Blood, UA: NEGATIVE
Glucose, UA: POSITIVE — AB
Ketones, UA: NEGATIVE
Leukocytes, UA: NEGATIVE
Nitrite, UA: NEGATIVE
Protein, UA: NEGATIVE
Spec Grav, UA: 1.015 (ref 1.010–1.025)
Urobilinogen, UA: 0.2 E.U./dL
pH, UA: 5 (ref 5.0–8.0)

## 2022-09-25 MED ORDER — OMEGA-3-ACID ETHYL ESTERS 1 G PO CAPS
1.0000 | ORAL_CAPSULE | Freq: Two times a day (BID) | ORAL | 1 refills | Status: DC
Start: 2022-09-25 — End: 2023-04-02

## 2022-09-25 MED ORDER — POTASSIUM CHLORIDE CRYS ER 20 MEQ PO TBCR
20.0000 meq | EXTENDED_RELEASE_TABLET | Freq: Every day | ORAL | 1 refills | Status: DC
Start: 2022-09-25 — End: 2022-10-14

## 2022-09-25 MED ORDER — MECLIZINE HCL 12.5 MG PO TABS
ORAL_TABLET | ORAL | 1 refills | Status: DC
Start: 2022-09-25 — End: 2023-12-18

## 2022-09-25 MED ORDER — LEVOTHYROXINE SODIUM 150 MCG PO TABS
150.0000 ug | ORAL_TABLET | Freq: Every day | ORAL | 1 refills | Status: DC
Start: 2022-09-25 — End: 2022-09-26

## 2022-09-25 MED ORDER — MONTELUKAST SODIUM 10 MG PO TABS
ORAL_TABLET | ORAL | 1 refills | Status: DC
Start: 2022-09-25 — End: 2023-04-02

## 2022-09-25 MED ORDER — OMEPRAZOLE 40 MG PO CPDR
DELAYED_RELEASE_CAPSULE | ORAL | 1 refills | Status: DC
Start: 2022-09-25 — End: 2023-04-02

## 2022-09-25 MED FILL — Iron Sucrose Inj 20 MG/ML (Fe Equiv): INTRAVENOUS | Qty: 10 | Status: AC

## 2022-09-25 NOTE — Progress Notes (Signed)
Date:  09/25/2022   Name:  Michele Moore   DOB:  1950/09/06   MRN:  [id number]   Chief Complaint: Hypothyroidism  thy  Thyroid Problem Presents for follow-up visit. Patient reports no anxiety, cold intolerance, constipation, depressed mood, diaphoresis, diarrhea, dry skin, fatigue, hair loss, heat intolerance, leg swelling, nail problem, palpitations, visual change, weight gain or weight loss.    Lab Results  Component Value Date   NA 142 07/01/2022   K 3.8 07/01/2022   CO2 23 07/01/2022   GLUCOSE 94 07/01/2022   BUN 25 07/01/2022   CREATININE 1.79 (H) 07/01/2022   CALCIUM 10.4 (H) 07/01/2022   EGFR 30 (L) 07/01/2022   GFRNONAA 25 (L) 01/08/2022   Lab Results  Component Value Date   CHOL 167 07/01/2022   HDL 47 07/01/2022   LDLCALC 96 07/01/2022   TRIG 135 07/01/2022   CHOLHDL 4.2 10/01/2018   Lab Results  Component Value Date   TSH 0.499 03/27/2022   Lab Results  Component Value Date   HGBA1C 5.3 11/25/2021   Lab Results  Component Value Date   WBC 5.8 09/02/2022   HGB 9.2 (L) 09/02/2022   HCT 30.8 (L) 09/02/2022   MCV 86.0 09/02/2022   PLT 254 09/02/2022   Lab Results  Component Value Date   ALT 13 07/01/2022   AST 21 07/01/2022   ALKPHOS 115 07/01/2022   BILITOT <0.2 07/01/2022   No results found for: "25OHVITD2", "25OHVITD3", "VD25OH"   Review of Systems  Constitutional:  Negative for diaphoresis, fatigue, weight gain and weight loss.  Cardiovascular:  Negative for chest pain and palpitations.  Gastrointestinal:  Positive for abdominal pain. Negative for constipation and diarrhea.  Endocrine: Negative for cold intolerance and heat intolerance.  Genitourinary:        Strong urine  Psychiatric/Behavioral:  The patient is not nervous/anxious.     Patient Active Problem List   Diagnosis Date Noted   Angiodysplasia of stomach and duodenum with hemorrhage 08/14/2022   Skin rash 08/04/2022   Thrush 08/04/2022   Symptomatic anemia 12/18/2021    Rheumatoid arthritis (HCC) 12/18/2021   S/P revision of total knee 10/09/2021   Chronic instability of left knee 02/12/2021   Long term methotrexate user 01/03/2021   Stage 3b chronic kidney disease (HCC) 01/03/2021   Piriformis syndrome of left side 11/15/2020   Normocytic anemia 04/22/2020   Lumbar spondylosis 02/16/2020   Arthritis of left knee 11/24/2019   Left hip pain 08/08/2019   Primary osteoarthritis of left hip 08/08/2019   Non-seasonal allergic rhinitis 04/12/2019   Bursitis of left shoulder 03/24/2019   Benign essential hypertension 03/06/2019   Proteinuria 03/06/2019   Trochanteric bursitis of left hip 10/04/2018   SI joint arthritis (left) 10/04/2018   Exertional chest pain 09/30/2018   CKD (chronic kidney disease), stage III (HCC) 09/30/2018   Psoriatic arthritis (HCC) 04/23/2018   Trigger ring finger of left hand 04/14/2018   Female pelvic pain 01/20/2018   History of revision of total knee arthroplasty 01/20/2018   Iron deficiency anemia    Heme + stool    Acute gastritis without hemorrhage    Gastric polyps    Benign neoplasm of ascending colon    Benign neoplasm of transverse colon    Polyp of sigmoid colon    Hypertension 10/01/2017   Hypothyroidism 10/01/2017   Gastroesophageal reflux disease 10/01/2017   Hypokalemia 10/01/2017   Mixed hyperlipidemia 10/01/2017   Reactive airway disease 10/01/2017   Bradycardia  08/24/2017   Severe obesity (BMI 35.0-35.9 with comorbidity) (HCC) 08/18/2017   Chronic gouty arthropathy without tophi 06/25/2017   Encounter for long-term (current) use of high-risk medication 06/25/2017   Positive ANA (antinuclear antibody) 06/17/2017   Cervical facet syndrome 06/16/2017   Primary osteoarthritis of right shoulder 06/16/2017   Sprain of anterior talofibular ligament of right ankle 06/16/2017   Lumbar radiculopathy 04/21/2017   Lumbar degenerative disc disease 04/21/2017   Chronic pain syndrome 04/21/2017   Spinal stenosis,  lumbar region, with neurogenic claudication 04/21/2017   SS-A antibody positive 03/05/2017   Chronic knee pain after total replacement of left knee joint 08/03/2015   DOE (dyspnea on exertion) 12/28/2013    Allergies  Allergen Reactions   Ampicillin Swelling   Penicillins Anaphylaxis and Swelling    IgE = 10 (11/05/2017)  Has patient had a PCN reaction causing immediate rash, facial/tongue/throat swelling, SOB or lightheadedness with hypotension: Yes Has patient had a PCN reaction causing severe rash involving mucus membranes or skin necrosis: No Has patient had a PCN reaction that required hospitalization: No Has patient had a PCN reaction occurring within the last 10 years: Yes If all of the above answers are "NO", then may proceed with Cephalosporin use.   Vancomycin Rash    Other reaction(s): Other (see comments)   Vibramycin [Doxycycline Calcium] Rash    Past Surgical History:  Procedure Laterality Date   ANKLE SURGERY Right    CARPAL TUNNEL RELEASE     x3   COLONOSCOPY  2000?   COLONOSCOPY WITH PROPOFOL N/A 10/22/2017   Procedure: COLONOSCOPY WITH PROPOFOL;  Surgeon: Midge Minium, MD;  Location: [facility];  Service: Endoscopy;  Laterality: N/A;   COLONOSCOPY WITH PROPOFOL N/A 08/14/2022   Procedure: COLONOSCOPY WITH PROPOFOL;  Surgeon: Midge Minium, MD;  Location: [facility] END[facility]COPY;  Service: Endoscopy;  Laterality: N/A;   ESOPHAGOGASTRODUODENOSCOPY (EGD) WITH PROPOFOL N/A 10/22/2017   Procedure: ESOPHAGOGASTRODUODENOSCOPY (EGD) WITH PROPOFOL;  Surgeon: Midge Minium, MD;  Location: [facility];  Service: Endoscopy;  Laterality: N/A;   ESOPHAGOGASTRODUODENOSCOPY (EGD) WITH PROPOFOL N/A 08/14/2022   Procedure: ESOPHAGOGASTRODUODENOSCOPY (EGD) WITH PROPOFOL;  Surgeon: Midge Minium, MD;  Location: [facility] ENDOSCOPY;  Service: Endoscopy;  Laterality: N/A;   FUSION OF TALONAVICULAR JOINT Right 08/03/2015   Procedure: TAILOR NAVICULAR JOINT FUSION - RIGHT ;  Surgeon:  Gwyneth Revels, DPM;  Location: [facility] ORS;  Service: Podiatry;  Laterality: Right;   HOT HEMOSTASIS  08/14/2022   Procedure: HOT HEMOSTASIS (ARGON PLASMA COAGULATION/BICAP);  Surgeon: Midge Minium, MD;  Location: [facility] ENDOSCOPY;  Service: Endoscopy;;   JOINT REPLACEMENT Right    knee x 3 ,right x1 and left x2   LOOP RECORDER IMPLANT  08/22/2021   Procedure: LOOP RECORDER IMPLANT; Location: [facility]; Surgeon: Sherryl Manges, MD   POLYPECTOMY N/A 10/22/2017   Procedure: POLYPECTOMY;  Surgeon: Midge Minium, MD;  Location: [facility];  Service: Endoscopy;  Laterality: N/A;   POLYPECTOMY  08/14/2022   Procedure: POLYPECTOMY;  Surgeon: Midge Minium, MD;  Location: [facility] ENDOSCOPY;  Service: Endoscopy;;   ROTATOR CUFF REPAIR Right 1995   TONSILLECTOMY     TOTAL KNEE REVISION Right 01/20/2018   Procedure: POLYETHYLENE EXCHANGE;  Surgeon: Donato Heinz, MD;  Location: [facility] OR[facility];  Service: Orthopedics;  Laterality: Right;   TOTAL KNEE REVISION Left 10/09/2021   Procedure: LEFT TOTAL KNEE REVISION WITH POLYETHYLENE EXCHANGE.;  Surgeon: Donato Heinz, MD;  Location: [facility] OR[facility];  Service: Orthopedics;  Laterality: Left;   TUBAL LIGATION  UPPER GI ENDOSCOPY  2000?    Social History   Tobacco Use   Smoking status: Former    Current packs/day: 0.00    Average packs/day: 2.0 packs/day for 20.0 years (40.0 ttl pk-yrs)    Types: Cigarettes    Start date: 02/24/1973    Quit date: 02/24/1993    Years since quitting: 29.6   Smokeless tobacco: Never  Vaping Use   Vaping status: Never Used  Substance Use Topics   Alcohol use: No    Alcohol/week: 0.0 standard drinks of alcohol   Drug use: No     Medication list has been reviewed and updated.  Current Meds  Medication Sig   acetaminophen (TYLENOL) 650 MG CR tablet Take 650 mg by mouth every 8 (eight) hours as needed for pain.   albuterol (VENTOLIN HFA) 108 (90 Base) MCG/ACT inhaler Inhale 2 puffs into the lungs every 6 (six) hours as needed for  wheezing or shortness of breath.   allopurinol (ZYLOPRIM) 100 MG tablet Take 200 mg by mouth every morning.    betamethasone dipropionate 0.05 % cream APPLY TO PSORIASIS ON HANDS TWICE DAILY ONLY. AVOID FACE, GROIN AND AXILLA   calcipotriene (DOVONOX) 0.005 % cream APPLY TOPICALLY TO PSORIASIS AREAS ON LEGS AND FEET ONCE OR TWICE DAILY   Cholecalciferol (VITAMIN D3) 2000 units TABS Take 2,000 Units by mouth daily.   clobetasol ointment (TEMOVATE) 0.05 % Apply topically 2 (two) times daily.   diclofenac sodium (VOLTAREN) 1 % GEL Apply 2 g topically 3 (three) times daily.    FARXIGA 10 MG TABS tablet Take 10 mg by mouth every morning.   furosemide (LASIX) 40 MG tablet Take 40 mg by mouth daily.   gabapentin (NEURONTIN) 600 MG tablet Take 1 tablet (600 mg total) by mouth at bedtime.   HYDROcodone-acetaminophen (NORCO) 7.5-325 MG tablet Take 1 tablet by mouth every 8 (eight) hours as needed for severe pain. Must last 30 days.   ketoconazole (NIZORAL) 2 % cream Apply topically daily as needed.   levothyroxine (SYNTHROID) 150 MCG tablet Take 1 tablet (150 mcg total) by mouth daily.   losartan (COZAAR) 25 MG tablet Take 25 mg by mouth daily.   meclizine (ANTIVERT) 12.5 MG tablet TAKE 1 TABLET(12.5 MG) BY MOUTH THREE TIMES DAILY AS NEEDED FOR DIZZINESS   metoprolol tartrate (LOPRESSOR) 25 MG tablet Take 1 tablet (25 mg total) by mouth 2 (two) times daily.   montelukast (SINGULAIR) 10 MG tablet TAKE 1 TABLET(10 MG) BY MOUTH AT BEDTIME   Multiple Vitamins-Calcium (ONE-A-DAY WOMENS FORMULA PO) Take 1 tablet by mouth daily.   mupirocin ointment (BACTROBAN) 2 % Apply 1 application topically 2 (two) times daily.   nystatin (MYCOSTATIN) 100000 UNIT/ML suspension Take 5 mLs (500,000 Units total) by mouth 4 (four) times daily.   nystatin cream (MYCOSTATIN) Apply 1 Application topically 2 (two) times daily.   omega-3 acid ethyl esters (LOVAZA) 1 g capsule Take 1 capsule (1 g total) by mouth 2 (two) times daily.    omeprazole (PRILOSEC) 40 MG capsule TAKE 1 CAPSULE(40 MG) BY MOUTH DAILY   OTEZLA 30 MG TABS Take 1 tablet by mouth daily.   Polyethyl Glycol-Propyl Glycol (SYSTANE OP) Place 1 drop into both eyes daily as needed (dry eyes).   potassium chloride SA (KLOR-CON M) 20 MEQ tablet Take 1 tablet (20 mEq total) by mouth daily.   pregabalin (LYRICA) 25 MG capsule Take 25 mg by mouth 2 (two) times daily.   tiZANidine (ZANAFLEX) 4 MG tablet  Take 1 tablet (4 mg total) by mouth at bedtime.   triamcinolone (KENALOG) 0.1 % Apply 1 application topically 2 (two) times daily.       09/25/2022    3:17 PM 03/27/2022    3:33 PM 11/25/2021    2:52 PM 09/16/2021    1:43 PM  GAD 7 : Generalized Anxiety Score  Nervous, Anxious, on Edge 0 0 0 0  Control/stop worrying 0 0 0 0  Worry too much - different things 0 0 0 0  Trouble relaxing 0 0 0 3  Restless 0 0 0 0  Easily annoyed or irritable 0 0 0 0  Afraid - awful might happen 0 0 0 0  Total GAD 7 Score 0 0 0 3  Anxiety Difficulty Not difficult at all Not difficult at all  Not difficult at all       09/25/2022    3:17 PM 07/17/2022    2:28 PM 03/27/2022    3:33 PM  Depression screen PHQ 2/9  Decreased Interest 0 0 0  Down, Depressed, Hopeless 0 0 0  PHQ - 2 Score 0 0 0  Altered sleeping 0  0  Tired, decreased energy 0  0  Change in appetite 0  0  Feeling bad or failure about yourself  0  0  Trouble concentrating 0  0  Moving slowly or fidgety/restless 0  0  Suicidal thoughts 0  0  PHQ-9 Score 0  0  Difficult doing work/chores Not difficult at all  Not difficult at all    BP Readings from Last 3 Encounters:  09/25/22 128/76  09/18/22 (!) 123/58  09/11/22 (!) 102/52    Physical Exam Vitals and nursing note reviewed. Exam conducted with a chaperone present.  Constitutional:      General: She is not in acute distress.    Appearance: She is not diaphoretic.  HENT:     Head: Normocephalic and atraumatic.     Right Ear: Tympanic membrane, ear canal  and external ear normal.     Left Ear: Tympanic membrane, ear canal and external ear normal.     Nose: Nose normal. No congestion or rhinorrhea.  Eyes:     General:        Right eye: No discharge.        Left eye: No discharge.     Conjunctiva/sclera: Conjunctivae normal.     Pupils: Pupils are equal, round, and reactive to light.  Neck:     Thyroid: No thyromegaly.     Vascular: No JVD.  Cardiovascular:     Rate and Rhythm: Normal rate and regular rhythm.     Heart sounds: Normal heart sounds. No murmur heard.    No friction rub. No gallop.  Pulmonary:     Effort: Pulmonary effort is normal.     Breath sounds: Normal breath sounds. No wheezing, rhonchi or rales.  Abdominal:     General: Bowel sounds are normal.     Palpations: Abdomen is soft. There is no mass.     Tenderness: There is no abdominal tenderness. There is no guarding.  Musculoskeletal:        General: Normal range of motion.     Cervical back: Normal range of motion and neck supple.  Lymphadenopathy:     Cervical: No cervical adenopathy.  Skin:    General: Skin is warm and dry.  Neurological:     Mental Status: She is alert.     Deep Tendon Reflexes:  Reflexes are normal and symmetric.     Wt Readings from Last 3 Encounters:  09/25/22 236 lb (107 kg)  09/04/22 239 lb 4.8 oz (108.5 kg)  08/14/22 241 lb (109.3 kg)    BP 128/76   Pulse 70   Ht 5\' 11"  (1.803 m)   Wt 236 lb (107 kg)   SpO2 96%   BMI 32.92 kg/m   Assessment and Plan: 1. Hypothyroidism, unspecified type Chronic.  Controlled.  Stable.  Symptomatology suggestive patient is getting reasonable control with levothyroxine 150 mcg daily.  Will check TSH for sufficient coverage. - levothyroxine (SYNTHROID) 150 MCG tablet; Take 1 tablet (150 mcg total) by mouth daily.  Dispense: 90 tablet; Refill: 1 - TSH  2. Vertigo Chronic.  Episodic.  Controlled.  Stable.  This is episodic and on a as needed basis patient may take meclizine 1 tablet as needed  dizziness. - meclizine (ANTIVERT) 12.5 MG tablet; TAKE 1 TABLET(12.5 MG) BY MOUTH THREE TIMES DAILY AS NEEDED FOR DIZZINESS  Dispense: 30 tablet; Refill: 1  3. Viral upper respiratory tract infection Acute.  Recurrent.  On a as needed basis patient will take Singulair 10 mg whether of an allergy flareup or viral exposure. - montelukast (SINGULAIR) 10 MG tablet; TAKE 1 TABLET(10 MG) BY MOUTH AT BEDTIME  Dispense: 90 tablet; Refill: 1  4. Mixed hyperlipidemia Chronic.  Controlled.  Stable.  Continue Lovaza 1 g capsule twice a day. - omega-3 acid ethyl esters (LOVAZA) 1 g capsule; Take 1 capsule (1 g total) by mouth 2 (two) times daily.  Dispense: 180 capsule; Refill: 1  5. Gastroesophageal reflux disease 1.  Controlled.  Stable.  Continue omeprazole 40 mg once a day. - omeprazole (PRILOSEC) 40 MG capsule; TAKE 1 CAPSULE(40 MG) BY MOUTH DAILY  Dispense: 90 capsule; Refill: 1  6. Hypokalemia Chronic.  Controlled.  Stable.  Will check renal function panel to see if sufficient control with potassium supplementation 20 mEq adequate for his supplementation. - potassium chloride SA (KLOR-CON M) 20 MEQ tablet; Take 1 tablet (20 mEq total) by mouth daily.  Dispense: 90 tablet; Refill: 1 - Renal Function Panel  7. Bad odor of urine Patient has bad odor urine with some distal comfort in the suprapubic area we will check urine for infection.  Urine dipstick is unremarkable for infection and does show some glucose most likely because she is on Jardiance. - POCT urinalysis dipstick     Elizabeth Sauer, MD

## 2022-09-26 ENCOUNTER — Other Ambulatory Visit: Payer: Self-pay

## 2022-09-26 ENCOUNTER — Inpatient Hospital Stay: Payer: Medicare Other

## 2022-09-26 ENCOUNTER — Inpatient Hospital Stay: Payer: Medicare Other | Attending: Oncology

## 2022-09-26 VITALS — BP 119/65 | HR 64 | Temp 98.4°F

## 2022-09-26 DIAGNOSIS — D631 Anemia in chronic kidney disease: Secondary | ICD-10-CM | POA: Diagnosis not present

## 2022-09-26 DIAGNOSIS — N1832 Chronic kidney disease, stage 3b: Secondary | ICD-10-CM | POA: Diagnosis present

## 2022-09-26 DIAGNOSIS — N183 Chronic kidney disease, stage 3 unspecified: Secondary | ICD-10-CM

## 2022-09-26 DIAGNOSIS — Z79899 Other long term (current) drug therapy: Secondary | ICD-10-CM | POA: Diagnosis not present

## 2022-09-26 DIAGNOSIS — E039 Hypothyroidism, unspecified: Secondary | ICD-10-CM

## 2022-09-26 DIAGNOSIS — D649 Anemia, unspecified: Secondary | ICD-10-CM

## 2022-09-26 MED ORDER — SODIUM CHLORIDE 0.9 % IV SOLN
200.0000 mg | Freq: Once | INTRAVENOUS | Status: AC
  Administered 2022-09-26: 200 mg via INTRAVENOUS
  Filled 2022-09-26: qty 200

## 2022-09-26 MED ORDER — SODIUM CHLORIDE 0.9 % IV SOLN
Freq: Once | INTRAVENOUS | Status: AC
  Filled 2022-09-26: qty 250

## 2022-09-26 MED ORDER — LEVOTHYROXINE SODIUM 137 MCG PO TABS
137.0000 ug | ORAL_TABLET | Freq: Every day | ORAL | 0 refills | Status: AC
Start: 2022-09-26 — End: ?

## 2022-09-26 NOTE — Patient Instructions (Signed)
Iron Sucrose Injection What is this medication? IRON SUCROSE (EYE ern SOO krose) treats low levels of iron (iron deficiency anemia) in people with kidney disease. Iron is a mineral that plays an important role in making red blood cells, which carry oxygen from your lungs to the rest of your body. This medicine may be used for other purposes; ask your health care provider or pharmacist if you have questions. COMMON BRAND NAME(S): Venofer What should I tell my care team before I take this medication? They need to know if you have any of these conditions: Anemia not caused by low iron levels Heart disease High levels of iron in the blood Kidney disease Liver disease An unusual or allergic reaction to iron, other medications, foods, dyes, or preservatives Pregnant or trying to get pregnant Breastfeeding How should I use this medication? This medication is for infusion into a vein. It is given in a hospital or clinic setting. Talk to your care team about the use of this medication in children. While this medication may be prescribed for children as young as 2 years for selected conditions, precautions do apply. Overdosage: If you think you have taken too much of this medicine contact a poison control center or emergency room at once. NOTE: This medicine is only for you. Do not share this medicine with others. What if I miss a dose? Keep appointments for follow-up doses. It is important not to miss your dose. Call your care team if you are unable to keep an appointment. What may interact with this medication? Do not take this medication with any of the following: Deferoxamine Dimercaprol Other iron products This medication may also interact with the following: Chloramphenicol Deferasirox This list may not describe all possible interactions. Give your health care provider a list of all the medicines, herbs, non-prescription drugs, or dietary supplements you use. Also tell them if you smoke,  drink alcohol, or use illegal drugs. Some items may interact with your medicine. What should I watch for while using this medication? Visit your care team regularly. Tell your care team if your symptoms do not start to get better or if they get worse. You may need blood work done while you are taking this medication. You may need to follow a special diet. Talk to your care team. Foods that contain iron include: whole grains/cereals, dried fruits, beans, or peas, leafy green vegetables, and organ meats (liver, kidney). What side effects may I notice from receiving this medication? Side effects that you should report to your care team as soon as possible: Allergic reactions--skin rash, itching, hives, swelling of the face, lips, tongue, or throat Low blood pressure--dizziness, feeling faint or lightheaded, blurry vision Shortness of breath Side effects that usually do not require medical attention (report to your care team if they continue or are bothersome): Flushing Headache Joint pain Muscle pain Nausea Pain, redness, or irritation at injection site This list may not describe all possible side effects. Call your doctor for medical advice about side effects. You may report side effects to FDA at 1-800-FDA-1088. Where should I keep my medication? This medication is given in a hospital or clinic. It will not be stored at home. NOTE: This sheet is a summary. It may not cover all possible information. If you have questions about this medicine, talk to your doctor, pharmacist, or health care provider.  2024 Elsevier/Gold Standard (2022-07-18 00:00:00)

## 2022-10-01 MED FILL — Iron Sucrose Inj 20 MG/ML (Fe Equiv): INTRAVENOUS | Qty: 10 | Status: AC

## 2022-10-02 ENCOUNTER — Ambulatory Visit: Payer: Medicare Other

## 2022-10-02 ENCOUNTER — Other Ambulatory Visit: Payer: Self-pay

## 2022-10-02 ENCOUNTER — Other Ambulatory Visit: Payer: Medicare Other

## 2022-10-02 ENCOUNTER — Inpatient Hospital Stay: Payer: Medicare Other

## 2022-10-02 VITALS — BP 109/61

## 2022-10-02 DIAGNOSIS — N183 Chronic kidney disease, stage 3 unspecified: Secondary | ICD-10-CM

## 2022-10-02 DIAGNOSIS — D649 Anemia, unspecified: Secondary | ICD-10-CM

## 2022-10-02 DIAGNOSIS — D631 Anemia in chronic kidney disease: Secondary | ICD-10-CM | POA: Diagnosis not present

## 2022-10-02 DIAGNOSIS — Z79899 Other long term (current) drug therapy: Secondary | ICD-10-CM | POA: Diagnosis not present

## 2022-10-02 DIAGNOSIS — E039 Hypothyroidism, unspecified: Secondary | ICD-10-CM

## 2022-10-02 DIAGNOSIS — N1832 Chronic kidney disease, stage 3b: Secondary | ICD-10-CM | POA: Diagnosis not present

## 2022-10-02 LAB — HEMOGLOBIN AND HEMATOCRIT, BLOOD
HCT: 31.9 % — ABNORMAL LOW (ref 36.0–46.0)
Hemoglobin: 9.9 g/dL — ABNORMAL LOW (ref 12.0–15.0)

## 2022-10-02 LAB — SAMPLE TO BLOOD BANK

## 2022-10-02 LAB — TSH: TSH: 0.052 u[IU]/mL — ABNORMAL LOW (ref 0.350–4.500)

## 2022-10-02 MED ORDER — EPOETIN ALFA-EPBX 20000 UNIT/ML IJ SOLN
20000.0000 [IU] | Freq: Once | INTRAMUSCULAR | Status: AC
  Administered 2022-10-02: 20000 [IU] via SUBCUTANEOUS
  Filled 2022-10-02: qty 1

## 2022-10-09 DIAGNOSIS — L03011 Cellulitis of right finger: Secondary | ICD-10-CM | POA: Diagnosis not present

## 2022-10-09 DIAGNOSIS — M069 Rheumatoid arthritis, unspecified: Secondary | ICD-10-CM | POA: Diagnosis not present

## 2022-10-11 LAB — CUP PACEART REMOTE DEVICE CHECK
Date Time Interrogation Session: 20240816230401
Implantable Pulse Generator Implant Date: 20230629

## 2022-10-13 ENCOUNTER — Ambulatory Visit: Payer: Medicare Other

## 2022-10-13 DIAGNOSIS — R002 Palpitations: Secondary | ICD-10-CM | POA: Diagnosis not present

## 2022-10-14 ENCOUNTER — Other Ambulatory Visit: Payer: Self-pay | Admitting: Family Medicine

## 2022-10-14 ENCOUNTER — Ambulatory Visit
Payer: Medicare Other | Attending: Student in an Organized Health Care Education/Training Program | Admitting: Student in an Organized Health Care Education/Training Program

## 2022-10-14 ENCOUNTER — Encounter: Payer: Self-pay | Admitting: Student in an Organized Health Care Education/Training Program

## 2022-10-14 VITALS — BP 109/88 | HR 62 | Temp 97.3°F | Resp 18

## 2022-10-14 DIAGNOSIS — M47818 Spondylosis without myelopathy or radiculopathy, sacral and sacrococcygeal region: Secondary | ICD-10-CM

## 2022-10-14 DIAGNOSIS — M5416 Radiculopathy, lumbar region: Secondary | ICD-10-CM | POA: Diagnosis not present

## 2022-10-14 DIAGNOSIS — M19011 Primary osteoarthritis, right shoulder: Secondary | ICD-10-CM

## 2022-10-14 DIAGNOSIS — G588 Other specified mononeuropathies: Secondary | ICD-10-CM | POA: Diagnosis not present

## 2022-10-14 DIAGNOSIS — Z96652 Presence of left artificial knee joint: Secondary | ICD-10-CM

## 2022-10-14 DIAGNOSIS — M47816 Spondylosis without myelopathy or radiculopathy, lumbar region: Secondary | ICD-10-CM | POA: Insufficient documentation

## 2022-10-14 DIAGNOSIS — M47812 Spondylosis without myelopathy or radiculopathy, cervical region: Secondary | ICD-10-CM | POA: Insufficient documentation

## 2022-10-14 DIAGNOSIS — G5702 Lesion of sciatic nerve, left lower limb: Secondary | ICD-10-CM | POA: Insufficient documentation

## 2022-10-14 DIAGNOSIS — E876 Hypokalemia: Secondary | ICD-10-CM

## 2022-10-14 DIAGNOSIS — M25562 Pain in left knee: Secondary | ICD-10-CM | POA: Insufficient documentation

## 2022-10-14 DIAGNOSIS — M1612 Unilateral primary osteoarthritis, left hip: Secondary | ICD-10-CM | POA: Diagnosis not present

## 2022-10-14 DIAGNOSIS — G894 Chronic pain syndrome: Secondary | ICD-10-CM | POA: Diagnosis not present

## 2022-10-14 DIAGNOSIS — G8929 Other chronic pain: Secondary | ICD-10-CM | POA: Insufficient documentation

## 2022-10-14 MED ORDER — HYDROCODONE-ACETAMINOPHEN 7.5-325 MG PO TABS: 1.0000 | ORAL_TABLET | Freq: Three times a day (TID) | ORAL | 0 refills | Status: AC | PRN

## 2022-10-14 MED ORDER — HYDROCODONE-ACETAMINOPHEN 7.5-325 MG PO TABS: 1.0000 | ORAL_TABLET | Freq: Three times a day (TID) | ORAL | 0 refills | Status: DC | PRN

## 2022-10-14 NOTE — Progress Notes (Signed)
PROVIDER NOTE: Information contained herein reflects review and annotations entered in association with encounter. Interpretation of such information and data should be left to medically-trained personnel. Information provided to patient can be located elsewhere in the medical record under "Patient Instructions". Document created using STT-dictation technology, any transcriptional errors that may result from process are unintentional.    Patient: Michele Moore  Service Category: E/M  Provider: Edward Jolly, MD  DOB: Jul 10, 1950  DOS: 10/14/2022  Referring Provider: Duanne Limerick, MD  MRN: [id number]  Specialty: Interventional Pain Management  PCP: Duanne Limerick, MD  Type: Established Patient  Setting: Ambulatory outpatient    Location: Office  Delivery: Face-to-face     HPI  [name], a 72 y.o. year old female, is here today because of her Chronic knee pain after total replacement of left knee joint [M25.562, G89.29, Z96.652]. Michele Moore's primary complain today is Back Pain (low), Hip Pain (bilateral), Shoulder Pain (right), and Knee Pain (left)  Pertinent problems: Michele Moore has Chronic knee pain after total replacement of left knee joint; Lumbar radiculopathy; Lumbar degenerative disc disease; Chronic pain syndrome; Spinal stenosis, lumbar region, with neurogenic claudication; Cervical facet syndrome; Primary osteoarthritis of right shoulder; Sprain of anterior talofibular ligament of right ankle; Severe obesity (BMI 35.0-35.9 with comorbidity) (HCC); CKD (chronic kidney disease), stage III (HCC); Primary osteoarthritis of left hip; and Lumbar spondylosis on their pertinent problem list. Pain Assessment: Severity of Chronic pain is reported as a 8 /10. Location: Knee Left/to ankle. Onset: More than a month ago. Quality: Constant, Aching, Stabbing. Timing: Constant. Modifying factor(s): medications, elevation of leg. Vitals:  temporal temperature is 97.3 F (36.3 C) (abnormal). Her blood pressure  is 109/88 and her pulse is 62. Her respiration is 18 and oxygen saturation is 99%.  BMI: Estimated body mass index is 32.92 kg/m as calculated from the following:   Height as of 09/25/22: 5\' 11"  (1.803 m).   Weight as of 09/25/22: 236 lb (107 kg). Last encounter: 07/17/2022. Last procedure: 05/05/2022.  Reason for encounter: medication management.  Continues to have persistent left knee pain in the context of having left knee revision surgery.  Up to see Dr. Ernest Pine with orthopedics later this week. Patient is also complaining of low back pain with radiation into her left buttock and left thigh.  This was likely related to lumbar radicular pain.  She had a left L4-L5 ESI with me on February 10, 2022.  Given increased pain recommend repeating left L4-L5 ESI. Patient also has numbness and tingling overlying her left buttock.  This could be consistent with cluneal neuropathy.  We also discussed a cluneal nerve block.  Pharmacotherapy Assessment  Analgesic: Norco 7.5 mg TID PRN #90/month, 22.5    Monitoring: [place] PMP: PDMP reviewed during this encounter.       Pharmacotherapy: No side-effects or adverse reactions reported. Compliance: No problems identified. Effectiveness: Clinically acceptable.  Michele Barbara, RN  10/14/2022  2:30 PM  Signed Nursing Pain Medication Assessment:  Safety precautions to be maintained throughout the outpatient stay will include: orient to surroundings, keep bed in low position, maintain call bell within reach at all times, provide assistance with transfer out of bed and ambulation.  Medication Inspection Compliance: Pill count conducted under aseptic conditions, in front of the patient. Neither the pills nor the bottle was removed from the patient's sight at any time. Once count was completed pills were immediately returned to the patient in their original bottle.  Medication: Hydrocodone/APAP  Pill/Patch Count:  24 of 90 pills remain Pill/Patch Appearance: Markings  consistent with prescribed medication Bottle Appearance: Standard pharmacy container. Clearly labeled. Filled Date: 07 / 29 / 2024 Last Medication intake:  Today    No results found for: "CBDTHCR" No results found for: "D8THCCBX" No results found for: "D9THCCBX"  UDS:  Summary  Date Value Ref Range Status  07/17/2022 Note  Final    Comment:    ==================================================================== [facility] (MW) ==================================================================== Test                             Result       Flag       Units  Drug Present and Declared for Prescription Verification   Hydrocodone                    741          EXPECTED   ng/mg creat   Hydromorphone                  182          EXPECTED   ng/mg creat   Norhydrocodone                 339          EXPECTED   ng/mg creat    Sources of hydrocodone include scheduled prescription medications.    Hydromorphone and norhydrocodone are expected metabolites of    hydrocodone. Hydromorphone is also available as a scheduled    prescription medication.  ==================================================================== Test                      Result    Flag   Units      Ref Range   Creatinine              51               mg/dL      >=21 ==================================================================== Declared Medications:  The flagging and interpretation on this report are based on the  following declared medications.  Unexpected results may arise from  inaccuracies in the declared medications.   **Note: The testing scope of this panel includes these medications:   Hydrocodone (Norco)   **Note: The testing scope of this panel does not include the  following reported medications:   Acetaminophen (Tylenol)  Acetaminophen (Norco)  Albuterol (Ventolin HFA)  Allopurinol (Zyloprim)  Apremilast (Otezla)  Betamethasone  Calcipotriene  Calcium  Clobetasol (Temovate)   Dapagliflozin (Farxiga)  Fluticasone (Flonase)  Furosemide (Lasix)  Gabapentin (Neurontin)  Ketoconazole (Nizoral)  Levothyroxine (Synthroid)  Losartan (Cozaar)  Meclizine (Antivert)  Metoprolol (Lopressor)  Mometasone (Nasonex)  Montelukast (Singulair)  Multivitamin  Mupirocin (Bactroban)  Nystatin (Mycostatin)  Omega-3 Fatty Acids  Omeprazole (Prilosec)  Polyethylene Glycol  Potassium (Klor-Con)  Tizanidine (Zanaflex)  Topical Diclofenac (Voltaren)  Triamcinolone (Kenalog)  Vitamin D3 ==================================================================== For clinical consultation, please call [phone]. ====================================================================       ROS  Constitutional: Denies any fever or chills Gastrointestinal: No reported hemesis, hematochezia, vomiting, or acute GI distress Musculoskeletal:  Low back pain with radiation into left buttock and left hip Neurological:  Numbness overlying left buttock  Medication Review  Apremilast, HYDROcodone-acetaminophen, Multiple Vitamins-Calcium, Polyethyl Glycol-Propyl Glycol, Vitamin D3, acetaminophen, albuterol, allopurinol, betamethasone dipropionate, calcipotriene, clobetasol ointment, dapagliflozin propanediol, diclofenac sodium, fluticasone, furosemide, gabapentin, ketoconazole, levothyroxine, losartan, meclizine, metoprolol tartrate, mometasone, montelukast, mupirocin ointment,  nystatin, nystatin cream, omega-3 acid ethyl esters, omeprazole, potassium chloride SA, predniSONE, tiZANidine, and triamcinolone cream  History Review  Allergy: Michele Moore is allergic to ampicillin, penicillins, vancomycin, and vibramycin [doxycycline calcium]. Drug: Michele Moore  reports no history of drug use. Alcohol:  reports no history of alcohol use. Tobacco:  reports that she quit smoking about 29 years ago. Her smoking use included cigarettes. She started smoking about 49 years ago. She has a 40 pack-year smoking  history. She has never used smokeless tobacco. Social: Michele Moore  reports that she quit smoking about 29 years ago. Her smoking use included cigarettes. She started smoking about 49 years ago. She has a 40 pack-year smoking history. She has never used smokeless tobacco. She reports that she does not drink alcohol and does not use drugs. Medical:  has a past medical history of (HFpEF) heart failure with preserved ejection fraction (HCC) (01/13/2014), Allergy, ANA positive, Anemia of chronic renal failure, stage 3 (moderate), unspecified whether stage 3a or 3b CKD (HCC), Aortic atherosclerosis (HCC), Asthma, Cataract, Chronic pain syndrome, Chronic, continuous use of opioids, CKD (chronic kidney disease), stage III (HCC), Complication of anesthesia, DDD (degenerative disc disease), lumbar, Dyspnea on exertion, Dysrhythmia, Family history of adverse reaction to anesthesia, GERD (gastroesophageal reflux disease), Glaucoma, Gout, H/O tooth extraction, Hemorrhoid, History of hiatal hernia, History of kidney stones, Hypertension, Hypothyroidism, Implantable loop recorder present (08/22/2021), Long term current use of immunosuppressive drug, Migraines, Mixed hyperlipidemia, Multiple gastric ulcers, Osteoarthritis, Psoriasis, Psoriatic arthritis (HCC), Rheumatoid arthritis (HCC), SS-A antibody positive, and Wears dentures. Surgical: Michele Moore  has a past surgical history that includes Carpal tunnel release; Rotator cuff repair (Right, 1995); Ankle surgery (Right); Tubal ligation; Colonoscopy (2000?); Upper gi endoscopy (2000?); Tonsillectomy; Fusion of talonavicular joint (Right, 08/03/2015); Colonoscopy with propofol (N/A, 10/22/2017); Esophagogastroduodenoscopy (egd) with propofol (N/A, 10/22/2017); polypectomy (N/A, 10/22/2017); Total knee revision (Right, 01/20/2018); Joint replacement (Right); Loop recorder implant (08/22/2021); Total knee revision (Left, 10/09/2021); Colonoscopy with propofol (N/A, 08/14/2022);  Esophagogastroduodenoscopy (egd) with propofol (N/A, 08/14/2022); Hot hemostasis (08/14/2022); and polypectomy (08/14/2022). Family: family history includes Arthritis in her mother; COPD in her sister; Cancer in her maternal aunt and maternal uncle; Diabetes in her father; Gout in her mother; Heart failure in her father and mother; Hyperlipidemia in her father; Hypertension in her mother; Stroke in her mother.  Laboratory Chemistry Profile   Renal Lab Results  Component Value Date   BUN 21 09/25/2022   CREATININE 1.63 (H) 09/25/2022   BCR 13 09/25/2022   GFRAA 30 (L) 08/28/2019   GFRNONAA 25 (L) 01/08/2022    Hepatic Lab Results  Component Value Date   AST 21 07/01/2022   ALT 13 07/01/2022   ALBUMIN 4.1 09/25/2022   ALKPHOS 115 07/01/2022    Electrolytes Lab Results  Component Value Date   NA 144 09/25/2022   K 4.1 09/25/2022   CL 104 09/25/2022   CALCIUM 10.9 (H) 09/25/2022   MG 2.0 10/01/2018   PHOS 3.0 09/25/2022    Bone No results found for: "VD25OH", "VD125OH2TOT", "ZO1096EA5", "WU9811BJ4", "25OHVITD1", "25OHVITD2", "25OHVITD3", "TESTOFREE", "TESTOSTERONE"  Inflammation (CRP: Acute Phase) (ESR: Chronic Phase) Lab Results  Component Value Date   CRP 1.6 (H) 09/27/2021   ESRSEDRATE 79 (H) 09/27/2021         Note: Above Lab results reviewed.  Recent Imaging Review  CUP PACEART REMOTE DEVICE CHECK ILR summary report received. Battery status OK. Normal device function. No new symptom, tachy, brady, or pause episodes. No new AF episodes. Monthly  summary reports and ROV/PRN - CS, CVRS Note: Reviewed        Physical Exam  General appearance: Well nourished, well developed, and well hydrated. In no apparent acute distress Mental status: Alert, oriented x 3 (person, place, & time)       Respiratory: No evidence of acute respiratory distress Eyes: PERLA Vitals: BP 109/88   Pulse 62   Temp (!) 97.3 F (36.3 C) (Temporal)   Resp 18   SpO2 99%  BMI: Estimated body mass  index is 32.92 kg/m as calculated from the following:   Height as of 09/25/22: 5\' 11"  (1.803 m).   Weight as of 09/25/22: 236 lb (107 kg). Ideal: Patient weight not recorded  Lumbar Spine Area Exam  Skin & Axial Inspection: No masses, redness, or swelling Alignment: Symmetrical Functional ROM: Improved after treatment       Stability: No instability detected Muscle Tone/Strength: Functionally intact. No obvious neuro-muscular anomalies detected. Sensory (Neurological): Dermatomal pain pattern, left L4-L5   Gait & Posture Assessment  Ambulation: Limited Gait: Antalgic Posture: Difficulty standing up straight, due to pain    Lower Extremity Exam      Side: Right lower extremity   Side: Left lower extremity  Stability: No instability observed           Stability: No instability observed          Skin & Extremity Inspection: Skin color, temperature, and hair growth are WNL. No peripheral edema or cyanosis. No masses, redness, swelling, asymmetry, or associated skin lesions. No contractures.   Skin & Extremity Inspection: Skin color, temperature, and hair growth are WNL. No peripheral edema or cyanosis. No masses, redness, swelling, asymmetry, or associated skin lesions. No contractures.  Functional ROM: Unrestricted ROM                   Functional ROM: Decreased ROM for hip and knee joints          Muscle Tone/Strength: Functionally intact. No obvious neuro-muscular anomalies detected.   Muscle Tone/Strength: Functionally intact. No obvious neuro-muscular anomalies detected.  Sensory (Neurological): Unimpaired   Sensory (Neurological): Dermatomal pain pattern and arthropathic of left knee  Palpation: No palpable anomalies   Palpation: No palpable anomalies    Assessment   Diagnosis Status  1. 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 Lumbar radiculopathy   3. Cluneal neuropathy   4. Primary osteoarthritis of right shoulder   5. Lumbar spondylosis   6. Primary  osteoarthritis of left hip   7. Piriformis syndrome of left side   8. Cervical spondylosis without myelopathy   9. SI joint arthritis   10. Chronic pain syndrome    Persistent Having a Flare-up Having a Flare-up   Updated Problems: No problems updated.  Plan of Care  Problem-specific:  No problem-specific Assessment & Plan notes found for this encounter.  Michele Moore has a current medication list which includes the following long-term medication(s): albuterol, allopurinol, gabapentin, levothyroxine, metoprolol tartrate, montelukast, omega-3 acid ethyl esters, omeprazole, potassium chloride sa, [DISCONTINUED] fluticasone, and [DISCONTINUED] mometasone.  Pharmacotherapy (Medications Ordered): Meds ordered this encounter  Medications   HYDROcodone-acetaminophen (NORCO) 7.5-325 MG tablet    Sig: Take 1 tablet by mouth every 8 (eight) hours as needed for severe pain. Must last 30 days.    Dispense:  90 tablet    Refill:  0    Chronic Pain: STOP Act (Not applicable) Fill 1 day early if closed  on refill date. Avoid benzodiazepines within 8 hours of opioids   HYDROcodone-acetaminophen (NORCO) 7.5-325 MG tablet    Sig: Take 1 tablet by mouth every 8 (eight) hours as needed for severe pain. Must last 30 days.    Dispense:  90 tablet    Refill:  0    Chronic Pain: STOP Act (Not applicable) Fill 1 day early if closed on refill date. Avoid benzodiazepines within 8 hours of opioids   HYDROcodone-acetaminophen (NORCO) 7.5-325 MG tablet    Sig: Take 1 tablet by mouth every 8 (eight) hours as needed for severe pain. Must last 30 days.    Dispense:  90 tablet    Refill:  0    Chronic Pain: STOP Act (Not applicable) Fill 1 day early if closed on refill date. Avoid benzodiazepines within 8 hours of opioids   I do not recommend Lyrica in addition to her gabapentin.  She states that she will discontinue Lyrica.  Continue with gabapentin as prescribed.  Follow-up for left L4-L5 ESI and left  cluneal nerve block.  Orders:  Orders Placed This Encounter  Procedures   CLUNEAL NERVE BLOCK    Standing Status:   Future    Standing Expiration Date:   01/14/2023    Scheduling Instructions:     Side: left superior and middle     Sedation: PO Valium     Timeframe: PRN Procedure. Patient will call.    Order Specific Question:   Where will this procedure be performed?    Answer:   [facility]   Lumbar Epidural Injection    Standing Status:   Future    Standing Expiration Date:   01/14/2023    Scheduling Instructions:     Procedure: Interlaminar Lumbar Epidural Steroid injection (LESI)            Laterality: Left L4/5     Sedation: PO Valium     Timeframe: ASAA    Order Specific Question:   Where will this procedure be performed?    Answer:   [facility]   Follow-up plan:   Return in about 20 days (around 11/03/2022) for Left L4/5 ESI + Left Superior and Middle Cluneal nerve block, in clinic (PO Valium).      Recent Visits Date Type Provider Dept  07/17/22 Office Visit Edward Jolly, MD [facility]  Showing recent visits within past 90 days and meeting all other requirements Today's Visits Date Type Provider Dept  10/14/22 Office Visit Edward Jolly, MD [facility]  Showing today's visits and meeting all other requirements Future Appointments Date Type Provider Dept  01/08/23 Appointment Edward Jolly, MD [facility]  Showing future appointments within next 90 days and meeting all other requirements  I discussed the assessment and treatment plan with the patient. The patient was provided an opportunity to ask questions and all were answered. The patient agreed with the plan and demonstrated an understanding of the instructions.  Patient advised to call back or seek an in-person evaluation if the symptoms or condition worsens.  Duration of encounter: .  Total time on encounter, as per AMA guidelines included both the  face-to-face and non-face-to-face time personally spent by the physician and/or other qualified health care professional(s) on the day of the encounter (includes time in activities that require the physician or other qualified health care professional and does not include time in activities normally performed by clinical staff). Physician's time may include the following activities when performed: Preparing  to see the patient (e.g., pre-charting review of records, searching for previously ordered imaging, lab work, and nerve conduction tests) Review of prior analgesic pharmacotherapies. Reviewing PMP Interpreting ordered tests (e.g., lab work, imaging, nerve conduction tests) Performing post-procedure evaluations, including interpretation of diagnostic procedures Obtaining and/or reviewing separately obtained history Performing a medically appropriate examination and/or evaluation Counseling and educating the patient/family/caregiver Ordering medications, tests, or procedures Referring and communicating with other health care professionals (when not separately reported) Documenting clinical information in the electronic or other health record Independently interpreting results (not separately reported) and communicating results to the patient/ family/caregiver Care coordination (not separately reported)  Note by: Edward Jolly, MD Date: 10/14/2022; Time: 3:23 PM

## 2022-10-14 NOTE — Progress Notes (Signed)
Nursing Pain Medication Assessment:  Safety precautions to be maintained throughout the outpatient stay will include: orient to surroundings, keep bed in low position, maintain call bell within reach at all times, provide assistance with transfer out of bed and ambulation.  Medication Inspection Compliance: Pill count conducted under aseptic conditions, in front of the patient. Neither the pills nor the bottle was removed from the patient's sight at any time. Once count was completed pills were immediately returned to the patient in their original bottle.  Medication: Hydrocodone/APAP Pill/Patch Count:  24 of 90 pills remain Pill/Patch Appearance: Markings consistent with prescribed medication Bottle Appearance: Standard pharmacy container. Clearly labeled. Filled Date: 07 / 29 / 2024 Last Medication intake:  Today

## 2022-10-14 NOTE — Patient Instructions (Addendum)
Moderate Conscious Sedation, Adult Sedation is the use of medicines to help you relax and not feel pain. Moderate conscious sedation is a type of sedation that makes you less alert than normal. You are still able to respond to instructions, touch, or both. This type of sedation is used during short medical and dental procedures. It is milder than deep sedation, which is a type of sedation you cannot be easily woken up from. It is also milder than general anesthesia, which is the use of medicines to make you fall asleep. Moderate conscious sedation lets you return to your normal activities sooner. Tell a health care provider about: Any allergies you have. All medicines you are taking, including vitamins, herbs, steroids, eye drops, creams, and over-the-counter medicines. Any problems you or family members have had with anesthesia. Any bleeding problems you have. Any surgeries you have had. Any medical conditions you have. Whether you are pregnant or may be pregnant. Any recent alcohol, tobacco, or drug use. What are the risks? Your health care provider will talk with you about risks. These may include: Oversedation. This is when you get too much medicine. Nausea or vomiting. Allergic reaction to medicines. Trouble breathing. If this happens, a breathing tube may be used. It will be removed when you can breathe better on your own. Heart trouble. Lung trouble. Emergence delirium. This is when you feel confused while the sedation wears off. This gets better with time. What happens before the procedure? When to stop eating and drinking Follow instructions from your health care provider about what you may eat and drink. These may include: 8 hours before your procedure Stop eating most foods. Do not eat meat, fried foods, or fatty foods. Eat only light foods, such as toast or crackers. All liquids are okay except energy drinks and alcohol. 6 hours before your procedure Stop eating. Drink only  clear liquids, such as water, clear fruit juice, black coffee, plain tea, and sports drinks. Do not drink energy drinks or alcohol. 2 hours before your procedure Stop drinking all liquids. You may be allowed to take medicines with small sips of water. If you do not follow your health care provider's instructions, your procedure may be delayed or canceled. Medicines Ask your health care provider about: Changing or stopping your regular medicines. These include any diabetes medicines or blood thinners you take. Taking medicines such as aspirin and ibuprofen. These medicines can thin your blood. Do not take them unless your health care provider tells you to. Taking over-the-counter medicines, vitamins, herbs, and supplements. Tests and exams You may have an exam or testing. You may have a blood or urine sample taken. General instructions Do not use any products that contain nicotine or tobacco for at least 4 weeks before the procedure. These products include cigarettes, chewing tobacco, and vaping devices, such as e-cigarettes. If you need help quitting, ask your health care provider. If you will be going home right after the procedure, plan to have a responsible adult: Take you home from the hospital or clinic. You will not be allowed to drive. Care for you for the time you are told. What happens during the procedure?  You will be given the sedative. It may be given: As a pill you can take by mouth. It can also be put into the rectum. As a spray through the nose. As an injection into muscle. As an injection into a vein through an IV. You may be given oxygen as needed. Your blood pressure, heart  rate, breathing rate, and blood oxygen level will be monitored during the procedure. The medical or dental procedure will be done. The procedure may vary among health care providers and hospitals. What happens after the procedure? Your blood pressure, heart rate, breathing rate, and blood oxygen  level will be monitored until you leave the hospital or clinic. You will get fluids through an IV as needed. Do not drive or operate machinery until your health care provider says that it is safe. This information is not intended to replace advice given to you by your health care provider. Make sure you discuss any questions you have with your health care provider. Document Revised: 08/26/2021 Document Reviewed: 08/26/2021 Elsevier Patient Education  2024 [employer] GENERAL RISKS AND COMPLICATIONS  What are the risk, side effects and possible complications? Generally speaking, most procedures are safe.  However, with any procedure there are risks, side effects, and the possibility of complications.  The risks and complications are dependent upon the sites that are lesioned, or the type of nerve block to be performed.  The closer the procedure is to the spine, the more serious the risks are.  Great care is taken when placing the radio frequency needles, block needles or lesioning probes, but sometimes complications can occur. Infection: Any time there is an injection through the skin, there is a risk of infection.  This is why sterile conditions are used for these blocks.  There are four possible types of infection. Localized skin infection. Central Nervous System Infection-This can be in the form of Meningitis, which can be deadly. Epidural Infections-This can be in the form of an epidural abscess, which can cause pressure inside of the spine, causing compression of the spinal cord with subsequent paralysis. This would require an emergency surgery to decompress, and there are no guarantees that the patient would recover from the paralysis. Discitis-This is an infection of the intervertebral discs.  It occurs in about 1% of discography procedures.  It is difficult to treat and it may lead to surgery.        2. Pain: the needles have to go through skin and soft tissues, will cause soreness.        3. Damage to internal structures:  The nerves to be lesioned may be near blood vessels or    other nerves which can be potentially damaged.       4. Bleeding: Bleeding is more common if the patient is taking blood thinners such as  aspirin, Coumadin, Ticiid, Plavix, etc., or if he/she have some genetic predisposition  such as hemophilia. Bleeding into the spinal canal can cause compression of the spinal  cord with subsequent paralysis.  This would require an emergency surgery to  decompress and there are no guarantees that the patient would recover from the  paralysis.       5. Pneumothorax:  Puncturing of a lung is a possibility, every time a needle is introduced in  the area of the chest or upper back.  Pneumothorax refers to free air around the  collapsed lung(s), inside of the thoracic cavity (chest cavity).  Another two possible  complications related to a similar event would include: Hemothorax and Chylothorax.   These are variations of the Pneumothorax, where instead of air around the collapsed  lung(s), you may have blood or chyle, respectively.       6. Spinal headaches: They may occur with any procedures in the area of the spine.  7. Persistent CSF (Cerebro-Spinal Fluid) leakage: This is a rare problem, but may occur  with prolonged intrathecal or epidural catheters either due to the formation of a fistulous  track or a dural tear.       8. Nerve damage: By working so close to the spinal cord, there is always a possibility of  nerve damage, which could be as serious as a permanent spinal cord injury with  paralysis.       9. Death:  Although rare, severe deadly allergic reactions known as "Anaphylactic  reaction" can occur to any of the medications used.      10. Worsening of the symptoms:  We can always make thing worse.  What are the chances of something like this happening? Chances of any of this occuring are extremely low.  By statistics, you have more of a chance of getting killed in a  motor vehicle accident: while driving to the hospital than any of the above occurring .  Nevertheless, you should be aware that they are possibilities.  In general, it is similar to taking a shower.  Everybody knows that you can slip, hit your head and get killed.  Does that mean that you should not shower again?  Nevertheless always keep in mind that statistics do not mean anything if you happen to be on the wrong side of them.  Even if a procedure has a 1 (one) in a 1,000,000 (million) chance of going wrong, it you happen to be that one..Also, keep in mind that by statistics, you have more of a chance of having something go wrong when taking medications.  Who should not have this procedure? If you are on a blood thinning medication (e.g. Coumadin, Plavix, see list of "Blood Thinners"), or if you have an active infection going on, you should not have the procedure.  If you are taking any blood thinners, please inform your physician.  How should I prepare for this procedure? Do not eat or drink anything at least six hours prior to the procedure. Bring a driver with you .  It cannot be a taxi. Come accompanied by an adult that can drive you back, and that is strong enough to help you if your legs get weak or numb from the local anesthetic. Take all of your medicines the morning of the procedure with just enough water to swallow them. If you have diabetes, make sure that you are scheduled to have your procedure done first thing in the morning, whenever possible. If you have diabetes, take only half of your insulin dose and notify our nurse that you have done so as soon as you arrive at the clinic. If you are diabetic, but only take blood sugar pills (oral hypoglycemic), then do not take them on the morning of your procedure.  You may take them after you have had the procedure. Do not take aspirin or any aspirin-containing medications, at least eleven (11) days prior to the procedure.  They may prolong  bleeding. Wear loose fitting clothing that may be easy to take off and that you would not mind if it got stained with Betadine or blood. Do not wear any jewelry or perfume Remove any nail coloring.  It will interfere with some of our monitoring equipment.  NOTE: Remember that this is not meant to be interpreted as a complete list of all possible complications.  Unforeseen problems may occur.  BLOOD THINNERS The following drugs contain aspirin or other products, which can cause  increased bleeding during surgery and should not be taken for 2 weeks prior to and 1 week after surgery.  If you should need take something for relief of minor pain, you may take acetaminophen which is found in Tylenol,m Datril, Anacin-3 and Panadol. It is not blood thinner. The products listed below are.  Do not take any of the products listed below in addition to any listed on your instruction sheet.  A.P.C or A.P.C with Codeine Codeine Phosphate Capsules #3 Ibuprofen Ridaura  ABC compound Congesprin Imuran rimadil  Advil Cope Indocin Robaxisal  Alka-Seltzer Effervescent Pain Reliever and Antacid Coricidin or Coricidin-D  Indomethacin Rufen  Alka-Seltzer plus Cold Medicine Cosprin Ketoprofen S-A-C Tablets  Anacin Analgesic Tablets or Capsules Coumadin Korlgesic Salflex  Anacin Extra Strength Analgesic tablets or capsules CP-2 Tablets Lanoril Salicylate  Anaprox Cuprimine Capsules Levenox Salocol  Anexsia-D Dalteparin Magan Salsalate  Anodynos Darvon compound Magnesium Salicylate Sine-off  Ansaid Dasin Capsules Magsal Sodium Salicylate  Anturane Depen Capsules Marnal Soma  APF Arthritis pain formula Dewitt's Pills Measurin Stanback  Argesic Dia-Gesic Meclofenamic Sulfinpyrazone  Arthritis Bayer Timed Release Aspirin Diclofenac Meclomen Sulindac  Arthritis pain formula Anacin Dicumarol Medipren Supac  Analgesic (Safety coated) Arthralgen Diffunasal Mefanamic Suprofen  Arthritis Strength Bufferin Dihydrocodeine Mepro  Compound Suprol  Arthropan liquid Dopirydamole Methcarbomol with Aspirin Synalgos  ASA tablets/Enseals Disalcid Micrainin Tagament  Ascriptin Doan's Midol Talwin  Ascriptin A/D Dolene Mobidin Tanderil  Ascriptin Extra Strength Dolobid Moblgesic Ticlid  Ascriptin with Codeine Doloprin or Doloprin with Codeine Momentum Tolectin  Asperbuf Duoprin Mono-gesic Trendar  Aspergum Duradyne Motrin or Motrin IB Triminicin  Aspirin plain, buffered or enteric coated Durasal Myochrisine Trigesic  Aspirin Suppositories Easprin Nalfon Trillsate  Aspirin with Codeine Ecotrin Regular or Extra Strength Naprosyn Uracel  Atromid-S Efficin Naproxen Ursinus  Auranofin Capsules Elmiron Neocylate Vanquish  Axotal Emagrin Norgesic Verin  Azathioprine Empirin or Empirin with Codeine Normiflo Vitamin E  Azolid Emprazil Nuprin Voltaren  Bayer Aspirin plain, buffered or children's or timed BC Tablets or powders Encaprin Orgaran Warfarin Sodium  Buff-a-Comp Enoxaparin Orudis Zorpin  Buff-a-Comp with Codeine Equegesic Os-Cal-Gesic   Buffaprin Excedrin plain, buffered or Extra Strength Oxalid   Bufferin Arthritis Strength Feldene Oxphenbutazone   Bufferin plain or Extra Strength Feldene Capsules Oxycodone with Aspirin   Bufferin with Codeine Fenoprofen Fenoprofen Pabalate or Pabalate-SF   Buffets II Flogesic Panagesic   Buffinol plain or Extra Strength Florinal or Florinal with Codeine Panwarfarin   Buf-Tabs Flurbiprofen Penicillamine   Butalbital Compound Four-way cold tablets Penicillin   Butazolidin Fragmin Pepto-Bismol   Carbenicillin Geminisyn Percodan   Carna Arthritis Reliever Geopen Persantine   Carprofen Gold's salt Persistin   Chloramphenicol Goody's Phenylbutazone   Chloromycetin Haltrain Piroxlcam   Clmetidine heparin Plaquenil   Cllnoril Hyco-pap Ponstel   Clofibrate Hydroxy chloroquine Propoxyphen         Before stopping any of these medications, be sure to consult the physician who ordered them.   Some, such as Coumadin (Warfarin) are ordered to prevent or treat serious conditions such as "deep thrombosis", "pumonary embolisms", and other heart problems.  The amount of time that you may need off of the medication may also vary with the medication and the reason for which you were taking it.  If you are taking any of these medications, please make sure you notify your pain physician before you undergo any procedures.         Epidural Steroid Injection Patient Information  Description: The epidural space surrounds the nerves as  they exit the spinal cord.  In some patients, the nerves can be compressed and inflamed by a bulging disc or a tight spinal canal (spinal stenosis).  By injecting steroids into the epidural space, we can bring irritated nerves into direct contact with a potentially helpful medication.  These steroids act directly on the irritated nerves and can reduce swelling and inflammation which often leads to decreased pain.  Epidural steroids may be injected anywhere along the spine and from the neck to the low back depending upon the location of your pain.   After numbing the skin with local anesthetic (like Novocaine), a small needle is passed into the epidural space slowly.  You may experience a sensation of pressure while this is being done.  The entire block usually last less than 10 minutes.  Conditions which may be treated by epidural steroids:  Low back and leg pain Neck and arm pain Spinal stenosis Post-laminectomy syndrome Herpes zoster (shingles) pain Pain from compression fractures  Preparation for the injection:  Do not eat any solid food or dairy products within 8 hours of your appointment.  You may drink clear liquids up to 3 hours before appointment.  Clear liquids include water, black coffee, juice or soda.  No milk or cream please. You may take your regular medication, including pain medications, with a sip of water before your appointment  Diabetics  should hold regular insulin (if taken separately) and take 1/2 normal NPH dos the morning of the procedure.  Carry some sugar containing items with you to your appointment. A driver must accompany you and be prepared to drive you home after your procedure.  Bring all your current medications with your. An IV may be inserted and sedation may be given at the discretion of the physician.   A blood pressure cuff, EKG and other monitors will often be applied during the procedure.  Some patients may need to have extra oxygen administered for a short period. You will be asked to provide medical information, including your allergies, prior to the procedure.  We must know immediately if you are taking blood thinners (like Coumadin/Warfarin)  Or if you are allergic to IV iodine contrast (dye). We must know if you could possible be pregnant.  Possible side-effects: Bleeding from needle site Infection (rare, may require surgery) Nerve injury (rare) Numbness & tingling (temporary) Difficulty urinating (rare, temporary) Spinal headache ( a headache worse with upright posture) Light -headedness (temporary) Pain at injection site (several days) Decreased blood pressure (temporary) Weakness in arm/leg (temporary) Pressure sensation in back/neck (temporary)  Call if you experience: Fever/chills associated with headache or increased back/neck pain. Headache worsened by an upright position. New onset weakness or numbness of an extremity below the injection site Hives or difficulty breathing (go to the emergency room) Inflammation or drainage at the infection site Severe back/neck pain Any new symptoms which are concerning to you  Please note:  Although the local anesthetic injected can often make your back or neck feel good for several hours after the injection, the pain will likely return.  It takes 3-7 days for steroids to work in the epidural space.  You may not notice any pain relief for at least that  one week.  If effective, we will often do a series of three injections spaced 3-6 weeks apart to maximally decrease your pain.  After the initial series, we generally will wait several months before considering a repeat injection of the same type.  If you have any  questions, please call [phone] [facility] Clinic[facility] Patient Information  Description: Specific nerve roots exit the spinal canal and these nerves can be compressed and inflamed by a bulging disc and bone spurs.  By injecting steroids on the nerve root, we can potentially decrease the inflammation surrounding these nerves, which often leads to decreased pain.  Also, by injecting local anesthesia on the nerve root, this can provide [place] helpful information to give to your referring doctor if it decreases your pain.  Selective nerve root blocks can be done along the spine from the neck to the low back depending on the location of your pain.   After numbing the skin with local anesthesia, a small needle is passed to the nerve root and the position of the needle is verified using x-ray pictures.  After the needle is in correct position, we then deposit the medication.  You may experience a pressure sensation while this is being done.  The entire block usually lasts less than 15 minutes.  Conditions that may be treated with selective nerve root blocks: Low back and leg pain Spinal stenosis Diagnostic block prior to potential surgery Neck and arm pain Post laminectomy syndrome  Preparation for the injection:  Do not eat any solid food or dairy products within 8 hours of your appointment. You may drink clear liquids up to 3 hours before an appointment.  Clear liquids include water, black coffee, juice or soda.  No milk or cream please. You may take your regular medications, including pain medications, with a sip of water before your appointment.  Diabetics should hold regular insulin (if  taken separately) and take 1/2 normal NPH dose the morning of the procedure.  Carry some sugar containing items with you to your appointment. A driver must accompany you and be prepared to drive you home after your procedure. Bring all your current medications with you. An IV may be inserted and sedation may be given at the discretion of the physician. A blood pressure cuff, EKG, and other monitors will often be applied during the procedure.  Some patients may need to have extra oxygen administered for a short period. You will be asked to provide medical information, including allergies, prior to the procedure.  We must know immediately if you are taking blood  Thinners (like Coumadin) or if you are allergic to IV iodine contrast (dye).  Possible side-effects: All are usually temporary Bleeding from needle site Light headedness Numbness and tingling Decreased blood pressure Weakness in arms/legs Pressure sensation in back/neck Pain at injection site (several days)  Possible complications: All are extremely rare Infection Nerve injury Spinal headache (a headache wore with upright position)  Call if you experience: Fever/chills associated with headache or increased back/neck pain Headache worsened by an upright position New onset weakness or numbness of an extremity below the injection site Hives or difficulty breathing (go to the emergency room) Inflammation or drainage at the injection site(s) Severe back/neck pain greater than usual New symptoms which are concerning to you  Please note:  Although the local anesthetic injected can often make your back or neck feel good for several hours after the injection the pain will likely return.  It takes 3-5 days for steroids to work on the nerve root. You may not notice any pain relief for at least one week.  If effective, we will often do a series of 3 injections spaced 3-6 weeks apart to maximally decrease your pain.  If you have  any questions, please call [phone] [facility]

## 2022-10-16 ENCOUNTER — Encounter: Payer: Medicare Other | Admitting: Student in an Organized Health Care Education/Training Program

## 2022-10-16 DIAGNOSIS — Z96652 Presence of left artificial knee joint: Secondary | ICD-10-CM | POA: Diagnosis not present

## 2022-10-20 DIAGNOSIS — R21 Rash and other nonspecific skin eruption: Secondary | ICD-10-CM | POA: Diagnosis not present

## 2022-10-20 DIAGNOSIS — L400 Psoriasis vulgaris: Secondary | ICD-10-CM | POA: Diagnosis not present

## 2022-10-20 DIAGNOSIS — L308 Other specified dermatitis: Secondary | ICD-10-CM | POA: Diagnosis not present

## 2022-10-20 DIAGNOSIS — Z79899 Other long term (current) drug therapy: Secondary | ICD-10-CM | POA: Diagnosis not present

## 2022-10-24 NOTE — Progress Notes (Signed)
Carelink Summary Report / Loop Recorder 

## 2022-10-26 ENCOUNTER — Other Ambulatory Visit: Payer: Self-pay

## 2022-10-26 ENCOUNTER — Emergency Department: Payer: Medicare Other

## 2022-10-26 ENCOUNTER — Emergency Department
Admission: EM | Admit: 2022-10-26 | Discharge: 2022-10-26 | Disposition: A | Discharge status: Home / Self Care | Payer: Medicare Other | Attending: Emergency Medicine | Admitting: Emergency Medicine

## 2022-10-26 DIAGNOSIS — I130 Hypertensive heart and chronic kidney disease with heart failure and stage 1 through stage 4 chronic kidney disease, or unspecified chronic kidney disease: Secondary | ICD-10-CM | POA: Diagnosis not present

## 2022-10-26 DIAGNOSIS — R002 Palpitations: Secondary | ICD-10-CM | POA: Diagnosis not present

## 2022-10-26 DIAGNOSIS — Z79899 Other long term (current) drug therapy: Secondary | ICD-10-CM | POA: Diagnosis not present

## 2022-10-26 DIAGNOSIS — D631 Anemia in chronic kidney disease: Secondary | ICD-10-CM | POA: Insufficient documentation

## 2022-10-26 DIAGNOSIS — Z7951 Long term (current) use of inhaled steroids: Secondary | ICD-10-CM | POA: Insufficient documentation

## 2022-10-26 DIAGNOSIS — E039 Hypothyroidism, unspecified: Secondary | ICD-10-CM | POA: Diagnosis not present

## 2022-10-26 DIAGNOSIS — R0789 Other chest pain: Secondary | ICD-10-CM | POA: Diagnosis not present

## 2022-10-26 DIAGNOSIS — R079 Chest pain, unspecified: Secondary | ICD-10-CM

## 2022-10-26 DIAGNOSIS — F4320 Adjustment disorder, unspecified: Secondary | ICD-10-CM | POA: Diagnosis not present

## 2022-10-26 DIAGNOSIS — F4321 Adjustment disorder with depressed mood: Secondary | ICD-10-CM

## 2022-10-26 DIAGNOSIS — I509 Heart failure, unspecified: Secondary | ICD-10-CM | POA: Diagnosis not present

## 2022-10-26 DIAGNOSIS — J45909 Unspecified asthma, uncomplicated: Secondary | ICD-10-CM | POA: Diagnosis not present

## 2022-10-26 DIAGNOSIS — N1832 Chronic kidney disease, stage 3b: Secondary | ICD-10-CM | POA: Diagnosis not present

## 2022-10-26 DIAGNOSIS — Z7952 Long term (current) use of systemic steroids: Secondary | ICD-10-CM | POA: Diagnosis not present

## 2022-10-26 DIAGNOSIS — Z96653 Presence of artificial knee joint, bilateral: Secondary | ICD-10-CM | POA: Insufficient documentation

## 2022-10-26 DIAGNOSIS — F4381 Prolonged grief disorder: Secondary | ICD-10-CM | POA: Insufficient documentation

## 2022-10-26 LAB — CBC
HCT: 33.4 % — ABNORMAL LOW (ref 36.0–46.0)
Hemoglobin: 10.4 g/dL — ABNORMAL LOW (ref 12.0–15.0)
MCH: 26.5 pg (ref 26.0–34.0)
MCHC: 31.1 g/dL (ref 30.0–36.0)
MCV: 85.2 fL (ref 80.0–100.0)
Platelets: 178 10*3/uL (ref 150–400)
RBC: 3.92 MIL/uL (ref 3.87–5.11)
RDW: 19.5 % — ABNORMAL HIGH (ref 11.5–15.5)
WBC: 7.7 10*3/uL (ref 4.0–10.5)
nRBC: 0 % (ref 0.0–0.2)

## 2022-10-26 LAB — BASIC METABOLIC PANEL
Anion gap: 9 (ref 5–15)
BUN: 30 mg/dL — ABNORMAL HIGH (ref 8–23)
CO2: 26 mmol/L (ref 22–32)
Calcium: 10.3 mg/dL (ref 8.9–10.3)
Chloride: 105 mmol/L (ref 98–111)
Creatinine, Ser: 1.71 mg/dL — ABNORMAL HIGH (ref 0.44–1.00)
GFR, Estimated: 31 mL/min — ABNORMAL LOW (ref >60)
Glucose, Bld: 106 mg/dL — ABNORMAL HIGH (ref 70–99)
Potassium: 3.5 mmol/L (ref 3.5–5.1)
Sodium: 140 mmol/L (ref 135–145)

## 2022-10-26 LAB — T4, FREE: Free T4: 1.03 ng/dL (ref 0.61–1.12)

## 2022-10-26 LAB — TROPONIN I (HIGH SENSITIVITY)
Troponin I (High Sensitivity): 11 ng/L (ref <18)
Troponin I (High Sensitivity): 13 ng/L (ref <18)

## 2022-10-26 LAB — MAGNESIUM: Magnesium: 2.1 mg/dL (ref 1.7–2.4)

## 2022-10-26 LAB — TSH: TSH: 0.232 u[IU]/mL — ABNORMAL LOW (ref 0.350–4.500)

## 2022-10-26 MED ORDER — ACETAMINOPHEN 500 MG PO TABS
1000.0000 mg | ORAL_TABLET | Freq: Once | ORAL | Status: AC
  Administered 2022-10-26: 1000 mg via ORAL

## 2022-10-26 MED ORDER — ACETAMINOPHEN 325 MG PO TABS
650.0000 mg | ORAL_TABLET | Freq: Once | ORAL | Status: AC
  Administered 2022-10-26: 650 mg via ORAL
  Filled 2022-10-26: qty 2

## 2022-10-26 NOTE — ED Triage Notes (Signed)
Pt to ED c/o chest pain and palpitations. Pt was started to feel this way after the notification that her granddaughter died. Pt reporting central CP that sometimes goes across her chest and dizziness associated with the CP. Denies SOB, fevers

## 2022-10-26 NOTE — ED Provider Notes (Signed)
Troponin 13 from 11.  Plan for discharge.  She has a headache, documented as receiving 1 g of Tylenol earlier this morning but patient is adamant that she did not receive this medication.  Will give a 650 mg dose and advised not to take any further doses today.  She will follow-up with PMD.   Pilar Jarvis, MD 10/26/22 [phone]

## 2022-10-26 NOTE — Discharge Instructions (Signed)
Please follow up with your PCP.  I suspect that your chest pain and headache today are secondary to a grief reaction.

## 2022-10-26 NOTE — ED Provider Notes (Signed)
[facility] Provider Note    Event Date/Time   First MD Initiated Contact with Patient 10/26/22 [phone]     (approximate)   History   Chest Pain   HPI  Michele Moore is a 72 y.o. female with history of CHF, chronic kidney disease, hypertension, hyperlipidemia, hypothyroidism who presents to the emergency department complaints of chest pain, palpitations and now headache.  Patient came to the emergency department to see her granddaughter who passed away in the emergency department.  When seen her granddaughter patient started becoming symptomatic.  No shortness of breath, fevers, cough, nausea, vomiting, diaphoresis or dizziness.  States chest pain is improving but now feeling like she is having a headache.   History provided by patient.    Past Medical History:  Diagnosis Date   (HFpEF) heart failure with preserved ejection fraction (HCC) 01/13/2014   a.) TTE 01/13/2014: EF 45%, apical HK, mod LVH, mild MAC, mild LAE/RVE, mod MR/TR/PR; b.) TTE 10/01/2018: EF 55-60%, mild MVH, mild LAE, triv MR/TR, G1DD   Allergy    ANA positive    Anemia of chronic renal failure, stage 3 (moderate), unspecified whether stage 3a or 3b CKD (HCC)    Aortic atherosclerosis (HCC)    Asthma    Cataract    Chronic pain syndrome    a.) followed by [facility] Cherylann Ratel, MD)   Chronic, continuous use of opioids    CKD (chronic kidney disease), stage III (HCC)    Complication of anesthesia    a.) anesthesia awareness/premature emergence during colonoscopy   DDD (degenerative disc disease), lumbar    Dyspnea on exertion    Dysrhythmia    IRREGULAR HEART BEAT   Family history of adverse reaction to anesthesia    sister difficult to put to sleep   GERD (gastroesophageal reflux disease)    Glaucoma    Gout    H/O tooth extraction    all lower teeth 1/19   Hemorrhoid    History of hiatal hernia    History of kidney stones    Hypertension    Hypothyroidism     Implantable loop recorder present 08/22/2021   a.) s/p implantation of [employer] LINQ Reveal ILR; serial #: [id number]   Long term current use of immunosuppressive drug    a.) MTX + apremilast   Migraines    Mixed hyperlipidemia    Multiple gastric ulcers    Osteoarthritis    Psoriasis    Psoriatic arthritis (HCC)    a.) on apremilast   Rheumatoid arthritis (HCC)    a.) on MTX   SS-A antibody positive    Wears dentures    partial upper and lower    Past Surgical History:  Procedure Laterality Date   ANKLE SURGERY Right    CARPAL TUNNEL RELEASE     x3   COLONOSCOPY  2000?   COLONOSCOPY WITH PROPOFOL N/A 10/22/2017   Procedure: COLONOSCOPY WITH PROPOFOL;  Surgeon: Midge Minium, MD;  Location: [facility];  Service: Endoscopy;  Laterality: N/A;   COLONOSCOPY WITH PROPOFOL N/A 08/14/2022   Procedure: COLONOSCOPY WITH PROPOFOL;  Surgeon: Midge Minium, MD;  Location: [facility];  Service: Endoscopy;  Laterality: N/A;   ESOPHAGOGASTRODUODENOSCOPY (EGD) WITH PROPOFOL N/A 10/22/2017   Procedure: ESOPHAGOGASTRODUODENOSCOPY (EGD) WITH PROPOFOL;  Surgeon: Midge Minium, MD;  Location: [facility];  Service: Endoscopy;  Laterality: N/A;   ESOPHAGOGASTRODUODENOSCOPY (EGD) WITH PROPOFOL N/A 08/14/2022   Procedure: ESOPHAGOGASTRODUODENOSCOPY (EGD) WITH PROPOFOL;  Surgeon: Midge Minium,  MD;  Location: [facility] ENDOSCOPY;  Service: Endoscopy;  Laterality: N/A;   FUSION OF TALONAVICULAR JOINT Right 08/03/2015   Procedure: TAILOR NAVICULAR JOINT FUSION - RIGHT ;  Surgeon: Gwyneth Revels, DPM;  Location: [facility] ORS;  Service: Podiatry;  Laterality: Right;   HOT HEMOSTASIS  08/14/2022   Procedure: HOT HEMOSTASIS (ARGON PLASMA COAGULATION/BICAP);  Surgeon: Midge Minium, MD;  Location: [facility] END[facility];  Service: Endoscopy;;   JOINT REPLACEMENT Right    knee x 3 ,right x1 and left x2   LOOP RECORDER IMPLANT  08/22/2021   Procedure: LOOP RECORDER IMPLANT; Location: [facility]; Surgeon: Sherryl Manges,  MD   POLYPECTOMY N/A 10/22/2017   Procedure: POLYPECTOMY;  Surgeon: Midge Minium, MD;  Location: [facility];  Service: Endoscopy;  Laterality: N/A;   POLYPECTOMY  08/14/2022   Procedure: POLYPECTOMY;  Surgeon: Midge Minium, MD;  Location: [facility] ENDOSCOPY;  Service: Endoscopy;;   ROTATOR CUFF REPAIR Right 1995   TONSILLECTOMY     TOTAL KNEE REVISION Right 01/20/2018   Procedure: POLYETHYLENE EXCHANGE;  Surgeon: Donato Heinz, MD;  Location: [facility] ORS;  Service: Orthopedics;  Laterality: Right;   TOTAL KNEE REVISION Left 10/09/2021   Procedure: LEFT TOTAL KNEE REVISION WITH POLYETHYLENE EXCHANGE.;  Surgeon: Donato Heinz, MD;  Location: [facility] ORS;  Service: Orthopedics;  Laterality: Left;   TUBAL LIGATION     UPPER GI ENDOSCOPY  2000?    MEDICATIONS:  Prior to Admission medications   Medication Sig Start Date End Date Taking? Authorizing Provider  acetaminophen (TYLENOL) 650 MG CR tablet Take 650 mg by mouth every 8 (eight) hours as needed for pain.    [provider]  albuterol (VENTOLIN HFA) 108 (90 Base) MCG/ACT inhaler Inhale 2 puffs into the lungs every 6 (six) hours as needed for wheezing or shortness of breath. 06/04/21   Phineas Semen, MD  allopurinol (ZYLOPRIM) 100 MG tablet Take 200 mg by mouth every morning.  08/06/17   [provider]  betamethasone dipropionate 0.05 % cream APPLY TO PSORIASIS ON HANDS TWICE DAILY ONLY. AVOID FACE, GROIN AND AXILLA 05/30/19   Deirdre Evener, MD  calcipotriene (DOVONOX) 0.005 % cream APPLY TOPICALLY TO PSORIASIS AREAS ON LEGS AND FEET ONCE OR TWICE DAILY 03/28/21   Deirdre Evener, MD  Cholecalciferol (VITAMIN D3) 2000 units TABS Take 2,000 Units by mouth daily.    [provider]  clobetasol ointment (TEMOVATE) 0.05 % Apply topically 2 (two) times daily. 05/28/21   [provider]  diclofenac sodium (VOLTAREN) 1 % GEL Apply 2 g topically 3 (three) times daily.  08/11/17   [provider]  FARXIGA  10 MG TABS tablet Take 10 mg by mouth every morning. 07/15/21   [provider]  furosemide (LASIX) 40 MG tablet Take 40 mg by mouth daily. 07/15/21   [provider]  gabapentin (NEURONTIN) 600 MG tablet Take 1 tablet (600 mg total) by mouth at bedtime. 07/17/22 01/13/23  Edward Jolly, MD  HYDROcodone-acetaminophen (NORCO) 7.5-325 MG tablet Take 1 tablet by mouth every 8 (eight) hours as needed for severe pain. Must last 30 days. 10/21/22 11/20/22  Edward Jolly, MD  HYDROcodone-acetaminophen (NORCO) 7.5-325 MG tablet Take 1 tablet by mouth every 8 (eight) hours as needed for severe pain. Must last 30 days. 11/20/22 12/20/22  Edward Jolly, MD  HYDROcodone-acetaminophen (NORCO) 7.5-325 MG tablet Take 1 tablet by mouth every 8 (eight) hours as needed for severe pain. Must last 30 days. 12/20/22 01/19/23  Edward Jolly, MD  ketoconazole (  NIZORAL) 2 % cream Apply topically daily as needed. 03/19/21   Duanne Limerick, MD  levothyroxine (SYNTHROID) 137 MCG tablet Take 1 tablet (137 mcg total) by mouth daily. 09/26/22   Duanne Limerick, MD  losartan (COZAAR) 25 MG tablet Take 25 mg by mouth daily. 12/18/21   [provider]  meclizine (ANTIVERT) 12.5 MG tablet TAKE 1 TABLET(12.5 MG) BY MOUTH THREE TIMES DAILY AS NEEDED FOR DIZZINESS 09/25/22   Duanne Limerick, MD  metoprolol tartrate (LOPRESSOR) 25 MG tablet Take 1 tablet (25 mg total) by mouth 2 (two) times daily. 01/24/22   Duke Salvia, MD  montelukast (SINGULAIR) 10 MG tablet TAKE 1 TABLET(10 MG) BY MOUTH AT BEDTIME 09/25/22   Duanne Limerick, MD  Multiple Vitamins-Calcium (ONE-A-DAY WOMENS FORMULA PO) Take 1 tablet by mouth daily.    [provider]  mupirocin ointment (BACTROBAN) 2 % Apply 1 application topically 2 (two) times daily. 12/08/19   Duanne Limerick, MD  nystatin (MYCOSTATIN) 100000 UNIT/ML suspension Take 5 mLs (500,000 Units total) by mouth 4 (four) times daily. 08/04/22   Rickard Patience, MD  nystatin cream  (MYCOSTATIN) Apply 1 Application topically 2 (two) times daily. 09/16/21   Duanne Limerick, MD  omega-3 acid ethyl esters (LOVAZA) 1 g capsule Take 1 capsule (1 g total) by mouth 2 (two) times daily. 09/25/22   Duanne Limerick, MD  omeprazole (PRILOSEC) 40 MG capsule TAKE 1 CAPSULE(40 MG) BY MOUTH DAILY 09/25/22   Duanne Limerick, MD  OTEZLA 30 MG TABS Take 1 tablet by mouth daily. 11/12/20   [provider]  Polyethyl Glycol-Propyl Glycol (SYSTANE OP) Place 1 drop into both eyes daily as needed (dry eyes).    [provider]  potassium chloride SA (KLOR-CON M) 20 MEQ tablet TAKE 1 TABLET(20 MEQ) BY MOUTH DAILY 10/14/22   Duanne Limerick, MD  predniSONE (STERAPRED UNI-PAK 21 TAB) 10 MG (21) TBPK tablet Day 1 take 6 tablets, Day 2 take 5 tablets, Day 3 take 4 tablets, Day 4 take 3 tablets, Day 5 take 2 tablets D6 take 1 tablet Patient not taking: Reported on 10/14/2022 08/04/22   Rickard Patience, MD  tiZANidine (ZANAFLEX) 4 MG tablet Take 1 tablet (4 mg total) by mouth at bedtime. 07/17/22 07/12/23  Edward Jolly, MD  triamcinolone (KENALOG) 0.1 % Apply 1 application topically 2 (two) times daily. 03/22/20   Duanne Limerick, MD  fluticasone (FLONASE) 50 MCG/ACT nasal spray SHAKE LIQUID AND USE 1 SPRAY IN [facility] NOSTRIL DAILY 02/28/19 04/24/19  Duanne Limerick, MD  mometasone (NASONEX) 50 MCG/ACT nasal spray Place 2 sprays into the nose daily. 04/12/19 04/24/19  Duanne Limerick, MD    Physical Exam   Triage Vital Signs: ED Triage Vitals  Encounter Vitals Group     BP 10/26/22 0231 (!) 168/89     Systolic BP Percentile --      Diastolic BP Percentile --      Pulse Rate 10/26/22 0231 93     Resp 10/26/22 0231 18     Temp 10/26/22 0231 99.3 F (37.4 C)     Temp Source 10/26/22 0231 Oral     SpO2 10/26/22 0231 96 %     Weight 10/26/22 0232 236 lb (107 kg)     Height 10/26/22 0232 5\' 11"  (1.803 m)     Head Circumference --      Peak Flow --      Pain Score 10/26/22 0232 8  Pain Loc --      Pain  Education --      Exclude from Growth Chart --     Most recent vital signs: Vitals:   10/26/22 0231 10/26/22 0600  BP: (!) 168/89 125/60  Pulse: 93 (!) 50  Resp: 18 20  Temp: 99.3 F (37.4 C)   SpO2: 96% 99%    CONSTITUTIONAL: Alert, responds appropriately to questions. Well-appearing; well-nourished, elderly, pleasant HEAD: Normocephalic, atraumatic EYES: Conjunctivae clear, pupils appear equal, sclera nonicteric ENT: normal nose; moist mucous membranes NECK: Supple, normal ROM CARD: RRR; S1 and S2 appreciated RESP: Normal chest excursion without splinting or tachypnea; breath sounds clear and equal bilaterally; no wheezes, no rhonchi, no rales, no hypoxia or respiratory distress, speaking full sentences ABD/GI: Non-distended; soft, non-tender, no rebound, no guarding, no peritoneal signs BACK: The back appears normal EXT: Normal ROM in all joints; no deformity noted, no edema, no calf tenderness or calf swelling SKIN: Normal color for age and race; warm; no rash on exposed skin NEURO: Moves all extremities equally, normal speech, no facial asymmetry PSYCH: The patient's mood and manner are appropriate.   ED Results / Procedures / Treatments   LABS: (all labs ordered are listed, but only abnormal results are displayed) Labs Reviewed  BASIC METABOLIC PANEL - Abnormal; Notable for the following components:      Result Value   Glucose, Bld 106 (*)    BUN 30 (*)    Creatinine, Ser 1.71 (*)    GFR, Estimated 31 (*)    All other components within normal limits  CBC - Abnormal; Notable for the following components:   Hemoglobin 10.4 (*)    HCT 33.4 (*)    RDW 19.5 (*)    All other components within normal limits  TSH - Abnormal; Notable for the following components:   TSH 0.232 (*)    All other components within normal limits  MAGNESIUM  T4, FREE  TROPONIN I (HIGH SENSITIVITY)  TROPONIN I (HIGH SENSITIVITY)     EKG:  EKG Interpretation Date/Time:  Sunday October 26 2022 02:38:01 EDT Ventricular Rate:  94 PR Interval:  178 QRS Duration:  78 QT Interval:  306 QTC Calculation: 382 R Axis:   -26  Text Interpretation: Normal sinus rhythm Otherwise normal ECG When compared with ECG of 04-Jun-2021 22:41, PREVIOUS ECG IS PRESENT Confirmed by Rochele Raring [phone]) on 10/26/2022 5:35:35 AM         RADIOLOGY: My personal review and interpretation of imaging: Chest x-ray clear.  I have personally reviewed all radiology reports.   DG Chest 2 View  Result Date: 10/26/2022 CLINICAL DATA:  Chest pain, palpitations EXAM: CHEST - 2 VIEW COMPARISON:  06/04/2021 FINDINGS: Heart and mediastinal contours are within normal limits. No focal opacities or effusions. No acute bony abnormality. Loop recorder device projects over the left chest. IMPRESSION: No active cardiopulmonary disease. Electronically Signed   By: Charlett Nose M.D.   On: 10/26/2022 03:36     PROCEDURES:  Critical Care performed: No     .1-3 Lead EKG Interpretation  Performed by: Grenda Lora, Layla Maw, DO Authorized by: Kendel Pesnell, Layla Maw, DO     Interpretation: normal     ECG rate:  93   ECG rate assessment: normal     Rhythm: sinus rhythm     Ectopy: none     Conduction: normal       IMPRESSION / MDM / ASSESSMENT AND PLAN / ED COURSE  I reviewed the  triage vital signs and the nursing notes.    Patient here with chest pain, palpitations and headache after receiving her granddaughter who passed away in the emergency department.  The patient is on the cardiac monitor to evaluate for evidence of arrhythmia and/or significant heart rate changes.   DIFFERENTIAL DIAGNOSIS (includes but not limited to):   Suspect grief reaction, anxiety, less likely arrhythmia, ACS, PE or dissection, CHF exacerbation   Patient's presentation is most consistent with acute presentation with potential threat to life or bodily function.   PLAN: EKG nonischemic.  Labs show stable anemia that has actually  improved compared to previous.  Stable chronic kidney disease.  First troponin negative.  Second pending.  Chest x-ray reviewed and interpreted by myself and the radiologist and shows no acute abnormality.  Currently in a normal sinus rhythm.  Complaining of only minimal chest pain at this time but now having mild headache as well.  Will give Tylenol and allow her to have something to eat and drink.  Low suspicion for intracranial hemorrhage, stroke, meningitis.   MEDICATIONS GIVEN IN ED: Medications  acetaminophen (TYLENOL) tablet 1,000 mg (1,000 mg Oral Given 10/26/22 6962)     ED COURSE: No evidence seen on cardiac monitoring.  TSH is low but normal free T4.  Normal magnesium.  Second troponin pending.  If negative, will discharge home.  Patient comfortable with this plan will follow-up with her outpatient providers.      CONSULTS: Pending repeat troponin.   OUTSIDE RECORDS REVIEWED: Reviewed last cardiology note on 08/21/2022.       FINAL CLINICAL IMPRESSION(S) / ED DIAGNOSES   Final diagnoses:  Grief reaction  Nonspecific chest pain     Rx / DC Orders   ED Discharge Orders     None        Note:  This document was prepared using Dragon voice recognition software and may include unintentional dictation errors.   Tyjae Shvartsman, Layla Maw, DO 10/26/22 [phone]

## 2022-10-28 ENCOUNTER — Telehealth: Payer: Self-pay

## 2022-10-28 NOTE — Transitions of Care (Post Inpatient/ED Visit) (Signed)
   10/28/2022  Name: DORANNA CZERWONKA MRN: [id number] DOB: 03-Sep-1950  Today's TOC FU Call Status: Today's TOC FU Call Status:: Unsuccessful Call (1st Attempt) Unsuccessful Call (1st Attempt) Date: 10/28/22  Attempted to reach the patient regarding the most recent Inpatient/ED visit.  Follow Up Plan: Additional outreach attempts will be made to reach the patient to complete the Transitions of Care (Post Inpatient/ED visit) call.   Signature Arthur Holms

## 2022-10-29 ENCOUNTER — Telehealth: Payer: Self-pay

## 2022-10-29 NOTE — Transitions of Care (Post Inpatient/ED Visit) (Signed)
10/29/2022  Name: Michele Moore MRN: [id number] DOB: 1950-09-08  Today's TOC FU Call Status: Today's TOC FU Call Status:: Successful TOC FU Call Completed TOC FU Call Complete Date: 10/29/22 Patient's Name and Date of Birth confirmed.  Transition Care Management Follow-up Telephone Call Date of Discharge: 10/26/22 Discharge Facility[facility]) Type of Discharge: Emergency Department Reason for ED Visit:  (anxiety) How have you been since you were released from the hospital?: Same Any questions or concerns?: No  Items Reviewed: Did you receive and understand the discharge instructions provided?: Yes Medications obtained,verified, and reconciled?: Yes (Medications Reviewed) Any new allergies since your discharge?: No Dietary orders reviewed?: NA Do you have support at home?: Yes People in Home: child(ren), adult Name of Support/Comfort Primary Source: Michele Moore lives near  Medications Reviewed Today: Medications Reviewed Today     Reviewed by Michele Moore (Medical Assistant) on 10/29/22 at 1109  Med List Status: <None>   Medication Order Taking? Sig Documenting Provider Last Dose Status Informant  acetaminophen (TYLENOL) 650 MG CR tablet [id number] Yes Take 650 mg by mouth every 8 (eight) hours as needed for pain. [provider] Taking Active Self  albuterol (VENTOLIN HFA) 108 (90 Base) MCG/ACT inhaler [id number] Yes Inhale 2 puffs into the lungs every 6 (six) hours as needed for wheezing or shortness of breath. Phineas Semen, MD Taking Active Self  allopurinol (ZYLOPRIM) 100 MG tablet [id number] Yes Take 200 mg by mouth every morning.  [provider] Taking Active Self  betamethasone dipropionate 0.05 % cream [id number] Yes APPLY TO PSORIASIS ON HANDS TWICE DAILY ONLY. AVOID FACE, GROIN AND AXILLA Deirdre Evener, MD Taking Active Self  calcipotriene (DOVONOX) 0.005 % cream [id number] Yes APPLY TOPICALLY TO PSORIASIS AREAS ON LEGS AND  FEET ONCE OR TWICE DAILY Deirdre Evener, MD Taking Active Self  Cholecalciferol (VITAMIN D3) 2000 units TABS [id number] Yes Take 2,000 Units by mouth daily. [provider] Taking Active Self  clobetasol ointment (TEMOVATE) 0.05 % [id number] Yes Apply topically 2 (two) times daily. [provider] Taking Active Self  diclofenac sodium (VOLTAREN) 1 % GEL [id number] Yes Apply 2 g topically 3 (three) times daily.  [provider] Taking Active Self  FARXIGA 10 MG TABS tablet [id number] Yes Take 10 mg by mouth every morning. [provider] Taking Active Self    Discontinued 04/24/19 1340   furosemide (LASIX) 40 MG tablet [id number] Yes Take 40 mg by mouth daily. [provider] Taking Active Self  gabapentin (NEURONTIN) 600 MG tablet [id number] Yes Take 1 tablet (600 mg total) by mouth at bedtime. Edward Jolly, MD Taking Active   HYDROcodone-acetaminophen (NORCO) 7.5-325 MG tablet [id number] Yes Take 1 tablet by mouth every 8 (eight) hours as needed for severe pain. Must last 30 days. Edward Jolly, MD Taking Active   HYDROcodone-acetaminophen (NORCO) 7.5-325 MG tablet [id number] Yes Take 1 tablet by mouth every 8 (eight) hours as needed for severe pain. Must last 30 days. Edward Jolly, MD Taking Active   HYDROcodone-acetaminophen (NORCO) 7.5-325 MG tablet [id number] Yes Take 1 tablet by mouth every 8 (eight) hours as needed for severe pain. Must last 30 days. Edward Jolly, MD Taking Active   ketoconazole (NIZORAL) 2 % cream [id number] Yes Apply topically daily as needed. Duanne Limerick, MD Taking Active Self  levothyroxine (SYNTHROID) 137 MCG tablet [id number] Yes Take 1 tablet (137 mcg total) by mouth daily. Duanne Limerick, MD Taking Active   losartan (COZAAR) 25 MG  tablet [id number] Yes Take 25 mg by mouth daily. [provider] Taking Active Self  meclizine (ANTIVERT) 12.5 MG tablet [id number] Yes TAKE 1 TABLET(12.5 MG) BY MOUTH THREE TIMES DAILY AS  NEEDED FOR DIZZINESS Duanne Limerick, MD Taking Active   metoprolol tartrate (LOPRESSOR) 25 MG tablet [id number] Yes Take 1 tablet (25 mg total) by mouth 2 (two) times daily. Duke Salvia, MD Taking Active     Discontinued 04/24/19 1340   montelukast (SINGULAIR) 10 MG tablet [id number] Yes TAKE 1 TABLET(10 MG) BY MOUTH AT BEDTIME Duanne Limerick, MD Taking Active   Multiple Vitamins-Calcium (ONE-A-DAY WOMENS FORMULA PO) [id number] Yes Take 1 tablet by mouth daily. [provider] Taking Active Self  mupirocin ointment (BACTROBAN) 2 % [id number] Yes Apply 1 application topically 2 (two) times daily. Duanne Limerick, MD Taking Active Self  nystatin (MYCOSTATIN) 100000 UNIT/ML suspension [id number] Yes Take 5 mLs (500,000 Units total) by mouth 4 (four) times daily. Rickard Patience, MD Taking Active   nystatin cream Ambrose Pancoast) [id number] Yes Apply 1 Application topically 2 (two) times daily. Duanne Limerick, MD Taking Active Self  omega-3 acid ethyl esters (LOVAZA) 1 g capsule [id number] Yes Take 1 capsule (1 g total) by mouth 2 (two) times daily. Duanne Limerick, MD Taking Active   omeprazole (PRILOSEC) 40 MG capsule [id number] Yes TAKE 1 CAPSULE(40 MG) BY MOUTH DAILY Duanne Limerick, MD Taking Active   OTEZLA 30 MG TABS [id number] Yes Take 1 tablet by mouth daily. [provider] Taking Active Self  Polyethyl Glycol-Propyl Glycol (SYSTANE OP) [id number] Yes Place 1 drop into both eyes daily as needed (dry eyes). [provider] Taking Active Self  potassium chloride SA (KLOR-CON M) 20 MEQ tablet [id number] Yes TAKE 1 TABLET(20 MEQ) BY MOUTH DAILY Duanne Limerick, MD Taking Active   predniSONE (STERAPRED UNI-PAK 21 TAB) 10 MG (21) TBPK tablet [id number] No Day 1 take 6 tablets, Day 2 take 5 tablets, Day 3 take 4 tablets, Day 4 take 3 tablets, Day 5 take 2 tablets D6 take 1 tablet  Patient not taking: Reported on 10/14/2022   Rickard Patience, MD Not Taking Active   tiZANidine (ZANAFLEX) 4 MG tablet  [id number] Yes Take 1 tablet (4 mg total) by mouth at bedtime. Edward Jolly, MD Taking Active   triamcinolone (KENALOG) 0.1 % [id number] Yes Apply 1 application topically 2 (two) times daily. Duanne Limerick, MD Taking Active Self  Med List Note Nonah Mattes, RN 10/14/22 1514): UDS 07/17/2022 MR 01/19/23 Medication agreement signed 10/28/2016            Home Care and Equipment/Supplies: Were Home Health Services Ordered?: NA Any new equipment or medical supplies ordered?: NA  Functional Questionnaire: Do you need assistance with bathing/showering or dressing?: No Do you need assistance with meal preparation?: No Do you need assistance with eating?: No Do you have difficulty maintaining continence: No Do you need assistance with getting out of bed/getting out of a chair/moving?: No Do you have difficulty managing or taking your medications?: No  Follow up appointments reviewed: PCP Follow-up appointment confirmed?: NA (pt declined appt- death in family) [facility]ist [facility] Follow-up appointment confirmed?: NA Do you need transportation to your follow-up appointment?: No Do you understand care options if your condition(s) worsen?: Yes-patient verbalized understanding    SIGNATURE Arthur Holms

## 2022-10-30 ENCOUNTER — Other Ambulatory Visit: Payer: Medicare Other

## 2022-10-30 ENCOUNTER — Ambulatory Visit: Payer: Medicare Other

## 2022-10-30 ENCOUNTER — Inpatient Hospital Stay: Payer: Medicare Other

## 2022-10-30 ENCOUNTER — Inpatient Hospital Stay: Payer: Medicare Other | Attending: Oncology

## 2022-10-30 DIAGNOSIS — D649 Anemia, unspecified: Secondary | ICD-10-CM

## 2022-10-30 DIAGNOSIS — N1832 Chronic kidney disease, stage 3b: Secondary | ICD-10-CM | POA: Diagnosis not present

## 2022-10-30 DIAGNOSIS — N183 Chronic kidney disease, stage 3 unspecified: Secondary | ICD-10-CM

## 2022-10-30 DIAGNOSIS — D631 Anemia in chronic kidney disease: Secondary | ICD-10-CM | POA: Diagnosis not present

## 2022-10-30 LAB — SAMPLE TO BLOOD BANK

## 2022-10-30 LAB — HEMOGLOBIN AND HEMATOCRIT, BLOOD
HCT: 32.1 % — ABNORMAL LOW (ref 36.0–46.0)
Hemoglobin: 10 g/dL — ABNORMAL LOW (ref 12.0–15.0)

## 2022-10-30 MED ORDER — SODIUM CHLORIDE 0.9 % IV SOLN: 200.0000 mg | Freq: Once | INTRAVENOUS | Status: DC

## 2022-10-30 MED ORDER — EPOETIN ALFA-EPBX 20000 UNIT/ML IJ SOLN
20000.0000 [IU] | Freq: Once | INTRAMUSCULAR | Status: AC
  Administered 2022-10-30: 20000 [IU] via SUBCUTANEOUS
  Filled 2022-10-30: qty 1

## 2022-11-13 ENCOUNTER — Other Ambulatory Visit: Payer: Self-pay | Admitting: Internal Medicine

## 2022-11-17 ENCOUNTER — Ambulatory Visit (INDEPENDENT_AMBULATORY_CARE_PROVIDER_SITE_OTHER): Payer: Medicare Other

## 2022-11-17 DIAGNOSIS — R002 Palpitations: Secondary | ICD-10-CM | POA: Diagnosis not present

## 2022-11-18 LAB — CUP PACEART REMOTE DEVICE CHECK
Date Time Interrogation Session: 20240918230629
Implantable Pulse Generator Implant Date: 20230629

## 2022-11-21 ENCOUNTER — Other Ambulatory Visit: Payer: Self-pay | Admitting: Family Medicine

## 2022-11-21 DIAGNOSIS — E039 Hypothyroidism, unspecified: Secondary | ICD-10-CM

## 2022-11-27 DIAGNOSIS — L2089 Other atopic dermatitis: Secondary | ICD-10-CM | POA: Diagnosis not present

## 2022-11-27 DIAGNOSIS — L309 Dermatitis, unspecified: Secondary | ICD-10-CM | POA: Diagnosis not present

## 2022-12-01 ENCOUNTER — Other Ambulatory Visit: Payer: Medicare Other

## 2022-12-01 ENCOUNTER — Other Ambulatory Visit: Payer: Self-pay

## 2022-12-01 ENCOUNTER — Inpatient Hospital Stay: Payer: Medicare Other | Attending: Oncology

## 2022-12-01 DIAGNOSIS — N1832 Chronic kidney disease, stage 3b: Secondary | ICD-10-CM | POA: Diagnosis not present

## 2022-12-01 DIAGNOSIS — D649 Anemia, unspecified: Secondary | ICD-10-CM

## 2022-12-01 DIAGNOSIS — Z23 Encounter for immunization: Secondary | ICD-10-CM | POA: Diagnosis not present

## 2022-12-01 DIAGNOSIS — N183 Chronic kidney disease, stage 3 unspecified: Secondary | ICD-10-CM

## 2022-12-01 DIAGNOSIS — D631 Anemia in chronic kidney disease: Secondary | ICD-10-CM | POA: Diagnosis not present

## 2022-12-01 LAB — CBC WITH DIFFERENTIAL (CANCER CENTER ONLY)
Abs Immature Granulocytes: 0.04 10*3/uL (ref 0.00–0.07)
Basophils Absolute: 0.1 10*3/uL (ref 0.0–0.1)
Basophils Relative: 1 %
Eosinophils Absolute: 0.3 10*3/uL (ref 0.0–0.5)
Eosinophils Relative: 4 %
HCT: 31.9 % — ABNORMAL LOW (ref 36.0–46.0)
Hemoglobin: 10 g/dL — ABNORMAL LOW (ref 12.0–15.0)
Immature Granulocytes: 1 %
Lymphocytes Relative: 30 %
Lymphs Abs: 1.9 10*3/uL (ref 0.7–4.0)
MCH: 27.5 pg (ref 26.0–34.0)
MCHC: 31.3 g/dL (ref 30.0–36.0)
MCV: 87.9 fL (ref 80.0–100.0)
Monocytes Absolute: 0.4 10*3/uL (ref 0.1–1.0)
Monocytes Relative: 7 %
Neutro Abs: 3.5 10*3/uL (ref 1.7–7.7)
Neutrophils Relative %: 57 %
Platelet Count: 214 10*3/uL (ref 150–400)
RBC: 3.63 MIL/uL — ABNORMAL LOW (ref 3.87–5.11)
RDW: 17.5 % — ABNORMAL HIGH (ref 11.5–15.5)
WBC Count: 6.1 10*3/uL (ref 4.0–10.5)
nRBC: 0 % (ref 0.0–0.2)

## 2022-12-01 LAB — IRON AND TIBC
Iron: 85 ug/dL (ref 28–170)
Saturation Ratios: 24 % (ref 10.4–31.8)
TIBC: 351 ug/dL (ref 250–450)
UIBC: 266 ug/dL

## 2022-12-01 LAB — FERRITIN: Ferritin: 30 ng/mL (ref 11–307)

## 2022-12-01 LAB — SAMPLE TO BLOOD BANK

## 2022-12-01 LAB — RETIC PANEL
Immature Retic Fract: 14.1 % (ref 2.3–15.9)
RBC.: 3.66 MIL/uL — ABNORMAL LOW (ref 3.87–5.11)
Retic Count, Absolute: 82 10*3/uL (ref 19.0–186.0)
Retic Ct Pct: 2.2 % (ref 0.4–3.1)
Reticulocyte Hemoglobin: 28.5 pg (ref >27.9)

## 2022-12-01 NOTE — Progress Notes (Signed)
Carelink Summary Report / Loop Recorder 

## 2022-12-03 ENCOUNTER — Ambulatory Visit: Payer: Medicare Other

## 2022-12-03 ENCOUNTER — Inpatient Hospital Stay (HOSPITAL_BASED_OUTPATIENT_CLINIC_OR_DEPARTMENT_OTHER): Payer: Medicare Other | Admitting: Oncology

## 2022-12-03 ENCOUNTER — Inpatient Hospital Stay: Payer: Medicare Other

## 2022-12-03 ENCOUNTER — Encounter: Payer: Self-pay | Admitting: Oncology

## 2022-12-03 ENCOUNTER — Ambulatory Visit: Payer: Medicare Other | Admitting: Oncology

## 2022-12-03 ENCOUNTER — Other Ambulatory Visit: Payer: Self-pay | Admitting: Oncology

## 2022-12-03 VITALS — BP 112/67 | HR 63 | Temp 97.1°F | Resp 18 | Wt 236.1 lb

## 2022-12-03 VITALS — BP 108/70 | HR 60

## 2022-12-03 DIAGNOSIS — Z23 Encounter for immunization: Secondary | ICD-10-CM | POA: Diagnosis not present

## 2022-12-03 DIAGNOSIS — N1832 Chronic kidney disease, stage 3b: Secondary | ICD-10-CM

## 2022-12-03 DIAGNOSIS — N183 Chronic kidney disease, stage 3 unspecified: Secondary | ICD-10-CM

## 2022-12-03 DIAGNOSIS — D649 Anemia, unspecified: Secondary | ICD-10-CM

## 2022-12-03 DIAGNOSIS — D631 Anemia in chronic kidney disease: Secondary | ICD-10-CM

## 2022-12-03 MED ORDER — SODIUM CHLORIDE 0.9 % IV SOLN
200.0000 mg | Freq: Once | INTRAVENOUS | Status: AC
  Administered 2022-12-03: 200 mg via INTRAVENOUS
  Filled 2022-12-03: qty 200

## 2022-12-03 MED ORDER — INFLUENZA VAC A&B SURF ANT ADJ 0.5 ML IM SUSY
0.5000 mL | PREFILLED_SYRINGE | Freq: Once | INTRAMUSCULAR | Status: AC
  Administered 2022-12-03: 0.5 mL via INTRAMUSCULAR
  Filled 2022-12-03: qty 0.5

## 2022-12-03 MED ORDER — SODIUM CHLORIDE 0.9 % IV SOLN
INTRAVENOUS | Status: DC
  Filled 2022-12-03: qty 250

## 2022-12-03 MED ORDER — SODIUM CHLORIDE 0.9% FLUSH
10.0000 mL | Freq: Once | INTRAVENOUS | Status: AC | PRN
  Administered 2022-12-03: 10 mL
  Filled 2022-12-03: qty 10

## 2022-12-03 NOTE — Progress Notes (Signed)
Hematology/Oncology Progress note [phone] [phone]       Clinic Day:  12/03/2022  ASSESSMENT & PLAN:   Anemia due to stage 3b chronic kidney disease (HCC) Labs reviewed and discussed with patient.  Hemoglobin remains low despite Venofer and EPO.   Lab Results  Component Value Date   HGB 10.0 (L) 12/01/2022   TIBC 351 12/01/2022   IRONPCTSAT 24 12/01/2022   FERRITIN 30 12/01/2022   Proceed with Retacrit today Ferritin <200, recommend Venofer treatments to further increase iron level.  . Recommend IV Venofer weekly x2 After that we will monitor H&H every 4 weeks, proceed with Retacrit if hemoglobin less than 10.   CKD (chronic kidney disease), stage III (HCC) Encourage oral hydration and avoid nephrotoxins.     Orders Placed This Encounter  Procedures   CBC with Differential (Cancer Center Only)    Standing Status:   Future    Standing Expiration Date:   12/03/2023   Ferritin    Standing Status:   Future    Standing Expiration Date:   12/03/2023   Iron and TIBC    Standing Status:   Future    Standing Expiration Date:   12/03/2023   Retic Panel    Standing Status:   Future    Standing Expiration Date:   12/03/2023   Hemoglobin and Hematocrit (Cancer Center Only)    Standing Status:   Future    Standing Expiration Date:   12/03/2023   Hemoglobin and Hematocrit (Cancer Center Only)    Standing Status:   Future    Standing Expiration Date:   12/03/2023   Hemoglobin and Hematocrit (Cancer Center Only)    Standing Status:   Future    Standing Expiration Date:   12/03/2023  Follow-up Per LOS All questions were answered. The patient knows to call the clinic with any problems, questions or concerns.  Rickard Patience, MD, PhD [facility] Hematology Oncology 12/03/2022    Chief Complaint: Michele Moore is a 72 y.o. female presents for anemia in stage IIIb chronic kidney disease   PERTINENT ONCOLOGY HISTORY Michele Moore is a 72 y.o.afemale who has above  oncology history reviewed by me today presented for follow up visit for management of anemia in CKD  PERTINENT HEMATOLOGY HISTORY Patient previously followed up by Dr.Corcoran, patient switched care to me on 08/30/20 Extensive medical record review was performed by me  # anemia in stage IIIb chronic kidney disease.  Work-up on 04/23/2020 revealed a hematocrit of 35.0, hemoglobin 11.4, MCV 92.3, platelets 168,000, WBC 4,600 with an ANC of 3000. Ferritin was 107 with an iron saturation of 37% and a TIBC of 315. Sed rate was 54 and CRP was 0.9.  Vitamin B12 was 429 and folate >100.0.   10/22/2017 Colonoscopy for heme positive stool revealed one 7 mm polyp in the ascending colon, one 3 mm polyp in the transverse colon, and three 1 to 4 mm polyps in the sigmoid colon. There were non-bleeding internal hemorrhoids. There were petechia(e) in the rectum. Pathology showed two tubular adenomas and one hyperplastic polyp. EGD on 10/22/2017 showed gastritis and one gastric polyp.  She has Sjogren's syndrome, rheumatoid arthritis, hypertension, and proteinuria.  She takes oral iron once a day.  Patient follows up with pain clinic for pain management.  She previously tolerated IV Venofer treatments. 08/14/2022 patient underwent EGD and colonoscopy. Findings included 3 small polyps in the colon, nonbleeding hemorrhoids.  Gastric antral vascular ectasia with bleeding, treated with  APC.  Small hiatal hernia.  INTERVAL HISTORY Michele Moore is a 72 y.o. female who has above history reviewed by me today presents for follow up visit for anemia secondary to chronic kidney disease. Fatigue has improved.     Past Medical History:  Diagnosis Date   (HFpEF) heart failure with preserved ejection fraction (HCC) 01/13/2014   a.) TTE 01/13/2014: EF 45%, apical HK, mod LVH, mild MAC, mild LAE/RVE, mod MR/TR/PR; b.) TTE 10/01/2018: EF 55-60%, mild MVH, mild LAE, triv MR/TR, G1DD   Allergy    ANA positive    Anemia of  chronic renal failure, stage 3 (moderate), unspecified whether stage 3a or 3b CKD (HCC)    Aortic atherosclerosis (HCC)    Asthma    Cataract    Chronic pain syndrome    a.) followed by [facility] Cherylann Ratel, MD)   Chronic, continuous use of opioids    CKD (chronic kidney disease), stage III (HCC)    Complication of anesthesia    a.) anesthesia awareness/premature emergence during colonoscopy   DDD (degenerative disc disease), lumbar    Dyspnea on exertion    Dysrhythmia    IRREGULAR HEART BEAT   Family history of adverse reaction to anesthesia    sister difficult to put to sleep   GERD (gastroesophageal reflux disease)    Glaucoma    Gout    H/O tooth extraction    all lower teeth 1/19   Hemorrhoid    History of hiatal hernia    History of kidney stones    Hypertension    Hypothyroidism    Implantable loop recorder present 08/22/2021   a.) s/p implantation of [employer] LINQ Reveal ILR; serial #: [id number]   Long term current use of immunosuppressive drug    a.) MTX + apremilast   Migraines    Mixed hyperlipidemia    Multiple gastric ulcers    Osteoarthritis    Psoriasis    Psoriatic arthritis (HCC)    a.) on apremilast   Rheumatoid arthritis (HCC)    a.) on MTX   SS-A antibody positive    Wears dentures    partial upper and lower    Past Surgical History:  Procedure Laterality Date   ANKLE SURGERY Right    CARPAL TUNNEL RELEASE     x3   COLONOSCOPY  2000?   COLONOSCOPY WITH PROPOFOL N/A 10/22/2017   Procedure: COLONOSCOPY WITH PROPOFOL;  Surgeon: Midge Minium, MD;  Location: [facility];  Service: Endoscopy;  Laterality: N/A;   COLONOSCOPY WITH PROPOFOL N/A 08/14/2022   Procedure: COLONOSCOPY WITH PROPOFOL;  Surgeon: Midge Minium, MD;  Location: [facility] ENDOSCOPY;  Service: Endoscopy;  Laterality: N/A;   ESOPHAGOGASTRODUODENOSCOPY (EGD) WITH PROPOFOL N/A 10/22/2017   Procedure: ESOPHAGOGASTRODUODENOSCOPY (EGD) WITH PROPOFOL;  Surgeon: Midge Minium,  MD;  Location: [facility] SUR[facility]ERY [facility]TR;  Service: Endoscopy;  Laterality: N/A;   ESOPHAGOGASTRODUODENOSCOPY (EGD) WITH PROPOFOL N/A 08/14/2022   Procedure: ESOPHAGOGASTRODUODENOSCOPY (EGD) WITH PROPOFOL;  Surgeon: Midge Minium, MD;  Location: [facility] ENDOSCOPY;  Service: Endoscopy;  Laterality: N/A;   FUSION OF TALONAVICULAR JOINT Right 08/03/2015   Procedure: TAILOR NAVICULAR JOINT FUSION - RIGHT ;  Surgeon: Gwyneth Revels, DPM;  Location: [facility] ORS;  Service: Podiatry;  Laterality: Right;   HOT HEMOSTASIS  08/14/2022   Procedure: HOT HEMOSTASIS (ARGON PLASMA COAGULATION/BICAP);  Surgeon: Midge Minium, MD;  Location: [facility] ENDOSCOPY;  Service: Endoscopy;;   JOINT REPLACEMENT Right    knee x 3 ,right x1 and left x2   LOOP  RECORDER IMPLANT  08/22/2021   Procedure: LOOP RECORDER IMPLANT; Location: [facility]; Surgeon: Sherryl Manges, MD   POLYPECTOMY N/A 10/22/2017   Procedure: POLYPECTOMY;  Surgeon: Midge Minium, MD;  Location: [facility];  Service: Endoscopy;  Laterality: N/A;   POLYPECTOMY  08/14/2022   Procedure: POLYPECTOMY;  Surgeon: Midge Minium, MD;  Location: [facility] ENDOSCOPY;  Service: Endoscopy;;   ROTATOR CUFF REPAIR Right 1995   TONSILLECTOMY     TOTAL KNEE REVISION Right 01/20/2018   Procedure: POLYETHYLENE EXCHANGE;  Surgeon: Donato Heinz, MD;  Location: [facility] ORS;  Service: Orthopedics;  Laterality: Right;   TOTAL KNEE REVISION Left 10/09/2021   Procedure: LEFT TOTAL KNEE REVISION WITH POLYETHYLENE EXCHANGE.;  Surgeon: Donato Heinz, MD;  Location: [facility] ORS;  Service: Orthopedics;  Laterality: Left;   TUBAL LIGATION     UPPER GI ENDOSCOPY  2000?    Family History  Problem Relation Age of Onset   Heart failure Mother    Gout Mother    Arthritis Mother    Hypertension Mother    Stroke Mother    Heart failure Father    Diabetes Father    Hyperlipidemia Father    COPD Sister    Cancer Maternal Aunt        breast   Cancer Maternal Uncle        kidney   Breast cancer Neg Hx      Social History:  reports that she quit smoking about 29 years ago. Her smoking use included cigarettes. She started smoking about 49 years ago. She has a 40 pack-year smoking history. She has never used smokeless tobacco. She reports that she does not drink alcohol and does not use drugs.  Allergies:  Allergies  Allergen Reactions   Ampicillin Swelling   Penicillins Anaphylaxis and Swelling    IgE = 10 (11/05/2017)  Has patient had a PCN reaction causing immediate rash, facial/tongue/throat swelling, SOB or lightheadedness with hypotension: Yes Has patient had a PCN reaction causing severe rash involving mucus membranes or skin necrosis: No Has patient had a PCN reaction that required hospitalization: No Has patient had a PCN reaction occurring within the last 10 years: Yes If all of the above answers are "NO", then may proceed with Cephalosporin use.   Vancomycin Rash    Other reaction(s): Other (see comments)   Vibramycin [Doxycycline Calcium] Rash    Current Medications: Current Outpatient Medications  Medication Sig Dispense Refill   acetaminophen (TYLENOL) 650 MG CR tablet Take 650 mg by mouth every 8 (eight) hours as needed for pain.     albuterol (VENTOLIN HFA) 108 (90 Base) MCG/ACT inhaler Inhale 2 puffs into the lungs every 6 (six) hours as needed for wheezing or shortness of breath. 8 g 2   allopurinol (ZYLOPRIM) 100 MG tablet Take 200 mg by mouth every morning.      betamethasone dipropionate 0.05 % cream APPLY TO PSORIASIS ON HANDS TWICE DAILY ONLY. AVOID FACE, GROIN AND AXILLA 45 g 0   calcipotriene (DOVONOX) 0.005 % cream APPLY TOPICALLY TO PSORIASIS AREAS ON LEGS AND FEET ONCE OR TWICE DAILY 540 g 1   cetirizine (ZYRTEC) 10 MG tablet Take 10 mg by mouth daily.     Cholecalciferol (VITAMIN D3) 2000 units TABS Take 2,000 Units by mouth daily.     clobetasol ointment (TEMOVATE) 0.05 % Apply topically 2 (two) times daily.     diclofenac sodium (VOLTAREN) 1 % GEL Apply 2  g topically 3 (three) times daily.  FARXIGA 10 MG TABS tablet Take 10 mg by mouth every morning.     furosemide (LASIX) 40 MG tablet Take 40 mg by mouth daily.     gabapentin (NEURONTIN) 600 MG tablet Take 1 tablet (600 mg total) by mouth at bedtime. 30 tablet 5   HYDROcodone-acetaminophen (NORCO) 7.5-325 MG tablet Take 1 tablet by mouth every 8 (eight) hours as needed for severe pain. Must last 30 days. 90 tablet 0   [START ON 12/20/2022] HYDROcodone-acetaminophen (NORCO) 7.5-325 MG tablet Take 1 tablet by mouth every 8 (eight) hours as needed for severe pain. Must last 30 days. 90 tablet 0   JARDIANCE 10 MG TABS tablet Take 10 mg by mouth every morning.     ketoconazole (NIZORAL) 2 % cream Apply topically daily as needed. 15 g 2   levothyroxine (SYNTHROID) 137 MCG tablet TAKE 1 TABLET(137 MCG) BY MOUTH DAILY 90 tablet 0   losartan (COZAAR) 25 MG tablet Take 25 mg by mouth daily.     meclizine (ANTIVERT) 12.5 MG tablet TAKE 1 TABLET(12.5 MG) BY MOUTH THREE TIMES DAILY AS NEEDED FOR DIZZINESS 30 tablet 1   metoprolol tartrate (LOPRESSOR) 25 MG tablet Take 1 tablet (25 mg total) by mouth 2 (two) times daily. 60 tablet 0   montelukast (SINGULAIR) 10 MG tablet TAKE 1 TABLET(10 MG) BY MOUTH AT BEDTIME 90 tablet 1   Multiple Vitamins-Calcium (ONE-A-DAY WOMENS FORMULA PO) Take 1 tablet by mouth daily.     mupirocin ointment (BACTROBAN) 2 % Apply 1 application topically 2 (two) times daily. 22 g 0   nystatin (MYCOSTATIN) 100000 UNIT/ML suspension Take 5 mLs (500,000 Units total) by mouth 4 (four) times daily. 60 mL 0   nystatin cream (MYCOSTATIN) Apply 1 Application topically 2 (two) times daily. 30 g 0   omega-3 acid ethyl esters (LOVAZA) 1 g capsule Take 1 capsule (1 g total) by mouth 2 (two) times daily. 180 capsule 1   omeprazole (PRILOSEC) 40 MG capsule TAKE 1 CAPSULE(40 MG) BY MOUTH DAILY 90 capsule 1   OTEZLA 30 MG TABS Take 1 tablet by mouth daily.     Polyethyl Glycol-Propyl Glycol (SYSTANE  OP) Place 1 drop into both eyes daily as needed (dry eyes).     potassium chloride SA (KLOR-CON M) 20 MEQ tablet TAKE 1 TABLET(20 MEQ) BY MOUTH DAILY 90 tablet 0   tiZANidine (ZANAFLEX) 4 MG tablet Take 1 tablet (4 mg total) by mouth at bedtime. 60 tablet 5   triamcinolone (KENALOG) 0.1 % Apply 1 application topically 2 (two) times daily. 30 g 0   predniSONE (STERAPRED UNI-PAK 21 TAB) 10 MG (21) TBPK tablet Day 1 take 6 tablets, Day 2 take 5 tablets, Day 3 take 4 tablets, Day 4 take 3 tablets, Day 5 take 2 tablets D6 take 1 tablet (Patient not taking: Reported on 10/14/2022) 1 each 0   No current facility-administered medications for this visit.   Facility-Administered Medications Ordered in Other Visits  Medication Dose Route Frequency Provider Last Rate Last Admin   0.9 %  sodium chloride infusion   Intravenous Continuous Rickard Patience, MD   Stopped at 12/03/22 1558    Review of Systems  Constitutional:  Negative for chills, diaphoresis, fever, malaise/fatigue and weight loss.  HENT:  Negative for congestion, ear discharge, ear pain, hearing loss, nosebleeds, sinus pain, sore throat and tinnitus.   Eyes:        Glaucoma  Respiratory:  Negative for cough, hemoptysis, sputum production and shortness of breath (  on exertion, due to asthma).   Cardiovascular:  Positive for leg swelling (right ankle). Negative for chest pain and palpitations.  Gastrointestinal:  Negative for abdominal pain, blood in stool, constipation, diarrhea, heartburn, melena, nausea and vomiting.  Genitourinary:  Negative for dysuria, frequency, hematuria and urgency.  Musculoskeletal:  Positive for joint pain (arthritis). Negative for back pain, myalgias and neck pain.       Status post knee replacement  Skin:  Positive for itching and rash.  Neurological:  Positive for sensory change (foot numbness s/p surgery). Negative for dizziness, tingling, weakness and headaches.  Endo/Heme/Allergies:  Does not bruise/bleed easily.   Psychiatric/Behavioral:  Negative for depression and memory loss. The patient is not nervous/anxious and does not have insomnia.   All other systems reviewed and are negative.   Performance status (ECOG): 1-2  Vitals Blood pressure 112/67, pulse 63, temperature (!) 97.1 F (36.2 C), temperature source Tympanic, resp. rate 18, weight 236 lb 1.6 oz (107.1 kg), SpO2 100%.   Physical Exam Vitals and nursing note reviewed.  Constitutional:      General: She is not in acute distress.    Appearance: She is not diaphoretic.  Eyes:     General: No scleral icterus. Abdominal:     Palpations: Abdomen is soft.  Skin:    General: Skin is warm.  Neurological:     Mental Status: She is alert and oriented to person, place, and time.  Psychiatric:        Behavior: Behavior normal.    Labs    Latest Ref Rng & Units 12/01/2022    3:36 PM 10/30/2022    3:22 PM 10/26/2022    2:39 AM  CBC  WBC 4.0 - 10.5 K/uL 6.1   7.7   Hemoglobin 12.0 - 15.0 g/dL 69.6  29.5  28.4   Hematocrit 36.0 - 46.0 % 31.9  32.1  33.4   Platelets 150 - 400 K/uL 214   178       Latest Ref Rng & Units 10/26/2022    2:39 AM 09/25/2022    4:19 PM 07/01/2022    3:42 PM  CMP  Glucose 70 - 99 mg/dL 132  84  94   BUN 8 - 23 mg/dL 30  21  25    Creatinine 0.44 - 1.00 mg/dL 4.40  1.02  7.25   Sodium 135 - 145 mmol/L 140  144  142   Potassium 3.5 - 5.1 mmol/L 3.5  4.1  3.8   Chloride 98 - 111 mmol/L 105  104  104   CO2 22 - 32 mmol/L 26  25  23    Calcium 8.9 - 10.3 mg/dL 36.6  44.0  34.7   Total Protein 6.0 - 8.5 g/dL   6.2   Total Bilirubin 0.0 - 1.2 mg/dL   <4.2   Alkaline Phos 44 - 121 IU/L   115   AST 0 - 40 IU/L   21   ALT 0 - 32 IU/L   13

## 2022-12-03 NOTE — Patient Instructions (Signed)
Iron Sucrose Injection What is this medication? IRON SUCROSE (EYE ern SOO krose) treats low levels of iron (iron deficiency anemia) in people with kidney disease. Iron is a mineral that plays an important role in making red blood cells, which carry oxygen from your lungs to the rest of your body. This medicine may be used for other purposes; ask your health care provider or pharmacist if you have questions. COMMON BRAND NAME(S): Venofer What should I tell my care team before I take this medication? They need to know if you have any of these conditions: Anemia not caused by low iron levels Heart disease High levels of iron in the blood Kidney disease Liver disease An unusual or allergic reaction to iron, other medications, foods, dyes, or preservatives Pregnant or trying to get pregnant Breastfeeding How should I use this medication? This medication is for infusion into a vein. It is given in a hospital or clinic setting. Talk to your care team about the use of this medication in children. While this medication may be prescribed for children as young as 2 years for selected conditions, precautions do apply. Overdosage: If you think you have taken too much of this medicine contact a poison control center or emergency room at once. NOTE: This medicine is only for you. Do not share this medicine with others. What if I miss a dose? Keep appointments for follow-up doses. It is important not to miss your dose. Call your care team if you are unable to keep an appointment. What may interact with this medication? Do not take this medication with any of the following: Deferoxamine Dimercaprol Other iron products This medication may also interact with the following: Chloramphenicol Deferasirox This list may not describe all possible interactions. Give your health care provider a list of all the medicines, herbs, non-prescription drugs, or dietary supplements you use. Also tell them if you smoke,  drink alcohol, or use illegal drugs. Some items may interact with your medicine. What should I watch for while using this medication? Visit your care team regularly. Tell your care team if your symptoms do not start to get better or if they get worse. You may need blood work done while you are taking this medication. You may need to follow a special diet. Talk to your care team. Foods that contain iron include: whole grains/cereals, dried fruits, beans, or peas, leafy green vegetables, and organ meats (liver, kidney). What side effects may I notice from receiving this medication? Side effects that you should report to your care team as soon as possible: Allergic reactions--skin rash, itching, hives, swelling of the face, lips, tongue, or throat Low blood pressure--dizziness, feeling faint or lightheaded, blurry vision Shortness of breath Side effects that usually do not require medical attention (report to your care team if they continue or are bothersome): Flushing Headache Joint pain Muscle pain Nausea Pain, redness, or irritation at injection site This list may not describe all possible side effects. Call your doctor for medical advice about side effects. You may report side effects to FDA at 1-800-FDA-1088. Where should I keep my medication? This medication is given in a hospital or clinic. It will not be stored at home. NOTE: This sheet is a summary. It may not cover all possible information. If you have questions about this medicine, talk to your doctor, pharmacist, or health care provider.  2024 Elsevier/Gold Standard (2022-07-18 00:00:00)

## 2022-12-03 NOTE — Progress Notes (Signed)
Patient tolerated Venofer infusion well. Explained recommendation of 30 min post monitoring. Patient refused to wait post monitoring. Educated on what signs to watch for & to call with any concerns. Patient aware of appointment tomorrow. No questions, discharged. Stable

## 2022-12-03 NOTE — Assessment & Plan Note (Signed)
Encourage oral hydration and avoid nephrotoxins.   

## 2022-12-03 NOTE — Assessment & Plan Note (Addendum)
Labs reviewed and discussed with patient.  Hemoglobin remains low despite Venofer and EPO.   Lab Results  Component Value Date   HGB 10.0 (L) 12/01/2022   TIBC 351 12/01/2022   IRONPCTSAT 24 12/01/2022   FERRITIN 30 12/01/2022   Proceed with Retacrit today Ferritin <200, recommend Venofer treatments to further increase iron level.  . Recommend IV Venofer weekly x2 After that we will monitor H&H every 4 weeks, proceed with Retacrit if hemoglobin less than 10.

## 2022-12-04 ENCOUNTER — Inpatient Hospital Stay: Payer: Medicare Other

## 2022-12-04 VITALS — BP 130/98 | HR 56

## 2022-12-04 DIAGNOSIS — D631 Anemia in chronic kidney disease: Secondary | ICD-10-CM | POA: Diagnosis not present

## 2022-12-04 DIAGNOSIS — N183 Chronic kidney disease, stage 3 unspecified: Secondary | ICD-10-CM

## 2022-12-04 DIAGNOSIS — D649 Anemia, unspecified: Secondary | ICD-10-CM

## 2022-12-04 DIAGNOSIS — Z23 Encounter for immunization: Secondary | ICD-10-CM | POA: Diagnosis not present

## 2022-12-04 DIAGNOSIS — N1832 Chronic kidney disease, stage 3b: Secondary | ICD-10-CM | POA: Diagnosis not present

## 2022-12-04 MED ORDER — EPOETIN ALFA-EPBX 20000 UNIT/ML IJ SOLN
20000.0000 [IU] | Freq: Once | INTRAMUSCULAR | Status: AC
  Administered 2022-12-04: 20000 [IU] via SUBCUTANEOUS
  Filled 2022-12-04: qty 1

## 2022-12-08 DIAGNOSIS — D631 Anemia in chronic kidney disease: Secondary | ICD-10-CM | POA: Diagnosis not present

## 2022-12-08 DIAGNOSIS — N184 Chronic kidney disease, stage 4 (severe): Secondary | ICD-10-CM | POA: Diagnosis not present

## 2022-12-08 DIAGNOSIS — I10 Essential (primary) hypertension: Secondary | ICD-10-CM | POA: Diagnosis not present

## 2022-12-08 DIAGNOSIS — N2581 Secondary hyperparathyroidism of renal origin: Secondary | ICD-10-CM | POA: Diagnosis not present

## 2022-12-10 ENCOUNTER — Inpatient Hospital Stay: Payer: Medicare Other

## 2022-12-12 ENCOUNTER — Inpatient Hospital Stay: Payer: Medicare Other

## 2022-12-12 VITALS — BP 107/55 | HR 45 | Temp 97.4°F | Resp 16

## 2022-12-12 DIAGNOSIS — D631 Anemia in chronic kidney disease: Secondary | ICD-10-CM | POA: Diagnosis not present

## 2022-12-12 DIAGNOSIS — D649 Anemia, unspecified: Secondary | ICD-10-CM

## 2022-12-12 DIAGNOSIS — N1832 Chronic kidney disease, stage 3b: Secondary | ICD-10-CM | POA: Diagnosis not present

## 2022-12-12 DIAGNOSIS — N183 Chronic kidney disease, stage 3 unspecified: Secondary | ICD-10-CM

## 2022-12-12 DIAGNOSIS — Z23 Encounter for immunization: Secondary | ICD-10-CM | POA: Diagnosis not present

## 2022-12-12 MED ORDER — SODIUM CHLORIDE 0.9 % IV SOLN
INTRAVENOUS | Status: DC
  Filled 2022-12-12: qty 250

## 2022-12-12 MED ORDER — SODIUM CHLORIDE 0.9 % IV SOLN
200.0000 mg | Freq: Once | INTRAVENOUS | Status: AC
  Administered 2022-12-12: 200 mg via INTRAVENOUS
  Filled 2022-12-12: qty 200

## 2022-12-12 NOTE — Patient Instructions (Signed)
Iron Sucrose Injection What is this medication? IRON SUCROSE (EYE ern SOO krose) treats low levels of iron (iron deficiency anemia) in people with kidney disease. Iron is a mineral that plays an important role in making red blood cells, which carry oxygen from your lungs to the rest of your body. This medicine may be used for other purposes; ask your health care provider or pharmacist if you have questions. COMMON BRAND NAME(S): Venofer What should I tell my care team before I take this medication? They need to know if you have any of these conditions: Anemia not caused by low iron levels Heart disease High levels of iron in the blood Kidney disease Liver disease An unusual or allergic reaction to iron, other medications, foods, dyes, or preservatives Pregnant or trying to get pregnant Breastfeeding How should I use this medication? This medication is for infusion into a vein. It is given in a hospital or clinic setting. Talk to your care team about the use of this medication in children. While this medication may be prescribed for children as young as 2 years for selected conditions, precautions do apply. Overdosage: If you think you have taken too much of this medicine contact a poison control center or emergency room at once. NOTE: This medicine is only for you. Do not share this medicine with others. What if I miss a dose? Keep appointments for follow-up doses. It is important not to miss your dose. Call your care team if you are unable to keep an appointment. What may interact with this medication? Do not take this medication with any of the following: Deferoxamine Dimercaprol Other iron products This medication may also interact with the following: Chloramphenicol Deferasirox This list may not describe all possible interactions. Give your health care provider a list of all the medicines, herbs, non-prescription drugs, or dietary supplements you use. Also tell them if you smoke,  drink alcohol, or use illegal drugs. Some items may interact with your medicine. What should I watch for while using this medication? Visit your care team regularly. Tell your care team if your symptoms do not start to get better or if they get worse. You may need blood work done while you are taking this medication. You may need to follow a special diet. Talk to your care team. Foods that contain iron include: whole grains/cereals, dried fruits, beans, or peas, leafy green vegetables, and organ meats (liver, kidney). What side effects may I notice from receiving this medication? Side effects that you should report to your care team as soon as possible: Allergic reactions--skin rash, itching, hives, swelling of the face, lips, tongue, or throat Low blood pressure--dizziness, feeling faint or lightheaded, blurry vision Shortness of breath Side effects that usually do not require medical attention (report to your care team if they continue or are bothersome): Flushing Headache Joint pain Muscle pain Nausea Pain, redness, or irritation at injection site This list may not describe all possible side effects. Call your doctor for medical advice about side effects. You may report side effects to FDA at 1-800-FDA-1088. Where should I keep my medication? This medication is given in a hospital or clinic. It will not be stored at home. NOTE: This sheet is a summary. It may not cover all possible information. If you have questions about this medicine, talk to your doctor, pharmacist, or health care provider.  2024 Elsevier/Gold Standard (2022-07-18 00:00:00)

## 2022-12-22 ENCOUNTER — Ambulatory Visit (INDEPENDENT_AMBULATORY_CARE_PROVIDER_SITE_OTHER): Payer: Medicare Other

## 2022-12-22 DIAGNOSIS — R002 Palpitations: Secondary | ICD-10-CM

## 2022-12-22 LAB — CUP PACEART REMOTE DEVICE CHECK
Date Time Interrogation Session: 20241027231348
Implantable Pulse Generator Implant Date: 20230629

## 2022-12-25 DIAGNOSIS — Z79899 Other long term (current) drug therapy: Secondary | ICD-10-CM | POA: Diagnosis not present

## 2022-12-25 DIAGNOSIS — L2089 Other atopic dermatitis: Secondary | ICD-10-CM | POA: Diagnosis not present

## 2022-12-25 DIAGNOSIS — Z111 Encounter for screening for respiratory tuberculosis: Secondary | ICD-10-CM | POA: Diagnosis not present

## 2022-12-27 ENCOUNTER — Other Ambulatory Visit: Payer: Self-pay | Admitting: Internal Medicine

## 2022-12-31 ENCOUNTER — Inpatient Hospital Stay: Payer: Medicare Other | Attending: Oncology

## 2022-12-31 ENCOUNTER — Inpatient Hospital Stay: Payer: Medicare Other

## 2022-12-31 ENCOUNTER — Other Ambulatory Visit: Payer: Self-pay

## 2022-12-31 DIAGNOSIS — N1832 Chronic kidney disease, stage 3b: Secondary | ICD-10-CM | POA: Diagnosis not present

## 2022-12-31 DIAGNOSIS — D631 Anemia in chronic kidney disease: Secondary | ICD-10-CM | POA: Diagnosis not present

## 2022-12-31 DIAGNOSIS — D649 Anemia, unspecified: Secondary | ICD-10-CM

## 2022-12-31 LAB — SAMPLE TO BLOOD BANK

## 2022-12-31 LAB — HEMOGLOBIN AND HEMATOCRIT (CANCER CENTER ONLY)
HCT: 32.9 % — ABNORMAL LOW (ref 36.0–46.0)
Hemoglobin: 10.5 g/dL — ABNORMAL LOW (ref 12.0–15.0)

## 2023-01-05 DIAGNOSIS — Z79899 Other long term (current) drug therapy: Secondary | ICD-10-CM | POA: Diagnosis not present

## 2023-01-05 DIAGNOSIS — M1A00X0 Idiopathic chronic gout, unspecified site, without tophus (tophi): Secondary | ICD-10-CM | POA: Diagnosis not present

## 2023-01-05 DIAGNOSIS — L409 Psoriasis, unspecified: Secondary | ICD-10-CM | POA: Diagnosis not present

## 2023-01-05 DIAGNOSIS — L4050 Arthropathic psoriasis, unspecified: Secondary | ICD-10-CM | POA: Diagnosis not present

## 2023-01-05 DIAGNOSIS — M150 Primary generalized (osteo)arthritis: Secondary | ICD-10-CM | POA: Diagnosis not present

## 2023-01-08 ENCOUNTER — Ambulatory Visit
Payer: Medicare Other | Attending: Student in an Organized Health Care Education/Training Program | Admitting: Student in an Organized Health Care Education/Training Program

## 2023-01-08 ENCOUNTER — Encounter: Payer: Self-pay | Admitting: Student in an Organized Health Care Education/Training Program

## 2023-01-08 VITALS — BP 144/87 | HR 102 | Temp 97.1°F | Resp 17 | Ht 71.0 in | Wt 236.0 lb

## 2023-01-08 DIAGNOSIS — M47812 Spondylosis without myelopathy or radiculopathy, cervical region: Secondary | ICD-10-CM | POA: Diagnosis not present

## 2023-01-08 DIAGNOSIS — M25562 Pain in left knee: Secondary | ICD-10-CM | POA: Insufficient documentation

## 2023-01-08 DIAGNOSIS — M47816 Spondylosis without myelopathy or radiculopathy, lumbar region: Secondary | ICD-10-CM | POA: Diagnosis not present

## 2023-01-08 DIAGNOSIS — G5702 Lesion of sciatic nerve, left lower limb: Secondary | ICD-10-CM | POA: Diagnosis not present

## 2023-01-08 DIAGNOSIS — M19011 Primary osteoarthritis, right shoulder: Secondary | ICD-10-CM | POA: Insufficient documentation

## 2023-01-08 DIAGNOSIS — M51369 Other intervertebral disc degeneration, lumbar region without mention of lumbar back pain or lower extremity pain: Secondary | ICD-10-CM | POA: Insufficient documentation

## 2023-01-08 DIAGNOSIS — M5416 Radiculopathy, lumbar region: Secondary | ICD-10-CM | POA: Insufficient documentation

## 2023-01-08 DIAGNOSIS — G588 Other specified mononeuropathies: Secondary | ICD-10-CM | POA: Insufficient documentation

## 2023-01-08 DIAGNOSIS — Z96652 Presence of left artificial knee joint: Secondary | ICD-10-CM | POA: Insufficient documentation

## 2023-01-08 DIAGNOSIS — G894 Chronic pain syndrome: Secondary | ICD-10-CM | POA: Insufficient documentation

## 2023-01-08 DIAGNOSIS — M1612 Unilateral primary osteoarthritis, left hip: Secondary | ICD-10-CM | POA: Insufficient documentation

## 2023-01-08 DIAGNOSIS — G8929 Other chronic pain: Secondary | ICD-10-CM | POA: Insufficient documentation

## 2023-01-08 MED ORDER — HYDROCODONE-ACETAMINOPHEN 7.5-325 MG PO TABS: 1.0000 | ORAL_TABLET | Freq: Three times a day (TID) | ORAL | 0 refills | Status: AC | PRN

## 2023-01-08 MED ORDER — TIZANIDINE HCL 4 MG PO TABS: 4.0000 mg | ORAL_TABLET | Freq: Every day | ORAL | 5 refills | Status: DC

## 2023-01-08 MED ORDER — HYDROCODONE-ACETAMINOPHEN 7.5-325 MG PO TABS: 1.0000 | ORAL_TABLET | Freq: Three times a day (TID) | ORAL | 0 refills | Status: DC | PRN

## 2023-01-08 MED ORDER — GABAPENTIN 600 MG PO TABS: 600.0000 mg | ORAL_TABLET | Freq: Every day | ORAL | 5 refills | Status: DC

## 2023-01-08 NOTE — Progress Notes (Signed)
Nursing Pain Medication Assessment:  Safety precautions to be maintained throughout the outpatient stay will include: orient to surroundings, keep bed in low position, maintain call bell within reach at all times, provide assistance with transfer out of bed and ambulation.  Medication Inspection Compliance: Pill count conducted under aseptic conditions, in front of the patient. Neither the pills nor the bottle was removed from the patient's sight at any time. Once count was completed pills were immediately returned to the patient in their original bottle.  Medication: Hydrocodone/APAP Pill/Patch Count:  35 of 90 pills remain Pill/Patch Appearance: Markings consistent with prescribed medication Bottle Appearance: Standard pharmacy container. Clearly labeled. Filled Date: 52 / 27 / 2024 Last Medication intake:  Today

## 2023-01-08 NOTE — Progress Notes (Signed)
PROVIDER NOTE: Information contained herein reflects review and annotations entered in association with encounter. Interpretation of such information and data should be left to medically-trained personnel. Information provided to patient can be located elsewhere in the medical record under "Patient Instructions". Document created using STT-dictation technology, any transcriptional errors that may result from process are unintentional.    Patient: Michele Moore  Service Category: E/M  Provider: Edward Jolly, MD  DOB: 1950/11/28  DOS: 01/08/2023  Referring Provider: Duanne Limerick, MD  MRN: [id number]  Specialty: Interventional Pain Management  PCP: Duanne Limerick, MD  Type: Established Patient  Setting: Ambulatory outpatient    Location: Office  Delivery: Face-to-face     HPI  [name], a 72 y.o. year old female, is here today because of her Chronic knee pain after total replacement of left knee joint [M25.562, G89.29, Z96.652]. Ms. Plain's primary complain today is Back Pain, Shoulder Pain (right), Hip Pain (bilat), Hand Pain (bilat), and Knee Pain (left)  Pertinent problems: [name] has Chronic knee pain after total replacement of left knee joint; Lumbar radiculopathy; Lumbar degenerative disc disease; Chronic pain syndrome; Spinal stenosis, lumbar region, with neurogenic claudication; Cervical facet syndrome; Primary osteoarthritis of right shoulder; Sprain of anterior talofibular ligament of right ankle; Severe obesity (BMI 35.0-35.9 with comorbidity) (HCC); CKD (chronic kidney disease), stage III (HCC); Primary osteoarthritis of left hip; and Lumbar spondylosis on their pertinent problem list. Pain Assessment: Severity of Chronic pain is reported as a 10-Worst pain ever/10. Location: Back Lower/to right hip. Onset: More than a month ago. Quality: Aching. Timing: Constant. Modifying factor(s): meds. Vitals:  height is 5\' 11"  (1.803 m) and weight is 236 lb (107 kg). Her temperature is 97.1 F  (36.2 C) (abnormal). Her blood pressure is 144/87 (abnormal) and her pulse is 102 (abnormal). Her respiration is 17 and oxygen saturation is 99%.  BMI: Estimated body mass index is 32.92 kg/m as calculated from the following:   Height as of this encounter: 5\' 11"  (1.803 m).   Weight as of this encounter: 236 lb (107 kg). Last encounter: 10/14/22 Last procedure: 05/05/2022.  Reason for encounter: medication management.  No change in medical history since last visit.  Patient's pain is at baseline.  Patient continues multimodal pain regimen as prescribed.  States that it provides pain relief and improvement in functional status. Has had a lot of family members that have passed since her last visit with me  Pharmacotherapy Assessment  Analgesic: Norco 7.5 mg TID PRN #90/month, 22.5    Monitoring: [place] PMP: PDMP reviewed during this encounter.       Pharmacotherapy: No side-effects or adverse reactions reported. Compliance: No problems identified. Effectiveness: Clinically acceptable.  Nonah Mattes, RN  01/08/2023  3:08 PM  Sign when Signing Visit Nursing Pain Medication Assessment:  Safety precautions to be maintained throughout the outpatient stay will include: orient to surroundings, keep bed in low position, maintain call bell within reach at all times, provide assistance with transfer out of bed and ambulation.  Medication Inspection Compliance: Pill count conducted under aseptic conditions, in front of the patient. Neither the pills nor the bottle was removed from the patient's sight at any time. Once count was completed pills were immediately returned to the patient in their original bottle.  Medication: Hydrocodone/APAP Pill/Patch Count:  35 of 90 pills remain Pill/Patch Appearance: Markings consistent with prescribed medication Bottle Appearance: Standard pharmacy container. Clearly labeled. Filled Date: 66 / 27 / 2024 Last Medication intake:  Today    No results found for:  "CBDTHCR" No results found for: "D8THCCBX" No results found for: "D9THCCBX"  UDS:  Summary  Date Value Ref Range Status  07/17/2022 Note  Final    Comment:    ==================================================================== [facility] 13 (MW) ==================================================================== Test                             Result       Flag       Units  Drug Present and Declared for Prescription Verification   Hydrocodone                    741          EXPECTED   ng/mg creat   Hydromorphone                  182          EXPECTED   ng/mg creat   Norhydrocodone                 339          EXPECTED   ng/mg creat    Sources of hydrocodone include scheduled prescription medications.    Hydromorphone and norhydrocodone are expected metabolites of    hydrocodone. Hydromorphone is also available as a scheduled    prescription medication.  ==================================================================== Test                      Result    Flag   Units      Ref Range   Creatinine              51               mg/dL      >=69 ==================================================================== Declared Medications:  The flagging and interpretation on this report are based on the  following declared medications.  Unexpected results may arise from  inaccuracies in the declared medications.   **Note: The testing scope of this panel includes these medications:   Hydrocodone (Norco)   **Note: The testing scope of this panel does not include the  following reported medications:   Acetaminophen (Tylenol)  Acetaminophen (Norco)  Albuterol (Ventolin HFA)  Allopurinol (Zyloprim)  Apremilast (Otezla)  Betamethasone  Calcipotriene  Calcium  Clobetasol (Temovate)  Dapagliflozin (Farxiga)  Fluticasone (Flonase)  Furosemide (Lasix)  Gabapentin (Neurontin)  Ketoconazole (Nizoral)  Levothyroxine (Synthroid)  Losartan (Cozaar)  Meclizine (Antivert)   Metoprolol (Lopressor)  Mometasone (Nasonex)  Montelukast (Singulair)  Multivitamin  Mupirocin (Bactroban)  Nystatin (Mycostatin)  Omega-3 Fatty Acids  Omeprazole (Prilosec)  Polyethylene Glycol  Potassium (Klor-Con)  Tizanidine (Zanaflex)  Topical Diclofenac (Voltaren)  Triamcinolone (Kenalog)  Vitamin D3 ==================================================================== For clinical consultation, please call [phone]. ====================================================================       ROS  Constitutional: Denies any fever or chills Gastrointestinal: No reported hemesis, hematochezia, vomiting, or acute GI distress Musculoskeletal:  Low back pain with radiation into left buttock and left hip Neurological:  Numbness overlying left buttock  Medication Review  Apremilast, Dupilumab, HYDROcodone-acetaminophen, Multiple Vitamins-Calcium, Polyethyl Glycol-Propyl Glycol, Upadacitinib, Vitamin D3, acetaminophen, albuterol, allopurinol, betamethasone dipropionate, calcipotriene, cetirizine, clobetasol ointment, dapagliflozin propanediol, diclofenac sodium, empagliflozin, fluticasone, furosemide, gabapentin, ketoconazole, levothyroxine, losartan, meclizine, metoprolol tartrate, mometasone, montelukast, mupirocin ointment, nystatin, nystatin cream, omega-3 acid ethyl esters, omeprazole, potassium chloride SA, predniSONE, tiZANidine, and triamcinolone cream  History Review  Allergy: Ms. Cranmer is allergic to ampicillin, penicillins, vancomycin,  and vibramycin [doxycycline calcium]. Drug: Ms. Darrington  reports no history of drug use. Alcohol:  reports no history of alcohol use. Tobacco:  reports that she quit smoking about 29 years ago. Her smoking use included cigarettes. She started smoking about 49 years ago. She has a 40 pack-year smoking history. She has never used smokeless tobacco. Social: Ms. Sianez  reports that she quit smoking about 29 years ago. Her smoking use included  cigarettes. She started smoking about 49 years ago. She has a 40 pack-year smoking history. She has never used smokeless tobacco. She reports that she does not drink alcohol and does not use drugs. Medical:  has a past medical history of (HFpEF) heart failure with preserved ejection fraction (HCC) (01/13/2014), Allergy, ANA positive, Anemia of chronic renal failure, stage 3 (moderate), unspecified whether stage 3a or 3b CKD (HCC), Aortic atherosclerosis (HCC), Asthma, Cataract, Chronic pain syndrome, Chronic, continuous use of opioids, CKD (chronic kidney disease), stage III (HCC), Complication of anesthesia, DDD (degenerative disc disease), lumbar, Dyspnea on exertion, Dysrhythmia, Family history of adverse reaction to anesthesia, GERD (gastroesophageal reflux disease), Glaucoma, Gout, H/O tooth extraction, Hemorrhoid, History of hiatal hernia, History of kidney stones, Hypertension, Hypothyroidism, Implantable loop recorder present (08/22/2021), Long term current use of immunosuppressive drug, Migraines, Mixed hyperlipidemia, Multiple gastric ulcers, Osteoarthritis, Psoriasis, Psoriatic arthritis (HCC), Rheumatoid arthritis (HCC), SS-A antibody positive, and Wears dentures. Surgical: Ms. Affleck  has a past surgical history that includes Carpal tunnel release; Rotator cuff repair (Right, 1995); Ankle surgery (Right); Tubal ligation; Colonoscopy (2000?); Upper gi endoscopy (2000?); Tonsillectomy; Fusion of talonavicular joint (Right, 08/03/2015); Colonoscopy with propofol (N/A, 10/22/2017); Esophagogastroduodenoscopy (egd) with propofol (N/A, 10/22/2017); polypectomy (N/A, 10/22/2017); Total knee revision (Right, 01/20/2018); Joint replacement (Right); Loop recorder implant (08/22/2021); Total knee revision (Left, 10/09/2021); Colonoscopy with propofol (N/A, 08/14/2022); Esophagogastroduodenoscopy (egd) with propofol (N/A, 08/14/2022); Hot hemostasis (08/14/2022); and polypectomy (08/14/2022). Family: family history  includes Arthritis in her mother; COPD in her sister; Cancer in her maternal aunt and maternal uncle; Diabetes in her father; Gout in her mother; Heart failure in her father and mother; Hyperlipidemia in her father; Hypertension in her mother; Stroke in her mother.  Laboratory Chemistry Profile   Renal Lab Results  Component Value Date   BUN 30 (H) 10/26/2022   CREATININE 1.71 (H) 10/26/2022   BCR 13 09/25/2022   GFRAA 30 (L) 08/28/2019   GFRNONAA 31 (L) 10/26/2022    Hepatic Lab Results  Component Value Date   AST 21 07/01/2022   ALT 13 07/01/2022   ALBUMIN 4.1 09/25/2022   ALKPHOS 115 07/01/2022    Electrolytes Lab Results  Component Value Date   NA 140 10/26/2022   K 3.5 10/26/2022   CL 105 10/26/2022   CALCIUM 10.3 10/26/2022   MG 2.1 10/26/2022   PHOS 3.0 09/25/2022    Bone No results found for: "VD25OH", "VD125OH2TOT", "GN5621HY8", "MV7846NG2", "25OHVITD1", "25OHVITD2", "25OHVITD3", "TESTOFREE", "TESTOSTERONE"  Inflammation (CRP: Acute Phase) (ESR: Chronic Phase) Lab Results  Component Value Date   CRP 1.6 (H) 09/27/2021   ESRSEDRATE 79 (H) 09/27/2021         Note: Above Lab results reviewed.  Recent Imaging Review  CUP PACEART REMOTE DEVICE CHECK ILR summary report received. Battery status OK. Normal device function. No new symptom, tachy, brady, or pause episodes. No new AF episodes. AF burden is 0% of the time.  Monthly summary reports and ROV/PRN  ML, CVRS Note: Reviewed        Physical Exam  General appearance:  Well nourished, well developed, and well hydrated. In no apparent acute distress Mental status: Alert, oriented x 3 (person, place, & time)       Respiratory: No evidence of acute respiratory distress Eyes: PERLA Vitals: BP (!) 144/87   Pulse (!) 102   Temp (!) 97.1 F (36.2 C)   Resp 17   Ht 5\' 11"  (1.803 m)   Wt 236 lb (107 kg)   SpO2 99%   BMI 32.92 kg/m  BMI: Estimated body mass index is 32.92 kg/m as calculated from the  following:   Height as of this encounter: 5\' 11"  (1.803 m).   Weight as of this encounter: 236 lb (107 kg). Ideal: Ideal body weight: 70.8 kg (156 lb 1.4 oz) Adjusted ideal body weight: 85.3 kg (188 lb 0.8 oz)  Lumbar Spine Area Exam  Skin & Axial Inspection: No masses, redness, or swelling Alignment: Symmetrical Functional ROM: Improved after treatment       Stability: No instability detected Muscle Tone/Strength: Functionally intact. No obvious neuro-muscular anomalies detected. Sensory (Neurological): Dermatomal pain pattern, left L4-L5   Gait & Posture Assessment  Ambulation: Limited Gait: Antalgic Posture: Difficulty standing up straight, due to pain    Lower Extremity Exam      Side: Right lower extremity   Side: Left lower extremity  Stability: No instability observed           Stability: No instability observed          Skin & Extremity Inspection: Skin color, temperature, and hair growth are WNL. No peripheral edema or cyanosis. No masses, redness, swelling, asymmetry, or associated skin lesions. No contractures.   Skin & Extremity Inspection: Skin color, temperature, and hair growth are WNL. No peripheral edema or cyanosis. No masses, redness, swelling, asymmetry, or associated skin lesions. No contractures.  Functional ROM: Unrestricted ROM                   Functional ROM: Decreased ROM for hip and knee joints          Muscle Tone/Strength: Functionally intact. No obvious neuro-muscular anomalies detected.   Muscle Tone/Strength: Functionally intact. No obvious neuro-muscular anomalies detected.  Sensory (Neurological): Unimpaired   Sensory (Neurological): Dermatomal pain pattern and arthropathic of left knee  Palpation: No palpable anomalies   Palpation: No palpable anomalies    Assessment   Diagnosis Status  1. 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 Lumbar radiculopathy   3. Cluneal neuropathy   4. Primary osteoarthritis of right shoulder    5. Lumbar spondylosis   6. Primary osteoarthritis of left hip   7. Piriformis syndrome of left side   8. Chronic pain syndrome   9. Cervical spondylosis without myelopathy   10. Lumbar degenerative disc disease    Persistent Persistent Persistent    Plan of Care    [name] has a current medication list which includes the following long-term medication(s): albuterol, allopurinol, cetirizine, levothyroxine, metoprolol tartrate, montelukast, omega-3 acid ethyl esters, omeprazole, potassium chloride sa, gabapentin, [DISCONTINUED] fluticasone, and [DISCONTINUED] mometasone.  Pharmacotherapy (Medications Ordered): Meds ordered this encounter  Medications   HYDROcodone-acetaminophen (NORCO) 7.5-325 MG tablet    Sig: Take 1 tablet by mouth every 8 (eight) hours as needed for severe pain (pain score 7-10). Must last 30 days.    Dispense:  90 tablet    Refill:  0    Chronic Pain: STOP Act (Not applicable) Fill 1 day early if  closed on refill date. Avoid benzodiazepines within 8 hours of opioids   HYDROcodone-acetaminophen (NORCO) 7.5-325 MG tablet    Sig: Take 1 tablet by mouth every 8 (eight) hours as needed for severe pain (pain score 7-10). Must last 30 days.    Dispense:  90 tablet    Refill:  0    Chronic Pain: STOP Act (Not applicable) Fill 1 day early if closed on refill date. Avoid benzodiazepines within 8 hours of opioids   HYDROcodone-acetaminophen (NORCO) 7.5-325 MG tablet    Sig: Take 1 tablet by mouth every 8 (eight) hours as needed for severe pain (pain score 7-10). Must last 30 days.    Dispense:  90 tablet    Refill:  0    Chronic Pain: STOP Act (Not applicable) Fill 1 day early if closed on refill date. Avoid benzodiazepines within 8 hours of opioids   gabapentin (NEURONTIN) 600 MG tablet    Sig: Take 1 tablet (600 mg total) by mouth at bedtime.    Dispense:  30 tablet    Refill:  5   tiZANidine (ZANAFLEX) 4 MG tablet    Sig: Take 1 tablet (4 mg total) by  mouth at bedtime.    Dispense:  60 tablet    Refill:  5    Orders:  No orders of the defined types were placed in this encounter.  Follow-up plan:   No follow-ups on file.      Recent Visits Date Type Provider Dept  10/14/22 Office Visit Edward Jolly, MD [facility]  Showing recent visits within past 90 days and meeting all other requirements Today's Visits Date Type Provider Dept  01/08/23 Office Visit Edward Jolly, MD [facility]  Showing today's visits and meeting all other requirements Future Appointments No visits were found meeting these conditions. Showing future appointments within next 90 days and meeting all other requirements  I discussed the assessment and treatment plan with the patient. The patient was provided an opportunity to ask questions and all were answered. The patient agreed with the plan and demonstrated an understanding of the instructions.  Patient advised to call back or seek an in-person evaluation if the symptoms or condition worsens.  Duration of encounter: .  Total time on encounter, as per AMA guidelines included both the face-to-face and non-face-to-face time personally spent by the physician and/or other qualified health care professional(s) on the day of the encounter (includes time in activities that require the physician or other qualified health care professional and does not include time in activities normally performed by clinical staff). Physician's time may include the following activities when performed: Preparing to see the patient (e.g., pre-charting review of records, searching for previously ordered imaging, lab work, and nerve conduction tests) Review of prior analgesic pharmacotherapies. Reviewing PMP Interpreting ordered tests (e.g., lab work, imaging, nerve conduction tests) Performing post-procedure evaluations, including interpretation of diagnostic procedures Obtaining and/or reviewing separately  obtained history Performing a medically appropriate examination and/or evaluation Counseling and educating the patient/family/caregiver Ordering medications, tests, or procedures Referring and communicating with other health care professionals (when not separately reported) Documenting clinical information in the electronic or other health record Independently interpreting results (not separately reported) and communicating results to the patient/ family/caregiver Care coordination (not separately reported)  Note by: Edward Jolly, MD Date: 01/08/2023; Time: 3:33 PM

## 2023-01-12 NOTE — Progress Notes (Signed)
Carelink Summary Report / Loop Recorder 

## 2023-01-20 ENCOUNTER — Other Ambulatory Visit: Payer: Self-pay | Admitting: Internal Medicine

## 2023-01-25 LAB — CUP PACEART REMOTE DEVICE CHECK
Date Time Interrogation Session: 20241129230553
Implantable Pulse Generator Implant Date: 20230629

## 2023-01-26 ENCOUNTER — Ambulatory Visit: Payer: Medicare Other

## 2023-01-26 DIAGNOSIS — R002 Palpitations: Secondary | ICD-10-CM

## 2023-01-26 DIAGNOSIS — I5032 Chronic diastolic (congestive) heart failure: Secondary | ICD-10-CM

## 2023-01-28 ENCOUNTER — Inpatient Hospital Stay: Payer: Medicare Other

## 2023-01-28 ENCOUNTER — Inpatient Hospital Stay: Payer: Medicare Other | Attending: Oncology

## 2023-01-28 DIAGNOSIS — D631 Anemia in chronic kidney disease: Secondary | ICD-10-CM | POA: Diagnosis not present

## 2023-01-28 DIAGNOSIS — N1832 Chronic kidney disease, stage 3b: Secondary | ICD-10-CM | POA: Insufficient documentation

## 2023-01-28 DIAGNOSIS — I129 Hypertensive chronic kidney disease with stage 1 through stage 4 chronic kidney disease, or unspecified chronic kidney disease: Secondary | ICD-10-CM | POA: Diagnosis not present

## 2023-01-28 LAB — HEMOGLOBIN AND HEMATOCRIT (CANCER CENTER ONLY)
HCT: 33.7 % — ABNORMAL LOW (ref 36.0–46.0)
Hemoglobin: 10.7 g/dL — ABNORMAL LOW (ref 12.0–15.0)

## 2023-01-28 NOTE — Progress Notes (Signed)
Hgb is 10.7 today; will hold retacrit

## 2023-01-30 DIAGNOSIS — E669 Obesity, unspecified: Secondary | ICD-10-CM | POA: Diagnosis not present

## 2023-01-30 DIAGNOSIS — I10 Essential (primary) hypertension: Secondary | ICD-10-CM | POA: Diagnosis not present

## 2023-01-30 DIAGNOSIS — J45909 Unspecified asthma, uncomplicated: Secondary | ICD-10-CM | POA: Diagnosis not present

## 2023-01-30 DIAGNOSIS — M069 Rheumatoid arthritis, unspecified: Secondary | ICD-10-CM | POA: Diagnosis not present

## 2023-01-30 DIAGNOSIS — R0609 Other forms of dyspnea: Secondary | ICD-10-CM | POA: Diagnosis not present

## 2023-01-30 DIAGNOSIS — N1832 Chronic kidney disease, stage 3b: Secondary | ICD-10-CM | POA: Diagnosis not present

## 2023-01-30 DIAGNOSIS — D631 Anemia in chronic kidney disease: Secondary | ICD-10-CM | POA: Diagnosis not present

## 2023-01-30 DIAGNOSIS — R001 Bradycardia, unspecified: Secondary | ICD-10-CM | POA: Diagnosis not present

## 2023-01-30 DIAGNOSIS — E782 Mixed hyperlipidemia: Secondary | ICD-10-CM | POA: Diagnosis not present

## 2023-01-30 DIAGNOSIS — K219 Gastro-esophageal reflux disease without esophagitis: Secondary | ICD-10-CM | POA: Diagnosis not present

## 2023-02-09 ENCOUNTER — Other Ambulatory Visit: Payer: Self-pay

## 2023-02-09 DIAGNOSIS — E039 Hypothyroidism, unspecified: Secondary | ICD-10-CM

## 2023-02-09 MED ORDER — LEVOTHYROXINE SODIUM 137 MCG PO TABS: 137.0000 ug | ORAL_TABLET | Freq: Every day | ORAL | 0 refills | Status: DC

## 2023-02-26 ENCOUNTER — Inpatient Hospital Stay: Payer: 59

## 2023-02-26 ENCOUNTER — Inpatient Hospital Stay: Payer: 59 | Attending: Oncology

## 2023-02-26 DIAGNOSIS — M069 Rheumatoid arthritis, unspecified: Secondary | ICD-10-CM | POA: Diagnosis not present

## 2023-02-26 DIAGNOSIS — Z79899 Other long term (current) drug therapy: Secondary | ICD-10-CM | POA: Insufficient documentation

## 2023-02-26 DIAGNOSIS — Z791 Long term (current) use of non-steroidal anti-inflammatories (NSAID): Secondary | ICD-10-CM | POA: Diagnosis not present

## 2023-02-26 DIAGNOSIS — I130 Hypertensive heart and chronic kidney disease with heart failure and stage 1 through stage 4 chronic kidney disease, or unspecified chronic kidney disease: Secondary | ICD-10-CM | POA: Diagnosis not present

## 2023-02-26 DIAGNOSIS — Z803 Family history of malignant neoplasm of breast: Secondary | ICD-10-CM | POA: Diagnosis not present

## 2023-02-26 DIAGNOSIS — Z7984 Long term (current) use of oral hypoglycemic drugs: Secondary | ICD-10-CM | POA: Diagnosis not present

## 2023-02-26 DIAGNOSIS — R195 Other fecal abnormalities: Secondary | ICD-10-CM | POA: Diagnosis not present

## 2023-02-26 DIAGNOSIS — M3500 Sicca syndrome, unspecified: Secondary | ICD-10-CM | POA: Diagnosis not present

## 2023-02-26 DIAGNOSIS — L4050 Arthropathic psoriasis, unspecified: Secondary | ICD-10-CM | POA: Insufficient documentation

## 2023-02-26 DIAGNOSIS — Z8051 Family history of malignant neoplasm of kidney: Secondary | ICD-10-CM | POA: Insufficient documentation

## 2023-02-26 DIAGNOSIS — K621 Rectal polyp: Secondary | ICD-10-CM | POA: Insufficient documentation

## 2023-02-26 DIAGNOSIS — K31811 Angiodysplasia of stomach and duodenum with bleeding: Secondary | ICD-10-CM | POA: Diagnosis not present

## 2023-02-26 DIAGNOSIS — R809 Proteinuria, unspecified: Secondary | ICD-10-CM | POA: Insufficient documentation

## 2023-02-26 DIAGNOSIS — E782 Mixed hyperlipidemia: Secondary | ICD-10-CM | POA: Insufficient documentation

## 2023-02-26 DIAGNOSIS — G894 Chronic pain syndrome: Secondary | ICD-10-CM | POA: Diagnosis not present

## 2023-02-26 DIAGNOSIS — K219 Gastro-esophageal reflux disease without esophagitis: Secondary | ICD-10-CM | POA: Diagnosis not present

## 2023-02-26 DIAGNOSIS — Z79890 Hormone replacement therapy (postmenopausal): Secondary | ICD-10-CM | POA: Insufficient documentation

## 2023-02-26 DIAGNOSIS — K449 Diaphragmatic hernia without obstruction or gangrene: Secondary | ICD-10-CM | POA: Diagnosis not present

## 2023-02-26 DIAGNOSIS — I5032 Chronic diastolic (congestive) heart failure: Secondary | ICD-10-CM | POA: Diagnosis not present

## 2023-02-26 DIAGNOSIS — D631 Anemia in chronic kidney disease: Secondary | ICD-10-CM | POA: Diagnosis not present

## 2023-02-26 DIAGNOSIS — N1832 Chronic kidney disease, stage 3b: Secondary | ICD-10-CM | POA: Diagnosis not present

## 2023-02-26 DIAGNOSIS — Z87442 Personal history of urinary calculi: Secondary | ICD-10-CM | POA: Diagnosis not present

## 2023-02-26 DIAGNOSIS — E039 Hypothyroidism, unspecified: Secondary | ICD-10-CM | POA: Diagnosis not present

## 2023-02-26 DIAGNOSIS — Z87891 Personal history of nicotine dependence: Secondary | ICD-10-CM | POA: Insufficient documentation

## 2023-02-26 LAB — HEMOGLOBIN AND HEMATOCRIT (CANCER CENTER ONLY)
HCT: 32.8 % — ABNORMAL LOW (ref 36.0–46.0)
Hemoglobin: 10.5 g/dL — ABNORMAL LOW (ref 12.0–15.0)

## 2023-02-26 NOTE — Progress Notes (Unsigned)
 Patient did not need Retacrit today.

## 2023-03-02 ENCOUNTER — Ambulatory Visit (INDEPENDENT_AMBULATORY_CARE_PROVIDER_SITE_OTHER): Payer: Medicare Other

## 2023-03-02 ENCOUNTER — Telehealth: Payer: Self-pay

## 2023-03-02 DIAGNOSIS — R002 Palpitations: Secondary | ICD-10-CM

## 2023-03-02 DIAGNOSIS — L2089 Other atopic dermatitis: Secondary | ICD-10-CM | POA: Diagnosis not present

## 2023-03-02 DIAGNOSIS — Z79899 Other long term (current) drug therapy: Secondary | ICD-10-CM | POA: Diagnosis not present

## 2023-03-02 NOTE — Telephone Encounter (Signed)
 She needs a refill on gabapentin

## 2023-03-03 LAB — CUP PACEART REMOTE DEVICE CHECK
Date Time Interrogation Session: 20250105231111
Implantable Pulse Generator Implant Date: 20230629

## 2023-03-12 ENCOUNTER — Ambulatory Visit: Payer: Self-pay

## 2023-03-12 NOTE — Telephone Encounter (Signed)
Chief Complaint: Eye swelling Symptoms: Left eye swelling, pain in the eye and small amount of discharge from the eye, warm to touch Frequency: Constant onset yesterday Pertinent Negatives: Patient denies fever Disposition: [] ED /[] Urgent Care (no appt availability in office) / [] Appointment(In office/virtual)/ []  [facility]/ [] Home Care/ [] Refused Recommended Disposition /[] [facility]/ [x]  Follow-up with PCP Additional Notes: Patient states she had some discomfort in her eye all week and yesterday she noticed that the left eye was swollen and red. Patient states the eyelid is warm to touch as well. Patient stated last time this happen she was given eye drops to treat a stye. Care advice was given and patient was offered an appointment tomorrow. Patient stated she can not do an appointment because she fell on Saturday due to ice on her ramp in front of the house. Patient states it is still icey. Patient requesting PCP send a prescription since she is having a hard time with the cold weather and ice.   Summary: Stye on eye-Request for medication   Pt is calling in because she has a stye on her eye and wants to know if Dr. Yetta Barre can send her in some medication for it.     Reason for Disposition  MODERATE-SEVERE eyelid swelling on one side  (Exception: Due to a mosquito bite.)  Answer Assessment - Initial Assessment Questions 1. ONSET: "When did the swelling start?" (e.g., minutes, hours, days)     Yesterday  2. LOCATION: "What part of the eyelids is swollen?"     Left eye 3. SEVERITY: "How swollen is it?"     It's almost shut closed  4. ITCHING: "Is there any itching?" If Yes, ask: "How much?"   (Scale 1-10; mild, moderate or severe)     No  5. PAIN: "Is the swelling painful to touch?" If Yes, ask: "How painful is it?"   (Scale 1-10; mild, moderate or severe)     7/10 6. FEVER: "Do you have a fever?" If Yes, ask: "What is it, how was it measured, and when did it  start?"      No  7. CAUSE: "What do you think is causing the swelling?"     I think it is stye  8. RECURRENT SYMPTOM: "Have you had eyelid swelling before?" If Yes, ask: "When was the last time?" "What happened that time?"     Yes, I had an infection in the eyelid  9. OTHER SYMPTOMS: "Do you have any other symptoms?" (e.g., blurred vision, eye discharge, rash, runny nose)     Red, swollen, warm to touch, a little eye discharge  Protocols used: Eye - Swelling-A-AH

## 2023-03-12 NOTE — Telephone Encounter (Signed)
Called pt told her Dr. Yetta Barre is not in the office until Monday, 03/16/23. Told pt she would need to be seen before we can send in any medications.. Pt stated that she would go to UC and abruptly hung up the phone.

## 2023-03-13 ENCOUNTER — Ambulatory Visit: Payer: Self-pay | Admitting: Family Medicine

## 2023-03-13 ENCOUNTER — Encounter: Payer: Self-pay | Admitting: Oncology

## 2023-03-13 NOTE — Telephone Encounter (Signed)
Patient stated "Nevermind now.  I have talked to people yesterday and got called twice today and nevermind now." She stated that she would talk to someone when she came in for her Appointment in Feb.  Patient was very unhappy and hung up before this RN could speak anymore. Reason for Disposition . Caller hangs up  Protocols used: Difficult Call-A-AH

## 2023-03-13 NOTE — Telephone Encounter (Signed)
Copied from CRM [id number]. Topic: Clinical - Red Word Triage >> Mar 13, 2023 11:18 AM Truddie Crumble wrote: Red Word that prompted transfer to Nurse Triage: Pt stated she fell last Saturday and  hurt her side. I tried to get the pt over to a nurse but she disconnected the call  This RN called the patient back. Patient answered the call but hung up after I introduced myself. Will route for call back.

## 2023-03-13 NOTE — Telephone Encounter (Signed)
Called and left voicemail for patient to call back.

## 2023-03-14 ENCOUNTER — Encounter: Payer: Self-pay | Admitting: Oncology

## 2023-03-14 ENCOUNTER — Emergency Department: Payer: 59

## 2023-03-14 ENCOUNTER — Other Ambulatory Visit: Payer: Self-pay

## 2023-03-14 DIAGNOSIS — M542 Cervicalgia: Secondary | ICD-10-CM | POA: Insufficient documentation

## 2023-03-14 DIAGNOSIS — M25572 Pain in left ankle and joints of left foot: Secondary | ICD-10-CM | POA: Diagnosis not present

## 2023-03-14 DIAGNOSIS — S0990XA Unspecified injury of head, initial encounter: Secondary | ICD-10-CM | POA: Insufficient documentation

## 2023-03-14 DIAGNOSIS — I129 Hypertensive chronic kidney disease with stage 1 through stage 4 chronic kidney disease, or unspecified chronic kidney disease: Secondary | ICD-10-CM | POA: Diagnosis not present

## 2023-03-14 DIAGNOSIS — R0789 Other chest pain: Secondary | ICD-10-CM | POA: Insufficient documentation

## 2023-03-14 DIAGNOSIS — M25562 Pain in left knee: Secondary | ICD-10-CM | POA: Diagnosis not present

## 2023-03-14 DIAGNOSIS — M25561 Pain in right knee: Secondary | ICD-10-CM | POA: Diagnosis not present

## 2023-03-14 DIAGNOSIS — W01198A Fall on same level from slipping, tripping and stumbling with subsequent striking against other object, initial encounter: Secondary | ICD-10-CM | POA: Diagnosis not present

## 2023-03-14 DIAGNOSIS — G319 Degenerative disease of nervous system, unspecified: Secondary | ICD-10-CM | POA: Diagnosis not present

## 2023-03-14 DIAGNOSIS — M549 Dorsalgia, unspecified: Secondary | ICD-10-CM | POA: Diagnosis not present

## 2023-03-14 DIAGNOSIS — Y9301 Activity, walking, marching and hiking: Secondary | ICD-10-CM | POA: Diagnosis not present

## 2023-03-14 DIAGNOSIS — M4312 Spondylolisthesis, cervical region: Secondary | ICD-10-CM | POA: Diagnosis not present

## 2023-03-14 DIAGNOSIS — I700 Atherosclerosis of aorta: Secondary | ICD-10-CM | POA: Diagnosis not present

## 2023-03-14 DIAGNOSIS — R079 Chest pain, unspecified: Secondary | ICD-10-CM | POA: Diagnosis not present

## 2023-03-14 DIAGNOSIS — N189 Chronic kidney disease, unspecified: Secondary | ICD-10-CM | POA: Diagnosis not present

## 2023-03-14 DIAGNOSIS — I6782 Cerebral ischemia: Secondary | ICD-10-CM | POA: Diagnosis not present

## 2023-03-14 LAB — CBC
HCT: 32.9 % — ABNORMAL LOW (ref 36.0–46.0)
Hemoglobin: 10.6 g/dL — ABNORMAL LOW (ref 12.0–15.0)
MCH: 29.4 pg (ref 26.0–34.0)
MCHC: 32.2 g/dL (ref 30.0–36.0)
MCV: 91.4 fL (ref 80.0–100.0)
Platelets: 187 10*3/uL (ref 150–400)
RBC: 3.6 MIL/uL — ABNORMAL LOW (ref 3.87–5.11)
RDW: 15.8 % — ABNORMAL HIGH (ref 11.5–15.5)
WBC: 5.3 10*3/uL (ref 4.0–10.5)
nRBC: 0 % (ref 0.0–0.2)

## 2023-03-14 LAB — BASIC METABOLIC PANEL
Anion gap: 11 (ref 5–15)
BUN: 43 mg/dL — ABNORMAL HIGH (ref 8–23)
CO2: 26 mmol/L (ref 22–32)
Calcium: 9.9 mg/dL (ref 8.9–10.3)
Chloride: 103 mmol/L (ref 98–111)
Creatinine, Ser: 2.15 mg/dL — ABNORMAL HIGH (ref 0.44–1.00)
GFR, Estimated: 24 mL/min — ABNORMAL LOW (ref >60)
Glucose, Bld: 87 mg/dL (ref 70–99)
Potassium: 3.7 mmol/L (ref 3.5–5.1)
Sodium: 140 mmol/L (ref 135–145)

## 2023-03-14 NOTE — ED Triage Notes (Signed)
Pt to ed from home via POV for fall. Pt slipped on the ice last Saturday. Pt called 911 that day but didn't come to the ER. Pt is caox4, and walking around the lobby in no distress. Pt also has a stye on her left eye. Pt complaint of pain in her back, neck, ribs (left), left ankle.

## 2023-03-15 ENCOUNTER — Emergency Department
Admission: EM | Admit: 2023-03-15 | Discharge: 2023-03-15 | Disposition: A | Discharge status: Home / Self Care | Payer: 59 | Attending: Emergency Medicine | Admitting: Emergency Medicine

## 2023-03-15 ENCOUNTER — Emergency Department: Payer: 59

## 2023-03-15 DIAGNOSIS — R079 Chest pain, unspecified: Secondary | ICD-10-CM | POA: Diagnosis not present

## 2023-03-15 DIAGNOSIS — S0990XA Unspecified injury of head, initial encounter: Secondary | ICD-10-CM

## 2023-03-15 DIAGNOSIS — R0789 Other chest pain: Secondary | ICD-10-CM

## 2023-03-15 DIAGNOSIS — I700 Atherosclerosis of aorta: Secondary | ICD-10-CM | POA: Diagnosis not present

## 2023-03-15 DIAGNOSIS — W19XXXA Unspecified fall, initial encounter: Secondary | ICD-10-CM

## 2023-03-15 MED ORDER — LIDOCAINE 5 % EX PTCH
1.0000 | MEDICATED_PATCH | CUTANEOUS | Status: DC
  Administered 2023-03-15: 1 via TRANSDERMAL
  Filled 2023-03-15: qty 1

## 2023-03-15 MED ORDER — ACETAMINOPHEN 500 MG PO TABS
1000.0000 mg | ORAL_TABLET | Freq: Once | ORAL | Status: AC
  Administered 2023-03-15: 1000 mg via ORAL
  Filled 2023-03-15: qty 2

## 2023-03-15 MED ORDER — ERYTHROMYCIN 5 MG/GM OP OINT: 1.0000 | TOPICAL_OINTMENT | Freq: Three times a day (TID) | OPHTHALMIC | 0 refills | Status: DC

## 2023-03-15 NOTE — ED Provider Notes (Addendum)
[facility] Provider Note    Event Date/Time   First MD Initiated Contact with Patient 03/15/23 [phone]     (approximate)   History   Chief Complaint Fall   HPI  Michele Moore is a 73 y.o. female With past medical history of hypertension, anemia, rheumatoid arthritis, and CKD who presents to the ED following fall.  Patient reports that 1 week ago she was walking outside when she slipped on the ice, fell backwards striking her head.  She denies any loss of consciousness and does not take any blood thinners.  She does report that she has been dealing with pain to the back of her head as well as her neck and left chest since the fall.  She also complains of pain to her left ankle, but has been ambulatory with her walker since the fall.  She denies any pain in her abdomen or upper extremities.     Physical Exam   Triage Vital Signs: ED Triage Vitals  Encounter Vitals Group     BP 03/14/23 2235 (!) 143/82     Systolic BP Percentile --      Diastolic BP Percentile --      Pulse Rate 03/14/23 2235 74     Resp 03/14/23 2235 19     Temp 03/14/23 2235 98.5 F (36.9 C)     Temp Source 03/14/23 2235 Oral     SpO2 03/14/23 2235 100 %     Weight --      Height 03/14/23 2146 5\' 11"  (1.803 m)     Head Circumference --      Peak Flow --      Pain Score 03/14/23 2146 10     Pain Loc --      Pain Education --      Exclude from Growth Chart --     Most recent vital signs: Vitals:   03/14/23 2235  BP: (!) 143/82  Pulse: 74  Resp: 19  Temp: 98.5 F (36.9 C)  SpO2: 100%    Constitutional: Alert and oriented. Eyes: Conjunctivae are normal. Head: Atraumatic. Nose: No congestion/rhinnorhea. Mouth/Throat: Mucous membranes are moist.  Neck: Midline cervical spine tenderness to palpation. Cardiovascular: Normal rate, regular rhythm. Grossly normal heart sounds.  2+ radial pulses bilaterally. Respiratory: Normal respiratory effort.  No retractions. Lungs CTAB.   Left chest wall tenderness to palpation noted. Gastrointestinal: Soft and nontender. No distention. Musculoskeletal: No upper extremity bony tenderness to palpation.  Diffuse tenderness to palpation at left ankle with no tenderness at right ankle, bilateral knees or hips. Neurologic:  Normal speech and language. No gross focal neurologic deficits are appreciated.    ED Results / Procedures / Treatments   Labs (all labs ordered are listed, but only abnormal results are displayed) Labs Reviewed  CBC - Abnormal; Notable for the following components:      Result Value   RBC 3.60 (*)    Hemoglobin 10.6 (*)    HCT 32.9 (*)    RDW 15.8 (*)    All other components within normal limits  BASIC METABOLIC PANEL - Abnormal; Notable for the following components:   BUN 43 (*)    Creatinine, Ser 2.15 (*)    GFR, Estimated 24 (*)    All other components within normal limits    RADIOLOGY CT head reviewed and interpreted by me with no hemorrhage or midline shift.  PROCEDURES:  Critical Care performed: No  Procedures   MEDICATIONS ORDERED IN  ED: Medications  lidocaine (LIDODERM) 5 % 1 patch (1 patch Transdermal Patch Applied 03/15/23 0211)  acetaminophen (TYLENOL) tablet 1,000 mg (1,000 mg Oral Given 03/15/23 0210)     IMPRESSION / MDM / ASSESSMENT AND PLAN / ED COURSE  I reviewed the triage vital signs and the nursing notes.                              73 y.o. female with past medical history of hypertension, rheumatoid arthritis, CKD, and anemia who presents to the ED complaining of fall 1 week ago striking her head and chest.  Patient's presentation is most consistent with acute presentation with potential threat to life or bodily function.  Differential diagnosis includes, but is not limited to, intracranial injury, cervical spine injury, rib fracture, hemothorax, pneumothorax, ankle injury.  Patient nontoxic-appearing and in no acute distress, vital signs are unremarkable.  CT  head and cervical spine performed from triage and are negative for acute process, left ankle x-ray is also unremarkable.  Patient with some left chest wall tenderness and we will further assess with chest x-ray, no abdominal tenderness noted.  Labs show mild AKI but otherwise reassuring with no significant electrolyte abnormality, anemia, or leukocytosis.  We will treat symptomatically with Tylenol and Lidoderm patch, reassess following chest x-ray.  Chest x-ray is unremarkable, pain improved on reassessment and patient ambulatory without difficulty.  She is appropriate for discharge home with outpatient follow-up, was counseled to have renal function rechecked with her PCP.  We will treat stye in her left eye with topical erythromycin, she was also counseled to use warm compresses.  Patient agrees with plan.      FINAL CLINICAL IMPRESSION(S) / ED DIAGNOSES   Final diagnoses:  Fall, initial encounter  Chest wall pain  Injury of head, initial encounter     Rx / DC Orders   ED Discharge Orders     None        Note:  This document was prepared using Dragon voice recognition software and may include unintentional dictation errors.   Chesley Noon, MD 03/15/23 1610    Chesley Noon, MD 03/15/23 [phone]

## 2023-03-23 ENCOUNTER — Inpatient Hospital Stay: Payer: 59

## 2023-03-24 ENCOUNTER — Telehealth: Payer: Self-pay

## 2023-03-24 NOTE — Telephone Encounter (Signed)
The patient left a vm stating she has called three times to get her gabapentin refilled and no one has called her, and it has not been called out to [facility] in [place].

## 2023-03-24 NOTE — Telephone Encounter (Signed)
Spoke with pharmacist and they found the 5 refills and will get it filled for the patient.

## 2023-03-25 ENCOUNTER — Inpatient Hospital Stay: Payer: 59

## 2023-03-25 ENCOUNTER — Encounter: Payer: Self-pay | Admitting: Oncology

## 2023-03-25 ENCOUNTER — Inpatient Hospital Stay (HOSPITAL_BASED_OUTPATIENT_CLINIC_OR_DEPARTMENT_OTHER): Payer: 59 | Admitting: Oncology

## 2023-03-25 VITALS — BP 129/76 | HR 77 | Temp 97.7°F | Resp 18 | Wt 261.1 lb

## 2023-03-25 DIAGNOSIS — Z79899 Other long term (current) drug therapy: Secondary | ICD-10-CM | POA: Diagnosis not present

## 2023-03-25 DIAGNOSIS — Z87442 Personal history of urinary calculi: Secondary | ICD-10-CM | POA: Diagnosis not present

## 2023-03-25 DIAGNOSIS — L4050 Arthropathic psoriasis, unspecified: Secondary | ICD-10-CM | POA: Diagnosis not present

## 2023-03-25 DIAGNOSIS — Z803 Family history of malignant neoplasm of breast: Secondary | ICD-10-CM | POA: Diagnosis not present

## 2023-03-25 DIAGNOSIS — E782 Mixed hyperlipidemia: Secondary | ICD-10-CM | POA: Diagnosis not present

## 2023-03-25 DIAGNOSIS — K31811 Angiodysplasia of stomach and duodenum with bleeding: Secondary | ICD-10-CM | POA: Diagnosis not present

## 2023-03-25 DIAGNOSIS — N1832 Chronic kidney disease, stage 3b: Secondary | ICD-10-CM

## 2023-03-25 DIAGNOSIS — K621 Rectal polyp: Secondary | ICD-10-CM | POA: Diagnosis not present

## 2023-03-25 DIAGNOSIS — K219 Gastro-esophageal reflux disease without esophagitis: Secondary | ICD-10-CM | POA: Diagnosis not present

## 2023-03-25 DIAGNOSIS — Z791 Long term (current) use of non-steroidal anti-inflammatories (NSAID): Secondary | ICD-10-CM | POA: Diagnosis not present

## 2023-03-25 DIAGNOSIS — M3500 Sicca syndrome, unspecified: Secondary | ICD-10-CM | POA: Diagnosis not present

## 2023-03-25 DIAGNOSIS — R195 Other fecal abnormalities: Secondary | ICD-10-CM | POA: Diagnosis not present

## 2023-03-25 DIAGNOSIS — D649 Anemia, unspecified: Secondary | ICD-10-CM

## 2023-03-25 DIAGNOSIS — Z87891 Personal history of nicotine dependence: Secondary | ICD-10-CM | POA: Diagnosis not present

## 2023-03-25 DIAGNOSIS — D631 Anemia in chronic kidney disease: Secondary | ICD-10-CM | POA: Diagnosis not present

## 2023-03-25 DIAGNOSIS — G894 Chronic pain syndrome: Secondary | ICD-10-CM | POA: Diagnosis not present

## 2023-03-25 DIAGNOSIS — R809 Proteinuria, unspecified: Secondary | ICD-10-CM | POA: Diagnosis not present

## 2023-03-25 DIAGNOSIS — N183 Chronic kidney disease, stage 3 unspecified: Secondary | ICD-10-CM

## 2023-03-25 DIAGNOSIS — I5032 Chronic diastolic (congestive) heart failure: Secondary | ICD-10-CM | POA: Diagnosis not present

## 2023-03-25 DIAGNOSIS — E039 Hypothyroidism, unspecified: Secondary | ICD-10-CM | POA: Diagnosis not present

## 2023-03-25 DIAGNOSIS — I130 Hypertensive heart and chronic kidney disease with heart failure and stage 1 through stage 4 chronic kidney disease, or unspecified chronic kidney disease: Secondary | ICD-10-CM | POA: Diagnosis not present

## 2023-03-25 DIAGNOSIS — Z7984 Long term (current) use of oral hypoglycemic drugs: Secondary | ICD-10-CM | POA: Diagnosis not present

## 2023-03-25 DIAGNOSIS — M069 Rheumatoid arthritis, unspecified: Secondary | ICD-10-CM | POA: Diagnosis not present

## 2023-03-25 DIAGNOSIS — Z8051 Family history of malignant neoplasm of kidney: Secondary | ICD-10-CM | POA: Diagnosis not present

## 2023-03-25 DIAGNOSIS — K449 Diaphragmatic hernia without obstruction or gangrene: Secondary | ICD-10-CM | POA: Diagnosis not present

## 2023-03-25 LAB — CBC WITH DIFFERENTIAL (CANCER CENTER ONLY)
Abs Immature Granulocytes: 0.04 10*3/uL (ref 0.00–0.07)
Basophils Absolute: 0 10*3/uL (ref 0.0–0.1)
Basophils Relative: 0 %
Eosinophils Absolute: 0 10*3/uL (ref 0.0–0.5)
Eosinophils Relative: 1 %
HCT: 31.1 % — ABNORMAL LOW (ref 36.0–46.0)
Hemoglobin: 10 g/dL — ABNORMAL LOW (ref 12.0–15.0)
Immature Granulocytes: 1 %
Lymphocytes Relative: 32 %
Lymphs Abs: 1.4 10*3/uL (ref 0.7–4.0)
MCH: 28.7 pg (ref 26.0–34.0)
MCHC: 32.2 g/dL (ref 30.0–36.0)
MCV: 89.4 fL (ref 80.0–100.0)
Monocytes Absolute: 0.4 10*3/uL (ref 0.1–1.0)
Monocytes Relative: 8 %
Neutro Abs: 2.6 10*3/uL (ref 1.7–7.7)
Neutrophils Relative %: 58 %
Platelet Count: 170 10*3/uL (ref 150–400)
RBC: 3.48 MIL/uL — ABNORMAL LOW (ref 3.87–5.11)
RDW: 15.9 % — ABNORMAL HIGH (ref 11.5–15.5)
WBC Count: 4.5 10*3/uL (ref 4.0–10.5)
nRBC: 0 % (ref 0.0–0.2)

## 2023-03-25 LAB — RETIC PANEL
Immature Retic Fract: 18.5 % — ABNORMAL HIGH (ref 2.3–15.9)
RBC.: 3.45 MIL/uL — ABNORMAL LOW (ref 3.87–5.11)
Retic Count, Absolute: 58 10*3/uL (ref 19.0–186.0)
Retic Ct Pct: 1.7 % (ref 0.4–3.1)
Reticulocyte Hemoglobin: 30.8 pg (ref >27.9)

## 2023-03-25 LAB — IRON AND TIBC
Iron: 82 ug/dL (ref 28–170)
Saturation Ratios: 22 % (ref 10.4–31.8)
TIBC: 372 ug/dL (ref 250–450)
UIBC: 290 ug/dL

## 2023-03-25 LAB — FERRITIN: Ferritin: 36 ng/mL (ref 11–307)

## 2023-03-25 MED ORDER — EPOETIN ALFA-EPBX 20000 UNIT/ML IJ SOLN
20000.0000 [IU] | Freq: Once | INTRAMUSCULAR | Status: AC
Start: 2023-03-25 — End: 2023-03-25
  Administered 2023-03-25: 20000 [IU] via SUBCUTANEOUS
  Filled 2023-03-25: qty 1

## 2023-03-25 NOTE — Assessment & Plan Note (Signed)
Labs reviewed and discussed with patient.  Hemoglobin remains low despite Venofer and EPO.   Lab Results  Component Value Date   HGB 10.0 (L) 03/25/2023   TIBC 351 12/01/2022   IRONPCTSAT 24 12/01/2022   FERRITIN 30 12/01/2022   Proceed with Retacrit today Ferritin <200, recommend Venofer treatments to further increase iron level.  . Recommend IV Venofer weekly x2 After that we will monitor H&H every 4 weeks, proceed with Retacrit if hemoglobin less than 10.

## 2023-03-25 NOTE — Assessment & Plan Note (Signed)
Encourage oral hydration and avoid nephrotoxins.

## 2023-03-25 NOTE — Progress Notes (Signed)
Hematology/Oncology Progress note [phone] [phone]       Clinic Day:  03/25/2023  ASSESSMENT & PLAN:   Anemia due to stage 3b chronic kidney disease (HCC) Labs reviewed and discussed with patient.  Hemoglobin remains low despite Venofer and EPO.   Lab Results  Component Value Date   HGB 10.0 (L) 03/25/2023   TIBC 351 12/01/2022   IRONPCTSAT 24 12/01/2022   FERRITIN 30 12/01/2022   Proceed with Retacrit today Ferritin <200, recommend Venofer treatments to further increase iron level.  . Recommend IV Venofer weekly x2 After that we will monitor H&H every 4 weeks, proceed with Retacrit if hemoglobin less than 10.   CKD (chronic kidney disease), stage III (HCC) Encourage oral hydration and avoid nephrotoxins.     Orders Placed This Encounter  Procedures   CBC with Differential (Cancer Center Only)    Standing Status:   Future    Expected Date:   07/15/2023    Expiration Date:   03/24/2024   Iron and TIBC    Standing Status:   Future    Expected Date:   07/15/2023    Expiration Date:   03/24/2024   Ferritin    Standing Status:   Future    Expected Date:   07/15/2023    Expiration Date:   03/24/2024   Retic Panel    Standing Status:   Future    Expected Date:   07/15/2023    Expiration Date:   03/24/2024   Hemoglobin and Hematocrit (Cancer Center Only)    Standing Status:   Standing    Number of Occurrences:   3    Expiration Date:   03/24/2024  Follow-up Per LOS All questions were answered. The patient knows to call the clinic with any problems, questions or concerns.  Rickard Patience, MD, PhD [facility] Hematology Oncology 03/25/2023    Chief Complaint: Michele Moore is a 73 y.o. female presents for anemia in stage IIIb chronic kidney disease   PERTINENT ONCOLOGY HISTORY Michele Moore is a 73 y.o.afemale who has above oncology history reviewed by me today presented for follow up visit for management of anemia in CKD  PERTINENT HEMATOLOGY  HISTORY Patient previously followed up by Dr.Corcoran, patient switched care to me on 08/30/20 Extensive medical record review was performed by me  # anemia in stage IIIb chronic kidney disease.  Work-up on 04/23/2020 revealed a hematocrit of 35.0, hemoglobin 11.4, MCV 92.3, platelets 168,000, WBC 4,600 with an ANC of 3000. Ferritin was 107 with an iron saturation of 37% and a TIBC of 315. Sed rate was 54 and CRP was 0.9.  Vitamin B12 was 429 and folate >100.0.   10/22/2017 Colonoscopy for heme positive stool revealed one 7 mm polyp in the ascending colon, one 3 mm polyp in the transverse colon, and three 1 to 4 mm polyps in the sigmoid colon. There were non-bleeding internal hemorrhoids. There were petechia(e) in the rectum. Pathology showed two tubular adenomas and one hyperplastic polyp. EGD on 10/22/2017 showed gastritis and one gastric polyp.  She has Sjogren's syndrome, rheumatoid arthritis, hypertension, and proteinuria.  She takes oral iron once a day.  Patient follows up with pain clinic for pain management.  She previously tolerated IV Venofer treatments. 08/14/2022 patient underwent EGD and colonoscopy. Findings included 3 small polyps in the colon, nonbleeding hemorrhoids.  Gastric antral vascular ectasia with bleeding, treated with APC.  Small hiatal hernia.  INTERVAL HISTORY Michele Moore is a 73  y.o. female who has above history reviewed by me today presents for follow up visit for anemia secondary to chronic kidney disease. Fatigue has improved. No new complaints.     Past Medical History:  Diagnosis Date   (HFpEF) heart failure with preserved ejection fraction (HCC) 01/13/2014   a.) TTE 01/13/2014: EF 45%, apical HK, mod LVH, mild MAC, mild LAE/RVE, mod MR/TR/PR; b.) TTE 10/01/2018: EF 55-60%, mild MVH, mild LAE, triv MR/TR, G1DD   Allergy    ANA positive    Anemia of chronic renal failure, stage 3 (moderate), unspecified whether stage 3a or 3b CKD (HCC)    Aortic  atherosclerosis (HCC)    Asthma    Cataract    Chronic pain syndrome    a.) followed by [facility] Cherylann Ratel, MD)   Chronic, continuous use of opioids    CKD (chronic kidney disease), stage III (HCC)    Complication of anesthesia    a.) anesthesia awareness/premature emergence during colonoscopy   DDD (degenerative disc disease), lumbar    Dyspnea on exertion    Dysrhythmia    IRREGULAR HEART BEAT   Family history of adverse reaction to anesthesia    sister difficult to put to sleep   GERD (gastroesophageal reflux disease)    Glaucoma    Gout    H/O tooth extraction    all lower teeth 1/19   Hemorrhoid    History of hiatal hernia    History of kidney stones    Hypertension    Hypothyroidism    Implantable loop recorder present 08/22/2021   a.) s/p implantation of [employer] LINQ Reveal ILR; serial #: [id number]   Long term current use of immunosuppressive drug    a.) MTX + apremilast   Migraines    Mixed hyperlipidemia    Multiple gastric ulcers    Osteoarthritis    Psoriasis    Psoriatic arthritis (HCC)    a.) on apremilast   Rheumatoid arthritis (HCC)    a.) on MTX   SS-A antibody positive    Wears dentures    partial upper and lower    Past Surgical History:  Procedure Laterality Date   ANKLE SURGERY Right    CARPAL TUNNEL RELEASE     x3   COLONOSCOPY  2000?   COLONOSCOPY WITH PROPOFOL N/A 10/22/2017   Procedure: COLONOSCOPY WITH PROPOFOL;  Surgeon: Midge Minium, MD;  Location: [facility];  Service: Endoscopy;  Laterality: N/A;   COLONOSCOPY WITH PROPOFOL N/A 08/14/2022   Procedure: COLONOSCOPY WITH PROPOFOL;  Surgeon: Midge Minium, MD;  Location: [facility] ENDOSCOPY;  Service: Endoscopy;  Laterality: N/A;   ESOPHAGOGASTRODUODENOSCOPY (EGD) WITH PROPOFOL N/A 10/22/2017   Procedure: ESOPHAGOGASTRODUODENOSCOPY (EGD) WITH PROPOFOL;  Surgeon: Midge Minium, MD;  Location: [facility];  Service: Endoscopy;  Laterality: N/A;    ESOPHAGOGASTRODUODENOSCOPY (EGD) WITH PROPOFOL N/A 08/14/2022   Procedure: ESOPHAGOGASTRODUODENOSCOPY (EGD) WITH PROPOFOL;  Surgeon: Midge Minium, MD;  Location: [facility] ENDOSCOPY;  Service: Endoscopy;  Laterality: N/A;   FUSION OF TALONAVICULAR JOINT Right 08/03/2015   Procedure: TAILOR NAVICULAR JOINT FUSION - RIGHT ;  Surgeon: Gwyneth Revels, DPM;  Location: [facility] ORS;  Service: Podiatry;  Laterality: Right;   HOT HEMOSTASIS  08/14/2022   Procedure: HOT HEMOSTASIS (ARGON PLASMA COAGULATION/BICAP);  Surgeon: Midge Minium, MD;  Location: [facility] ENDOSCOPY;  Service: Endoscopy;;   JOINT REPLACEMENT Right    knee x 3 ,right x1 and left x2   LOOP RECORDER IMPLANT  08/22/2021   Procedure: LOOP RECORDER IMPLANT; Location:  [facility]; Surgeon: Sherryl Manges, MD   POLYPECTOMY N/A 10/22/2017   Procedure: POLYPECTOMY;  Surgeon: Midge Minium, MD;  Location: [facility];  Service: Endoscopy;  Laterality: N/A;   POLYPECTOMY  08/14/2022   Procedure: POLYPECTOMY;  Surgeon: Midge Minium, MD;  Location: [facility] ENDOSCOPY;  Service: Endoscopy;;   ROTATOR CUFF REPAIR Right 1995   TONSILLECTOMY     TOTAL KNEE REVISION Right 01/20/2018   Procedure: POLYETHYLENE EXCHANGE;  Surgeon: Donato Heinz, MD;  Location: [facility] ORS;  Service: Orthopedics;  Laterality: Right;   TOTAL KNEE REVISION Left 10/09/2021   Procedure: LEFT TOTAL KNEE REVISION WITH POLYETHYLENE EXCHANGE.;  Surgeon: Donato Heinz, MD;  Location: [facility] ORS;  Service: Orthopedics;  Laterality: Left;   TUBAL LIGATION     UPPER GI ENDOSCOPY  2000?    Family History  Problem Relation Age of Onset   Heart failure Mother    Gout Mother    Arthritis Mother    Hypertension Mother    Stroke Mother    Heart failure Father    Diabetes Father    Hyperlipidemia Father    COPD Sister    Cancer Maternal Aunt        breast   Cancer Maternal Uncle        kidney   Breast cancer Neg Hx     Social History:  reports that she quit smoking about 30 years ago. Her  smoking use included cigarettes. She started smoking about 50 years ago. She has a 40 pack-year smoking history. She has never used smokeless tobacco. She reports that she does not drink alcohol and does not use drugs.  Allergies:  Allergies  Allergen Reactions   Ampicillin Swelling   Penicillins Anaphylaxis and Swelling    IgE = 10 (11/05/2017)  Has patient had a PCN reaction causing immediate rash, facial/tongue/throat swelling, SOB or lightheadedness with hypotension: Yes Has patient had a PCN reaction causing severe rash involving mucus membranes or skin necrosis: No Has patient had a PCN reaction that required hospitalization: No Has patient had a PCN reaction occurring within the last 10 years: Yes If all of the above answers are "NO", then may proceed with Cephalosporin use.   Vancomycin Rash    Other reaction(s): Other (see comments)   Vibramycin [Doxycycline Calcium] Rash    Current Medications: Current Outpatient Medications  Medication Sig Dispense Refill   acetaminophen (TYLENOL) 650 MG CR tablet Take 650 mg by mouth every 8 (eight) hours as needed for pain.     albuterol (VENTOLIN HFA) 108 (90 Base) MCG/ACT inhaler Inhale 2 puffs into the lungs every 6 (six) hours as needed for wheezing or shortness of breath. 8 g 2   allopurinol (ZYLOPRIM) 100 MG tablet Take 200 mg by mouth every morning.      betamethasone dipropionate 0.05 % cream APPLY TO PSORIASIS ON HANDS TWICE DAILY ONLY. AVOID FACE, GROIN AND AXILLA 45 g 0   calcipotriene (DOVONOX) 0.005 % cream APPLY TOPICALLY TO PSORIASIS AREAS ON LEGS AND FEET ONCE OR TWICE DAILY 540 g 1   cetirizine (ZYRTEC) 10 MG tablet Take 10 mg by mouth daily.     Cholecalciferol (VITAMIN D3) 2000 units TABS Take 2,000 Units by mouth daily.     clobetasol ointment (TEMOVATE) 0.05 % Apply topically 2 (two) times daily.     diclofenac sodium (VOLTAREN) 1 % GEL Apply 2 g topically 3 (three) times daily.      Dupilumab (DUPIXENT [place]) Inject into  the skin.  erythromycin ophthalmic ointment Place 1 Application into the left eye 3 (three) times daily. 3.5 g 0   FARXIGA 10 MG TABS tablet Take 10 mg by mouth every morning.     furosemide (LASIX) 40 MG tablet Take 40 mg by mouth daily.     gabapentin (NEURONTIN) 600 MG tablet Take 1 tablet (600 mg total) by mouth at bedtime. 30 tablet 5   HYDROcodone-acetaminophen (NORCO) 7.5-325 MG tablet Take 1 tablet by mouth every 8 (eight) hours as needed for severe pain (pain score 7-10). Must last 30 days. 90 tablet 0   JARDIANCE 10 MG TABS tablet Take 10 mg by mouth every morning.     ketoconazole (NIZORAL) 2 % cream Apply topically daily as needed. 15 g 2   levothyroxine (SYNTHROID) 137 MCG tablet Take 1 tablet (137 mcg total) by mouth daily before breakfast. 90 tablet 0   losartan (COZAAR) 25 MG tablet Take 25 mg by mouth daily.     meclizine (ANTIVERT) 12.5 MG tablet TAKE 1 TABLET(12.5 MG) BY MOUTH THREE TIMES DAILY AS NEEDED FOR DIZZINESS 30 tablet 1   metoprolol tartrate (LOPRESSOR) 25 MG tablet TAKE 1 TABLET(25 MG) BY MOUTH TWICE DAILY 180 tablet 3   montelukast (SINGULAIR) 10 MG tablet TAKE 1 TABLET(10 MG) BY MOUTH AT BEDTIME 90 tablet 1   Multiple Vitamins-Calcium (ONE-A-DAY WOMENS FORMULA PO) Take 1 tablet by mouth daily.     mupirocin ointment (BACTROBAN) 2 % Apply 1 application topically 2 (two) times daily. 22 g 0   nystatin (MYCOSTATIN) 100000 UNIT/ML suspension Take 5 mLs (500,000 Units total) by mouth 4 (four) times daily. 60 mL 0   nystatin cream (MYCOSTATIN) Apply 1 Application topically 2 (two) times daily. 30 g 0   omega-3 acid ethyl esters (LOVAZA) 1 g capsule Take 1 capsule (1 g total) by mouth 2 (two) times daily. 180 capsule 1   omeprazole (PRILOSEC) 40 MG capsule TAKE 1 CAPSULE(40 MG) BY MOUTH DAILY 90 capsule 1   Polyethyl Glycol-Propyl Glycol (SYSTANE OP) Place 1 drop into both eyes daily as needed (dry eyes).     potassium chloride SA (KLOR-CON M) 20 MEQ tablet TAKE 1  TABLET(20 MEQ) BY MOUTH DAILY 90 tablet 0   predniSONE (STERAPRED UNI-PAK 21 TAB) 10 MG (21) TBPK tablet Day 1 take 6 tablets, Day 2 take 5 tablets, Day 3 take 4 tablets, Day 4 take 3 tablets, Day 5 take 2 tablets D6 take 1 tablet 1 each 0   tiZANidine (ZANAFLEX) 4 MG tablet Take 1 tablet (4 mg total) by mouth at bedtime. 60 tablet 5   triamcinolone (KENALOG) 0.1 % Apply 1 application topically 2 (two) times daily. 30 g 0   Upadacitinib (RINVOQ PO) Take by mouth.     OTEZLA 30 MG TABS Take 1 tablet by mouth daily. (Patient not taking: Reported on 01/08/2023)     No current facility-administered medications for this visit.    Review of Systems  Constitutional:  Negative for chills, diaphoresis, fever, malaise/fatigue and weight loss.  HENT:  Negative for congestion, ear discharge, ear pain, hearing loss, nosebleeds, sinus pain, sore throat and tinnitus.   Eyes:        Glaucoma  Respiratory:  Negative for cough, hemoptysis, sputum production and shortness of breath (on exertion, due to asthma).   Cardiovascular:  Positive for leg swelling (right ankle). Negative for chest pain and palpitations.  Gastrointestinal:  Negative for abdominal pain, blood in stool, constipation, diarrhea, heartburn, melena, nausea and vomiting.  Genitourinary:  Negative for dysuria, frequency, hematuria and urgency.  Musculoskeletal:  Positive for joint pain (arthritis). Negative for back pain, myalgias and neck pain.       Status post knee replacement  Skin:  Positive for itching and rash.  Neurological:  Positive for sensory change (foot numbness s/p surgery). Negative for dizziness, tingling, weakness and headaches.  Endo/Heme/Allergies:  Does not bruise/bleed easily.  Psychiatric/Behavioral:  Negative for depression and memory loss. The patient is not nervous/anxious and does not have insomnia.   All other systems reviewed and are negative.   Performance status (ECOG): 1-2  Vitals Blood pressure 129/76,  pulse 77, temperature 97.7 F (36.5 C), temperature source Tympanic, resp. rate 18, weight 261 lb 1.6 oz (118.4 kg), SpO2 99%.   Physical Exam Vitals and nursing note reviewed.  Constitutional:      General: She is not in acute distress.    Appearance: She is not diaphoretic.  Eyes:     General: No scleral icterus. Abdominal:     Palpations: Abdomen is soft.  Skin:    General: Skin is warm.  Neurological:     Mental Status: She is alert and oriented to person, place, and time.  Psychiatric:        Behavior: Behavior normal.    Labs    Latest Ref Rng & Units 03/25/2023    2:28 PM 03/14/2023   10:41 PM 02/26/2023    3:17 PM  CBC  WBC 4.0 - 10.5 K/uL 4.5  5.3    Hemoglobin 12.0 - 15.0 g/dL 16.1  09.6  04.5   Hematocrit 36.0 - 46.0 % 31.1  32.9  32.8   Platelets 150 - 400 K/uL 170  187        Latest Ref Rng & Units 03/14/2023   10:41 PM 10/26/2022    2:39 AM 09/25/2022    4:19 PM  CMP  Glucose 70 - 99 mg/dL 87  409  84   BUN 8 - 23 mg/dL 43  30  21   Creatinine 0.44 - 1.00 mg/dL 8.11  9.14  7.82   Sodium 135 - 145 mmol/L 140  140  144   Potassium 3.5 - 5.1 mmol/L 3.7  3.5  4.1   Chloride 98 - 111 mmol/L 103  105  104   CO2 22 - 32 mmol/L 26  26  25    Calcium 8.9 - 10.3 mg/dL 9.9  95.6  21.3

## 2023-03-26 ENCOUNTER — Telehealth: Payer: Self-pay

## 2023-03-26 NOTE — Telephone Encounter (Signed)
Michele Moore can you please schedule and notify patient of appointment dates and times.

## 2023-03-26 NOTE — Telephone Encounter (Signed)
-----   Message from Rickard Patience sent at 03/25/2023  8:46 PM EST ----- Please arrange patient to get IV Venofer weekly x 2.  Thank you

## 2023-03-26 NOTE — Telephone Encounter (Signed)
Please contact pt to set up IV venofer weekly x2.

## 2023-03-28 ENCOUNTER — Encounter: Payer: Self-pay | Admitting: Oncology

## 2023-03-30 ENCOUNTER — Inpatient Hospital Stay: Payer: 59 | Attending: Oncology

## 2023-03-30 VITALS — BP 139/77 | HR 78 | Temp 97.5°F | Resp 18

## 2023-03-30 DIAGNOSIS — N183 Chronic kidney disease, stage 3 unspecified: Secondary | ICD-10-CM | POA: Diagnosis not present

## 2023-03-30 DIAGNOSIS — D631 Anemia in chronic kidney disease: Secondary | ICD-10-CM | POA: Insufficient documentation

## 2023-03-30 DIAGNOSIS — Z79899 Other long term (current) drug therapy: Secondary | ICD-10-CM | POA: Diagnosis not present

## 2023-03-30 DIAGNOSIS — D649 Anemia, unspecified: Secondary | ICD-10-CM

## 2023-03-30 DIAGNOSIS — I130 Hypertensive heart and chronic kidney disease with heart failure and stage 1 through stage 4 chronic kidney disease, or unspecified chronic kidney disease: Secondary | ICD-10-CM | POA: Insufficient documentation

## 2023-03-30 MED ORDER — IRON SUCROSE 20 MG/ML IV SOLN
200.0000 mg | Freq: Once | INTRAVENOUS | Status: AC
  Administered 2023-03-30: 200 mg via INTRAVENOUS

## 2023-03-30 NOTE — Patient Instructions (Signed)

## 2023-04-01 ENCOUNTER — Ambulatory Visit (INDEPENDENT_AMBULATORY_CARE_PROVIDER_SITE_OTHER): Payer: 59

## 2023-04-01 DIAGNOSIS — Z0000 Encounter for general adult medical examination without abnormal findings: Secondary | ICD-10-CM | POA: Diagnosis not present

## 2023-04-01 NOTE — Patient Instructions (Addendum)
 Michele Moore , Thank you for taking time to come for your Medicare Wellness Visit. I appreciate your ongoing commitment to your health goals. Please review the following plan we discussed and let me know if I can assist you in the future.   Referrals/Orders/Follow-Ups/Clinician Recommendations: NONE  This is a list of the screening recommended for you and due dates:  Health Maintenance  Topic Date Due   Hepatitis C Screening  Never done   DTaP/Tdap/Td vaccine (1 - Tdap) Never done   Zoster (Shingles) Vaccine (1 of 2) 10[birth year]/1967   COVID-19 Vaccine (3 - Pfizer risk series) 07/15/2019   Mammogram  04/05/2020   Medicare Annual Wellness Visit  03/31/2024   Colon Cancer Screening  08/14/2027   Pneumonia Vaccine  Completed   Flu Shot  Completed   DEXA scan (bone density measurement)  Completed   HPV Vaccine  Aged Out    Advanced directives: (ACP Link)Information on Advanced Care Planning can be found at [facility] Advance Health Care Directives Advance Health Care Directives ([email or web address]   Next Medicare Annual Wellness Visit scheduled for next year: Yes   04/06/24 @ 2:00 PM BY PHONE

## 2023-04-01 NOTE — Progress Notes (Signed)
 Subjective:   Michele Moore is a 73 y.o. female who presents for Medicare Annual (Subsequent) preventive examination.  Visit Complete: Virtual I connected with  Michele Moore on 04/01/23 by a audio enabled telemedicine application and verified that I am speaking with the correct person using two identifiers.  This patient declined Interactive audio and acupuncturist. Therefore the visit was completed with audio only.   Patient Location: Home  Provider Location: Office/Clinic  I discussed the limitations of evaluation and management by telemedicine. The patient expressed understanding and agreed to proceed.  Vital Signs: Because this visit was a virtual/telehealth visit, some criteria may be missing or patient reported. Any vitals not documented were not able to be obtained and vitals that have been documented are patient reported.        Objective:    Today's Vitals   04/01/23 1520  PainSc: 3    There is no height or weight on file to calculate BMI.     04/01/2023    3:25 PM 03/30/2023    3:38 PM 03/14/2023    9:47 PM 01/08/2023    2:57 PM 10/26/2022    2:40 AM 10/14/2022    2:39 PM 09/11/2022    1:00 PM  Advanced Directives  Does Patient Have a Medical Advance Directive? No No No No No No No  Would patient like information on creating a medical advance directive? No - Patient declined No - Patient declined No - Patient declined No - Patient declined  No - Patient declined No - Patient declined    Current Medications (verified) Outpatient Encounter Medications as of 04/01/2023  Medication Sig   acetaminophen  (TYLENOL ) 650 MG CR tablet Take 650 mg by mouth every 8 (eight) hours as needed for pain.   albuterol  (VENTOLIN  HFA) 108 (90 Base) MCG/ACT inhaler Inhale 2 puffs into the lungs every 6 (six) hours as needed for wheezing or shortness of breath.   allopurinol  (ZYLOPRIM ) 100 MG tablet Take 200 mg by mouth every morning.    betamethasone  dipropionate 0.05 % cream APPLY  TO PSORIASIS ON HANDS TWICE DAILY ONLY. AVOID FACE, GROIN AND AXILLA   calcipotriene  (DOVONOX) 0.005 % cream APPLY TOPICALLY TO PSORIASIS AREAS ON LEGS AND FEET ONCE OR TWICE DAILY   cetirizine (ZYRTEC) 10 MG tablet Take 10 mg by mouth daily.   Cholecalciferol  (VITAMIN D3) 2000 units TABS Take 2,000 Units by mouth daily.   clobetasol  ointment (TEMOVATE ) 0.05 % Apply topically 2 (two) times daily.   diclofenac sodium (VOLTAREN) 1 % GEL Apply 2 g topically 3 (three) times daily.    Dupilumab (DUPIXENT [place]) Inject into the skin.   erythromycin  ophthalmic ointment Place 1 Application into the left eye 3 (three) times daily.   FARXIGA  10 MG TABS tablet Take 10 mg by mouth every morning.   furosemide  (LASIX ) 40 MG tablet Take 40 mg by mouth daily.   gabapentin  (NEURONTIN ) 600 MG tablet Take 1 tablet (600 mg total) by mouth at bedtime.   HYDROcodone -acetaminophen  (NORCO) 7.5-325 MG tablet Take 1 tablet by mouth every 8 (eight) hours as needed for severe pain (pain score 7-10). Must last 30 days.   JARDIANCE  10 MG TABS tablet Take 10 mg by mouth every morning.   ketoconazole  (NIZORAL ) 2 % cream Apply topically daily as needed.   levothyroxine  (SYNTHROID ) 137 MCG tablet Take 1 tablet (137 mcg total) by mouth daily before breakfast.   losartan  (COZAAR ) 25 MG tablet Take 25 mg by mouth daily.  meclizine  (ANTIVERT ) 12.5 MG tablet TAKE 1 TABLET(12.5 MG) BY MOUTH THREE TIMES DAILY AS NEEDED FOR DIZZINESS   metoprolol  tartrate (LOPRESSOR ) 25 MG tablet TAKE 1 TABLET(25 MG) BY MOUTH TWICE DAILY   montelukast  (SINGULAIR ) 10 MG tablet TAKE 1 TABLET(10 MG) BY MOUTH AT BEDTIME   Multiple Vitamins-Calcium  (ONE-A-DAY WOMENS FORMULA PO) Take 1 tablet by mouth daily.   mupirocin  ointment (BACTROBAN ) 2 % Apply 1 application topically 2 (two) times daily.   nystatin  (MYCOSTATIN ) 100000 UNIT/ML suspension Take 5 mLs (500,000 Units total) by mouth 4 (four) times daily.   nystatin  cream (MYCOSTATIN ) Apply 1 Application  topically 2 (two) times daily.   omega-3 acid ethyl esters (LOVAZA ) 1 g capsule Take 1 capsule (1 g total) by mouth 2 (two) times daily.   omeprazole  (PRILOSEC) 40 MG capsule TAKE 1 CAPSULE(40 MG) BY MOUTH DAILY   OTEZLA  30 MG TABS Take 1 tablet by mouth daily.   Polyethyl Glycol-Propyl Glycol (SYSTANE OP) Place 1 drop into both eyes daily as needed (dry eyes).   potassium chloride  SA (KLOR-CON  M) 20 MEQ tablet TAKE 1 TABLET(20 MEQ) BY MOUTH DAILY   predniSONE  (STERAPRED UNI-PAK 21 TAB) 10 MG (21) TBPK tablet Day 1 take 6 tablets, Day 2 take 5 tablets, Day 3 take 4 tablets, Day 4 take 3 tablets, Day 5 take 2 tablets D6 take 1 tablet   tiZANidine  (ZANAFLEX ) 4 MG tablet Take 1 tablet (4 mg total) by mouth at bedtime.   triamcinolone  (KENALOG ) 0.1 % Apply 1 application topically 2 (two) times daily.   Upadacitinib (RINVOQ PO) Take by mouth.   [DISCONTINUED] fluticasone  (FLONASE ) 50 MCG/ACT nasal spray SHAKE LIQUID AND USE 1 SPRAY IN EACH NOSTRIL DAILY   [DISCONTINUED] mometasone  (NASONEX ) 50 MCG/ACT nasal spray Place 2 sprays into the nose daily.   No facility-administered encounter medications on file as of 04/01/2023.    Allergies (verified) Ampicillin, Penicillins, Vancomycin, and Vibramycin  [doxycycline  calcium ]   History: Past Medical History:  Diagnosis Date   (HFpEF) heart failure with preserved ejection fraction (HCC) 01/13/2014   a.) TTE 01/13/2014: EF 45%, apical HK, mod LVH, mild MAC, mild LAE/RVE, mod MR/TR/PR; b.) TTE 10/01/2018: EF 55-60%, mild MVH, mild LAE, triv MR/TR, G1DD   Allergy    ANA positive    Anemia of chronic renal failure, stage 3 (moderate), unspecified whether stage 3a or 3b CKD (HCC)    Aortic atherosclerosis (HCC)    Asthma    Cataract    Chronic pain syndrome    a.) followed by [facility] Geoffry, MD)   Chronic, continuous use of opioids    CKD (chronic kidney disease), stage III (HCC)    Complication of anesthesia    a.) anesthesia  awareness/premature emergence during colonoscopy   DDD (degenerative disc disease), lumbar    Dyspnea on exertion    Dysrhythmia    IRREGULAR HEART BEAT   Family history of adverse reaction to anesthesia    sister difficult to put to sleep   GERD (gastroesophageal reflux disease)    Glaucoma    Gout    H/O tooth extraction    all lower teeth 1/19   Hemorrhoid    History of hiatal hernia    History of kidney stones    Hypertension    Hypothyroidism    Implantable loop recorder present 08/22/2021   a.) s/p implantation of [employer] LINQ Reveal ILR; serial #: [id number]   Long term current use of immunosuppressive drug    a.) MTX +  apremilast    Migraines    Mixed hyperlipidemia    Multiple gastric ulcers    Osteoarthritis    Psoriasis    Psoriatic arthritis (HCC)    a.) on apremilast    Rheumatoid arthritis (HCC)    a.) on MTX   SS-A antibody positive    Wears dentures    partial upper and lower   Past Surgical History:  Procedure Laterality Date   ANKLE SURGERY Right    CARPAL TUNNEL RELEASE     x3   COLONOSCOPY  2000?   COLONOSCOPY WITH PROPOFOL  N/A 10/22/2017   Procedure: COLONOSCOPY WITH PROPOFOL ;  Surgeon: Jinny Carmine, MD;  Location: [facility];  Service: Endoscopy;  Laterality: N/A;   COLONOSCOPY WITH PROPOFOL  N/A 08/14/2022   Procedure: COLONOSCOPY WITH PROPOFOL ;  Surgeon: Jinny Carmine, MD;  Location: [facility];  Service: Endoscopy;  Laterality: N/A;   ESOPHAGOGASTRODUODENOSCOPY (EGD) WITH PROPOFOL  N/A 10/22/2017   Procedure: ESOPHAGOGASTRODUODENOSCOPY (EGD) WITH PROPOFOL ;  Surgeon: Jinny Carmine, MD;  Location: [facility];  Service: Endoscopy;  Laterality: N/A;   ESOPHAGOGASTRODUODENOSCOPY (EGD) WITH PROPOFOL  N/A 08/14/2022   Procedure: ESOPHAGOGASTRODUODENOSCOPY (EGD) WITH PROPOFOL ;  Surgeon: Jinny Carmine, MD;  Location: [facility];  Service: Endoscopy;  Laterality: N/A;   FUSION OF TALONAVICULAR JOINT Right 08/03/2015   Procedure: TAILOR  NAVICULAR JOINT FUSION - RIGHT ;  Surgeon: Eva Gay, DPM;  Location: [facility];  Service: Podiatry;  Laterality: Right;   HOT HEMOSTASIS  08/14/2022   Procedure: HOT HEMOSTASIS (ARGON PLASMA COAGULATION/BICAP);  Surgeon: Jinny Carmine, MD;  Location: [facility];  Service: Endoscopy;;   JOINT REPLACEMENT Right    knee x 3 ,right x1 and left x2   LOOP RECORDER IMPLANT  08/22/2021   Procedure: LOOP RECORDER IMPLANT; Location: [facility]; Surgeon: Elspeth Sage, MD   POLYPECTOMY N/A 10/22/2017   Procedure: POLYPECTOMY;  Surgeon: Jinny Carmine, MD;  Location: [facility];  Service: Endoscopy;  Laterality: N/A;   POLYPECTOMY  08/14/2022   Procedure: POLYPECTOMY;  Surgeon: Jinny Carmine, MD;  Location: [facility];  Service: Endoscopy;;   ROTATOR CUFF REPAIR Right 1995   TONSILLECTOMY     TOTAL KNEE REVISION Right 01/20/2018   Procedure: POLYETHYLENE EXCHANGE;  Surgeon: Mardee Lynwood SQUIBB, MD;  Location: [facility];  Service: Orthopedics;  Laterality: Right;   TOTAL KNEE REVISION Left 10/09/2021   Procedure: LEFT TOTAL KNEE REVISION WITH POLYETHYLENE EXCHANGE.;  Surgeon: Mardee Lynwood SQUIBB, MD;  Location: [facility];  Service: Orthopedics;  Laterality: Left;   TUBAL LIGATION     UPPER GI ENDOSCOPY  2000?   Family History  Problem Relation Age of Onset   Heart failure Mother    Gout Mother    Arthritis Mother    Hypertension Mother    Stroke Mother    Heart failure Father    Diabetes Father    Hyperlipidemia Father    COPD Sister    Cancer Maternal Aunt        breast   Cancer Maternal Uncle        kidney   Breast cancer Neg Hx    Social History   Socioeconomic History   Marital status: Divorced    Spouse name: Not on file   Number of children: 4   Years of education: Not on file   Highest education level: 9th grade  Occupational History   Occupation: retired  Tobacco Use   Smoking status: Former    Current packs/day: 0.00    Average packs/day: 2.0 packs/day for 20.0  years (40.0  ttl pk-yrs)    Types: Cigarettes    Start date: 02/24/1973    Quit date: 02/24/1993    Years since quitting: 30.1   Smokeless tobacco: Never  Vaping Use   Vaping status: Never Used  Substance and Sexual Activity   Alcohol  use: No    Alcohol /week: 0.0 standard drinks of alcohol    Drug use: No   Sexual activity: Not Currently  Other Topics Concern   Not on file  Social History Narrative   Pt lives alone   Social Drivers of Health   Financial Resource Strain: Low Risk  (04/01/2023)   Overall Financial Resource Strain (CARDIA)    Difficulty of Paying Living Expenses: Not hard at all  Food Insecurity: No Food Insecurity (04/01/2023)   Hunger Vital Sign    Worried About Running Out of Food in the Last Year: Never true    Ran Out of Food in the Last Year: Never true  Transportation Needs: No Transportation Needs (04/01/2023)   PRAPARE - Administrator, Civil Service (Medical): No    Lack of Transportation (Non-Medical): No  Physical Activity: Inactive (04/01/2023)   Exercise Vital Sign    Days of Exercise per Week: 0 days    Minutes of Exercise per Session: 0 min  Stress: No Stress Concern Present (04/01/2023)   [employer] of Occupational Health - Occupational Stress Questionnaire    Feeling of Stress : Only a little  Social Connections: Moderately Integrated (04/01/2023)   Social Connection and Isolation Panel [NHANES]    Frequency of Communication with Friends and Family: More than three times a week    Frequency of Social Gatherings with Friends and Family: Twice a week    Attends Religious Services: More than 4 times per year    Active Member of [employer] or Organizations: Yes    Attends Engineer, Structural: More than 4 times per year    Marital Status: Divorced    Tobacco Counseling Counseling given: Not Answered   Clinical Intake:  Pre-visit preparation completed: Yes  Pain : 0-10 Pain Score: 3  Pain Type: Acute pain Pain Location: Throat Pain  Radiating Towards: blisters in mouth Pain Onset: In the past 7 days Pain Frequency: Constant     BMI - recorded: 36.4 Nutritional Status: BMI > 30  Obese Nutritional Risks: None Diabetes: No  How often do you need to have someone help you when you read instructions, pamphlets, or other written materials from your doctor or pharmacy?: 1 - Never  Interpreter Needed?: No  Information entered by :: JHONNIE DAS, LPN   Activities of Daily Living     No data to display          Patient Care Team: Joshua Cathryne BROCKS, MD as PCP - General (Family Medicine) Marcelino Gales, MD (Nephrology) Marcelino Nurse, MD as Consulting Physician (Pain Medicine) Hooten, Lynwood SQUIBB, MD (Orthopedic Surgery) Babara Call, MD as Consulting Physician (Oncology) Tobie Lady POUR, MD as Consulting Physician (Rheumatology) Ashley Soulier, DPM as Referring Physician (Podiatry)  Indicate any recent Medical Services you may have received from other than [facility] providers in the past year (date may be approximate).     Assessment:   This is a routine wellness examination for Christabel.  Hearing/Vision screen Hearing Screening - Comments:: NO AIDS Vision Screening - Comments:: NO GLASSES- DR.TRUMAN   Goals Addressed             This Visit's Progress    DIET -  EAT MORE FRUITS AND VEGETABLES         Depression Screen    04/01/2023    3:24 PM 01/08/2023    3:03 PM 10/14/2022    2:39 PM 09/25/2022    3:17 PM 07/17/2022    2:28 PM 03/27/2022    3:33 PM 03/24/2022    2:45 PM  PHQ 2/9 Scores  PHQ - 2 Score 0 0 0 0 0 0 0  PHQ- 9 Score 0   0  0     Fall Risk    04/01/2023    3:26 PM 01/08/2023    3:03 PM 10/14/2022    2:39 PM 09/25/2022    3:16 PM 07/17/2022    2:27 PM  Fall Risk   Falls in the past year? 0 0 0 0 1  Number falls in past yr: 0   0 1  Injury with Fall? 0   0 0  Risk for fall due to : No Fall Risks   No Fall Risks   Follow up Falls prevention discussed;Falls evaluation completed   Falls evaluation  completed     MEDICARE RISK AT HOME:    TIMED UP AND GO:  Was the test performed?  No    Cognitive Function:        04/01/2023    3:26 PM 03/24/2022    2:46 PM 03/09/2019    1:38 PM 03/01/2018    4:22 PM  6CIT Screen  What Year? 0 points 0 points 0 points 0 points  What month? 0 points 0 points 0 points 0 points  What time? 0 points 0 points 0 points 0 points  Count back from 20 0 points 0 points 0 points 0 points  Months in reverse 0 points 0 points 0 points 0 points  Repeat phrase 0 points 0 points 0 points 2 points  Total Score 0 points 0 points 0 points 2 points    Immunizations Immunization History  Administered Date(s) Administered   Fluad Quad(high Dose 65+) 10/19/2018, 12/08/2019, 03/19/2021   Fluad Trivalent(High Dose 65+) 12/03/2022   Influenza, High Dose Seasonal PF 12/23/2016   Influenza-Unspecified 11/07/2014, 12/23/2016   PFIZER(Purple Top)SARS-COV-2 Vaccination 05/20/2019, 06/17/2019   PNEUMOCOCCAL CONJUGATE-20 09/16/2021   Pneumococcal Conjugate-13 11/07/2014   Pneumococcal Polysaccharide-23 12/23/2016   Zoster, Live 11/07/2014    TDAP status: Due, Education has been provided regarding the importance of this vaccine. Advised may receive this vaccine at local pharmacy or Health Dept. Aware to provide a copy of the vaccination record if obtained from local pharmacy or Health Dept. Verbalized acceptance and understanding.  Flu Vaccine status: Up to date  Pneumococcal vaccine status: Up to date  Covid-19 vaccine status: Completed vaccines  Qualifies for Shingles Vaccine? Yes   Zostavax completed Yes   Shingrix  Completed?: No.    Education has been provided regarding the importance of this vaccine. Patient has been advised to call insurance company to determine out of pocket expense if they have not yet received this vaccine. Advised may also receive vaccine at local pharmacy or Health Dept. Verbalized acceptance and understanding.  Screening Tests Health  Maintenance  Topic Date Due   Hepatitis C Screening  Never done   DTaP/Tdap/Td (1 - Tdap) Never done   Zoster Vaccines- Shingrix  (1 of 2) 10[birth year]05/1967   COVID-19 Vaccine (3 - Pfizer risk series) 07/15/2019   MAMMOGRAM  04/05/2020   Medicare Annual Wellness (AWV)  03/31/2024   Colonoscopy  08/14/2027   Pneumonia Vaccine 65+ Years  old  Completed   INFLUENZA VACCINE  Completed   DEXA SCAN  Completed   HPV VACCINES  Aged Out    Health Maintenance  Health Maintenance Due  Topic Date Due   Hepatitis C Screening  Never done   DTaP/Tdap/Td (1 - Tdap) Never done   Zoster Vaccines- Shingrix  (1 of 2) 10/05/1967   COVID-19 Vaccine (3 - Pfizer risk series) 07/15/2019   MAMMOGRAM  04/05/2020    Colorectal cancer screening: Type of screening: Colonoscopy. Completed 08/14/22. Repeat every 5 years  DECLINED REFERRAL FOR MAMMOGRAM  Bone Density status: Completed 04/06/19. Results reflect: Bone density results: NORMAL. Repeat every 5 years.  Lung Cancer Screening: (Low Dose CT Chest recommended if Age 8-80 years, 20 pack-year currently smoking OR have quit w/in 15years.) does not qualify.    Additional Screening:  Hepatitis C Screening: does qualify; Completed NO  Vision Screening: Recommended annual ophthalmology exams for early detection of glaucoma and other disorders of the eye. Is the patient up to date with their annual eye exam?  Yes  Who is the provider or what is the name of the office in which the patient attends annual eye exams? DR.JOHNSON If pt is not established with a provider, would they like to be referred to a provider to establish care? No .   Dental Screening: Recommended annual dental exams for proper oral hygiene    Community Resource Referral / Chronic Care Management: CRR required this visit?  No   CCM required this visit?  No     Plan:     I have personally reviewed and noted the following in the patient's chart:   Medical and social history Use of  alcohol , tobacco or illicit drugs  Current medications and supplements including opioid prescriptions. Patient is not currently taking opioid prescriptions. Functional ability and status Nutritional status Physical activity Advanced directives List of other physicians Hospitalizations, surgeries, and ER visits in previous 12 months Vitals Screenings to include cognitive, depression, and falls Referrals and appointments  In addition, I have reviewed and discussed with patient certain preventive protocols, quality metrics, and best practice recommendations. A written personalized care plan for preventive services as well as general preventive health recommendations were provided to patient.     Jhonnie GORMAN Das, LPN   08/27/7972   After Visit Summary: (MyChart) Due to this being a telephonic visit, the after visit summary with patients personalized plan was offered to patient via MyChart   Nurse Notes: NONE

## 2023-04-02 ENCOUNTER — Ambulatory Visit (INDEPENDENT_AMBULATORY_CARE_PROVIDER_SITE_OTHER): Payer: 59 | Admitting: Family Medicine

## 2023-04-02 ENCOUNTER — Encounter: Payer: Self-pay | Admitting: Family Medicine

## 2023-04-02 VITALS — BP 128/74 | HR 61 | Temp 98.6°F | Ht 71.0 in | Wt 259.0 lb

## 2023-04-02 DIAGNOSIS — I10 Essential (primary) hypertension: Secondary | ICD-10-CM | POA: Diagnosis not present

## 2023-04-02 DIAGNOSIS — E876 Hypokalemia: Secondary | ICD-10-CM

## 2023-04-02 DIAGNOSIS — B379 Candidiasis, unspecified: Secondary | ICD-10-CM | POA: Diagnosis not present

## 2023-04-02 DIAGNOSIS — E782 Mixed hyperlipidemia: Secondary | ICD-10-CM

## 2023-04-02 DIAGNOSIS — K219 Gastro-esophageal reflux disease without esophagitis: Secondary | ICD-10-CM | POA: Diagnosis not present

## 2023-04-02 DIAGNOSIS — E039 Hypothyroidism, unspecified: Secondary | ICD-10-CM | POA: Diagnosis not present

## 2023-04-02 MED ORDER — METOPROLOL TARTRATE 25 MG PO TABS: 25.0000 mg | ORAL_TABLET | Freq: Two times a day (BID) | ORAL | 1 refills | Status: DC

## 2023-04-02 MED ORDER — LEVOTHYROXINE SODIUM 137 MCG PO TABS: 137.0000 ug | ORAL_TABLET | Freq: Every day | ORAL | 1 refills | Status: DC

## 2023-04-02 MED ORDER — NYSTATIN 100000 UNIT/ML MT SUSP: 5.0000 mL | Freq: Four times a day (QID) | OROMUCOSAL | 0 refills | Status: DC

## 2023-04-02 MED ORDER — OMEGA-3-ACID ETHYL ESTERS 1 G PO CAPS: 1.0000 | ORAL_CAPSULE | Freq: Two times a day (BID) | ORAL | 1 refills | Status: DC

## 2023-04-02 MED ORDER — MONTELUKAST SODIUM 10 MG PO TABS: ORAL_TABLET | ORAL | 1 refills | Status: DC

## 2023-04-02 MED ORDER — POTASSIUM CHLORIDE CRYS ER 20 MEQ PO TBCR
20.0000 meq | EXTENDED_RELEASE_TABLET | Freq: Every day | ORAL | 1 refills | Status: DC
Start: 2023-04-02 — End: 2023-12-18

## 2023-04-02 MED ORDER — OMEPRAZOLE 40 MG PO CPDR: DELAYED_RELEASE_CAPSULE | ORAL | 1 refills | Status: DC

## 2023-04-02 NOTE — Progress Notes (Signed)
 Date:  04/02/2023   Name:  Michele Moore   DOB:  Sep 29, 1950   MRN:  [id number]   Chief Complaint: Gastroesophageal Reflux, Hypothyroidism, Hypertension, Hyperlipidemia, and Cough (Cough, Sore throat, white on tongue, tongue pain. X2-3 weeks. No fever.)  Gastroesophageal Reflux She complains of coughing. She reports no abdominal pain, no belching, no chest pain, no choking, no dysphagia, no early satiety, no globus sensation, no heartburn, no hoarse voice, no nausea, no sore throat, no stridor or no wheezing. This is a chronic problem. The current episode started more than 1 year ago. The problem has been gradually improving. The symptoms are aggravated by certain foods. Pertinent negatives include no weight loss. She has tried a PPI for the symptoms. The treatment provided moderate relief.  Hypertension The current episode started more than 1 year ago. The problem has been waxing and waning since onset. The problem is controlled. Pertinent negatives include no anxiety, blurred vision, chest pain, headaches, malaise/fatigue, neck pain, orthopnea, palpitations, peripheral edema, PND, shortness of breath or sweats. There are no associated agents to hypertension. Risk factors for coronary artery disease include dyslipidemia. Past treatments include beta blockers. The current treatment provides moderate improvement. There are no compliance problems.  There is no history of CAD/MI or CVA. There is no history of chronic renal disease, a hypertension causing med or renovascular disease.  Hyperlipidemia This is a chronic problem. The current episode started more than 1 year ago. The problem is controlled. Recent lipid tests were reviewed and are normal. She has no history of chronic renal disease. Pertinent negatives include no chest pain, myalgias or shortness of breath. Treatments tried: omega 3. The current treatment provides moderate improvement of lipids. There are no compliance problems.   Cough This is  a new problem. The current episode started 1 to 4 weeks ago (2 weeks). The problem has been waxing and waning. Pertinent negatives include no chest pain, chills, ear congestion, ear pain, fever, headaches, heartburn, hemoptysis, myalgias, nasal congestion, postnasal drip, rash, rhinorrhea, sore throat, shortness of breath, sweats, weight loss or wheezing. Nothing aggravates the symptoms. The treatment provided moderate relief.    Lab Results  Component Value Date   NA 140 03/14/2023   K 3.7 03/14/2023   CO2 26 03/14/2023   GLUCOSE 87 03/14/2023   BUN 43 (H) 03/14/2023   CREATININE 2.15 (H) 03/14/2023   CALCIUM  9.9 03/14/2023   EGFR 33 (L) 09/25/2022   GFRNONAA 24 (L) 03/14/2023   Lab Results  Component Value Date   CHOL 167 07/01/2022   HDL 47 07/01/2022   LDLCALC 96 07/01/2022   TRIG 135 07/01/2022   CHOLHDL 4.2 10/01/2018   Lab Results  Component Value Date   TSH 0.232 (L) 10/26/2022   Lab Results  Component Value Date   HGBA1C 5.3 11/25/2021   Lab Results  Component Value Date   WBC 4.5 03/25/2023   HGB 10.0 (L) 03/25/2023   HCT 31.1 (L) 03/25/2023   MCV 89.4 03/25/2023   PLT 170 03/25/2023   Lab Results  Component Value Date   ALT 13 07/01/2022   AST 21 07/01/2022   ALKPHOS 115 07/01/2022   BILITOT <0.2 07/01/2022   No results found for: 25OHVITD2, 25OHVITD3, VD25OH   Review of Systems  Constitutional:  Negative for chills, fever, malaise/fatigue and weight loss.  HENT:  Negative for ear pain, hoarse voice, mouth sores, postnasal drip, rhinorrhea, sinus pressure, sneezing, sore throat and trouble swallowing.   Eyes:  Negative  for blurred vision.  Respiratory:  Positive for cough. Negative for hemoptysis, choking, shortness of breath and wheezing.   Cardiovascular:  Negative for chest pain, palpitations, orthopnea and PND.  Gastrointestinal:  Negative for abdominal pain, dysphagia, heartburn and nausea.  Musculoskeletal:  Negative for myalgias and neck  pain.  Skin:  Negative for rash.  Neurological:  Negative for headaches.    Patient Active Problem List   Diagnosis Date Noted   Need for prophylactic vaccination and inoculation against influenza 12/03/2022   Angiodysplasia of stomach and duodenum with hemorrhage 08/14/2022   Skin rash 08/04/2022   Thrush 08/04/2022   Symptomatic anemia 12/18/2021   Rheumatoid arthritis (HCC) 12/18/2021   S/P revision of total knee 10/09/2021   Chronic instability of left knee 02/12/2021   Long term methotrexate  user 01/03/2021   Stage 3b chronic kidney disease (HCC) 01/03/2021   Piriformis syndrome of left side 11/15/2020   Normocytic anemia 04/22/2020   Lumbar spondylosis 02/16/2020   Arthritis of left knee 11/24/2019   Left hip pain 08/08/2019   Primary osteoarthritis of left hip 08/08/2019   Non-seasonal allergic rhinitis 04/12/2019   Bursitis of left shoulder 03/24/2019   Benign essential hypertension 03/06/2019   Proteinuria 03/06/2019   Trochanteric bursitis of left hip 10/04/2018   SI joint arthritis (left) 10/04/2018   Exertional chest pain 09/30/2018   CKD (chronic kidney disease), stage III (HCC) 09/30/2018   Psoriatic arthritis (HCC) 04/23/2018   Trigger ring finger of left hand 04/14/2018   Female pelvic pain 01/20/2018   History of revision of total knee arthroplasty 01/20/2018   Iron  deficiency anemia    Heme + stool    Acute gastritis without hemorrhage    Gastric polyps    Benign neoplasm of ascending colon    Benign neoplasm of transverse colon    Polyp of sigmoid colon    Hypertension 10/01/2017   Hypothyroidism 10/01/2017   Gastroesophageal reflux disease 10/01/2017   Hypokalemia 10/01/2017   Mixed hyperlipidemia 10/01/2017   Reactive airway disease 10/01/2017   Bradycardia 08/24/2017   Severe obesity (BMI 35.0-35.9 with comorbidity) (HCC) 08/18/2017   Chronic gouty arthropathy without tophi 06/25/2017   Encounter for long-term (current) use of high-risk  medication 06/25/2017   Positive ANA (antinuclear antibody) 06/17/2017   Cervical facet syndrome 06/16/2017   Primary osteoarthritis of right shoulder 06/16/2017   Sprain of anterior talofibular ligament of right ankle 06/16/2017   Lumbar radiculopathy 04/21/2017   Lumbar degenerative disc disease 04/21/2017   Chronic pain syndrome 04/21/2017   Spinal stenosis, lumbar region, with neurogenic claudication 04/21/2017   SS-A antibody positive 03/05/2017   Chronic knee pain after total replacement of left knee joint 08/03/2015   DOE (dyspnea on exertion) 12/28/2013    Allergies  Allergen Reactions   Ampicillin Swelling   Penicillins Anaphylaxis and Swelling    IgE = 10 (11/05/2017)  Has patient had a PCN reaction causing immediate rash, facial/tongue/throat swelling, SOB or lightheadedness with hypotension: Yes Has patient had a PCN reaction causing severe rash involving mucus membranes or skin necrosis: No Has patient had a PCN reaction that required hospitalization: No Has patient had a PCN reaction occurring within the last 10 years: Yes If all of the above answers are NO, then may proceed with Cephalosporin use.   Vancomycin Rash    Other reaction(s): Other (see comments)   Vibramycin  [Doxycycline  Calcium ] Rash    Past Surgical History:  Procedure Laterality Date   ANKLE SURGERY Right    CARPAL  TUNNEL RELEASE     x3   COLONOSCOPY  2000?   COLONOSCOPY WITH PROPOFOL  N/A 10/22/2017   Procedure: COLONOSCOPY WITH PROPOFOL ;  Surgeon: Jinny Carmine, MD;  Location: [facility];  Service: Endoscopy;  Laterality: N/A;   COLONOSCOPY WITH PROPOFOL  N/A 08/14/2022   Procedure: COLONOSCOPY WITH PROPOFOL ;  Surgeon: Jinny Carmine, MD;  Location: [facility] ENDOSCOPY;  Service: Endoscopy;  Laterality: N/A;   ESOPHAGOGASTRODUODENOSCOPY (EGD) WITH PROPOFOL  N/A 10/22/2017   Procedure: ESOPHAGOGASTRODUODENOSCOPY (EGD) WITH PROPOFOL ;  Surgeon: Jinny Carmine, MD;  Location: [facility];   Service: Endoscopy;  Laterality: N/A;   ESOPHAGOGASTRODUODENOSCOPY (EGD) WITH PROPOFOL  N/A 08/14/2022   Procedure: ESOPHAGOGASTRODUODENOSCOPY (EGD) WITH PROPOFOL ;  Surgeon: Jinny Carmine, MD;  Location: [facility] ENDOSCOPY;  Service: Endoscopy;  Laterality: N/A;   FUSION OF TALONAVICULAR JOINT Right 08/03/2015   Procedure: TAILOR NAVICULAR JOINT FUSION - RIGHT ;  Surgeon: Eva Gay, DPM;  Location: [facility] ORS;  Service: Podiatry;  Laterality: Right;   HOT HEMOSTASIS  08/14/2022   Procedure: HOT HEMOSTASIS (ARGON PLASMA COAGULATION/BICAP);  Surgeon: Jinny Carmine, MD;  Location: [facility] ENDOSCOPY;  Service: Endoscopy;;   JOINT REPLACEMENT Right    knee x 3 ,right x1 and left x2   LOOP RECORDER IMPLANT  08/22/2021   Procedure: LOOP RECORDER IMPLANT; Location: [facility]; Surgeon: Elspeth Sage, MD   POLYPECTOMY N/A 10/22/2017   Procedure: POLYPECTOMY;  Surgeon: Jinny Carmine, MD;  Location: [facility];  Service: Endoscopy;  Laterality: N/A;   POLYPECTOMY  08/14/2022   Procedure: POLYPECTOMY;  Surgeon: Jinny Carmine, MD;  Location: [facility] ENDOSCOPY;  Service: Endoscopy;;   ROTATOR CUFF REPAIR Right 1995   TONSILLECTOMY     TOTAL KNEE REVISION Right 01/20/2018   Procedure: POLYETHYLENE EXCHANGE;  Surgeon: Mardee Lynwood SQUIBB, MD;  Location: [facility] ORS;  Service: Orthopedics;  Laterality: Right;   TOTAL KNEE REVISION Left 10/09/2021   Procedure: LEFT TOTAL KNEE REVISION WITH POLYETHYLENE EXCHANGE.;  Surgeon: Mardee Lynwood SQUIBB, MD;  Location: [facility] ORS;  Service: Orthopedics;  Laterality: Left;   TUBAL LIGATION     UPPER GI ENDOSCOPY  2000?    Social History   Tobacco Use   Smoking status: Former    Current packs/day: 0.00    Average packs/day: 2.0 packs/day for 20.0 years (40.0 ttl pk-yrs)    Types: Cigarettes    Start date: 02/24/1973    Quit date: 02/24/1993    Years since quitting: 30.1   Smokeless tobacco: Never  Vaping Use   Vaping status: Never Used  Substance Use Topics   Alcohol  use: No     Alcohol /week: 0.0 standard drinks of alcohol    Drug use: No     Medication list has been reviewed and updated.  Current Meds  Medication Sig   acetaminophen  (TYLENOL ) 650 MG CR tablet Take 650 mg by mouth every 8 (eight) hours as needed for pain.   albuterol  (VENTOLIN  HFA) 108 (90 Base) MCG/ACT inhaler Inhale 2 puffs into the lungs every 6 (six) hours as needed for wheezing or shortness of breath.   allopurinol  (ZYLOPRIM ) 100 MG tablet Take 200 mg by mouth every morning.    betamethasone  dipropionate 0.05 % cream APPLY TO PSORIASIS ON HANDS TWICE DAILY ONLY. AVOID FACE, GROIN AND AXILLA   calcipotriene  (DOVONOX) 0.005 % cream APPLY TOPICALLY TO PSORIASIS AREAS ON LEGS AND FEET ONCE OR TWICE DAILY   cetirizine (ZYRTEC) 10 MG tablet Take 10 mg by mouth daily.   Cholecalciferol  (VITAMIN D3) 2000 units TABS Take 2,000 Units by mouth daily.  clobetasol  ointment (TEMOVATE ) 0.05 % Apply topically 2 (two) times daily.   diclofenac sodium (VOLTAREN) 1 % GEL Apply 2 g topically 3 (three) times daily.    Dupilumab (DUPIXENT Asher) Inject into the skin.   erythromycin  ophthalmic ointment Place 1 Application into the left eye 3 (three) times daily.   FARXIGA  10 MG TABS tablet Take 10 mg by mouth every morning.   furosemide  (LASIX ) 40 MG tablet Take 40 mg by mouth daily.   gabapentin  (NEURONTIN ) 600 MG tablet Take 1 tablet (600 mg total) by mouth at bedtime.   HYDROcodone -acetaminophen  (NORCO) 7.5-325 MG tablet Take 1 tablet by mouth every 8 (eight) hours as needed for severe pain (pain score 7-10). Must last 30 days.   JARDIANCE  10 MG TABS tablet Take 10 mg by mouth every morning.   ketoconazole  (NIZORAL ) 2 % cream Apply topically daily as needed.   levothyroxine  (SYNTHROID ) 137 MCG tablet Take 1 tablet (137 mcg total) by mouth daily before breakfast.   losartan  (COZAAR ) 25 MG tablet Take 25 mg by mouth daily.   meclizine  (ANTIVERT ) 12.5 MG tablet TAKE 1 TABLET(12.5 MG) BY MOUTH THREE TIMES DAILY AS  NEEDED FOR DIZZINESS   metoprolol  tartrate (LOPRESSOR ) 25 MG tablet TAKE 1 TABLET(25 MG) BY MOUTH TWICE DAILY   montelukast  (SINGULAIR ) 10 MG tablet TAKE 1 TABLET(10 MG) BY MOUTH AT BEDTIME   Multiple Vitamins-Calcium  (ONE-A-DAY WOMENS FORMULA PO) Take 1 tablet by mouth daily.   mupirocin  ointment (BACTROBAN ) 2 % Apply 1 application topically 2 (two) times daily.   nystatin  (MYCOSTATIN ) 100000 UNIT/ML suspension Take 5 mLs (500,000 Units total) by mouth 4 (four) times daily.   nystatin  cream (MYCOSTATIN ) Apply 1 Application topically 2 (two) times daily.   omega-3 acid ethyl esters (LOVAZA ) 1 g capsule Take 1 capsule (1 g total) by mouth 2 (two) times daily.   omeprazole  (PRILOSEC) 40 MG capsule TAKE 1 CAPSULE(40 MG) BY MOUTH DAILY   OTEZLA  30 MG TABS Take 1 tablet by mouth daily.   Polyethyl Glycol-Propyl Glycol (SYSTANE OP) Place 1 drop into both eyes daily as needed (dry eyes).   potassium chloride  SA (KLOR-CON  M) 20 MEQ tablet TAKE 1 TABLET(20 MEQ) BY MOUTH DAILY   predniSONE  (STERAPRED UNI-PAK 21 TAB) 10 MG (21) TBPK tablet Day 1 take 6 tablets, Day 2 take 5 tablets, Day 3 take 4 tablets, Day 4 take 3 tablets, Day 5 take 2 tablets D6 take 1 tablet   tiZANidine  (ZANAFLEX ) 4 MG tablet Take 1 tablet (4 mg total) by mouth at bedtime.   triamcinolone  (KENALOG ) 0.1 % Apply 1 application topically 2 (two) times daily.   Upadacitinib (RINVOQ PO) Take by mouth.       04/02/2023    3:03 PM 09/25/2022    3:17 PM 03/27/2022    3:33 PM 11/25/2021    2:52 PM  GAD 7 : Generalized Anxiety Score  Nervous, Anxious, on Edge 0 0 0 0  Control/stop worrying 0 0 0 0  Worry too much - different things 0 0 0 0  Trouble relaxing 0 0 0 0  Restless 0 0 0 0  Easily annoyed or irritable 0 0 0 0  Afraid - awful might happen 0 0 0 0  Total GAD 7 Score 0 0 0 0  Anxiety Difficulty Not difficult at all Not difficult at all Not difficult at all        04/02/2023    3:03 PM 04/01/2023    3:24 PM 01/08/2023  3:03 PM   Depression screen PHQ 2/9  Decreased Interest 0 0 0  Down, Depressed, Hopeless 0 0 0  PHQ - 2 Score 0 0 0  Altered sleeping 0 0   Tired, decreased energy 0 0   Change in appetite 0 0   Feeling bad or failure about yourself  0 0   Trouble concentrating 0 0   Moving slowly or fidgety/restless 0 0   Suicidal thoughts 0 0   PHQ-9 Score 0 0   Difficult doing work/chores Not difficult at all Not difficult at all     BP Readings from Last 3 Encounters:  04/02/23 128/74  03/30/23 139/77  03/25/23 129/76    Physical Exam Constitutional:      General: She is not in acute distress.    Appearance: She is not diaphoretic.  HENT:     Head: Normocephalic and atraumatic.     Right Ear: Tympanic membrane and external ear normal.     Left Ear: Tympanic membrane and external ear normal.     Nose: Nose normal.     Mouth/Throat:     Mouth: Mucous membranes are moist.     Pharynx: Posterior oropharyngeal erythema present.  Eyes:     General:        Right eye: No discharge.        Left eye: No discharge.     Conjunctiva/sclera: Conjunctivae normal.     Pupils: Pupils are equal, round, and reactive to light.  Neck:     Thyroid : No thyromegaly.     Vascular: No JVD.  Cardiovascular:     Rate and Rhythm: Normal rate and regular rhythm.     Heart sounds: Normal heart sounds, S1 normal and S2 normal. No murmur heard.    No systolic murmur is present.     No diastolic murmur is present.     No friction rub. No gallop. No S3 or S4 sounds.  Pulmonary:     Effort: Pulmonary effort is normal.     Breath sounds: Normal breath sounds. No decreased breath sounds, wheezing, rhonchi or rales.  Abdominal:     General: Bowel sounds are normal.     Palpations: Abdomen is soft. There is no mass.     Tenderness: There is no abdominal tenderness. There is no guarding.  Musculoskeletal:        General: Normal range of motion.     Cervical back: Normal range of motion and neck supple.  Lymphadenopathy:      Cervical: No cervical adenopathy.  Skin:    General: Skin is warm and dry.  Neurological:     Mental Status: She is alert.     Deep Tendon Reflexes: Reflexes are normal and symmetric.     Wt Readings from Last 3 Encounters:  04/02/23 259 lb (117.5 kg)  03/25/23 261 lb 1.6 oz (118.4 kg)  01/08/23 236 lb (107 kg)    BP 128/74   Pulse 61   Temp 98.6 F (37 C) (Oral)   Ht 5' 11 (1.803 m)   Wt 259 lb (117.5 kg)   SpO2 98%   BMI 36.12 kg/m   Assessment and Plan: 1. Hypothyroidism, unspecified type Chronic.  Controlled.  Stable.  Doing well with current dosing of levothyroxine  will continue at 137 mcg daily pending TSH..  Will recheck in 6 months. - levothyroxine  (SYNTHROID ) 137 MCG tablet; Take 1 tablet (137 mcg total) by mouth daily before breakfast.  Dispense: 90 tablet; Refill: 1  2. Mixed hyperlipidemia Chronic.  Controlled.  Stable.  Currently taking omega-3 and is doing well with the [place]  1 g twice a day.  Will check lipid panel for current level of dosing. - omega-3 acid ethyl esters (LOVAZA ) 1 g capsule; Take 1 capsule (1 g total) by mouth 2 (two) times daily.  Dispense: 180 capsule; Refill: 1 - Lipid Panel With LDL/HDL Ratio  3. Gastroesophageal reflux disease Chronic.  Controlled.  Stable.  Reflux is controlled with omeprazole  40 mg once a day. - omeprazole  (PRILOSEC) 40 MG capsule; TAKE 1 CAPSULE(40 MG) BY MOUTH DAILY  Dispense: 90 capsule; Refill: 1  4. Hypokalemia Chronic.  Controlled.  Stable.  Supplemental potassium for hypokalemic state at dosing of 20 mg daily.  Will check CMP for current level of electrolyte levels. - potassium chloride  SA (KLOR-CON  M) 20 MEQ tablet; Take 1 tablet (20 mEq total) by mouth daily.  Dispense: 90 tablet; Refill: 1  5. Hypertension, unspecified type (Primary) Chronic.  Controlled.  Stable.  Blood pressure today is 128/74.  Asymptomatic.  Tolerating current dosing of medication which is metoprolol  25 mg twice a day.  Will  check CMP for electrolytes and GFR.  And pending studies will likely continue at current dosing of metoprolol  as noted above.  Will recheck patient in 6 months. - metoprolol  tartrate (LOPRESSOR ) 25 MG tablet; Take 1 tablet (25 mg total) by mouth 2 (two) times daily.  Dispense: 180 tablet; Refill: 1 - Comprehensive metabolic panel  6. Candidiasis Patient is using a mouthwash that is maybe alcohol -based and that it burns her mouth.  I have suggested that she hold this and we will try some nystatin  suspension teaspoon swish and swallow 4 times a day. - nystatin  (MYCOSTATIN ) 100000 UNIT/ML suspension; Take 5 mLs (500,000 Units total) by mouth 4 (four) times daily.  Dispense: 60 mL; Refill: 0     Cathryne Molt, MD

## 2023-04-03 LAB — COMPREHENSIVE METABOLIC PANEL
ALT: 14 [IU]/L (ref 0–32)
AST: 29 [IU]/L (ref 0–40)
Albumin: 4.3 g/dL (ref 3.8–4.8)
Alkaline Phosphatase: 104 [IU]/L (ref 44–121)
BUN/Creatinine Ratio: 12 (ref 12–28)
BUN: 25 mg/dL (ref 8–27)
Bilirubin Total: 0.2 mg/dL (ref 0.0–1.2)
CO2: 27 mmol/L (ref 20–29)
Calcium: 9.8 mg/dL (ref 8.7–10.3)
Chloride: 104 mmol/L (ref 96–106)
Creatinine, Ser: 2.06 mg/dL — ABNORMAL HIGH (ref 0.57–1.00)
Globulin, Total: 2.4 g/dL (ref 1.5–4.5)
Glucose: 87 mg/dL (ref 70–99)
Potassium: 4.4 mmol/L (ref 3.5–5.2)
Sodium: 144 mmol/L (ref 134–144)
Total Protein: 6.7 g/dL (ref 6.0–8.5)
eGFR: 25 mL/min/{1.73_m2} — ABNORMAL LOW (ref >59)

## 2023-04-03 LAB — LIPID PANEL WITH LDL/HDL RATIO
Cholesterol, Total: 233 mg/dL — ABNORMAL HIGH (ref 100–199)
HDL: 41 mg/dL (ref >39)
LDL Chol Calc (NIH): 146 mg/dL — ABNORMAL HIGH (ref 0–99)
LDL/HDL Ratio: 3.6 {ratio} — ABNORMAL HIGH (ref 0.0–3.2)
Triglycerides: 253 mg/dL — ABNORMAL HIGH (ref 0–149)
VLDL Cholesterol Cal: 46 mg/dL — ABNORMAL HIGH (ref 5–40)

## 2023-04-04 ENCOUNTER — Other Ambulatory Visit: Payer: Self-pay | Admitting: Family Medicine

## 2023-04-04 LAB — TSH: TSH: 0.495 u[IU]/mL (ref 0.450–4.500)

## 2023-04-06 ENCOUNTER — Ambulatory Visit (INDEPENDENT_AMBULATORY_CARE_PROVIDER_SITE_OTHER): Payer: Medicare Other

## 2023-04-06 ENCOUNTER — Inpatient Hospital Stay: Payer: 59

## 2023-04-06 DIAGNOSIS — R002 Palpitations: Secondary | ICD-10-CM

## 2023-04-06 LAB — CUP PACEART REMOTE DEVICE CHECK
Date Time Interrogation Session: 20250209231659
Implantable Pulse Generator Implant Date: 20230629

## 2023-04-13 NOTE — Progress Notes (Signed)
 [facility] Summary Report / Loop Recorder

## 2023-04-13 NOTE — Addendum Note (Signed)
Addended by: Geralyn Flash D on: 04/13/2023 12:42 PM   Modules accepted: Orders

## 2023-04-14 ENCOUNTER — Ambulatory Visit
Payer: 59 | Attending: Student in an Organized Health Care Education/Training Program | Admitting: Student in an Organized Health Care Education/Training Program

## 2023-04-14 ENCOUNTER — Encounter: Payer: Self-pay | Admitting: Student in an Organized Health Care Education/Training Program

## 2023-04-14 VITALS — BP 126/95 | HR 74 | Temp 98.8°F | Resp 18 | Ht 71.0 in | Wt 246.0 lb

## 2023-04-14 DIAGNOSIS — M1612 Unilateral primary osteoarthritis, left hip: Secondary | ICD-10-CM | POA: Diagnosis not present

## 2023-04-14 DIAGNOSIS — M5136 Other intervertebral disc degeneration, lumbar region with discogenic back pain only: Secondary | ICD-10-CM | POA: Insufficient documentation

## 2023-04-14 DIAGNOSIS — G894 Chronic pain syndrome: Secondary | ICD-10-CM | POA: Diagnosis not present

## 2023-04-14 DIAGNOSIS — M47812 Spondylosis without myelopathy or radiculopathy, cervical region: Secondary | ICD-10-CM | POA: Diagnosis not present

## 2023-04-14 DIAGNOSIS — G5702 Lesion of sciatic nerve, left lower limb: Secondary | ICD-10-CM | POA: Insufficient documentation

## 2023-04-14 DIAGNOSIS — M47816 Spondylosis without myelopathy or radiculopathy, lumbar region: Secondary | ICD-10-CM | POA: Diagnosis not present

## 2023-04-14 MED ORDER — HYDROCODONE-ACETAMINOPHEN 7.5-325 MG PO TABS: 1.0000 | ORAL_TABLET | Freq: Three times a day (TID) | ORAL | 0 refills | Status: AC | PRN

## 2023-04-14 MED ORDER — TIZANIDINE HCL 4 MG PO TABS: 4.0000 mg | ORAL_TABLET | Freq: Every day | ORAL | 5 refills | Status: AC

## 2023-04-14 MED ORDER — HYDROCODONE-ACETAMINOPHEN 7.5-325 MG PO TABS: 1.0000 | ORAL_TABLET | Freq: Three times a day (TID) | ORAL | 0 refills | Status: DC | PRN

## 2023-04-14 MED ORDER — GABAPENTIN 600 MG PO TABS
600.0000 mg | ORAL_TABLET | Freq: Every day | ORAL | 5 refills | Status: DC
Start: 2023-04-14 — End: 2024-01-05

## 2023-04-14 NOTE — Patient Instructions (Addendum)
Gabapentin pick up today  Tizanidine pick up 04/14/23 Hydrocodone pick up 04/21/23, 05/21/23, 06/20/23   Call us for when you are ready for procedure.     Procedure instructions  Do not eat or drink fluids (other than water) for 6 hours before your procedure  No water for 2 hours before your procedure  Take your blood pressure medicine with a sip of water  Arrive 30 minutes before your appointment  Carefully read the "Preparing for your procedure" detailed instructions  If you have questions call us at [phone]  _____________________________________________________________________    ______________________________________________________________________  Preparing for your procedure  Appointments: If you think you may not be able to keep your appointment, call 24-48 hours in advance to cancel. We need time to make it available to others.  During your procedure appointment there will be: No Prescription Refills. No disability issues to discussed. No medication changes or discussions.  Instructions: Food intake: Avoid eating anything solid for at least 8 hours prior to your procedure. Clear liquid intake: You may take clear liquids such as water up to 2 hours prior to your procedure. (No carbonated drinks. No soda.) Transportation: Unless otherwise stated by your physician, bring a driver. Morning Medicines: Except for blood thinners, take all of your other morning medications with a sip of water. Make sure to take your heart and blood pressure medicines. If your blood pressure's lower number is above 100, the case will be rescheduled. Blood thinners: Make sure to stop your blood thinners as instructed.  If you take a blood thinner, but were not instructed to stop it, call our office [phone] and ask to talk to a nurse. Not stopping a blood thinner prior to certain procedures could lead to serious complications. Diabetics on insulin: Notify the staff so that you  can be scheduled 1st case in the morning. If your diabetes requires high dose insulin, take only  of your normal insulin dose the morning of the procedure and notify the staff that you have done so. Preventing infections: Shower with an antibacterial soap the morning of your procedure.  Build-up your immune system: Take 1000 mg of Vitamin C with every meal (3 times a day) the day prior to your procedure. Antibiotics: Inform the nursing staff if you are taking any antibiotics or if you have any conditions that may require antibiotics prior to procedures. (Example: recent joint implants)   Pregnancy: If you are pregnant make sure to notify the nursing staff. Not doing so may result in injury to the fetus, including death.  Sickness: If you have a cold, fever, or any active infections, call and cancel or reschedule your procedure. Receiving steroids while having an infection may result in complications. Arrival: You must be in the facility at least 30 minutes prior to your scheduled procedure. Tardiness: Your scheduled time is also the cutoff time. If you do not arrive at least 15 minutes prior to your procedure, you will be rescheduled.  Children: Do not bring any children with you. Make arrangements to keep them home. Dress appropriately: There is always a possibility that your clothing may get soiled. Avoid long dresses. Valuables: Do not bring any jewelry or valuables.  Reasons to call and reschedule or cancel your procedure: (Following these recommendations will minimize the risk of a serious complication.) Surgeries: Avoid having procedures within 2 weeks of any surgery. (Avoid for 2 weeks before or after any surgery). Flu Shots: Avoid having procedures within 2 weeks of a flu shots  or . (Avoid for 2 weeks before or after immunizations). Barium: Avoid having a procedure within 7-10 days after having had a radiological study involving the use of radiological contrast. (Myelograms, Barium swallow or  enema study). Heart attacks: Avoid any elective procedures or surgeries for the initial 6 months after a "Myocardial Infarction" (Heart Attack). Blood thinners: It is imperative that you stop these medications before procedures. Let us know if you if you take any blood thinner.  Infection: Avoid procedures during or within two weeks of an infection (including chest colds or gastrointestinal problems). Symptoms associated with infections include: Localized redness, fever, chills, night sweats or profuse sweating, burning sensation when voiding, cough, congestion, stuffiness, runny nose, sore throat, diarrhea, nausea, vomiting, cold or Flu symptoms, recent or current infections. It is specially important if the infection is over the area that we intend to treat. Heart and lung problems: Symptoms that may suggest an active cardiopulmonary problem include: cough, chest pain, breathing difficulties or shortness of breath, dizziness, ankle swelling, uncontrolled high or unusually low blood pressure, and/or palpitations. If you are experiencing any of these symptoms, cancel your procedure and contact your primary care physician for an evaluation.  Remember:  Regular Business hours are:  Monday to Thursday 8:00 AM to 4:00 PM  Provider's Schedule: Delano Metz, MD:  Procedure days: Tuesday and Thursday 7:30 AM to 4:00 PM  Edward Jolly, MD:  Procedure days: Monday and Wednesday 7:30 AM to 4:00 PM

## 2023-04-14 NOTE — Progress Notes (Signed)
Nursing Pain Medication Assessment:  Safety precautions to be maintained throughout the outpatient stay will include: orient to surroundings, keep bed in low position, maintain call bell within reach at all times, provide assistance with transfer out of bed and ambulation.  Medication Inspection Compliance: Pill count conducted under aseptic conditions, in front of the patient. Neither the pills nor the bottle was removed from the patient's sight at any time. Once count was completed pills were immediately returned to the patient in their original bottle.  Medication: Hydrocodone/APAP Pill/Patch Count:  28 of 90 pills remain Pill/Patch Appearance: Markings consistent with prescribed medication Bottle Appearance: Standard pharmacy container. Clearly labeled. Filled Date: 01 / 25 / 2025 Last Medication intake:  Today

## 2023-04-14 NOTE — Progress Notes (Signed)
PROVIDER NOTE: Information contained herein reflects review and annotations entered in association with encounter. Interpretation of such information and data should be left to medically-trained personnel. Information provided to patient can be located elsewhere in the medical record under "Patient Instructions". Document created using STT-dictation technology, any transcriptional errors that may result from process are unintentional.    Patient: Michele Moore  Service Category: E/M  Provider: Edward Jolly, MD  DOB: 1950/05/14  DOS: 04/14/2023  Referring Provider: Duanne Limerick, MD  MRN: [id number]  Specialty: Interventional Pain Management  PCP: Michele Limerick, MD  Type: Established Patient  Setting: Ambulatory outpatient    Location: Office  Delivery: Face-to-face     HPI  [name], a 73 y.o. year old female, is here today because of her Lumbar spondylosis [M47.816]. [name] primary complain today is Back Pain and Hip Pain (left)  Pertinent problems: [name] has Chronic knee pain after total replacement of left knee joint; Lumbar radiculopathy; Lumbar degenerative disc disease; Chronic pain syndrome; Spinal stenosis, lumbar region, with neurogenic claudication; Cervical facet syndrome; Primary osteoarthritis of right shoulder; Sprain of anterior talofibular ligament of right ankle; Severe obesity (BMI 35.0-35.9 with comorbidity) (HCC); CKD (chronic kidney disease), stage III (HCC); Primary osteoarthritis of left hip; and Lumbar spondylosis on their pertinent problem list. Pain Assessment: Severity of Chronic pain is reported as a 7 /10. Location: Back Lower/ . Onset: More than a month ago. Quality:  . Timing: Constant. Modifying factor(s): meds. Vitals:  height is 5\' 11"  (1.803 m) and weight is 246 lb (111.6 kg). Her temperature is 98.8 F (37.1 C). Her blood pressure is 126/95 (abnormal) and her pulse is 74. Her respiration is 18 and oxygen saturation is 100%.  BMI: Estimated body mass  index is 34.31 kg/m as calculated from the following:   Height as of this encounter: 5\' 11"  (1.803 m).   Weight as of this encounter: 246 lb (111.6 kg). Last encounter: 10/14/22 Last procedure: 05/05/2022.  Reason for encounter: medication management & increased low back pain  Discussed the use of AI scribe software for clinical note transcription with the patient, who gave verbal consent to proceed.  History of Present Illness   Michele Moore is a 73 year old female who presents with back pain and headaches following a fall.  She experiences significant back pain following a fall on January 12th. The pain is located in the lower back, radiating into the buttock and particularly affecting the left hip. It is most severe in the morning, requiring her to sit on the bed for about thirty minutes before she can walk. The pain originates from the middle of her back and extends to the left side. Her current medications include hydrocodone, tizanidine, and gabapentin, which are being refilled.  She has persistent headaches after hitting the back of her head during the fall.   She sustained bruising on the left side of her ribs during the fall, which was notably painful for about two weeks.  She twisted her left ankle during the fall, contributing to her overall discomfort.       Pharmacotherapy Assessment  Analgesic: Norco 7.5 mg TID PRN #90/month, 22.5    Monitoring: Michele Moore PMP: PDMP reviewed during this encounter.       Pharmacotherapy: No side-effects or adverse reactions reported. Compliance: No problems identified. Effectiveness: Clinically acceptable.  Michele Salts, RN  04/14/2023  2:48 PM  Sign when Signing Visit Nursing Pain Medication Assessment:  Safety  precautions to be maintained throughout the outpatient stay will include: orient to surroundings, keep bed in low position, maintain call bell within reach at all times, provide assistance with transfer out of bed and ambulation.   Medication Inspection Compliance: Pill count conducted under aseptic conditions, in front of the patient. Neither the pills nor the bottle was removed from the patient's sight at any time. Once count was completed pills were immediately returned to the patient in their original bottle.  Medication: Hydrocodone/APAP Pill/Patch Count:  28 of 90 pills remain Pill/Patch Appearance: Markings consistent with prescribed medication Bottle Appearance: Standard pharmacy container. Clearly labeled. Filled Date: 01 / 25 / 2025 Last Medication intake:  Today    No results found for: "CBDTHCR" No results found for: "D8THCCBX" No results found for: "D9THCCBX"  UDS:  Summary  Date Value Ref Range Status  07/17/2022 Note  Final    Comment:    ==================================================================== [employer] [facility] 13 (MW) ==================================================================== Test                             Result       Flag       Units  Drug Present and Declared for Prescription Verification   Hydrocodone                    741          EXPECTED   ng/mg creat   Hydromorphone                  182          EXPECTED   ng/mg creat   Norhydrocodone                 339          EXPECTED   ng/mg creat    Sources of hydrocodone include scheduled prescription medications.    Hydromorphone and norhydrocodone are expected metabolites of    hydrocodone. Hydromorphone is also available as a scheduled    prescription medication.  ==================================================================== Test                      Result    Flag   Units      Ref Range   Creatinine              51               mg/dL      >=65 ==================================================================== Declared Medications:  The flagging and interpretation on this report are based on the  following declared medications.  Unexpected results may arise from  inaccuracies in the declared  medications.   **Note: The testing scope of this panel includes these medications:   Hydrocodone (Norco)   **Note: The testing scope of this panel does not include the  following reported medications:   Acetaminophen (Tylenol)  Acetaminophen (Norco)  Albuterol (Ventolin HFA)  Allopurinol (Zyloprim)  Apremilast (Otezla)  Betamethasone  Calcipotriene  Calcium  Clobetasol (Temovate)  Dapagliflozin (Farxiga)  Fluticasone (Flonase)  Furosemide (Lasix)  Gabapentin (Neurontin)  Ketoconazole (Nizoral)  Levothyroxine (Synthroid)  Losartan (Cozaar)  Meclizine (Antivert)  Metoprolol (Lopressor)  Mometasone (Nasonex)  Montelukast (Singulair)  Multivitamin  Mupirocin (Bactroban)  Nystatin (Mycostatin)  Omega-3 Fatty Acids  Omeprazole (Prilosec)  Polyethylene Glycol  Potassium (Klor-Con)  Tizanidine (Zanaflex)  Topical Diclofenac (Voltaren)  Triamcinolone (Kenalog)  Vitamin D3 ==================================================================== For clinical consultation, please call [phone]. ====================================================================       ROS  Constitutional: Denies any fever or chills Gastrointestinal: No reported hemesis, hematochezia, vomiting, or acute GI distress Musculoskeletal:  Low back pain with radiation into left buttock>right  and left>right hip Neurological:  Numbness overlying left buttock  Medication Review  Apremilast, Dupilumab, HYDROcodone-acetaminophen, Multiple Vitamins-Calcium, Polyethyl Glycol-Propyl Glycol, Upadacitinib, Vitamin D3, acetaminophen, albuterol, allopurinol, betamethasone dipropionate, calcipotriene, cetirizine, clobetasol ointment, dapagliflozin propanediol, diclofenac sodium, empagliflozin, erythromycin, fluticasone, furosemide, gabapentin, ketoconazole, levothyroxine, losartan, meclizine, metoprolol tartrate, mometasone, montelukast, mupirocin ointment, nystatin, nystatin cream, omega-3 acid ethyl esters,  omeprazole, potassium chloride SA, predniSONE, tiZANidine, and triamcinolone cream  History Review  Allergy: [name] is allergic to ampicillin, penicillins, vancomycin, and vibramycin [doxycycline calcium]. Drug: Ms. Defoor  reports no history of drug use. Alcohol:  reports no history of alcohol use. Tobacco:  reports that she quit smoking about 30 years ago. Her smoking use included cigarettes. She started smoking about 50 years ago. She has a 40 pack-year smoking history. She has never used smokeless tobacco. Social: Ms. Paradiso  reports that she quit smoking about 30 years ago. Her smoking use included cigarettes. She started smoking about 50 years ago. She has a 40 pack-year smoking history. She has never used smokeless tobacco. She reports that she does not drink alcohol and does not use drugs. Medical:  has a past medical history of (HFpEF) heart failure with preserved ejection fraction (HCC) (01/13/2014), Allergy, ANA positive, Anemia of chronic renal failure, stage 3 (moderate), unspecified whether stage 3a or 3b CKD (HCC), Aortic atherosclerosis (HCC), Asthma, Cataract, Chronic pain syndrome, Chronic, continuous use of opioids, CKD (chronic kidney disease), stage III (HCC), Complication of anesthesia, DDD (degenerative disc disease), lumbar, Dyspnea on exertion, Dysrhythmia, Family history of adverse reaction to anesthesia, GERD (gastroesophageal reflux disease), Glaucoma, Gout, H/O tooth extraction, Hemorrhoid, History of hiatal hernia, History of kidney stones, Hypertension, Hypothyroidism, Implantable loop recorder present (08/22/2021), Long term current use of immunosuppressive drug, Migraines, Mixed hyperlipidemia, Multiple gastric ulcers, Osteoarthritis, Psoriasis, Psoriatic arthritis (HCC), Rheumatoid arthritis (HCC), SS-A antibody positive, and Wears dentures. Surgical: Ms. Brigandi  has a past surgical history that includes Carpal tunnel release; Rotator cuff repair (Right, 1995); Ankle surgery  (Right); Tubal ligation; Colonoscopy (2000?); Upper gi endoscopy (2000?); Tonsillectomy; Fusion of talonavicular joint (Right, 08/03/2015); Colonoscopy with propofol (N/A, 10/22/2017); Esophagogastroduodenoscopy (egd) with propofol (N/A, 10/22/2017); polypectomy (N/A, 10/22/2017); Total knee revision (Right, 01/20/2018); Joint replacement (Right); Loop recorder implant (08/22/2021); Total knee revision (Left, 10/09/2021); Colonoscopy with propofol (N/A, 08/14/2022); Esophagogastroduodenoscopy (egd) with propofol (N/A, 08/14/2022); Hot hemostasis (08/14/2022); and polypectomy (08/14/2022). Family: family history includes Arthritis in her mother; COPD in her sister; Cancer in her maternal aunt and maternal uncle; Diabetes in her father; Gout in her mother; Heart failure in her father and mother; Hyperlipidemia in her father; Hypertension in her mother; Stroke in her mother.  Laboratory Chemistry Profile   Renal Lab Results  Component Value Date   BUN 25 04/02/2023   CREATININE 2.06 (H) 04/02/2023   BCR 12 04/02/2023   GFRAA 30 (L) 08/28/2019   GFRNONAA 24 (L) 03/14/2023    Hepatic Lab Results  Component Value Date   AST 29 04/02/2023   ALT 14 04/02/2023   ALBUMIN 4.3 04/02/2023   ALKPHOS 104 04/02/2023    Electrolytes Lab Results  Component Value Date   NA 144 04/02/2023   K 4.4 04/02/2023   CL 104 04/02/2023   CALCIUM 9.8 04/02/2023   MG 2.1 10/26/2022   PHOS 3.0 09/25/2022  Bone No results found for: "VD25OH", "VD125OH2TOT", "GN5621HY8", "MV7846NG2", "25OHVITD1", "25OHVITD2", "25OHVITD3", "TESTOFREE", "TESTOSTERONE"  Inflammation (CRP: Acute Phase) (ESR: Chronic Phase) Lab Results  Component Value Date   CRP 1.6 (H) 09/27/2021   ESRSEDRATE 79 (H) 09/27/2021         Note: Above Lab results reviewed.  Recent Imaging Review  CLINICAL DATA:  Acute low back pain following fall 4 days ago. Initial encounter.   EXAM: LUMBAR SPINE - COMPLETE 4+ VIEW   COMPARISON:  11/16/2015    FINDINGS: No acute fracture or subluxation noted.   Multilevel degenerative disc disease, spondylosis and facet arthropathy, moderate to severe at L4-5 and L5-S1.   A LEFT L5 pars defect may be present.   No suspicious focal bony abnormalities are noted.   IMPRESSION: 1. No evidence of acute abnormality. 2. Multilevel degenerative changes, moderate to severe at L4-5 and L5-S1. 3. Equivocal LEFT L5 pars defect. Note: Reviewed        Physical Exam  General appearance: Well nourished, well developed, and well hydrated. In no apparent acute distress Mental status: Alert, oriented x 3 (person, place, & time)       Respiratory: No evidence of acute respiratory distress Eyes: PERLA Vitals: BP (!) 126/95   Pulse 74   Temp 98.8 F (37.1 C)   Resp 18   Ht 5\' 11"  (1.803 m)   Wt 246 lb (111.6 kg)   SpO2 100%   BMI 34.31 kg/m  BMI: Estimated body mass index is 34.31 kg/m as calculated from the following:   Height as of this encounter: 5\' 11"  (1.803 m).   Weight as of this encounter: 246 lb (111.6 kg). Ideal: Ideal body weight: 70.8 kg (156 lb 1.4 oz) Adjusted ideal body weight: 87.1 kg (192 lb 0.8 oz)  Lumbar Spine Area Exam  Skin & Axial Inspection: No masses, redness, or swelling Alignment: Symmetrical Functional ROM: Improved after treatment       Stability: No instability detected Muscle Tone/Strength: Functionally intact. No obvious neuro-muscular anomalies detected. Sensory (Neurological): lumbar facet pain  Pain with lumbar extension and facet loading   Gait & Posture Assessment  Ambulation: Limited Gait: Antalgic Posture: Difficulty standing up straight, due to pain    Lower Extremity Exam      Side: Right lower extremity   Side: Left lower extremity  Stability: No instability observed           Stability: No instability observed          Skin & Extremity Inspection: Skin color, temperature, and hair growth are WNL. No peripheral edema or cyanosis. No masses,  redness, swelling, asymmetry, or associated skin lesions. No contractures.   Skin & Extremity Inspection: Skin color, temperature, and hair growth are WNL. No peripheral edema or cyanosis. No masses, redness, swelling, asymmetry, or associated skin lesions. No contractures.  Functional ROM: Unrestricted ROM                   Functional ROM: Decreased ROM for hip and knee joints          Muscle Tone/Strength: Functionally intact. No obvious neuro-muscular anomalies detected.   Muscle Tone/Strength: Functionally intact. No obvious neuro-muscular anomalies detected.  Sensory (Neurological): Unimpaired   Sensory (Neurological): Dermatomal pain pattern and arthropathic of left knee  Palpation: No palpable anomalies   Palpation: No palpable anomalies    Assessment   Diagnosis Status  1. Lumbar spondylosis   2. Degeneration of intervertebral disc of lumbar region with discogenic  back pain   3. Primary osteoarthritis of left hip   4. Piriformis syndrome of left side   5. Chronic pain syndrome   6. Cervical spondylosis without myelopathy   7. Lumbar degenerative disc disease     Persistent Persistent Persistent    Plan of Care    [name] has a current medication list which includes the following long-term medication(s): albuterol, allopurinol, cetirizine, metoprolol tartrate, montelukast, omega-3 acid ethyl esters, omeprazole, potassium chloride sa, gabapentin, [DISCONTINUED] fluticasone, and [DISCONTINUED] mometasone.  Michele Moore has a history of greater than 3 months of moderate to severe pain which is resulted in functional impairment.  The patient has tried various conservative therapeutic options such as NSAIDs, Tylenol, muscle relaxants, physical therapy which was inadequately effective.  Patient's pain is predominantly axial with physical exam and xray findings suggestive of facet arthropathy.  Lumbar facet medial branch nerve blocks were discussed with the patient.  Risks  and benefits were reviewed.  Patient would like to proceed with bilateral L3, L4, L5 medial branch nerve block.   Pharmacotherapy (Medications Ordered): Meds ordered this encounter  Medications   HYDROcodone-acetaminophen (NORCO) 7.5-325 MG tablet    Sig: Take 1 tablet by mouth every 8 (eight) hours as needed for severe pain (pain score 7-10). Must last 30 days.    Dispense:  90 tablet    Refill:  0    Chronic Pain: STOP Act (Not applicable) Fill 1 day early if closed on refill date. Avoid benzodiazepines within 8 hours of opioids   HYDROcodone-acetaminophen (NORCO) 7.5-325 MG tablet    Sig: Take 1 tablet by mouth every 8 (eight) hours as needed for severe pain (pain score 7-10). Must last 30 days.    Dispense:  90 tablet    Refill:  0    Chronic Pain: STOP Act (Not applicable) Fill 1 day early if closed on refill date. Avoid benzodiazepines within 8 hours of opioids   HYDROcodone-acetaminophen (NORCO) 7.5-325 MG tablet    Sig: Take 1 tablet by mouth every 8 (eight) hours as needed for severe pain (pain score 7-10). Must last 30 days.    Dispense:  90 tablet    Refill:  0    Chronic Pain: STOP Act (Not applicable) Fill 1 day early if closed on refill date. Avoid benzodiazepines within 8 hours of opioids   gabapentin (NEURONTIN) 600 MG tablet    Sig: Take 1 tablet (600 mg total) by mouth at bedtime.    Dispense:  30 tablet    Refill:  5   tiZANidine (ZANAFLEX) 4 MG tablet    Sig: Take 1 tablet (4 mg total) by mouth at bedtime.    Dispense:  60 tablet    Refill:  5    Orders:  Orders Placed This Encounter  Procedures   LUMBAR FACET(MEDIAL BRANCH NERVE BLOCK) MBNB    Standing Status:   Future    Expiration Date:   07/12/2023    Scheduling Instructions:     Procedure: Lumbar facet block (AKA.: Lumbosacral medial branch nerve block)     Side: Bilateral     Level:L3-4, L4-5, Facets ( L3, L4, L5, Medial Branch)     Sedation: without     Timeframe: ASAA    Where will this procedure  be performed?:   [facility]   Follow-up plan:   Return in about 3 months (around 07/12/2023) for MM, F2F.      Recent Visits No visits were found meeting  these conditions. Showing recent visits within past 90 days and meeting all other requirements Today's Visits Date Type Provider Dept  04/14/23 Office Visit Michele Jolly, MD [facility]  Showing today's visits and meeting all other requirements Future Appointments No visits were found meeting these conditions. Showing future appointments within next 90 days and meeting all other requirements  I discussed the assessment and treatment plan with the patient. The patient was provided an opportunity to ask questions and all were answered. The patient agreed with the plan and demonstrated an understanding of the instructions.  Patient advised to call back or seek an in-person evaluation if the symptoms or condition worsens.  Duration of encounter: .  Total time on encounter, as per AMA guidelines included both the face-to-face and non-face-to-face time personally spent by the physician and/or other qualified health care professional(s) on the day of the encounter (includes time in activities that require the physician or other qualified health care professional and does not include time in activities normally performed by clinical staff). Physician's time may include the following activities when performed: Preparing to see the patient (e.g., pre-charting review of records, searching for previously ordered imaging, lab work, and nerve conduction tests) Review of prior analgesic pharmacotherapies. Reviewing PMP Interpreting ordered tests (e.g., lab work, imaging, nerve conduction tests) Performing post-procedure evaluations, including interpretation of diagnostic procedures Obtaining and/or reviewing separately obtained history Performing a medically appropriate examination and/or evaluation Counseling and  educating the patient/family/caregiver Ordering medications, tests, or procedures Referring and communicating with other health care professionals (when not separately reported) Documenting clinical information in the electronic or other health record Independently interpreting results (not separately reported) and communicating results to the patient/ family/caregiver Care coordination (not separately reported)  Note by: Michele Jolly, MD Date: 04/14/2023; Time: 3:28 PM

## 2023-04-21 DIAGNOSIS — I10 Essential (primary) hypertension: Secondary | ICD-10-CM | POA: Diagnosis not present

## 2023-04-21 DIAGNOSIS — N184 Chronic kidney disease, stage 4 (severe): Secondary | ICD-10-CM | POA: Diagnosis not present

## 2023-04-21 DIAGNOSIS — N2581 Secondary hyperparathyroidism of renal origin: Secondary | ICD-10-CM | POA: Diagnosis not present

## 2023-04-21 DIAGNOSIS — D631 Anemia in chronic kidney disease: Secondary | ICD-10-CM | POA: Diagnosis not present

## 2023-04-21 DIAGNOSIS — R809 Proteinuria, unspecified: Secondary | ICD-10-CM | POA: Diagnosis not present

## 2023-04-22 ENCOUNTER — Inpatient Hospital Stay: Payer: 59

## 2023-04-22 ENCOUNTER — Other Ambulatory Visit: Payer: Self-pay | Admitting: Oncology

## 2023-04-22 DIAGNOSIS — D631 Anemia in chronic kidney disease: Secondary | ICD-10-CM | POA: Diagnosis not present

## 2023-04-22 DIAGNOSIS — N1832 Chronic kidney disease, stage 3b: Secondary | ICD-10-CM

## 2023-04-22 DIAGNOSIS — I130 Hypertensive heart and chronic kidney disease with heart failure and stage 1 through stage 4 chronic kidney disease, or unspecified chronic kidney disease: Secondary | ICD-10-CM | POA: Diagnosis not present

## 2023-04-22 DIAGNOSIS — N183 Chronic kidney disease, stage 3 unspecified: Secondary | ICD-10-CM

## 2023-04-22 DIAGNOSIS — Z79899 Other long term (current) drug therapy: Secondary | ICD-10-CM | POA: Diagnosis not present

## 2023-04-22 DIAGNOSIS — D649 Anemia, unspecified: Secondary | ICD-10-CM

## 2023-04-22 LAB — HEMOGLOBIN AND HEMATOCRIT (CANCER CENTER ONLY)
HCT: 31.2 % — ABNORMAL LOW (ref 36.0–46.0)
Hemoglobin: 10 g/dL — ABNORMAL LOW (ref 12.0–15.0)

## 2023-04-22 MED ORDER — IRON SUCROSE 20 MG/ML IV SOLN
200.0000 mg | Freq: Once | INTRAVENOUS | Status: DC
Start: 2023-04-22 — End: 2023-04-22

## 2023-04-22 MED ORDER — EPOETIN ALFA-EPBX 20000 UNIT/ML IJ SOLN
20000.0000 [IU] | Freq: Once | INTRAMUSCULAR | Status: AC
  Administered 2023-04-22: 20000 [IU] via SUBCUTANEOUS
  Filled 2023-04-22: qty 1

## 2023-04-27 ENCOUNTER — Encounter: Payer: Self-pay | Admitting: Oncology

## 2023-05-05 DIAGNOSIS — H524 Presbyopia: Secondary | ICD-10-CM | POA: Diagnosis not present

## 2023-05-05 DIAGNOSIS — H40013 Open angle with borderline findings, low risk, bilateral: Secondary | ICD-10-CM | POA: Diagnosis not present

## 2023-05-05 DIAGNOSIS — H2513 Age-related nuclear cataract, bilateral: Secondary | ICD-10-CM | POA: Diagnosis not present

## 2023-05-05 DIAGNOSIS — H35033 Hypertensive retinopathy, bilateral: Secondary | ICD-10-CM | POA: Diagnosis not present

## 2023-05-05 DIAGNOSIS — E119 Type 2 diabetes mellitus without complications: Secondary | ICD-10-CM | POA: Diagnosis not present

## 2023-05-06 DIAGNOSIS — L409 Psoriasis, unspecified: Secondary | ICD-10-CM | POA: Diagnosis not present

## 2023-05-06 DIAGNOSIS — M1A00X0 Idiopathic chronic gout, unspecified site, without tophus (tophi): Secondary | ICD-10-CM | POA: Diagnosis not present

## 2023-05-06 DIAGNOSIS — Z79899 Other long term (current) drug therapy: Secondary | ICD-10-CM | POA: Diagnosis not present

## 2023-05-06 DIAGNOSIS — L4050 Arthropathic psoriasis, unspecified: Secondary | ICD-10-CM | POA: Diagnosis not present

## 2023-05-06 DIAGNOSIS — M150 Primary generalized (osteo)arthritis: Secondary | ICD-10-CM | POA: Diagnosis not present

## 2023-05-11 ENCOUNTER — Ambulatory Visit (INDEPENDENT_AMBULATORY_CARE_PROVIDER_SITE_OTHER): Payer: 59

## 2023-05-11 DIAGNOSIS — R002 Palpitations: Secondary | ICD-10-CM | POA: Diagnosis not present

## 2023-05-12 LAB — CUP PACEART REMOTE DEVICE CHECK
Date Time Interrogation Session: 20250316231217
Implantable Pulse Generator Implant Date: 20230629

## 2023-05-15 NOTE — Progress Notes (Signed)
 [facility] Summary Report / Loop Recorder

## 2023-05-20 ENCOUNTER — Inpatient Hospital Stay: Payer: 59 | Attending: Oncology

## 2023-05-20 ENCOUNTER — Inpatient Hospital Stay: Payer: 59

## 2023-05-20 DIAGNOSIS — N184 Chronic kidney disease, stage 4 (severe): Secondary | ICD-10-CM | POA: Diagnosis not present

## 2023-05-20 DIAGNOSIS — Z79899 Other long term (current) drug therapy: Secondary | ICD-10-CM | POA: Insufficient documentation

## 2023-05-20 DIAGNOSIS — I130 Hypertensive heart and chronic kidney disease with heart failure and stage 1 through stage 4 chronic kidney disease, or unspecified chronic kidney disease: Secondary | ICD-10-CM | POA: Diagnosis not present

## 2023-05-20 DIAGNOSIS — D631 Anemia in chronic kidney disease: Secondary | ICD-10-CM | POA: Insufficient documentation

## 2023-05-20 LAB — HEMOGLOBIN AND HEMATOCRIT (CANCER CENTER ONLY)
HCT: 33.2 % — ABNORMAL LOW (ref 36.0–46.0)
Hemoglobin: 10.7 g/dL — ABNORMAL LOW (ref 12.0–15.0)

## 2023-05-21 ENCOUNTER — Encounter: Payer: Self-pay | Admitting: Oncology

## 2023-05-21 NOTE — Progress Notes (Signed)
 Hgb 10.7 no retacrit

## 2023-05-29 ENCOUNTER — Emergency Department
Admission: EM | Admit: 2023-05-29 | Discharge: 2023-05-30 | Disposition: A | Discharge status: Home / Self Care | Attending: Emergency Medicine | Admitting: Emergency Medicine

## 2023-05-29 ENCOUNTER — Other Ambulatory Visit: Payer: Self-pay

## 2023-05-29 DIAGNOSIS — X102XXA Contact with fats and cooking oils, initial encounter: Secondary | ICD-10-CM | POA: Insufficient documentation

## 2023-05-29 DIAGNOSIS — T23002A Burn of unspecified degree of left hand, unspecified site, initial encounter: Secondary | ICD-10-CM | POA: Diagnosis present

## 2023-05-29 DIAGNOSIS — T23232A Burn of second degree of multiple left fingers (nail), not including thumb, initial encounter: Secondary | ICD-10-CM | POA: Diagnosis not present

## 2023-05-29 DIAGNOSIS — T310 Burns involving less than 10% of body surface: Secondary | ICD-10-CM | POA: Diagnosis not present

## 2023-05-29 DIAGNOSIS — T23272A Burn of second degree of left wrist, initial encounter: Secondary | ICD-10-CM | POA: Diagnosis not present

## 2023-05-29 DIAGNOSIS — T23202A Burn of second degree of left hand, unspecified site, initial encounter: Secondary | ICD-10-CM

## 2023-05-29 DIAGNOSIS — Z23 Encounter for immunization: Secondary | ICD-10-CM | POA: Insufficient documentation

## 2023-05-29 MED ORDER — MORPHINE SULFATE (PF) 4 MG/ML IV SOLN
4.0000 mg | Freq: Once | INTRAVENOUS | Status: AC
  Administered 2023-05-30: 4 mg via SUBCUTANEOUS
  Filled 2023-05-29: qty 1

## 2023-05-29 NOTE — ED Triage Notes (Signed)
 Pt reports hot grease from a pot spilled on her left hand. Pt has burn blisters to left hand and wrists. Pt talks in complete sentences no respiratory distress noted
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
## 2023-05-30 DIAGNOSIS — T23232A Burn of second degree of multiple left fingers (nail), not including thumb, initial encounter: Secondary | ICD-10-CM | POA: Diagnosis not present

## 2023-05-30 MED ORDER — TETANUS-DIPHTH-ACELL PERTUSSIS 5-2.5-18.5 LF-MCG/0.5 IM SUSY
0.5000 mL | PREFILLED_SYRINGE | Freq: Once | INTRAMUSCULAR | Status: AC
  Administered 2023-05-30: 0.5 mL via INTRAMUSCULAR
  Filled 2023-05-30: qty 0.5

## 2023-05-30 MED ORDER — SILVER SULFADIAZINE 1 % EX CREA
TOPICAL_CREAM | Freq: Once | CUTANEOUS | Status: AC
  Filled 2023-05-30: qty 85

## 2023-05-30 MED ORDER — SILVER SULFADIAZINE 1 % EX CREA: TOPICAL_CREAM | CUTANEOUS | 1 refills | Status: DC

## 2023-05-30 NOTE — Discharge Instructions (Addendum)
 Please keep your wounds clean and dry.  We recommend that you remove your gauze dressings twice daily and cleanse the wounds with a very gentle soap and cool water.  Do not scrub.  It is okay if the blisters break open and drain.  Once your burns are dry, reapply a thin layer of Silvadene cream and cover them back up with clean gauze.  **It is very important that you call the number provided for the [facility] outpatient burn clinic at 8 AM on Monday morning!!**You need to call them to schedule the next available follow-up appointment in [facility].  They should be able to get you into clinic relatively soon.  Continue to use your regular pain medication as prescribed by your doctor.  You can also take ibuprofen according to label instructions unless you have been told not to take ibuprofen in the past.  If you develop new or worsening symptoms that concern you, and you do not think it is time sensitive enough that you have to call EMS, please go directly to the [facility] emergency department, since you will then be able to be evaluated directly by a burn specialist.  If you feel that the issue is time sensitive, please return to the nearest emergency department including [facility].

## 2023-05-30 NOTE — ED Notes (Signed)
 Dressing applied over silvidine layer to left hand and arm.

## 2023-05-30 NOTE — ED Notes (Signed)
 Called [facility] for follow-up spoke with rep. Cordelia Pen, pt will be seen in outpatient clinic on Monday. Pt was not accepted [facility] has no beds per rep. Sherry.
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
## 2023-06-01 DIAGNOSIS — R7303 Prediabetes: Secondary | ICD-10-CM | POA: Diagnosis not present

## 2023-06-01 DIAGNOSIS — H2513 Age-related nuclear cataract, bilateral: Secondary | ICD-10-CM | POA: Diagnosis not present

## 2023-06-01 DIAGNOSIS — H25013 Cortical age-related cataract, bilateral: Secondary | ICD-10-CM | POA: Diagnosis not present

## 2023-06-02 ENCOUNTER — Other Ambulatory Visit: Payer: Self-pay

## 2023-06-02 ENCOUNTER — Encounter: Payer: Self-pay | Admitting: Emergency Medicine

## 2023-06-02 ENCOUNTER — Ambulatory Visit
Admission: EM | Admit: 2023-06-02 | Discharge: 2023-06-02 | Attending: Emergency Medicine | Admitting: Emergency Medicine

## 2023-06-02 DIAGNOSIS — T23232A Burn of second degree of multiple left fingers (nail), not including thumb, initial encounter: Secondary | ICD-10-CM | POA: Diagnosis not present

## 2023-06-02 DIAGNOSIS — R079 Chest pain, unspecified: Secondary | ICD-10-CM | POA: Diagnosis not present
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
## 2023-06-02 NOTE — ED Notes (Signed)
 Patient is being discharged from the Urgent Care and sent to the Emergency Department via POV . Per Dr.Mortenson, patient is in need of higher level of care due to chest pain & partial thickness burn. Patient is aware and verbalizes understanding of plan of care.  Vitals:   06/02/23 1803  BP: (!) 143/68  Pulse: (!) 59  Resp: 16  Temp: 98.5 F (36.9 C)  SpO2: 96%
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
## 2023-06-02 NOTE — Discharge Instructions (Signed)
 I am sending you to the [facility] emergency department for 2 reasons.  First, your chest pain.  I believe that you would benefit from a workup that is not available here in the urgent care.  Let them know if your chest pain changes or gets worse.  Second, your left hand burn.  It is getting worse, you are experiencing significant pain and swelling, and I am concerned that she may not be able to get time with follow-up as an outpatient.  You may benefit from evaluation by burn surgery while in the emergency department.

## 2023-06-03 DIAGNOSIS — T23202A Burn of second degree of left hand, unspecified site, initial encounter: Secondary | ICD-10-CM | POA: Diagnosis not present

## 2023-06-03 DIAGNOSIS — Z880 Allergy status to penicillin: Secondary | ICD-10-CM | POA: Diagnosis not present

## 2023-06-03 DIAGNOSIS — M109 Gout, unspecified: Secondary | ICD-10-CM | POA: Diagnosis not present

## 2023-06-03 DIAGNOSIS — M1990 Unspecified osteoarthritis, unspecified site: Secondary | ICD-10-CM | POA: Diagnosis not present

## 2023-06-03 DIAGNOSIS — T310 Burns involving less than 10% of body surface: Secondary | ICD-10-CM | POA: Diagnosis not present

## 2023-06-03 DIAGNOSIS — T23062A Burn of unspecified degree of back of left hand, initial encounter: Secondary | ICD-10-CM | POA: Diagnosis not present

## 2023-06-03 DIAGNOSIS — N189 Chronic kidney disease, unspecified: Secondary | ICD-10-CM | POA: Diagnosis not present

## 2023-06-03 DIAGNOSIS — Z881 Allergy status to other antibiotic agents status: Secondary | ICD-10-CM | POA: Diagnosis not present

## 2023-06-15 ENCOUNTER — Ambulatory Visit: Payer: 59

## 2023-06-15 DIAGNOSIS — R002 Palpitations: Secondary | ICD-10-CM | POA: Diagnosis not present

## 2023-06-15 LAB — CUP PACEART REMOTE DEVICE CHECK
Date Time Interrogation Session: 20250420231346
Implantable Pulse Generator Implant Date: 20230629

## 2023-06-16 DIAGNOSIS — I998 Other disorder of circulatory system: Secondary | ICD-10-CM | POA: Diagnosis not present

## 2023-06-16 DIAGNOSIS — T23292S Burn of second degree of multiple sites of left wrist and hand, sequela: Secondary | ICD-10-CM | POA: Diagnosis not present

## 2023-06-16 DIAGNOSIS — T23272S Burn of second degree of left wrist, sequela: Secondary | ICD-10-CM | POA: Diagnosis not present

## 2023-06-16 DIAGNOSIS — M25632 Stiffness of left wrist, not elsewhere classified: Secondary | ICD-10-CM | POA: Diagnosis not present

## 2023-06-16 DIAGNOSIS — T23292D Burn of second degree of multiple sites of left wrist and hand, subsequent encounter: Secondary | ICD-10-CM | POA: Diagnosis not present

## 2023-06-16 DIAGNOSIS — Z880 Allergy status to penicillin: Secondary | ICD-10-CM | POA: Diagnosis not present

## 2023-06-16 DIAGNOSIS — T23272D Burn of second degree of left wrist, subsequent encounter: Secondary | ICD-10-CM | POA: Diagnosis not present

## 2023-06-16 DIAGNOSIS — G8911 Acute pain due to trauma: Secondary | ICD-10-CM | POA: Diagnosis not present

## 2023-06-17 ENCOUNTER — Inpatient Hospital Stay: Payer: 59

## 2023-06-17 ENCOUNTER — Inpatient Hospital Stay: Payer: 59 | Attending: Oncology

## 2023-06-17 DIAGNOSIS — N1832 Chronic kidney disease, stage 3b: Secondary | ICD-10-CM | POA: Diagnosis not present

## 2023-06-17 DIAGNOSIS — D631 Anemia in chronic kidney disease: Secondary | ICD-10-CM | POA: Insufficient documentation

## 2023-06-17 LAB — HEMOGLOBIN AND HEMATOCRIT (CANCER CENTER ONLY)
HCT: 31.2 % — ABNORMAL LOW (ref 36.0–46.0)
Hemoglobin: 10.2 g/dL — ABNORMAL LOW (ref 12.0–15.0)

## 2023-06-17 NOTE — Progress Notes (Signed)
 Hgb 10.2; no retacrit 

## 2023-06-30 NOTE — Progress Notes (Signed)
 [facility] Summary Report / Loop Recorder

## 2023-06-30 NOTE — Addendum Note (Signed)
 Addended by: Edra Govern D on: 06/30/2023 12:27 PM   Modules accepted: Orders

## 2023-07-09 ENCOUNTER — Ambulatory Visit: Payer: 59 | Attending: Student in an Organized Health Care Education/Training Program | Admitting: Nurse Practitioner

## 2023-07-09 ENCOUNTER — Encounter: Payer: Self-pay | Admitting: Nurse Practitioner

## 2023-07-09 VITALS — BP 125/107 | HR 75 | Temp 98.6°F | Resp 17 | Ht 71.0 in | Wt 259.0 lb

## 2023-07-09 DIAGNOSIS — M1612 Unilateral primary osteoarthritis, left hip: Secondary | ICD-10-CM | POA: Diagnosis not present

## 2023-07-09 DIAGNOSIS — M47816 Spondylosis without myelopathy or radiculopathy, lumbar region: Secondary | ICD-10-CM | POA: Diagnosis not present

## 2023-07-09 DIAGNOSIS — M25562 Pain in left knee: Secondary | ICD-10-CM | POA: Diagnosis not present

## 2023-07-09 DIAGNOSIS — G894 Chronic pain syndrome: Secondary | ICD-10-CM | POA: Insufficient documentation

## 2023-07-09 DIAGNOSIS — G5702 Lesion of sciatic nerve, left lower limb: Secondary | ICD-10-CM | POA: Insufficient documentation

## 2023-07-09 DIAGNOSIS — Z79899 Other long term (current) drug therapy: Secondary | ICD-10-CM | POA: Diagnosis not present

## 2023-07-09 DIAGNOSIS — M47812 Spondylosis without myelopathy or radiculopathy, cervical region: Secondary | ICD-10-CM | POA: Diagnosis not present

## 2023-07-09 DIAGNOSIS — Z96652 Presence of left artificial knee joint: Secondary | ICD-10-CM | POA: Insufficient documentation

## 2023-07-09 DIAGNOSIS — T300 Burn of unspecified body region, unspecified degree: Secondary | ICD-10-CM | POA: Insufficient documentation

## 2023-07-09 DIAGNOSIS — M5416 Radiculopathy, lumbar region: Secondary | ICD-10-CM | POA: Diagnosis not present

## 2023-07-09 DIAGNOSIS — G8929 Other chronic pain: Secondary | ICD-10-CM | POA: Insufficient documentation

## 2023-07-09 DIAGNOSIS — M4726 Other spondylosis with radiculopathy, lumbar region: Secondary | ICD-10-CM | POA: Diagnosis not present

## 2023-07-09 MED ORDER — HYDROCODONE-ACETAMINOPHEN 7.5-325 MG PO TABS: 1.0000 | ORAL_TABLET | Freq: Three times a day (TID) | ORAL | 0 refills | Status: AC | PRN

## 2023-07-09 MED ORDER — HYDROCODONE-ACETAMINOPHEN 7.5-325 MG PO TABS: 1.0000 | ORAL_TABLET | Freq: Three times a day (TID) | ORAL | 0 refills | Status: DC | PRN

## 2023-07-09 NOTE — Progress Notes (Signed)
 PROVIDER NOTE: Interpretation of information contained herein should be left to medically-trained personnel. Specific patient instructions are provided elsewhere under "Patient Instructions" section of medical record. This document was created in part using AI and STT-dictation technology, any transcriptional errors that may result from this process are unintentional.  Patient: Michele Moore  Service: E/M   PCP: Clarise Crooks, MD  DOB: 04-29-1950  DOS: 07/09/2023  Provider: Cherylin Corrigan, NP  MRN: [id number]  Delivery: Face-to-face  Specialty: Interventional Pain Management  Type: Established Patient  Setting: Ambulatory outpatient facility  Specialty designation: 09  Referring Prov.: Clarise Crooks, MD  Location: Outpatient office facility       HPI  [name], a 73 y.o. year old female, is here today because of her Lumbar spondylosis [M47.816]. [name] primary complain today is Back Pain (lower), Hip Pain (left), Shoulder Pain (right), and Knee Pain (left)   Pain Assessment: Severity of Chronic pain is reported as a 10-Worst pain ever/10. Location: Back Lower/to left hip. Onset: More than a month ago. Quality:  . Timing: Constant. Modifying factor(s): meds. Vitals:  height is 5\' 11"  (1.803 m) and weight is 259 lb (117.5 kg). Her temperature is 98.6 F (37 C). Her blood pressure is 125/107 (abnormal) and her pulse is 75. Her respiration is 17 and oxygen saturation is 98%.  BMI: Estimated body mass index is 36.12 kg/m as calculated from the following:   Height as of this encounter: 5\' 11"  (1.803 m).   Weight as of this encounter: 259 lb (117.5 kg). Last encounter: 04/14/2023.  Reason for encounter: medication management. No change in medical history since last visit.  Patient's pain is at baseline.  Patient continues multimodal pain regimen as prescribed.  States that it provides pain relief and improvement in functional status.  Pharmacotherapy Assessment  Analgesic:  Hydrocodone -acetaminophen  (Norco) 7.5-325 mg every 8 hours as needed for pain. MME=22.50  Monitoring: [place] PMP: PDMP reviewed during this encounter.       Pharmacotherapy: No side-effects or adverse reactions reported. Compliance: No problems identified. Effectiveness: Clinically acceptable.  Lennis Rabon, RN  07/09/2023  3:03 PM  Sign when Signing Visit Nursing Pain Medication Assessment:  Safety precautions to be maintained throughout the outpatient stay will include: orient to surroundings, keep bed in low position, maintain call bell within reach at all times, provide assistance with transfer out of bed and ambulation.  Medication Inspection Compliance: Pill count conducted under aseptic conditions, in front of the patient. Neither the pills nor the bottle was removed from the patient's sight at any time. Once count was completed pills were immediately returned to the patient in their original bottle.  Medication: Hydrocodone /APAP Pill/Patch Count: 30 of 90 pills/patches remain Pill/Patch Appearance: Markings consistent with prescribed medication Bottle Appearance: Standard pharmacy container. Clearly labeled. Filled Date: 4 / 37 / 2025 Last Medication intake:  Today    No results found for: "CBDTHCR" No results found for: "D8THCCBX" No results found for: "D9THCCBX"  UDS:  Summary  Date Value Ref Range Status  07/17/2022 Note  Final    Comment:    ==================================================================== [employer]ox[employer] Select 13 (MW) ==================================================================== Test                             Result       Flag       Units  Drug Present and Declared for Prescription Verification   Hydrocodone   741          EXPECTED   ng/mg creat   Hydromorphone                   182          EXPECTED   ng/mg creat   Norhydrocodone                 339          EXPECTED   ng/mg creat    Sources of hydrocodone  include scheduled  prescription medications.    Hydromorphone  and norhydrocodone are expected metabolites of    hydrocodone . Hydromorphone  is also available as a scheduled    prescription medication.  ==================================================================== Test                      Result    Flag   Units      Ref Range   Creatinine              51               mg/dL      >=44 ==================================================================== Declared Medications:  The flagging and interpretation on this report are based on the  following declared medications.  Unexpected results may arise from  inaccuracies in the declared medications.   **Note: The testing scope of this panel includes these medications:   Hydrocodone  (Norco)   **Note: The testing scope of this panel does not include the  following reported medications:   Acetaminophen  (Tylenol )  Acetaminophen  (Norco)  Albuterol  (Ventolin  HFA)  Allopurinol  (Zyloprim )  Apremilast  (Otezla )  Betamethasone   Calcipotriene   Calcium   Clobetasol  (Temovate )  Dapagliflozin  (Farxiga )  Fluticasone  (Flonase )  Furosemide  (Lasix )  Gabapentin  (Neurontin )  Ketoconazole  (Nizoral )  Levothyroxine  (Synthroid )  Losartan  (Cozaar )  Meclizine  (Antivert )  Metoprolol  (Lopressor )  Mometasone  (Nasonex )  Montelukast  (Singulair )  Multivitamin  Mupirocin  (Bactroban )  Nystatin  (Mycostatin )  Omega-3 Fatty Acids  Omeprazole  (Prilosec)  Polyethylene Glycol  Potassium (Klor-Con )  Tizanidine  (Zanaflex )  Topical Diclofenac (Voltaren)  Triamcinolone  (Kenalog )  Vitamin D3 ==================================================================== For clinical consultation, please call [phone]. ====================================================================       ROS  Constitutional: Denies any fever or chills Gastrointestinal: No reported hemesis, hematochezia, vomiting, or acute GI distress Musculoskeletal: lower back pain, left knee pain,  right shoulder pain, left hip pain  Neurological: No reported episodes of acute onset apraxia, aphasia, dysarthria, agnosia, amnesia, paralysis, loss of coordination, or loss of consciousness  Medication Review  Apremilast , Dupilumab, HYDROcodone -acetaminophen , Multiple Vitamins-Calcium , Polyethyl Glycol-Propyl Glycol, Upadacitinib, Vitamin D3, acetaminophen , albuterol , allopurinol , betamethasone  dipropionate, calcipotriene , cetirizine, clobetasol  ointment, dapagliflozin  propanediol, diclofenac sodium, empagliflozin, erythromycin , fluticasone , furosemide , gabapentin , ketoconazole , levothyroxine , losartan , meclizine , metoprolol  tartrate, mometasone , montelukast , mupirocin  ointment, nystatin , nystatin  cream, omega-3 acid ethyl esters, omeprazole , potassium chloride  SA, predniSONE , tiZANidine , and triamcinolone  cream  History Review  Allergy: Ms. Cowell is allergic to ampicillin, penicillins, vancomycin, and vibramycin  [doxycycline  calcium ]. Drug: Ms. Quintal  reports no history of drug use. Alcohol :  reports no history of alcohol  use. Tobacco:  reports that she quit smoking about 30 years ago. Her smoking use included cigarettes. She started smoking about 50 years ago. She has a 40 pack-year smoking history. She has never used smokeless tobacco. Social: Ms. Pethel  reports that she quit smoking about 30 years ago. Her smoking use included cigarettes. She started smoking about 50 years ago. She has a 40 pack-year smoking history. She has never used smokeless tobacco. She reports that she does not drink alcohol  and  does not use drugs. Medical:  has a past medical history of (HFpEF) heart failure with preserved ejection fraction (HCC) (01/13/2014), Allergy, ANA positive, Anemia of chronic renal failure, stage 3 (moderate), unspecified whether stage 3a or 3b CKD (HCC), Aortic atherosclerosis (HCC), Asthma, Burn, Cataract, Chronic pain syndrome, Chronic, continuous use of opioids, CKD (chronic kidney disease), stage  III (HCC), Complication of anesthesia, DDD (degenerative disc disease), lumbar, Dyspnea on exertion, Dysrhythmia, Family history of adverse reaction to anesthesia, GERD (gastroesophageal reflux disease), Glaucoma, Gout, H/O tooth extraction, Hemorrhoid, History of hiatal hernia, History of kidney stones, Hypertension, Hypothyroidism, Implantable loop recorder present (08/22/2021), Long term current use of immunosuppressive drug, Migraines, Mixed hyperlipidemia, Multiple gastric ulcers, Osteoarthritis, Psoriasis, Psoriatic arthritis (HCC), Rheumatoid arthritis (HCC), SS-A antibody positive, and Wears dentures. Surgical: Ms. Crecelius  has a past surgical history that includes Carpal tunnel release; Rotator cuff repair (Right, 1995); Ankle surgery (Right); Tubal ligation; Colonoscopy (2000?); Upper gi endoscopy (2000?); Tonsillectomy; Fusion of talonavicular joint (Right, 08/03/2015); Colonoscopy with propofol  (N/A, 10/22/2017); Esophagogastroduodenoscopy (egd) with propofol  (N/A, 10/22/2017); polypectomy (N/A, 10/22/2017); Total knee revision (Right, 01/20/2018); Joint replacement (Right); Loop recorder implant (08/22/2021); Total knee revision (Left, 10/09/2021); Colonoscopy with propofol  (N/A, 08/14/2022); Esophagogastroduodenoscopy (egd) with propofol  (N/A, 08/14/2022); Hot hemostasis (08/14/2022); and polypectomy (08/14/2022). Family: family history includes Arthritis in her mother; COPD in her sister; Cancer in her maternal aunt and maternal uncle; Diabetes in her father; Gout in her mother; Heart failure in her father and mother; Hyperlipidemia in her father; Hypertension in her mother; Stroke in her mother.  Laboratory Chemistry Profile   Renal Lab Results  Component Value Date   BUN 25 04/02/2023   CREATININE 2.06 (H) 04/02/2023   BCR 12 04/02/2023   GFRAA 30 (L) 08/28/2019   GFRNONAA 24 (L) 03/14/2023    Hepatic Lab Results  Component Value Date   AST 29 04/02/2023   ALT 14 04/02/2023   ALBUMIN 4.3  04/02/2023   ALKPHOS 104 04/02/2023    Electrolytes Lab Results  Component Value Date   NA 144 04/02/2023   K 4.4 04/02/2023   CL 104 04/02/2023   CALCIUM  9.8 04/02/2023   MG 2.1 10/26/2022   PHOS 3.0 09/25/2022    Bone No results found for: "VD25OH", "VD125OH2TOT", "VW0981XB1", "YN8295AO1", "25OHVITD1", "25OHVITD2", "25OHVITD3", "TESTOFREE", "TESTOSTERONE"  Inflammation (CRP: Acute Phase) (ESR: Chronic Phase) Lab Results  Component Value Date   CRP 1.6 (H) 09/27/2021   ESRSEDRATE 79 (H) 09/27/2021         Note: Above Lab results reviewed.  Recent Imaging Review  CUP PACEART REMOTE DEVICE CHECK ILR summary report received. Battery status OK. Normal device function. No new symptom, tachy, brady, or pause episodes. No new AF episodes; cardiac compass c/w up to 12 min of AF per day and not meeting detection criteria of 6 min. Monthly summary  reports and ROV/PRN. MC, CVRS Note: Reviewed       CLINICAL DATA:  Pain in back, neck, left ribs, and ankle after a fall   EXAM: CT HEAD WITHOUT CONTRAST   CT CERVICAL SPINE WITHOUT CONTRAST   TECHNIQUE: Multidetector CT imaging of the head and cervical spine was performed following the standard protocol without intravenous contrast. Multiplanar CT image reconstructions of the cervical spine were also generated.   RADIATION DOSE REDUCTION: This exam was performed according to the departmental dose-optimization program which includes automated exposure control, adjustment of the mA and/or kV according to patient size and/or use of iterative reconstruction technique.   COMPARISON:  CT abdomen cervical spine 05/13/2017   FINDINGS: CT HEAD FINDINGS   Brain: No intracranial hemorrhage, mass effect, or evidence of acute infarct. No hydrocephalus. No extra-axial fluid collection. Mild cerebral atrophy and chronic small vessel ischemic disease.   Vascular: No hyperdense vessel. Intracranial arterial calcification.   Skull: No  fracture or focal lesion.   Sinuses/Orbits: No acute finding.   Other: None.   CT CERVICAL SPINE FINDINGS   Alignment: No evidence of traumatic malalignment. Grade 1 anterolisthesis of C3 and C4 has increased from 2019 and is favored chronic.   Skull base and vertebrae: No acute fracture. No primary bone lesion or focal pathologic process.   Soft tissues and spinal canal: No prevertebral fluid or swelling. No visible canal hematoma.   Disc levels: Mild multilevel spondylosis, disc space height loss, and degenerative endplate changes. Multilevel facet arthropathy. No severe spinal canal narrowing.   Upper chest: No acute abnormality.   Other: None.   IMPRESSION: 1. No acute intracranial abnormality. 2. No acute fracture in the cervical spine.     Electronically Signed   By: Rozell Cornet M.D.   On: 03/14/2023 22:11  Physical Exam  General appearance: Well nourished, well developed, and well hydrated. In no apparent acute distress Mental status: Alert, oriented x 3 (person, place, & time)       Respiratory: No evidence of acute respiratory distress Eyes: PERLA Vitals: BP (!) 125/107   Pulse 75   Temp 98.6 F (37 C)   Resp 17   Ht 5\' 11"  (1.803 m)   Wt 259 lb (117.5 kg)   SpO2 98%   BMI 36.12 kg/m  BMI: Estimated body mass index is 36.12 kg/m as calculated from the following:   Height as of this encounter: 5\' 11"  (1.803 m).   Weight as of this encounter: 259 lb (117.5 kg). Ideal: Ideal body weight: 70.8 kg (156 lb 1.4 oz) Adjusted ideal body weight: 89.5 kg (197 lb 4 oz)  Assessment   Diagnosis Status  1. Lumbar spondylosis   2. Primary osteoarthritis of left hip   3. Piriformis syndrome of left side   4. Chronic pain syndrome   5. Cervical spondylosis without myelopathy   6. Chronic knee pain after total replacement of left knee joint   7. Lumbar radiculopathy   8. Medication management    Controlled Controlled Controlled   Plan of Care   Assessment and Plan We will continue on current medication.  Prescription drug monitoring (PDMP) consistent with prescribed medication. Routine UDS ordered today.  No other new issues or problems reported to this visit.    Pharmacotherapy (Medications Ordered): Meds ordered this encounter  Medications   HYDROcodone -acetaminophen  (NORCO) 7.5-325 MG tablet    Sig: Take 1 tablet by mouth every 8 (eight) hours as needed for severe pain (pain score 7-10). Must last 30 days.    Dispense:  90 tablet    Refill:  0    Chronic Pain: STOP Act (Not applicable) Fill 1 day early if closed on refill date. Avoid benzodiazepines within 8 hours of opioids   HYDROcodone -acetaminophen  (NORCO) 7.5-325 MG tablet    Sig: Take 1 tablet by mouth every 8 (eight) hours as needed for severe pain (pain score 7-10). Must last 30 days.    Dispense:  90 tablet    Refill:  0    Chronic Pain: STOP Act (Not applicable) Fill 1 day early if closed on refill date. Avoid benzodiazepines within 8 hours of opioids  HYDROcodone -acetaminophen  (NORCO) 7.5-325 MG tablet    Sig: Take 1 tablet by mouth every 8 (eight) hours as needed for severe pain (pain score 7-10). Must last 30 days.    Dispense:  90 tablet    Refill:  0    Chronic Pain: STOP Act (Not applicable) Fill 1 day early if closed on refill date. Avoid benzodiazepines within 8 hours of opioids   Orders:  Orders Placed This Encounter  Procedures   ToxASSU[employer] Select 13 (MW), Urine    Volume: 30 ml(s). Minimum 3 ml of urine is needed. Document temperature of fresh sample. Indications: Long term (current) use of opiate analgesic (Z61.096)    Release to patient:   Immediate   Follow-up plan:   Return in about 3 months (around 10/09/2023) for (F2F), (MM), Marthe Slain NP.    Recent Visits Date Type Provider Dept  04/14/23 Office Visit Cephus Collin, MD [facility]  Showing recent visits within past 90 days and meeting all other requirements Today's  Visits Date Type Provider Dept  07/09/23 Office Visit Kairo Laubacher K, NP [facility]  Showing today's visits and meeting all other requirements Future Appointments No visits were found meeting these conditions. Showing future appointments within next 90 days and meeting all other requirements  I discussed the assessment and treatment plan with the patient. The patient was provided an opportunity to ask questions and all were answered. The patient agreed with the plan and demonstrated an understanding of the instructions.  Patient advised to call back or seek an in-person evaluation if the symptoms or condition worsens.  Duration of encounter: 30 minutes.  Total time on encounter, as per AMA guidelines included both the face-to-face and non-face-to-face time personally spent by the physician and/or other qualified health care professional(s) on the day of the encounter (includes time in activities that require the physician or other qualified health care professional and does not include time in activities normally performed by clinical staff). Physician's time may include the following activities when performed: Preparing to see the patient (e.g., pre-charting review of records, searching for previously ordered imaging, lab work, and nerve conduction tests) Review of prior analgesic pharmacotherapies. Reviewing PMP Interpreting ordered tests (e.g., lab work, imaging, nerve conduction tests) Performing post-procedure evaluations, including interpretation of diagnostic procedures Obtaining and/or reviewing separately obtained history Performing a medically appropriate examination and/or evaluation Counseling and educating the patient/family/caregiver Ordering medications, tests, or procedures Referring and communicating with other health care professionals (when not separately reported) Documenting clinical information in the electronic or other health record Independently  interpreting results (not separately reported) and communicating results to the patient/ family/caregiver Care coordination (not separately reported)  Note by: Lovada Barwick K Lakyla Biswas, NP (TTS and AI technology used. I apologize for any typographical errors that were not detected and corrected.) Date: 07/09/2023; Time: 3:26 PM

## 2023-07-09 NOTE — Progress Notes (Signed)
 Nursing Pain Medication Assessment:  Safety precautions to be maintained throughout the outpatient stay will include: orient to surroundings, keep bed in low position, maintain call bell within reach at all times, provide assistance with transfer out of bed and ambulation.  Medication Inspection Compliance: Pill count conducted under aseptic conditions, in front of the patient. Neither the pills nor the bottle was removed from the patient's sight at any time. Once count was completed pills were immediately returned to the patient in their original bottle.  Medication: Hydrocodone /APAP Pill/Patch Count: 30 of 90 pills/patches remain Pill/Patch Appearance: Markings consistent with prescribed medication Bottle Appearance: Standard pharmacy container. Clearly labeled. Filled Date: 4 / 38 / 2025 Last Medication intake:  Today

## 2023-07-13 DIAGNOSIS — B372 Candidiasis of skin and nail: Secondary | ICD-10-CM | POA: Diagnosis not present

## 2023-07-14 ENCOUNTER — Inpatient Hospital Stay: Payer: 59 | Attending: Oncology

## 2023-07-14 DIAGNOSIS — N1832 Chronic kidney disease, stage 3b: Secondary | ICD-10-CM | POA: Diagnosis not present

## 2023-07-14 DIAGNOSIS — D631 Anemia in chronic kidney disease: Secondary | ICD-10-CM | POA: Insufficient documentation

## 2023-07-14 LAB — CBC WITH DIFFERENTIAL (CANCER CENTER ONLY)
Abs Immature Granulocytes: 0.02 10*3/uL (ref 0.00–0.07)
Basophils Absolute: 0 10*3/uL (ref 0.0–0.1)
Basophils Relative: 0 %
Eosinophils Absolute: 0.1 10*3/uL (ref 0.0–0.5)
Eosinophils Relative: 1 %
HCT: 31.7 % — ABNORMAL LOW (ref 36.0–46.0)
Hemoglobin: 10.3 g/dL — ABNORMAL LOW (ref 12.0–15.0)
Immature Granulocytes: 0 %
Lymphocytes Relative: 34 %
Lymphs Abs: 1.8 10*3/uL (ref 0.7–4.0)
MCH: 29.6 pg (ref 26.0–34.0)
MCHC: 32.5 g/dL (ref 30.0–36.0)
MCV: 91.1 fL (ref 80.0–100.0)
Monocytes Absolute: 0.5 10*3/uL (ref 0.1–1.0)
Monocytes Relative: 10 %
Neutro Abs: 2.9 10*3/uL (ref 1.7–7.7)
Neutrophils Relative %: 55 %
Platelet Count: 185 10*3/uL (ref 150–400)
RBC: 3.48 MIL/uL — ABNORMAL LOW (ref 3.87–5.11)
RDW: 14.5 % (ref 11.5–15.5)
WBC Count: 5.3 10*3/uL (ref 4.0–10.5)
nRBC: 0 % (ref 0.0–0.2)

## 2023-07-14 LAB — RETIC PANEL
Immature Retic Fract: 15.6 % (ref 2.3–15.9)
RBC.: 3.44 MIL/uL — ABNORMAL LOW (ref 3.87–5.11)
Retic Count, Absolute: 60.2 10*3/uL (ref 19.0–186.0)
Retic Ct Pct: 1.8 % (ref 0.4–3.1)
Reticulocyte Hemoglobin: 30.6 pg (ref >27.9)

## 2023-07-14 LAB — IRON AND TIBC
Iron: 81 ug/dL (ref 28–170)
Saturation Ratios: 27 % (ref 10.4–31.8)
TIBC: 302 ug/dL (ref 250–450)
UIBC: 221 ug/dL

## 2023-07-14 LAB — FERRITIN: Ferritin: 79 ng/mL (ref 11–307)

## 2023-07-15 ENCOUNTER — Inpatient Hospital Stay (HOSPITAL_BASED_OUTPATIENT_CLINIC_OR_DEPARTMENT_OTHER): Payer: 59 | Admitting: Oncology

## 2023-07-15 ENCOUNTER — Inpatient Hospital Stay: Payer: 59

## 2023-07-15 ENCOUNTER — Encounter: Payer: Self-pay | Admitting: Oncology

## 2023-07-15 VITALS — BP 152/80 | HR 52 | Resp 18

## 2023-07-15 VITALS — BP 118/79 | HR 60 | Temp 96.8°F | Resp 18 | Wt 275.8 lb

## 2023-07-15 DIAGNOSIS — Z0000 Encounter for general adult medical examination without abnormal findings: Secondary | ICD-10-CM | POA: Insufficient documentation

## 2023-07-15 DIAGNOSIS — D631 Anemia in chronic kidney disease: Secondary | ICD-10-CM

## 2023-07-15 DIAGNOSIS — D649 Anemia, unspecified: Secondary | ICD-10-CM

## 2023-07-15 DIAGNOSIS — N183 Chronic kidney disease, stage 3 unspecified: Secondary | ICD-10-CM

## 2023-07-15 DIAGNOSIS — B372 Candidiasis of skin and nail: Secondary | ICD-10-CM | POA: Insufficient documentation

## 2023-07-15 DIAGNOSIS — M7989 Other specified soft tissue disorders: Secondary | ICD-10-CM | POA: Insufficient documentation

## 2023-07-15 DIAGNOSIS — N1832 Chronic kidney disease, stage 3b: Secondary | ICD-10-CM

## 2023-07-15 DIAGNOSIS — Z1231 Encounter for screening mammogram for malignant neoplasm of breast: Secondary | ICD-10-CM

## 2023-07-15 MED ORDER — IRON SUCROSE 20 MG/ML IV SOLN
200.0000 mg | Freq: Once | INTRAVENOUS | Status: AC
  Administered 2023-07-15: 200 mg via INTRAVENOUS
  Filled 2023-07-15: qty 10
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
## 2023-07-15 NOTE — Assessment & Plan Note (Signed)
 Finish course of fluconazole.

## 2023-07-15 NOTE — Assessment & Plan Note (Signed)
 Patient is on erythropoietin replacement.  I will obtain ultrasound to rule out DVT.

## 2023-07-15 NOTE — Progress Notes (Signed)
 Pt here for follow up. Reports that she has been having some swelling to right leg. She is also on Flucanazole for yeast infection

## 2023-07-15 NOTE — Assessment & Plan Note (Addendum)
 Labs reviewed and discussed with patient.  Hemoglobin remains low despite Venofer  and EPO.   Lab Results  Component Value Date   HGB 10.3 (L) 07/14/2023   TIBC 302 07/14/2023   IRONPCTSAT 27 07/14/2023   FERRITIN 79 07/14/2023   No need for Retacrit  today Ferritin <200, recommend Venofer  treatments to further increase iron  level.  . Recommend IV Venofer  weekly x2 After that we will monitor H&H every 4 weeks, proceed with Retacrit  if hemoglobin less than 10.

## 2023-07-15 NOTE — Assessment & Plan Note (Signed)
 Encourage oral hydration and avoid nephrotoxins.

## 2023-07-15 NOTE — Assessment & Plan Note (Signed)
 She is overdue for mammogram.  Will obtain screening mammogram bilaterally.

## 2023-07-17 LAB — TOXASSURE SELECT 13 (MW), URINE

## 2023-07-21 ENCOUNTER — Ambulatory Visit: Payer: 59

## 2023-07-21 ENCOUNTER — Ambulatory Visit
Admission: RE | Admit: 2023-07-21 | Discharge: 2023-07-21 | Disposition: A | Discharge status: Home / Self Care | Source: Ambulatory Visit | Attending: Oncology | Admitting: Oncology

## 2023-07-21 DIAGNOSIS — M7989 Other specified soft tissue disorders: Secondary | ICD-10-CM | POA: Insufficient documentation

## 2023-07-21 DIAGNOSIS — I5032 Chronic diastolic (congestive) heart failure: Secondary | ICD-10-CM

## 2023-07-21 DIAGNOSIS — R002 Palpitations: Secondary | ICD-10-CM

## 2023-07-22 ENCOUNTER — Inpatient Hospital Stay

## 2023-07-22 ENCOUNTER — Ambulatory Visit: Payer: Self-pay | Admitting: Cardiology

## 2023-07-22 ENCOUNTER — Ambulatory Visit

## 2023-07-22 LAB — CUP PACEART REMOTE DEVICE CHECK
Date Time Interrogation Session: 20250526231906
Implantable Pulse Generator Implant Date: 20230629

## 2023-07-23 ENCOUNTER — Other Ambulatory Visit: Payer: Self-pay | Admitting: Student in an Organized Health Care Education/Training Program

## 2023-07-23 DIAGNOSIS — M47812 Spondylosis without myelopathy or radiculopathy, cervical region: Secondary | ICD-10-CM

## 2023-07-23 DIAGNOSIS — M47816 Spondylosis without myelopathy or radiculopathy, lumbar region: Secondary | ICD-10-CM

## 2023-07-23 DIAGNOSIS — G894 Chronic pain syndrome: Secondary | ICD-10-CM

## 2023-07-24 ENCOUNTER — Other Ambulatory Visit: Payer: Self-pay | Admitting: Family Medicine

## 2023-07-24 DIAGNOSIS — M25571 Pain in right ankle and joints of right foot: Secondary | ICD-10-CM | POA: Diagnosis not present

## 2023-07-24 DIAGNOSIS — S92131K Displaced fracture of posterior process of right talus, subsequent encounter for fracture with nonunion: Secondary | ICD-10-CM | POA: Diagnosis not present

## 2023-07-24 DIAGNOSIS — M960 Pseudarthrosis after fusion or arthrodesis: Secondary | ICD-10-CM | POA: Diagnosis not present

## 2023-07-24 DIAGNOSIS — M19071 Primary osteoarthritis, right ankle and foot: Secondary | ICD-10-CM | POA: Diagnosis not present

## 2023-07-24 DIAGNOSIS — M069 Rheumatoid arthritis, unspecified: Secondary | ICD-10-CM | POA: Diagnosis not present

## 2023-08-05 NOTE — Progress Notes (Signed)
 [facility] Summary Report / Loop Recorder

## 2023-08-06 ENCOUNTER — Ambulatory Visit
Admission: RE | Admit: 2023-08-06 | Discharge: 2023-08-06 | Disposition: A | Discharge status: Home / Self Care | Source: Ambulatory Visit | Attending: Oncology | Admitting: Oncology

## 2023-08-06 DIAGNOSIS — Z1231 Encounter for screening mammogram for malignant neoplasm of breast: Secondary | ICD-10-CM | POA: Diagnosis not present

## 2023-08-10 ENCOUNTER — Other Ambulatory Visit: Payer: Self-pay

## 2023-08-10 MED ORDER — MONTELUKAST SODIUM 10 MG PO TABS: ORAL_TABLET | ORAL | 0 refills | Status: DC

## 2023-08-12 ENCOUNTER — Inpatient Hospital Stay: Attending: Oncology

## 2023-08-12 ENCOUNTER — Inpatient Hospital Stay

## 2023-08-12 DIAGNOSIS — D631 Anemia in chronic kidney disease: Secondary | ICD-10-CM | POA: Diagnosis not present

## 2023-08-12 DIAGNOSIS — N1832 Chronic kidney disease, stage 3b: Secondary | ICD-10-CM | POA: Diagnosis not present

## 2023-08-12 LAB — HEMOGLOBIN AND HEMATOCRIT (CANCER CENTER ONLY)
HCT: 32.9 % — ABNORMAL LOW (ref 36.0–46.0)
Hemoglobin: 10.6 g/dL — ABNORMAL LOW (ref 12.0–15.0)

## 2023-08-12 NOTE — Progress Notes (Signed)
 Hgb 10.6 today, no inj given

## 2023-08-18 DIAGNOSIS — Z96653 Presence of artificial knee joint, bilateral: Secondary | ICD-10-CM | POA: Diagnosis not present

## 2023-08-20 ENCOUNTER — Ambulatory Visit

## 2023-08-20 DIAGNOSIS — I5032 Chronic diastolic (congestive) heart failure: Secondary | ICD-10-CM | POA: Diagnosis not present

## 2023-08-20 LAB — CUP PACEART REMOTE DEVICE CHECK
Date Time Interrogation Session: 20250625232015
Implantable Pulse Generator Implant Date: 20230629

## 2023-08-23 ENCOUNTER — Ambulatory Visit: Payer: Self-pay | Admitting: Cardiology

## 2023-08-24 DIAGNOSIS — N2581 Secondary hyperparathyroidism of renal origin: Secondary | ICD-10-CM | POA: Diagnosis not present

## 2023-08-24 DIAGNOSIS — N184 Chronic kidney disease, stage 4 (severe): Secondary | ICD-10-CM | POA: Diagnosis not present

## 2023-08-24 DIAGNOSIS — D631 Anemia in chronic kidney disease: Secondary | ICD-10-CM | POA: Diagnosis not present

## 2023-08-24 DIAGNOSIS — I10 Essential (primary) hypertension: Secondary | ICD-10-CM | POA: Diagnosis not present

## 2023-09-09 ENCOUNTER — Inpatient Hospital Stay: Attending: Oncology

## 2023-09-09 ENCOUNTER — Inpatient Hospital Stay

## 2023-09-09 DIAGNOSIS — N1832 Chronic kidney disease, stage 3b: Secondary | ICD-10-CM | POA: Diagnosis present

## 2023-09-09 DIAGNOSIS — D631 Anemia in chronic kidney disease: Secondary | ICD-10-CM | POA: Diagnosis present

## 2023-09-09 LAB — HEMOGLOBIN AND HEMATOCRIT (CANCER CENTER ONLY)
HCT: 31.3 % — ABNORMAL LOW (ref 36.0–46.0)
Hemoglobin: 10.2 g/dL — ABNORMAL LOW (ref 12.0–15.0)

## 2023-09-09 NOTE — Progress Notes (Signed)
 Per Dr. Babara team, patient does not need inj today, hbg is 10.2.

## 2023-09-11 NOTE — Progress Notes (Signed)
 [facility] Summary Report / Loop Recorder

## 2023-09-11 NOTE — Addendum Note (Signed)
 Addended by: TAWNI DRILLING D on: 09/11/2023 12:49 PM   Modules accepted: Orders

## 2023-09-20 ENCOUNTER — Other Ambulatory Visit: Payer: Self-pay

## 2023-09-20 ENCOUNTER — Emergency Department

## 2023-09-20 ENCOUNTER — Inpatient Hospital Stay
Admission: EM | Admit: 2023-09-20 | Discharge: 2023-09-25 | DRG: 291 | Disposition: A | Discharge status: Home / Self Care | Attending: Internal Medicine | Admitting: Internal Medicine

## 2023-09-20 DIAGNOSIS — Z79899 Other long term (current) drug therapy: Secondary | ICD-10-CM

## 2023-09-20 DIAGNOSIS — G894 Chronic pain syndrome: Secondary | ICD-10-CM | POA: Diagnosis present

## 2023-09-20 DIAGNOSIS — D631 Anemia in chronic kidney disease: Secondary | ICD-10-CM | POA: Diagnosis present

## 2023-09-20 DIAGNOSIS — Z87891 Personal history of nicotine dependence: Secondary | ICD-10-CM

## 2023-09-20 DIAGNOSIS — Z803 Family history of malignant neoplasm of breast: Secondary | ICD-10-CM

## 2023-09-20 DIAGNOSIS — Z881 Allergy status to other antibiotic agents status: Secondary | ICD-10-CM

## 2023-09-20 DIAGNOSIS — Z8249 Family history of ischemic heart disease and other diseases of the circulatory system: Secondary | ICD-10-CM

## 2023-09-20 DIAGNOSIS — J45909 Unspecified asthma, uncomplicated: Secondary | ICD-10-CM | POA: Diagnosis present

## 2023-09-20 DIAGNOSIS — I130 Hypertensive heart and chronic kidney disease with heart failure and stage 1 through stage 4 chronic kidney disease, or unspecified chronic kidney disease: Principal | ICD-10-CM | POA: Diagnosis present

## 2023-09-20 DIAGNOSIS — Z8051 Family history of malignant neoplasm of kidney: Secondary | ICD-10-CM

## 2023-09-20 DIAGNOSIS — N1832 Chronic kidney disease, stage 3b: Secondary | ICD-10-CM | POA: Diagnosis present

## 2023-09-20 DIAGNOSIS — M940 Chondrocostal junction syndrome [Tietze]: Secondary | ICD-10-CM | POA: Diagnosis present

## 2023-09-20 DIAGNOSIS — I5033 Acute on chronic diastolic (congestive) heart failure: Secondary | ICD-10-CM | POA: Diagnosis present

## 2023-09-20 DIAGNOSIS — Z5986 Financial insecurity: Secondary | ICD-10-CM

## 2023-09-20 DIAGNOSIS — M069 Rheumatoid arthritis, unspecified: Secondary | ICD-10-CM | POA: Diagnosis present

## 2023-09-20 DIAGNOSIS — Z833 Family history of diabetes mellitus: Secondary | ICD-10-CM

## 2023-09-20 DIAGNOSIS — Z825 Family history of asthma and other chronic lower respiratory diseases: Secondary | ICD-10-CM

## 2023-09-20 DIAGNOSIS — K219 Gastro-esophageal reflux disease without esophagitis: Secondary | ICD-10-CM | POA: Diagnosis present

## 2023-09-20 DIAGNOSIS — E782 Mixed hyperlipidemia: Secondary | ICD-10-CM | POA: Diagnosis present

## 2023-09-20 DIAGNOSIS — I10 Essential (primary) hypertension: Secondary | ICD-10-CM

## 2023-09-20 DIAGNOSIS — Z880 Allergy status to penicillin: Secondary | ICD-10-CM

## 2023-09-20 DIAGNOSIS — R0602 Shortness of breath: Secondary | ICD-10-CM | POA: Diagnosis not present

## 2023-09-20 DIAGNOSIS — Z555 Less than a high school diploma: Secondary | ICD-10-CM

## 2023-09-20 DIAGNOSIS — Z79890 Hormone replacement therapy (postmenopausal): Secondary | ICD-10-CM

## 2023-09-20 DIAGNOSIS — G43909 Migraine, unspecified, not intractable, without status migrainosus: Secondary | ICD-10-CM | POA: Diagnosis present

## 2023-09-20 DIAGNOSIS — Z83438 Family history of other disorder of lipoprotein metabolism and other lipidemia: Secondary | ICD-10-CM

## 2023-09-20 DIAGNOSIS — Z823 Family history of stroke: Secondary | ICD-10-CM

## 2023-09-20 DIAGNOSIS — Z8261 Family history of arthritis: Secondary | ICD-10-CM

## 2023-09-20 DIAGNOSIS — E039 Hypothyroidism, unspecified: Secondary | ICD-10-CM | POA: Diagnosis present

## 2023-09-20 DIAGNOSIS — E669 Obesity, unspecified: Secondary | ICD-10-CM | POA: Diagnosis present

## 2023-09-20 DIAGNOSIS — R0789 Other chest pain: Secondary | ICD-10-CM | POA: Diagnosis present

## 2023-09-20 DIAGNOSIS — M109 Gout, unspecified: Secondary | ICD-10-CM | POA: Diagnosis present

## 2023-09-20 DIAGNOSIS — I509 Heart failure, unspecified: Secondary | ICD-10-CM | POA: Diagnosis present

## 2023-09-20 DIAGNOSIS — R600 Localized edema: Principal | ICD-10-CM

## 2023-09-20 DIAGNOSIS — Z7984 Long term (current) use of oral hypoglycemic drugs: Secondary | ICD-10-CM

## 2023-09-20 LAB — BASIC METABOLIC PANEL WITH GFR
Anion gap: 16 — ABNORMAL HIGH (ref 5–15)
BUN: 23 mg/dL (ref 8–23)
CO2: 20 mmol/L — ABNORMAL LOW (ref 22–32)
Calcium: 10.2 mg/dL (ref 8.9–10.3)
Chloride: 108 mmol/L (ref 98–111)
Creatinine, Ser: 2.01 mg/dL — ABNORMAL HIGH (ref 0.44–1.00)
GFR, Estimated: 26 mL/min — ABNORMAL LOW (ref >60)
Glucose, Bld: 155 mg/dL — ABNORMAL HIGH (ref 70–99)
Potassium: 4.8 mmol/L (ref 3.5–5.1)
Sodium: 144 mmol/L (ref 135–145)

## 2023-09-20 LAB — CBC
HCT: 31.8 % — ABNORMAL LOW (ref 36.0–46.0)
Hemoglobin: 10.1 g/dL — ABNORMAL LOW (ref 12.0–15.0)
MCH: 29.7 pg (ref 26.0–34.0)
MCHC: 31.8 g/dL (ref 30.0–36.0)
MCV: 93.5 fL (ref 80.0–100.0)
Platelets: 163 K/uL (ref 150–400)
RBC: 3.4 MIL/uL — ABNORMAL LOW (ref 3.87–5.11)
RDW: 15.1 % (ref 11.5–15.5)
WBC: 4.6 K/uL (ref 4.0–10.5)
nRBC: 0 % (ref 0.0–0.2)

## 2023-09-20 LAB — BRAIN NATRIURETIC PEPTIDE: B Natriuretic Peptide: 85.4 pg/mL (ref 0.0–100.0)

## 2023-09-20 MED ORDER — FUROSEMIDE 10 MG/ML IJ SOLN: 40.0000 mg | Freq: Once | INTRAMUSCULAR | Status: DC

## 2023-09-20 MED ORDER — FUROSEMIDE 10 MG/ML IJ SOLN
80.0000 mg | Freq: Once | INTRAMUSCULAR | Status: AC
  Administered 2023-09-20: 80 mg via INTRAVENOUS
  Filled 2023-09-20: qty 8

## 2023-09-20 NOTE — ED Provider Notes (Signed)
 [facility] Provider Note    Event Date/Time   First MD Initiated Contact with Patient 09/20/23 2304     (approximate)   History   Leg Swelling   HPI  Michele Moore is a 73 y.o. female   Past medical history of CKD, CHF, hypertension, hypothyroid, here with bilateral lower extremity edema, swelling in bilateral hands, orthopnea and exertional dyspnea worsening over the last several weeks.  Significant weight gain as well.  No respiratory infectious symptoms like cough.  No chest pain but does have some pain in the left shoulder, atraumatic.  No other acute medical complaints   External Medical Documents Reviewed: Nephrology outpatient notes      Physical Exam   Triage Vital Signs: ED Triage Vitals  Encounter Vitals Group     BP 09/20/23 2232 (!) 142/116     Girls Systolic BP Percentile --      Girls Diastolic BP Percentile --      Boys Systolic BP Percentile --      Boys Diastolic BP Percentile --      Pulse Rate 09/20/23 2232 73     Resp 09/20/23 2232 16     Temp 09/20/23 2232 98.3 F (36.8 C)     Temp Source 09/20/23 2232 Oral     SpO2 09/20/23 2232 98 %     Weight --      Height --      Head Circumference --      Peak Flow --      Pain Score 09/20/23 2233 10     Pain Loc --      Pain Education --      Exclude from Growth Chart --     Most recent vital signs: Vitals:   09/20/23 2232  BP: (!) 142/116  Pulse: 73  Resp: 16  Temp: 98.3 F (36.8 C)  SpO2: 98%    General: Awake, no distress.  CV:  Good peripheral perfusion.  Resp:  Normal effort.  Abd:  No distention.  Other:  Bilateral lower extremity pitting edema up to the around the knees.  Rales at bilateral lung bases without focality.  No hypoxemia breathing comfortably on room air.  Soft benign abdominal exam.   ED Results / Procedures / Treatments   Labs (all labs ordered are listed, but only abnormal results are displayed) Labs Reviewed  CBC - Abnormal;  Notable for the following components:      Result Value   RBC 3.40 (*)    Hemoglobin 10.1 (*)    HCT 31.8 (*)    All other components within normal limits  BASIC METABOLIC PANEL WITH GFR - Abnormal; Notable for the following components:   CO2 20 (*)    Glucose, Bld 155 (*)    Creatinine, Ser 2.01 (*)    GFR, Estimated 26 (*)    Anion gap 16 (*)    All other components within normal limits  BRAIN NATRIURETIC PEPTIDE  URINALYSIS, ROUTINE W REFLEX MICROSCOPIC  TSH  T4, FREE  TROPONIN I (HIGH SENSITIVITY)     I ordered and reviewed the above labs they are notable for creatinine of 2.01 which is around baseline  EKG  ED ECG REPORT I, Ginnie Shams, the attending physician, personally viewed and interpreted this ECG.   Date: 09/20/2023  EKG Time: 2347  Rate: 62  Rhythm: sinus  Axis: nl  Intervals:nl  ST&T Change: no stemi, +PVC    RADIOLOGY I independently reviewed  and interpreted chest x-ray and see opacities consistent with pulmonary edema I also reviewed radiologist's formal read.   PROCEDURES:  Critical Care performed: No  Procedures   MEDICATIONS ORDERED IN ED: Medications  furosemide  (LASIX ) injection 80 mg (80 mg Intravenous Given 09/20/23 2357)    External physician / consultants:  I spoke with hospital medicine for admission and regarding care plan for this patient.   IMPRESSION / MDM / ASSESSMENT AND PLAN / ED COURSE  I reviewed the triage vital signs and the nursing notes.                                Patient's presentation is most consistent with acute presentation with potential threat to life or bodily function.  Differential diagnosis includes, but is not limited to, CHF exacerbation, respiratory infection, ACS, PE, nephrotic syndrome worsening kidney function   The patient is on the cardiac monitor to evaluate for evidence of arrhythmia and/or significant heart rate changes.  MDM:   I think her clinical symptoms and clinical exam is most  consistent with CHF exacerbation with a history of the same, she even states that her 40 mg of Lasix  used to make her urinate quite a bit but has not been working to the same degree over the last several weeks as her swelling has gotten worse and her orthopnea and exertional dyspnea as well.  Her chest x-ray shows signs consistent with this as well.  Will treat with 80 mg of IV Lasix .  Considered PE but less likely in the setting of suspected CHF exacerbation.  No infectious symptoms to suggest respiratory infection no focality on chest x-ray as well, defer antibiotic.  No chest pain but does have atraumatic left shoulder pain, would be very curious presentation of ACS, will check EKG which fortunately looks nonischemic and also serial troponin.  Given the significant weight gain, severe symptoms, will admit for diuresis.          FINAL CLINICAL IMPRESSION(S) / ED DIAGNOSES   Final diagnoses:  Peripheral edema  Acute on chronic congestive heart failure, unspecified heart failure type (HCC)     Rx / DC Orders   ED Discharge Orders     None        Note:  This document was prepared using Dragon voice recognition software and may include unintentional dictation errors.    Cyrena Mylar, MD 09/21/23 ST[place]

## 2023-09-20 NOTE — ED Triage Notes (Signed)
 Pt presents via POV c/o bilateral lower extremity swelling and swelling in bilateral wrists. Pt reports has been going on for weeks. Ambulatory to triage using RW. A&O x4.   Denies chest pain/SOB.

## 2023-09-21 ENCOUNTER — Observation Stay
Admit: 2023-09-21 | Discharge: 2023-09-21 | Disposition: A | Discharge status: Home / Self Care | Attending: Family Medicine

## 2023-09-21 ENCOUNTER — Ambulatory Visit (INDEPENDENT_AMBULATORY_CARE_PROVIDER_SITE_OTHER)

## 2023-09-21 ENCOUNTER — Observation Stay

## 2023-09-21 DIAGNOSIS — I10 Essential (primary) hypertension: Secondary | ICD-10-CM

## 2023-09-21 DIAGNOSIS — I509 Heart failure, unspecified: Secondary | ICD-10-CM | POA: Diagnosis present

## 2023-09-21 DIAGNOSIS — E669 Obesity, unspecified: Secondary | ICD-10-CM | POA: Diagnosis present

## 2023-09-21 DIAGNOSIS — Z803 Family history of malignant neoplasm of breast: Secondary | ICD-10-CM | POA: Diagnosis not present

## 2023-09-21 DIAGNOSIS — N1832 Chronic kidney disease, stage 3b: Secondary | ICD-10-CM | POA: Diagnosis present

## 2023-09-21 DIAGNOSIS — M069 Rheumatoid arthritis, unspecified: Secondary | ICD-10-CM | POA: Diagnosis present

## 2023-09-21 DIAGNOSIS — Z8249 Family history of ischemic heart disease and other diseases of the circulatory system: Secondary | ICD-10-CM | POA: Diagnosis not present

## 2023-09-21 DIAGNOSIS — Z8261 Family history of arthritis: Secondary | ICD-10-CM | POA: Diagnosis not present

## 2023-09-21 DIAGNOSIS — M109 Gout, unspecified: Secondary | ICD-10-CM | POA: Diagnosis present

## 2023-09-21 DIAGNOSIS — Z79890 Hormone replacement therapy (postmenopausal): Secondary | ICD-10-CM | POA: Diagnosis not present

## 2023-09-21 DIAGNOSIS — G894 Chronic pain syndrome: Secondary | ICD-10-CM | POA: Diagnosis present

## 2023-09-21 DIAGNOSIS — Z823 Family history of stroke: Secondary | ICD-10-CM | POA: Diagnosis not present

## 2023-09-21 DIAGNOSIS — Z87891 Personal history of nicotine dependence: Secondary | ICD-10-CM | POA: Diagnosis not present

## 2023-09-21 DIAGNOSIS — J45909 Unspecified asthma, uncomplicated: Secondary | ICD-10-CM | POA: Diagnosis present

## 2023-09-21 DIAGNOSIS — M1000 Idiopathic gout, unspecified site: Secondary | ICD-10-CM

## 2023-09-21 DIAGNOSIS — Z5986 Financial insecurity: Secondary | ICD-10-CM | POA: Diagnosis not present

## 2023-09-21 DIAGNOSIS — I5032 Chronic diastolic (congestive) heart failure: Secondary | ICD-10-CM | POA: Diagnosis not present

## 2023-09-21 DIAGNOSIS — Z833 Family history of diabetes mellitus: Secondary | ICD-10-CM | POA: Diagnosis not present

## 2023-09-21 DIAGNOSIS — K219 Gastro-esophageal reflux disease without esophagitis: Secondary | ICD-10-CM | POA: Diagnosis present

## 2023-09-21 DIAGNOSIS — I130 Hypertensive heart and chronic kidney disease with heart failure and stage 1 through stage 4 chronic kidney disease, or unspecified chronic kidney disease: Secondary | ICD-10-CM | POA: Diagnosis present

## 2023-09-21 DIAGNOSIS — E782 Mixed hyperlipidemia: Secondary | ICD-10-CM | POA: Diagnosis present

## 2023-09-21 DIAGNOSIS — I5033 Acute on chronic diastolic (congestive) heart failure: Secondary | ICD-10-CM | POA: Diagnosis present

## 2023-09-21 DIAGNOSIS — R0602 Shortness of breath: Secondary | ICD-10-CM | POA: Diagnosis present

## 2023-09-21 DIAGNOSIS — Z7984 Long term (current) use of oral hypoglycemic drugs: Secondary | ICD-10-CM | POA: Diagnosis not present

## 2023-09-21 DIAGNOSIS — Z79899 Other long term (current) drug therapy: Secondary | ICD-10-CM | POA: Diagnosis not present

## 2023-09-21 DIAGNOSIS — Z825 Family history of asthma and other chronic lower respiratory diseases: Secondary | ICD-10-CM | POA: Diagnosis not present

## 2023-09-21 DIAGNOSIS — E039 Hypothyroidism, unspecified: Secondary | ICD-10-CM

## 2023-09-21 DIAGNOSIS — Z83438 Family history of other disorder of lipoprotein metabolism and other lipidemia: Secondary | ICD-10-CM | POA: Diagnosis not present

## 2023-09-21 DIAGNOSIS — D631 Anemia in chronic kidney disease: Secondary | ICD-10-CM | POA: Diagnosis present

## 2023-09-21 LAB — ECHOCARDIOGRAM COMPLETE
AR max vel: 3.89 cm2
AV Area VTI: 4.34 cm2
AV Area mean vel: 3.93 cm2
AV Mean grad: 4 mmHg
AV Peak grad: 6.9 mmHg
Ao pk vel: 1.31 m/s
Area-P 1/2: 2.83 cm2
Calc EF: 44.6 %
MV VTI: 4.13 cm2
S' Lateral: 3.5 cm
Single Plane A2C EF: 34.8 %
Single Plane A4C EF: 53.7 %
Weight: 4528 [oz_av]

## 2023-09-21 LAB — URINALYSIS, ROUTINE W REFLEX MICROSCOPIC
Bilirubin Urine: NEGATIVE
Glucose, UA: 50 mg/dL — AB
Hgb urine dipstick: NEGATIVE
Ketones, ur: NEGATIVE mg/dL
Leukocytes,Ua: NEGATIVE
Nitrite: NEGATIVE
Protein, ur: NEGATIVE mg/dL
Specific Gravity, Urine: 1.005 (ref 1.005–1.030)
pH: 6 (ref 5.0–8.0)

## 2023-09-21 LAB — CBC
HCT: 30.1 % — ABNORMAL LOW (ref 36.0–46.0)
Hemoglobin: 10 g/dL — ABNORMAL LOW (ref 12.0–15.0)
MCH: 30.4 pg (ref 26.0–34.0)
MCHC: 33.2 g/dL (ref 30.0–36.0)
MCV: 91.5 fL (ref 80.0–100.0)
Platelets: 155 K/uL (ref 150–400)
RBC: 3.29 MIL/uL — ABNORMAL LOW (ref 3.87–5.11)
RDW: 15 % (ref 11.5–15.5)
WBC: 4.5 K/uL (ref 4.0–10.5)
nRBC: 0 % (ref 0.0–0.2)

## 2023-09-21 LAB — TSH: TSH: 0.024 u[IU]/mL — ABNORMAL LOW (ref 0.350–4.500)

## 2023-09-21 LAB — BASIC METABOLIC PANEL WITH GFR
Anion gap: 13 (ref 5–15)
BUN: 23 mg/dL (ref 8–23)
CO2: 26 mmol/L (ref 22–32)
Calcium: 10.1 mg/dL (ref 8.9–10.3)
Chloride: 105 mmol/L (ref 98–111)
Creatinine, Ser: 1.9 mg/dL — ABNORMAL HIGH (ref 0.44–1.00)
GFR, Estimated: 27 mL/min — ABNORMAL LOW (ref >60)
Glucose, Bld: 107 mg/dL — ABNORMAL HIGH (ref 70–99)
Potassium: 3.3 mmol/L — ABNORMAL LOW (ref 3.5–5.1)
Sodium: 144 mmol/L (ref 135–145)

## 2023-09-21 LAB — TROPONIN I (HIGH SENSITIVITY)
Troponin I (High Sensitivity): 14 ng/L
Troponin I (High Sensitivity): 15 ng/L (ref <18)
Troponin I (High Sensitivity): 17 ng/L (ref <18)

## 2023-09-21 LAB — CUP PACEART REMOTE DEVICE CHECK
Date Time Interrogation Session: 20250727232430
Implantable Pulse Generator Implant Date: 20230629

## 2023-09-21 LAB — T4, FREE: Free T4: 1.09 ng/dL (ref 0.61–1.12)

## 2023-09-21 MED ORDER — DAPAGLIFLOZIN PROPANEDIOL 10 MG PO TABS: 10.0000 mg | ORAL_TABLET | Freq: Every morning | ORAL | Status: DC

## 2023-09-21 MED ORDER — TRAZODONE HCL 50 MG PO TABS
25.0000 mg | ORAL_TABLET | Freq: Every evening | ORAL | Status: DC | PRN
  Administered 2023-09-23 – 2023-09-24 (×2): 25 mg via ORAL
  Filled 2023-09-21 (×2): qty 1

## 2023-09-21 MED ORDER — ACETAMINOPHEN 325 MG PO TABS
650.0000 mg | ORAL_TABLET | Freq: Four times a day (QID) | ORAL | Status: DC | PRN
  Administered 2023-09-21 – 2023-09-24 (×3): 650 mg via ORAL
  Filled 2023-09-21 (×4): qty 2

## 2023-09-21 MED ORDER — CALCIPOTRIENE 0.005 % EX CREA: TOPICAL_CREAM | Freq: Two times a day (BID) | CUTANEOUS | Status: DC

## 2023-09-21 MED ORDER — ROSUVASTATIN CALCIUM 10 MG PO TABS
10.0000 mg | ORAL_TABLET | Freq: Every day | ORAL | Status: DC
  Administered 2023-09-21 – 2023-09-25 (×5): 10 mg via ORAL
  Filled 2023-09-21 (×5): qty 1

## 2023-09-21 MED ORDER — FUROSEMIDE 10 MG/ML IJ SOLN
40.0000 mg | Freq: Two times a day (BID) | INTRAMUSCULAR | Status: DC
  Administered 2023-09-21 – 2023-09-22 (×3): 40 mg via INTRAVENOUS
  Filled 2023-09-21 (×3): qty 4

## 2023-09-21 MED ORDER — POTASSIUM CHLORIDE CRYS ER 20 MEQ PO TBCR
40.0000 meq | EXTENDED_RELEASE_TABLET | Freq: Once | ORAL | Status: DC
  Filled 2023-09-21: qty 2

## 2023-09-21 MED ORDER — OMEGA-3-ACID ETHYL ESTERS 1 G PO CAPS
1.0000 | ORAL_CAPSULE | Freq: Two times a day (BID) | ORAL | Status: DC
  Administered 2023-09-21 – 2023-09-25 (×9): 1 g via ORAL
  Filled 2023-09-21 (×10): qty 1

## 2023-09-21 MED ORDER — MONTELUKAST SODIUM 10 MG PO TABS
10.0000 mg | ORAL_TABLET | Freq: Every day | ORAL | Status: DC
  Administered 2023-09-21 – 2023-09-24 (×4): 10 mg via ORAL
  Filled 2023-09-21 (×4): qty 1

## 2023-09-21 MED ORDER — ERYTHROMYCIN 5 MG/GM OP OINT: 1.0000 | TOPICAL_OINTMENT | Freq: Three times a day (TID) | OPHTHALMIC | Status: DC

## 2023-09-21 MED ORDER — ALBUTEROL SULFATE HFA 108 (90 BASE) MCG/ACT IN AERS: 2.0000 | INHALATION_SPRAY | Freq: Four times a day (QID) | RESPIRATORY_TRACT | Status: DC | PRN

## 2023-09-21 MED ORDER — LORATADINE 10 MG PO TABS
10.0000 mg | ORAL_TABLET | Freq: Every day | ORAL | Status: DC
  Administered 2023-09-21 – 2023-09-25 (×5): 10 mg via ORAL
  Filled 2023-09-21 (×5): qty 1

## 2023-09-21 MED ORDER — VITAMIN D 25 MCG (1000 UNIT) PO TABS
2000.0000 [IU] | ORAL_TABLET | Freq: Every day | ORAL | Status: DC
  Administered 2023-09-21 – 2023-09-25 (×5): 2000 [IU] via ORAL
  Filled 2023-09-21 (×5): qty 2

## 2023-09-21 MED ORDER — POTASSIUM CHLORIDE CRYS ER 20 MEQ PO TBCR
40.0000 meq | EXTENDED_RELEASE_TABLET | Freq: Every day | ORAL | Status: DC
  Administered 2023-09-21 – 2023-09-25 (×5): 40 meq via ORAL
  Filled 2023-09-21 (×5): qty 2

## 2023-09-21 MED ORDER — POTASSIUM CHLORIDE CRYS ER 20 MEQ PO TBCR: 20.0000 meq | EXTENDED_RELEASE_TABLET | Freq: Every day | ORAL | Status: DC

## 2023-09-21 MED ORDER — ENOXAPARIN SODIUM 60 MG/0.6ML IJ SOSY
60.0000 mg | PREFILLED_SYRINGE | INTRAMUSCULAR | Status: DC
  Administered 2023-09-21 – 2023-09-24 (×4): 60 mg via SUBCUTANEOUS
  Filled 2023-09-21 (×4): qty 0.6

## 2023-09-21 MED ORDER — ACETAMINOPHEN 650 MG RE SUPP: 650.0000 mg | Freq: Four times a day (QID) | RECTAL | Status: DC | PRN

## 2023-09-21 MED ORDER — LEVOTHYROXINE SODIUM 137 MCG PO TABS
137.0000 ug | ORAL_TABLET | Freq: Every day | ORAL | Status: DC
  Administered 2023-09-21 – 2023-09-23 (×3): 137 ug via ORAL
  Filled 2023-09-21 (×3): qty 1

## 2023-09-21 MED ORDER — GABAPENTIN 300 MG PO CAPS
600.0000 mg | ORAL_CAPSULE | Freq: Every day | ORAL | Status: DC
  Administered 2023-09-21 – 2023-09-24 (×4): 600 mg via ORAL
  Filled 2023-09-21 (×4): qty 2

## 2023-09-21 MED ORDER — EMPAGLIFLOZIN 10 MG PO TABS
10.0000 mg | ORAL_TABLET | Freq: Every morning | ORAL | Status: DC
  Administered 2023-09-22 – 2023-09-25 (×4): 10 mg via ORAL
  Filled 2023-09-21 (×4): qty 1

## 2023-09-21 MED ORDER — ADULT MULTIVITAMIN W/MINERALS CH
1.0000 | ORAL_TABLET | Freq: Every day | ORAL | Status: DC
  Administered 2023-09-21 – 2023-09-25 (×5): 1 via ORAL
  Filled 2023-09-21 (×5): qty 1

## 2023-09-21 MED ORDER — ONDANSETRON HCL 4 MG/2ML IJ SOLN
4.0000 mg | Freq: Four times a day (QID) | INTRAMUSCULAR | Status: DC | PRN
Start: 2023-09-21 — End: 2023-09-25

## 2023-09-21 MED ORDER — TRIAMCINOLONE ACETONIDE 0.5 % EX CREA
TOPICAL_CREAM | Freq: Two times a day (BID) | CUTANEOUS | Status: DC
  Filled 2023-09-21: qty 15

## 2023-09-21 MED ORDER — ONDANSETRON HCL 4 MG PO TABS: 4.0000 mg | ORAL_TABLET | Freq: Four times a day (QID) | ORAL | Status: DC | PRN

## 2023-09-21 MED ORDER — MECLIZINE HCL 25 MG PO TABS: 12.5000 mg | ORAL_TABLET | Freq: Three times a day (TID) | ORAL | Status: DC | PRN

## 2023-09-21 MED ORDER — LOSARTAN POTASSIUM 25 MG PO TABS
25.0000 mg | ORAL_TABLET | Freq: Every day | ORAL | Status: DC
  Administered 2023-09-21 – 2023-09-23 (×3): 25 mg via ORAL
  Filled 2023-09-21 (×3): qty 1

## 2023-09-21 MED ORDER — HYDROCORTISONE 2.5 % EX OINT: TOPICAL_OINTMENT | Freq: Two times a day (BID) | CUTANEOUS | Status: DC | PRN

## 2023-09-21 MED ORDER — PERFLUTREN LIPID MICROSPHERE
1.0000 mL | INTRAVENOUS | Status: AC | PRN
  Administered 2023-09-21: 2 mL via INTRAVENOUS

## 2023-09-21 MED ORDER — FLUCONAZOLE 100 MG PO TABS
200.0000 mg | ORAL_TABLET | ORAL | Status: DC
  Administered 2023-09-22: 200 mg via ORAL
  Filled 2023-09-21: qty 2

## 2023-09-21 MED ORDER — METOPROLOL TARTRATE 25 MG PO TABS
25.0000 mg | ORAL_TABLET | Freq: Two times a day (BID) | ORAL | Status: DC
Start: 2023-09-21 — End: 2023-09-25
  Administered 2023-09-21 – 2023-09-25 (×8): 25 mg via ORAL
  Filled 2023-09-21 (×9): qty 1

## 2023-09-21 MED ORDER — ALBUTEROL SULFATE (2.5 MG/3ML) 0.083% IN NEBU: 2.5000 mg | INHALATION_SOLUTION | Freq: Four times a day (QID) | RESPIRATORY_TRACT | Status: DC | PRN

## 2023-09-21 MED ORDER — MAGNESIUM HYDROXIDE 400 MG/5ML PO SUSP: 30.0000 mL | Freq: Every day | ORAL | Status: DC | PRN

## 2023-09-21 MED ORDER — PANTOPRAZOLE SODIUM 40 MG PO TBEC
40.0000 mg | DELAYED_RELEASE_TABLET | Freq: Every day | ORAL | Status: DC
  Administered 2023-09-21 – 2023-09-25 (×5): 40 mg via ORAL
  Filled 2023-09-21 (×5): qty 1

## 2023-09-21 MED ORDER — ALLOPURINOL 100 MG PO TABS
200.0000 mg | ORAL_TABLET | Freq: Every day | ORAL | Status: DC
  Administered 2023-09-21 – 2023-09-25 (×5): 200 mg via ORAL
  Filled 2023-09-21 (×6): qty 2
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
## 2023-09-21 NOTE — Assessment & Plan Note (Signed)
Will continue allopurinol. 

## 2023-09-21 NOTE — Assessment & Plan Note (Signed)
-   Will continue Synthroid  and check free T3.

## 2023-09-21 NOTE — Assessment & Plan Note (Addendum)
-   This is associated with psoriatic arthritis. - The patient is on Rinvoq,Dovonox and hydrocortisone .

## 2023-09-21 NOTE — Progress Notes (Signed)
 Heart Failure Navigator Progress Note  Assessed for Heart & Vascular TOC clinic readiness.  Patient does not meet criteria due to established [facility] patient.   Navigator will sign off at this time.  Charmaine Pines, RN, BSN [facility] Heart Failure Navigator Secure Chat Only

## 2023-09-21 NOTE — ED Notes (Signed)
 Fall precautions in place due to pt being a high fall risk. Pt refusing to put on non-skid socks.

## 2023-09-21 NOTE — Assessment & Plan Note (Signed)
Will continue antihypertensive therapy.

## 2023-09-21 NOTE — Assessment & Plan Note (Addendum)
-  The patient will be admitted to a cardiac telemetry bed. - We will continue diuresis with IV Lasix . - Will continue Farxiga . - Will check free T3 given low TSH. - Will continue beta-blocker therapy. - We Will follow serial troponins. - We will follow I's and O's and daily weights. - Cardiology consult and 2D echo will be obtained. - I notified Dr. Florencio about the patient.

## 2023-09-21 NOTE — Progress Notes (Signed)
 Nonbillable note Patient admitted to the hospital for acute diastolic dysfunction CHF.  Continue IV diuretics and supplement potassium. Cardiology consult requested
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
## 2023-09-21 NOTE — ED Notes (Addendum)
 Notified Dr. Lawence that patient's heart rate has gone into the high 40's several times over the last few hours. This RN Instructed to closely monitor and report back to Provider if heart rate drops below 40 for further orders.

## 2023-09-22 DIAGNOSIS — I5033 Acute on chronic diastolic (congestive) heart failure: Secondary | ICD-10-CM | POA: Diagnosis not present

## 2023-09-22 LAB — CBC
HCT: 32.8 % — ABNORMAL LOW (ref 36.0–46.0)
Hemoglobin: 10.8 g/dL — ABNORMAL LOW (ref 12.0–15.0)
MCH: 29.6 pg (ref 26.0–34.0)
MCHC: 32.9 g/dL (ref 30.0–36.0)
MCV: 89.9 fL (ref 80.0–100.0)
Platelets: 168 K/uL (ref 150–400)
RBC: 3.65 MIL/uL — ABNORMAL LOW (ref 3.87–5.11)
RDW: 15.1 % (ref 11.5–15.5)
WBC: 4.7 K/uL (ref 4.0–10.5)
nRBC: 0 % (ref 0.0–0.2)

## 2023-09-22 LAB — BASIC METABOLIC PANEL WITH GFR
Anion gap: 11 (ref 5–15)
BUN: 27 mg/dL — ABNORMAL HIGH (ref 8–23)
CO2: 30 mmol/L (ref 22–32)
Calcium: 9.9 mg/dL (ref 8.9–10.3)
Chloride: 100 mmol/L (ref 98–111)
Creatinine, Ser: 2.11 mg/dL — ABNORMAL HIGH (ref 0.44–1.00)
GFR, Estimated: 24 mL/min — ABNORMAL LOW (ref >60)
Glucose, Bld: 117 mg/dL — ABNORMAL HIGH (ref 70–99)
Potassium: 3.5 mmol/L (ref 3.5–5.1)
Sodium: 141 mmol/L (ref 135–145)

## 2023-09-22 MED ORDER — FUROSEMIDE 10 MG/ML IJ SOLN
40.0000 mg | Freq: Every day | INTRAMUSCULAR | Status: DC
  Administered 2023-09-22: 40 mg via INTRAVENOUS
  Filled 2023-09-22: qty 4

## 2023-09-22 MED ORDER — BUTALBITAL-APAP-CAFFEINE 50-325-40 MG PO TABS
1.0000 | ORAL_TABLET | ORAL | Status: DC | PRN
  Administered 2023-09-22 – 2023-09-25 (×9): 1 via ORAL
  Filled 2023-09-22 (×9): qty 1

## 2023-09-22 NOTE — Plan of Care (Signed)
  Problem: Education: Goal: Knowledge of General Education information will improve Description: Including pain rating scale, medication(s)/side effects and non-pharmacologic comfort measures Outcome: Progressing   Problem: Clinical Measurements: Goal: Will remain free from infection Outcome: Progressing   Problem: Activity: Goal: Risk for activity intolerance will decrease Outcome: Progressing   Problem: Nutrition: Goal: Adequate nutrition will be maintained Outcome: Progressing   Problem: Coping: Goal: Level of anxiety will decrease Outcome: Progressing   Problem: Elimination: Goal: Will not experience complications related to urinary retention Outcome: Progressing   Problem: Pain Managment: Goal: General experience of comfort will improve and/or be controlled Outcome: Progressing   Problem: Safety: Goal: Ability to remain free from injury will improve Outcome: Progressing   Problem: Skin Integrity: Goal: Risk for impaired skin integrity will decrease Outcome: Progressing

## 2023-09-22 NOTE — Progress Notes (Signed)
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 of tachy-brady, pauses or atrial fibrillation.  -Metoprolol  as stated above.  This patient's plan of care was discussed and created with Dr. Wilburn and he is in agreement.  Signed: Dorene Comfort, PA-C  09/22/2023, 10:07 AM [facility]

## 2023-09-22 NOTE — Progress Notes (Signed)
 Progress Note   Patient: Michele Moore FMW:[id number] DOB: 04/29/1950 DOA: 09/20/2023     1 DOS: the patient was seen and examined on 09/22/2023   Brief hospital course:  Michele Moore is a 73 y.o. female with medical history significant for HFpEF, stage IIIb chronic kidney disease, asthma, chronic pain, lumbar DJD, GERD, hypertension, hypothyroidism and rheumatoid arthritis, who presented to the emergency room with acute onset of bilateral lower extremity edema with swelling both hands as well as orthopnea and paroxysmal nocturnal dyspnea with dyspnea on exertion over the last several weeks.  The patient has been having significant weight gain during the same period.  He admits to wheezing and occasional cough.  No chest pain or palpitations.  She has been having left shoulder pain and denied any injuries.  No fever or chills.  No dysuria, oliguria or hematuria or flank pain.  No headache or dizziness or blurred vision.  No paresthesias or focal muscle weakness.  No bleeding diathesis.  The patient has a history of bradycardia and has a loop monitor in place   ED Course: When the patient came to the ER, BP was 142/116 with otherwise normal vital signs.  Labs revealed CO2 20 and blood glucose 155, creatinine 2.01 anion gap 16.  High-sensitivity troponin I was 14 and later 15.  BNP was 85.4.  CBC showed hemoglobin 10.1 and hematocrit of 31.8.  TSH was low 0.024 but free T4 was 1.09. EKG as reviewed by me : EKG showed sinus arrhythmia with a rate of 62 and multiform ventricular premature complexes and Q waves inferiorly as well as anteroseptally. Imaging: Portable chest x-ray showed stable cardiomegaly and interval development of central pulmonary vascular congestion without overt pulmonary edema.   The patient was given 80 mg of IV Lasix .  The patient will be admitted to an observation cardiac telemetry bed for further evaluation and management.    Assessment and Plan:  Acute on chronic diastolic  dysfunction CHF Patient presents for evaluation of bilateral lower extremity swelling, increased weight gain and orthopnea. Chest x-ray showed cardiomegaly and bilateral pulmonary vascular congestion 2D echocardiogram shows an LVEF of 50 to 55% with mild LVH and grade 1 diastolic dysfunction Continue IV Lasix  but decrease to daily since patient is normotensive and has worsening renal function Continue metoprolol , Jardiance  and losartan    Hypertension Stage IIIb chronic kidney disease Monitor renal function closely while on diuretics Continue losartan  and metoprolol    Hypothyroidism Continue Synthroid    Migraine headache Trial of Fioricet    Anemia of chronic kidney disease Monitor H&H closely     Subjective: Patient is seen and examined at the bedside.  She complains of a severe headache similar to her history of migraines  Physical Exam: Vitals:   09/22/23 0448 09/22/23 0500 09/22/23 0820 09/22/23 1206  BP: (!) 122/54  99/66 (!) 116/92  Pulse: 62  64 74  Resp: 18  16   Temp: 98 F (36.7 C)  98 F (36.7 C) 98.2 F (36.8 C)  TempSrc:   Oral Oral  SpO2: 95%  97% 97%  Weight:  122.6 kg     General: Chronically ill-appearing elderly female, well nourished, in no acute distress. HEENT: Normocephalic and atraumatic. Neck: No JVD.   Lungs: Normal respiratory effort on room air.  Diminished breath sounds bilaterally. Heart: HRRR. Normal S1 and S2 without gallops or murmurs.  Abdomen: Non-distended appearing.  Msk: Normal strength and tone for age. Extremities: Warm and well perfused. No clubbing, cyanosis. +  pedal edema.  Neuro: Alert and oriented X 3. Psych: Answers questions appropriately.     Data Reviewed: Potassium 3.5, BUN 27, creatinine 2.1, hemoglobin 10.8 Labs reviewed  Family Communication: Plan of care was discussed with patient at the bedside.  She verbalizes understanding and agrees with the plan.  Disposition: Status is: Inpatient Remains inpatient  appropriate because: On IV diuretics  Planned Discharge Destination: Home with Home Health    Time spent: 40 minutes  Author: Aimee Somerset, MD 09/22/2023 1:25 PM  For on call review [email or web address].

## 2023-09-23 DIAGNOSIS — I5033 Acute on chronic diastolic (congestive) heart failure: Secondary | ICD-10-CM | POA: Diagnosis not present

## 2023-09-23 LAB — BASIC METABOLIC PANEL WITH GFR
Anion gap: 9 (ref 5–15)
BUN: 35 mg/dL — ABNORMAL HIGH (ref 8–23)
CO2: 28 mmol/L (ref 22–32)
Calcium: 9.4 mg/dL (ref 8.9–10.3)
Chloride: 98 mmol/L (ref 98–111)
Creatinine, Ser: 2.2 mg/dL — ABNORMAL HIGH (ref 0.44–1.00)
GFR, Estimated: 23 mL/min — ABNORMAL LOW (ref >60)
Glucose, Bld: 152 mg/dL — ABNORMAL HIGH (ref 70–99)
Potassium: 3.9 mmol/L (ref 3.5–5.1)
Sodium: 135 mmol/L (ref 135–145)

## 2023-09-23 LAB — TROPONIN I (HIGH SENSITIVITY)
Troponin I (High Sensitivity): 12 ng/L (ref <18)
Troponin I (High Sensitivity): 11 ng/L (ref <18)

## 2023-09-23 LAB — T3, FREE: T3, Free: 2.9 pg/mL (ref 2.0–4.4)

## 2023-09-23 MED ORDER — PREDNISONE 20 MG PO TABS
30.0000 mg | ORAL_TABLET | Freq: Every day | ORAL | Status: DC
  Administered 2023-09-24: 30 mg via ORAL
  Filled 2023-09-23: qty 1

## 2023-09-23 MED ORDER — LEVOTHYROXINE SODIUM 100 MCG PO TABS
100.0000 ug | ORAL_TABLET | Freq: Every day | ORAL | Status: DC
  Administered 2023-09-24 – 2023-09-25 (×2): 100 ug via ORAL
  Filled 2023-09-23 (×2): qty 1

## 2023-09-23 MED ORDER — FUROSEMIDE 40 MG PO TABS: 40.0000 mg | ORAL_TABLET | Freq: Two times a day (BID) | ORAL | Status: DC

## 2023-09-23 MED ORDER — FUROSEMIDE 40 MG PO TABS
40.0000 mg | ORAL_TABLET | Freq: Every day | ORAL | Status: DC
  Administered 2023-09-23: 40 mg via ORAL
  Filled 2023-09-23: qty 1

## 2023-09-23 MED ORDER — FUROSEMIDE 40 MG PO TABS
40.0000 mg | ORAL_TABLET | Freq: Every day | ORAL | Status: DC
  Administered 2023-09-24 – 2023-09-25 (×2): 40 mg via ORAL
  Filled 2023-09-23 (×2): qty 1

## 2023-09-23 NOTE — Care Management Important Message (Signed)
 Important Message  Patient Details  Name: SERINA NICHTER MRN: [id number] Date of Birth: [birth year]   Important Message Given:  Yes - Medicare IM     Rojelio SHAUNNA Rattler 09/23/2023, 2:10 PM

## 2023-09-23 NOTE — Plan of Care (Signed)

## 2023-09-23 NOTE — Progress Notes (Signed)
 Progress Note   Patient: Michele Moore FMW:[id number] DOB: December 09, 1950 DOA: 09/20/2023     2 DOS: the patient was seen and examined on 09/23/2023   Brief hospital course:  SHANNELLE ALGUIRE is a 73 y.o. female with medical history significant for HFpEF, stage IIIb chronic kidney disease, asthma, chronic pain, lumbar DJD, GERD, hypertension, hypothyroidism and rheumatoid arthritis, who presented to the emergency room with acute onset of bilateral lower extremity edema with swelling both hands as well as orthopnea and paroxysmal nocturnal dyspnea with dyspnea on exertion over the last several weeks.  The patient has been having significant weight gain during the same period.  He admits to wheezing and occasional cough.  No chest pain or palpitations.  She has been having left shoulder pain and denied any injuries.  No fever or chills.  No dysuria, oliguria or hematuria or flank pain.  No headache or dizziness or blurred vision.  No paresthesias or focal muscle weakness.  No bleeding diathesis.  The patient has a history of bradycardia and has a loop monitor in place   ED Course: When the patient came to the ER, BP was 142/116 with otherwise normal vital signs.  Labs revealed CO2 20 and blood glucose 155, creatinine 2.01 anion gap 16.  High-sensitivity troponin I was 14 and later 15.  BNP was 85.4.  CBC showed hemoglobin 10.1 and hematocrit of 31.8.  TSH was low 0.024 but free T4 was 1.09. EKG as reviewed by me : EKG showed sinus arrhythmia with a rate of 62 and multiform ventricular premature complexes and Q waves inferiorly as well as anteroseptally. Imaging: Portable chest x-ray showed stable cardiomegaly and interval development of central pulmonary vascular congestion without overt pulmonary edema.   The patient was given 80 mg of IV Lasix .  The patient will be admitted to an observation cardiac telemetry bed for further evaluation and management.      Assessment and Plan:  Atypical chest  pain Costochondritis Patient with complaints of right-sided chest pain that is reproducible Twelve-lead EKG is within normal limits Obtain serial cardiac enzymes Trial of steroids   Acute on chronic diastolic dysfunction CHF Improved Patient presented for evaluation of bilateral lower extremity swelling, increased weight gain and orthopnea. Chest x-ray showed cardiomegaly and bilateral pulmonary vascular congestion 2D echocardiogram shows an LVEF of 50 to 55% with mild LVH and grade 1 diastolic dysfunction Switch Lasix  to oral and monitor renal function closely Continue metoprolol , Jardiance  a     Hypertension Stage IIIb chronic kidney disease Monitor renal function closely while on diuretics Hold losartan  due to worsening renal function Continue  metoprolol      Hypothyroidism Continue Synthroid      Migraine headache Improved Continue as needed Fioricet      Anemia of chronic kidney disease Monitor H&H closely    Subjective: Patient is seen and examined at the bedside.  She complains of right-sided chest pain.  Pain is reproducible.  Denies having any other symptoms  Physical Exam: Vitals:   09/23/23 0400 09/23/23 0600 09/23/23 0813 09/23/23 1238  BP: 116/62  110/64 118/60  Pulse: 60  63 (!) 59  Resp: 18  17   Temp: 98.4 F (36.9 C)  98 F (36.7 C) 98.8 F (37.1 C)  TempSrc: Oral  Oral Axillary  SpO2: 96%  98% 95%  Weight:  121.5 kg     General: Chronically ill-appearing elderly female, well nourished, in no acute distress. HEENT: Normocephalic and atraumatic. Neck: No JVD.   Lungs:  Normal respiratory effort on room air.  Diminished breath sounds bilaterally. Heart: HRRR. Normal S1 and S2 without gallops or murmurs.  Reproducible chest pain Abdomen: Non-distended appearing.  Msk: Normal strength and tone for age. Extremities: Warm and well perfused. No clubbing, cyanosis. + pedal edema.  Neuro: Alert and oriented X 3. Psych: Answers questions  appropriately.      Data Reviewed: Creatinine 2.20, BUN 35 Labs reviewed  Family Communication: Plan of care was discussed with patient in detail.  For possible discharge in a.m.  Disposition: Status is: Inpatient Remains inpatient appropriate because: For possible discharge in a.m.  Planned Discharge Destination: Home with Home Health    Time spent: 40 minutes  Author: Aimee Somerset, MD 09/23/2023 2:19 PM  For on call review [email or web address].

## 2023-09-23 NOTE — Progress Notes (Signed)
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 evidence of tachy-brady, pauses or atrial fibrillation.  -Metoprolol  as stated above.  No further cardiac recommendations at this time. Cardiology will sign off. Please haiku with questions or re-engage if needed.  Follow-up scheduled with heart failure clinic via telemetry (at patient request) on 08/04 at 2 PM.  Follow-up with Dr. Florencio within 2 weeks.   This patient's plan of care was discussed and created with Dr. Wilburn and he is in agreement.  Signed: Dorene Comfort, PA-C  09/23/2023, 7:40 AM [facility] Cardiology

## 2023-09-23 NOTE — Plan of Care (Signed)
   Problem: Education: Goal: Knowledge of General Education information will improve Description: Including pain rating scale, medication(s)/side effects and non-pharmacologic comfort measures Outcome: Progressing   Problem: Activity: Goal: Risk for activity intolerance will decrease Outcome: Progressing   Problem: Pain Managment: Goal: General experience of comfort will improve and/or be controlled Outcome: Progressing

## 2023-09-23 NOTE — Progress Notes (Signed)
 Transition of Care [facility]) - Inpatient Brief Assessment   Patient Details  Name: Michele Moore MRN: [id number] Date of Birth: 04/22/1950  [facility]) CM/SW Contact:    Selma Rodelo C Mahagony Grieb, RN Phone Number: 09/23/2023, 2:46 PM   Clinical Narrative: TOC continuing to follow patient's progress throughout discharge planning.   Transition of Care Asessment: Insurance and Status: Insurance coverage has been reviewed Patient has primary care physician: Yes   Prior level of function:: Independent Prior/Current Home Services: No current home services Social Drivers of Health Review: SDOH reviewed no interventions necessary Readmission risk has been reviewed: Yes Transition of care needs: no transition of care needs at this time

## 2023-09-23 NOTE — Evaluation (Signed)
 Physical Therapy Evaluation Patient Details Name: Michele Moore MRN: [id number] DOB: 10-06-1950 Today's Date: 09/23/2023  History of Present Illness  73 y.o. female with medical history significant for HFpEF, stage IIIb chronic kidney disease, asthma, chronic pain, lumbar DJD, GERD, hypertension, hypothyroidism and rheumatoid arthritis, who presented to the emergency room with acute onset of bilateral lower extremity edema with swelling both hands as well as orthopnea and paroxysmal nocturnal dyspnea with dyspnea on exertion over the last several weeks.  Clinical Impression  Pt was initially hesitant to do a lot with PT, but did acknowledge that some activity is good and necessary for her.  She ultimately agreed to do some in room (I'm not going in that hallway) ambulation and did multiple loops (over 100 ft) with safe and appropriate cadence, able to hold conversation the entire time, no safety issues.  Pt does not quite feel to be at her baseline, but does not feel she will need PT following up with her once she is medically ready for d/c and has her fluid situation improved.         If plan is discharge home, recommend the following: Assist for transportation   Can travel by private vehicle        Equipment Recommendations None recommended by PT  Recommendations for Other Services       Functional Status Assessment Patient has had a recent decline in their functional status and demonstrates the ability to make significant improvements in function in a reasonable and predictable amount of time.     Precautions / Restrictions Precautions Precautions: Fall Recall of Precautions/Restrictions: Intact Restrictions Weight Bearing Restrictions Per Provider Order: No      Mobility  Bed Mobility Overal bed mobility: Modified Independent             General bed mobility comments: needed to use UEs to get LEs back up into bed, no PT assist.    Transfers Overall transfer level:  Modified independent Equipment used: Rollator (4 wheels)               General transfer comment: Pt able to rise to standing with minimal UE reliance, good confidence and stability    Ambulation/Gait Ambulation/Gait assistance: Supervision Gait Distance (Feet): 125 Feet Assistive device: Rollator (4 wheels)         General Gait Details: Pt was able to do multiple in-room loops, holding conversation t/o the effort HR stays in the 70s.  Minimal fatigue with no LOBs or other safety concerns.  Stairs            Wheelchair Mobility     Tilt Bed    Modified Rankin (Stroke Patients Only)       Balance Overall balance assessment: Modified Independent (with [id number])                                           Pertinent Vitals/Pain Pain Assessment Pain Assessment: Faces Faces Pain Scale: Hurts even more Pain Location: c/o OA pain nearly everywhere, most notedly in knees and back today    Home Living Family/patient expects to be discharged to:: Private residence Living Arrangements: Alone Available Help at Discharge: Available PRN/intermittently   Home Access: Ramped entrance       Home Layout: One level Home Equipment: Agricultural consultant (2 wheels);Rollator (4 wheels);[place] - single point      Prior Function Prior  Level of Function : Independent/Modified Independent;Driving             Mobility Comments: Pt reports being up 30 minutes to grocery shop is getting harder and harder ADLs Comments: manages the home w/o assist     Extremity/Trunk Assessment   Upper Extremity Assessment Upper Extremity Assessment: Overall WFL for tasks assessed;Generalized weakness (OA and total shoulder limitations)    Lower Extremity Assessment Lower Extremity Assessment: Overall WFL for tasks assessed;Generalized weakness (functional t/o, chronic pain with full ROM)       Communication   Communication Communication: No apparent difficulties     Cognition Arousal: Alert Behavior During Therapy: WFL for tasks assessed/performed   PT - Cognitive impairments: No apparent impairments                         Following commands: Intact       Cueing Cueing Techniques: Verbal cues     General Comments      Exercises     Assessment/Plan    PT Assessment Patient needs continued PT services  PT Problem List         PT Treatment Interventions DME instruction;Gait training;Stair training;Functional mobility training;Therapeutic activities;Therapeutic exercise;Patient/family education    PT Goals (Current goals can be found in the Care Plan section)  Acute Rehab PT Goals Patient Stated Goal: get her swelling down PT Goal Formulation: With patient Time For Goal Achievement: 10/06/23 Potential to Achieve Goals: Good    Frequency Min 1X/week     Co-evaluation               AM-PAC PT 6 Clicks Mobility  Outcome Measure Help needed turning from your back to your side while in a flat bed without using bedrails?: None Help needed moving from lying on your back to sitting on the side of a flat bed without using bedrails?: None Help needed moving to and from a bed to a chair (including a wheelchair)?: None Help needed standing up from a chair using your arms (e.g., wheelchair or bedside chair)?: None Help needed to walk in hospital room?: A Little Help needed climbing 3-5 steps with a railing? : A Little 6 Click Score: 22    End of Session Equipment Utilized During Treatment: Gait belt Activity Tolerance: Patient tolerated treatment well Patient left: with call bell/phone within reach;with bed alarm set Nurse Communication: Mobility status PT Visit Diagnosis: Muscle weakness (generalized) (M62.81);Difficulty in walking, not elsewhere classified (R26.2)    Time: 8576-8552 PT Time Calculation (min) (ACUTE ONLY): 24 min   Charges:   PT Evaluation $PT Eval Low Complexity: 1 Low PT Treatments $Gait  Training: 8-22 mins PT General Charges $$ ACUTE PT VISIT: 1 Visit         Carmin JONELLE Deed, DPT 09/23/2023, 3:14 PM

## 2023-09-24 DIAGNOSIS — I5033 Acute on chronic diastolic (congestive) heart failure: Secondary | ICD-10-CM | POA: Diagnosis not present

## 2023-09-24 LAB — BASIC METABOLIC PANEL WITH GFR
Anion gap: 9 (ref 5–15)
BUN: 43 mg/dL — ABNORMAL HIGH (ref 8–23)
CO2: 25 mmol/L (ref 22–32)
Calcium: 9.3 mg/dL (ref 8.9–10.3)
Chloride: 101 mmol/L (ref 98–111)
Creatinine, Ser: 2.16 mg/dL — ABNORMAL HIGH (ref 0.44–1.00)
GFR, Estimated: 23 mL/min — ABNORMAL LOW (ref >60)
Glucose, Bld: 119 mg/dL — ABNORMAL HIGH (ref 70–99)
Potassium: 3.9 mmol/L (ref 3.5–5.1)
Sodium: 135 mmol/L (ref 135–145)

## 2023-09-24 MED ORDER — PREDNISONE 20 MG PO TABS
30.0000 mg | ORAL_TABLET | Freq: Every day | ORAL | Status: DC
  Administered 2023-09-25: 30 mg via ORAL
  Filled 2023-09-24: qty 1

## 2023-09-24 NOTE — Progress Notes (Signed)
 Progress Note   Patient: Michele Moore FMW:[id number] DOB: 02-Jun-1950 DOA: 09/20/2023     3 DOS: the patient was seen and examined on 09/24/2023   Brief hospital course:  MAYME PROFETA is a 73 y.o. female with medical history significant for HFpEF, stage IIIb chronic kidney disease, asthma, chronic pain, lumbar DJD, GERD, hypertension, hypothyroidism and rheumatoid arthritis, who presented to the emergency room with acute onset of bilateral lower extremity edema with swelling both hands as well as orthopnea and paroxysmal nocturnal dyspnea with dyspnea on exertion over the last several weeks.  The patient has been having significant weight gain during the same period.  He admits to wheezing and occasional cough.  No chest pain or palpitations.  She has been having left shoulder pain and denied any injuries.  No fever or chills.  No dysuria, oliguria or hematuria or flank pain.  No headache or dizziness or blurred vision.  No paresthesias or focal muscle weakness.  No bleeding diathesis.  The patient has a history of bradycardia and has a loop monitor in place   ED Course: When the patient came to the ER, BP was 142/116 with otherwise normal vital signs.  Labs revealed CO2 20 and blood glucose 155, creatinine 2.01 anion gap 16.  High-sensitivity troponin I was 14 and later 15.  BNP was 85.4.  CBC showed hemoglobin 10.1 and hematocrit of 31.8.  TSH was low 0.024 but free T4 was 1.09. EKG as reviewed by me : EKG showed sinus arrhythmia with a rate of 62 and multiform ventricular premature complexes and Q waves inferiorly as well as anteroseptally. Imaging: Portable chest x-ray showed stable cardiomegaly and interval development of central pulmonary vascular congestion without overt pulmonary edema.   The patient was given 80 mg of IV Lasix .  The patient will be admitted to an observation cardiac telemetry bed for further evaluation and management.     Assessment and Plan:  Atypical chest  pain Costochondritis Patient with complaints of right-sided chest pain that is reproducible Twelve-lead EKG is within normal limits Obtain serial cardiac enzymes Continue prednisone      Acute on chronic diastolic dysfunction CHF Improved Patient presented for evaluation of bilateral lower extremity swelling, increased weight gain and orthopnea. Chest x-ray showed cardiomegaly and bilateral pulmonary vascular congestion 2D echocardiogram shows an LVEF of 50 to 55% with mild LVH and grade 1 diastolic dysfunction Continue furosemide  40 mg daily Continue metoprolol  and Jardiance  Losartan  is on hold due to worsening renal function     Hypertension Stage IIIb chronic kidney disease Monitor renal function closely while on diuretics Renal function is stable Hold losartan  due to worsening renal function Continue  metoprolol      Hypothyroidism Continue Synthroid      Migraine headache Improved Continue as needed Fioricet      Anemia of chronic kidney disease Monitor H&H closely     Subjective: Continues to complain of migraine headaches  Physical Exam: Vitals:   09/23/23 2335 09/24/23 0442 09/24/23 0826 09/24/23 1203  BP:  (!) 107/53 107/72 102/66  Pulse:  (!) 58 76 74  Resp:  16    Temp:  98.4 F (36.9 C) 98.2 F (36.8 C) 97.8 F (36.6 C)  TempSrc:  Oral Oral Oral  SpO2:  92% 95% 98%  Weight:  125 kg    Height: 5' 11 (1.803 m)      General: Chronically ill-appearing elderly female, well nourished, in no acute distress. HEENT: Normocephalic and atraumatic. Neck: No JVD.  Lungs: Normal respiratory effort on room air.  Diminished breath sounds bilaterally. Heart: HRRR. Normal S1 and S2 without gallops or murmurs.  Reproducible chest pain Abdomen: Non-distended appearing.  Msk: Normal strength and tone for age. Extremities: Warm and well perfused. No clubbing, cyanosis. + pedal edema.  Neuro: Alert and oriented X 3. Psych: Answers questions appropriately.        Data Reviewed: BUN 43, creatinine 2.16 Labs reviewed  Family Communication: Plan of care discussed with patient in detail.  She verbalizes understanding and agrees with the plan  Disposition: Status is: Inpatient Remains inpatient appropriate because: Discharge planning  Planned Discharge Destination: Home    Time spent: 40 minutes  Author: Aimee Somerset, MD 09/24/2023 2:05 PM  For on call review [email or web address].

## 2023-09-24 NOTE — Plan of Care (Signed)
   Problem: Education: Goal: Knowledge of General Education information will improve Description Including pain rating scale, medication(s)/side effects and non-pharmacologic comfort measures Outcome: Progressing

## 2023-09-24 NOTE — Progress Notes (Signed)
 Mobility Specialist - Progress Note   09/24/23 1528  Mobility  Activity Ambulated independently  Level of Assistance Modified independent, requires aide device or extra time  Assistive Device  (Rollator)  Distance Ambulated (ft) 24 ft  Activity Response Tolerated well  Mobility visit 1 Mobility  Mobility Specialist Start Time (ACUTE ONLY) 1454  Mobility Specialist Stop Time (ACUTE ONLY) 1518  Mobility Specialist Time Calculation (min) (ACUTE ONLY) 24 min   Pt sitting in the recliner upon entry, utilizing RA. Pt expressed having a headache however agreeable to amb to the bathroom to void. Pt STS to walker and amb to/from the bathroom Sup-ModI--- tolerated well. Pt completed peri care and dons gown indep. Pt returned to the recliner, left seated with needs within reach.   America Silvan Mobility Specialist 09/24/23 3:32 PM

## 2023-09-24 NOTE — Plan of Care (Signed)

## 2023-09-25 DIAGNOSIS — R0789 Other chest pain: Secondary | ICD-10-CM | POA: Diagnosis present

## 2023-09-25 DIAGNOSIS — I5033 Acute on chronic diastolic (congestive) heart failure: Secondary | ICD-10-CM | POA: Diagnosis not present

## 2023-09-25 MED ORDER — BUTALBITAL-APAP-CAFFEINE 50-325-40 MG PO TABS: 1.0000 | ORAL_TABLET | Freq: Four times a day (QID) | ORAL | 0 refills | Status: AC | PRN

## 2023-09-25 MED ORDER — HYDROCODONE-ACETAMINOPHEN 7.5-325 MG PO TABS: 1.0000 | ORAL_TABLET | Freq: Three times a day (TID) | ORAL | 0 refills | Status: AC | PRN

## 2023-09-25 MED ORDER — LEVOTHYROXINE SODIUM 100 MCG PO TABS: 100.0000 ug | ORAL_TABLET | Freq: Every day | ORAL | 0 refills | Status: DC

## 2023-09-25 MED ORDER — HYDROCODONE-ACETAMINOPHEN 7.5-325 MG PO TABS
1.0000 | ORAL_TABLET | Freq: Once | ORAL | Status: AC
  Administered 2023-09-25: 1 via ORAL
  Filled 2023-09-25: qty 1

## 2023-09-25 NOTE — Discharge Summary (Signed)
 Physician Discharge Summary   Patient: Michele Moore MRN: [id number] DOB: 1950-12-14  Admit date:     09/20/2023  Discharge date: 09/25/23  Discharge Physician: Lexxie Winberg   PCP: System, Provider Not In   Recommendations at discharge:  Reaso;Check daily weights.  Contact your primary care provider if you gain more than 2 pounds in 24 hours or 5 pounds in 1 week Keep scheduled follow-up appointment with cardiology and neurology  Discharge Diagnoses: Principal Problem:   Acute on chronic diastolic CHF (congestive heart failure) (HCC) Active Problems:   Essential hypertension   Hypothyroidism   Gout   Rheumatoid arthritis (HCC)   Atypical chest pain  Resolved Problems:   * No resolved hospital problems. *  Hospital Course:  Michele Moore is a 73 y.o. female with medical history significant for HFpEF, stage IIIb chronic kidney disease, asthma, chronic pain, lumbar DJD, GERD, hypertension, hypothyroidism and rheumatoid arthritis, who presented to the emergency room with acute onset of bilateral lower extremity edema with swelling both hands as well as orthopnea and paroxysmal nocturnal dyspnea with dyspnea on exertion over the last several weeks.  The patient has been having significant weight gain during the same period.  He admits to wheezing and occasional cough.  No chest pain or palpitations.  She has been having left shoulder pain and denied any injuries.  No fever or chills.  No dysuria, oliguria or hematuria or flank pain.  No headache or dizziness or blurred vision.  No paresthesias or focal muscle weakness.  No bleeding diathesis.  The patient has a history of bradycardia and has a loop monitor in place   ED Course: When the patient came to the ER, BP was 142/116 with otherwise normal vital signs.  Labs revealed CO2 20 and blood glucose 155, creatinine 2.01 anion gap 16.  High-sensitivity troponin I was 14 and later 15.  BNP was 85.4.  CBC showed hemoglobin 10.1 and hematocrit of  31.8.  TSH was low 0.024 but free T4 was 1.09. EKG as reviewed by me : EKG showed sinus arrhythmia with a rate of 62 and multiform ventricular premature complexes and Q waves inferiorly as well as anteroseptally. Imaging: Portable chest x-ray showed stable cardiomegaly and interval development of central pulmonary vascular congestion without overt pulmonary edema.   The patient was given 80 mg of IV Lasix .  The patient will be admitted to an observation cardiac telemetry bed for further evaluation and management.    Assessment and Plan:  Atypical chest pain Costochondritis Patient with complaints of right-sided chest pain that is reproducible Twelve-lead EKG is within normal limits Serial cardiac enzymes are within normal limits Patient received 3 doses of prednisone      Acute on chronic diastolic dysfunction CHF Improved Patient presented for evaluation of bilateral lower extremity swelling, increased weight gain and orthopnea. Chest x-ray showed cardiomegaly and bilateral pulmonary vascular congestion 2D echocardiogram shows an LVEF of 50 to 55% with mild LVH and grade 1 diastolic dysfunction Continue furosemide  40 mg daily Continue metoprolol , losartan  and Jardiance       Hypertension Stage IIIb chronic kidney disease Renal function is stable Continue metoprolol  and losartan      Hypothyroidism Continue Synthroid      Migraine headache Improved Continue as needed Fioricet  Follow-up with neurology     Anemia of chronic kidney disease Monitor H&H closely             Consultants: Cardiology Procedures performed: 2D echocardiogram Disposition: Home Diet recommendation:  Discharge Diet  Orders (From admission, onward)     Start     Ordered   09/25/23 0000  Diet - low sodium heart healthy        09/25/23 1033           Cardiac diet DISCHARGE MEDICATION: Allergies as of 09/25/2023       Reactions   Ampicillin Swelling   Penicillins Anaphylaxis,  Swelling   IgE = 10 (11/05/2017) Has patient had a PCN reaction causing immediate rash, facial/tongue/throat swelling, SOB or lightheadedness with hypotension: Yes Has patient had a PCN reaction causing severe rash involving mucus membranes or skin necrosis: No Has patient had a PCN reaction that required hospitalization: No Has patient had a PCN reaction occurring within the last 10 years: Yes If all of the above answers are NO, then may proceed with Cephalosporin use.   Vancomycin Rash   Other reaction(s): Other (see comments)   Vibramycin  [doxycycline  Calcium ] Rash        Medication List     STOP taking these medications    calcipotriene  0.005 % cream Commonly known as: DOVONOX   clobetasol  ointment 0.05 % Commonly known as: TEMOVATE    diclofenac sodium 1 % Gel Commonly known as: VOLTAREN   DUPIXENT [place]   erythromycin  ophthalmic ointment   Farxiga  10 MG Tabs tablet Generic drug: dapagliflozin  propanediol   ketoconazole  2 % cream Commonly known as: NIZORAL    nystatin  100000 UNIT/ML suspension Commonly known as: MYCOSTATIN    nystatin  cream Commonly known as: MYCOSTATIN    Otezla  30 MG Tabs Generic drug: Apremilast    predniSONE  10 MG (21) Tbpk tablet Commonly known as: STERAPRED UNI-PAK 21 TAB   RINVOQ PO   SYSTANE OP       TAKE these medications    acetaminophen  650 MG CR tablet Commonly known as: TYLENOL  Take 650 mg by mouth every 8 (eight) hours as needed for pain.   albuterol  108 (90 Base) MCG/ACT inhaler Commonly known as: VENTOLIN  HFA Inhale 2 puffs into the lungs every 6 (six) hours as needed for wheezing or shortness of breath.   allopurinol  100 MG tablet Commonly known as: ZYLOPRIM  Take 200 mg by mouth every morning.   betamethasone  dipropionate 0.05 % cream APPLY TO PSORIASIS ON HANDS TWICE DAILY ONLY. AVOID FACE, GROIN AND AXILLA   butalbital -acetaminophen -caffeine  50-325-40 MG tablet Commonly known as: FIORICET  Take 1 tablet by  mouth every 6 (six) hours as needed for up to 10 days for headache or migraine.   cetirizine 10 MG tablet Commonly known as: ZYRTEC Take 10 mg by mouth daily.   fluconazole  200 MG tablet Commonly known as: DIFLUCAN  Take 200 mg by mouth once a week.   furosemide  40 MG tablet Commonly known as: LASIX  Take 40 mg by mouth daily.   gabapentin  600 MG tablet Commonly known as: Neurontin  Take 1 tablet (600 mg total) by mouth at bedtime.   HYDROcodone -acetaminophen  7.5-325 MG tablet Commonly known as: Norco Take 1 tablet by mouth every 8 (eight) hours as needed for up to 3 days for severe pain (pain score 7-10). Must last 30 days.   hydrocortisone  2.5 % ointment Apply topically 2 (two) times daily as needed.   hydrOXYzine  25 MG tablet Commonly known as: ATARAX  Take 25 mg by mouth every 8 (eight) hours as needed.   Jardiance  10 MG Tabs tablet Generic drug: empagliflozin  Take 10 mg by mouth every morning.   levothyroxine  137 MCG tablet Commonly known as: SYNTHROID  TAKE 1 TABLET(137 MCG) BY MOUTH DAILY BEFORE  BREAKFAST   losartan  25 MG tablet Commonly known as: COZAAR  Take 25 mg by mouth daily.   meclizine  12.5 MG tablet Commonly known as: ANTIVERT  TAKE 1 TABLET(12.5 MG) BY MOUTH THREE TIMES DAILY AS NEEDED FOR DIZZINESS   metoprolol  tartrate 25 MG tablet Commonly known as: LOPRESSOR  Take 1 tablet (25 mg total) by mouth 2 (two) times daily.   montelukast  10 MG tablet Commonly known as: SINGULAIR  TAKE 1 TABLET(10 MG) BY MOUTH AT BEDTIME   mupirocin  ointment 2 % Commonly known as: Bactroban  Apply 1 application topically 2 (two) times daily.   omega-3 acid ethyl esters 1 g capsule Commonly known as: LOVAZA  Take 1 capsule (1 g total) by mouth 2 (two) times daily.   omeprazole  40 MG capsule Commonly known as: PRILOSEC TAKE 1 CAPSULE(40 MG) BY MOUTH DAILY   ONE-A-DAY WOMENS FORMULA PO Take 1 tablet by mouth daily.   potassium chloride  SA 20 MEQ tablet Commonly known  as: KLOR-CON  M Take 1 tablet (20 mEq total) by mouth daily.   tiZANidine  4 MG tablet Commonly known as: ZANAFLEX  Take 1 tablet (4 mg total) by mouth at bedtime.   triamcinolone  cream 0.1 % Commonly known as: KENALOG  Apply 1 application topically 2 (two) times daily.   Vitamin D3 50 MCG (2000 UT) Tabs Take 2,000 Units by mouth daily.        Follow-up Information     Buford, Caralyn, PA-C. Go in 4 day(s).   Specialty: Cardiology Why: [facility] on 09/29/2023 at 2 PM Contact information: [address] [place] [address] [phone]         Florencio Cara BIRCH, MD Follow up in 2 week(s).   Specialties: Cardiology, Internal Medicine Why: Hospital Follow up: August 8th @ 8:30 AM. Please arrive 15 minutes early. Contact information: [address] [place] [address] [phone]         Maree Jannett POUR, MD Follow up in 2 week(s).   Specialty: Neurology Why: Migraine headaches  Please call to set up appointment Contact information: [address] [facility] [address] [facility] [place] [address] [phone]                Discharge Exam: Filed Weights   09/23/23 0600 09/24/23 0442 09/25/23 0500  Weight: 121.5 kg 125 kg 128.4 kg   General: Chronically ill-appearing elderly female, well nourished, in no acute distress. HEENT: Normocephalic and atraumatic. Neck: No JVD.   Lungs: Normal respiratory effort on room air.  Diminished breath sounds bilaterally. Heart: HRRR. Normal S1 and S2 without gallops or murmurs.  Reproducible chest pain Abdomen: Non-distended appearing.  Msk: Normal strength and tone for age. Extremities: Warm and well perfused. No clubbing, cyanosis. + pedal edema.  Neuro: Alert and oriented X 3. Psych: Answers questions appropriately.     Condition at discharge: stable ?  The results of significant diagnostics from this hospitalization (including imaging, microbiology,  ancillary and laboratory) are listed below for reference.   Imaging Studies: CUP PACEART REMOTE DEVICE CHECK Result Date: 09/21/2023 ILR summary report received. Battery status OK. Normal device function. No new symptom, tachy, brady, or pause episodes. No new AF episodes, cardiac compass suggestive of atrial arrhythmia not meeting detection criteria of 6 min, not on OAC per Epic. Monthly summary reports and ROV/PRN. MC, CVRS  ECHOCARDIOGRAM COMPLETE Result Date: 09/21/2023    ECHOCARDIOGRAM REPORT   Patient Name:   Michele Moore Date of Exam: 09/21/2023 Medical Rec #:  [id number]  Height:       71.0 in Accession #:    [id number]   Weight:       283.0 lb Date of Birth:  [birth year]    BSA:          2.444 m Patient Age:    72 years     BP:           150/87 mmHg Patient Gender: F            HR:           74 bpm. Exam Location:  [facility] Procedure: 2D Echo, Cardiac Doppler, Color Doppler and Intracardiac            Opacification Agent (Both Spectral and Color Flow Doppler were            utilized during procedure). Indications:     CHF-Acute Diastolic I50.31  History:         Patient has prior history of Echocardiogram examinations, most                  recent 10/01/2018.  Sonographer:     Ashley McNeely-Sloane Referring Phys:  [id number] JAN A MANSY Diagnosing Phys: Keller Alluri IMPRESSIONS  1. Left ventricular ejection fraction, by estimation, is 50 to 55%. The left ventricle has low normal function. The left ventricle has no regional wall motion abnormalities. There is mild left ventricular hypertrophy. Left ventricular diastolic parameters are consistent with Grade I diastolic dysfunction (impaired relaxation).  2. Right ventricular systolic function is normal. The right ventricular size is normal.  3. The mitral valve is normal in structure. Trivial mitral valve regurgitation.  4. The aortic valve was not well visualized. There is mild thickening of the aortic valve. Aortic valve regurgitation is not visualized.  FINDINGS  Left Ventricle: Left ventricular ejection fraction, by estimation, is 50 to 55%. The left ventricle has low normal function. The left ventricle has no regional wall motion abnormalities. Definity  contrast agent was given IV to delineate the left ventricular endocardial borders. The left ventricular internal cavity size was normal in size. There is mild left ventricular hypertrophy. Left ventricular diastolic parameters are consistent with Grade I diastolic dysfunction (impaired relaxation). Right Ventricle: The right ventricular size is normal. No increase in right ventricular wall thickness. Right ventricular systolic function is normal. Left Atrium: Left atrial size was normal in size. Right Atrium: Right atrial size was normal in size. Pericardium: There is no evidence of pericardial effusion. Mitral Valve: The mitral valve is normal in structure. Trivial mitral valve regurgitation. MV peak gradient, 4.3 mmHg. The mean mitral valve gradient is 1.0 mmHg. Tricuspid Valve: The tricuspid valve is not well visualized. Tricuspid valve regurgitation is trivial. Aortic Valve: The aortic valve was not well visualized. There is mild thickening of the aortic valve. Aortic valve regurgitation is not visualized. Aortic valve mean gradient measures 4.0 mmHg. Aortic valve peak gradient measures 6.9 mmHg. Aortic valve area, by VTI measures 4.34 cm. Pulmonic Valve: The pulmonic valve was not well visualized. Pulmonic valve regurgitation is not visualized. Aorta: The aortic root and ascending aorta are structurally normal, with no evidence of dilitation. IAS/Shunts: The atrial septum is grossly normal.  LEFT VENTRICLE PLAX 2D LVIDd:         4.70 cm     Diastology LVIDs:         3.50 cm     LV e' medial:    5.82 cm/s LV PW:  1.20 cm     LV E/e' medial:  11.0 LV IVS:        1.00 cm     LV e' lateral:   11.00 cm/s LVOT diam:     2.20 cm     LV E/e' lateral: 5.8 LV SV:         103 LV SV Index:   42 LVOT Area:      3.80 cm  LV Volumes (MOD) LV vol d, MOD A2C: 68.9 ml LV vol d, MOD A4C: 89.2 ml LV vol s, MOD A2C: 44.9 ml LV vol s, MOD A4C: 41.3 ml LV SV MOD A2C:     24.0 ml LV SV MOD A4C:     89.2 ml LV SV MOD BP:      34.2 ml RIGHT VENTRICLE RV S prime:     20.00 cm/s TAPSE (M-mode): 3.5 cm LEFT ATRIUM           Index        RIGHT ATRIUM          Index LA diam:      4.30 cm 1.76 cm/m   RA Area:     9.49 cm LA Vol (A4C): 46.8 ml 19.15 ml/m  RA Volume:   16.50 ml 6.75 ml/m  AORTIC VALVE                    PULMONIC VALVE AV Area (Vmax):    3.89 cm     PV Vmax:        1.18 m/s AV Area (Vmean):   3.93 cm     PV Vmean:       78.550 cm/s AV Area (VTI):     4.34 cm     PV VTI:         0.216 m AV Vmax:           131.00 cm/s  PV Peak grad:   5.6 mmHg AV Vmean:          87.300 cm/s  PV Mean grad:   3.0 mmHg AV VTI:            0.238 m      RVOT Peak grad: 4 mmHg AV Peak Grad:      6.9 mmHg AV Mean Grad:      4.0 mmHg LVOT Vmax:         134.00 cm/s LVOT Vmean:        90.300 cm/s LVOT VTI:          0.272 m LVOT/AV VTI ratio: 1.14  AORTA Ao Root diam: 3.40 cm Ao Asc diam:  3.40 cm MITRAL VALVE MV Area (PHT): 2.83 cm    SHUNTS MV Area VTI:   4.13 cm    Systemic VTI:  0.27 m MV Peak grad:  4.3 mmHg    Systemic Diam: 2.20 cm MV Mean grad:  1.0 mmHg    Pulmonic VTI:  0.185 m MV Vmax:       1.04 m/s MV Vmean:      44.4 cm/s MV Decel Time: 268 msec MV E velocity: 63.90 cm/s MV A velocity: 89.70 cm/s MV E/A ratio:  0.71 Keller Paterson Electronically signed by Keller Paterson Signature Date/Time: 09/21/2023/5:54:55 PM    Final    US  Venous Img Lower Unilateral Right (DVT) Result Date: 09/21/2023 CLINICAL DATA:  Swelling, pain EXAM: RIGHT LOWER EXTREMITY VENOUS DOPPLER ULTRASOUND TECHNIQUE: Gray-scale sonography with compression, as well as color and duplex ultrasound,  were performed to evaluate the deep venous system(s) from the level of the common femoral vein through the popliteal and proximal calf veins. COMPARISON:  07/21/2023  bilateral FINDINGS: VENOUS Normal compressibility of the common femoral, superficial femoral, and popliteal veins, as well as the visualized calf veins. Visualized portions of profunda femoral vein and great saphenous vein unremarkable. No filling defects to suggest DVT on grayscale or color Doppler imaging. Doppler waveforms show normal direction of venous flow, normal respiratory plasticity and response to augmentation. Limited views of the contralateral common femoral vein are unremarkable. OTHER None. Limitations: none IMPRESSION: Negative. Electronically Signed   By: JONETTA Faes M.D.   On: 09/21/2023 11:13   DG Chest 1 View Result Date: 09/20/2023 CLINICAL DATA:  chf swelling EXAM: CHEST  1 VIEW COMPARISON:  03/15/2023 FINDINGS: Lungs are clear. No pneumothorax or pleural effusion. Stable cardiomegaly when accounting for changes in radiographic technique and rotation. Interval development of a central pulmonary vascular congestion without superimposed overt pulmonary edema in keeping with changes early cardiogenic failure or volume overload. Implanted loop recorder noted. No acute bone abnormality. IMPRESSION: 1. Stable cardiomegaly. 2. Interval development of central pulmonary vascular congestion without overt pulmonary edema. Electronically Signed   By: Dorethia Molt M.D.   On: 09/20/2023 23:29    Microbiology: Results for orders placed or performed during the hospital encounter of 10/09/21  Aerobic/Anaerobic Culture w Gram Stain (surgical/deep wound)     Status: None   Collection Time: 10/09/21  1:47 PM   Specimen: PATH Other; Tissue  Result Value Ref Range Status   Specimen Description   Final    FLUID SYNOVIAL Performed at [facility], [address] [place], [place] [address]    Special Requests   Final    NONE Performed at [facility], [address] [place], [place] [address]    Gram Stain   Final    RARE WBC PRESENT,BOTH PMN AND MONONUCLEAR NO ORGANISMS  SEEN    Culture   Final    No growth aerobically or anaerobically. Performed at [facility], [address], [place], [place] [address]    Report Status 10/14/2021 FINAL  Final    Labs: CBC: Recent Labs  Lab 09/20/23 2243 09/21/23 0518 09/22/23 0501  WBC 4.6 4.5 4.7  HGB 10.1* 10.0* 10.8*  HCT 31.8* 30.1* 32.8*  MCV 93.5 91.5 89.9  PLT 163 155 168   Basic Metabolic Panel: Recent Labs  Lab 09/20/23 2243 09/21/23 0518 09/22/23 0501 09/23/23 0345 09/24/23 0254  NA 144 144 141 135 135  K 4.8 3.3* 3.5 3.9 3.9  CL 108 105 100 98 101  CO2 20* 26 30 28 25   GLUCOSE 155* 107* 117* 152* 119*  BUN 23 23 27* 35* 43*  CREATININE 2.01* 1.90* 2.11* 2.20* 2.16*  CALCIUM  10.2 10.1 9.9 9.4 9.3   Liver Function Tests: No results for input(s): AST, ALT, ALKPHOS, BILITOT, PROT, ALBUMIN in the last 168 hours. CBG: No results for input(s): GLUCAP in the last 168 hours.  Discharge time spent: greater than 30 minutes.  Signed: Aimee Somerset, MD [facility] 09/25/2023

## 2023-09-25 NOTE — Progress Notes (Signed)
 Mobility Specialist - Progress Note   09/25/23 1140  Mobility  Activity Ambulated independently  Level of Assistance Independent  Assistive Device  (rollator)  Distance Ambulated (ft) 30 ft  Activity Response Tolerated well  Mobility visit 1 Mobility  Mobility Specialist Start Time (ACUTE ONLY) 1103  Mobility Specialist Stop Time (ACUTE ONLY) 1115  Mobility Specialist Time Calculation (min) (ACUTE ONLY) 12 min   America Silvan Mobility Specialist 09/25/23 11:41 AM

## 2023-09-25 NOTE — Plan of Care (Signed)
  Problem: Education: Goal: Knowledge of General Education information will improve Description: Including pain rating scale, medication(s)/side effects and non-pharmacologic comfort measures Outcome: Progressing   Problem: Health Behavior/Discharge Planning: Goal: Ability to manage health-related needs will improve Outcome: Progressing   Problem: Clinical Measurements: Goal: Ability to maintain clinical measurements within normal limits will improve Outcome: Progressing   Problem: Skin Integrity: Goal: Risk for impaired skin integrity will decrease Outcome: Progressing   

## 2023-09-28 ENCOUNTER — Telehealth: Payer: Self-pay | Admitting: *Deleted

## 2023-09-28 ENCOUNTER — Telehealth: Payer: Self-pay

## 2023-09-28 NOTE — Telephone Encounter (Signed)
 Copied from CRM [id number]. Topic: General - Other >> Sep 28, 2023 12:47 PM Cleave MATSU wrote: Reason for CRM: pt called back due to missed call please follow back up with pt.

## 2023-09-28 NOTE — Transitions of Care (Post Inpatient/ED Visit) (Signed)
 09/28/2023  Name: Michele Moore MRN: [id number] DOB: 1950-11-02  Today's TOC FU Call Status: Today's TOC FU Call Status:: Successful TOC FU Call Completed TOC FU Call Complete Date: 09/28/23 Patient's Name and Date of Birth confirmed.  Transition Care Management Follow-up Telephone Call Date of Discharge: 09/27/23 Discharge Facility: [facility]) Type of Discharge: Inpatient Admission Primary Inpatient Discharge Diagnosis:: CHF exacerbation How have you been since you were released from the hospital?: Same (You called me!  Why are you asking me all these questions?  You ought to know the answers- you are the one who called me!  I am not reviewing my medicine with anyone right now, I am not wanting to do that.  I don't feel up to it) Any questions or concerns?: No  Patient irritated by TOC call; argumentative intermittently throughout call; unable to full engage patient/ difficult to converse with patient as she makes it clear that she prefers to the talking and does not want a 2-way conversation today  Items Reviewed: Did you receive and understand the discharge instructions provided?: Yes (Briefly reviewed with patient who verbalizes fair understanding of same - patient is not engaged in [facility] call and declines full review) Medications obtained,verified, and reconciled?: No (reports self-manages medications and denies questions/ concerns around medications today) Medications Not Reviewed Reasons:: Other: (Patient very irritated by TOC call; difficult to engage; adamantly declines all aspects of medication review today) Any new allergies since your discharge?: No Dietary orders reviewed?: No (Patient declined) Do you have support at home?: Yes People in Home [RPT]: alone Name of Support/Comfort Primary Source: Reports independent in self-care activities; resides alone; reports neighbors and family assists as/ if needed/ indicated  Medications Reviewed  Today: Medications Reviewed Today     Reviewed by Kalieb Freeland M, RN (Registered Nurse) on 09/28/23 at 1542  Med List Status: <None>   Medication Order Taking? Sig Documenting Provider Last Dose Status Informant  acetaminophen  (TYLENOL ) 650 MG CR tablet [id number] No Take 650 mg by mouth every 8 (eight) hours as needed for pain. [provider] Taking Active Self, Pharmacy Records  albuterol  (VENTOLIN  HFA) 108 [phone] Base) MCG/ACT inhaler [id number] No Inhale 2 puffs into the lungs every 6 (six) hours as needed for wheezing or shortness of breath. Floy Roberts, MD Taking Active Self, Pharmacy Records  allopurinol  (ZYLOPRIM ) 100 MG tablet [id number] No Take 200 mg by mouth every morning.  [provider] Taking Active Self, Pharmacy Records  betamethasone  dipropionate 0.05 % cream [id number] No APPLY TO PSORIASIS ON HANDS TWICE DAILY ONLY. AVOID FACE, GROIN AND AXILLA Kowalski, David C, MD 09/20/2023 Active Self, Pharmacy Records  butalbital -acetaminophen -caffeine  (FIORICET ) 50-325-40 MG tablet [id number]  Take 1 tablet by mouth every 6 (six) hours as needed for up to 10 days for headache or migraine. Lanetta Lingo, MD  Active   cetirizine (ZYRTEC) 10 MG tablet [id number] No Take 10 mg by mouth daily. [provider] Taking Active Self, Pharmacy Records  Cholecalciferol  (VITAMIN D3) 2000 units TABS [id number] No Take 2,000 Units by mouth daily. [provider] 09/20/2023 Active Self, Pharmacy Records  fluconazole  (DIFLUCAN ) 200 MG tablet [id number] No Take 200 mg by mouth once a week. [provider] Taking Active Self, Pharmacy Records  Discontinued 04/24/19 1340   furosemide  (LASIX ) 40 MG tablet [id number] No Take 40 mg by mouth daily. [provider] 09/20/2023 Active Self, Pharmacy Records  gabapentin  (NEURONTIN ) 600 MG tablet [id number] No Take 1 tablet (600 mg total)  by mouth at bedtime. Marcelino Nurse, MD Taking Active Self, Pharmacy Records   HYDROcodone -acetaminophen  [facility]) 7.5-325 MG tablet [id number]  Take 1 tablet by mouth every 8 (eight) hours as needed for up to 3 days for severe pain (pain score 7-10). Must last 30 days. Lanetta Lingo, MD  Active   hydrocortisone  2.5 % ointment [id number] No Apply topically 2 (two) times daily as needed. [provider] Taking Active Self, Pharmacy Records  hydrOXYzine  (ATARAX ) 25 MG tablet [id number]  Take 25 mg by mouth every 8 (eight) hours as needed. [provider]  Active Self, Pharmacy Records  JARDIANCE  10 MG TABS tablet [id number] No Take 10 mg by mouth every morning. [provider] 09/20/2023 Active Self, Pharmacy Records  levothyroxine  (SYNTHROID ) 100 MCG tablet [id number]  Take 1 tablet (100 mcg total) by mouth daily at 6 (six) AM. Agbata, Tochukwu, MD  Active   losartan  (COZAAR ) 25 MG tablet [id number] No Take 25 mg by mouth daily. [provider] 09/20/2023 Active Self, Pharmacy Records  meclizine  (ANTIVERT ) 12.5 MG tablet [id number] No TAKE 1 TABLET(12.5 MG) BY MOUTH THREE TIMES DAILY AS NEEDED FOR DIZZINESS Jones, Deanna C, MD Taking Active Self, Pharmacy Records  metoprolol  tartrate (LOPRESSOR ) 25 MG tablet [id number] No Take 1 tablet (25 mg total) by mouth 2 (two) times daily. Joshua Cathryne BROCKS, MD 09/20/2023 Morning Active Self, Pharmacy Records    Discontinued 04/24/19 1340   montelukast  (SINGULAIR ) 10 MG tablet [id number] No TAKE 1 TABLET(10 MG) BY MOUTH AT BEDTIME Lemon Raisin, MD 09/19/2023 Active Self, Pharmacy Records  Multiple Vitamins-Calcium  (ONE-A-DAY WOMENS FORMULA PO) [id number] No Take 1 tablet by mouth daily. [provider] Taking Active Self, Pharmacy Records  mupirocin  ointment (BACTROBAN ) 2 % [id number] No Apply 1 application topically 2 (two) times daily. Joshua Cathryne BROCKS, MD Taking Active Self, Pharmacy Records  omega-3 acid ethyl esters (LOVAZA ) 1 g capsule [id number] No Take 1 capsule (1 g total) by mouth 2 (two) times daily.  Joshua Cathryne BROCKS, MD 09/20/2023 Active Self, Pharmacy Records  omeprazole  [facility]) 40 MG capsule [id number] No TAKE 1 CAPSULE(40 MG) BY MOUTH DAILY Jones, Deanna C, MD 09/20/2023 Active Self, Pharmacy Records  potassium chloride  SA (KLOR-CON  M) 20 MEQ tablet [id number] No Take 1 tablet (20 mEq total) by mouth daily. Joshua Cathryne BROCKS, MD 09/20/2023 Active Self, Pharmacy Records  tiZANidine  (ZANAFLEX ) 4 MG tablet [id number] No Take 1 tablet (4 mg total) by mouth at bedtime. Marcelino Nurse, MD Taking Active Self, Pharmacy Records  triamcinolone  (KENALOG ) 0.1 % [id number] No Apply 1 application topically 2 (two) times daily.  Patient not taking: Reported on 09/21/2023   Joshua Cathryne BROCKS, MD Not Taking Active Self, Pharmacy Records  Med List Note Wendelyn Pulling, RN 07/09/23 1520): UDS 07/09/23 MR 10/18/23 Medication agreement signed 10/28/2016           Home Care and Equipment/Supplies: Were Home Health Services Ordered?: No Any new equipment or medical supplies ordered?: No  Functional Questionnaire: Do you need assistance with bathing/showering or dressing?: No Do you need assistance with meal preparation?: No Do you need assistance with eating?: No Do you have difficulty maintaining continence: No Do you need assistance with getting out of bed/getting out of a chair/moving?: No Do you have difficulty managing or taking your medications?: No  Follow up appointments reviewed: PCP Follow-up appointment confirmed?: Yes Date of PCP follow-up appointment?: 09/30/23 Follow-up Provider: PCP Specialist Hospital Follow-up appointment confirmed?: Yes Date of Specialist follow-up appointment?: 10/06/23 Follow-Up Specialty Provider:: Cardiology provider  Do you need transportation to your follow-up appointment?: No Do you understand care options if your condition(s) worsen?: Yes-patient verbalized understanding  SDOH Interventions Today    Flowsheet Row Most Recent Value  SDOH Interventions   Food  Insecurity Interventions Intervention Not Indicated  [Today patient denies food insecurity during TOC call: reports gets money for food as part of her health insurance plan]  Housing Interventions Intervention Not Indicated  Transportation Interventions Intervention Not Indicated  [reports drives self]  Utilities Interventions Intervention Not Indicated   See TOC assessment tabs for additional assessment/ TOC intervention information  Patient declines need for ongoing/ further care management/ coordination outreach; declines enrollment in 30-day TOC program- declines taking my direct phone number should needs/ concerns arise post-TOC call: difficult to engage patient throughout [facility] call  Pls call/ message for questions,  Tryson Lumley Mckinney Shila Kruczek, RN, BSN, Media planner  Transitions of Care  [facility] - [facility] [phone]: direct office

## 2023-09-28 NOTE — Transitions of Care (Post Inpatient/ED Visit) (Signed)
   09/28/2023  Name: Michele Moore MRN: [id number] DOB: 1950-06-01  Today's TOC FU Call Status: Today's TOC FU Call Status:: Unsuccessful Call (1st Attempt) Unsuccessful Call (1st Attempt) Date: 09/28/23  Attempted to reach the patient regarding the most recent Inpatient visit.  Left HIPAA compliant voice message requesting call back  Follow Up Plan: Additional outreach attempts will be made to reach the patient to complete the Transitions of Care (Post Inpatient/ED visit) call.   Pls call/ message for questions,  Kyndall Chaplin Mckinney Maurice Fotheringham, RN, BSN, CCRN Alumnus RN Care Manager  Transitions of Care  [facility] - [facility] [phone]: direct office

## 2023-09-30 ENCOUNTER — Encounter: Payer: Self-pay | Admitting: Student

## 2023-09-30 ENCOUNTER — Ambulatory Visit (INDEPENDENT_AMBULATORY_CARE_PROVIDER_SITE_OTHER): Payer: 59 | Admitting: Student

## 2023-09-30 VITALS — BP 138/76 | HR 79 | Ht 71.0 in | Wt 281.1 lb

## 2023-09-30 DIAGNOSIS — I5033 Acute on chronic diastolic (congestive) heart failure: Secondary | ICD-10-CM

## 2023-09-30 DIAGNOSIS — R300 Dysuria: Secondary | ICD-10-CM

## 2023-09-30 LAB — POCT URINALYSIS DIPSTICK
Bilirubin, UA: NEGATIVE
Blood, UA: NEGATIVE
Glucose, UA: NEGATIVE
Ketones, UA: NEGATIVE
Nitrite, UA: NEGATIVE
Protein, UA: NEGATIVE
Spec Grav, UA: 1.01 (ref 1.010–1.025)
Urobilinogen, UA: 0.2 U/dL
pH, UA: 6 (ref 5.0–8.0)

## 2023-09-30 MED ORDER — FUROSEMIDE 40 MG PO TABS
40.0000 mg | ORAL_TABLET | Freq: Two times a day (BID) | ORAL | 0 refills | Status: DC
Start: 2023-09-30 — End: 2023-12-18

## 2023-09-30 NOTE — Patient Instructions (Signed)
 Please bring medicine to next appointment

## 2023-09-30 NOTE — Progress Notes (Unsigned)
 Established Patient Office Visit  Subjective   Patient ID: Michele Moore, female    DOB: Jun 10, 1950  Age: 73 y.o. MRN: [id number]  Chief Complaint  Patient presents with   Hospitalization Follow-up   Weight down  Taking furosemide  twice day   Patient Active Problem List   Diagnosis Date Noted   Atypical chest pain 09/25/2023   Acute on chronic diastolic CHF (congestive heart failure) (HCC) 09/21/2023   Essential hypertension 09/21/2023   Gout 09/21/2023   Skin candidiasis 07/15/2023   Routine adult health maintenance 07/15/2023   Swelling of right lower extremity 07/15/2023   Burn    Need for prophylactic vaccination and inoculation against influenza 12/03/2022   Angiodysplasia of stomach and duodenum with hemorrhage 08/14/2022   Skin rash 08/04/2022   Thrush 08/04/2022   Symptomatic anemia 12/18/2021   Rheumatoid arthritis (HCC) 12/18/2021   S/P revision of total knee 10/09/2021   Chronic instability of left knee 02/12/2021   Long term methotrexate  user 01/03/2021   Stage 3b chronic kidney disease (HCC) 01/03/2021   Piriformis syndrome of left side 11/15/2020   Normocytic anemia 04/22/2020   Lumbar spondylosis 02/16/2020   Arthritis of left knee 11/24/2019   Left hip pain 08/08/2019   Primary osteoarthritis of left hip 08/08/2019   Non-seasonal allergic rhinitis 04/12/2019   Bursitis of left shoulder 03/24/2019   Benign essential hypertension 03/06/2019   Proteinuria 03/06/2019   Trochanteric bursitis of left hip 10/04/2018   SI joint arthritis (left) 10/04/2018   Exertional chest pain 09/30/2018   CKD (chronic kidney disease), stage III (HCC) 09/30/2018   Psoriatic arthritis (HCC) 04/23/2018   Trigger ring finger of left hand 04/14/2018   Female pelvic pain 01/20/2018   History of revision of total knee arthroplasty 01/20/2018   Iron  deficiency anemia    Heme + stool    Acute gastritis without hemorrhage    Gastric polyps    Benign neoplasm of ascending  colon    Benign neoplasm of transverse colon    Polyp of sigmoid colon    Hypertension 10/01/2017   Hypothyroidism 10/01/2017   Gastroesophageal reflux disease 10/01/2017   Hypokalemia 10/01/2017   Mixed hyperlipidemia 10/01/2017   Reactive airway disease 10/01/2017   Bradycardia 08/24/2017   Severe obesity (BMI 35.0-35.9 with comorbidity) (HCC) 08/18/2017   Chronic gouty arthropathy without tophi 06/25/2017   Encounter for long-term (current) use of high-risk medication 06/25/2017   Positive ANA (antinuclear antibody) 06/17/2017   Cervical facet syndrome 06/16/2017   Primary osteoarthritis of right shoulder 06/16/2017   Sprain of anterior talofibular ligament of right ankle 06/16/2017   Lumbar radiculopathy 04/21/2017   Lumbar degenerative disc disease 04/21/2017   Chronic pain syndrome 04/21/2017   Spinal stenosis, lumbar region, with neurogenic claudication 04/21/2017   SS-A antibody positive 03/05/2017   Chronic knee pain after total replacement of left knee joint 08/03/2015   DOE (dyspnea on exertion) 12/28/2013      ROS Refer to HPI    Objective:     BP 138/76   Pulse 79   Ht 5' 11 (1.803 m)   Wt 281 lb 2 oz (127.5 kg)   SpO2 97%   BMI 39.21 kg/m  BP Readings from Last 3 Encounters:  09/30/23 138/76  09/25/23 (!) 135/107  07/15/23 (!) 152/80    Physical Exam     07/09/2023    2:50 PM 04/14/2023    2:51 PM 04/02/2023    3:03 PM  Depression screen PHQ 2/9  Decreased Interest 0 0 0  Down, Depressed, Hopeless 0 0 0  PHQ - 2 Score 0 0 0  Altered sleeping   0  Tired, decreased energy   0  Change in appetite   0  Feeling bad or failure about yourself    0  Trouble concentrating   0  Moving slowly or fidgety/restless   0  Suicidal thoughts   0  PHQ-9 Score   0  Difficult doing work/chores   Not difficult at all       04/02/2023    3:03 PM 09/25/2022    3:17 PM 03/27/2022    3:33 PM 11/25/2021    2:52 PM  GAD 7 : Generalized Anxiety Score  Nervous,  Anxious, on Edge 0 0 0 0  Control/stop worrying 0 0 0 0  Worry too much - different things 0 0 0 0  Trouble relaxing 0 0 0 0  Restless 0 0 0 0  Easily annoyed or irritable 0 0 0 0  Afraid - awful might happen 0 0 0 0  Total GAD 7 Score 0 0 0 0  Anxiety Difficulty Not difficult at all Not difficult at all Not difficult at all     No results found for any visits on 09/30/23.  Last CBC Lab Results  Component Value Date   WBC 4.7 09/22/2023   HGB 10.8 (L) 09/22/2023   HCT 32.8 (L) 09/22/2023   MCV 89.9 09/22/2023   MCH 29.6 09/22/2023   RDW 15.1 09/22/2023   PLT 168 09/22/2023   Last metabolic panel Lab Results  Component Value Date   GLUCOSE 119 (H) 09/24/2023   NA 135 09/24/2023   K 3.9 09/24/2023   CL 101 09/24/2023   CO2 25 09/24/2023   BUN 43 (H) 09/24/2023   CREATININE 2.16 (H) 09/24/2023   GFRNONAA 23 (L) 09/24/2023   CALCIUM  9.3 09/24/2023   PHOS 3.0 09/25/2022   PROT 6.7 04/02/2023   ALBUMIN 4.3 04/02/2023   LABGLOB 2.4 04/02/2023   AGRATIO 1.7 07/01/2022   BILITOT 0.2 04/02/2023   ALKPHOS 104 04/02/2023   AST 29 04/02/2023   ALT 14 04/02/2023   ANIONGAP 9 09/24/2023      The 10-year ASCVD risk score (Arnett DK, et al., 2019) is: 36%    Assessment & Plan:  There are no diagnoses linked to this encounter.   No follow-ups on file.    Harlene Saddler, MD

## 2023-10-01 LAB — MICROSCOPIC EXAMINATION
Casts: NONE SEEN /LPF
RBC, Urine: NONE SEEN /HPF (ref 0–2)

## 2023-10-01 LAB — URINALYSIS, ROUTINE W REFLEX MICROSCOPIC
Bilirubin, UA: NEGATIVE
Ketones, UA: NEGATIVE
Nitrite, UA: NEGATIVE
Protein,UA: NEGATIVE
RBC, UA: NEGATIVE
Specific Gravity, UA: 1.014 (ref 1.005–1.030)
Urobilinogen, Ur: 0.2 mg/dL (ref 0.2–1.0)
pH, UA: 6.5 (ref 5.0–7.5)

## 2023-10-01 LAB — SPECIMEN STATUS REPORT

## 2023-10-01 NOTE — Assessment & Plan Note (Signed)
 Discharged on 8/1 for acute heart failure exacerbation weight was 283 on the day of discharge however on 7/31 weight was 275.  Weight today  is 281.  Overall seems weight has been increasing prior to recent admission unclear what her dry weight is.  She does appear fluid overloaded today with lower extremity edema up to the knees.  Furosemide  was increased from 40 mg daily to twice daily yesterday will have her continue this.  She is unable to monitor her weight at home as she does not have a scale.  She has follow-up with cardiology next week.  Will defer BMP until her cardiology follow-up as she only increased her Lasix  yesterday.  ED precautions given if increased dose of Lasix  is not significantly improving her edema.

## 2023-10-04 ENCOUNTER — Ambulatory Visit: Payer: Self-pay | Admitting: Cardiology

## 2023-10-04 LAB — URINE CULTURE

## 2023-10-05 ENCOUNTER — Ambulatory Visit: Payer: Self-pay | Admitting: Student

## 2023-10-05 MED ORDER — SULFAMETHOXAZOLE-TRIMETHOPRIM 800-160 MG PO TABS: 1.0000 | ORAL_TABLET | Freq: Two times a day (BID) | ORAL | 0 refills | Status: AC

## 2023-10-07 ENCOUNTER — Inpatient Hospital Stay

## 2023-10-07 ENCOUNTER — Inpatient Hospital Stay: Attending: Oncology

## 2023-10-07 DIAGNOSIS — D631 Anemia in chronic kidney disease: Secondary | ICD-10-CM | POA: Diagnosis present

## 2023-10-07 DIAGNOSIS — N1832 Chronic kidney disease, stage 3b: Secondary | ICD-10-CM | POA: Diagnosis present

## 2023-10-07 LAB — HEMOGLOBIN AND HEMATOCRIT (CANCER CENTER ONLY)
HCT: 31.5 % — ABNORMAL LOW (ref 36.0–46.0)
Hemoglobin: 10.2 g/dL — ABNORMAL LOW (ref 12.0–15.0)

## 2023-10-07 NOTE — Progress Notes (Signed)
 Hgb 10.2 today, no inj given

## 2023-10-13 ENCOUNTER — Other Ambulatory Visit: Payer: Self-pay | Admitting: Nurse Practitioner

## 2023-10-13 ENCOUNTER — Ambulatory Visit: Attending: Nurse Practitioner | Admitting: Nurse Practitioner

## 2023-10-13 ENCOUNTER — Encounter: Payer: Self-pay | Admitting: Nurse Practitioner

## 2023-10-13 VITALS — BP 152/84 | HR 66 | Temp 97.8°F | Ht 71.0 in | Wt 280.0 lb

## 2023-10-13 DIAGNOSIS — Z79899 Other long term (current) drug therapy: Secondary | ICD-10-CM | POA: Insufficient documentation

## 2023-10-13 DIAGNOSIS — M4726 Other spondylosis with radiculopathy, lumbar region: Secondary | ICD-10-CM | POA: Diagnosis not present

## 2023-10-13 DIAGNOSIS — M47816 Spondylosis without myelopathy or radiculopathy, lumbar region: Secondary | ICD-10-CM | POA: Insufficient documentation

## 2023-10-13 DIAGNOSIS — M5136 Other intervertebral disc degeneration, lumbar region with discogenic back pain only: Secondary | ICD-10-CM | POA: Diagnosis present

## 2023-10-13 DIAGNOSIS — G894 Chronic pain syndrome: Secondary | ICD-10-CM | POA: Diagnosis present

## 2023-10-13 DIAGNOSIS — M5416 Radiculopathy, lumbar region: Secondary | ICD-10-CM | POA: Diagnosis present

## 2023-10-13 DIAGNOSIS — M47812 Spondylosis without myelopathy or radiculopathy, cervical region: Secondary | ICD-10-CM | POA: Insufficient documentation

## 2023-10-13 DIAGNOSIS — M1612 Unilateral primary osteoarthritis, left hip: Secondary | ICD-10-CM | POA: Insufficient documentation

## 2023-10-13 DIAGNOSIS — G5702 Lesion of sciatic nerve, left lower limb: Secondary | ICD-10-CM | POA: Diagnosis present

## 2023-10-13 DIAGNOSIS — Z96652 Presence of left artificial knee joint: Secondary | ICD-10-CM | POA: Diagnosis present

## 2023-10-13 DIAGNOSIS — M25562 Pain in left knee: Secondary | ICD-10-CM | POA: Insufficient documentation

## 2023-10-13 DIAGNOSIS — G8929 Other chronic pain: Secondary | ICD-10-CM | POA: Insufficient documentation

## 2023-10-13 MED ORDER — HYDROCODONE-ACETAMINOPHEN 7.5-325 MG PO TABS: 1.0000 | ORAL_TABLET | Freq: Three times a day (TID) | ORAL | 0 refills | Status: AC | PRN

## 2023-10-13 MED ORDER — HYDROCODONE-ACETAMINOPHEN 7.5-325 MG PO TABS: 1.0000 | ORAL_TABLET | Freq: Three times a day (TID) | ORAL | 0 refills | Status: DC | PRN

## 2023-10-13 NOTE — Progress Notes (Signed)
 PROVIDER NOTE: Interpretation of information contained herein should be left to medically-trained personnel. Specific patient instructions are provided elsewhere under Patient Instructions section of medical record. This document was created in part using AI and STT-dictation technology, any transcriptional errors that may result from this process are unintentional.  Patient: Michele Moore  Service: E/M   PCP: Lemon Raisin, MD  DOB: [birth year]  DOS: 10/13/2023  Provider: Emmy MARLA Blanch, NP  MRN: [id number]  Delivery: Face-to-face  Specialty: Interventional Pain Management  Type: Established Patient  Setting: Ambulatory outpatient facility  Specialty designation: 09  Referring Prov.: Joshua Cathryne BROCKS, MD  Location: Outpatient office facility       History of present illness (HPI) [name], a 73 y.o. year old female, is here today because of her Lumbar spondylosis [M47.816]. Michele Moore's primary complain today is Back Pain (lower)  Pertinent problems: Michele Moore has Chronic knee pain after total replacement of left knee joint; Lumbar radiculopathy; Lumbar degenerative disc disease; Chronic pain syndrome; Spinal stenosis, lumbar region, with neurogenic claudication; Cervical facet syndrome; Primary osteoarthritis of right shoulder; Primary osteoarthritis of left hip; and Lumbar spondylosis on their pertinent problem list.   Pain Assessment: Severity of Chronic pain is reported as a 9 /10. Location: Back Right, Left, Lower, Medial/nothing. Onset: More than a month ago. Quality: Aching, Burning, Constant, Discomfort, Pressure, Numbness, Stabbing, Throbbing. Timing: Constant. Modifying factor(s): meds. Vitals:  height is 5' 11 (1.803 m) and weight is 280 lb (127 kg). Her temperature is 97.8 F (36.6 C). Her blood pressure is 152/84 (abnormal) and her pulse is 66. Her oxygen saturation is 98%.  BMI: Estimated body mass index is 39.05 kg/m as calculated from the following:   Height as of this encounter:  5' 11 (1.803 m).   Weight as of this encounter: 280 lb (127 kg).  Last encounter: 07/09/2023. Last procedure: Visit date not found.  Reason for encounter: evaluation for possible interventional PM therapy/treatment and medication management. No change in medical history since last visit.  Patient's pain is at baseline.  Patient continues multimodal pain regimen as prescribed.  States that it provides pain relief and improvement in functional status.  The patient continues to experience persistent low back pain, which significantly affected her ability to perform daily activities.  We discussed proceeding with a lumbar facet block, which had been previously scheduled but was not performed due to the patient's hospitalization for heart failure. Pharmacotherapy Assessment   Hydrocodone -acetaminophen  (Norco) 7.5-325 mg every 8 hours as needed for pain. MME=22.50 Monitoring: [place] PMP: PDMP reviewed during this encounter.       Pharmacotherapy: No side-effects or adverse reactions reported. Compliance: No problems identified. Effectiveness: Clinically acceptable.  Delores Dorothe LABOR, RN  10/13/2023  3:05 PM  Sign when Signing Visit Nursing Pain Medication Assessment:  Safety precautions to be maintained throughout the outpatient stay will include: orient to surroundings, keep bed in low position, maintain call bell within reach at all times, provide assistance with transfer out of bed and ambulation.  Medication Inspection Compliance: Pill count conducted under aseptic conditions, in front of the patient. Neither the pills nor the bottle was removed from the patient's sight at any time. Once count was completed pills were immediately returned to the patient in their original bottle.  Medication: Hydrocodone /APAP Pill/Patch Count: 30 of 90 pills/patches remain Pill/Patch Appearance: Markings consistent with prescribed medication Bottle Appearance: Standard pharmacy container. Clearly labeled. Filled Date:  7 / 25 / 2025 Last Medication intake:  TodaySafety precautions to be maintained throughout the outpatient stay will include: orient to surroundings, keep bed in low position, maintain call bell within reach at all times, provide assistance with transfer out of bed and ambulation.     UDS:  Summary  Date Value Ref Range Status  07/09/2023 FINAL  Final    Comment:    ==================================================================== [employer] Select 13 (MW) ==================================================================== Test                             Result       Flag       Units  Drug Present and Declared for Prescription Verification   Hydrocodone                     619          EXPECTED   ng/mg creat   Norhydrocodone                 111          EXPECTED   ng/mg creat    Sources of hydrocodone  include scheduled prescription medications.    Norhydrocodone is an expected metabolite of hydrocodone .  ==================================================================== Test                      Result    Flag   Units      Ref Range   Creatinine              54               mg/dL      >=79 ==================================================================== Declared Medications:  The flagging and interpretation on this report are based on the  following declared medications.  Unexpected results may arise from  inaccuracies in the declared medications.   **Note: The testing scope of this panel includes these medications:   Hydrocodone  (Norco)   **Note: The testing scope of this panel does not include the  following reported medications:   Acetaminophen  (Tylenol )  Acetaminophen  (Norco)  Albuterol  (Ventolin  HFA)  Allopurinol  (Zyloprim )  Apremilast  (Otezla )  Calcium   Cetirizine (Zyrtec)  Clobetasol  (Temovate )  Dapagliflozin  (Farxiga )  Dupilumab  Empagliflozin  (Jardiance )  Furosemide  (Lasix )  Gabapentin  (Neurontin )  Levothyroxine  (Synthroid )  Losartan  (Cozaar )   Meclizine  (Antivert )  Metoprolol  (Lopressor )  Montelukast  (Singulair )  Multivitamin  Nystatin  (Mycostatin )  Omega-3-Acid  Ethyl Esters (Lovaza )  Omeprazole  (Prilosec)  Polyethylene Glycol  Potassium (Klor-Con )  Prednisone   Tizanidine  (Zanaflex )  Topical  Topical Diclofenac (Voltaren)  Topical Ketoconazole   Topical Mupirocin   Upadacitinib  Vitamin D3 ==================================================================== For clinical consultation, please call [phone]. ====================================================================     No results found for: CBDTHCR No results found for: D8THCCBX No results found for: D9THCCBX  ROS  Constitutional: Denies any fever or chills Gastrointestinal: No reported hemesis, hematochezia, vomiting, or acute GI distress Musculoskeletal: Lower back pain Neurological: No reported episodes of acute onset apraxia, aphasia, dysarthria, agnosia, amnesia, paralysis, loss of coordination, or loss of consciousness  Medication Review  HYDROcodone -acetaminophen , Multiple Vitamins-Calcium , Vitamin D3, acetaminophen , albuterol , allopurinol , betamethasone  dipropionate, cetirizine, empagliflozin , fluconazole , fluticasone , furosemide , gabapentin , hydrOXYzine , hydrocortisone , levothyroxine , losartan , meclizine , metoprolol  tartrate, mometasone , montelukast , mupirocin  ointment, omega-3 acid ethyl esters, omeprazole , potassium chloride  SA, tiZANidine , and triamcinolone  cream  History Review  Allergy: Michele Moore is allergic to ampicillin, penicillins, vancomycin, and vibramycin  [doxycycline  calcium ]. Drug: Michele Moore  reports no history of drug use. Alcohol :  reports no history of alcohol  use. Tobacco:  reports that  she quit smoking about 30 years ago. Her smoking use included cigarettes. She started smoking about 50 years ago. She has a 40 pack-year smoking history. She has never used smokeless tobacco. Social: Michele Moore  reports that she quit smoking  about 30 years ago. Her smoking use included cigarettes. She started smoking about 50 years ago. She has a 40 pack-year smoking history. She has never used smokeless tobacco. She reports that she does not drink alcohol  and does not use drugs. Medical:  has a past medical history of (HFpEF) heart failure with preserved ejection fraction (HCC) (01/13/2014), Allergy, ANA positive, Anemia of chronic renal failure, stage 3 (moderate), unspecified whether stage 3a or 3b CKD (HCC), Aortic atherosclerosis (HCC), Asthma, Burn, Cataract, Chronic pain syndrome, Chronic, continuous use of opioids, CKD (chronic kidney disease), stage III (HCC), Complication of anesthesia, DDD (degenerative disc disease), lumbar, Dyspnea on exertion, Dysrhythmia, Family history of adverse reaction to anesthesia, GERD (gastroesophageal reflux disease), Glaucoma, Gout, H/O tooth extraction, Hemorrhoid, History of hiatal hernia, History of kidney stones, Hypertension, Hypothyroidism, Implantable loop recorder present (08/22/2021), Long term current use of immunosuppressive drug, Migraines, Mixed hyperlipidemia, Multiple gastric ulcers, Osteoarthritis, Psoriasis, Psoriatic arthritis (HCC), Rheumatoid arthritis (HCC), SS-A antibody positive, and Wears dentures. Surgical: Michele Moore  has a past surgical history that includes Carpal tunnel release; Rotator cuff repair (Right, 1995); Ankle surgery (Right); Tubal ligation; Colonoscopy (2000?); Upper gi endoscopy (2000?); Tonsillectomy; Fusion of talonavicular joint (Right, 08/03/2015); Colonoscopy with propofol  (N/A, 10/22/2017); Esophagogastroduodenoscopy (egd) with propofol  (N/A, 10/22/2017); polypectomy (N/A, 10/22/2017); Total knee revision (Right, 01/20/2018); Joint replacement (Right); Loop recorder implant (08/22/2021); Total knee revision (Left, 10/09/2021); Colonoscopy with propofol  (N/A, 08/14/2022); Esophagogastroduodenoscopy (egd) with propofol  (N/A, 08/14/2022); Hot hemostasis (08/14/2022); and  polypectomy (08/14/2022). Family: family history includes Arthritis in her mother; COPD in her sister; Cancer in her maternal aunt and maternal uncle; Diabetes in her father; Gout in her mother; Heart failure in her father and mother; Hyperlipidemia in her father; Hypertension in her mother; Stroke in her mother.  Laboratory Chemistry Profile   Renal Lab Results  Component Value Date   BUN 43 (H) 09/24/2023   CREATININE 2.16 (H) 09/24/2023   BCR 12 04/02/2023   GFRAA 30 (L) 08/28/2019   GFRNONAA 23 (L) 09/24/2023    Hepatic Lab Results  Component Value Date   AST 29 04/02/2023   ALT 14 04/02/2023   ALBUMIN 4.3 04/02/2023   ALKPHOS 104 04/02/2023    Electrolytes Lab Results  Component Value Date   NA 135 09/24/2023   K 3.9 09/24/2023   CL 101 09/24/2023   CALCIUM  9.3 09/24/2023   MG 2.1 10/26/2022   PHOS 3.0 09/25/2022    Bone No results found for: VD25OH, VD125OH2TOT, CI6874NY7, CI7874NY7, 25OHVITD1, 25OHVITD2, 25OHVITD3, TESTOFREE, TESTOSTERONE  Inflammation (CRP: Acute Phase) (ESR: Chronic Phase) Lab Results  Component Value Date   CRP 1.6 (H) 09/27/2021   ESRSEDRATE 79 (H) 09/27/2021         Note: Above Lab results reviewed.  Recent Imaging Review  CUP PACEART REMOTE DEVICE CHECK ILR summary report received. Battery status OK. Normal device function. No new symptom, tachy, brady, or pause episodes. No new AF episodes, cardiac compass suggestive of atrial arrhythmia not meeting detection criteria of 6 min, not on OAC per Epic.  Monthly summary reports and ROV/PRN. MC, CVRS ECHOCARDIOGRAM COMPLETE    ECHOCARDIOGRAM REPORT       Patient Name:   Michele Moore Date of Exam: 09/21/2023 Medical Rec #:  [id number]  Height:       71.0 in Accession #:    [id number]   Weight:       283.0 lb Date of Birth:  07/30/1950    BSA:          2.444 m Patient Age:    72 years     BP:           150/87 mmHg Patient Gender: F            HR:           74 bpm. Exam  Location:  [facility]  Procedure: 2D Echo, Cardiac Doppler, Color Doppler and Intracardiac            Opacification Agent (Both Spectral and Color Flow Doppler were            utilized during procedure).  Indications:     CHF-Acute Diastolic I50.31   History:         Patient has prior history of Echocardiogram examinations, most                  recent 10/01/2018.   Sonographer:     Ashley McNeely-Sloane Referring Phys:  [id number] JAN A MANSY Diagnosing Phys: Keller Alluri  IMPRESSIONS   1. Left ventricular ejection fraction, by estimation, is 50 to 55%. The left ventricle has low normal function. The left ventricle has no regional wall motion abnormalities. There is mild left ventricular hypertrophy. Left ventricular diastolic  parameters are consistent with Grade I diastolic dysfunction (impaired relaxation).  2. Right ventricular systolic function is normal. The right ventricular size is normal.  3. The mitral valve is normal in structure. Trivial mitral valve regurgitation.  4. The aortic valve was not well visualized. There is mild thickening of the aortic valve. Aortic valve regurgitation is not visualized.  FINDINGS  Left Ventricle: Left ventricular ejection fraction, by estimation, is 50 to 55%. The left ventricle has low normal function. The left ventricle has no regional wall motion abnormalities. Definity  contrast agent was given IV to delineate the left  ventricular endocardial borders. The left ventricular internal cavity size was normal in size. There is mild left ventricular hypertrophy. Left ventricular diastolic parameters are consistent with Grade I diastolic dysfunction (impaired relaxation).  Right Ventricle: The right ventricular size is normal. No increase in right ventricular wall thickness. Right ventricular systolic function is normal.  Left Atrium: Left atrial size was normal in size.  Right Atrium: Right atrial size was normal in size.  Pericardium: There is no  evidence of pericardial effusion.  Mitral Valve: The mitral valve is normal in structure. Trivial mitral valve regurgitation. MV peak gradient, 4.3 mmHg. The mean mitral valve gradient is 1.0 mmHg.  Tricuspid Valve: The tricuspid valve is not well visualized. Tricuspid valve regurgitation is trivial.  Aortic Valve: The aortic valve was not well visualized. There is mild thickening of the aortic valve. Aortic valve regurgitation is not visualized. Aortic valve mean gradient measures 4.0 mmHg. Aortic valve peak gradient measures 6.9 mmHg. Aortic valve  area, by VTI measures 4.34 cm.  Pulmonic Valve: The pulmonic valve was not well visualized. Pulmonic valve regurgitation is not visualized.  Aorta: The aortic root and ascending aorta are structurally normal, with no evidence of dilitation.  IAS/Shunts: The atrial septum is grossly normal.    LEFT VENTRICLE PLAX 2D LVIDd:         4.70 cm     Diastology LVIDs:  3.50 cm     LV e' medial:    5.82 cm/s LV PW:         1.20 cm     LV E/e' medial:  11.0 LV IVS:        1.00 cm     LV e' lateral:   11.00 cm/s LVOT diam:     2.20 cm     LV E/e' lateral: 5.8 LV SV:         103 LV SV Index:   42 LVOT Area:     3.80 cm   LV Volumes (MOD) LV vol d, MOD A2C: 68.9 ml LV vol d, MOD A4C: 89.2 ml LV vol s, MOD A2C: 44.9 ml LV vol s, MOD A4C: 41.3 ml LV SV MOD A2C:     24.0 ml LV SV MOD A4C:     89.2 ml LV SV MOD BP:      34.2 ml  RIGHT VENTRICLE RV S prime:     20.00 cm/s TAPSE (M-mode): 3.5 cm  LEFT ATRIUM           Index        RIGHT ATRIUM          Index LA diam:      4.30 cm 1.76 cm/m   RA Area:     9.49 cm LA Vol (A4C): 46.8 ml 19.15 ml/m  RA Volume:   16.50 ml 6.75 ml/m  AORTIC VALVE                    PULMONIC VALVE AV Area (Vmax):    3.89 cm     PV Vmax:        1.18 m/s AV Area (Vmean):   3.93 cm     PV Vmean:       78.550 cm/s AV Area (VTI):     4.34 cm     PV VTI:         0.216 m AV Vmax:           131.00 cm/s  PV  Peak grad:   5.6 mmHg AV Vmean:          87.300 cm/s  PV Mean grad:   3.0 mmHg AV VTI:            0.238 m      RVOT Peak grad: 4 mmHg AV Peak Grad:      6.9 mmHg AV Mean Grad:      4.0 mmHg LVOT Vmax:         134.00 cm/s LVOT Vmean:        90.300 cm/s LVOT VTI:          0.272 m LVOT/AV VTI ratio: 1.14   AORTA Ao Root diam: 3.40 cm Ao Asc diam:  3.40 cm  MITRAL VALVE MV Area (PHT): 2.83 cm    SHUNTS MV Area VTI:   4.13 cm    Systemic VTI:  0.27 m MV Peak grad:  4.3 mmHg    Systemic Diam: 2.20 cm MV Mean grad:  1.0 mmHg    Pulmonic VTI:  0.185 m MV Vmax:       1.04 m/s MV Vmean:      44.4 cm/s MV Decel Time: 268 msec MV E velocity: 63.90 cm/s MV A velocity: 89.70 cm/s MV E/A ratio:  0.71  Keller Paterson Electronically signed by Keller Paterson Signature Date/Time: 09/21/2023/5:54:55 PM      Final   US  Venous Img  Lower Unilateral Right (DVT) CLINICAL DATA:  Swelling, pain  EXAM: RIGHT LOWER EXTREMITY VENOUS DOPPLER ULTRASOUND  TECHNIQUE: Gray-scale sonography with compression, as well as color and duplex ultrasound, were performed to evaluate the deep venous system(s) from the level of the common femoral vein through the popliteal and proximal calf veins.  COMPARISON:  07/21/2023 bilateral  FINDINGS: VENOUS  Normal compressibility of the common femoral, superficial femoral, and popliteal veins, as well as the visualized calf veins. Visualized portions of profunda femoral vein and great saphenous vein unremarkable. No filling defects to suggest DVT on grayscale or color Doppler imaging. Doppler waveforms show normal direction of venous flow, normal respiratory plasticity and response to augmentation.  Limited views of the contralateral common femoral vein are unremarkable.  OTHER  None.  Limitations: none  IMPRESSION: Negative.  Electronically Signed   By: JONETTA Faes M.D.   On: 09/21/2023 11:13 Note: Reviewed        Physical Exam  Vitals: BP (!)  152/84   Pulse 66   Temp 97.8 F (36.6 C)   Ht 5' 11 (1.803 m)   Wt 280 lb (127 kg)   SpO2 98%   BMI 39.05 kg/m  BMI: Estimated body mass index is 39.05 kg/m as calculated from the following:   Height as of this encounter: 5' 11 (1.803 m).   Weight as of this encounter: 280 lb (127 kg). Ideal: Ideal body weight: 70.8 kg (156 lb 1.4 oz) Adjusted ideal body weight: 93.3 kg (205 lb 10.4 oz) General appearance: Well nourished, well developed, and well hydrated. In no apparent acute distress Mental status: Alert, oriented x 3 (person, place, & time)       Respiratory: No evidence of acute respiratory distress Eyes: PERLA  Lumbar Exam  Skin & Axial Inspection: No masses, redness, or swelling Alignment: Symmetrical Functional ROM: Pain restricted ROM       Stability: No instability detected Muscle Tone/Strength: Functionally intact. No obvious neuro-muscular anomalies detected. Sensory (Neurological): lumbar facet pain  Palpation: No palpable anomalies       Provocative Tests: Hyperextension/rotation test: (+) bilaterally for facet joint pain. Lumbar quadrant test (Kemp's test): (+) bilaterally for facet joint pain. Lateral bending test: deferred today       Patrick's Maneuver: deferred today                   FABER* test: deferred today                   S-I anterior distraction/compression test: deferred today         S-I lateral compression test: deferred today         S-I Thigh-thrust test: deferred today         S-I Gaenslen's test: deferred today         *(Flexion, ABduction and External Rotation)  Lower Extremity Exam      Side: Right lower extremity   Side: Left lower extremity  Stability: No instability observed           Stability: No instability observed          Skin & Extremity Inspection: Skin color, temperature, and hair growth are WNL. No peripheral edema or cyanosis. No masses, redness, swelling, asymmetry, or associated skin lesions. No contractures.   Skin &  Extremity Inspection: Skin color, temperature, and hair growth are WNL. No peripheral edema or cyanosis. No masses, redness, swelling, asymmetry, or associated skin lesions. No contractures.  Functional ROM: Unrestricted  ROM                   Functional ROM: Decreased ROM for hip and knee joints          Muscle Tone/Strength: Functionally intact. No obvious neuro-muscular anomalies detected.   Muscle Tone/Strength: Functionally intact. No obvious neuro-muscular anomalies detected.  Sensory (Neurological): Unimpaired   Sensory (Neurological): Dermatomal pain pattern and arthropathic of left knee  Palpation: No palpable anomalies   Palpation: No palpable anomalies    Assessment   Diagnosis Status  1. Lumbar spondylosis   2. Degeneration of intervertebral disc of lumbar region with discogenic back pain   3. Chronic pain syndrome   4. Lumbar radiculopathy   5. Primary osteoarthritis of left hip   6. Piriformis syndrome of left side   7. Chronic knee pain after total replacement of left knee joint   8. Cervical spondylosis without myelopathy   9. Medication management    Persistent Persistent Controlled   Updated Problems: No problems updated.  Plan of Care  Problem-specific:  Assessment and Plan We will continue on current medication regimen.  Prescribing drug monitoring (PDMP) PDMP reviewed; findings consistent with the use of prescribed medication and no evidence of narcotic misuse or abuse.  Urine drug screen (UDS) up-to-date.  Schedule follow-up in 90 days for medication management.  Plan: Lumbar Facet MBNB # 1 with Dr. Marcelino   [name] has a current medication list which includes the following long-term medication(s): albuterol , allopurinol , cetirizine, furosemide , gabapentin , levothyroxine , metoprolol  tartrate, montelukast , omega-3 acid ethyl esters, omeprazole , potassium chloride  sa, [DISCONTINUED] fluticasone , and [DISCONTINUED] mometasone .  Pharmacotherapy  (Medications Ordered): Meds ordered this encounter  Medications   HYDROcodone -acetaminophen  (NORCO) 7.5-325 MG tablet    Sig: Take 1 tablet by mouth every 8 (eight) hours as needed for moderate pain (pain score 4-6). Must last 30 days    Dispense:  90 tablet    Refill:  0   HYDROcodone -acetaminophen  (NORCO) 7.5-325 MG tablet    Sig: Take 1 tablet by mouth every 8 (eight) hours as needed for moderate pain (pain score 4-6). Must last 30 days    Dispense:  90 tablet    Refill:  0   HYDROcodone -acetaminophen  (NORCO) 7.5-325 MG tablet    Sig: Take 1 tablet by mouth every 8 (eight) hours as needed for moderate pain (pain score 4-6). Must last 30 days    Dispense:  90 tablet    Refill:  0   Orders:  Orders Placed This Encounter  Procedures   LUMBAR FACET(MEDIAL BRANCH NERVE BLOCK) MBNB    Diagnosis: Lumbar Facet Syndrome (M47.816); Lumbosacral Facet Syndrome (M47.817); Lumbar Facet Joint Pain (M54.59) Medical Necessity Statement: 1.Severe chronic axial low back pain causing functional impairment documented by ongoing pain scale assessments. 2.Pain present for longer than 3 months (Chronic) documented to have failed noninvasive conservative therapies. 3.Absence of untreated radiculopathy. 4.There is no radiological evidence of untreated fractures, tumor, infection, or deformity.  Physical Examination Findings: Positive Kemp Maneuver: (Y)  Positive Lumbar Hyperextension-Rotation provocative test: (Y)    Standing Status:   Future    Expiration Date:   01/13/2024    Scheduling Instructions:     Procedure: Lumbar facet Block     Type: Medial Branch Block     Side: Bilateral     Purpose: Diagnostic/Therapeutic     Level(s): L3-4 and L4-5 Facets (L3, L4, and L5 Medial Branch)     Sedation: Patient's choice.     Timeframe:  As soon as schedule allows.    Where will this procedure be performed?:   [facility]        Return in about 3 months (around 01/13/2024) for (F2F), (MM),  Emmy Blanch NP.    Recent Visits No visits were found meeting these conditions. Showing recent visits within past 90 days and meeting all other requirements Today's Visits Date Type Provider Dept  10/13/23 Office Visit Cliffard Hair K, NP [facility]  Showing today's visits and meeting all other requirements Future Appointments Date Type Provider Dept  01/06/24 Appointment Jomel Whittlesey K, NP [facility]  Showing future appointments within next 90 days and meeting all other requirements  I discussed the assessment and treatment plan with the patient. The patient was provided an opportunity to ask questions and all were answered. The patient agreed with the plan and demonstrated an understanding of the instructions.  Patient advised to call back or seek an in-person evaluation if the symptoms or condition worsens.  Duration of encounter: 30 minutes.  Total time on encounter, as per AMA guidelines included both the face-to-face and non-face-to-face time personally spent by the physician and/or other qualified health care professional(s) on the day of the encounter (includes time in activities that require the physician or other qualified health care professional and does not include time in activities normally performed by clinical staff). Physician's time may include the following activities when performed: Preparing to see the patient (e.g., pre-charting review of records, searching for previously ordered imaging, lab work, and nerve conduction tests) Review of prior analgesic pharmacotherapies. Reviewing PMP Interpreting ordered tests (e.g., lab work, imaging, nerve conduction tests) Performing post-procedure evaluations, including interpretation of diagnostic procedures Obtaining and/or reviewing separately obtained history Performing a medically appropriate examination and/or evaluation Counseling and educating the patient/family/caregiver Ordering medications, tests,  or procedures Referring and communicating with other health care professionals (when not separately reported) Documenting clinical information in the electronic or other health record Independently interpreting results (not separately reported) and communicating results to the patient/ family/caregiver Care coordination (not separately reported)  Note by: Keven Soucy K Gaige Fussner, NP (TTS and AI technology used. I apologize for any typographical errors that were not detected and corrected.) Date: 10/13/2023; Time: 3:40 PM

## 2023-10-13 NOTE — Progress Notes (Signed)
 Nursing Pain Medication Assessment:  Safety precautions to be maintained throughout the outpatient stay will include: orient to surroundings, keep bed in low position, maintain call bell within reach at all times, provide assistance with transfer out of bed and ambulation.  Medication Inspection Compliance: Pill count conducted under aseptic conditions, in front of the patient. Neither the pills nor the bottle was removed from the patient's sight at any time. Once count was completed pills were immediately returned to the patient in their original bottle.  Medication: Hydrocodone /APAP Pill/Patch Count: 30 of 90 pills/patches remain Pill/Patch Appearance: Markings consistent with prescribed medication Bottle Appearance: Standard pharmacy container. Clearly labeled. Filled Date: 7 / 25 / 2025 Last Medication intake:  TodaySafety precautions to be maintained throughout the outpatient stay will include: orient to surroundings, keep bed in low position, maintain call bell within reach at all times, provide assistance with transfer out of bed and ambulation.

## 2023-10-19 ENCOUNTER — Other Ambulatory Visit: Payer: Self-pay

## 2023-10-19 ENCOUNTER — Encounter: Payer: Self-pay | Admitting: *Deleted

## 2023-10-19 ENCOUNTER — Emergency Department

## 2023-10-19 ENCOUNTER — Emergency Department: Admission: EM | Admit: 2023-10-19 | Discharge: 2023-10-19 | Disposition: A | Discharge status: Home / Self Care

## 2023-10-19 DIAGNOSIS — I130 Hypertensive heart and chronic kidney disease with heart failure and stage 1 through stage 4 chronic kidney disease, or unspecified chronic kidney disease: Secondary | ICD-10-CM | POA: Insufficient documentation

## 2023-10-19 DIAGNOSIS — R7989 Other specified abnormal findings of blood chemistry: Secondary | ICD-10-CM | POA: Insufficient documentation

## 2023-10-19 DIAGNOSIS — R600 Localized edema: Secondary | ICD-10-CM | POA: Insufficient documentation

## 2023-10-19 DIAGNOSIS — I5030 Unspecified diastolic (congestive) heart failure: Secondary | ICD-10-CM | POA: Diagnosis not present

## 2023-10-19 DIAGNOSIS — D649 Anemia, unspecified: Secondary | ICD-10-CM | POA: Insufficient documentation

## 2023-10-19 DIAGNOSIS — N183 Chronic kidney disease, stage 3 unspecified: Secondary | ICD-10-CM | POA: Insufficient documentation

## 2023-10-19 LAB — CBC
HCT: 31.7 % — ABNORMAL LOW (ref 36.0–46.0)
Hemoglobin: 10.3 g/dL — ABNORMAL LOW (ref 12.0–15.0)
MCH: 29.7 pg (ref 26.0–34.0)
MCHC: 32.5 g/dL (ref 30.0–36.0)
MCV: 91.4 fL (ref 80.0–100.0)
Platelets: 171 K/uL (ref 150–400)
RBC: 3.47 MIL/uL — ABNORMAL LOW (ref 3.87–5.11)
RDW: 14.8 % (ref 11.5–15.5)
WBC: 5.1 K/uL (ref 4.0–10.5)
nRBC: 0 % (ref 0.0–0.2)

## 2023-10-19 LAB — TROPONIN I (HIGH SENSITIVITY)
Troponin I (High Sensitivity): 16 ng/L (ref <18)
Troponin I (High Sensitivity): 14 ng/L (ref <18)

## 2023-10-19 LAB — BASIC METABOLIC PANEL WITH GFR
Anion gap: 10 (ref 5–15)
BUN: 24 mg/dL — ABNORMAL HIGH (ref 8–23)
CO2: 29 mmol/L (ref 22–32)
Calcium: 10.1 mg/dL (ref 8.9–10.3)
Chloride: 105 mmol/L (ref 98–111)
Creatinine, Ser: 1.8 mg/dL — ABNORMAL HIGH (ref 0.44–1.00)
GFR, Estimated: 29 mL/min — ABNORMAL LOW (ref >60)
Glucose, Bld: 100 mg/dL — ABNORMAL HIGH (ref 70–99)
Potassium: 4.2 mmol/L (ref 3.5–5.1)
Sodium: 144 mmol/L (ref 135–145)

## 2023-10-19 LAB — BRAIN NATRIURETIC PEPTIDE: B Natriuretic Peptide: 99.4 pg/mL (ref 0.0–100.0)

## 2023-10-19 MED ORDER — BUTALBITAL-APAP-CAFFEINE 50-325-40 MG PO TABS: 1.0000 | ORAL_TABLET | Freq: Four times a day (QID) | ORAL | 0 refills | Status: AC | PRN

## 2023-10-19 MED ORDER — FUROSEMIDE 10 MG/ML IJ SOLN
40.0000 mg | Freq: Once | INTRAMUSCULAR | Status: AC
  Administered 2023-10-19: 40 mg via INTRAVENOUS
  Filled 2023-10-19: qty 4

## 2023-10-19 MED ORDER — ACETAMINOPHEN 325 MG PO TABS
650.0000 mg | ORAL_TABLET | Freq: Once | ORAL | Status: AC
  Administered 2023-10-19: 650 mg via ORAL
  Filled 2023-10-19: qty 2

## 2023-10-19 NOTE — ED Triage Notes (Signed)
 Pt to triage via wheelchair.  Pt brought in via ems from doctor's office.  Pt has swelling to both feet and hands.   Pt recently d/c for chf from [facility].   Pt reports sob with ambulating.  No chest pain.  Pt alert  speech clear.

## 2023-10-19 NOTE — ED Triage Notes (Signed)
 Pt comes via EMS from [facility] with c/o foot edema to bilateral feet. Pt was here two weeks ago for CHF. Pt denies any sob.

## 2023-10-19 NOTE — ED Notes (Signed)
 Pt ambulated to the restroom and back

## 2023-10-19 NOTE — ED Provider Notes (Signed)
 [facility] Provider Note    Event Date/Time   First MD Initiated Contact with Patient 10/19/23 1636     (approximate)   History   Leg Swelling   HPI  Michele Moore is a 73 y.o. female with PMH of stage III CKD, hpf EF, osteoarthritis, hypertension, RA who presents for evaluation of bilateral lower extremity edema.  Patient's doctor at [facility] called an ambulance due to her foot swelling.  Patient reports shortness of breath with exertion.  She states she does not feel short of breath at rest.  She has not noticed significant weight gain.  She also mentions some pain in her left shoulder which she attributes to arthritis.  She also states she has been having migraines and has a history of migraines and would like a refill of her Fioricet .      Physical Exam   Triage Vital Signs: ED Triage Vitals  Encounter Vitals Group     BP 10/19/23 1509 (!) 146/86     Girls Systolic BP Percentile --      Girls Diastolic BP Percentile --      Boys Systolic BP Percentile --      Boys Diastolic BP Percentile --      Pulse Rate 10/19/23 1509 (!) 51     Resp 10/19/23 1509 20     Temp 10/19/23 1509 98.5 F (36.9 C)     Temp Source 10/19/23 1509 Oral     SpO2 10/19/23 1509 98 %     Weight 10/19/23 1510 270 lb (122.5 kg)     Height 10/19/23 1510 5' 11 (1.803 m)     Head Circumference --      Peak Flow --      Pain Score 10/19/23 1510 10     Pain Loc --      Pain Education --      Exclude from Growth Chart --     Most recent vital signs: Vitals:   10/19/23 1509  BP: (!) 146/86  Pulse: (!) 51  Resp: 20  Temp: 98.5 F (36.9 C)  SpO2: 98%   General: Awake, no distress.  CV:  Good peripheral perfusion.  RRR. Resp:  Normal effort.  CTAB. Abd:  No distention.  Other:  Nonpitting edema to bilateral feet and hands, radial pulses are 2+ and regular, dorsalis pedis pulse is 2+ and regular.   ED Results / Procedures / Treatments   Labs (all labs ordered are  listed, but only abnormal results are displayed) Labs Reviewed  BASIC METABOLIC PANEL WITH GFR - Abnormal; Notable for the following components:      Result Value   Glucose, Bld 100 (*)    BUN 24 (*)    Creatinine, Ser 1.80 (*)    GFR, Estimated 29 (*)    All other components within normal limits  CBC - Abnormal; Notable for the following components:   RBC 3.47 (*)    Hemoglobin 10.3 (*)    HCT 31.7 (*)    All other components within normal limits  BRAIN NATRIURETIC PEPTIDE  TROPONIN I (HIGH SENSITIVITY)  TROPONIN I (HIGH SENSITIVITY)     EKG  ED Provider: EKG shows sinus bradycardia.  Vent. rate 51 BPM PR interval 192 ms QRS duration 74 ms QT/QTcB 434/400 ms P-R-T axes 66 -22 43  RADIOLOGY  Chest xray obtained, I interpreted the images as well as reviewed the radiologist report which showed cardiac enlargement and mild central vascular congestion.  No edema or consolidation.  PROCEDURES:  Critical Care performed: No  Procedures   MEDICATIONS ORDERED IN ED: Medications - No data to display   IMPRESSION / MDM / ASSESSMENT AND PLAN / ED COURSE  I reviewed the triage vital signs and the nursing notes.                             73 year old female presents for evaluation of bilateral hand and foot edema.  Blood pressure is elevated and patient is a little bit bradycardic.  Vital signs stable otherwise.  Patient NAD on exam.  Differential diagnosis includes, but is not limited to, worsening heart failure, worsening kidney disease, lymphedema, venous insufficiency.    Patient's presentation is most consistent with acute complicated illness / injury requiring diagnostic workup.  CBC shows stable anemia.  BMP shows elevated creatinine and BUN which is improving from previous lab work.  BNP is not elevated.  Troponin is not elevated.  EKG shows sinus bradycardia.  Chest x-ray shows mild central vascular congestion and cardiac enlargement consistent with her  baseline.  Per review of patient's discharge summary she was instructed to begin taking 40 mg of furosemide  twice daily instead of once a day.  Patient has been compliant with her medications.  I reviewed her weights throughout the previous month and patient has lost weight since her hospital admission. Her kidney function continues to improve. Her BNP is not elevated. She is 100% on room air while in the ED. Patient does have bilateral lower extremity edema which she has had since her hospital discharge. Patient does not feel that this is significantly worse. I did consider inpatient admission but I do not feel that she meets criteria for worsening heart failure. She will be given a dose of IV lasix  and tylenol  for pain. I can also refill her prescription for fioricet . Advised she continue taking her home medicaitons as prescribed. Anticipate discharge home with outpatient follow up to her PCP.  Patient voiced understanding, all questions were answered and she was stable at discharge.   Clinical Course as of 10/19/23 1743  Mon Oct 19, 2023  1728 Patient evaluated by myself, appears she was sent here for bilateral lower extremity edema by a nurse when she attempted to check in for a clinic appointment.  In review of her chart, most recently admitted for CHF exacerbation, is taking 40 mg twice daily Lasix .  Appears she has lost about 13 pounds since discharged on August 1.  Satting well on room air with normal work of breathing here.  Does have some bilateral lower extremity edema, right slightly larger than left, tells me this is usual for her.  Appears she had a negative DVT ultrasound on 09/21/2023 of her right lower extremity on my chart review, not clinically concern for DVT at this time.  Tells me she has had worsening swelling in this leg in the setting of 3 prior surgeries on it.  Chest x-ray with very mild vascular congestion, otherwise unremarkable.  Troponin and BNP normal.  Separately patient  complains of a chronic headache similar to her prior headaches.  No red flag symptoms on my history and no obvious neurologic deficits.  Does take medicine for migraines and had negative brain imaging earlier this year.  No evidence of acute pathology at this time and no indication for admission with regard to respiratory failure.  Will treat with small dose of Lasix  and provide some  Tylenol  and plan to continue with her home outpatient diuresis which appears to be working appropriately.  Plan for PMD and cardiology follow-up.  ED return precautions in place.  Patient agrees with plan. [MM]    Clinical Course User Index [MM] Clarine Ozell LABOR, MD     FINAL CLINICAL IMPRESSION(S) / ED DIAGNOSES   Final diagnoses:  None     Rx / DC Orders   ED Discharge Orders     None        Note:  This document was prepared using Dragon voice recognition software and may include unintentional dictation errors.   Cleaster Tinnie LABOR, PA-C 10/19/23 1906    Clarine Ozell LABOR, MD 10/19/23 2352

## 2023-10-19 NOTE — Discharge Instructions (Addendum)
 Your blood work is continuing to improve. Your EKG was normal. Your chest xray was unremarkable.   Please continue to take your home medications as prescribed and follow up with your primary care provider and cardiologist as needed.  I sent a refill of the Fioricet .  While taking this medication do not take Norco at the same time.

## 2023-10-22 ENCOUNTER — Ambulatory Visit

## 2023-10-22 DIAGNOSIS — I5032 Chronic diastolic (congestive) heart failure: Secondary | ICD-10-CM

## 2023-10-22 LAB — CUP PACEART REMOTE DEVICE CHECK
Date Time Interrogation Session: 20250827232311
Implantable Pulse Generator Implant Date: 20230629

## 2023-10-25 ENCOUNTER — Ambulatory Visit: Payer: Self-pay | Admitting: Cardiology

## 2023-10-29 ENCOUNTER — Telehealth: Payer: Self-pay

## 2023-10-29 NOTE — Telephone Encounter (Signed)
 Copied from CRM [id number]. Topic: Clinical - Medication Question >> Oct 28, 2023  5:03 PM DeAngela L wrote: Reason for CRM: patient states when she was in the hospital her thyroid  medication was changed to a different dosage  levothyroxine  (SYNTHROID ) 100 MCG tablet  The patient would like to ask what her provider is going to do  The patient says she is supposed to be taking 137 mg she says this is her usual mg   Patient doesn't have a refill for the new dosage that the hospital changed her mg to and she only have refill 137 mg is the only refills she has available to pick up and she is calling to ask if her provider could write her a new prescription for the100 mg or can the patient take the refill that she previously was taking   Pt num [phone] (H)

## 2023-10-30 ENCOUNTER — Other Ambulatory Visit: Payer: Self-pay | Admitting: Student

## 2023-10-30 MED ORDER — LEVOTHYROXINE SODIUM 100 MCG PO TABS: 100.0000 ug | ORAL_TABLET | Freq: Every day | ORAL | 1 refills | Status: DC

## 2023-10-30 NOTE — Telephone Encounter (Signed)
 Called patient to discuss phone call regarding her thyroid  medication, there was no answer. Left VM to call our office back to discuss this matter.

## 2023-10-30 NOTE — Telephone Encounter (Signed)
 Spoke with patient, she stated her ER visit was from September 20, 2023 at [facility].

## 2023-11-02 NOTE — Telephone Encounter (Signed)
 Duplicate request, refilled 10/30/23.  Requested Prescriptions  Pending Prescriptions Disp Refills   levothyroxine  (SYNTHROID ) 100 MCG tablet [Pharmacy Med Name: LEVOTHYROXINE  0.100MG  ( ) TAB] 90 tablet     Sig: TAKE 1 TABLET(100 MCG) BY MOUTH DAILY AT 6 AM     Endocrinology:  Hypothyroid Agents Failed - 11/02/2023  9:45 AM      Failed - TSH in normal range and within 360 days    TSH  Date Value Ref Range Status  09/20/2023 0.024 (L) 0.350 - 4.500 uIU/mL Final    Comment:    Performed by a 3rd Generation assay with a functional sensitivity of <=0.01 uIU/mL. Performed at [facility], [address] [place], [place] [address]   04/02/2023 0.495 0.450 - 4.500 uIU/mL Final         Passed - Valid encounter within last 12 months    Recent Outpatient Visits           1 month ago Dysuria   [facility] at [facility], Harlene, MD   7 months ago Hypertension, unspecified type   [facility] at [facility] Cathryne JAYSON, MD

## 2023-11-02 NOTE — Telephone Encounter (Signed)
 Called pt let her know refill was sent to the pharmacy.  KP

## 2023-11-03 ENCOUNTER — Inpatient Hospital Stay: Attending: Oncology

## 2023-11-03 DIAGNOSIS — N1832 Chronic kidney disease, stage 3b: Secondary | ICD-10-CM | POA: Insufficient documentation

## 2023-11-03 DIAGNOSIS — D631 Anemia in chronic kidney disease: Secondary | ICD-10-CM | POA: Insufficient documentation

## 2023-11-03 LAB — CBC WITH DIFFERENTIAL (CANCER CENTER ONLY)
Abs Immature Granulocytes: 0.01 K/uL (ref 0.00–0.07)
Basophils Absolute: 0 K/uL (ref 0.0–0.1)
Basophils Relative: 0 %
Eosinophils Absolute: 0 K/uL (ref 0.0–0.5)
Eosinophils Relative: 1 %
HCT: 31 % — ABNORMAL LOW (ref 36.0–46.0)
Hemoglobin: 10.1 g/dL — ABNORMAL LOW (ref 12.0–15.0)
Immature Granulocytes: 0 %
Lymphocytes Relative: 20 %
Lymphs Abs: 1 K/uL (ref 0.7–4.0)
MCH: 30 pg (ref 26.0–34.0)
MCHC: 32.6 g/dL (ref 30.0–36.0)
MCV: 92 fL (ref 80.0–100.0)
Monocytes Absolute: 0.4 K/uL (ref 0.1–1.0)
Monocytes Relative: 9 %
Neutro Abs: 3.4 K/uL (ref 1.7–7.7)
Neutrophils Relative %: 70 %
Platelet Count: 157 K/uL (ref 150–400)
RBC: 3.37 MIL/uL — ABNORMAL LOW (ref 3.87–5.11)
RDW: 14.6 % (ref 11.5–15.5)
WBC Count: 4.9 K/uL (ref 4.0–10.5)
nRBC: 0 % (ref 0.0–0.2)

## 2023-11-03 LAB — IRON AND TIBC
Iron: 62 ug/dL (ref 28–170)
Saturation Ratios: 22 % (ref 10.4–31.8)
TIBC: 279 ug/dL (ref 250–450)
UIBC: 217 ug/dL

## 2023-11-03 LAB — FERRITIN: Ferritin: 63 ng/mL (ref 11–307)

## 2023-11-04 ENCOUNTER — Inpatient Hospital Stay (HOSPITAL_BASED_OUTPATIENT_CLINIC_OR_DEPARTMENT_OTHER): Admitting: Oncology

## 2023-11-04 ENCOUNTER — Encounter: Payer: Self-pay | Admitting: Oncology

## 2023-11-04 ENCOUNTER — Inpatient Hospital Stay

## 2023-11-04 VITALS — BP 115/74 | HR 64

## 2023-11-04 VITALS — BP 138/84 | HR 84 | Temp 97.6°F | Resp 18 | Wt 288.7 lb

## 2023-11-04 DIAGNOSIS — N1832 Chronic kidney disease, stage 3b: Secondary | ICD-10-CM | POA: Diagnosis not present

## 2023-11-04 DIAGNOSIS — N183 Chronic kidney disease, stage 3 unspecified: Secondary | ICD-10-CM

## 2023-11-04 DIAGNOSIS — Z0000 Encounter for general adult medical examination without abnormal findings: Secondary | ICD-10-CM | POA: Diagnosis not present

## 2023-11-04 DIAGNOSIS — D631 Anemia in chronic kidney disease: Secondary | ICD-10-CM | POA: Diagnosis not present

## 2023-11-04 DIAGNOSIS — D649 Anemia, unspecified: Secondary | ICD-10-CM

## 2023-11-04 MED ORDER — SODIUM CHLORIDE 0.9% FLUSH
10.0000 mL | Freq: Once | INTRAVENOUS | Status: AC | PRN
  Administered 2023-11-04: 10 mL
  Filled 2023-11-04: qty 10

## 2023-11-04 MED ORDER — IRON SUCROSE 20 MG/ML IV SOLN
200.0000 mg | Freq: Once | INTRAVENOUS | Status: AC
  Administered 2023-11-04: 200 mg via INTRAVENOUS
  Filled 2023-11-04: qty 10

## 2023-11-04 NOTE — Assessment & Plan Note (Signed)
 Screening mammogram bilaterally is up-to-date.  Results were reviewed.

## 2023-11-04 NOTE — Assessment & Plan Note (Signed)
 Encourage oral hydration and avoid nephrotoxins.

## 2023-11-04 NOTE — Assessment & Plan Note (Addendum)
 Labs reviewed and discussed with patient.  Hemoglobin remains low despite Venofer  and EPO.   Lab Results  Component Value Date   HGB 10.1 (L) 11/03/2023   TIBC 279 11/03/2023   IRONPCTSAT 22 11/03/2023   FERRITIN 63 11/03/2023   No need for Retacrit  today Ferritin <200, recommend Venofer  treatments to further increase iron  level.  . Recommend IV Venofer  weekly x2 Her hemoglobin has been stably above 10.  Recommend increase intervals of lab check: H&H every 6 weeks, proceed with Retacrit  if hemoglobin less than 10.
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
## 2023-11-04 NOTE — Assessment & Plan Note (Signed)
#  Rheumatoid arthritis off methotrexate.

## 2023-11-05 NOTE — Progress Notes (Signed)
 Remote Loop Recorder Transmission

## 2023-11-06 ENCOUNTER — Encounter: Admitting: Student

## 2023-11-06 NOTE — Progress Notes (Signed)
 Remote Loop Recorder Transmission

## 2023-11-09 ENCOUNTER — Inpatient Hospital Stay

## 2023-11-09 VITALS — BP 116/72 | HR 65 | Temp 97.2°F | Resp 18

## 2023-11-09 DIAGNOSIS — N1832 Chronic kidney disease, stage 3b: Secondary | ICD-10-CM | POA: Diagnosis not present

## 2023-11-09 DIAGNOSIS — N183 Chronic kidney disease, stage 3 unspecified: Secondary | ICD-10-CM

## 2023-11-09 DIAGNOSIS — D649 Anemia, unspecified: Secondary | ICD-10-CM

## 2023-11-09 MED ORDER — IRON SUCROSE 20 MG/ML IV SOLN
200.0000 mg | Freq: Once | INTRAVENOUS | Status: AC
  Administered 2023-11-09: 200 mg via INTRAVENOUS
  Filled 2023-11-09: qty 10

## 2023-11-09 NOTE — Patient Instructions (Signed)

## 2023-11-23 ENCOUNTER — Ambulatory Visit (INDEPENDENT_AMBULATORY_CARE_PROVIDER_SITE_OTHER)

## 2023-11-23 ENCOUNTER — Other Ambulatory Visit: Payer: Self-pay

## 2023-11-23 DIAGNOSIS — I5032 Chronic diastolic (congestive) heart failure: Secondary | ICD-10-CM

## 2023-11-23 DIAGNOSIS — E876 Hypokalemia: Secondary | ICD-10-CM

## 2023-11-23 LAB — CUP PACEART REMOTE DEVICE CHECK
Date Time Interrogation Session: 20250928233148
Implantable Pulse Generator Implant Date: 20230629

## 2023-11-25 NOTE — Progress Notes (Signed)
 Remote Loop Recorder Transmission

## 2023-11-26 ENCOUNTER — Ambulatory Visit: Payer: Self-pay | Admitting: Cardiology

## 2023-11-26 NOTE — Progress Notes (Signed)
 Remote Loop Recorder Transmission

## 2023-12-01 NOTE — Telephone Encounter (Signed)
 Copied from CRM [id number]. Topic: Clinical - Medication Refill >> Dec 01, 2023  3:20 PM Harlene ORN wrote: Medication: potassium chloride  SA (KLOR-CON  M) 20 MEQ tablet, levothyroxine  (SYNTHROID ) 100 MCG tablet  Has the patient contacted their pharmacy? Yes (Agent: If no, request that the patient contact the pharmacy for the refill. If patient does not wish to contact the pharmacy document the reason why and proceed with request.) (Agent: If yes, when and what did the pharmacy advise?)  This is the patient's preferred pharmacy:  [id number] [place] - [address] AT [address] [facility] [address] [place] [address] Phone: [phone] Fax: [phone]  Is this the correct pharmacy for this prescription? Yes If no, delete pharmacy and type the correct one.   Has the prescription been filled recently? No  Is the patient out of the medication? Yes  Has the patient been seen for an appointment in the last year OR does the patient have an upcoming appointment? Yes  Can we respond through MyChart? No  Agent: Please be advised that Rx refills may take up to 3 business days. We ask that you follow-up with your pharmacy.

## 2023-12-10 ENCOUNTER — Telehealth: Payer: Self-pay | Admitting: Student

## 2023-12-10 NOTE — Telephone Encounter (Signed)
Attempted to call patient back . No answer. LMTCB.

## 2023-12-10 NOTE — Telephone Encounter (Signed)
 Patient called back to clinic and stated that she is feeling okay now   This RN informed the patient that she was having frequent ectopy on her transmission which was likely causing her heart rate to wildly fluctuate on the pulse oximeter that her home health nurse was using at the time to check her vital signs  Patient instructed by this RN to push symptom activator and to call the clinic when she experiences similar symptoms and she verbalized understanding  All questions and concerns addressed at this time

## 2023-12-10 NOTE — Telephone Encounter (Signed)
 S/w the patient- A nurse from [facility] was there for a while with the patient and called. She reports that her heart beat is jumping around- anywhere from 30's-55.  When she is just sitting, it goes as low as the 30's  She had a nerve block yesterday due to pain in neck and shoulder.    Said she has been short of breath over the last week. No chest pain, no sweating, no nausea, no dizziness.   Informed her that I will send this information to her provider and our device team to take a look at the transmission. Will call back with recommendations. She verbalized understanding.

## 2023-12-10 NOTE — Telephone Encounter (Signed)
 STAT if HR is under 50 or over 120  (normal HR is 60-100 beats per minute)  What is your heart rate? Lows 30 and 70s  Do you have a log of your heart rate readings (document readings)?    Do you have any other symptoms? chest pain (tightness)

## 2023-12-16 ENCOUNTER — Encounter: Admitting: Student

## 2023-12-16 ENCOUNTER — Inpatient Hospital Stay: Attending: Oncology

## 2023-12-16 DIAGNOSIS — D631 Anemia in chronic kidney disease: Secondary | ICD-10-CM | POA: Diagnosis present

## 2023-12-16 DIAGNOSIS — N1832 Chronic kidney disease, stage 3b: Secondary | ICD-10-CM | POA: Diagnosis present

## 2023-12-16 LAB — HEMOGLOBIN AND HEMATOCRIT (CANCER CENTER ONLY)
HCT: 35.6 % — ABNORMAL LOW (ref 36.0–46.0)
Hemoglobin: 11.8 g/dL — ABNORMAL LOW (ref 12.0–15.0)

## 2023-12-18 ENCOUNTER — Ambulatory Visit: Admitting: Student

## 2023-12-18 ENCOUNTER — Encounter: Payer: Self-pay | Admitting: Student

## 2023-12-18 VITALS — BP 126/66 | HR 55 | Ht 71.0 in | Wt 282.0 lb

## 2023-12-18 DIAGNOSIS — M1000 Idiopathic gout, unspecified site: Secondary | ICD-10-CM | POA: Diagnosis not present

## 2023-12-18 DIAGNOSIS — E039 Hypothyroidism, unspecified: Secondary | ICD-10-CM

## 2023-12-18 DIAGNOSIS — I5033 Acute on chronic diastolic (congestive) heart failure: Secondary | ICD-10-CM

## 2023-12-18 DIAGNOSIS — Z23 Encounter for immunization: Secondary | ICD-10-CM | POA: Diagnosis not present

## 2023-12-18 DIAGNOSIS — I5032 Chronic diastolic (congestive) heart failure: Secondary | ICD-10-CM

## 2023-12-18 DIAGNOSIS — I10 Essential (primary) hypertension: Secondary | ICD-10-CM | POA: Diagnosis not present

## 2023-12-18 DIAGNOSIS — M069 Rheumatoid arthritis, unspecified: Secondary | ICD-10-CM

## 2023-12-18 DIAGNOSIS — E782 Mixed hyperlipidemia: Secondary | ICD-10-CM

## 2023-12-18 DIAGNOSIS — E559 Vitamin D deficiency, unspecified: Secondary | ICD-10-CM | POA: Insufficient documentation

## 2023-12-18 DIAGNOSIS — N183 Chronic kidney disease, stage 3 unspecified: Secondary | ICD-10-CM

## 2023-12-18 MED ORDER — FUROSEMIDE 40 MG PO TABS: 40.0000 mg | ORAL_TABLET | Freq: Two times a day (BID) | ORAL | 3 refills | Status: AC

## 2023-12-18 MED ORDER — METOPROLOL TARTRATE 25 MG PO TABS
25.0000 mg | ORAL_TABLET | Freq: Two times a day (BID) | ORAL | 1 refills | Status: AC
Start: 2023-12-18 — End: ?

## 2023-12-18 MED ORDER — MONTELUKAST SODIUM 10 MG PO TABS: ORAL_TABLET | ORAL | 0 refills | Status: AC

## 2023-12-18 NOTE — Progress Notes (Signed)
 Established Patient Office Visit  Subjective   Patient ID: Michele Moore, female    DOB: [birth year]  Age: 73 y.o. MRN: [id number]  Chief Complaint  Patient presents with   Congestive Heart Failure    Michele Moore with medical hx listed below presents today for transfer of care. Please refer to problem based charting for further details and assessment and plan of current problem and chronic medical conditions.  Patient Active Problem List   Diagnosis Date Noted   Vitamin D  deficiency 12/18/2023   CHF (congestive heart failure) (HCC) 09/21/2023   Essential hypertension 09/21/2023   Gout 09/21/2023   Angiodysplasia of stomach and duodenum with hemorrhage 08/14/2022   Symptomatic anemia 12/18/2021   Normocytic anemia 04/22/2020   Lumbar spondylosis 02/16/2020   Primary osteoarthritis of left hip 08/08/2019   Non-seasonal allergic rhinitis 04/12/2019   Benign essential hypertension 03/06/2019   SI joint arthritis (left) 10/04/2018   CKD (chronic kidney disease), stage III (HCC) 09/30/2018   Psoriatic arthritis (HCC) 04/23/2018   Female pelvic pain 01/20/2018   History of revision of total knee arthroplasty 01/20/2018   Iron  deficiency anemia    Acute gastritis without hemorrhage    Gastric polyps    Polyp of sigmoid colon    Hypertension 10/01/2017   Hypothyroidism 10/01/2017   Gastroesophageal reflux disease 10/01/2017   Hypokalemia 10/01/2017   Mixed hyperlipidemia 10/01/2017   Reactive airway disease 10/01/2017   Bradycardia 08/24/2017   Severe obesity (BMI 35.0-35.9 with comorbidity) (HCC) 08/18/2017   Encounter for long-term (current) use of high-risk medication 06/25/2017   Rheumatoid arthritis (HCC) 06/17/2017   Cervical facet syndrome 06/16/2017   Primary osteoarthritis of right shoulder 06/16/2017   Sprain of anterior talofibular ligament of right ankle 06/16/2017   Chronic pain syndrome 04/21/2017   Spinal stenosis, lumbar region, with neurogenic claudication  04/21/2017   SS-A antibody positive 03/05/2017   Chronic knee pain after total replacement of left knee joint 08/03/2015   DOE (dyspnea on exertion) 12/28/2013      ROS Refer to HPI    Objective:     Outpatient Encounter Medications as of 12/18/2023  Medication Sig   acetaminophen  (TYLENOL ) 650 MG CR tablet Take 650 mg by mouth every 8 (eight) hours as needed for pain.   albuterol  (VENTOLIN  HFA) 108 (90 Base) MCG/ACT inhaler Inhale 2 puffs into the lungs every 6 (six) hours as needed for wheezing or shortness of breath.   allopurinol  (ZYLOPRIM ) 100 MG tablet Take 200 mg by mouth every morning.    betamethasone  dipropionate 0.05 % cream APPLY TO PSORIASIS ON HANDS TWICE DAILY ONLY. AVOID FACE, GROIN AND AXILLA   butalbital -acetaminophen -caffeine  (FIORICET ) 50-325-40 MG tablet Take 1 tablet by mouth every 6 (six) hours as needed for headache.   cetirizine (ZYRTEC) 10 MG tablet Take 10 mg by mouth daily.   Cholecalciferol  (VITAMIN D3) 2000 units TABS Take 2,000 Units by mouth daily.   fluconazole  (DIFLUCAN ) 200 MG tablet Take 200 mg by mouth once a week.   gabapentin  (NEURONTIN ) 600 MG tablet Take 1 tablet (600 mg total) by mouth at bedtime.   HYDROcodone -acetaminophen  (NORCO) 7.5-325 MG tablet Take 1 tablet by mouth every 8 (eight) hours as needed for moderate pain (pain score 4-6). Must last 30 days   hydrocortisone  2.5 % ointment Apply topically 2 (two) times daily as needed.   hydrOXYzine  (ATARAX ) 25 MG tablet Take 25 mg by mouth every 8 (eight) hours as needed.   JARDIANCE  10 MG TABS  tablet Take 10 mg by mouth every morning.   losartan  (COZAAR ) 25 MG tablet Take 25 mg by mouth daily.   Multiple Vitamins-Calcium  (ONE-A-DAY WOMENS FORMULA PO) Take 1 tablet by mouth daily.   omeprazole  (PRILOSEC) 40 MG capsule TAKE 1 CAPSULE(40 MG) BY MOUTH DAILY   tiZANidine  (ZANAFLEX ) 4 MG tablet Take 1 tablet (4 mg total) by mouth at bedtime.   triamcinolone  (KENALOG ) 0.1 % Apply 1 application  topically 2 (two) times daily.   [DISCONTINUED] furosemide  (LASIX ) 40 MG tablet Take 1 tablet (40 mg total) by mouth 2 (two) times daily.   [DISCONTINUED] levothyroxine  (SYNTHROID ) 100 MCG tablet Take 1 tablet (100 mcg total) by mouth daily at 6 (six) AM.   [DISCONTINUED] meclizine  (ANTIVERT ) 12.5 MG tablet TAKE 1 TABLET(12.5 MG) BY MOUTH THREE TIMES DAILY AS NEEDED FOR DIZZINESS   [DISCONTINUED] metoprolol  tartrate (LOPRESSOR ) 25 MG tablet Take 1 tablet (25 mg total) by mouth 2 (two) times daily.   [DISCONTINUED] montelukast  (SINGULAIR ) 10 MG tablet TAKE 1 TABLET(10 MG) BY MOUTH AT BEDTIME   [DISCONTINUED] mupirocin  ointment (BACTROBAN ) 2 % Apply 1 application topically 2 (two) times daily.   [DISCONTINUED] omega-3 acid ethyl esters (LOVAZA ) 1 g capsule Take 1 capsule (1 g total) by mouth 2 (two) times daily.   [DISCONTINUED] potassium chloride  SA (KLOR-CON  M) 20 MEQ tablet Take 1 tablet (20 mEq total) by mouth daily.   furosemide  (LASIX ) 40 MG tablet Take 1 tablet (40 mg total) by mouth 2 (two) times daily.   metoprolol  tartrate (LOPRESSOR ) 25 MG tablet Take 1 tablet (25 mg total) by mouth 2 (two) times daily.   montelukast  (SINGULAIR ) 10 MG tablet TAKE 1 TABLET(10 MG) BY MOUTH AT BEDTIME   [DISCONTINUED] fluticasone  (FLONASE ) 50 MCG/ACT nasal spray SHAKE LIQUID AND USE 1 SPRAY IN EACH NOSTRIL DAILY   [DISCONTINUED] mometasone  (NASONEX ) 50 MCG/ACT nasal spray Place 2 sprays into the nose daily.   No facility-administered encounter medications on file as of 12/18/2023.    BP 126/66   Pulse (!) 55   Ht 5' 11 (1.803 m)   Wt 282 lb (127.9 kg)   SpO2 99%   BMI 39.33 kg/m  BP Readings from Last 3 Encounters:  12/18/23 126/66  11/09/23 116/72  11/04/23 115/74    Physical Exam Constitutional:      Appearance: She is obese.     Comments: Chronically ill appearing.   HENT:     Mouth/Throat:     Mouth: Mucous membranes are moist.     Pharynx: Oropharynx is clear.  Cardiovascular:      Rate and Rhythm: Normal rate and regular rhythm.  Pulmonary:     Effort: Pulmonary effort is normal.     Breath sounds: No rhonchi or rales.  Abdominal:     General: Abdomen is flat. Bowel sounds are normal. There is no distension.     Palpations: Abdomen is soft.     Tenderness: There is no abdominal tenderness.  Musculoskeletal:        General: Normal range of motion.     Right lower leg: Edema (2+) present.     Left lower leg: Edema (2+) present.  Skin:    General: Skin is warm and dry.     Capillary Refill: Capillary refill takes less than 2 seconds.  Neurological:     General: No focal deficit present.     Mental Status: She is alert and oriented to person, place, and time.  Psychiatric:  Mood and Affect: Mood normal.        Behavior: Behavior normal.        11/09/2023    2:00 PM 11/04/2023    3:15 PM 10/13/2023    3:10 PM  Depression screen PHQ 2/9  Decreased Interest 0 0 0  Down, Depressed, Hopeless 0 0 0  PHQ - 2 Score 0 0 0       04/02/2023    3:03 PM 09/25/2022    3:17 PM 03/27/2022    3:33 PM 11/25/2021    2:52 PM  GAD 7 : Generalized Anxiety Score  Nervous, Anxious, on Edge 0 0 0 0  Control/stop worrying 0 0 0 0  Worry too much - different things 0 0 0 0  Trouble relaxing 0 0 0 0  Restless 0 0 0 0  Easily annoyed or irritable 0 0 0 0  Afraid - awful might happen 0 0 0 0  Total GAD 7 Score 0 0 0 0  Anxiety Difficulty Not difficult at all Not difficult at all Not difficult at all     Results for orders placed or performed in visit on 12/18/23  Uric acid  Result Value Ref Range   Uric Acid 5.8 3.1 - 7.9 mg/dL  TSH + free T4  Result Value Ref Range   TSH 9.330 (H) 0.450 - 4.500 uIU/mL   Free T4 1.35 0.82 - 1.77 ng/dL  Lipid Profile  Result Value Ref Range   Cholesterol, Total 219 (H) 100 - 199 mg/dL   Triglycerides 816 (H) 0 - 149 mg/dL   HDL 55 >60 mg/dL   VLDL Cholesterol Cal 32 5 - 40 mg/dL   LDL Chol Calc (NIH) 867 (H) 0 - 99 mg/dL   Chol/HDL  Ratio 4.0 0.0 - 4.4 ratio    Last CBC Lab Results  Component Value Date   WBC 4.9 11/03/2023   HGB 11.8 (L) 12/16/2023   HCT 35.6 (L) 12/16/2023   MCV 92.0 11/03/2023   MCH 30.0 11/03/2023   RDW 14.6 11/03/2023   PLT 157 11/03/2023   Last metabolic panel Lab Results  Component Value Date   GLUCOSE 100 (H) 10/19/2023   NA 144 10/19/2023   K 4.2 10/19/2023   CL 105 10/19/2023   CO2 29 10/19/2023   BUN 24 (H) 10/19/2023   CREATININE 1.80 (H) 10/19/2023   GFRNONAA 29 (L) 10/19/2023   CALCIUM  10.1 10/19/2023   PHOS 3.0 09/25/2022   PROT 6.7 04/02/2023   ALBUMIN 4.3 04/02/2023   LABGLOB 2.4 04/02/2023   AGRATIO 1.7 07/01/2022   BILITOT 0.2 04/02/2023   ALKPHOS 104 04/02/2023   AST 29 04/02/2023   ALT 14 04/02/2023   ANIONGAP 10 10/19/2023   Last lipids Lab Results  Component Value Date   CHOL 219 (H) 12/18/2023   HDL 55 12/18/2023   LDLCALC 132 (H) 12/18/2023   TRIG 183 (H) 12/18/2023   CHOLHDL 4.0 12/18/2023   Last hemoglobin A1c Lab Results  Component Value Date   HGBA1C 5.3 11/25/2021   Last thyroid  functions Lab Results  Component Value Date   TSH 9.330 (H) 12/18/2023   T4TOTAL 10.0 03/27/2022   FREET4 1.35 12/18/2023    The 10-year ASCVD risk score (Arnett DK, et al., 2019) is: 34.5%    Assessment & Plan:  Hypertension, unspecified type Assessment & Plan: Normotensive today, continue lasix , losartan  25 mg daily, metoprolol  25 mg BID,   Orders: -     Metoprolol  Tartrate; Take  1 tablet (25 mg total) by mouth 2 (two) times daily.  Dispense: 180 tablet; Refill: 1  Acute on chronic diastolic CHF (congestive heart failure) (HCC) Assessment & Plan: Improvement on LE edema, continue lasix  40 mg twice daily  Orders: -     Furosemide ; Take 1 tablet (40 mg total) by mouth 2 (two) times daily.  Dispense: 60 tablet; Refill: 3  Idiopathic gout, unspecified chronicity, unspecified site Assessment & Plan: Uric acid today. Continue allopurinol . Follows  with rheumatology   Orders: -     Uric acid  Hypothyroidism, unspecified type Assessment & Plan: On levothryoxine 100 mcg daily, reports she used to be on higher dose. TSH, T4 today. Continue current medication, will adjust dose based on labwork.   Orders: -     TSH + free T4  Mixed hyperlipidemia Assessment & Plan: Appears previously on statin but seems like it was not continue after discharge in July. Lipid panel today. Will start on atorvastatin  if risk elevated.    Orders: -     Lipid panel  Encounter for immunization -     Flu vaccine HIGH DOSE PF(Fluzone Trivalent) -     Varicella-zoster vaccine IM  Stage 3 chronic kidney disease, unspecified whether stage 3a or 3b CKD (HCC) Assessment & Plan: CKD IIIb   Rheumatoid arthritis, involving unspecified site, unspecified whether rheumatoid factor present (HCC)  Chronic diastolic congestive heart failure (HCC) Assessment & Plan: Improvement on LE edema, continue lasix  40 mg twice daily   Other orders -     Montelukast  Sodium; TAKE 1 TABLET(10 MG) BY MOUTH AT BEDTIME  Dispense: 90 tablet; Refill: 0     Return in about 4 months (around 04/19/2024) for HLD, vitamin D .    Harlene Saddler, MD

## 2023-12-19 LAB — LIPID PANEL
Chol/HDL Ratio: 4 ratio (ref 0.0–4.4)
Cholesterol, Total: 219 mg/dL — ABNORMAL HIGH (ref 100–199)
HDL: 55 mg/dL (ref >39)
LDL Chol Calc (NIH): 132 mg/dL — ABNORMAL HIGH (ref 0–99)
Triglycerides: 183 mg/dL — ABNORMAL HIGH (ref 0–149)
VLDL Cholesterol Cal: 32 mg/dL (ref 5–40)

## 2023-12-19 LAB — TSH+FREE T4
Free T4: 1.35 ng/dL (ref 0.82–1.77)
TSH: 9.33 u[IU]/mL — ABNORMAL HIGH (ref 0.450–4.500)

## 2023-12-19 LAB — URIC ACID: Uric Acid: 5.8 mg/dL (ref 3.1–7.9)

## 2023-12-21 ENCOUNTER — Other Ambulatory Visit: Payer: Self-pay | Admitting: Student

## 2023-12-21 ENCOUNTER — Ambulatory Visit: Payer: Self-pay | Admitting: Student

## 2023-12-21 ENCOUNTER — Telehealth: Payer: Self-pay

## 2023-12-21 MED ORDER — ATORVASTATIN CALCIUM 40 MG PO TABS: 40.0000 mg | ORAL_TABLET | Freq: Every day | ORAL | 3 refills | Status: AC

## 2023-12-21 NOTE — Telephone Encounter (Signed)
 Copied from CRM [id number]. Topic: Clinical - Medication Question >> Dec 21, 2023 10:56 AM Myrick T wrote: Reason for CRM: advised patient of labs results and wants to know if she would need to take the Omega3 along with atorvastatin  40 mg and levothyroxine  125mcg. Please f/u with patient

## 2023-12-21 NOTE — Progress Notes (Signed)
 LMOM for patient to call back. E2C2 can give lab results. JM

## 2023-12-21 NOTE — Telephone Encounter (Signed)
 Patient states she needs medication for Thyroid  medication and her statin in . I let her know you sent in levothyroxine  but I did not see atorvastatin .  JM

## 2023-12-22 ENCOUNTER — Other Ambulatory Visit: Payer: Self-pay | Admitting: Student

## 2023-12-22 ENCOUNTER — Ambulatory Visit: Payer: Self-pay

## 2023-12-22 DIAGNOSIS — E782 Mixed hyperlipidemia: Secondary | ICD-10-CM

## 2023-12-22 NOTE — Telephone Encounter (Signed)
 FYI Only or Action Required?: Action required by provider: clinical question for provider. Pt asking if she can have an rx for the anal burning she is having, pt states she spoke to PCP about it last week. Pt states she also has ketoconazole  at home and is wondering if she can use that on the area that is burning.   Patient was last seen in primary care on 12/18/2023 by Lemon Raisin, MD.  Called Nurse Triage reporting Rectal Pain.  Symptoms began a week ago.  Interventions attempted: Nothing.  Symptoms are: stable.  Triage Disposition: Home Care  Patient/caregiver understands and will follow disposition?: No, wishes to speak with PCP  Copied from CRM #[id number]. Topic: Clinical - Prescription Issue >> Dec 22, 2023 10:39 AM Darshell M wrote: Reason for CRM: Patient experiencing pain in anus due to access moisture. Pain, burning., difficulty sitting. Discussed with provider. Requesting medication/cream to assist. CB# [phone]. Reason for Disposition  Mild rectal pain  Answer Assessment - Initial Assessment Questions 1. SYMPTOM:  What's the main symptom you're concerned about? (e.g., pain, itching, swelling, rash)     Burning in butt crack from moisture 2. ONSET: When did the burning  start?     Ongoing, states she spoke to PCP about it Friday Pt states she that does not want to talk about it. Requesting medication, states she spoke to PCP about this last week. Pt also asking if she can use ketoconazole  on the area as she has that medication in possession at home. Requesting call back as soon as PCP makes a decision.  Protocols used: Rectal Symptoms-A-AH

## 2023-12-22 NOTE — Telephone Encounter (Unsigned)
 Copied from CRM [id number]. Topic: Clinical - Medication Refill >> Dec 22, 2023 10:28 AM Darshell M wrote: Medication:   levothyroxine  (SYNTHROID ) 100 MCG tablet   Has the patient contacted their pharmacy? Yes (Agent: If no, request that the patient contact the pharmacy for the refill. If patient does not wish to contact the pharmacy document the reason why and proceed with request.) (Agent: If yes, when and what did the pharmacy advise?)  This is the patient's preferred pharmacy:  [id number], [place] - [address] AT [facility] OF [address] [place] [address] Phone: [phone] Fax: [phone]  Is this the correct pharmacy for this prescription? Yes - Pharmacist advised patient medication had not been called in.  If no, delete pharmacy and type the correct one.   Has the prescription been filled recently? No  Is the patient out of the medication? Yes  Has the patient been seen for an appointment in the last year OR does the patient have an upcoming appointment? Yes  Can we respond through MyChart? No  Agent: Please be advised that Rx refills may take up to 3 business days. We ask that you follow-up with your pharmacy.

## 2023-12-22 NOTE — Telephone Encounter (Signed)
 Please review and advise.

## 2023-12-23 ENCOUNTER — Encounter: Payer: Self-pay | Admitting: Student

## 2023-12-23 MED ORDER — OMEGA-3-ACID ETHYL ESTERS 1 G PO CAPS: 1.0000 | ORAL_CAPSULE | Freq: Two times a day (BID) | ORAL | 0 refills | Status: AC

## 2023-12-23 MED ORDER — LEVOTHYROXINE SODIUM 100 MCG PO TABS: 100.0000 ug | ORAL_TABLET | Freq: Every day | ORAL | 0 refills | Status: DC

## 2023-12-23 NOTE — Telephone Encounter (Signed)
 Call patient, there was no answer to my phone. Left voicemail message to call our office back.

## 2023-12-23 NOTE — Assessment & Plan Note (Signed)
 Normotensive today, continue lasix , losartan  25 mg daily, metoprolol  25 mg BID,

## 2023-12-23 NOTE — Assessment & Plan Note (Signed)
 CKDIIIb

## 2023-12-23 NOTE — Assessment & Plan Note (Addendum)
 Improvement on LE edema, continue lasix  40 mg twice daily

## 2023-12-23 NOTE — Assessment & Plan Note (Signed)
 On levothryoxine 100 mcg daily, reports she used to be on higher dose. TSH, T4 today. Continue current medication, will adjust dose based on labwork.

## 2023-12-23 NOTE — Assessment & Plan Note (Signed)
 Appears previously on statin but seems like it was not continue after discharge in July. Lipid panel today. Will start on atorvastatin  if risk elevated.

## 2023-12-23 NOTE — Assessment & Plan Note (Signed)
 Uric acid today. Continue allopurinol . Follows with rheumatology

## 2023-12-24 ENCOUNTER — Other Ambulatory Visit: Payer: Self-pay | Admitting: Student

## 2023-12-24 ENCOUNTER — Ambulatory Visit (INDEPENDENT_AMBULATORY_CARE_PROVIDER_SITE_OTHER)

## 2023-12-24 ENCOUNTER — Other Ambulatory Visit: Payer: Self-pay

## 2023-12-24 DIAGNOSIS — I5032 Chronic diastolic (congestive) heart failure: Secondary | ICD-10-CM

## 2023-12-24 DIAGNOSIS — E039 Hypothyroidism, unspecified: Secondary | ICD-10-CM

## 2023-12-24 LAB — CUP PACEART REMOTE DEVICE CHECK
Date Time Interrogation Session: 20251029231922
Implantable Pulse Generator Implant Date: 20230629

## 2023-12-24 MED ORDER — HYDROCORTISONE 2.5 % EX OINT: TOPICAL_OINTMENT | Freq: Two times a day (BID) | CUTANEOUS | 0 refills | Status: AC | PRN

## 2023-12-24 NOTE — Progress Notes (Signed)
 Remote Loop Recorder Transmission

## 2023-12-24 NOTE — Telephone Encounter (Signed)
 Patient verbalized understanding and requested a refill for ointment

## 2023-12-24 NOTE — Telephone Encounter (Signed)
 Call patient, there was no answer to my phone. No voicemail message left.

## 2023-12-24 NOTE — Telephone Encounter (Signed)
 Patient would like a refill on hydrocortisone  2.5% ointment. Please send in Rx.

## 2023-12-27 ENCOUNTER — Ambulatory Visit: Payer: Self-pay | Admitting: Cardiology

## 2024-01-06 ENCOUNTER — Ambulatory Visit: Attending: Nurse Practitioner | Admitting: Nurse Practitioner

## 2024-01-06 ENCOUNTER — Encounter: Payer: Self-pay | Admitting: Nurse Practitioner

## 2024-01-06 VITALS — BP 131/75 | HR 70 | Temp 97.6°F | Resp 18 | Ht 71.0 in | Wt 279.0 lb

## 2024-01-06 DIAGNOSIS — M5136 Other intervertebral disc degeneration, lumbar region with discogenic back pain only: Secondary | ICD-10-CM | POA: Diagnosis present

## 2024-01-06 DIAGNOSIS — G894 Chronic pain syndrome: Secondary | ICD-10-CM | POA: Diagnosis present

## 2024-01-06 DIAGNOSIS — M47816 Spondylosis without myelopathy or radiculopathy, lumbar region: Secondary | ICD-10-CM | POA: Insufficient documentation

## 2024-01-06 DIAGNOSIS — M1612 Unilateral primary osteoarthritis, left hip: Secondary | ICD-10-CM | POA: Insufficient documentation

## 2024-01-06 DIAGNOSIS — M47812 Spondylosis without myelopathy or radiculopathy, cervical region: Secondary | ICD-10-CM | POA: Diagnosis not present

## 2024-01-06 DIAGNOSIS — M461 Sacroiliitis, not elsewhere classified: Secondary | ICD-10-CM | POA: Insufficient documentation

## 2024-01-06 DIAGNOSIS — M5416 Radiculopathy, lumbar region: Secondary | ICD-10-CM | POA: Insufficient documentation

## 2024-01-06 DIAGNOSIS — Z79899 Other long term (current) drug therapy: Secondary | ICD-10-CM | POA: Insufficient documentation

## 2024-01-06 MED ORDER — HYDROCODONE-ACETAMINOPHEN 7.5-325 MG PO TABS: 1.0000 | ORAL_TABLET | Freq: Three times a day (TID) | ORAL | 0 refills | Status: AC | PRN

## 2024-01-06 MED ORDER — GABAPENTIN 600 MG PO TABS
600.0000 mg | ORAL_TABLET | Freq: Every day | ORAL | 5 refills | Status: AC
Start: 2024-01-06 — End: 2024-07-04

## 2024-01-06 NOTE — Progress Notes (Signed)
 Nursing Pain Medication Assessment:  Safety precautions to be maintained throughout the outpatient stay will include: orient to surroundings, keep bed in low position, maintain call bell within reach at all times, provide assistance with transfer out of bed and ambulation.  Medication Inspection Compliance: Pill count conducted under aseptic conditions, in front of the patient. Neither the pills nor the bottle was removed from the patient's sight at any time. Once count was completed pills were immediately returned to the patient in their original bottle.  Medication: Hydrocodone /APAP Pill/Patch Count: 30 of 90 pills/patches remain Pill/Patch Appearance: Markings consistent with prescribed medication Bottle Appearance: Standard pharmacy container. Clearly labeled. Filled Date: 80 / 23 / 2025 Last Medication intake:  Today

## 2024-01-06 NOTE — Progress Notes (Signed)
 PROVIDER NOTE: Interpretation of information contained herein should be left to medically-trained personnel. Specific patient instructions are provided elsewhere under Patient Instructions section of medical record. This document was created in part using AI and STT-dictation technology, any transcriptional errors that may result from this process are unintentional.  Patient: Michele Moore  Service: E/M   PCP: Michele Raisin, MD  DOB: 11/17/1950  DOS: 01/06/2024  Provider: Emmy MARLA Blanch, NP  MRN: [id number]  Delivery: Face-to-face  Specialty: Interventional Pain Management  Type: Established Patient  Setting: Ambulatory outpatient facility  Specialty designation: 09  Referring Prov.: Michele Raisin, MD  Location: Outpatient office facility       History of present illness (HPI) Michele Moore, a 73 y.o. year old female, is here today because of her Lumbar spondylosis [M47.816]. Michele Moore's primary complain today is Back Pain  Pertinent problems: Michele Moore has Chronic knee pain after total replacement of left knee joint; Lumbar radiculopathy; Lumbar degenerative disc disease; Chronic pain syndrome; Spinal stenosis, lumbar region, with neurogenic claudication; Cervical facet syndrome; Primary osteoarthritis of right shoulder; Primary osteoarthritis of left hip; and Lumbar spondylosis on their pertinent problem list.   Pain Assessment: Severity of Chronic pain is reported as a 10-Worst pain ever/10. Location: Back Lower/Denies. Onset: More than a month ago. Quality: Stabbing, Sharp. Timing: Constant. Modifying factor(s): Denies. Vitals:  height is 5' 11 (1.803 m) and weight is 279 lb (126.6 kg). Her temporal temperature is 97.6 F (36.4 C). Her blood pressure is 131/75 and her pulse is 70. Her respiration is 18 and oxygen saturation is 97%.  BMI: Estimated body mass index is 38.91 kg/m as calculated from the following:   Height as of this encounter: 5' 11 (1.803 m).   Weight as of this encounter:  279 lb (126.6 kg).  Last encounter: 10/13/2023. Last procedure: Visit date not found.  Reason for encounter: medication management. No change in medical history since last visit.  Patient's pain is at baseline.  Patient continues multimodal pain regimen as prescribed.  States that it provides pain relief and improvement in functional status.   Discussed the use of AI scribe software for clinical note transcription with the patient, who gave verbal consent to proceed.  History of Present Illness   Michele Moore is a 73 year old female who presents with worsening lower back pain.  She experiences severe lower back pain that has progressively worsened, describing it as debilitating and stating that she 'can't even walk' and has difficulty getting up from the commode. The pain is exacerbated by activities such as driving and lying in bed, where she requires a pillow for support. It is particularly severe at night, affecting her sleep. She reports that her kidney doctor, Dr. Marcelino, told her two weeks ago that she has a bad spine, a bulging disc, and a pinched nerve. She is currently taking Michele Moore  (Norco) 7.5-325 mg and gabapentin  at night for pain management, with no reported side effects.    Pharmacotherapy Assessment   Michele Moore  (Norco) 7.5-325 mg every 8 hours as needed for pain. MME=22.50 Gabapentin  600 mg total by mouth at bedtime Monitoring: [place] PMP: PDMP reviewed during this encounter.       Pharmacotherapy: No side-effects or adverse reactions reported. Compliance: No problems identified. Effectiveness: Clinically acceptable.  Michele Moore, [place]  01/06/2024  1:42 PM  Sign when Signing Visit Nursing Pain Medication Assessment:  Safety precautions to be maintained throughout the outpatient stay will include: orient to surroundings,  keep bed in low position, maintain call bell within reach at all times, provide assistance with transfer out of bed and  ambulation.  Medication Inspection Compliance: Pill count conducted under aseptic conditions, in front of the patient. Neither the pills nor the bottle was removed from the patient's sight at any time. Once count was completed pills were immediately returned to the patient in their original bottle.  Medication: Michele /APAP Pill/Patch Count: 30 of 90 pills/patches remain Pill/Patch Appearance: Markings consistent with prescribed medication Bottle Appearance: Standard pharmacy container. Clearly labeled. Filled Date: 71 / 23 / 2025 Last Medication intake:  Today    UDS:  Summary  Date Value Ref Range Status  07/09/2023 FINAL  Final    Comment:    ==================================================================== [employer] Select 13 (MW) ==================================================================== Test                             Result       Flag       Units  Drug Present and Declared for Prescription Verification   Michele                     619          EXPECTED   ng/mg creat   Norhydrocodone                 111          EXPECTED   ng/mg creat    Sources of Michele  include scheduled prescription medications.    Norhydrocodone is an expected metabolite of Michele .  ==================================================================== Test                      Result    Flag   Units      Ref Range   Creatinine              54               mg/dL      >=79 ==================================================================== Declared Medications:  The flagging and interpretation on this report are based on the  following declared medications.  Unexpected results may arise from  inaccuracies in the declared medications.   **Note: The testing scope of this panel includes these medications:   Michele  (Norco)   **Note: The testing scope of this panel does not include the  following reported medications:   Acetaminophen  (Tylenol )  Acetaminophen   (Norco)  Albuterol  (Ventolin  HFA)  Allopurinol  (Zyloprim )  Apremilast  (Otezla )  Calcium   Cetirizine (Zyrtec)  Clobetasol  (Temovate )  Dapagliflozin  (Farxiga )  Dupilumab  Empagliflozin  (Jardiance )  Furosemide  (Lasix )  Gabapentin  (Neurontin )  Levothyroxine  (Synthroid )  Losartan  (Cozaar )  Meclizine  (Antivert )  Metoprolol  (Lopressor )  Montelukast  (Singulair )  Multivitamin  Nystatin  (Mycostatin )  Omega-3-Acid  Ethyl Esters (Lovaza )  Omeprazole  (Prilosec)  Polyethylene Glycol  Potassium (Klor-Con )  Prednisone   Tizanidine  (Zanaflex )  Topical  Topical Diclofenac (Voltaren)  Topical Ketoconazole   Topical Mupirocin   Upadacitinib  Michele Moore ==================================================================== For clinical consultation, please call [phone]. ====================================================================     No results found for: CBDTHCR No results found for: D8THCCBX No results found for: D9THCCBX  ROS  Constitutional: Denies any fever or chills Gastrointestinal: No reported hemesis, hematochezia, vomiting, or acute GI distress Musculoskeletal: Low back pain Neurological: No reported episodes of acute onset apraxia, aphasia, dysarthria, agnosia, amnesia, paralysis, loss of coordination, or loss of consciousness  Medication Review  Michele Moore, Michele Moore , Michele Moore , Michele Moore, acetaminophen , albuterol , allopurinol , atorvastatin , betamethasone   dipropionate, butalbital -acetaminophen -caffeine , cetirizine, empagliflozin , fluconazole , fluticasone , furosemide , gabapentin , hydrOXYzine , hydrocortisone , levothyroxine , losartan , metoprolol  tartrate, mometasone , montelukast , omega-3 acid ethyl esters, omeprazole , tiZANidine , and triamcinolone  cream  History Review  Allergy: Michele Moore is allergic to ampicillin, penicillins, vancomycin, and vibramycin  [doxycycline  calcium ]. Drug: Michele Moore  reports no history of drug  use. Alcohol :  reports no history of alcohol  use. Tobacco:  reports that she quit smoking about 30 years ago. Her smoking use included cigarettes. She started smoking about 50 years ago. She has a 40 pack-year smoking history. She has never used smokeless tobacco. Social: Michele Moore  reports that she quit smoking about 30 years ago. Her smoking use included cigarettes. She started smoking about 50 years ago. She has a 40 pack-year smoking history. She has never used smokeless tobacco. She reports that she does not drink alcohol  and does not use drugs. Medical:  has a past medical history of (HFpEF) heart failure with preserved ejection fraction (HCC) (01/13/2014), Allergy, ANA positive, Anemia of chronic renal failure, stage 3 (moderate), unspecified whether stage 3a or 3b CKD (HCC), Aortic atherosclerosis, Asthma, Burn, Cataract, Chronic pain syndrome, Chronic, continuous use of opioids, CKD (chronic kidney disease), stage III (HCC), Complication of anesthesia, DDD (degenerative disc disease), lumbar, Dyspnea on exertion, Dysrhythmia, Family history of adverse reaction to anesthesia, GERD (gastroesophageal reflux disease), Glaucoma, Gout, H/O tooth extraction, Hemorrhoid, History of hiatal hernia, History of kidney stones, Hypertension, Hypothyroidism, Implantable loop recorder present (08/22/2021), Long term current use of immunosuppressive drug, Migraines, Mixed hyperlipidemia, Michele gastric ulcers, Osteoarthritis, Psoriasis, Psoriatic arthritis (HCC), Rheumatoid arthritis (HCC), S/P revision of total knee (10/09/2021), SS-A antibody positive, and Wears dentures. Surgical: Michele Moore  has a past surgical history that includes Carpal tunnel release; Rotator cuff repair (Right, 1995); Ankle surgery (Right); Tubal ligation; Colonoscopy (2000?); Upper gi endoscopy (2000?); Tonsillectomy; Fusion of talonavicular joint (Right, 08/03/2015); Colonoscopy with propofol  (N/A, 10/22/2017); Esophagogastroduodenoscopy  (egd) with propofol  (N/A, 10/22/2017); polypectomy (N/A, 10/22/2017); Total knee revision (Right, 01/20/2018); Joint replacement (Right); Loop recorder implant (08/22/2021); Total knee revision (Left, 10/09/2021); Colonoscopy with propofol  (N/A, 08/14/2022); Esophagogastroduodenoscopy (egd) with propofol  (N/A, 08/14/2022); Hot hemostasis (08/14/2022); and polypectomy (08/14/2022). Family: family history includes Arthritis in her mother; COPD in her sister; Cancer in her maternal aunt and maternal uncle; Diabetes in her father; Gout in her mother; Heart failure in her father and mother; Hyperlipidemia in her father; Hypertension in her mother; Stroke in her mother.  Laboratory Chemistry Profile   Renal Lab Results  Component Value Date   BUN 24 (H) 10/19/2023   CREATININE 1.80 (H) 10/19/2023   BCR 12 04/02/2023   GFRAA 30 (L) 08/28/2019   GFRNONAA 29 (L) 10/19/2023    Hepatic Lab Results  Component Value Date   AST 29 04/02/2023   ALT 14 04/02/2023   ALBUMIN 4.3 04/02/2023   ALKPHOS 104 04/02/2023    Electrolytes Lab Results  Component Value Date   NA 144 10/19/2023   K 4.2 10/19/2023   CL 105 10/19/2023   CALCIUM  10.1 10/19/2023   MG 2.1 10/26/2022   PHOS 3.0 09/25/2022    Bone No results found for: VD25OH, VD125OH2TOT, CI6874NY7, CI7874NY7, 25OHVITD1, 25OHVITD2, 25OHVITD3, TESTOFREE, TESTOSTERONE  Inflammation (CRP: Acute Phase) (ESR: Chronic Phase) Lab Results  Component Value Date   CRP 1.6 (H) 09/27/2021   ESRSEDRATE 79 (H) 09/27/2021         Note: Above Lab results reviewed.  Recent Imaging Review  CUP PACEART REMOTE DEVICE CHECK ILR summary report received. Battery status OK. Normal  device function. No new symptom, tachy, brady, or pause episodes. No new AF episodes. Monthly summary reports and ROV/PRN.  3 false AF events c/w SR/ST with ectopy and some oversensing, duration  6-37min, HR's 98-115.  Pt does have a hx of SCAF, no OAC per EPIC. [place],  CVRS Note: Reviewed        Physical Exam  Vitals: BP 131/75 (BP Location: Right Arm, Patient Position: Sitting, Cuff Size: Large)   Pulse 70   Temp 97.6 F (36.4 C) (Temporal)   Resp 18   Ht 5' 11 (1.803 m)   Wt 279 lb (126.6 kg)   SpO2 97%   BMI 38.91 kg/m  BMI: Estimated body mass index is 38.91 kg/m as calculated from the following:   Height as of this encounter: 5' 11 (1.803 m).   Weight as of this encounter: 279 lb (126.6 kg). Ideal: Ideal body weight: 70.8 kg (156 lb 1.4 oz) Adjusted ideal body weight: 93.1 kg (205 lb 4 oz) General appearance: Well nourished, well developed, and well hydrated. In no apparent acute distress Mental status: Alert, oriented x 3 (person, place, & time)       Respiratory: No evidence of acute respiratory distress Eyes: PERLA  Musculoskeletal: +LBP Lumbar Exam  Skin & Axial Inspection: No masses, redness, or swelling Alignment: Symmetrical Functional ROM: Pain restricted ROM       Stability: No instability detected Muscle Tone/Strength: Functionally intact. No obvious neuro-muscular anomalies detected. Sensory (Neurological): lumbar facet pain  Palpation: No palpable anomalies       Provocative Tests: Hyperextension/rotation test: (+) bilaterally for facet joint pain. Lumbar quadrant test (Kemp's test): (+) bilaterally for facet joint pain. Lateral bending test: deferred today       Patrick's Maneuver: deferred today                   FABER* test: deferred today                   S-I anterior distraction/compression test: deferred today         S-I lateral compression test: deferred today         S-I Thigh-thrust test: deferred today         S-I Gaenslen's test: deferred today         *(Flexion, ABduction and External Rotation)   Lower Extremity Exam      Side: Right lower extremity   Side: Left lower extremity  Stability: No instability observed           Stability: No instability observed          Skin & Extremity Inspection: Skin  color, temperature, and hair growth are WNL. No peripheral edema or cyanosis. No masses, redness, swelling, asymmetry, or associated skin lesions. No contractures.   Skin & Extremity Inspection: Skin color, temperature, and hair growth are WNL. No peripheral edema or cyanosis. No masses, redness, swelling, asymmetry, or associated skin lesions. No contractures.  Functional ROM: Unrestricted ROM                   Functional ROM: Decreased ROM for hip and knee joints          Muscle Tone/Strength: Functionally intact. No obvious neuro-muscular anomalies detected.   Muscle Tone/Strength: Functionally intact. No obvious neuro-muscular anomalies detected.  Sensory (Neurological): Unimpaired   Sensory (Neurological): Dermatomal pain pattern and arthropathic of left knee  Palpation: No palpable anomalies   Palpation: No palpable anomalies  Assessment   Diagnosis Status  1. Lumbar spondylosis   2. Chronic pain syndrome   3. Cervical spondylosis without myelopathy   4. Degeneration of intervertebral disc of lumbar region with discogenic back pain   5. Lumbar radiculopathy   6. Primary osteoarthritis of left hip   7. SI joint arthritis    Persistent Persistent Controlled   Updated Problems: No problems updated.  Plan of Care  Problem-specific:  Assessment and Plan    Chronic pain syndrome Significant lower back pain affecting sleep and mobility. Current management with Michele  and gabapentin  effective without side effects. - Continue Michele  7.5-325 mg (Norco). - Continue gabapentin  at nighttime.  Lumbar spondylosis Worsening symptoms with severe lower back pain and difficulty walking.  - Order MRI lumbar spine for further evaluation - Schedule follow up after MRI results  Medication management: Patient's pain is controlled with Michele  and gabapentin , will continue on current medication regimen.  Prescribing drug monitoring (PDMP) PDMP reviewed; findings consistent with  the use of prescribed medication and no evidence of narcotic misuse or abuse.  Urine drug screen (UDS) up-to-date. No side effects or adverse reaction reported to medication. Schedule follow up in 90 days for medication management           [name] has a current medication list which includes the following long-term medication(s): albuterol , allopurinol , atorvastatin , cetirizine, furosemide , gabapentin , levothyroxine , metoprolol  tartrate, montelukast , omega-3 acid ethyl esters, omeprazole , [DISCONTINUED] fluticasone , and [DISCONTINUED] mometasone .  Pharmacotherapy (Medications Ordered): Meds ordered this encounter  Medications   Michele Moore  (NORCO) 7.5-325 MG tablet    Sig: Take 1 tablet by mouth every 8 (eight) hours as needed for moderate pain (pain score 4-6). Must last 30 days    Dispense:  90 tablet    Refill:  0   Michele Moore  (NORCO) 7.5-325 MG tablet    Sig: Take 1 tablet by mouth every 8 (eight) hours as needed for moderate pain (pain score 4-6). Must last 30 days    Dispense:  90 tablet    Refill:  0   Michele Moore  (NORCO) 7.5-325 MG tablet    Sig: Take 1 tablet by mouth every 8 (eight) hours as needed for moderate pain (pain score 4-6). Must last 30 days    Dispense:  90 tablet    Refill:  0   gabapentin  (NEURONTIN ) 600 MG tablet    Sig: Take 1 tablet (600 mg total) by mouth at bedtime.    Dispense:  30 tablet    Refill:  5   Orders:  Orders Placed This Encounter  Procedures   MR LUMBAR SPINE WO CONTRAST    Patient presents with axial pain with possible radicular component. Please assist us  in identifying specific level(s) and laterality of any additional findings such as: 1. Facet (Zygapophyseal) joint DJD (Hypertrophy, space narrowing, subchondral sclerosis, and/or osteophyte formation) 2. DDD and/or IVDD (Loss of disc height, desiccation, gas patterns, osteophytes, endplate sclerosis, or Black disc disease) 3. Pars  defects 4. Spondylolisthesis, spondylosis, and/or spondyloarthropathies (include Degree/Grade of displacement in mm) (stability) 5. Vertebral body Fractures (acute/chronic) (state percentage of collapse) 6. Demineralization (osteopenia/osteoporotic) 7. Bone pathology 8. Foraminal narrowing  9. Surgical changes 10. Central, Lateral Recess, and/or Foraminal Stenosis (include AP diameter of stenosis in mm) 11. Surgical changes (hardware type, status, and presence of fibrosis) 12. Modic Type Changes (MRI only) 13. IVDD (Disc bulge, protrusion, herniation, extrusion) (Level, laterality, extent)    Standing Status:   Future    Expiration Date:   04/07/2024  Scheduling Instructions:     Please make sure that the patient understands that this needs to be done as soon as possible. Never have the patient do the imaging just before the next appointment. Inform patient that having the imaging done within the [facility] will expedite the availability of the results and will provide      imaging availability to the requesting physician. In addition inform the patient that the imaging order has an expiration date and will not be renewed if not done within the active period.    What is the patient's sedation requirement?:   No Sedation    Does the patient have a pacemaker or implanted devices?:   No    Preferred imaging location?:   [facility] (table limit-350lbs)    Call Results- Best Contact Number?:   [phone] [facility]    Radiology Contrast Protocol - do NOT remove file path:   [email or web address]        Return in about 3 weeks (around 01/27/2024) for (F2F), review of ordered tests, Michele Blanch NP.    Recent Visits Date Type Provider Dept  10/13/23 Office Visit Romey Cohea K, NP [facility]  Showing recent visits within past 90 days and meeting all other requirements Today's Visits Date  Type Provider Dept  01/06/24 Office Visit Xylan Sheils K, NP [facility]  Showing today's visits and meeting all other requirements Future Appointments Date Type Provider Dept  03/29/24 Appointment Burke Terry K, NP [facility]  Showing future appointments within next 90 days and meeting all other requirements  I discussed the assessment and treatment plan with the patient. The patient was provided an opportunity to ask questions and all were answered. The patient agreed with the plan and demonstrated an understanding of the instructions.  Patient advised to call back or seek an in-person evaluation if the symptoms or condition worsens.  I personally spent a total of 30 minutes in the care of the patient today including preparing to see the patient, getting/reviewing separately obtained history, performing a medically appropriate exam/evaluation, counseling and educating, placing orders, referring and communicating with other health care professionals, documenting clinical information in the EHR, independently interpreting results, communicating results, and coordinating care.   Note by: Melinda Gwinner K Marquan Vokes, NP (TTS and AI technology used. I apologize for any typographical errors that were not detected and corrected.) Date: 01/06/2024; Time: 2:09 PM

## 2024-01-24 ENCOUNTER — Ambulatory Visit

## 2024-01-25 ENCOUNTER — Encounter

## 2024-01-27 ENCOUNTER — Emergency Department: Admission: EM | Admit: 2024-01-27 | Discharge: 2024-01-28 | Disposition: A

## 2024-01-27 ENCOUNTER — Ambulatory Visit: Attending: Cardiology

## 2024-01-27 ENCOUNTER — Emergency Department

## 2024-01-27 ENCOUNTER — Ambulatory Visit: Payer: Self-pay

## 2024-01-27 ENCOUNTER — Inpatient Hospital Stay: Attending: Oncology

## 2024-01-27 ENCOUNTER — Encounter: Payer: Self-pay | Admitting: Emergency Medicine

## 2024-01-27 ENCOUNTER — Other Ambulatory Visit: Payer: Self-pay

## 2024-01-27 DIAGNOSIS — J069 Acute upper respiratory infection, unspecified: Secondary | ICD-10-CM | POA: Insufficient documentation

## 2024-01-27 DIAGNOSIS — I130 Hypertensive heart and chronic kidney disease with heart failure and stage 1 through stage 4 chronic kidney disease, or unspecified chronic kidney disease: Secondary | ICD-10-CM | POA: Insufficient documentation

## 2024-01-27 DIAGNOSIS — N189 Chronic kidney disease, unspecified: Secondary | ICD-10-CM | POA: Insufficient documentation

## 2024-01-27 DIAGNOSIS — I5030 Unspecified diastolic (congestive) heart failure: Secondary | ICD-10-CM | POA: Insufficient documentation

## 2024-01-27 DIAGNOSIS — I5032 Chronic diastolic (congestive) heart failure: Secondary | ICD-10-CM

## 2024-01-27 DIAGNOSIS — J029 Acute pharyngitis, unspecified: Secondary | ICD-10-CM | POA: Insufficient documentation

## 2024-01-27 DIAGNOSIS — J45909 Unspecified asthma, uncomplicated: Secondary | ICD-10-CM | POA: Insufficient documentation

## 2024-01-27 DIAGNOSIS — E039 Hypothyroidism, unspecified: Secondary | ICD-10-CM | POA: Insufficient documentation

## 2024-01-27 LAB — CUP PACEART REMOTE DEVICE CHECK
Date Time Interrogation Session: 20251202231821
Implantable Pulse Generator Implant Date: 20230629

## 2024-01-27 LAB — TROPONIN T, HIGH SENSITIVITY
Troponin T High Sensitivity: 33 ng/L — ABNORMAL HIGH (ref 0–19)
Troponin T High Sensitivity: 31 ng/L — ABNORMAL HIGH (ref 0–19)

## 2024-01-27 LAB — CBC
HCT: 35.9 % — ABNORMAL LOW (ref 36.0–46.0)
Hemoglobin: 11.5 g/dL — ABNORMAL LOW (ref 12.0–15.0)
MCH: 29.4 pg (ref 26.0–34.0)
MCHC: 32 g/dL (ref 30.0–36.0)
MCV: 91.8 fL (ref 80.0–100.0)
Platelets: 179 K/uL (ref 150–400)
RBC: 3.91 MIL/uL (ref 3.87–5.11)
RDW: 14.4 % (ref 11.5–15.5)
WBC: 6.3 K/uL (ref 4.0–10.5)
nRBC: 0 % (ref 0.0–0.2)

## 2024-01-27 LAB — BASIC METABOLIC PANEL WITH GFR
Anion gap: 13 (ref 5–15)
BUN: 27 mg/dL — ABNORMAL HIGH (ref 8–23)
CO2: 29 mmol/L (ref 22–32)
Calcium: 10.3 mg/dL (ref 8.9–10.3)
Chloride: 100 mmol/L (ref 98–111)
Creatinine, Ser: 1.82 mg/dL — ABNORMAL HIGH (ref 0.44–1.00)
GFR, Estimated: 28 mL/min — ABNORMAL LOW (ref >60)
Glucose, Bld: 95 mg/dL (ref 70–99)
Potassium: 3.4 mmol/L — ABNORMAL LOW (ref 3.5–5.1)
Sodium: 141 mmol/L (ref 135–145)

## 2024-01-27 LAB — GROUP A STREP BY PCR: Group A Strep by PCR: NOT DETECTED

## 2024-01-27 LAB — RESP PANEL BY RT-PCR (RSV, FLU A&B, COVID)  RVPGX2
Influenza A by PCR: NEGATIVE
Influenza B by PCR: NEGATIVE
Resp Syncytial Virus by PCR: NEGATIVE
SARS Coronavirus 2 by RT PCR: NEGATIVE

## 2024-01-27 MED ORDER — IPRATROPIUM-ALBUTEROL 0.5-2.5 (3) MG/3ML IN SOLN
3.0000 mL | Freq: Once | RESPIRATORY_TRACT | Status: AC
  Administered 2024-01-27: 3 mL via RESPIRATORY_TRACT
  Filled 2024-01-27: qty 3

## 2024-01-27 MED ORDER — LIDOCAINE VISCOUS HCL 2 % MT SOLN
15.0000 mL | Freq: Once | OROMUCOSAL | Status: AC
  Administered 2024-01-27: 15 mL via OROMUCOSAL
  Filled 2024-01-27: qty 15

## 2024-01-27 MED ORDER — DEXAMETHASONE SOD PHOSPHATE PF 10 MG/ML IJ SOLN
10.0000 mg | Freq: Once | INTRAMUSCULAR | Status: AC
  Administered 2024-01-27: 10 mg via INTRAVENOUS

## 2024-01-27 MED ORDER — LIDOCAINE VISCOUS HCL 2 % MT SOLN: 10.0000 mL | OROMUCOSAL | 0 refills | Status: AC | PRN

## 2024-01-27 NOTE — Telephone Encounter (Signed)
 FYI Only or Action Required?: FYI only for provider: ED advised.  Patient was last seen in primary care on 12/18/2023 by Lemon Raisin, MD.  Called Nurse Triage reporting Sore Throat.  Symptoms began several days ago.  Interventions attempted: Nothing.  Symptoms are: gradually worsening.  Triage Disposition: Go to ED Now (Notify PCP)  Patient/caregiver understands and will follow disposition?: Yes Reason for Disposition  SEVERE PAIN WITH DYSPHAGIA (e.g., can't swallow any liquids, or drooling)  Answer Assessment - Initial Assessment Questions Advising ED due to symptoms. Patient verbalized agreement.  1. ONSET: When did the throat start hurting? (Hours or days ago)      Monday  2. SEVERITY: How bad is the sore throat? (Scale 1-10; mild, moderate or severe)     Severe, trouble talking , unable to eat or drink water .  3. STREP EXPOSURE: Has there been any exposure to strep within the past week? If Yes, ask: What type of contact occurred?      *No Answer* 4.  VIRAL SYMPTOMS: Are there any symptoms of a cold, such as a runny nose, cough, hoarse voice or red eyes?      *No Answer* 5. FEVER: Do you have a fever? If Yes, ask: What is your temperature, how was it measured, and when did it start?     *No Answer*  7. OTHER SYMPTOMS: Do you have any other symptoms? (e.g., difficulty breathing, headache, rash)     Headaches, rib pain, nasal congestion  Protocols used: Sore Throat-A-AH  Copied from CRM #[id number]. Topic: Clinical - Red Word Triage >> Jan 27, 2024  3:06 PM Winona R wrote: throat hurt, mouth burns, headaches, ribs hurt, nose running, pt states she can barley talk and its a little dificult to speak >> Jan 27, 2024  3:30 PM Donna E wrote: Patient calling back and is frustrated wanting to talk with nurse, patient has high BP and bad headache started Monday

## 2024-01-27 NOTE — Telephone Encounter (Signed)
 Pt was told to go to ED.  KP

## 2024-01-27 NOTE — ED Triage Notes (Signed)
 Patient arrives ambulatory with rolling walker by POV c/o painful to talk and swallow with rib cage burning sensation x 1 week.

## 2024-01-27 NOTE — ED Provider Notes (Signed)
 [facility] Emergency Department Provider Note     Event Date/Time   First MD Initiated Contact with Patient 01/27/24 2011     (approximate)   History   Sore Throat and rib cage pain   HPI  Michele Moore is a 73 y.o. female with a history of HTN, asthma, osteoarthritis, dysrhythmia, HFpEF, implantable loop recorder placement, CKD, gastric ulcers, GERD, HLD and hypothyroidism presents to the ED with multiple complaints. She was told by your PCP to be evaluated in the ED for her high blood pressure. She is compliant on her BP medications at home. Also complains of, dysphagia, odynophagia and difficulty talking x 2 days ago. Denies fever and sick contacts. Complains of cough off and on for weeks with associated rhinorrhea.   Does complain of intermittent chest pain.  When further asked patient reports that this has been ongoing since her implantable loop recorder placement but in the past 2 weeks she has noticed frequent pains.  She contributes it to her arthritis.  She describes the pain to be across her collarbones and on bilateral lower rib cage.  She denies shortness of breath.     Physical Exam   Triage Vital Signs: ED Triage Vitals  Encounter Vitals Group     BP 01/27/24 1835 (!) 175/145     Girls Systolic BP Percentile --      Girls Diastolic BP Percentile --      Boys Systolic BP Percentile --      Boys Diastolic BP Percentile --      Pulse Rate 01/27/24 1835 (!) 57     Resp 01/27/24 1835 20     Temp 01/27/24 1835 98.6 F (37 C)     Temp src --      SpO2 01/27/24 1835 97 %     Weight 01/27/24 1834 279 lb (126.6 kg)     Height 01/27/24 1834 5' 11 (1.803 m)     Head Circumference --      Peak Flow --      Pain Score 01/27/24 1834 10     Pain Loc --      Pain Education --      Exclude from Growth Chart --     Most recent vital signs: Vitals:   01/27/24 1835  BP: (!) 175/145  Pulse: (!) 57  Resp: 20  Temp: 98.6 F (37 C)  SpO2:  97%    General: Nontoxic-appearing.  Alert and oriented. INAD.  Speaking complete sentences.  No muffled sounding voice.      Head:  NCAT.  Eyes:  PERRLA. EOMI.  Nose:   Mucosa is moist. Positive rhinorrhea. Throat: Oropharynx clear.  Erythema noted to the hard palate.  Absent tonsils.  Uvula is midline.   CV:  Good peripheral perfusion. RRR.  RESP:  Normal effort. LCTAB. No retractions.  ABD:  No distention. Soft, Non tender. No masses or organomegaly. No CVA tenderness bilaterally.  BACK:  Spinous process is midline without deformity or tenderness. MSK:   Full ROM in all joints. No swelling, deformity or tenderness.  NEURO: Cranial nerves II-XII intact. No focal deficits. Speech clear. Sensation and motor function intact. Normal muscle strength of UE & LE. Gait is steady.       ED Results / Procedures / Treatments   Labs (all labs ordered are listed, but only abnormal results are displayed) Labs Reviewed  RESP PANEL BY RT-PCR (RSV, FLU A&B, COVID)  RVPGX2  GROUP  A STREP BY PCR    EKG  ***  RADIOLOGY  {**I personally viewed and evaluated these images as part of my medical decision making, as well as reviewing the written report by the radiologist.  ED Provider Interpretation: ***  DG Chest 2 View Result Date: 01/27/2024 CLINICAL DATA:  burning pain EXAM: CHEST - 2 VIEW COMPARISON:  10/19/2023 FINDINGS: Low lung volumes. No focal airspace consolidation, pleural effusion, or pneumothorax. Mild cardiomegaly.Tortuous aorta with aortic atherosclerosis. Cardiac loop recorder device again noted. No acute fracture or destructive lesion. Multilevel thoracic osteophytosis. IMPRESSION: Mild cardiomegaly. No pneumonia or pulmonary edema. Electronically Signed   By: Rogelia Myers M.D.   On: 01/27/2024 19:41    PROCEDURES:  Critical Care performed: {CriticalCareYesNo:19197::Yes, see critical care procedure note(s),No}  Procedures   MEDICATIONS ORDERED IN ED: Medications - No  data to display   IMPRESSION / MDM / ASSESSMENT AND PLAN / ED COURSE  I reviewed the triage vital signs and the nursing notes.                                 73 y.o. female presents to the emergency department for evaluation and treatment of ***. See HPI for further details.   Differential diagnosis includes, but is not limited to ***   Patient's presentation is most consistent with {EM COPA:27473}  {**The patient is on the cardiac monitor to evaluate for evidence of arrhythmia and/or significant heart rate changes.**}  The patient is in stable and satisfactory condition for discharge home. Encouraged to follow up with *** for further management. ED precautions discussed. All questions and concerns were addressed during this ED visit.     FINAL CLINICAL IMPRESSION(S) / ED DIAGNOSES   Final diagnoses:  None     Rx / DC Orders   ED Discharge Orders     None        Note:  This document was prepared using Dragon voice recognition software and may include unintentional dictation errors.

## 2024-01-28 MED ORDER — ACETAMINOPHEN 500 MG PO TABS
1000.0000 mg | ORAL_TABLET | Freq: Once | ORAL | Status: AC
  Administered 2024-01-28: 1000 mg via ORAL
  Filled 2024-01-28: qty 2

## 2024-01-28 NOTE — ED Provider Notes (Incomplete)
 [facility] Emergency Department Provider Note     Event Date/Time   First MD Initiated Contact with Patient 01/27/24 2011     (approximate)   History   Sore Throat and rib cage pain   HPI  Michele Moore is a 73 y.o. female with a history of HTN, asthma, osteoarthritis, dysrhythmia, HFpEF, implantable loop recorder placement, CKD, gastric ulcers, GERD, HLD and hypothyroidism presents to the ED with multiple complaints. She was told by your PCP to be evaluated in the ED for her high blood pressure. She is compliant on her BP medications at home. Also complains of, dysphagia, odynophagia and difficulty talking x 2 days ago. Denies fever and sick contacts. Complains of cough off and on for weeks with associated rhinorrhea.   Does complain of intermittent chest pain.  When further asked patient reports that this has been ongoing since her implantable loop recorder placement but in the past 2 weeks she has noticed frequent pains.  Pain is worse with movement.  She contributes it to her arthritis.  She describes the pain to be across her collarbones and on bilateral lower rib cage.  She denies shortness of breath.  No abdominal pain     Physical Exam   Triage Vital Signs: ED Triage Vitals  Encounter Vitals Group     BP 01/27/24 1835 (!) 175/145     Girls Systolic BP Percentile --      Girls Diastolic BP Percentile --      Boys Systolic BP Percentile --      Boys Diastolic BP Percentile --      Pulse Rate 01/27/24 1835 (!) 57     Resp 01/27/24 1835 20     Temp 01/27/24 1835 98.6 F (37 C)     Temp src --      SpO2 01/27/24 1835 97 %     Weight 01/27/24 1834 279 lb (126.6 kg)     Height 01/27/24 1834 5' 11 (1.803 m)     Head Circumference --      Peak Flow --      Pain Score 01/27/24 1834 10     Pain Loc --      Pain Education --      Exclude from Growth Chart --     Most recent vital signs: Vitals:   01/27/24 1835  BP: (!) 175/145  Pulse: (!)  57  Resp: 20  Temp: 98.6 F (37 C)  SpO2: 97%    General: Nontoxic-appearing.  Alert and oriented. INAD.  Speaking complete sentences.  No muffled sounding voice.      Head:  NCAT.  Eyes:  PERRLA. EOMI.  Nose:   Mucosa is moist. Positive rhinorrhea. Throat: Oropharynx clear.  Erythema noted to the hard palate.  Absent tonsils.  Uvula is midline.   CV:  Good peripheral perfusion. RRR.  RESP:  Normal effort.  Mild expiratory wheezing heard bilaterally. ABD:  No distention. Soft, Non tender.  NEURO: Cranial nerves intact. No focal deficits.    ED Results / Procedures / Treatments   Labs (all labs ordered are listed, but only abnormal results are displayed) Labs Reviewed  RESP PANEL BY RT-PCR (RSV, FLU A&B, COVID)  RVPGX2  GROUP A STREP BY PCR   EKG  Sinus bradycardia with marked sinus arrhythmia with occasional premature ventricular complexes  RADIOLOGY  I personally viewed and evaluated these images as part of my medical decision making, as well as reviewing the  written report by the radiologist.  DG Chest 2 View Result Date: 01/27/2024 CLINICAL DATA:  burning pain EXAM: CHEST - 2 VIEW COMPARISON:  10/19/2023 FINDINGS: Low lung volumes. No focal airspace consolidation, pleural effusion, or pneumothorax. Mild cardiomegaly.Tortuous aorta with aortic atherosclerosis. Cardiac loop recorder device again noted. No acute fracture or destructive lesion. Multilevel thoracic osteophytosis. IMPRESSION: Mild cardiomegaly. No pneumonia or pulmonary edema. Electronically Signed   By: Rogelia Myers M.D.   On: 01/27/2024 19:41    PROCEDURES:  Critical Care performed: No  Procedures   MEDICATIONS ORDERED IN ED: Medications - No data to display   IMPRESSION / MDM / ASSESSMENT AND PLAN / ED COURSE  I reviewed the triage vital signs and the nursing notes.                              Clinical Course as of 01/28/24 0000  Wed Jan 27, 2024  2347 Patient evaluated in coordination with  treating APP.  Primarily here for URI symptoms of nasal congestion, sore throat, cough.  Some decreased appetite as well.  Does feel some chest discomfort with coughing, no hemoptysis, no pleuritic discomfort.  Very well-appearing on my eval.  Satting well on room air with normal work of breathing.  No fever.  Presentation overall consistent with URI.  Serial troponins stable, no prior for comparison with this troponin assay.  EKG nonischemic.  Clinical history not particularly suggestive of cardiac etiology.  Oropharyngeal exam with mild tonsillar erythema, no concern for deep neck space infection on my eval.  Strep swab negative.  Viral swab negative.  Will give a dose of Decadron  and viscous lidocaine .  Encouraged her to continue with Tylenol  at home and follow-up with her primary care provider.  Will also Rx viscous lidocaine .  Plan for discharge home. [MM]    Clinical Course User Index [MM] Clarine Ozell LABOR, MD    73 y.o. female presents to the emergency department for evaluation and treatment of sore throat/URI. See HPI for further details.   Differential diagnosis includes, but is not limited to strep pharyngitis, viral pharyngitis, viral URI, ACS, CHF exacerbation, rhinitis, GERD  Patient's presentation is most consistent with acute complicated illness / injury requiring diagnostic workup.  {**The patient is on the cardiac monitor to evaluate for evidence of arrhythmia and/or significant heart rate changes.**}  The patient is in stable and satisfactory condition for discharge home. Encouraged to follow up with *** for further management. ED precautions discussed. All questions and concerns were addressed during this ED visit.     FINAL CLINICAL IMPRESSION(S) / ED DIAGNOSES   Final diagnoses:  None     Rx / DC Orders   ED Discharge Orders     None        Note:  This document was prepared using Dragon voice recognition software and may include unintentional dictation  errors.

## 2024-01-28 NOTE — Discharge Instructions (Addendum)
 Please follow-up with your primary care provider in 3 days. if any new or worsening symptoms occur return to ED for further evaluation.  Your respiratory panel which includes COVID, influenza and RSV is negative.  Your rapid strep test is negative.  Your lab work is reassuring.  Your chest x-ray is shows no acute findings.  We believe your presentation is consistent with an upper respiratory and faction which is causing postnasal drip to irritate the lining of your throat.  We have prescribed lidocaine  viscous for symptomatic control.  Please take as instructed.  Follow-up with your primary care.

## 2024-01-29 ENCOUNTER — Telehealth: Payer: Self-pay

## 2024-01-29 NOTE — Transitions of Care (Post Inpatient/ED Visit) (Signed)
 01/29/2024  Name: Michele Moore MRN: [id number] DOB: 1951-02-05  Today's TOC FU Call Status: Today's TOC FU Call Status:: Successful TOC FU Call Completed TOC FU Call Complete Date: 01/28/24  Patient's Name and Date of Birth confirmed. DOB, Name  Transition Care Management Follow-up Telephone Call Date of Discharge: 01/28/24 Discharge Facility: [facility]) Type of Discharge: Emergency Department Reason for ED Visit: Other: How have you been since you were released from the hospital?: Same Any questions or concerns?: No  Items Reviewed: Did you receive and understand the discharge instructions provided?: No Medications obtained,verified, and reconciled?: Yes (Medications Reviewed) Any new allergies since your discharge?: No Dietary orders reviewed?: No Do you have support at home?: No  Medications Reviewed Today: Medications Reviewed Today     Reviewed by Camacho Ocampo, Tosca Pletz M, CMA (Certified Medical Assistant) on 01/29/24 at 1620  Med List Status: <None>   Medication Order Taking? Sig Documenting Provider Last Dose Status Informant  acetaminophen  (TYLENOL ) 650 MG CR tablet [id number] Yes Take 650 mg by mouth every 8 (eight) hours as needed for pain. [provider]  Active Self, Pharmacy Records  albuterol  (VENTOLIN  HFA) 108 (90 Base) MCG/ACT inhaler [id number] Yes Inhale 2 puffs into the lungs every 6 (six) hours as needed for wheezing or shortness of breath. Floy Roberts, MD  Active Self, Pharmacy Records  allopurinol  (ZYLOPRIM ) 100 MG tablet [id number] Yes Take 200 mg by mouth every morning.  [provider]  Active Self, Pharmacy Records  atorvastatin  (LIPITOR) 40 MG tablet [id number] Yes Take 1 tablet (40 mg total) by mouth daily. Lemon Raisin, MD  Active   betamethasone  dipropionate 0.05 % cream [id number] Yes APPLY TO PSORIASIS ON HANDS TWICE DAILY ONLY. AVOID FACE, GROIN AND AXILLA Kowalski, David C, MD  Active Self,  Pharmacy Records  butalbital -acetaminophen -caffeine  (FIORICET ) 50-325-40 MG tablet [id number] Yes Take 1 tablet by mouth every 6 (six) hours as needed for headache. Cleaster Tinnie LABOR, PA-C  Active   cetirizine (ZYRTEC) 10 MG tablet [id number] Yes Take 10 mg by mouth daily. [provider]  Active Self, Pharmacy Records  Cholecalciferol  (VITAMIN D3) 2000 units TABS [id number] Yes Take 2,000 Units by mouth daily. [provider]  Active Self, Pharmacy Records  fluconazole  (DIFLUCAN ) 200 MG tablet [id number] Yes Take 200 mg by mouth once a week. [provider]  Active Self, Pharmacy Records    Discontinued 04/24/19 1340   furosemide  (LASIX ) 40 MG tablet [id number] Yes Take 1 tablet (40 mg total) by mouth 2 (two) times daily. Lemon Raisin, MD  Active   gabapentin  (NEURONTIN ) 600 MG tablet [id number] Yes Take 1 tablet (600 mg total) by mouth at bedtime. Patel, Seema K, NP  Active   HYDROcodone -acetaminophen  (NORCO) 7.5-325 MG tablet [id number] Yes Take 1 tablet by mouth every 8 (eight) hours as needed for moderate pain (pain score 4-6). Must last 30 days Patel, Seema K, NP  Active   HYDROcodone -acetaminophen  (NORCO) 7.5-325 MG tablet [id number] Yes Take 1 tablet by mouth every 8 (eight) hours as needed for moderate pain (pain score 4-6). Must last 30 days Patel, Seema K, NP  Active   HYDROcodone -acetaminophen  (NORCO) 7.5-325 MG tablet [id number] Yes Take 1 tablet by mouth every 8 (eight) hours as needed for moderate pain (pain score 4-6). Must last 30 days Patel, Seema K, NP  Active   hydrocortisone  2.5 % ointment [id number] Yes Apply topically 2 (two) times daily as needed. Lemon Raisin, MD  Active   hydrOXYzine  (ATARAX )  25 MG tablet [id number] Yes Take 25 mg by mouth every 8 (eight) hours as needed. [provider]  Active Self, Pharmacy Records  JARDIANCE  10 MG TABS tablet [id number] Yes Take 10 mg by mouth every morning. [provider]  Active Self, Pharmacy  Records  levothyroxine  (SYNTHROID ) 100 MCG tablet [id number] Yes Take 1 tablet (100 mcg total) by mouth daily at 6 (six) AM. Lemon Raisin, MD  Active   lidocaine  (XYLOCAINE ) 2 % solution [id number] Yes Use as directed 10 mLs in the mouth or throat every 4 (four) hours as needed for mouth pain. Swish, gargle, and spit Margrette, Myah A, PA-C  Active   losartan  (COZAAR ) 25 MG tablet [id number] Yes Take 25 mg by mouth daily. [provider]  Active Self, Pharmacy Records  metoprolol  tartrate (LOPRESSOR ) 25 MG tablet [id number] Yes Take 1 tablet (25 mg total) by mouth 2 (two) times daily. Lemon Raisin, MD  Active     Discontinued 04/24/19 1340   montelukast  (SINGULAIR ) 10 MG tablet [id number] Yes TAKE 1 TABLET(10 MG) BY MOUTH AT BEDTIME Lemon Raisin, MD  Active   Multiple Vitamins-Calcium  (ONE-A-DAY WOMENS FORMULA PO) [id number] Yes Take 1 tablet by mouth daily. [provider]  Active Self, Pharmacy Records  omega-3 acid ethyl esters (LOVAZA ) 1 g capsule [id number] Yes Take 1 capsule (1 g total) by mouth 2 (two) times daily. Lemon Raisin, MD  Active   omeprazole  (PRILOSEC) 40 MG capsule [id number] Yes TAKE 1 CAPSULE(40 MG) BY MOUTH DAILY Jones, Deanna C, MD  Active Self, Pharmacy Records  tiZANidine  (ZANAFLEX ) 4 MG tablet [id number] Yes Take 1 tablet (4 mg total) by mouth at bedtime. Marcelino Nurse, MD  Active Self, Pharmacy Records  TREMFYA ONE-PRESS 100 MG/ML NELMA [id number] Yes INJECT 100MG  SUBCUTANEOUSLY AT WEEK 0 AND WEEK 4 AS DIRECTED. [provider]  Active   triamcinolone  (KENALOG ) 0.1 % [id number] Yes Apply 1 application topically 2 (two) times daily. Joshua Cathryne BROCKS, MD  Active Self, Pharmacy Records  Med List Note Wendelyn Pulling, RN 07/09/23 1520): UDS 07/09/23 MR 10/18/23 Medication agreement signed 10/28/2016            Home Care and Equipment/Supplies: Were Home Health Services Ordered?: NA Any new equipment or medical supplies ordered?: NA  Functional  Questionnaire: Do you need assistance with bathing/showering or dressing?: No Do you need assistance with meal preparation?: No Do you need assistance with eating?: No Do you have difficulty maintaining continence: No Do you need assistance with getting out of bed/getting out of a chair/moving?: No Do you have difficulty managing or taking your medications?: No  Follow up appointments reviewed: PCP Follow-up appointment confirmed?: No MD Provider Line Number:[phone] Given: No Specialist Hospital Follow-up appointment confirmed?: No Do you need transportation to your follow-up appointment?: No Do you understand care options if your condition(s) worsen?: Yes-patient verbalized understanding    Dorea Duff Kristie Harsh, CMA  [facility] at [facility] [address] [place], [place] [address] Office: [phone] Fax: [phone]

## 2024-02-02 NOTE — Progress Notes (Signed)
 Remote Loop Recorder Transmission

## 2024-02-03 ENCOUNTER — Inpatient Hospital Stay: Attending: Oncology

## 2024-02-03 DIAGNOSIS — D631 Anemia in chronic kidney disease: Secondary | ICD-10-CM | POA: Insufficient documentation

## 2024-02-03 DIAGNOSIS — N1832 Chronic kidney disease, stage 3b: Secondary | ICD-10-CM | POA: Diagnosis present

## 2024-02-03 LAB — HEMOGLOBIN AND HEMATOCRIT (CANCER CENTER ONLY)
HCT: 36.2 % (ref 36.0–46.0)
Hemoglobin: 11.7 g/dL — ABNORMAL LOW (ref 12.0–15.0)

## 2024-02-21 ENCOUNTER — Ambulatory Visit: Payer: Self-pay | Admitting: Cardiology

## 2024-02-23 ENCOUNTER — Other Ambulatory Visit: Payer: Self-pay

## 2024-02-23 DIAGNOSIS — K219 Gastro-esophageal reflux disease without esophagitis: Secondary | ICD-10-CM

## 2024-02-23 MED ORDER — OMEPRAZOLE 40 MG PO CPDR: DELAYED_RELEASE_CAPSULE | ORAL | 1 refills | Status: AC

## 2024-02-24 ENCOUNTER — Ambulatory Visit

## 2024-02-25 ENCOUNTER — Encounter

## 2024-02-27 ENCOUNTER — Ambulatory Visit: Attending: Cardiology

## 2024-02-27 DIAGNOSIS — I5032 Chronic diastolic (congestive) heart failure: Secondary | ICD-10-CM | POA: Diagnosis not present

## 2024-02-29 LAB — CUP PACEART REMOTE DEVICE CHECK
Date Time Interrogation Session: 20260102231538
Implantable Pulse Generator Implant Date: 20230629

## 2024-03-03 ENCOUNTER — Other Ambulatory Visit: Payer: Self-pay | Admitting: Student

## 2024-03-03 NOTE — Progress Notes (Signed)
 Remote Loop Recorder Transmission

## 2024-03-04 NOTE — Telephone Encounter (Signed)
 Requested Prescriptions  Pending Prescriptions Disp Refills   levothyroxine  (SYNTHROID ) 100 MCG tablet [Pharmacy Med Name: LEVOTHYROXINE  0.100MG  ( ) TAB] 90 tablet 0    Sig: TAKE 1 TABLET(100 MCG) BY MOUTH DAILY AT 6 AM     Endocrinology:  Hypothyroid Agents Failed - 03/04/2024  8:58 AM      Failed - TSH in normal range and within 360 days    TSH  Date Value Ref Range Status  12/18/2023 9.330 (H) 0.450 - 4.500 uIU/mL Final         Passed - Valid encounter within last 12 months    Recent Outpatient Visits           2 months ago Hypertension, unspecified type   [facility] at [facility], MD   5 months ago Dysuria   [facility] at [facility], MD   11 months ago Hypertension, unspecified type   [facility] at [facility] Joshua Cathryne JAYSON, MD

## 2024-03-08 ENCOUNTER — Inpatient Hospital Stay: Attending: Oncology

## 2024-03-08 ENCOUNTER — Inpatient Hospital Stay

## 2024-03-08 DIAGNOSIS — N1832 Chronic kidney disease, stage 3b: Secondary | ICD-10-CM

## 2024-03-08 LAB — HEMOGLOBIN AND HEMATOCRIT (CANCER CENTER ONLY)
HCT: 38 % (ref 36.0–46.0)
Hemoglobin: 12 g/dL (ref 12.0–15.0)

## 2024-03-08 NOTE — Progress Notes (Signed)
 Hemoglobin 12.0 today. No injection given

## 2024-03-09 ENCOUNTER — Inpatient Hospital Stay

## 2024-03-09 ENCOUNTER — Inpatient Hospital Stay: Attending: Oncology

## 2024-03-13 ENCOUNTER — Ambulatory Visit: Payer: Self-pay | Admitting: Cardiology

## 2024-03-26 ENCOUNTER — Ambulatory Visit

## 2024-03-29 ENCOUNTER — Ambulatory Visit

## 2024-03-29 ENCOUNTER — Ambulatory Visit (HOSPITAL_BASED_OUTPATIENT_CLINIC_OR_DEPARTMENT_OTHER): Admitting: Nurse Practitioner

## 2024-03-29 DIAGNOSIS — G894 Chronic pain syndrome: Secondary | ICD-10-CM

## 2024-03-29 DIAGNOSIS — Z91199 Patient's noncompliance with other medical treatment and regimen due to unspecified reason: Secondary | ICD-10-CM

## 2024-03-29 LAB — CUP PACEART REMOTE DEVICE CHECK
Date Time Interrogation Session: 20260202231708
Implantable Pulse Generator Implant Date: 20230629

## 2024-04-07 ENCOUNTER — Encounter

## 2024-04-20 ENCOUNTER — Other Ambulatory Visit

## 2024-04-21 ENCOUNTER — Ambulatory Visit

## 2024-04-21 ENCOUNTER — Ambulatory Visit: Admitting: Oncology

## 2024-04-22 ENCOUNTER — Ambulatory Visit: Admitting: Student

## 2024-04-29 ENCOUNTER — Ambulatory Visit

## 2024-05-30 ENCOUNTER — Ambulatory Visit

## 2024-06-30 ENCOUNTER — Ambulatory Visit

## 2024-07-31 ENCOUNTER — Ambulatory Visit

## 2024-08-31 ENCOUNTER — Ambulatory Visit

## 2024-10-01 ENCOUNTER — Ambulatory Visit

## 2024-11-01 ENCOUNTER — Ambulatory Visit

## 2024-12-02 ENCOUNTER — Ambulatory Visit

## 2025-01-02 ENCOUNTER — Ambulatory Visit

## 2025-02-02 ENCOUNTER — Ambulatory Visit

## 2025-03-05 ENCOUNTER — Ambulatory Visit
